# Patient Record
Sex: Male | Born: 1955 | Race: Black or African American | Hispanic: No | Smoking: Former smoker
Health system: Southern US, Community
[De-identification: ages and names within clinical notes are randomized; demographics above are authoritative.]

## PROBLEM LIST (undated history)

## (undated) DIAGNOSIS — E079 Disorder of thyroid, unspecified: Secondary | ICD-10-CM

## (undated) DIAGNOSIS — I639 Cerebral infarction, unspecified: Secondary | ICD-10-CM

## (undated) DIAGNOSIS — C801 Malignant (primary) neoplasm, unspecified: Secondary | ICD-10-CM

## (undated) DIAGNOSIS — N189 Chronic kidney disease, unspecified: Secondary | ICD-10-CM

## (undated) DIAGNOSIS — D649 Anemia, unspecified: Secondary | ICD-10-CM

## (undated) DIAGNOSIS — I10 Essential (primary) hypertension: Secondary | ICD-10-CM

## (undated) DIAGNOSIS — R569 Unspecified convulsions: Secondary | ICD-10-CM

## (undated) DIAGNOSIS — E119 Type 2 diabetes mellitus without complications: Secondary | ICD-10-CM

## (undated) DIAGNOSIS — K219 Gastro-esophageal reflux disease without esophagitis: Secondary | ICD-10-CM

## (undated) HISTORY — DX: Essential (primary) hypertension: I10

## (undated) HISTORY — DX: Chronic kidney disease, unspecified: N18.9

## (undated) HISTORY — DX: Unspecified convulsions: R56.9

## (undated) HISTORY — DX: Type 2 diabetes mellitus without complications: E11.9

## (undated) HISTORY — DX: Disorder of thyroid, unspecified: E07.9

## (undated) HISTORY — DX: Cerebral infarction, unspecified: I63.9

## (undated) HISTORY — DX: Anemia, unspecified: D64.9

---

## 2014-04-27 HISTORY — PX: INSERTION OF DIALYSIS CATHETER: SHX1324

## 2014-05-06 ENCOUNTER — Other Ambulatory Visit: Payer: Self-pay | Admitting: *Deleted

## 2014-05-06 DIAGNOSIS — Z01810 Encounter for preprocedural cardiovascular examination: Secondary | ICD-10-CM

## 2014-05-06 DIAGNOSIS — N186 End stage renal disease: Secondary | ICD-10-CM

## 2014-05-11 ENCOUNTER — Encounter: Payer: Self-pay | Admitting: Vascular Surgery

## 2014-05-12 ENCOUNTER — Ambulatory Visit (INDEPENDENT_AMBULATORY_CARE_PROVIDER_SITE_OTHER)
Admission: RE | Admit: 2014-05-12 | Discharge: 2014-05-12 | Disposition: A | Discharge status: Home / Self Care | Payer: No Typology Code available for payment source | Source: Ambulatory Visit | Attending: Vascular Surgery | Admitting: Vascular Surgery

## 2014-05-12 ENCOUNTER — Ambulatory Visit (INDEPENDENT_AMBULATORY_CARE_PROVIDER_SITE_OTHER): Payer: Medicaid Other | Admitting: Vascular Surgery

## 2014-05-12 ENCOUNTER — Ambulatory Visit (HOSPITAL_COMMUNITY)
Admission: RE | Admit: 2014-05-12 | Discharge: 2014-05-12 | Disposition: A | Discharge status: Home / Self Care | Payer: No Typology Code available for payment source | Source: Ambulatory Visit | Attending: Vascular Surgery | Admitting: Vascular Surgery

## 2014-05-12 ENCOUNTER — Encounter: Payer: Self-pay | Admitting: Vascular Surgery

## 2014-05-12 VITALS — BP 159/80 | HR 73 | Resp 16 | Ht 69.0 in | Wt 180.0 lb

## 2014-05-12 DIAGNOSIS — N186 End stage renal disease: Secondary | ICD-10-CM

## 2014-05-12 DIAGNOSIS — Z01810 Encounter for preprocedural cardiovascular examination: Secondary | ICD-10-CM

## 2014-05-12 NOTE — Progress Notes (Signed)
Subjective:     Patient ID: Jared [name], male   DOB: Jan 23, 1956, 58 y.o.   MRN: [id number]  HPI this 58-year-old male was referred by Dr. Jamal Maes for evaluation for vascular access. He is right-handed. He is on hemodialysis and has been on dialysis for about 2-3 weeks. Catheter was inserted in [place]. He has never had vascular access in the past. He does have diabetes and a history of hypertension.  Past Medical History  Diagnosis Date  . Chronic kidney disease   . Diabetes mellitus without complication   . Hypertension   . Anemia   . Thyroid disease     hyperparathyroidism    History  Substance Use Topics  . Smoking status: Former Smoker    Quit date: 05/11/1984  . Smokeless tobacco: Not on file  . Alcohol Use: Not on file    No family history on file.  No Known Allergies  Current outpatient prescriptions:amLODipine (NORVASC) 10 MG tablet, Take 10 mg by mouth daily., Disp: , Rfl: ;  hydrALAZINE (APRESOLINE) 50 MG tablet, Take 50 mg by mouth 3 (three) times daily., Disp: , Rfl: ;  Insulin NPH Human, Isophane, (NOVOLIN N ), Inject into the skin., Disp: , Rfl: ;  insulin regular (NOVOLIN R,HUMULIN R) 100 units/mL injection, Inject 30 Units into the skin 2 (two) times daily before a meal., Disp: , Rfl:  metoprolol succinate (TOPROL-XL) 100 MG 24 hr tablet, Take 100 mg by mouth 2 (two) times daily. Take with or immediately following a meal., Disp: , Rfl:   BP 159/80  Pulse 73  Resp 16  Ht 5\' 9"  (QA348G m)  Wt 180 lb (81.647 kg)  BMI 26.57 kg/m2  Body mass index is 26.57 kg/(m^2).           Review of Systems denies chest pain, dyspnea on exertion, PND, orthopnea, hemoptysis, claudication. All systems negative and complete review of systems    Objective:   Physical Exam BP 159/80  Pulse 73  Resp 16  Ht 5\' 9"  (1.753 m)  Wt 180 lb (81.647 kg)  BMI 26.57 kg/m2  Gen.-alert and oriented x3 in no apparent distress HEENT normal for age Lungs no  rhonchi or wheezing Cardiovascular regular rhythm no murmurs carotid pulses 3+ palpable no bruits audible Abdomen soft nontender no palpable masses Musculoskeletal free of  major deformities Skin clear -no rashes Neurologic normal Lower extremities 3+ femoral and dorsalis pedis pulses palpable bilaterally with no edema  Today I ordered bilateral upper extremity vein mapping which are reviewed and interpreted. Arterial system is excellent. It appears that his cephalic vein in the left arm is adequate in the upper arm and possibly adequate in the forearm for radial or brachial cephalic AV fistula     Assessment:     End-stage renal disease on hemodialysis the right IJ catheter     Plan:     We'll schedule for left upper trinity AV fistula-either radial cephalic or brachial cephalic on Thursday, June 25. Discussed this with patient and he would like to proceed

## 2014-05-13 ENCOUNTER — Other Ambulatory Visit: Payer: Self-pay | Admitting: *Deleted

## 2014-06-03 ENCOUNTER — Encounter (HOSPITAL_COMMUNITY): Payer: Self-pay | Admitting: *Deleted

## 2014-06-03 MED ORDER — DEXTROSE 5 % IV SOLN
1.5000 g | INTRAVENOUS | Status: AC
  Administered 2014-06-04: 1.5 g via INTRAVENOUS
  Filled 2014-06-03: qty 1.5

## 2014-06-03 MED ORDER — SODIUM CHLORIDE 0.9 % IV SOLN: INTRAVENOUS | Status: DC

## 2014-06-03 NOTE — Progress Notes (Signed)
Pt states he thinks he had a CXR and EKG done in past year, but unsure of name of hospital in [place], [place].

## 2014-06-04 ENCOUNTER — Ambulatory Visit (HOSPITAL_COMMUNITY): Payer: Medicaid Other

## 2014-06-04 ENCOUNTER — Encounter (HOSPITAL_COMMUNITY)
Admission: RE | Disposition: A | Discharge status: Home / Self Care | Payer: Self-pay | Source: Ambulatory Visit | Attending: Vascular Surgery

## 2014-06-04 ENCOUNTER — Ambulatory Visit (HOSPITAL_COMMUNITY): Payer: Medicaid Other | Admitting: Anesthesiology

## 2014-06-04 ENCOUNTER — Ambulatory Visit (HOSPITAL_COMMUNITY)
Admission: RE | Admit: 2014-06-04 | Discharge: 2014-06-04 | Disposition: A | Discharge status: Home / Self Care | Payer: Medicaid Other | Source: Ambulatory Visit | Attending: Vascular Surgery | Admitting: Vascular Surgery

## 2014-06-04 ENCOUNTER — Other Ambulatory Visit: Payer: Self-pay

## 2014-06-04 ENCOUNTER — Encounter (HOSPITAL_COMMUNITY): Payer: Self-pay | Admitting: Anesthesiology

## 2014-06-04 ENCOUNTER — Encounter (HOSPITAL_COMMUNITY): Payer: Medicaid Other | Admitting: Anesthesiology

## 2014-06-04 DIAGNOSIS — N185 Chronic kidney disease, stage 5: Secondary | ICD-10-CM | POA: Diagnosis not present

## 2014-06-04 DIAGNOSIS — N186 End stage renal disease: Secondary | ICD-10-CM | POA: Diagnosis not present

## 2014-06-04 DIAGNOSIS — Z87891 Personal history of nicotine dependence: Secondary | ICD-10-CM | POA: Insufficient documentation

## 2014-06-04 DIAGNOSIS — Z794 Long term (current) use of insulin: Secondary | ICD-10-CM | POA: Insufficient documentation

## 2014-06-04 DIAGNOSIS — I120 Hypertensive chronic kidney disease with stage 5 chronic kidney disease or end stage renal disease: Secondary | ICD-10-CM | POA: Insufficient documentation

## 2014-06-04 DIAGNOSIS — E119 Type 2 diabetes mellitus without complications: Secondary | ICD-10-CM | POA: Insufficient documentation

## 2014-06-04 DIAGNOSIS — Z48812 Encounter for surgical aftercare following surgery on the circulatory system: Secondary | ICD-10-CM

## 2014-06-04 DIAGNOSIS — Z992 Dependence on renal dialysis: Secondary | ICD-10-CM | POA: Insufficient documentation

## 2014-06-04 HISTORY — PX: AV FISTULA PLACEMENT: SHX1204

## 2014-06-04 LAB — POCT I-STAT 4, (NA,K, GLUC, HGB,HCT)
Glucose, Bld: 393 mg/dL — ABNORMAL HIGH (ref 70–99)
HCT: 40 % (ref 39.0–52.0)
Hemoglobin: 13.6 g/dL (ref 13.0–17.0)
Potassium: 4.9 mEq/L (ref 3.7–5.3)
SODIUM: 134 meq/L — AB (ref 137–147)

## 2014-06-04 LAB — GLUCOSE, CAPILLARY
Glucose-Capillary: 389 mg/dL — ABNORMAL HIGH (ref 70–99)
Glucose-Capillary: 389 mg/dL — ABNORMAL HIGH (ref 70–99)

## 2014-06-04 SURGERY — ARTERIOVENOUS (AV) FISTULA CREATION
Anesthesia: Monitor Anesthesia Care | Site: Arm Upper | Laterality: Left

## 2014-06-04 MED ORDER — DEXTROSE 5 % IV SOLN
INTRAVENOUS | Status: DC | PRN
  Administered 2014-06-04: 08:00:00 via INTRAVENOUS

## 2014-06-04 MED ORDER — SODIUM CHLORIDE 0.9 % IV SOLN
INTRAVENOUS | Status: DC | PRN
  Administered 2014-06-04: 07:00:00 via INTRAVENOUS

## 2014-06-04 MED ORDER — INSULIN ASPART 100 UNIT/ML ~~LOC~~ SOLN
5.0000 [IU] | Freq: Once | SUBCUTANEOUS | Status: DC
  Filled 2014-06-04: qty 0.05

## 2014-06-04 MED ORDER — METOCLOPRAMIDE HCL 5 MG/ML IJ SOLN: 10.0000 mg | Freq: Once | INTRAMUSCULAR | Status: DC | PRN

## 2014-06-04 MED ORDER — ONDANSETRON HCL 4 MG/2ML IJ SOLN
INTRAMUSCULAR | Status: AC
  Filled 2014-06-04: qty 2

## 2014-06-04 MED ORDER — OXYCODONE HCL 5 MG/5ML PO SOLN: 5.0000 mg | Freq: Once | ORAL | Status: DC | PRN

## 2014-06-04 MED ORDER — FENTANYL CITRATE 0.05 MG/ML IJ SOLN
INTRAMUSCULAR | Status: DC | PRN
  Administered 2014-06-04 (×2): 25 ug via INTRAVENOUS
  Administered 2014-06-04 (×2): 50 ug via INTRAVENOUS

## 2014-06-04 MED ORDER — MIDAZOLAM HCL 5 MG/5ML IJ SOLN
INTRAMUSCULAR | Status: DC | PRN
  Administered 2014-06-04: 1 mg via INTRAVENOUS
  Administered 2014-06-04 (×2): 0.5 mg via INTRAVENOUS

## 2014-06-04 MED ORDER — INSULIN ASPART 100 UNIT/ML ~~LOC~~ SOLN
SUBCUTANEOUS | Status: AC
  Filled 2014-06-04: qty 15

## 2014-06-04 MED ORDER — OXYCODONE HCL 5 MG PO TABS: 5.0000 mg | ORAL_TABLET | Freq: Once | ORAL | Status: DC | PRN

## 2014-06-04 MED ORDER — INSULIN ASPART 100 UNIT/ML ~~LOC~~ SOLN
15.0000 [IU] | Freq: Once | SUBCUTANEOUS | Status: AC
  Administered 2014-06-04: 15 [IU] via SUBCUTANEOUS

## 2014-06-04 MED ORDER — PROPOFOL INFUSION 10 MG/ML OPTIME
INTRAVENOUS | Status: DC | PRN
  Administered 2014-06-04: 25 ug/kg/min via INTRAVENOUS

## 2014-06-04 MED ORDER — CHLORHEXIDINE GLUCONATE CLOTH 2 % EX PADS: 6.0000 | MEDICATED_PAD | Freq: Once | CUTANEOUS | Status: DC

## 2014-06-04 MED ORDER — OXYCODONE HCL 5 MG PO TABS: 5.0000 mg | ORAL_TABLET | Freq: Four times a day (QID) | ORAL | Status: DC | PRN

## 2014-06-04 MED ORDER — SODIUM CHLORIDE 0.9 % IR SOLN
Status: DC | PRN
  Administered 2014-06-04: 08:00:00

## 2014-06-04 MED ORDER — ONDANSETRON HCL 4 MG/2ML IJ SOLN
INTRAMUSCULAR | Status: DC | PRN
  Administered 2014-06-04: 4 mg via INTRAVENOUS

## 2014-06-04 MED ORDER — LIDOCAINE-EPINEPHRINE (PF) 1 %-1:200000 IJ SOLN
INTRAMUSCULAR | Status: DC | PRN
  Administered 2014-06-04: 30 mL

## 2014-06-04 MED ORDER — 0.9 % SODIUM CHLORIDE (POUR BTL) OPTIME
TOPICAL | Status: DC | PRN
  Administered 2014-06-04: 1000 mL

## 2014-06-04 MED ORDER — FENTANYL CITRATE 0.05 MG/ML IJ SOLN
INTRAMUSCULAR | Status: AC
  Filled 2014-06-04: qty 5

## 2014-06-04 MED ORDER — FENTANYL CITRATE 0.05 MG/ML IJ SOLN
25.0000 ug | INTRAMUSCULAR | Status: DC | PRN
Start: 2014-06-04 — End: 2014-06-04

## 2014-06-04 MED ORDER — CHLORHEXIDINE GLUCONATE CLOTH 2 % EX PADS
6.0000 | MEDICATED_PAD | Freq: Once | CUTANEOUS | Status: DC
Start: 2014-06-04 — End: 2014-06-04

## 2014-06-04 MED ORDER — MIDAZOLAM HCL 2 MG/2ML IJ SOLN
INTRAMUSCULAR | Status: AC
Start: 2014-06-04 — End: 2014-06-04
  Filled 2014-06-04: qty 2

## 2014-06-04 MED ORDER — INSULIN ASPART 100 UNIT/ML ~~LOC~~ SOLN
SUBCUTANEOUS | Status: DC | PRN
  Administered 2014-06-04: 5 [IU] via SUBCUTANEOUS

## 2014-06-04 MED ORDER — PROPOFOL 10 MG/ML IV BOLUS
INTRAVENOUS | Status: AC
  Filled 2014-06-04: qty 20

## 2014-06-04 SURGICAL SUPPLY — 39 items
ADH SKN CLS APL DERMABOND .7 (GAUZE/BANDAGES/DRESSINGS) ×1
ARMBAND PINK RESTRICT EXTREMIT (MISCELLANEOUS) ×3 IMPLANT
BLADE 10 SAFETY STRL DISP (BLADE) ×3 IMPLANT
CANISTER SUCTION 2500CC (MISCELLANEOUS) ×3 IMPLANT
CLIP TI MEDIUM 6 (CLIP) ×3 IMPLANT
CLIP TI WIDE RED SMALL 6 (CLIP) ×3 IMPLANT
COVER PROBE W GEL 5X96 (DRAPES) ×3 IMPLANT
COVER SURGICAL LIGHT HANDLE (MISCELLANEOUS) ×3 IMPLANT
DERMABOND ADVANCED (GAUZE/BANDAGES/DRESSINGS) ×2
DERMABOND ADVANCED .7 DNX12 (GAUZE/BANDAGES/DRESSINGS) ×1 IMPLANT
DRAIN PENROSE 1/4X12 LTX STRL (WOUND CARE) ×3 IMPLANT
ELECT REM PT RETURN 9FT ADLT (ELECTROSURGICAL) ×3
ELECTRODE REM PT RTRN 9FT ADLT (ELECTROSURGICAL) ×1 IMPLANT
GEL ULTRASOUND 20GR AQUASONIC (MISCELLANEOUS) IMPLANT
GLOVE BIO SURGEON STRL SZ 6.5 (GLOVE) ×2 IMPLANT
GLOVE BIO SURGEONS STRL SZ 6.5 (GLOVE) ×1
GLOVE BIOGEL PI IND STRL 6.5 (GLOVE) ×1 IMPLANT
GLOVE BIOGEL PI INDICATOR 6.5 (GLOVE) ×2
GLOVE SS BIOGEL STRL SZ 7 (GLOVE) ×1 IMPLANT
GLOVE SUPERSENSE BIOGEL SZ 7 (GLOVE) ×2
GLOVE SURG SS PI 6.5 STRL IVOR (GLOVE) ×3 IMPLANT
GLOVE SURG SS PI 7.0 STRL IVOR (GLOVE) ×6 IMPLANT
GOWN SPEC L3 MED W/TWL (GOWN DISPOSABLE) ×3 IMPLANT
GOWN STRL REUS W/ TWL LRG LVL3 (GOWN DISPOSABLE) ×3 IMPLANT
GOWN STRL REUS W/TWL LRG LVL3 (GOWN DISPOSABLE) ×9
KIT BASIN OR (CUSTOM PROCEDURE TRAY) ×3 IMPLANT
KIT ROOM TURNOVER OR (KITS) ×3 IMPLANT
NS IRRIG 1000ML POUR BTL (IV SOLUTION) ×3 IMPLANT
PACK CV ACCESS (CUSTOM PROCEDURE TRAY) ×3 IMPLANT
PAD ARMBOARD 7.5X6 YLW CONV (MISCELLANEOUS) ×6 IMPLANT
PROBE PENCIL 8 MHZ STRL DISP (MISCELLANEOUS) ×3 IMPLANT
SUT PROLENE 6 0 BV (SUTURE) ×3 IMPLANT
SUT VIC AB 3-0 SH 27 (SUTURE) ×6
SUT VIC AB 3-0 SH 27X BRD (SUTURE) ×2 IMPLANT
SUT VICRYL 4-0 PS2 18IN ABS (SUTURE) ×3 IMPLANT
TOWEL OR 17X24 6PK STRL BLUE (TOWEL DISPOSABLE) ×3 IMPLANT
TOWEL OR 17X26 10 PK STRL BLUE (TOWEL DISPOSABLE) ×3 IMPLANT
UNDERPAD 30X30 INCONTINENT (UNDERPADS AND DIAPERS) ×3 IMPLANT
WATER STERILE IRR 1000ML POUR (IV SOLUTION) ×3 IMPLANT

## 2014-06-04 NOTE — Anesthesia Postprocedure Evaluation (Signed)
Anesthesia Post Note  Patient: Jared [name]  Procedure(s) Performed: Procedure(s) (LRB): ARTERIOVENOUS (AV) FISTULA CREATION-  BRACHIOCEPHALIC WITH LIGATION OF COMPETEING BRANCH (Left)  Anesthesia type: MAC  Patient location: PACU  Post pain: Pain level controlled  Post assessment: Patient's Cardiovascular Status Stable  Last Vitals:  Filed Vitals:   06/04/14 1005  BP: 151/73  Pulse: 60  Temp:   Resp: 15    Post vital signs: Reviewed and stable  Level of consciousness: alert  Complications: No apparent anesthesia complications

## 2014-06-04 NOTE — H&P (View-Only) (Signed)
Subjective:     Patient ID: Margit Banda, male   DOB: [birth year], 58 y.o.   MRN: [id number]  HPI this 58-year-old male was referred by Dr. Jamal Maes for evaluation for vascular access. He is right-handed. He is on hemodialysis and has been on dialysis for about 2-3 weeks. Catheter was inserted in [place]. He has never had vascular access in the past. He does have diabetes and a history of hypertension.  Past Medical History  Diagnosis Date  . Chronic kidney disease   . Diabetes mellitus without complication   . Hypertension   . Anemia   . Thyroid disease     hyperparathyroidism    History  Substance Use Topics  . Smoking status: Former Smoker    Quit date: 05/11/1984  . Smokeless tobacco: Not on file  . Alcohol Use: Not on file    No family history on file.  No Known Allergies  Current outpatient prescriptions:amLODipine (NORVASC) 10 MG tablet, Take 10 mg by mouth daily., Disp: , Rfl: ;  hydrALAZINE (APRESOLINE) 50 MG tablet, Take 50 mg by mouth 3 (three) times daily., Disp: , Rfl: ;  Insulin NPH Human, Isophane, (NOVOLIN N Ken Caryl), Inject into the skin., Disp: , Rfl: ;  insulin regular (NOVOLIN R,HUMULIN R) 100 units/mL injection, Inject 30 Units into the skin 2 (two) times daily before a meal., Disp: , Rfl:  metoprolol succinate (TOPROL-XL) 100 MG 24 hr tablet, Take 100 mg by mouth 2 (two) times daily. Take with or immediately following a meal., Disp: , Rfl:   BP 159/80  Pulse 73  Resp 16  Ht 5\' 9"  (1.753 m)  Wt 180 lb (81.647 kg)  BMI 26.57 kg/m2  Body mass index is 26.57 kg/(m^2).           Review of Systems denies chest pain, dyspnea on exertion, PND, orthopnea, hemoptysis, claudication. All systems negative and complete review of systems    Objective:   Physical Exam BP 159/80  Pulse 73  Resp 16  Ht 5\' 9"  (1.753 m)  Wt 180 lb (81.647 kg)  BMI 26.57 kg/m2  Gen.-alert and oriented x3 in no apparent distress HEENT normal for age Lungs no  rhonchi or wheezing Cardiovascular regular rhythm no murmurs carotid pulses 3+ palpable no bruits audible Abdomen soft nontender no palpable masses Musculoskeletal free of  major deformities Skin clear -no rashes Neurologic normal Lower extremities 3+ femoral and dorsalis pedis pulses palpable bilaterally with no edema  Today I ordered bilateral upper extremity vein mapping which are reviewed and interpreted. Arterial system is excellent. It appears that his cephalic vein in the left arm is adequate in the upper arm and possibly adequate in the forearm for radial or brachial cephalic AV fistula     Assessment:     End-stage renal disease on hemodialysis the right IJ catheter     Plan:     We'll schedule for left upper trinity AV fistula-either radial cephalic or brachial cephalic on Thursday, June 25. Discussed this with patient and he would like to proceed

## 2014-06-04 NOTE — Op Note (Signed)
OPERATIVE REPORT  Date of Surgery: 06/04/2014  Surgeon: Tinnie Gens, MD  Assistant: Dionicio Stall  Pre-op Diagnosis:  End stage renal disease  Post-op Diagnosis: End stage renal disease  Procedure: Procedure(s): ARTERIOVENOUS (AV) FISTULA CREATION-  BRACHIOCEPHALIC WITH LIGATION OF COMPETEING BRANCH-left  Anesthesia: MAC  EBL: Minimal  Complications: None  Procedure Details: Patient was taken operating room placed in supine position at which time left upper extremity was prepped with Betadine scrub and solution draped in routine sterile manner. The cephalic vein was then imaged using the SonoSite ultrasound from the wrist to the mid upper arm. The vein was thickened in the forearm did not appear to adequate for fistula creation. Therefore plan was made to create a brachial-cephalic AV fistula. After infiltration of 1% Xylocaine with epinephrine a transverse incision was made just distal to the antecubital area antecubital vein dissected free. The cephalic vein was approximately 3-3-1/2 mm in size. Ligated distally after mobilizing it. It was gently dilated with heparinized saline and was an adequate vein. There was a significant competing branch noted in the mid upper arm after gently dilating this with heparin saline and it was marked for later ligation. Brachial artery was then exposed and the fashion encircled vessel loops. An excellent pulse. Sclerae proximally and distally a 15 blade extended with Potts scissors. There was excellent inflow. Vein was carefully measured spatulated and anastomosed end to side with 6-0 Prolene. Brachial artery was then opened and there was an excellent pulse and thrill in the fistula. The branch was visualized with the ultrasound and after infiltration of 1% Xylocaine a short incision made in the mid upper arm branch easily identified ligated with 3-0 silk tie. Adequate hemostasis was achieved both wounds closed in layers with Vicryl in subcuticular fashion  and Dermabond patient to the recovery room in stable condition   Tinnie Gens, MD 06/04/2014 9:07 AM

## 2014-06-04 NOTE — Transfer of Care (Signed)
Immediate Anesthesia Transfer of Care Note  Patient: Jared [name]  Procedure(s) Performed: Procedure(s): ARTERIOVENOUS (AV) FISTULA CREATION-  BRACHIOCEPHALIC WITH LIGATION OF COMPETEING BRANCH (Left)  Patient Location: PACU  Anesthesia Type:MAC  Level of Consciousness: awake, alert , oriented and patient cooperative  Airway & Oxygen Therapy: Patient Spontanous Breathing and Patient connected to nasal cannula oxygen  Post-op Assessment: Report given to PACU RN, Post -op Vital signs reviewed and stable and Patient moving all extremities  Post vital signs: Reviewed and stable  Complications: No apparent anesthesia complications

## 2014-06-04 NOTE — Anesthesia Preprocedure Evaluation (Signed)
Anesthesia Evaluation  Patient identified by MRN, date of birth, ID band Patient awake    Reviewed: Allergy & Precautions, H&P , NPO status , Patient's Chart, lab work & pertinent test results, reviewed documented beta blocker date and time   Airway Mallampati: II TM Distance: >3 FB Neck ROM: full    Dental   Pulmonary neg pulmonary ROS, former smoker,  breath sounds clear to auscultation        Cardiovascular hypertension, On Medications and On Home Beta Blockers Rhythm:regular     Neuro/Psych negative neurological ROS  negative psych ROS   GI/Hepatic negative GI ROS, Neg liver ROS,   Endo/Other  diabetes, Insulin Dependent, Oral Hypoglycemic Agents  Renal/GU ESRF and DialysisRenal disease  negative genitourinary   Musculoskeletal   Abdominal   Peds  Hematology  (+) anemia ,   Anesthesia Other Findings See surgeon's H&P   Reproductive/Obstetrics negative OB ROS                           Anesthesia Physical Anesthesia Plan  ASA: III  Anesthesia Plan: MAC   Post-op Pain Management:    Induction: Intravenous  Airway Management Planned: Simple Face Mask  Additional Equipment:   Intra-op Plan:   Post-operative Plan:   Informed Consent: I have reviewed the patients History and Physical, chart, labs and discussed the procedure including the risks, benefits and alternatives for the proposed anesthesia with the patient or authorized representative who has indicated his/her understanding and acceptance.   Dental Advisory Given  Plan Discussed with: CRNA and Surgeon  Anesthesia Plan Comments:         Anesthesia Quick Evaluation

## 2014-06-04 NOTE — Progress Notes (Signed)
CBG 393 called to Dr Girard Cooter.

## 2014-06-04 NOTE — Progress Notes (Signed)
Helene Kelp RN at bedside to get report from CRNA

## 2014-06-04 NOTE — Discharge Instructions (Signed)
° ° °  06/04/2014 Jared [name] [id number] 1956-03-23  Surgeon(s): Mal Misty, MD  Procedure(s): ARTERIOVENOUS (AV) FISTULA CREATION-  BRACHIOCEPHALIC WITH LIGATION OF COMPETEING BRANCH  x Do not stick graft for 12 weeks   What to eat:  For your first meals, you should eat lightly; only small meals initially.  If you do not have nausea, you may eat larger meals.  Avoid spicy, greasy and heavy food.    General Anesthesia, Adult, Care After  Refer to this sheet in the next few weeks. These instructions provide you with information on caring for yourself after your procedure. Your health care provider may also give you more specific instructions. Your treatment has been planned according to current medical practices, but problems sometimes occur. Call your health care provider if you have any problems or questions after your procedure.  WHAT TO EXPECT AFTER THE PROCEDURE  After the procedure, it is typical to experience:  Sleepiness.  Nausea and vomiting. HOME CARE INSTRUCTIONS  For the first 24 hours after general anesthesia:  Have a responsible person with you.  Do not drive a car. If you are alone, do not take public transportation.  Do not drink alcohol.  Do not take medicine that has not been prescribed by your health care provider.  Do not sign important papers or make important decisions.  You may resume a normal diet and activities as directed by your health care provider.  Change bandages (dressings) as directed.  If you have questions or problems that seem related to general anesthesia, call the hospital and ask for the anesthetist or anesthesiologist on call. SEEK MEDICAL CARE IF:  You have nausea and vomiting that continue the day after anesthesia.  You develop a rash. SEEK IMMEDIATE MEDICAL CARE IF:  You have difficulty breathing.  You have chest pain.  You have any allergic problems. Document Released: 02/19/2001 Document Revised: 07/16/2013 Document Reviewed:  05/29/2013  [facility] Patient Information 2014 [place], [place].

## 2014-06-04 NOTE — Interval H&P Note (Signed)
History and Physical Interval Note:  06/04/2014 7:24 AM  Jared [name]  has presented today for surgery, with the diagnosis of  End stage renal disease  The various methods of treatment have been discussed with the patient and family. After consideration of risks, benefits and other options for treatment, the patient has consented to  Procedure(s): ARTERIOVENOUS (AV) FISTULA CREATION- RADIOCEPHALIC VS BRACHIOCEPHALIC (Left) as a surgical intervention .  The patient's history has been reviewed, patient examined, no change in status, stable for surgery.  I have reviewed the patient's chart and labs.  Questions were answered to the patient's satisfaction.     Tinnie Gens

## 2014-06-04 NOTE — Addendum Note (Signed)
Addendum created 06/04/14 1043 by Terrill Mohr, CRNA   Modules edited: Anesthesia LDA

## 2014-06-05 ENCOUNTER — Telehealth: Payer: Self-pay | Admitting: Vascular Surgery

## 2014-06-05 ENCOUNTER — Encounter (HOSPITAL_COMMUNITY): Payer: Self-pay | Admitting: Vascular Surgery

## 2014-06-05 NOTE — Telephone Encounter (Addendum)
Message copied by Gena Fray on Fri Jun 05, 2014  2:28 PM ------      Message from: Denman George      Created: Thu Jun 04, 2014  9:19 AM      Regarding: Steph log; and needs vasc lab + MD appt @ 6 wks.                   ----- Message -----         From: Gabriel Earing, PA-C         Sent: 06/04/2014   8:46 AM           To: [facility] Charge Pool            S/p left brachiocephalic AVF [id number].  F/u with Dr. Kellie Simmering in 6 weeks with duplex.            Thanks      Samantha ------  06/05/14: lm for pt re appt, dpm

## 2014-07-20 ENCOUNTER — Encounter: Payer: Self-pay | Admitting: Vascular Surgery

## 2014-07-21 ENCOUNTER — Encounter: Payer: No Typology Code available for payment source | Admitting: Vascular Surgery

## 2014-07-21 ENCOUNTER — Other Ambulatory Visit (HOSPITAL_COMMUNITY): Payer: No Typology Code available for payment source

## 2014-11-28 DIAGNOSIS — E1129 Type 2 diabetes mellitus with other diabetic kidney complication: Secondary | ICD-10-CM | POA: Diagnosis not present

## 2014-11-28 DIAGNOSIS — D631 Anemia in chronic kidney disease: Secondary | ICD-10-CM | POA: Diagnosis not present

## 2014-11-28 DIAGNOSIS — D509 Iron deficiency anemia, unspecified: Secondary | ICD-10-CM | POA: Diagnosis not present

## 2014-11-28 DIAGNOSIS — N186 End stage renal disease: Secondary | ICD-10-CM | POA: Diagnosis not present

## 2014-11-28 DIAGNOSIS — N2581 Secondary hyperparathyroidism of renal origin: Secondary | ICD-10-CM | POA: Diagnosis not present

## 2014-11-30 DIAGNOSIS — E1129 Type 2 diabetes mellitus with other diabetic kidney complication: Secondary | ICD-10-CM | POA: Diagnosis not present

## 2014-11-30 DIAGNOSIS — N186 End stage renal disease: Secondary | ICD-10-CM | POA: Diagnosis not present

## 2014-11-30 DIAGNOSIS — N2581 Secondary hyperparathyroidism of renal origin: Secondary | ICD-10-CM | POA: Diagnosis not present

## 2014-11-30 DIAGNOSIS — D631 Anemia in chronic kidney disease: Secondary | ICD-10-CM | POA: Diagnosis not present

## 2014-11-30 DIAGNOSIS — D509 Iron deficiency anemia, unspecified: Secondary | ICD-10-CM | POA: Diagnosis not present

## 2014-12-01 DIAGNOSIS — Z992 Dependence on renal dialysis: Secondary | ICD-10-CM | POA: Diagnosis not present

## 2014-12-01 DIAGNOSIS — N19 Unspecified kidney failure: Secondary | ICD-10-CM | POA: Diagnosis not present

## 2014-12-01 DIAGNOSIS — N186 End stage renal disease: Secondary | ICD-10-CM | POA: Diagnosis not present

## 2014-12-01 DIAGNOSIS — E109 Type 1 diabetes mellitus without complications: Secondary | ICD-10-CM | POA: Diagnosis not present

## 2014-12-01 DIAGNOSIS — Z794 Long term (current) use of insulin: Secondary | ICD-10-CM | POA: Diagnosis not present

## 2014-12-01 DIAGNOSIS — I120 Hypertensive chronic kidney disease with stage 5 chronic kidney disease or end stage renal disease: Secondary | ICD-10-CM | POA: Diagnosis not present

## 2014-12-02 DIAGNOSIS — N2581 Secondary hyperparathyroidism of renal origin: Secondary | ICD-10-CM | POA: Diagnosis not present

## 2014-12-02 DIAGNOSIS — E1129 Type 2 diabetes mellitus with other diabetic kidney complication: Secondary | ICD-10-CM | POA: Diagnosis not present

## 2014-12-02 DIAGNOSIS — D509 Iron deficiency anemia, unspecified: Secondary | ICD-10-CM | POA: Diagnosis not present

## 2014-12-02 DIAGNOSIS — D631 Anemia in chronic kidney disease: Secondary | ICD-10-CM | POA: Diagnosis not present

## 2014-12-02 DIAGNOSIS — N186 End stage renal disease: Secondary | ICD-10-CM | POA: Diagnosis not present

## 2014-12-04 DIAGNOSIS — D509 Iron deficiency anemia, unspecified: Secondary | ICD-10-CM | POA: Diagnosis not present

## 2014-12-04 DIAGNOSIS — N186 End stage renal disease: Secondary | ICD-10-CM | POA: Diagnosis not present

## 2014-12-04 DIAGNOSIS — E1129 Type 2 diabetes mellitus with other diabetic kidney complication: Secondary | ICD-10-CM | POA: Diagnosis not present

## 2014-12-04 DIAGNOSIS — N2581 Secondary hyperparathyroidism of renal origin: Secondary | ICD-10-CM | POA: Diagnosis not present

## 2014-12-04 DIAGNOSIS — D631 Anemia in chronic kidney disease: Secondary | ICD-10-CM | POA: Diagnosis not present

## 2014-12-07 DIAGNOSIS — N186 End stage renal disease: Secondary | ICD-10-CM | POA: Diagnosis not present

## 2014-12-07 DIAGNOSIS — E1129 Type 2 diabetes mellitus with other diabetic kidney complication: Secondary | ICD-10-CM | POA: Diagnosis not present

## 2014-12-07 DIAGNOSIS — D631 Anemia in chronic kidney disease: Secondary | ICD-10-CM | POA: Diagnosis not present

## 2014-12-07 DIAGNOSIS — D509 Iron deficiency anemia, unspecified: Secondary | ICD-10-CM | POA: Diagnosis not present

## 2014-12-07 DIAGNOSIS — N2581 Secondary hyperparathyroidism of renal origin: Secondary | ICD-10-CM | POA: Diagnosis not present

## 2014-12-09 DIAGNOSIS — D631 Anemia in chronic kidney disease: Secondary | ICD-10-CM | POA: Diagnosis not present

## 2014-12-09 DIAGNOSIS — N2581 Secondary hyperparathyroidism of renal origin: Secondary | ICD-10-CM | POA: Diagnosis not present

## 2014-12-09 DIAGNOSIS — N186 End stage renal disease: Secondary | ICD-10-CM | POA: Diagnosis not present

## 2014-12-09 DIAGNOSIS — D509 Iron deficiency anemia, unspecified: Secondary | ICD-10-CM | POA: Diagnosis not present

## 2014-12-09 DIAGNOSIS — E1129 Type 2 diabetes mellitus with other diabetic kidney complication: Secondary | ICD-10-CM | POA: Diagnosis not present

## 2014-12-11 DIAGNOSIS — D509 Iron deficiency anemia, unspecified: Secondary | ICD-10-CM | POA: Diagnosis not present

## 2014-12-11 DIAGNOSIS — N2581 Secondary hyperparathyroidism of renal origin: Secondary | ICD-10-CM | POA: Diagnosis not present

## 2014-12-11 DIAGNOSIS — E1129 Type 2 diabetes mellitus with other diabetic kidney complication: Secondary | ICD-10-CM | POA: Diagnosis not present

## 2014-12-11 DIAGNOSIS — D631 Anemia in chronic kidney disease: Secondary | ICD-10-CM | POA: Diagnosis not present

## 2014-12-11 DIAGNOSIS — N186 End stage renal disease: Secondary | ICD-10-CM | POA: Diagnosis not present

## 2014-12-14 DIAGNOSIS — E1129 Type 2 diabetes mellitus with other diabetic kidney complication: Secondary | ICD-10-CM | POA: Diagnosis not present

## 2014-12-14 DIAGNOSIS — N2581 Secondary hyperparathyroidism of renal origin: Secondary | ICD-10-CM | POA: Diagnosis not present

## 2014-12-14 DIAGNOSIS — N186 End stage renal disease: Secondary | ICD-10-CM | POA: Diagnosis not present

## 2014-12-14 DIAGNOSIS — D631 Anemia in chronic kidney disease: Secondary | ICD-10-CM | POA: Diagnosis not present

## 2014-12-14 DIAGNOSIS — D509 Iron deficiency anemia, unspecified: Secondary | ICD-10-CM | POA: Diagnosis not present

## 2014-12-16 DIAGNOSIS — N2581 Secondary hyperparathyroidism of renal origin: Secondary | ICD-10-CM | POA: Diagnosis not present

## 2014-12-16 DIAGNOSIS — N186 End stage renal disease: Secondary | ICD-10-CM | POA: Diagnosis not present

## 2014-12-16 DIAGNOSIS — D631 Anemia in chronic kidney disease: Secondary | ICD-10-CM | POA: Diagnosis not present

## 2014-12-16 DIAGNOSIS — D509 Iron deficiency anemia, unspecified: Secondary | ICD-10-CM | POA: Diagnosis not present

## 2014-12-16 DIAGNOSIS — E1129 Type 2 diabetes mellitus with other diabetic kidney complication: Secondary | ICD-10-CM | POA: Diagnosis not present

## 2014-12-18 DIAGNOSIS — D509 Iron deficiency anemia, unspecified: Secondary | ICD-10-CM | POA: Diagnosis not present

## 2014-12-18 DIAGNOSIS — D631 Anemia in chronic kidney disease: Secondary | ICD-10-CM | POA: Diagnosis not present

## 2014-12-18 DIAGNOSIS — E1129 Type 2 diabetes mellitus with other diabetic kidney complication: Secondary | ICD-10-CM | POA: Diagnosis not present

## 2014-12-18 DIAGNOSIS — N2581 Secondary hyperparathyroidism of renal origin: Secondary | ICD-10-CM | POA: Diagnosis not present

## 2014-12-18 DIAGNOSIS — N186 End stage renal disease: Secondary | ICD-10-CM | POA: Diagnosis not present

## 2014-12-21 DIAGNOSIS — D631 Anemia in chronic kidney disease: Secondary | ICD-10-CM | POA: Diagnosis not present

## 2014-12-21 DIAGNOSIS — N2581 Secondary hyperparathyroidism of renal origin: Secondary | ICD-10-CM | POA: Diagnosis not present

## 2014-12-21 DIAGNOSIS — D509 Iron deficiency anemia, unspecified: Secondary | ICD-10-CM | POA: Diagnosis not present

## 2014-12-21 DIAGNOSIS — N186 End stage renal disease: Secondary | ICD-10-CM | POA: Diagnosis not present

## 2014-12-21 DIAGNOSIS — E1129 Type 2 diabetes mellitus with other diabetic kidney complication: Secondary | ICD-10-CM | POA: Diagnosis not present

## 2014-12-23 DIAGNOSIS — N2581 Secondary hyperparathyroidism of renal origin: Secondary | ICD-10-CM | POA: Diagnosis not present

## 2014-12-23 DIAGNOSIS — N186 End stage renal disease: Secondary | ICD-10-CM | POA: Diagnosis not present

## 2014-12-23 DIAGNOSIS — D631 Anemia in chronic kidney disease: Secondary | ICD-10-CM | POA: Diagnosis not present

## 2014-12-23 DIAGNOSIS — D509 Iron deficiency anemia, unspecified: Secondary | ICD-10-CM | POA: Diagnosis not present

## 2014-12-23 DIAGNOSIS — E1129 Type 2 diabetes mellitus with other diabetic kidney complication: Secondary | ICD-10-CM | POA: Diagnosis not present

## 2014-12-25 DIAGNOSIS — D509 Iron deficiency anemia, unspecified: Secondary | ICD-10-CM | POA: Diagnosis not present

## 2014-12-25 DIAGNOSIS — E1129 Type 2 diabetes mellitus with other diabetic kidney complication: Secondary | ICD-10-CM | POA: Diagnosis not present

## 2014-12-25 DIAGNOSIS — D631 Anemia in chronic kidney disease: Secondary | ICD-10-CM | POA: Diagnosis not present

## 2014-12-25 DIAGNOSIS — N186 End stage renal disease: Secondary | ICD-10-CM | POA: Diagnosis not present

## 2014-12-25 DIAGNOSIS — N2581 Secondary hyperparathyroidism of renal origin: Secondary | ICD-10-CM | POA: Diagnosis not present

## 2014-12-27 DIAGNOSIS — N186 End stage renal disease: Secondary | ICD-10-CM | POA: Diagnosis not present

## 2014-12-27 DIAGNOSIS — Z992 Dependence on renal dialysis: Secondary | ICD-10-CM | POA: Diagnosis not present

## 2014-12-28 DIAGNOSIS — N186 End stage renal disease: Secondary | ICD-10-CM | POA: Diagnosis not present

## 2014-12-28 DIAGNOSIS — D509 Iron deficiency anemia, unspecified: Secondary | ICD-10-CM | POA: Diagnosis not present

## 2014-12-28 DIAGNOSIS — N2581 Secondary hyperparathyroidism of renal origin: Secondary | ICD-10-CM | POA: Diagnosis not present

## 2014-12-29 DIAGNOSIS — E440 Moderate protein-calorie malnutrition: Secondary | ICD-10-CM | POA: Diagnosis not present

## 2014-12-29 DIAGNOSIS — D509 Iron deficiency anemia, unspecified: Secondary | ICD-10-CM | POA: Diagnosis not present

## 2014-12-29 DIAGNOSIS — Z4932 Encounter for adequacy testing for peritoneal dialysis: Secondary | ICD-10-CM | POA: Diagnosis not present

## 2014-12-29 DIAGNOSIS — K769 Liver disease, unspecified: Secondary | ICD-10-CM | POA: Diagnosis not present

## 2014-12-29 DIAGNOSIS — E784 Other hyperlipidemia: Secondary | ICD-10-CM | POA: Diagnosis not present

## 2014-12-29 DIAGNOSIS — D513 Other dietary vitamin B12 deficiency anemia: Secondary | ICD-10-CM | POA: Diagnosis not present

## 2014-12-29 DIAGNOSIS — N2581 Secondary hyperparathyroidism of renal origin: Secondary | ICD-10-CM | POA: Diagnosis not present

## 2014-12-29 DIAGNOSIS — N186 End stage renal disease: Secondary | ICD-10-CM | POA: Diagnosis not present

## 2014-12-29 DIAGNOSIS — D631 Anemia in chronic kidney disease: Secondary | ICD-10-CM | POA: Diagnosis not present

## 2014-12-29 DIAGNOSIS — Z23 Encounter for immunization: Secondary | ICD-10-CM | POA: Diagnosis not present

## 2014-12-29 DIAGNOSIS — R8299 Other abnormal findings in urine: Secondary | ICD-10-CM | POA: Diagnosis not present

## 2014-12-29 DIAGNOSIS — N2589 Other disorders resulting from impaired renal tubular function: Secondary | ICD-10-CM | POA: Diagnosis not present

## 2014-12-30 DIAGNOSIS — Z23 Encounter for immunization: Secondary | ICD-10-CM | POA: Diagnosis not present

## 2014-12-30 DIAGNOSIS — N186 End stage renal disease: Secondary | ICD-10-CM | POA: Diagnosis not present

## 2014-12-30 DIAGNOSIS — K769 Liver disease, unspecified: Secondary | ICD-10-CM | POA: Diagnosis not present

## 2014-12-30 DIAGNOSIS — D509 Iron deficiency anemia, unspecified: Secondary | ICD-10-CM | POA: Diagnosis not present

## 2014-12-30 DIAGNOSIS — E440 Moderate protein-calorie malnutrition: Secondary | ICD-10-CM | POA: Diagnosis not present

## 2014-12-30 DIAGNOSIS — D513 Other dietary vitamin B12 deficiency anemia: Secondary | ICD-10-CM | POA: Diagnosis not present

## 2014-12-31 DIAGNOSIS — D513 Other dietary vitamin B12 deficiency anemia: Secondary | ICD-10-CM | POA: Diagnosis not present

## 2014-12-31 DIAGNOSIS — D509 Iron deficiency anemia, unspecified: Secondary | ICD-10-CM | POA: Diagnosis not present

## 2014-12-31 DIAGNOSIS — Z23 Encounter for immunization: Secondary | ICD-10-CM | POA: Diagnosis not present

## 2014-12-31 DIAGNOSIS — N186 End stage renal disease: Secondary | ICD-10-CM | POA: Diagnosis not present

## 2014-12-31 DIAGNOSIS — K769 Liver disease, unspecified: Secondary | ICD-10-CM | POA: Diagnosis not present

## 2014-12-31 DIAGNOSIS — E440 Moderate protein-calorie malnutrition: Secondary | ICD-10-CM | POA: Diagnosis not present

## 2015-01-01 DIAGNOSIS — D509 Iron deficiency anemia, unspecified: Secondary | ICD-10-CM | POA: Diagnosis not present

## 2015-01-01 DIAGNOSIS — Z23 Encounter for immunization: Secondary | ICD-10-CM | POA: Diagnosis not present

## 2015-01-01 DIAGNOSIS — E440 Moderate protein-calorie malnutrition: Secondary | ICD-10-CM | POA: Diagnosis not present

## 2015-01-01 DIAGNOSIS — N186 End stage renal disease: Secondary | ICD-10-CM | POA: Diagnosis not present

## 2015-01-01 DIAGNOSIS — D513 Other dietary vitamin B12 deficiency anemia: Secondary | ICD-10-CM | POA: Diagnosis not present

## 2015-01-01 DIAGNOSIS — K769 Liver disease, unspecified: Secondary | ICD-10-CM | POA: Diagnosis not present

## 2015-01-04 DIAGNOSIS — N186 End stage renal disease: Secondary | ICD-10-CM | POA: Diagnosis not present

## 2015-01-04 DIAGNOSIS — D509 Iron deficiency anemia, unspecified: Secondary | ICD-10-CM | POA: Diagnosis not present

## 2015-01-04 DIAGNOSIS — Z23 Encounter for immunization: Secondary | ICD-10-CM | POA: Diagnosis not present

## 2015-01-04 DIAGNOSIS — D513 Other dietary vitamin B12 deficiency anemia: Secondary | ICD-10-CM | POA: Diagnosis not present

## 2015-01-04 DIAGNOSIS — E440 Moderate protein-calorie malnutrition: Secondary | ICD-10-CM | POA: Diagnosis not present

## 2015-01-04 DIAGNOSIS — K769 Liver disease, unspecified: Secondary | ICD-10-CM | POA: Diagnosis not present

## 2015-01-05 DIAGNOSIS — Z23 Encounter for immunization: Secondary | ICD-10-CM | POA: Diagnosis not present

## 2015-01-05 DIAGNOSIS — D513 Other dietary vitamin B12 deficiency anemia: Secondary | ICD-10-CM | POA: Diagnosis not present

## 2015-01-05 DIAGNOSIS — K769 Liver disease, unspecified: Secondary | ICD-10-CM | POA: Diagnosis not present

## 2015-01-05 DIAGNOSIS — D509 Iron deficiency anemia, unspecified: Secondary | ICD-10-CM | POA: Diagnosis not present

## 2015-01-05 DIAGNOSIS — E440 Moderate protein-calorie malnutrition: Secondary | ICD-10-CM | POA: Diagnosis not present

## 2015-01-05 DIAGNOSIS — N186 End stage renal disease: Secondary | ICD-10-CM | POA: Diagnosis not present

## 2015-01-06 DIAGNOSIS — Z23 Encounter for immunization: Secondary | ICD-10-CM | POA: Diagnosis not present

## 2015-01-06 DIAGNOSIS — D513 Other dietary vitamin B12 deficiency anemia: Secondary | ICD-10-CM | POA: Diagnosis not present

## 2015-01-06 DIAGNOSIS — K769 Liver disease, unspecified: Secondary | ICD-10-CM | POA: Diagnosis not present

## 2015-01-06 DIAGNOSIS — E440 Moderate protein-calorie malnutrition: Secondary | ICD-10-CM | POA: Diagnosis not present

## 2015-01-06 DIAGNOSIS — D509 Iron deficiency anemia, unspecified: Secondary | ICD-10-CM | POA: Diagnosis not present

## 2015-01-06 DIAGNOSIS — N186 End stage renal disease: Secondary | ICD-10-CM | POA: Diagnosis not present

## 2015-01-07 DIAGNOSIS — K769 Liver disease, unspecified: Secondary | ICD-10-CM | POA: Diagnosis not present

## 2015-01-07 DIAGNOSIS — D509 Iron deficiency anemia, unspecified: Secondary | ICD-10-CM | POA: Diagnosis not present

## 2015-01-07 DIAGNOSIS — Z23 Encounter for immunization: Secondary | ICD-10-CM | POA: Diagnosis not present

## 2015-01-07 DIAGNOSIS — E440 Moderate protein-calorie malnutrition: Secondary | ICD-10-CM | POA: Diagnosis not present

## 2015-01-07 DIAGNOSIS — N186 End stage renal disease: Secondary | ICD-10-CM | POA: Diagnosis not present

## 2015-01-07 DIAGNOSIS — D513 Other dietary vitamin B12 deficiency anemia: Secondary | ICD-10-CM | POA: Diagnosis not present

## 2015-01-08 DIAGNOSIS — D509 Iron deficiency anemia, unspecified: Secondary | ICD-10-CM | POA: Diagnosis not present

## 2015-01-08 DIAGNOSIS — N186 End stage renal disease: Secondary | ICD-10-CM | POA: Diagnosis not present

## 2015-01-08 DIAGNOSIS — E440 Moderate protein-calorie malnutrition: Secondary | ICD-10-CM | POA: Diagnosis not present

## 2015-01-08 DIAGNOSIS — K769 Liver disease, unspecified: Secondary | ICD-10-CM | POA: Diagnosis not present

## 2015-01-08 DIAGNOSIS — Z23 Encounter for immunization: Secondary | ICD-10-CM | POA: Diagnosis not present

## 2015-01-08 DIAGNOSIS — D513 Other dietary vitamin B12 deficiency anemia: Secondary | ICD-10-CM | POA: Diagnosis not present

## 2015-01-09 DIAGNOSIS — N186 End stage renal disease: Secondary | ICD-10-CM | POA: Diagnosis not present

## 2015-01-09 DIAGNOSIS — D631 Anemia in chronic kidney disease: Secondary | ICD-10-CM | POA: Diagnosis not present

## 2015-01-09 DIAGNOSIS — Z4932 Encounter for adequacy testing for peritoneal dialysis: Secondary | ICD-10-CM | POA: Diagnosis not present

## 2015-01-10 DIAGNOSIS — Z4932 Encounter for adequacy testing for peritoneal dialysis: Secondary | ICD-10-CM | POA: Diagnosis not present

## 2015-01-10 DIAGNOSIS — N186 End stage renal disease: Secondary | ICD-10-CM | POA: Diagnosis not present

## 2015-01-10 DIAGNOSIS — D631 Anemia in chronic kidney disease: Secondary | ICD-10-CM | POA: Diagnosis not present

## 2015-01-11 DIAGNOSIS — N186 End stage renal disease: Secondary | ICD-10-CM | POA: Diagnosis not present

## 2015-01-11 DIAGNOSIS — D631 Anemia in chronic kidney disease: Secondary | ICD-10-CM | POA: Diagnosis not present

## 2015-01-11 DIAGNOSIS — Z4932 Encounter for adequacy testing for peritoneal dialysis: Secondary | ICD-10-CM | POA: Diagnosis not present

## 2015-01-12 DIAGNOSIS — Z4932 Encounter for adequacy testing for peritoneal dialysis: Secondary | ICD-10-CM | POA: Diagnosis not present

## 2015-01-12 DIAGNOSIS — D631 Anemia in chronic kidney disease: Secondary | ICD-10-CM | POA: Diagnosis not present

## 2015-01-12 DIAGNOSIS — N186 End stage renal disease: Secondary | ICD-10-CM | POA: Diagnosis not present

## 2015-01-13 DIAGNOSIS — N186 End stage renal disease: Secondary | ICD-10-CM | POA: Diagnosis not present

## 2015-01-13 DIAGNOSIS — Z4932 Encounter for adequacy testing for peritoneal dialysis: Secondary | ICD-10-CM | POA: Diagnosis not present

## 2015-01-13 DIAGNOSIS — D631 Anemia in chronic kidney disease: Secondary | ICD-10-CM | POA: Diagnosis not present

## 2015-01-14 DIAGNOSIS — N186 End stage renal disease: Secondary | ICD-10-CM | POA: Diagnosis not present

## 2015-01-14 DIAGNOSIS — Z4932 Encounter for adequacy testing for peritoneal dialysis: Secondary | ICD-10-CM | POA: Diagnosis not present

## 2015-01-14 DIAGNOSIS — D631 Anemia in chronic kidney disease: Secondary | ICD-10-CM | POA: Diagnosis not present

## 2015-01-15 DIAGNOSIS — Z4932 Encounter for adequacy testing for peritoneal dialysis: Secondary | ICD-10-CM | POA: Diagnosis not present

## 2015-01-15 DIAGNOSIS — N186 End stage renal disease: Secondary | ICD-10-CM | POA: Diagnosis not present

## 2015-01-15 DIAGNOSIS — D631 Anemia in chronic kidney disease: Secondary | ICD-10-CM | POA: Diagnosis not present

## 2015-01-16 DIAGNOSIS — Z4932 Encounter for adequacy testing for peritoneal dialysis: Secondary | ICD-10-CM | POA: Diagnosis not present

## 2015-01-16 DIAGNOSIS — N186 End stage renal disease: Secondary | ICD-10-CM | POA: Diagnosis not present

## 2015-01-16 DIAGNOSIS — D631 Anemia in chronic kidney disease: Secondary | ICD-10-CM | POA: Diagnosis not present

## 2015-01-17 DIAGNOSIS — D631 Anemia in chronic kidney disease: Secondary | ICD-10-CM | POA: Diagnosis not present

## 2015-01-17 DIAGNOSIS — N186 End stage renal disease: Secondary | ICD-10-CM | POA: Diagnosis not present

## 2015-01-17 DIAGNOSIS — Z4932 Encounter for adequacy testing for peritoneal dialysis: Secondary | ICD-10-CM | POA: Diagnosis not present

## 2015-01-18 DIAGNOSIS — N186 End stage renal disease: Secondary | ICD-10-CM | POA: Diagnosis not present

## 2015-01-18 DIAGNOSIS — Z4932 Encounter for adequacy testing for peritoneal dialysis: Secondary | ICD-10-CM | POA: Diagnosis not present

## 2015-01-18 DIAGNOSIS — D631 Anemia in chronic kidney disease: Secondary | ICD-10-CM | POA: Diagnosis not present

## 2015-01-19 DIAGNOSIS — N186 End stage renal disease: Secondary | ICD-10-CM | POA: Diagnosis not present

## 2015-01-19 DIAGNOSIS — Z4932 Encounter for adequacy testing for peritoneal dialysis: Secondary | ICD-10-CM | POA: Diagnosis not present

## 2015-01-19 DIAGNOSIS — D631 Anemia in chronic kidney disease: Secondary | ICD-10-CM | POA: Diagnosis not present

## 2015-01-20 DIAGNOSIS — D631 Anemia in chronic kidney disease: Secondary | ICD-10-CM | POA: Diagnosis not present

## 2015-01-20 DIAGNOSIS — Z4932 Encounter for adequacy testing for peritoneal dialysis: Secondary | ICD-10-CM | POA: Diagnosis not present

## 2015-01-20 DIAGNOSIS — N186 End stage renal disease: Secondary | ICD-10-CM | POA: Diagnosis not present

## 2015-01-21 DIAGNOSIS — N186 End stage renal disease: Secondary | ICD-10-CM | POA: Diagnosis not present

## 2015-01-21 DIAGNOSIS — D631 Anemia in chronic kidney disease: Secondary | ICD-10-CM | POA: Diagnosis not present

## 2015-01-21 DIAGNOSIS — Z4932 Encounter for adequacy testing for peritoneal dialysis: Secondary | ICD-10-CM | POA: Diagnosis not present

## 2015-01-22 DIAGNOSIS — D631 Anemia in chronic kidney disease: Secondary | ICD-10-CM | POA: Diagnosis not present

## 2015-01-22 DIAGNOSIS — N186 End stage renal disease: Secondary | ICD-10-CM | POA: Diagnosis not present

## 2015-01-22 DIAGNOSIS — Z4932 Encounter for adequacy testing for peritoneal dialysis: Secondary | ICD-10-CM | POA: Diagnosis not present

## 2015-01-23 DIAGNOSIS — Z4932 Encounter for adequacy testing for peritoneal dialysis: Secondary | ICD-10-CM | POA: Diagnosis not present

## 2015-01-23 DIAGNOSIS — N186 End stage renal disease: Secondary | ICD-10-CM | POA: Diagnosis not present

## 2015-01-23 DIAGNOSIS — D631 Anemia in chronic kidney disease: Secondary | ICD-10-CM | POA: Diagnosis not present

## 2015-01-24 DIAGNOSIS — N186 End stage renal disease: Secondary | ICD-10-CM | POA: Diagnosis not present

## 2015-01-24 DIAGNOSIS — D631 Anemia in chronic kidney disease: Secondary | ICD-10-CM | POA: Diagnosis not present

## 2015-01-24 DIAGNOSIS — Z4932 Encounter for adequacy testing for peritoneal dialysis: Secondary | ICD-10-CM | POA: Diagnosis not present

## 2015-01-25 DIAGNOSIS — Z992 Dependence on renal dialysis: Secondary | ICD-10-CM | POA: Diagnosis not present

## 2015-01-25 DIAGNOSIS — N186 End stage renal disease: Secondary | ICD-10-CM | POA: Diagnosis not present

## 2015-01-25 DIAGNOSIS — D631 Anemia in chronic kidney disease: Secondary | ICD-10-CM | POA: Diagnosis not present

## 2015-01-25 DIAGNOSIS — Z4932 Encounter for adequacy testing for peritoneal dialysis: Secondary | ICD-10-CM | POA: Diagnosis not present

## 2015-01-26 DIAGNOSIS — D509 Iron deficiency anemia, unspecified: Secondary | ICD-10-CM | POA: Diagnosis not present

## 2015-01-26 DIAGNOSIS — D631 Anemia in chronic kidney disease: Secondary | ICD-10-CM | POA: Diagnosis not present

## 2015-01-26 DIAGNOSIS — Z4932 Encounter for adequacy testing for peritoneal dialysis: Secondary | ICD-10-CM | POA: Diagnosis not present

## 2015-01-26 DIAGNOSIS — N186 End stage renal disease: Secondary | ICD-10-CM | POA: Diagnosis not present

## 2015-01-26 DIAGNOSIS — E440 Moderate protein-calorie malnutrition: Secondary | ICD-10-CM | POA: Diagnosis not present

## 2015-01-26 DIAGNOSIS — Z5189 Encounter for other specified aftercare: Secondary | ICD-10-CM | POA: Diagnosis not present

## 2015-01-26 DIAGNOSIS — Z79899 Other long term (current) drug therapy: Secondary | ICD-10-CM | POA: Diagnosis not present

## 2015-01-26 DIAGNOSIS — K769 Liver disease, unspecified: Secondary | ICD-10-CM | POA: Diagnosis not present

## 2015-01-27 DIAGNOSIS — D509 Iron deficiency anemia, unspecified: Secondary | ICD-10-CM | POA: Diagnosis not present

## 2015-01-27 DIAGNOSIS — D631 Anemia in chronic kidney disease: Secondary | ICD-10-CM | POA: Diagnosis not present

## 2015-01-27 DIAGNOSIS — Z4932 Encounter for adequacy testing for peritoneal dialysis: Secondary | ICD-10-CM | POA: Diagnosis not present

## 2015-01-27 DIAGNOSIS — N186 End stage renal disease: Secondary | ICD-10-CM | POA: Diagnosis not present

## 2015-01-27 DIAGNOSIS — E440 Moderate protein-calorie malnutrition: Secondary | ICD-10-CM | POA: Diagnosis not present

## 2015-01-27 DIAGNOSIS — K769 Liver disease, unspecified: Secondary | ICD-10-CM | POA: Diagnosis not present

## 2015-01-28 DIAGNOSIS — D509 Iron deficiency anemia, unspecified: Secondary | ICD-10-CM | POA: Diagnosis not present

## 2015-01-28 DIAGNOSIS — Z4932 Encounter for adequacy testing for peritoneal dialysis: Secondary | ICD-10-CM | POA: Diagnosis not present

## 2015-01-28 DIAGNOSIS — D631 Anemia in chronic kidney disease: Secondary | ICD-10-CM | POA: Diagnosis not present

## 2015-01-28 DIAGNOSIS — N186 End stage renal disease: Secondary | ICD-10-CM | POA: Diagnosis not present

## 2015-01-28 DIAGNOSIS — E440 Moderate protein-calorie malnutrition: Secondary | ICD-10-CM | POA: Diagnosis not present

## 2015-01-28 DIAGNOSIS — K769 Liver disease, unspecified: Secondary | ICD-10-CM | POA: Diagnosis not present

## 2015-01-29 DIAGNOSIS — E440 Moderate protein-calorie malnutrition: Secondary | ICD-10-CM | POA: Diagnosis not present

## 2015-01-29 DIAGNOSIS — N186 End stage renal disease: Secondary | ICD-10-CM | POA: Diagnosis not present

## 2015-01-29 DIAGNOSIS — Z4932 Encounter for adequacy testing for peritoneal dialysis: Secondary | ICD-10-CM | POA: Diagnosis not present

## 2015-01-29 DIAGNOSIS — R8299 Other abnormal findings in urine: Secondary | ICD-10-CM | POA: Diagnosis not present

## 2015-01-29 DIAGNOSIS — D509 Iron deficiency anemia, unspecified: Secondary | ICD-10-CM | POA: Diagnosis not present

## 2015-01-29 DIAGNOSIS — D631 Anemia in chronic kidney disease: Secondary | ICD-10-CM | POA: Diagnosis not present

## 2015-01-29 DIAGNOSIS — K769 Liver disease, unspecified: Secondary | ICD-10-CM | POA: Diagnosis not present

## 2015-01-30 DIAGNOSIS — Z4932 Encounter for adequacy testing for peritoneal dialysis: Secondary | ICD-10-CM | POA: Diagnosis not present

## 2015-01-30 DIAGNOSIS — D509 Iron deficiency anemia, unspecified: Secondary | ICD-10-CM | POA: Diagnosis not present

## 2015-01-30 DIAGNOSIS — D631 Anemia in chronic kidney disease: Secondary | ICD-10-CM | POA: Diagnosis not present

## 2015-01-30 DIAGNOSIS — N186 End stage renal disease: Secondary | ICD-10-CM | POA: Diagnosis not present

## 2015-01-30 DIAGNOSIS — K769 Liver disease, unspecified: Secondary | ICD-10-CM | POA: Diagnosis not present

## 2015-01-30 DIAGNOSIS — E440 Moderate protein-calorie malnutrition: Secondary | ICD-10-CM | POA: Diagnosis not present

## 2015-01-31 DIAGNOSIS — D509 Iron deficiency anemia, unspecified: Secondary | ICD-10-CM | POA: Diagnosis not present

## 2015-01-31 DIAGNOSIS — K769 Liver disease, unspecified: Secondary | ICD-10-CM | POA: Diagnosis not present

## 2015-01-31 DIAGNOSIS — Z4932 Encounter for adequacy testing for peritoneal dialysis: Secondary | ICD-10-CM | POA: Diagnosis not present

## 2015-01-31 DIAGNOSIS — E440 Moderate protein-calorie malnutrition: Secondary | ICD-10-CM | POA: Diagnosis not present

## 2015-01-31 DIAGNOSIS — N186 End stage renal disease: Secondary | ICD-10-CM | POA: Diagnosis not present

## 2015-01-31 DIAGNOSIS — D631 Anemia in chronic kidney disease: Secondary | ICD-10-CM | POA: Diagnosis not present

## 2015-02-01 DIAGNOSIS — K769 Liver disease, unspecified: Secondary | ICD-10-CM | POA: Diagnosis not present

## 2015-02-01 DIAGNOSIS — Z4932 Encounter for adequacy testing for peritoneal dialysis: Secondary | ICD-10-CM | POA: Diagnosis not present

## 2015-02-01 DIAGNOSIS — D631 Anemia in chronic kidney disease: Secondary | ICD-10-CM | POA: Diagnosis not present

## 2015-02-01 DIAGNOSIS — N186 End stage renal disease: Secondary | ICD-10-CM | POA: Diagnosis not present

## 2015-02-01 DIAGNOSIS — E440 Moderate protein-calorie malnutrition: Secondary | ICD-10-CM | POA: Diagnosis not present

## 2015-02-01 DIAGNOSIS — D509 Iron deficiency anemia, unspecified: Secondary | ICD-10-CM | POA: Diagnosis not present

## 2015-02-02 DIAGNOSIS — D509 Iron deficiency anemia, unspecified: Secondary | ICD-10-CM | POA: Diagnosis not present

## 2015-02-02 DIAGNOSIS — D631 Anemia in chronic kidney disease: Secondary | ICD-10-CM | POA: Diagnosis not present

## 2015-02-02 DIAGNOSIS — N186 End stage renal disease: Secondary | ICD-10-CM | POA: Diagnosis not present

## 2015-02-02 DIAGNOSIS — E440 Moderate protein-calorie malnutrition: Secondary | ICD-10-CM | POA: Diagnosis not present

## 2015-02-02 DIAGNOSIS — K769 Liver disease, unspecified: Secondary | ICD-10-CM | POA: Diagnosis not present

## 2015-02-02 DIAGNOSIS — Z4932 Encounter for adequacy testing for peritoneal dialysis: Secondary | ICD-10-CM | POA: Diagnosis not present

## 2015-02-03 DIAGNOSIS — D631 Anemia in chronic kidney disease: Secondary | ICD-10-CM | POA: Diagnosis not present

## 2015-02-03 DIAGNOSIS — N186 End stage renal disease: Secondary | ICD-10-CM | POA: Diagnosis not present

## 2015-02-03 DIAGNOSIS — D509 Iron deficiency anemia, unspecified: Secondary | ICD-10-CM | POA: Diagnosis not present

## 2015-02-03 DIAGNOSIS — Z4932 Encounter for adequacy testing for peritoneal dialysis: Secondary | ICD-10-CM | POA: Diagnosis not present

## 2015-02-03 DIAGNOSIS — E440 Moderate protein-calorie malnutrition: Secondary | ICD-10-CM | POA: Diagnosis not present

## 2015-02-03 DIAGNOSIS — K769 Liver disease, unspecified: Secondary | ICD-10-CM | POA: Diagnosis not present

## 2015-02-04 DIAGNOSIS — E440 Moderate protein-calorie malnutrition: Secondary | ICD-10-CM | POA: Diagnosis not present

## 2015-02-04 DIAGNOSIS — D509 Iron deficiency anemia, unspecified: Secondary | ICD-10-CM | POA: Diagnosis not present

## 2015-02-04 DIAGNOSIS — Z4932 Encounter for adequacy testing for peritoneal dialysis: Secondary | ICD-10-CM | POA: Diagnosis not present

## 2015-02-04 DIAGNOSIS — D631 Anemia in chronic kidney disease: Secondary | ICD-10-CM | POA: Diagnosis not present

## 2015-02-04 DIAGNOSIS — N186 End stage renal disease: Secondary | ICD-10-CM | POA: Diagnosis not present

## 2015-02-04 DIAGNOSIS — K769 Liver disease, unspecified: Secondary | ICD-10-CM | POA: Diagnosis not present

## 2015-02-05 DIAGNOSIS — D631 Anemia in chronic kidney disease: Secondary | ICD-10-CM | POA: Diagnosis not present

## 2015-02-05 DIAGNOSIS — D509 Iron deficiency anemia, unspecified: Secondary | ICD-10-CM | POA: Diagnosis not present

## 2015-02-05 DIAGNOSIS — K769 Liver disease, unspecified: Secondary | ICD-10-CM | POA: Diagnosis not present

## 2015-02-05 DIAGNOSIS — Z4932 Encounter for adequacy testing for peritoneal dialysis: Secondary | ICD-10-CM | POA: Diagnosis not present

## 2015-02-05 DIAGNOSIS — N186 End stage renal disease: Secondary | ICD-10-CM | POA: Diagnosis not present

## 2015-02-05 DIAGNOSIS — E440 Moderate protein-calorie malnutrition: Secondary | ICD-10-CM | POA: Diagnosis not present

## 2015-02-06 DIAGNOSIS — D631 Anemia in chronic kidney disease: Secondary | ICD-10-CM | POA: Diagnosis not present

## 2015-02-06 DIAGNOSIS — Z4932 Encounter for adequacy testing for peritoneal dialysis: Secondary | ICD-10-CM | POA: Diagnosis not present

## 2015-02-06 DIAGNOSIS — D509 Iron deficiency anemia, unspecified: Secondary | ICD-10-CM | POA: Diagnosis not present

## 2015-02-06 DIAGNOSIS — E440 Moderate protein-calorie malnutrition: Secondary | ICD-10-CM | POA: Diagnosis not present

## 2015-02-06 DIAGNOSIS — N186 End stage renal disease: Secondary | ICD-10-CM | POA: Diagnosis not present

## 2015-02-06 DIAGNOSIS — K769 Liver disease, unspecified: Secondary | ICD-10-CM | POA: Diagnosis not present

## 2015-02-07 DIAGNOSIS — E440 Moderate protein-calorie malnutrition: Secondary | ICD-10-CM | POA: Diagnosis not present

## 2015-02-07 DIAGNOSIS — D631 Anemia in chronic kidney disease: Secondary | ICD-10-CM | POA: Diagnosis not present

## 2015-02-07 DIAGNOSIS — N186 End stage renal disease: Secondary | ICD-10-CM | POA: Diagnosis not present

## 2015-02-07 DIAGNOSIS — D509 Iron deficiency anemia, unspecified: Secondary | ICD-10-CM | POA: Diagnosis not present

## 2015-02-07 DIAGNOSIS — Z4932 Encounter for adequacy testing for peritoneal dialysis: Secondary | ICD-10-CM | POA: Diagnosis not present

## 2015-02-07 DIAGNOSIS — K769 Liver disease, unspecified: Secondary | ICD-10-CM | POA: Diagnosis not present

## 2015-02-08 DIAGNOSIS — D631 Anemia in chronic kidney disease: Secondary | ICD-10-CM | POA: Diagnosis not present

## 2015-02-08 DIAGNOSIS — N186 End stage renal disease: Secondary | ICD-10-CM | POA: Diagnosis not present

## 2015-02-08 DIAGNOSIS — D509 Iron deficiency anemia, unspecified: Secondary | ICD-10-CM | POA: Diagnosis not present

## 2015-02-08 DIAGNOSIS — E440 Moderate protein-calorie malnutrition: Secondary | ICD-10-CM | POA: Diagnosis not present

## 2015-02-08 DIAGNOSIS — K769 Liver disease, unspecified: Secondary | ICD-10-CM | POA: Diagnosis not present

## 2015-02-08 DIAGNOSIS — Z4932 Encounter for adequacy testing for peritoneal dialysis: Secondary | ICD-10-CM | POA: Diagnosis not present

## 2015-02-09 DIAGNOSIS — D509 Iron deficiency anemia, unspecified: Secondary | ICD-10-CM | POA: Diagnosis not present

## 2015-02-09 DIAGNOSIS — N186 End stage renal disease: Secondary | ICD-10-CM | POA: Diagnosis not present

## 2015-02-09 DIAGNOSIS — D631 Anemia in chronic kidney disease: Secondary | ICD-10-CM | POA: Diagnosis not present

## 2015-02-09 DIAGNOSIS — Z4932 Encounter for adequacy testing for peritoneal dialysis: Secondary | ICD-10-CM | POA: Diagnosis not present

## 2015-02-09 DIAGNOSIS — E440 Moderate protein-calorie malnutrition: Secondary | ICD-10-CM | POA: Diagnosis not present

## 2015-02-09 DIAGNOSIS — K769 Liver disease, unspecified: Secondary | ICD-10-CM | POA: Diagnosis not present

## 2015-02-10 DIAGNOSIS — E440 Moderate protein-calorie malnutrition: Secondary | ICD-10-CM | POA: Diagnosis not present

## 2015-02-10 DIAGNOSIS — D509 Iron deficiency anemia, unspecified: Secondary | ICD-10-CM | POA: Diagnosis not present

## 2015-02-10 DIAGNOSIS — K769 Liver disease, unspecified: Secondary | ICD-10-CM | POA: Diagnosis not present

## 2015-02-10 DIAGNOSIS — D631 Anemia in chronic kidney disease: Secondary | ICD-10-CM | POA: Diagnosis not present

## 2015-02-10 DIAGNOSIS — N186 End stage renal disease: Secondary | ICD-10-CM | POA: Diagnosis not present

## 2015-02-10 DIAGNOSIS — Z4932 Encounter for adequacy testing for peritoneal dialysis: Secondary | ICD-10-CM | POA: Diagnosis not present

## 2015-02-11 DIAGNOSIS — N186 End stage renal disease: Secondary | ICD-10-CM | POA: Diagnosis not present

## 2015-02-11 DIAGNOSIS — K769 Liver disease, unspecified: Secondary | ICD-10-CM | POA: Diagnosis not present

## 2015-02-11 DIAGNOSIS — Z4932 Encounter for adequacy testing for peritoneal dialysis: Secondary | ICD-10-CM | POA: Diagnosis not present

## 2015-02-11 DIAGNOSIS — D631 Anemia in chronic kidney disease: Secondary | ICD-10-CM | POA: Diagnosis not present

## 2015-02-11 DIAGNOSIS — E440 Moderate protein-calorie malnutrition: Secondary | ICD-10-CM | POA: Diagnosis not present

## 2015-02-11 DIAGNOSIS — D509 Iron deficiency anemia, unspecified: Secondary | ICD-10-CM | POA: Diagnosis not present

## 2015-02-12 DIAGNOSIS — D631 Anemia in chronic kidney disease: Secondary | ICD-10-CM | POA: Diagnosis not present

## 2015-02-12 DIAGNOSIS — K769 Liver disease, unspecified: Secondary | ICD-10-CM | POA: Diagnosis not present

## 2015-02-12 DIAGNOSIS — D509 Iron deficiency anemia, unspecified: Secondary | ICD-10-CM | POA: Diagnosis not present

## 2015-02-12 DIAGNOSIS — N186 End stage renal disease: Secondary | ICD-10-CM | POA: Diagnosis not present

## 2015-02-12 DIAGNOSIS — E440 Moderate protein-calorie malnutrition: Secondary | ICD-10-CM | POA: Diagnosis not present

## 2015-02-12 DIAGNOSIS — Z4932 Encounter for adequacy testing for peritoneal dialysis: Secondary | ICD-10-CM | POA: Diagnosis not present

## 2015-02-13 DIAGNOSIS — E440 Moderate protein-calorie malnutrition: Secondary | ICD-10-CM | POA: Diagnosis not present

## 2015-02-13 DIAGNOSIS — D509 Iron deficiency anemia, unspecified: Secondary | ICD-10-CM | POA: Diagnosis not present

## 2015-02-13 DIAGNOSIS — D631 Anemia in chronic kidney disease: Secondary | ICD-10-CM | POA: Diagnosis not present

## 2015-02-13 DIAGNOSIS — K769 Liver disease, unspecified: Secondary | ICD-10-CM | POA: Diagnosis not present

## 2015-02-13 DIAGNOSIS — N186 End stage renal disease: Secondary | ICD-10-CM | POA: Diagnosis not present

## 2015-02-13 DIAGNOSIS — Z4932 Encounter for adequacy testing for peritoneal dialysis: Secondary | ICD-10-CM | POA: Diagnosis not present

## 2015-02-14 DIAGNOSIS — N186 End stage renal disease: Secondary | ICD-10-CM | POA: Diagnosis not present

## 2015-02-14 DIAGNOSIS — E440 Moderate protein-calorie malnutrition: Secondary | ICD-10-CM | POA: Diagnosis not present

## 2015-02-14 DIAGNOSIS — D509 Iron deficiency anemia, unspecified: Secondary | ICD-10-CM | POA: Diagnosis not present

## 2015-02-14 DIAGNOSIS — K769 Liver disease, unspecified: Secondary | ICD-10-CM | POA: Diagnosis not present

## 2015-02-14 DIAGNOSIS — Z4932 Encounter for adequacy testing for peritoneal dialysis: Secondary | ICD-10-CM | POA: Diagnosis not present

## 2015-02-14 DIAGNOSIS — D631 Anemia in chronic kidney disease: Secondary | ICD-10-CM | POA: Diagnosis not present

## 2015-02-15 DIAGNOSIS — D509 Iron deficiency anemia, unspecified: Secondary | ICD-10-CM | POA: Diagnosis not present

## 2015-02-15 DIAGNOSIS — K769 Liver disease, unspecified: Secondary | ICD-10-CM | POA: Diagnosis not present

## 2015-02-15 DIAGNOSIS — D631 Anemia in chronic kidney disease: Secondary | ICD-10-CM | POA: Diagnosis not present

## 2015-02-15 DIAGNOSIS — E440 Moderate protein-calorie malnutrition: Secondary | ICD-10-CM | POA: Diagnosis not present

## 2015-02-15 DIAGNOSIS — Z4932 Encounter for adequacy testing for peritoneal dialysis: Secondary | ICD-10-CM | POA: Diagnosis not present

## 2015-02-15 DIAGNOSIS — N186 End stage renal disease: Secondary | ICD-10-CM | POA: Diagnosis not present

## 2015-02-16 DIAGNOSIS — N186 End stage renal disease: Secondary | ICD-10-CM | POA: Diagnosis not present

## 2015-02-16 DIAGNOSIS — K769 Liver disease, unspecified: Secondary | ICD-10-CM | POA: Diagnosis not present

## 2015-02-16 DIAGNOSIS — Z4932 Encounter for adequacy testing for peritoneal dialysis: Secondary | ICD-10-CM | POA: Diagnosis not present

## 2015-02-16 DIAGNOSIS — D631 Anemia in chronic kidney disease: Secondary | ICD-10-CM | POA: Diagnosis not present

## 2015-02-16 DIAGNOSIS — E440 Moderate protein-calorie malnutrition: Secondary | ICD-10-CM | POA: Diagnosis not present

## 2015-02-16 DIAGNOSIS — D509 Iron deficiency anemia, unspecified: Secondary | ICD-10-CM | POA: Diagnosis not present

## 2015-02-17 DIAGNOSIS — D509 Iron deficiency anemia, unspecified: Secondary | ICD-10-CM | POA: Diagnosis not present

## 2015-02-17 DIAGNOSIS — E440 Moderate protein-calorie malnutrition: Secondary | ICD-10-CM | POA: Diagnosis not present

## 2015-02-17 DIAGNOSIS — K769 Liver disease, unspecified: Secondary | ICD-10-CM | POA: Diagnosis not present

## 2015-02-17 DIAGNOSIS — D631 Anemia in chronic kidney disease: Secondary | ICD-10-CM | POA: Diagnosis not present

## 2015-02-17 DIAGNOSIS — Z4932 Encounter for adequacy testing for peritoneal dialysis: Secondary | ICD-10-CM | POA: Diagnosis not present

## 2015-02-17 DIAGNOSIS — N186 End stage renal disease: Secondary | ICD-10-CM | POA: Diagnosis not present

## 2015-02-18 DIAGNOSIS — Z4932 Encounter for adequacy testing for peritoneal dialysis: Secondary | ICD-10-CM | POA: Diagnosis not present

## 2015-02-18 DIAGNOSIS — D509 Iron deficiency anemia, unspecified: Secondary | ICD-10-CM | POA: Diagnosis not present

## 2015-02-18 DIAGNOSIS — N186 End stage renal disease: Secondary | ICD-10-CM | POA: Diagnosis not present

## 2015-02-18 DIAGNOSIS — E440 Moderate protein-calorie malnutrition: Secondary | ICD-10-CM | POA: Diagnosis not present

## 2015-02-18 DIAGNOSIS — D631 Anemia in chronic kidney disease: Secondary | ICD-10-CM | POA: Diagnosis not present

## 2015-02-18 DIAGNOSIS — K769 Liver disease, unspecified: Secondary | ICD-10-CM | POA: Diagnosis not present

## 2015-02-19 DIAGNOSIS — Z4932 Encounter for adequacy testing for peritoneal dialysis: Secondary | ICD-10-CM | POA: Diagnosis not present

## 2015-02-19 DIAGNOSIS — D631 Anemia in chronic kidney disease: Secondary | ICD-10-CM | POA: Diagnosis not present

## 2015-02-19 DIAGNOSIS — D509 Iron deficiency anemia, unspecified: Secondary | ICD-10-CM | POA: Diagnosis not present

## 2015-02-19 DIAGNOSIS — K769 Liver disease, unspecified: Secondary | ICD-10-CM | POA: Diagnosis not present

## 2015-02-19 DIAGNOSIS — E440 Moderate protein-calorie malnutrition: Secondary | ICD-10-CM | POA: Diagnosis not present

## 2015-02-19 DIAGNOSIS — N186 End stage renal disease: Secondary | ICD-10-CM | POA: Diagnosis not present

## 2015-02-20 DIAGNOSIS — Z4932 Encounter for adequacy testing for peritoneal dialysis: Secondary | ICD-10-CM | POA: Diagnosis not present

## 2015-02-20 DIAGNOSIS — E440 Moderate protein-calorie malnutrition: Secondary | ICD-10-CM | POA: Diagnosis not present

## 2015-02-20 DIAGNOSIS — D509 Iron deficiency anemia, unspecified: Secondary | ICD-10-CM | POA: Diagnosis not present

## 2015-02-20 DIAGNOSIS — D631 Anemia in chronic kidney disease: Secondary | ICD-10-CM | POA: Diagnosis not present

## 2015-02-20 DIAGNOSIS — K769 Liver disease, unspecified: Secondary | ICD-10-CM | POA: Diagnosis not present

## 2015-02-20 DIAGNOSIS — N186 End stage renal disease: Secondary | ICD-10-CM | POA: Diagnosis not present

## 2015-02-21 DIAGNOSIS — D631 Anemia in chronic kidney disease: Secondary | ICD-10-CM | POA: Diagnosis not present

## 2015-02-21 DIAGNOSIS — N186 End stage renal disease: Secondary | ICD-10-CM | POA: Diagnosis not present

## 2015-02-21 DIAGNOSIS — E440 Moderate protein-calorie malnutrition: Secondary | ICD-10-CM | POA: Diagnosis not present

## 2015-02-21 DIAGNOSIS — K769 Liver disease, unspecified: Secondary | ICD-10-CM | POA: Diagnosis not present

## 2015-02-21 DIAGNOSIS — D509 Iron deficiency anemia, unspecified: Secondary | ICD-10-CM | POA: Diagnosis not present

## 2015-02-21 DIAGNOSIS — Z4932 Encounter for adequacy testing for peritoneal dialysis: Secondary | ICD-10-CM | POA: Diagnosis not present

## 2015-02-22 DIAGNOSIS — K769 Liver disease, unspecified: Secondary | ICD-10-CM | POA: Diagnosis not present

## 2015-02-22 DIAGNOSIS — Z4932 Encounter for adequacy testing for peritoneal dialysis: Secondary | ICD-10-CM | POA: Diagnosis not present

## 2015-02-22 DIAGNOSIS — D631 Anemia in chronic kidney disease: Secondary | ICD-10-CM | POA: Diagnosis not present

## 2015-02-22 DIAGNOSIS — E440 Moderate protein-calorie malnutrition: Secondary | ICD-10-CM | POA: Diagnosis not present

## 2015-02-22 DIAGNOSIS — D509 Iron deficiency anemia, unspecified: Secondary | ICD-10-CM | POA: Diagnosis not present

## 2015-02-22 DIAGNOSIS — N186 End stage renal disease: Secondary | ICD-10-CM | POA: Diagnosis not present

## 2015-02-23 DIAGNOSIS — N186 End stage renal disease: Secondary | ICD-10-CM | POA: Diagnosis not present

## 2015-02-23 DIAGNOSIS — K769 Liver disease, unspecified: Secondary | ICD-10-CM | POA: Diagnosis not present

## 2015-02-23 DIAGNOSIS — Z4932 Encounter for adequacy testing for peritoneal dialysis: Secondary | ICD-10-CM | POA: Diagnosis not present

## 2015-02-23 DIAGNOSIS — D631 Anemia in chronic kidney disease: Secondary | ICD-10-CM | POA: Diagnosis not present

## 2015-02-23 DIAGNOSIS — D509 Iron deficiency anemia, unspecified: Secondary | ICD-10-CM | POA: Diagnosis not present

## 2015-02-23 DIAGNOSIS — E440 Moderate protein-calorie malnutrition: Secondary | ICD-10-CM | POA: Diagnosis not present

## 2015-02-24 DIAGNOSIS — D509 Iron deficiency anemia, unspecified: Secondary | ICD-10-CM | POA: Diagnosis not present

## 2015-02-24 DIAGNOSIS — K769 Liver disease, unspecified: Secondary | ICD-10-CM | POA: Diagnosis not present

## 2015-02-24 DIAGNOSIS — D631 Anemia in chronic kidney disease: Secondary | ICD-10-CM | POA: Diagnosis not present

## 2015-02-24 DIAGNOSIS — Z4932 Encounter for adequacy testing for peritoneal dialysis: Secondary | ICD-10-CM | POA: Diagnosis not present

## 2015-02-24 DIAGNOSIS — N186 End stage renal disease: Secondary | ICD-10-CM | POA: Diagnosis not present

## 2015-02-24 DIAGNOSIS — E440 Moderate protein-calorie malnutrition: Secondary | ICD-10-CM | POA: Diagnosis not present

## 2015-02-25 DIAGNOSIS — Z4932 Encounter for adequacy testing for peritoneal dialysis: Secondary | ICD-10-CM | POA: Diagnosis not present

## 2015-02-25 DIAGNOSIS — E440 Moderate protein-calorie malnutrition: Secondary | ICD-10-CM | POA: Diagnosis not present

## 2015-02-25 DIAGNOSIS — D631 Anemia in chronic kidney disease: Secondary | ICD-10-CM | POA: Diagnosis not present

## 2015-02-25 DIAGNOSIS — K769 Liver disease, unspecified: Secondary | ICD-10-CM | POA: Diagnosis not present

## 2015-02-25 DIAGNOSIS — D509 Iron deficiency anemia, unspecified: Secondary | ICD-10-CM | POA: Diagnosis not present

## 2015-02-25 DIAGNOSIS — N186 End stage renal disease: Secondary | ICD-10-CM | POA: Diagnosis not present

## 2015-02-25 DIAGNOSIS — Z992 Dependence on renal dialysis: Secondary | ICD-10-CM | POA: Diagnosis not present

## 2015-02-26 DIAGNOSIS — N2589 Other disorders resulting from impaired renal tubular function: Secondary | ICD-10-CM | POA: Diagnosis not present

## 2015-02-26 DIAGNOSIS — D509 Iron deficiency anemia, unspecified: Secondary | ICD-10-CM | POA: Diagnosis not present

## 2015-02-26 DIAGNOSIS — N2581 Secondary hyperparathyroidism of renal origin: Secondary | ICD-10-CM | POA: Diagnosis not present

## 2015-02-26 DIAGNOSIS — Z4932 Encounter for adequacy testing for peritoneal dialysis: Secondary | ICD-10-CM | POA: Diagnosis not present

## 2015-02-26 DIAGNOSIS — N186 End stage renal disease: Secondary | ICD-10-CM | POA: Diagnosis not present

## 2015-02-26 DIAGNOSIS — D631 Anemia in chronic kidney disease: Secondary | ICD-10-CM | POA: Diagnosis not present

## 2015-02-26 DIAGNOSIS — Z23 Encounter for immunization: Secondary | ICD-10-CM | POA: Diagnosis not present

## 2015-02-27 DIAGNOSIS — N2581 Secondary hyperparathyroidism of renal origin: Secondary | ICD-10-CM | POA: Diagnosis not present

## 2015-02-27 DIAGNOSIS — D631 Anemia in chronic kidney disease: Secondary | ICD-10-CM | POA: Diagnosis not present

## 2015-02-27 DIAGNOSIS — D509 Iron deficiency anemia, unspecified: Secondary | ICD-10-CM | POA: Diagnosis not present

## 2015-02-27 DIAGNOSIS — N186 End stage renal disease: Secondary | ICD-10-CM | POA: Diagnosis not present

## 2015-02-27 DIAGNOSIS — N2589 Other disorders resulting from impaired renal tubular function: Secondary | ICD-10-CM | POA: Diagnosis not present

## 2015-02-27 DIAGNOSIS — Z23 Encounter for immunization: Secondary | ICD-10-CM | POA: Diagnosis not present

## 2015-02-28 DIAGNOSIS — N2589 Other disorders resulting from impaired renal tubular function: Secondary | ICD-10-CM | POA: Diagnosis not present

## 2015-02-28 DIAGNOSIS — D631 Anemia in chronic kidney disease: Secondary | ICD-10-CM | POA: Diagnosis not present

## 2015-02-28 DIAGNOSIS — D509 Iron deficiency anemia, unspecified: Secondary | ICD-10-CM | POA: Diagnosis not present

## 2015-02-28 DIAGNOSIS — Z23 Encounter for immunization: Secondary | ICD-10-CM | POA: Diagnosis not present

## 2015-02-28 DIAGNOSIS — N186 End stage renal disease: Secondary | ICD-10-CM | POA: Diagnosis not present

## 2015-02-28 DIAGNOSIS — N2581 Secondary hyperparathyroidism of renal origin: Secondary | ICD-10-CM | POA: Diagnosis not present

## 2015-03-01 DIAGNOSIS — Z23 Encounter for immunization: Secondary | ICD-10-CM | POA: Diagnosis not present

## 2015-03-01 DIAGNOSIS — D631 Anemia in chronic kidney disease: Secondary | ICD-10-CM | POA: Diagnosis not present

## 2015-03-01 DIAGNOSIS — N186 End stage renal disease: Secondary | ICD-10-CM | POA: Diagnosis not present

## 2015-03-01 DIAGNOSIS — N2589 Other disorders resulting from impaired renal tubular function: Secondary | ICD-10-CM | POA: Diagnosis not present

## 2015-03-01 DIAGNOSIS — N2581 Secondary hyperparathyroidism of renal origin: Secondary | ICD-10-CM | POA: Diagnosis not present

## 2015-03-01 DIAGNOSIS — D509 Iron deficiency anemia, unspecified: Secondary | ICD-10-CM | POA: Diagnosis not present

## 2015-03-02 DIAGNOSIS — N186 End stage renal disease: Secondary | ICD-10-CM | POA: Diagnosis not present

## 2015-03-02 DIAGNOSIS — N2581 Secondary hyperparathyroidism of renal origin: Secondary | ICD-10-CM | POA: Diagnosis not present

## 2015-03-02 DIAGNOSIS — D509 Iron deficiency anemia, unspecified: Secondary | ICD-10-CM | POA: Diagnosis not present

## 2015-03-02 DIAGNOSIS — N2589 Other disorders resulting from impaired renal tubular function: Secondary | ICD-10-CM | POA: Diagnosis not present

## 2015-03-02 DIAGNOSIS — D631 Anemia in chronic kidney disease: Secondary | ICD-10-CM | POA: Diagnosis not present

## 2015-03-02 DIAGNOSIS — Z23 Encounter for immunization: Secondary | ICD-10-CM | POA: Diagnosis not present

## 2015-03-03 DIAGNOSIS — N186 End stage renal disease: Secondary | ICD-10-CM | POA: Diagnosis not present

## 2015-03-03 DIAGNOSIS — N2581 Secondary hyperparathyroidism of renal origin: Secondary | ICD-10-CM | POA: Diagnosis not present

## 2015-03-03 DIAGNOSIS — N2589 Other disorders resulting from impaired renal tubular function: Secondary | ICD-10-CM | POA: Diagnosis not present

## 2015-03-03 DIAGNOSIS — D509 Iron deficiency anemia, unspecified: Secondary | ICD-10-CM | POA: Diagnosis not present

## 2015-03-03 DIAGNOSIS — Z23 Encounter for immunization: Secondary | ICD-10-CM | POA: Diagnosis not present

## 2015-03-03 DIAGNOSIS — D631 Anemia in chronic kidney disease: Secondary | ICD-10-CM | POA: Diagnosis not present

## 2015-03-04 DIAGNOSIS — Z23 Encounter for immunization: Secondary | ICD-10-CM | POA: Diagnosis not present

## 2015-03-04 DIAGNOSIS — N2581 Secondary hyperparathyroidism of renal origin: Secondary | ICD-10-CM | POA: Diagnosis not present

## 2015-03-04 DIAGNOSIS — D509 Iron deficiency anemia, unspecified: Secondary | ICD-10-CM | POA: Diagnosis not present

## 2015-03-04 DIAGNOSIS — N2589 Other disorders resulting from impaired renal tubular function: Secondary | ICD-10-CM | POA: Diagnosis not present

## 2015-03-04 DIAGNOSIS — N186 End stage renal disease: Secondary | ICD-10-CM | POA: Diagnosis not present

## 2015-03-04 DIAGNOSIS — D631 Anemia in chronic kidney disease: Secondary | ICD-10-CM | POA: Diagnosis not present

## 2015-03-05 DIAGNOSIS — N2581 Secondary hyperparathyroidism of renal origin: Secondary | ICD-10-CM | POA: Diagnosis not present

## 2015-03-05 DIAGNOSIS — E1129 Type 2 diabetes mellitus with other diabetic kidney complication: Secondary | ICD-10-CM | POA: Diagnosis not present

## 2015-03-05 DIAGNOSIS — N186 End stage renal disease: Secondary | ICD-10-CM | POA: Diagnosis not present

## 2015-03-05 DIAGNOSIS — R8299 Other abnormal findings in urine: Secondary | ICD-10-CM | POA: Diagnosis not present

## 2015-03-05 DIAGNOSIS — D631 Anemia in chronic kidney disease: Secondary | ICD-10-CM | POA: Diagnosis not present

## 2015-03-05 DIAGNOSIS — Z23 Encounter for immunization: Secondary | ICD-10-CM | POA: Diagnosis not present

## 2015-03-05 DIAGNOSIS — D509 Iron deficiency anemia, unspecified: Secondary | ICD-10-CM | POA: Diagnosis not present

## 2015-03-05 DIAGNOSIS — Z4932 Encounter for adequacy testing for peritoneal dialysis: Secondary | ICD-10-CM | POA: Diagnosis not present

## 2015-03-05 DIAGNOSIS — E784 Other hyperlipidemia: Secondary | ICD-10-CM | POA: Diagnosis not present

## 2015-03-05 DIAGNOSIS — N2589 Other disorders resulting from impaired renal tubular function: Secondary | ICD-10-CM | POA: Diagnosis not present

## 2015-03-06 DIAGNOSIS — N186 End stage renal disease: Secondary | ICD-10-CM | POA: Diagnosis not present

## 2015-03-06 DIAGNOSIS — D631 Anemia in chronic kidney disease: Secondary | ICD-10-CM | POA: Diagnosis not present

## 2015-03-06 DIAGNOSIS — N2589 Other disorders resulting from impaired renal tubular function: Secondary | ICD-10-CM | POA: Diagnosis not present

## 2015-03-06 DIAGNOSIS — D509 Iron deficiency anemia, unspecified: Secondary | ICD-10-CM | POA: Diagnosis not present

## 2015-03-06 DIAGNOSIS — N2581 Secondary hyperparathyroidism of renal origin: Secondary | ICD-10-CM | POA: Diagnosis not present

## 2015-03-06 DIAGNOSIS — Z23 Encounter for immunization: Secondary | ICD-10-CM | POA: Diagnosis not present

## 2015-03-07 DIAGNOSIS — N2581 Secondary hyperparathyroidism of renal origin: Secondary | ICD-10-CM | POA: Diagnosis not present

## 2015-03-07 DIAGNOSIS — D631 Anemia in chronic kidney disease: Secondary | ICD-10-CM | POA: Diagnosis not present

## 2015-03-07 DIAGNOSIS — N186 End stage renal disease: Secondary | ICD-10-CM | POA: Diagnosis not present

## 2015-03-07 DIAGNOSIS — D509 Iron deficiency anemia, unspecified: Secondary | ICD-10-CM | POA: Diagnosis not present

## 2015-03-07 DIAGNOSIS — Z23 Encounter for immunization: Secondary | ICD-10-CM | POA: Diagnosis not present

## 2015-03-07 DIAGNOSIS — N2589 Other disorders resulting from impaired renal tubular function: Secondary | ICD-10-CM | POA: Diagnosis not present

## 2015-03-08 DIAGNOSIS — N2581 Secondary hyperparathyroidism of renal origin: Secondary | ICD-10-CM | POA: Diagnosis not present

## 2015-03-08 DIAGNOSIS — D631 Anemia in chronic kidney disease: Secondary | ICD-10-CM | POA: Diagnosis not present

## 2015-03-08 DIAGNOSIS — Z23 Encounter for immunization: Secondary | ICD-10-CM | POA: Diagnosis not present

## 2015-03-08 DIAGNOSIS — N2589 Other disorders resulting from impaired renal tubular function: Secondary | ICD-10-CM | POA: Diagnosis not present

## 2015-03-08 DIAGNOSIS — N186 End stage renal disease: Secondary | ICD-10-CM | POA: Diagnosis not present

## 2015-03-08 DIAGNOSIS — D509 Iron deficiency anemia, unspecified: Secondary | ICD-10-CM | POA: Diagnosis not present

## 2015-03-09 DIAGNOSIS — N186 End stage renal disease: Secondary | ICD-10-CM | POA: Diagnosis not present

## 2015-03-09 DIAGNOSIS — N2589 Other disorders resulting from impaired renal tubular function: Secondary | ICD-10-CM | POA: Diagnosis not present

## 2015-03-09 DIAGNOSIS — N2581 Secondary hyperparathyroidism of renal origin: Secondary | ICD-10-CM | POA: Diagnosis not present

## 2015-03-09 DIAGNOSIS — D509 Iron deficiency anemia, unspecified: Secondary | ICD-10-CM | POA: Diagnosis not present

## 2015-03-09 DIAGNOSIS — D631 Anemia in chronic kidney disease: Secondary | ICD-10-CM | POA: Diagnosis not present

## 2015-03-09 DIAGNOSIS — Z23 Encounter for immunization: Secondary | ICD-10-CM | POA: Diagnosis not present

## 2015-03-10 DIAGNOSIS — N2581 Secondary hyperparathyroidism of renal origin: Secondary | ICD-10-CM | POA: Diagnosis not present

## 2015-03-10 DIAGNOSIS — N2589 Other disorders resulting from impaired renal tubular function: Secondary | ICD-10-CM | POA: Diagnosis not present

## 2015-03-10 DIAGNOSIS — Z23 Encounter for immunization: Secondary | ICD-10-CM | POA: Diagnosis not present

## 2015-03-10 DIAGNOSIS — D631 Anemia in chronic kidney disease: Secondary | ICD-10-CM | POA: Diagnosis not present

## 2015-03-10 DIAGNOSIS — D509 Iron deficiency anemia, unspecified: Secondary | ICD-10-CM | POA: Diagnosis not present

## 2015-03-10 DIAGNOSIS — N186 End stage renal disease: Secondary | ICD-10-CM | POA: Diagnosis not present

## 2015-03-11 DIAGNOSIS — N2581 Secondary hyperparathyroidism of renal origin: Secondary | ICD-10-CM | POA: Diagnosis not present

## 2015-03-11 DIAGNOSIS — Z23 Encounter for immunization: Secondary | ICD-10-CM | POA: Diagnosis not present

## 2015-03-11 DIAGNOSIS — N2589 Other disorders resulting from impaired renal tubular function: Secondary | ICD-10-CM | POA: Diagnosis not present

## 2015-03-11 DIAGNOSIS — N186 End stage renal disease: Secondary | ICD-10-CM | POA: Diagnosis not present

## 2015-03-11 DIAGNOSIS — D631 Anemia in chronic kidney disease: Secondary | ICD-10-CM | POA: Diagnosis not present

## 2015-03-11 DIAGNOSIS — D509 Iron deficiency anemia, unspecified: Secondary | ICD-10-CM | POA: Diagnosis not present

## 2015-03-12 DIAGNOSIS — Z23 Encounter for immunization: Secondary | ICD-10-CM | POA: Diagnosis not present

## 2015-03-12 DIAGNOSIS — N2589 Other disorders resulting from impaired renal tubular function: Secondary | ICD-10-CM | POA: Diagnosis not present

## 2015-03-12 DIAGNOSIS — D509 Iron deficiency anemia, unspecified: Secondary | ICD-10-CM | POA: Diagnosis not present

## 2015-03-12 DIAGNOSIS — N186 End stage renal disease: Secondary | ICD-10-CM | POA: Diagnosis not present

## 2015-03-12 DIAGNOSIS — N2581 Secondary hyperparathyroidism of renal origin: Secondary | ICD-10-CM | POA: Diagnosis not present

## 2015-03-12 DIAGNOSIS — D631 Anemia in chronic kidney disease: Secondary | ICD-10-CM | POA: Diagnosis not present

## 2015-03-13 DIAGNOSIS — D631 Anemia in chronic kidney disease: Secondary | ICD-10-CM | POA: Diagnosis not present

## 2015-03-13 DIAGNOSIS — D509 Iron deficiency anemia, unspecified: Secondary | ICD-10-CM | POA: Diagnosis not present

## 2015-03-13 DIAGNOSIS — Z23 Encounter for immunization: Secondary | ICD-10-CM | POA: Diagnosis not present

## 2015-03-13 DIAGNOSIS — N186 End stage renal disease: Secondary | ICD-10-CM | POA: Diagnosis not present

## 2015-03-13 DIAGNOSIS — N2581 Secondary hyperparathyroidism of renal origin: Secondary | ICD-10-CM | POA: Diagnosis not present

## 2015-03-13 DIAGNOSIS — N2589 Other disorders resulting from impaired renal tubular function: Secondary | ICD-10-CM | POA: Diagnosis not present

## 2015-03-14 DIAGNOSIS — N2589 Other disorders resulting from impaired renal tubular function: Secondary | ICD-10-CM | POA: Diagnosis not present

## 2015-03-14 DIAGNOSIS — D631 Anemia in chronic kidney disease: Secondary | ICD-10-CM | POA: Diagnosis not present

## 2015-03-14 DIAGNOSIS — N186 End stage renal disease: Secondary | ICD-10-CM | POA: Diagnosis not present

## 2015-03-14 DIAGNOSIS — D509 Iron deficiency anemia, unspecified: Secondary | ICD-10-CM | POA: Diagnosis not present

## 2015-03-14 DIAGNOSIS — N2581 Secondary hyperparathyroidism of renal origin: Secondary | ICD-10-CM | POA: Diagnosis not present

## 2015-03-14 DIAGNOSIS — Z23 Encounter for immunization: Secondary | ICD-10-CM | POA: Diagnosis not present

## 2015-03-15 DIAGNOSIS — N2581 Secondary hyperparathyroidism of renal origin: Secondary | ICD-10-CM | POA: Diagnosis not present

## 2015-03-15 DIAGNOSIS — N186 End stage renal disease: Secondary | ICD-10-CM | POA: Diagnosis not present

## 2015-03-15 DIAGNOSIS — D509 Iron deficiency anemia, unspecified: Secondary | ICD-10-CM | POA: Diagnosis not present

## 2015-03-15 DIAGNOSIS — D631 Anemia in chronic kidney disease: Secondary | ICD-10-CM | POA: Diagnosis not present

## 2015-03-15 DIAGNOSIS — Z23 Encounter for immunization: Secondary | ICD-10-CM | POA: Diagnosis not present

## 2015-03-15 DIAGNOSIS — N2589 Other disorders resulting from impaired renal tubular function: Secondary | ICD-10-CM | POA: Diagnosis not present

## 2015-03-16 DIAGNOSIS — D631 Anemia in chronic kidney disease: Secondary | ICD-10-CM | POA: Diagnosis not present

## 2015-03-16 DIAGNOSIS — D509 Iron deficiency anemia, unspecified: Secondary | ICD-10-CM | POA: Diagnosis not present

## 2015-03-16 DIAGNOSIS — N2581 Secondary hyperparathyroidism of renal origin: Secondary | ICD-10-CM | POA: Diagnosis not present

## 2015-03-16 DIAGNOSIS — N2589 Other disorders resulting from impaired renal tubular function: Secondary | ICD-10-CM | POA: Diagnosis not present

## 2015-03-16 DIAGNOSIS — N186 End stage renal disease: Secondary | ICD-10-CM | POA: Diagnosis not present

## 2015-03-16 DIAGNOSIS — Z23 Encounter for immunization: Secondary | ICD-10-CM | POA: Diagnosis not present

## 2015-03-17 DIAGNOSIS — Z23 Encounter for immunization: Secondary | ICD-10-CM | POA: Diagnosis not present

## 2015-03-17 DIAGNOSIS — N2581 Secondary hyperparathyroidism of renal origin: Secondary | ICD-10-CM | POA: Diagnosis not present

## 2015-03-17 DIAGNOSIS — D631 Anemia in chronic kidney disease: Secondary | ICD-10-CM | POA: Diagnosis not present

## 2015-03-17 DIAGNOSIS — D509 Iron deficiency anemia, unspecified: Secondary | ICD-10-CM | POA: Diagnosis not present

## 2015-03-17 DIAGNOSIS — N186 End stage renal disease: Secondary | ICD-10-CM | POA: Diagnosis not present

## 2015-03-17 DIAGNOSIS — N2589 Other disorders resulting from impaired renal tubular function: Secondary | ICD-10-CM | POA: Diagnosis not present

## 2015-03-18 DIAGNOSIS — N2589 Other disorders resulting from impaired renal tubular function: Secondary | ICD-10-CM | POA: Diagnosis not present

## 2015-03-18 DIAGNOSIS — N186 End stage renal disease: Secondary | ICD-10-CM | POA: Diagnosis not present

## 2015-03-18 DIAGNOSIS — D631 Anemia in chronic kidney disease: Secondary | ICD-10-CM | POA: Diagnosis not present

## 2015-03-18 DIAGNOSIS — D509 Iron deficiency anemia, unspecified: Secondary | ICD-10-CM | POA: Diagnosis not present

## 2015-03-18 DIAGNOSIS — N2581 Secondary hyperparathyroidism of renal origin: Secondary | ICD-10-CM | POA: Diagnosis not present

## 2015-03-18 DIAGNOSIS — Z23 Encounter for immunization: Secondary | ICD-10-CM | POA: Diagnosis not present

## 2015-03-19 DIAGNOSIS — Z23 Encounter for immunization: Secondary | ICD-10-CM | POA: Diagnosis not present

## 2015-03-19 DIAGNOSIS — D509 Iron deficiency anemia, unspecified: Secondary | ICD-10-CM | POA: Diagnosis not present

## 2015-03-19 DIAGNOSIS — N186 End stage renal disease: Secondary | ICD-10-CM | POA: Diagnosis not present

## 2015-03-19 DIAGNOSIS — N2581 Secondary hyperparathyroidism of renal origin: Secondary | ICD-10-CM | POA: Diagnosis not present

## 2015-03-19 DIAGNOSIS — N2589 Other disorders resulting from impaired renal tubular function: Secondary | ICD-10-CM | POA: Diagnosis not present

## 2015-03-19 DIAGNOSIS — D631 Anemia in chronic kidney disease: Secondary | ICD-10-CM | POA: Diagnosis not present

## 2015-03-20 DIAGNOSIS — N186 End stage renal disease: Secondary | ICD-10-CM | POA: Diagnosis not present

## 2015-03-20 DIAGNOSIS — Z23 Encounter for immunization: Secondary | ICD-10-CM | POA: Diagnosis not present

## 2015-03-20 DIAGNOSIS — D509 Iron deficiency anemia, unspecified: Secondary | ICD-10-CM | POA: Diagnosis not present

## 2015-03-20 DIAGNOSIS — N2589 Other disorders resulting from impaired renal tubular function: Secondary | ICD-10-CM | POA: Diagnosis not present

## 2015-03-20 DIAGNOSIS — N2581 Secondary hyperparathyroidism of renal origin: Secondary | ICD-10-CM | POA: Diagnosis not present

## 2015-03-20 DIAGNOSIS — D631 Anemia in chronic kidney disease: Secondary | ICD-10-CM | POA: Diagnosis not present

## 2015-03-21 DIAGNOSIS — N2581 Secondary hyperparathyroidism of renal origin: Secondary | ICD-10-CM | POA: Diagnosis not present

## 2015-03-21 DIAGNOSIS — Z23 Encounter for immunization: Secondary | ICD-10-CM | POA: Diagnosis not present

## 2015-03-21 DIAGNOSIS — N186 End stage renal disease: Secondary | ICD-10-CM | POA: Diagnosis not present

## 2015-03-21 DIAGNOSIS — D631 Anemia in chronic kidney disease: Secondary | ICD-10-CM | POA: Diagnosis not present

## 2015-03-21 DIAGNOSIS — D509 Iron deficiency anemia, unspecified: Secondary | ICD-10-CM | POA: Diagnosis not present

## 2015-03-21 DIAGNOSIS — N2589 Other disorders resulting from impaired renal tubular function: Secondary | ICD-10-CM | POA: Diagnosis not present

## 2015-03-22 DIAGNOSIS — Z23 Encounter for immunization: Secondary | ICD-10-CM | POA: Diagnosis not present

## 2015-03-22 DIAGNOSIS — D509 Iron deficiency anemia, unspecified: Secondary | ICD-10-CM | POA: Diagnosis not present

## 2015-03-22 DIAGNOSIS — N186 End stage renal disease: Secondary | ICD-10-CM | POA: Diagnosis not present

## 2015-03-22 DIAGNOSIS — N2581 Secondary hyperparathyroidism of renal origin: Secondary | ICD-10-CM | POA: Diagnosis not present

## 2015-03-22 DIAGNOSIS — D631 Anemia in chronic kidney disease: Secondary | ICD-10-CM | POA: Diagnosis not present

## 2015-03-22 DIAGNOSIS — N2589 Other disorders resulting from impaired renal tubular function: Secondary | ICD-10-CM | POA: Diagnosis not present

## 2015-03-23 DIAGNOSIS — N2589 Other disorders resulting from impaired renal tubular function: Secondary | ICD-10-CM | POA: Diagnosis not present

## 2015-03-23 DIAGNOSIS — Z23 Encounter for immunization: Secondary | ICD-10-CM | POA: Diagnosis not present

## 2015-03-23 DIAGNOSIS — N186 End stage renal disease: Secondary | ICD-10-CM | POA: Diagnosis not present

## 2015-03-23 DIAGNOSIS — D631 Anemia in chronic kidney disease: Secondary | ICD-10-CM | POA: Diagnosis not present

## 2015-03-23 DIAGNOSIS — N2581 Secondary hyperparathyroidism of renal origin: Secondary | ICD-10-CM | POA: Diagnosis not present

## 2015-03-23 DIAGNOSIS — D509 Iron deficiency anemia, unspecified: Secondary | ICD-10-CM | POA: Diagnosis not present

## 2015-03-24 DIAGNOSIS — N2589 Other disorders resulting from impaired renal tubular function: Secondary | ICD-10-CM | POA: Diagnosis not present

## 2015-03-24 DIAGNOSIS — D509 Iron deficiency anemia, unspecified: Secondary | ICD-10-CM | POA: Diagnosis not present

## 2015-03-24 DIAGNOSIS — N186 End stage renal disease: Secondary | ICD-10-CM | POA: Diagnosis not present

## 2015-03-24 DIAGNOSIS — D631 Anemia in chronic kidney disease: Secondary | ICD-10-CM | POA: Diagnosis not present

## 2015-03-24 DIAGNOSIS — N2581 Secondary hyperparathyroidism of renal origin: Secondary | ICD-10-CM | POA: Diagnosis not present

## 2015-03-24 DIAGNOSIS — Z23 Encounter for immunization: Secondary | ICD-10-CM | POA: Diagnosis not present

## 2015-03-25 DIAGNOSIS — D631 Anemia in chronic kidney disease: Secondary | ICD-10-CM | POA: Diagnosis not present

## 2015-03-25 DIAGNOSIS — N186 End stage renal disease: Secondary | ICD-10-CM | POA: Diagnosis not present

## 2015-03-25 DIAGNOSIS — N2581 Secondary hyperparathyroidism of renal origin: Secondary | ICD-10-CM | POA: Diagnosis not present

## 2015-03-25 DIAGNOSIS — D509 Iron deficiency anemia, unspecified: Secondary | ICD-10-CM | POA: Diagnosis not present

## 2015-03-25 DIAGNOSIS — N2589 Other disorders resulting from impaired renal tubular function: Secondary | ICD-10-CM | POA: Diagnosis not present

## 2015-03-25 DIAGNOSIS — Z23 Encounter for immunization: Secondary | ICD-10-CM | POA: Diagnosis not present

## 2015-03-26 DIAGNOSIS — Z23 Encounter for immunization: Secondary | ICD-10-CM | POA: Diagnosis not present

## 2015-03-26 DIAGNOSIS — N2581 Secondary hyperparathyroidism of renal origin: Secondary | ICD-10-CM | POA: Diagnosis not present

## 2015-03-26 DIAGNOSIS — N2589 Other disorders resulting from impaired renal tubular function: Secondary | ICD-10-CM | POA: Diagnosis not present

## 2015-03-26 DIAGNOSIS — D509 Iron deficiency anemia, unspecified: Secondary | ICD-10-CM | POA: Diagnosis not present

## 2015-03-26 DIAGNOSIS — N186 End stage renal disease: Secondary | ICD-10-CM | POA: Diagnosis not present

## 2015-03-26 DIAGNOSIS — D631 Anemia in chronic kidney disease: Secondary | ICD-10-CM | POA: Diagnosis not present

## 2015-03-27 DIAGNOSIS — Z23 Encounter for immunization: Secondary | ICD-10-CM | POA: Diagnosis not present

## 2015-03-27 DIAGNOSIS — D631 Anemia in chronic kidney disease: Secondary | ICD-10-CM | POA: Diagnosis not present

## 2015-03-27 DIAGNOSIS — N2581 Secondary hyperparathyroidism of renal origin: Secondary | ICD-10-CM | POA: Diagnosis not present

## 2015-03-27 DIAGNOSIS — N186 End stage renal disease: Secondary | ICD-10-CM | POA: Diagnosis not present

## 2015-03-27 DIAGNOSIS — N2589 Other disorders resulting from impaired renal tubular function: Secondary | ICD-10-CM | POA: Diagnosis not present

## 2015-03-27 DIAGNOSIS — D509 Iron deficiency anemia, unspecified: Secondary | ICD-10-CM | POA: Diagnosis not present

## 2015-03-28 DIAGNOSIS — D631 Anemia in chronic kidney disease: Secondary | ICD-10-CM | POA: Diagnosis not present

## 2015-03-28 DIAGNOSIS — E440 Moderate protein-calorie malnutrition: Secondary | ICD-10-CM | POA: Diagnosis not present

## 2015-03-28 DIAGNOSIS — D509 Iron deficiency anemia, unspecified: Secondary | ICD-10-CM | POA: Diagnosis not present

## 2015-03-28 DIAGNOSIS — Z79899 Other long term (current) drug therapy: Secondary | ICD-10-CM | POA: Diagnosis not present

## 2015-03-28 DIAGNOSIS — E1129 Type 2 diabetes mellitus with other diabetic kidney complication: Secondary | ICD-10-CM | POA: Diagnosis not present

## 2015-03-28 DIAGNOSIS — K769 Liver disease, unspecified: Secondary | ICD-10-CM | POA: Diagnosis not present

## 2015-03-28 DIAGNOSIS — N186 End stage renal disease: Secondary | ICD-10-CM | POA: Diagnosis not present

## 2015-03-28 DIAGNOSIS — Z5189 Encounter for other specified aftercare: Secondary | ICD-10-CM | POA: Diagnosis not present

## 2015-03-28 DIAGNOSIS — N2581 Secondary hyperparathyroidism of renal origin: Secondary | ICD-10-CM | POA: Diagnosis not present

## 2015-03-29 DIAGNOSIS — N2581 Secondary hyperparathyroidism of renal origin: Secondary | ICD-10-CM | POA: Diagnosis not present

## 2015-03-29 DIAGNOSIS — K769 Liver disease, unspecified: Secondary | ICD-10-CM | POA: Diagnosis not present

## 2015-03-29 DIAGNOSIS — N186 End stage renal disease: Secondary | ICD-10-CM | POA: Diagnosis not present

## 2015-03-29 DIAGNOSIS — E440 Moderate protein-calorie malnutrition: Secondary | ICD-10-CM | POA: Diagnosis not present

## 2015-03-29 DIAGNOSIS — Z79899 Other long term (current) drug therapy: Secondary | ICD-10-CM | POA: Diagnosis not present

## 2015-03-29 DIAGNOSIS — D509 Iron deficiency anemia, unspecified: Secondary | ICD-10-CM | POA: Diagnosis not present

## 2015-03-30 DIAGNOSIS — N186 End stage renal disease: Secondary | ICD-10-CM | POA: Diagnosis not present

## 2015-03-30 DIAGNOSIS — E440 Moderate protein-calorie malnutrition: Secondary | ICD-10-CM | POA: Diagnosis not present

## 2015-03-30 DIAGNOSIS — D509 Iron deficiency anemia, unspecified: Secondary | ICD-10-CM | POA: Diagnosis not present

## 2015-03-30 DIAGNOSIS — N2581 Secondary hyperparathyroidism of renal origin: Secondary | ICD-10-CM | POA: Diagnosis not present

## 2015-03-30 DIAGNOSIS — K769 Liver disease, unspecified: Secondary | ICD-10-CM | POA: Diagnosis not present

## 2015-03-30 DIAGNOSIS — Z79899 Other long term (current) drug therapy: Secondary | ICD-10-CM | POA: Diagnosis not present

## 2015-03-31 DIAGNOSIS — E440 Moderate protein-calorie malnutrition: Secondary | ICD-10-CM | POA: Diagnosis not present

## 2015-03-31 DIAGNOSIS — N186 End stage renal disease: Secondary | ICD-10-CM | POA: Diagnosis not present

## 2015-03-31 DIAGNOSIS — N2581 Secondary hyperparathyroidism of renal origin: Secondary | ICD-10-CM | POA: Diagnosis not present

## 2015-03-31 DIAGNOSIS — D509 Iron deficiency anemia, unspecified: Secondary | ICD-10-CM | POA: Diagnosis not present

## 2015-03-31 DIAGNOSIS — K769 Liver disease, unspecified: Secondary | ICD-10-CM | POA: Diagnosis not present

## 2015-03-31 DIAGNOSIS — Z79899 Other long term (current) drug therapy: Secondary | ICD-10-CM | POA: Diagnosis not present

## 2015-04-01 DIAGNOSIS — N186 End stage renal disease: Secondary | ICD-10-CM | POA: Diagnosis not present

## 2015-04-01 DIAGNOSIS — D509 Iron deficiency anemia, unspecified: Secondary | ICD-10-CM | POA: Diagnosis not present

## 2015-04-01 DIAGNOSIS — Z79899 Other long term (current) drug therapy: Secondary | ICD-10-CM | POA: Diagnosis not present

## 2015-04-01 DIAGNOSIS — K769 Liver disease, unspecified: Secondary | ICD-10-CM | POA: Diagnosis not present

## 2015-04-01 DIAGNOSIS — E440 Moderate protein-calorie malnutrition: Secondary | ICD-10-CM | POA: Diagnosis not present

## 2015-04-01 DIAGNOSIS — N2581 Secondary hyperparathyroidism of renal origin: Secondary | ICD-10-CM | POA: Diagnosis not present

## 2015-04-02 DIAGNOSIS — N186 End stage renal disease: Secondary | ICD-10-CM | POA: Diagnosis not present

## 2015-04-02 DIAGNOSIS — E440 Moderate protein-calorie malnutrition: Secondary | ICD-10-CM | POA: Diagnosis not present

## 2015-04-02 DIAGNOSIS — N2581 Secondary hyperparathyroidism of renal origin: Secondary | ICD-10-CM | POA: Diagnosis not present

## 2015-04-02 DIAGNOSIS — D509 Iron deficiency anemia, unspecified: Secondary | ICD-10-CM | POA: Diagnosis not present

## 2015-04-02 DIAGNOSIS — K769 Liver disease, unspecified: Secondary | ICD-10-CM | POA: Diagnosis not present

## 2015-04-02 DIAGNOSIS — Z79899 Other long term (current) drug therapy: Secondary | ICD-10-CM | POA: Diagnosis not present

## 2015-04-03 DIAGNOSIS — D509 Iron deficiency anemia, unspecified: Secondary | ICD-10-CM | POA: Diagnosis not present

## 2015-04-03 DIAGNOSIS — N186 End stage renal disease: Secondary | ICD-10-CM | POA: Diagnosis not present

## 2015-04-03 DIAGNOSIS — E440 Moderate protein-calorie malnutrition: Secondary | ICD-10-CM | POA: Diagnosis not present

## 2015-04-03 DIAGNOSIS — N2581 Secondary hyperparathyroidism of renal origin: Secondary | ICD-10-CM | POA: Diagnosis not present

## 2015-04-03 DIAGNOSIS — Z79899 Other long term (current) drug therapy: Secondary | ICD-10-CM | POA: Diagnosis not present

## 2015-04-03 DIAGNOSIS — K769 Liver disease, unspecified: Secondary | ICD-10-CM | POA: Diagnosis not present

## 2015-04-04 DIAGNOSIS — D509 Iron deficiency anemia, unspecified: Secondary | ICD-10-CM | POA: Diagnosis not present

## 2015-04-04 DIAGNOSIS — K769 Liver disease, unspecified: Secondary | ICD-10-CM | POA: Diagnosis not present

## 2015-04-04 DIAGNOSIS — N2581 Secondary hyperparathyroidism of renal origin: Secondary | ICD-10-CM | POA: Diagnosis not present

## 2015-04-04 DIAGNOSIS — Z79899 Other long term (current) drug therapy: Secondary | ICD-10-CM | POA: Diagnosis not present

## 2015-04-04 DIAGNOSIS — N186 End stage renal disease: Secondary | ICD-10-CM | POA: Diagnosis not present

## 2015-04-04 DIAGNOSIS — E440 Moderate protein-calorie malnutrition: Secondary | ICD-10-CM | POA: Diagnosis not present

## 2015-04-05 DIAGNOSIS — N186 End stage renal disease: Secondary | ICD-10-CM | POA: Diagnosis not present

## 2015-04-05 DIAGNOSIS — E440 Moderate protein-calorie malnutrition: Secondary | ICD-10-CM | POA: Diagnosis not present

## 2015-04-05 DIAGNOSIS — K769 Liver disease, unspecified: Secondary | ICD-10-CM | POA: Diagnosis not present

## 2015-04-05 DIAGNOSIS — Z79899 Other long term (current) drug therapy: Secondary | ICD-10-CM | POA: Diagnosis not present

## 2015-04-05 DIAGNOSIS — N2581 Secondary hyperparathyroidism of renal origin: Secondary | ICD-10-CM | POA: Diagnosis not present

## 2015-04-05 DIAGNOSIS — D509 Iron deficiency anemia, unspecified: Secondary | ICD-10-CM | POA: Diagnosis not present

## 2015-04-06 DIAGNOSIS — D509 Iron deficiency anemia, unspecified: Secondary | ICD-10-CM | POA: Diagnosis not present

## 2015-04-06 DIAGNOSIS — N186 End stage renal disease: Secondary | ICD-10-CM | POA: Diagnosis not present

## 2015-04-06 DIAGNOSIS — K769 Liver disease, unspecified: Secondary | ICD-10-CM | POA: Diagnosis not present

## 2015-04-06 DIAGNOSIS — Z79899 Other long term (current) drug therapy: Secondary | ICD-10-CM | POA: Diagnosis not present

## 2015-04-06 DIAGNOSIS — N2581 Secondary hyperparathyroidism of renal origin: Secondary | ICD-10-CM | POA: Diagnosis not present

## 2015-04-06 DIAGNOSIS — E440 Moderate protein-calorie malnutrition: Secondary | ICD-10-CM | POA: Diagnosis not present

## 2015-04-07 DIAGNOSIS — N2581 Secondary hyperparathyroidism of renal origin: Secondary | ICD-10-CM | POA: Diagnosis not present

## 2015-04-07 DIAGNOSIS — N186 End stage renal disease: Secondary | ICD-10-CM | POA: Diagnosis not present

## 2015-04-07 DIAGNOSIS — E440 Moderate protein-calorie malnutrition: Secondary | ICD-10-CM | POA: Diagnosis not present

## 2015-04-07 DIAGNOSIS — K769 Liver disease, unspecified: Secondary | ICD-10-CM | POA: Diagnosis not present

## 2015-04-07 DIAGNOSIS — Z79899 Other long term (current) drug therapy: Secondary | ICD-10-CM | POA: Diagnosis not present

## 2015-04-07 DIAGNOSIS — D509 Iron deficiency anemia, unspecified: Secondary | ICD-10-CM | POA: Diagnosis not present

## 2015-04-08 DIAGNOSIS — D509 Iron deficiency anemia, unspecified: Secondary | ICD-10-CM | POA: Diagnosis not present

## 2015-04-08 DIAGNOSIS — Z79899 Other long term (current) drug therapy: Secondary | ICD-10-CM | POA: Diagnosis not present

## 2015-04-08 DIAGNOSIS — E440 Moderate protein-calorie malnutrition: Secondary | ICD-10-CM | POA: Diagnosis not present

## 2015-04-08 DIAGNOSIS — N2581 Secondary hyperparathyroidism of renal origin: Secondary | ICD-10-CM | POA: Diagnosis not present

## 2015-04-08 DIAGNOSIS — K769 Liver disease, unspecified: Secondary | ICD-10-CM | POA: Diagnosis not present

## 2015-04-08 DIAGNOSIS — N186 End stage renal disease: Secondary | ICD-10-CM | POA: Diagnosis not present

## 2015-04-09 DIAGNOSIS — D509 Iron deficiency anemia, unspecified: Secondary | ICD-10-CM | POA: Diagnosis not present

## 2015-04-09 DIAGNOSIS — Z79899 Other long term (current) drug therapy: Secondary | ICD-10-CM | POA: Diagnosis not present

## 2015-04-09 DIAGNOSIS — N186 End stage renal disease: Secondary | ICD-10-CM | POA: Diagnosis not present

## 2015-04-09 DIAGNOSIS — K769 Liver disease, unspecified: Secondary | ICD-10-CM | POA: Diagnosis not present

## 2015-04-09 DIAGNOSIS — E440 Moderate protein-calorie malnutrition: Secondary | ICD-10-CM | POA: Diagnosis not present

## 2015-04-09 DIAGNOSIS — N2581 Secondary hyperparathyroidism of renal origin: Secondary | ICD-10-CM | POA: Diagnosis not present

## 2015-04-10 DIAGNOSIS — K769 Liver disease, unspecified: Secondary | ICD-10-CM | POA: Diagnosis not present

## 2015-04-10 DIAGNOSIS — N186 End stage renal disease: Secondary | ICD-10-CM | POA: Diagnosis not present

## 2015-04-10 DIAGNOSIS — N2581 Secondary hyperparathyroidism of renal origin: Secondary | ICD-10-CM | POA: Diagnosis not present

## 2015-04-10 DIAGNOSIS — D509 Iron deficiency anemia, unspecified: Secondary | ICD-10-CM | POA: Diagnosis not present

## 2015-04-10 DIAGNOSIS — E440 Moderate protein-calorie malnutrition: Secondary | ICD-10-CM | POA: Diagnosis not present

## 2015-04-10 DIAGNOSIS — Z79899 Other long term (current) drug therapy: Secondary | ICD-10-CM | POA: Diagnosis not present

## 2015-04-11 DIAGNOSIS — Z79899 Other long term (current) drug therapy: Secondary | ICD-10-CM | POA: Diagnosis not present

## 2015-04-11 DIAGNOSIS — N186 End stage renal disease: Secondary | ICD-10-CM | POA: Diagnosis not present

## 2015-04-11 DIAGNOSIS — K769 Liver disease, unspecified: Secondary | ICD-10-CM | POA: Diagnosis not present

## 2015-04-11 DIAGNOSIS — D509 Iron deficiency anemia, unspecified: Secondary | ICD-10-CM | POA: Diagnosis not present

## 2015-04-11 DIAGNOSIS — N2581 Secondary hyperparathyroidism of renal origin: Secondary | ICD-10-CM | POA: Diagnosis not present

## 2015-04-11 DIAGNOSIS — E440 Moderate protein-calorie malnutrition: Secondary | ICD-10-CM | POA: Diagnosis not present

## 2015-04-12 DIAGNOSIS — N186 End stage renal disease: Secondary | ICD-10-CM | POA: Diagnosis not present

## 2015-04-12 DIAGNOSIS — N2581 Secondary hyperparathyroidism of renal origin: Secondary | ICD-10-CM | POA: Diagnosis not present

## 2015-04-12 DIAGNOSIS — K769 Liver disease, unspecified: Secondary | ICD-10-CM | POA: Diagnosis not present

## 2015-04-12 DIAGNOSIS — Z79899 Other long term (current) drug therapy: Secondary | ICD-10-CM | POA: Diagnosis not present

## 2015-04-12 DIAGNOSIS — D509 Iron deficiency anemia, unspecified: Secondary | ICD-10-CM | POA: Diagnosis not present

## 2015-04-12 DIAGNOSIS — E440 Moderate protein-calorie malnutrition: Secondary | ICD-10-CM | POA: Diagnosis not present

## 2015-04-13 DIAGNOSIS — N2581 Secondary hyperparathyroidism of renal origin: Secondary | ICD-10-CM | POA: Diagnosis not present

## 2015-04-13 DIAGNOSIS — N186 End stage renal disease: Secondary | ICD-10-CM | POA: Diagnosis not present

## 2015-04-13 DIAGNOSIS — Z79899 Other long term (current) drug therapy: Secondary | ICD-10-CM | POA: Diagnosis not present

## 2015-04-13 DIAGNOSIS — D509 Iron deficiency anemia, unspecified: Secondary | ICD-10-CM | POA: Diagnosis not present

## 2015-04-13 DIAGNOSIS — E440 Moderate protein-calorie malnutrition: Secondary | ICD-10-CM | POA: Diagnosis not present

## 2015-04-13 DIAGNOSIS — K769 Liver disease, unspecified: Secondary | ICD-10-CM | POA: Diagnosis not present

## 2015-04-14 DIAGNOSIS — E440 Moderate protein-calorie malnutrition: Secondary | ICD-10-CM | POA: Diagnosis not present

## 2015-04-14 DIAGNOSIS — N2581 Secondary hyperparathyroidism of renal origin: Secondary | ICD-10-CM | POA: Diagnosis not present

## 2015-04-14 DIAGNOSIS — K769 Liver disease, unspecified: Secondary | ICD-10-CM | POA: Diagnosis not present

## 2015-04-14 DIAGNOSIS — Z79899 Other long term (current) drug therapy: Secondary | ICD-10-CM | POA: Diagnosis not present

## 2015-04-14 DIAGNOSIS — N186 End stage renal disease: Secondary | ICD-10-CM | POA: Diagnosis not present

## 2015-04-14 DIAGNOSIS — R8299 Other abnormal findings in urine: Secondary | ICD-10-CM | POA: Diagnosis not present

## 2015-04-14 DIAGNOSIS — D509 Iron deficiency anemia, unspecified: Secondary | ICD-10-CM | POA: Diagnosis not present

## 2015-04-15 DIAGNOSIS — E440 Moderate protein-calorie malnutrition: Secondary | ICD-10-CM | POA: Diagnosis not present

## 2015-04-15 DIAGNOSIS — Z79899 Other long term (current) drug therapy: Secondary | ICD-10-CM | POA: Diagnosis not present

## 2015-04-15 DIAGNOSIS — N2581 Secondary hyperparathyroidism of renal origin: Secondary | ICD-10-CM | POA: Diagnosis not present

## 2015-04-15 DIAGNOSIS — N186 End stage renal disease: Secondary | ICD-10-CM | POA: Diagnosis not present

## 2015-04-15 DIAGNOSIS — K769 Liver disease, unspecified: Secondary | ICD-10-CM | POA: Diagnosis not present

## 2015-04-15 DIAGNOSIS — D509 Iron deficiency anemia, unspecified: Secondary | ICD-10-CM | POA: Diagnosis not present

## 2015-04-16 DIAGNOSIS — N2581 Secondary hyperparathyroidism of renal origin: Secondary | ICD-10-CM | POA: Diagnosis not present

## 2015-04-16 DIAGNOSIS — Z79899 Other long term (current) drug therapy: Secondary | ICD-10-CM | POA: Diagnosis not present

## 2015-04-16 DIAGNOSIS — E440 Moderate protein-calorie malnutrition: Secondary | ICD-10-CM | POA: Diagnosis not present

## 2015-04-16 DIAGNOSIS — D509 Iron deficiency anemia, unspecified: Secondary | ICD-10-CM | POA: Diagnosis not present

## 2015-04-16 DIAGNOSIS — K769 Liver disease, unspecified: Secondary | ICD-10-CM | POA: Diagnosis not present

## 2015-04-16 DIAGNOSIS — N186 End stage renal disease: Secondary | ICD-10-CM | POA: Diagnosis not present

## 2015-04-17 DIAGNOSIS — E440 Moderate protein-calorie malnutrition: Secondary | ICD-10-CM | POA: Diagnosis not present

## 2015-04-17 DIAGNOSIS — D509 Iron deficiency anemia, unspecified: Secondary | ICD-10-CM | POA: Diagnosis not present

## 2015-04-17 DIAGNOSIS — N2581 Secondary hyperparathyroidism of renal origin: Secondary | ICD-10-CM | POA: Diagnosis not present

## 2015-04-17 DIAGNOSIS — K769 Liver disease, unspecified: Secondary | ICD-10-CM | POA: Diagnosis not present

## 2015-04-17 DIAGNOSIS — N186 End stage renal disease: Secondary | ICD-10-CM | POA: Diagnosis not present

## 2015-04-17 DIAGNOSIS — Z79899 Other long term (current) drug therapy: Secondary | ICD-10-CM | POA: Diagnosis not present

## 2015-04-18 DIAGNOSIS — N2581 Secondary hyperparathyroidism of renal origin: Secondary | ICD-10-CM | POA: Diagnosis not present

## 2015-04-18 DIAGNOSIS — Z79899 Other long term (current) drug therapy: Secondary | ICD-10-CM | POA: Diagnosis not present

## 2015-04-18 DIAGNOSIS — K769 Liver disease, unspecified: Secondary | ICD-10-CM | POA: Diagnosis not present

## 2015-04-18 DIAGNOSIS — D509 Iron deficiency anemia, unspecified: Secondary | ICD-10-CM | POA: Diagnosis not present

## 2015-04-18 DIAGNOSIS — N186 End stage renal disease: Secondary | ICD-10-CM | POA: Diagnosis not present

## 2015-04-18 DIAGNOSIS — E440 Moderate protein-calorie malnutrition: Secondary | ICD-10-CM | POA: Diagnosis not present

## 2015-04-19 DIAGNOSIS — D509 Iron deficiency anemia, unspecified: Secondary | ICD-10-CM | POA: Diagnosis not present

## 2015-04-19 DIAGNOSIS — N2581 Secondary hyperparathyroidism of renal origin: Secondary | ICD-10-CM | POA: Diagnosis not present

## 2015-04-19 DIAGNOSIS — E440 Moderate protein-calorie malnutrition: Secondary | ICD-10-CM | POA: Diagnosis not present

## 2015-04-19 DIAGNOSIS — K769 Liver disease, unspecified: Secondary | ICD-10-CM | POA: Diagnosis not present

## 2015-04-19 DIAGNOSIS — Z79899 Other long term (current) drug therapy: Secondary | ICD-10-CM | POA: Diagnosis not present

## 2015-04-19 DIAGNOSIS — N186 End stage renal disease: Secondary | ICD-10-CM | POA: Diagnosis not present

## 2015-04-20 DIAGNOSIS — D509 Iron deficiency anemia, unspecified: Secondary | ICD-10-CM | POA: Diagnosis not present

## 2015-04-20 DIAGNOSIS — N186 End stage renal disease: Secondary | ICD-10-CM | POA: Diagnosis not present

## 2015-04-20 DIAGNOSIS — E440 Moderate protein-calorie malnutrition: Secondary | ICD-10-CM | POA: Diagnosis not present

## 2015-04-20 DIAGNOSIS — Z79899 Other long term (current) drug therapy: Secondary | ICD-10-CM | POA: Diagnosis not present

## 2015-04-20 DIAGNOSIS — K769 Liver disease, unspecified: Secondary | ICD-10-CM | POA: Diagnosis not present

## 2015-04-20 DIAGNOSIS — N2581 Secondary hyperparathyroidism of renal origin: Secondary | ICD-10-CM | POA: Diagnosis not present

## 2015-04-21 DIAGNOSIS — Z79899 Other long term (current) drug therapy: Secondary | ICD-10-CM | POA: Diagnosis not present

## 2015-04-21 DIAGNOSIS — D509 Iron deficiency anemia, unspecified: Secondary | ICD-10-CM | POA: Diagnosis not present

## 2015-04-21 DIAGNOSIS — N186 End stage renal disease: Secondary | ICD-10-CM | POA: Diagnosis not present

## 2015-04-21 DIAGNOSIS — K769 Liver disease, unspecified: Secondary | ICD-10-CM | POA: Diagnosis not present

## 2015-04-21 DIAGNOSIS — N2581 Secondary hyperparathyroidism of renal origin: Secondary | ICD-10-CM | POA: Diagnosis not present

## 2015-04-21 DIAGNOSIS — E440 Moderate protein-calorie malnutrition: Secondary | ICD-10-CM | POA: Diagnosis not present

## 2015-04-22 DIAGNOSIS — N186 End stage renal disease: Secondary | ICD-10-CM | POA: Diagnosis not present

## 2015-04-22 DIAGNOSIS — D509 Iron deficiency anemia, unspecified: Secondary | ICD-10-CM | POA: Diagnosis not present

## 2015-04-22 DIAGNOSIS — Z79899 Other long term (current) drug therapy: Secondary | ICD-10-CM | POA: Diagnosis not present

## 2015-04-22 DIAGNOSIS — K769 Liver disease, unspecified: Secondary | ICD-10-CM | POA: Diagnosis not present

## 2015-04-22 DIAGNOSIS — N2581 Secondary hyperparathyroidism of renal origin: Secondary | ICD-10-CM | POA: Diagnosis not present

## 2015-04-22 DIAGNOSIS — E440 Moderate protein-calorie malnutrition: Secondary | ICD-10-CM | POA: Diagnosis not present

## 2015-04-23 DIAGNOSIS — K769 Liver disease, unspecified: Secondary | ICD-10-CM | POA: Diagnosis not present

## 2015-04-23 DIAGNOSIS — N186 End stage renal disease: Secondary | ICD-10-CM | POA: Diagnosis not present

## 2015-04-23 DIAGNOSIS — D509 Iron deficiency anemia, unspecified: Secondary | ICD-10-CM | POA: Diagnosis not present

## 2015-04-23 DIAGNOSIS — E440 Moderate protein-calorie malnutrition: Secondary | ICD-10-CM | POA: Diagnosis not present

## 2015-04-23 DIAGNOSIS — N2581 Secondary hyperparathyroidism of renal origin: Secondary | ICD-10-CM | POA: Diagnosis not present

## 2015-04-23 DIAGNOSIS — Z79899 Other long term (current) drug therapy: Secondary | ICD-10-CM | POA: Diagnosis not present

## 2015-04-24 DIAGNOSIS — N186 End stage renal disease: Secondary | ICD-10-CM | POA: Diagnosis not present

## 2015-04-24 DIAGNOSIS — K769 Liver disease, unspecified: Secondary | ICD-10-CM | POA: Diagnosis not present

## 2015-04-24 DIAGNOSIS — D509 Iron deficiency anemia, unspecified: Secondary | ICD-10-CM | POA: Diagnosis not present

## 2015-04-24 DIAGNOSIS — Z79899 Other long term (current) drug therapy: Secondary | ICD-10-CM | POA: Diagnosis not present

## 2015-04-24 DIAGNOSIS — E440 Moderate protein-calorie malnutrition: Secondary | ICD-10-CM | POA: Diagnosis not present

## 2015-04-24 DIAGNOSIS — N2581 Secondary hyperparathyroidism of renal origin: Secondary | ICD-10-CM | POA: Diagnosis not present

## 2015-04-25 DIAGNOSIS — Z79899 Other long term (current) drug therapy: Secondary | ICD-10-CM | POA: Diagnosis not present

## 2015-04-25 DIAGNOSIS — D509 Iron deficiency anemia, unspecified: Secondary | ICD-10-CM | POA: Diagnosis not present

## 2015-04-25 DIAGNOSIS — K769 Liver disease, unspecified: Secondary | ICD-10-CM | POA: Diagnosis not present

## 2015-04-25 DIAGNOSIS — E440 Moderate protein-calorie malnutrition: Secondary | ICD-10-CM | POA: Diagnosis not present

## 2015-04-25 DIAGNOSIS — N186 End stage renal disease: Secondary | ICD-10-CM | POA: Diagnosis not present

## 2015-04-25 DIAGNOSIS — N2581 Secondary hyperparathyroidism of renal origin: Secondary | ICD-10-CM | POA: Diagnosis not present

## 2015-04-26 DIAGNOSIS — N2581 Secondary hyperparathyroidism of renal origin: Secondary | ICD-10-CM | POA: Diagnosis not present

## 2015-04-26 DIAGNOSIS — N186 End stage renal disease: Secondary | ICD-10-CM | POA: Diagnosis not present

## 2015-04-26 DIAGNOSIS — Z79899 Other long term (current) drug therapy: Secondary | ICD-10-CM | POA: Diagnosis not present

## 2015-04-26 DIAGNOSIS — K769 Liver disease, unspecified: Secondary | ICD-10-CM | POA: Diagnosis not present

## 2015-04-26 DIAGNOSIS — E440 Moderate protein-calorie malnutrition: Secondary | ICD-10-CM | POA: Diagnosis not present

## 2015-04-26 DIAGNOSIS — D509 Iron deficiency anemia, unspecified: Secondary | ICD-10-CM | POA: Diagnosis not present

## 2015-04-27 DIAGNOSIS — E440 Moderate protein-calorie malnutrition: Secondary | ICD-10-CM | POA: Diagnosis not present

## 2015-04-27 DIAGNOSIS — I129 Hypertensive chronic kidney disease with stage 1 through stage 4 chronic kidney disease, or unspecified chronic kidney disease: Secondary | ICD-10-CM | POA: Diagnosis not present

## 2015-04-27 DIAGNOSIS — Z992 Dependence on renal dialysis: Secondary | ICD-10-CM | POA: Diagnosis not present

## 2015-04-27 DIAGNOSIS — Z79899 Other long term (current) drug therapy: Secondary | ICD-10-CM | POA: Diagnosis not present

## 2015-04-27 DIAGNOSIS — N186 End stage renal disease: Secondary | ICD-10-CM | POA: Diagnosis not present

## 2015-04-27 DIAGNOSIS — K769 Liver disease, unspecified: Secondary | ICD-10-CM | POA: Diagnosis not present

## 2015-04-27 DIAGNOSIS — D509 Iron deficiency anemia, unspecified: Secondary | ICD-10-CM | POA: Diagnosis not present

## 2015-04-27 DIAGNOSIS — N2581 Secondary hyperparathyroidism of renal origin: Secondary | ICD-10-CM | POA: Diagnosis not present

## 2015-04-28 DIAGNOSIS — N186 End stage renal disease: Secondary | ICD-10-CM | POA: Diagnosis not present

## 2015-04-28 DIAGNOSIS — N2581 Secondary hyperparathyroidism of renal origin: Secondary | ICD-10-CM | POA: Diagnosis not present

## 2015-04-28 DIAGNOSIS — Z79899 Other long term (current) drug therapy: Secondary | ICD-10-CM | POA: Diagnosis not present

## 2015-04-28 DIAGNOSIS — D631 Anemia in chronic kidney disease: Secondary | ICD-10-CM | POA: Diagnosis not present

## 2015-04-28 DIAGNOSIS — D509 Iron deficiency anemia, unspecified: Secondary | ICD-10-CM | POA: Diagnosis not present

## 2015-04-28 DIAGNOSIS — E440 Moderate protein-calorie malnutrition: Secondary | ICD-10-CM | POA: Diagnosis not present

## 2015-04-28 DIAGNOSIS — E1129 Type 2 diabetes mellitus with other diabetic kidney complication: Secondary | ICD-10-CM | POA: Diagnosis not present

## 2015-04-28 DIAGNOSIS — R8299 Other abnormal findings in urine: Secondary | ICD-10-CM | POA: Diagnosis not present

## 2015-04-28 DIAGNOSIS — K769 Liver disease, unspecified: Secondary | ICD-10-CM | POA: Diagnosis not present

## 2015-04-28 DIAGNOSIS — Z5189 Encounter for other specified aftercare: Secondary | ICD-10-CM | POA: Diagnosis not present

## 2015-04-29 DIAGNOSIS — N2581 Secondary hyperparathyroidism of renal origin: Secondary | ICD-10-CM | POA: Diagnosis not present

## 2015-04-29 DIAGNOSIS — N186 End stage renal disease: Secondary | ICD-10-CM | POA: Diagnosis not present

## 2015-04-29 DIAGNOSIS — D509 Iron deficiency anemia, unspecified: Secondary | ICD-10-CM | POA: Diagnosis not present

## 2015-04-29 DIAGNOSIS — E440 Moderate protein-calorie malnutrition: Secondary | ICD-10-CM | POA: Diagnosis not present

## 2015-04-29 DIAGNOSIS — K769 Liver disease, unspecified: Secondary | ICD-10-CM | POA: Diagnosis not present

## 2015-04-29 DIAGNOSIS — Z79899 Other long term (current) drug therapy: Secondary | ICD-10-CM | POA: Diagnosis not present

## 2015-04-30 DIAGNOSIS — Z79899 Other long term (current) drug therapy: Secondary | ICD-10-CM | POA: Diagnosis not present

## 2015-04-30 DIAGNOSIS — N2581 Secondary hyperparathyroidism of renal origin: Secondary | ICD-10-CM | POA: Diagnosis not present

## 2015-04-30 DIAGNOSIS — D509 Iron deficiency anemia, unspecified: Secondary | ICD-10-CM | POA: Diagnosis not present

## 2015-04-30 DIAGNOSIS — K769 Liver disease, unspecified: Secondary | ICD-10-CM | POA: Diagnosis not present

## 2015-04-30 DIAGNOSIS — E440 Moderate protein-calorie malnutrition: Secondary | ICD-10-CM | POA: Diagnosis not present

## 2015-04-30 DIAGNOSIS — N186 End stage renal disease: Secondary | ICD-10-CM | POA: Diagnosis not present

## 2015-05-01 DIAGNOSIS — N186 End stage renal disease: Secondary | ICD-10-CM | POA: Diagnosis not present

## 2015-05-01 DIAGNOSIS — D509 Iron deficiency anemia, unspecified: Secondary | ICD-10-CM | POA: Diagnosis not present

## 2015-05-01 DIAGNOSIS — K769 Liver disease, unspecified: Secondary | ICD-10-CM | POA: Diagnosis not present

## 2015-05-01 DIAGNOSIS — N2581 Secondary hyperparathyroidism of renal origin: Secondary | ICD-10-CM | POA: Diagnosis not present

## 2015-05-01 DIAGNOSIS — E440 Moderate protein-calorie malnutrition: Secondary | ICD-10-CM | POA: Diagnosis not present

## 2015-05-01 DIAGNOSIS — Z79899 Other long term (current) drug therapy: Secondary | ICD-10-CM | POA: Diagnosis not present

## 2015-05-02 DIAGNOSIS — N186 End stage renal disease: Secondary | ICD-10-CM | POA: Diagnosis not present

## 2015-05-02 DIAGNOSIS — E440 Moderate protein-calorie malnutrition: Secondary | ICD-10-CM | POA: Diagnosis not present

## 2015-05-02 DIAGNOSIS — Z79899 Other long term (current) drug therapy: Secondary | ICD-10-CM | POA: Diagnosis not present

## 2015-05-02 DIAGNOSIS — N2581 Secondary hyperparathyroidism of renal origin: Secondary | ICD-10-CM | POA: Diagnosis not present

## 2015-05-02 DIAGNOSIS — D509 Iron deficiency anemia, unspecified: Secondary | ICD-10-CM | POA: Diagnosis not present

## 2015-05-02 DIAGNOSIS — K769 Liver disease, unspecified: Secondary | ICD-10-CM | POA: Diagnosis not present

## 2015-05-03 DIAGNOSIS — D509 Iron deficiency anemia, unspecified: Secondary | ICD-10-CM | POA: Diagnosis not present

## 2015-05-03 DIAGNOSIS — N2581 Secondary hyperparathyroidism of renal origin: Secondary | ICD-10-CM | POA: Diagnosis not present

## 2015-05-03 DIAGNOSIS — Z79899 Other long term (current) drug therapy: Secondary | ICD-10-CM | POA: Diagnosis not present

## 2015-05-03 DIAGNOSIS — E440 Moderate protein-calorie malnutrition: Secondary | ICD-10-CM | POA: Diagnosis not present

## 2015-05-03 DIAGNOSIS — N186 End stage renal disease: Secondary | ICD-10-CM | POA: Diagnosis not present

## 2015-05-03 DIAGNOSIS — K769 Liver disease, unspecified: Secondary | ICD-10-CM | POA: Diagnosis not present

## 2015-05-04 DIAGNOSIS — D509 Iron deficiency anemia, unspecified: Secondary | ICD-10-CM | POA: Diagnosis not present

## 2015-05-04 DIAGNOSIS — N186 End stage renal disease: Secondary | ICD-10-CM | POA: Diagnosis not present

## 2015-05-04 DIAGNOSIS — K769 Liver disease, unspecified: Secondary | ICD-10-CM | POA: Diagnosis not present

## 2015-05-04 DIAGNOSIS — E440 Moderate protein-calorie malnutrition: Secondary | ICD-10-CM | POA: Diagnosis not present

## 2015-05-04 DIAGNOSIS — N2581 Secondary hyperparathyroidism of renal origin: Secondary | ICD-10-CM | POA: Diagnosis not present

## 2015-05-04 DIAGNOSIS — Z79899 Other long term (current) drug therapy: Secondary | ICD-10-CM | POA: Diagnosis not present

## 2015-05-05 DIAGNOSIS — D509 Iron deficiency anemia, unspecified: Secondary | ICD-10-CM | POA: Diagnosis not present

## 2015-05-05 DIAGNOSIS — Z79899 Other long term (current) drug therapy: Secondary | ICD-10-CM | POA: Diagnosis not present

## 2015-05-05 DIAGNOSIS — K769 Liver disease, unspecified: Secondary | ICD-10-CM | POA: Diagnosis not present

## 2015-05-05 DIAGNOSIS — E440 Moderate protein-calorie malnutrition: Secondary | ICD-10-CM | POA: Diagnosis not present

## 2015-05-05 DIAGNOSIS — N186 End stage renal disease: Secondary | ICD-10-CM | POA: Diagnosis not present

## 2015-05-05 DIAGNOSIS — N2581 Secondary hyperparathyroidism of renal origin: Secondary | ICD-10-CM | POA: Diagnosis not present

## 2015-05-06 DIAGNOSIS — D509 Iron deficiency anemia, unspecified: Secondary | ICD-10-CM | POA: Diagnosis not present

## 2015-05-06 DIAGNOSIS — K769 Liver disease, unspecified: Secondary | ICD-10-CM | POA: Diagnosis not present

## 2015-05-06 DIAGNOSIS — N186 End stage renal disease: Secondary | ICD-10-CM | POA: Diagnosis not present

## 2015-05-06 DIAGNOSIS — N2581 Secondary hyperparathyroidism of renal origin: Secondary | ICD-10-CM | POA: Diagnosis not present

## 2015-05-06 DIAGNOSIS — E440 Moderate protein-calorie malnutrition: Secondary | ICD-10-CM | POA: Diagnosis not present

## 2015-05-06 DIAGNOSIS — Z79899 Other long term (current) drug therapy: Secondary | ICD-10-CM | POA: Diagnosis not present

## 2015-05-07 DIAGNOSIS — E440 Moderate protein-calorie malnutrition: Secondary | ICD-10-CM | POA: Diagnosis not present

## 2015-05-07 DIAGNOSIS — K769 Liver disease, unspecified: Secondary | ICD-10-CM | POA: Diagnosis not present

## 2015-05-07 DIAGNOSIS — Z79899 Other long term (current) drug therapy: Secondary | ICD-10-CM | POA: Diagnosis not present

## 2015-05-07 DIAGNOSIS — D509 Iron deficiency anemia, unspecified: Secondary | ICD-10-CM | POA: Diagnosis not present

## 2015-05-07 DIAGNOSIS — N2581 Secondary hyperparathyroidism of renal origin: Secondary | ICD-10-CM | POA: Diagnosis not present

## 2015-05-07 DIAGNOSIS — N186 End stage renal disease: Secondary | ICD-10-CM | POA: Diagnosis not present

## 2015-05-08 DIAGNOSIS — E440 Moderate protein-calorie malnutrition: Secondary | ICD-10-CM | POA: Diagnosis not present

## 2015-05-08 DIAGNOSIS — N186 End stage renal disease: Secondary | ICD-10-CM | POA: Diagnosis not present

## 2015-05-08 DIAGNOSIS — D509 Iron deficiency anemia, unspecified: Secondary | ICD-10-CM | POA: Diagnosis not present

## 2015-05-08 DIAGNOSIS — K769 Liver disease, unspecified: Secondary | ICD-10-CM | POA: Diagnosis not present

## 2015-05-08 DIAGNOSIS — Z79899 Other long term (current) drug therapy: Secondary | ICD-10-CM | POA: Diagnosis not present

## 2015-05-08 DIAGNOSIS — N2581 Secondary hyperparathyroidism of renal origin: Secondary | ICD-10-CM | POA: Diagnosis not present

## 2015-05-09 DIAGNOSIS — D509 Iron deficiency anemia, unspecified: Secondary | ICD-10-CM | POA: Diagnosis not present

## 2015-05-09 DIAGNOSIS — E440 Moderate protein-calorie malnutrition: Secondary | ICD-10-CM | POA: Diagnosis not present

## 2015-05-09 DIAGNOSIS — Z79899 Other long term (current) drug therapy: Secondary | ICD-10-CM | POA: Diagnosis not present

## 2015-05-09 DIAGNOSIS — N186 End stage renal disease: Secondary | ICD-10-CM | POA: Diagnosis not present

## 2015-05-09 DIAGNOSIS — K769 Liver disease, unspecified: Secondary | ICD-10-CM | POA: Diagnosis not present

## 2015-05-09 DIAGNOSIS — N2581 Secondary hyperparathyroidism of renal origin: Secondary | ICD-10-CM | POA: Diagnosis not present

## 2015-05-10 DIAGNOSIS — E440 Moderate protein-calorie malnutrition: Secondary | ICD-10-CM | POA: Diagnosis not present

## 2015-05-10 DIAGNOSIS — D509 Iron deficiency anemia, unspecified: Secondary | ICD-10-CM | POA: Diagnosis not present

## 2015-05-10 DIAGNOSIS — Z79899 Other long term (current) drug therapy: Secondary | ICD-10-CM | POA: Diagnosis not present

## 2015-05-10 DIAGNOSIS — K769 Liver disease, unspecified: Secondary | ICD-10-CM | POA: Diagnosis not present

## 2015-05-10 DIAGNOSIS — N186 End stage renal disease: Secondary | ICD-10-CM | POA: Diagnosis not present

## 2015-05-10 DIAGNOSIS — N2581 Secondary hyperparathyroidism of renal origin: Secondary | ICD-10-CM | POA: Diagnosis not present

## 2015-05-11 DIAGNOSIS — N186 End stage renal disease: Secondary | ICD-10-CM | POA: Diagnosis not present

## 2015-05-11 DIAGNOSIS — N2581 Secondary hyperparathyroidism of renal origin: Secondary | ICD-10-CM | POA: Diagnosis not present

## 2015-05-11 DIAGNOSIS — E440 Moderate protein-calorie malnutrition: Secondary | ICD-10-CM | POA: Diagnosis not present

## 2015-05-11 DIAGNOSIS — K769 Liver disease, unspecified: Secondary | ICD-10-CM | POA: Diagnosis not present

## 2015-05-11 DIAGNOSIS — Z79899 Other long term (current) drug therapy: Secondary | ICD-10-CM | POA: Diagnosis not present

## 2015-05-11 DIAGNOSIS — D509 Iron deficiency anemia, unspecified: Secondary | ICD-10-CM | POA: Diagnosis not present

## 2015-05-12 DIAGNOSIS — K769 Liver disease, unspecified: Secondary | ICD-10-CM | POA: Diagnosis not present

## 2015-05-12 DIAGNOSIS — N2581 Secondary hyperparathyroidism of renal origin: Secondary | ICD-10-CM | POA: Diagnosis not present

## 2015-05-12 DIAGNOSIS — N186 End stage renal disease: Secondary | ICD-10-CM | POA: Diagnosis not present

## 2015-05-12 DIAGNOSIS — E440 Moderate protein-calorie malnutrition: Secondary | ICD-10-CM | POA: Diagnosis not present

## 2015-05-12 DIAGNOSIS — D509 Iron deficiency anemia, unspecified: Secondary | ICD-10-CM | POA: Diagnosis not present

## 2015-05-12 DIAGNOSIS — Z79899 Other long term (current) drug therapy: Secondary | ICD-10-CM | POA: Diagnosis not present

## 2015-05-13 DIAGNOSIS — N186 End stage renal disease: Secondary | ICD-10-CM | POA: Diagnosis not present

## 2015-05-13 DIAGNOSIS — Z79899 Other long term (current) drug therapy: Secondary | ICD-10-CM | POA: Diagnosis not present

## 2015-05-13 DIAGNOSIS — N2581 Secondary hyperparathyroidism of renal origin: Secondary | ICD-10-CM | POA: Diagnosis not present

## 2015-05-13 DIAGNOSIS — D509 Iron deficiency anemia, unspecified: Secondary | ICD-10-CM | POA: Diagnosis not present

## 2015-05-13 DIAGNOSIS — E440 Moderate protein-calorie malnutrition: Secondary | ICD-10-CM | POA: Diagnosis not present

## 2015-05-13 DIAGNOSIS — K769 Liver disease, unspecified: Secondary | ICD-10-CM | POA: Diagnosis not present

## 2015-05-14 DIAGNOSIS — K769 Liver disease, unspecified: Secondary | ICD-10-CM | POA: Diagnosis not present

## 2015-05-14 DIAGNOSIS — N2581 Secondary hyperparathyroidism of renal origin: Secondary | ICD-10-CM | POA: Diagnosis not present

## 2015-05-14 DIAGNOSIS — N186 End stage renal disease: Secondary | ICD-10-CM | POA: Diagnosis not present

## 2015-05-14 DIAGNOSIS — E440 Moderate protein-calorie malnutrition: Secondary | ICD-10-CM | POA: Diagnosis not present

## 2015-05-14 DIAGNOSIS — D509 Iron deficiency anemia, unspecified: Secondary | ICD-10-CM | POA: Diagnosis not present

## 2015-05-14 DIAGNOSIS — Z79899 Other long term (current) drug therapy: Secondary | ICD-10-CM | POA: Diagnosis not present

## 2015-05-15 DIAGNOSIS — D509 Iron deficiency anemia, unspecified: Secondary | ICD-10-CM | POA: Diagnosis not present

## 2015-05-15 DIAGNOSIS — N186 End stage renal disease: Secondary | ICD-10-CM | POA: Diagnosis not present

## 2015-05-15 DIAGNOSIS — E440 Moderate protein-calorie malnutrition: Secondary | ICD-10-CM | POA: Diagnosis not present

## 2015-05-15 DIAGNOSIS — N2581 Secondary hyperparathyroidism of renal origin: Secondary | ICD-10-CM | POA: Diagnosis not present

## 2015-05-15 DIAGNOSIS — K769 Liver disease, unspecified: Secondary | ICD-10-CM | POA: Diagnosis not present

## 2015-05-15 DIAGNOSIS — Z79899 Other long term (current) drug therapy: Secondary | ICD-10-CM | POA: Diagnosis not present

## 2015-05-16 DIAGNOSIS — N186 End stage renal disease: Secondary | ICD-10-CM | POA: Diagnosis not present

## 2015-05-16 DIAGNOSIS — D509 Iron deficiency anemia, unspecified: Secondary | ICD-10-CM | POA: Diagnosis not present

## 2015-05-16 DIAGNOSIS — Z79899 Other long term (current) drug therapy: Secondary | ICD-10-CM | POA: Diagnosis not present

## 2015-05-16 DIAGNOSIS — K769 Liver disease, unspecified: Secondary | ICD-10-CM | POA: Diagnosis not present

## 2015-05-16 DIAGNOSIS — E440 Moderate protein-calorie malnutrition: Secondary | ICD-10-CM | POA: Diagnosis not present

## 2015-05-16 DIAGNOSIS — N2581 Secondary hyperparathyroidism of renal origin: Secondary | ICD-10-CM | POA: Diagnosis not present

## 2015-05-17 DIAGNOSIS — K769 Liver disease, unspecified: Secondary | ICD-10-CM | POA: Diagnosis not present

## 2015-05-17 DIAGNOSIS — N186 End stage renal disease: Secondary | ICD-10-CM | POA: Diagnosis not present

## 2015-05-17 DIAGNOSIS — Z79899 Other long term (current) drug therapy: Secondary | ICD-10-CM | POA: Diagnosis not present

## 2015-05-17 DIAGNOSIS — N2581 Secondary hyperparathyroidism of renal origin: Secondary | ICD-10-CM | POA: Diagnosis not present

## 2015-05-17 DIAGNOSIS — E440 Moderate protein-calorie malnutrition: Secondary | ICD-10-CM | POA: Diagnosis not present

## 2015-05-17 DIAGNOSIS — D509 Iron deficiency anemia, unspecified: Secondary | ICD-10-CM | POA: Diagnosis not present

## 2015-05-18 DIAGNOSIS — E440 Moderate protein-calorie malnutrition: Secondary | ICD-10-CM | POA: Diagnosis not present

## 2015-05-18 DIAGNOSIS — K769 Liver disease, unspecified: Secondary | ICD-10-CM | POA: Diagnosis not present

## 2015-05-18 DIAGNOSIS — N2581 Secondary hyperparathyroidism of renal origin: Secondary | ICD-10-CM | POA: Diagnosis not present

## 2015-05-18 DIAGNOSIS — Z79899 Other long term (current) drug therapy: Secondary | ICD-10-CM | POA: Diagnosis not present

## 2015-05-18 DIAGNOSIS — N186 End stage renal disease: Secondary | ICD-10-CM | POA: Diagnosis not present

## 2015-05-18 DIAGNOSIS — D509 Iron deficiency anemia, unspecified: Secondary | ICD-10-CM | POA: Diagnosis not present

## 2015-05-19 DIAGNOSIS — K769 Liver disease, unspecified: Secondary | ICD-10-CM | POA: Diagnosis not present

## 2015-05-19 DIAGNOSIS — E440 Moderate protein-calorie malnutrition: Secondary | ICD-10-CM | POA: Diagnosis not present

## 2015-05-19 DIAGNOSIS — N186 End stage renal disease: Secondary | ICD-10-CM | POA: Diagnosis not present

## 2015-05-19 DIAGNOSIS — Z79899 Other long term (current) drug therapy: Secondary | ICD-10-CM | POA: Diagnosis not present

## 2015-05-19 DIAGNOSIS — N2581 Secondary hyperparathyroidism of renal origin: Secondary | ICD-10-CM | POA: Diagnosis not present

## 2015-05-19 DIAGNOSIS — D509 Iron deficiency anemia, unspecified: Secondary | ICD-10-CM | POA: Diagnosis not present

## 2015-05-20 DIAGNOSIS — E440 Moderate protein-calorie malnutrition: Secondary | ICD-10-CM | POA: Diagnosis not present

## 2015-05-20 DIAGNOSIS — Z79899 Other long term (current) drug therapy: Secondary | ICD-10-CM | POA: Diagnosis not present

## 2015-05-20 DIAGNOSIS — N2581 Secondary hyperparathyroidism of renal origin: Secondary | ICD-10-CM | POA: Diagnosis not present

## 2015-05-20 DIAGNOSIS — K769 Liver disease, unspecified: Secondary | ICD-10-CM | POA: Diagnosis not present

## 2015-05-20 DIAGNOSIS — D509 Iron deficiency anemia, unspecified: Secondary | ICD-10-CM | POA: Diagnosis not present

## 2015-05-20 DIAGNOSIS — N186 End stage renal disease: Secondary | ICD-10-CM | POA: Diagnosis not present

## 2015-05-21 DIAGNOSIS — D509 Iron deficiency anemia, unspecified: Secondary | ICD-10-CM | POA: Diagnosis not present

## 2015-05-21 DIAGNOSIS — Z79899 Other long term (current) drug therapy: Secondary | ICD-10-CM | POA: Diagnosis not present

## 2015-05-21 DIAGNOSIS — N186 End stage renal disease: Secondary | ICD-10-CM | POA: Diagnosis not present

## 2015-05-21 DIAGNOSIS — K769 Liver disease, unspecified: Secondary | ICD-10-CM | POA: Diagnosis not present

## 2015-05-21 DIAGNOSIS — E440 Moderate protein-calorie malnutrition: Secondary | ICD-10-CM | POA: Diagnosis not present

## 2015-05-21 DIAGNOSIS — N2581 Secondary hyperparathyroidism of renal origin: Secondary | ICD-10-CM | POA: Diagnosis not present

## 2015-05-23 DIAGNOSIS — K769 Liver disease, unspecified: Secondary | ICD-10-CM | POA: Diagnosis not present

## 2015-05-23 DIAGNOSIS — N2581 Secondary hyperparathyroidism of renal origin: Secondary | ICD-10-CM | POA: Diagnosis not present

## 2015-05-23 DIAGNOSIS — E440 Moderate protein-calorie malnutrition: Secondary | ICD-10-CM | POA: Diagnosis not present

## 2015-05-23 DIAGNOSIS — Z79899 Other long term (current) drug therapy: Secondary | ICD-10-CM | POA: Diagnosis not present

## 2015-05-23 DIAGNOSIS — N186 End stage renal disease: Secondary | ICD-10-CM | POA: Diagnosis not present

## 2015-05-23 DIAGNOSIS — D509 Iron deficiency anemia, unspecified: Secondary | ICD-10-CM | POA: Diagnosis not present

## 2015-05-24 DIAGNOSIS — K769 Liver disease, unspecified: Secondary | ICD-10-CM | POA: Diagnosis not present

## 2015-05-24 DIAGNOSIS — Z79899 Other long term (current) drug therapy: Secondary | ICD-10-CM | POA: Diagnosis not present

## 2015-05-24 DIAGNOSIS — N186 End stage renal disease: Secondary | ICD-10-CM | POA: Diagnosis not present

## 2015-05-24 DIAGNOSIS — E440 Moderate protein-calorie malnutrition: Secondary | ICD-10-CM | POA: Diagnosis not present

## 2015-05-24 DIAGNOSIS — N2581 Secondary hyperparathyroidism of renal origin: Secondary | ICD-10-CM | POA: Diagnosis not present

## 2015-05-24 DIAGNOSIS — D509 Iron deficiency anemia, unspecified: Secondary | ICD-10-CM | POA: Diagnosis not present

## 2015-05-25 DIAGNOSIS — K769 Liver disease, unspecified: Secondary | ICD-10-CM | POA: Diagnosis not present

## 2015-05-25 DIAGNOSIS — E440 Moderate protein-calorie malnutrition: Secondary | ICD-10-CM | POA: Diagnosis not present

## 2015-05-25 DIAGNOSIS — N186 End stage renal disease: Secondary | ICD-10-CM | POA: Diagnosis not present

## 2015-05-25 DIAGNOSIS — D509 Iron deficiency anemia, unspecified: Secondary | ICD-10-CM | POA: Diagnosis not present

## 2015-05-25 DIAGNOSIS — N2581 Secondary hyperparathyroidism of renal origin: Secondary | ICD-10-CM | POA: Diagnosis not present

## 2015-05-25 DIAGNOSIS — Z79899 Other long term (current) drug therapy: Secondary | ICD-10-CM | POA: Diagnosis not present

## 2015-05-26 DIAGNOSIS — K769 Liver disease, unspecified: Secondary | ICD-10-CM | POA: Diagnosis not present

## 2015-05-26 DIAGNOSIS — N2581 Secondary hyperparathyroidism of renal origin: Secondary | ICD-10-CM | POA: Diagnosis not present

## 2015-05-26 DIAGNOSIS — Z79899 Other long term (current) drug therapy: Secondary | ICD-10-CM | POA: Diagnosis not present

## 2015-05-26 DIAGNOSIS — N186 End stage renal disease: Secondary | ICD-10-CM | POA: Diagnosis not present

## 2015-05-26 DIAGNOSIS — D509 Iron deficiency anemia, unspecified: Secondary | ICD-10-CM | POA: Diagnosis not present

## 2015-05-26 DIAGNOSIS — E440 Moderate protein-calorie malnutrition: Secondary | ICD-10-CM | POA: Diagnosis not present

## 2015-05-27 DIAGNOSIS — Z79899 Other long term (current) drug therapy: Secondary | ICD-10-CM | POA: Diagnosis not present

## 2015-05-27 DIAGNOSIS — N2581 Secondary hyperparathyroidism of renal origin: Secondary | ICD-10-CM | POA: Diagnosis not present

## 2015-05-27 DIAGNOSIS — K769 Liver disease, unspecified: Secondary | ICD-10-CM | POA: Diagnosis not present

## 2015-05-27 DIAGNOSIS — N186 End stage renal disease: Secondary | ICD-10-CM | POA: Diagnosis not present

## 2015-05-27 DIAGNOSIS — Z992 Dependence on renal dialysis: Secondary | ICD-10-CM | POA: Diagnosis not present

## 2015-05-27 DIAGNOSIS — E440 Moderate protein-calorie malnutrition: Secondary | ICD-10-CM | POA: Diagnosis not present

## 2015-05-27 DIAGNOSIS — D509 Iron deficiency anemia, unspecified: Secondary | ICD-10-CM | POA: Diagnosis not present

## 2015-05-27 DIAGNOSIS — I129 Hypertensive chronic kidney disease with stage 1 through stage 4 chronic kidney disease, or unspecified chronic kidney disease: Secondary | ICD-10-CM | POA: Diagnosis not present

## 2015-05-28 DIAGNOSIS — K769 Liver disease, unspecified: Secondary | ICD-10-CM | POA: Diagnosis not present

## 2015-05-28 DIAGNOSIS — D631 Anemia in chronic kidney disease: Secondary | ICD-10-CM | POA: Diagnosis not present

## 2015-05-28 DIAGNOSIS — N186 End stage renal disease: Secondary | ICD-10-CM | POA: Diagnosis not present

## 2015-05-28 DIAGNOSIS — N2581 Secondary hyperparathyroidism of renal origin: Secondary | ICD-10-CM | POA: Diagnosis not present

## 2015-05-28 DIAGNOSIS — D509 Iron deficiency anemia, unspecified: Secondary | ICD-10-CM | POA: Diagnosis not present

## 2015-05-28 DIAGNOSIS — E440 Moderate protein-calorie malnutrition: Secondary | ICD-10-CM | POA: Diagnosis not present

## 2015-05-28 DIAGNOSIS — N2589 Other disorders resulting from impaired renal tubular function: Secondary | ICD-10-CM | POA: Diagnosis not present

## 2015-05-28 DIAGNOSIS — Z4932 Encounter for adequacy testing for peritoneal dialysis: Secondary | ICD-10-CM | POA: Diagnosis not present

## 2015-05-29 DIAGNOSIS — D631 Anemia in chronic kidney disease: Secondary | ICD-10-CM | POA: Diagnosis not present

## 2015-05-29 DIAGNOSIS — N186 End stage renal disease: Secondary | ICD-10-CM | POA: Diagnosis not present

## 2015-05-29 DIAGNOSIS — N2581 Secondary hyperparathyroidism of renal origin: Secondary | ICD-10-CM | POA: Diagnosis not present

## 2015-05-29 DIAGNOSIS — D509 Iron deficiency anemia, unspecified: Secondary | ICD-10-CM | POA: Diagnosis not present

## 2015-05-29 DIAGNOSIS — K769 Liver disease, unspecified: Secondary | ICD-10-CM | POA: Diagnosis not present

## 2015-05-29 DIAGNOSIS — N2589 Other disorders resulting from impaired renal tubular function: Secondary | ICD-10-CM | POA: Diagnosis not present

## 2015-05-30 DIAGNOSIS — N2589 Other disorders resulting from impaired renal tubular function: Secondary | ICD-10-CM | POA: Diagnosis not present

## 2015-05-30 DIAGNOSIS — N186 End stage renal disease: Secondary | ICD-10-CM | POA: Diagnosis not present

## 2015-05-30 DIAGNOSIS — K769 Liver disease, unspecified: Secondary | ICD-10-CM | POA: Diagnosis not present

## 2015-05-30 DIAGNOSIS — D509 Iron deficiency anemia, unspecified: Secondary | ICD-10-CM | POA: Diagnosis not present

## 2015-05-30 DIAGNOSIS — N2581 Secondary hyperparathyroidism of renal origin: Secondary | ICD-10-CM | POA: Diagnosis not present

## 2015-05-30 DIAGNOSIS — D631 Anemia in chronic kidney disease: Secondary | ICD-10-CM | POA: Diagnosis not present

## 2015-05-31 DIAGNOSIS — N2581 Secondary hyperparathyroidism of renal origin: Secondary | ICD-10-CM | POA: Diagnosis not present

## 2015-05-31 DIAGNOSIS — D509 Iron deficiency anemia, unspecified: Secondary | ICD-10-CM | POA: Diagnosis not present

## 2015-05-31 DIAGNOSIS — N186 End stage renal disease: Secondary | ICD-10-CM | POA: Diagnosis not present

## 2015-05-31 DIAGNOSIS — D631 Anemia in chronic kidney disease: Secondary | ICD-10-CM | POA: Diagnosis not present

## 2015-05-31 DIAGNOSIS — K769 Liver disease, unspecified: Secondary | ICD-10-CM | POA: Diagnosis not present

## 2015-05-31 DIAGNOSIS — N2589 Other disorders resulting from impaired renal tubular function: Secondary | ICD-10-CM | POA: Diagnosis not present

## 2015-06-01 DIAGNOSIS — N2581 Secondary hyperparathyroidism of renal origin: Secondary | ICD-10-CM | POA: Diagnosis not present

## 2015-06-01 DIAGNOSIS — K769 Liver disease, unspecified: Secondary | ICD-10-CM | POA: Diagnosis not present

## 2015-06-01 DIAGNOSIS — N186 End stage renal disease: Secondary | ICD-10-CM | POA: Diagnosis not present

## 2015-06-01 DIAGNOSIS — D631 Anemia in chronic kidney disease: Secondary | ICD-10-CM | POA: Diagnosis not present

## 2015-06-01 DIAGNOSIS — D509 Iron deficiency anemia, unspecified: Secondary | ICD-10-CM | POA: Diagnosis not present

## 2015-06-01 DIAGNOSIS — N2589 Other disorders resulting from impaired renal tubular function: Secondary | ICD-10-CM | POA: Diagnosis not present

## 2015-06-02 DIAGNOSIS — N186 End stage renal disease: Secondary | ICD-10-CM | POA: Diagnosis not present

## 2015-06-02 DIAGNOSIS — N2589 Other disorders resulting from impaired renal tubular function: Secondary | ICD-10-CM | POA: Diagnosis not present

## 2015-06-02 DIAGNOSIS — N2581 Secondary hyperparathyroidism of renal origin: Secondary | ICD-10-CM | POA: Diagnosis not present

## 2015-06-02 DIAGNOSIS — D631 Anemia in chronic kidney disease: Secondary | ICD-10-CM | POA: Diagnosis not present

## 2015-06-02 DIAGNOSIS — D509 Iron deficiency anemia, unspecified: Secondary | ICD-10-CM | POA: Diagnosis not present

## 2015-06-02 DIAGNOSIS — K769 Liver disease, unspecified: Secondary | ICD-10-CM | POA: Diagnosis not present

## 2015-06-03 DIAGNOSIS — D509 Iron deficiency anemia, unspecified: Secondary | ICD-10-CM | POA: Diagnosis not present

## 2015-06-03 DIAGNOSIS — N186 End stage renal disease: Secondary | ICD-10-CM | POA: Diagnosis not present

## 2015-06-03 DIAGNOSIS — K769 Liver disease, unspecified: Secondary | ICD-10-CM | POA: Diagnosis not present

## 2015-06-03 DIAGNOSIS — D631 Anemia in chronic kidney disease: Secondary | ICD-10-CM | POA: Diagnosis not present

## 2015-06-03 DIAGNOSIS — N2581 Secondary hyperparathyroidism of renal origin: Secondary | ICD-10-CM | POA: Diagnosis not present

## 2015-06-03 DIAGNOSIS — N2589 Other disorders resulting from impaired renal tubular function: Secondary | ICD-10-CM | POA: Diagnosis not present

## 2015-06-04 DIAGNOSIS — N186 End stage renal disease: Secondary | ICD-10-CM | POA: Diagnosis not present

## 2015-06-04 DIAGNOSIS — K769 Liver disease, unspecified: Secondary | ICD-10-CM | POA: Diagnosis not present

## 2015-06-04 DIAGNOSIS — D509 Iron deficiency anemia, unspecified: Secondary | ICD-10-CM | POA: Diagnosis not present

## 2015-06-04 DIAGNOSIS — N2581 Secondary hyperparathyroidism of renal origin: Secondary | ICD-10-CM | POA: Diagnosis not present

## 2015-06-04 DIAGNOSIS — N2589 Other disorders resulting from impaired renal tubular function: Secondary | ICD-10-CM | POA: Diagnosis not present

## 2015-06-04 DIAGNOSIS — D631 Anemia in chronic kidney disease: Secondary | ICD-10-CM | POA: Diagnosis not present

## 2015-06-05 DIAGNOSIS — D509 Iron deficiency anemia, unspecified: Secondary | ICD-10-CM | POA: Diagnosis not present

## 2015-06-05 DIAGNOSIS — N2581 Secondary hyperparathyroidism of renal origin: Secondary | ICD-10-CM | POA: Diagnosis not present

## 2015-06-05 DIAGNOSIS — N2589 Other disorders resulting from impaired renal tubular function: Secondary | ICD-10-CM | POA: Diagnosis not present

## 2015-06-05 DIAGNOSIS — K769 Liver disease, unspecified: Secondary | ICD-10-CM | POA: Diagnosis not present

## 2015-06-05 DIAGNOSIS — N186 End stage renal disease: Secondary | ICD-10-CM | POA: Diagnosis not present

## 2015-06-05 DIAGNOSIS — D631 Anemia in chronic kidney disease: Secondary | ICD-10-CM | POA: Diagnosis not present

## 2015-06-06 DIAGNOSIS — D631 Anemia in chronic kidney disease: Secondary | ICD-10-CM | POA: Diagnosis not present

## 2015-06-06 DIAGNOSIS — K769 Liver disease, unspecified: Secondary | ICD-10-CM | POA: Diagnosis not present

## 2015-06-06 DIAGNOSIS — N2589 Other disorders resulting from impaired renal tubular function: Secondary | ICD-10-CM | POA: Diagnosis not present

## 2015-06-06 DIAGNOSIS — N2581 Secondary hyperparathyroidism of renal origin: Secondary | ICD-10-CM | POA: Diagnosis not present

## 2015-06-06 DIAGNOSIS — N186 End stage renal disease: Secondary | ICD-10-CM | POA: Diagnosis not present

## 2015-06-06 DIAGNOSIS — D509 Iron deficiency anemia, unspecified: Secondary | ICD-10-CM | POA: Diagnosis not present

## 2015-06-07 DIAGNOSIS — D509 Iron deficiency anemia, unspecified: Secondary | ICD-10-CM | POA: Diagnosis not present

## 2015-06-07 DIAGNOSIS — N2589 Other disorders resulting from impaired renal tubular function: Secondary | ICD-10-CM | POA: Diagnosis not present

## 2015-06-07 DIAGNOSIS — E1129 Type 2 diabetes mellitus with other diabetic kidney complication: Secondary | ICD-10-CM | POA: Diagnosis not present

## 2015-06-07 DIAGNOSIS — R8299 Other abnormal findings in urine: Secondary | ICD-10-CM | POA: Diagnosis not present

## 2015-06-07 DIAGNOSIS — N186 End stage renal disease: Secondary | ICD-10-CM | POA: Diagnosis not present

## 2015-06-07 DIAGNOSIS — K769 Liver disease, unspecified: Secondary | ICD-10-CM | POA: Diagnosis not present

## 2015-06-07 DIAGNOSIS — E784 Other hyperlipidemia: Secondary | ICD-10-CM | POA: Diagnosis not present

## 2015-06-07 DIAGNOSIS — D631 Anemia in chronic kidney disease: Secondary | ICD-10-CM | POA: Diagnosis not present

## 2015-06-07 DIAGNOSIS — N2581 Secondary hyperparathyroidism of renal origin: Secondary | ICD-10-CM | POA: Diagnosis not present

## 2015-06-08 DIAGNOSIS — N2589 Other disorders resulting from impaired renal tubular function: Secondary | ICD-10-CM | POA: Diagnosis not present

## 2015-06-08 DIAGNOSIS — K769 Liver disease, unspecified: Secondary | ICD-10-CM | POA: Diagnosis not present

## 2015-06-08 DIAGNOSIS — D631 Anemia in chronic kidney disease: Secondary | ICD-10-CM | POA: Diagnosis not present

## 2015-06-08 DIAGNOSIS — D509 Iron deficiency anemia, unspecified: Secondary | ICD-10-CM | POA: Diagnosis not present

## 2015-06-08 DIAGNOSIS — N2581 Secondary hyperparathyroidism of renal origin: Secondary | ICD-10-CM | POA: Diagnosis not present

## 2015-06-08 DIAGNOSIS — N186 End stage renal disease: Secondary | ICD-10-CM | POA: Diagnosis not present

## 2015-06-09 DIAGNOSIS — N2589 Other disorders resulting from impaired renal tubular function: Secondary | ICD-10-CM | POA: Diagnosis not present

## 2015-06-09 DIAGNOSIS — N186 End stage renal disease: Secondary | ICD-10-CM | POA: Diagnosis not present

## 2015-06-09 DIAGNOSIS — N2581 Secondary hyperparathyroidism of renal origin: Secondary | ICD-10-CM | POA: Diagnosis not present

## 2015-06-09 DIAGNOSIS — D631 Anemia in chronic kidney disease: Secondary | ICD-10-CM | POA: Diagnosis not present

## 2015-06-09 DIAGNOSIS — K769 Liver disease, unspecified: Secondary | ICD-10-CM | POA: Diagnosis not present

## 2015-06-09 DIAGNOSIS — D509 Iron deficiency anemia, unspecified: Secondary | ICD-10-CM | POA: Diagnosis not present

## 2015-06-10 DIAGNOSIS — D509 Iron deficiency anemia, unspecified: Secondary | ICD-10-CM | POA: Diagnosis not present

## 2015-06-10 DIAGNOSIS — N2589 Other disorders resulting from impaired renal tubular function: Secondary | ICD-10-CM | POA: Diagnosis not present

## 2015-06-10 DIAGNOSIS — N2581 Secondary hyperparathyroidism of renal origin: Secondary | ICD-10-CM | POA: Diagnosis not present

## 2015-06-10 DIAGNOSIS — K769 Liver disease, unspecified: Secondary | ICD-10-CM | POA: Diagnosis not present

## 2015-06-10 DIAGNOSIS — N186 End stage renal disease: Secondary | ICD-10-CM | POA: Diagnosis not present

## 2015-06-10 DIAGNOSIS — D631 Anemia in chronic kidney disease: Secondary | ICD-10-CM | POA: Diagnosis not present

## 2015-06-11 DIAGNOSIS — N2581 Secondary hyperparathyroidism of renal origin: Secondary | ICD-10-CM | POA: Diagnosis not present

## 2015-06-11 DIAGNOSIS — K769 Liver disease, unspecified: Secondary | ICD-10-CM | POA: Diagnosis not present

## 2015-06-11 DIAGNOSIS — N2589 Other disorders resulting from impaired renal tubular function: Secondary | ICD-10-CM | POA: Diagnosis not present

## 2015-06-11 DIAGNOSIS — D631 Anemia in chronic kidney disease: Secondary | ICD-10-CM | POA: Diagnosis not present

## 2015-06-11 DIAGNOSIS — N186 End stage renal disease: Secondary | ICD-10-CM | POA: Diagnosis not present

## 2015-06-11 DIAGNOSIS — D509 Iron deficiency anemia, unspecified: Secondary | ICD-10-CM | POA: Diagnosis not present

## 2015-06-12 DIAGNOSIS — N2581 Secondary hyperparathyroidism of renal origin: Secondary | ICD-10-CM | POA: Diagnosis not present

## 2015-06-12 DIAGNOSIS — D509 Iron deficiency anemia, unspecified: Secondary | ICD-10-CM | POA: Diagnosis not present

## 2015-06-12 DIAGNOSIS — N186 End stage renal disease: Secondary | ICD-10-CM | POA: Diagnosis not present

## 2015-06-12 DIAGNOSIS — N2589 Other disorders resulting from impaired renal tubular function: Secondary | ICD-10-CM | POA: Diagnosis not present

## 2015-06-12 DIAGNOSIS — D631 Anemia in chronic kidney disease: Secondary | ICD-10-CM | POA: Diagnosis not present

## 2015-06-12 DIAGNOSIS — K769 Liver disease, unspecified: Secondary | ICD-10-CM | POA: Diagnosis not present

## 2015-06-13 DIAGNOSIS — K769 Liver disease, unspecified: Secondary | ICD-10-CM | POA: Diagnosis not present

## 2015-06-13 DIAGNOSIS — D509 Iron deficiency anemia, unspecified: Secondary | ICD-10-CM | POA: Diagnosis not present

## 2015-06-13 DIAGNOSIS — N186 End stage renal disease: Secondary | ICD-10-CM | POA: Diagnosis not present

## 2015-06-13 DIAGNOSIS — N2581 Secondary hyperparathyroidism of renal origin: Secondary | ICD-10-CM | POA: Diagnosis not present

## 2015-06-13 DIAGNOSIS — D631 Anemia in chronic kidney disease: Secondary | ICD-10-CM | POA: Diagnosis not present

## 2015-06-13 DIAGNOSIS — N2589 Other disorders resulting from impaired renal tubular function: Secondary | ICD-10-CM | POA: Diagnosis not present

## 2015-06-14 DIAGNOSIS — N186 End stage renal disease: Secondary | ICD-10-CM | POA: Diagnosis not present

## 2015-06-14 DIAGNOSIS — D509 Iron deficiency anemia, unspecified: Secondary | ICD-10-CM | POA: Diagnosis not present

## 2015-06-14 DIAGNOSIS — K769 Liver disease, unspecified: Secondary | ICD-10-CM | POA: Diagnosis not present

## 2015-06-14 DIAGNOSIS — D631 Anemia in chronic kidney disease: Secondary | ICD-10-CM | POA: Diagnosis not present

## 2015-06-14 DIAGNOSIS — N2581 Secondary hyperparathyroidism of renal origin: Secondary | ICD-10-CM | POA: Diagnosis not present

## 2015-06-14 DIAGNOSIS — N2589 Other disorders resulting from impaired renal tubular function: Secondary | ICD-10-CM | POA: Diagnosis not present

## 2015-06-15 DIAGNOSIS — N186 End stage renal disease: Secondary | ICD-10-CM | POA: Diagnosis not present

## 2015-06-15 DIAGNOSIS — D631 Anemia in chronic kidney disease: Secondary | ICD-10-CM | POA: Diagnosis not present

## 2015-06-15 DIAGNOSIS — N2581 Secondary hyperparathyroidism of renal origin: Secondary | ICD-10-CM | POA: Diagnosis not present

## 2015-06-15 DIAGNOSIS — K769 Liver disease, unspecified: Secondary | ICD-10-CM | POA: Diagnosis not present

## 2015-06-15 DIAGNOSIS — D509 Iron deficiency anemia, unspecified: Secondary | ICD-10-CM | POA: Diagnosis not present

## 2015-06-15 DIAGNOSIS — N2589 Other disorders resulting from impaired renal tubular function: Secondary | ICD-10-CM | POA: Diagnosis not present

## 2015-06-16 DIAGNOSIS — N2581 Secondary hyperparathyroidism of renal origin: Secondary | ICD-10-CM | POA: Diagnosis not present

## 2015-06-16 DIAGNOSIS — D509 Iron deficiency anemia, unspecified: Secondary | ICD-10-CM | POA: Diagnosis not present

## 2015-06-16 DIAGNOSIS — N186 End stage renal disease: Secondary | ICD-10-CM | POA: Diagnosis not present

## 2015-06-16 DIAGNOSIS — D631 Anemia in chronic kidney disease: Secondary | ICD-10-CM | POA: Diagnosis not present

## 2015-06-16 DIAGNOSIS — N2589 Other disorders resulting from impaired renal tubular function: Secondary | ICD-10-CM | POA: Diagnosis not present

## 2015-06-16 DIAGNOSIS — K769 Liver disease, unspecified: Secondary | ICD-10-CM | POA: Diagnosis not present

## 2015-06-17 DIAGNOSIS — N2589 Other disorders resulting from impaired renal tubular function: Secondary | ICD-10-CM | POA: Diagnosis not present

## 2015-06-17 DIAGNOSIS — N2581 Secondary hyperparathyroidism of renal origin: Secondary | ICD-10-CM | POA: Diagnosis not present

## 2015-06-17 DIAGNOSIS — K769 Liver disease, unspecified: Secondary | ICD-10-CM | POA: Diagnosis not present

## 2015-06-17 DIAGNOSIS — D509 Iron deficiency anemia, unspecified: Secondary | ICD-10-CM | POA: Diagnosis not present

## 2015-06-17 DIAGNOSIS — D631 Anemia in chronic kidney disease: Secondary | ICD-10-CM | POA: Diagnosis not present

## 2015-06-17 DIAGNOSIS — N186 End stage renal disease: Secondary | ICD-10-CM | POA: Diagnosis not present

## 2015-06-18 DIAGNOSIS — N2581 Secondary hyperparathyroidism of renal origin: Secondary | ICD-10-CM | POA: Diagnosis not present

## 2015-06-18 DIAGNOSIS — D631 Anemia in chronic kidney disease: Secondary | ICD-10-CM | POA: Diagnosis not present

## 2015-06-18 DIAGNOSIS — K769 Liver disease, unspecified: Secondary | ICD-10-CM | POA: Diagnosis not present

## 2015-06-18 DIAGNOSIS — D509 Iron deficiency anemia, unspecified: Secondary | ICD-10-CM | POA: Diagnosis not present

## 2015-06-18 DIAGNOSIS — N186 End stage renal disease: Secondary | ICD-10-CM | POA: Diagnosis not present

## 2015-06-18 DIAGNOSIS — N2589 Other disorders resulting from impaired renal tubular function: Secondary | ICD-10-CM | POA: Diagnosis not present

## 2015-06-19 DIAGNOSIS — N2581 Secondary hyperparathyroidism of renal origin: Secondary | ICD-10-CM | POA: Diagnosis not present

## 2015-06-19 DIAGNOSIS — N2589 Other disorders resulting from impaired renal tubular function: Secondary | ICD-10-CM | POA: Diagnosis not present

## 2015-06-19 DIAGNOSIS — D631 Anemia in chronic kidney disease: Secondary | ICD-10-CM | POA: Diagnosis not present

## 2015-06-19 DIAGNOSIS — N186 End stage renal disease: Secondary | ICD-10-CM | POA: Diagnosis not present

## 2015-06-19 DIAGNOSIS — K769 Liver disease, unspecified: Secondary | ICD-10-CM | POA: Diagnosis not present

## 2015-06-19 DIAGNOSIS — D509 Iron deficiency anemia, unspecified: Secondary | ICD-10-CM | POA: Diagnosis not present

## 2015-06-20 DIAGNOSIS — D509 Iron deficiency anemia, unspecified: Secondary | ICD-10-CM | POA: Diagnosis not present

## 2015-06-20 DIAGNOSIS — K769 Liver disease, unspecified: Secondary | ICD-10-CM | POA: Diagnosis not present

## 2015-06-20 DIAGNOSIS — N186 End stage renal disease: Secondary | ICD-10-CM | POA: Diagnosis not present

## 2015-06-20 DIAGNOSIS — N2589 Other disorders resulting from impaired renal tubular function: Secondary | ICD-10-CM | POA: Diagnosis not present

## 2015-06-20 DIAGNOSIS — D631 Anemia in chronic kidney disease: Secondary | ICD-10-CM | POA: Diagnosis not present

## 2015-06-20 DIAGNOSIS — N2581 Secondary hyperparathyroidism of renal origin: Secondary | ICD-10-CM | POA: Diagnosis not present

## 2015-06-21 DIAGNOSIS — N2589 Other disorders resulting from impaired renal tubular function: Secondary | ICD-10-CM | POA: Diagnosis not present

## 2015-06-21 DIAGNOSIS — N186 End stage renal disease: Secondary | ICD-10-CM | POA: Diagnosis not present

## 2015-06-21 DIAGNOSIS — D631 Anemia in chronic kidney disease: Secondary | ICD-10-CM | POA: Diagnosis not present

## 2015-06-21 DIAGNOSIS — D509 Iron deficiency anemia, unspecified: Secondary | ICD-10-CM | POA: Diagnosis not present

## 2015-06-21 DIAGNOSIS — K769 Liver disease, unspecified: Secondary | ICD-10-CM | POA: Diagnosis not present

## 2015-06-21 DIAGNOSIS — N2581 Secondary hyperparathyroidism of renal origin: Secondary | ICD-10-CM | POA: Diagnosis not present

## 2015-06-22 DIAGNOSIS — N2581 Secondary hyperparathyroidism of renal origin: Secondary | ICD-10-CM | POA: Diagnosis not present

## 2015-06-22 DIAGNOSIS — N186 End stage renal disease: Secondary | ICD-10-CM | POA: Diagnosis not present

## 2015-06-22 DIAGNOSIS — N2589 Other disorders resulting from impaired renal tubular function: Secondary | ICD-10-CM | POA: Diagnosis not present

## 2015-06-22 DIAGNOSIS — D509 Iron deficiency anemia, unspecified: Secondary | ICD-10-CM | POA: Diagnosis not present

## 2015-06-22 DIAGNOSIS — K769 Liver disease, unspecified: Secondary | ICD-10-CM | POA: Diagnosis not present

## 2015-06-22 DIAGNOSIS — D631 Anemia in chronic kidney disease: Secondary | ICD-10-CM | POA: Diagnosis not present

## 2015-06-23 DIAGNOSIS — N2589 Other disorders resulting from impaired renal tubular function: Secondary | ICD-10-CM | POA: Diagnosis not present

## 2015-06-23 DIAGNOSIS — K769 Liver disease, unspecified: Secondary | ICD-10-CM | POA: Diagnosis not present

## 2015-06-23 DIAGNOSIS — D631 Anemia in chronic kidney disease: Secondary | ICD-10-CM | POA: Diagnosis not present

## 2015-06-23 DIAGNOSIS — N186 End stage renal disease: Secondary | ICD-10-CM | POA: Diagnosis not present

## 2015-06-23 DIAGNOSIS — N2581 Secondary hyperparathyroidism of renal origin: Secondary | ICD-10-CM | POA: Diagnosis not present

## 2015-06-23 DIAGNOSIS — D509 Iron deficiency anemia, unspecified: Secondary | ICD-10-CM | POA: Diagnosis not present

## 2015-06-24 DIAGNOSIS — N2589 Other disorders resulting from impaired renal tubular function: Secondary | ICD-10-CM | POA: Diagnosis not present

## 2015-06-24 DIAGNOSIS — D631 Anemia in chronic kidney disease: Secondary | ICD-10-CM | POA: Diagnosis not present

## 2015-06-24 DIAGNOSIS — N2581 Secondary hyperparathyroidism of renal origin: Secondary | ICD-10-CM | POA: Diagnosis not present

## 2015-06-24 DIAGNOSIS — D509 Iron deficiency anemia, unspecified: Secondary | ICD-10-CM | POA: Diagnosis not present

## 2015-06-24 DIAGNOSIS — N186 End stage renal disease: Secondary | ICD-10-CM | POA: Diagnosis not present

## 2015-06-24 DIAGNOSIS — K769 Liver disease, unspecified: Secondary | ICD-10-CM | POA: Diagnosis not present

## 2015-06-25 DIAGNOSIS — D631 Anemia in chronic kidney disease: Secondary | ICD-10-CM | POA: Diagnosis not present

## 2015-06-25 DIAGNOSIS — N186 End stage renal disease: Secondary | ICD-10-CM | POA: Diagnosis not present

## 2015-06-25 DIAGNOSIS — D509 Iron deficiency anemia, unspecified: Secondary | ICD-10-CM | POA: Diagnosis not present

## 2015-06-25 DIAGNOSIS — K769 Liver disease, unspecified: Secondary | ICD-10-CM | POA: Diagnosis not present

## 2015-06-25 DIAGNOSIS — N2581 Secondary hyperparathyroidism of renal origin: Secondary | ICD-10-CM | POA: Diagnosis not present

## 2015-06-25 DIAGNOSIS — N2589 Other disorders resulting from impaired renal tubular function: Secondary | ICD-10-CM | POA: Diagnosis not present

## 2015-06-26 DIAGNOSIS — N2581 Secondary hyperparathyroidism of renal origin: Secondary | ICD-10-CM | POA: Diagnosis not present

## 2015-06-26 DIAGNOSIS — N186 End stage renal disease: Secondary | ICD-10-CM | POA: Diagnosis not present

## 2015-06-26 DIAGNOSIS — D631 Anemia in chronic kidney disease: Secondary | ICD-10-CM | POA: Diagnosis not present

## 2015-06-26 DIAGNOSIS — N2589 Other disorders resulting from impaired renal tubular function: Secondary | ICD-10-CM | POA: Diagnosis not present

## 2015-06-26 DIAGNOSIS — K769 Liver disease, unspecified: Secondary | ICD-10-CM | POA: Diagnosis not present

## 2015-06-26 DIAGNOSIS — D509 Iron deficiency anemia, unspecified: Secondary | ICD-10-CM | POA: Diagnosis not present

## 2015-06-27 DIAGNOSIS — N186 End stage renal disease: Secondary | ICD-10-CM | POA: Diagnosis not present

## 2015-06-27 DIAGNOSIS — N2589 Other disorders resulting from impaired renal tubular function: Secondary | ICD-10-CM | POA: Diagnosis not present

## 2015-06-27 DIAGNOSIS — K769 Liver disease, unspecified: Secondary | ICD-10-CM | POA: Diagnosis not present

## 2015-06-27 DIAGNOSIS — I129 Hypertensive chronic kidney disease with stage 1 through stage 4 chronic kidney disease, or unspecified chronic kidney disease: Secondary | ICD-10-CM | POA: Diagnosis not present

## 2015-06-27 DIAGNOSIS — N2581 Secondary hyperparathyroidism of renal origin: Secondary | ICD-10-CM | POA: Diagnosis not present

## 2015-06-27 DIAGNOSIS — D631 Anemia in chronic kidney disease: Secondary | ICD-10-CM | POA: Diagnosis not present

## 2015-06-27 DIAGNOSIS — D509 Iron deficiency anemia, unspecified: Secondary | ICD-10-CM | POA: Diagnosis not present

## 2015-06-27 DIAGNOSIS — Z992 Dependence on renal dialysis: Secondary | ICD-10-CM | POA: Diagnosis not present

## 2015-06-28 DIAGNOSIS — E440 Moderate protein-calorie malnutrition: Secondary | ICD-10-CM | POA: Diagnosis not present

## 2015-06-28 DIAGNOSIS — Z79899 Other long term (current) drug therapy: Secondary | ICD-10-CM | POA: Diagnosis not present

## 2015-06-28 DIAGNOSIS — N186 End stage renal disease: Secondary | ICD-10-CM | POA: Diagnosis not present

## 2015-06-28 DIAGNOSIS — N2581 Secondary hyperparathyroidism of renal origin: Secondary | ICD-10-CM | POA: Diagnosis not present

## 2015-06-28 DIAGNOSIS — D631 Anemia in chronic kidney disease: Secondary | ICD-10-CM | POA: Diagnosis not present

## 2015-06-28 DIAGNOSIS — Z5189 Encounter for other specified aftercare: Secondary | ICD-10-CM | POA: Diagnosis not present

## 2015-06-28 DIAGNOSIS — D509 Iron deficiency anemia, unspecified: Secondary | ICD-10-CM | POA: Diagnosis not present

## 2015-06-28 DIAGNOSIS — Z992 Dependence on renal dialysis: Secondary | ICD-10-CM | POA: Diagnosis not present

## 2015-06-29 DIAGNOSIS — Z5189 Encounter for other specified aftercare: Secondary | ICD-10-CM | POA: Diagnosis not present

## 2015-06-29 DIAGNOSIS — Z79899 Other long term (current) drug therapy: Secondary | ICD-10-CM | POA: Diagnosis not present

## 2015-06-29 DIAGNOSIS — N186 End stage renal disease: Secondary | ICD-10-CM | POA: Diagnosis not present

## 2015-06-29 DIAGNOSIS — D509 Iron deficiency anemia, unspecified: Secondary | ICD-10-CM | POA: Diagnosis not present

## 2015-06-29 DIAGNOSIS — D631 Anemia in chronic kidney disease: Secondary | ICD-10-CM | POA: Diagnosis not present

## 2015-06-29 DIAGNOSIS — E440 Moderate protein-calorie malnutrition: Secondary | ICD-10-CM | POA: Diagnosis not present

## 2015-06-30 DIAGNOSIS — E440 Moderate protein-calorie malnutrition: Secondary | ICD-10-CM | POA: Diagnosis not present

## 2015-06-30 DIAGNOSIS — Z79899 Other long term (current) drug therapy: Secondary | ICD-10-CM | POA: Diagnosis not present

## 2015-06-30 DIAGNOSIS — R8299 Other abnormal findings in urine: Secondary | ICD-10-CM | POA: Diagnosis not present

## 2015-06-30 DIAGNOSIS — N186 End stage renal disease: Secondary | ICD-10-CM | POA: Diagnosis not present

## 2015-06-30 DIAGNOSIS — Z5189 Encounter for other specified aftercare: Secondary | ICD-10-CM | POA: Diagnosis not present

## 2015-06-30 DIAGNOSIS — D631 Anemia in chronic kidney disease: Secondary | ICD-10-CM | POA: Diagnosis not present

## 2015-06-30 DIAGNOSIS — D509 Iron deficiency anemia, unspecified: Secondary | ICD-10-CM | POA: Diagnosis not present

## 2015-07-01 DIAGNOSIS — Z5189 Encounter for other specified aftercare: Secondary | ICD-10-CM | POA: Diagnosis not present

## 2015-07-01 DIAGNOSIS — N186 End stage renal disease: Secondary | ICD-10-CM | POA: Diagnosis not present

## 2015-07-01 DIAGNOSIS — D509 Iron deficiency anemia, unspecified: Secondary | ICD-10-CM | POA: Diagnosis not present

## 2015-07-01 DIAGNOSIS — Z79899 Other long term (current) drug therapy: Secondary | ICD-10-CM | POA: Diagnosis not present

## 2015-07-01 DIAGNOSIS — D631 Anemia in chronic kidney disease: Secondary | ICD-10-CM | POA: Diagnosis not present

## 2015-07-01 DIAGNOSIS — E440 Moderate protein-calorie malnutrition: Secondary | ICD-10-CM | POA: Diagnosis not present

## 2015-07-02 DIAGNOSIS — D509 Iron deficiency anemia, unspecified: Secondary | ICD-10-CM | POA: Diagnosis not present

## 2015-07-02 DIAGNOSIS — D631 Anemia in chronic kidney disease: Secondary | ICD-10-CM | POA: Diagnosis not present

## 2015-07-02 DIAGNOSIS — E440 Moderate protein-calorie malnutrition: Secondary | ICD-10-CM | POA: Diagnosis not present

## 2015-07-02 DIAGNOSIS — Z79899 Other long term (current) drug therapy: Secondary | ICD-10-CM | POA: Diagnosis not present

## 2015-07-02 DIAGNOSIS — N186 End stage renal disease: Secondary | ICD-10-CM | POA: Diagnosis not present

## 2015-07-02 DIAGNOSIS — Z5189 Encounter for other specified aftercare: Secondary | ICD-10-CM | POA: Diagnosis not present

## 2015-07-03 DIAGNOSIS — Z79899 Other long term (current) drug therapy: Secondary | ICD-10-CM | POA: Diagnosis not present

## 2015-07-03 DIAGNOSIS — D509 Iron deficiency anemia, unspecified: Secondary | ICD-10-CM | POA: Diagnosis not present

## 2015-07-03 DIAGNOSIS — N186 End stage renal disease: Secondary | ICD-10-CM | POA: Diagnosis not present

## 2015-07-03 DIAGNOSIS — D631 Anemia in chronic kidney disease: Secondary | ICD-10-CM | POA: Diagnosis not present

## 2015-07-03 DIAGNOSIS — Z5189 Encounter for other specified aftercare: Secondary | ICD-10-CM | POA: Diagnosis not present

## 2015-07-03 DIAGNOSIS — E440 Moderate protein-calorie malnutrition: Secondary | ICD-10-CM | POA: Diagnosis not present

## 2015-07-04 DIAGNOSIS — D631 Anemia in chronic kidney disease: Secondary | ICD-10-CM | POA: Diagnosis not present

## 2015-07-04 DIAGNOSIS — Z79899 Other long term (current) drug therapy: Secondary | ICD-10-CM | POA: Diagnosis not present

## 2015-07-04 DIAGNOSIS — Z5189 Encounter for other specified aftercare: Secondary | ICD-10-CM | POA: Diagnosis not present

## 2015-07-04 DIAGNOSIS — D509 Iron deficiency anemia, unspecified: Secondary | ICD-10-CM | POA: Diagnosis not present

## 2015-07-04 DIAGNOSIS — N186 End stage renal disease: Secondary | ICD-10-CM | POA: Diagnosis not present

## 2015-07-04 DIAGNOSIS — E440 Moderate protein-calorie malnutrition: Secondary | ICD-10-CM | POA: Diagnosis not present

## 2015-07-05 DIAGNOSIS — Z5189 Encounter for other specified aftercare: Secondary | ICD-10-CM | POA: Diagnosis not present

## 2015-07-05 DIAGNOSIS — N186 End stage renal disease: Secondary | ICD-10-CM | POA: Diagnosis not present

## 2015-07-05 DIAGNOSIS — E440 Moderate protein-calorie malnutrition: Secondary | ICD-10-CM | POA: Diagnosis not present

## 2015-07-05 DIAGNOSIS — D509 Iron deficiency anemia, unspecified: Secondary | ICD-10-CM | POA: Diagnosis not present

## 2015-07-05 DIAGNOSIS — D631 Anemia in chronic kidney disease: Secondary | ICD-10-CM | POA: Diagnosis not present

## 2015-07-05 DIAGNOSIS — Z79899 Other long term (current) drug therapy: Secondary | ICD-10-CM | POA: Diagnosis not present

## 2015-07-06 DIAGNOSIS — Z79899 Other long term (current) drug therapy: Secondary | ICD-10-CM | POA: Diagnosis not present

## 2015-07-06 DIAGNOSIS — E440 Moderate protein-calorie malnutrition: Secondary | ICD-10-CM | POA: Diagnosis not present

## 2015-07-06 DIAGNOSIS — D631 Anemia in chronic kidney disease: Secondary | ICD-10-CM | POA: Diagnosis not present

## 2015-07-06 DIAGNOSIS — N186 End stage renal disease: Secondary | ICD-10-CM | POA: Diagnosis not present

## 2015-07-06 DIAGNOSIS — D509 Iron deficiency anemia, unspecified: Secondary | ICD-10-CM | POA: Diagnosis not present

## 2015-07-06 DIAGNOSIS — Z5189 Encounter for other specified aftercare: Secondary | ICD-10-CM | POA: Diagnosis not present

## 2015-07-07 DIAGNOSIS — Z79899 Other long term (current) drug therapy: Secondary | ICD-10-CM | POA: Diagnosis not present

## 2015-07-07 DIAGNOSIS — Z5189 Encounter for other specified aftercare: Secondary | ICD-10-CM | POA: Diagnosis not present

## 2015-07-07 DIAGNOSIS — D631 Anemia in chronic kidney disease: Secondary | ICD-10-CM | POA: Diagnosis not present

## 2015-07-07 DIAGNOSIS — N186 End stage renal disease: Secondary | ICD-10-CM | POA: Diagnosis not present

## 2015-07-07 DIAGNOSIS — D509 Iron deficiency anemia, unspecified: Secondary | ICD-10-CM | POA: Diagnosis not present

## 2015-07-07 DIAGNOSIS — E440 Moderate protein-calorie malnutrition: Secondary | ICD-10-CM | POA: Diagnosis not present

## 2015-07-08 DIAGNOSIS — N186 End stage renal disease: Secondary | ICD-10-CM | POA: Diagnosis not present

## 2015-07-08 DIAGNOSIS — Z5189 Encounter for other specified aftercare: Secondary | ICD-10-CM | POA: Diagnosis not present

## 2015-07-08 DIAGNOSIS — D509 Iron deficiency anemia, unspecified: Secondary | ICD-10-CM | POA: Diagnosis not present

## 2015-07-08 DIAGNOSIS — D631 Anemia in chronic kidney disease: Secondary | ICD-10-CM | POA: Diagnosis not present

## 2015-07-08 DIAGNOSIS — Z79899 Other long term (current) drug therapy: Secondary | ICD-10-CM | POA: Diagnosis not present

## 2015-07-08 DIAGNOSIS — E440 Moderate protein-calorie malnutrition: Secondary | ICD-10-CM | POA: Diagnosis not present

## 2015-07-09 DIAGNOSIS — Z5189 Encounter for other specified aftercare: Secondary | ICD-10-CM | POA: Diagnosis not present

## 2015-07-09 DIAGNOSIS — D509 Iron deficiency anemia, unspecified: Secondary | ICD-10-CM | POA: Diagnosis not present

## 2015-07-09 DIAGNOSIS — E440 Moderate protein-calorie malnutrition: Secondary | ICD-10-CM | POA: Diagnosis not present

## 2015-07-09 DIAGNOSIS — D631 Anemia in chronic kidney disease: Secondary | ICD-10-CM | POA: Diagnosis not present

## 2015-07-09 DIAGNOSIS — Z79899 Other long term (current) drug therapy: Secondary | ICD-10-CM | POA: Diagnosis not present

## 2015-07-09 DIAGNOSIS — N186 End stage renal disease: Secondary | ICD-10-CM | POA: Diagnosis not present

## 2015-07-10 DIAGNOSIS — Z79899 Other long term (current) drug therapy: Secondary | ICD-10-CM | POA: Diagnosis not present

## 2015-07-10 DIAGNOSIS — D509 Iron deficiency anemia, unspecified: Secondary | ICD-10-CM | POA: Diagnosis not present

## 2015-07-10 DIAGNOSIS — Z5189 Encounter for other specified aftercare: Secondary | ICD-10-CM | POA: Diagnosis not present

## 2015-07-10 DIAGNOSIS — E440 Moderate protein-calorie malnutrition: Secondary | ICD-10-CM | POA: Diagnosis not present

## 2015-07-10 DIAGNOSIS — N186 End stage renal disease: Secondary | ICD-10-CM | POA: Diagnosis not present

## 2015-07-10 DIAGNOSIS — D631 Anemia in chronic kidney disease: Secondary | ICD-10-CM | POA: Diagnosis not present

## 2015-07-11 DIAGNOSIS — D631 Anemia in chronic kidney disease: Secondary | ICD-10-CM | POA: Diagnosis not present

## 2015-07-11 DIAGNOSIS — N186 End stage renal disease: Secondary | ICD-10-CM | POA: Diagnosis not present

## 2015-07-11 DIAGNOSIS — Z79899 Other long term (current) drug therapy: Secondary | ICD-10-CM | POA: Diagnosis not present

## 2015-07-11 DIAGNOSIS — D509 Iron deficiency anemia, unspecified: Secondary | ICD-10-CM | POA: Diagnosis not present

## 2015-07-11 DIAGNOSIS — E440 Moderate protein-calorie malnutrition: Secondary | ICD-10-CM | POA: Diagnosis not present

## 2015-07-11 DIAGNOSIS — Z5189 Encounter for other specified aftercare: Secondary | ICD-10-CM | POA: Diagnosis not present

## 2015-07-12 DIAGNOSIS — D509 Iron deficiency anemia, unspecified: Secondary | ICD-10-CM | POA: Diagnosis not present

## 2015-07-12 DIAGNOSIS — D631 Anemia in chronic kidney disease: Secondary | ICD-10-CM | POA: Diagnosis not present

## 2015-07-12 DIAGNOSIS — Z79899 Other long term (current) drug therapy: Secondary | ICD-10-CM | POA: Diagnosis not present

## 2015-07-12 DIAGNOSIS — E440 Moderate protein-calorie malnutrition: Secondary | ICD-10-CM | POA: Diagnosis not present

## 2015-07-12 DIAGNOSIS — Z5189 Encounter for other specified aftercare: Secondary | ICD-10-CM | POA: Diagnosis not present

## 2015-07-12 DIAGNOSIS — N186 End stage renal disease: Secondary | ICD-10-CM | POA: Diagnosis not present

## 2015-07-13 DIAGNOSIS — Z79899 Other long term (current) drug therapy: Secondary | ICD-10-CM | POA: Diagnosis not present

## 2015-07-13 DIAGNOSIS — Z5189 Encounter for other specified aftercare: Secondary | ICD-10-CM | POA: Diagnosis not present

## 2015-07-13 DIAGNOSIS — D631 Anemia in chronic kidney disease: Secondary | ICD-10-CM | POA: Diagnosis not present

## 2015-07-13 DIAGNOSIS — D509 Iron deficiency anemia, unspecified: Secondary | ICD-10-CM | POA: Diagnosis not present

## 2015-07-13 DIAGNOSIS — E440 Moderate protein-calorie malnutrition: Secondary | ICD-10-CM | POA: Diagnosis not present

## 2015-07-13 DIAGNOSIS — N186 End stage renal disease: Secondary | ICD-10-CM | POA: Diagnosis not present

## 2015-07-14 DIAGNOSIS — D631 Anemia in chronic kidney disease: Secondary | ICD-10-CM | POA: Diagnosis not present

## 2015-07-14 DIAGNOSIS — Z79899 Other long term (current) drug therapy: Secondary | ICD-10-CM | POA: Diagnosis not present

## 2015-07-14 DIAGNOSIS — N186 End stage renal disease: Secondary | ICD-10-CM | POA: Diagnosis not present

## 2015-07-14 DIAGNOSIS — Z5189 Encounter for other specified aftercare: Secondary | ICD-10-CM | POA: Diagnosis not present

## 2015-07-14 DIAGNOSIS — D509 Iron deficiency anemia, unspecified: Secondary | ICD-10-CM | POA: Diagnosis not present

## 2015-07-14 DIAGNOSIS — E440 Moderate protein-calorie malnutrition: Secondary | ICD-10-CM | POA: Diagnosis not present

## 2015-07-15 DIAGNOSIS — E440 Moderate protein-calorie malnutrition: Secondary | ICD-10-CM | POA: Diagnosis not present

## 2015-07-15 DIAGNOSIS — Z79899 Other long term (current) drug therapy: Secondary | ICD-10-CM | POA: Diagnosis not present

## 2015-07-15 DIAGNOSIS — Z5189 Encounter for other specified aftercare: Secondary | ICD-10-CM | POA: Diagnosis not present

## 2015-07-15 DIAGNOSIS — D509 Iron deficiency anemia, unspecified: Secondary | ICD-10-CM | POA: Diagnosis not present

## 2015-07-15 DIAGNOSIS — N186 End stage renal disease: Secondary | ICD-10-CM | POA: Diagnosis not present

## 2015-07-15 DIAGNOSIS — D631 Anemia in chronic kidney disease: Secondary | ICD-10-CM | POA: Diagnosis not present

## 2015-07-16 DIAGNOSIS — D509 Iron deficiency anemia, unspecified: Secondary | ICD-10-CM | POA: Diagnosis not present

## 2015-07-16 DIAGNOSIS — E440 Moderate protein-calorie malnutrition: Secondary | ICD-10-CM | POA: Diagnosis not present

## 2015-07-16 DIAGNOSIS — Z79899 Other long term (current) drug therapy: Secondary | ICD-10-CM | POA: Diagnosis not present

## 2015-07-16 DIAGNOSIS — Z5189 Encounter for other specified aftercare: Secondary | ICD-10-CM | POA: Diagnosis not present

## 2015-07-16 DIAGNOSIS — D631 Anemia in chronic kidney disease: Secondary | ICD-10-CM | POA: Diagnosis not present

## 2015-07-16 DIAGNOSIS — N186 End stage renal disease: Secondary | ICD-10-CM | POA: Diagnosis not present

## 2015-07-17 DIAGNOSIS — D631 Anemia in chronic kidney disease: Secondary | ICD-10-CM | POA: Diagnosis not present

## 2015-07-17 DIAGNOSIS — N186 End stage renal disease: Secondary | ICD-10-CM | POA: Diagnosis not present

## 2015-07-17 DIAGNOSIS — Z79899 Other long term (current) drug therapy: Secondary | ICD-10-CM | POA: Diagnosis not present

## 2015-07-17 DIAGNOSIS — E440 Moderate protein-calorie malnutrition: Secondary | ICD-10-CM | POA: Diagnosis not present

## 2015-07-17 DIAGNOSIS — D509 Iron deficiency anemia, unspecified: Secondary | ICD-10-CM | POA: Diagnosis not present

## 2015-07-17 DIAGNOSIS — Z5189 Encounter for other specified aftercare: Secondary | ICD-10-CM | POA: Diagnosis not present

## 2015-07-18 DIAGNOSIS — N186 End stage renal disease: Secondary | ICD-10-CM | POA: Diagnosis not present

## 2015-07-18 DIAGNOSIS — D631 Anemia in chronic kidney disease: Secondary | ICD-10-CM | POA: Diagnosis not present

## 2015-07-18 DIAGNOSIS — E440 Moderate protein-calorie malnutrition: Secondary | ICD-10-CM | POA: Diagnosis not present

## 2015-07-18 DIAGNOSIS — Z79899 Other long term (current) drug therapy: Secondary | ICD-10-CM | POA: Diagnosis not present

## 2015-07-18 DIAGNOSIS — Z5189 Encounter for other specified aftercare: Secondary | ICD-10-CM | POA: Diagnosis not present

## 2015-07-18 DIAGNOSIS — D509 Iron deficiency anemia, unspecified: Secondary | ICD-10-CM | POA: Diagnosis not present

## 2015-07-19 DIAGNOSIS — D509 Iron deficiency anemia, unspecified: Secondary | ICD-10-CM | POA: Diagnosis not present

## 2015-07-19 DIAGNOSIS — N186 End stage renal disease: Secondary | ICD-10-CM | POA: Diagnosis not present

## 2015-07-19 DIAGNOSIS — E440 Moderate protein-calorie malnutrition: Secondary | ICD-10-CM | POA: Diagnosis not present

## 2015-07-19 DIAGNOSIS — Z79899 Other long term (current) drug therapy: Secondary | ICD-10-CM | POA: Diagnosis not present

## 2015-07-19 DIAGNOSIS — Z5189 Encounter for other specified aftercare: Secondary | ICD-10-CM | POA: Diagnosis not present

## 2015-07-19 DIAGNOSIS — D631 Anemia in chronic kidney disease: Secondary | ICD-10-CM | POA: Diagnosis not present

## 2015-07-20 DIAGNOSIS — Z79899 Other long term (current) drug therapy: Secondary | ICD-10-CM | POA: Diagnosis not present

## 2015-07-20 DIAGNOSIS — D509 Iron deficiency anemia, unspecified: Secondary | ICD-10-CM | POA: Diagnosis not present

## 2015-07-20 DIAGNOSIS — Z5189 Encounter for other specified aftercare: Secondary | ICD-10-CM | POA: Diagnosis not present

## 2015-07-20 DIAGNOSIS — N186 End stage renal disease: Secondary | ICD-10-CM | POA: Diagnosis not present

## 2015-07-20 DIAGNOSIS — E440 Moderate protein-calorie malnutrition: Secondary | ICD-10-CM | POA: Diagnosis not present

## 2015-07-20 DIAGNOSIS — D631 Anemia in chronic kidney disease: Secondary | ICD-10-CM | POA: Diagnosis not present

## 2015-07-21 DIAGNOSIS — Z5189 Encounter for other specified aftercare: Secondary | ICD-10-CM | POA: Diagnosis not present

## 2015-07-21 DIAGNOSIS — D631 Anemia in chronic kidney disease: Secondary | ICD-10-CM | POA: Diagnosis not present

## 2015-07-21 DIAGNOSIS — Z79899 Other long term (current) drug therapy: Secondary | ICD-10-CM | POA: Diagnosis not present

## 2015-07-21 DIAGNOSIS — N186 End stage renal disease: Secondary | ICD-10-CM | POA: Diagnosis not present

## 2015-07-21 DIAGNOSIS — E440 Moderate protein-calorie malnutrition: Secondary | ICD-10-CM | POA: Diagnosis not present

## 2015-07-21 DIAGNOSIS — D509 Iron deficiency anemia, unspecified: Secondary | ICD-10-CM | POA: Diagnosis not present

## 2015-07-22 DIAGNOSIS — D631 Anemia in chronic kidney disease: Secondary | ICD-10-CM | POA: Diagnosis not present

## 2015-07-22 DIAGNOSIS — D509 Iron deficiency anemia, unspecified: Secondary | ICD-10-CM | POA: Diagnosis not present

## 2015-07-22 DIAGNOSIS — N186 End stage renal disease: Secondary | ICD-10-CM | POA: Diagnosis not present

## 2015-07-22 DIAGNOSIS — Z79899 Other long term (current) drug therapy: Secondary | ICD-10-CM | POA: Diagnosis not present

## 2015-07-22 DIAGNOSIS — E440 Moderate protein-calorie malnutrition: Secondary | ICD-10-CM | POA: Diagnosis not present

## 2015-07-22 DIAGNOSIS — Z5189 Encounter for other specified aftercare: Secondary | ICD-10-CM | POA: Diagnosis not present

## 2015-07-23 DIAGNOSIS — N186 End stage renal disease: Secondary | ICD-10-CM | POA: Diagnosis not present

## 2015-07-23 DIAGNOSIS — E440 Moderate protein-calorie malnutrition: Secondary | ICD-10-CM | POA: Diagnosis not present

## 2015-07-23 DIAGNOSIS — Z79899 Other long term (current) drug therapy: Secondary | ICD-10-CM | POA: Diagnosis not present

## 2015-07-23 DIAGNOSIS — D509 Iron deficiency anemia, unspecified: Secondary | ICD-10-CM | POA: Diagnosis not present

## 2015-07-23 DIAGNOSIS — D631 Anemia in chronic kidney disease: Secondary | ICD-10-CM | POA: Diagnosis not present

## 2015-07-23 DIAGNOSIS — Z5189 Encounter for other specified aftercare: Secondary | ICD-10-CM | POA: Diagnosis not present

## 2015-07-24 DIAGNOSIS — D509 Iron deficiency anemia, unspecified: Secondary | ICD-10-CM | POA: Diagnosis not present

## 2015-07-24 DIAGNOSIS — D631 Anemia in chronic kidney disease: Secondary | ICD-10-CM | POA: Diagnosis not present

## 2015-07-24 DIAGNOSIS — N186 End stage renal disease: Secondary | ICD-10-CM | POA: Diagnosis not present

## 2015-07-24 DIAGNOSIS — E440 Moderate protein-calorie malnutrition: Secondary | ICD-10-CM | POA: Diagnosis not present

## 2015-07-24 DIAGNOSIS — Z79899 Other long term (current) drug therapy: Secondary | ICD-10-CM | POA: Diagnosis not present

## 2015-07-24 DIAGNOSIS — Z5189 Encounter for other specified aftercare: Secondary | ICD-10-CM | POA: Diagnosis not present

## 2015-07-25 DIAGNOSIS — D509 Iron deficiency anemia, unspecified: Secondary | ICD-10-CM | POA: Diagnosis not present

## 2015-07-25 DIAGNOSIS — Z79899 Other long term (current) drug therapy: Secondary | ICD-10-CM | POA: Diagnosis not present

## 2015-07-25 DIAGNOSIS — N186 End stage renal disease: Secondary | ICD-10-CM | POA: Diagnosis not present

## 2015-07-25 DIAGNOSIS — Z5189 Encounter for other specified aftercare: Secondary | ICD-10-CM | POA: Diagnosis not present

## 2015-07-25 DIAGNOSIS — D631 Anemia in chronic kidney disease: Secondary | ICD-10-CM | POA: Diagnosis not present

## 2015-07-25 DIAGNOSIS — E440 Moderate protein-calorie malnutrition: Secondary | ICD-10-CM | POA: Diagnosis not present

## 2015-07-26 DIAGNOSIS — Z79899 Other long term (current) drug therapy: Secondary | ICD-10-CM | POA: Diagnosis not present

## 2015-07-26 DIAGNOSIS — D509 Iron deficiency anemia, unspecified: Secondary | ICD-10-CM | POA: Diagnosis not present

## 2015-07-26 DIAGNOSIS — Z5189 Encounter for other specified aftercare: Secondary | ICD-10-CM | POA: Diagnosis not present

## 2015-07-26 DIAGNOSIS — E440 Moderate protein-calorie malnutrition: Secondary | ICD-10-CM | POA: Diagnosis not present

## 2015-07-26 DIAGNOSIS — N186 End stage renal disease: Secondary | ICD-10-CM | POA: Diagnosis not present

## 2015-07-26 DIAGNOSIS — D631 Anemia in chronic kidney disease: Secondary | ICD-10-CM | POA: Diagnosis not present

## 2015-07-27 DIAGNOSIS — D509 Iron deficiency anemia, unspecified: Secondary | ICD-10-CM | POA: Diagnosis not present

## 2015-07-27 DIAGNOSIS — D631 Anemia in chronic kidney disease: Secondary | ICD-10-CM | POA: Diagnosis not present

## 2015-07-27 DIAGNOSIS — N186 End stage renal disease: Secondary | ICD-10-CM | POA: Diagnosis not present

## 2015-07-27 DIAGNOSIS — E440 Moderate protein-calorie malnutrition: Secondary | ICD-10-CM | POA: Diagnosis not present

## 2015-07-27 DIAGNOSIS — Z79899 Other long term (current) drug therapy: Secondary | ICD-10-CM | POA: Diagnosis not present

## 2015-07-27 DIAGNOSIS — Z5189 Encounter for other specified aftercare: Secondary | ICD-10-CM | POA: Diagnosis not present

## 2015-07-28 DIAGNOSIS — N186 End stage renal disease: Secondary | ICD-10-CM | POA: Diagnosis not present

## 2015-07-28 DIAGNOSIS — D509 Iron deficiency anemia, unspecified: Secondary | ICD-10-CM | POA: Diagnosis not present

## 2015-07-28 DIAGNOSIS — D631 Anemia in chronic kidney disease: Secondary | ICD-10-CM | POA: Diagnosis not present

## 2015-07-28 DIAGNOSIS — Z992 Dependence on renal dialysis: Secondary | ICD-10-CM | POA: Diagnosis not present

## 2015-07-28 DIAGNOSIS — Z5189 Encounter for other specified aftercare: Secondary | ICD-10-CM | POA: Diagnosis not present

## 2015-07-28 DIAGNOSIS — Z79899 Other long term (current) drug therapy: Secondary | ICD-10-CM | POA: Diagnosis not present

## 2015-07-28 DIAGNOSIS — E440 Moderate protein-calorie malnutrition: Secondary | ICD-10-CM | POA: Diagnosis not present

## 2015-07-28 DIAGNOSIS — I129 Hypertensive chronic kidney disease with stage 1 through stage 4 chronic kidney disease, or unspecified chronic kidney disease: Secondary | ICD-10-CM | POA: Diagnosis not present

## 2015-07-29 DIAGNOSIS — D509 Iron deficiency anemia, unspecified: Secondary | ICD-10-CM | POA: Diagnosis not present

## 2015-07-29 DIAGNOSIS — K769 Liver disease, unspecified: Secondary | ICD-10-CM | POA: Diagnosis not present

## 2015-07-29 DIAGNOSIS — N2581 Secondary hyperparathyroidism of renal origin: Secondary | ICD-10-CM | POA: Diagnosis not present

## 2015-07-29 DIAGNOSIS — Z79899 Other long term (current) drug therapy: Secondary | ICD-10-CM | POA: Diagnosis not present

## 2015-07-29 DIAGNOSIS — E440 Moderate protein-calorie malnutrition: Secondary | ICD-10-CM | POA: Diagnosis not present

## 2015-07-29 DIAGNOSIS — D631 Anemia in chronic kidney disease: Secondary | ICD-10-CM | POA: Diagnosis not present

## 2015-07-29 DIAGNOSIS — N186 End stage renal disease: Secondary | ICD-10-CM | POA: Diagnosis not present

## 2015-07-29 DIAGNOSIS — Z5189 Encounter for other specified aftercare: Secondary | ICD-10-CM | POA: Diagnosis not present

## 2015-07-30 DIAGNOSIS — D631 Anemia in chronic kidney disease: Secondary | ICD-10-CM | POA: Diagnosis not present

## 2015-07-30 DIAGNOSIS — N2581 Secondary hyperparathyroidism of renal origin: Secondary | ICD-10-CM | POA: Diagnosis not present

## 2015-07-30 DIAGNOSIS — Z5189 Encounter for other specified aftercare: Secondary | ICD-10-CM | POA: Diagnosis not present

## 2015-07-30 DIAGNOSIS — Z79899 Other long term (current) drug therapy: Secondary | ICD-10-CM | POA: Diagnosis not present

## 2015-07-30 DIAGNOSIS — D509 Iron deficiency anemia, unspecified: Secondary | ICD-10-CM | POA: Diagnosis not present

## 2015-07-30 DIAGNOSIS — N186 End stage renal disease: Secondary | ICD-10-CM | POA: Diagnosis not present

## 2015-07-31 DIAGNOSIS — N186 End stage renal disease: Secondary | ICD-10-CM | POA: Diagnosis not present

## 2015-07-31 DIAGNOSIS — Z79899 Other long term (current) drug therapy: Secondary | ICD-10-CM | POA: Diagnosis not present

## 2015-07-31 DIAGNOSIS — D509 Iron deficiency anemia, unspecified: Secondary | ICD-10-CM | POA: Diagnosis not present

## 2015-07-31 DIAGNOSIS — Z5189 Encounter for other specified aftercare: Secondary | ICD-10-CM | POA: Diagnosis not present

## 2015-07-31 DIAGNOSIS — D631 Anemia in chronic kidney disease: Secondary | ICD-10-CM | POA: Diagnosis not present

## 2015-07-31 DIAGNOSIS — N2581 Secondary hyperparathyroidism of renal origin: Secondary | ICD-10-CM | POA: Diagnosis not present

## 2015-08-01 DIAGNOSIS — N2581 Secondary hyperparathyroidism of renal origin: Secondary | ICD-10-CM | POA: Diagnosis not present

## 2015-08-01 DIAGNOSIS — D631 Anemia in chronic kidney disease: Secondary | ICD-10-CM | POA: Diagnosis not present

## 2015-08-01 DIAGNOSIS — N186 End stage renal disease: Secondary | ICD-10-CM | POA: Diagnosis not present

## 2015-08-01 DIAGNOSIS — Z79899 Other long term (current) drug therapy: Secondary | ICD-10-CM | POA: Diagnosis not present

## 2015-08-01 DIAGNOSIS — D509 Iron deficiency anemia, unspecified: Secondary | ICD-10-CM | POA: Diagnosis not present

## 2015-08-01 DIAGNOSIS — Z5189 Encounter for other specified aftercare: Secondary | ICD-10-CM | POA: Diagnosis not present

## 2015-08-02 DIAGNOSIS — N2581 Secondary hyperparathyroidism of renal origin: Secondary | ICD-10-CM | POA: Diagnosis not present

## 2015-08-02 DIAGNOSIS — D631 Anemia in chronic kidney disease: Secondary | ICD-10-CM | POA: Diagnosis not present

## 2015-08-02 DIAGNOSIS — Z5189 Encounter for other specified aftercare: Secondary | ICD-10-CM | POA: Diagnosis not present

## 2015-08-02 DIAGNOSIS — Z79899 Other long term (current) drug therapy: Secondary | ICD-10-CM | POA: Diagnosis not present

## 2015-08-02 DIAGNOSIS — D509 Iron deficiency anemia, unspecified: Secondary | ICD-10-CM | POA: Diagnosis not present

## 2015-08-02 DIAGNOSIS — N186 End stage renal disease: Secondary | ICD-10-CM | POA: Diagnosis not present

## 2015-08-03 DIAGNOSIS — N186 End stage renal disease: Secondary | ICD-10-CM | POA: Diagnosis not present

## 2015-08-03 DIAGNOSIS — D631 Anemia in chronic kidney disease: Secondary | ICD-10-CM | POA: Diagnosis not present

## 2015-08-03 DIAGNOSIS — Z5189 Encounter for other specified aftercare: Secondary | ICD-10-CM | POA: Diagnosis not present

## 2015-08-03 DIAGNOSIS — D509 Iron deficiency anemia, unspecified: Secondary | ICD-10-CM | POA: Diagnosis not present

## 2015-08-03 DIAGNOSIS — Z79899 Other long term (current) drug therapy: Secondary | ICD-10-CM | POA: Diagnosis not present

## 2015-08-03 DIAGNOSIS — N2581 Secondary hyperparathyroidism of renal origin: Secondary | ICD-10-CM | POA: Diagnosis not present

## 2015-08-04 DIAGNOSIS — N2581 Secondary hyperparathyroidism of renal origin: Secondary | ICD-10-CM | POA: Diagnosis not present

## 2015-08-04 DIAGNOSIS — N186 End stage renal disease: Secondary | ICD-10-CM | POA: Diagnosis not present

## 2015-08-04 DIAGNOSIS — Z5189 Encounter for other specified aftercare: Secondary | ICD-10-CM | POA: Diagnosis not present

## 2015-08-04 DIAGNOSIS — D509 Iron deficiency anemia, unspecified: Secondary | ICD-10-CM | POA: Diagnosis not present

## 2015-08-04 DIAGNOSIS — D631 Anemia in chronic kidney disease: Secondary | ICD-10-CM | POA: Diagnosis not present

## 2015-08-04 DIAGNOSIS — Z79899 Other long term (current) drug therapy: Secondary | ICD-10-CM | POA: Diagnosis not present

## 2015-08-05 DIAGNOSIS — Z5189 Encounter for other specified aftercare: Secondary | ICD-10-CM | POA: Diagnosis not present

## 2015-08-05 DIAGNOSIS — Z79899 Other long term (current) drug therapy: Secondary | ICD-10-CM | POA: Diagnosis not present

## 2015-08-05 DIAGNOSIS — D631 Anemia in chronic kidney disease: Secondary | ICD-10-CM | POA: Diagnosis not present

## 2015-08-05 DIAGNOSIS — N2581 Secondary hyperparathyroidism of renal origin: Secondary | ICD-10-CM | POA: Diagnosis not present

## 2015-08-05 DIAGNOSIS — D509 Iron deficiency anemia, unspecified: Secondary | ICD-10-CM | POA: Diagnosis not present

## 2015-08-05 DIAGNOSIS — N186 End stage renal disease: Secondary | ICD-10-CM | POA: Diagnosis not present

## 2015-08-06 DIAGNOSIS — N186 End stage renal disease: Secondary | ICD-10-CM | POA: Diagnosis not present

## 2015-08-06 DIAGNOSIS — D631 Anemia in chronic kidney disease: Secondary | ICD-10-CM | POA: Diagnosis not present

## 2015-08-06 DIAGNOSIS — N2581 Secondary hyperparathyroidism of renal origin: Secondary | ICD-10-CM | POA: Diagnosis not present

## 2015-08-06 DIAGNOSIS — D509 Iron deficiency anemia, unspecified: Secondary | ICD-10-CM | POA: Diagnosis not present

## 2015-08-06 DIAGNOSIS — Z5189 Encounter for other specified aftercare: Secondary | ICD-10-CM | POA: Diagnosis not present

## 2015-08-06 DIAGNOSIS — Z79899 Other long term (current) drug therapy: Secondary | ICD-10-CM | POA: Diagnosis not present

## 2015-08-07 DIAGNOSIS — N186 End stage renal disease: Secondary | ICD-10-CM | POA: Diagnosis not present

## 2015-08-07 DIAGNOSIS — D631 Anemia in chronic kidney disease: Secondary | ICD-10-CM | POA: Diagnosis not present

## 2015-08-07 DIAGNOSIS — D509 Iron deficiency anemia, unspecified: Secondary | ICD-10-CM | POA: Diagnosis not present

## 2015-08-07 DIAGNOSIS — Z5189 Encounter for other specified aftercare: Secondary | ICD-10-CM | POA: Diagnosis not present

## 2015-08-07 DIAGNOSIS — Z79899 Other long term (current) drug therapy: Secondary | ICD-10-CM | POA: Diagnosis not present

## 2015-08-07 DIAGNOSIS — N2581 Secondary hyperparathyroidism of renal origin: Secondary | ICD-10-CM | POA: Diagnosis not present

## 2015-08-08 DIAGNOSIS — Z79899 Other long term (current) drug therapy: Secondary | ICD-10-CM | POA: Diagnosis not present

## 2015-08-08 DIAGNOSIS — N2581 Secondary hyperparathyroidism of renal origin: Secondary | ICD-10-CM | POA: Diagnosis not present

## 2015-08-08 DIAGNOSIS — D631 Anemia in chronic kidney disease: Secondary | ICD-10-CM | POA: Diagnosis not present

## 2015-08-08 DIAGNOSIS — D509 Iron deficiency anemia, unspecified: Secondary | ICD-10-CM | POA: Diagnosis not present

## 2015-08-08 DIAGNOSIS — N186 End stage renal disease: Secondary | ICD-10-CM | POA: Diagnosis not present

## 2015-08-08 DIAGNOSIS — Z5189 Encounter for other specified aftercare: Secondary | ICD-10-CM | POA: Diagnosis not present

## 2015-08-09 DIAGNOSIS — D631 Anemia in chronic kidney disease: Secondary | ICD-10-CM | POA: Diagnosis not present

## 2015-08-09 DIAGNOSIS — N186 End stage renal disease: Secondary | ICD-10-CM | POA: Diagnosis not present

## 2015-08-09 DIAGNOSIS — D509 Iron deficiency anemia, unspecified: Secondary | ICD-10-CM | POA: Diagnosis not present

## 2015-08-09 DIAGNOSIS — R8299 Other abnormal findings in urine: Secondary | ICD-10-CM | POA: Diagnosis not present

## 2015-08-09 DIAGNOSIS — Z79899 Other long term (current) drug therapy: Secondary | ICD-10-CM | POA: Diagnosis not present

## 2015-08-09 DIAGNOSIS — Z5189 Encounter for other specified aftercare: Secondary | ICD-10-CM | POA: Diagnosis not present

## 2015-08-09 DIAGNOSIS — N2581 Secondary hyperparathyroidism of renal origin: Secondary | ICD-10-CM | POA: Diagnosis not present

## 2015-08-10 ENCOUNTER — Ambulatory Visit
Admission: RE | Admit: 2015-08-10 | Discharge: 2015-08-10 | Disposition: A | Discharge status: Home / Self Care | Payer: Medicare Other | Source: Ambulatory Visit | Attending: Nephrology | Admitting: Nephrology

## 2015-08-10 ENCOUNTER — Other Ambulatory Visit: Payer: Self-pay | Admitting: Nephrology

## 2015-08-10 DIAGNOSIS — D509 Iron deficiency anemia, unspecified: Secondary | ICD-10-CM | POA: Diagnosis not present

## 2015-08-10 DIAGNOSIS — N186 End stage renal disease: Secondary | ICD-10-CM | POA: Diagnosis not present

## 2015-08-10 DIAGNOSIS — D631 Anemia in chronic kidney disease: Secondary | ICD-10-CM | POA: Diagnosis not present

## 2015-08-10 DIAGNOSIS — R1011 Right upper quadrant pain: Secondary | ICD-10-CM

## 2015-08-10 DIAGNOSIS — N2581 Secondary hyperparathyroidism of renal origin: Secondary | ICD-10-CM | POA: Diagnosis not present

## 2015-08-10 DIAGNOSIS — R109 Unspecified abdominal pain: Secondary | ICD-10-CM | POA: Diagnosis not present

## 2015-08-10 DIAGNOSIS — Z79899 Other long term (current) drug therapy: Secondary | ICD-10-CM | POA: Diagnosis not present

## 2015-08-10 DIAGNOSIS — Z5189 Encounter for other specified aftercare: Secondary | ICD-10-CM | POA: Diagnosis not present

## 2015-08-11 DIAGNOSIS — N186 End stage renal disease: Secondary | ICD-10-CM | POA: Diagnosis not present

## 2015-08-11 DIAGNOSIS — Z79899 Other long term (current) drug therapy: Secondary | ICD-10-CM | POA: Diagnosis not present

## 2015-08-11 DIAGNOSIS — D509 Iron deficiency anemia, unspecified: Secondary | ICD-10-CM | POA: Diagnosis not present

## 2015-08-11 DIAGNOSIS — D631 Anemia in chronic kidney disease: Secondary | ICD-10-CM | POA: Diagnosis not present

## 2015-08-11 DIAGNOSIS — Z5189 Encounter for other specified aftercare: Secondary | ICD-10-CM | POA: Diagnosis not present

## 2015-08-11 DIAGNOSIS — N2581 Secondary hyperparathyroidism of renal origin: Secondary | ICD-10-CM | POA: Diagnosis not present

## 2015-08-12 DIAGNOSIS — Z5189 Encounter for other specified aftercare: Secondary | ICD-10-CM | POA: Diagnosis not present

## 2015-08-12 DIAGNOSIS — D631 Anemia in chronic kidney disease: Secondary | ICD-10-CM | POA: Diagnosis not present

## 2015-08-12 DIAGNOSIS — Z79899 Other long term (current) drug therapy: Secondary | ICD-10-CM | POA: Diagnosis not present

## 2015-08-12 DIAGNOSIS — N2581 Secondary hyperparathyroidism of renal origin: Secondary | ICD-10-CM | POA: Diagnosis not present

## 2015-08-12 DIAGNOSIS — D509 Iron deficiency anemia, unspecified: Secondary | ICD-10-CM | POA: Diagnosis not present

## 2015-08-12 DIAGNOSIS — N186 End stage renal disease: Secondary | ICD-10-CM | POA: Diagnosis not present

## 2015-08-13 DIAGNOSIS — N2581 Secondary hyperparathyroidism of renal origin: Secondary | ICD-10-CM | POA: Diagnosis not present

## 2015-08-13 DIAGNOSIS — D509 Iron deficiency anemia, unspecified: Secondary | ICD-10-CM | POA: Diagnosis not present

## 2015-08-13 DIAGNOSIS — Z79899 Other long term (current) drug therapy: Secondary | ICD-10-CM | POA: Diagnosis not present

## 2015-08-13 DIAGNOSIS — Z5189 Encounter for other specified aftercare: Secondary | ICD-10-CM | POA: Diagnosis not present

## 2015-08-13 DIAGNOSIS — D631 Anemia in chronic kidney disease: Secondary | ICD-10-CM | POA: Diagnosis not present

## 2015-08-13 DIAGNOSIS — N186 End stage renal disease: Secondary | ICD-10-CM | POA: Diagnosis not present

## 2015-08-14 DIAGNOSIS — N2581 Secondary hyperparathyroidism of renal origin: Secondary | ICD-10-CM | POA: Diagnosis not present

## 2015-08-14 DIAGNOSIS — Z79899 Other long term (current) drug therapy: Secondary | ICD-10-CM | POA: Diagnosis not present

## 2015-08-14 DIAGNOSIS — D509 Iron deficiency anemia, unspecified: Secondary | ICD-10-CM | POA: Diagnosis not present

## 2015-08-14 DIAGNOSIS — N186 End stage renal disease: Secondary | ICD-10-CM | POA: Diagnosis not present

## 2015-08-14 DIAGNOSIS — D631 Anemia in chronic kidney disease: Secondary | ICD-10-CM | POA: Diagnosis not present

## 2015-08-14 DIAGNOSIS — Z5189 Encounter for other specified aftercare: Secondary | ICD-10-CM | POA: Diagnosis not present

## 2015-08-15 DIAGNOSIS — D631 Anemia in chronic kidney disease: Secondary | ICD-10-CM | POA: Diagnosis not present

## 2015-08-15 DIAGNOSIS — N186 End stage renal disease: Secondary | ICD-10-CM | POA: Diagnosis not present

## 2015-08-15 DIAGNOSIS — Z5189 Encounter for other specified aftercare: Secondary | ICD-10-CM | POA: Diagnosis not present

## 2015-08-15 DIAGNOSIS — D509 Iron deficiency anemia, unspecified: Secondary | ICD-10-CM | POA: Diagnosis not present

## 2015-08-15 DIAGNOSIS — Z79899 Other long term (current) drug therapy: Secondary | ICD-10-CM | POA: Diagnosis not present

## 2015-08-15 DIAGNOSIS — N2581 Secondary hyperparathyroidism of renal origin: Secondary | ICD-10-CM | POA: Diagnosis not present

## 2015-08-16 DIAGNOSIS — Z5189 Encounter for other specified aftercare: Secondary | ICD-10-CM | POA: Diagnosis not present

## 2015-08-16 DIAGNOSIS — Z79899 Other long term (current) drug therapy: Secondary | ICD-10-CM | POA: Diagnosis not present

## 2015-08-16 DIAGNOSIS — N186 End stage renal disease: Secondary | ICD-10-CM | POA: Diagnosis not present

## 2015-08-16 DIAGNOSIS — D631 Anemia in chronic kidney disease: Secondary | ICD-10-CM | POA: Diagnosis not present

## 2015-08-16 DIAGNOSIS — D509 Iron deficiency anemia, unspecified: Secondary | ICD-10-CM | POA: Diagnosis not present

## 2015-08-16 DIAGNOSIS — N2581 Secondary hyperparathyroidism of renal origin: Secondary | ICD-10-CM | POA: Diagnosis not present

## 2015-08-17 DIAGNOSIS — N186 End stage renal disease: Secondary | ICD-10-CM | POA: Diagnosis not present

## 2015-08-17 DIAGNOSIS — D509 Iron deficiency anemia, unspecified: Secondary | ICD-10-CM | POA: Diagnosis not present

## 2015-08-17 DIAGNOSIS — D631 Anemia in chronic kidney disease: Secondary | ICD-10-CM | POA: Diagnosis not present

## 2015-08-17 DIAGNOSIS — Z5189 Encounter for other specified aftercare: Secondary | ICD-10-CM | POA: Diagnosis not present

## 2015-08-17 DIAGNOSIS — Z79899 Other long term (current) drug therapy: Secondary | ICD-10-CM | POA: Diagnosis not present

## 2015-08-17 DIAGNOSIS — N2581 Secondary hyperparathyroidism of renal origin: Secondary | ICD-10-CM | POA: Diagnosis not present

## 2015-08-18 DIAGNOSIS — Z79899 Other long term (current) drug therapy: Secondary | ICD-10-CM | POA: Diagnosis not present

## 2015-08-18 DIAGNOSIS — N186 End stage renal disease: Secondary | ICD-10-CM | POA: Diagnosis not present

## 2015-08-18 DIAGNOSIS — Z5189 Encounter for other specified aftercare: Secondary | ICD-10-CM | POA: Diagnosis not present

## 2015-08-18 DIAGNOSIS — D509 Iron deficiency anemia, unspecified: Secondary | ICD-10-CM | POA: Diagnosis not present

## 2015-08-18 DIAGNOSIS — N2581 Secondary hyperparathyroidism of renal origin: Secondary | ICD-10-CM | POA: Diagnosis not present

## 2015-08-18 DIAGNOSIS — D631 Anemia in chronic kidney disease: Secondary | ICD-10-CM | POA: Diagnosis not present

## 2015-08-19 DIAGNOSIS — N2581 Secondary hyperparathyroidism of renal origin: Secondary | ICD-10-CM | POA: Diagnosis not present

## 2015-08-19 DIAGNOSIS — D509 Iron deficiency anemia, unspecified: Secondary | ICD-10-CM | POA: Diagnosis not present

## 2015-08-19 DIAGNOSIS — Z5189 Encounter for other specified aftercare: Secondary | ICD-10-CM | POA: Diagnosis not present

## 2015-08-19 DIAGNOSIS — Z79899 Other long term (current) drug therapy: Secondary | ICD-10-CM | POA: Diagnosis not present

## 2015-08-19 DIAGNOSIS — N186 End stage renal disease: Secondary | ICD-10-CM | POA: Diagnosis not present

## 2015-08-19 DIAGNOSIS — D631 Anemia in chronic kidney disease: Secondary | ICD-10-CM | POA: Diagnosis not present

## 2015-08-20 DIAGNOSIS — D631 Anemia in chronic kidney disease: Secondary | ICD-10-CM | POA: Diagnosis not present

## 2015-08-20 DIAGNOSIS — Z5189 Encounter for other specified aftercare: Secondary | ICD-10-CM | POA: Diagnosis not present

## 2015-08-20 DIAGNOSIS — N186 End stage renal disease: Secondary | ICD-10-CM | POA: Diagnosis not present

## 2015-08-20 DIAGNOSIS — Z79899 Other long term (current) drug therapy: Secondary | ICD-10-CM | POA: Diagnosis not present

## 2015-08-20 DIAGNOSIS — N2581 Secondary hyperparathyroidism of renal origin: Secondary | ICD-10-CM | POA: Diagnosis not present

## 2015-08-20 DIAGNOSIS — D509 Iron deficiency anemia, unspecified: Secondary | ICD-10-CM | POA: Diagnosis not present

## 2015-08-21 DIAGNOSIS — Z5189 Encounter for other specified aftercare: Secondary | ICD-10-CM | POA: Diagnosis not present

## 2015-08-21 DIAGNOSIS — Z79899 Other long term (current) drug therapy: Secondary | ICD-10-CM | POA: Diagnosis not present

## 2015-08-21 DIAGNOSIS — N2581 Secondary hyperparathyroidism of renal origin: Secondary | ICD-10-CM | POA: Diagnosis not present

## 2015-08-21 DIAGNOSIS — D509 Iron deficiency anemia, unspecified: Secondary | ICD-10-CM | POA: Diagnosis not present

## 2015-08-21 DIAGNOSIS — D631 Anemia in chronic kidney disease: Secondary | ICD-10-CM | POA: Diagnosis not present

## 2015-08-21 DIAGNOSIS — N186 End stage renal disease: Secondary | ICD-10-CM | POA: Diagnosis not present

## 2015-08-22 DIAGNOSIS — D631 Anemia in chronic kidney disease: Secondary | ICD-10-CM | POA: Diagnosis not present

## 2015-08-22 DIAGNOSIS — N186 End stage renal disease: Secondary | ICD-10-CM | POA: Diagnosis not present

## 2015-08-22 DIAGNOSIS — N2581 Secondary hyperparathyroidism of renal origin: Secondary | ICD-10-CM | POA: Diagnosis not present

## 2015-08-22 DIAGNOSIS — Z5189 Encounter for other specified aftercare: Secondary | ICD-10-CM | POA: Diagnosis not present

## 2015-08-22 DIAGNOSIS — D509 Iron deficiency anemia, unspecified: Secondary | ICD-10-CM | POA: Diagnosis not present

## 2015-08-22 DIAGNOSIS — Z79899 Other long term (current) drug therapy: Secondary | ICD-10-CM | POA: Diagnosis not present

## 2015-08-23 DIAGNOSIS — D509 Iron deficiency anemia, unspecified: Secondary | ICD-10-CM | POA: Diagnosis not present

## 2015-08-23 DIAGNOSIS — Z79899 Other long term (current) drug therapy: Secondary | ICD-10-CM | POA: Diagnosis not present

## 2015-08-23 DIAGNOSIS — Z5189 Encounter for other specified aftercare: Secondary | ICD-10-CM | POA: Diagnosis not present

## 2015-08-23 DIAGNOSIS — N186 End stage renal disease: Secondary | ICD-10-CM | POA: Diagnosis not present

## 2015-08-23 DIAGNOSIS — N2581 Secondary hyperparathyroidism of renal origin: Secondary | ICD-10-CM | POA: Diagnosis not present

## 2015-08-23 DIAGNOSIS — D631 Anemia in chronic kidney disease: Secondary | ICD-10-CM | POA: Diagnosis not present

## 2015-08-24 DIAGNOSIS — D509 Iron deficiency anemia, unspecified: Secondary | ICD-10-CM | POA: Diagnosis not present

## 2015-08-24 DIAGNOSIS — D631 Anemia in chronic kidney disease: Secondary | ICD-10-CM | POA: Diagnosis not present

## 2015-08-24 DIAGNOSIS — Z79899 Other long term (current) drug therapy: Secondary | ICD-10-CM | POA: Diagnosis not present

## 2015-08-24 DIAGNOSIS — N2581 Secondary hyperparathyroidism of renal origin: Secondary | ICD-10-CM | POA: Diagnosis not present

## 2015-08-24 DIAGNOSIS — N186 End stage renal disease: Secondary | ICD-10-CM | POA: Diagnosis not present

## 2015-08-24 DIAGNOSIS — Z5189 Encounter for other specified aftercare: Secondary | ICD-10-CM | POA: Diagnosis not present

## 2015-08-25 DIAGNOSIS — D631 Anemia in chronic kidney disease: Secondary | ICD-10-CM | POA: Diagnosis not present

## 2015-08-25 DIAGNOSIS — N186 End stage renal disease: Secondary | ICD-10-CM | POA: Diagnosis not present

## 2015-08-25 DIAGNOSIS — Z79899 Other long term (current) drug therapy: Secondary | ICD-10-CM | POA: Diagnosis not present

## 2015-08-25 DIAGNOSIS — Z5189 Encounter for other specified aftercare: Secondary | ICD-10-CM | POA: Diagnosis not present

## 2015-08-25 DIAGNOSIS — N2581 Secondary hyperparathyroidism of renal origin: Secondary | ICD-10-CM | POA: Diagnosis not present

## 2015-08-25 DIAGNOSIS — D509 Iron deficiency anemia, unspecified: Secondary | ICD-10-CM | POA: Diagnosis not present

## 2015-08-26 DIAGNOSIS — Z79899 Other long term (current) drug therapy: Secondary | ICD-10-CM | POA: Diagnosis not present

## 2015-08-26 DIAGNOSIS — D509 Iron deficiency anemia, unspecified: Secondary | ICD-10-CM | POA: Diagnosis not present

## 2015-08-26 DIAGNOSIS — N2581 Secondary hyperparathyroidism of renal origin: Secondary | ICD-10-CM | POA: Diagnosis not present

## 2015-08-26 DIAGNOSIS — D631 Anemia in chronic kidney disease: Secondary | ICD-10-CM | POA: Diagnosis not present

## 2015-08-26 DIAGNOSIS — N186 End stage renal disease: Secondary | ICD-10-CM | POA: Diagnosis not present

## 2015-08-26 DIAGNOSIS — Z5189 Encounter for other specified aftercare: Secondary | ICD-10-CM | POA: Diagnosis not present

## 2015-08-27 DIAGNOSIS — N186 End stage renal disease: Secondary | ICD-10-CM | POA: Diagnosis not present

## 2015-08-27 DIAGNOSIS — Z992 Dependence on renal dialysis: Secondary | ICD-10-CM | POA: Diagnosis not present

## 2015-08-27 DIAGNOSIS — D509 Iron deficiency anemia, unspecified: Secondary | ICD-10-CM | POA: Diagnosis not present

## 2015-08-27 DIAGNOSIS — D631 Anemia in chronic kidney disease: Secondary | ICD-10-CM | POA: Diagnosis not present

## 2015-08-27 DIAGNOSIS — I129 Hypertensive chronic kidney disease with stage 1 through stage 4 chronic kidney disease, or unspecified chronic kidney disease: Secondary | ICD-10-CM | POA: Diagnosis not present

## 2015-08-27 DIAGNOSIS — Z5189 Encounter for other specified aftercare: Secondary | ICD-10-CM | POA: Diagnosis not present

## 2015-08-27 DIAGNOSIS — N2581 Secondary hyperparathyroidism of renal origin: Secondary | ICD-10-CM | POA: Diagnosis not present

## 2015-08-27 DIAGNOSIS — Z79899 Other long term (current) drug therapy: Secondary | ICD-10-CM | POA: Diagnosis not present

## 2015-08-28 DIAGNOSIS — D631 Anemia in chronic kidney disease: Secondary | ICD-10-CM | POA: Diagnosis not present

## 2015-08-28 DIAGNOSIS — K769 Liver disease, unspecified: Secondary | ICD-10-CM | POA: Diagnosis not present

## 2015-08-28 DIAGNOSIS — N186 End stage renal disease: Secondary | ICD-10-CM | POA: Diagnosis not present

## 2015-08-28 DIAGNOSIS — Z4932 Encounter for adequacy testing for peritoneal dialysis: Secondary | ICD-10-CM | POA: Diagnosis not present

## 2015-08-28 DIAGNOSIS — N2581 Secondary hyperparathyroidism of renal origin: Secondary | ICD-10-CM | POA: Diagnosis not present

## 2015-08-28 DIAGNOSIS — D509 Iron deficiency anemia, unspecified: Secondary | ICD-10-CM | POA: Diagnosis not present

## 2015-08-28 DIAGNOSIS — N2589 Other disorders resulting from impaired renal tubular function: Secondary | ICD-10-CM | POA: Diagnosis not present

## 2015-08-29 DIAGNOSIS — K769 Liver disease, unspecified: Secondary | ICD-10-CM | POA: Diagnosis not present

## 2015-08-29 DIAGNOSIS — D509 Iron deficiency anemia, unspecified: Secondary | ICD-10-CM | POA: Diagnosis not present

## 2015-08-29 DIAGNOSIS — N186 End stage renal disease: Secondary | ICD-10-CM | POA: Diagnosis not present

## 2015-08-29 DIAGNOSIS — D631 Anemia in chronic kidney disease: Secondary | ICD-10-CM | POA: Diagnosis not present

## 2015-08-29 DIAGNOSIS — Z4932 Encounter for adequacy testing for peritoneal dialysis: Secondary | ICD-10-CM | POA: Diagnosis not present

## 2015-08-29 DIAGNOSIS — N2581 Secondary hyperparathyroidism of renal origin: Secondary | ICD-10-CM | POA: Diagnosis not present

## 2015-08-30 DIAGNOSIS — K769 Liver disease, unspecified: Secondary | ICD-10-CM | POA: Diagnosis not present

## 2015-08-30 DIAGNOSIS — D631 Anemia in chronic kidney disease: Secondary | ICD-10-CM | POA: Diagnosis not present

## 2015-08-30 DIAGNOSIS — D509 Iron deficiency anemia, unspecified: Secondary | ICD-10-CM | POA: Diagnosis not present

## 2015-08-30 DIAGNOSIS — N2581 Secondary hyperparathyroidism of renal origin: Secondary | ICD-10-CM | POA: Diagnosis not present

## 2015-08-30 DIAGNOSIS — N186 End stage renal disease: Secondary | ICD-10-CM | POA: Diagnosis not present

## 2015-08-30 DIAGNOSIS — Z4932 Encounter for adequacy testing for peritoneal dialysis: Secondary | ICD-10-CM | POA: Diagnosis not present

## 2015-08-31 DIAGNOSIS — E784 Other hyperlipidemia: Secondary | ICD-10-CM | POA: Diagnosis not present

## 2015-08-31 DIAGNOSIS — N2581 Secondary hyperparathyroidism of renal origin: Secondary | ICD-10-CM | POA: Diagnosis not present

## 2015-08-31 DIAGNOSIS — E1129 Type 2 diabetes mellitus with other diabetic kidney complication: Secondary | ICD-10-CM | POA: Diagnosis not present

## 2015-08-31 DIAGNOSIS — N186 End stage renal disease: Secondary | ICD-10-CM | POA: Diagnosis not present

## 2015-08-31 DIAGNOSIS — R8299 Other abnormal findings in urine: Secondary | ICD-10-CM | POA: Diagnosis not present

## 2015-08-31 DIAGNOSIS — Z4932 Encounter for adequacy testing for peritoneal dialysis: Secondary | ICD-10-CM | POA: Diagnosis not present

## 2015-08-31 DIAGNOSIS — D631 Anemia in chronic kidney disease: Secondary | ICD-10-CM | POA: Diagnosis not present

## 2015-08-31 DIAGNOSIS — K769 Liver disease, unspecified: Secondary | ICD-10-CM | POA: Diagnosis not present

## 2015-08-31 DIAGNOSIS — D509 Iron deficiency anemia, unspecified: Secondary | ICD-10-CM | POA: Diagnosis not present

## 2015-09-01 DIAGNOSIS — D509 Iron deficiency anemia, unspecified: Secondary | ICD-10-CM | POA: Diagnosis not present

## 2015-09-01 DIAGNOSIS — K769 Liver disease, unspecified: Secondary | ICD-10-CM | POA: Diagnosis not present

## 2015-09-01 DIAGNOSIS — N2581 Secondary hyperparathyroidism of renal origin: Secondary | ICD-10-CM | POA: Diagnosis not present

## 2015-09-01 DIAGNOSIS — Z4932 Encounter for adequacy testing for peritoneal dialysis: Secondary | ICD-10-CM | POA: Diagnosis not present

## 2015-09-01 DIAGNOSIS — D631 Anemia in chronic kidney disease: Secondary | ICD-10-CM | POA: Diagnosis not present

## 2015-09-01 DIAGNOSIS — N186 End stage renal disease: Secondary | ICD-10-CM | POA: Diagnosis not present

## 2015-09-02 DIAGNOSIS — Z4932 Encounter for adequacy testing for peritoneal dialysis: Secondary | ICD-10-CM | POA: Diagnosis not present

## 2015-09-02 DIAGNOSIS — D509 Iron deficiency anemia, unspecified: Secondary | ICD-10-CM | POA: Diagnosis not present

## 2015-09-02 DIAGNOSIS — D631 Anemia in chronic kidney disease: Secondary | ICD-10-CM | POA: Diagnosis not present

## 2015-09-02 DIAGNOSIS — K769 Liver disease, unspecified: Secondary | ICD-10-CM | POA: Diagnosis not present

## 2015-09-02 DIAGNOSIS — N2581 Secondary hyperparathyroidism of renal origin: Secondary | ICD-10-CM | POA: Diagnosis not present

## 2015-09-02 DIAGNOSIS — N186 End stage renal disease: Secondary | ICD-10-CM | POA: Diagnosis not present

## 2015-09-03 DIAGNOSIS — Z4932 Encounter for adequacy testing for peritoneal dialysis: Secondary | ICD-10-CM | POA: Diagnosis not present

## 2015-09-03 DIAGNOSIS — D509 Iron deficiency anemia, unspecified: Secondary | ICD-10-CM | POA: Diagnosis not present

## 2015-09-03 DIAGNOSIS — N186 End stage renal disease: Secondary | ICD-10-CM | POA: Diagnosis not present

## 2015-09-03 DIAGNOSIS — K769 Liver disease, unspecified: Secondary | ICD-10-CM | POA: Diagnosis not present

## 2015-09-03 DIAGNOSIS — D631 Anemia in chronic kidney disease: Secondary | ICD-10-CM | POA: Diagnosis not present

## 2015-09-03 DIAGNOSIS — N2581 Secondary hyperparathyroidism of renal origin: Secondary | ICD-10-CM | POA: Diagnosis not present

## 2015-09-04 DIAGNOSIS — Z4932 Encounter for adequacy testing for peritoneal dialysis: Secondary | ICD-10-CM | POA: Diagnosis not present

## 2015-09-04 DIAGNOSIS — D509 Iron deficiency anemia, unspecified: Secondary | ICD-10-CM | POA: Diagnosis not present

## 2015-09-04 DIAGNOSIS — N186 End stage renal disease: Secondary | ICD-10-CM | POA: Diagnosis not present

## 2015-09-04 DIAGNOSIS — N2581 Secondary hyperparathyroidism of renal origin: Secondary | ICD-10-CM | POA: Diagnosis not present

## 2015-09-04 DIAGNOSIS — D631 Anemia in chronic kidney disease: Secondary | ICD-10-CM | POA: Diagnosis not present

## 2015-09-04 DIAGNOSIS — K769 Liver disease, unspecified: Secondary | ICD-10-CM | POA: Diagnosis not present

## 2015-09-05 DIAGNOSIS — N2581 Secondary hyperparathyroidism of renal origin: Secondary | ICD-10-CM | POA: Diagnosis not present

## 2015-09-05 DIAGNOSIS — K769 Liver disease, unspecified: Secondary | ICD-10-CM | POA: Diagnosis not present

## 2015-09-05 DIAGNOSIS — Z4932 Encounter for adequacy testing for peritoneal dialysis: Secondary | ICD-10-CM | POA: Diagnosis not present

## 2015-09-05 DIAGNOSIS — D509 Iron deficiency anemia, unspecified: Secondary | ICD-10-CM | POA: Diagnosis not present

## 2015-09-05 DIAGNOSIS — D631 Anemia in chronic kidney disease: Secondary | ICD-10-CM | POA: Diagnosis not present

## 2015-09-05 DIAGNOSIS — N186 End stage renal disease: Secondary | ICD-10-CM | POA: Diagnosis not present

## 2015-09-06 DIAGNOSIS — K769 Liver disease, unspecified: Secondary | ICD-10-CM | POA: Diagnosis not present

## 2015-09-06 DIAGNOSIS — D509 Iron deficiency anemia, unspecified: Secondary | ICD-10-CM | POA: Diagnosis not present

## 2015-09-06 DIAGNOSIS — N2581 Secondary hyperparathyroidism of renal origin: Secondary | ICD-10-CM | POA: Diagnosis not present

## 2015-09-06 DIAGNOSIS — D631 Anemia in chronic kidney disease: Secondary | ICD-10-CM | POA: Diagnosis not present

## 2015-09-06 DIAGNOSIS — N186 End stage renal disease: Secondary | ICD-10-CM | POA: Diagnosis not present

## 2015-09-06 DIAGNOSIS — Z4932 Encounter for adequacy testing for peritoneal dialysis: Secondary | ICD-10-CM | POA: Diagnosis not present

## 2015-09-07 DIAGNOSIS — K769 Liver disease, unspecified: Secondary | ICD-10-CM | POA: Diagnosis not present

## 2015-09-07 DIAGNOSIS — D509 Iron deficiency anemia, unspecified: Secondary | ICD-10-CM | POA: Diagnosis not present

## 2015-09-07 DIAGNOSIS — Z4932 Encounter for adequacy testing for peritoneal dialysis: Secondary | ICD-10-CM | POA: Diagnosis not present

## 2015-09-07 DIAGNOSIS — N2581 Secondary hyperparathyroidism of renal origin: Secondary | ICD-10-CM | POA: Diagnosis not present

## 2015-09-07 DIAGNOSIS — N186 End stage renal disease: Secondary | ICD-10-CM | POA: Diagnosis not present

## 2015-09-07 DIAGNOSIS — D631 Anemia in chronic kidney disease: Secondary | ICD-10-CM | POA: Diagnosis not present

## 2015-09-08 DIAGNOSIS — D631 Anemia in chronic kidney disease: Secondary | ICD-10-CM | POA: Diagnosis not present

## 2015-09-08 DIAGNOSIS — D509 Iron deficiency anemia, unspecified: Secondary | ICD-10-CM | POA: Diagnosis not present

## 2015-09-08 DIAGNOSIS — N186 End stage renal disease: Secondary | ICD-10-CM | POA: Diagnosis not present

## 2015-09-08 DIAGNOSIS — K769 Liver disease, unspecified: Secondary | ICD-10-CM | POA: Diagnosis not present

## 2015-09-08 DIAGNOSIS — Z4932 Encounter for adequacy testing for peritoneal dialysis: Secondary | ICD-10-CM | POA: Diagnosis not present

## 2015-09-08 DIAGNOSIS — N2581 Secondary hyperparathyroidism of renal origin: Secondary | ICD-10-CM | POA: Diagnosis not present

## 2015-09-09 DIAGNOSIS — Z4932 Encounter for adequacy testing for peritoneal dialysis: Secondary | ICD-10-CM | POA: Diagnosis not present

## 2015-09-09 DIAGNOSIS — N2581 Secondary hyperparathyroidism of renal origin: Secondary | ICD-10-CM | POA: Diagnosis not present

## 2015-09-09 DIAGNOSIS — D509 Iron deficiency anemia, unspecified: Secondary | ICD-10-CM | POA: Diagnosis not present

## 2015-09-09 DIAGNOSIS — K769 Liver disease, unspecified: Secondary | ICD-10-CM | POA: Diagnosis not present

## 2015-09-09 DIAGNOSIS — D631 Anemia in chronic kidney disease: Secondary | ICD-10-CM | POA: Diagnosis not present

## 2015-09-09 DIAGNOSIS — N186 End stage renal disease: Secondary | ICD-10-CM | POA: Diagnosis not present

## 2015-09-10 DIAGNOSIS — N2581 Secondary hyperparathyroidism of renal origin: Secondary | ICD-10-CM | POA: Diagnosis not present

## 2015-09-10 DIAGNOSIS — D509 Iron deficiency anemia, unspecified: Secondary | ICD-10-CM | POA: Diagnosis not present

## 2015-09-10 DIAGNOSIS — N186 End stage renal disease: Secondary | ICD-10-CM | POA: Diagnosis not present

## 2015-09-10 DIAGNOSIS — Z4932 Encounter for adequacy testing for peritoneal dialysis: Secondary | ICD-10-CM | POA: Diagnosis not present

## 2015-09-10 DIAGNOSIS — D631 Anemia in chronic kidney disease: Secondary | ICD-10-CM | POA: Diagnosis not present

## 2015-09-10 DIAGNOSIS — K769 Liver disease, unspecified: Secondary | ICD-10-CM | POA: Diagnosis not present

## 2015-09-11 DIAGNOSIS — D509 Iron deficiency anemia, unspecified: Secondary | ICD-10-CM | POA: Diagnosis not present

## 2015-09-11 DIAGNOSIS — N186 End stage renal disease: Secondary | ICD-10-CM | POA: Diagnosis not present

## 2015-09-11 DIAGNOSIS — D631 Anemia in chronic kidney disease: Secondary | ICD-10-CM | POA: Diagnosis not present

## 2015-09-11 DIAGNOSIS — N2581 Secondary hyperparathyroidism of renal origin: Secondary | ICD-10-CM | POA: Diagnosis not present

## 2015-09-11 DIAGNOSIS — K769 Liver disease, unspecified: Secondary | ICD-10-CM | POA: Diagnosis not present

## 2015-09-11 DIAGNOSIS — Z4932 Encounter for adequacy testing for peritoneal dialysis: Secondary | ICD-10-CM | POA: Diagnosis not present

## 2015-09-12 DIAGNOSIS — N2581 Secondary hyperparathyroidism of renal origin: Secondary | ICD-10-CM | POA: Diagnosis not present

## 2015-09-12 DIAGNOSIS — Z4932 Encounter for adequacy testing for peritoneal dialysis: Secondary | ICD-10-CM | POA: Diagnosis not present

## 2015-09-12 DIAGNOSIS — D509 Iron deficiency anemia, unspecified: Secondary | ICD-10-CM | POA: Diagnosis not present

## 2015-09-12 DIAGNOSIS — K769 Liver disease, unspecified: Secondary | ICD-10-CM | POA: Diagnosis not present

## 2015-09-12 DIAGNOSIS — N186 End stage renal disease: Secondary | ICD-10-CM | POA: Diagnosis not present

## 2015-09-12 DIAGNOSIS — D631 Anemia in chronic kidney disease: Secondary | ICD-10-CM | POA: Diagnosis not present

## 2015-09-13 DIAGNOSIS — D509 Iron deficiency anemia, unspecified: Secondary | ICD-10-CM | POA: Diagnosis not present

## 2015-09-13 DIAGNOSIS — D631 Anemia in chronic kidney disease: Secondary | ICD-10-CM | POA: Diagnosis not present

## 2015-09-13 DIAGNOSIS — N186 End stage renal disease: Secondary | ICD-10-CM | POA: Diagnosis not present

## 2015-09-13 DIAGNOSIS — N2581 Secondary hyperparathyroidism of renal origin: Secondary | ICD-10-CM | POA: Diagnosis not present

## 2015-09-13 DIAGNOSIS — Z4932 Encounter for adequacy testing for peritoneal dialysis: Secondary | ICD-10-CM | POA: Diagnosis not present

## 2015-09-13 DIAGNOSIS — K769 Liver disease, unspecified: Secondary | ICD-10-CM | POA: Diagnosis not present

## 2015-09-14 DIAGNOSIS — D509 Iron deficiency anemia, unspecified: Secondary | ICD-10-CM | POA: Diagnosis not present

## 2015-09-14 DIAGNOSIS — N2581 Secondary hyperparathyroidism of renal origin: Secondary | ICD-10-CM | POA: Diagnosis not present

## 2015-09-14 DIAGNOSIS — Z4932 Encounter for adequacy testing for peritoneal dialysis: Secondary | ICD-10-CM | POA: Diagnosis not present

## 2015-09-14 DIAGNOSIS — N186 End stage renal disease: Secondary | ICD-10-CM | POA: Diagnosis not present

## 2015-09-14 DIAGNOSIS — K769 Liver disease, unspecified: Secondary | ICD-10-CM | POA: Diagnosis not present

## 2015-09-14 DIAGNOSIS — D631 Anemia in chronic kidney disease: Secondary | ICD-10-CM | POA: Diagnosis not present

## 2015-09-15 DIAGNOSIS — N186 End stage renal disease: Secondary | ICD-10-CM | POA: Diagnosis not present

## 2015-09-15 DIAGNOSIS — N2581 Secondary hyperparathyroidism of renal origin: Secondary | ICD-10-CM | POA: Diagnosis not present

## 2015-09-15 DIAGNOSIS — Z4932 Encounter for adequacy testing for peritoneal dialysis: Secondary | ICD-10-CM | POA: Diagnosis not present

## 2015-09-15 DIAGNOSIS — D509 Iron deficiency anemia, unspecified: Secondary | ICD-10-CM | POA: Diagnosis not present

## 2015-09-15 DIAGNOSIS — K769 Liver disease, unspecified: Secondary | ICD-10-CM | POA: Diagnosis not present

## 2015-09-15 DIAGNOSIS — D631 Anemia in chronic kidney disease: Secondary | ICD-10-CM | POA: Diagnosis not present

## 2015-09-16 DIAGNOSIS — N2581 Secondary hyperparathyroidism of renal origin: Secondary | ICD-10-CM | POA: Diagnosis not present

## 2015-09-16 DIAGNOSIS — K769 Liver disease, unspecified: Secondary | ICD-10-CM | POA: Diagnosis not present

## 2015-09-16 DIAGNOSIS — Z4932 Encounter for adequacy testing for peritoneal dialysis: Secondary | ICD-10-CM | POA: Diagnosis not present

## 2015-09-16 DIAGNOSIS — D509 Iron deficiency anemia, unspecified: Secondary | ICD-10-CM | POA: Diagnosis not present

## 2015-09-16 DIAGNOSIS — D631 Anemia in chronic kidney disease: Secondary | ICD-10-CM | POA: Diagnosis not present

## 2015-09-16 DIAGNOSIS — N186 End stage renal disease: Secondary | ICD-10-CM | POA: Diagnosis not present

## 2015-09-17 DIAGNOSIS — D631 Anemia in chronic kidney disease: Secondary | ICD-10-CM | POA: Diagnosis not present

## 2015-09-17 DIAGNOSIS — K769 Liver disease, unspecified: Secondary | ICD-10-CM | POA: Diagnosis not present

## 2015-09-17 DIAGNOSIS — N2581 Secondary hyperparathyroidism of renal origin: Secondary | ICD-10-CM | POA: Diagnosis not present

## 2015-09-17 DIAGNOSIS — N186 End stage renal disease: Secondary | ICD-10-CM | POA: Diagnosis not present

## 2015-09-17 DIAGNOSIS — D509 Iron deficiency anemia, unspecified: Secondary | ICD-10-CM | POA: Diagnosis not present

## 2015-09-17 DIAGNOSIS — Z4932 Encounter for adequacy testing for peritoneal dialysis: Secondary | ICD-10-CM | POA: Diagnosis not present

## 2015-09-18 DIAGNOSIS — N2581 Secondary hyperparathyroidism of renal origin: Secondary | ICD-10-CM | POA: Diagnosis not present

## 2015-09-18 DIAGNOSIS — K769 Liver disease, unspecified: Secondary | ICD-10-CM | POA: Diagnosis not present

## 2015-09-18 DIAGNOSIS — D509 Iron deficiency anemia, unspecified: Secondary | ICD-10-CM | POA: Diagnosis not present

## 2015-09-18 DIAGNOSIS — Z4932 Encounter for adequacy testing for peritoneal dialysis: Secondary | ICD-10-CM | POA: Diagnosis not present

## 2015-09-18 DIAGNOSIS — D631 Anemia in chronic kidney disease: Secondary | ICD-10-CM | POA: Diagnosis not present

## 2015-09-18 DIAGNOSIS — N186 End stage renal disease: Secondary | ICD-10-CM | POA: Diagnosis not present

## 2015-09-19 DIAGNOSIS — N186 End stage renal disease: Secondary | ICD-10-CM | POA: Diagnosis not present

## 2015-09-19 DIAGNOSIS — N2581 Secondary hyperparathyroidism of renal origin: Secondary | ICD-10-CM | POA: Diagnosis not present

## 2015-09-19 DIAGNOSIS — D631 Anemia in chronic kidney disease: Secondary | ICD-10-CM | POA: Diagnosis not present

## 2015-09-19 DIAGNOSIS — K769 Liver disease, unspecified: Secondary | ICD-10-CM | POA: Diagnosis not present

## 2015-09-19 DIAGNOSIS — D509 Iron deficiency anemia, unspecified: Secondary | ICD-10-CM | POA: Diagnosis not present

## 2015-09-19 DIAGNOSIS — Z4932 Encounter for adequacy testing for peritoneal dialysis: Secondary | ICD-10-CM | POA: Diagnosis not present

## 2015-09-20 DIAGNOSIS — N186 End stage renal disease: Secondary | ICD-10-CM | POA: Diagnosis not present

## 2015-09-20 DIAGNOSIS — D631 Anemia in chronic kidney disease: Secondary | ICD-10-CM | POA: Diagnosis not present

## 2015-09-20 DIAGNOSIS — N2581 Secondary hyperparathyroidism of renal origin: Secondary | ICD-10-CM | POA: Diagnosis not present

## 2015-09-20 DIAGNOSIS — Z4932 Encounter for adequacy testing for peritoneal dialysis: Secondary | ICD-10-CM | POA: Diagnosis not present

## 2015-09-20 DIAGNOSIS — D509 Iron deficiency anemia, unspecified: Secondary | ICD-10-CM | POA: Diagnosis not present

## 2015-09-20 DIAGNOSIS — K769 Liver disease, unspecified: Secondary | ICD-10-CM | POA: Diagnosis not present

## 2015-09-21 DIAGNOSIS — D631 Anemia in chronic kidney disease: Secondary | ICD-10-CM | POA: Diagnosis not present

## 2015-09-21 DIAGNOSIS — Z4932 Encounter for adequacy testing for peritoneal dialysis: Secondary | ICD-10-CM | POA: Diagnosis not present

## 2015-09-21 DIAGNOSIS — D509 Iron deficiency anemia, unspecified: Secondary | ICD-10-CM | POA: Diagnosis not present

## 2015-09-21 DIAGNOSIS — K769 Liver disease, unspecified: Secondary | ICD-10-CM | POA: Diagnosis not present

## 2015-09-21 DIAGNOSIS — N186 End stage renal disease: Secondary | ICD-10-CM | POA: Diagnosis not present

## 2015-09-21 DIAGNOSIS — N2581 Secondary hyperparathyroidism of renal origin: Secondary | ICD-10-CM | POA: Diagnosis not present

## 2015-09-22 DIAGNOSIS — Z4932 Encounter for adequacy testing for peritoneal dialysis: Secondary | ICD-10-CM | POA: Diagnosis not present

## 2015-09-22 DIAGNOSIS — D509 Iron deficiency anemia, unspecified: Secondary | ICD-10-CM | POA: Diagnosis not present

## 2015-09-22 DIAGNOSIS — N2581 Secondary hyperparathyroidism of renal origin: Secondary | ICD-10-CM | POA: Diagnosis not present

## 2015-09-22 DIAGNOSIS — N186 End stage renal disease: Secondary | ICD-10-CM | POA: Diagnosis not present

## 2015-09-22 DIAGNOSIS — K769 Liver disease, unspecified: Secondary | ICD-10-CM | POA: Diagnosis not present

## 2015-09-22 DIAGNOSIS — D631 Anemia in chronic kidney disease: Secondary | ICD-10-CM | POA: Diagnosis not present

## 2015-09-23 DIAGNOSIS — Z4932 Encounter for adequacy testing for peritoneal dialysis: Secondary | ICD-10-CM | POA: Diagnosis not present

## 2015-09-23 DIAGNOSIS — N186 End stage renal disease: Secondary | ICD-10-CM | POA: Diagnosis not present

## 2015-09-23 DIAGNOSIS — K769 Liver disease, unspecified: Secondary | ICD-10-CM | POA: Diagnosis not present

## 2015-09-23 DIAGNOSIS — D509 Iron deficiency anemia, unspecified: Secondary | ICD-10-CM | POA: Diagnosis not present

## 2015-09-23 DIAGNOSIS — D631 Anemia in chronic kidney disease: Secondary | ICD-10-CM | POA: Diagnosis not present

## 2015-09-23 DIAGNOSIS — N2581 Secondary hyperparathyroidism of renal origin: Secondary | ICD-10-CM | POA: Diagnosis not present

## 2015-09-24 DIAGNOSIS — N2581 Secondary hyperparathyroidism of renal origin: Secondary | ICD-10-CM | POA: Diagnosis not present

## 2015-09-24 DIAGNOSIS — D509 Iron deficiency anemia, unspecified: Secondary | ICD-10-CM | POA: Diagnosis not present

## 2015-09-24 DIAGNOSIS — N186 End stage renal disease: Secondary | ICD-10-CM | POA: Diagnosis not present

## 2015-09-24 DIAGNOSIS — Z4932 Encounter for adequacy testing for peritoneal dialysis: Secondary | ICD-10-CM | POA: Diagnosis not present

## 2015-09-24 DIAGNOSIS — K769 Liver disease, unspecified: Secondary | ICD-10-CM | POA: Diagnosis not present

## 2015-09-24 DIAGNOSIS — D631 Anemia in chronic kidney disease: Secondary | ICD-10-CM | POA: Diagnosis not present

## 2015-09-25 DIAGNOSIS — K769 Liver disease, unspecified: Secondary | ICD-10-CM | POA: Diagnosis not present

## 2015-09-25 DIAGNOSIS — N2581 Secondary hyperparathyroidism of renal origin: Secondary | ICD-10-CM | POA: Diagnosis not present

## 2015-09-25 DIAGNOSIS — Z4932 Encounter for adequacy testing for peritoneal dialysis: Secondary | ICD-10-CM | POA: Diagnosis not present

## 2015-09-25 DIAGNOSIS — N186 End stage renal disease: Secondary | ICD-10-CM | POA: Diagnosis not present

## 2015-09-25 DIAGNOSIS — D631 Anemia in chronic kidney disease: Secondary | ICD-10-CM | POA: Diagnosis not present

## 2015-09-25 DIAGNOSIS — D509 Iron deficiency anemia, unspecified: Secondary | ICD-10-CM | POA: Diagnosis not present

## 2015-09-26 DIAGNOSIS — N186 End stage renal disease: Secondary | ICD-10-CM | POA: Diagnosis not present

## 2015-09-26 DIAGNOSIS — D631 Anemia in chronic kidney disease: Secondary | ICD-10-CM | POA: Diagnosis not present

## 2015-09-26 DIAGNOSIS — K769 Liver disease, unspecified: Secondary | ICD-10-CM | POA: Diagnosis not present

## 2015-09-26 DIAGNOSIS — N2581 Secondary hyperparathyroidism of renal origin: Secondary | ICD-10-CM | POA: Diagnosis not present

## 2015-09-26 DIAGNOSIS — D509 Iron deficiency anemia, unspecified: Secondary | ICD-10-CM | POA: Diagnosis not present

## 2015-09-26 DIAGNOSIS — Z4932 Encounter for adequacy testing for peritoneal dialysis: Secondary | ICD-10-CM | POA: Diagnosis not present

## 2015-09-27 DIAGNOSIS — N186 End stage renal disease: Secondary | ICD-10-CM | POA: Diagnosis not present

## 2015-09-27 DIAGNOSIS — Z992 Dependence on renal dialysis: Secondary | ICD-10-CM | POA: Diagnosis not present

## 2015-09-27 DIAGNOSIS — D631 Anemia in chronic kidney disease: Secondary | ICD-10-CM | POA: Diagnosis not present

## 2015-09-27 DIAGNOSIS — Z4932 Encounter for adequacy testing for peritoneal dialysis: Secondary | ICD-10-CM | POA: Diagnosis not present

## 2015-09-27 DIAGNOSIS — I129 Hypertensive chronic kidney disease with stage 1 through stage 4 chronic kidney disease, or unspecified chronic kidney disease: Secondary | ICD-10-CM | POA: Diagnosis not present

## 2015-09-27 DIAGNOSIS — D509 Iron deficiency anemia, unspecified: Secondary | ICD-10-CM | POA: Diagnosis not present

## 2015-09-27 DIAGNOSIS — K769 Liver disease, unspecified: Secondary | ICD-10-CM | POA: Diagnosis not present

## 2015-09-27 DIAGNOSIS — N2581 Secondary hyperparathyroidism of renal origin: Secondary | ICD-10-CM | POA: Diagnosis not present

## 2015-09-28 DIAGNOSIS — D631 Anemia in chronic kidney disease: Secondary | ICD-10-CM | POA: Diagnosis not present

## 2015-09-28 DIAGNOSIS — N2581 Secondary hyperparathyroidism of renal origin: Secondary | ICD-10-CM | POA: Diagnosis not present

## 2015-09-28 DIAGNOSIS — Z79899 Other long term (current) drug therapy: Secondary | ICD-10-CM | POA: Diagnosis not present

## 2015-09-28 DIAGNOSIS — N186 End stage renal disease: Secondary | ICD-10-CM | POA: Diagnosis not present

## 2015-09-28 DIAGNOSIS — K769 Liver disease, unspecified: Secondary | ICD-10-CM | POA: Diagnosis not present

## 2015-09-28 DIAGNOSIS — D509 Iron deficiency anemia, unspecified: Secondary | ICD-10-CM | POA: Diagnosis not present

## 2015-09-28 DIAGNOSIS — Z5189 Encounter for other specified aftercare: Secondary | ICD-10-CM | POA: Diagnosis not present

## 2015-09-28 DIAGNOSIS — E440 Moderate protein-calorie malnutrition: Secondary | ICD-10-CM | POA: Diagnosis not present

## 2015-09-29 DIAGNOSIS — K769 Liver disease, unspecified: Secondary | ICD-10-CM | POA: Diagnosis not present

## 2015-09-29 DIAGNOSIS — N186 End stage renal disease: Secondary | ICD-10-CM | POA: Diagnosis not present

## 2015-09-29 DIAGNOSIS — E440 Moderate protein-calorie malnutrition: Secondary | ICD-10-CM | POA: Diagnosis not present

## 2015-09-29 DIAGNOSIS — D509 Iron deficiency anemia, unspecified: Secondary | ICD-10-CM | POA: Diagnosis not present

## 2015-09-29 DIAGNOSIS — N2581 Secondary hyperparathyroidism of renal origin: Secondary | ICD-10-CM | POA: Diagnosis not present

## 2015-09-29 DIAGNOSIS — D631 Anemia in chronic kidney disease: Secondary | ICD-10-CM | POA: Diagnosis not present

## 2015-09-30 DIAGNOSIS — N186 End stage renal disease: Secondary | ICD-10-CM | POA: Diagnosis not present

## 2015-09-30 DIAGNOSIS — N2581 Secondary hyperparathyroidism of renal origin: Secondary | ICD-10-CM | POA: Diagnosis not present

## 2015-09-30 DIAGNOSIS — E440 Moderate protein-calorie malnutrition: Secondary | ICD-10-CM | POA: Diagnosis not present

## 2015-09-30 DIAGNOSIS — D509 Iron deficiency anemia, unspecified: Secondary | ICD-10-CM | POA: Diagnosis not present

## 2015-09-30 DIAGNOSIS — D631 Anemia in chronic kidney disease: Secondary | ICD-10-CM | POA: Diagnosis not present

## 2015-09-30 DIAGNOSIS — K769 Liver disease, unspecified: Secondary | ICD-10-CM | POA: Diagnosis not present

## 2015-10-01 DIAGNOSIS — D509 Iron deficiency anemia, unspecified: Secondary | ICD-10-CM | POA: Diagnosis not present

## 2015-10-01 DIAGNOSIS — K769 Liver disease, unspecified: Secondary | ICD-10-CM | POA: Diagnosis not present

## 2015-10-01 DIAGNOSIS — E440 Moderate protein-calorie malnutrition: Secondary | ICD-10-CM | POA: Diagnosis not present

## 2015-10-01 DIAGNOSIS — D631 Anemia in chronic kidney disease: Secondary | ICD-10-CM | POA: Diagnosis not present

## 2015-10-01 DIAGNOSIS — R8299 Other abnormal findings in urine: Secondary | ICD-10-CM | POA: Diagnosis not present

## 2015-10-01 DIAGNOSIS — N186 End stage renal disease: Secondary | ICD-10-CM | POA: Diagnosis not present

## 2015-10-01 DIAGNOSIS — N2581 Secondary hyperparathyroidism of renal origin: Secondary | ICD-10-CM | POA: Diagnosis not present

## 2015-10-02 DIAGNOSIS — N186 End stage renal disease: Secondary | ICD-10-CM | POA: Diagnosis not present

## 2015-10-02 DIAGNOSIS — N2581 Secondary hyperparathyroidism of renal origin: Secondary | ICD-10-CM | POA: Diagnosis not present

## 2015-10-02 DIAGNOSIS — D631 Anemia in chronic kidney disease: Secondary | ICD-10-CM | POA: Diagnosis not present

## 2015-10-02 DIAGNOSIS — D509 Iron deficiency anemia, unspecified: Secondary | ICD-10-CM | POA: Diagnosis not present

## 2015-10-02 DIAGNOSIS — E440 Moderate protein-calorie malnutrition: Secondary | ICD-10-CM | POA: Diagnosis not present

## 2015-10-02 DIAGNOSIS — K769 Liver disease, unspecified: Secondary | ICD-10-CM | POA: Diagnosis not present

## 2015-10-03 DIAGNOSIS — D509 Iron deficiency anemia, unspecified: Secondary | ICD-10-CM | POA: Diagnosis not present

## 2015-10-03 DIAGNOSIS — E440 Moderate protein-calorie malnutrition: Secondary | ICD-10-CM | POA: Diagnosis not present

## 2015-10-03 DIAGNOSIS — N2581 Secondary hyperparathyroidism of renal origin: Secondary | ICD-10-CM | POA: Diagnosis not present

## 2015-10-03 DIAGNOSIS — N186 End stage renal disease: Secondary | ICD-10-CM | POA: Diagnosis not present

## 2015-10-03 DIAGNOSIS — K769 Liver disease, unspecified: Secondary | ICD-10-CM | POA: Diagnosis not present

## 2015-10-03 DIAGNOSIS — D631 Anemia in chronic kidney disease: Secondary | ICD-10-CM | POA: Diagnosis not present

## 2015-10-04 DIAGNOSIS — E440 Moderate protein-calorie malnutrition: Secondary | ICD-10-CM | POA: Diagnosis not present

## 2015-10-04 DIAGNOSIS — K769 Liver disease, unspecified: Secondary | ICD-10-CM | POA: Diagnosis not present

## 2015-10-04 DIAGNOSIS — N2581 Secondary hyperparathyroidism of renal origin: Secondary | ICD-10-CM | POA: Diagnosis not present

## 2015-10-04 DIAGNOSIS — N186 End stage renal disease: Secondary | ICD-10-CM | POA: Diagnosis not present

## 2015-10-04 DIAGNOSIS — D631 Anemia in chronic kidney disease: Secondary | ICD-10-CM | POA: Diagnosis not present

## 2015-10-04 DIAGNOSIS — D509 Iron deficiency anemia, unspecified: Secondary | ICD-10-CM | POA: Diagnosis not present

## 2015-10-05 DIAGNOSIS — E440 Moderate protein-calorie malnutrition: Secondary | ICD-10-CM | POA: Diagnosis not present

## 2015-10-05 DIAGNOSIS — N2581 Secondary hyperparathyroidism of renal origin: Secondary | ICD-10-CM | POA: Diagnosis not present

## 2015-10-05 DIAGNOSIS — D631 Anemia in chronic kidney disease: Secondary | ICD-10-CM | POA: Diagnosis not present

## 2015-10-05 DIAGNOSIS — K769 Liver disease, unspecified: Secondary | ICD-10-CM | POA: Diagnosis not present

## 2015-10-05 DIAGNOSIS — N186 End stage renal disease: Secondary | ICD-10-CM | POA: Diagnosis not present

## 2015-10-05 DIAGNOSIS — D509 Iron deficiency anemia, unspecified: Secondary | ICD-10-CM | POA: Diagnosis not present

## 2015-10-06 DIAGNOSIS — N186 End stage renal disease: Secondary | ICD-10-CM | POA: Diagnosis not present

## 2015-10-06 DIAGNOSIS — D631 Anemia in chronic kidney disease: Secondary | ICD-10-CM | POA: Diagnosis not present

## 2015-10-06 DIAGNOSIS — K769 Liver disease, unspecified: Secondary | ICD-10-CM | POA: Diagnosis not present

## 2015-10-06 DIAGNOSIS — E440 Moderate protein-calorie malnutrition: Secondary | ICD-10-CM | POA: Diagnosis not present

## 2015-10-06 DIAGNOSIS — D509 Iron deficiency anemia, unspecified: Secondary | ICD-10-CM | POA: Diagnosis not present

## 2015-10-06 DIAGNOSIS — N2581 Secondary hyperparathyroidism of renal origin: Secondary | ICD-10-CM | POA: Diagnosis not present

## 2015-10-07 DIAGNOSIS — D509 Iron deficiency anemia, unspecified: Secondary | ICD-10-CM | POA: Diagnosis not present

## 2015-10-07 DIAGNOSIS — D631 Anemia in chronic kidney disease: Secondary | ICD-10-CM | POA: Diagnosis not present

## 2015-10-07 DIAGNOSIS — K769 Liver disease, unspecified: Secondary | ICD-10-CM | POA: Diagnosis not present

## 2015-10-07 DIAGNOSIS — N186 End stage renal disease: Secondary | ICD-10-CM | POA: Diagnosis not present

## 2015-10-07 DIAGNOSIS — N2581 Secondary hyperparathyroidism of renal origin: Secondary | ICD-10-CM | POA: Diagnosis not present

## 2015-10-07 DIAGNOSIS — E440 Moderate protein-calorie malnutrition: Secondary | ICD-10-CM | POA: Diagnosis not present

## 2015-10-08 DIAGNOSIS — N186 End stage renal disease: Secondary | ICD-10-CM | POA: Diagnosis not present

## 2015-10-08 DIAGNOSIS — K769 Liver disease, unspecified: Secondary | ICD-10-CM | POA: Diagnosis not present

## 2015-10-08 DIAGNOSIS — D509 Iron deficiency anemia, unspecified: Secondary | ICD-10-CM | POA: Diagnosis not present

## 2015-10-08 DIAGNOSIS — E440 Moderate protein-calorie malnutrition: Secondary | ICD-10-CM | POA: Diagnosis not present

## 2015-10-08 DIAGNOSIS — N2581 Secondary hyperparathyroidism of renal origin: Secondary | ICD-10-CM | POA: Diagnosis not present

## 2015-10-08 DIAGNOSIS — D631 Anemia in chronic kidney disease: Secondary | ICD-10-CM | POA: Diagnosis not present

## 2015-10-09 DIAGNOSIS — D509 Iron deficiency anemia, unspecified: Secondary | ICD-10-CM | POA: Diagnosis not present

## 2015-10-09 DIAGNOSIS — K769 Liver disease, unspecified: Secondary | ICD-10-CM | POA: Diagnosis not present

## 2015-10-09 DIAGNOSIS — N2581 Secondary hyperparathyroidism of renal origin: Secondary | ICD-10-CM | POA: Diagnosis not present

## 2015-10-09 DIAGNOSIS — N186 End stage renal disease: Secondary | ICD-10-CM | POA: Diagnosis not present

## 2015-10-09 DIAGNOSIS — D631 Anemia in chronic kidney disease: Secondary | ICD-10-CM | POA: Diagnosis not present

## 2015-10-09 DIAGNOSIS — E440 Moderate protein-calorie malnutrition: Secondary | ICD-10-CM | POA: Diagnosis not present

## 2015-10-10 DIAGNOSIS — D509 Iron deficiency anemia, unspecified: Secondary | ICD-10-CM | POA: Diagnosis not present

## 2015-10-10 DIAGNOSIS — N2581 Secondary hyperparathyroidism of renal origin: Secondary | ICD-10-CM | POA: Diagnosis not present

## 2015-10-10 DIAGNOSIS — K769 Liver disease, unspecified: Secondary | ICD-10-CM | POA: Diagnosis not present

## 2015-10-10 DIAGNOSIS — D631 Anemia in chronic kidney disease: Secondary | ICD-10-CM | POA: Diagnosis not present

## 2015-10-10 DIAGNOSIS — N186 End stage renal disease: Secondary | ICD-10-CM | POA: Diagnosis not present

## 2015-10-10 DIAGNOSIS — E440 Moderate protein-calorie malnutrition: Secondary | ICD-10-CM | POA: Diagnosis not present

## 2015-10-11 DIAGNOSIS — D631 Anemia in chronic kidney disease: Secondary | ICD-10-CM | POA: Diagnosis not present

## 2015-10-11 DIAGNOSIS — E440 Moderate protein-calorie malnutrition: Secondary | ICD-10-CM | POA: Diagnosis not present

## 2015-10-11 DIAGNOSIS — N186 End stage renal disease: Secondary | ICD-10-CM | POA: Diagnosis not present

## 2015-10-11 DIAGNOSIS — N2581 Secondary hyperparathyroidism of renal origin: Secondary | ICD-10-CM | POA: Diagnosis not present

## 2015-10-11 DIAGNOSIS — K769 Liver disease, unspecified: Secondary | ICD-10-CM | POA: Diagnosis not present

## 2015-10-11 DIAGNOSIS — D509 Iron deficiency anemia, unspecified: Secondary | ICD-10-CM | POA: Diagnosis not present

## 2015-10-12 DIAGNOSIS — D509 Iron deficiency anemia, unspecified: Secondary | ICD-10-CM | POA: Diagnosis not present

## 2015-10-12 DIAGNOSIS — N2581 Secondary hyperparathyroidism of renal origin: Secondary | ICD-10-CM | POA: Diagnosis not present

## 2015-10-12 DIAGNOSIS — E440 Moderate protein-calorie malnutrition: Secondary | ICD-10-CM | POA: Diagnosis not present

## 2015-10-12 DIAGNOSIS — N186 End stage renal disease: Secondary | ICD-10-CM | POA: Diagnosis not present

## 2015-10-12 DIAGNOSIS — K769 Liver disease, unspecified: Secondary | ICD-10-CM | POA: Diagnosis not present

## 2015-10-12 DIAGNOSIS — D631 Anemia in chronic kidney disease: Secondary | ICD-10-CM | POA: Diagnosis not present

## 2015-10-13 DIAGNOSIS — K769 Liver disease, unspecified: Secondary | ICD-10-CM | POA: Diagnosis not present

## 2015-10-13 DIAGNOSIS — D631 Anemia in chronic kidney disease: Secondary | ICD-10-CM | POA: Diagnosis not present

## 2015-10-13 DIAGNOSIS — D509 Iron deficiency anemia, unspecified: Secondary | ICD-10-CM | POA: Diagnosis not present

## 2015-10-13 DIAGNOSIS — N2581 Secondary hyperparathyroidism of renal origin: Secondary | ICD-10-CM | POA: Diagnosis not present

## 2015-10-13 DIAGNOSIS — E440 Moderate protein-calorie malnutrition: Secondary | ICD-10-CM | POA: Diagnosis not present

## 2015-10-13 DIAGNOSIS — N186 End stage renal disease: Secondary | ICD-10-CM | POA: Diagnosis not present

## 2015-10-14 DIAGNOSIS — D631 Anemia in chronic kidney disease: Secondary | ICD-10-CM | POA: Diagnosis not present

## 2015-10-14 DIAGNOSIS — E440 Moderate protein-calorie malnutrition: Secondary | ICD-10-CM | POA: Diagnosis not present

## 2015-10-14 DIAGNOSIS — N2581 Secondary hyperparathyroidism of renal origin: Secondary | ICD-10-CM | POA: Diagnosis not present

## 2015-10-14 DIAGNOSIS — K769 Liver disease, unspecified: Secondary | ICD-10-CM | POA: Diagnosis not present

## 2015-10-14 DIAGNOSIS — D509 Iron deficiency anemia, unspecified: Secondary | ICD-10-CM | POA: Diagnosis not present

## 2015-10-14 DIAGNOSIS — N186 End stage renal disease: Secondary | ICD-10-CM | POA: Diagnosis not present

## 2015-10-15 DIAGNOSIS — E440 Moderate protein-calorie malnutrition: Secondary | ICD-10-CM | POA: Diagnosis not present

## 2015-10-15 DIAGNOSIS — N2581 Secondary hyperparathyroidism of renal origin: Secondary | ICD-10-CM | POA: Diagnosis not present

## 2015-10-15 DIAGNOSIS — D631 Anemia in chronic kidney disease: Secondary | ICD-10-CM | POA: Diagnosis not present

## 2015-10-15 DIAGNOSIS — K769 Liver disease, unspecified: Secondary | ICD-10-CM | POA: Diagnosis not present

## 2015-10-15 DIAGNOSIS — D509 Iron deficiency anemia, unspecified: Secondary | ICD-10-CM | POA: Diagnosis not present

## 2015-10-15 DIAGNOSIS — N186 End stage renal disease: Secondary | ICD-10-CM | POA: Diagnosis not present

## 2015-10-16 DIAGNOSIS — N186 End stage renal disease: Secondary | ICD-10-CM | POA: Diagnosis not present

## 2015-10-16 DIAGNOSIS — D509 Iron deficiency anemia, unspecified: Secondary | ICD-10-CM | POA: Diagnosis not present

## 2015-10-16 DIAGNOSIS — E440 Moderate protein-calorie malnutrition: Secondary | ICD-10-CM | POA: Diagnosis not present

## 2015-10-16 DIAGNOSIS — K769 Liver disease, unspecified: Secondary | ICD-10-CM | POA: Diagnosis not present

## 2015-10-16 DIAGNOSIS — D631 Anemia in chronic kidney disease: Secondary | ICD-10-CM | POA: Diagnosis not present

## 2015-10-16 DIAGNOSIS — N2581 Secondary hyperparathyroidism of renal origin: Secondary | ICD-10-CM | POA: Diagnosis not present

## 2015-10-17 DIAGNOSIS — N186 End stage renal disease: Secondary | ICD-10-CM | POA: Diagnosis not present

## 2015-10-17 DIAGNOSIS — K769 Liver disease, unspecified: Secondary | ICD-10-CM | POA: Diagnosis not present

## 2015-10-17 DIAGNOSIS — N2581 Secondary hyperparathyroidism of renal origin: Secondary | ICD-10-CM | POA: Diagnosis not present

## 2015-10-17 DIAGNOSIS — D631 Anemia in chronic kidney disease: Secondary | ICD-10-CM | POA: Diagnosis not present

## 2015-10-17 DIAGNOSIS — D509 Iron deficiency anemia, unspecified: Secondary | ICD-10-CM | POA: Diagnosis not present

## 2015-10-17 DIAGNOSIS — E440 Moderate protein-calorie malnutrition: Secondary | ICD-10-CM | POA: Diagnosis not present

## 2015-10-18 DIAGNOSIS — K769 Liver disease, unspecified: Secondary | ICD-10-CM | POA: Diagnosis not present

## 2015-10-18 DIAGNOSIS — E440 Moderate protein-calorie malnutrition: Secondary | ICD-10-CM | POA: Diagnosis not present

## 2015-10-18 DIAGNOSIS — D631 Anemia in chronic kidney disease: Secondary | ICD-10-CM | POA: Diagnosis not present

## 2015-10-18 DIAGNOSIS — N2581 Secondary hyperparathyroidism of renal origin: Secondary | ICD-10-CM | POA: Diagnosis not present

## 2015-10-18 DIAGNOSIS — D509 Iron deficiency anemia, unspecified: Secondary | ICD-10-CM | POA: Diagnosis not present

## 2015-10-18 DIAGNOSIS — N186 End stage renal disease: Secondary | ICD-10-CM | POA: Diagnosis not present

## 2015-10-19 DIAGNOSIS — D509 Iron deficiency anemia, unspecified: Secondary | ICD-10-CM | POA: Diagnosis not present

## 2015-10-19 DIAGNOSIS — N2581 Secondary hyperparathyroidism of renal origin: Secondary | ICD-10-CM | POA: Diagnosis not present

## 2015-10-19 DIAGNOSIS — K769 Liver disease, unspecified: Secondary | ICD-10-CM | POA: Diagnosis not present

## 2015-10-19 DIAGNOSIS — N186 End stage renal disease: Secondary | ICD-10-CM | POA: Diagnosis not present

## 2015-10-19 DIAGNOSIS — D631 Anemia in chronic kidney disease: Secondary | ICD-10-CM | POA: Diagnosis not present

## 2015-10-19 DIAGNOSIS — E440 Moderate protein-calorie malnutrition: Secondary | ICD-10-CM | POA: Diagnosis not present

## 2015-10-20 DIAGNOSIS — K769 Liver disease, unspecified: Secondary | ICD-10-CM | POA: Diagnosis not present

## 2015-10-20 DIAGNOSIS — E440 Moderate protein-calorie malnutrition: Secondary | ICD-10-CM | POA: Diagnosis not present

## 2015-10-20 DIAGNOSIS — D631 Anemia in chronic kidney disease: Secondary | ICD-10-CM | POA: Diagnosis not present

## 2015-10-20 DIAGNOSIS — N2581 Secondary hyperparathyroidism of renal origin: Secondary | ICD-10-CM | POA: Diagnosis not present

## 2015-10-20 DIAGNOSIS — D509 Iron deficiency anemia, unspecified: Secondary | ICD-10-CM | POA: Diagnosis not present

## 2015-10-20 DIAGNOSIS — N186 End stage renal disease: Secondary | ICD-10-CM | POA: Diagnosis not present

## 2015-10-21 DIAGNOSIS — K769 Liver disease, unspecified: Secondary | ICD-10-CM | POA: Diagnosis not present

## 2015-10-21 DIAGNOSIS — N2581 Secondary hyperparathyroidism of renal origin: Secondary | ICD-10-CM | POA: Diagnosis not present

## 2015-10-21 DIAGNOSIS — D509 Iron deficiency anemia, unspecified: Secondary | ICD-10-CM | POA: Diagnosis not present

## 2015-10-21 DIAGNOSIS — N186 End stage renal disease: Secondary | ICD-10-CM | POA: Diagnosis not present

## 2015-10-21 DIAGNOSIS — E440 Moderate protein-calorie malnutrition: Secondary | ICD-10-CM | POA: Diagnosis not present

## 2015-10-21 DIAGNOSIS — D631 Anemia in chronic kidney disease: Secondary | ICD-10-CM | POA: Diagnosis not present

## 2015-10-22 DIAGNOSIS — N2581 Secondary hyperparathyroidism of renal origin: Secondary | ICD-10-CM | POA: Diagnosis not present

## 2015-10-22 DIAGNOSIS — E440 Moderate protein-calorie malnutrition: Secondary | ICD-10-CM | POA: Diagnosis not present

## 2015-10-22 DIAGNOSIS — D509 Iron deficiency anemia, unspecified: Secondary | ICD-10-CM | POA: Diagnosis not present

## 2015-10-22 DIAGNOSIS — N186 End stage renal disease: Secondary | ICD-10-CM | POA: Diagnosis not present

## 2015-10-22 DIAGNOSIS — K769 Liver disease, unspecified: Secondary | ICD-10-CM | POA: Diagnosis not present

## 2015-10-22 DIAGNOSIS — D631 Anemia in chronic kidney disease: Secondary | ICD-10-CM | POA: Diagnosis not present

## 2015-10-23 DIAGNOSIS — N186 End stage renal disease: Secondary | ICD-10-CM | POA: Diagnosis not present

## 2015-10-23 DIAGNOSIS — K769 Liver disease, unspecified: Secondary | ICD-10-CM | POA: Diagnosis not present

## 2015-10-23 DIAGNOSIS — N2581 Secondary hyperparathyroidism of renal origin: Secondary | ICD-10-CM | POA: Diagnosis not present

## 2015-10-23 DIAGNOSIS — E440 Moderate protein-calorie malnutrition: Secondary | ICD-10-CM | POA: Diagnosis not present

## 2015-10-23 DIAGNOSIS — D631 Anemia in chronic kidney disease: Secondary | ICD-10-CM | POA: Diagnosis not present

## 2015-10-23 DIAGNOSIS — D509 Iron deficiency anemia, unspecified: Secondary | ICD-10-CM | POA: Diagnosis not present

## 2015-10-24 DIAGNOSIS — K769 Liver disease, unspecified: Secondary | ICD-10-CM | POA: Diagnosis not present

## 2015-10-24 DIAGNOSIS — N186 End stage renal disease: Secondary | ICD-10-CM | POA: Diagnosis not present

## 2015-10-24 DIAGNOSIS — E440 Moderate protein-calorie malnutrition: Secondary | ICD-10-CM | POA: Diagnosis not present

## 2015-10-24 DIAGNOSIS — D631 Anemia in chronic kidney disease: Secondary | ICD-10-CM | POA: Diagnosis not present

## 2015-10-24 DIAGNOSIS — N2581 Secondary hyperparathyroidism of renal origin: Secondary | ICD-10-CM | POA: Diagnosis not present

## 2015-10-24 DIAGNOSIS — D509 Iron deficiency anemia, unspecified: Secondary | ICD-10-CM | POA: Diagnosis not present

## 2015-10-25 DIAGNOSIS — E440 Moderate protein-calorie malnutrition: Secondary | ICD-10-CM | POA: Diagnosis not present

## 2015-10-25 DIAGNOSIS — K769 Liver disease, unspecified: Secondary | ICD-10-CM | POA: Diagnosis not present

## 2015-10-25 DIAGNOSIS — N2581 Secondary hyperparathyroidism of renal origin: Secondary | ICD-10-CM | POA: Diagnosis not present

## 2015-10-25 DIAGNOSIS — D631 Anemia in chronic kidney disease: Secondary | ICD-10-CM | POA: Diagnosis not present

## 2015-10-25 DIAGNOSIS — D509 Iron deficiency anemia, unspecified: Secondary | ICD-10-CM | POA: Diagnosis not present

## 2015-10-25 DIAGNOSIS — N186 End stage renal disease: Secondary | ICD-10-CM | POA: Diagnosis not present

## 2015-10-26 DIAGNOSIS — E440 Moderate protein-calorie malnutrition: Secondary | ICD-10-CM | POA: Diagnosis not present

## 2015-10-26 DIAGNOSIS — D631 Anemia in chronic kidney disease: Secondary | ICD-10-CM | POA: Diagnosis not present

## 2015-10-26 DIAGNOSIS — K769 Liver disease, unspecified: Secondary | ICD-10-CM | POA: Diagnosis not present

## 2015-10-26 DIAGNOSIS — D509 Iron deficiency anemia, unspecified: Secondary | ICD-10-CM | POA: Diagnosis not present

## 2015-10-26 DIAGNOSIS — N186 End stage renal disease: Secondary | ICD-10-CM | POA: Diagnosis not present

## 2015-10-26 DIAGNOSIS — N2581 Secondary hyperparathyroidism of renal origin: Secondary | ICD-10-CM | POA: Diagnosis not present

## 2015-10-27 DIAGNOSIS — N186 End stage renal disease: Secondary | ICD-10-CM | POA: Diagnosis not present

## 2015-10-27 DIAGNOSIS — E440 Moderate protein-calorie malnutrition: Secondary | ICD-10-CM | POA: Diagnosis not present

## 2015-10-27 DIAGNOSIS — N2581 Secondary hyperparathyroidism of renal origin: Secondary | ICD-10-CM | POA: Diagnosis not present

## 2015-10-27 DIAGNOSIS — D509 Iron deficiency anemia, unspecified: Secondary | ICD-10-CM | POA: Diagnosis not present

## 2015-10-27 DIAGNOSIS — D631 Anemia in chronic kidney disease: Secondary | ICD-10-CM | POA: Diagnosis not present

## 2015-10-27 DIAGNOSIS — K769 Liver disease, unspecified: Secondary | ICD-10-CM | POA: Diagnosis not present

## 2015-10-28 DIAGNOSIS — K769 Liver disease, unspecified: Secondary | ICD-10-CM | POA: Diagnosis not present

## 2015-10-28 DIAGNOSIS — D509 Iron deficiency anemia, unspecified: Secondary | ICD-10-CM | POA: Diagnosis not present

## 2015-10-28 DIAGNOSIS — N2581 Secondary hyperparathyroidism of renal origin: Secondary | ICD-10-CM | POA: Diagnosis not present

## 2015-10-28 DIAGNOSIS — Z5189 Encounter for other specified aftercare: Secondary | ICD-10-CM | POA: Diagnosis not present

## 2015-10-28 DIAGNOSIS — E440 Moderate protein-calorie malnutrition: Secondary | ICD-10-CM | POA: Diagnosis not present

## 2015-10-28 DIAGNOSIS — N186 End stage renal disease: Secondary | ICD-10-CM | POA: Diagnosis not present

## 2015-10-28 DIAGNOSIS — Z79899 Other long term (current) drug therapy: Secondary | ICD-10-CM | POA: Diagnosis not present

## 2015-10-28 DIAGNOSIS — D631 Anemia in chronic kidney disease: Secondary | ICD-10-CM | POA: Diagnosis not present

## 2015-10-29 DIAGNOSIS — D631 Anemia in chronic kidney disease: Secondary | ICD-10-CM | POA: Diagnosis not present

## 2015-10-29 DIAGNOSIS — Z79899 Other long term (current) drug therapy: Secondary | ICD-10-CM | POA: Diagnosis not present

## 2015-10-29 DIAGNOSIS — R8299 Other abnormal findings in urine: Secondary | ICD-10-CM | POA: Diagnosis not present

## 2015-10-29 DIAGNOSIS — Z5189 Encounter for other specified aftercare: Secondary | ICD-10-CM | POA: Diagnosis not present

## 2015-10-29 DIAGNOSIS — D509 Iron deficiency anemia, unspecified: Secondary | ICD-10-CM | POA: Diagnosis not present

## 2015-10-29 DIAGNOSIS — N2581 Secondary hyperparathyroidism of renal origin: Secondary | ICD-10-CM | POA: Diagnosis not present

## 2015-10-29 DIAGNOSIS — N186 End stage renal disease: Secondary | ICD-10-CM | POA: Diagnosis not present

## 2015-10-30 DIAGNOSIS — Z79899 Other long term (current) drug therapy: Secondary | ICD-10-CM | POA: Diagnosis not present

## 2015-10-30 DIAGNOSIS — D509 Iron deficiency anemia, unspecified: Secondary | ICD-10-CM | POA: Diagnosis not present

## 2015-10-30 DIAGNOSIS — N2581 Secondary hyperparathyroidism of renal origin: Secondary | ICD-10-CM | POA: Diagnosis not present

## 2015-10-30 DIAGNOSIS — D631 Anemia in chronic kidney disease: Secondary | ICD-10-CM | POA: Diagnosis not present

## 2015-10-30 DIAGNOSIS — N186 End stage renal disease: Secondary | ICD-10-CM | POA: Diagnosis not present

## 2015-10-30 DIAGNOSIS — Z5189 Encounter for other specified aftercare: Secondary | ICD-10-CM | POA: Diagnosis not present

## 2015-10-31 DIAGNOSIS — N186 End stage renal disease: Secondary | ICD-10-CM | POA: Diagnosis not present

## 2015-10-31 DIAGNOSIS — Z5189 Encounter for other specified aftercare: Secondary | ICD-10-CM | POA: Diagnosis not present

## 2015-10-31 DIAGNOSIS — D509 Iron deficiency anemia, unspecified: Secondary | ICD-10-CM | POA: Diagnosis not present

## 2015-10-31 DIAGNOSIS — N2581 Secondary hyperparathyroidism of renal origin: Secondary | ICD-10-CM | POA: Diagnosis not present

## 2015-10-31 DIAGNOSIS — D631 Anemia in chronic kidney disease: Secondary | ICD-10-CM | POA: Diagnosis not present

## 2015-10-31 DIAGNOSIS — Z79899 Other long term (current) drug therapy: Secondary | ICD-10-CM | POA: Diagnosis not present

## 2015-11-01 DIAGNOSIS — N2581 Secondary hyperparathyroidism of renal origin: Secondary | ICD-10-CM | POA: Diagnosis not present

## 2015-11-01 DIAGNOSIS — Z5189 Encounter for other specified aftercare: Secondary | ICD-10-CM | POA: Diagnosis not present

## 2015-11-01 DIAGNOSIS — N186 End stage renal disease: Secondary | ICD-10-CM | POA: Diagnosis not present

## 2015-11-01 DIAGNOSIS — Z79899 Other long term (current) drug therapy: Secondary | ICD-10-CM | POA: Diagnosis not present

## 2015-11-01 DIAGNOSIS — D631 Anemia in chronic kidney disease: Secondary | ICD-10-CM | POA: Diagnosis not present

## 2015-11-01 DIAGNOSIS — D509 Iron deficiency anemia, unspecified: Secondary | ICD-10-CM | POA: Diagnosis not present

## 2015-11-02 DIAGNOSIS — D631 Anemia in chronic kidney disease: Secondary | ICD-10-CM | POA: Diagnosis not present

## 2015-11-02 DIAGNOSIS — N186 End stage renal disease: Secondary | ICD-10-CM | POA: Diagnosis not present

## 2015-11-02 DIAGNOSIS — Z79899 Other long term (current) drug therapy: Secondary | ICD-10-CM | POA: Diagnosis not present

## 2015-11-02 DIAGNOSIS — Z5189 Encounter for other specified aftercare: Secondary | ICD-10-CM | POA: Diagnosis not present

## 2015-11-02 DIAGNOSIS — D509 Iron deficiency anemia, unspecified: Secondary | ICD-10-CM | POA: Diagnosis not present

## 2015-11-02 DIAGNOSIS — N2581 Secondary hyperparathyroidism of renal origin: Secondary | ICD-10-CM | POA: Diagnosis not present

## 2015-11-03 DIAGNOSIS — N2581 Secondary hyperparathyroidism of renal origin: Secondary | ICD-10-CM | POA: Diagnosis not present

## 2015-11-03 DIAGNOSIS — Z5189 Encounter for other specified aftercare: Secondary | ICD-10-CM | POA: Diagnosis not present

## 2015-11-03 DIAGNOSIS — N186 End stage renal disease: Secondary | ICD-10-CM | POA: Diagnosis not present

## 2015-11-03 DIAGNOSIS — Z79899 Other long term (current) drug therapy: Secondary | ICD-10-CM | POA: Diagnosis not present

## 2015-11-03 DIAGNOSIS — D509 Iron deficiency anemia, unspecified: Secondary | ICD-10-CM | POA: Diagnosis not present

## 2015-11-03 DIAGNOSIS — D631 Anemia in chronic kidney disease: Secondary | ICD-10-CM | POA: Diagnosis not present

## 2015-11-04 DIAGNOSIS — N186 End stage renal disease: Secondary | ICD-10-CM | POA: Diagnosis not present

## 2015-11-04 DIAGNOSIS — D509 Iron deficiency anemia, unspecified: Secondary | ICD-10-CM | POA: Diagnosis not present

## 2015-11-04 DIAGNOSIS — N2581 Secondary hyperparathyroidism of renal origin: Secondary | ICD-10-CM | POA: Diagnosis not present

## 2015-11-04 DIAGNOSIS — Z79899 Other long term (current) drug therapy: Secondary | ICD-10-CM | POA: Diagnosis not present

## 2015-11-04 DIAGNOSIS — D631 Anemia in chronic kidney disease: Secondary | ICD-10-CM | POA: Diagnosis not present

## 2015-11-04 DIAGNOSIS — Z5189 Encounter for other specified aftercare: Secondary | ICD-10-CM | POA: Diagnosis not present

## 2015-11-05 DIAGNOSIS — D631 Anemia in chronic kidney disease: Secondary | ICD-10-CM | POA: Diagnosis not present

## 2015-11-05 DIAGNOSIS — N2581 Secondary hyperparathyroidism of renal origin: Secondary | ICD-10-CM | POA: Diagnosis not present

## 2015-11-05 DIAGNOSIS — N186 End stage renal disease: Secondary | ICD-10-CM | POA: Diagnosis not present

## 2015-11-05 DIAGNOSIS — Z5189 Encounter for other specified aftercare: Secondary | ICD-10-CM | POA: Diagnosis not present

## 2015-11-05 DIAGNOSIS — Z79899 Other long term (current) drug therapy: Secondary | ICD-10-CM | POA: Diagnosis not present

## 2015-11-05 DIAGNOSIS — D509 Iron deficiency anemia, unspecified: Secondary | ICD-10-CM | POA: Diagnosis not present

## 2015-11-06 DIAGNOSIS — N2581 Secondary hyperparathyroidism of renal origin: Secondary | ICD-10-CM | POA: Diagnosis not present

## 2015-11-06 DIAGNOSIS — N186 End stage renal disease: Secondary | ICD-10-CM | POA: Diagnosis not present

## 2015-11-06 DIAGNOSIS — Z79899 Other long term (current) drug therapy: Secondary | ICD-10-CM | POA: Diagnosis not present

## 2015-11-06 DIAGNOSIS — Z5189 Encounter for other specified aftercare: Secondary | ICD-10-CM | POA: Diagnosis not present

## 2015-11-06 DIAGNOSIS — D509 Iron deficiency anemia, unspecified: Secondary | ICD-10-CM | POA: Diagnosis not present

## 2015-11-06 DIAGNOSIS — D631 Anemia in chronic kidney disease: Secondary | ICD-10-CM | POA: Diagnosis not present

## 2015-11-07 DIAGNOSIS — D509 Iron deficiency anemia, unspecified: Secondary | ICD-10-CM | POA: Diagnosis not present

## 2015-11-07 DIAGNOSIS — D631 Anemia in chronic kidney disease: Secondary | ICD-10-CM | POA: Diagnosis not present

## 2015-11-07 DIAGNOSIS — N2581 Secondary hyperparathyroidism of renal origin: Secondary | ICD-10-CM | POA: Diagnosis not present

## 2015-11-07 DIAGNOSIS — N186 End stage renal disease: Secondary | ICD-10-CM | POA: Diagnosis not present

## 2015-11-07 DIAGNOSIS — Z79899 Other long term (current) drug therapy: Secondary | ICD-10-CM | POA: Diagnosis not present

## 2015-11-07 DIAGNOSIS — Z5189 Encounter for other specified aftercare: Secondary | ICD-10-CM | POA: Diagnosis not present

## 2015-11-08 DIAGNOSIS — Z79899 Other long term (current) drug therapy: Secondary | ICD-10-CM | POA: Diagnosis not present

## 2015-11-08 DIAGNOSIS — Z5189 Encounter for other specified aftercare: Secondary | ICD-10-CM | POA: Diagnosis not present

## 2015-11-08 DIAGNOSIS — N186 End stage renal disease: Secondary | ICD-10-CM | POA: Diagnosis not present

## 2015-11-08 DIAGNOSIS — D509 Iron deficiency anemia, unspecified: Secondary | ICD-10-CM | POA: Diagnosis not present

## 2015-11-08 DIAGNOSIS — D631 Anemia in chronic kidney disease: Secondary | ICD-10-CM | POA: Diagnosis not present

## 2015-11-08 DIAGNOSIS — N2581 Secondary hyperparathyroidism of renal origin: Secondary | ICD-10-CM | POA: Diagnosis not present

## 2015-11-09 DIAGNOSIS — D631 Anemia in chronic kidney disease: Secondary | ICD-10-CM | POA: Diagnosis not present

## 2015-11-09 DIAGNOSIS — D509 Iron deficiency anemia, unspecified: Secondary | ICD-10-CM | POA: Diagnosis not present

## 2015-11-09 DIAGNOSIS — N2581 Secondary hyperparathyroidism of renal origin: Secondary | ICD-10-CM | POA: Diagnosis not present

## 2015-11-09 DIAGNOSIS — Z79899 Other long term (current) drug therapy: Secondary | ICD-10-CM | POA: Diagnosis not present

## 2015-11-09 DIAGNOSIS — N186 End stage renal disease: Secondary | ICD-10-CM | POA: Diagnosis not present

## 2015-11-09 DIAGNOSIS — Z5189 Encounter for other specified aftercare: Secondary | ICD-10-CM | POA: Diagnosis not present

## 2015-11-10 DIAGNOSIS — D509 Iron deficiency anemia, unspecified: Secondary | ICD-10-CM | POA: Diagnosis not present

## 2015-11-10 DIAGNOSIS — N2581 Secondary hyperparathyroidism of renal origin: Secondary | ICD-10-CM | POA: Diagnosis not present

## 2015-11-10 DIAGNOSIS — Z79899 Other long term (current) drug therapy: Secondary | ICD-10-CM | POA: Diagnosis not present

## 2015-11-10 DIAGNOSIS — N186 End stage renal disease: Secondary | ICD-10-CM | POA: Diagnosis not present

## 2015-11-10 DIAGNOSIS — D631 Anemia in chronic kidney disease: Secondary | ICD-10-CM | POA: Diagnosis not present

## 2015-11-10 DIAGNOSIS — Z5189 Encounter for other specified aftercare: Secondary | ICD-10-CM | POA: Diagnosis not present

## 2015-11-11 DIAGNOSIS — D631 Anemia in chronic kidney disease: Secondary | ICD-10-CM | POA: Diagnosis not present

## 2015-11-11 DIAGNOSIS — D509 Iron deficiency anemia, unspecified: Secondary | ICD-10-CM | POA: Diagnosis not present

## 2015-11-11 DIAGNOSIS — N186 End stage renal disease: Secondary | ICD-10-CM | POA: Diagnosis not present

## 2015-11-11 DIAGNOSIS — N2581 Secondary hyperparathyroidism of renal origin: Secondary | ICD-10-CM | POA: Diagnosis not present

## 2015-11-11 DIAGNOSIS — Z79899 Other long term (current) drug therapy: Secondary | ICD-10-CM | POA: Diagnosis not present

## 2015-11-11 DIAGNOSIS — Z5189 Encounter for other specified aftercare: Secondary | ICD-10-CM | POA: Diagnosis not present

## 2015-11-12 DIAGNOSIS — N2581 Secondary hyperparathyroidism of renal origin: Secondary | ICD-10-CM | POA: Diagnosis not present

## 2015-11-12 DIAGNOSIS — D509 Iron deficiency anemia, unspecified: Secondary | ICD-10-CM | POA: Diagnosis not present

## 2015-11-12 DIAGNOSIS — Z5189 Encounter for other specified aftercare: Secondary | ICD-10-CM | POA: Diagnosis not present

## 2015-11-12 DIAGNOSIS — Z79899 Other long term (current) drug therapy: Secondary | ICD-10-CM | POA: Diagnosis not present

## 2015-11-12 DIAGNOSIS — N186 End stage renal disease: Secondary | ICD-10-CM | POA: Diagnosis not present

## 2015-11-12 DIAGNOSIS — D631 Anemia in chronic kidney disease: Secondary | ICD-10-CM | POA: Diagnosis not present

## 2015-11-13 DIAGNOSIS — N2581 Secondary hyperparathyroidism of renal origin: Secondary | ICD-10-CM | POA: Diagnosis not present

## 2015-11-13 DIAGNOSIS — D509 Iron deficiency anemia, unspecified: Secondary | ICD-10-CM | POA: Diagnosis not present

## 2015-11-13 DIAGNOSIS — N186 End stage renal disease: Secondary | ICD-10-CM | POA: Diagnosis not present

## 2015-11-13 DIAGNOSIS — Z79899 Other long term (current) drug therapy: Secondary | ICD-10-CM | POA: Diagnosis not present

## 2015-11-13 DIAGNOSIS — Z5189 Encounter for other specified aftercare: Secondary | ICD-10-CM | POA: Diagnosis not present

## 2015-11-13 DIAGNOSIS — D631 Anemia in chronic kidney disease: Secondary | ICD-10-CM | POA: Diagnosis not present

## 2015-11-14 DIAGNOSIS — Z79899 Other long term (current) drug therapy: Secondary | ICD-10-CM | POA: Diagnosis not present

## 2015-11-14 DIAGNOSIS — N2581 Secondary hyperparathyroidism of renal origin: Secondary | ICD-10-CM | POA: Diagnosis not present

## 2015-11-14 DIAGNOSIS — D631 Anemia in chronic kidney disease: Secondary | ICD-10-CM | POA: Diagnosis not present

## 2015-11-14 DIAGNOSIS — N186 End stage renal disease: Secondary | ICD-10-CM | POA: Diagnosis not present

## 2015-11-14 DIAGNOSIS — D509 Iron deficiency anemia, unspecified: Secondary | ICD-10-CM | POA: Diagnosis not present

## 2015-11-14 DIAGNOSIS — Z5189 Encounter for other specified aftercare: Secondary | ICD-10-CM | POA: Diagnosis not present

## 2015-11-15 DIAGNOSIS — Z5189 Encounter for other specified aftercare: Secondary | ICD-10-CM | POA: Diagnosis not present

## 2015-11-15 DIAGNOSIS — D631 Anemia in chronic kidney disease: Secondary | ICD-10-CM | POA: Diagnosis not present

## 2015-11-15 DIAGNOSIS — Z79899 Other long term (current) drug therapy: Secondary | ICD-10-CM | POA: Diagnosis not present

## 2015-11-15 DIAGNOSIS — N2581 Secondary hyperparathyroidism of renal origin: Secondary | ICD-10-CM | POA: Diagnosis not present

## 2015-11-15 DIAGNOSIS — D509 Iron deficiency anemia, unspecified: Secondary | ICD-10-CM | POA: Diagnosis not present

## 2015-11-15 DIAGNOSIS — N186 End stage renal disease: Secondary | ICD-10-CM | POA: Diagnosis not present

## 2015-11-16 DIAGNOSIS — Z79899 Other long term (current) drug therapy: Secondary | ICD-10-CM | POA: Diagnosis not present

## 2015-11-16 DIAGNOSIS — D509 Iron deficiency anemia, unspecified: Secondary | ICD-10-CM | POA: Diagnosis not present

## 2015-11-16 DIAGNOSIS — N2581 Secondary hyperparathyroidism of renal origin: Secondary | ICD-10-CM | POA: Diagnosis not present

## 2015-11-16 DIAGNOSIS — D631 Anemia in chronic kidney disease: Secondary | ICD-10-CM | POA: Diagnosis not present

## 2015-11-16 DIAGNOSIS — Z5189 Encounter for other specified aftercare: Secondary | ICD-10-CM | POA: Diagnosis not present

## 2015-11-16 DIAGNOSIS — N186 End stage renal disease: Secondary | ICD-10-CM | POA: Diagnosis not present

## 2015-11-17 DIAGNOSIS — D509 Iron deficiency anemia, unspecified: Secondary | ICD-10-CM | POA: Diagnosis not present

## 2015-11-17 DIAGNOSIS — N2581 Secondary hyperparathyroidism of renal origin: Secondary | ICD-10-CM | POA: Diagnosis not present

## 2015-11-17 DIAGNOSIS — N186 End stage renal disease: Secondary | ICD-10-CM | POA: Diagnosis not present

## 2015-11-17 DIAGNOSIS — D631 Anemia in chronic kidney disease: Secondary | ICD-10-CM | POA: Diagnosis not present

## 2015-11-17 DIAGNOSIS — Z79899 Other long term (current) drug therapy: Secondary | ICD-10-CM | POA: Diagnosis not present

## 2015-11-17 DIAGNOSIS — Z5189 Encounter for other specified aftercare: Secondary | ICD-10-CM | POA: Diagnosis not present

## 2015-11-18 DIAGNOSIS — N2581 Secondary hyperparathyroidism of renal origin: Secondary | ICD-10-CM | POA: Diagnosis not present

## 2015-11-18 DIAGNOSIS — D631 Anemia in chronic kidney disease: Secondary | ICD-10-CM | POA: Diagnosis not present

## 2015-11-18 DIAGNOSIS — Z79899 Other long term (current) drug therapy: Secondary | ICD-10-CM | POA: Diagnosis not present

## 2015-11-18 DIAGNOSIS — N186 End stage renal disease: Secondary | ICD-10-CM | POA: Diagnosis not present

## 2015-11-18 DIAGNOSIS — Z5189 Encounter for other specified aftercare: Secondary | ICD-10-CM | POA: Diagnosis not present

## 2015-11-18 DIAGNOSIS — D509 Iron deficiency anemia, unspecified: Secondary | ICD-10-CM | POA: Diagnosis not present

## 2015-11-19 DIAGNOSIS — D509 Iron deficiency anemia, unspecified: Secondary | ICD-10-CM | POA: Diagnosis not present

## 2015-11-19 DIAGNOSIS — D631 Anemia in chronic kidney disease: Secondary | ICD-10-CM | POA: Diagnosis not present

## 2015-11-19 DIAGNOSIS — Z79899 Other long term (current) drug therapy: Secondary | ICD-10-CM | POA: Diagnosis not present

## 2015-11-19 DIAGNOSIS — N2581 Secondary hyperparathyroidism of renal origin: Secondary | ICD-10-CM | POA: Diagnosis not present

## 2015-11-19 DIAGNOSIS — Z5189 Encounter for other specified aftercare: Secondary | ICD-10-CM | POA: Diagnosis not present

## 2015-11-19 DIAGNOSIS — N186 End stage renal disease: Secondary | ICD-10-CM | POA: Diagnosis not present

## 2015-11-20 DIAGNOSIS — Z79899 Other long term (current) drug therapy: Secondary | ICD-10-CM | POA: Diagnosis not present

## 2015-11-20 DIAGNOSIS — Z5189 Encounter for other specified aftercare: Secondary | ICD-10-CM | POA: Diagnosis not present

## 2015-11-20 DIAGNOSIS — N186 End stage renal disease: Secondary | ICD-10-CM | POA: Diagnosis not present

## 2015-11-20 DIAGNOSIS — N2581 Secondary hyperparathyroidism of renal origin: Secondary | ICD-10-CM | POA: Diagnosis not present

## 2015-11-20 DIAGNOSIS — D509 Iron deficiency anemia, unspecified: Secondary | ICD-10-CM | POA: Diagnosis not present

## 2015-11-20 DIAGNOSIS — D631 Anemia in chronic kidney disease: Secondary | ICD-10-CM | POA: Diagnosis not present

## 2015-11-21 DIAGNOSIS — N186 End stage renal disease: Secondary | ICD-10-CM | POA: Diagnosis not present

## 2015-11-21 DIAGNOSIS — D509 Iron deficiency anemia, unspecified: Secondary | ICD-10-CM | POA: Diagnosis not present

## 2015-11-21 DIAGNOSIS — N2581 Secondary hyperparathyroidism of renal origin: Secondary | ICD-10-CM | POA: Diagnosis not present

## 2015-11-21 DIAGNOSIS — D631 Anemia in chronic kidney disease: Secondary | ICD-10-CM | POA: Diagnosis not present

## 2015-11-21 DIAGNOSIS — Z79899 Other long term (current) drug therapy: Secondary | ICD-10-CM | POA: Diagnosis not present

## 2015-11-21 DIAGNOSIS — Z5189 Encounter for other specified aftercare: Secondary | ICD-10-CM | POA: Diagnosis not present

## 2015-11-22 DIAGNOSIS — Z5189 Encounter for other specified aftercare: Secondary | ICD-10-CM | POA: Diagnosis not present

## 2015-11-22 DIAGNOSIS — N186 End stage renal disease: Secondary | ICD-10-CM | POA: Diagnosis not present

## 2015-11-22 DIAGNOSIS — N2581 Secondary hyperparathyroidism of renal origin: Secondary | ICD-10-CM | POA: Diagnosis not present

## 2015-11-22 DIAGNOSIS — D509 Iron deficiency anemia, unspecified: Secondary | ICD-10-CM | POA: Diagnosis not present

## 2015-11-22 DIAGNOSIS — D631 Anemia in chronic kidney disease: Secondary | ICD-10-CM | POA: Diagnosis not present

## 2015-11-22 DIAGNOSIS — Z79899 Other long term (current) drug therapy: Secondary | ICD-10-CM | POA: Diagnosis not present

## 2015-11-23 DIAGNOSIS — Z79899 Other long term (current) drug therapy: Secondary | ICD-10-CM | POA: Diagnosis not present

## 2015-11-23 DIAGNOSIS — N2581 Secondary hyperparathyroidism of renal origin: Secondary | ICD-10-CM | POA: Diagnosis not present

## 2015-11-23 DIAGNOSIS — D631 Anemia in chronic kidney disease: Secondary | ICD-10-CM | POA: Diagnosis not present

## 2015-11-23 DIAGNOSIS — D509 Iron deficiency anemia, unspecified: Secondary | ICD-10-CM | POA: Diagnosis not present

## 2015-11-23 DIAGNOSIS — N186 End stage renal disease: Secondary | ICD-10-CM | POA: Diagnosis not present

## 2015-11-23 DIAGNOSIS — Z5189 Encounter for other specified aftercare: Secondary | ICD-10-CM | POA: Diagnosis not present

## 2015-11-24 DIAGNOSIS — D631 Anemia in chronic kidney disease: Secondary | ICD-10-CM | POA: Diagnosis not present

## 2015-11-24 DIAGNOSIS — Z79899 Other long term (current) drug therapy: Secondary | ICD-10-CM | POA: Diagnosis not present

## 2015-11-24 DIAGNOSIS — Z5189 Encounter for other specified aftercare: Secondary | ICD-10-CM | POA: Diagnosis not present

## 2015-11-24 DIAGNOSIS — N186 End stage renal disease: Secondary | ICD-10-CM | POA: Diagnosis not present

## 2015-11-24 DIAGNOSIS — D509 Iron deficiency anemia, unspecified: Secondary | ICD-10-CM | POA: Diagnosis not present

## 2015-11-24 DIAGNOSIS — N2581 Secondary hyperparathyroidism of renal origin: Secondary | ICD-10-CM | POA: Diagnosis not present

## 2015-11-25 DIAGNOSIS — Z79899 Other long term (current) drug therapy: Secondary | ICD-10-CM | POA: Diagnosis not present

## 2015-11-25 DIAGNOSIS — Z5189 Encounter for other specified aftercare: Secondary | ICD-10-CM | POA: Diagnosis not present

## 2015-11-25 DIAGNOSIS — N2581 Secondary hyperparathyroidism of renal origin: Secondary | ICD-10-CM | POA: Diagnosis not present

## 2015-11-25 DIAGNOSIS — D631 Anemia in chronic kidney disease: Secondary | ICD-10-CM | POA: Diagnosis not present

## 2015-11-25 DIAGNOSIS — N186 End stage renal disease: Secondary | ICD-10-CM | POA: Diagnosis not present

## 2015-11-25 DIAGNOSIS — D509 Iron deficiency anemia, unspecified: Secondary | ICD-10-CM | POA: Diagnosis not present

## 2015-11-26 DIAGNOSIS — N186 End stage renal disease: Secondary | ICD-10-CM | POA: Diagnosis not present

## 2015-11-26 DIAGNOSIS — Z79899 Other long term (current) drug therapy: Secondary | ICD-10-CM | POA: Diagnosis not present

## 2015-11-26 DIAGNOSIS — D631 Anemia in chronic kidney disease: Secondary | ICD-10-CM | POA: Diagnosis not present

## 2015-11-26 DIAGNOSIS — Z5189 Encounter for other specified aftercare: Secondary | ICD-10-CM | POA: Diagnosis not present

## 2015-11-26 DIAGNOSIS — N2581 Secondary hyperparathyroidism of renal origin: Secondary | ICD-10-CM | POA: Diagnosis not present

## 2015-11-26 DIAGNOSIS — D509 Iron deficiency anemia, unspecified: Secondary | ICD-10-CM | POA: Diagnosis not present

## 2015-11-27 DIAGNOSIS — Z79899 Other long term (current) drug therapy: Secondary | ICD-10-CM | POA: Diagnosis not present

## 2015-11-27 DIAGNOSIS — I129 Hypertensive chronic kidney disease with stage 1 through stage 4 chronic kidney disease, or unspecified chronic kidney disease: Secondary | ICD-10-CM | POA: Diagnosis not present

## 2015-11-27 DIAGNOSIS — N2581 Secondary hyperparathyroidism of renal origin: Secondary | ICD-10-CM | POA: Diagnosis not present

## 2015-11-27 DIAGNOSIS — D631 Anemia in chronic kidney disease: Secondary | ICD-10-CM | POA: Diagnosis not present

## 2015-11-27 DIAGNOSIS — D509 Iron deficiency anemia, unspecified: Secondary | ICD-10-CM | POA: Diagnosis not present

## 2015-11-27 DIAGNOSIS — Z5189 Encounter for other specified aftercare: Secondary | ICD-10-CM | POA: Diagnosis not present

## 2015-11-27 DIAGNOSIS — N186 End stage renal disease: Secondary | ICD-10-CM | POA: Diagnosis not present

## 2015-11-27 DIAGNOSIS — Z992 Dependence on renal dialysis: Secondary | ICD-10-CM | POA: Diagnosis not present

## 2015-11-28 DIAGNOSIS — N2589 Other disorders resulting from impaired renal tubular function: Secondary | ICD-10-CM | POA: Diagnosis not present

## 2015-11-28 DIAGNOSIS — D631 Anemia in chronic kidney disease: Secondary | ICD-10-CM | POA: Diagnosis not present

## 2015-11-28 DIAGNOSIS — N2581 Secondary hyperparathyroidism of renal origin: Secondary | ICD-10-CM | POA: Diagnosis not present

## 2015-11-28 DIAGNOSIS — K769 Liver disease, unspecified: Secondary | ICD-10-CM | POA: Diagnosis not present

## 2015-11-28 DIAGNOSIS — N186 End stage renal disease: Secondary | ICD-10-CM | POA: Diagnosis not present

## 2015-11-28 DIAGNOSIS — E440 Moderate protein-calorie malnutrition: Secondary | ICD-10-CM | POA: Diagnosis not present

## 2015-11-28 DIAGNOSIS — D509 Iron deficiency anemia, unspecified: Secondary | ICD-10-CM | POA: Diagnosis not present

## 2015-11-29 DIAGNOSIS — D631 Anemia in chronic kidney disease: Secondary | ICD-10-CM | POA: Diagnosis not present

## 2015-11-29 DIAGNOSIS — N186 End stage renal disease: Secondary | ICD-10-CM | POA: Diagnosis not present

## 2015-11-29 DIAGNOSIS — E440 Moderate protein-calorie malnutrition: Secondary | ICD-10-CM | POA: Diagnosis not present

## 2015-11-29 DIAGNOSIS — N2589 Other disorders resulting from impaired renal tubular function: Secondary | ICD-10-CM | POA: Diagnosis not present

## 2015-11-29 DIAGNOSIS — D509 Iron deficiency anemia, unspecified: Secondary | ICD-10-CM | POA: Diagnosis not present

## 2015-11-29 DIAGNOSIS — K769 Liver disease, unspecified: Secondary | ICD-10-CM | POA: Diagnosis not present

## 2015-11-30 DIAGNOSIS — K769 Liver disease, unspecified: Secondary | ICD-10-CM | POA: Diagnosis not present

## 2015-11-30 DIAGNOSIS — D509 Iron deficiency anemia, unspecified: Secondary | ICD-10-CM | POA: Diagnosis not present

## 2015-11-30 DIAGNOSIS — N186 End stage renal disease: Secondary | ICD-10-CM | POA: Diagnosis not present

## 2015-11-30 DIAGNOSIS — D631 Anemia in chronic kidney disease: Secondary | ICD-10-CM | POA: Diagnosis not present

## 2015-11-30 DIAGNOSIS — E440 Moderate protein-calorie malnutrition: Secondary | ICD-10-CM | POA: Diagnosis not present

## 2015-11-30 DIAGNOSIS — N2589 Other disorders resulting from impaired renal tubular function: Secondary | ICD-10-CM | POA: Diagnosis not present

## 2015-12-01 DIAGNOSIS — D631 Anemia in chronic kidney disease: Secondary | ICD-10-CM | POA: Diagnosis not present

## 2015-12-01 DIAGNOSIS — N2589 Other disorders resulting from impaired renal tubular function: Secondary | ICD-10-CM | POA: Diagnosis not present

## 2015-12-01 DIAGNOSIS — E440 Moderate protein-calorie malnutrition: Secondary | ICD-10-CM | POA: Diagnosis not present

## 2015-12-01 DIAGNOSIS — N186 End stage renal disease: Secondary | ICD-10-CM | POA: Diagnosis not present

## 2015-12-01 DIAGNOSIS — D509 Iron deficiency anemia, unspecified: Secondary | ICD-10-CM | POA: Diagnosis not present

## 2015-12-01 DIAGNOSIS — K769 Liver disease, unspecified: Secondary | ICD-10-CM | POA: Diagnosis not present

## 2015-12-02 DIAGNOSIS — K769 Liver disease, unspecified: Secondary | ICD-10-CM | POA: Diagnosis not present

## 2015-12-02 DIAGNOSIS — N2589 Other disorders resulting from impaired renal tubular function: Secondary | ICD-10-CM | POA: Diagnosis not present

## 2015-12-02 DIAGNOSIS — N186 End stage renal disease: Secondary | ICD-10-CM | POA: Diagnosis not present

## 2015-12-02 DIAGNOSIS — D631 Anemia in chronic kidney disease: Secondary | ICD-10-CM | POA: Diagnosis not present

## 2015-12-02 DIAGNOSIS — E440 Moderate protein-calorie malnutrition: Secondary | ICD-10-CM | POA: Diagnosis not present

## 2015-12-02 DIAGNOSIS — D509 Iron deficiency anemia, unspecified: Secondary | ICD-10-CM | POA: Diagnosis not present

## 2015-12-03 DIAGNOSIS — K769 Liver disease, unspecified: Secondary | ICD-10-CM | POA: Diagnosis not present

## 2015-12-03 DIAGNOSIS — D631 Anemia in chronic kidney disease: Secondary | ICD-10-CM | POA: Diagnosis not present

## 2015-12-03 DIAGNOSIS — N186 End stage renal disease: Secondary | ICD-10-CM | POA: Diagnosis not present

## 2015-12-03 DIAGNOSIS — E784 Other hyperlipidemia: Secondary | ICD-10-CM | POA: Diagnosis not present

## 2015-12-03 DIAGNOSIS — D509 Iron deficiency anemia, unspecified: Secondary | ICD-10-CM | POA: Diagnosis not present

## 2015-12-03 DIAGNOSIS — R8299 Other abnormal findings in urine: Secondary | ICD-10-CM | POA: Diagnosis not present

## 2015-12-03 DIAGNOSIS — E440 Moderate protein-calorie malnutrition: Secondary | ICD-10-CM | POA: Diagnosis not present

## 2015-12-03 DIAGNOSIS — N2589 Other disorders resulting from impaired renal tubular function: Secondary | ICD-10-CM | POA: Diagnosis not present

## 2015-12-03 DIAGNOSIS — E1129 Type 2 diabetes mellitus with other diabetic kidney complication: Secondary | ICD-10-CM | POA: Diagnosis not present

## 2015-12-04 DIAGNOSIS — E440 Moderate protein-calorie malnutrition: Secondary | ICD-10-CM | POA: Diagnosis not present

## 2015-12-04 DIAGNOSIS — N2589 Other disorders resulting from impaired renal tubular function: Secondary | ICD-10-CM | POA: Diagnosis not present

## 2015-12-04 DIAGNOSIS — N186 End stage renal disease: Secondary | ICD-10-CM | POA: Diagnosis not present

## 2015-12-04 DIAGNOSIS — D631 Anemia in chronic kidney disease: Secondary | ICD-10-CM | POA: Diagnosis not present

## 2015-12-04 DIAGNOSIS — D509 Iron deficiency anemia, unspecified: Secondary | ICD-10-CM | POA: Diagnosis not present

## 2015-12-04 DIAGNOSIS — K769 Liver disease, unspecified: Secondary | ICD-10-CM | POA: Diagnosis not present

## 2015-12-05 DIAGNOSIS — N2589 Other disorders resulting from impaired renal tubular function: Secondary | ICD-10-CM | POA: Diagnosis not present

## 2015-12-05 DIAGNOSIS — E440 Moderate protein-calorie malnutrition: Secondary | ICD-10-CM | POA: Diagnosis not present

## 2015-12-05 DIAGNOSIS — K769 Liver disease, unspecified: Secondary | ICD-10-CM | POA: Diagnosis not present

## 2015-12-05 DIAGNOSIS — D509 Iron deficiency anemia, unspecified: Secondary | ICD-10-CM | POA: Diagnosis not present

## 2015-12-05 DIAGNOSIS — N186 End stage renal disease: Secondary | ICD-10-CM | POA: Diagnosis not present

## 2015-12-05 DIAGNOSIS — D631 Anemia in chronic kidney disease: Secondary | ICD-10-CM | POA: Diagnosis not present

## 2015-12-06 DIAGNOSIS — E440 Moderate protein-calorie malnutrition: Secondary | ICD-10-CM | POA: Diagnosis not present

## 2015-12-06 DIAGNOSIS — D631 Anemia in chronic kidney disease: Secondary | ICD-10-CM | POA: Diagnosis not present

## 2015-12-06 DIAGNOSIS — N186 End stage renal disease: Secondary | ICD-10-CM | POA: Diagnosis not present

## 2015-12-06 DIAGNOSIS — K769 Liver disease, unspecified: Secondary | ICD-10-CM | POA: Diagnosis not present

## 2015-12-06 DIAGNOSIS — N2589 Other disorders resulting from impaired renal tubular function: Secondary | ICD-10-CM | POA: Diagnosis not present

## 2015-12-06 DIAGNOSIS — D509 Iron deficiency anemia, unspecified: Secondary | ICD-10-CM | POA: Diagnosis not present

## 2015-12-07 DIAGNOSIS — D509 Iron deficiency anemia, unspecified: Secondary | ICD-10-CM | POA: Diagnosis not present

## 2015-12-07 DIAGNOSIS — N186 End stage renal disease: Secondary | ICD-10-CM | POA: Diagnosis not present

## 2015-12-07 DIAGNOSIS — D631 Anemia in chronic kidney disease: Secondary | ICD-10-CM | POA: Diagnosis not present

## 2015-12-07 DIAGNOSIS — E440 Moderate protein-calorie malnutrition: Secondary | ICD-10-CM | POA: Diagnosis not present

## 2015-12-07 DIAGNOSIS — N2589 Other disorders resulting from impaired renal tubular function: Secondary | ICD-10-CM | POA: Diagnosis not present

## 2015-12-07 DIAGNOSIS — K769 Liver disease, unspecified: Secondary | ICD-10-CM | POA: Diagnosis not present

## 2015-12-08 DIAGNOSIS — K769 Liver disease, unspecified: Secondary | ICD-10-CM | POA: Diagnosis not present

## 2015-12-08 DIAGNOSIS — D509 Iron deficiency anemia, unspecified: Secondary | ICD-10-CM | POA: Diagnosis not present

## 2015-12-08 DIAGNOSIS — N186 End stage renal disease: Secondary | ICD-10-CM | POA: Diagnosis not present

## 2015-12-08 DIAGNOSIS — E440 Moderate protein-calorie malnutrition: Secondary | ICD-10-CM | POA: Diagnosis not present

## 2015-12-08 DIAGNOSIS — D631 Anemia in chronic kidney disease: Secondary | ICD-10-CM | POA: Diagnosis not present

## 2015-12-08 DIAGNOSIS — N2589 Other disorders resulting from impaired renal tubular function: Secondary | ICD-10-CM | POA: Diagnosis not present

## 2015-12-09 DIAGNOSIS — E440 Moderate protein-calorie malnutrition: Secondary | ICD-10-CM | POA: Diagnosis not present

## 2015-12-09 DIAGNOSIS — N186 End stage renal disease: Secondary | ICD-10-CM | POA: Diagnosis not present

## 2015-12-09 DIAGNOSIS — N2589 Other disorders resulting from impaired renal tubular function: Secondary | ICD-10-CM | POA: Diagnosis not present

## 2015-12-09 DIAGNOSIS — K769 Liver disease, unspecified: Secondary | ICD-10-CM | POA: Diagnosis not present

## 2015-12-09 DIAGNOSIS — D631 Anemia in chronic kidney disease: Secondary | ICD-10-CM | POA: Diagnosis not present

## 2015-12-09 DIAGNOSIS — D509 Iron deficiency anemia, unspecified: Secondary | ICD-10-CM | POA: Diagnosis not present

## 2015-12-10 DIAGNOSIS — N2589 Other disorders resulting from impaired renal tubular function: Secondary | ICD-10-CM | POA: Diagnosis not present

## 2015-12-10 DIAGNOSIS — E440 Moderate protein-calorie malnutrition: Secondary | ICD-10-CM | POA: Diagnosis not present

## 2015-12-10 DIAGNOSIS — D631 Anemia in chronic kidney disease: Secondary | ICD-10-CM | POA: Diagnosis not present

## 2015-12-10 DIAGNOSIS — N186 End stage renal disease: Secondary | ICD-10-CM | POA: Diagnosis not present

## 2015-12-10 DIAGNOSIS — K769 Liver disease, unspecified: Secondary | ICD-10-CM | POA: Diagnosis not present

## 2015-12-10 DIAGNOSIS — D509 Iron deficiency anemia, unspecified: Secondary | ICD-10-CM | POA: Diagnosis not present

## 2015-12-11 DIAGNOSIS — D631 Anemia in chronic kidney disease: Secondary | ICD-10-CM | POA: Diagnosis not present

## 2015-12-11 DIAGNOSIS — N186 End stage renal disease: Secondary | ICD-10-CM | POA: Diagnosis not present

## 2015-12-11 DIAGNOSIS — E440 Moderate protein-calorie malnutrition: Secondary | ICD-10-CM | POA: Diagnosis not present

## 2015-12-11 DIAGNOSIS — N2589 Other disorders resulting from impaired renal tubular function: Secondary | ICD-10-CM | POA: Diagnosis not present

## 2015-12-11 DIAGNOSIS — D509 Iron deficiency anemia, unspecified: Secondary | ICD-10-CM | POA: Diagnosis not present

## 2015-12-11 DIAGNOSIS — K769 Liver disease, unspecified: Secondary | ICD-10-CM | POA: Diagnosis not present

## 2015-12-12 DIAGNOSIS — E440 Moderate protein-calorie malnutrition: Secondary | ICD-10-CM | POA: Diagnosis not present

## 2015-12-12 DIAGNOSIS — D631 Anemia in chronic kidney disease: Secondary | ICD-10-CM | POA: Diagnosis not present

## 2015-12-12 DIAGNOSIS — N2589 Other disorders resulting from impaired renal tubular function: Secondary | ICD-10-CM | POA: Diagnosis not present

## 2015-12-12 DIAGNOSIS — D509 Iron deficiency anemia, unspecified: Secondary | ICD-10-CM | POA: Diagnosis not present

## 2015-12-12 DIAGNOSIS — K769 Liver disease, unspecified: Secondary | ICD-10-CM | POA: Diagnosis not present

## 2015-12-12 DIAGNOSIS — N186 End stage renal disease: Secondary | ICD-10-CM | POA: Diagnosis not present

## 2015-12-13 DIAGNOSIS — E440 Moderate protein-calorie malnutrition: Secondary | ICD-10-CM | POA: Diagnosis not present

## 2015-12-13 DIAGNOSIS — N2589 Other disorders resulting from impaired renal tubular function: Secondary | ICD-10-CM | POA: Diagnosis not present

## 2015-12-13 DIAGNOSIS — D509 Iron deficiency anemia, unspecified: Secondary | ICD-10-CM | POA: Diagnosis not present

## 2015-12-13 DIAGNOSIS — K769 Liver disease, unspecified: Secondary | ICD-10-CM | POA: Diagnosis not present

## 2015-12-13 DIAGNOSIS — N186 End stage renal disease: Secondary | ICD-10-CM | POA: Diagnosis not present

## 2015-12-13 DIAGNOSIS — D631 Anemia in chronic kidney disease: Secondary | ICD-10-CM | POA: Diagnosis not present

## 2015-12-14 DIAGNOSIS — D631 Anemia in chronic kidney disease: Secondary | ICD-10-CM | POA: Diagnosis not present

## 2015-12-14 DIAGNOSIS — N186 End stage renal disease: Secondary | ICD-10-CM | POA: Diagnosis not present

## 2015-12-14 DIAGNOSIS — N2589 Other disorders resulting from impaired renal tubular function: Secondary | ICD-10-CM | POA: Diagnosis not present

## 2015-12-14 DIAGNOSIS — E440 Moderate protein-calorie malnutrition: Secondary | ICD-10-CM | POA: Diagnosis not present

## 2015-12-14 DIAGNOSIS — K769 Liver disease, unspecified: Secondary | ICD-10-CM | POA: Diagnosis not present

## 2015-12-14 DIAGNOSIS — D509 Iron deficiency anemia, unspecified: Secondary | ICD-10-CM | POA: Diagnosis not present

## 2015-12-15 DIAGNOSIS — K769 Liver disease, unspecified: Secondary | ICD-10-CM | POA: Diagnosis not present

## 2015-12-15 DIAGNOSIS — D509 Iron deficiency anemia, unspecified: Secondary | ICD-10-CM | POA: Diagnosis not present

## 2015-12-15 DIAGNOSIS — D631 Anemia in chronic kidney disease: Secondary | ICD-10-CM | POA: Diagnosis not present

## 2015-12-15 DIAGNOSIS — N2589 Other disorders resulting from impaired renal tubular function: Secondary | ICD-10-CM | POA: Diagnosis not present

## 2015-12-15 DIAGNOSIS — E440 Moderate protein-calorie malnutrition: Secondary | ICD-10-CM | POA: Diagnosis not present

## 2015-12-15 DIAGNOSIS — N186 End stage renal disease: Secondary | ICD-10-CM | POA: Diagnosis not present

## 2015-12-16 DIAGNOSIS — E440 Moderate protein-calorie malnutrition: Secondary | ICD-10-CM | POA: Diagnosis not present

## 2015-12-16 DIAGNOSIS — D509 Iron deficiency anemia, unspecified: Secondary | ICD-10-CM | POA: Diagnosis not present

## 2015-12-16 DIAGNOSIS — K769 Liver disease, unspecified: Secondary | ICD-10-CM | POA: Diagnosis not present

## 2015-12-16 DIAGNOSIS — D631 Anemia in chronic kidney disease: Secondary | ICD-10-CM | POA: Diagnosis not present

## 2015-12-16 DIAGNOSIS — N2589 Other disorders resulting from impaired renal tubular function: Secondary | ICD-10-CM | POA: Diagnosis not present

## 2015-12-16 DIAGNOSIS — N186 End stage renal disease: Secondary | ICD-10-CM | POA: Diagnosis not present

## 2015-12-17 DIAGNOSIS — D509 Iron deficiency anemia, unspecified: Secondary | ICD-10-CM | POA: Diagnosis not present

## 2015-12-17 DIAGNOSIS — N186 End stage renal disease: Secondary | ICD-10-CM | POA: Diagnosis not present

## 2015-12-17 DIAGNOSIS — D631 Anemia in chronic kidney disease: Secondary | ICD-10-CM | POA: Diagnosis not present

## 2015-12-17 DIAGNOSIS — E440 Moderate protein-calorie malnutrition: Secondary | ICD-10-CM | POA: Diagnosis not present

## 2015-12-17 DIAGNOSIS — K769 Liver disease, unspecified: Secondary | ICD-10-CM | POA: Diagnosis not present

## 2015-12-17 DIAGNOSIS — N2589 Other disorders resulting from impaired renal tubular function: Secondary | ICD-10-CM | POA: Diagnosis not present

## 2015-12-18 DIAGNOSIS — N186 End stage renal disease: Secondary | ICD-10-CM | POA: Diagnosis not present

## 2015-12-18 DIAGNOSIS — N2589 Other disorders resulting from impaired renal tubular function: Secondary | ICD-10-CM | POA: Diagnosis not present

## 2015-12-18 DIAGNOSIS — D631 Anemia in chronic kidney disease: Secondary | ICD-10-CM | POA: Diagnosis not present

## 2015-12-18 DIAGNOSIS — D509 Iron deficiency anemia, unspecified: Secondary | ICD-10-CM | POA: Diagnosis not present

## 2015-12-18 DIAGNOSIS — E440 Moderate protein-calorie malnutrition: Secondary | ICD-10-CM | POA: Diagnosis not present

## 2015-12-18 DIAGNOSIS — K769 Liver disease, unspecified: Secondary | ICD-10-CM | POA: Diagnosis not present

## 2015-12-19 DIAGNOSIS — N2589 Other disorders resulting from impaired renal tubular function: Secondary | ICD-10-CM | POA: Diagnosis not present

## 2015-12-19 DIAGNOSIS — D631 Anemia in chronic kidney disease: Secondary | ICD-10-CM | POA: Diagnosis not present

## 2015-12-19 DIAGNOSIS — N186 End stage renal disease: Secondary | ICD-10-CM | POA: Diagnosis not present

## 2015-12-19 DIAGNOSIS — E440 Moderate protein-calorie malnutrition: Secondary | ICD-10-CM | POA: Diagnosis not present

## 2015-12-19 DIAGNOSIS — K769 Liver disease, unspecified: Secondary | ICD-10-CM | POA: Diagnosis not present

## 2015-12-19 DIAGNOSIS — D509 Iron deficiency anemia, unspecified: Secondary | ICD-10-CM | POA: Diagnosis not present

## 2015-12-20 DIAGNOSIS — E440 Moderate protein-calorie malnutrition: Secondary | ICD-10-CM | POA: Diagnosis not present

## 2015-12-20 DIAGNOSIS — N186 End stage renal disease: Secondary | ICD-10-CM | POA: Diagnosis not present

## 2015-12-20 DIAGNOSIS — N2589 Other disorders resulting from impaired renal tubular function: Secondary | ICD-10-CM | POA: Diagnosis not present

## 2015-12-20 DIAGNOSIS — D509 Iron deficiency anemia, unspecified: Secondary | ICD-10-CM | POA: Diagnosis not present

## 2015-12-20 DIAGNOSIS — K769 Liver disease, unspecified: Secondary | ICD-10-CM | POA: Diagnosis not present

## 2015-12-20 DIAGNOSIS — D631 Anemia in chronic kidney disease: Secondary | ICD-10-CM | POA: Diagnosis not present

## 2015-12-21 DIAGNOSIS — E440 Moderate protein-calorie malnutrition: Secondary | ICD-10-CM | POA: Diagnosis not present

## 2015-12-21 DIAGNOSIS — N186 End stage renal disease: Secondary | ICD-10-CM | POA: Diagnosis not present

## 2015-12-21 DIAGNOSIS — D631 Anemia in chronic kidney disease: Secondary | ICD-10-CM | POA: Diagnosis not present

## 2015-12-21 DIAGNOSIS — D509 Iron deficiency anemia, unspecified: Secondary | ICD-10-CM | POA: Diagnosis not present

## 2015-12-21 DIAGNOSIS — K769 Liver disease, unspecified: Secondary | ICD-10-CM | POA: Diagnosis not present

## 2015-12-21 DIAGNOSIS — N2589 Other disorders resulting from impaired renal tubular function: Secondary | ICD-10-CM | POA: Diagnosis not present

## 2015-12-22 DIAGNOSIS — K769 Liver disease, unspecified: Secondary | ICD-10-CM | POA: Diagnosis not present

## 2015-12-22 DIAGNOSIS — D509 Iron deficiency anemia, unspecified: Secondary | ICD-10-CM | POA: Diagnosis not present

## 2015-12-22 DIAGNOSIS — N186 End stage renal disease: Secondary | ICD-10-CM | POA: Diagnosis not present

## 2015-12-22 DIAGNOSIS — E440 Moderate protein-calorie malnutrition: Secondary | ICD-10-CM | POA: Diagnosis not present

## 2015-12-22 DIAGNOSIS — D631 Anemia in chronic kidney disease: Secondary | ICD-10-CM | POA: Diagnosis not present

## 2015-12-22 DIAGNOSIS — N2589 Other disorders resulting from impaired renal tubular function: Secondary | ICD-10-CM | POA: Diagnosis not present

## 2015-12-23 DIAGNOSIS — D631 Anemia in chronic kidney disease: Secondary | ICD-10-CM | POA: Diagnosis not present

## 2015-12-23 DIAGNOSIS — E440 Moderate protein-calorie malnutrition: Secondary | ICD-10-CM | POA: Diagnosis not present

## 2015-12-23 DIAGNOSIS — K769 Liver disease, unspecified: Secondary | ICD-10-CM | POA: Diagnosis not present

## 2015-12-23 DIAGNOSIS — D509 Iron deficiency anemia, unspecified: Secondary | ICD-10-CM | POA: Diagnosis not present

## 2015-12-23 DIAGNOSIS — N186 End stage renal disease: Secondary | ICD-10-CM | POA: Diagnosis not present

## 2015-12-23 DIAGNOSIS — N2589 Other disorders resulting from impaired renal tubular function: Secondary | ICD-10-CM | POA: Diagnosis not present

## 2015-12-24 DIAGNOSIS — E440 Moderate protein-calorie malnutrition: Secondary | ICD-10-CM | POA: Diagnosis not present

## 2015-12-24 DIAGNOSIS — D509 Iron deficiency anemia, unspecified: Secondary | ICD-10-CM | POA: Diagnosis not present

## 2015-12-24 DIAGNOSIS — D631 Anemia in chronic kidney disease: Secondary | ICD-10-CM | POA: Diagnosis not present

## 2015-12-24 DIAGNOSIS — N186 End stage renal disease: Secondary | ICD-10-CM | POA: Diagnosis not present

## 2015-12-24 DIAGNOSIS — N2589 Other disorders resulting from impaired renal tubular function: Secondary | ICD-10-CM | POA: Diagnosis not present

## 2015-12-24 DIAGNOSIS — K769 Liver disease, unspecified: Secondary | ICD-10-CM | POA: Diagnosis not present

## 2015-12-25 DIAGNOSIS — E440 Moderate protein-calorie malnutrition: Secondary | ICD-10-CM | POA: Diagnosis not present

## 2015-12-25 DIAGNOSIS — D631 Anemia in chronic kidney disease: Secondary | ICD-10-CM | POA: Diagnosis not present

## 2015-12-25 DIAGNOSIS — N186 End stage renal disease: Secondary | ICD-10-CM | POA: Diagnosis not present

## 2015-12-25 DIAGNOSIS — K769 Liver disease, unspecified: Secondary | ICD-10-CM | POA: Diagnosis not present

## 2015-12-25 DIAGNOSIS — D509 Iron deficiency anemia, unspecified: Secondary | ICD-10-CM | POA: Diagnosis not present

## 2015-12-25 DIAGNOSIS — N2589 Other disorders resulting from impaired renal tubular function: Secondary | ICD-10-CM | POA: Diagnosis not present

## 2015-12-26 DIAGNOSIS — N2589 Other disorders resulting from impaired renal tubular function: Secondary | ICD-10-CM | POA: Diagnosis not present

## 2015-12-26 DIAGNOSIS — D509 Iron deficiency anemia, unspecified: Secondary | ICD-10-CM | POA: Diagnosis not present

## 2015-12-26 DIAGNOSIS — N186 End stage renal disease: Secondary | ICD-10-CM | POA: Diagnosis not present

## 2015-12-26 DIAGNOSIS — E440 Moderate protein-calorie malnutrition: Secondary | ICD-10-CM | POA: Diagnosis not present

## 2015-12-26 DIAGNOSIS — K769 Liver disease, unspecified: Secondary | ICD-10-CM | POA: Diagnosis not present

## 2015-12-26 DIAGNOSIS — D631 Anemia in chronic kidney disease: Secondary | ICD-10-CM | POA: Diagnosis not present

## 2015-12-27 DIAGNOSIS — D631 Anemia in chronic kidney disease: Secondary | ICD-10-CM | POA: Diagnosis not present

## 2015-12-27 DIAGNOSIS — E440 Moderate protein-calorie malnutrition: Secondary | ICD-10-CM | POA: Diagnosis not present

## 2015-12-27 DIAGNOSIS — D509 Iron deficiency anemia, unspecified: Secondary | ICD-10-CM | POA: Diagnosis not present

## 2015-12-27 DIAGNOSIS — N2589 Other disorders resulting from impaired renal tubular function: Secondary | ICD-10-CM | POA: Diagnosis not present

## 2015-12-27 DIAGNOSIS — N186 End stage renal disease: Secondary | ICD-10-CM | POA: Diagnosis not present

## 2015-12-27 DIAGNOSIS — K769 Liver disease, unspecified: Secondary | ICD-10-CM | POA: Diagnosis not present

## 2015-12-28 DIAGNOSIS — N186 End stage renal disease: Secondary | ICD-10-CM | POA: Diagnosis not present

## 2015-12-28 DIAGNOSIS — D509 Iron deficiency anemia, unspecified: Secondary | ICD-10-CM | POA: Diagnosis not present

## 2015-12-28 DIAGNOSIS — E440 Moderate protein-calorie malnutrition: Secondary | ICD-10-CM | POA: Diagnosis not present

## 2015-12-28 DIAGNOSIS — Z992 Dependence on renal dialysis: Secondary | ICD-10-CM | POA: Diagnosis not present

## 2015-12-28 DIAGNOSIS — N2589 Other disorders resulting from impaired renal tubular function: Secondary | ICD-10-CM | POA: Diagnosis not present

## 2015-12-28 DIAGNOSIS — K769 Liver disease, unspecified: Secondary | ICD-10-CM | POA: Diagnosis not present

## 2015-12-28 DIAGNOSIS — I129 Hypertensive chronic kidney disease with stage 1 through stage 4 chronic kidney disease, or unspecified chronic kidney disease: Secondary | ICD-10-CM | POA: Diagnosis not present

## 2015-12-28 DIAGNOSIS — D631 Anemia in chronic kidney disease: Secondary | ICD-10-CM | POA: Diagnosis not present

## 2015-12-29 DIAGNOSIS — E440 Moderate protein-calorie malnutrition: Secondary | ICD-10-CM | POA: Diagnosis not present

## 2015-12-29 DIAGNOSIS — N186 End stage renal disease: Secondary | ICD-10-CM | POA: Diagnosis not present

## 2015-12-29 DIAGNOSIS — K769 Liver disease, unspecified: Secondary | ICD-10-CM | POA: Diagnosis not present

## 2015-12-29 DIAGNOSIS — Z5189 Encounter for other specified aftercare: Secondary | ICD-10-CM | POA: Diagnosis not present

## 2015-12-29 DIAGNOSIS — D509 Iron deficiency anemia, unspecified: Secondary | ICD-10-CM | POA: Diagnosis not present

## 2015-12-29 DIAGNOSIS — Z79899 Other long term (current) drug therapy: Secondary | ICD-10-CM | POA: Diagnosis not present

## 2015-12-29 DIAGNOSIS — D631 Anemia in chronic kidney disease: Secondary | ICD-10-CM | POA: Diagnosis not present

## 2015-12-29 DIAGNOSIS — N2581 Secondary hyperparathyroidism of renal origin: Secondary | ICD-10-CM | POA: Diagnosis not present

## 2015-12-30 DIAGNOSIS — N186 End stage renal disease: Secondary | ICD-10-CM | POA: Diagnosis not present

## 2015-12-30 DIAGNOSIS — D631 Anemia in chronic kidney disease: Secondary | ICD-10-CM | POA: Diagnosis not present

## 2015-12-30 DIAGNOSIS — Z79899 Other long term (current) drug therapy: Secondary | ICD-10-CM | POA: Diagnosis not present

## 2015-12-30 DIAGNOSIS — N2581 Secondary hyperparathyroidism of renal origin: Secondary | ICD-10-CM | POA: Diagnosis not present

## 2015-12-30 DIAGNOSIS — D509 Iron deficiency anemia, unspecified: Secondary | ICD-10-CM | POA: Diagnosis not present

## 2015-12-30 DIAGNOSIS — Z5189 Encounter for other specified aftercare: Secondary | ICD-10-CM | POA: Diagnosis not present

## 2015-12-31 DIAGNOSIS — N186 End stage renal disease: Secondary | ICD-10-CM | POA: Diagnosis not present

## 2015-12-31 DIAGNOSIS — D631 Anemia in chronic kidney disease: Secondary | ICD-10-CM | POA: Diagnosis not present

## 2015-12-31 DIAGNOSIS — N2581 Secondary hyperparathyroidism of renal origin: Secondary | ICD-10-CM | POA: Diagnosis not present

## 2015-12-31 DIAGNOSIS — Z5189 Encounter for other specified aftercare: Secondary | ICD-10-CM | POA: Diagnosis not present

## 2015-12-31 DIAGNOSIS — Z79899 Other long term (current) drug therapy: Secondary | ICD-10-CM | POA: Diagnosis not present

## 2015-12-31 DIAGNOSIS — D509 Iron deficiency anemia, unspecified: Secondary | ICD-10-CM | POA: Diagnosis not present

## 2016-01-01 DIAGNOSIS — N2581 Secondary hyperparathyroidism of renal origin: Secondary | ICD-10-CM | POA: Diagnosis not present

## 2016-01-01 DIAGNOSIS — D631 Anemia in chronic kidney disease: Secondary | ICD-10-CM | POA: Diagnosis not present

## 2016-01-01 DIAGNOSIS — D509 Iron deficiency anemia, unspecified: Secondary | ICD-10-CM | POA: Diagnosis not present

## 2016-01-01 DIAGNOSIS — N186 End stage renal disease: Secondary | ICD-10-CM | POA: Diagnosis not present

## 2016-01-01 DIAGNOSIS — Z79899 Other long term (current) drug therapy: Secondary | ICD-10-CM | POA: Diagnosis not present

## 2016-01-01 DIAGNOSIS — Z5189 Encounter for other specified aftercare: Secondary | ICD-10-CM | POA: Diagnosis not present

## 2016-01-02 DIAGNOSIS — Z5189 Encounter for other specified aftercare: Secondary | ICD-10-CM | POA: Diagnosis not present

## 2016-01-02 DIAGNOSIS — D509 Iron deficiency anemia, unspecified: Secondary | ICD-10-CM | POA: Diagnosis not present

## 2016-01-02 DIAGNOSIS — D631 Anemia in chronic kidney disease: Secondary | ICD-10-CM | POA: Diagnosis not present

## 2016-01-02 DIAGNOSIS — N2581 Secondary hyperparathyroidism of renal origin: Secondary | ICD-10-CM | POA: Diagnosis not present

## 2016-01-02 DIAGNOSIS — N186 End stage renal disease: Secondary | ICD-10-CM | POA: Diagnosis not present

## 2016-01-02 DIAGNOSIS — Z79899 Other long term (current) drug therapy: Secondary | ICD-10-CM | POA: Diagnosis not present

## 2016-01-03 DIAGNOSIS — N2581 Secondary hyperparathyroidism of renal origin: Secondary | ICD-10-CM | POA: Diagnosis not present

## 2016-01-03 DIAGNOSIS — N186 End stage renal disease: Secondary | ICD-10-CM | POA: Diagnosis not present

## 2016-01-03 DIAGNOSIS — D631 Anemia in chronic kidney disease: Secondary | ICD-10-CM | POA: Diagnosis not present

## 2016-01-03 DIAGNOSIS — Z79899 Other long term (current) drug therapy: Secondary | ICD-10-CM | POA: Diagnosis not present

## 2016-01-03 DIAGNOSIS — R8299 Other abnormal findings in urine: Secondary | ICD-10-CM | POA: Diagnosis not present

## 2016-01-03 DIAGNOSIS — D509 Iron deficiency anemia, unspecified: Secondary | ICD-10-CM | POA: Diagnosis not present

## 2016-01-03 DIAGNOSIS — Z5189 Encounter for other specified aftercare: Secondary | ICD-10-CM | POA: Diagnosis not present

## 2016-01-04 DIAGNOSIS — D509 Iron deficiency anemia, unspecified: Secondary | ICD-10-CM | POA: Diagnosis not present

## 2016-01-04 DIAGNOSIS — N186 End stage renal disease: Secondary | ICD-10-CM | POA: Diagnosis not present

## 2016-01-04 DIAGNOSIS — Z5189 Encounter for other specified aftercare: Secondary | ICD-10-CM | POA: Diagnosis not present

## 2016-01-04 DIAGNOSIS — N2581 Secondary hyperparathyroidism of renal origin: Secondary | ICD-10-CM | POA: Diagnosis not present

## 2016-01-04 DIAGNOSIS — Z79899 Other long term (current) drug therapy: Secondary | ICD-10-CM | POA: Diagnosis not present

## 2016-01-04 DIAGNOSIS — D631 Anemia in chronic kidney disease: Secondary | ICD-10-CM | POA: Diagnosis not present

## 2016-01-05 DIAGNOSIS — Z5189 Encounter for other specified aftercare: Secondary | ICD-10-CM | POA: Diagnosis not present

## 2016-01-05 DIAGNOSIS — N186 End stage renal disease: Secondary | ICD-10-CM | POA: Diagnosis not present

## 2016-01-05 DIAGNOSIS — Z79899 Other long term (current) drug therapy: Secondary | ICD-10-CM | POA: Diagnosis not present

## 2016-01-05 DIAGNOSIS — N2581 Secondary hyperparathyroidism of renal origin: Secondary | ICD-10-CM | POA: Diagnosis not present

## 2016-01-05 DIAGNOSIS — D631 Anemia in chronic kidney disease: Secondary | ICD-10-CM | POA: Diagnosis not present

## 2016-01-05 DIAGNOSIS — D509 Iron deficiency anemia, unspecified: Secondary | ICD-10-CM | POA: Diagnosis not present

## 2016-01-06 DIAGNOSIS — Z5189 Encounter for other specified aftercare: Secondary | ICD-10-CM | POA: Diagnosis not present

## 2016-01-06 DIAGNOSIS — N2581 Secondary hyperparathyroidism of renal origin: Secondary | ICD-10-CM | POA: Diagnosis not present

## 2016-01-06 DIAGNOSIS — D631 Anemia in chronic kidney disease: Secondary | ICD-10-CM | POA: Diagnosis not present

## 2016-01-06 DIAGNOSIS — Z79899 Other long term (current) drug therapy: Secondary | ICD-10-CM | POA: Diagnosis not present

## 2016-01-06 DIAGNOSIS — N186 End stage renal disease: Secondary | ICD-10-CM | POA: Diagnosis not present

## 2016-01-06 DIAGNOSIS — D509 Iron deficiency anemia, unspecified: Secondary | ICD-10-CM | POA: Diagnosis not present

## 2016-01-07 DIAGNOSIS — Z79899 Other long term (current) drug therapy: Secondary | ICD-10-CM | POA: Diagnosis not present

## 2016-01-07 DIAGNOSIS — D509 Iron deficiency anemia, unspecified: Secondary | ICD-10-CM | POA: Diagnosis not present

## 2016-01-07 DIAGNOSIS — N2581 Secondary hyperparathyroidism of renal origin: Secondary | ICD-10-CM | POA: Diagnosis not present

## 2016-01-07 DIAGNOSIS — N186 End stage renal disease: Secondary | ICD-10-CM | POA: Diagnosis not present

## 2016-01-07 DIAGNOSIS — D631 Anemia in chronic kidney disease: Secondary | ICD-10-CM | POA: Diagnosis not present

## 2016-01-07 DIAGNOSIS — Z5189 Encounter for other specified aftercare: Secondary | ICD-10-CM | POA: Diagnosis not present

## 2016-01-08 DIAGNOSIS — Z5189 Encounter for other specified aftercare: Secondary | ICD-10-CM | POA: Diagnosis not present

## 2016-01-08 DIAGNOSIS — D509 Iron deficiency anemia, unspecified: Secondary | ICD-10-CM | POA: Diagnosis not present

## 2016-01-08 DIAGNOSIS — D631 Anemia in chronic kidney disease: Secondary | ICD-10-CM | POA: Diagnosis not present

## 2016-01-08 DIAGNOSIS — N186 End stage renal disease: Secondary | ICD-10-CM | POA: Diagnosis not present

## 2016-01-08 DIAGNOSIS — N2581 Secondary hyperparathyroidism of renal origin: Secondary | ICD-10-CM | POA: Diagnosis not present

## 2016-01-08 DIAGNOSIS — Z79899 Other long term (current) drug therapy: Secondary | ICD-10-CM | POA: Diagnosis not present

## 2016-01-09 DIAGNOSIS — N2581 Secondary hyperparathyroidism of renal origin: Secondary | ICD-10-CM | POA: Diagnosis not present

## 2016-01-09 DIAGNOSIS — Z79899 Other long term (current) drug therapy: Secondary | ICD-10-CM | POA: Diagnosis not present

## 2016-01-09 DIAGNOSIS — N186 End stage renal disease: Secondary | ICD-10-CM | POA: Diagnosis not present

## 2016-01-09 DIAGNOSIS — Z5189 Encounter for other specified aftercare: Secondary | ICD-10-CM | POA: Diagnosis not present

## 2016-01-09 DIAGNOSIS — D631 Anemia in chronic kidney disease: Secondary | ICD-10-CM | POA: Diagnosis not present

## 2016-01-09 DIAGNOSIS — D509 Iron deficiency anemia, unspecified: Secondary | ICD-10-CM | POA: Diagnosis not present

## 2016-01-10 DIAGNOSIS — D631 Anemia in chronic kidney disease: Secondary | ICD-10-CM | POA: Diagnosis not present

## 2016-01-10 DIAGNOSIS — D509 Iron deficiency anemia, unspecified: Secondary | ICD-10-CM | POA: Diagnosis not present

## 2016-01-10 DIAGNOSIS — N2581 Secondary hyperparathyroidism of renal origin: Secondary | ICD-10-CM | POA: Diagnosis not present

## 2016-01-10 DIAGNOSIS — Z79899 Other long term (current) drug therapy: Secondary | ICD-10-CM | POA: Diagnosis not present

## 2016-01-10 DIAGNOSIS — Z5189 Encounter for other specified aftercare: Secondary | ICD-10-CM | POA: Diagnosis not present

## 2016-01-10 DIAGNOSIS — N186 End stage renal disease: Secondary | ICD-10-CM | POA: Diagnosis not present

## 2016-01-11 DIAGNOSIS — N186 End stage renal disease: Secondary | ICD-10-CM | POA: Diagnosis not present

## 2016-01-11 DIAGNOSIS — N2581 Secondary hyperparathyroidism of renal origin: Secondary | ICD-10-CM | POA: Diagnosis not present

## 2016-01-11 DIAGNOSIS — Z79899 Other long term (current) drug therapy: Secondary | ICD-10-CM | POA: Diagnosis not present

## 2016-01-11 DIAGNOSIS — D509 Iron deficiency anemia, unspecified: Secondary | ICD-10-CM | POA: Diagnosis not present

## 2016-01-11 DIAGNOSIS — D631 Anemia in chronic kidney disease: Secondary | ICD-10-CM | POA: Diagnosis not present

## 2016-01-11 DIAGNOSIS — Z5189 Encounter for other specified aftercare: Secondary | ICD-10-CM | POA: Diagnosis not present

## 2016-01-12 DIAGNOSIS — D509 Iron deficiency anemia, unspecified: Secondary | ICD-10-CM | POA: Diagnosis not present

## 2016-01-12 DIAGNOSIS — Z5189 Encounter for other specified aftercare: Secondary | ICD-10-CM | POA: Diagnosis not present

## 2016-01-12 DIAGNOSIS — N186 End stage renal disease: Secondary | ICD-10-CM | POA: Diagnosis not present

## 2016-01-12 DIAGNOSIS — Z79899 Other long term (current) drug therapy: Secondary | ICD-10-CM | POA: Diagnosis not present

## 2016-01-12 DIAGNOSIS — D631 Anemia in chronic kidney disease: Secondary | ICD-10-CM | POA: Diagnosis not present

## 2016-01-12 DIAGNOSIS — N2581 Secondary hyperparathyroidism of renal origin: Secondary | ICD-10-CM | POA: Diagnosis not present

## 2016-01-13 DIAGNOSIS — D509 Iron deficiency anemia, unspecified: Secondary | ICD-10-CM | POA: Diagnosis not present

## 2016-01-13 DIAGNOSIS — Z79899 Other long term (current) drug therapy: Secondary | ICD-10-CM | POA: Diagnosis not present

## 2016-01-13 DIAGNOSIS — Z5189 Encounter for other specified aftercare: Secondary | ICD-10-CM | POA: Diagnosis not present

## 2016-01-13 DIAGNOSIS — N2581 Secondary hyperparathyroidism of renal origin: Secondary | ICD-10-CM | POA: Diagnosis not present

## 2016-01-13 DIAGNOSIS — N186 End stage renal disease: Secondary | ICD-10-CM | POA: Diagnosis not present

## 2016-01-13 DIAGNOSIS — D631 Anemia in chronic kidney disease: Secondary | ICD-10-CM | POA: Diagnosis not present

## 2016-01-14 DIAGNOSIS — Z5189 Encounter for other specified aftercare: Secondary | ICD-10-CM | POA: Diagnosis not present

## 2016-01-14 DIAGNOSIS — Z79899 Other long term (current) drug therapy: Secondary | ICD-10-CM | POA: Diagnosis not present

## 2016-01-14 DIAGNOSIS — D509 Iron deficiency anemia, unspecified: Secondary | ICD-10-CM | POA: Diagnosis not present

## 2016-01-14 DIAGNOSIS — D631 Anemia in chronic kidney disease: Secondary | ICD-10-CM | POA: Diagnosis not present

## 2016-01-14 DIAGNOSIS — N186 End stage renal disease: Secondary | ICD-10-CM | POA: Diagnosis not present

## 2016-01-14 DIAGNOSIS — N2581 Secondary hyperparathyroidism of renal origin: Secondary | ICD-10-CM | POA: Diagnosis not present

## 2016-01-15 DIAGNOSIS — D509 Iron deficiency anemia, unspecified: Secondary | ICD-10-CM | POA: Diagnosis not present

## 2016-01-15 DIAGNOSIS — Z79899 Other long term (current) drug therapy: Secondary | ICD-10-CM | POA: Diagnosis not present

## 2016-01-15 DIAGNOSIS — D631 Anemia in chronic kidney disease: Secondary | ICD-10-CM | POA: Diagnosis not present

## 2016-01-15 DIAGNOSIS — N186 End stage renal disease: Secondary | ICD-10-CM | POA: Diagnosis not present

## 2016-01-15 DIAGNOSIS — N2581 Secondary hyperparathyroidism of renal origin: Secondary | ICD-10-CM | POA: Diagnosis not present

## 2016-01-15 DIAGNOSIS — Z5189 Encounter for other specified aftercare: Secondary | ICD-10-CM | POA: Diagnosis not present

## 2016-01-16 DIAGNOSIS — N186 End stage renal disease: Secondary | ICD-10-CM | POA: Diagnosis not present

## 2016-01-16 DIAGNOSIS — D631 Anemia in chronic kidney disease: Secondary | ICD-10-CM | POA: Diagnosis not present

## 2016-01-16 DIAGNOSIS — N2581 Secondary hyperparathyroidism of renal origin: Secondary | ICD-10-CM | POA: Diagnosis not present

## 2016-01-16 DIAGNOSIS — Z79899 Other long term (current) drug therapy: Secondary | ICD-10-CM | POA: Diagnosis not present

## 2016-01-16 DIAGNOSIS — D509 Iron deficiency anemia, unspecified: Secondary | ICD-10-CM | POA: Diagnosis not present

## 2016-01-16 DIAGNOSIS — Z5189 Encounter for other specified aftercare: Secondary | ICD-10-CM | POA: Diagnosis not present

## 2016-01-17 DIAGNOSIS — D509 Iron deficiency anemia, unspecified: Secondary | ICD-10-CM | POA: Diagnosis not present

## 2016-01-17 DIAGNOSIS — D631 Anemia in chronic kidney disease: Secondary | ICD-10-CM | POA: Diagnosis not present

## 2016-01-17 DIAGNOSIS — N2581 Secondary hyperparathyroidism of renal origin: Secondary | ICD-10-CM | POA: Diagnosis not present

## 2016-01-17 DIAGNOSIS — Z5189 Encounter for other specified aftercare: Secondary | ICD-10-CM | POA: Diagnosis not present

## 2016-01-17 DIAGNOSIS — Z79899 Other long term (current) drug therapy: Secondary | ICD-10-CM | POA: Diagnosis not present

## 2016-01-17 DIAGNOSIS — N186 End stage renal disease: Secondary | ICD-10-CM | POA: Diagnosis not present

## 2016-01-18 DIAGNOSIS — D631 Anemia in chronic kidney disease: Secondary | ICD-10-CM | POA: Diagnosis not present

## 2016-01-18 DIAGNOSIS — Z5189 Encounter for other specified aftercare: Secondary | ICD-10-CM | POA: Diagnosis not present

## 2016-01-18 DIAGNOSIS — N2581 Secondary hyperparathyroidism of renal origin: Secondary | ICD-10-CM | POA: Diagnosis not present

## 2016-01-18 DIAGNOSIS — D509 Iron deficiency anemia, unspecified: Secondary | ICD-10-CM | POA: Diagnosis not present

## 2016-01-18 DIAGNOSIS — N186 End stage renal disease: Secondary | ICD-10-CM | POA: Diagnosis not present

## 2016-01-18 DIAGNOSIS — Z79899 Other long term (current) drug therapy: Secondary | ICD-10-CM | POA: Diagnosis not present

## 2016-01-19 DIAGNOSIS — D631 Anemia in chronic kidney disease: Secondary | ICD-10-CM | POA: Diagnosis not present

## 2016-01-19 DIAGNOSIS — N186 End stage renal disease: Secondary | ICD-10-CM | POA: Diagnosis not present

## 2016-01-19 DIAGNOSIS — D509 Iron deficiency anemia, unspecified: Secondary | ICD-10-CM | POA: Diagnosis not present

## 2016-01-19 DIAGNOSIS — N2581 Secondary hyperparathyroidism of renal origin: Secondary | ICD-10-CM | POA: Diagnosis not present

## 2016-01-19 DIAGNOSIS — Z79899 Other long term (current) drug therapy: Secondary | ICD-10-CM | POA: Diagnosis not present

## 2016-01-19 DIAGNOSIS — Z5189 Encounter for other specified aftercare: Secondary | ICD-10-CM | POA: Diagnosis not present

## 2016-01-20 DIAGNOSIS — N186 End stage renal disease: Secondary | ICD-10-CM | POA: Diagnosis not present

## 2016-01-20 DIAGNOSIS — Z79899 Other long term (current) drug therapy: Secondary | ICD-10-CM | POA: Diagnosis not present

## 2016-01-20 DIAGNOSIS — N2581 Secondary hyperparathyroidism of renal origin: Secondary | ICD-10-CM | POA: Diagnosis not present

## 2016-01-20 DIAGNOSIS — D631 Anemia in chronic kidney disease: Secondary | ICD-10-CM | POA: Diagnosis not present

## 2016-01-20 DIAGNOSIS — Z5189 Encounter for other specified aftercare: Secondary | ICD-10-CM | POA: Diagnosis not present

## 2016-01-20 DIAGNOSIS — D509 Iron deficiency anemia, unspecified: Secondary | ICD-10-CM | POA: Diagnosis not present

## 2016-01-21 DIAGNOSIS — D631 Anemia in chronic kidney disease: Secondary | ICD-10-CM | POA: Diagnosis not present

## 2016-01-21 DIAGNOSIS — Z79899 Other long term (current) drug therapy: Secondary | ICD-10-CM | POA: Diagnosis not present

## 2016-01-21 DIAGNOSIS — N2581 Secondary hyperparathyroidism of renal origin: Secondary | ICD-10-CM | POA: Diagnosis not present

## 2016-01-21 DIAGNOSIS — Z5189 Encounter for other specified aftercare: Secondary | ICD-10-CM | POA: Diagnosis not present

## 2016-01-21 DIAGNOSIS — D509 Iron deficiency anemia, unspecified: Secondary | ICD-10-CM | POA: Diagnosis not present

## 2016-01-21 DIAGNOSIS — N186 End stage renal disease: Secondary | ICD-10-CM | POA: Diagnosis not present

## 2016-01-22 DIAGNOSIS — D631 Anemia in chronic kidney disease: Secondary | ICD-10-CM | POA: Diagnosis not present

## 2016-01-22 DIAGNOSIS — N186 End stage renal disease: Secondary | ICD-10-CM | POA: Diagnosis not present

## 2016-01-22 DIAGNOSIS — Z5189 Encounter for other specified aftercare: Secondary | ICD-10-CM | POA: Diagnosis not present

## 2016-01-22 DIAGNOSIS — Z79899 Other long term (current) drug therapy: Secondary | ICD-10-CM | POA: Diagnosis not present

## 2016-01-22 DIAGNOSIS — D509 Iron deficiency anemia, unspecified: Secondary | ICD-10-CM | POA: Diagnosis not present

## 2016-01-22 DIAGNOSIS — N2581 Secondary hyperparathyroidism of renal origin: Secondary | ICD-10-CM | POA: Diagnosis not present

## 2016-01-23 DIAGNOSIS — N2581 Secondary hyperparathyroidism of renal origin: Secondary | ICD-10-CM | POA: Diagnosis not present

## 2016-01-23 DIAGNOSIS — N186 End stage renal disease: Secondary | ICD-10-CM | POA: Diagnosis not present

## 2016-01-23 DIAGNOSIS — Z79899 Other long term (current) drug therapy: Secondary | ICD-10-CM | POA: Diagnosis not present

## 2016-01-23 DIAGNOSIS — Z5189 Encounter for other specified aftercare: Secondary | ICD-10-CM | POA: Diagnosis not present

## 2016-01-23 DIAGNOSIS — D631 Anemia in chronic kidney disease: Secondary | ICD-10-CM | POA: Diagnosis not present

## 2016-01-23 DIAGNOSIS — D509 Iron deficiency anemia, unspecified: Secondary | ICD-10-CM | POA: Diagnosis not present

## 2016-01-24 DIAGNOSIS — Z79899 Other long term (current) drug therapy: Secondary | ICD-10-CM | POA: Diagnosis not present

## 2016-01-24 DIAGNOSIS — Z5189 Encounter for other specified aftercare: Secondary | ICD-10-CM | POA: Diagnosis not present

## 2016-01-24 DIAGNOSIS — D509 Iron deficiency anemia, unspecified: Secondary | ICD-10-CM | POA: Diagnosis not present

## 2016-01-24 DIAGNOSIS — N2581 Secondary hyperparathyroidism of renal origin: Secondary | ICD-10-CM | POA: Diagnosis not present

## 2016-01-24 DIAGNOSIS — N186 End stage renal disease: Secondary | ICD-10-CM | POA: Diagnosis not present

## 2016-01-24 DIAGNOSIS — D631 Anemia in chronic kidney disease: Secondary | ICD-10-CM | POA: Diagnosis not present

## 2016-01-25 DIAGNOSIS — N2581 Secondary hyperparathyroidism of renal origin: Secondary | ICD-10-CM | POA: Diagnosis not present

## 2016-01-25 DIAGNOSIS — N186 End stage renal disease: Secondary | ICD-10-CM | POA: Diagnosis not present

## 2016-01-25 DIAGNOSIS — D509 Iron deficiency anemia, unspecified: Secondary | ICD-10-CM | POA: Diagnosis not present

## 2016-01-25 DIAGNOSIS — Z992 Dependence on renal dialysis: Secondary | ICD-10-CM | POA: Diagnosis not present

## 2016-01-25 DIAGNOSIS — Z5189 Encounter for other specified aftercare: Secondary | ICD-10-CM | POA: Diagnosis not present

## 2016-01-25 DIAGNOSIS — Z79899 Other long term (current) drug therapy: Secondary | ICD-10-CM | POA: Diagnosis not present

## 2016-01-25 DIAGNOSIS — I129 Hypertensive chronic kidney disease with stage 1 through stage 4 chronic kidney disease, or unspecified chronic kidney disease: Secondary | ICD-10-CM | POA: Diagnosis not present

## 2016-01-25 DIAGNOSIS — D631 Anemia in chronic kidney disease: Secondary | ICD-10-CM | POA: Diagnosis not present

## 2016-01-26 DIAGNOSIS — N2581 Secondary hyperparathyroidism of renal origin: Secondary | ICD-10-CM | POA: Diagnosis not present

## 2016-01-26 DIAGNOSIS — E440 Moderate protein-calorie malnutrition: Secondary | ICD-10-CM | POA: Diagnosis not present

## 2016-01-26 DIAGNOSIS — Z79899 Other long term (current) drug therapy: Secondary | ICD-10-CM | POA: Diagnosis not present

## 2016-01-26 DIAGNOSIS — Z5189 Encounter for other specified aftercare: Secondary | ICD-10-CM | POA: Diagnosis not present

## 2016-01-26 DIAGNOSIS — D631 Anemia in chronic kidney disease: Secondary | ICD-10-CM | POA: Diagnosis not present

## 2016-01-26 DIAGNOSIS — N186 End stage renal disease: Secondary | ICD-10-CM | POA: Diagnosis not present

## 2016-01-26 DIAGNOSIS — D509 Iron deficiency anemia, unspecified: Secondary | ICD-10-CM | POA: Diagnosis not present

## 2016-01-26 DIAGNOSIS — K769 Liver disease, unspecified: Secondary | ICD-10-CM | POA: Diagnosis not present

## 2016-01-27 DIAGNOSIS — D509 Iron deficiency anemia, unspecified: Secondary | ICD-10-CM | POA: Diagnosis not present

## 2016-01-27 DIAGNOSIS — K769 Liver disease, unspecified: Secondary | ICD-10-CM | POA: Diagnosis not present

## 2016-01-27 DIAGNOSIS — N2581 Secondary hyperparathyroidism of renal origin: Secondary | ICD-10-CM | POA: Diagnosis not present

## 2016-01-27 DIAGNOSIS — N186 End stage renal disease: Secondary | ICD-10-CM | POA: Diagnosis not present

## 2016-01-27 DIAGNOSIS — Z5189 Encounter for other specified aftercare: Secondary | ICD-10-CM | POA: Diagnosis not present

## 2016-01-27 DIAGNOSIS — D631 Anemia in chronic kidney disease: Secondary | ICD-10-CM | POA: Diagnosis not present

## 2016-01-28 DIAGNOSIS — D509 Iron deficiency anemia, unspecified: Secondary | ICD-10-CM | POA: Diagnosis not present

## 2016-01-28 DIAGNOSIS — N186 End stage renal disease: Secondary | ICD-10-CM | POA: Diagnosis not present

## 2016-01-28 DIAGNOSIS — K769 Liver disease, unspecified: Secondary | ICD-10-CM | POA: Diagnosis not present

## 2016-01-28 DIAGNOSIS — R8299 Other abnormal findings in urine: Secondary | ICD-10-CM | POA: Diagnosis not present

## 2016-01-28 DIAGNOSIS — Z5189 Encounter for other specified aftercare: Secondary | ICD-10-CM | POA: Diagnosis not present

## 2016-01-28 DIAGNOSIS — D631 Anemia in chronic kidney disease: Secondary | ICD-10-CM | POA: Diagnosis not present

## 2016-01-28 DIAGNOSIS — N2581 Secondary hyperparathyroidism of renal origin: Secondary | ICD-10-CM | POA: Diagnosis not present

## 2016-01-29 DIAGNOSIS — Z5189 Encounter for other specified aftercare: Secondary | ICD-10-CM | POA: Diagnosis not present

## 2016-01-29 DIAGNOSIS — K769 Liver disease, unspecified: Secondary | ICD-10-CM | POA: Diagnosis not present

## 2016-01-29 DIAGNOSIS — D509 Iron deficiency anemia, unspecified: Secondary | ICD-10-CM | POA: Diagnosis not present

## 2016-01-29 DIAGNOSIS — N2581 Secondary hyperparathyroidism of renal origin: Secondary | ICD-10-CM | POA: Diagnosis not present

## 2016-01-29 DIAGNOSIS — D631 Anemia in chronic kidney disease: Secondary | ICD-10-CM | POA: Diagnosis not present

## 2016-01-29 DIAGNOSIS — N186 End stage renal disease: Secondary | ICD-10-CM | POA: Diagnosis not present

## 2016-01-30 DIAGNOSIS — Z5189 Encounter for other specified aftercare: Secondary | ICD-10-CM | POA: Diagnosis not present

## 2016-01-30 DIAGNOSIS — K769 Liver disease, unspecified: Secondary | ICD-10-CM | POA: Diagnosis not present

## 2016-01-30 DIAGNOSIS — N186 End stage renal disease: Secondary | ICD-10-CM | POA: Diagnosis not present

## 2016-01-30 DIAGNOSIS — D509 Iron deficiency anemia, unspecified: Secondary | ICD-10-CM | POA: Diagnosis not present

## 2016-01-30 DIAGNOSIS — N2581 Secondary hyperparathyroidism of renal origin: Secondary | ICD-10-CM | POA: Diagnosis not present

## 2016-01-30 DIAGNOSIS — D631 Anemia in chronic kidney disease: Secondary | ICD-10-CM | POA: Diagnosis not present

## 2016-01-31 DIAGNOSIS — K769 Liver disease, unspecified: Secondary | ICD-10-CM | POA: Diagnosis not present

## 2016-01-31 DIAGNOSIS — D509 Iron deficiency anemia, unspecified: Secondary | ICD-10-CM | POA: Diagnosis not present

## 2016-01-31 DIAGNOSIS — N186 End stage renal disease: Secondary | ICD-10-CM | POA: Diagnosis not present

## 2016-01-31 DIAGNOSIS — D631 Anemia in chronic kidney disease: Secondary | ICD-10-CM | POA: Diagnosis not present

## 2016-01-31 DIAGNOSIS — Z5189 Encounter for other specified aftercare: Secondary | ICD-10-CM | POA: Diagnosis not present

## 2016-01-31 DIAGNOSIS — N2581 Secondary hyperparathyroidism of renal origin: Secondary | ICD-10-CM | POA: Diagnosis not present

## 2016-02-01 DIAGNOSIS — K769 Liver disease, unspecified: Secondary | ICD-10-CM | POA: Diagnosis not present

## 2016-02-01 DIAGNOSIS — N2581 Secondary hyperparathyroidism of renal origin: Secondary | ICD-10-CM | POA: Diagnosis not present

## 2016-02-01 DIAGNOSIS — D509 Iron deficiency anemia, unspecified: Secondary | ICD-10-CM | POA: Diagnosis not present

## 2016-02-01 DIAGNOSIS — Z5189 Encounter for other specified aftercare: Secondary | ICD-10-CM | POA: Diagnosis not present

## 2016-02-01 DIAGNOSIS — D631 Anemia in chronic kidney disease: Secondary | ICD-10-CM | POA: Diagnosis not present

## 2016-02-01 DIAGNOSIS — N186 End stage renal disease: Secondary | ICD-10-CM | POA: Diagnosis not present

## 2016-02-02 DIAGNOSIS — N2581 Secondary hyperparathyroidism of renal origin: Secondary | ICD-10-CM | POA: Diagnosis not present

## 2016-02-02 DIAGNOSIS — D631 Anemia in chronic kidney disease: Secondary | ICD-10-CM | POA: Diagnosis not present

## 2016-02-02 DIAGNOSIS — N186 End stage renal disease: Secondary | ICD-10-CM | POA: Diagnosis not present

## 2016-02-02 DIAGNOSIS — K769 Liver disease, unspecified: Secondary | ICD-10-CM | POA: Diagnosis not present

## 2016-02-02 DIAGNOSIS — Z5189 Encounter for other specified aftercare: Secondary | ICD-10-CM | POA: Diagnosis not present

## 2016-02-02 DIAGNOSIS — D509 Iron deficiency anemia, unspecified: Secondary | ICD-10-CM | POA: Diagnosis not present

## 2016-02-03 DIAGNOSIS — Z5189 Encounter for other specified aftercare: Secondary | ICD-10-CM | POA: Diagnosis not present

## 2016-02-03 DIAGNOSIS — D509 Iron deficiency anemia, unspecified: Secondary | ICD-10-CM | POA: Diagnosis not present

## 2016-02-03 DIAGNOSIS — D631 Anemia in chronic kidney disease: Secondary | ICD-10-CM | POA: Diagnosis not present

## 2016-02-03 DIAGNOSIS — N2581 Secondary hyperparathyroidism of renal origin: Secondary | ICD-10-CM | POA: Diagnosis not present

## 2016-02-03 DIAGNOSIS — N186 End stage renal disease: Secondary | ICD-10-CM | POA: Diagnosis not present

## 2016-02-03 DIAGNOSIS — K769 Liver disease, unspecified: Secondary | ICD-10-CM | POA: Diagnosis not present

## 2016-02-04 DIAGNOSIS — D631 Anemia in chronic kidney disease: Secondary | ICD-10-CM | POA: Diagnosis not present

## 2016-02-04 DIAGNOSIS — D509 Iron deficiency anemia, unspecified: Secondary | ICD-10-CM | POA: Diagnosis not present

## 2016-02-04 DIAGNOSIS — N2581 Secondary hyperparathyroidism of renal origin: Secondary | ICD-10-CM | POA: Diagnosis not present

## 2016-02-04 DIAGNOSIS — N186 End stage renal disease: Secondary | ICD-10-CM | POA: Diagnosis not present

## 2016-02-04 DIAGNOSIS — Z5189 Encounter for other specified aftercare: Secondary | ICD-10-CM | POA: Diagnosis not present

## 2016-02-04 DIAGNOSIS — K769 Liver disease, unspecified: Secondary | ICD-10-CM | POA: Diagnosis not present

## 2016-02-05 DIAGNOSIS — Z5189 Encounter for other specified aftercare: Secondary | ICD-10-CM | POA: Diagnosis not present

## 2016-02-05 DIAGNOSIS — K769 Liver disease, unspecified: Secondary | ICD-10-CM | POA: Diagnosis not present

## 2016-02-05 DIAGNOSIS — D631 Anemia in chronic kidney disease: Secondary | ICD-10-CM | POA: Diagnosis not present

## 2016-02-05 DIAGNOSIS — N2581 Secondary hyperparathyroidism of renal origin: Secondary | ICD-10-CM | POA: Diagnosis not present

## 2016-02-05 DIAGNOSIS — N186 End stage renal disease: Secondary | ICD-10-CM | POA: Diagnosis not present

## 2016-02-05 DIAGNOSIS — D509 Iron deficiency anemia, unspecified: Secondary | ICD-10-CM | POA: Diagnosis not present

## 2016-02-06 DIAGNOSIS — D509 Iron deficiency anemia, unspecified: Secondary | ICD-10-CM | POA: Diagnosis not present

## 2016-02-06 DIAGNOSIS — Z5189 Encounter for other specified aftercare: Secondary | ICD-10-CM | POA: Diagnosis not present

## 2016-02-06 DIAGNOSIS — D631 Anemia in chronic kidney disease: Secondary | ICD-10-CM | POA: Diagnosis not present

## 2016-02-06 DIAGNOSIS — N186 End stage renal disease: Secondary | ICD-10-CM | POA: Diagnosis not present

## 2016-02-06 DIAGNOSIS — N2581 Secondary hyperparathyroidism of renal origin: Secondary | ICD-10-CM | POA: Diagnosis not present

## 2016-02-06 DIAGNOSIS — K769 Liver disease, unspecified: Secondary | ICD-10-CM | POA: Diagnosis not present

## 2016-02-07 DIAGNOSIS — N2581 Secondary hyperparathyroidism of renal origin: Secondary | ICD-10-CM | POA: Diagnosis not present

## 2016-02-07 DIAGNOSIS — Z5189 Encounter for other specified aftercare: Secondary | ICD-10-CM | POA: Diagnosis not present

## 2016-02-07 DIAGNOSIS — K769 Liver disease, unspecified: Secondary | ICD-10-CM | POA: Diagnosis not present

## 2016-02-07 DIAGNOSIS — D509 Iron deficiency anemia, unspecified: Secondary | ICD-10-CM | POA: Diagnosis not present

## 2016-02-07 DIAGNOSIS — N186 End stage renal disease: Secondary | ICD-10-CM | POA: Diagnosis not present

## 2016-02-07 DIAGNOSIS — D631 Anemia in chronic kidney disease: Secondary | ICD-10-CM | POA: Diagnosis not present

## 2016-02-08 DIAGNOSIS — K769 Liver disease, unspecified: Secondary | ICD-10-CM | POA: Diagnosis not present

## 2016-02-08 DIAGNOSIS — N2581 Secondary hyperparathyroidism of renal origin: Secondary | ICD-10-CM | POA: Diagnosis not present

## 2016-02-08 DIAGNOSIS — N186 End stage renal disease: Secondary | ICD-10-CM | POA: Diagnosis not present

## 2016-02-08 DIAGNOSIS — D631 Anemia in chronic kidney disease: Secondary | ICD-10-CM | POA: Diagnosis not present

## 2016-02-08 DIAGNOSIS — D509 Iron deficiency anemia, unspecified: Secondary | ICD-10-CM | POA: Diagnosis not present

## 2016-02-08 DIAGNOSIS — Z5189 Encounter for other specified aftercare: Secondary | ICD-10-CM | POA: Diagnosis not present

## 2016-02-09 DIAGNOSIS — D509 Iron deficiency anemia, unspecified: Secondary | ICD-10-CM | POA: Diagnosis not present

## 2016-02-09 DIAGNOSIS — K769 Liver disease, unspecified: Secondary | ICD-10-CM | POA: Diagnosis not present

## 2016-02-09 DIAGNOSIS — D631 Anemia in chronic kidney disease: Secondary | ICD-10-CM | POA: Diagnosis not present

## 2016-02-09 DIAGNOSIS — Z5189 Encounter for other specified aftercare: Secondary | ICD-10-CM | POA: Diagnosis not present

## 2016-02-09 DIAGNOSIS — N2581 Secondary hyperparathyroidism of renal origin: Secondary | ICD-10-CM | POA: Diagnosis not present

## 2016-02-09 DIAGNOSIS — N186 End stage renal disease: Secondary | ICD-10-CM | POA: Diagnosis not present

## 2016-02-10 DIAGNOSIS — D509 Iron deficiency anemia, unspecified: Secondary | ICD-10-CM | POA: Diagnosis not present

## 2016-02-10 DIAGNOSIS — N186 End stage renal disease: Secondary | ICD-10-CM | POA: Diagnosis not present

## 2016-02-10 DIAGNOSIS — K769 Liver disease, unspecified: Secondary | ICD-10-CM | POA: Diagnosis not present

## 2016-02-10 DIAGNOSIS — Z5189 Encounter for other specified aftercare: Secondary | ICD-10-CM | POA: Diagnosis not present

## 2016-02-10 DIAGNOSIS — N2581 Secondary hyperparathyroidism of renal origin: Secondary | ICD-10-CM | POA: Diagnosis not present

## 2016-02-10 DIAGNOSIS — D631 Anemia in chronic kidney disease: Secondary | ICD-10-CM | POA: Diagnosis not present

## 2016-02-11 DIAGNOSIS — N186 End stage renal disease: Secondary | ICD-10-CM | POA: Diagnosis not present

## 2016-02-11 DIAGNOSIS — Z5189 Encounter for other specified aftercare: Secondary | ICD-10-CM | POA: Diagnosis not present

## 2016-02-11 DIAGNOSIS — D631 Anemia in chronic kidney disease: Secondary | ICD-10-CM | POA: Diagnosis not present

## 2016-02-11 DIAGNOSIS — K769 Liver disease, unspecified: Secondary | ICD-10-CM | POA: Diagnosis not present

## 2016-02-11 DIAGNOSIS — N2581 Secondary hyperparathyroidism of renal origin: Secondary | ICD-10-CM | POA: Diagnosis not present

## 2016-02-11 DIAGNOSIS — D509 Iron deficiency anemia, unspecified: Secondary | ICD-10-CM | POA: Diagnosis not present

## 2016-02-12 DIAGNOSIS — D509 Iron deficiency anemia, unspecified: Secondary | ICD-10-CM | POA: Diagnosis not present

## 2016-02-12 DIAGNOSIS — N186 End stage renal disease: Secondary | ICD-10-CM | POA: Diagnosis not present

## 2016-02-12 DIAGNOSIS — D631 Anemia in chronic kidney disease: Secondary | ICD-10-CM | POA: Diagnosis not present

## 2016-02-12 DIAGNOSIS — K769 Liver disease, unspecified: Secondary | ICD-10-CM | POA: Diagnosis not present

## 2016-02-12 DIAGNOSIS — N2581 Secondary hyperparathyroidism of renal origin: Secondary | ICD-10-CM | POA: Diagnosis not present

## 2016-02-12 DIAGNOSIS — Z5189 Encounter for other specified aftercare: Secondary | ICD-10-CM | POA: Diagnosis not present

## 2016-02-13 DIAGNOSIS — N186 End stage renal disease: Secondary | ICD-10-CM | POA: Diagnosis not present

## 2016-02-13 DIAGNOSIS — D509 Iron deficiency anemia, unspecified: Secondary | ICD-10-CM | POA: Diagnosis not present

## 2016-02-13 DIAGNOSIS — K769 Liver disease, unspecified: Secondary | ICD-10-CM | POA: Diagnosis not present

## 2016-02-13 DIAGNOSIS — Z5189 Encounter for other specified aftercare: Secondary | ICD-10-CM | POA: Diagnosis not present

## 2016-02-13 DIAGNOSIS — N2581 Secondary hyperparathyroidism of renal origin: Secondary | ICD-10-CM | POA: Diagnosis not present

## 2016-02-13 DIAGNOSIS — D631 Anemia in chronic kidney disease: Secondary | ICD-10-CM | POA: Diagnosis not present

## 2016-02-14 DIAGNOSIS — Z5189 Encounter for other specified aftercare: Secondary | ICD-10-CM | POA: Diagnosis not present

## 2016-02-14 DIAGNOSIS — D631 Anemia in chronic kidney disease: Secondary | ICD-10-CM | POA: Diagnosis not present

## 2016-02-14 DIAGNOSIS — K769 Liver disease, unspecified: Secondary | ICD-10-CM | POA: Diagnosis not present

## 2016-02-14 DIAGNOSIS — D509 Iron deficiency anemia, unspecified: Secondary | ICD-10-CM | POA: Diagnosis not present

## 2016-02-14 DIAGNOSIS — N2581 Secondary hyperparathyroidism of renal origin: Secondary | ICD-10-CM | POA: Diagnosis not present

## 2016-02-14 DIAGNOSIS — N186 End stage renal disease: Secondary | ICD-10-CM | POA: Diagnosis not present

## 2016-02-15 DIAGNOSIS — D631 Anemia in chronic kidney disease: Secondary | ICD-10-CM | POA: Diagnosis not present

## 2016-02-15 DIAGNOSIS — K769 Liver disease, unspecified: Secondary | ICD-10-CM | POA: Diagnosis not present

## 2016-02-15 DIAGNOSIS — Z5189 Encounter for other specified aftercare: Secondary | ICD-10-CM | POA: Diagnosis not present

## 2016-02-15 DIAGNOSIS — D509 Iron deficiency anemia, unspecified: Secondary | ICD-10-CM | POA: Diagnosis not present

## 2016-02-15 DIAGNOSIS — N2581 Secondary hyperparathyroidism of renal origin: Secondary | ICD-10-CM | POA: Diagnosis not present

## 2016-02-15 DIAGNOSIS — N186 End stage renal disease: Secondary | ICD-10-CM | POA: Diagnosis not present

## 2016-02-16 DIAGNOSIS — D509 Iron deficiency anemia, unspecified: Secondary | ICD-10-CM | POA: Diagnosis not present

## 2016-02-16 DIAGNOSIS — N2581 Secondary hyperparathyroidism of renal origin: Secondary | ICD-10-CM | POA: Diagnosis not present

## 2016-02-16 DIAGNOSIS — Z5189 Encounter for other specified aftercare: Secondary | ICD-10-CM | POA: Diagnosis not present

## 2016-02-16 DIAGNOSIS — D631 Anemia in chronic kidney disease: Secondary | ICD-10-CM | POA: Diagnosis not present

## 2016-02-16 DIAGNOSIS — K769 Liver disease, unspecified: Secondary | ICD-10-CM | POA: Diagnosis not present

## 2016-02-16 DIAGNOSIS — N186 End stage renal disease: Secondary | ICD-10-CM | POA: Diagnosis not present

## 2016-02-17 DIAGNOSIS — N186 End stage renal disease: Secondary | ICD-10-CM | POA: Diagnosis not present

## 2016-02-17 DIAGNOSIS — K769 Liver disease, unspecified: Secondary | ICD-10-CM | POA: Diagnosis not present

## 2016-02-17 DIAGNOSIS — D509 Iron deficiency anemia, unspecified: Secondary | ICD-10-CM | POA: Diagnosis not present

## 2016-02-17 DIAGNOSIS — Z5189 Encounter for other specified aftercare: Secondary | ICD-10-CM | POA: Diagnosis not present

## 2016-02-17 DIAGNOSIS — N2581 Secondary hyperparathyroidism of renal origin: Secondary | ICD-10-CM | POA: Diagnosis not present

## 2016-02-17 DIAGNOSIS — D631 Anemia in chronic kidney disease: Secondary | ICD-10-CM | POA: Diagnosis not present

## 2016-02-18 DIAGNOSIS — N2581 Secondary hyperparathyroidism of renal origin: Secondary | ICD-10-CM | POA: Diagnosis not present

## 2016-02-18 DIAGNOSIS — K769 Liver disease, unspecified: Secondary | ICD-10-CM | POA: Diagnosis not present

## 2016-02-18 DIAGNOSIS — D509 Iron deficiency anemia, unspecified: Secondary | ICD-10-CM | POA: Diagnosis not present

## 2016-02-18 DIAGNOSIS — D631 Anemia in chronic kidney disease: Secondary | ICD-10-CM | POA: Diagnosis not present

## 2016-02-18 DIAGNOSIS — N186 End stage renal disease: Secondary | ICD-10-CM | POA: Diagnosis not present

## 2016-02-18 DIAGNOSIS — Z5189 Encounter for other specified aftercare: Secondary | ICD-10-CM | POA: Diagnosis not present

## 2016-02-19 DIAGNOSIS — D509 Iron deficiency anemia, unspecified: Secondary | ICD-10-CM | POA: Diagnosis not present

## 2016-02-19 DIAGNOSIS — D631 Anemia in chronic kidney disease: Secondary | ICD-10-CM | POA: Diagnosis not present

## 2016-02-19 DIAGNOSIS — N186 End stage renal disease: Secondary | ICD-10-CM | POA: Diagnosis not present

## 2016-02-19 DIAGNOSIS — Z5189 Encounter for other specified aftercare: Secondary | ICD-10-CM | POA: Diagnosis not present

## 2016-02-19 DIAGNOSIS — N2581 Secondary hyperparathyroidism of renal origin: Secondary | ICD-10-CM | POA: Diagnosis not present

## 2016-02-19 DIAGNOSIS — K769 Liver disease, unspecified: Secondary | ICD-10-CM | POA: Diagnosis not present

## 2016-02-20 DIAGNOSIS — D509 Iron deficiency anemia, unspecified: Secondary | ICD-10-CM | POA: Diagnosis not present

## 2016-02-20 DIAGNOSIS — N186 End stage renal disease: Secondary | ICD-10-CM | POA: Diagnosis not present

## 2016-02-20 DIAGNOSIS — Z5189 Encounter for other specified aftercare: Secondary | ICD-10-CM | POA: Diagnosis not present

## 2016-02-20 DIAGNOSIS — K769 Liver disease, unspecified: Secondary | ICD-10-CM | POA: Diagnosis not present

## 2016-02-20 DIAGNOSIS — D631 Anemia in chronic kidney disease: Secondary | ICD-10-CM | POA: Diagnosis not present

## 2016-02-20 DIAGNOSIS — N2581 Secondary hyperparathyroidism of renal origin: Secondary | ICD-10-CM | POA: Diagnosis not present

## 2016-02-21 DIAGNOSIS — K769 Liver disease, unspecified: Secondary | ICD-10-CM | POA: Diagnosis not present

## 2016-02-21 DIAGNOSIS — N186 End stage renal disease: Secondary | ICD-10-CM | POA: Diagnosis not present

## 2016-02-21 DIAGNOSIS — Z5189 Encounter for other specified aftercare: Secondary | ICD-10-CM | POA: Diagnosis not present

## 2016-02-21 DIAGNOSIS — D509 Iron deficiency anemia, unspecified: Secondary | ICD-10-CM | POA: Diagnosis not present

## 2016-02-21 DIAGNOSIS — D631 Anemia in chronic kidney disease: Secondary | ICD-10-CM | POA: Diagnosis not present

## 2016-02-21 DIAGNOSIS — N2581 Secondary hyperparathyroidism of renal origin: Secondary | ICD-10-CM | POA: Diagnosis not present

## 2016-02-22 DIAGNOSIS — N2581 Secondary hyperparathyroidism of renal origin: Secondary | ICD-10-CM | POA: Diagnosis not present

## 2016-02-22 DIAGNOSIS — D631 Anemia in chronic kidney disease: Secondary | ICD-10-CM | POA: Diagnosis not present

## 2016-02-22 DIAGNOSIS — N186 End stage renal disease: Secondary | ICD-10-CM | POA: Diagnosis not present

## 2016-02-22 DIAGNOSIS — K769 Liver disease, unspecified: Secondary | ICD-10-CM | POA: Diagnosis not present

## 2016-02-22 DIAGNOSIS — Z5189 Encounter for other specified aftercare: Secondary | ICD-10-CM | POA: Diagnosis not present

## 2016-02-22 DIAGNOSIS — D509 Iron deficiency anemia, unspecified: Secondary | ICD-10-CM | POA: Diagnosis not present

## 2016-02-23 DIAGNOSIS — Z5189 Encounter for other specified aftercare: Secondary | ICD-10-CM | POA: Diagnosis not present

## 2016-02-23 DIAGNOSIS — D631 Anemia in chronic kidney disease: Secondary | ICD-10-CM | POA: Diagnosis not present

## 2016-02-23 DIAGNOSIS — N186 End stage renal disease: Secondary | ICD-10-CM | POA: Diagnosis not present

## 2016-02-23 DIAGNOSIS — D509 Iron deficiency anemia, unspecified: Secondary | ICD-10-CM | POA: Diagnosis not present

## 2016-02-23 DIAGNOSIS — K769 Liver disease, unspecified: Secondary | ICD-10-CM | POA: Diagnosis not present

## 2016-02-23 DIAGNOSIS — N2581 Secondary hyperparathyroidism of renal origin: Secondary | ICD-10-CM | POA: Diagnosis not present

## 2016-02-24 DIAGNOSIS — D631 Anemia in chronic kidney disease: Secondary | ICD-10-CM | POA: Diagnosis not present

## 2016-02-24 DIAGNOSIS — K769 Liver disease, unspecified: Secondary | ICD-10-CM | POA: Diagnosis not present

## 2016-02-24 DIAGNOSIS — Z5189 Encounter for other specified aftercare: Secondary | ICD-10-CM | POA: Diagnosis not present

## 2016-02-24 DIAGNOSIS — N186 End stage renal disease: Secondary | ICD-10-CM | POA: Diagnosis not present

## 2016-02-24 DIAGNOSIS — N2581 Secondary hyperparathyroidism of renal origin: Secondary | ICD-10-CM | POA: Diagnosis not present

## 2016-02-24 DIAGNOSIS — D509 Iron deficiency anemia, unspecified: Secondary | ICD-10-CM | POA: Diagnosis not present

## 2016-02-25 DIAGNOSIS — N2581 Secondary hyperparathyroidism of renal origin: Secondary | ICD-10-CM | POA: Diagnosis not present

## 2016-02-25 DIAGNOSIS — D509 Iron deficiency anemia, unspecified: Secondary | ICD-10-CM | POA: Diagnosis not present

## 2016-02-25 DIAGNOSIS — N186 End stage renal disease: Secondary | ICD-10-CM | POA: Diagnosis not present

## 2016-02-25 DIAGNOSIS — Z5189 Encounter for other specified aftercare: Secondary | ICD-10-CM | POA: Diagnosis not present

## 2016-02-25 DIAGNOSIS — I129 Hypertensive chronic kidney disease with stage 1 through stage 4 chronic kidney disease, or unspecified chronic kidney disease: Secondary | ICD-10-CM | POA: Diagnosis not present

## 2016-02-25 DIAGNOSIS — K769 Liver disease, unspecified: Secondary | ICD-10-CM | POA: Diagnosis not present

## 2016-02-25 DIAGNOSIS — Z992 Dependence on renal dialysis: Secondary | ICD-10-CM | POA: Diagnosis not present

## 2016-02-25 DIAGNOSIS — D631 Anemia in chronic kidney disease: Secondary | ICD-10-CM | POA: Diagnosis not present

## 2016-02-26 DIAGNOSIS — N2589 Other disorders resulting from impaired renal tubular function: Secondary | ICD-10-CM | POA: Diagnosis not present

## 2016-02-26 DIAGNOSIS — K769 Liver disease, unspecified: Secondary | ICD-10-CM | POA: Diagnosis not present

## 2016-02-26 DIAGNOSIS — N2581 Secondary hyperparathyroidism of renal origin: Secondary | ICD-10-CM | POA: Diagnosis not present

## 2016-02-26 DIAGNOSIS — D631 Anemia in chronic kidney disease: Secondary | ICD-10-CM | POA: Diagnosis not present

## 2016-02-26 DIAGNOSIS — D509 Iron deficiency anemia, unspecified: Secondary | ICD-10-CM | POA: Diagnosis not present

## 2016-02-26 DIAGNOSIS — N186 End stage renal disease: Secondary | ICD-10-CM | POA: Diagnosis not present

## 2016-02-27 DIAGNOSIS — N2581 Secondary hyperparathyroidism of renal origin: Secondary | ICD-10-CM | POA: Diagnosis not present

## 2016-02-27 DIAGNOSIS — D509 Iron deficiency anemia, unspecified: Secondary | ICD-10-CM | POA: Diagnosis not present

## 2016-02-27 DIAGNOSIS — K769 Liver disease, unspecified: Secondary | ICD-10-CM | POA: Diagnosis not present

## 2016-02-27 DIAGNOSIS — N2589 Other disorders resulting from impaired renal tubular function: Secondary | ICD-10-CM | POA: Diagnosis not present

## 2016-02-27 DIAGNOSIS — D631 Anemia in chronic kidney disease: Secondary | ICD-10-CM | POA: Diagnosis not present

## 2016-02-27 DIAGNOSIS — N186 End stage renal disease: Secondary | ICD-10-CM | POA: Diagnosis not present

## 2016-02-28 DIAGNOSIS — N2581 Secondary hyperparathyroidism of renal origin: Secondary | ICD-10-CM | POA: Diagnosis not present

## 2016-02-28 DIAGNOSIS — D509 Iron deficiency anemia, unspecified: Secondary | ICD-10-CM | POA: Diagnosis not present

## 2016-02-28 DIAGNOSIS — N186 End stage renal disease: Secondary | ICD-10-CM | POA: Diagnosis not present

## 2016-02-28 DIAGNOSIS — N2589 Other disorders resulting from impaired renal tubular function: Secondary | ICD-10-CM | POA: Diagnosis not present

## 2016-02-28 DIAGNOSIS — K769 Liver disease, unspecified: Secondary | ICD-10-CM | POA: Diagnosis not present

## 2016-02-28 DIAGNOSIS — D631 Anemia in chronic kidney disease: Secondary | ICD-10-CM | POA: Diagnosis not present

## 2016-02-29 DIAGNOSIS — K769 Liver disease, unspecified: Secondary | ICD-10-CM | POA: Diagnosis not present

## 2016-02-29 DIAGNOSIS — N186 End stage renal disease: Secondary | ICD-10-CM | POA: Diagnosis not present

## 2016-02-29 DIAGNOSIS — D631 Anemia in chronic kidney disease: Secondary | ICD-10-CM | POA: Diagnosis not present

## 2016-02-29 DIAGNOSIS — D509 Iron deficiency anemia, unspecified: Secondary | ICD-10-CM | POA: Diagnosis not present

## 2016-02-29 DIAGNOSIS — N2581 Secondary hyperparathyroidism of renal origin: Secondary | ICD-10-CM | POA: Diagnosis not present

## 2016-02-29 DIAGNOSIS — N2589 Other disorders resulting from impaired renal tubular function: Secondary | ICD-10-CM | POA: Diagnosis not present

## 2016-03-01 DIAGNOSIS — N2581 Secondary hyperparathyroidism of renal origin: Secondary | ICD-10-CM | POA: Diagnosis not present

## 2016-03-01 DIAGNOSIS — D509 Iron deficiency anemia, unspecified: Secondary | ICD-10-CM | POA: Diagnosis not present

## 2016-03-01 DIAGNOSIS — N186 End stage renal disease: Secondary | ICD-10-CM | POA: Diagnosis not present

## 2016-03-01 DIAGNOSIS — N2589 Other disorders resulting from impaired renal tubular function: Secondary | ICD-10-CM | POA: Diagnosis not present

## 2016-03-01 DIAGNOSIS — K769 Liver disease, unspecified: Secondary | ICD-10-CM | POA: Diagnosis not present

## 2016-03-01 DIAGNOSIS — D631 Anemia in chronic kidney disease: Secondary | ICD-10-CM | POA: Diagnosis not present

## 2016-03-02 DIAGNOSIS — N186 End stage renal disease: Secondary | ICD-10-CM | POA: Diagnosis not present

## 2016-03-02 DIAGNOSIS — K769 Liver disease, unspecified: Secondary | ICD-10-CM | POA: Diagnosis not present

## 2016-03-02 DIAGNOSIS — N2589 Other disorders resulting from impaired renal tubular function: Secondary | ICD-10-CM | POA: Diagnosis not present

## 2016-03-02 DIAGNOSIS — D509 Iron deficiency anemia, unspecified: Secondary | ICD-10-CM | POA: Diagnosis not present

## 2016-03-02 DIAGNOSIS — E784 Other hyperlipidemia: Secondary | ICD-10-CM | POA: Diagnosis not present

## 2016-03-02 DIAGNOSIS — D631 Anemia in chronic kidney disease: Secondary | ICD-10-CM | POA: Diagnosis not present

## 2016-03-02 DIAGNOSIS — N2581 Secondary hyperparathyroidism of renal origin: Secondary | ICD-10-CM | POA: Diagnosis not present

## 2016-03-02 DIAGNOSIS — R8299 Other abnormal findings in urine: Secondary | ICD-10-CM | POA: Diagnosis not present

## 2016-03-02 DIAGNOSIS — E1129 Type 2 diabetes mellitus with other diabetic kidney complication: Secondary | ICD-10-CM | POA: Diagnosis not present

## 2016-03-03 DIAGNOSIS — K769 Liver disease, unspecified: Secondary | ICD-10-CM | POA: Diagnosis not present

## 2016-03-03 DIAGNOSIS — D509 Iron deficiency anemia, unspecified: Secondary | ICD-10-CM | POA: Diagnosis not present

## 2016-03-03 DIAGNOSIS — D631 Anemia in chronic kidney disease: Secondary | ICD-10-CM | POA: Diagnosis not present

## 2016-03-03 DIAGNOSIS — N186 End stage renal disease: Secondary | ICD-10-CM | POA: Diagnosis not present

## 2016-03-03 DIAGNOSIS — N2589 Other disorders resulting from impaired renal tubular function: Secondary | ICD-10-CM | POA: Diagnosis not present

## 2016-03-03 DIAGNOSIS — N2581 Secondary hyperparathyroidism of renal origin: Secondary | ICD-10-CM | POA: Diagnosis not present

## 2016-03-04 DIAGNOSIS — K769 Liver disease, unspecified: Secondary | ICD-10-CM | POA: Diagnosis not present

## 2016-03-04 DIAGNOSIS — N2581 Secondary hyperparathyroidism of renal origin: Secondary | ICD-10-CM | POA: Diagnosis not present

## 2016-03-04 DIAGNOSIS — D509 Iron deficiency anemia, unspecified: Secondary | ICD-10-CM | POA: Diagnosis not present

## 2016-03-04 DIAGNOSIS — D631 Anemia in chronic kidney disease: Secondary | ICD-10-CM | POA: Diagnosis not present

## 2016-03-04 DIAGNOSIS — N2589 Other disorders resulting from impaired renal tubular function: Secondary | ICD-10-CM | POA: Diagnosis not present

## 2016-03-04 DIAGNOSIS — N186 End stage renal disease: Secondary | ICD-10-CM | POA: Diagnosis not present

## 2016-03-05 DIAGNOSIS — N2581 Secondary hyperparathyroidism of renal origin: Secondary | ICD-10-CM | POA: Diagnosis not present

## 2016-03-05 DIAGNOSIS — K769 Liver disease, unspecified: Secondary | ICD-10-CM | POA: Diagnosis not present

## 2016-03-05 DIAGNOSIS — N186 End stage renal disease: Secondary | ICD-10-CM | POA: Diagnosis not present

## 2016-03-05 DIAGNOSIS — D631 Anemia in chronic kidney disease: Secondary | ICD-10-CM | POA: Diagnosis not present

## 2016-03-05 DIAGNOSIS — N2589 Other disorders resulting from impaired renal tubular function: Secondary | ICD-10-CM | POA: Diagnosis not present

## 2016-03-05 DIAGNOSIS — D509 Iron deficiency anemia, unspecified: Secondary | ICD-10-CM | POA: Diagnosis not present

## 2016-03-06 DIAGNOSIS — N186 End stage renal disease: Secondary | ICD-10-CM | POA: Diagnosis not present

## 2016-03-06 DIAGNOSIS — N2581 Secondary hyperparathyroidism of renal origin: Secondary | ICD-10-CM | POA: Diagnosis not present

## 2016-03-06 DIAGNOSIS — D631 Anemia in chronic kidney disease: Secondary | ICD-10-CM | POA: Diagnosis not present

## 2016-03-06 DIAGNOSIS — D509 Iron deficiency anemia, unspecified: Secondary | ICD-10-CM | POA: Diagnosis not present

## 2016-03-06 DIAGNOSIS — K769 Liver disease, unspecified: Secondary | ICD-10-CM | POA: Diagnosis not present

## 2016-03-06 DIAGNOSIS — N2589 Other disorders resulting from impaired renal tubular function: Secondary | ICD-10-CM | POA: Diagnosis not present

## 2016-03-07 DIAGNOSIS — N186 End stage renal disease: Secondary | ICD-10-CM | POA: Diagnosis not present

## 2016-03-07 DIAGNOSIS — D509 Iron deficiency anemia, unspecified: Secondary | ICD-10-CM | POA: Diagnosis not present

## 2016-03-07 DIAGNOSIS — N2581 Secondary hyperparathyroidism of renal origin: Secondary | ICD-10-CM | POA: Diagnosis not present

## 2016-03-07 DIAGNOSIS — K769 Liver disease, unspecified: Secondary | ICD-10-CM | POA: Diagnosis not present

## 2016-03-07 DIAGNOSIS — D631 Anemia in chronic kidney disease: Secondary | ICD-10-CM | POA: Diagnosis not present

## 2016-03-07 DIAGNOSIS — N2589 Other disorders resulting from impaired renal tubular function: Secondary | ICD-10-CM | POA: Diagnosis not present

## 2016-03-08 DIAGNOSIS — N2589 Other disorders resulting from impaired renal tubular function: Secondary | ICD-10-CM | POA: Diagnosis not present

## 2016-03-08 DIAGNOSIS — K769 Liver disease, unspecified: Secondary | ICD-10-CM | POA: Diagnosis not present

## 2016-03-08 DIAGNOSIS — N2581 Secondary hyperparathyroidism of renal origin: Secondary | ICD-10-CM | POA: Diagnosis not present

## 2016-03-08 DIAGNOSIS — D509 Iron deficiency anemia, unspecified: Secondary | ICD-10-CM | POA: Diagnosis not present

## 2016-03-08 DIAGNOSIS — D631 Anemia in chronic kidney disease: Secondary | ICD-10-CM | POA: Diagnosis not present

## 2016-03-08 DIAGNOSIS — N186 End stage renal disease: Secondary | ICD-10-CM | POA: Diagnosis not present

## 2016-03-09 DIAGNOSIS — N2581 Secondary hyperparathyroidism of renal origin: Secondary | ICD-10-CM | POA: Diagnosis not present

## 2016-03-09 DIAGNOSIS — D509 Iron deficiency anemia, unspecified: Secondary | ICD-10-CM | POA: Diagnosis not present

## 2016-03-09 DIAGNOSIS — D631 Anemia in chronic kidney disease: Secondary | ICD-10-CM | POA: Diagnosis not present

## 2016-03-09 DIAGNOSIS — K769 Liver disease, unspecified: Secondary | ICD-10-CM | POA: Diagnosis not present

## 2016-03-09 DIAGNOSIS — N2589 Other disorders resulting from impaired renal tubular function: Secondary | ICD-10-CM | POA: Diagnosis not present

## 2016-03-09 DIAGNOSIS — N186 End stage renal disease: Secondary | ICD-10-CM | POA: Diagnosis not present

## 2016-03-10 DIAGNOSIS — N2581 Secondary hyperparathyroidism of renal origin: Secondary | ICD-10-CM | POA: Diagnosis not present

## 2016-03-10 DIAGNOSIS — N186 End stage renal disease: Secondary | ICD-10-CM | POA: Diagnosis not present

## 2016-03-10 DIAGNOSIS — K769 Liver disease, unspecified: Secondary | ICD-10-CM | POA: Diagnosis not present

## 2016-03-10 DIAGNOSIS — D631 Anemia in chronic kidney disease: Secondary | ICD-10-CM | POA: Diagnosis not present

## 2016-03-10 DIAGNOSIS — N2589 Other disorders resulting from impaired renal tubular function: Secondary | ICD-10-CM | POA: Diagnosis not present

## 2016-03-10 DIAGNOSIS — D509 Iron deficiency anemia, unspecified: Secondary | ICD-10-CM | POA: Diagnosis not present

## 2016-03-11 DIAGNOSIS — D509 Iron deficiency anemia, unspecified: Secondary | ICD-10-CM | POA: Diagnosis not present

## 2016-03-11 DIAGNOSIS — N186 End stage renal disease: Secondary | ICD-10-CM | POA: Diagnosis not present

## 2016-03-11 DIAGNOSIS — N2581 Secondary hyperparathyroidism of renal origin: Secondary | ICD-10-CM | POA: Diagnosis not present

## 2016-03-11 DIAGNOSIS — N2589 Other disorders resulting from impaired renal tubular function: Secondary | ICD-10-CM | POA: Diagnosis not present

## 2016-03-11 DIAGNOSIS — D631 Anemia in chronic kidney disease: Secondary | ICD-10-CM | POA: Diagnosis not present

## 2016-03-11 DIAGNOSIS — K769 Liver disease, unspecified: Secondary | ICD-10-CM | POA: Diagnosis not present

## 2016-03-12 DIAGNOSIS — N2581 Secondary hyperparathyroidism of renal origin: Secondary | ICD-10-CM | POA: Diagnosis not present

## 2016-03-12 DIAGNOSIS — D631 Anemia in chronic kidney disease: Secondary | ICD-10-CM | POA: Diagnosis not present

## 2016-03-12 DIAGNOSIS — D509 Iron deficiency anemia, unspecified: Secondary | ICD-10-CM | POA: Diagnosis not present

## 2016-03-12 DIAGNOSIS — N2589 Other disorders resulting from impaired renal tubular function: Secondary | ICD-10-CM | POA: Diagnosis not present

## 2016-03-12 DIAGNOSIS — N186 End stage renal disease: Secondary | ICD-10-CM | POA: Diagnosis not present

## 2016-03-12 DIAGNOSIS — K769 Liver disease, unspecified: Secondary | ICD-10-CM | POA: Diagnosis not present

## 2016-03-13 DIAGNOSIS — K769 Liver disease, unspecified: Secondary | ICD-10-CM | POA: Diagnosis not present

## 2016-03-13 DIAGNOSIS — N2589 Other disorders resulting from impaired renal tubular function: Secondary | ICD-10-CM | POA: Diagnosis not present

## 2016-03-13 DIAGNOSIS — N186 End stage renal disease: Secondary | ICD-10-CM | POA: Diagnosis not present

## 2016-03-13 DIAGNOSIS — N2581 Secondary hyperparathyroidism of renal origin: Secondary | ICD-10-CM | POA: Diagnosis not present

## 2016-03-13 DIAGNOSIS — D509 Iron deficiency anemia, unspecified: Secondary | ICD-10-CM | POA: Diagnosis not present

## 2016-03-13 DIAGNOSIS — D631 Anemia in chronic kidney disease: Secondary | ICD-10-CM | POA: Diagnosis not present

## 2016-03-14 DIAGNOSIS — D509 Iron deficiency anemia, unspecified: Secondary | ICD-10-CM | POA: Diagnosis not present

## 2016-03-14 DIAGNOSIS — N186 End stage renal disease: Secondary | ICD-10-CM | POA: Diagnosis not present

## 2016-03-14 DIAGNOSIS — K769 Liver disease, unspecified: Secondary | ICD-10-CM | POA: Diagnosis not present

## 2016-03-14 DIAGNOSIS — N2589 Other disorders resulting from impaired renal tubular function: Secondary | ICD-10-CM | POA: Diagnosis not present

## 2016-03-14 DIAGNOSIS — D631 Anemia in chronic kidney disease: Secondary | ICD-10-CM | POA: Diagnosis not present

## 2016-03-14 DIAGNOSIS — N2581 Secondary hyperparathyroidism of renal origin: Secondary | ICD-10-CM | POA: Diagnosis not present

## 2016-03-15 DIAGNOSIS — N2581 Secondary hyperparathyroidism of renal origin: Secondary | ICD-10-CM | POA: Diagnosis not present

## 2016-03-15 DIAGNOSIS — K769 Liver disease, unspecified: Secondary | ICD-10-CM | POA: Diagnosis not present

## 2016-03-15 DIAGNOSIS — N186 End stage renal disease: Secondary | ICD-10-CM | POA: Diagnosis not present

## 2016-03-15 DIAGNOSIS — D509 Iron deficiency anemia, unspecified: Secondary | ICD-10-CM | POA: Diagnosis not present

## 2016-03-15 DIAGNOSIS — D631 Anemia in chronic kidney disease: Secondary | ICD-10-CM | POA: Diagnosis not present

## 2016-03-15 DIAGNOSIS — N2589 Other disorders resulting from impaired renal tubular function: Secondary | ICD-10-CM | POA: Diagnosis not present

## 2016-03-16 DIAGNOSIS — N2581 Secondary hyperparathyroidism of renal origin: Secondary | ICD-10-CM | POA: Diagnosis not present

## 2016-03-16 DIAGNOSIS — N2589 Other disorders resulting from impaired renal tubular function: Secondary | ICD-10-CM | POA: Diagnosis not present

## 2016-03-16 DIAGNOSIS — D631 Anemia in chronic kidney disease: Secondary | ICD-10-CM | POA: Diagnosis not present

## 2016-03-16 DIAGNOSIS — N186 End stage renal disease: Secondary | ICD-10-CM | POA: Diagnosis not present

## 2016-03-16 DIAGNOSIS — D509 Iron deficiency anemia, unspecified: Secondary | ICD-10-CM | POA: Diagnosis not present

## 2016-03-16 DIAGNOSIS — K769 Liver disease, unspecified: Secondary | ICD-10-CM | POA: Diagnosis not present

## 2016-03-17 DIAGNOSIS — N186 End stage renal disease: Secondary | ICD-10-CM | POA: Diagnosis not present

## 2016-03-17 DIAGNOSIS — D509 Iron deficiency anemia, unspecified: Secondary | ICD-10-CM | POA: Diagnosis not present

## 2016-03-17 DIAGNOSIS — K769 Liver disease, unspecified: Secondary | ICD-10-CM | POA: Diagnosis not present

## 2016-03-17 DIAGNOSIS — D631 Anemia in chronic kidney disease: Secondary | ICD-10-CM | POA: Diagnosis not present

## 2016-03-17 DIAGNOSIS — N2589 Other disorders resulting from impaired renal tubular function: Secondary | ICD-10-CM | POA: Diagnosis not present

## 2016-03-17 DIAGNOSIS — N2581 Secondary hyperparathyroidism of renal origin: Secondary | ICD-10-CM | POA: Diagnosis not present

## 2016-03-18 DIAGNOSIS — K769 Liver disease, unspecified: Secondary | ICD-10-CM | POA: Diagnosis not present

## 2016-03-18 DIAGNOSIS — D509 Iron deficiency anemia, unspecified: Secondary | ICD-10-CM | POA: Diagnosis not present

## 2016-03-18 DIAGNOSIS — N2581 Secondary hyperparathyroidism of renal origin: Secondary | ICD-10-CM | POA: Diagnosis not present

## 2016-03-18 DIAGNOSIS — N2589 Other disorders resulting from impaired renal tubular function: Secondary | ICD-10-CM | POA: Diagnosis not present

## 2016-03-18 DIAGNOSIS — N186 End stage renal disease: Secondary | ICD-10-CM | POA: Diagnosis not present

## 2016-03-18 DIAGNOSIS — D631 Anemia in chronic kidney disease: Secondary | ICD-10-CM | POA: Diagnosis not present

## 2016-03-19 DIAGNOSIS — D509 Iron deficiency anemia, unspecified: Secondary | ICD-10-CM | POA: Diagnosis not present

## 2016-03-19 DIAGNOSIS — K769 Liver disease, unspecified: Secondary | ICD-10-CM | POA: Diagnosis not present

## 2016-03-19 DIAGNOSIS — N2589 Other disorders resulting from impaired renal tubular function: Secondary | ICD-10-CM | POA: Diagnosis not present

## 2016-03-19 DIAGNOSIS — N2581 Secondary hyperparathyroidism of renal origin: Secondary | ICD-10-CM | POA: Diagnosis not present

## 2016-03-19 DIAGNOSIS — D631 Anemia in chronic kidney disease: Secondary | ICD-10-CM | POA: Diagnosis not present

## 2016-03-19 DIAGNOSIS — N186 End stage renal disease: Secondary | ICD-10-CM | POA: Diagnosis not present

## 2016-03-20 DIAGNOSIS — N186 End stage renal disease: Secondary | ICD-10-CM | POA: Diagnosis not present

## 2016-03-20 DIAGNOSIS — N2589 Other disorders resulting from impaired renal tubular function: Secondary | ICD-10-CM | POA: Diagnosis not present

## 2016-03-20 DIAGNOSIS — D631 Anemia in chronic kidney disease: Secondary | ICD-10-CM | POA: Diagnosis not present

## 2016-03-20 DIAGNOSIS — D509 Iron deficiency anemia, unspecified: Secondary | ICD-10-CM | POA: Diagnosis not present

## 2016-03-20 DIAGNOSIS — N2581 Secondary hyperparathyroidism of renal origin: Secondary | ICD-10-CM | POA: Diagnosis not present

## 2016-03-20 DIAGNOSIS — K769 Liver disease, unspecified: Secondary | ICD-10-CM | POA: Diagnosis not present

## 2016-03-21 DIAGNOSIS — N2581 Secondary hyperparathyroidism of renal origin: Secondary | ICD-10-CM | POA: Diagnosis not present

## 2016-03-21 DIAGNOSIS — D631 Anemia in chronic kidney disease: Secondary | ICD-10-CM | POA: Diagnosis not present

## 2016-03-21 DIAGNOSIS — N186 End stage renal disease: Secondary | ICD-10-CM | POA: Diagnosis not present

## 2016-03-21 DIAGNOSIS — N2589 Other disorders resulting from impaired renal tubular function: Secondary | ICD-10-CM | POA: Diagnosis not present

## 2016-03-21 DIAGNOSIS — D509 Iron deficiency anemia, unspecified: Secondary | ICD-10-CM | POA: Diagnosis not present

## 2016-03-21 DIAGNOSIS — K769 Liver disease, unspecified: Secondary | ICD-10-CM | POA: Diagnosis not present

## 2016-03-22 DIAGNOSIS — N2581 Secondary hyperparathyroidism of renal origin: Secondary | ICD-10-CM | POA: Diagnosis not present

## 2016-03-22 DIAGNOSIS — N2589 Other disorders resulting from impaired renal tubular function: Secondary | ICD-10-CM | POA: Diagnosis not present

## 2016-03-22 DIAGNOSIS — N186 End stage renal disease: Secondary | ICD-10-CM | POA: Diagnosis not present

## 2016-03-22 DIAGNOSIS — D509 Iron deficiency anemia, unspecified: Secondary | ICD-10-CM | POA: Diagnosis not present

## 2016-03-22 DIAGNOSIS — D631 Anemia in chronic kidney disease: Secondary | ICD-10-CM | POA: Diagnosis not present

## 2016-03-22 DIAGNOSIS — K769 Liver disease, unspecified: Secondary | ICD-10-CM | POA: Diagnosis not present

## 2016-03-23 DIAGNOSIS — N2589 Other disorders resulting from impaired renal tubular function: Secondary | ICD-10-CM | POA: Diagnosis not present

## 2016-03-23 DIAGNOSIS — N2581 Secondary hyperparathyroidism of renal origin: Secondary | ICD-10-CM | POA: Diagnosis not present

## 2016-03-23 DIAGNOSIS — D631 Anemia in chronic kidney disease: Secondary | ICD-10-CM | POA: Diagnosis not present

## 2016-03-23 DIAGNOSIS — D509 Iron deficiency anemia, unspecified: Secondary | ICD-10-CM | POA: Diagnosis not present

## 2016-03-23 DIAGNOSIS — N186 End stage renal disease: Secondary | ICD-10-CM | POA: Diagnosis not present

## 2016-03-23 DIAGNOSIS — K769 Liver disease, unspecified: Secondary | ICD-10-CM | POA: Diagnosis not present

## 2016-03-24 DIAGNOSIS — D631 Anemia in chronic kidney disease: Secondary | ICD-10-CM | POA: Diagnosis not present

## 2016-03-24 DIAGNOSIS — N2581 Secondary hyperparathyroidism of renal origin: Secondary | ICD-10-CM | POA: Diagnosis not present

## 2016-03-24 DIAGNOSIS — D509 Iron deficiency anemia, unspecified: Secondary | ICD-10-CM | POA: Diagnosis not present

## 2016-03-24 DIAGNOSIS — N2589 Other disorders resulting from impaired renal tubular function: Secondary | ICD-10-CM | POA: Diagnosis not present

## 2016-03-24 DIAGNOSIS — K769 Liver disease, unspecified: Secondary | ICD-10-CM | POA: Diagnosis not present

## 2016-03-24 DIAGNOSIS — N186 End stage renal disease: Secondary | ICD-10-CM | POA: Diagnosis not present

## 2016-03-25 DIAGNOSIS — D509 Iron deficiency anemia, unspecified: Secondary | ICD-10-CM | POA: Diagnosis not present

## 2016-03-25 DIAGNOSIS — K769 Liver disease, unspecified: Secondary | ICD-10-CM | POA: Diagnosis not present

## 2016-03-25 DIAGNOSIS — N186 End stage renal disease: Secondary | ICD-10-CM | POA: Diagnosis not present

## 2016-03-25 DIAGNOSIS — N2581 Secondary hyperparathyroidism of renal origin: Secondary | ICD-10-CM | POA: Diagnosis not present

## 2016-03-25 DIAGNOSIS — N2589 Other disorders resulting from impaired renal tubular function: Secondary | ICD-10-CM | POA: Diagnosis not present

## 2016-03-25 DIAGNOSIS — D631 Anemia in chronic kidney disease: Secondary | ICD-10-CM | POA: Diagnosis not present

## 2016-03-26 DIAGNOSIS — I129 Hypertensive chronic kidney disease with stage 1 through stage 4 chronic kidney disease, or unspecified chronic kidney disease: Secondary | ICD-10-CM | POA: Diagnosis not present

## 2016-03-26 DIAGNOSIS — N2589 Other disorders resulting from impaired renal tubular function: Secondary | ICD-10-CM | POA: Diagnosis not present

## 2016-03-26 DIAGNOSIS — K769 Liver disease, unspecified: Secondary | ICD-10-CM | POA: Diagnosis not present

## 2016-03-26 DIAGNOSIS — N2581 Secondary hyperparathyroidism of renal origin: Secondary | ICD-10-CM | POA: Diagnosis not present

## 2016-03-26 DIAGNOSIS — D631 Anemia in chronic kidney disease: Secondary | ICD-10-CM | POA: Diagnosis not present

## 2016-03-26 DIAGNOSIS — D509 Iron deficiency anemia, unspecified: Secondary | ICD-10-CM | POA: Diagnosis not present

## 2016-03-26 DIAGNOSIS — Z992 Dependence on renal dialysis: Secondary | ICD-10-CM | POA: Diagnosis not present

## 2016-03-26 DIAGNOSIS — N186 End stage renal disease: Secondary | ICD-10-CM | POA: Diagnosis not present

## 2016-03-27 DIAGNOSIS — Z79899 Other long term (current) drug therapy: Secondary | ICD-10-CM | POA: Diagnosis not present

## 2016-03-27 DIAGNOSIS — E440 Moderate protein-calorie malnutrition: Secondary | ICD-10-CM | POA: Diagnosis not present

## 2016-03-27 DIAGNOSIS — N2581 Secondary hyperparathyroidism of renal origin: Secondary | ICD-10-CM | POA: Diagnosis not present

## 2016-03-27 DIAGNOSIS — K769 Liver disease, unspecified: Secondary | ICD-10-CM | POA: Diagnosis not present

## 2016-03-27 DIAGNOSIS — Z5189 Encounter for other specified aftercare: Secondary | ICD-10-CM | POA: Diagnosis not present

## 2016-03-27 DIAGNOSIS — N186 End stage renal disease: Secondary | ICD-10-CM | POA: Diagnosis not present

## 2016-03-27 DIAGNOSIS — D631 Anemia in chronic kidney disease: Secondary | ICD-10-CM | POA: Diagnosis not present

## 2016-03-27 DIAGNOSIS — D509 Iron deficiency anemia, unspecified: Secondary | ICD-10-CM | POA: Diagnosis not present

## 2016-03-28 DIAGNOSIS — D631 Anemia in chronic kidney disease: Secondary | ICD-10-CM | POA: Diagnosis not present

## 2016-03-28 DIAGNOSIS — Z5189 Encounter for other specified aftercare: Secondary | ICD-10-CM | POA: Diagnosis not present

## 2016-03-28 DIAGNOSIS — N186 End stage renal disease: Secondary | ICD-10-CM | POA: Diagnosis not present

## 2016-03-28 DIAGNOSIS — D509 Iron deficiency anemia, unspecified: Secondary | ICD-10-CM | POA: Diagnosis not present

## 2016-03-28 DIAGNOSIS — R8299 Other abnormal findings in urine: Secondary | ICD-10-CM | POA: Diagnosis not present

## 2016-03-28 DIAGNOSIS — Z79899 Other long term (current) drug therapy: Secondary | ICD-10-CM | POA: Diagnosis not present

## 2016-03-28 DIAGNOSIS — E440 Moderate protein-calorie malnutrition: Secondary | ICD-10-CM | POA: Diagnosis not present

## 2016-03-29 DIAGNOSIS — D509 Iron deficiency anemia, unspecified: Secondary | ICD-10-CM | POA: Diagnosis not present

## 2016-03-29 DIAGNOSIS — N186 End stage renal disease: Secondary | ICD-10-CM | POA: Diagnosis not present

## 2016-03-29 DIAGNOSIS — D631 Anemia in chronic kidney disease: Secondary | ICD-10-CM | POA: Diagnosis not present

## 2016-03-29 DIAGNOSIS — Z5189 Encounter for other specified aftercare: Secondary | ICD-10-CM | POA: Diagnosis not present

## 2016-03-29 DIAGNOSIS — E440 Moderate protein-calorie malnutrition: Secondary | ICD-10-CM | POA: Diagnosis not present

## 2016-03-29 DIAGNOSIS — Z79899 Other long term (current) drug therapy: Secondary | ICD-10-CM | POA: Diagnosis not present

## 2016-03-30 DIAGNOSIS — D631 Anemia in chronic kidney disease: Secondary | ICD-10-CM | POA: Diagnosis not present

## 2016-03-30 DIAGNOSIS — Z79899 Other long term (current) drug therapy: Secondary | ICD-10-CM | POA: Diagnosis not present

## 2016-03-30 DIAGNOSIS — Z5189 Encounter for other specified aftercare: Secondary | ICD-10-CM | POA: Diagnosis not present

## 2016-03-30 DIAGNOSIS — N186 End stage renal disease: Secondary | ICD-10-CM | POA: Diagnosis not present

## 2016-03-30 DIAGNOSIS — D509 Iron deficiency anemia, unspecified: Secondary | ICD-10-CM | POA: Diagnosis not present

## 2016-03-30 DIAGNOSIS — E440 Moderate protein-calorie malnutrition: Secondary | ICD-10-CM | POA: Diagnosis not present

## 2016-03-31 DIAGNOSIS — D509 Iron deficiency anemia, unspecified: Secondary | ICD-10-CM | POA: Diagnosis not present

## 2016-03-31 DIAGNOSIS — Z5189 Encounter for other specified aftercare: Secondary | ICD-10-CM | POA: Diagnosis not present

## 2016-03-31 DIAGNOSIS — N186 End stage renal disease: Secondary | ICD-10-CM | POA: Diagnosis not present

## 2016-03-31 DIAGNOSIS — E440 Moderate protein-calorie malnutrition: Secondary | ICD-10-CM | POA: Diagnosis not present

## 2016-03-31 DIAGNOSIS — Z79899 Other long term (current) drug therapy: Secondary | ICD-10-CM | POA: Diagnosis not present

## 2016-03-31 DIAGNOSIS — D631 Anemia in chronic kidney disease: Secondary | ICD-10-CM | POA: Diagnosis not present

## 2016-04-01 DIAGNOSIS — N186 End stage renal disease: Secondary | ICD-10-CM | POA: Diagnosis not present

## 2016-04-01 DIAGNOSIS — Z5189 Encounter for other specified aftercare: Secondary | ICD-10-CM | POA: Diagnosis not present

## 2016-04-01 DIAGNOSIS — D509 Iron deficiency anemia, unspecified: Secondary | ICD-10-CM | POA: Diagnosis not present

## 2016-04-01 DIAGNOSIS — D631 Anemia in chronic kidney disease: Secondary | ICD-10-CM | POA: Diagnosis not present

## 2016-04-01 DIAGNOSIS — E440 Moderate protein-calorie malnutrition: Secondary | ICD-10-CM | POA: Diagnosis not present

## 2016-04-01 DIAGNOSIS — Z79899 Other long term (current) drug therapy: Secondary | ICD-10-CM | POA: Diagnosis not present

## 2016-04-02 DIAGNOSIS — D509 Iron deficiency anemia, unspecified: Secondary | ICD-10-CM | POA: Diagnosis not present

## 2016-04-02 DIAGNOSIS — D631 Anemia in chronic kidney disease: Secondary | ICD-10-CM | POA: Diagnosis not present

## 2016-04-02 DIAGNOSIS — Z5189 Encounter for other specified aftercare: Secondary | ICD-10-CM | POA: Diagnosis not present

## 2016-04-02 DIAGNOSIS — N186 End stage renal disease: Secondary | ICD-10-CM | POA: Diagnosis not present

## 2016-04-02 DIAGNOSIS — Z79899 Other long term (current) drug therapy: Secondary | ICD-10-CM | POA: Diagnosis not present

## 2016-04-02 DIAGNOSIS — E440 Moderate protein-calorie malnutrition: Secondary | ICD-10-CM | POA: Diagnosis not present

## 2016-04-03 DIAGNOSIS — Z79899 Other long term (current) drug therapy: Secondary | ICD-10-CM | POA: Diagnosis not present

## 2016-04-03 DIAGNOSIS — E440 Moderate protein-calorie malnutrition: Secondary | ICD-10-CM | POA: Diagnosis not present

## 2016-04-03 DIAGNOSIS — D631 Anemia in chronic kidney disease: Secondary | ICD-10-CM | POA: Diagnosis not present

## 2016-04-03 DIAGNOSIS — N186 End stage renal disease: Secondary | ICD-10-CM | POA: Diagnosis not present

## 2016-04-03 DIAGNOSIS — D509 Iron deficiency anemia, unspecified: Secondary | ICD-10-CM | POA: Diagnosis not present

## 2016-04-03 DIAGNOSIS — Z5189 Encounter for other specified aftercare: Secondary | ICD-10-CM | POA: Diagnosis not present

## 2016-04-04 DIAGNOSIS — E440 Moderate protein-calorie malnutrition: Secondary | ICD-10-CM | POA: Diagnosis not present

## 2016-04-04 DIAGNOSIS — Z79899 Other long term (current) drug therapy: Secondary | ICD-10-CM | POA: Diagnosis not present

## 2016-04-04 DIAGNOSIS — D509 Iron deficiency anemia, unspecified: Secondary | ICD-10-CM | POA: Diagnosis not present

## 2016-04-04 DIAGNOSIS — Z5189 Encounter for other specified aftercare: Secondary | ICD-10-CM | POA: Diagnosis not present

## 2016-04-04 DIAGNOSIS — N186 End stage renal disease: Secondary | ICD-10-CM | POA: Diagnosis not present

## 2016-04-04 DIAGNOSIS — D631 Anemia in chronic kidney disease: Secondary | ICD-10-CM | POA: Diagnosis not present

## 2016-04-05 DIAGNOSIS — D631 Anemia in chronic kidney disease: Secondary | ICD-10-CM | POA: Diagnosis not present

## 2016-04-05 DIAGNOSIS — D509 Iron deficiency anemia, unspecified: Secondary | ICD-10-CM | POA: Diagnosis not present

## 2016-04-05 DIAGNOSIS — E440 Moderate protein-calorie malnutrition: Secondary | ICD-10-CM | POA: Diagnosis not present

## 2016-04-05 DIAGNOSIS — Z5189 Encounter for other specified aftercare: Secondary | ICD-10-CM | POA: Diagnosis not present

## 2016-04-05 DIAGNOSIS — N186 End stage renal disease: Secondary | ICD-10-CM | POA: Diagnosis not present

## 2016-04-05 DIAGNOSIS — Z79899 Other long term (current) drug therapy: Secondary | ICD-10-CM | POA: Diagnosis not present

## 2016-04-06 DIAGNOSIS — Z5189 Encounter for other specified aftercare: Secondary | ICD-10-CM | POA: Diagnosis not present

## 2016-04-06 DIAGNOSIS — D631 Anemia in chronic kidney disease: Secondary | ICD-10-CM | POA: Diagnosis not present

## 2016-04-06 DIAGNOSIS — D509 Iron deficiency anemia, unspecified: Secondary | ICD-10-CM | POA: Diagnosis not present

## 2016-04-06 DIAGNOSIS — Z79899 Other long term (current) drug therapy: Secondary | ICD-10-CM | POA: Diagnosis not present

## 2016-04-06 DIAGNOSIS — N186 End stage renal disease: Secondary | ICD-10-CM | POA: Diagnosis not present

## 2016-04-06 DIAGNOSIS — E440 Moderate protein-calorie malnutrition: Secondary | ICD-10-CM | POA: Diagnosis not present

## 2016-04-07 DIAGNOSIS — N186 End stage renal disease: Secondary | ICD-10-CM | POA: Diagnosis not present

## 2016-04-07 DIAGNOSIS — D631 Anemia in chronic kidney disease: Secondary | ICD-10-CM | POA: Diagnosis not present

## 2016-04-07 DIAGNOSIS — Z5189 Encounter for other specified aftercare: Secondary | ICD-10-CM | POA: Diagnosis not present

## 2016-04-07 DIAGNOSIS — Z79899 Other long term (current) drug therapy: Secondary | ICD-10-CM | POA: Diagnosis not present

## 2016-04-07 DIAGNOSIS — D509 Iron deficiency anemia, unspecified: Secondary | ICD-10-CM | POA: Diagnosis not present

## 2016-04-07 DIAGNOSIS — E440 Moderate protein-calorie malnutrition: Secondary | ICD-10-CM | POA: Diagnosis not present

## 2016-04-08 DIAGNOSIS — E440 Moderate protein-calorie malnutrition: Secondary | ICD-10-CM | POA: Diagnosis not present

## 2016-04-08 DIAGNOSIS — Z5189 Encounter for other specified aftercare: Secondary | ICD-10-CM | POA: Diagnosis not present

## 2016-04-08 DIAGNOSIS — D509 Iron deficiency anemia, unspecified: Secondary | ICD-10-CM | POA: Diagnosis not present

## 2016-04-08 DIAGNOSIS — D631 Anemia in chronic kidney disease: Secondary | ICD-10-CM | POA: Diagnosis not present

## 2016-04-08 DIAGNOSIS — Z79899 Other long term (current) drug therapy: Secondary | ICD-10-CM | POA: Diagnosis not present

## 2016-04-08 DIAGNOSIS — N186 End stage renal disease: Secondary | ICD-10-CM | POA: Diagnosis not present

## 2016-04-09 DIAGNOSIS — Z79899 Other long term (current) drug therapy: Secondary | ICD-10-CM | POA: Diagnosis not present

## 2016-04-09 DIAGNOSIS — D509 Iron deficiency anemia, unspecified: Secondary | ICD-10-CM | POA: Diagnosis not present

## 2016-04-09 DIAGNOSIS — Z5189 Encounter for other specified aftercare: Secondary | ICD-10-CM | POA: Diagnosis not present

## 2016-04-09 DIAGNOSIS — D631 Anemia in chronic kidney disease: Secondary | ICD-10-CM | POA: Diagnosis not present

## 2016-04-09 DIAGNOSIS — N186 End stage renal disease: Secondary | ICD-10-CM | POA: Diagnosis not present

## 2016-04-09 DIAGNOSIS — E440 Moderate protein-calorie malnutrition: Secondary | ICD-10-CM | POA: Diagnosis not present

## 2016-04-10 DIAGNOSIS — Z79899 Other long term (current) drug therapy: Secondary | ICD-10-CM | POA: Diagnosis not present

## 2016-04-10 DIAGNOSIS — E440 Moderate protein-calorie malnutrition: Secondary | ICD-10-CM | POA: Diagnosis not present

## 2016-04-10 DIAGNOSIS — D631 Anemia in chronic kidney disease: Secondary | ICD-10-CM | POA: Diagnosis not present

## 2016-04-10 DIAGNOSIS — D509 Iron deficiency anemia, unspecified: Secondary | ICD-10-CM | POA: Diagnosis not present

## 2016-04-10 DIAGNOSIS — N186 End stage renal disease: Secondary | ICD-10-CM | POA: Diagnosis not present

## 2016-04-10 DIAGNOSIS — Z5189 Encounter for other specified aftercare: Secondary | ICD-10-CM | POA: Diagnosis not present

## 2016-04-11 DIAGNOSIS — D631 Anemia in chronic kidney disease: Secondary | ICD-10-CM | POA: Diagnosis not present

## 2016-04-11 DIAGNOSIS — E440 Moderate protein-calorie malnutrition: Secondary | ICD-10-CM | POA: Diagnosis not present

## 2016-04-11 DIAGNOSIS — N186 End stage renal disease: Secondary | ICD-10-CM | POA: Diagnosis not present

## 2016-04-11 DIAGNOSIS — D509 Iron deficiency anemia, unspecified: Secondary | ICD-10-CM | POA: Diagnosis not present

## 2016-04-11 DIAGNOSIS — Z79899 Other long term (current) drug therapy: Secondary | ICD-10-CM | POA: Diagnosis not present

## 2016-04-11 DIAGNOSIS — Z5189 Encounter for other specified aftercare: Secondary | ICD-10-CM | POA: Diagnosis not present

## 2016-04-12 DIAGNOSIS — Z5189 Encounter for other specified aftercare: Secondary | ICD-10-CM | POA: Diagnosis not present

## 2016-04-12 DIAGNOSIS — D631 Anemia in chronic kidney disease: Secondary | ICD-10-CM | POA: Diagnosis not present

## 2016-04-12 DIAGNOSIS — Z79899 Other long term (current) drug therapy: Secondary | ICD-10-CM | POA: Diagnosis not present

## 2016-04-12 DIAGNOSIS — D509 Iron deficiency anemia, unspecified: Secondary | ICD-10-CM | POA: Diagnosis not present

## 2016-04-12 DIAGNOSIS — N186 End stage renal disease: Secondary | ICD-10-CM | POA: Diagnosis not present

## 2016-04-12 DIAGNOSIS — E440 Moderate protein-calorie malnutrition: Secondary | ICD-10-CM | POA: Diagnosis not present

## 2016-04-13 DIAGNOSIS — N186 End stage renal disease: Secondary | ICD-10-CM | POA: Diagnosis not present

## 2016-04-13 DIAGNOSIS — Z5189 Encounter for other specified aftercare: Secondary | ICD-10-CM | POA: Diagnosis not present

## 2016-04-13 DIAGNOSIS — D509 Iron deficiency anemia, unspecified: Secondary | ICD-10-CM | POA: Diagnosis not present

## 2016-04-13 DIAGNOSIS — D631 Anemia in chronic kidney disease: Secondary | ICD-10-CM | POA: Diagnosis not present

## 2016-04-13 DIAGNOSIS — E440 Moderate protein-calorie malnutrition: Secondary | ICD-10-CM | POA: Diagnosis not present

## 2016-04-13 DIAGNOSIS — Z79899 Other long term (current) drug therapy: Secondary | ICD-10-CM | POA: Diagnosis not present

## 2016-04-14 DIAGNOSIS — Z5189 Encounter for other specified aftercare: Secondary | ICD-10-CM | POA: Diagnosis not present

## 2016-04-14 DIAGNOSIS — E440 Moderate protein-calorie malnutrition: Secondary | ICD-10-CM | POA: Diagnosis not present

## 2016-04-14 DIAGNOSIS — Z79899 Other long term (current) drug therapy: Secondary | ICD-10-CM | POA: Diagnosis not present

## 2016-04-14 DIAGNOSIS — D631 Anemia in chronic kidney disease: Secondary | ICD-10-CM | POA: Diagnosis not present

## 2016-04-14 DIAGNOSIS — D509 Iron deficiency anemia, unspecified: Secondary | ICD-10-CM | POA: Diagnosis not present

## 2016-04-14 DIAGNOSIS — N186 End stage renal disease: Secondary | ICD-10-CM | POA: Diagnosis not present

## 2016-04-15 DIAGNOSIS — Z79899 Other long term (current) drug therapy: Secondary | ICD-10-CM | POA: Diagnosis not present

## 2016-04-15 DIAGNOSIS — E440 Moderate protein-calorie malnutrition: Secondary | ICD-10-CM | POA: Diagnosis not present

## 2016-04-15 DIAGNOSIS — D631 Anemia in chronic kidney disease: Secondary | ICD-10-CM | POA: Diagnosis not present

## 2016-04-15 DIAGNOSIS — N186 End stage renal disease: Secondary | ICD-10-CM | POA: Diagnosis not present

## 2016-04-15 DIAGNOSIS — D509 Iron deficiency anemia, unspecified: Secondary | ICD-10-CM | POA: Diagnosis not present

## 2016-04-15 DIAGNOSIS — Z5189 Encounter for other specified aftercare: Secondary | ICD-10-CM | POA: Diagnosis not present

## 2016-04-16 DIAGNOSIS — E440 Moderate protein-calorie malnutrition: Secondary | ICD-10-CM | POA: Diagnosis not present

## 2016-04-16 DIAGNOSIS — D631 Anemia in chronic kidney disease: Secondary | ICD-10-CM | POA: Diagnosis not present

## 2016-04-16 DIAGNOSIS — D509 Iron deficiency anemia, unspecified: Secondary | ICD-10-CM | POA: Diagnosis not present

## 2016-04-16 DIAGNOSIS — Z79899 Other long term (current) drug therapy: Secondary | ICD-10-CM | POA: Diagnosis not present

## 2016-04-16 DIAGNOSIS — Z5189 Encounter for other specified aftercare: Secondary | ICD-10-CM | POA: Diagnosis not present

## 2016-04-16 DIAGNOSIS — N186 End stage renal disease: Secondary | ICD-10-CM | POA: Diagnosis not present

## 2016-04-17 DIAGNOSIS — D631 Anemia in chronic kidney disease: Secondary | ICD-10-CM | POA: Diagnosis not present

## 2016-04-17 DIAGNOSIS — E440 Moderate protein-calorie malnutrition: Secondary | ICD-10-CM | POA: Diagnosis not present

## 2016-04-17 DIAGNOSIS — N186 End stage renal disease: Secondary | ICD-10-CM | POA: Diagnosis not present

## 2016-04-17 DIAGNOSIS — Z79899 Other long term (current) drug therapy: Secondary | ICD-10-CM | POA: Diagnosis not present

## 2016-04-17 DIAGNOSIS — D509 Iron deficiency anemia, unspecified: Secondary | ICD-10-CM | POA: Diagnosis not present

## 2016-04-17 DIAGNOSIS — Z5189 Encounter for other specified aftercare: Secondary | ICD-10-CM | POA: Diagnosis not present

## 2016-04-18 DIAGNOSIS — Z5189 Encounter for other specified aftercare: Secondary | ICD-10-CM | POA: Diagnosis not present

## 2016-04-18 DIAGNOSIS — E440 Moderate protein-calorie malnutrition: Secondary | ICD-10-CM | POA: Diagnosis not present

## 2016-04-18 DIAGNOSIS — D509 Iron deficiency anemia, unspecified: Secondary | ICD-10-CM | POA: Diagnosis not present

## 2016-04-18 DIAGNOSIS — N186 End stage renal disease: Secondary | ICD-10-CM | POA: Diagnosis not present

## 2016-04-18 DIAGNOSIS — Z79899 Other long term (current) drug therapy: Secondary | ICD-10-CM | POA: Diagnosis not present

## 2016-04-18 DIAGNOSIS — D631 Anemia in chronic kidney disease: Secondary | ICD-10-CM | POA: Diagnosis not present

## 2016-04-19 DIAGNOSIS — D509 Iron deficiency anemia, unspecified: Secondary | ICD-10-CM | POA: Diagnosis not present

## 2016-04-19 DIAGNOSIS — N186 End stage renal disease: Secondary | ICD-10-CM | POA: Diagnosis not present

## 2016-04-19 DIAGNOSIS — Z5189 Encounter for other specified aftercare: Secondary | ICD-10-CM | POA: Diagnosis not present

## 2016-04-19 DIAGNOSIS — E440 Moderate protein-calorie malnutrition: Secondary | ICD-10-CM | POA: Diagnosis not present

## 2016-04-19 DIAGNOSIS — Z79899 Other long term (current) drug therapy: Secondary | ICD-10-CM | POA: Diagnosis not present

## 2016-04-19 DIAGNOSIS — D631 Anemia in chronic kidney disease: Secondary | ICD-10-CM | POA: Diagnosis not present

## 2016-04-20 DIAGNOSIS — Z79899 Other long term (current) drug therapy: Secondary | ICD-10-CM | POA: Diagnosis not present

## 2016-04-20 DIAGNOSIS — E440 Moderate protein-calorie malnutrition: Secondary | ICD-10-CM | POA: Diagnosis not present

## 2016-04-20 DIAGNOSIS — Z5189 Encounter for other specified aftercare: Secondary | ICD-10-CM | POA: Diagnosis not present

## 2016-04-20 DIAGNOSIS — D509 Iron deficiency anemia, unspecified: Secondary | ICD-10-CM | POA: Diagnosis not present

## 2016-04-20 DIAGNOSIS — D631 Anemia in chronic kidney disease: Secondary | ICD-10-CM | POA: Diagnosis not present

## 2016-04-20 DIAGNOSIS — N186 End stage renal disease: Secondary | ICD-10-CM | POA: Diagnosis not present

## 2016-04-21 DIAGNOSIS — D509 Iron deficiency anemia, unspecified: Secondary | ICD-10-CM | POA: Diagnosis not present

## 2016-04-21 DIAGNOSIS — D631 Anemia in chronic kidney disease: Secondary | ICD-10-CM | POA: Diagnosis not present

## 2016-04-21 DIAGNOSIS — Z5189 Encounter for other specified aftercare: Secondary | ICD-10-CM | POA: Diagnosis not present

## 2016-04-21 DIAGNOSIS — E440 Moderate protein-calorie malnutrition: Secondary | ICD-10-CM | POA: Diagnosis not present

## 2016-04-21 DIAGNOSIS — Z79899 Other long term (current) drug therapy: Secondary | ICD-10-CM | POA: Diagnosis not present

## 2016-04-21 DIAGNOSIS — N186 End stage renal disease: Secondary | ICD-10-CM | POA: Diagnosis not present

## 2016-04-22 DIAGNOSIS — D509 Iron deficiency anemia, unspecified: Secondary | ICD-10-CM | POA: Diagnosis not present

## 2016-04-22 DIAGNOSIS — N186 End stage renal disease: Secondary | ICD-10-CM | POA: Diagnosis not present

## 2016-04-22 DIAGNOSIS — Z79899 Other long term (current) drug therapy: Secondary | ICD-10-CM | POA: Diagnosis not present

## 2016-04-22 DIAGNOSIS — D631 Anemia in chronic kidney disease: Secondary | ICD-10-CM | POA: Diagnosis not present

## 2016-04-22 DIAGNOSIS — Z5189 Encounter for other specified aftercare: Secondary | ICD-10-CM | POA: Diagnosis not present

## 2016-04-22 DIAGNOSIS — E440 Moderate protein-calorie malnutrition: Secondary | ICD-10-CM | POA: Diagnosis not present

## 2016-04-23 DIAGNOSIS — Z79899 Other long term (current) drug therapy: Secondary | ICD-10-CM | POA: Diagnosis not present

## 2016-04-23 DIAGNOSIS — D509 Iron deficiency anemia, unspecified: Secondary | ICD-10-CM | POA: Diagnosis not present

## 2016-04-23 DIAGNOSIS — N186 End stage renal disease: Secondary | ICD-10-CM | POA: Diagnosis not present

## 2016-04-23 DIAGNOSIS — Z5189 Encounter for other specified aftercare: Secondary | ICD-10-CM | POA: Diagnosis not present

## 2016-04-23 DIAGNOSIS — E440 Moderate protein-calorie malnutrition: Secondary | ICD-10-CM | POA: Diagnosis not present

## 2016-04-23 DIAGNOSIS — D631 Anemia in chronic kidney disease: Secondary | ICD-10-CM | POA: Diagnosis not present

## 2016-04-24 DIAGNOSIS — D509 Iron deficiency anemia, unspecified: Secondary | ICD-10-CM | POA: Diagnosis not present

## 2016-04-24 DIAGNOSIS — E440 Moderate protein-calorie malnutrition: Secondary | ICD-10-CM | POA: Diagnosis not present

## 2016-04-24 DIAGNOSIS — D631 Anemia in chronic kidney disease: Secondary | ICD-10-CM | POA: Diagnosis not present

## 2016-04-24 DIAGNOSIS — N186 End stage renal disease: Secondary | ICD-10-CM | POA: Diagnosis not present

## 2016-04-24 DIAGNOSIS — Z79899 Other long term (current) drug therapy: Secondary | ICD-10-CM | POA: Diagnosis not present

## 2016-04-24 DIAGNOSIS — Z5189 Encounter for other specified aftercare: Secondary | ICD-10-CM | POA: Diagnosis not present

## 2016-04-25 DIAGNOSIS — N186 End stage renal disease: Secondary | ICD-10-CM | POA: Diagnosis not present

## 2016-04-25 DIAGNOSIS — D509 Iron deficiency anemia, unspecified: Secondary | ICD-10-CM | POA: Diagnosis not present

## 2016-04-25 DIAGNOSIS — D631 Anemia in chronic kidney disease: Secondary | ICD-10-CM | POA: Diagnosis not present

## 2016-04-25 DIAGNOSIS — Z5189 Encounter for other specified aftercare: Secondary | ICD-10-CM | POA: Diagnosis not present

## 2016-04-25 DIAGNOSIS — Z79899 Other long term (current) drug therapy: Secondary | ICD-10-CM | POA: Diagnosis not present

## 2016-04-25 DIAGNOSIS — E440 Moderate protein-calorie malnutrition: Secondary | ICD-10-CM | POA: Diagnosis not present

## 2016-04-26 DIAGNOSIS — I129 Hypertensive chronic kidney disease with stage 1 through stage 4 chronic kidney disease, or unspecified chronic kidney disease: Secondary | ICD-10-CM | POA: Diagnosis not present

## 2016-04-26 DIAGNOSIS — N186 End stage renal disease: Secondary | ICD-10-CM | POA: Diagnosis not present

## 2016-04-26 DIAGNOSIS — D631 Anemia in chronic kidney disease: Secondary | ICD-10-CM | POA: Diagnosis not present

## 2016-04-26 DIAGNOSIS — Z5189 Encounter for other specified aftercare: Secondary | ICD-10-CM | POA: Diagnosis not present

## 2016-04-26 DIAGNOSIS — Z992 Dependence on renal dialysis: Secondary | ICD-10-CM | POA: Diagnosis not present

## 2016-04-26 DIAGNOSIS — Z79899 Other long term (current) drug therapy: Secondary | ICD-10-CM | POA: Diagnosis not present

## 2016-04-26 DIAGNOSIS — D509 Iron deficiency anemia, unspecified: Secondary | ICD-10-CM | POA: Diagnosis not present

## 2016-04-26 DIAGNOSIS — E440 Moderate protein-calorie malnutrition: Secondary | ICD-10-CM | POA: Diagnosis not present

## 2016-04-27 DIAGNOSIS — Z5189 Encounter for other specified aftercare: Secondary | ICD-10-CM | POA: Diagnosis not present

## 2016-04-27 DIAGNOSIS — N186 End stage renal disease: Secondary | ICD-10-CM | POA: Diagnosis not present

## 2016-04-27 DIAGNOSIS — Z79899 Other long term (current) drug therapy: Secondary | ICD-10-CM | POA: Diagnosis not present

## 2016-04-27 DIAGNOSIS — K769 Liver disease, unspecified: Secondary | ICD-10-CM | POA: Diagnosis not present

## 2016-04-27 DIAGNOSIS — N2581 Secondary hyperparathyroidism of renal origin: Secondary | ICD-10-CM | POA: Diagnosis not present

## 2016-04-27 DIAGNOSIS — D631 Anemia in chronic kidney disease: Secondary | ICD-10-CM | POA: Diagnosis not present

## 2016-04-27 DIAGNOSIS — D509 Iron deficiency anemia, unspecified: Secondary | ICD-10-CM | POA: Diagnosis not present

## 2016-04-27 DIAGNOSIS — E440 Moderate protein-calorie malnutrition: Secondary | ICD-10-CM | POA: Diagnosis not present

## 2016-04-28 DIAGNOSIS — D631 Anemia in chronic kidney disease: Secondary | ICD-10-CM | POA: Diagnosis not present

## 2016-04-28 DIAGNOSIS — K769 Liver disease, unspecified: Secondary | ICD-10-CM | POA: Diagnosis not present

## 2016-04-28 DIAGNOSIS — Z5189 Encounter for other specified aftercare: Secondary | ICD-10-CM | POA: Diagnosis not present

## 2016-04-28 DIAGNOSIS — N186 End stage renal disease: Secondary | ICD-10-CM | POA: Diagnosis not present

## 2016-04-28 DIAGNOSIS — D509 Iron deficiency anemia, unspecified: Secondary | ICD-10-CM | POA: Diagnosis not present

## 2016-04-28 DIAGNOSIS — N2581 Secondary hyperparathyroidism of renal origin: Secondary | ICD-10-CM | POA: Diagnosis not present

## 2016-04-29 DIAGNOSIS — D631 Anemia in chronic kidney disease: Secondary | ICD-10-CM | POA: Diagnosis not present

## 2016-04-29 DIAGNOSIS — N2581 Secondary hyperparathyroidism of renal origin: Secondary | ICD-10-CM | POA: Diagnosis not present

## 2016-04-29 DIAGNOSIS — K769 Liver disease, unspecified: Secondary | ICD-10-CM | POA: Diagnosis not present

## 2016-04-29 DIAGNOSIS — N186 End stage renal disease: Secondary | ICD-10-CM | POA: Diagnosis not present

## 2016-04-29 DIAGNOSIS — D509 Iron deficiency anemia, unspecified: Secondary | ICD-10-CM | POA: Diagnosis not present

## 2016-04-29 DIAGNOSIS — Z5189 Encounter for other specified aftercare: Secondary | ICD-10-CM | POA: Diagnosis not present

## 2016-04-30 DIAGNOSIS — D509 Iron deficiency anemia, unspecified: Secondary | ICD-10-CM | POA: Diagnosis not present

## 2016-04-30 DIAGNOSIS — K769 Liver disease, unspecified: Secondary | ICD-10-CM | POA: Diagnosis not present

## 2016-04-30 DIAGNOSIS — N2581 Secondary hyperparathyroidism of renal origin: Secondary | ICD-10-CM | POA: Diagnosis not present

## 2016-04-30 DIAGNOSIS — D631 Anemia in chronic kidney disease: Secondary | ICD-10-CM | POA: Diagnosis not present

## 2016-04-30 DIAGNOSIS — N186 End stage renal disease: Secondary | ICD-10-CM | POA: Diagnosis not present

## 2016-04-30 DIAGNOSIS — Z5189 Encounter for other specified aftercare: Secondary | ICD-10-CM | POA: Diagnosis not present

## 2016-05-01 ENCOUNTER — Institutional Professional Consult (permissible substitution): Payer: Medicare Other | Admitting: Pulmonary Disease

## 2016-05-01 DIAGNOSIS — D509 Iron deficiency anemia, unspecified: Secondary | ICD-10-CM | POA: Diagnosis not present

## 2016-05-01 DIAGNOSIS — R8299 Other abnormal findings in urine: Secondary | ICD-10-CM | POA: Diagnosis not present

## 2016-05-01 DIAGNOSIS — D631 Anemia in chronic kidney disease: Secondary | ICD-10-CM | POA: Diagnosis not present

## 2016-05-01 DIAGNOSIS — Z5189 Encounter for other specified aftercare: Secondary | ICD-10-CM | POA: Diagnosis not present

## 2016-05-01 DIAGNOSIS — N186 End stage renal disease: Secondary | ICD-10-CM | POA: Diagnosis not present

## 2016-05-01 DIAGNOSIS — N2581 Secondary hyperparathyroidism of renal origin: Secondary | ICD-10-CM | POA: Diagnosis not present

## 2016-05-01 DIAGNOSIS — K769 Liver disease, unspecified: Secondary | ICD-10-CM | POA: Diagnosis not present

## 2016-05-02 DIAGNOSIS — N186 End stage renal disease: Secondary | ICD-10-CM | POA: Diagnosis not present

## 2016-05-02 DIAGNOSIS — N2581 Secondary hyperparathyroidism of renal origin: Secondary | ICD-10-CM | POA: Diagnosis not present

## 2016-05-02 DIAGNOSIS — D631 Anemia in chronic kidney disease: Secondary | ICD-10-CM | POA: Diagnosis not present

## 2016-05-02 DIAGNOSIS — Z5189 Encounter for other specified aftercare: Secondary | ICD-10-CM | POA: Diagnosis not present

## 2016-05-02 DIAGNOSIS — K769 Liver disease, unspecified: Secondary | ICD-10-CM | POA: Diagnosis not present

## 2016-05-02 DIAGNOSIS — D509 Iron deficiency anemia, unspecified: Secondary | ICD-10-CM | POA: Diagnosis not present

## 2016-05-03 DIAGNOSIS — N2581 Secondary hyperparathyroidism of renal origin: Secondary | ICD-10-CM | POA: Diagnosis not present

## 2016-05-03 DIAGNOSIS — Z5189 Encounter for other specified aftercare: Secondary | ICD-10-CM | POA: Diagnosis not present

## 2016-05-03 DIAGNOSIS — K769 Liver disease, unspecified: Secondary | ICD-10-CM | POA: Diagnosis not present

## 2016-05-03 DIAGNOSIS — N186 End stage renal disease: Secondary | ICD-10-CM | POA: Diagnosis not present

## 2016-05-03 DIAGNOSIS — D509 Iron deficiency anemia, unspecified: Secondary | ICD-10-CM | POA: Diagnosis not present

## 2016-05-03 DIAGNOSIS — D631 Anemia in chronic kidney disease: Secondary | ICD-10-CM | POA: Diagnosis not present

## 2016-05-04 DIAGNOSIS — Z5189 Encounter for other specified aftercare: Secondary | ICD-10-CM | POA: Diagnosis not present

## 2016-05-04 DIAGNOSIS — D631 Anemia in chronic kidney disease: Secondary | ICD-10-CM | POA: Diagnosis not present

## 2016-05-04 DIAGNOSIS — D509 Iron deficiency anemia, unspecified: Secondary | ICD-10-CM | POA: Diagnosis not present

## 2016-05-04 DIAGNOSIS — N2581 Secondary hyperparathyroidism of renal origin: Secondary | ICD-10-CM | POA: Diagnosis not present

## 2016-05-04 DIAGNOSIS — N186 End stage renal disease: Secondary | ICD-10-CM | POA: Diagnosis not present

## 2016-05-04 DIAGNOSIS — K769 Liver disease, unspecified: Secondary | ICD-10-CM | POA: Diagnosis not present

## 2016-05-05 DIAGNOSIS — N186 End stage renal disease: Secondary | ICD-10-CM | POA: Diagnosis not present

## 2016-05-05 DIAGNOSIS — K769 Liver disease, unspecified: Secondary | ICD-10-CM | POA: Diagnosis not present

## 2016-05-05 DIAGNOSIS — D509 Iron deficiency anemia, unspecified: Secondary | ICD-10-CM | POA: Diagnosis not present

## 2016-05-05 DIAGNOSIS — N2581 Secondary hyperparathyroidism of renal origin: Secondary | ICD-10-CM | POA: Diagnosis not present

## 2016-05-05 DIAGNOSIS — Z5189 Encounter for other specified aftercare: Secondary | ICD-10-CM | POA: Diagnosis not present

## 2016-05-05 DIAGNOSIS — D631 Anemia in chronic kidney disease: Secondary | ICD-10-CM | POA: Diagnosis not present

## 2016-05-06 DIAGNOSIS — Z5189 Encounter for other specified aftercare: Secondary | ICD-10-CM | POA: Diagnosis not present

## 2016-05-06 DIAGNOSIS — N186 End stage renal disease: Secondary | ICD-10-CM | POA: Diagnosis not present

## 2016-05-06 DIAGNOSIS — N2581 Secondary hyperparathyroidism of renal origin: Secondary | ICD-10-CM | POA: Diagnosis not present

## 2016-05-06 DIAGNOSIS — K769 Liver disease, unspecified: Secondary | ICD-10-CM | POA: Diagnosis not present

## 2016-05-06 DIAGNOSIS — D509 Iron deficiency anemia, unspecified: Secondary | ICD-10-CM | POA: Diagnosis not present

## 2016-05-06 DIAGNOSIS — D631 Anemia in chronic kidney disease: Secondary | ICD-10-CM | POA: Diagnosis not present

## 2016-05-07 DIAGNOSIS — N2581 Secondary hyperparathyroidism of renal origin: Secondary | ICD-10-CM | POA: Diagnosis not present

## 2016-05-07 DIAGNOSIS — D509 Iron deficiency anemia, unspecified: Secondary | ICD-10-CM | POA: Diagnosis not present

## 2016-05-07 DIAGNOSIS — Z5189 Encounter for other specified aftercare: Secondary | ICD-10-CM | POA: Diagnosis not present

## 2016-05-07 DIAGNOSIS — N186 End stage renal disease: Secondary | ICD-10-CM | POA: Diagnosis not present

## 2016-05-07 DIAGNOSIS — D631 Anemia in chronic kidney disease: Secondary | ICD-10-CM | POA: Diagnosis not present

## 2016-05-07 DIAGNOSIS — K769 Liver disease, unspecified: Secondary | ICD-10-CM | POA: Diagnosis not present

## 2016-05-08 DIAGNOSIS — D631 Anemia in chronic kidney disease: Secondary | ICD-10-CM | POA: Diagnosis not present

## 2016-05-08 DIAGNOSIS — N2581 Secondary hyperparathyroidism of renal origin: Secondary | ICD-10-CM | POA: Diagnosis not present

## 2016-05-08 DIAGNOSIS — K769 Liver disease, unspecified: Secondary | ICD-10-CM | POA: Diagnosis not present

## 2016-05-08 DIAGNOSIS — N186 End stage renal disease: Secondary | ICD-10-CM | POA: Diagnosis not present

## 2016-05-08 DIAGNOSIS — Z5189 Encounter for other specified aftercare: Secondary | ICD-10-CM | POA: Diagnosis not present

## 2016-05-08 DIAGNOSIS — D509 Iron deficiency anemia, unspecified: Secondary | ICD-10-CM | POA: Diagnosis not present

## 2016-05-09 DIAGNOSIS — D509 Iron deficiency anemia, unspecified: Secondary | ICD-10-CM | POA: Diagnosis not present

## 2016-05-09 DIAGNOSIS — N186 End stage renal disease: Secondary | ICD-10-CM | POA: Diagnosis not present

## 2016-05-09 DIAGNOSIS — K769 Liver disease, unspecified: Secondary | ICD-10-CM | POA: Diagnosis not present

## 2016-05-09 DIAGNOSIS — Z5189 Encounter for other specified aftercare: Secondary | ICD-10-CM | POA: Diagnosis not present

## 2016-05-09 DIAGNOSIS — N2581 Secondary hyperparathyroidism of renal origin: Secondary | ICD-10-CM | POA: Diagnosis not present

## 2016-05-09 DIAGNOSIS — D631 Anemia in chronic kidney disease: Secondary | ICD-10-CM | POA: Diagnosis not present

## 2016-05-10 DIAGNOSIS — N186 End stage renal disease: Secondary | ICD-10-CM | POA: Diagnosis not present

## 2016-05-10 DIAGNOSIS — D631 Anemia in chronic kidney disease: Secondary | ICD-10-CM | POA: Diagnosis not present

## 2016-05-10 DIAGNOSIS — K769 Liver disease, unspecified: Secondary | ICD-10-CM | POA: Diagnosis not present

## 2016-05-10 DIAGNOSIS — D509 Iron deficiency anemia, unspecified: Secondary | ICD-10-CM | POA: Diagnosis not present

## 2016-05-10 DIAGNOSIS — N2581 Secondary hyperparathyroidism of renal origin: Secondary | ICD-10-CM | POA: Diagnosis not present

## 2016-05-10 DIAGNOSIS — Z5189 Encounter for other specified aftercare: Secondary | ICD-10-CM | POA: Diagnosis not present

## 2016-05-11 DIAGNOSIS — K769 Liver disease, unspecified: Secondary | ICD-10-CM | POA: Diagnosis not present

## 2016-05-11 DIAGNOSIS — N186 End stage renal disease: Secondary | ICD-10-CM | POA: Diagnosis not present

## 2016-05-11 DIAGNOSIS — N2581 Secondary hyperparathyroidism of renal origin: Secondary | ICD-10-CM | POA: Diagnosis not present

## 2016-05-11 DIAGNOSIS — Z5189 Encounter for other specified aftercare: Secondary | ICD-10-CM | POA: Diagnosis not present

## 2016-05-11 DIAGNOSIS — D631 Anemia in chronic kidney disease: Secondary | ICD-10-CM | POA: Diagnosis not present

## 2016-05-11 DIAGNOSIS — D509 Iron deficiency anemia, unspecified: Secondary | ICD-10-CM | POA: Diagnosis not present

## 2016-05-12 DIAGNOSIS — N186 End stage renal disease: Secondary | ICD-10-CM | POA: Diagnosis not present

## 2016-05-12 DIAGNOSIS — N2581 Secondary hyperparathyroidism of renal origin: Secondary | ICD-10-CM | POA: Diagnosis not present

## 2016-05-12 DIAGNOSIS — D631 Anemia in chronic kidney disease: Secondary | ICD-10-CM | POA: Diagnosis not present

## 2016-05-12 DIAGNOSIS — Z5189 Encounter for other specified aftercare: Secondary | ICD-10-CM | POA: Diagnosis not present

## 2016-05-12 DIAGNOSIS — D509 Iron deficiency anemia, unspecified: Secondary | ICD-10-CM | POA: Diagnosis not present

## 2016-05-12 DIAGNOSIS — K769 Liver disease, unspecified: Secondary | ICD-10-CM | POA: Diagnosis not present

## 2016-05-13 DIAGNOSIS — D631 Anemia in chronic kidney disease: Secondary | ICD-10-CM | POA: Diagnosis not present

## 2016-05-13 DIAGNOSIS — N2581 Secondary hyperparathyroidism of renal origin: Secondary | ICD-10-CM | POA: Diagnosis not present

## 2016-05-13 DIAGNOSIS — D509 Iron deficiency anemia, unspecified: Secondary | ICD-10-CM | POA: Diagnosis not present

## 2016-05-13 DIAGNOSIS — N186 End stage renal disease: Secondary | ICD-10-CM | POA: Diagnosis not present

## 2016-05-13 DIAGNOSIS — K769 Liver disease, unspecified: Secondary | ICD-10-CM | POA: Diagnosis not present

## 2016-05-13 DIAGNOSIS — Z5189 Encounter for other specified aftercare: Secondary | ICD-10-CM | POA: Diagnosis not present

## 2016-05-14 DIAGNOSIS — N186 End stage renal disease: Secondary | ICD-10-CM | POA: Diagnosis not present

## 2016-05-14 DIAGNOSIS — D631 Anemia in chronic kidney disease: Secondary | ICD-10-CM | POA: Diagnosis not present

## 2016-05-14 DIAGNOSIS — N2581 Secondary hyperparathyroidism of renal origin: Secondary | ICD-10-CM | POA: Diagnosis not present

## 2016-05-14 DIAGNOSIS — Z5189 Encounter for other specified aftercare: Secondary | ICD-10-CM | POA: Diagnosis not present

## 2016-05-14 DIAGNOSIS — D509 Iron deficiency anemia, unspecified: Secondary | ICD-10-CM | POA: Diagnosis not present

## 2016-05-14 DIAGNOSIS — K769 Liver disease, unspecified: Secondary | ICD-10-CM | POA: Diagnosis not present

## 2016-05-15 DIAGNOSIS — D509 Iron deficiency anemia, unspecified: Secondary | ICD-10-CM | POA: Diagnosis not present

## 2016-05-15 DIAGNOSIS — N186 End stage renal disease: Secondary | ICD-10-CM | POA: Diagnosis not present

## 2016-05-15 DIAGNOSIS — N2581 Secondary hyperparathyroidism of renal origin: Secondary | ICD-10-CM | POA: Diagnosis not present

## 2016-05-15 DIAGNOSIS — D631 Anemia in chronic kidney disease: Secondary | ICD-10-CM | POA: Diagnosis not present

## 2016-05-15 DIAGNOSIS — K769 Liver disease, unspecified: Secondary | ICD-10-CM | POA: Diagnosis not present

## 2016-05-15 DIAGNOSIS — Z5189 Encounter for other specified aftercare: Secondary | ICD-10-CM | POA: Diagnosis not present

## 2016-05-16 DIAGNOSIS — N2581 Secondary hyperparathyroidism of renal origin: Secondary | ICD-10-CM | POA: Diagnosis not present

## 2016-05-16 DIAGNOSIS — D509 Iron deficiency anemia, unspecified: Secondary | ICD-10-CM | POA: Diagnosis not present

## 2016-05-16 DIAGNOSIS — N186 End stage renal disease: Secondary | ICD-10-CM | POA: Diagnosis not present

## 2016-05-16 DIAGNOSIS — Z5189 Encounter for other specified aftercare: Secondary | ICD-10-CM | POA: Diagnosis not present

## 2016-05-16 DIAGNOSIS — D631 Anemia in chronic kidney disease: Secondary | ICD-10-CM | POA: Diagnosis not present

## 2016-05-16 DIAGNOSIS — K769 Liver disease, unspecified: Secondary | ICD-10-CM | POA: Diagnosis not present

## 2016-05-17 DIAGNOSIS — D509 Iron deficiency anemia, unspecified: Secondary | ICD-10-CM | POA: Diagnosis not present

## 2016-05-17 DIAGNOSIS — D631 Anemia in chronic kidney disease: Secondary | ICD-10-CM | POA: Diagnosis not present

## 2016-05-17 DIAGNOSIS — Z5189 Encounter for other specified aftercare: Secondary | ICD-10-CM | POA: Diagnosis not present

## 2016-05-17 DIAGNOSIS — N186 End stage renal disease: Secondary | ICD-10-CM | POA: Diagnosis not present

## 2016-05-17 DIAGNOSIS — N2581 Secondary hyperparathyroidism of renal origin: Secondary | ICD-10-CM | POA: Diagnosis not present

## 2016-05-17 DIAGNOSIS — K769 Liver disease, unspecified: Secondary | ICD-10-CM | POA: Diagnosis not present

## 2016-05-18 DIAGNOSIS — K769 Liver disease, unspecified: Secondary | ICD-10-CM | POA: Diagnosis not present

## 2016-05-18 DIAGNOSIS — N2581 Secondary hyperparathyroidism of renal origin: Secondary | ICD-10-CM | POA: Diagnosis not present

## 2016-05-18 DIAGNOSIS — D631 Anemia in chronic kidney disease: Secondary | ICD-10-CM | POA: Diagnosis not present

## 2016-05-18 DIAGNOSIS — N186 End stage renal disease: Secondary | ICD-10-CM | POA: Diagnosis not present

## 2016-05-18 DIAGNOSIS — D509 Iron deficiency anemia, unspecified: Secondary | ICD-10-CM | POA: Diagnosis not present

## 2016-05-18 DIAGNOSIS — Z5189 Encounter for other specified aftercare: Secondary | ICD-10-CM | POA: Diagnosis not present

## 2016-05-19 DIAGNOSIS — K769 Liver disease, unspecified: Secondary | ICD-10-CM | POA: Diagnosis not present

## 2016-05-19 DIAGNOSIS — D509 Iron deficiency anemia, unspecified: Secondary | ICD-10-CM | POA: Diagnosis not present

## 2016-05-19 DIAGNOSIS — N186 End stage renal disease: Secondary | ICD-10-CM | POA: Diagnosis not present

## 2016-05-19 DIAGNOSIS — D631 Anemia in chronic kidney disease: Secondary | ICD-10-CM | POA: Diagnosis not present

## 2016-05-19 DIAGNOSIS — N2581 Secondary hyperparathyroidism of renal origin: Secondary | ICD-10-CM | POA: Diagnosis not present

## 2016-05-19 DIAGNOSIS — Z5189 Encounter for other specified aftercare: Secondary | ICD-10-CM | POA: Diagnosis not present

## 2016-05-20 DIAGNOSIS — N186 End stage renal disease: Secondary | ICD-10-CM | POA: Diagnosis not present

## 2016-05-20 DIAGNOSIS — N2581 Secondary hyperparathyroidism of renal origin: Secondary | ICD-10-CM | POA: Diagnosis not present

## 2016-05-20 DIAGNOSIS — Z5189 Encounter for other specified aftercare: Secondary | ICD-10-CM | POA: Diagnosis not present

## 2016-05-20 DIAGNOSIS — D509 Iron deficiency anemia, unspecified: Secondary | ICD-10-CM | POA: Diagnosis not present

## 2016-05-20 DIAGNOSIS — D631 Anemia in chronic kidney disease: Secondary | ICD-10-CM | POA: Diagnosis not present

## 2016-05-20 DIAGNOSIS — K769 Liver disease, unspecified: Secondary | ICD-10-CM | POA: Diagnosis not present

## 2016-05-21 DIAGNOSIS — D631 Anemia in chronic kidney disease: Secondary | ICD-10-CM | POA: Diagnosis not present

## 2016-05-21 DIAGNOSIS — K769 Liver disease, unspecified: Secondary | ICD-10-CM | POA: Diagnosis not present

## 2016-05-21 DIAGNOSIS — N2581 Secondary hyperparathyroidism of renal origin: Secondary | ICD-10-CM | POA: Diagnosis not present

## 2016-05-21 DIAGNOSIS — D509 Iron deficiency anemia, unspecified: Secondary | ICD-10-CM | POA: Diagnosis not present

## 2016-05-21 DIAGNOSIS — N186 End stage renal disease: Secondary | ICD-10-CM | POA: Diagnosis not present

## 2016-05-21 DIAGNOSIS — Z5189 Encounter for other specified aftercare: Secondary | ICD-10-CM | POA: Diagnosis not present

## 2016-05-22 DIAGNOSIS — D509 Iron deficiency anemia, unspecified: Secondary | ICD-10-CM | POA: Diagnosis not present

## 2016-05-22 DIAGNOSIS — D631 Anemia in chronic kidney disease: Secondary | ICD-10-CM | POA: Diagnosis not present

## 2016-05-22 DIAGNOSIS — N2581 Secondary hyperparathyroidism of renal origin: Secondary | ICD-10-CM | POA: Diagnosis not present

## 2016-05-22 DIAGNOSIS — Z5189 Encounter for other specified aftercare: Secondary | ICD-10-CM | POA: Diagnosis not present

## 2016-05-22 DIAGNOSIS — N186 End stage renal disease: Secondary | ICD-10-CM | POA: Diagnosis not present

## 2016-05-22 DIAGNOSIS — K769 Liver disease, unspecified: Secondary | ICD-10-CM | POA: Diagnosis not present

## 2016-05-23 DIAGNOSIS — D509 Iron deficiency anemia, unspecified: Secondary | ICD-10-CM | POA: Diagnosis not present

## 2016-05-23 DIAGNOSIS — Z5189 Encounter for other specified aftercare: Secondary | ICD-10-CM | POA: Diagnosis not present

## 2016-05-23 DIAGNOSIS — N2581 Secondary hyperparathyroidism of renal origin: Secondary | ICD-10-CM | POA: Diagnosis not present

## 2016-05-23 DIAGNOSIS — D631 Anemia in chronic kidney disease: Secondary | ICD-10-CM | POA: Diagnosis not present

## 2016-05-23 DIAGNOSIS — N186 End stage renal disease: Secondary | ICD-10-CM | POA: Diagnosis not present

## 2016-05-23 DIAGNOSIS — K769 Liver disease, unspecified: Secondary | ICD-10-CM | POA: Diagnosis not present

## 2016-05-24 DIAGNOSIS — N186 End stage renal disease: Secondary | ICD-10-CM | POA: Diagnosis not present

## 2016-05-24 DIAGNOSIS — Z5189 Encounter for other specified aftercare: Secondary | ICD-10-CM | POA: Diagnosis not present

## 2016-05-24 DIAGNOSIS — D509 Iron deficiency anemia, unspecified: Secondary | ICD-10-CM | POA: Diagnosis not present

## 2016-05-24 DIAGNOSIS — D631 Anemia in chronic kidney disease: Secondary | ICD-10-CM | POA: Diagnosis not present

## 2016-05-24 DIAGNOSIS — K769 Liver disease, unspecified: Secondary | ICD-10-CM | POA: Diagnosis not present

## 2016-05-24 DIAGNOSIS — N2581 Secondary hyperparathyroidism of renal origin: Secondary | ICD-10-CM | POA: Diagnosis not present

## 2016-05-25 DIAGNOSIS — D509 Iron deficiency anemia, unspecified: Secondary | ICD-10-CM | POA: Diagnosis not present

## 2016-05-25 DIAGNOSIS — K769 Liver disease, unspecified: Secondary | ICD-10-CM | POA: Diagnosis not present

## 2016-05-25 DIAGNOSIS — D631 Anemia in chronic kidney disease: Secondary | ICD-10-CM | POA: Diagnosis not present

## 2016-05-25 DIAGNOSIS — Z5189 Encounter for other specified aftercare: Secondary | ICD-10-CM | POA: Diagnosis not present

## 2016-05-25 DIAGNOSIS — N186 End stage renal disease: Secondary | ICD-10-CM | POA: Diagnosis not present

## 2016-05-25 DIAGNOSIS — N2581 Secondary hyperparathyroidism of renal origin: Secondary | ICD-10-CM | POA: Diagnosis not present

## 2016-05-26 DIAGNOSIS — N2581 Secondary hyperparathyroidism of renal origin: Secondary | ICD-10-CM | POA: Diagnosis not present

## 2016-05-26 DIAGNOSIS — Z5189 Encounter for other specified aftercare: Secondary | ICD-10-CM | POA: Diagnosis not present

## 2016-05-26 DIAGNOSIS — D631 Anemia in chronic kidney disease: Secondary | ICD-10-CM | POA: Diagnosis not present

## 2016-05-26 DIAGNOSIS — Z992 Dependence on renal dialysis: Secondary | ICD-10-CM | POA: Diagnosis not present

## 2016-05-26 DIAGNOSIS — D509 Iron deficiency anemia, unspecified: Secondary | ICD-10-CM | POA: Diagnosis not present

## 2016-05-26 DIAGNOSIS — K769 Liver disease, unspecified: Secondary | ICD-10-CM | POA: Diagnosis not present

## 2016-05-26 DIAGNOSIS — N186 End stage renal disease: Secondary | ICD-10-CM | POA: Diagnosis not present

## 2016-05-26 DIAGNOSIS — I129 Hypertensive chronic kidney disease with stage 1 through stage 4 chronic kidney disease, or unspecified chronic kidney disease: Secondary | ICD-10-CM | POA: Diagnosis not present

## 2016-05-27 DIAGNOSIS — N2581 Secondary hyperparathyroidism of renal origin: Secondary | ICD-10-CM | POA: Diagnosis not present

## 2016-05-27 DIAGNOSIS — N186 End stage renal disease: Secondary | ICD-10-CM | POA: Diagnosis not present

## 2016-05-27 DIAGNOSIS — K769 Liver disease, unspecified: Secondary | ICD-10-CM | POA: Diagnosis not present

## 2016-05-27 DIAGNOSIS — D509 Iron deficiency anemia, unspecified: Secondary | ICD-10-CM | POA: Diagnosis not present

## 2016-05-27 DIAGNOSIS — E440 Moderate protein-calorie malnutrition: Secondary | ICD-10-CM | POA: Diagnosis not present

## 2016-05-27 DIAGNOSIS — D631 Anemia in chronic kidney disease: Secondary | ICD-10-CM | POA: Diagnosis not present

## 2016-05-27 DIAGNOSIS — N2589 Other disorders resulting from impaired renal tubular function: Secondary | ICD-10-CM | POA: Diagnosis not present

## 2016-05-28 DIAGNOSIS — N186 End stage renal disease: Secondary | ICD-10-CM | POA: Diagnosis not present

## 2016-05-28 DIAGNOSIS — E440 Moderate protein-calorie malnutrition: Secondary | ICD-10-CM | POA: Diagnosis not present

## 2016-05-28 DIAGNOSIS — N2589 Other disorders resulting from impaired renal tubular function: Secondary | ICD-10-CM | POA: Diagnosis not present

## 2016-05-28 DIAGNOSIS — K769 Liver disease, unspecified: Secondary | ICD-10-CM | POA: Diagnosis not present

## 2016-05-28 DIAGNOSIS — N2581 Secondary hyperparathyroidism of renal origin: Secondary | ICD-10-CM | POA: Diagnosis not present

## 2016-05-28 DIAGNOSIS — D509 Iron deficiency anemia, unspecified: Secondary | ICD-10-CM | POA: Diagnosis not present

## 2016-05-29 DIAGNOSIS — E440 Moderate protein-calorie malnutrition: Secondary | ICD-10-CM | POA: Diagnosis not present

## 2016-05-29 DIAGNOSIS — N2589 Other disorders resulting from impaired renal tubular function: Secondary | ICD-10-CM | POA: Diagnosis not present

## 2016-05-29 DIAGNOSIS — N186 End stage renal disease: Secondary | ICD-10-CM | POA: Diagnosis not present

## 2016-05-29 DIAGNOSIS — K769 Liver disease, unspecified: Secondary | ICD-10-CM | POA: Diagnosis not present

## 2016-05-29 DIAGNOSIS — N2581 Secondary hyperparathyroidism of renal origin: Secondary | ICD-10-CM | POA: Diagnosis not present

## 2016-05-29 DIAGNOSIS — D509 Iron deficiency anemia, unspecified: Secondary | ICD-10-CM | POA: Diagnosis not present

## 2016-05-30 DIAGNOSIS — N2581 Secondary hyperparathyroidism of renal origin: Secondary | ICD-10-CM | POA: Diagnosis not present

## 2016-05-30 DIAGNOSIS — N186 End stage renal disease: Secondary | ICD-10-CM | POA: Diagnosis not present

## 2016-05-30 DIAGNOSIS — N2589 Other disorders resulting from impaired renal tubular function: Secondary | ICD-10-CM | POA: Diagnosis not present

## 2016-05-30 DIAGNOSIS — D509 Iron deficiency anemia, unspecified: Secondary | ICD-10-CM | POA: Diagnosis not present

## 2016-05-30 DIAGNOSIS — E440 Moderate protein-calorie malnutrition: Secondary | ICD-10-CM | POA: Diagnosis not present

## 2016-05-30 DIAGNOSIS — K769 Liver disease, unspecified: Secondary | ICD-10-CM | POA: Diagnosis not present

## 2016-05-31 DIAGNOSIS — N2581 Secondary hyperparathyroidism of renal origin: Secondary | ICD-10-CM | POA: Diagnosis not present

## 2016-05-31 DIAGNOSIS — D509 Iron deficiency anemia, unspecified: Secondary | ICD-10-CM | POA: Diagnosis not present

## 2016-05-31 DIAGNOSIS — E440 Moderate protein-calorie malnutrition: Secondary | ICD-10-CM | POA: Diagnosis not present

## 2016-05-31 DIAGNOSIS — K769 Liver disease, unspecified: Secondary | ICD-10-CM | POA: Diagnosis not present

## 2016-05-31 DIAGNOSIS — N2589 Other disorders resulting from impaired renal tubular function: Secondary | ICD-10-CM | POA: Diagnosis not present

## 2016-05-31 DIAGNOSIS — N186 End stage renal disease: Secondary | ICD-10-CM | POA: Diagnosis not present

## 2016-06-01 DIAGNOSIS — E440 Moderate protein-calorie malnutrition: Secondary | ICD-10-CM | POA: Diagnosis not present

## 2016-06-01 DIAGNOSIS — N2589 Other disorders resulting from impaired renal tubular function: Secondary | ICD-10-CM | POA: Diagnosis not present

## 2016-06-01 DIAGNOSIS — D509 Iron deficiency anemia, unspecified: Secondary | ICD-10-CM | POA: Diagnosis not present

## 2016-06-01 DIAGNOSIS — N186 End stage renal disease: Secondary | ICD-10-CM | POA: Diagnosis not present

## 2016-06-01 DIAGNOSIS — N2581 Secondary hyperparathyroidism of renal origin: Secondary | ICD-10-CM | POA: Diagnosis not present

## 2016-06-01 DIAGNOSIS — K769 Liver disease, unspecified: Secondary | ICD-10-CM | POA: Diagnosis not present

## 2016-06-02 DIAGNOSIS — E440 Moderate protein-calorie malnutrition: Secondary | ICD-10-CM | POA: Diagnosis not present

## 2016-06-02 DIAGNOSIS — K769 Liver disease, unspecified: Secondary | ICD-10-CM | POA: Diagnosis not present

## 2016-06-02 DIAGNOSIS — D509 Iron deficiency anemia, unspecified: Secondary | ICD-10-CM | POA: Diagnosis not present

## 2016-06-02 DIAGNOSIS — N186 End stage renal disease: Secondary | ICD-10-CM | POA: Diagnosis not present

## 2016-06-02 DIAGNOSIS — N2589 Other disorders resulting from impaired renal tubular function: Secondary | ICD-10-CM | POA: Diagnosis not present

## 2016-06-02 DIAGNOSIS — N2581 Secondary hyperparathyroidism of renal origin: Secondary | ICD-10-CM | POA: Diagnosis not present

## 2016-06-03 DIAGNOSIS — N2581 Secondary hyperparathyroidism of renal origin: Secondary | ICD-10-CM | POA: Diagnosis not present

## 2016-06-03 DIAGNOSIS — N2589 Other disorders resulting from impaired renal tubular function: Secondary | ICD-10-CM | POA: Diagnosis not present

## 2016-06-03 DIAGNOSIS — N186 End stage renal disease: Secondary | ICD-10-CM | POA: Diagnosis not present

## 2016-06-03 DIAGNOSIS — D509 Iron deficiency anemia, unspecified: Secondary | ICD-10-CM | POA: Diagnosis not present

## 2016-06-03 DIAGNOSIS — K769 Liver disease, unspecified: Secondary | ICD-10-CM | POA: Diagnosis not present

## 2016-06-03 DIAGNOSIS — E440 Moderate protein-calorie malnutrition: Secondary | ICD-10-CM | POA: Diagnosis not present

## 2016-06-04 DIAGNOSIS — E440 Moderate protein-calorie malnutrition: Secondary | ICD-10-CM | POA: Diagnosis not present

## 2016-06-04 DIAGNOSIS — D509 Iron deficiency anemia, unspecified: Secondary | ICD-10-CM | POA: Diagnosis not present

## 2016-06-04 DIAGNOSIS — K769 Liver disease, unspecified: Secondary | ICD-10-CM | POA: Diagnosis not present

## 2016-06-04 DIAGNOSIS — N2581 Secondary hyperparathyroidism of renal origin: Secondary | ICD-10-CM | POA: Diagnosis not present

## 2016-06-04 DIAGNOSIS — N186 End stage renal disease: Secondary | ICD-10-CM | POA: Diagnosis not present

## 2016-06-04 DIAGNOSIS — N2589 Other disorders resulting from impaired renal tubular function: Secondary | ICD-10-CM | POA: Diagnosis not present

## 2016-06-05 DIAGNOSIS — E784 Other hyperlipidemia: Secondary | ICD-10-CM | POA: Diagnosis not present

## 2016-06-05 DIAGNOSIS — N2581 Secondary hyperparathyroidism of renal origin: Secondary | ICD-10-CM | POA: Diagnosis not present

## 2016-06-05 DIAGNOSIS — N186 End stage renal disease: Secondary | ICD-10-CM | POA: Diagnosis not present

## 2016-06-05 DIAGNOSIS — K769 Liver disease, unspecified: Secondary | ICD-10-CM | POA: Diagnosis not present

## 2016-06-05 DIAGNOSIS — E1129 Type 2 diabetes mellitus with other diabetic kidney complication: Secondary | ICD-10-CM | POA: Diagnosis not present

## 2016-06-05 DIAGNOSIS — R8299 Other abnormal findings in urine: Secondary | ICD-10-CM | POA: Diagnosis not present

## 2016-06-05 DIAGNOSIS — N2589 Other disorders resulting from impaired renal tubular function: Secondary | ICD-10-CM | POA: Diagnosis not present

## 2016-06-05 DIAGNOSIS — D509 Iron deficiency anemia, unspecified: Secondary | ICD-10-CM | POA: Diagnosis not present

## 2016-06-05 DIAGNOSIS — E440 Moderate protein-calorie malnutrition: Secondary | ICD-10-CM | POA: Diagnosis not present

## 2016-06-06 DIAGNOSIS — N2589 Other disorders resulting from impaired renal tubular function: Secondary | ICD-10-CM | POA: Diagnosis not present

## 2016-06-06 DIAGNOSIS — N186 End stage renal disease: Secondary | ICD-10-CM | POA: Diagnosis not present

## 2016-06-06 DIAGNOSIS — E440 Moderate protein-calorie malnutrition: Secondary | ICD-10-CM | POA: Diagnosis not present

## 2016-06-06 DIAGNOSIS — K769 Liver disease, unspecified: Secondary | ICD-10-CM | POA: Diagnosis not present

## 2016-06-06 DIAGNOSIS — D509 Iron deficiency anemia, unspecified: Secondary | ICD-10-CM | POA: Diagnosis not present

## 2016-06-06 DIAGNOSIS — N2581 Secondary hyperparathyroidism of renal origin: Secondary | ICD-10-CM | POA: Diagnosis not present

## 2016-06-07 DIAGNOSIS — N2589 Other disorders resulting from impaired renal tubular function: Secondary | ICD-10-CM | POA: Diagnosis not present

## 2016-06-07 DIAGNOSIS — K769 Liver disease, unspecified: Secondary | ICD-10-CM | POA: Diagnosis not present

## 2016-06-07 DIAGNOSIS — E440 Moderate protein-calorie malnutrition: Secondary | ICD-10-CM | POA: Diagnosis not present

## 2016-06-07 DIAGNOSIS — N2581 Secondary hyperparathyroidism of renal origin: Secondary | ICD-10-CM | POA: Diagnosis not present

## 2016-06-07 DIAGNOSIS — D509 Iron deficiency anemia, unspecified: Secondary | ICD-10-CM | POA: Diagnosis not present

## 2016-06-07 DIAGNOSIS — N186 End stage renal disease: Secondary | ICD-10-CM | POA: Diagnosis not present

## 2016-06-08 DIAGNOSIS — E440 Moderate protein-calorie malnutrition: Secondary | ICD-10-CM | POA: Diagnosis not present

## 2016-06-08 DIAGNOSIS — K769 Liver disease, unspecified: Secondary | ICD-10-CM | POA: Diagnosis not present

## 2016-06-08 DIAGNOSIS — N2589 Other disorders resulting from impaired renal tubular function: Secondary | ICD-10-CM | POA: Diagnosis not present

## 2016-06-08 DIAGNOSIS — N2581 Secondary hyperparathyroidism of renal origin: Secondary | ICD-10-CM | POA: Diagnosis not present

## 2016-06-08 DIAGNOSIS — D509 Iron deficiency anemia, unspecified: Secondary | ICD-10-CM | POA: Diagnosis not present

## 2016-06-08 DIAGNOSIS — N186 End stage renal disease: Secondary | ICD-10-CM | POA: Diagnosis not present

## 2016-06-09 DIAGNOSIS — N2589 Other disorders resulting from impaired renal tubular function: Secondary | ICD-10-CM | POA: Diagnosis not present

## 2016-06-09 DIAGNOSIS — N2581 Secondary hyperparathyroidism of renal origin: Secondary | ICD-10-CM | POA: Diagnosis not present

## 2016-06-09 DIAGNOSIS — K769 Liver disease, unspecified: Secondary | ICD-10-CM | POA: Diagnosis not present

## 2016-06-09 DIAGNOSIS — D509 Iron deficiency anemia, unspecified: Secondary | ICD-10-CM | POA: Diagnosis not present

## 2016-06-09 DIAGNOSIS — N186 End stage renal disease: Secondary | ICD-10-CM | POA: Diagnosis not present

## 2016-06-09 DIAGNOSIS — E440 Moderate protein-calorie malnutrition: Secondary | ICD-10-CM | POA: Diagnosis not present

## 2016-06-10 DIAGNOSIS — N186 End stage renal disease: Secondary | ICD-10-CM | POA: Diagnosis not present

## 2016-06-10 DIAGNOSIS — D509 Iron deficiency anemia, unspecified: Secondary | ICD-10-CM | POA: Diagnosis not present

## 2016-06-10 DIAGNOSIS — E440 Moderate protein-calorie malnutrition: Secondary | ICD-10-CM | POA: Diagnosis not present

## 2016-06-10 DIAGNOSIS — N2589 Other disorders resulting from impaired renal tubular function: Secondary | ICD-10-CM | POA: Diagnosis not present

## 2016-06-10 DIAGNOSIS — K769 Liver disease, unspecified: Secondary | ICD-10-CM | POA: Diagnosis not present

## 2016-06-10 DIAGNOSIS — N2581 Secondary hyperparathyroidism of renal origin: Secondary | ICD-10-CM | POA: Diagnosis not present

## 2016-06-11 DIAGNOSIS — N2589 Other disorders resulting from impaired renal tubular function: Secondary | ICD-10-CM | POA: Diagnosis not present

## 2016-06-11 DIAGNOSIS — D509 Iron deficiency anemia, unspecified: Secondary | ICD-10-CM | POA: Diagnosis not present

## 2016-06-11 DIAGNOSIS — N186 End stage renal disease: Secondary | ICD-10-CM | POA: Diagnosis not present

## 2016-06-11 DIAGNOSIS — N2581 Secondary hyperparathyroidism of renal origin: Secondary | ICD-10-CM | POA: Diagnosis not present

## 2016-06-11 DIAGNOSIS — E440 Moderate protein-calorie malnutrition: Secondary | ICD-10-CM | POA: Diagnosis not present

## 2016-06-11 DIAGNOSIS — K769 Liver disease, unspecified: Secondary | ICD-10-CM | POA: Diagnosis not present

## 2016-06-12 DIAGNOSIS — K769 Liver disease, unspecified: Secondary | ICD-10-CM | POA: Diagnosis not present

## 2016-06-12 DIAGNOSIS — N2589 Other disorders resulting from impaired renal tubular function: Secondary | ICD-10-CM | POA: Diagnosis not present

## 2016-06-12 DIAGNOSIS — N186 End stage renal disease: Secondary | ICD-10-CM | POA: Diagnosis not present

## 2016-06-12 DIAGNOSIS — N2581 Secondary hyperparathyroidism of renal origin: Secondary | ICD-10-CM | POA: Diagnosis not present

## 2016-06-12 DIAGNOSIS — E440 Moderate protein-calorie malnutrition: Secondary | ICD-10-CM | POA: Diagnosis not present

## 2016-06-12 DIAGNOSIS — D509 Iron deficiency anemia, unspecified: Secondary | ICD-10-CM | POA: Diagnosis not present

## 2016-06-13 DIAGNOSIS — N2589 Other disorders resulting from impaired renal tubular function: Secondary | ICD-10-CM | POA: Diagnosis not present

## 2016-06-13 DIAGNOSIS — N186 End stage renal disease: Secondary | ICD-10-CM | POA: Diagnosis not present

## 2016-06-13 DIAGNOSIS — K769 Liver disease, unspecified: Secondary | ICD-10-CM | POA: Diagnosis not present

## 2016-06-13 DIAGNOSIS — D509 Iron deficiency anemia, unspecified: Secondary | ICD-10-CM | POA: Diagnosis not present

## 2016-06-13 DIAGNOSIS — E440 Moderate protein-calorie malnutrition: Secondary | ICD-10-CM | POA: Diagnosis not present

## 2016-06-13 DIAGNOSIS — N2581 Secondary hyperparathyroidism of renal origin: Secondary | ICD-10-CM | POA: Diagnosis not present

## 2016-06-14 DIAGNOSIS — D509 Iron deficiency anemia, unspecified: Secondary | ICD-10-CM | POA: Diagnosis not present

## 2016-06-14 DIAGNOSIS — N186 End stage renal disease: Secondary | ICD-10-CM | POA: Diagnosis not present

## 2016-06-14 DIAGNOSIS — N2589 Other disorders resulting from impaired renal tubular function: Secondary | ICD-10-CM | POA: Diagnosis not present

## 2016-06-14 DIAGNOSIS — K769 Liver disease, unspecified: Secondary | ICD-10-CM | POA: Diagnosis not present

## 2016-06-14 DIAGNOSIS — N2581 Secondary hyperparathyroidism of renal origin: Secondary | ICD-10-CM | POA: Diagnosis not present

## 2016-06-14 DIAGNOSIS — E440 Moderate protein-calorie malnutrition: Secondary | ICD-10-CM | POA: Diagnosis not present

## 2016-06-15 DIAGNOSIS — D509 Iron deficiency anemia, unspecified: Secondary | ICD-10-CM | POA: Diagnosis not present

## 2016-06-15 DIAGNOSIS — N186 End stage renal disease: Secondary | ICD-10-CM | POA: Diagnosis not present

## 2016-06-15 DIAGNOSIS — K769 Liver disease, unspecified: Secondary | ICD-10-CM | POA: Diagnosis not present

## 2016-06-15 DIAGNOSIS — N2581 Secondary hyperparathyroidism of renal origin: Secondary | ICD-10-CM | POA: Diagnosis not present

## 2016-06-15 DIAGNOSIS — N2589 Other disorders resulting from impaired renal tubular function: Secondary | ICD-10-CM | POA: Diagnosis not present

## 2016-06-15 DIAGNOSIS — E440 Moderate protein-calorie malnutrition: Secondary | ICD-10-CM | POA: Diagnosis not present

## 2016-06-16 DIAGNOSIS — K769 Liver disease, unspecified: Secondary | ICD-10-CM | POA: Diagnosis not present

## 2016-06-16 DIAGNOSIS — N186 End stage renal disease: Secondary | ICD-10-CM | POA: Diagnosis not present

## 2016-06-16 DIAGNOSIS — N2581 Secondary hyperparathyroidism of renal origin: Secondary | ICD-10-CM | POA: Diagnosis not present

## 2016-06-16 DIAGNOSIS — D509 Iron deficiency anemia, unspecified: Secondary | ICD-10-CM | POA: Diagnosis not present

## 2016-06-16 DIAGNOSIS — N2589 Other disorders resulting from impaired renal tubular function: Secondary | ICD-10-CM | POA: Diagnosis not present

## 2016-06-16 DIAGNOSIS — E440 Moderate protein-calorie malnutrition: Secondary | ICD-10-CM | POA: Diagnosis not present

## 2016-06-17 DIAGNOSIS — K769 Liver disease, unspecified: Secondary | ICD-10-CM | POA: Diagnosis not present

## 2016-06-17 DIAGNOSIS — E440 Moderate protein-calorie malnutrition: Secondary | ICD-10-CM | POA: Diagnosis not present

## 2016-06-17 DIAGNOSIS — D509 Iron deficiency anemia, unspecified: Secondary | ICD-10-CM | POA: Diagnosis not present

## 2016-06-17 DIAGNOSIS — N2581 Secondary hyperparathyroidism of renal origin: Secondary | ICD-10-CM | POA: Diagnosis not present

## 2016-06-17 DIAGNOSIS — N186 End stage renal disease: Secondary | ICD-10-CM | POA: Diagnosis not present

## 2016-06-17 DIAGNOSIS — N2589 Other disorders resulting from impaired renal tubular function: Secondary | ICD-10-CM | POA: Diagnosis not present

## 2016-06-18 DIAGNOSIS — N2581 Secondary hyperparathyroidism of renal origin: Secondary | ICD-10-CM | POA: Diagnosis not present

## 2016-06-18 DIAGNOSIS — D509 Iron deficiency anemia, unspecified: Secondary | ICD-10-CM | POA: Diagnosis not present

## 2016-06-18 DIAGNOSIS — K769 Liver disease, unspecified: Secondary | ICD-10-CM | POA: Diagnosis not present

## 2016-06-18 DIAGNOSIS — N2589 Other disorders resulting from impaired renal tubular function: Secondary | ICD-10-CM | POA: Diagnosis not present

## 2016-06-18 DIAGNOSIS — E440 Moderate protein-calorie malnutrition: Secondary | ICD-10-CM | POA: Diagnosis not present

## 2016-06-18 DIAGNOSIS — N186 End stage renal disease: Secondary | ICD-10-CM | POA: Diagnosis not present

## 2016-06-19 DIAGNOSIS — N2589 Other disorders resulting from impaired renal tubular function: Secondary | ICD-10-CM | POA: Diagnosis not present

## 2016-06-19 DIAGNOSIS — D509 Iron deficiency anemia, unspecified: Secondary | ICD-10-CM | POA: Diagnosis not present

## 2016-06-19 DIAGNOSIS — N2581 Secondary hyperparathyroidism of renal origin: Secondary | ICD-10-CM | POA: Diagnosis not present

## 2016-06-19 DIAGNOSIS — K769 Liver disease, unspecified: Secondary | ICD-10-CM | POA: Diagnosis not present

## 2016-06-19 DIAGNOSIS — E440 Moderate protein-calorie malnutrition: Secondary | ICD-10-CM | POA: Diagnosis not present

## 2016-06-19 DIAGNOSIS — N186 End stage renal disease: Secondary | ICD-10-CM | POA: Diagnosis not present

## 2016-06-20 DIAGNOSIS — N2581 Secondary hyperparathyroidism of renal origin: Secondary | ICD-10-CM | POA: Diagnosis not present

## 2016-06-20 DIAGNOSIS — E440 Moderate protein-calorie malnutrition: Secondary | ICD-10-CM | POA: Diagnosis not present

## 2016-06-20 DIAGNOSIS — N186 End stage renal disease: Secondary | ICD-10-CM | POA: Diagnosis not present

## 2016-06-20 DIAGNOSIS — K769 Liver disease, unspecified: Secondary | ICD-10-CM | POA: Diagnosis not present

## 2016-06-20 DIAGNOSIS — D509 Iron deficiency anemia, unspecified: Secondary | ICD-10-CM | POA: Diagnosis not present

## 2016-06-20 DIAGNOSIS — N2589 Other disorders resulting from impaired renal tubular function: Secondary | ICD-10-CM | POA: Diagnosis not present

## 2016-06-21 DIAGNOSIS — N186 End stage renal disease: Secondary | ICD-10-CM | POA: Diagnosis not present

## 2016-06-21 DIAGNOSIS — N2581 Secondary hyperparathyroidism of renal origin: Secondary | ICD-10-CM | POA: Diagnosis not present

## 2016-06-21 DIAGNOSIS — D509 Iron deficiency anemia, unspecified: Secondary | ICD-10-CM | POA: Diagnosis not present

## 2016-06-21 DIAGNOSIS — E440 Moderate protein-calorie malnutrition: Secondary | ICD-10-CM | POA: Diagnosis not present

## 2016-06-21 DIAGNOSIS — N2589 Other disorders resulting from impaired renal tubular function: Secondary | ICD-10-CM | POA: Diagnosis not present

## 2016-06-21 DIAGNOSIS — K769 Liver disease, unspecified: Secondary | ICD-10-CM | POA: Diagnosis not present

## 2016-06-22 DIAGNOSIS — E440 Moderate protein-calorie malnutrition: Secondary | ICD-10-CM | POA: Diagnosis not present

## 2016-06-22 DIAGNOSIS — N186 End stage renal disease: Secondary | ICD-10-CM | POA: Diagnosis not present

## 2016-06-22 DIAGNOSIS — K769 Liver disease, unspecified: Secondary | ICD-10-CM | POA: Diagnosis not present

## 2016-06-22 DIAGNOSIS — D509 Iron deficiency anemia, unspecified: Secondary | ICD-10-CM | POA: Diagnosis not present

## 2016-06-22 DIAGNOSIS — N2589 Other disorders resulting from impaired renal tubular function: Secondary | ICD-10-CM | POA: Diagnosis not present

## 2016-06-22 DIAGNOSIS — N2581 Secondary hyperparathyroidism of renal origin: Secondary | ICD-10-CM | POA: Diagnosis not present

## 2016-06-23 DIAGNOSIS — N2581 Secondary hyperparathyroidism of renal origin: Secondary | ICD-10-CM | POA: Diagnosis not present

## 2016-06-23 DIAGNOSIS — N2589 Other disorders resulting from impaired renal tubular function: Secondary | ICD-10-CM | POA: Diagnosis not present

## 2016-06-23 DIAGNOSIS — E440 Moderate protein-calorie malnutrition: Secondary | ICD-10-CM | POA: Diagnosis not present

## 2016-06-23 DIAGNOSIS — K769 Liver disease, unspecified: Secondary | ICD-10-CM | POA: Diagnosis not present

## 2016-06-23 DIAGNOSIS — D509 Iron deficiency anemia, unspecified: Secondary | ICD-10-CM | POA: Diagnosis not present

## 2016-06-23 DIAGNOSIS — N186 End stage renal disease: Secondary | ICD-10-CM | POA: Diagnosis not present

## 2016-06-24 DIAGNOSIS — D509 Iron deficiency anemia, unspecified: Secondary | ICD-10-CM | POA: Diagnosis not present

## 2016-06-24 DIAGNOSIS — E440 Moderate protein-calorie malnutrition: Secondary | ICD-10-CM | POA: Diagnosis not present

## 2016-06-24 DIAGNOSIS — N186 End stage renal disease: Secondary | ICD-10-CM | POA: Diagnosis not present

## 2016-06-24 DIAGNOSIS — N2581 Secondary hyperparathyroidism of renal origin: Secondary | ICD-10-CM | POA: Diagnosis not present

## 2016-06-24 DIAGNOSIS — N2589 Other disorders resulting from impaired renal tubular function: Secondary | ICD-10-CM | POA: Diagnosis not present

## 2016-06-24 DIAGNOSIS — K769 Liver disease, unspecified: Secondary | ICD-10-CM | POA: Diagnosis not present

## 2016-06-25 DIAGNOSIS — E440 Moderate protein-calorie malnutrition: Secondary | ICD-10-CM | POA: Diagnosis not present

## 2016-06-25 DIAGNOSIS — N2581 Secondary hyperparathyroidism of renal origin: Secondary | ICD-10-CM | POA: Diagnosis not present

## 2016-06-25 DIAGNOSIS — N2589 Other disorders resulting from impaired renal tubular function: Secondary | ICD-10-CM | POA: Diagnosis not present

## 2016-06-25 DIAGNOSIS — K769 Liver disease, unspecified: Secondary | ICD-10-CM | POA: Diagnosis not present

## 2016-06-25 DIAGNOSIS — N186 End stage renal disease: Secondary | ICD-10-CM | POA: Diagnosis not present

## 2016-06-25 DIAGNOSIS — D509 Iron deficiency anemia, unspecified: Secondary | ICD-10-CM | POA: Diagnosis not present

## 2016-06-26 DIAGNOSIS — Z992 Dependence on renal dialysis: Secondary | ICD-10-CM | POA: Diagnosis not present

## 2016-06-26 DIAGNOSIS — N186 End stage renal disease: Secondary | ICD-10-CM | POA: Diagnosis not present

## 2016-06-26 DIAGNOSIS — K769 Liver disease, unspecified: Secondary | ICD-10-CM | POA: Diagnosis not present

## 2016-06-26 DIAGNOSIS — I129 Hypertensive chronic kidney disease with stage 1 through stage 4 chronic kidney disease, or unspecified chronic kidney disease: Secondary | ICD-10-CM | POA: Diagnosis not present

## 2016-06-26 DIAGNOSIS — D509 Iron deficiency anemia, unspecified: Secondary | ICD-10-CM | POA: Diagnosis not present

## 2016-06-26 DIAGNOSIS — N2589 Other disorders resulting from impaired renal tubular function: Secondary | ICD-10-CM | POA: Diagnosis not present

## 2016-06-26 DIAGNOSIS — E440 Moderate protein-calorie malnutrition: Secondary | ICD-10-CM | POA: Diagnosis not present

## 2016-06-26 DIAGNOSIS — N2581 Secondary hyperparathyroidism of renal origin: Secondary | ICD-10-CM | POA: Diagnosis not present

## 2016-06-27 DIAGNOSIS — Z5189 Encounter for other specified aftercare: Secondary | ICD-10-CM | POA: Diagnosis not present

## 2016-06-27 DIAGNOSIS — K769 Liver disease, unspecified: Secondary | ICD-10-CM | POA: Diagnosis not present

## 2016-06-27 DIAGNOSIS — Z79899 Other long term (current) drug therapy: Secondary | ICD-10-CM | POA: Diagnosis not present

## 2016-06-27 DIAGNOSIS — N186 End stage renal disease: Secondary | ICD-10-CM | POA: Diagnosis not present

## 2016-06-27 DIAGNOSIS — D631 Anemia in chronic kidney disease: Secondary | ICD-10-CM | POA: Diagnosis not present

## 2016-06-27 DIAGNOSIS — D509 Iron deficiency anemia, unspecified: Secondary | ICD-10-CM | POA: Diagnosis not present

## 2016-06-27 DIAGNOSIS — N2581 Secondary hyperparathyroidism of renal origin: Secondary | ICD-10-CM | POA: Diagnosis not present

## 2016-06-27 DIAGNOSIS — E440 Moderate protein-calorie malnutrition: Secondary | ICD-10-CM | POA: Diagnosis not present

## 2016-06-28 DIAGNOSIS — D631 Anemia in chronic kidney disease: Secondary | ICD-10-CM | POA: Diagnosis not present

## 2016-06-28 DIAGNOSIS — Z79899 Other long term (current) drug therapy: Secondary | ICD-10-CM | POA: Diagnosis not present

## 2016-06-28 DIAGNOSIS — D509 Iron deficiency anemia, unspecified: Secondary | ICD-10-CM | POA: Diagnosis not present

## 2016-06-28 DIAGNOSIS — E440 Moderate protein-calorie malnutrition: Secondary | ICD-10-CM | POA: Diagnosis not present

## 2016-06-28 DIAGNOSIS — N186 End stage renal disease: Secondary | ICD-10-CM | POA: Diagnosis not present

## 2016-06-28 DIAGNOSIS — K769 Liver disease, unspecified: Secondary | ICD-10-CM | POA: Diagnosis not present

## 2016-06-29 DIAGNOSIS — R8299 Other abnormal findings in urine: Secondary | ICD-10-CM | POA: Diagnosis not present

## 2016-06-29 DIAGNOSIS — E440 Moderate protein-calorie malnutrition: Secondary | ICD-10-CM | POA: Diagnosis not present

## 2016-06-29 DIAGNOSIS — K769 Liver disease, unspecified: Secondary | ICD-10-CM | POA: Diagnosis not present

## 2016-06-29 DIAGNOSIS — Z79899 Other long term (current) drug therapy: Secondary | ICD-10-CM | POA: Diagnosis not present

## 2016-06-29 DIAGNOSIS — D631 Anemia in chronic kidney disease: Secondary | ICD-10-CM | POA: Diagnosis not present

## 2016-06-29 DIAGNOSIS — D509 Iron deficiency anemia, unspecified: Secondary | ICD-10-CM | POA: Diagnosis not present

## 2016-06-29 DIAGNOSIS — N186 End stage renal disease: Secondary | ICD-10-CM | POA: Diagnosis not present

## 2016-06-30 DIAGNOSIS — K769 Liver disease, unspecified: Secondary | ICD-10-CM | POA: Diagnosis not present

## 2016-06-30 DIAGNOSIS — E440 Moderate protein-calorie malnutrition: Secondary | ICD-10-CM | POA: Diagnosis not present

## 2016-06-30 DIAGNOSIS — D509 Iron deficiency anemia, unspecified: Secondary | ICD-10-CM | POA: Diagnosis not present

## 2016-06-30 DIAGNOSIS — D631 Anemia in chronic kidney disease: Secondary | ICD-10-CM | POA: Diagnosis not present

## 2016-06-30 DIAGNOSIS — N186 End stage renal disease: Secondary | ICD-10-CM | POA: Diagnosis not present

## 2016-06-30 DIAGNOSIS — Z79899 Other long term (current) drug therapy: Secondary | ICD-10-CM | POA: Diagnosis not present

## 2016-07-01 DIAGNOSIS — N186 End stage renal disease: Secondary | ICD-10-CM | POA: Diagnosis not present

## 2016-07-01 DIAGNOSIS — D631 Anemia in chronic kidney disease: Secondary | ICD-10-CM | POA: Diagnosis not present

## 2016-07-01 DIAGNOSIS — Z79899 Other long term (current) drug therapy: Secondary | ICD-10-CM | POA: Diagnosis not present

## 2016-07-01 DIAGNOSIS — K769 Liver disease, unspecified: Secondary | ICD-10-CM | POA: Diagnosis not present

## 2016-07-01 DIAGNOSIS — E440 Moderate protein-calorie malnutrition: Secondary | ICD-10-CM | POA: Diagnosis not present

## 2016-07-01 DIAGNOSIS — D509 Iron deficiency anemia, unspecified: Secondary | ICD-10-CM | POA: Diagnosis not present

## 2016-07-02 DIAGNOSIS — Z79899 Other long term (current) drug therapy: Secondary | ICD-10-CM | POA: Diagnosis not present

## 2016-07-02 DIAGNOSIS — E440 Moderate protein-calorie malnutrition: Secondary | ICD-10-CM | POA: Diagnosis not present

## 2016-07-02 DIAGNOSIS — N186 End stage renal disease: Secondary | ICD-10-CM | POA: Diagnosis not present

## 2016-07-02 DIAGNOSIS — K769 Liver disease, unspecified: Secondary | ICD-10-CM | POA: Diagnosis not present

## 2016-07-02 DIAGNOSIS — D631 Anemia in chronic kidney disease: Secondary | ICD-10-CM | POA: Diagnosis not present

## 2016-07-02 DIAGNOSIS — D509 Iron deficiency anemia, unspecified: Secondary | ICD-10-CM | POA: Diagnosis not present

## 2016-07-03 DIAGNOSIS — D509 Iron deficiency anemia, unspecified: Secondary | ICD-10-CM | POA: Diagnosis not present

## 2016-07-03 DIAGNOSIS — Z79899 Other long term (current) drug therapy: Secondary | ICD-10-CM | POA: Diagnosis not present

## 2016-07-03 DIAGNOSIS — K769 Liver disease, unspecified: Secondary | ICD-10-CM | POA: Diagnosis not present

## 2016-07-03 DIAGNOSIS — D631 Anemia in chronic kidney disease: Secondary | ICD-10-CM | POA: Diagnosis not present

## 2016-07-03 DIAGNOSIS — N186 End stage renal disease: Secondary | ICD-10-CM | POA: Diagnosis not present

## 2016-07-03 DIAGNOSIS — E440 Moderate protein-calorie malnutrition: Secondary | ICD-10-CM | POA: Diagnosis not present

## 2016-07-04 DIAGNOSIS — K769 Liver disease, unspecified: Secondary | ICD-10-CM | POA: Diagnosis not present

## 2016-07-04 DIAGNOSIS — D509 Iron deficiency anemia, unspecified: Secondary | ICD-10-CM | POA: Diagnosis not present

## 2016-07-04 DIAGNOSIS — N186 End stage renal disease: Secondary | ICD-10-CM | POA: Diagnosis not present

## 2016-07-04 DIAGNOSIS — Z79899 Other long term (current) drug therapy: Secondary | ICD-10-CM | POA: Diagnosis not present

## 2016-07-04 DIAGNOSIS — D631 Anemia in chronic kidney disease: Secondary | ICD-10-CM | POA: Diagnosis not present

## 2016-07-04 DIAGNOSIS — E440 Moderate protein-calorie malnutrition: Secondary | ICD-10-CM | POA: Diagnosis not present

## 2016-07-05 DIAGNOSIS — Z79899 Other long term (current) drug therapy: Secondary | ICD-10-CM | POA: Diagnosis not present

## 2016-07-05 DIAGNOSIS — N186 End stage renal disease: Secondary | ICD-10-CM | POA: Diagnosis not present

## 2016-07-05 DIAGNOSIS — K769 Liver disease, unspecified: Secondary | ICD-10-CM | POA: Diagnosis not present

## 2016-07-05 DIAGNOSIS — D509 Iron deficiency anemia, unspecified: Secondary | ICD-10-CM | POA: Diagnosis not present

## 2016-07-05 DIAGNOSIS — E440 Moderate protein-calorie malnutrition: Secondary | ICD-10-CM | POA: Diagnosis not present

## 2016-07-05 DIAGNOSIS — D631 Anemia in chronic kidney disease: Secondary | ICD-10-CM | POA: Diagnosis not present

## 2016-07-06 DIAGNOSIS — K769 Liver disease, unspecified: Secondary | ICD-10-CM | POA: Diagnosis not present

## 2016-07-06 DIAGNOSIS — D509 Iron deficiency anemia, unspecified: Secondary | ICD-10-CM | POA: Diagnosis not present

## 2016-07-06 DIAGNOSIS — N186 End stage renal disease: Secondary | ICD-10-CM | POA: Diagnosis not present

## 2016-07-06 DIAGNOSIS — Z79899 Other long term (current) drug therapy: Secondary | ICD-10-CM | POA: Diagnosis not present

## 2016-07-06 DIAGNOSIS — E440 Moderate protein-calorie malnutrition: Secondary | ICD-10-CM | POA: Diagnosis not present

## 2016-07-06 DIAGNOSIS — D631 Anemia in chronic kidney disease: Secondary | ICD-10-CM | POA: Diagnosis not present

## 2016-07-07 DIAGNOSIS — N186 End stage renal disease: Secondary | ICD-10-CM | POA: Diagnosis not present

## 2016-07-07 DIAGNOSIS — Z79899 Other long term (current) drug therapy: Secondary | ICD-10-CM | POA: Diagnosis not present

## 2016-07-07 DIAGNOSIS — K769 Liver disease, unspecified: Secondary | ICD-10-CM | POA: Diagnosis not present

## 2016-07-07 DIAGNOSIS — D509 Iron deficiency anemia, unspecified: Secondary | ICD-10-CM | POA: Diagnosis not present

## 2016-07-07 DIAGNOSIS — E440 Moderate protein-calorie malnutrition: Secondary | ICD-10-CM | POA: Diagnosis not present

## 2016-07-07 DIAGNOSIS — D631 Anemia in chronic kidney disease: Secondary | ICD-10-CM | POA: Diagnosis not present

## 2016-07-08 DIAGNOSIS — K769 Liver disease, unspecified: Secondary | ICD-10-CM | POA: Diagnosis not present

## 2016-07-08 DIAGNOSIS — D631 Anemia in chronic kidney disease: Secondary | ICD-10-CM | POA: Diagnosis not present

## 2016-07-08 DIAGNOSIS — N186 End stage renal disease: Secondary | ICD-10-CM | POA: Diagnosis not present

## 2016-07-08 DIAGNOSIS — Z79899 Other long term (current) drug therapy: Secondary | ICD-10-CM | POA: Diagnosis not present

## 2016-07-08 DIAGNOSIS — D509 Iron deficiency anemia, unspecified: Secondary | ICD-10-CM | POA: Diagnosis not present

## 2016-07-08 DIAGNOSIS — E440 Moderate protein-calorie malnutrition: Secondary | ICD-10-CM | POA: Diagnosis not present

## 2016-07-09 DIAGNOSIS — D509 Iron deficiency anemia, unspecified: Secondary | ICD-10-CM | POA: Diagnosis not present

## 2016-07-09 DIAGNOSIS — E440 Moderate protein-calorie malnutrition: Secondary | ICD-10-CM | POA: Diagnosis not present

## 2016-07-09 DIAGNOSIS — N186 End stage renal disease: Secondary | ICD-10-CM | POA: Diagnosis not present

## 2016-07-09 DIAGNOSIS — K769 Liver disease, unspecified: Secondary | ICD-10-CM | POA: Diagnosis not present

## 2016-07-09 DIAGNOSIS — Z79899 Other long term (current) drug therapy: Secondary | ICD-10-CM | POA: Diagnosis not present

## 2016-07-09 DIAGNOSIS — D631 Anemia in chronic kidney disease: Secondary | ICD-10-CM | POA: Diagnosis not present

## 2016-07-10 DIAGNOSIS — K769 Liver disease, unspecified: Secondary | ICD-10-CM | POA: Diagnosis not present

## 2016-07-10 DIAGNOSIS — D509 Iron deficiency anemia, unspecified: Secondary | ICD-10-CM | POA: Diagnosis not present

## 2016-07-10 DIAGNOSIS — E440 Moderate protein-calorie malnutrition: Secondary | ICD-10-CM | POA: Diagnosis not present

## 2016-07-10 DIAGNOSIS — Z79899 Other long term (current) drug therapy: Secondary | ICD-10-CM | POA: Diagnosis not present

## 2016-07-10 DIAGNOSIS — D631 Anemia in chronic kidney disease: Secondary | ICD-10-CM | POA: Diagnosis not present

## 2016-07-10 DIAGNOSIS — N186 End stage renal disease: Secondary | ICD-10-CM | POA: Diagnosis not present

## 2016-07-11 DIAGNOSIS — E440 Moderate protein-calorie malnutrition: Secondary | ICD-10-CM | POA: Diagnosis not present

## 2016-07-11 DIAGNOSIS — N186 End stage renal disease: Secondary | ICD-10-CM | POA: Diagnosis not present

## 2016-07-11 DIAGNOSIS — Z79899 Other long term (current) drug therapy: Secondary | ICD-10-CM | POA: Diagnosis not present

## 2016-07-11 DIAGNOSIS — K769 Liver disease, unspecified: Secondary | ICD-10-CM | POA: Diagnosis not present

## 2016-07-11 DIAGNOSIS — D631 Anemia in chronic kidney disease: Secondary | ICD-10-CM | POA: Diagnosis not present

## 2016-07-11 DIAGNOSIS — D509 Iron deficiency anemia, unspecified: Secondary | ICD-10-CM | POA: Diagnosis not present

## 2016-07-12 DIAGNOSIS — K769 Liver disease, unspecified: Secondary | ICD-10-CM | POA: Diagnosis not present

## 2016-07-12 DIAGNOSIS — D509 Iron deficiency anemia, unspecified: Secondary | ICD-10-CM | POA: Diagnosis not present

## 2016-07-12 DIAGNOSIS — D631 Anemia in chronic kidney disease: Secondary | ICD-10-CM | POA: Diagnosis not present

## 2016-07-12 DIAGNOSIS — Z79899 Other long term (current) drug therapy: Secondary | ICD-10-CM | POA: Diagnosis not present

## 2016-07-12 DIAGNOSIS — E440 Moderate protein-calorie malnutrition: Secondary | ICD-10-CM | POA: Diagnosis not present

## 2016-07-12 DIAGNOSIS — N186 End stage renal disease: Secondary | ICD-10-CM | POA: Diagnosis not present

## 2016-07-13 DIAGNOSIS — E440 Moderate protein-calorie malnutrition: Secondary | ICD-10-CM | POA: Diagnosis not present

## 2016-07-13 DIAGNOSIS — D631 Anemia in chronic kidney disease: Secondary | ICD-10-CM | POA: Diagnosis not present

## 2016-07-13 DIAGNOSIS — N186 End stage renal disease: Secondary | ICD-10-CM | POA: Diagnosis not present

## 2016-07-13 DIAGNOSIS — K769 Liver disease, unspecified: Secondary | ICD-10-CM | POA: Diagnosis not present

## 2016-07-13 DIAGNOSIS — Z79899 Other long term (current) drug therapy: Secondary | ICD-10-CM | POA: Diagnosis not present

## 2016-07-13 DIAGNOSIS — D509 Iron deficiency anemia, unspecified: Secondary | ICD-10-CM | POA: Diagnosis not present

## 2016-07-14 DIAGNOSIS — K769 Liver disease, unspecified: Secondary | ICD-10-CM | POA: Diagnosis not present

## 2016-07-14 DIAGNOSIS — Z79899 Other long term (current) drug therapy: Secondary | ICD-10-CM | POA: Diagnosis not present

## 2016-07-14 DIAGNOSIS — D509 Iron deficiency anemia, unspecified: Secondary | ICD-10-CM | POA: Diagnosis not present

## 2016-07-14 DIAGNOSIS — E440 Moderate protein-calorie malnutrition: Secondary | ICD-10-CM | POA: Diagnosis not present

## 2016-07-14 DIAGNOSIS — D631 Anemia in chronic kidney disease: Secondary | ICD-10-CM | POA: Diagnosis not present

## 2016-07-14 DIAGNOSIS — N186 End stage renal disease: Secondary | ICD-10-CM | POA: Diagnosis not present

## 2016-07-15 DIAGNOSIS — Z79899 Other long term (current) drug therapy: Secondary | ICD-10-CM | POA: Diagnosis not present

## 2016-07-15 DIAGNOSIS — K769 Liver disease, unspecified: Secondary | ICD-10-CM | POA: Diagnosis not present

## 2016-07-15 DIAGNOSIS — D509 Iron deficiency anemia, unspecified: Secondary | ICD-10-CM | POA: Diagnosis not present

## 2016-07-15 DIAGNOSIS — E440 Moderate protein-calorie malnutrition: Secondary | ICD-10-CM | POA: Diagnosis not present

## 2016-07-15 DIAGNOSIS — D631 Anemia in chronic kidney disease: Secondary | ICD-10-CM | POA: Diagnosis not present

## 2016-07-15 DIAGNOSIS — N186 End stage renal disease: Secondary | ICD-10-CM | POA: Diagnosis not present

## 2016-07-16 DIAGNOSIS — N186 End stage renal disease: Secondary | ICD-10-CM | POA: Diagnosis not present

## 2016-07-16 DIAGNOSIS — K769 Liver disease, unspecified: Secondary | ICD-10-CM | POA: Diagnosis not present

## 2016-07-16 DIAGNOSIS — D509 Iron deficiency anemia, unspecified: Secondary | ICD-10-CM | POA: Diagnosis not present

## 2016-07-16 DIAGNOSIS — D631 Anemia in chronic kidney disease: Secondary | ICD-10-CM | POA: Diagnosis not present

## 2016-07-16 DIAGNOSIS — E440 Moderate protein-calorie malnutrition: Secondary | ICD-10-CM | POA: Diagnosis not present

## 2016-07-16 DIAGNOSIS — Z79899 Other long term (current) drug therapy: Secondary | ICD-10-CM | POA: Diagnosis not present

## 2016-07-17 DIAGNOSIS — N186 End stage renal disease: Secondary | ICD-10-CM | POA: Diagnosis not present

## 2016-07-17 DIAGNOSIS — D509 Iron deficiency anemia, unspecified: Secondary | ICD-10-CM | POA: Diagnosis not present

## 2016-07-17 DIAGNOSIS — E440 Moderate protein-calorie malnutrition: Secondary | ICD-10-CM | POA: Diagnosis not present

## 2016-07-17 DIAGNOSIS — D631 Anemia in chronic kidney disease: Secondary | ICD-10-CM | POA: Diagnosis not present

## 2016-07-17 DIAGNOSIS — Z79899 Other long term (current) drug therapy: Secondary | ICD-10-CM | POA: Diagnosis not present

## 2016-07-17 DIAGNOSIS — K769 Liver disease, unspecified: Secondary | ICD-10-CM | POA: Diagnosis not present

## 2016-07-18 DIAGNOSIS — D509 Iron deficiency anemia, unspecified: Secondary | ICD-10-CM | POA: Diagnosis not present

## 2016-07-18 DIAGNOSIS — Z79899 Other long term (current) drug therapy: Secondary | ICD-10-CM | POA: Diagnosis not present

## 2016-07-18 DIAGNOSIS — N186 End stage renal disease: Secondary | ICD-10-CM | POA: Diagnosis not present

## 2016-07-18 DIAGNOSIS — E440 Moderate protein-calorie malnutrition: Secondary | ICD-10-CM | POA: Diagnosis not present

## 2016-07-18 DIAGNOSIS — K769 Liver disease, unspecified: Secondary | ICD-10-CM | POA: Diagnosis not present

## 2016-07-18 DIAGNOSIS — D631 Anemia in chronic kidney disease: Secondary | ICD-10-CM | POA: Diagnosis not present

## 2016-07-19 DIAGNOSIS — Z79899 Other long term (current) drug therapy: Secondary | ICD-10-CM | POA: Diagnosis not present

## 2016-07-19 DIAGNOSIS — E440 Moderate protein-calorie malnutrition: Secondary | ICD-10-CM | POA: Diagnosis not present

## 2016-07-19 DIAGNOSIS — D509 Iron deficiency anemia, unspecified: Secondary | ICD-10-CM | POA: Diagnosis not present

## 2016-07-19 DIAGNOSIS — N186 End stage renal disease: Secondary | ICD-10-CM | POA: Diagnosis not present

## 2016-07-19 DIAGNOSIS — K769 Liver disease, unspecified: Secondary | ICD-10-CM | POA: Diagnosis not present

## 2016-07-19 DIAGNOSIS — D631 Anemia in chronic kidney disease: Secondary | ICD-10-CM | POA: Diagnosis not present

## 2016-07-20 DIAGNOSIS — D631 Anemia in chronic kidney disease: Secondary | ICD-10-CM | POA: Diagnosis not present

## 2016-07-20 DIAGNOSIS — N186 End stage renal disease: Secondary | ICD-10-CM | POA: Diagnosis not present

## 2016-07-20 DIAGNOSIS — E440 Moderate protein-calorie malnutrition: Secondary | ICD-10-CM | POA: Diagnosis not present

## 2016-07-20 DIAGNOSIS — K769 Liver disease, unspecified: Secondary | ICD-10-CM | POA: Diagnosis not present

## 2016-07-20 DIAGNOSIS — Z79899 Other long term (current) drug therapy: Secondary | ICD-10-CM | POA: Diagnosis not present

## 2016-07-20 DIAGNOSIS — D509 Iron deficiency anemia, unspecified: Secondary | ICD-10-CM | POA: Diagnosis not present

## 2016-07-21 DIAGNOSIS — N186 End stage renal disease: Secondary | ICD-10-CM | POA: Diagnosis not present

## 2016-07-21 DIAGNOSIS — D509 Iron deficiency anemia, unspecified: Secondary | ICD-10-CM | POA: Diagnosis not present

## 2016-07-21 DIAGNOSIS — Z79899 Other long term (current) drug therapy: Secondary | ICD-10-CM | POA: Diagnosis not present

## 2016-07-21 DIAGNOSIS — D631 Anemia in chronic kidney disease: Secondary | ICD-10-CM | POA: Diagnosis not present

## 2016-07-21 DIAGNOSIS — E440 Moderate protein-calorie malnutrition: Secondary | ICD-10-CM | POA: Diagnosis not present

## 2016-07-21 DIAGNOSIS — K769 Liver disease, unspecified: Secondary | ICD-10-CM | POA: Diagnosis not present

## 2016-07-22 DIAGNOSIS — Z79899 Other long term (current) drug therapy: Secondary | ICD-10-CM | POA: Diagnosis not present

## 2016-07-22 DIAGNOSIS — N186 End stage renal disease: Secondary | ICD-10-CM | POA: Diagnosis not present

## 2016-07-22 DIAGNOSIS — D631 Anemia in chronic kidney disease: Secondary | ICD-10-CM | POA: Diagnosis not present

## 2016-07-22 DIAGNOSIS — D509 Iron deficiency anemia, unspecified: Secondary | ICD-10-CM | POA: Diagnosis not present

## 2016-07-22 DIAGNOSIS — E440 Moderate protein-calorie malnutrition: Secondary | ICD-10-CM | POA: Diagnosis not present

## 2016-07-22 DIAGNOSIS — K769 Liver disease, unspecified: Secondary | ICD-10-CM | POA: Diagnosis not present

## 2016-07-23 DIAGNOSIS — D509 Iron deficiency anemia, unspecified: Secondary | ICD-10-CM | POA: Diagnosis not present

## 2016-07-23 DIAGNOSIS — N186 End stage renal disease: Secondary | ICD-10-CM | POA: Diagnosis not present

## 2016-07-23 DIAGNOSIS — E440 Moderate protein-calorie malnutrition: Secondary | ICD-10-CM | POA: Diagnosis not present

## 2016-07-23 DIAGNOSIS — K769 Liver disease, unspecified: Secondary | ICD-10-CM | POA: Diagnosis not present

## 2016-07-23 DIAGNOSIS — Z79899 Other long term (current) drug therapy: Secondary | ICD-10-CM | POA: Diagnosis not present

## 2016-07-23 DIAGNOSIS — D631 Anemia in chronic kidney disease: Secondary | ICD-10-CM | POA: Diagnosis not present

## 2016-07-24 DIAGNOSIS — D509 Iron deficiency anemia, unspecified: Secondary | ICD-10-CM | POA: Diagnosis not present

## 2016-07-24 DIAGNOSIS — K769 Liver disease, unspecified: Secondary | ICD-10-CM | POA: Diagnosis not present

## 2016-07-24 DIAGNOSIS — E440 Moderate protein-calorie malnutrition: Secondary | ICD-10-CM | POA: Diagnosis not present

## 2016-07-24 DIAGNOSIS — N186 End stage renal disease: Secondary | ICD-10-CM | POA: Diagnosis not present

## 2016-07-24 DIAGNOSIS — D631 Anemia in chronic kidney disease: Secondary | ICD-10-CM | POA: Diagnosis not present

## 2016-07-24 DIAGNOSIS — Z79899 Other long term (current) drug therapy: Secondary | ICD-10-CM | POA: Diagnosis not present

## 2016-07-25 DIAGNOSIS — N186 End stage renal disease: Secondary | ICD-10-CM | POA: Diagnosis not present

## 2016-07-25 DIAGNOSIS — E440 Moderate protein-calorie malnutrition: Secondary | ICD-10-CM | POA: Diagnosis not present

## 2016-07-25 DIAGNOSIS — D631 Anemia in chronic kidney disease: Secondary | ICD-10-CM | POA: Diagnosis not present

## 2016-07-25 DIAGNOSIS — D509 Iron deficiency anemia, unspecified: Secondary | ICD-10-CM | POA: Diagnosis not present

## 2016-07-25 DIAGNOSIS — Z79899 Other long term (current) drug therapy: Secondary | ICD-10-CM | POA: Diagnosis not present

## 2016-07-25 DIAGNOSIS — K769 Liver disease, unspecified: Secondary | ICD-10-CM | POA: Diagnosis not present

## 2016-07-26 DIAGNOSIS — D509 Iron deficiency anemia, unspecified: Secondary | ICD-10-CM | POA: Diagnosis not present

## 2016-07-26 DIAGNOSIS — N186 End stage renal disease: Secondary | ICD-10-CM | POA: Diagnosis not present

## 2016-07-26 DIAGNOSIS — Z79899 Other long term (current) drug therapy: Secondary | ICD-10-CM | POA: Diagnosis not present

## 2016-07-26 DIAGNOSIS — D631 Anemia in chronic kidney disease: Secondary | ICD-10-CM | POA: Diagnosis not present

## 2016-07-26 DIAGNOSIS — K769 Liver disease, unspecified: Secondary | ICD-10-CM | POA: Diagnosis not present

## 2016-07-26 DIAGNOSIS — E440 Moderate protein-calorie malnutrition: Secondary | ICD-10-CM | POA: Diagnosis not present

## 2016-07-27 DIAGNOSIS — E440 Moderate protein-calorie malnutrition: Secondary | ICD-10-CM | POA: Diagnosis not present

## 2016-07-27 DIAGNOSIS — I129 Hypertensive chronic kidney disease with stage 1 through stage 4 chronic kidney disease, or unspecified chronic kidney disease: Secondary | ICD-10-CM | POA: Diagnosis not present

## 2016-07-27 DIAGNOSIS — D509 Iron deficiency anemia, unspecified: Secondary | ICD-10-CM | POA: Diagnosis not present

## 2016-07-27 DIAGNOSIS — N186 End stage renal disease: Secondary | ICD-10-CM | POA: Diagnosis not present

## 2016-07-27 DIAGNOSIS — Z992 Dependence on renal dialysis: Secondary | ICD-10-CM | POA: Diagnosis not present

## 2016-07-27 DIAGNOSIS — Z79899 Other long term (current) drug therapy: Secondary | ICD-10-CM | POA: Diagnosis not present

## 2016-07-27 DIAGNOSIS — K769 Liver disease, unspecified: Secondary | ICD-10-CM | POA: Diagnosis not present

## 2016-07-27 DIAGNOSIS — D631 Anemia in chronic kidney disease: Secondary | ICD-10-CM | POA: Diagnosis not present

## 2016-07-28 DIAGNOSIS — K769 Liver disease, unspecified: Secondary | ICD-10-CM | POA: Diagnosis not present

## 2016-07-28 DIAGNOSIS — N2581 Secondary hyperparathyroidism of renal origin: Secondary | ICD-10-CM | POA: Diagnosis not present

## 2016-07-28 DIAGNOSIS — Z79899 Other long term (current) drug therapy: Secondary | ICD-10-CM | POA: Diagnosis not present

## 2016-07-28 DIAGNOSIS — N186 End stage renal disease: Secondary | ICD-10-CM | POA: Diagnosis not present

## 2016-07-28 DIAGNOSIS — D631 Anemia in chronic kidney disease: Secondary | ICD-10-CM | POA: Diagnosis not present

## 2016-07-28 DIAGNOSIS — Z5189 Encounter for other specified aftercare: Secondary | ICD-10-CM | POA: Diagnosis not present

## 2016-07-28 DIAGNOSIS — E440 Moderate protein-calorie malnutrition: Secondary | ICD-10-CM | POA: Diagnosis not present

## 2016-07-28 DIAGNOSIS — D509 Iron deficiency anemia, unspecified: Secondary | ICD-10-CM | POA: Diagnosis not present

## 2016-07-29 DIAGNOSIS — D631 Anemia in chronic kidney disease: Secondary | ICD-10-CM | POA: Diagnosis not present

## 2016-07-29 DIAGNOSIS — E440 Moderate protein-calorie malnutrition: Secondary | ICD-10-CM | POA: Diagnosis not present

## 2016-07-29 DIAGNOSIS — N2581 Secondary hyperparathyroidism of renal origin: Secondary | ICD-10-CM | POA: Diagnosis not present

## 2016-07-29 DIAGNOSIS — K769 Liver disease, unspecified: Secondary | ICD-10-CM | POA: Diagnosis not present

## 2016-07-29 DIAGNOSIS — D509 Iron deficiency anemia, unspecified: Secondary | ICD-10-CM | POA: Diagnosis not present

## 2016-07-29 DIAGNOSIS — N186 End stage renal disease: Secondary | ICD-10-CM | POA: Diagnosis not present

## 2016-07-30 DIAGNOSIS — E440 Moderate protein-calorie malnutrition: Secondary | ICD-10-CM | POA: Diagnosis not present

## 2016-07-30 DIAGNOSIS — D509 Iron deficiency anemia, unspecified: Secondary | ICD-10-CM | POA: Diagnosis not present

## 2016-07-30 DIAGNOSIS — D631 Anemia in chronic kidney disease: Secondary | ICD-10-CM | POA: Diagnosis not present

## 2016-07-30 DIAGNOSIS — K769 Liver disease, unspecified: Secondary | ICD-10-CM | POA: Diagnosis not present

## 2016-07-30 DIAGNOSIS — N186 End stage renal disease: Secondary | ICD-10-CM | POA: Diagnosis not present

## 2016-07-30 DIAGNOSIS — N2581 Secondary hyperparathyroidism of renal origin: Secondary | ICD-10-CM | POA: Diagnosis not present

## 2016-07-31 DIAGNOSIS — E440 Moderate protein-calorie malnutrition: Secondary | ICD-10-CM | POA: Diagnosis not present

## 2016-07-31 DIAGNOSIS — N186 End stage renal disease: Secondary | ICD-10-CM | POA: Diagnosis not present

## 2016-07-31 DIAGNOSIS — K769 Liver disease, unspecified: Secondary | ICD-10-CM | POA: Diagnosis not present

## 2016-07-31 DIAGNOSIS — N2581 Secondary hyperparathyroidism of renal origin: Secondary | ICD-10-CM | POA: Diagnosis not present

## 2016-07-31 DIAGNOSIS — D509 Iron deficiency anemia, unspecified: Secondary | ICD-10-CM | POA: Diagnosis not present

## 2016-07-31 DIAGNOSIS — D631 Anemia in chronic kidney disease: Secondary | ICD-10-CM | POA: Diagnosis not present

## 2016-08-01 DIAGNOSIS — D631 Anemia in chronic kidney disease: Secondary | ICD-10-CM | POA: Diagnosis not present

## 2016-08-01 DIAGNOSIS — N2581 Secondary hyperparathyroidism of renal origin: Secondary | ICD-10-CM | POA: Diagnosis not present

## 2016-08-01 DIAGNOSIS — E440 Moderate protein-calorie malnutrition: Secondary | ICD-10-CM | POA: Diagnosis not present

## 2016-08-01 DIAGNOSIS — D509 Iron deficiency anemia, unspecified: Secondary | ICD-10-CM | POA: Diagnosis not present

## 2016-08-01 DIAGNOSIS — N186 End stage renal disease: Secondary | ICD-10-CM | POA: Diagnosis not present

## 2016-08-01 DIAGNOSIS — K769 Liver disease, unspecified: Secondary | ICD-10-CM | POA: Diagnosis not present

## 2016-08-02 DIAGNOSIS — N186 End stage renal disease: Secondary | ICD-10-CM | POA: Diagnosis not present

## 2016-08-02 DIAGNOSIS — K769 Liver disease, unspecified: Secondary | ICD-10-CM | POA: Diagnosis not present

## 2016-08-02 DIAGNOSIS — D631 Anemia in chronic kidney disease: Secondary | ICD-10-CM | POA: Diagnosis not present

## 2016-08-02 DIAGNOSIS — N2581 Secondary hyperparathyroidism of renal origin: Secondary | ICD-10-CM | POA: Diagnosis not present

## 2016-08-02 DIAGNOSIS — E440 Moderate protein-calorie malnutrition: Secondary | ICD-10-CM | POA: Diagnosis not present

## 2016-08-02 DIAGNOSIS — D509 Iron deficiency anemia, unspecified: Secondary | ICD-10-CM | POA: Diagnosis not present

## 2016-08-03 DIAGNOSIS — N2581 Secondary hyperparathyroidism of renal origin: Secondary | ICD-10-CM | POA: Diagnosis not present

## 2016-08-03 DIAGNOSIS — D509 Iron deficiency anemia, unspecified: Secondary | ICD-10-CM | POA: Diagnosis not present

## 2016-08-03 DIAGNOSIS — N186 End stage renal disease: Secondary | ICD-10-CM | POA: Diagnosis not present

## 2016-08-03 DIAGNOSIS — K769 Liver disease, unspecified: Secondary | ICD-10-CM | POA: Diagnosis not present

## 2016-08-03 DIAGNOSIS — E440 Moderate protein-calorie malnutrition: Secondary | ICD-10-CM | POA: Diagnosis not present

## 2016-08-03 DIAGNOSIS — D631 Anemia in chronic kidney disease: Secondary | ICD-10-CM | POA: Diagnosis not present

## 2016-08-04 DIAGNOSIS — D509 Iron deficiency anemia, unspecified: Secondary | ICD-10-CM | POA: Diagnosis not present

## 2016-08-04 DIAGNOSIS — E440 Moderate protein-calorie malnutrition: Secondary | ICD-10-CM | POA: Diagnosis not present

## 2016-08-04 DIAGNOSIS — K769 Liver disease, unspecified: Secondary | ICD-10-CM | POA: Diagnosis not present

## 2016-08-04 DIAGNOSIS — D631 Anemia in chronic kidney disease: Secondary | ICD-10-CM | POA: Diagnosis not present

## 2016-08-04 DIAGNOSIS — R8299 Other abnormal findings in urine: Secondary | ICD-10-CM | POA: Diagnosis not present

## 2016-08-04 DIAGNOSIS — N2581 Secondary hyperparathyroidism of renal origin: Secondary | ICD-10-CM | POA: Diagnosis not present

## 2016-08-04 DIAGNOSIS — N186 End stage renal disease: Secondary | ICD-10-CM | POA: Diagnosis not present

## 2016-08-05 DIAGNOSIS — E440 Moderate protein-calorie malnutrition: Secondary | ICD-10-CM | POA: Diagnosis not present

## 2016-08-05 DIAGNOSIS — N186 End stage renal disease: Secondary | ICD-10-CM | POA: Diagnosis not present

## 2016-08-05 DIAGNOSIS — K769 Liver disease, unspecified: Secondary | ICD-10-CM | POA: Diagnosis not present

## 2016-08-05 DIAGNOSIS — D509 Iron deficiency anemia, unspecified: Secondary | ICD-10-CM | POA: Diagnosis not present

## 2016-08-05 DIAGNOSIS — D631 Anemia in chronic kidney disease: Secondary | ICD-10-CM | POA: Diagnosis not present

## 2016-08-05 DIAGNOSIS — N2581 Secondary hyperparathyroidism of renal origin: Secondary | ICD-10-CM | POA: Diagnosis not present

## 2016-08-06 DIAGNOSIS — N186 End stage renal disease: Secondary | ICD-10-CM | POA: Diagnosis not present

## 2016-08-06 DIAGNOSIS — D509 Iron deficiency anemia, unspecified: Secondary | ICD-10-CM | POA: Diagnosis not present

## 2016-08-06 DIAGNOSIS — E440 Moderate protein-calorie malnutrition: Secondary | ICD-10-CM | POA: Diagnosis not present

## 2016-08-06 DIAGNOSIS — D631 Anemia in chronic kidney disease: Secondary | ICD-10-CM | POA: Diagnosis not present

## 2016-08-06 DIAGNOSIS — N2581 Secondary hyperparathyroidism of renal origin: Secondary | ICD-10-CM | POA: Diagnosis not present

## 2016-08-06 DIAGNOSIS — K769 Liver disease, unspecified: Secondary | ICD-10-CM | POA: Diagnosis not present

## 2016-08-07 DIAGNOSIS — D631 Anemia in chronic kidney disease: Secondary | ICD-10-CM | POA: Diagnosis not present

## 2016-08-07 DIAGNOSIS — E440 Moderate protein-calorie malnutrition: Secondary | ICD-10-CM | POA: Diagnosis not present

## 2016-08-07 DIAGNOSIS — D509 Iron deficiency anemia, unspecified: Secondary | ICD-10-CM | POA: Diagnosis not present

## 2016-08-07 DIAGNOSIS — K769 Liver disease, unspecified: Secondary | ICD-10-CM | POA: Diagnosis not present

## 2016-08-07 DIAGNOSIS — N186 End stage renal disease: Secondary | ICD-10-CM | POA: Diagnosis not present

## 2016-08-07 DIAGNOSIS — N2581 Secondary hyperparathyroidism of renal origin: Secondary | ICD-10-CM | POA: Diagnosis not present

## 2016-08-08 DIAGNOSIS — D631 Anemia in chronic kidney disease: Secondary | ICD-10-CM | POA: Diagnosis not present

## 2016-08-08 DIAGNOSIS — N2581 Secondary hyperparathyroidism of renal origin: Secondary | ICD-10-CM | POA: Diagnosis not present

## 2016-08-08 DIAGNOSIS — N186 End stage renal disease: Secondary | ICD-10-CM | POA: Diagnosis not present

## 2016-08-08 DIAGNOSIS — E440 Moderate protein-calorie malnutrition: Secondary | ICD-10-CM | POA: Diagnosis not present

## 2016-08-08 DIAGNOSIS — D509 Iron deficiency anemia, unspecified: Secondary | ICD-10-CM | POA: Diagnosis not present

## 2016-08-08 DIAGNOSIS — K769 Liver disease, unspecified: Secondary | ICD-10-CM | POA: Diagnosis not present

## 2016-08-09 DIAGNOSIS — D509 Iron deficiency anemia, unspecified: Secondary | ICD-10-CM | POA: Diagnosis not present

## 2016-08-09 DIAGNOSIS — D631 Anemia in chronic kidney disease: Secondary | ICD-10-CM | POA: Diagnosis not present

## 2016-08-09 DIAGNOSIS — K769 Liver disease, unspecified: Secondary | ICD-10-CM | POA: Diagnosis not present

## 2016-08-09 DIAGNOSIS — N186 End stage renal disease: Secondary | ICD-10-CM | POA: Diagnosis not present

## 2016-08-09 DIAGNOSIS — N2581 Secondary hyperparathyroidism of renal origin: Secondary | ICD-10-CM | POA: Diagnosis not present

## 2016-08-09 DIAGNOSIS — E440 Moderate protein-calorie malnutrition: Secondary | ICD-10-CM | POA: Diagnosis not present

## 2016-08-10 DIAGNOSIS — N2581 Secondary hyperparathyroidism of renal origin: Secondary | ICD-10-CM | POA: Diagnosis not present

## 2016-08-10 DIAGNOSIS — E440 Moderate protein-calorie malnutrition: Secondary | ICD-10-CM | POA: Diagnosis not present

## 2016-08-10 DIAGNOSIS — N186 End stage renal disease: Secondary | ICD-10-CM | POA: Diagnosis not present

## 2016-08-10 DIAGNOSIS — D509 Iron deficiency anemia, unspecified: Secondary | ICD-10-CM | POA: Diagnosis not present

## 2016-08-10 DIAGNOSIS — K769 Liver disease, unspecified: Secondary | ICD-10-CM | POA: Diagnosis not present

## 2016-08-10 DIAGNOSIS — D631 Anemia in chronic kidney disease: Secondary | ICD-10-CM | POA: Diagnosis not present

## 2016-08-11 DIAGNOSIS — D631 Anemia in chronic kidney disease: Secondary | ICD-10-CM | POA: Diagnosis not present

## 2016-08-11 DIAGNOSIS — N2581 Secondary hyperparathyroidism of renal origin: Secondary | ICD-10-CM | POA: Diagnosis not present

## 2016-08-11 DIAGNOSIS — N186 End stage renal disease: Secondary | ICD-10-CM | POA: Diagnosis not present

## 2016-08-11 DIAGNOSIS — K769 Liver disease, unspecified: Secondary | ICD-10-CM | POA: Diagnosis not present

## 2016-08-11 DIAGNOSIS — D509 Iron deficiency anemia, unspecified: Secondary | ICD-10-CM | POA: Diagnosis not present

## 2016-08-11 DIAGNOSIS — E440 Moderate protein-calorie malnutrition: Secondary | ICD-10-CM | POA: Diagnosis not present

## 2016-08-12 DIAGNOSIS — K769 Liver disease, unspecified: Secondary | ICD-10-CM | POA: Diagnosis not present

## 2016-08-12 DIAGNOSIS — N2581 Secondary hyperparathyroidism of renal origin: Secondary | ICD-10-CM | POA: Diagnosis not present

## 2016-08-12 DIAGNOSIS — D631 Anemia in chronic kidney disease: Secondary | ICD-10-CM | POA: Diagnosis not present

## 2016-08-12 DIAGNOSIS — N186 End stage renal disease: Secondary | ICD-10-CM | POA: Diagnosis not present

## 2016-08-12 DIAGNOSIS — D509 Iron deficiency anemia, unspecified: Secondary | ICD-10-CM | POA: Diagnosis not present

## 2016-08-12 DIAGNOSIS — E440 Moderate protein-calorie malnutrition: Secondary | ICD-10-CM | POA: Diagnosis not present

## 2016-08-13 DIAGNOSIS — K769 Liver disease, unspecified: Secondary | ICD-10-CM | POA: Diagnosis not present

## 2016-08-13 DIAGNOSIS — N2581 Secondary hyperparathyroidism of renal origin: Secondary | ICD-10-CM | POA: Diagnosis not present

## 2016-08-13 DIAGNOSIS — E440 Moderate protein-calorie malnutrition: Secondary | ICD-10-CM | POA: Diagnosis not present

## 2016-08-13 DIAGNOSIS — N186 End stage renal disease: Secondary | ICD-10-CM | POA: Diagnosis not present

## 2016-08-13 DIAGNOSIS — D631 Anemia in chronic kidney disease: Secondary | ICD-10-CM | POA: Diagnosis not present

## 2016-08-13 DIAGNOSIS — D509 Iron deficiency anemia, unspecified: Secondary | ICD-10-CM | POA: Diagnosis not present

## 2016-08-14 DIAGNOSIS — N2581 Secondary hyperparathyroidism of renal origin: Secondary | ICD-10-CM | POA: Diagnosis not present

## 2016-08-14 DIAGNOSIS — E440 Moderate protein-calorie malnutrition: Secondary | ICD-10-CM | POA: Diagnosis not present

## 2016-08-14 DIAGNOSIS — N186 End stage renal disease: Secondary | ICD-10-CM | POA: Diagnosis not present

## 2016-08-14 DIAGNOSIS — D509 Iron deficiency anemia, unspecified: Secondary | ICD-10-CM | POA: Diagnosis not present

## 2016-08-14 DIAGNOSIS — D631 Anemia in chronic kidney disease: Secondary | ICD-10-CM | POA: Diagnosis not present

## 2016-08-14 DIAGNOSIS — K769 Liver disease, unspecified: Secondary | ICD-10-CM | POA: Diagnosis not present

## 2016-08-15 DIAGNOSIS — D509 Iron deficiency anemia, unspecified: Secondary | ICD-10-CM | POA: Diagnosis not present

## 2016-08-15 DIAGNOSIS — N2581 Secondary hyperparathyroidism of renal origin: Secondary | ICD-10-CM | POA: Diagnosis not present

## 2016-08-15 DIAGNOSIS — E440 Moderate protein-calorie malnutrition: Secondary | ICD-10-CM | POA: Diagnosis not present

## 2016-08-15 DIAGNOSIS — K769 Liver disease, unspecified: Secondary | ICD-10-CM | POA: Diagnosis not present

## 2016-08-15 DIAGNOSIS — N186 End stage renal disease: Secondary | ICD-10-CM | POA: Diagnosis not present

## 2016-08-15 DIAGNOSIS — D631 Anemia in chronic kidney disease: Secondary | ICD-10-CM | POA: Diagnosis not present

## 2016-08-16 DIAGNOSIS — K769 Liver disease, unspecified: Secondary | ICD-10-CM | POA: Diagnosis not present

## 2016-08-16 DIAGNOSIS — N2581 Secondary hyperparathyroidism of renal origin: Secondary | ICD-10-CM | POA: Diagnosis not present

## 2016-08-16 DIAGNOSIS — N186 End stage renal disease: Secondary | ICD-10-CM | POA: Diagnosis not present

## 2016-08-16 DIAGNOSIS — D631 Anemia in chronic kidney disease: Secondary | ICD-10-CM | POA: Diagnosis not present

## 2016-08-16 DIAGNOSIS — E440 Moderate protein-calorie malnutrition: Secondary | ICD-10-CM | POA: Diagnosis not present

## 2016-08-16 DIAGNOSIS — D509 Iron deficiency anemia, unspecified: Secondary | ICD-10-CM | POA: Diagnosis not present

## 2016-08-17 DIAGNOSIS — E440 Moderate protein-calorie malnutrition: Secondary | ICD-10-CM | POA: Diagnosis not present

## 2016-08-17 DIAGNOSIS — N2581 Secondary hyperparathyroidism of renal origin: Secondary | ICD-10-CM | POA: Diagnosis not present

## 2016-08-17 DIAGNOSIS — D631 Anemia in chronic kidney disease: Secondary | ICD-10-CM | POA: Diagnosis not present

## 2016-08-17 DIAGNOSIS — D509 Iron deficiency anemia, unspecified: Secondary | ICD-10-CM | POA: Diagnosis not present

## 2016-08-17 DIAGNOSIS — K769 Liver disease, unspecified: Secondary | ICD-10-CM | POA: Diagnosis not present

## 2016-08-17 DIAGNOSIS — N186 End stage renal disease: Secondary | ICD-10-CM | POA: Diagnosis not present

## 2016-08-18 DIAGNOSIS — K769 Liver disease, unspecified: Secondary | ICD-10-CM | POA: Diagnosis not present

## 2016-08-18 DIAGNOSIS — E440 Moderate protein-calorie malnutrition: Secondary | ICD-10-CM | POA: Diagnosis not present

## 2016-08-18 DIAGNOSIS — D509 Iron deficiency anemia, unspecified: Secondary | ICD-10-CM | POA: Diagnosis not present

## 2016-08-18 DIAGNOSIS — N2581 Secondary hyperparathyroidism of renal origin: Secondary | ICD-10-CM | POA: Diagnosis not present

## 2016-08-18 DIAGNOSIS — D631 Anemia in chronic kidney disease: Secondary | ICD-10-CM | POA: Diagnosis not present

## 2016-08-18 DIAGNOSIS — N186 End stage renal disease: Secondary | ICD-10-CM | POA: Diagnosis not present

## 2016-08-19 DIAGNOSIS — N2581 Secondary hyperparathyroidism of renal origin: Secondary | ICD-10-CM | POA: Diagnosis not present

## 2016-08-19 DIAGNOSIS — N186 End stage renal disease: Secondary | ICD-10-CM | POA: Diagnosis not present

## 2016-08-19 DIAGNOSIS — E440 Moderate protein-calorie malnutrition: Secondary | ICD-10-CM | POA: Diagnosis not present

## 2016-08-19 DIAGNOSIS — K769 Liver disease, unspecified: Secondary | ICD-10-CM | POA: Diagnosis not present

## 2016-08-19 DIAGNOSIS — D509 Iron deficiency anemia, unspecified: Secondary | ICD-10-CM | POA: Diagnosis not present

## 2016-08-19 DIAGNOSIS — D631 Anemia in chronic kidney disease: Secondary | ICD-10-CM | POA: Diagnosis not present

## 2016-08-20 DIAGNOSIS — D509 Iron deficiency anemia, unspecified: Secondary | ICD-10-CM | POA: Diagnosis not present

## 2016-08-20 DIAGNOSIS — K769 Liver disease, unspecified: Secondary | ICD-10-CM | POA: Diagnosis not present

## 2016-08-20 DIAGNOSIS — N186 End stage renal disease: Secondary | ICD-10-CM | POA: Diagnosis not present

## 2016-08-20 DIAGNOSIS — N2581 Secondary hyperparathyroidism of renal origin: Secondary | ICD-10-CM | POA: Diagnosis not present

## 2016-08-20 DIAGNOSIS — D631 Anemia in chronic kidney disease: Secondary | ICD-10-CM | POA: Diagnosis not present

## 2016-08-20 DIAGNOSIS — E440 Moderate protein-calorie malnutrition: Secondary | ICD-10-CM | POA: Diagnosis not present

## 2016-08-21 DIAGNOSIS — D509 Iron deficiency anemia, unspecified: Secondary | ICD-10-CM | POA: Diagnosis not present

## 2016-08-21 DIAGNOSIS — N186 End stage renal disease: Secondary | ICD-10-CM | POA: Diagnosis not present

## 2016-08-21 DIAGNOSIS — N2581 Secondary hyperparathyroidism of renal origin: Secondary | ICD-10-CM | POA: Diagnosis not present

## 2016-08-21 DIAGNOSIS — K769 Liver disease, unspecified: Secondary | ICD-10-CM | POA: Diagnosis not present

## 2016-08-21 DIAGNOSIS — E440 Moderate protein-calorie malnutrition: Secondary | ICD-10-CM | POA: Diagnosis not present

## 2016-08-21 DIAGNOSIS — D631 Anemia in chronic kidney disease: Secondary | ICD-10-CM | POA: Diagnosis not present

## 2016-08-22 DIAGNOSIS — E440 Moderate protein-calorie malnutrition: Secondary | ICD-10-CM | POA: Diagnosis not present

## 2016-08-22 DIAGNOSIS — D509 Iron deficiency anemia, unspecified: Secondary | ICD-10-CM | POA: Diagnosis not present

## 2016-08-22 DIAGNOSIS — N186 End stage renal disease: Secondary | ICD-10-CM | POA: Diagnosis not present

## 2016-08-22 DIAGNOSIS — N2581 Secondary hyperparathyroidism of renal origin: Secondary | ICD-10-CM | POA: Diagnosis not present

## 2016-08-22 DIAGNOSIS — D631 Anemia in chronic kidney disease: Secondary | ICD-10-CM | POA: Diagnosis not present

## 2016-08-22 DIAGNOSIS — K769 Liver disease, unspecified: Secondary | ICD-10-CM | POA: Diagnosis not present

## 2016-08-23 DIAGNOSIS — K769 Liver disease, unspecified: Secondary | ICD-10-CM | POA: Diagnosis not present

## 2016-08-23 DIAGNOSIS — E440 Moderate protein-calorie malnutrition: Secondary | ICD-10-CM | POA: Diagnosis not present

## 2016-08-23 DIAGNOSIS — N186 End stage renal disease: Secondary | ICD-10-CM | POA: Diagnosis not present

## 2016-08-23 DIAGNOSIS — D631 Anemia in chronic kidney disease: Secondary | ICD-10-CM | POA: Diagnosis not present

## 2016-08-23 DIAGNOSIS — N2581 Secondary hyperparathyroidism of renal origin: Secondary | ICD-10-CM | POA: Diagnosis not present

## 2016-08-23 DIAGNOSIS — D509 Iron deficiency anemia, unspecified: Secondary | ICD-10-CM | POA: Diagnosis not present

## 2016-08-24 DIAGNOSIS — D631 Anemia in chronic kidney disease: Secondary | ICD-10-CM | POA: Diagnosis not present

## 2016-08-24 DIAGNOSIS — N2581 Secondary hyperparathyroidism of renal origin: Secondary | ICD-10-CM | POA: Diagnosis not present

## 2016-08-24 DIAGNOSIS — E440 Moderate protein-calorie malnutrition: Secondary | ICD-10-CM | POA: Diagnosis not present

## 2016-08-24 DIAGNOSIS — N186 End stage renal disease: Secondary | ICD-10-CM | POA: Diagnosis not present

## 2016-08-24 DIAGNOSIS — D509 Iron deficiency anemia, unspecified: Secondary | ICD-10-CM | POA: Diagnosis not present

## 2016-08-24 DIAGNOSIS — K769 Liver disease, unspecified: Secondary | ICD-10-CM | POA: Diagnosis not present

## 2016-08-25 DIAGNOSIS — N186 End stage renal disease: Secondary | ICD-10-CM | POA: Diagnosis not present

## 2016-08-25 DIAGNOSIS — D509 Iron deficiency anemia, unspecified: Secondary | ICD-10-CM | POA: Diagnosis not present

## 2016-08-25 DIAGNOSIS — E440 Moderate protein-calorie malnutrition: Secondary | ICD-10-CM | POA: Diagnosis not present

## 2016-08-25 DIAGNOSIS — N2581 Secondary hyperparathyroidism of renal origin: Secondary | ICD-10-CM | POA: Diagnosis not present

## 2016-08-25 DIAGNOSIS — K769 Liver disease, unspecified: Secondary | ICD-10-CM | POA: Diagnosis not present

## 2016-08-25 DIAGNOSIS — D631 Anemia in chronic kidney disease: Secondary | ICD-10-CM | POA: Diagnosis not present

## 2016-08-26 DIAGNOSIS — K769 Liver disease, unspecified: Secondary | ICD-10-CM | POA: Diagnosis not present

## 2016-08-26 DIAGNOSIS — D509 Iron deficiency anemia, unspecified: Secondary | ICD-10-CM | POA: Diagnosis not present

## 2016-08-26 DIAGNOSIS — E440 Moderate protein-calorie malnutrition: Secondary | ICD-10-CM | POA: Diagnosis not present

## 2016-08-26 DIAGNOSIS — D631 Anemia in chronic kidney disease: Secondary | ICD-10-CM | POA: Diagnosis not present

## 2016-08-26 DIAGNOSIS — Z992 Dependence on renal dialysis: Secondary | ICD-10-CM | POA: Diagnosis not present

## 2016-08-26 DIAGNOSIS — N2581 Secondary hyperparathyroidism of renal origin: Secondary | ICD-10-CM | POA: Diagnosis not present

## 2016-08-26 DIAGNOSIS — I129 Hypertensive chronic kidney disease with stage 1 through stage 4 chronic kidney disease, or unspecified chronic kidney disease: Secondary | ICD-10-CM | POA: Diagnosis not present

## 2016-08-26 DIAGNOSIS — N186 End stage renal disease: Secondary | ICD-10-CM | POA: Diagnosis not present

## 2016-08-27 DIAGNOSIS — N2581 Secondary hyperparathyroidism of renal origin: Secondary | ICD-10-CM | POA: Diagnosis not present

## 2016-08-27 DIAGNOSIS — N2589 Other disorders resulting from impaired renal tubular function: Secondary | ICD-10-CM | POA: Diagnosis not present

## 2016-08-27 DIAGNOSIS — K769 Liver disease, unspecified: Secondary | ICD-10-CM | POA: Diagnosis not present

## 2016-08-27 DIAGNOSIS — D509 Iron deficiency anemia, unspecified: Secondary | ICD-10-CM | POA: Diagnosis not present

## 2016-08-27 DIAGNOSIS — N186 End stage renal disease: Secondary | ICD-10-CM | POA: Diagnosis not present

## 2016-08-27 DIAGNOSIS — D631 Anemia in chronic kidney disease: Secondary | ICD-10-CM | POA: Diagnosis not present

## 2016-08-28 DIAGNOSIS — D631 Anemia in chronic kidney disease: Secondary | ICD-10-CM | POA: Diagnosis not present

## 2016-08-28 DIAGNOSIS — N2589 Other disorders resulting from impaired renal tubular function: Secondary | ICD-10-CM | POA: Diagnosis not present

## 2016-08-28 DIAGNOSIS — N2581 Secondary hyperparathyroidism of renal origin: Secondary | ICD-10-CM | POA: Diagnosis not present

## 2016-08-28 DIAGNOSIS — N186 End stage renal disease: Secondary | ICD-10-CM | POA: Diagnosis not present

## 2016-08-28 DIAGNOSIS — D509 Iron deficiency anemia, unspecified: Secondary | ICD-10-CM | POA: Diagnosis not present

## 2016-08-28 DIAGNOSIS — K769 Liver disease, unspecified: Secondary | ICD-10-CM | POA: Diagnosis not present

## 2016-08-29 DIAGNOSIS — D631 Anemia in chronic kidney disease: Secondary | ICD-10-CM | POA: Diagnosis not present

## 2016-08-29 DIAGNOSIS — D509 Iron deficiency anemia, unspecified: Secondary | ICD-10-CM | POA: Diagnosis not present

## 2016-08-29 DIAGNOSIS — N2581 Secondary hyperparathyroidism of renal origin: Secondary | ICD-10-CM | POA: Diagnosis not present

## 2016-08-29 DIAGNOSIS — K769 Liver disease, unspecified: Secondary | ICD-10-CM | POA: Diagnosis not present

## 2016-08-29 DIAGNOSIS — N186 End stage renal disease: Secondary | ICD-10-CM | POA: Diagnosis not present

## 2016-08-29 DIAGNOSIS — N2589 Other disorders resulting from impaired renal tubular function: Secondary | ICD-10-CM | POA: Diagnosis not present

## 2016-08-30 DIAGNOSIS — D509 Iron deficiency anemia, unspecified: Secondary | ICD-10-CM | POA: Diagnosis not present

## 2016-08-30 DIAGNOSIS — N2589 Other disorders resulting from impaired renal tubular function: Secondary | ICD-10-CM | POA: Diagnosis not present

## 2016-08-30 DIAGNOSIS — N2581 Secondary hyperparathyroidism of renal origin: Secondary | ICD-10-CM | POA: Diagnosis not present

## 2016-08-30 DIAGNOSIS — D631 Anemia in chronic kidney disease: Secondary | ICD-10-CM | POA: Diagnosis not present

## 2016-08-30 DIAGNOSIS — N186 End stage renal disease: Secondary | ICD-10-CM | POA: Diagnosis not present

## 2016-08-30 DIAGNOSIS — K769 Liver disease, unspecified: Secondary | ICD-10-CM | POA: Diagnosis not present

## 2016-08-31 DIAGNOSIS — N2589 Other disorders resulting from impaired renal tubular function: Secondary | ICD-10-CM | POA: Diagnosis not present

## 2016-08-31 DIAGNOSIS — N2581 Secondary hyperparathyroidism of renal origin: Secondary | ICD-10-CM | POA: Diagnosis not present

## 2016-08-31 DIAGNOSIS — D509 Iron deficiency anemia, unspecified: Secondary | ICD-10-CM | POA: Diagnosis not present

## 2016-08-31 DIAGNOSIS — D631 Anemia in chronic kidney disease: Secondary | ICD-10-CM | POA: Diagnosis not present

## 2016-08-31 DIAGNOSIS — K769 Liver disease, unspecified: Secondary | ICD-10-CM | POA: Diagnosis not present

## 2016-08-31 DIAGNOSIS — N186 End stage renal disease: Secondary | ICD-10-CM | POA: Diagnosis not present

## 2016-09-01 DIAGNOSIS — K769 Liver disease, unspecified: Secondary | ICD-10-CM | POA: Diagnosis not present

## 2016-09-01 DIAGNOSIS — N2581 Secondary hyperparathyroidism of renal origin: Secondary | ICD-10-CM | POA: Diagnosis not present

## 2016-09-01 DIAGNOSIS — N2589 Other disorders resulting from impaired renal tubular function: Secondary | ICD-10-CM | POA: Diagnosis not present

## 2016-09-01 DIAGNOSIS — D509 Iron deficiency anemia, unspecified: Secondary | ICD-10-CM | POA: Diagnosis not present

## 2016-09-01 DIAGNOSIS — N186 End stage renal disease: Secondary | ICD-10-CM | POA: Diagnosis not present

## 2016-09-01 DIAGNOSIS — D631 Anemia in chronic kidney disease: Secondary | ICD-10-CM | POA: Diagnosis not present

## 2016-09-02 DIAGNOSIS — N2581 Secondary hyperparathyroidism of renal origin: Secondary | ICD-10-CM | POA: Diagnosis not present

## 2016-09-02 DIAGNOSIS — D509 Iron deficiency anemia, unspecified: Secondary | ICD-10-CM | POA: Diagnosis not present

## 2016-09-02 DIAGNOSIS — K769 Liver disease, unspecified: Secondary | ICD-10-CM | POA: Diagnosis not present

## 2016-09-02 DIAGNOSIS — D631 Anemia in chronic kidney disease: Secondary | ICD-10-CM | POA: Diagnosis not present

## 2016-09-02 DIAGNOSIS — N186 End stage renal disease: Secondary | ICD-10-CM | POA: Diagnosis not present

## 2016-09-02 DIAGNOSIS — N2589 Other disorders resulting from impaired renal tubular function: Secondary | ICD-10-CM | POA: Diagnosis not present

## 2016-09-03 DIAGNOSIS — N186 End stage renal disease: Secondary | ICD-10-CM | POA: Diagnosis not present

## 2016-09-03 DIAGNOSIS — N2581 Secondary hyperparathyroidism of renal origin: Secondary | ICD-10-CM | POA: Diagnosis not present

## 2016-09-03 DIAGNOSIS — D631 Anemia in chronic kidney disease: Secondary | ICD-10-CM | POA: Diagnosis not present

## 2016-09-03 DIAGNOSIS — K769 Liver disease, unspecified: Secondary | ICD-10-CM | POA: Diagnosis not present

## 2016-09-03 DIAGNOSIS — D509 Iron deficiency anemia, unspecified: Secondary | ICD-10-CM | POA: Diagnosis not present

## 2016-09-03 DIAGNOSIS — N2589 Other disorders resulting from impaired renal tubular function: Secondary | ICD-10-CM | POA: Diagnosis not present

## 2016-09-04 DIAGNOSIS — K769 Liver disease, unspecified: Secondary | ICD-10-CM | POA: Diagnosis not present

## 2016-09-04 DIAGNOSIS — N2581 Secondary hyperparathyroidism of renal origin: Secondary | ICD-10-CM | POA: Diagnosis not present

## 2016-09-04 DIAGNOSIS — N186 End stage renal disease: Secondary | ICD-10-CM | POA: Diagnosis not present

## 2016-09-04 DIAGNOSIS — D631 Anemia in chronic kidney disease: Secondary | ICD-10-CM | POA: Diagnosis not present

## 2016-09-04 DIAGNOSIS — N2589 Other disorders resulting from impaired renal tubular function: Secondary | ICD-10-CM | POA: Diagnosis not present

## 2016-09-04 DIAGNOSIS — D509 Iron deficiency anemia, unspecified: Secondary | ICD-10-CM | POA: Diagnosis not present

## 2016-09-05 DIAGNOSIS — E1129 Type 2 diabetes mellitus with other diabetic kidney complication: Secondary | ICD-10-CM | POA: Diagnosis not present

## 2016-09-05 DIAGNOSIS — R8299 Other abnormal findings in urine: Secondary | ICD-10-CM | POA: Diagnosis not present

## 2016-09-05 DIAGNOSIS — D509 Iron deficiency anemia, unspecified: Secondary | ICD-10-CM | POA: Diagnosis not present

## 2016-09-05 DIAGNOSIS — N186 End stage renal disease: Secondary | ICD-10-CM | POA: Diagnosis not present

## 2016-09-05 DIAGNOSIS — E784 Other hyperlipidemia: Secondary | ICD-10-CM | POA: Diagnosis not present

## 2016-09-05 DIAGNOSIS — N2581 Secondary hyperparathyroidism of renal origin: Secondary | ICD-10-CM | POA: Diagnosis not present

## 2016-09-05 DIAGNOSIS — D631 Anemia in chronic kidney disease: Secondary | ICD-10-CM | POA: Diagnosis not present

## 2016-09-05 DIAGNOSIS — N2589 Other disorders resulting from impaired renal tubular function: Secondary | ICD-10-CM | POA: Diagnosis not present

## 2016-09-05 DIAGNOSIS — K769 Liver disease, unspecified: Secondary | ICD-10-CM | POA: Diagnosis not present

## 2016-09-06 DIAGNOSIS — N2589 Other disorders resulting from impaired renal tubular function: Secondary | ICD-10-CM | POA: Diagnosis not present

## 2016-09-06 DIAGNOSIS — K769 Liver disease, unspecified: Secondary | ICD-10-CM | POA: Diagnosis not present

## 2016-09-06 DIAGNOSIS — N2581 Secondary hyperparathyroidism of renal origin: Secondary | ICD-10-CM | POA: Diagnosis not present

## 2016-09-06 DIAGNOSIS — D631 Anemia in chronic kidney disease: Secondary | ICD-10-CM | POA: Diagnosis not present

## 2016-09-06 DIAGNOSIS — N186 End stage renal disease: Secondary | ICD-10-CM | POA: Diagnosis not present

## 2016-09-06 DIAGNOSIS — D509 Iron deficiency anemia, unspecified: Secondary | ICD-10-CM | POA: Diagnosis not present

## 2016-09-07 DIAGNOSIS — N186 End stage renal disease: Secondary | ICD-10-CM | POA: Diagnosis not present

## 2016-09-07 DIAGNOSIS — N2589 Other disorders resulting from impaired renal tubular function: Secondary | ICD-10-CM | POA: Diagnosis not present

## 2016-09-07 DIAGNOSIS — N2581 Secondary hyperparathyroidism of renal origin: Secondary | ICD-10-CM | POA: Diagnosis not present

## 2016-09-07 DIAGNOSIS — K769 Liver disease, unspecified: Secondary | ICD-10-CM | POA: Diagnosis not present

## 2016-09-07 DIAGNOSIS — D509 Iron deficiency anemia, unspecified: Secondary | ICD-10-CM | POA: Diagnosis not present

## 2016-09-07 DIAGNOSIS — D631 Anemia in chronic kidney disease: Secondary | ICD-10-CM | POA: Diagnosis not present

## 2016-09-07 DIAGNOSIS — J069 Acute upper respiratory infection, unspecified: Secondary | ICD-10-CM | POA: Diagnosis not present

## 2016-09-08 DIAGNOSIS — K769 Liver disease, unspecified: Secondary | ICD-10-CM | POA: Diagnosis not present

## 2016-09-08 DIAGNOSIS — D509 Iron deficiency anemia, unspecified: Secondary | ICD-10-CM | POA: Diagnosis not present

## 2016-09-08 DIAGNOSIS — N2589 Other disorders resulting from impaired renal tubular function: Secondary | ICD-10-CM | POA: Diagnosis not present

## 2016-09-08 DIAGNOSIS — D631 Anemia in chronic kidney disease: Secondary | ICD-10-CM | POA: Diagnosis not present

## 2016-09-08 DIAGNOSIS — N2581 Secondary hyperparathyroidism of renal origin: Secondary | ICD-10-CM | POA: Diagnosis not present

## 2016-09-08 DIAGNOSIS — N186 End stage renal disease: Secondary | ICD-10-CM | POA: Diagnosis not present

## 2016-09-09 DIAGNOSIS — N2589 Other disorders resulting from impaired renal tubular function: Secondary | ICD-10-CM | POA: Diagnosis not present

## 2016-09-09 DIAGNOSIS — D509 Iron deficiency anemia, unspecified: Secondary | ICD-10-CM | POA: Diagnosis not present

## 2016-09-09 DIAGNOSIS — N186 End stage renal disease: Secondary | ICD-10-CM | POA: Diagnosis not present

## 2016-09-09 DIAGNOSIS — K769 Liver disease, unspecified: Secondary | ICD-10-CM | POA: Diagnosis not present

## 2016-09-09 DIAGNOSIS — N2581 Secondary hyperparathyroidism of renal origin: Secondary | ICD-10-CM | POA: Diagnosis not present

## 2016-09-09 DIAGNOSIS — D631 Anemia in chronic kidney disease: Secondary | ICD-10-CM | POA: Diagnosis not present

## 2016-09-10 DIAGNOSIS — N2581 Secondary hyperparathyroidism of renal origin: Secondary | ICD-10-CM | POA: Diagnosis not present

## 2016-09-10 DIAGNOSIS — K769 Liver disease, unspecified: Secondary | ICD-10-CM | POA: Diagnosis not present

## 2016-09-10 DIAGNOSIS — D509 Iron deficiency anemia, unspecified: Secondary | ICD-10-CM | POA: Diagnosis not present

## 2016-09-10 DIAGNOSIS — D631 Anemia in chronic kidney disease: Secondary | ICD-10-CM | POA: Diagnosis not present

## 2016-09-10 DIAGNOSIS — N2589 Other disorders resulting from impaired renal tubular function: Secondary | ICD-10-CM | POA: Diagnosis not present

## 2016-09-10 DIAGNOSIS — N186 End stage renal disease: Secondary | ICD-10-CM | POA: Diagnosis not present

## 2016-09-11 DIAGNOSIS — D631 Anemia in chronic kidney disease: Secondary | ICD-10-CM | POA: Diagnosis not present

## 2016-09-11 DIAGNOSIS — N2581 Secondary hyperparathyroidism of renal origin: Secondary | ICD-10-CM | POA: Diagnosis not present

## 2016-09-11 DIAGNOSIS — N2589 Other disorders resulting from impaired renal tubular function: Secondary | ICD-10-CM | POA: Diagnosis not present

## 2016-09-11 DIAGNOSIS — N186 End stage renal disease: Secondary | ICD-10-CM | POA: Diagnosis not present

## 2016-09-11 DIAGNOSIS — K769 Liver disease, unspecified: Secondary | ICD-10-CM | POA: Diagnosis not present

## 2016-09-11 DIAGNOSIS — D509 Iron deficiency anemia, unspecified: Secondary | ICD-10-CM | POA: Diagnosis not present

## 2016-09-12 DIAGNOSIS — D631 Anemia in chronic kidney disease: Secondary | ICD-10-CM | POA: Diagnosis not present

## 2016-09-12 DIAGNOSIS — K769 Liver disease, unspecified: Secondary | ICD-10-CM | POA: Diagnosis not present

## 2016-09-12 DIAGNOSIS — N2581 Secondary hyperparathyroidism of renal origin: Secondary | ICD-10-CM | POA: Diagnosis not present

## 2016-09-12 DIAGNOSIS — N186 End stage renal disease: Secondary | ICD-10-CM | POA: Diagnosis not present

## 2016-09-12 DIAGNOSIS — D509 Iron deficiency anemia, unspecified: Secondary | ICD-10-CM | POA: Diagnosis not present

## 2016-09-12 DIAGNOSIS — N2589 Other disorders resulting from impaired renal tubular function: Secondary | ICD-10-CM | POA: Diagnosis not present

## 2016-09-13 DIAGNOSIS — N2589 Other disorders resulting from impaired renal tubular function: Secondary | ICD-10-CM | POA: Diagnosis not present

## 2016-09-13 DIAGNOSIS — K769 Liver disease, unspecified: Secondary | ICD-10-CM | POA: Diagnosis not present

## 2016-09-13 DIAGNOSIS — D509 Iron deficiency anemia, unspecified: Secondary | ICD-10-CM | POA: Diagnosis not present

## 2016-09-13 DIAGNOSIS — N186 End stage renal disease: Secondary | ICD-10-CM | POA: Diagnosis not present

## 2016-09-13 DIAGNOSIS — D631 Anemia in chronic kidney disease: Secondary | ICD-10-CM | POA: Diagnosis not present

## 2016-09-13 DIAGNOSIS — N2581 Secondary hyperparathyroidism of renal origin: Secondary | ICD-10-CM | POA: Diagnosis not present

## 2016-09-14 DIAGNOSIS — N2581 Secondary hyperparathyroidism of renal origin: Secondary | ICD-10-CM | POA: Diagnosis not present

## 2016-09-14 DIAGNOSIS — N186 End stage renal disease: Secondary | ICD-10-CM | POA: Diagnosis not present

## 2016-09-14 DIAGNOSIS — K769 Liver disease, unspecified: Secondary | ICD-10-CM | POA: Diagnosis not present

## 2016-09-14 DIAGNOSIS — D631 Anemia in chronic kidney disease: Secondary | ICD-10-CM | POA: Diagnosis not present

## 2016-09-14 DIAGNOSIS — N2589 Other disorders resulting from impaired renal tubular function: Secondary | ICD-10-CM | POA: Diagnosis not present

## 2016-09-14 DIAGNOSIS — D509 Iron deficiency anemia, unspecified: Secondary | ICD-10-CM | POA: Diagnosis not present

## 2016-09-15 DIAGNOSIS — N2581 Secondary hyperparathyroidism of renal origin: Secondary | ICD-10-CM | POA: Diagnosis not present

## 2016-09-15 DIAGNOSIS — N2589 Other disorders resulting from impaired renal tubular function: Secondary | ICD-10-CM | POA: Diagnosis not present

## 2016-09-15 DIAGNOSIS — D509 Iron deficiency anemia, unspecified: Secondary | ICD-10-CM | POA: Diagnosis not present

## 2016-09-15 DIAGNOSIS — K769 Liver disease, unspecified: Secondary | ICD-10-CM | POA: Diagnosis not present

## 2016-09-15 DIAGNOSIS — D631 Anemia in chronic kidney disease: Secondary | ICD-10-CM | POA: Diagnosis not present

## 2016-09-15 DIAGNOSIS — N186 End stage renal disease: Secondary | ICD-10-CM | POA: Diagnosis not present

## 2016-09-16 DIAGNOSIS — D509 Iron deficiency anemia, unspecified: Secondary | ICD-10-CM | POA: Diagnosis not present

## 2016-09-16 DIAGNOSIS — N2589 Other disorders resulting from impaired renal tubular function: Secondary | ICD-10-CM | POA: Diagnosis not present

## 2016-09-16 DIAGNOSIS — N2581 Secondary hyperparathyroidism of renal origin: Secondary | ICD-10-CM | POA: Diagnosis not present

## 2016-09-16 DIAGNOSIS — N186 End stage renal disease: Secondary | ICD-10-CM | POA: Diagnosis not present

## 2016-09-16 DIAGNOSIS — K769 Liver disease, unspecified: Secondary | ICD-10-CM | POA: Diagnosis not present

## 2016-09-16 DIAGNOSIS — D631 Anemia in chronic kidney disease: Secondary | ICD-10-CM | POA: Diagnosis not present

## 2016-09-17 DIAGNOSIS — K769 Liver disease, unspecified: Secondary | ICD-10-CM | POA: Diagnosis not present

## 2016-09-17 DIAGNOSIS — D509 Iron deficiency anemia, unspecified: Secondary | ICD-10-CM | POA: Diagnosis not present

## 2016-09-17 DIAGNOSIS — N2581 Secondary hyperparathyroidism of renal origin: Secondary | ICD-10-CM | POA: Diagnosis not present

## 2016-09-17 DIAGNOSIS — D631 Anemia in chronic kidney disease: Secondary | ICD-10-CM | POA: Diagnosis not present

## 2016-09-17 DIAGNOSIS — N2589 Other disorders resulting from impaired renal tubular function: Secondary | ICD-10-CM | POA: Diagnosis not present

## 2016-09-17 DIAGNOSIS — N186 End stage renal disease: Secondary | ICD-10-CM | POA: Diagnosis not present

## 2016-09-18 DIAGNOSIS — N2589 Other disorders resulting from impaired renal tubular function: Secondary | ICD-10-CM | POA: Diagnosis not present

## 2016-09-18 DIAGNOSIS — N2581 Secondary hyperparathyroidism of renal origin: Secondary | ICD-10-CM | POA: Diagnosis not present

## 2016-09-18 DIAGNOSIS — N186 End stage renal disease: Secondary | ICD-10-CM | POA: Diagnosis not present

## 2016-09-18 DIAGNOSIS — K769 Liver disease, unspecified: Secondary | ICD-10-CM | POA: Diagnosis not present

## 2016-09-18 DIAGNOSIS — D509 Iron deficiency anemia, unspecified: Secondary | ICD-10-CM | POA: Diagnosis not present

## 2016-09-18 DIAGNOSIS — D631 Anemia in chronic kidney disease: Secondary | ICD-10-CM | POA: Diagnosis not present

## 2016-09-19 DIAGNOSIS — N2589 Other disorders resulting from impaired renal tubular function: Secondary | ICD-10-CM | POA: Diagnosis not present

## 2016-09-19 DIAGNOSIS — D631 Anemia in chronic kidney disease: Secondary | ICD-10-CM | POA: Diagnosis not present

## 2016-09-19 DIAGNOSIS — N186 End stage renal disease: Secondary | ICD-10-CM | POA: Diagnosis not present

## 2016-09-19 DIAGNOSIS — K769 Liver disease, unspecified: Secondary | ICD-10-CM | POA: Diagnosis not present

## 2016-09-19 DIAGNOSIS — N2581 Secondary hyperparathyroidism of renal origin: Secondary | ICD-10-CM | POA: Diagnosis not present

## 2016-09-19 DIAGNOSIS — D509 Iron deficiency anemia, unspecified: Secondary | ICD-10-CM | POA: Diagnosis not present

## 2016-09-20 DIAGNOSIS — K769 Liver disease, unspecified: Secondary | ICD-10-CM | POA: Diagnosis not present

## 2016-09-20 DIAGNOSIS — D509 Iron deficiency anemia, unspecified: Secondary | ICD-10-CM | POA: Diagnosis not present

## 2016-09-20 DIAGNOSIS — N2581 Secondary hyperparathyroidism of renal origin: Secondary | ICD-10-CM | POA: Diagnosis not present

## 2016-09-20 DIAGNOSIS — D631 Anemia in chronic kidney disease: Secondary | ICD-10-CM | POA: Diagnosis not present

## 2016-09-20 DIAGNOSIS — N2589 Other disorders resulting from impaired renal tubular function: Secondary | ICD-10-CM | POA: Diagnosis not present

## 2016-09-20 DIAGNOSIS — N186 End stage renal disease: Secondary | ICD-10-CM | POA: Diagnosis not present

## 2016-09-21 DIAGNOSIS — K769 Liver disease, unspecified: Secondary | ICD-10-CM | POA: Diagnosis not present

## 2016-09-21 DIAGNOSIS — N2589 Other disorders resulting from impaired renal tubular function: Secondary | ICD-10-CM | POA: Diagnosis not present

## 2016-09-21 DIAGNOSIS — N2581 Secondary hyperparathyroidism of renal origin: Secondary | ICD-10-CM | POA: Diagnosis not present

## 2016-09-21 DIAGNOSIS — D631 Anemia in chronic kidney disease: Secondary | ICD-10-CM | POA: Diagnosis not present

## 2016-09-21 DIAGNOSIS — N186 End stage renal disease: Secondary | ICD-10-CM | POA: Diagnosis not present

## 2016-09-21 DIAGNOSIS — D509 Iron deficiency anemia, unspecified: Secondary | ICD-10-CM | POA: Diagnosis not present

## 2016-09-22 DIAGNOSIS — N2589 Other disorders resulting from impaired renal tubular function: Secondary | ICD-10-CM | POA: Diagnosis not present

## 2016-09-22 DIAGNOSIS — K769 Liver disease, unspecified: Secondary | ICD-10-CM | POA: Diagnosis not present

## 2016-09-22 DIAGNOSIS — D631 Anemia in chronic kidney disease: Secondary | ICD-10-CM | POA: Diagnosis not present

## 2016-09-22 DIAGNOSIS — N186 End stage renal disease: Secondary | ICD-10-CM | POA: Diagnosis not present

## 2016-09-22 DIAGNOSIS — N2581 Secondary hyperparathyroidism of renal origin: Secondary | ICD-10-CM | POA: Diagnosis not present

## 2016-09-22 DIAGNOSIS — D509 Iron deficiency anemia, unspecified: Secondary | ICD-10-CM | POA: Diagnosis not present

## 2016-09-23 DIAGNOSIS — D631 Anemia in chronic kidney disease: Secondary | ICD-10-CM | POA: Diagnosis not present

## 2016-09-23 DIAGNOSIS — N186 End stage renal disease: Secondary | ICD-10-CM | POA: Diagnosis not present

## 2016-09-23 DIAGNOSIS — K769 Liver disease, unspecified: Secondary | ICD-10-CM | POA: Diagnosis not present

## 2016-09-23 DIAGNOSIS — D509 Iron deficiency anemia, unspecified: Secondary | ICD-10-CM | POA: Diagnosis not present

## 2016-09-23 DIAGNOSIS — N2589 Other disorders resulting from impaired renal tubular function: Secondary | ICD-10-CM | POA: Diagnosis not present

## 2016-09-23 DIAGNOSIS — N2581 Secondary hyperparathyroidism of renal origin: Secondary | ICD-10-CM | POA: Diagnosis not present

## 2016-09-24 DIAGNOSIS — D509 Iron deficiency anemia, unspecified: Secondary | ICD-10-CM | POA: Diagnosis not present

## 2016-09-24 DIAGNOSIS — D631 Anemia in chronic kidney disease: Secondary | ICD-10-CM | POA: Diagnosis not present

## 2016-09-24 DIAGNOSIS — N186 End stage renal disease: Secondary | ICD-10-CM | POA: Diagnosis not present

## 2016-09-24 DIAGNOSIS — N2581 Secondary hyperparathyroidism of renal origin: Secondary | ICD-10-CM | POA: Diagnosis not present

## 2016-09-24 DIAGNOSIS — N2589 Other disorders resulting from impaired renal tubular function: Secondary | ICD-10-CM | POA: Diagnosis not present

## 2016-09-24 DIAGNOSIS — K769 Liver disease, unspecified: Secondary | ICD-10-CM | POA: Diagnosis not present

## 2016-09-25 DIAGNOSIS — D631 Anemia in chronic kidney disease: Secondary | ICD-10-CM | POA: Diagnosis not present

## 2016-09-25 DIAGNOSIS — K769 Liver disease, unspecified: Secondary | ICD-10-CM | POA: Diagnosis not present

## 2016-09-25 DIAGNOSIS — N2589 Other disorders resulting from impaired renal tubular function: Secondary | ICD-10-CM | POA: Diagnosis not present

## 2016-09-25 DIAGNOSIS — N186 End stage renal disease: Secondary | ICD-10-CM | POA: Diagnosis not present

## 2016-09-25 DIAGNOSIS — N2581 Secondary hyperparathyroidism of renal origin: Secondary | ICD-10-CM | POA: Diagnosis not present

## 2016-09-25 DIAGNOSIS — D509 Iron deficiency anemia, unspecified: Secondary | ICD-10-CM | POA: Diagnosis not present

## 2016-09-26 DIAGNOSIS — N186 End stage renal disease: Secondary | ICD-10-CM | POA: Diagnosis not present

## 2016-09-26 DIAGNOSIS — N2581 Secondary hyperparathyroidism of renal origin: Secondary | ICD-10-CM | POA: Diagnosis not present

## 2016-09-26 DIAGNOSIS — N2589 Other disorders resulting from impaired renal tubular function: Secondary | ICD-10-CM | POA: Diagnosis not present

## 2016-09-26 DIAGNOSIS — N182 Chronic kidney disease, stage 2 (mild): Secondary | ICD-10-CM | POA: Diagnosis not present

## 2016-09-26 DIAGNOSIS — K769 Liver disease, unspecified: Secondary | ICD-10-CM | POA: Diagnosis not present

## 2016-09-26 DIAGNOSIS — D631 Anemia in chronic kidney disease: Secondary | ICD-10-CM | POA: Diagnosis not present

## 2016-09-26 DIAGNOSIS — Z992 Dependence on renal dialysis: Secondary | ICD-10-CM | POA: Diagnosis not present

## 2016-09-26 DIAGNOSIS — T82858D Stenosis of vascular prosthetic devices, implants and grafts, subsequent encounter: Secondary | ICD-10-CM | POA: Diagnosis not present

## 2016-09-26 DIAGNOSIS — I871 Compression of vein: Secondary | ICD-10-CM | POA: Diagnosis not present

## 2016-09-26 DIAGNOSIS — D509 Iron deficiency anemia, unspecified: Secondary | ICD-10-CM | POA: Diagnosis not present

## 2016-09-26 DIAGNOSIS — I129 Hypertensive chronic kidney disease with stage 1 through stage 4 chronic kidney disease, or unspecified chronic kidney disease: Secondary | ICD-10-CM | POA: Diagnosis not present

## 2016-09-27 DIAGNOSIS — K769 Liver disease, unspecified: Secondary | ICD-10-CM | POA: Diagnosis not present

## 2016-09-27 DIAGNOSIS — D509 Iron deficiency anemia, unspecified: Secondary | ICD-10-CM | POA: Diagnosis not present

## 2016-09-27 DIAGNOSIS — Z5189 Encounter for other specified aftercare: Secondary | ICD-10-CM | POA: Diagnosis not present

## 2016-09-27 DIAGNOSIS — N186 End stage renal disease: Secondary | ICD-10-CM | POA: Diagnosis not present

## 2016-09-27 DIAGNOSIS — D631 Anemia in chronic kidney disease: Secondary | ICD-10-CM | POA: Diagnosis not present

## 2016-09-27 DIAGNOSIS — N2589 Other disorders resulting from impaired renal tubular function: Secondary | ICD-10-CM | POA: Diagnosis not present

## 2016-09-27 DIAGNOSIS — E440 Moderate protein-calorie malnutrition: Secondary | ICD-10-CM | POA: Diagnosis not present

## 2016-09-27 DIAGNOSIS — Z79899 Other long term (current) drug therapy: Secondary | ICD-10-CM | POA: Diagnosis not present

## 2016-09-28 DIAGNOSIS — D509 Iron deficiency anemia, unspecified: Secondary | ICD-10-CM | POA: Diagnosis not present

## 2016-09-28 DIAGNOSIS — Z5189 Encounter for other specified aftercare: Secondary | ICD-10-CM | POA: Diagnosis not present

## 2016-09-28 DIAGNOSIS — Z79899 Other long term (current) drug therapy: Secondary | ICD-10-CM | POA: Diagnosis not present

## 2016-09-28 DIAGNOSIS — N186 End stage renal disease: Secondary | ICD-10-CM | POA: Diagnosis not present

## 2016-09-28 DIAGNOSIS — D631 Anemia in chronic kidney disease: Secondary | ICD-10-CM | POA: Diagnosis not present

## 2016-09-28 DIAGNOSIS — E440 Moderate protein-calorie malnutrition: Secondary | ICD-10-CM | POA: Diagnosis not present

## 2016-09-29 DIAGNOSIS — N186 End stage renal disease: Secondary | ICD-10-CM | POA: Diagnosis not present

## 2016-09-29 DIAGNOSIS — E440 Moderate protein-calorie malnutrition: Secondary | ICD-10-CM | POA: Diagnosis not present

## 2016-09-29 DIAGNOSIS — D631 Anemia in chronic kidney disease: Secondary | ICD-10-CM | POA: Diagnosis not present

## 2016-09-29 DIAGNOSIS — D509 Iron deficiency anemia, unspecified: Secondary | ICD-10-CM | POA: Diagnosis not present

## 2016-09-29 DIAGNOSIS — Z5189 Encounter for other specified aftercare: Secondary | ICD-10-CM | POA: Diagnosis not present

## 2016-09-29 DIAGNOSIS — Z79899 Other long term (current) drug therapy: Secondary | ICD-10-CM | POA: Diagnosis not present

## 2016-09-30 DIAGNOSIS — Z79899 Other long term (current) drug therapy: Secondary | ICD-10-CM | POA: Diagnosis not present

## 2016-09-30 DIAGNOSIS — Z5189 Encounter for other specified aftercare: Secondary | ICD-10-CM | POA: Diagnosis not present

## 2016-09-30 DIAGNOSIS — E440 Moderate protein-calorie malnutrition: Secondary | ICD-10-CM | POA: Diagnosis not present

## 2016-09-30 DIAGNOSIS — D509 Iron deficiency anemia, unspecified: Secondary | ICD-10-CM | POA: Diagnosis not present

## 2016-09-30 DIAGNOSIS — N186 End stage renal disease: Secondary | ICD-10-CM | POA: Diagnosis not present

## 2016-09-30 DIAGNOSIS — D631 Anemia in chronic kidney disease: Secondary | ICD-10-CM | POA: Diagnosis not present

## 2016-10-01 DIAGNOSIS — Z79899 Other long term (current) drug therapy: Secondary | ICD-10-CM | POA: Diagnosis not present

## 2016-10-01 DIAGNOSIS — Z5189 Encounter for other specified aftercare: Secondary | ICD-10-CM | POA: Diagnosis not present

## 2016-10-01 DIAGNOSIS — D509 Iron deficiency anemia, unspecified: Secondary | ICD-10-CM | POA: Diagnosis not present

## 2016-10-01 DIAGNOSIS — E440 Moderate protein-calorie malnutrition: Secondary | ICD-10-CM | POA: Diagnosis not present

## 2016-10-01 DIAGNOSIS — N186 End stage renal disease: Secondary | ICD-10-CM | POA: Diagnosis not present

## 2016-10-01 DIAGNOSIS — D631 Anemia in chronic kidney disease: Secondary | ICD-10-CM | POA: Diagnosis not present

## 2016-10-02 DIAGNOSIS — D631 Anemia in chronic kidney disease: Secondary | ICD-10-CM | POA: Diagnosis not present

## 2016-10-02 DIAGNOSIS — Z5189 Encounter for other specified aftercare: Secondary | ICD-10-CM | POA: Diagnosis not present

## 2016-10-02 DIAGNOSIS — R8299 Other abnormal findings in urine: Secondary | ICD-10-CM | POA: Diagnosis not present

## 2016-10-02 DIAGNOSIS — Z79899 Other long term (current) drug therapy: Secondary | ICD-10-CM | POA: Diagnosis not present

## 2016-10-02 DIAGNOSIS — D509 Iron deficiency anemia, unspecified: Secondary | ICD-10-CM | POA: Diagnosis not present

## 2016-10-02 DIAGNOSIS — N186 End stage renal disease: Secondary | ICD-10-CM | POA: Diagnosis not present

## 2016-10-02 DIAGNOSIS — E440 Moderate protein-calorie malnutrition: Secondary | ICD-10-CM | POA: Diagnosis not present

## 2016-10-03 DIAGNOSIS — N186 End stage renal disease: Secondary | ICD-10-CM | POA: Diagnosis not present

## 2016-10-03 DIAGNOSIS — Z79899 Other long term (current) drug therapy: Secondary | ICD-10-CM | POA: Diagnosis not present

## 2016-10-03 DIAGNOSIS — Z5189 Encounter for other specified aftercare: Secondary | ICD-10-CM | POA: Diagnosis not present

## 2016-10-03 DIAGNOSIS — D631 Anemia in chronic kidney disease: Secondary | ICD-10-CM | POA: Diagnosis not present

## 2016-10-03 DIAGNOSIS — D509 Iron deficiency anemia, unspecified: Secondary | ICD-10-CM | POA: Diagnosis not present

## 2016-10-03 DIAGNOSIS — E440 Moderate protein-calorie malnutrition: Secondary | ICD-10-CM | POA: Diagnosis not present

## 2016-10-04 DIAGNOSIS — Z5189 Encounter for other specified aftercare: Secondary | ICD-10-CM | POA: Diagnosis not present

## 2016-10-04 DIAGNOSIS — E440 Moderate protein-calorie malnutrition: Secondary | ICD-10-CM | POA: Diagnosis not present

## 2016-10-04 DIAGNOSIS — D631 Anemia in chronic kidney disease: Secondary | ICD-10-CM | POA: Diagnosis not present

## 2016-10-04 DIAGNOSIS — N186 End stage renal disease: Secondary | ICD-10-CM | POA: Diagnosis not present

## 2016-10-04 DIAGNOSIS — D509 Iron deficiency anemia, unspecified: Secondary | ICD-10-CM | POA: Diagnosis not present

## 2016-10-04 DIAGNOSIS — Z79899 Other long term (current) drug therapy: Secondary | ICD-10-CM | POA: Diagnosis not present

## 2016-10-05 DIAGNOSIS — E440 Moderate protein-calorie malnutrition: Secondary | ICD-10-CM | POA: Diagnosis not present

## 2016-10-05 DIAGNOSIS — N186 End stage renal disease: Secondary | ICD-10-CM | POA: Diagnosis not present

## 2016-10-05 DIAGNOSIS — D509 Iron deficiency anemia, unspecified: Secondary | ICD-10-CM | POA: Diagnosis not present

## 2016-10-05 DIAGNOSIS — Z79899 Other long term (current) drug therapy: Secondary | ICD-10-CM | POA: Diagnosis not present

## 2016-10-05 DIAGNOSIS — Z5189 Encounter for other specified aftercare: Secondary | ICD-10-CM | POA: Diagnosis not present

## 2016-10-05 DIAGNOSIS — D631 Anemia in chronic kidney disease: Secondary | ICD-10-CM | POA: Diagnosis not present

## 2016-10-06 DIAGNOSIS — N186 End stage renal disease: Secondary | ICD-10-CM | POA: Diagnosis not present

## 2016-10-06 DIAGNOSIS — D631 Anemia in chronic kidney disease: Secondary | ICD-10-CM | POA: Diagnosis not present

## 2016-10-06 DIAGNOSIS — Z79899 Other long term (current) drug therapy: Secondary | ICD-10-CM | POA: Diagnosis not present

## 2016-10-06 DIAGNOSIS — Z5189 Encounter for other specified aftercare: Secondary | ICD-10-CM | POA: Diagnosis not present

## 2016-10-06 DIAGNOSIS — D509 Iron deficiency anemia, unspecified: Secondary | ICD-10-CM | POA: Diagnosis not present

## 2016-10-06 DIAGNOSIS — E440 Moderate protein-calorie malnutrition: Secondary | ICD-10-CM | POA: Diagnosis not present

## 2016-10-07 DIAGNOSIS — N186 End stage renal disease: Secondary | ICD-10-CM | POA: Diagnosis not present

## 2016-10-07 DIAGNOSIS — E440 Moderate protein-calorie malnutrition: Secondary | ICD-10-CM | POA: Diagnosis not present

## 2016-10-07 DIAGNOSIS — D509 Iron deficiency anemia, unspecified: Secondary | ICD-10-CM | POA: Diagnosis not present

## 2016-10-07 DIAGNOSIS — Z79899 Other long term (current) drug therapy: Secondary | ICD-10-CM | POA: Diagnosis not present

## 2016-10-07 DIAGNOSIS — Z5189 Encounter for other specified aftercare: Secondary | ICD-10-CM | POA: Diagnosis not present

## 2016-10-07 DIAGNOSIS — D631 Anemia in chronic kidney disease: Secondary | ICD-10-CM | POA: Diagnosis not present

## 2016-10-08 DIAGNOSIS — Z5189 Encounter for other specified aftercare: Secondary | ICD-10-CM | POA: Diagnosis not present

## 2016-10-08 DIAGNOSIS — N186 End stage renal disease: Secondary | ICD-10-CM | POA: Diagnosis not present

## 2016-10-08 DIAGNOSIS — E440 Moderate protein-calorie malnutrition: Secondary | ICD-10-CM | POA: Diagnosis not present

## 2016-10-08 DIAGNOSIS — D509 Iron deficiency anemia, unspecified: Secondary | ICD-10-CM | POA: Diagnosis not present

## 2016-10-08 DIAGNOSIS — Z79899 Other long term (current) drug therapy: Secondary | ICD-10-CM | POA: Diagnosis not present

## 2016-10-08 DIAGNOSIS — D631 Anemia in chronic kidney disease: Secondary | ICD-10-CM | POA: Diagnosis not present

## 2016-10-09 DIAGNOSIS — Z79899 Other long term (current) drug therapy: Secondary | ICD-10-CM | POA: Diagnosis not present

## 2016-10-09 DIAGNOSIS — N186 End stage renal disease: Secondary | ICD-10-CM | POA: Diagnosis not present

## 2016-10-09 DIAGNOSIS — E440 Moderate protein-calorie malnutrition: Secondary | ICD-10-CM | POA: Diagnosis not present

## 2016-10-09 DIAGNOSIS — Z5189 Encounter for other specified aftercare: Secondary | ICD-10-CM | POA: Diagnosis not present

## 2016-10-09 DIAGNOSIS — D509 Iron deficiency anemia, unspecified: Secondary | ICD-10-CM | POA: Diagnosis not present

## 2016-10-09 DIAGNOSIS — D631 Anemia in chronic kidney disease: Secondary | ICD-10-CM | POA: Diagnosis not present

## 2016-10-10 DIAGNOSIS — Z79899 Other long term (current) drug therapy: Secondary | ICD-10-CM | POA: Diagnosis not present

## 2016-10-10 DIAGNOSIS — D509 Iron deficiency anemia, unspecified: Secondary | ICD-10-CM | POA: Diagnosis not present

## 2016-10-10 DIAGNOSIS — E440 Moderate protein-calorie malnutrition: Secondary | ICD-10-CM | POA: Diagnosis not present

## 2016-10-10 DIAGNOSIS — D631 Anemia in chronic kidney disease: Secondary | ICD-10-CM | POA: Diagnosis not present

## 2016-10-10 DIAGNOSIS — N186 End stage renal disease: Secondary | ICD-10-CM | POA: Diagnosis not present

## 2016-10-10 DIAGNOSIS — Z5189 Encounter for other specified aftercare: Secondary | ICD-10-CM | POA: Diagnosis not present

## 2016-10-11 DIAGNOSIS — D631 Anemia in chronic kidney disease: Secondary | ICD-10-CM | POA: Diagnosis not present

## 2016-10-11 DIAGNOSIS — Z5189 Encounter for other specified aftercare: Secondary | ICD-10-CM | POA: Diagnosis not present

## 2016-10-11 DIAGNOSIS — D509 Iron deficiency anemia, unspecified: Secondary | ICD-10-CM | POA: Diagnosis not present

## 2016-10-11 DIAGNOSIS — N186 End stage renal disease: Secondary | ICD-10-CM | POA: Diagnosis not present

## 2016-10-11 DIAGNOSIS — Z79899 Other long term (current) drug therapy: Secondary | ICD-10-CM | POA: Diagnosis not present

## 2016-10-11 DIAGNOSIS — E440 Moderate protein-calorie malnutrition: Secondary | ICD-10-CM | POA: Diagnosis not present

## 2016-10-12 DIAGNOSIS — D509 Iron deficiency anemia, unspecified: Secondary | ICD-10-CM | POA: Diagnosis not present

## 2016-10-12 DIAGNOSIS — Z79899 Other long term (current) drug therapy: Secondary | ICD-10-CM | POA: Diagnosis not present

## 2016-10-12 DIAGNOSIS — N186 End stage renal disease: Secondary | ICD-10-CM | POA: Diagnosis not present

## 2016-10-12 DIAGNOSIS — E440 Moderate protein-calorie malnutrition: Secondary | ICD-10-CM | POA: Diagnosis not present

## 2016-10-12 DIAGNOSIS — D631 Anemia in chronic kidney disease: Secondary | ICD-10-CM | POA: Diagnosis not present

## 2016-10-12 DIAGNOSIS — Z5189 Encounter for other specified aftercare: Secondary | ICD-10-CM | POA: Diagnosis not present

## 2016-10-13 DIAGNOSIS — D631 Anemia in chronic kidney disease: Secondary | ICD-10-CM | POA: Diagnosis not present

## 2016-10-13 DIAGNOSIS — N186 End stage renal disease: Secondary | ICD-10-CM | POA: Diagnosis not present

## 2016-10-13 DIAGNOSIS — D509 Iron deficiency anemia, unspecified: Secondary | ICD-10-CM | POA: Diagnosis not present

## 2016-10-13 DIAGNOSIS — Z5189 Encounter for other specified aftercare: Secondary | ICD-10-CM | POA: Diagnosis not present

## 2016-10-13 DIAGNOSIS — E440 Moderate protein-calorie malnutrition: Secondary | ICD-10-CM | POA: Diagnosis not present

## 2016-10-13 DIAGNOSIS — Z79899 Other long term (current) drug therapy: Secondary | ICD-10-CM | POA: Diagnosis not present

## 2016-10-14 DIAGNOSIS — D631 Anemia in chronic kidney disease: Secondary | ICD-10-CM | POA: Diagnosis not present

## 2016-10-14 DIAGNOSIS — Z79899 Other long term (current) drug therapy: Secondary | ICD-10-CM | POA: Diagnosis not present

## 2016-10-14 DIAGNOSIS — N186 End stage renal disease: Secondary | ICD-10-CM | POA: Diagnosis not present

## 2016-10-14 DIAGNOSIS — D509 Iron deficiency anemia, unspecified: Secondary | ICD-10-CM | POA: Diagnosis not present

## 2016-10-14 DIAGNOSIS — E440 Moderate protein-calorie malnutrition: Secondary | ICD-10-CM | POA: Diagnosis not present

## 2016-10-14 DIAGNOSIS — Z5189 Encounter for other specified aftercare: Secondary | ICD-10-CM | POA: Diagnosis not present

## 2016-10-15 DIAGNOSIS — Z5189 Encounter for other specified aftercare: Secondary | ICD-10-CM | POA: Diagnosis not present

## 2016-10-15 DIAGNOSIS — N186 End stage renal disease: Secondary | ICD-10-CM | POA: Diagnosis not present

## 2016-10-15 DIAGNOSIS — D509 Iron deficiency anemia, unspecified: Secondary | ICD-10-CM | POA: Diagnosis not present

## 2016-10-15 DIAGNOSIS — D631 Anemia in chronic kidney disease: Secondary | ICD-10-CM | POA: Diagnosis not present

## 2016-10-15 DIAGNOSIS — E440 Moderate protein-calorie malnutrition: Secondary | ICD-10-CM | POA: Diagnosis not present

## 2016-10-15 DIAGNOSIS — Z79899 Other long term (current) drug therapy: Secondary | ICD-10-CM | POA: Diagnosis not present

## 2016-10-16 DIAGNOSIS — Z5189 Encounter for other specified aftercare: Secondary | ICD-10-CM | POA: Diagnosis not present

## 2016-10-16 DIAGNOSIS — N186 End stage renal disease: Secondary | ICD-10-CM | POA: Diagnosis not present

## 2016-10-16 DIAGNOSIS — Z79899 Other long term (current) drug therapy: Secondary | ICD-10-CM | POA: Diagnosis not present

## 2016-10-16 DIAGNOSIS — D631 Anemia in chronic kidney disease: Secondary | ICD-10-CM | POA: Diagnosis not present

## 2016-10-16 DIAGNOSIS — E440 Moderate protein-calorie malnutrition: Secondary | ICD-10-CM | POA: Diagnosis not present

## 2016-10-16 DIAGNOSIS — D509 Iron deficiency anemia, unspecified: Secondary | ICD-10-CM | POA: Diagnosis not present

## 2016-10-17 DIAGNOSIS — D509 Iron deficiency anemia, unspecified: Secondary | ICD-10-CM | POA: Diagnosis not present

## 2016-10-17 DIAGNOSIS — D631 Anemia in chronic kidney disease: Secondary | ICD-10-CM | POA: Diagnosis not present

## 2016-10-17 DIAGNOSIS — Z5189 Encounter for other specified aftercare: Secondary | ICD-10-CM | POA: Diagnosis not present

## 2016-10-17 DIAGNOSIS — E440 Moderate protein-calorie malnutrition: Secondary | ICD-10-CM | POA: Diagnosis not present

## 2016-10-17 DIAGNOSIS — N186 End stage renal disease: Secondary | ICD-10-CM | POA: Diagnosis not present

## 2016-10-17 DIAGNOSIS — Z79899 Other long term (current) drug therapy: Secondary | ICD-10-CM | POA: Diagnosis not present

## 2016-10-18 DIAGNOSIS — D631 Anemia in chronic kidney disease: Secondary | ICD-10-CM | POA: Diagnosis not present

## 2016-10-18 DIAGNOSIS — N186 End stage renal disease: Secondary | ICD-10-CM | POA: Diagnosis not present

## 2016-10-18 DIAGNOSIS — Z5189 Encounter for other specified aftercare: Secondary | ICD-10-CM | POA: Diagnosis not present

## 2016-10-18 DIAGNOSIS — D509 Iron deficiency anemia, unspecified: Secondary | ICD-10-CM | POA: Diagnosis not present

## 2016-10-18 DIAGNOSIS — Z79899 Other long term (current) drug therapy: Secondary | ICD-10-CM | POA: Diagnosis not present

## 2016-10-18 DIAGNOSIS — E440 Moderate protein-calorie malnutrition: Secondary | ICD-10-CM | POA: Diagnosis not present

## 2016-10-19 DIAGNOSIS — D509 Iron deficiency anemia, unspecified: Secondary | ICD-10-CM | POA: Diagnosis not present

## 2016-10-19 DIAGNOSIS — E440 Moderate protein-calorie malnutrition: Secondary | ICD-10-CM | POA: Diagnosis not present

## 2016-10-19 DIAGNOSIS — Z79899 Other long term (current) drug therapy: Secondary | ICD-10-CM | POA: Diagnosis not present

## 2016-10-19 DIAGNOSIS — N186 End stage renal disease: Secondary | ICD-10-CM | POA: Diagnosis not present

## 2016-10-19 DIAGNOSIS — D631 Anemia in chronic kidney disease: Secondary | ICD-10-CM | POA: Diagnosis not present

## 2016-10-19 DIAGNOSIS — Z5189 Encounter for other specified aftercare: Secondary | ICD-10-CM | POA: Diagnosis not present

## 2016-10-20 DIAGNOSIS — Z79899 Other long term (current) drug therapy: Secondary | ICD-10-CM | POA: Diagnosis not present

## 2016-10-20 DIAGNOSIS — Z5189 Encounter for other specified aftercare: Secondary | ICD-10-CM | POA: Diagnosis not present

## 2016-10-20 DIAGNOSIS — N186 End stage renal disease: Secondary | ICD-10-CM | POA: Diagnosis not present

## 2016-10-20 DIAGNOSIS — D631 Anemia in chronic kidney disease: Secondary | ICD-10-CM | POA: Diagnosis not present

## 2016-10-20 DIAGNOSIS — E440 Moderate protein-calorie malnutrition: Secondary | ICD-10-CM | POA: Diagnosis not present

## 2016-10-20 DIAGNOSIS — D509 Iron deficiency anemia, unspecified: Secondary | ICD-10-CM | POA: Diagnosis not present

## 2016-10-21 DIAGNOSIS — D509 Iron deficiency anemia, unspecified: Secondary | ICD-10-CM | POA: Diagnosis not present

## 2016-10-21 DIAGNOSIS — N186 End stage renal disease: Secondary | ICD-10-CM | POA: Diagnosis not present

## 2016-10-21 DIAGNOSIS — Z79899 Other long term (current) drug therapy: Secondary | ICD-10-CM | POA: Diagnosis not present

## 2016-10-21 DIAGNOSIS — E440 Moderate protein-calorie malnutrition: Secondary | ICD-10-CM | POA: Diagnosis not present

## 2016-10-21 DIAGNOSIS — Z5189 Encounter for other specified aftercare: Secondary | ICD-10-CM | POA: Diagnosis not present

## 2016-10-21 DIAGNOSIS — D631 Anemia in chronic kidney disease: Secondary | ICD-10-CM | POA: Diagnosis not present

## 2016-10-22 DIAGNOSIS — E440 Moderate protein-calorie malnutrition: Secondary | ICD-10-CM | POA: Diagnosis not present

## 2016-10-22 DIAGNOSIS — Z79899 Other long term (current) drug therapy: Secondary | ICD-10-CM | POA: Diagnosis not present

## 2016-10-22 DIAGNOSIS — D631 Anemia in chronic kidney disease: Secondary | ICD-10-CM | POA: Diagnosis not present

## 2016-10-22 DIAGNOSIS — N186 End stage renal disease: Secondary | ICD-10-CM | POA: Diagnosis not present

## 2016-10-22 DIAGNOSIS — Z5189 Encounter for other specified aftercare: Secondary | ICD-10-CM | POA: Diagnosis not present

## 2016-10-22 DIAGNOSIS — D509 Iron deficiency anemia, unspecified: Secondary | ICD-10-CM | POA: Diagnosis not present

## 2016-10-23 DIAGNOSIS — D631 Anemia in chronic kidney disease: Secondary | ICD-10-CM | POA: Diagnosis not present

## 2016-10-23 DIAGNOSIS — Z79899 Other long term (current) drug therapy: Secondary | ICD-10-CM | POA: Diagnosis not present

## 2016-10-23 DIAGNOSIS — Z5189 Encounter for other specified aftercare: Secondary | ICD-10-CM | POA: Diagnosis not present

## 2016-10-23 DIAGNOSIS — N186 End stage renal disease: Secondary | ICD-10-CM | POA: Diagnosis not present

## 2016-10-23 DIAGNOSIS — E440 Moderate protein-calorie malnutrition: Secondary | ICD-10-CM | POA: Diagnosis not present

## 2016-10-23 DIAGNOSIS — D509 Iron deficiency anemia, unspecified: Secondary | ICD-10-CM | POA: Diagnosis not present

## 2016-10-24 DIAGNOSIS — Z5189 Encounter for other specified aftercare: Secondary | ICD-10-CM | POA: Diagnosis not present

## 2016-10-24 DIAGNOSIS — Z79899 Other long term (current) drug therapy: Secondary | ICD-10-CM | POA: Diagnosis not present

## 2016-10-24 DIAGNOSIS — D631 Anemia in chronic kidney disease: Secondary | ICD-10-CM | POA: Diagnosis not present

## 2016-10-24 DIAGNOSIS — E440 Moderate protein-calorie malnutrition: Secondary | ICD-10-CM | POA: Diagnosis not present

## 2016-10-24 DIAGNOSIS — D509 Iron deficiency anemia, unspecified: Secondary | ICD-10-CM | POA: Diagnosis not present

## 2016-10-24 DIAGNOSIS — N186 End stage renal disease: Secondary | ICD-10-CM | POA: Diagnosis not present

## 2016-10-25 DIAGNOSIS — N186 End stage renal disease: Secondary | ICD-10-CM | POA: Diagnosis not present

## 2016-10-25 DIAGNOSIS — D631 Anemia in chronic kidney disease: Secondary | ICD-10-CM | POA: Diagnosis not present

## 2016-10-25 DIAGNOSIS — E440 Moderate protein-calorie malnutrition: Secondary | ICD-10-CM | POA: Diagnosis not present

## 2016-10-25 DIAGNOSIS — D509 Iron deficiency anemia, unspecified: Secondary | ICD-10-CM | POA: Diagnosis not present

## 2016-10-25 DIAGNOSIS — Z5189 Encounter for other specified aftercare: Secondary | ICD-10-CM | POA: Diagnosis not present

## 2016-10-25 DIAGNOSIS — Z79899 Other long term (current) drug therapy: Secondary | ICD-10-CM | POA: Diagnosis not present

## 2016-10-26 DIAGNOSIS — Z992 Dependence on renal dialysis: Secondary | ICD-10-CM | POA: Diagnosis not present

## 2016-10-26 DIAGNOSIS — N186 End stage renal disease: Secondary | ICD-10-CM | POA: Diagnosis not present

## 2016-10-26 DIAGNOSIS — E440 Moderate protein-calorie malnutrition: Secondary | ICD-10-CM | POA: Diagnosis not present

## 2016-10-26 DIAGNOSIS — D631 Anemia in chronic kidney disease: Secondary | ICD-10-CM | POA: Diagnosis not present

## 2016-10-26 DIAGNOSIS — D509 Iron deficiency anemia, unspecified: Secondary | ICD-10-CM | POA: Diagnosis not present

## 2016-10-26 DIAGNOSIS — Z5189 Encounter for other specified aftercare: Secondary | ICD-10-CM | POA: Diagnosis not present

## 2016-10-26 DIAGNOSIS — I129 Hypertensive chronic kidney disease with stage 1 through stage 4 chronic kidney disease, or unspecified chronic kidney disease: Secondary | ICD-10-CM | POA: Diagnosis not present

## 2016-10-26 DIAGNOSIS — Z79899 Other long term (current) drug therapy: Secondary | ICD-10-CM | POA: Diagnosis not present

## 2016-10-27 DIAGNOSIS — E440 Moderate protein-calorie malnutrition: Secondary | ICD-10-CM | POA: Diagnosis not present

## 2016-10-27 DIAGNOSIS — Z79899 Other long term (current) drug therapy: Secondary | ICD-10-CM | POA: Diagnosis not present

## 2016-10-27 DIAGNOSIS — Z5189 Encounter for other specified aftercare: Secondary | ICD-10-CM | POA: Diagnosis not present

## 2016-10-27 DIAGNOSIS — N2581 Secondary hyperparathyroidism of renal origin: Secondary | ICD-10-CM | POA: Diagnosis not present

## 2016-10-27 DIAGNOSIS — K769 Liver disease, unspecified: Secondary | ICD-10-CM | POA: Diagnosis not present

## 2016-10-27 DIAGNOSIS — D509 Iron deficiency anemia, unspecified: Secondary | ICD-10-CM | POA: Diagnosis not present

## 2016-10-27 DIAGNOSIS — N186 End stage renal disease: Secondary | ICD-10-CM | POA: Diagnosis not present

## 2016-10-27 DIAGNOSIS — D631 Anemia in chronic kidney disease: Secondary | ICD-10-CM | POA: Diagnosis not present

## 2016-10-28 DIAGNOSIS — K769 Liver disease, unspecified: Secondary | ICD-10-CM | POA: Diagnosis not present

## 2016-10-28 DIAGNOSIS — Z79899 Other long term (current) drug therapy: Secondary | ICD-10-CM | POA: Diagnosis not present

## 2016-10-28 DIAGNOSIS — N186 End stage renal disease: Secondary | ICD-10-CM | POA: Diagnosis not present

## 2016-10-28 DIAGNOSIS — E440 Moderate protein-calorie malnutrition: Secondary | ICD-10-CM | POA: Diagnosis not present

## 2016-10-28 DIAGNOSIS — D509 Iron deficiency anemia, unspecified: Secondary | ICD-10-CM | POA: Diagnosis not present

## 2016-10-28 DIAGNOSIS — D631 Anemia in chronic kidney disease: Secondary | ICD-10-CM | POA: Diagnosis not present

## 2016-10-29 DIAGNOSIS — D631 Anemia in chronic kidney disease: Secondary | ICD-10-CM | POA: Diagnosis not present

## 2016-10-29 DIAGNOSIS — K769 Liver disease, unspecified: Secondary | ICD-10-CM | POA: Diagnosis not present

## 2016-10-29 DIAGNOSIS — E440 Moderate protein-calorie malnutrition: Secondary | ICD-10-CM | POA: Diagnosis not present

## 2016-10-29 DIAGNOSIS — N186 End stage renal disease: Secondary | ICD-10-CM | POA: Diagnosis not present

## 2016-10-29 DIAGNOSIS — Z79899 Other long term (current) drug therapy: Secondary | ICD-10-CM | POA: Diagnosis not present

## 2016-10-29 DIAGNOSIS — D509 Iron deficiency anemia, unspecified: Secondary | ICD-10-CM | POA: Diagnosis not present

## 2016-10-30 DIAGNOSIS — Z79899 Other long term (current) drug therapy: Secondary | ICD-10-CM | POA: Diagnosis not present

## 2016-10-30 DIAGNOSIS — E440 Moderate protein-calorie malnutrition: Secondary | ICD-10-CM | POA: Diagnosis not present

## 2016-10-30 DIAGNOSIS — N186 End stage renal disease: Secondary | ICD-10-CM | POA: Diagnosis not present

## 2016-10-30 DIAGNOSIS — D631 Anemia in chronic kidney disease: Secondary | ICD-10-CM | POA: Diagnosis not present

## 2016-10-30 DIAGNOSIS — D509 Iron deficiency anemia, unspecified: Secondary | ICD-10-CM | POA: Diagnosis not present

## 2016-10-30 DIAGNOSIS — K769 Liver disease, unspecified: Secondary | ICD-10-CM | POA: Diagnosis not present

## 2016-10-31 DIAGNOSIS — K769 Liver disease, unspecified: Secondary | ICD-10-CM | POA: Diagnosis not present

## 2016-10-31 DIAGNOSIS — N186 End stage renal disease: Secondary | ICD-10-CM | POA: Diagnosis not present

## 2016-10-31 DIAGNOSIS — D509 Iron deficiency anemia, unspecified: Secondary | ICD-10-CM | POA: Diagnosis not present

## 2016-10-31 DIAGNOSIS — D631 Anemia in chronic kidney disease: Secondary | ICD-10-CM | POA: Diagnosis not present

## 2016-10-31 DIAGNOSIS — Z79899 Other long term (current) drug therapy: Secondary | ICD-10-CM | POA: Diagnosis not present

## 2016-10-31 DIAGNOSIS — E440 Moderate protein-calorie malnutrition: Secondary | ICD-10-CM | POA: Diagnosis not present

## 2016-11-01 DIAGNOSIS — Z79899 Other long term (current) drug therapy: Secondary | ICD-10-CM | POA: Diagnosis not present

## 2016-11-01 DIAGNOSIS — E440 Moderate protein-calorie malnutrition: Secondary | ICD-10-CM | POA: Diagnosis not present

## 2016-11-01 DIAGNOSIS — D509 Iron deficiency anemia, unspecified: Secondary | ICD-10-CM | POA: Diagnosis not present

## 2016-11-01 DIAGNOSIS — R8299 Other abnormal findings in urine: Secondary | ICD-10-CM | POA: Diagnosis not present

## 2016-11-01 DIAGNOSIS — N186 End stage renal disease: Secondary | ICD-10-CM | POA: Diagnosis not present

## 2016-11-01 DIAGNOSIS — D631 Anemia in chronic kidney disease: Secondary | ICD-10-CM | POA: Diagnosis not present

## 2016-11-01 DIAGNOSIS — K769 Liver disease, unspecified: Secondary | ICD-10-CM | POA: Diagnosis not present

## 2016-11-02 DIAGNOSIS — K769 Liver disease, unspecified: Secondary | ICD-10-CM | POA: Diagnosis not present

## 2016-11-02 DIAGNOSIS — D509 Iron deficiency anemia, unspecified: Secondary | ICD-10-CM | POA: Diagnosis not present

## 2016-11-02 DIAGNOSIS — Z79899 Other long term (current) drug therapy: Secondary | ICD-10-CM | POA: Diagnosis not present

## 2016-11-02 DIAGNOSIS — N186 End stage renal disease: Secondary | ICD-10-CM | POA: Diagnosis not present

## 2016-11-02 DIAGNOSIS — D631 Anemia in chronic kidney disease: Secondary | ICD-10-CM | POA: Diagnosis not present

## 2016-11-02 DIAGNOSIS — E440 Moderate protein-calorie malnutrition: Secondary | ICD-10-CM | POA: Diagnosis not present

## 2016-11-03 DIAGNOSIS — Z79899 Other long term (current) drug therapy: Secondary | ICD-10-CM | POA: Diagnosis not present

## 2016-11-03 DIAGNOSIS — K769 Liver disease, unspecified: Secondary | ICD-10-CM | POA: Diagnosis not present

## 2016-11-03 DIAGNOSIS — D631 Anemia in chronic kidney disease: Secondary | ICD-10-CM | POA: Diagnosis not present

## 2016-11-03 DIAGNOSIS — N186 End stage renal disease: Secondary | ICD-10-CM | POA: Diagnosis not present

## 2016-11-03 DIAGNOSIS — D509 Iron deficiency anemia, unspecified: Secondary | ICD-10-CM | POA: Diagnosis not present

## 2016-11-03 DIAGNOSIS — E440 Moderate protein-calorie malnutrition: Secondary | ICD-10-CM | POA: Diagnosis not present

## 2016-11-04 DIAGNOSIS — K769 Liver disease, unspecified: Secondary | ICD-10-CM | POA: Diagnosis not present

## 2016-11-04 DIAGNOSIS — D631 Anemia in chronic kidney disease: Secondary | ICD-10-CM | POA: Diagnosis not present

## 2016-11-04 DIAGNOSIS — E440 Moderate protein-calorie malnutrition: Secondary | ICD-10-CM | POA: Diagnosis not present

## 2016-11-04 DIAGNOSIS — N186 End stage renal disease: Secondary | ICD-10-CM | POA: Diagnosis not present

## 2016-11-04 DIAGNOSIS — D509 Iron deficiency anemia, unspecified: Secondary | ICD-10-CM | POA: Diagnosis not present

## 2016-11-04 DIAGNOSIS — Z79899 Other long term (current) drug therapy: Secondary | ICD-10-CM | POA: Diagnosis not present

## 2016-11-05 DIAGNOSIS — Z79899 Other long term (current) drug therapy: Secondary | ICD-10-CM | POA: Diagnosis not present

## 2016-11-05 DIAGNOSIS — D509 Iron deficiency anemia, unspecified: Secondary | ICD-10-CM | POA: Diagnosis not present

## 2016-11-05 DIAGNOSIS — N186 End stage renal disease: Secondary | ICD-10-CM | POA: Diagnosis not present

## 2016-11-05 DIAGNOSIS — K769 Liver disease, unspecified: Secondary | ICD-10-CM | POA: Diagnosis not present

## 2016-11-05 DIAGNOSIS — D631 Anemia in chronic kidney disease: Secondary | ICD-10-CM | POA: Diagnosis not present

## 2016-11-05 DIAGNOSIS — E440 Moderate protein-calorie malnutrition: Secondary | ICD-10-CM | POA: Diagnosis not present

## 2016-11-06 DIAGNOSIS — D631 Anemia in chronic kidney disease: Secondary | ICD-10-CM | POA: Diagnosis not present

## 2016-11-06 DIAGNOSIS — K769 Liver disease, unspecified: Secondary | ICD-10-CM | POA: Diagnosis not present

## 2016-11-06 DIAGNOSIS — E440 Moderate protein-calorie malnutrition: Secondary | ICD-10-CM | POA: Diagnosis not present

## 2016-11-06 DIAGNOSIS — D509 Iron deficiency anemia, unspecified: Secondary | ICD-10-CM | POA: Diagnosis not present

## 2016-11-06 DIAGNOSIS — N186 End stage renal disease: Secondary | ICD-10-CM | POA: Diagnosis not present

## 2016-11-06 DIAGNOSIS — Z79899 Other long term (current) drug therapy: Secondary | ICD-10-CM | POA: Diagnosis not present

## 2016-11-07 DIAGNOSIS — Z79899 Other long term (current) drug therapy: Secondary | ICD-10-CM | POA: Diagnosis not present

## 2016-11-07 DIAGNOSIS — E440 Moderate protein-calorie malnutrition: Secondary | ICD-10-CM | POA: Diagnosis not present

## 2016-11-07 DIAGNOSIS — D631 Anemia in chronic kidney disease: Secondary | ICD-10-CM | POA: Diagnosis not present

## 2016-11-07 DIAGNOSIS — K769 Liver disease, unspecified: Secondary | ICD-10-CM | POA: Diagnosis not present

## 2016-11-07 DIAGNOSIS — D509 Iron deficiency anemia, unspecified: Secondary | ICD-10-CM | POA: Diagnosis not present

## 2016-11-07 DIAGNOSIS — N186 End stage renal disease: Secondary | ICD-10-CM | POA: Diagnosis not present

## 2016-11-08 DIAGNOSIS — N186 End stage renal disease: Secondary | ICD-10-CM | POA: Diagnosis not present

## 2016-11-08 DIAGNOSIS — E440 Moderate protein-calorie malnutrition: Secondary | ICD-10-CM | POA: Diagnosis not present

## 2016-11-08 DIAGNOSIS — D509 Iron deficiency anemia, unspecified: Secondary | ICD-10-CM | POA: Diagnosis not present

## 2016-11-08 DIAGNOSIS — D631 Anemia in chronic kidney disease: Secondary | ICD-10-CM | POA: Diagnosis not present

## 2016-11-08 DIAGNOSIS — Z79899 Other long term (current) drug therapy: Secondary | ICD-10-CM | POA: Diagnosis not present

## 2016-11-08 DIAGNOSIS — K769 Liver disease, unspecified: Secondary | ICD-10-CM | POA: Diagnosis not present

## 2016-11-09 DIAGNOSIS — D631 Anemia in chronic kidney disease: Secondary | ICD-10-CM | POA: Diagnosis not present

## 2016-11-09 DIAGNOSIS — D509 Iron deficiency anemia, unspecified: Secondary | ICD-10-CM | POA: Diagnosis not present

## 2016-11-09 DIAGNOSIS — E440 Moderate protein-calorie malnutrition: Secondary | ICD-10-CM | POA: Diagnosis not present

## 2016-11-09 DIAGNOSIS — N186 End stage renal disease: Secondary | ICD-10-CM | POA: Diagnosis not present

## 2016-11-09 DIAGNOSIS — Z79899 Other long term (current) drug therapy: Secondary | ICD-10-CM | POA: Diagnosis not present

## 2016-11-09 DIAGNOSIS — K769 Liver disease, unspecified: Secondary | ICD-10-CM | POA: Diagnosis not present

## 2016-11-10 DIAGNOSIS — E440 Moderate protein-calorie malnutrition: Secondary | ICD-10-CM | POA: Diagnosis not present

## 2016-11-10 DIAGNOSIS — Z79899 Other long term (current) drug therapy: Secondary | ICD-10-CM | POA: Diagnosis not present

## 2016-11-10 DIAGNOSIS — K769 Liver disease, unspecified: Secondary | ICD-10-CM | POA: Diagnosis not present

## 2016-11-10 DIAGNOSIS — D509 Iron deficiency anemia, unspecified: Secondary | ICD-10-CM | POA: Diagnosis not present

## 2016-11-10 DIAGNOSIS — D631 Anemia in chronic kidney disease: Secondary | ICD-10-CM | POA: Diagnosis not present

## 2016-11-10 DIAGNOSIS — N186 End stage renal disease: Secondary | ICD-10-CM | POA: Diagnosis not present

## 2016-11-11 DIAGNOSIS — D509 Iron deficiency anemia, unspecified: Secondary | ICD-10-CM | POA: Diagnosis not present

## 2016-11-11 DIAGNOSIS — K769 Liver disease, unspecified: Secondary | ICD-10-CM | POA: Diagnosis not present

## 2016-11-11 DIAGNOSIS — Z79899 Other long term (current) drug therapy: Secondary | ICD-10-CM | POA: Diagnosis not present

## 2016-11-11 DIAGNOSIS — E440 Moderate protein-calorie malnutrition: Secondary | ICD-10-CM | POA: Diagnosis not present

## 2016-11-11 DIAGNOSIS — N186 End stage renal disease: Secondary | ICD-10-CM | POA: Diagnosis not present

## 2016-11-11 DIAGNOSIS — D631 Anemia in chronic kidney disease: Secondary | ICD-10-CM | POA: Diagnosis not present

## 2016-11-12 DIAGNOSIS — D509 Iron deficiency anemia, unspecified: Secondary | ICD-10-CM | POA: Diagnosis not present

## 2016-11-12 DIAGNOSIS — Z79899 Other long term (current) drug therapy: Secondary | ICD-10-CM | POA: Diagnosis not present

## 2016-11-12 DIAGNOSIS — D631 Anemia in chronic kidney disease: Secondary | ICD-10-CM | POA: Diagnosis not present

## 2016-11-12 DIAGNOSIS — N186 End stage renal disease: Secondary | ICD-10-CM | POA: Diagnosis not present

## 2016-11-12 DIAGNOSIS — E440 Moderate protein-calorie malnutrition: Secondary | ICD-10-CM | POA: Diagnosis not present

## 2016-11-12 DIAGNOSIS — K769 Liver disease, unspecified: Secondary | ICD-10-CM | POA: Diagnosis not present

## 2016-11-13 DIAGNOSIS — Z79899 Other long term (current) drug therapy: Secondary | ICD-10-CM | POA: Diagnosis not present

## 2016-11-13 DIAGNOSIS — D509 Iron deficiency anemia, unspecified: Secondary | ICD-10-CM | POA: Diagnosis not present

## 2016-11-13 DIAGNOSIS — D631 Anemia in chronic kidney disease: Secondary | ICD-10-CM | POA: Diagnosis not present

## 2016-11-13 DIAGNOSIS — N186 End stage renal disease: Secondary | ICD-10-CM | POA: Diagnosis not present

## 2016-11-13 DIAGNOSIS — E440 Moderate protein-calorie malnutrition: Secondary | ICD-10-CM | POA: Diagnosis not present

## 2016-11-13 DIAGNOSIS — K769 Liver disease, unspecified: Secondary | ICD-10-CM | POA: Diagnosis not present

## 2016-11-14 DIAGNOSIS — D509 Iron deficiency anemia, unspecified: Secondary | ICD-10-CM | POA: Diagnosis not present

## 2016-11-14 DIAGNOSIS — D631 Anemia in chronic kidney disease: Secondary | ICD-10-CM | POA: Diagnosis not present

## 2016-11-14 DIAGNOSIS — K769 Liver disease, unspecified: Secondary | ICD-10-CM | POA: Diagnosis not present

## 2016-11-14 DIAGNOSIS — Z79899 Other long term (current) drug therapy: Secondary | ICD-10-CM | POA: Diagnosis not present

## 2016-11-14 DIAGNOSIS — E440 Moderate protein-calorie malnutrition: Secondary | ICD-10-CM | POA: Diagnosis not present

## 2016-11-14 DIAGNOSIS — N186 End stage renal disease: Secondary | ICD-10-CM | POA: Diagnosis not present

## 2016-11-15 DIAGNOSIS — E440 Moderate protein-calorie malnutrition: Secondary | ICD-10-CM | POA: Diagnosis not present

## 2016-11-15 DIAGNOSIS — K769 Liver disease, unspecified: Secondary | ICD-10-CM | POA: Diagnosis not present

## 2016-11-15 DIAGNOSIS — D631 Anemia in chronic kidney disease: Secondary | ICD-10-CM | POA: Diagnosis not present

## 2016-11-15 DIAGNOSIS — D509 Iron deficiency anemia, unspecified: Secondary | ICD-10-CM | POA: Diagnosis not present

## 2016-11-15 DIAGNOSIS — N186 End stage renal disease: Secondary | ICD-10-CM | POA: Diagnosis not present

## 2016-11-15 DIAGNOSIS — Z79899 Other long term (current) drug therapy: Secondary | ICD-10-CM | POA: Diagnosis not present

## 2016-11-16 DIAGNOSIS — Z79899 Other long term (current) drug therapy: Secondary | ICD-10-CM | POA: Diagnosis not present

## 2016-11-16 DIAGNOSIS — D509 Iron deficiency anemia, unspecified: Secondary | ICD-10-CM | POA: Diagnosis not present

## 2016-11-16 DIAGNOSIS — E440 Moderate protein-calorie malnutrition: Secondary | ICD-10-CM | POA: Diagnosis not present

## 2016-11-16 DIAGNOSIS — N186 End stage renal disease: Secondary | ICD-10-CM | POA: Diagnosis not present

## 2016-11-16 DIAGNOSIS — K769 Liver disease, unspecified: Secondary | ICD-10-CM | POA: Diagnosis not present

## 2016-11-16 DIAGNOSIS — D631 Anemia in chronic kidney disease: Secondary | ICD-10-CM | POA: Diagnosis not present

## 2016-11-17 DIAGNOSIS — Z79899 Other long term (current) drug therapy: Secondary | ICD-10-CM | POA: Diagnosis not present

## 2016-11-17 DIAGNOSIS — N186 End stage renal disease: Secondary | ICD-10-CM | POA: Diagnosis not present

## 2016-11-17 DIAGNOSIS — D509 Iron deficiency anemia, unspecified: Secondary | ICD-10-CM | POA: Diagnosis not present

## 2016-11-17 DIAGNOSIS — K769 Liver disease, unspecified: Secondary | ICD-10-CM | POA: Diagnosis not present

## 2016-11-17 DIAGNOSIS — E440 Moderate protein-calorie malnutrition: Secondary | ICD-10-CM | POA: Diagnosis not present

## 2016-11-17 DIAGNOSIS — D631 Anemia in chronic kidney disease: Secondary | ICD-10-CM | POA: Diagnosis not present

## 2016-11-18 DIAGNOSIS — Z79899 Other long term (current) drug therapy: Secondary | ICD-10-CM | POA: Diagnosis not present

## 2016-11-18 DIAGNOSIS — E440 Moderate protein-calorie malnutrition: Secondary | ICD-10-CM | POA: Diagnosis not present

## 2016-11-18 DIAGNOSIS — D631 Anemia in chronic kidney disease: Secondary | ICD-10-CM | POA: Diagnosis not present

## 2016-11-18 DIAGNOSIS — N186 End stage renal disease: Secondary | ICD-10-CM | POA: Diagnosis not present

## 2016-11-18 DIAGNOSIS — D509 Iron deficiency anemia, unspecified: Secondary | ICD-10-CM | POA: Diagnosis not present

## 2016-11-18 DIAGNOSIS — K769 Liver disease, unspecified: Secondary | ICD-10-CM | POA: Diagnosis not present

## 2016-11-19 DIAGNOSIS — Z79899 Other long term (current) drug therapy: Secondary | ICD-10-CM | POA: Diagnosis not present

## 2016-11-19 DIAGNOSIS — E440 Moderate protein-calorie malnutrition: Secondary | ICD-10-CM | POA: Diagnosis not present

## 2016-11-19 DIAGNOSIS — K769 Liver disease, unspecified: Secondary | ICD-10-CM | POA: Diagnosis not present

## 2016-11-19 DIAGNOSIS — D631 Anemia in chronic kidney disease: Secondary | ICD-10-CM | POA: Diagnosis not present

## 2016-11-19 DIAGNOSIS — N186 End stage renal disease: Secondary | ICD-10-CM | POA: Diagnosis not present

## 2016-11-19 DIAGNOSIS — D509 Iron deficiency anemia, unspecified: Secondary | ICD-10-CM | POA: Diagnosis not present

## 2016-11-20 DIAGNOSIS — N186 End stage renal disease: Secondary | ICD-10-CM | POA: Diagnosis not present

## 2016-11-20 DIAGNOSIS — E440 Moderate protein-calorie malnutrition: Secondary | ICD-10-CM | POA: Diagnosis not present

## 2016-11-20 DIAGNOSIS — Z79899 Other long term (current) drug therapy: Secondary | ICD-10-CM | POA: Diagnosis not present

## 2016-11-20 DIAGNOSIS — D509 Iron deficiency anemia, unspecified: Secondary | ICD-10-CM | POA: Diagnosis not present

## 2016-11-20 DIAGNOSIS — D631 Anemia in chronic kidney disease: Secondary | ICD-10-CM | POA: Diagnosis not present

## 2016-11-20 DIAGNOSIS — K769 Liver disease, unspecified: Secondary | ICD-10-CM | POA: Diagnosis not present

## 2016-11-21 DIAGNOSIS — N186 End stage renal disease: Secondary | ICD-10-CM | POA: Diagnosis not present

## 2016-11-21 DIAGNOSIS — D631 Anemia in chronic kidney disease: Secondary | ICD-10-CM | POA: Diagnosis not present

## 2016-11-21 DIAGNOSIS — Z79899 Other long term (current) drug therapy: Secondary | ICD-10-CM | POA: Diagnosis not present

## 2016-11-21 DIAGNOSIS — D509 Iron deficiency anemia, unspecified: Secondary | ICD-10-CM | POA: Diagnosis not present

## 2016-11-21 DIAGNOSIS — E440 Moderate protein-calorie malnutrition: Secondary | ICD-10-CM | POA: Diagnosis not present

## 2016-11-21 DIAGNOSIS — K769 Liver disease, unspecified: Secondary | ICD-10-CM | POA: Diagnosis not present

## 2016-11-22 DIAGNOSIS — Z79899 Other long term (current) drug therapy: Secondary | ICD-10-CM | POA: Diagnosis not present

## 2016-11-22 DIAGNOSIS — D509 Iron deficiency anemia, unspecified: Secondary | ICD-10-CM | POA: Diagnosis not present

## 2016-11-22 DIAGNOSIS — K769 Liver disease, unspecified: Secondary | ICD-10-CM | POA: Diagnosis not present

## 2016-11-22 DIAGNOSIS — D631 Anemia in chronic kidney disease: Secondary | ICD-10-CM | POA: Diagnosis not present

## 2016-11-22 DIAGNOSIS — E440 Moderate protein-calorie malnutrition: Secondary | ICD-10-CM | POA: Diagnosis not present

## 2016-11-22 DIAGNOSIS — N186 End stage renal disease: Secondary | ICD-10-CM | POA: Diagnosis not present

## 2016-11-23 DIAGNOSIS — K769 Liver disease, unspecified: Secondary | ICD-10-CM | POA: Diagnosis not present

## 2016-11-23 DIAGNOSIS — D509 Iron deficiency anemia, unspecified: Secondary | ICD-10-CM | POA: Diagnosis not present

## 2016-11-23 DIAGNOSIS — D631 Anemia in chronic kidney disease: Secondary | ICD-10-CM | POA: Diagnosis not present

## 2016-11-23 DIAGNOSIS — Z79899 Other long term (current) drug therapy: Secondary | ICD-10-CM | POA: Diagnosis not present

## 2016-11-23 DIAGNOSIS — E440 Moderate protein-calorie malnutrition: Secondary | ICD-10-CM | POA: Diagnosis not present

## 2016-11-23 DIAGNOSIS — N186 End stage renal disease: Secondary | ICD-10-CM | POA: Diagnosis not present

## 2016-11-24 ENCOUNTER — Emergency Department (HOSPITAL_COMMUNITY): Payer: Medicare Other

## 2016-11-24 ENCOUNTER — Inpatient Hospital Stay (HOSPITAL_COMMUNITY)
Admission: EM | Admit: 2016-11-24 | Discharge: 2016-12-01 | DRG: 037 | Disposition: A | Discharge status: Rehabilitation Facility | Payer: Medicare Other | Attending: Family Medicine | Admitting: Family Medicine

## 2016-11-24 ENCOUNTER — Encounter (HOSPITAL_COMMUNITY): Payer: Self-pay | Admitting: *Deleted

## 2016-11-24 DIAGNOSIS — R402212 Coma scale, best verbal response, none, at arrival to emergency department: Secondary | ICD-10-CM | POA: Diagnosis present

## 2016-11-24 DIAGNOSIS — Z79899 Other long term (current) drug therapy: Secondary | ICD-10-CM | POA: Diagnosis not present

## 2016-11-24 DIAGNOSIS — R4701 Aphasia: Secondary | ICD-10-CM | POA: Diagnosis present

## 2016-11-24 DIAGNOSIS — Z87891 Personal history of nicotine dependence: Secondary | ICD-10-CM

## 2016-11-24 DIAGNOSIS — R29810 Facial weakness: Secondary | ICD-10-CM | POA: Diagnosis present

## 2016-11-24 DIAGNOSIS — K769 Liver disease, unspecified: Secondary | ICD-10-CM | POA: Diagnosis not present

## 2016-11-24 DIAGNOSIS — M898X9 Other specified disorders of bone, unspecified site: Secondary | ICD-10-CM | POA: Diagnosis present

## 2016-11-24 DIAGNOSIS — D509 Iron deficiency anemia, unspecified: Secondary | ICD-10-CM | POA: Diagnosis not present

## 2016-11-24 DIAGNOSIS — E876 Hypokalemia: Secondary | ICD-10-CM | POA: Diagnosis present

## 2016-11-24 DIAGNOSIS — G40901 Epilepsy, unspecified, not intractable, with status epilepticus: Secondary | ICD-10-CM | POA: Diagnosis not present

## 2016-11-24 DIAGNOSIS — G9340 Encephalopathy, unspecified: Secondary | ICD-10-CM | POA: Diagnosis not present

## 2016-11-24 DIAGNOSIS — Z4682 Encounter for fitting and adjustment of non-vascular catheter: Secondary | ICD-10-CM | POA: Diagnosis not present

## 2016-11-24 DIAGNOSIS — E1122 Type 2 diabetes mellitus with diabetic chronic kidney disease: Secondary | ICD-10-CM | POA: Diagnosis present

## 2016-11-24 DIAGNOSIS — Z794 Long term (current) use of insulin: Secondary | ICD-10-CM | POA: Diagnosis not present

## 2016-11-24 DIAGNOSIS — T829XXA Unspecified complication of cardiac and vascular prosthetic device, implant and graft, initial encounter: Secondary | ICD-10-CM

## 2016-11-24 DIAGNOSIS — T82858A Stenosis of vascular prosthetic devices, implants and grafts, initial encounter: Secondary | ICD-10-CM | POA: Diagnosis present

## 2016-11-24 DIAGNOSIS — T82868A Thrombosis of vascular prosthetic devices, implants and grafts, initial encounter: Secondary | ICD-10-CM | POA: Diagnosis present

## 2016-11-24 DIAGNOSIS — K5900 Constipation, unspecified: Secondary | ICD-10-CM | POA: Diagnosis present

## 2016-11-24 DIAGNOSIS — J9600 Acute respiratory failure, unspecified whether with hypoxia or hypercapnia: Secondary | ICD-10-CM | POA: Diagnosis not present

## 2016-11-24 DIAGNOSIS — R402312 Coma scale, best motor response, none, at arrival to emergency department: Secondary | ICD-10-CM | POA: Diagnosis present

## 2016-11-24 DIAGNOSIS — D631 Anemia in chronic kidney disease: Secondary | ICD-10-CM | POA: Diagnosis not present

## 2016-11-24 DIAGNOSIS — E1165 Type 2 diabetes mellitus with hyperglycemia: Secondary | ICD-10-CM | POA: Diagnosis not present

## 2016-11-24 DIAGNOSIS — I120 Hypertensive chronic kidney disease with stage 5 chronic kidney disease or end stage renal disease: Secondary | ICD-10-CM | POA: Diagnosis present

## 2016-11-24 DIAGNOSIS — J9811 Atelectasis: Secondary | ICD-10-CM

## 2016-11-24 DIAGNOSIS — J9601 Acute respiratory failure with hypoxia: Secondary | ICD-10-CM | POA: Diagnosis present

## 2016-11-24 DIAGNOSIS — R29818 Other symptoms and signs involving the nervous system: Secondary | ICD-10-CM | POA: Diagnosis not present

## 2016-11-24 DIAGNOSIS — D638 Anemia in other chronic diseases classified elsewhere: Secondary | ICD-10-CM | POA: Diagnosis present

## 2016-11-24 DIAGNOSIS — Z992 Dependence on renal dialysis: Secondary | ICD-10-CM

## 2016-11-24 DIAGNOSIS — I639 Cerebral infarction, unspecified: Secondary | ICD-10-CM

## 2016-11-24 DIAGNOSIS — G40801 Other epilepsy, not intractable, with status epilepticus: Secondary | ICD-10-CM | POA: Diagnosis not present

## 2016-11-24 DIAGNOSIS — Z9119 Patient's noncompliance with other medical treatment and regimen: Secondary | ICD-10-CM

## 2016-11-24 DIAGNOSIS — G40401 Other generalized epilepsy and epileptic syndromes, not intractable, with status epilepticus: Principal | ICD-10-CM | POA: Diagnosis present

## 2016-11-24 DIAGNOSIS — N186 End stage renal disease: Secondary | ICD-10-CM | POA: Diagnosis not present

## 2016-11-24 DIAGNOSIS — R402112 Coma scale, eyes open, never, at arrival to emergency department: Secondary | ICD-10-CM | POA: Diagnosis present

## 2016-11-24 DIAGNOSIS — R918 Other nonspecific abnormal finding of lung field: Secondary | ICD-10-CM | POA: Diagnosis not present

## 2016-11-24 DIAGNOSIS — I129 Hypertensive chronic kidney disease with stage 1 through stage 4 chronic kidney disease, or unspecified chronic kidney disease: Secondary | ICD-10-CM | POA: Diagnosis not present

## 2016-11-24 DIAGNOSIS — I10 Essential (primary) hypertension: Secondary | ICD-10-CM | POA: Diagnosis not present

## 2016-11-24 DIAGNOSIS — R55 Syncope and collapse: Secondary | ICD-10-CM | POA: Diagnosis not present

## 2016-11-24 DIAGNOSIS — Y832 Surgical operation with anastomosis, bypass or graft as the cause of abnormal reaction of the patient, or of later complication, without mention of misadventure at the time of the procedure: Secondary | ICD-10-CM | POA: Diagnosis present

## 2016-11-24 DIAGNOSIS — I169 Hypertensive crisis, unspecified: Secondary | ICD-10-CM | POA: Diagnosis present

## 2016-11-24 DIAGNOSIS — N2581 Secondary hyperparathyroidism of renal origin: Secondary | ICD-10-CM | POA: Diagnosis present

## 2016-11-24 DIAGNOSIS — G9341 Metabolic encephalopathy: Secondary | ICD-10-CM | POA: Diagnosis present

## 2016-11-24 DIAGNOSIS — I6789 Other cerebrovascular disease: Secondary | ICD-10-CM | POA: Diagnosis not present

## 2016-11-24 DIAGNOSIS — E1129 Type 2 diabetes mellitus with other diabetic kidney complication: Secondary | ICD-10-CM | POA: Diagnosis not present

## 2016-11-24 DIAGNOSIS — R569 Unspecified convulsions: Secondary | ICD-10-CM | POA: Diagnosis not present

## 2016-11-24 DIAGNOSIS — Z880 Allergy status to penicillin: Secondary | ICD-10-CM | POA: Diagnosis not present

## 2016-11-24 DIAGNOSIS — I150 Renovascular hypertension: Secondary | ICD-10-CM | POA: Diagnosis not present

## 2016-11-24 DIAGNOSIS — R5381 Other malaise: Secondary | ICD-10-CM | POA: Diagnosis not present

## 2016-11-24 DIAGNOSIS — E440 Moderate protein-calorie malnutrition: Secondary | ICD-10-CM | POA: Diagnosis not present

## 2016-11-24 LAB — CBG MONITORING, ED: Glucose-Capillary: >600 mg/dL (ref 65–99)

## 2016-11-24 LAB — I-STAT CG4 LACTIC ACID, ED: Lactic Acid, Venous: 12.94 mmol/L (ref 0.5–1.9)

## 2016-11-24 LAB — DIFFERENTIAL
Basophils Absolute: 0.1 10*3/uL (ref 0.0–0.1)
Basophils Relative: 2 %
EOS PCT: 3 %
Eosinophils Absolute: 0.2 10*3/uL (ref 0.0–0.7)
LYMPHS PCT: 40 %
Lymphs Abs: 2.7 10*3/uL (ref 0.7–4.0)
MONO ABS: 0.5 10*3/uL (ref 0.1–1.0)
MONOS PCT: 7 %
Neutro Abs: 3.3 10*3/uL (ref 1.7–7.7)
Neutrophils Relative %: 48 %

## 2016-11-24 LAB — I-STAT ARTERIAL BLOOD GAS, ED
Acid-base deficit: 5 mmol/L — ABNORMAL HIGH (ref 0.0–2.0)
Bicarbonate: 19.1 mmol/L — ABNORMAL LOW (ref 20.0–28.0)
O2 Saturation: 89 %
PCO2 ART: 32.4 mmHg (ref 32.0–48.0)
PH ART: 7.375 (ref 7.350–7.450)
Patient temperature: 97.6
TCO2: 20 mmol/L (ref 0–100)
pO2, Arterial: 55 mmHg — ABNORMAL LOW (ref 83.0–108.0)

## 2016-11-24 LAB — CREATININE, SERUM
Creatinine, Ser: 11.39 mg/dL — ABNORMAL HIGH (ref 0.61–1.24)
GFR, EST AFRICAN AMERICAN: 5 mL/min — AB (ref >60)
GFR, EST NON AFRICAN AMERICAN: 4 mL/min — AB (ref >60)

## 2016-11-24 LAB — CBC
HCT: 43 % (ref 39.0–52.0)
HCT: 39.2 % (ref 39.0–52.0)
Hemoglobin: 13.6 g/dL (ref 13.0–17.0)
Hemoglobin: 12.7 g/dL — ABNORMAL LOW (ref 13.0–17.0)
MCH: 27.5 pg (ref 26.0–34.0)
MCH: 26.8 pg (ref 26.0–34.0)
MCHC: 31.6 g/dL (ref 30.0–36.0)
MCHC: 32.4 g/dL (ref 30.0–36.0)
MCV: 86.9 fL (ref 78.0–100.0)
MCV: 82.9 fL (ref 78.0–100.0)
PLATELETS: 245 10*3/uL (ref 150–400)
Platelets: 248 10*3/uL (ref 150–400)
RBC: 4.95 MIL/uL (ref 4.22–5.81)
RBC: 4.73 MIL/uL (ref 4.22–5.81)
RDW: 16.3 % — ABNORMAL HIGH (ref 11.5–15.5)
RDW: 16.2 % — ABNORMAL HIGH (ref 11.5–15.5)
WBC: 6.7 10*3/uL (ref 4.0–10.5)
WBC: 6.1 10*3/uL (ref 4.0–10.5)

## 2016-11-24 LAB — COMPREHENSIVE METABOLIC PANEL
ALK PHOS: 69 U/L (ref 38–126)
ALT: 22 U/L (ref 17–63)
ANION GAP: 25 — AB (ref 5–15)
AST: 32 U/L (ref 15–41)
Albumin: 3.1 g/dL — ABNORMAL LOW (ref 3.5–5.0)
BILIRUBIN TOTAL: 0.6 mg/dL (ref 0.3–1.2)
BUN: 55 mg/dL — ABNORMAL HIGH (ref 6–20)
CALCIUM: 9.4 mg/dL (ref 8.9–10.3)
CO2: 12 mmol/L — AB (ref 22–32)
Chloride: 98 mmol/L — ABNORMAL LOW (ref 101–111)
Creatinine, Ser: 11.43 mg/dL — ABNORMAL HIGH (ref 0.61–1.24)
GFR, EST AFRICAN AMERICAN: 5 mL/min — AB (ref >60)
GFR, EST NON AFRICAN AMERICAN: 4 mL/min — AB (ref >60)
Glucose, Bld: 739 mg/dL (ref 65–99)
Potassium: 4.6 mmol/L (ref 3.5–5.1)
SODIUM: 135 mmol/L (ref 135–145)
TOTAL PROTEIN: 7.5 g/dL (ref 6.5–8.1)

## 2016-11-24 LAB — I-STAT TROPONIN, ED: TROPONIN I, POC: 0.01 ng/mL (ref 0.00–0.08)

## 2016-11-24 LAB — I-STAT CHEM 8, ED
BUN: 54 mg/dL — ABNORMAL HIGH (ref 6–20)
CREATININE: 11.1 mg/dL — AB (ref 0.61–1.24)
Calcium, Ion: 1.11 mmol/L — ABNORMAL LOW (ref 1.15–1.40)
Chloride: 102 mmol/L (ref 101–111)
HCT: 46 % (ref 39.0–52.0)
HEMOGLOBIN: 15.6 g/dL (ref 13.0–17.0)
Potassium: 4.5 mmol/L (ref 3.5–5.1)
SODIUM: 135 mmol/L (ref 135–145)
TCO2: 17 mmol/L (ref 0–100)

## 2016-11-24 LAB — PROTIME-INR
INR: 1.17
PROTHROMBIN TIME: 15 s (ref 11.4–15.2)

## 2016-11-24 LAB — APTT: aPTT: 32 seconds (ref 24–36)

## 2016-11-24 LAB — ETHANOL

## 2016-11-24 MED ORDER — ETOMIDATE 2 MG/ML IV SOLN
20.0000 mg | Freq: Once | INTRAVENOUS | Status: AC
  Administered 2016-11-24: 20 mg via INTRAVENOUS

## 2016-11-24 MED ORDER — FENTANYL CITRATE (PF) 100 MCG/2ML IJ SOLN: 50.0000 ug | Freq: Once | INTRAMUSCULAR | Status: DC

## 2016-11-24 MED ORDER — SODIUM CHLORIDE 0.9 % IV SOLN
1000.0000 mg | Freq: Once | INTRAVENOUS | Status: AC
  Administered 2016-11-24: 1000 mg via INTRAVENOUS
  Filled 2016-11-24 (×2): qty 10

## 2016-11-24 MED ORDER — SODIUM CHLORIDE 0.9 % IV SOLN: 250.0000 mL | INTRAVENOUS | Status: DC | PRN

## 2016-11-24 MED ORDER — HEPARIN SODIUM (PORCINE) 5000 UNIT/ML IJ SOLN
5000.0000 [IU] | Freq: Three times a day (TID) | INTRAMUSCULAR | Status: DC
  Administered 2016-11-25 – 2016-12-01 (×19): 5000 [IU] via SUBCUTANEOUS
  Filled 2016-11-24 (×19): qty 1

## 2016-11-24 MED ORDER — LABETALOL HCL 5 MG/ML IV SOLN
20.0000 mg | INTRAVENOUS | Status: AC | PRN
  Administered 2016-11-25 – 2016-11-28 (×10): 20 mg via INTRAVENOUS
  Filled 2016-11-24 (×9): qty 4

## 2016-11-24 MED ORDER — FENTANYL BOLUS VIA INFUSION
50.0000 ug | INTRAVENOUS | Status: DC | PRN
  Filled 2016-11-24: qty 50

## 2016-11-24 MED ORDER — MIDAZOLAM BOLUS VIA INFUSION
1.0000 mg | INTRAVENOUS | Status: DC | PRN
  Filled 2016-11-24: qty 2

## 2016-11-24 MED ORDER — MIDAZOLAM HCL 2 MG/2ML IJ SOLN
15.0000 mg | Freq: Once | INTRAMUSCULAR | Status: AC
  Administered 2016-11-24: 15 mg via INTRAVENOUS

## 2016-11-24 MED ORDER — SODIUM CHLORIDE 0.9 % IV SOLN
INTRAVENOUS | Status: DC
  Administered 2016-11-24: 5.4 [IU]/h via INTRAVENOUS
  Filled 2016-11-24 (×2): qty 2.5

## 2016-11-24 MED ORDER — MIDAZOLAM HCL 2 MG/2ML IJ SOLN
INTRAMUSCULAR | Status: AC
  Filled 2016-11-24: qty 16

## 2016-11-24 MED ORDER — SODIUM CHLORIDE 0.9 % IV SOLN
INTRAVENOUS | Status: DC
  Administered 2016-11-24: via INTRAVENOUS

## 2016-11-24 MED ORDER — SODIUM CHLORIDE 0.9 % IV SOLN
25.0000 ug/h | INTRAVENOUS | Status: DC
  Administered 2016-11-24: 50 ug/h via INTRAVENOUS
  Filled 2016-11-24: qty 50

## 2016-11-24 MED ORDER — IOPAMIDOL (ISOVUE-370) INJECTION 76%
INTRAVENOUS | Status: AC
  Administered 2016-11-24: 50 mL via INTRAVENOUS
  Filled 2016-11-24: qty 50

## 2016-11-24 MED ORDER — DEXTROSE-NACL 5-0.45 % IV SOLN: INTRAVENOUS | Status: DC

## 2016-11-24 MED ORDER — SODIUM CHLORIDE 0.9 % IV BOLUS (SEPSIS)
1000.0000 mL | Freq: Once | INTRAVENOUS | Status: AC
  Administered 2016-11-24: 1000 mL via INTRAVENOUS

## 2016-11-24 MED ORDER — FAMOTIDINE 20 MG PO TABS
20.0000 mg | ORAL_TABLET | Freq: Two times a day (BID) | ORAL | Status: DC
  Administered 2016-11-24: 20 mg via ORAL
  Filled 2016-11-24: qty 1

## 2016-11-24 MED ORDER — PROPOFOL 1000 MG/100ML IV EMUL
INTRAVENOUS | Status: AC
  Filled 2016-11-24: qty 100

## 2016-11-24 MED ORDER — SODIUM CHLORIDE 0.9 % IV SOLN
0.0000 mg/h | INTRAVENOUS | Status: DC
  Administered 2016-11-24 – 2016-11-25 (×4): 15 mg/h via INTRAVENOUS
  Filled 2016-11-24 (×6): qty 10

## 2016-11-24 MED ORDER — SODIUM CHLORIDE 0.9 % IV SOLN
0.0000 mg/h | INTRAVENOUS | Status: DC
  Filled 2016-11-24: qty 10

## 2016-11-24 NOTE — ED Notes (Signed)
Lt sided facial droop

## 2016-11-24 NOTE — ED Notes (Signed)
Jared [name] (NT) rechecked CBG for pt and value exceeds measure with statement indicating "HI." Jared [name] (RN) notified of value.

## 2016-11-24 NOTE — H&P (Signed)
PULMONARY / CRITICAL CARE MEDICINE   Name: Jared [name] MRN: [id number] DOB: [birth year]    ADMISSION DATE:  11/24/2016 CONSULTATION DATE:    REFERRING MD:  EDP  CHIEF COMPLAINT:  Altered mental status, seizure, hyperglycemia  HISTORY OF PRESENT ILLNESS:   Jared [name] is a 52M with PMH significant for IDDM, HTN, and ESRD on peritoneal dialysis who presented to the ED via EMS after he had an acute change in mental status around 1700. He complained of a headache then stopped talking. He was triaged as a code stroke. He began seizing on arrival to the ED. He was then intubated. Initial blood glucose was > 600. He was markedly hypertensive with BP 220s/110s. CT and CTA head negative for acute findings. He was reportedly tremulous after CT imaging raising concern for status epilepticus. He was seen by neurology who felt the precipitant for his seizure was hyperglycemia. He was started on versed for sedation, loaded with keppra and EEG is ordered. He has been started on an insulin gtt for glycemic control.  Per the family (son, brother-in-law), he was doing okay until he became acutely disoriented around 5pm 12/29. He complained of headache and was noted to have shaking in his right upper extremity. He then complained of foot pain and shortly thereafter was unresponsive. BP was "off the charts" when EMS arrived. The family is unaware of any recent history of abdominal pain, issues with his PD, fever / chills, trouble with his blood sugars. His son thinks he may have been changing some of his medications lately, but doesn't know which ones.   PAST MEDICAL HISTORY :  He  has a past medical history of Anemia; Chronic kidney disease; Diabetes mellitus without complication ([place]); Hypertension; and Thyroid disease.  PAST SURGICAL HISTORY: He  has a past surgical history that includes Insertion of dialysis catheter (04/2014) and AV fistula placement (Left, 06/04/2014).  Allergies  Allergen Reactions  .  Penicillins Other (See Comments)    Has patient had a PCN reaction causing immediate rash, facial/tongue/throat swelling, SOB or lightheadedness with hypotension: Unk Has patient had a PCN reaction causing severe rash involving mucus membranes or skin necrosis: Unk Has patient had a PCN reaction that required hospitalization: Unk Has patient had a PCN reaction occurring within the last 10 years: Unk If all of the above answers are "NO", then may proceed with Cephalosporin use.     No current facility-administered medications on file prior to encounter.    Current Outpatient Prescriptions on File Prior to Encounter  Medication Sig  . amLODipine (NORVASC) 10 MG tablet Take 10 mg by mouth daily.  Marland Kitchen b complex-vitamin c-folic acid (NEPHRO-VITE) 0.8 MG TABS tablet Take 1 tablet by mouth at bedtime.  . Insulin NPH Human, Isophane, (NOVOLIN N [place]) Inject 4-19 Units into the skin 2 (two) times daily.   . insulin regular (NOVOLIN R,HUMULIN R) 100 units/mL injection Inject 2-6 Units into the skin 2 (two) times daily before a meal.   . metoprolol (LOPRESSOR) 100 MG tablet Take 100 mg by mouth 2 (two) times daily.   Marland Kitchen oxyCODONE (ROXICODONE) 5 MG immediate release tablet Take 1 tablet (5 mg total) by mouth every 6 (six) hours as needed for severe pain.  . sevelamer carbonate (RENVELA) 800 MG tablet Take 800 mg by mouth 3 (three) times daily with meals.    FAMILY HISTORY:  His indicated that his mother is alive. He indicated that his father is deceased.    SOCIAL HISTORY: He  reports that he quit smoking about 32 years ago. He has never used smokeless tobacco. He reports that he drinks alcohol. He reports that he does not use drugs.  REVIEW OF SYSTEMS:   Unable to obtain 2/2 intubated state  SUBJECTIVE:    VITAL SIGNS: BP (!) 218/123   Pulse (!) 124   Temp 97.6 F (36.4 C) (Rectal)   Resp 21   Ht 5\' 10"  (1.778 m)   Wt 89.7 kg (197 lb 12 oz)   SpO2 99%   BMI 28.37 kg/m   HEMODYNAMICS:     VENTILATOR SETTINGS: Vent Mode: PRVC FiO2 (%):  [70 %] 70 % Set Rate:  [15 bmp] 15 bmp Vt Set:  [580 mL] 580 mL PEEP:  [5 cmH20] 5 cmH20 Plateau Pressure:  [18 cmH20-19 cmH20] 18 cmH20  INTAKE / OUTPUT: No intake/output data recorded.  PHYSICAL EXAMINATION:  General Well nourished, well developed, no apparent distress  HEENT No gross abnormalities. ETT/OGT in place  Pulmonary Clear to auscultation bilaterally with no wheezes, rales or ronchi. Good effort, symmetrical expansion.   Cardiovascular Normal rate, regular rhythm. S1, s2. No m/r/g. Distal pulses palpable.  Abdomen Soft, non-tender, distended, positive bowel sounds, no palpable organomegaly or masses. Normoresonant to percussion. PD cathter insertion site clean and dry, no exudate or erythema  Musculoskeletal Grossly normal  Lymphatics No cervical, supraclavicular or axillary adenopathy.   Neurologic Grossly intact. No focal deficits. Responds briskly to noxious stimuli in all 4 extremities despite versed gtt @ 15   Skin/Integuement No rash, no cyanosis, no clubbing.      LABS:  BMET  Recent Labs Lab 11/24/16 1758 11/24/16 1804  NA 135 135  K 4.6 4.5  CL 98* 102  CO2 12*  --   BUN 55* 54*  CREATININE 11.43* 11.10*  GLUCOSE 739* >700*    Electrolytes  Recent Labs Lab 11/24/16 1758  CALCIUM 9.4    CBC  Recent Labs Lab 11/24/16 1758 11/24/16 1804  WBC 6.7  --   HGB 13.6 15.6  HCT 43.0 46.0  PLT 245  --     Coag's  Recent Labs Lab 11/24/16 1758  APTT 32  INR 1.17    Sepsis Markers  Recent Labs Lab 11/24/16 1809  LATICACIDVEN 12.94*    ABG No results for input(s): PHART, PCO2ART, PO2ART in the last 168 hours.  Liver Enzymes  Recent Labs Lab 11/24/16 1758  AST 32  ALT 22  ALKPHOS 69  BILITOT 0.6  ALBUMIN 3.1*    Cardiac Enzymes No results for input(s): TROPONINI, PROBNP in the last 168 hours.  Glucose  Recent Labs Lab 11/24/16 2021  GLUCAP >600*     Imaging Ct Angio Head W Or Wo Contrast  Result Date: 11/24/2016 CLINICAL DATA:  Dialysis patient. Diabetes. Unresponsive. Negative acute head CT. EXAM: CT ANGIOGRAPHY HEAD AND NECK TECHNIQUE: Multidetector CT imaging of the head and neck was performed using the standard protocol during bolus administration of intravenous contrast. Multiplanar CT image reconstructions and MIPs were obtained to evaluate the vascular anatomy. Carotid stenosis measurements (when applicable) are obtained utilizing NASCET criteria, using the distal internal carotid diameter as the denominator. CONTRAST:  50 cc Isovue 370 COMPARISON:  Earlier same day CT FINDINGS: CTA NECK FINDINGS Aortic arch: Mild atherosclerotic change. No aneurysm or dissection. Common origin of the innominate artery and left common carotid artery. Right carotid system: Common carotid artery is widely patent to the bifurcation. The carotid bifurcation does not show atherosclerotic change. No narrowing  or irregularity. Cervical ICA is normal. Left carotid system: Common carotid artery is widely patent to the bifurcation. The carotid bifurcation does not show atherosclerotic change. No narrowing or irregularity. Cervical ICA is normal. Vertebral arteries: Both vertebral artery origins are widely patent. Minimal atherosclerotic calcification at the left vertebral artery origin. Both vertebral arteries are widely patent through the cervical region to the foramen magnum. Skeleton: Negative Other neck: No significant soft tissue finding. Patient is intubated. Upper chest: Patchy dependent atelectasis and are infiltrate bilaterally. Possible aspiration or community acquired pneumonia. Review of the MIP images confirms the above findings CTA HEAD FINDINGS Anterior circulation: Both internal carotid arteries are widely patent through the skullbase and siphon regions. Mild peripheral siphon calcification without stenosis. Anterior and middle cerebral vessels are patent  without stenosis, aneurysm or vascular malformation. No missing branch vessels are identified. Posterior circulation: Both vertebral arteries are widely patent to the basilar. Mild calcification at the foramen magnum level without stenosis. No vertebral stenosis. Posterior circulation branch vessels are normal. Venous sinuses: Patent and normal Anatomic variants: None significant Delayed phase: No abnormal enhancement Review of the MIP images confirms the above findings IMPRESSION: CT angiography is negative for large vessel occlusion. Carotid bifurcations are normal. Only very minimal atherosclerotic change is demonstrated in the region imaged. These results were called by telephone at the time of interpretation on 11/24/2016 at 6:40 pm to Dr. Kerney Elbe , who verbally acknowledged these results. Initial code stroke head CT results also communicated at this time. Electronically Signed   By: Nelson Chimes M.D.   On: 11/24/2016 18:47   Ct Angio Neck W Or Wo Contrast  Result Date: 11/24/2016 CLINICAL DATA:  Dialysis patient. Diabetes. Unresponsive. Negative acute head CT. EXAM: CT ANGIOGRAPHY HEAD AND NECK TECHNIQUE: Multidetector CT imaging of the head and neck was performed using the standard protocol during bolus administration of intravenous contrast. Multiplanar CT image reconstructions and MIPs were obtained to evaluate the vascular anatomy. Carotid stenosis measurements (when applicable) are obtained utilizing NASCET criteria, using the distal internal carotid diameter as the denominator. CONTRAST:  50 cc Isovue 370 COMPARISON:  Earlier same day CT FINDINGS: CTA NECK FINDINGS Aortic arch: Mild atherosclerotic change. No aneurysm or dissection. Common origin of the innominate artery and left common carotid artery. Right carotid system: Common carotid artery is widely patent to the bifurcation. The carotid bifurcation does not show atherosclerotic change. No narrowing or irregularity. Cervical ICA is  normal. Left carotid system: Common carotid artery is widely patent to the bifurcation. The carotid bifurcation does not show atherosclerotic change. No narrowing or irregularity. Cervical ICA is normal. Vertebral arteries: Both vertebral artery origins are widely patent. Minimal atherosclerotic calcification at the left vertebral artery origin. Both vertebral arteries are widely patent through the cervical region to the foramen magnum. Skeleton: Negative Other neck: No significant soft tissue finding. Patient is intubated. Upper chest: Patchy dependent atelectasis and are infiltrate bilaterally. Possible aspiration or community acquired pneumonia. Review of the MIP images confirms the above findings CTA HEAD FINDINGS Anterior circulation: Both internal carotid arteries are widely patent through the skullbase and siphon regions. Mild peripheral siphon calcification without stenosis. Anterior and middle cerebral vessels are patent without stenosis, aneurysm or vascular malformation. No missing branch vessels are identified. Posterior circulation: Both vertebral arteries are widely patent to the basilar. Mild calcification at the foramen magnum level without stenosis. No vertebral stenosis. Posterior circulation branch vessels are normal. Venous sinuses: Patent and normal Anatomic variants: None significant Delayed phase:  No abnormal enhancement Review of the MIP images confirms the above findings IMPRESSION: CT angiography is negative for large vessel occlusion. Carotid bifurcations are normal. Only very minimal atherosclerotic change is demonstrated in the region imaged. These results were called by telephone at the time of interpretation on 11/24/2016 at 6:40 pm to Dr. Kerney Elbe , who verbally acknowledged these results. Initial code stroke head CT results also communicated at this time. Electronically Signed   By: Nelson Chimes M.D.   On: 11/24/2016 18:47   Dg Chest Portable 1 View  Result Date:  11/24/2016 CLINICAL DATA:  Status post intubation. EXAM: PORTABLE CHEST 1 VIEW COMPARISON:  06/04/2014 FINDINGS: Endotracheal tube is in place with tip approximately 3.1 cm above the carina. Nasogastric tube is in place with tip off the image beyond the gastroesophageal junction. Shallow lung inflation. Heart size is accentuated by the portable technique. There is right perihilar atelectasis. Perihilar fullness may be related to shallow lung inflation. However adenopathy cannot be excluded. IMPRESSION: 1. Status post intubation and placement of nasogastric tube. 2. Right perihilar atelectasis. 3. Perihilar fullness bilaterally and may be related to shallow inflation but adenopathy is not excluded. Follow-up chest x-rays and/or chest CT is recommended as appropriate. Electronically Signed   By: Nolon Nations M.D.   On: 11/24/2016 18:58   Ct Head Code Stroke W/o Cm  Result Date: 11/24/2016 CLINICAL DATA:  Code stroke. Unresponsive. Dialysis patient. Hyperglycemia. Code stroke. EXAM: CT HEAD WITHOUT CONTRAST TECHNIQUE: Contiguous axial images were obtained from the base of the skull through the vertex without intravenous contrast. COMPARISON:  None. FINDINGS: Brain: Moderate generalized brain atrophy. Chronic appearing small vessel ischemic changes. No sign of acute infarction, mass lesion, hemorrhage, hydrocephalus or extra-axial collection. Vascular: Atherosclerotic calcification of the major vessels at the base of the brain. No more peripheral hyperdense vessels. Skull: Negative Sinuses/Orbits: Clear Other: None ASPECTS ([place] Stroke Program Early CT Score) - Ganglionic level infarction (caudate, lentiform nuclei, internal capsule, insula, M1-M3 cortex): 7 - Supraganglionic infarction (M4-M6 cortex): 3 Total score (0-10 with 10 being normal): 10 IMPRESSION: 1. Negative acute code stroke study. Atrophy and chronic small vessel change of the white matter. 2. ASPECTS is 10 Page was placed at the time of  interpretation on 11/24/2016 at 6:20 pm. Still awaiting response. Electronically Signed   By: Nelson Chimes M.D.   On: 11/24/2016 18:23   STUDIES:    CULTURES: none  ANTIBIOTICS: none  SIGNIFICANT EVENTS:   LINES/TUBES: PIV ETT 12/29 >>  DISCUSSION: Mr. Dunklee is a 77M with PMH significant for IDDM, ESRD on PD, HTN, who presented with concern for acute stroke and had witnessed seizures in the ED. CT and CTA of the head were negative for signs of stroke. The thought is that profound hyperglycemia precipitated his seizure. He was also markedly hypertensive - it is unclear what the underlying etiology for his acute hypertensive crisis and hyperglycemia is - there is no suggestion of an infectious process at this time. Spot EEG was reportedly negative for active seizure activity, but a final report has yet to be posted.   ASSESSMENT / PLAN:  PULMONARY A: Need for intubation 2/2 mental status P:   Continue ventilatory support Wean as tolerated SBT when able  CARDIOVASCULAR A:  Hypertension - improved P:  Continue sedation/analgesia Resume home BP meds PRN labetalol for SBP > 180  RENAL A:   ESRD on peritoneal dialysis - no acute need for dialysis Elevated lactate (likely 2/2 seizure) P:  Nephrology to follow - call in AM Monitor lytes Trend lactate  GASTROINTESTINAL A:   No acute issues P:   NPO Stress ulcer ppx  HEMATOLOGIC A:   No acute issues P:  Monitor  INFECTIOUS A:   No overt signs or symptoms of infection P:   For completeness sake, check UA and blood cultures  ENDOCRINE A:   IDDM w/ marked hyperglycemia P:   Insulin gtt  NEUROLOGIC A:   Seizure, likely 2/2 hyperglycemia P:   RASS goal: 0 Wean Versed as able / per neurology If concern for persistent seizures, LTM Continue Keppra for now   FAMILY  - Updates: brother-in-law and son at bedside. Son is next of kin, but patient lives with sister, who is very involved in his care.  Consensus is that patient would wish to be FULL CODE.   - Inter-disciplinary family meet or Palliative Care meeting due by:  day 7  The patient is critically ill with multiple organ system failure and requires high complexity decision making for assessment and support, frequent evaluation and titration of therapies, advanced monitoring, review of radiographic studies and interpretation of complex data.   Critical Care Time devoted to patient care services, exclusive of separately billable procedures, described in this note is 46 minutes.   Yisroel Ramming, MD Pulmonary and [place] Pager: [phone]  11/24/2016, 10:18 PM

## 2016-11-24 NOTE — ED Notes (Signed)
Pt returned from ct

## 2016-11-24 NOTE — Progress Notes (Signed)
STAT EEG completed; results pending. Dr Nicole Kindred notified.

## 2016-11-24 NOTE — ED Notes (Signed)
Family up front pt in c-t

## 2016-11-24 NOTE — ED Notes (Signed)
Intubated by the ed res  Propofol  Started at 10mg / k   Lt a-c

## 2016-11-24 NOTE — ED Triage Notes (Signed)
The pt arrived by gems  After a code stroke was called seizure on arrival to the ed.  He c/o a headache 1700 stopped talking

## 2016-11-24 NOTE — Consult Note (Addendum)
Referring Physician: Dr. Alvino Chapel    Chief Complaint: Sudden onset headache with aphasia.   HPI: Jared [name] is an 61 y.o. male who presents to the ED via EMS after sudden onset of unresponsiveness at home. TOSO is the same as LKN: 5:00 PM. His first symptom was severe headache, unknown if lateralized. Symptoms progressed to include right sided tingling, RUE jerking, leaning to the right and aphasia. He then, per family, leaned forward in his chair without complete loss of postural tone and started "jerking all over". At the time of EMS arrival, the jerking had stopped. On arrival to the ED, he began seizing. Seizure was generalized, with eyes deviated to the right and right > left jerking movements. Seizure was aborted with 2 mg IV Ativan.   CBG was 700 on arrival. Severely hypertensive and tachycardic as well.   Brief Neurological exam performed after Ativan administration and prior to intubation (premedicated with Etomidate 20mg  IV and Rocuronium 80 mg IV, followed by propofol gtt at 10 mcg/kg/min) revealed decreased tone in all 4 extremities with slightly more movement on the right relative to the left with noxious stimuli. Pupils were pinpoint and unreactive. Neck was supple. Sonorous breathing. Unarousable with sternal rub. Eyes were closed but remained partially open after eyelids manually raised. No doll's eye reflex was present. Reflexes were hypoactive x 4 with mute plantars.   Patient does not abuse alcohol or benzodiazepines per family.   LSN: 5:00 PM tPA Given: No: Clinical presentation most consistent with seizure  Past Medical History:  Diagnosis Date  . Anemia   . Chronic kidney disease    on dialysis M-W-F  . Diabetes mellitus without complication ([place])    onset as adult  . Hypertension   . Thyroid disease    hyperparathyroidism    Past Surgical History:  Procedure Laterality Date  . AV FISTULA PLACEMENT Left 06/04/2014   Procedure: ARTERIOVENOUS (AV) FISTULA  CREATION-  BRACHIOCEPHALIC WITH LIGATION OF COMPETEING BRANCH;  Surgeon: Mal Misty, MD;  Location: [place] [place];  Service: Vascular;  Laterality: Left;  . INSERTION OF DIALYSIS CATHETER  04/2014    Family History  Problem Relation Age of Onset  . Cancer - Lung Mother   . Cancer - Other Father    Social History:  reports that he quit smoking about 32 years ago. He has never used smokeless tobacco. He reports that he drinks alcohol. He reports that he does not use drugs.  Allergies: No Known Allergies  Home Medications:  calcitRIOL (ROCALTROL) 0.5 MCG capsule Take 0.5 mcg by mouth daily. Historical Provider, MD Needs Review  cinacalcet (SENSIPAR) 30 MG tablet Take 30 mg by mouth daily. Historical Provider, MD Needs Review  hydrALAZINE (APRESOLINE) 100 MG tablet Take 100 mg by mouth 3 (three) times daily. Historical Provider, MD Needs Review  amLODipine (NORVASC) 10 MG tablet Take 10 mg by mouth daily. Historical Provider, MD Needs Review  b complex-vitamin c-folic acid (NEPHRO-VITE) 0.8 MG TABS tablet Take 1 tablet by mouth at bedtime. Historical Provider, MD Needs Review  Insulin NPH Human, Isophane, (NOVOLIN N [place]) Inject 9-20 Units into the skin 2 (two) times daily. 20 units in am and 9 units in pm. Mixs in same syringe with R. Historical Provider, MD Needs Review  insulin regular (NOVOLIN R,HUMULIN R) 100 units/mL injection Inject 4-8 Units into the skin 2 (two) times daily before a meal. 8 units in am and 4 units in pm. Mix in syringe with N Historical Provider, MD Needs  Review  metoprolol (LOPRESSOR) 100 MG tablet Take 100 mg by mouth 2 (two) times daily.  Historical Provider, MD Needs Review  oxyCODONE (ROXICODONE) 5 MG immediate release tablet Take 1 tablet (5 mg total) by mouth every 6 (six) hours as needed for severe pain. Hulen Shouts Rhyne, PA-C Needs Review  sevelamer carbonate (RENVELA) 800 MG tablet Take 800 mg by mouth 3 (three) times daily with meals. Historical Provider, MD Needs  Review     ROS: Unable to obtain due to AMS.   Physical Examination: Blood pressure 185/88, pulse 112, resp. rate 24, SpO2 100 %.  General Examination: [place]/AT Anicteric, noncyanotic Limbs warm and well-perfused  Neurologic Examination: Brief Neurological exam performed after Ativan administration and prior to intubation (premedicated with Etomidate 20mg  IV and Rocuronium 80 mg IV, followed by propofol gtt at 10 mcg/kg/min): Ment: Nonverbal, obtunded, not responding to commands. Unarousable with sternal rub. CN: Eyes were closed but remained partially open after eyelids manually raised. No doll's eye reflex was present. Pupils were pinpoint and unreactive. Neck was supple. Sonorous breathing. Motor/Sensory: Decreased tone in all 4 extremities with slightly more movement on the right relative to the left with noxious stimuli.    Reflexes: Hypoactive x 4 with mute plantars.  Cerebellar/Gait: Not assessable.   Results for orders placed or performed during the hospital encounter of 11/24/16 (from the past 48 hour(s))  I-stat chem 8, ed     Status: Abnormal   Collection Time: 11/24/16  6:04 PM  Result Value Ref Range   Sodium 135 135 - 145 mmol/L   Potassium 4.5 3.5 - 5.1 mmol/L   Chloride 102 101 - 111 mmol/L   BUN 54 (H) 6 - 20 mg/dL   Creatinine, Ser 11.10 (H) 0.61 - 1.24 mg/dL   Glucose, Bld >700 (HH) 65 - 99 mg/dL   Calcium, Ion 1.11 (L) 1.15 - 1.40 mmol/L   TCO2 17 0 - 100 mmol/L   Hemoglobin 15.6 13.0 - 17.0 g/dL   HCT 46.0 39.0 - 52.0 %   Comment NOTIFIED PHYSICIAN    No results found.  Assessment: 60 y.o. male with acute onset of unresponsiveness followed by focal seizure activity with subsequent generalization 1. Overall clinical picture most consistent with status epilepticus. Generalized tremulousness occurred after CT and CTA were completed, in the context of recent administration of paralytic and sedation for intubation. Tremulousness suggestive of continuing  generalized seizure activity.  2. Severely elevated blood sugar of 700. Most likely the precipitant of his seizure.  3. CT was negative for acute findings.  4. CTA with no LVO.   Plan: 1. Initially sedated with propofol. This has been switched to Versed (15 mg bolus followed by 15 mg/hr infusion).  2. Loaded with Keppra 1000 mg IV. 3. Continue Keppra at 1000 mg IV BID, next dose in 12 hours.  4. STAT EEG has been ordered and tech called.  5. If EEG shows electrographic seizure activity, administer Ativan and load a second anticonvulsant (valproic acid or Dilantin are recommended). If seizures continue, administer second bolus of Versed and increase Versed infusion rate towards goal of burst suppression. 6. Blood sugar and electrolyte management.  7. BP management.  8. Seizure precautions.   40 minutes spent in the evaluation and management of this critically ill patient.   @Electronically  signed: Dr. Kerney Elbe 11/24/2016, 6:12 PM

## 2016-11-24 NOTE — Procedures (Signed)
ELECTROENCEPHALOGRAM REPORT  Patient: Jared [name]       Room #: 2Z36 EEG No. ID: [id number] Age: 60 y.o.        Sex: male Referring Physician: Lamonte Sakai, R Report Date:  11/24/2016        Interpreting Physician: Anthony Sar  History: Cyan Moultrie is an 60 y.o. male with a history of diabetes mellitus and chronic kidney disease brought to the ED following a change in mental apparent weakness on the right side. Patient exhibited generalized seizure activity after arriving in the ED, which abated following administration of IV Ativan. He was subsequently intubated and started on IV Versed drip.  Indications for study:  Rule out subclinical seizure  Activity.  Technique: This is an 18 channel routine scalp EEG performed at the bedside with bipolar and monopolar montages arranged in accordance to the international 10/20 system of electrode placement.   Description: patient was intubated and on mechanical ventilation at the time of this study, as well as sedated with IV Versed. Predominant activity consisted of low amplitude diffuse symmetrical delta activity with superimposed ow amplitude beta activity. Photic stimulation was not performed. No epileptiform discharges were recorded. There was no disproportionate focal slowing of cerebral activity.  Interpretation: This EEG was abnormal with mild generalized nonspecific continuous slowing of cerebral activity which was likely, at least in part, due to sedating medication. No seizure activity was recorded.   Rush Farmer M.D. [facility] [phone]

## 2016-11-24 NOTE — ED Notes (Addendum)
Pt is on peritoneal dialysis

## 2016-11-24 NOTE — ED Notes (Signed)
Etomidate 20mg  iv melanie  rn   Rocuronium 80 mg iv per [employer]

## 2016-11-24 NOTE — Progress Notes (Signed)
Pt. having EEG set up/done upon my arrival, RT to monitor.

## 2016-11-24 NOTE — ED Provider Notes (Signed)
[place] DEPT Provider Note   CSN: [id number] Arrival date & time: 11/24/16  1746     History   Chief Complaint Chief Complaint  Patient presents with  . Code Stroke    HPI Jared [name] is a 60 y.o. male.  The history is provided by the EMS personnel.  Cerebrovascular Accident  This is a new problem. The current episode started less than 1 hour ago (LKN around 1700). The problem occurs constantly. The problem has been rapidly worsening. Associated symptoms include headaches. Associated symptoms comments: R arm tingling, L facial droop, R arm weakness. .  Seizures   This is a new problem. Episode onset: shortly PTA. The problem has not changed since onset.There was 1 seizure. Associated symptoms include headaches. Characteristics include eye deviation, rhythmic jerking and loss of consciousness. The episode was witnessed. The seizures continued in the ED. The seizure(s) had no focality. There has been no fever. There were no medications administered prior to arrival.  -Upon arrival patient started having a generalized tonic-clonic seizure. Patient is no history of seizures. Patient reportedly had blood pressure 280 at home. Pt is a PD patient.  Past Medical History:  Diagnosis Date  . Anemia   . Chronic kidney disease    on dialysis M-W-F  . Diabetes mellitus without complication ([place])    onset as adult  . Hypertension   . Thyroid disease    hyperparathyroidism    There are no active problems to display for this patient.   Past Surgical History:  Procedure Laterality Date  . AV FISTULA PLACEMENT Left 06/04/2014   Procedure: ARTERIOVENOUS (AV) FISTULA CREATION-  BRACHIOCEPHALIC WITH LIGATION OF COMPETEING BRANCH;  Surgeon: Mal Misty, MD;  Location: [facility];  Service: Vascular;  Laterality: Left;  . INSERTION OF DIALYSIS CATHETER  04/2014       Home Medications    Prior to Admission medications   Medication Sig Start Date End Date Taking? Authorizing  Provider  amLODipine (NORVASC) 10 MG tablet Take 10 mg by mouth daily.    Historical Provider, MD  b complex-vitamin c-folic acid (NEPHRO-VITE) 0.8 MG TABS tablet Take 1 tablet by mouth at bedtime.    Historical Provider, MD  calcitRIOL (ROCALTROL) 0.5 MCG capsule Take 0.5 mcg by mouth daily.    Historical Provider, MD  cinacalcet (SENSIPAR) 30 MG tablet Take 30 mg by mouth daily.    Historical Provider, MD  hydrALAZINE (APRESOLINE) 100 MG tablet Take 100 mg by mouth 3 (three) times daily.    Historical Provider, MD  Insulin NPH Human, Isophane, (NOVOLIN N [place]) Inject 4-19 Units into the skin 2 (two) times daily.     Historical Provider, MD  insulin regular (NOVOLIN R,HUMULIN R) 100 units/mL injection Inject 2-6 Units into the skin 2 (two) times daily before a meal.     Historical Provider, MD  metoprolol (LOPRESSOR) 100 MG tablet Take 100 mg by mouth 2 (two) times daily.     Historical Provider, MD  oxyCODONE (ROXICODONE) 5 MG immediate release tablet Take 1 tablet (5 mg total) by mouth every 6 (six) hours as needed for severe pain. 06/04/14   Samantha J Rhyne, PA-C  sevelamer carbonate (RENVELA) 800 MG tablet Take 800 mg by mouth 3 (three) times daily with meals.    Historical Provider, MD    Family History Family History  Problem Relation Age of Onset  . Cancer - Lung Mother   . Cancer - Other Father     Social History Social  History  Substance Use Topics  . Smoking status: Former Smoker    Quit date: 05/11/1984  . Smokeless tobacco: Never Used  . Alcohol use Yes     Comment: occasional     Allergies   Penicillins   Review of Systems Review of Systems  Unable to perform ROS: Acuity of condition  Neurological: Positive for seizures, loss of consciousness, weakness, numbness and headaches.     Physical Exam Updated Vital Signs BP (!) 218/123   Pulse (!) 124   Temp 97.6 F (36.4 C) (Rectal)   Resp 21   Ht 5\' 10"  (1.778 m)   Wt 89.7 kg   SpO2 99%   BMI 28.37 kg/m    Physical Exam  Constitutional: He appears well-developed and well-nourished. He appears distressed (active seizure).  HENT:  Head: Normocephalic and atraumatic.  Mouth/Throat: Oropharynx is clear and moist.  Eyes: Conjunctivae are normal. Pupils are equal, round, and reactive to light.  Eyes deviated to R during seizure  Neck: Neck supple.  Cardiovascular: Normal rate, regular rhythm and intact distal pulses.   No murmur heard. Pulmonary/Chest: Effort normal and breath sounds normal. No respiratory distress. He has no wheezes. He has no rales.  Abdominal: Soft. There is no tenderness.  Musculoskeletal: He exhibits no edema.  Neurological: He is unresponsive. He displays seizure activity. GCS eye subscore is 1. GCS verbal subscore is 1. GCS motor subscore is 1.  On arrival patient having active tonic clonic seizures with head and eye deviated to the right. After the seizure patient was not moving any extremities and had no response to painful stimuli.  Skin: Skin is warm and dry.  Psychiatric: He has a normal mood and affect.  Nursing note and vitals reviewed.    ED Treatments / Results  Labs (all labs ordered are listed, but only abnormal results are displayed) Labs Reviewed  CBC - Abnormal; Notable for the following:       Result Value   RDW 16.3 (*)    All other components within normal limits  COMPREHENSIVE METABOLIC PANEL - Abnormal; Notable for the following:    Chloride 98 (*)    CO2 12 (*)    Glucose, Bld 739 (*)    BUN 55 (*)    Creatinine, Ser 11.43 (*)    Albumin 3.1 (*)    GFR calc non Af Amer 4 (*)    GFR calc Af Amer 5 (*)    Anion gap 25 (*)    All other components within normal limits  I-STAT CHEM 8, ED - Abnormal; Notable for the following:    BUN 54 (*)    Creatinine, Ser 11.10 (*)    Glucose, Bld >700 (*)    Calcium, Ion 1.11 (*)    All other components within normal limits  I-STAT CG4 LACTIC ACID, ED - Abnormal; Notable for the following:     Lactic Acid, Venous 12.94 (*)    All other components within normal limits  CBG MONITORING, ED - Abnormal; Notable for the following:    Glucose-Capillary >600 (*)    All other components within normal limits  ETHANOL  PROTIME-INR  APTT  DIFFERENTIAL  RAPID URINE DRUG SCREEN, HOSP PERFORMED  URINALYSIS, ROUTINE W REFLEX MICROSCOPIC  I-STAT TROPOININ, ED    EKG  EKG Interpretation None       Radiology Ct Angio Head W Or Wo Contrast  Result Date: 11/24/2016 CLINICAL DATA:  Dialysis patient. Diabetes. Unresponsive. Negative acute head CT. EXAM:  CT ANGIOGRAPHY HEAD AND NECK TECHNIQUE: Multidetector CT imaging of the head and neck was performed using the standard protocol during bolus administration of intravenous contrast. Multiplanar CT image reconstructions and MIPs were obtained to evaluate the vascular anatomy. Carotid stenosis measurements (when applicable) are obtained utilizing NASCET criteria, using the distal internal carotid diameter as the denominator. CONTRAST:  50 cc Isovue 370 COMPARISON:  Earlier same day CT FINDINGS: CTA NECK FINDINGS Aortic arch: Mild atherosclerotic change. No aneurysm or dissection. Common origin of the innominate artery and left common carotid artery. Right carotid system: Common carotid artery is widely patent to the bifurcation. The carotid bifurcation does not show atherosclerotic change. No narrowing or irregularity. Cervical ICA is normal. Left carotid system: Common carotid artery is widely patent to the bifurcation. The carotid bifurcation does not show atherosclerotic change. No narrowing or irregularity. Cervical ICA is normal. Vertebral arteries: Both vertebral artery origins are widely patent. Minimal atherosclerotic calcification at the left vertebral artery origin. Both vertebral arteries are widely patent through the cervical region to the foramen magnum. Skeleton: Negative Other neck: No significant soft tissue finding. Patient is intubated.  Upper chest: Patchy dependent atelectasis and are infiltrate bilaterally. Possible aspiration or community acquired pneumonia. Review of the MIP images confirms the above findings CTA HEAD FINDINGS Anterior circulation: Both internal carotid arteries are widely patent through the skullbase and siphon regions. Mild peripheral siphon calcification without stenosis. Anterior and middle cerebral vessels are patent without stenosis, aneurysm or vascular malformation. No missing branch vessels are identified. Posterior circulation: Both vertebral arteries are widely patent to the basilar. Mild calcification at the foramen magnum level without stenosis. No vertebral stenosis. Posterior circulation branch vessels are normal. Venous sinuses: Patent and normal Anatomic variants: None significant Delayed phase: No abnormal enhancement Review of the MIP images confirms the above findings IMPRESSION: CT angiography is negative for large vessel occlusion. Carotid bifurcations are normal. Only very minimal atherosclerotic change is demonstrated in the region imaged. These results were called by telephone at the time of interpretation on 11/24/2016 at 6:40 pm to Dr. Kerney Elbe , who verbally acknowledged these results. Initial code stroke head CT results also communicated at this time. Electronically Signed   By: Nelson Chimes M.D.   On: 11/24/2016 18:47   Ct Angio Neck W Or Wo Contrast  Result Date: 11/24/2016 CLINICAL DATA:  Dialysis patient. Diabetes. Unresponsive. Negative acute head CT. EXAM: CT ANGIOGRAPHY HEAD AND NECK TECHNIQUE: Multidetector CT imaging of the head and neck was performed using the standard protocol during bolus administration of intravenous contrast. Multiplanar CT image reconstructions and MIPs were obtained to evaluate the vascular anatomy. Carotid stenosis measurements (when applicable) are obtained utilizing NASCET criteria, using the distal internal carotid diameter as the denominator. CONTRAST:   50 cc Isovue 370 COMPARISON:  Earlier same day CT FINDINGS: CTA NECK FINDINGS Aortic arch: Mild atherosclerotic change. No aneurysm or dissection. Common origin of the innominate artery and left common carotid artery. Right carotid system: Common carotid artery is widely patent to the bifurcation. The carotid bifurcation does not show atherosclerotic change. No narrowing or irregularity. Cervical ICA is normal. Left carotid system: Common carotid artery is widely patent to the bifurcation. The carotid bifurcation does not show atherosclerotic change. No narrowing or irregularity. Cervical ICA is normal. Vertebral arteries: Both vertebral artery origins are widely patent. Minimal atherosclerotic calcification at the left vertebral artery origin. Both vertebral arteries are widely patent through the cervical region to the foramen magnum. Skeleton: Negative Other  neck: No significant soft tissue finding. Patient is intubated. Upper chest: Patchy dependent atelectasis and are infiltrate bilaterally. Possible aspiration or community acquired pneumonia. Review of the MIP images confirms the above findings CTA HEAD FINDINGS Anterior circulation: Both internal carotid arteries are widely patent through the skullbase and siphon regions. Mild peripheral siphon calcification without stenosis. Anterior and middle cerebral vessels are patent without stenosis, aneurysm or vascular malformation. No missing branch vessels are identified. Posterior circulation: Both vertebral arteries are widely patent to the basilar. Mild calcification at the foramen magnum level without stenosis. No vertebral stenosis. Posterior circulation branch vessels are normal. Venous sinuses: Patent and normal Anatomic variants: None significant Delayed phase: No abnormal enhancement Review of the MIP images confirms the above findings IMPRESSION: CT angiography is negative for large vessel occlusion. Carotid bifurcations are normal. Only very minimal  atherosclerotic change is demonstrated in the region imaged. These results were called by telephone at the time of interpretation on 11/24/2016 at 6:40 pm to Dr. Kerney Elbe , who verbally acknowledged these results. Initial code stroke head CT results also communicated at this time. Electronically Signed   By: Nelson Chimes M.D.   On: 11/24/2016 18:47   Dg Chest Portable 1 View  Result Date: 11/24/2016 CLINICAL DATA:  Status post intubation. EXAM: PORTABLE CHEST 1 VIEW COMPARISON:  06/04/2014 FINDINGS: Endotracheal tube is in place with tip approximately 3.1 cm above the carina. Nasogastric tube is in place with tip off the image beyond the gastroesophageal junction. Shallow lung inflation. Heart size is accentuated by the portable technique. There is right perihilar atelectasis. Perihilar fullness may be related to shallow lung inflation. However adenopathy cannot be excluded. IMPRESSION: 1. Status post intubation and placement of nasogastric tube. 2. Right perihilar atelectasis. 3. Perihilar fullness bilaterally and may be related to shallow inflation but adenopathy is not excluded. Follow-up chest x-rays and/or chest CT is recommended as appropriate. Electronically Signed   By: Nolon Nations M.D.   On: 11/24/2016 18:58   Ct Head Code Stroke W/o Cm  Result Date: 11/24/2016 CLINICAL DATA:  Code stroke. Unresponsive. Dialysis patient. Hyperglycemia. Code stroke. EXAM: CT HEAD WITHOUT CONTRAST TECHNIQUE: Contiguous axial images were obtained from the base of the skull through the vertex without intravenous contrast. COMPARISON:  None. FINDINGS: Brain: Moderate generalized brain atrophy. Chronic appearing small vessel ischemic changes. No sign of acute infarction, mass lesion, hemorrhage, hydrocephalus or extra-axial collection. Vascular: Atherosclerotic calcification of the major vessels at the base of the brain. No more peripheral hyperdense vessels. Skull: Negative Sinuses/Orbits: Clear Other: None  ASPECTS ([place] Stroke Program Early CT Score) - Ganglionic level infarction (caudate, lentiform nuclei, internal capsule, insula, M1-M3 cortex): 7 - Supraganglionic infarction (M4-M6 cortex): 3 Total score (0-10 with 10 being normal): 10 IMPRESSION: 1. Negative acute code stroke study. Atrophy and chronic small vessel change of the white matter. 2. ASPECTS is 10 Page was placed at the time of interpretation on 11/24/2016 at 6:20 pm. Still awaiting response. Electronically Signed   By: Nelson Chimes M.D.   On: 11/24/2016 18:23    Procedures Procedure Name: Intubation Date/Time: 11/24/2016 6:34 PM Performed by: Tobie Poet Pre-anesthesia Checklist: Patient identified, Emergency Drugs available and Timeout performed Oxygen Delivery Method: Non-rebreather mask Preoxygenation: Pre-oxygenation with 100% oxygen Intubation Type: Rapid sequence Laryngoscope Size: Mac and 3 Grade View: Grade I Tube size: 7.5 mm Number of attempts: 1 Airway Equipment and Method: Rigid stylet and Video-laryngoscopy Placement Confirmation: ETT inserted through vocal cords under direct vision,  CO2  detector and Breath sounds checked- equal and bilateral Secured at: 23 cm Tube secured with: ETT holder      (including critical care time)  Medications Ordered in ED Medications  propofol (DIPRIVAN) 1000 MG/100ML infusion (not administered)  fentaNYL (SUBLIMAZE) injection 50 mcg (not administered)  fentaNYL (SUBLIMAZE) 2,500 mcg in sodium chloride 0.9 % 250 mL (10 mcg/mL) infusion (50 mcg/hr Intravenous New Bag/Given 11/24/16 1902)  fentaNYL (SUBLIMAZE) bolus via infusion 50 mcg (not administered)  midazolam (VERSED) bolus via infusion 1-2 mg (not administered)  midazolam (VERSED) 2 MG/2ML injection (not administered)  midazolam (VERSED) 50 mg in sodium chloride 0.9 % 50 mL (1 mg/mL) infusion (15 mg/hr Intravenous New Bag/Given 11/24/16 1906)  dextrose 5 %-0.45 % sodium chloride infusion (not administered)  insulin  regular (NOVOLIN R,HUMULIN R) 250 Units in sodium chloride 0.9 % 250 mL (1 Units/mL) infusion (not administered)  sodium chloride 0.9 % bolus 1,000 mL (not administered)    And  0.9 %  sodium chloride infusion (not administered)  etomidate (AMIDATE) injection 20 mg (20 mg Intravenous Given 11/24/16 1951)  iopamidol (ISOVUE-370) 76 % injection (50 mLs Intravenous Contrast Given 11/24/16 1818)  levETIRAcetam (KEPPRA) 1,000 mg in sodium chloride 0.9 % 100 mL IVPB (0 mg Intravenous Stopped 11/24/16 1907)  midazolam (VERSED) injection 15 mg (15 mg Intravenous Given 11/24/16 1841)     Initial Impression / Assessment and Plan / ED Course  I have reviewed the triage vital signs and the nursing notes.  Pertinent labs & imaging results that were available during my care of the patient were reviewed by me and considered in my medical decision making (see chart for details).  Clinical Course    Patient is a 60 year old male who presents as a code stroke after being noted to have left facial droop, right arm tingling and weakness all with a headache. Patient became unresponsive and started seizing shortly prior to arrival. Patient noted to have tonic-clonic seizures upon arrival into the ED. No meds have been given. Blood pressure as high as 250 systolic prior to arrival. Patient is a peritoneal dialysis patient. No history of seizures. Glucose reportedly 88 prior to arrival however i-STAT labs showed glucose over 700 here. Does not appear to be in DKA.   Seizure activity appeared to stop spontaneously shortly after arrival. Ativan 2 mg given. Patient is GCS 3 and not protecting his airway. Patient's reflux. patient is intubated for low gcs and airway protection. procedure noted above. patient is taken emergently to the ct scanner and has a ct head and cta head/neck shows no acute findings. patient is started on insulin drip for hyperglycemia.  patient will be admitted to the icu for further management with  neurology following. patient will need eeg monitoring for concern of status epilepticus. patient is loaded with keppra in the ed. propofol drip and switched over to versed drip per recommendation of neurology. Doubt acute CVA at this time.   Pt seen with attending Dr. Alvino Chapel.     Final Clinical Impressions(s) / ED Diagnoses   Final diagnoses:  Status epilepticus [facility])    New Prescriptions New Prescriptions   No medications on file     Tobie Poet, DO 11/24/16 2038    Davonna Belling, MD 11/25/16 0002

## 2016-11-24 NOTE — ED Notes (Signed)
Ativan 2mg  given iv on arrival to the room for seizure activity

## 2016-11-24 NOTE — ED Notes (Signed)
Jared [name] (NT) obtained CBG. CBG read as "HI," and greater than 600. [employer], corporate) notified.

## 2016-11-24 NOTE — ED Notes (Signed)
To ct

## 2016-11-24 NOTE — Progress Notes (Signed)
Code stroke called at 1, Pateint arrived to [facility] ED via [facility] at 1746.  Patient was seizing on arrival, recieved 2 mg IV ativan and brought to room and intubated by EDP.   As per EMS was with family, lsn 1700 C/O "lots of noise in head" , EMS stated he was leaning to right with gaze preference and aphasic.   NIHSS 26.

## 2016-11-25 DIAGNOSIS — G40901 Epilepsy, unspecified, not intractable, with status epilepticus: Secondary | ICD-10-CM

## 2016-11-25 DIAGNOSIS — J9601 Acute respiratory failure with hypoxia: Secondary | ICD-10-CM

## 2016-11-25 LAB — BASIC METABOLIC PANEL
ANION GAP: 13 (ref 5–15)
BUN: 63 mg/dL — ABNORMAL HIGH (ref 6–20)
CALCIUM: 8.5 mg/dL — AB (ref 8.9–10.3)
CO2: 24 mmol/L (ref 22–32)
Chloride: 101 mmol/L (ref 101–111)
Creatinine, Ser: 11.3 mg/dL — ABNORMAL HIGH (ref 0.61–1.24)
GFR, EST AFRICAN AMERICAN: 5 mL/min — AB (ref >60)
GFR, EST NON AFRICAN AMERICAN: 4 mL/min — AB (ref >60)
GLUCOSE: 150 mg/dL — AB (ref 65–99)
POTASSIUM: 3.4 mmol/L — AB (ref 3.5–5.1)
Sodium: 138 mmol/L (ref 135–145)

## 2016-11-25 LAB — BLOOD GAS, ARTERIAL
ACID-BASE EXCESS: 0.6 mmol/L (ref 0.0–2.0)
BICARBONATE: 24.5 mmol/L (ref 20.0–28.0)
DRAWN BY: 23588
FIO2: 80
O2 SAT: 97.5 %
PATIENT TEMPERATURE: 98.6
PCO2 ART: 38 mmHg (ref 32.0–48.0)
PEEP: 5 cmH2O
PH ART: 7.424 (ref 7.350–7.450)
PO2 ART: 105 mmHg (ref 83.0–108.0)
RATE: 15 resp/min
VT: 580 mL

## 2016-11-25 LAB — CBC
HEMATOCRIT: 36.4 % — AB (ref 39.0–52.0)
Hemoglobin: 11.5 g/dL — ABNORMAL LOW (ref 13.0–17.0)
MCH: 26.7 pg (ref 26.0–34.0)
MCHC: 31.6 g/dL (ref 30.0–36.0)
MCV: 84.5 fL (ref 78.0–100.0)
PLATELETS: 188 10*3/uL (ref 150–400)
RBC: 4.31 MIL/uL (ref 4.22–5.81)
RDW: 16.6 % — AB (ref 11.5–15.5)
WBC: 8.8 10*3/uL (ref 4.0–10.5)

## 2016-11-25 LAB — MRSA PCR SCREENING: MRSA BY PCR: NEGATIVE

## 2016-11-25 LAB — GLUCOSE, CAPILLARY
GLUCOSE-CAPILLARY: 208 mg/dL — AB (ref 65–99)
GLUCOSE-CAPILLARY: 152 mg/dL — AB (ref 65–99)
GLUCOSE-CAPILLARY: 117 mg/dL — AB (ref 65–99)
GLUCOSE-CAPILLARY: 67 mg/dL (ref 65–99)
GLUCOSE-CAPILLARY: 79 mg/dL (ref 65–99)
GLUCOSE-CAPILLARY: 144 mg/dL — AB (ref 65–99)
GLUCOSE-CAPILLARY: 198 mg/dL — AB (ref 65–99)
GLUCOSE-CAPILLARY: 171 mg/dL — AB (ref 65–99)
Glucose-Capillary: 559 mg/dL (ref 65–99)
Glucose-Capillary: 513 mg/dL (ref 65–99)
Glucose-Capillary: 389 mg/dL — ABNORMAL HIGH (ref 65–99)
Glucose-Capillary: 314 mg/dL — ABNORMAL HIGH (ref 65–99)
Glucose-Capillary: 244 mg/dL — ABNORMAL HIGH (ref 65–99)
Glucose-Capillary: 105 mg/dL — ABNORMAL HIGH (ref 65–99)
Glucose-Capillary: 196 mg/dL — ABNORMAL HIGH (ref 65–99)
Glucose-Capillary: 84 mg/dL (ref 65–99)
Glucose-Capillary: 94 mg/dL (ref 65–99)

## 2016-11-25 LAB — PHOSPHORUS
PHOSPHORUS: 3.5 mg/dL (ref 2.5–4.6)
PHOSPHORUS: 4.5 mg/dL (ref 2.5–4.6)
Phosphorus: 6.6 mg/dL — ABNORMAL HIGH (ref 2.5–4.6)

## 2016-11-25 LAB — MAGNESIUM
MAGNESIUM: 1.9 mg/dL (ref 1.7–2.4)
Magnesium: 1.9 mg/dL (ref 1.7–2.4)
Magnesium: 1.8 mg/dL (ref 1.7–2.4)

## 2016-11-25 MED ORDER — INSULIN ASPART 100 UNIT/ML ~~LOC~~ SOLN
1.0000 [IU] | SUBCUTANEOUS | Status: DC
Start: 2016-11-25 — End: 2016-11-26
  Administered 2016-11-25 – 2016-11-26 (×4): 1 [IU] via SUBCUTANEOUS

## 2016-11-25 MED ORDER — VITAL AF 1.2 CAL PO LIQD
1000.0000 mL | ORAL | Status: DC
  Administered 2016-11-25 – 2016-11-27 (×3): 1000 mL

## 2016-11-25 MED ORDER — ORAL CARE MOUTH RINSE
15.0000 mL | OROMUCOSAL | Status: DC
  Administered 2016-11-25 – 2016-11-27 (×23): 15 mL via OROMUCOSAL

## 2016-11-25 MED ORDER — FAMOTIDINE 20 MG PO TABS
20.0000 mg | ORAL_TABLET | Freq: Every day | ORAL | Status: DC
  Administered 2016-11-25 – 2016-11-26 (×2): 20 mg via ORAL
  Filled 2016-11-25 (×2): qty 1

## 2016-11-25 MED ORDER — DELFLEX-LC/1.5% DEXTROSE 344 MOSM/L IP SOLN: 2500.0000 mL | INTRAPERITONEAL | Status: DC

## 2016-11-25 MED ORDER — HYDRALAZINE HCL 50 MG PO TABS
50.0000 mg | ORAL_TABLET | Freq: Three times a day (TID) | ORAL | Status: DC
  Administered 2016-11-25 – 2016-11-27 (×4): 50 mg
  Filled 2016-11-25 (×4): qty 1

## 2016-11-25 MED ORDER — DEXTROSE 50 % IV SOLN
INTRAVENOUS | Status: AC
Start: 2016-11-25 — End: 2016-11-25
  Filled 2016-11-25: qty 50

## 2016-11-25 MED ORDER — CHLORHEXIDINE GLUCONATE 0.12% ORAL RINSE (MEDLINE KIT)
15.0000 mL | Freq: Two times a day (BID) | OROMUCOSAL | Status: DC
  Administered 2016-11-25 – 2016-11-27 (×6): 15 mL via OROMUCOSAL

## 2016-11-25 MED ORDER — INSULIN ASPART 100 UNIT/ML ~~LOC~~ SOLN
1.0000 [IU] | SUBCUTANEOUS | Status: DC
  Administered 2016-11-25: 3 [IU] via SUBCUTANEOUS
  Administered 2016-11-25: 2 [IU] via SUBCUTANEOUS

## 2016-11-25 MED ORDER — GENTAMICIN SULFATE 0.1 % EX CREA: 1.0000 "application " | TOPICAL_CREAM | Freq: Every day | CUTANEOUS | Status: DC

## 2016-11-25 MED ORDER — INSULIN GLARGINE 100 UNIT/ML ~~LOC~~ SOLN
10.0000 [IU] | Freq: Every day | SUBCUTANEOUS | Status: DC
  Administered 2016-11-25: 10 [IU] via SUBCUTANEOUS
  Filled 2016-11-25: qty 0.1

## 2016-11-25 MED ORDER — VITAL HIGH PROTEIN PO LIQD: 1000.0000 mL | ORAL | Status: DC

## 2016-11-25 MED ORDER — DELFLEX-LC/1.5% DEXTROSE 344 MOSM/L IP SOLN
INTRAPERITONEAL | Status: DC
  Administered 2016-11-26: 10000 mL via INTRAPERITONEAL

## 2016-11-25 MED ORDER — GENTAMICIN SULFATE 0.1 % EX CREA
1.0000 "application " | TOPICAL_CREAM | Freq: Every day | CUTANEOUS | Status: DC
  Administered 2016-11-25: 1 via TOPICAL
  Filled 2016-11-25: qty 15

## 2016-11-25 MED ORDER — FENTANYL 2500MCG IN NS 250ML (10MCG/ML) PREMIX INFUSION: 25.0000 ug/h | INTRAVENOUS | Status: DC

## 2016-11-25 MED ORDER — POTASSIUM CHLORIDE 20 MEQ/15ML (10%) PO SOLN
20.0000 meq | Freq: Once | ORAL | Status: AC
  Administered 2016-11-25: 20 meq
  Filled 2016-11-25: qty 15

## 2016-11-25 MED ORDER — PRO-STAT SUGAR FREE PO LIQD
30.0000 mL | Freq: Two times a day (BID) | ORAL | Status: DC
  Filled 2016-11-25: qty 30

## 2016-11-25 MED ORDER — DELFLEX-LC/2.5% DEXTROSE 394 MOSM/L IP SOLN
INTRAPERITONEAL | Status: DC
  Administered 2016-11-26: 5000 mL via INTRAPERITONEAL

## 2016-11-25 MED ORDER — HEPARIN 1000 UNIT/ML FOR PERITONEAL DIALYSIS
2500.0000 [IU] | INTRAMUSCULAR | Status: DC | PRN
  Filled 2016-11-25 (×3): qty 2.5

## 2016-11-25 MED ORDER — DEXTROSE 50 % IV SOLN
13.0000 mL | Freq: Once | INTRAVENOUS | Status: AC
  Administered 2016-11-25: 13 mL via INTRAVENOUS

## 2016-11-25 MED ORDER — MIDAZOLAM BOLUS VIA INFUSION
2.0000 mg | INTRAVENOUS | Status: DC | PRN
  Filled 2016-11-25: qty 2

## 2016-11-25 MED ORDER — INSULIN ASPART 100 UNIT/ML ~~LOC~~ SOLN: 1.0000 [IU] | SUBCUTANEOUS | Status: DC

## 2016-11-25 NOTE — Progress Notes (Signed)
S/O: Undergoing peritoneal dialysis.   BP 135/79   Pulse 85   Temp 98.5 F (36.9 C) (Axillary)   Resp 15   Ht 5\' 10"  (1.778 m)   Wt 82.9 kg (182 lb 12.2 oz)   SpO2 100%   BMI 26.22 kg/m    Opens eyes and arouses to stuporous state with light stimuli. No clinical seizure activity seen.   A/R: 1. Hold Versed gtt. Keep Versed line available in case he has seizure recurremce. 2. If seizure occurs, bolus 2 mg Versed IV and call Neurology.  3. Neurology team will reevaluate in the AM, or sooner if there is deterioration.   Electronically signed: Dr. Kerney Elbe

## 2016-11-25 NOTE — Progress Notes (Signed)
Pt. Transported to 2SP19 uneventfully from ED Trauma-A.

## 2016-11-25 NOTE — Progress Notes (Signed)
Initial Nutrition Assessment   INTERVENTION:  Initiate TF via OGT with Vital AF 1.2 at goal rate of 65 ml/h (1560 ml per day) to provide 1872 kcals, 117 gm protein, 1264 ml free water daily.   NUTRITION DIAGNOSIS:   Inadequate oral intake related to inability to eat as evidenced by NPO status.   GOAL:   Patient will meet greater than or equal to 90% of their needs   MONITOR:   TF tolerance, Skin, I & O's, Labs, Vent status, Weight trends  REASON FOR ASSESSMENT:   Ventilator, Consult Enteral/tube feeding initiation and management  ASSESSMENT:    53M with PMH significant for IDDM, ESRD on PD, HTN, who presented with concern for acute stroke and had witnessed seizures in the ED. CT and CTA of the head were negative for signs of stroke. The thought is that profound hyperglycemia vs hypertensive encephalopathy precipitated his seizure.   Patient is currently intubated on ventilator support MV: 8.5 L/min Temp (24hrs), Avg:98.7 F (37.1 C), Min:97.6 F (36.4 C), Max:99.8 F (37.7 C)  Propofol: none  Labs: high BUN, low calcium, low GFR, low hemoglobin   Diet Order:  Diet NPO time specified  Skin:  Reviewed, no issues  Last BM:     Height:   Ht Readings from Last 1 Encounters:  11/24/16 5\' 10"  (1.778 m)    Weight:   Wt Readings from Last 1 Encounters:  11/25/16 182 lb 12.2 oz (82.9 kg)    Ideal Body Weight:  75.45 kg  BMI:  Body mass index is 26.22 kg/m.  Estimated Nutritional Needs:   Kcal:  1921  Protein:  100-125 grams  Fluid:  per MD  EDUCATION NEEDS:   No education needs identified at this time  Scarlette Ar RD, CSP, LDN Inpatient Clinical Dietitian Pager: [phone] After Hours Pager: [phone]

## 2016-11-25 NOTE — Progress Notes (Signed)
Subjective: No clinical seizure activity. On Versed 15 mg /hr. Blood sugars now better controlled with insulin drip switched to subcutaneous.   Objective: Current vital signs: BP 124/77   Pulse 89   Temp 98.6 F (37 C) (Oral)   Resp 15   Ht '5\' 10"'$  (1.778 m)   Wt 82.9 kg (182 lb 12.2 oz)   SpO2 100%   BMI 26.22 kg/m  Vital signs in last 24 hours: Temp:  [97.6 F (36.4 C)-99.8 F (37.7 C)] 98.6 F (37 C) (12/30 1200) Pulse Rate:  [79-126] 89 (12/30 1500) Resp:  [15-30] 15 (12/30 1500) BP: (115-223)/(69-123) 124/77 (12/30 1500) SpO2:  [90 %-100 %] 100 % (12/30 1500) FiO2 (%):  [40 %-70 %] 40 % (12/30 1400) Weight:  [82.9 kg (182 lb 12.2 oz)-89.7 kg (197 lb 12 oz)] 82.9 kg (182 lb 12.2 oz) (12/30 0437)  Intake/Output from previous day: 12/29 0701 - 12/30 0700 In: 755 [I.V.:655; IV Piggyback:100] Out: -  Intake/Output this shift: Total I/O In: 386.8 [I.V.:162.5; NG/GT:224.3] Out: 335 [Urine:335] Nutritional status: Diet NPO time specified  Neurologic Exam: Exam on Versed gtt consistent with deep sedation with no pupillary light reflexes, no arousal to voice and no withdrawal to noxious stimuli.   Lab Results: Results for orders placed or performed during the hospital encounter of 11/24/16 (from the past 48 hour(s))  Ethanol     Status: None   Collection Time: 11/24/16  5:58 PM  Result Value Ref Range   Alcohol, Ethyl (B) <5 <5 mg/dL    Comment:        LOWEST DETECTABLE LIMIT FOR SERUM ALCOHOL IS 5 mg/dL FOR MEDICAL PURPOSES ONLY   Protime-INR     Status: None   Collection Time: 11/24/16  5:58 PM  Result Value Ref Range   Prothrombin Time 15.0 11.4 - 15.2 seconds   INR 1.17   APTT     Status: None   Collection Time: 11/24/16  5:58 PM  Result Value Ref Range   aPTT 32 24 - 36 seconds  CBC     Status: Abnormal   Collection Time: 11/24/16  5:58 PM  Result Value Ref Range   WBC 6.7 4.0 - 10.5 K/uL   RBC 4.95 4.22 - 5.81 MIL/uL   Hemoglobin 13.6 13.0 - 17.0 g/dL    HCT 43.0 39.0 - 52.0 %   MCV 86.9 78.0 - 100.0 fL   MCH 27.5 26.0 - 34.0 pg   MCHC 31.6 30.0 - 36.0 g/dL   RDW 16.3 (H) 11.5 - 15.5 %   Platelets 245 150 - 400 K/uL  Differential     Status: None   Collection Time: 11/24/16  5:58 PM  Result Value Ref Range   Neutrophils Relative % 48 %   Neutro Abs 3.3 1.7 - 7.7 K/uL   Lymphocytes Relative 40 %   Lymphs Abs 2.7 0.7 - 4.0 K/uL   Monocytes Relative 7 %   Monocytes Absolute 0.5 0.1 - 1.0 K/uL   Eosinophils Relative 3 %   Eosinophils Absolute 0.2 0.0 - 0.7 K/uL   Basophils Relative 2 %   Basophils Absolute 0.1 0.0 - 0.1 K/uL  Comprehensive metabolic panel     Status: Abnormal   Collection Time: 11/24/16  5:58 PM  Result Value Ref Range   Sodium 135 135 - 145 mmol/L   Potassium 4.6 3.5 - 5.1 mmol/L   Chloride 98 (L) 101 - 111 mmol/L   CO2 12 (L) 22 - 32 mmol/L  Glucose, Bld 739 (HH) 65 - 99 mg/dL    Comment: CRITICAL RESULT CALLED TO, READ BACK BY AND VERIFIED WITH: CHRISCOE,C RN 11/24/2016 1848 JORDANS    BUN 55 (H) 6 - 20 mg/dL   Creatinine, Ser 11.43 (H) 0.61 - 1.24 mg/dL   Calcium 9.4 8.9 - 10.3 mg/dL   Total Protein 7.5 6.5 - 8.1 g/dL   Albumin 3.1 (L) 3.5 - 5.0 g/dL   AST 32 15 - 41 U/L   ALT 22 17 - 63 U/L   Alkaline Phosphatase 69 38 - 126 U/L   Total Bilirubin 0.6 0.3 - 1.2 mg/dL   GFR calc non Af Amer 4 (L) >60 mL/min   GFR calc Af Amer 5 (L) >60 mL/min    Comment: (NOTE) The eGFR has been calculated using the CKD EPI equation. This calculation has not been validated in all clinical situations. eGFR's persistently <60 mL/min signify possible Chronic Kidney Disease.    Anion gap 25 (H) 5 - 15    Comment: RESULT CHECKED  I-stat troponin, ED (not at [facility], [facility])     Status: None   Collection Time: 11/24/16  6:02 PM  Result Value Ref Range   Troponin i, poc 0.01 0.00 - 0.08 ng/mL   Comment 3            Comment: Due to the release kinetics of cTnI, a negative result within the first hours of the onset of  symptoms does not rule out myocardial infarction with certainty. If myocardial infarction is still suspected, repeat the test at appropriate intervals.   I-stat chem 8, ed     Status: Abnormal   Collection Time: 11/24/16  6:04 PM  Result Value Ref Range   Sodium 135 135 - 145 mmol/L   Potassium 4.5 3.5 - 5.1 mmol/L   Chloride 102 101 - 111 mmol/L   BUN 54 (H) 6 - 20 mg/dL   Creatinine, Ser 11.10 (H) 0.61 - 1.24 mg/dL   Glucose, Bld >700 (HH) 65 - 99 mg/dL   Calcium, Ion 1.11 (L) 1.15 - 1.40 mmol/L   TCO2 17 0 - 100 mmol/L   Hemoglobin 15.6 13.0 - 17.0 g/dL   HCT 46.0 39.0 - 52.0 %   Comment NOTIFIED PHYSICIAN   I-Stat CG4 Lactic Acid, ED     Status: Abnormal   Collection Time: 11/24/16  6:09 PM  Result Value Ref Range   Lactic Acid, Venous 12.94 (HH) 0.5 - 1.9 mmol/L   Comment NOTIFIED PHYSICIAN   CBG monitoring, ED     Status: Abnormal   Collection Time: 11/24/16  8:21 PM  Result Value Ref Range   Glucose-Capillary >600 (HH) 65 - 99 mg/dL   Comment 1 Notify RN    Comment 2 Document in Chart   CBG monitoring, ED     Status: Abnormal   Collection Time: 11/24/16 10:26 PM  Result Value Ref Range   Glucose-Capillary >600 (HH) 65 - 99 mg/dL  CBC     Status: Abnormal   Collection Time: 11/24/16 10:36 PM  Result Value Ref Range   WBC 6.1 4.0 - 10.5 K/uL   RBC 4.73 4.22 - 5.81 MIL/uL   Hemoglobin 12.7 (L) 13.0 - 17.0 g/dL   HCT 39.2 39.0 - 52.0 %   MCV 82.9 78.0 - 100.0 fL   MCH 26.8 26.0 - 34.0 pg   MCHC 32.4 30.0 - 36.0 g/dL   RDW 16.2 (H) 11.5 - 15.5 %   Platelets 248 150 -  400 K/uL  Creatinine, serum     Status: Abnormal   Collection Time: 11/24/16 10:36 PM  Result Value Ref Range   Creatinine, Ser 11.39 (H) 0.61 - 1.24 mg/dL   GFR calc non Af Amer 4 (L) >60 mL/min   GFR calc Af Amer 5 (L) >60 mL/min    Comment: (NOTE) The eGFR has been calculated using the CKD EPI equation. This calculation has not been validated in all clinical situations. eGFR's persistently <60  mL/min signify possible Chronic Kidney Disease.   I-Stat arterial blood gas, ED     Status: Abnormal   Collection Time: 11/24/16 10:47 PM  Result Value Ref Range   pH, Arterial 7.375 7.350 - 7.450   pCO2 arterial 32.4 32.0 - 48.0 mmHg   pO2, Arterial 55.0 (L) 83.0 - 108.0 mmHg   Bicarbonate 19.1 (L) 20.0 - 28.0 mmol/L   TCO2 20 0 - 100 mmol/L   O2 Saturation 89.0 %   Acid-base deficit 5.0 (H) 0.0 - 2.0 mmol/L   Patient temperature 97.6 F    Collection site RADIAL, ALLEN'S TEST ACCEPTABLE    Drawn by RT    Sample type ARTERIAL   MRSA PCR Screening     Status: None   Collection Time: 11/24/16 11:18 PM  Result Value Ref Range   MRSA by PCR NEGATIVE NEGATIVE    Comment:        The GeneXpert MRSA Assay (FDA approved for NASAL specimens only), is one component of a comprehensive MRSA colonization surveillance program. It is not intended to diagnose MRSA infection nor to guide or monitor treatment for MRSA infections.   Glucose, capillary     Status: Abnormal   Collection Time: 11/24/16 11:39 PM  Result Value Ref Range   Glucose-Capillary >600 (HH) 65 - 99 mg/dL  Glucose, capillary     Status: Abnormal   Collection Time: 11/25/16 12:29 AM  Result Value Ref Range   Glucose-Capillary 559 (HH) 65 - 99 mg/dL  Glucose, capillary     Status: Abnormal   Collection Time: 11/25/16  1:16 AM  Result Value Ref Range   Glucose-Capillary 513 (HH) 65 - 99 mg/dL  Glucose, capillary     Status: Abnormal   Collection Time: 11/25/16  2:19 AM  Result Value Ref Range   Glucose-Capillary 389 (H) 65 - 99 mg/dL  Glucose, capillary     Status: Abnormal   Collection Time: 11/25/16  3:27 AM  Result Value Ref Range   Glucose-Capillary 314 (H) 65 - 99 mg/dL  Glucose, capillary     Status: Abnormal   Collection Time: 11/25/16  4:30 AM  Result Value Ref Range   Glucose-Capillary 244 (H) 65 - 99 mg/dL  Glucose, capillary     Status: Abnormal   Collection Time: 11/25/16  5:41 AM  Result Value Ref  Range   Glucose-Capillary 208 (H) 65 - 99 mg/dL  Glucose, capillary     Status: Abnormal   Collection Time: 11/25/16  6:45 AM  Result Value Ref Range   Glucose-Capillary 152 (H) 65 - 99 mg/dL  CBC     Status: Abnormal   Collection Time: 11/25/16  7:32 AM  Result Value Ref Range   WBC 8.8 4.0 - 10.5 K/uL   RBC 4.31 4.22 - 5.81 MIL/uL   Hemoglobin 11.5 (L) 13.0 - 17.0 g/dL   HCT 36.4 (L) 39.0 - 52.0 %   MCV 84.5 78.0 - 100.0 fL   MCH 26.7 26.0 - 34.0 pg  MCHC 31.6 30.0 - 36.0 g/dL   RDW 16.6 (H) 11.5 - 15.5 %   Platelets 188 150 - 400 K/uL  Basic metabolic panel     Status: Abnormal   Collection Time: 11/25/16  7:32 AM  Result Value Ref Range   Sodium 138 135 - 145 mmol/L   Potassium 3.4 (L) 3.5 - 5.1 mmol/L   Chloride 101 101 - 111 mmol/L   CO2 24 22 - 32 mmol/L   Glucose, Bld 150 (H) 65 - 99 mg/dL   BUN 63 (H) 6 - 20 mg/dL   Creatinine, Ser 11.30 (H) 0.61 - 1.24 mg/dL   Calcium 8.5 (L) 8.9 - 10.3 mg/dL   GFR calc non Af Amer 4 (L) >60 mL/min   GFR calc Af Amer 5 (L) >60 mL/min    Comment: (NOTE) The eGFR has been calculated using the CKD EPI equation. This calculation has not been validated in all clinical situations. eGFR's persistently <60 mL/min signify possible Chronic Kidney Disease.    Anion gap 13 5 - 15  Magnesium     Status: None   Collection Time: 11/25/16  7:32 AM  Result Value Ref Range   Magnesium 1.9 1.7 - 2.4 mg/dL  Phosphorus     Status: None   Collection Time: 11/25/16  7:32 AM  Result Value Ref Range   Phosphorus 3.5 2.5 - 4.6 mg/dL  Magnesium     Status: None   Collection Time: 11/25/16  9:52 AM  Result Value Ref Range   Magnesium 1.9 1.7 - 2.4 mg/dL  Phosphorus     Status: None   Collection Time: 11/25/16  9:52 AM  Result Value Ref Range   Phosphorus 4.5 2.5 - 4.6 mg/dL  Blood gas, arterial     Status: None   Collection Time: 11/25/16 10:28 AM  Result Value Ref Range   FIO2 80.00    Delivery systems VENTILATOR    VT 580 mL   LHR 15  resp/min   Peep/cpap 5.0 cm H20   pH, Arterial 7.424 7.350 - 7.450   pCO2 arterial 38.0 32.0 - 48.0 mmHg   pO2, Arterial 105 83.0 - 108.0 mmHg   Bicarbonate 24.5 20.0 - 28.0 mmol/L   Acid-Base Excess 0.6 0.0 - 2.0 mmol/L   O2 Saturation 97.5 %   Patient temperature 98.6    Collection site LEFT RADIAL    Drawn by [id number]    Sample type ARTERIAL DRAW    Allens test (pass/fail) PASS PASS    Recent Results (from the past 240 hour(s))  MRSA PCR Screening     Status: None   Collection Time: 11/24/16 11:18 PM  Result Value Ref Range Status   MRSA by PCR NEGATIVE NEGATIVE Final    Comment:        The GeneXpert MRSA Assay (FDA approved for NASAL specimens only), is one component of a comprehensive MRSA colonization surveillance program. It is not intended to diagnose MRSA infection nor to guide or monitor treatment for MRSA infections.     Lipid Panel No results for input(s): CHOL, TRIG, HDL, CHOLHDL, VLDL, LDLCALC in the last 72 hours.  Studies/Results: Ct Angio Head W Or Wo Contrast  Result Date: 11/24/2016 CLINICAL DATA:  Dialysis patient. Diabetes. Unresponsive. Negative acute head CT. EXAM: CT ANGIOGRAPHY HEAD AND NECK TECHNIQUE: Multidetector CT imaging of the head and neck was performed using the standard protocol during bolus administration of intravenous contrast. Multiplanar CT image reconstructions and MIPs were obtained to evaluate  the vascular anatomy. Carotid stenosis measurements (when applicable) are obtained utilizing NASCET criteria, using the distal internal carotid diameter as the denominator. CONTRAST:  50 cc Isovue 370 COMPARISON:  Earlier same day CT FINDINGS: CTA NECK FINDINGS Aortic arch: Mild atherosclerotic change. No aneurysm or dissection. Common origin of the innominate artery and left common carotid artery. Right carotid system: Common carotid artery is widely patent to the bifurcation. The carotid bifurcation does not show atherosclerotic change. No  narrowing or irregularity. Cervical ICA is normal. Left carotid system: Common carotid artery is widely patent to the bifurcation. The carotid bifurcation does not show atherosclerotic change. No narrowing or irregularity. Cervical ICA is normal. Vertebral arteries: Both vertebral artery origins are widely patent. Minimal atherosclerotic calcification at the left vertebral artery origin. Both vertebral arteries are widely patent through the cervical region to the foramen magnum. Skeleton: Negative Other neck: No significant soft tissue finding. Patient is intubated. Upper chest: Patchy dependent atelectasis and are infiltrate bilaterally. Possible aspiration or community acquired pneumonia. Review of the MIP images confirms the above findings CTA HEAD FINDINGS Anterior circulation: Both internal carotid arteries are widely patent through the skullbase and siphon regions. Mild peripheral siphon calcification without stenosis. Anterior and middle cerebral vessels are patent without stenosis, aneurysm or vascular malformation. No missing branch vessels are identified. Posterior circulation: Both vertebral arteries are widely patent to the basilar. Mild calcification at the foramen magnum level without stenosis. No vertebral stenosis. Posterior circulation branch vessels are normal. Venous sinuses: Patent and normal Anatomic variants: None significant Delayed phase: No abnormal enhancement Review of the MIP images confirms the above findings IMPRESSION: CT angiography is negative for large vessel occlusion. Carotid bifurcations are normal. Only very minimal atherosclerotic change is demonstrated in the region imaged. These results were called by telephone at the time of interpretation on 11/24/2016 at 6:40 pm to Dr. Kerney Elbe , who verbally acknowledged these results. Initial code stroke head CT results also communicated at this time. Electronically Signed   By: Nelson Chimes M.D.   On: 11/24/2016 18:47   Ct Angio  Neck W Or Wo Contrast  Result Date: 11/24/2016 CLINICAL DATA:  Dialysis patient. Diabetes. Unresponsive. Negative acute head CT. EXAM: CT ANGIOGRAPHY HEAD AND NECK TECHNIQUE: Multidetector CT imaging of the head and neck was performed using the standard protocol during bolus administration of intravenous contrast. Multiplanar CT image reconstructions and MIPs were obtained to evaluate the vascular anatomy. Carotid stenosis measurements (when applicable) are obtained utilizing NASCET criteria, using the distal internal carotid diameter as the denominator. CONTRAST:  50 cc Isovue 370 COMPARISON:  Earlier same day CT FINDINGS: CTA NECK FINDINGS Aortic arch: Mild atherosclerotic change. No aneurysm or dissection. Common origin of the innominate artery and left common carotid artery. Right carotid system: Common carotid artery is widely patent to the bifurcation. The carotid bifurcation does not show atherosclerotic change. No narrowing or irregularity. Cervical ICA is normal. Left carotid system: Common carotid artery is widely patent to the bifurcation. The carotid bifurcation does not show atherosclerotic change. No narrowing or irregularity. Cervical ICA is normal. Vertebral arteries: Both vertebral artery origins are widely patent. Minimal atherosclerotic calcification at the left vertebral artery origin. Both vertebral arteries are widely patent through the cervical region to the foramen magnum. Skeleton: Negative Other neck: No significant soft tissue finding. Patient is intubated. Upper chest: Patchy dependent atelectasis and are infiltrate bilaterally. Possible aspiration or community acquired pneumonia. Review of the MIP images confirms the above findings CTA HEAD FINDINGS  Anterior circulation: Both internal carotid arteries are widely patent through the skullbase and siphon regions. Mild peripheral siphon calcification without stenosis. Anterior and middle cerebral vessels are patent without stenosis,  aneurysm or vascular malformation. No missing branch vessels are identified. Posterior circulation: Both vertebral arteries are widely patent to the basilar. Mild calcification at the foramen magnum level without stenosis. No vertebral stenosis. Posterior circulation branch vessels are normal. Venous sinuses: Patent and normal Anatomic variants: None significant Delayed phase: No abnormal enhancement Review of the MIP images confirms the above findings IMPRESSION: CT angiography is negative for large vessel occlusion. Carotid bifurcations are normal. Only very minimal atherosclerotic change is demonstrated in the region imaged. These results were called by telephone at the time of interpretation on 11/24/2016 at 6:40 pm to Dr. Kerney Elbe , who verbally acknowledged these results. Initial code stroke head CT results also communicated at this time. Electronically Signed   By: Nelson Chimes M.D.   On: 11/24/2016 18:47   Dg Chest Portable 1 View  Result Date: 11/24/2016 CLINICAL DATA:  Status post intubation. EXAM: PORTABLE CHEST 1 VIEW COMPARISON:  06/04/2014 FINDINGS: Endotracheal tube is in place with tip approximately 3.1 cm above the carina. Nasogastric tube is in place with tip off the image beyond the gastroesophageal junction. Shallow lung inflation. Heart size is accentuated by the portable technique. There is right perihilar atelectasis. Perihilar fullness may be related to shallow lung inflation. However adenopathy cannot be excluded. IMPRESSION: 1. Status post intubation and placement of nasogastric tube. 2. Right perihilar atelectasis. 3. Perihilar fullness bilaterally and may be related to shallow inflation but adenopathy is not excluded. Follow-up chest x-rays and/or chest CT is recommended as appropriate. Electronically Signed   By: Nolon Nations M.D.   On: 11/24/2016 18:58   Ct Head Code Stroke W/o Cm  Result Date: 11/24/2016 CLINICAL DATA:  Code stroke. Unresponsive. Dialysis patient.  Hyperglycemia. Code stroke. EXAM: CT HEAD WITHOUT CONTRAST TECHNIQUE: Contiguous axial images were obtained from the base of the skull through the vertex without intravenous contrast. COMPARISON:  None. FINDINGS: Brain: Moderate generalized brain atrophy. Chronic appearing small vessel ischemic changes. No sign of acute infarction, mass lesion, hemorrhage, hydrocephalus or extra-axial collection. Vascular: Atherosclerotic calcification of the major vessels at the base of the brain. No more peripheral hyperdense vessels. Skull: Negative Sinuses/Orbits: Clear Other: None ASPECTS ([place] Stroke Program Early CT Score) - Ganglionic level infarction (caudate, lentiform nuclei, internal capsule, insula, M1-M3 cortex): 7 - Supraganglionic infarction (M4-M6 cortex): 3 Total score (0-10 with 10 being normal): 10 IMPRESSION: 1. Negative acute code stroke study. Atrophy and chronic small vessel change of the white matter. 2. ASPECTS is 10 Page was placed at the time of interpretation on 11/24/2016 at 6:20 pm. Still awaiting response. Electronically Signed   By: Nelson Chimes M.D.   On: 11/24/2016 18:23    Medications:  Scheduled: . chlorhexidine gluconate (MEDLINE KIT)  15 mL Mouth Rinse BID  . dialysis solution 1.5% low-MG/low-CA   Intraperitoneal Q24H  . dialysis solution 2.5% low-MG/low-CA   Intraperitoneal Q24H  . famotidine  20 mg Oral Daily  . fentaNYL (SUBLIMAZE) injection  50 mcg Intravenous Once  . gentamicin cream  1 application Topical Daily  . heparin  5,000 Units Subcutaneous Q8H  . hydrALAZINE  50 mg Per Tube Q8H  . insulin aspart  1 Units Subcutaneous Q4H  . insulin aspart  1-3 Units Subcutaneous Q4H  . mouth rinse  15 mL Mouth Rinse 10 times per day  Assessment: 59 y.o. male with acute onset of unresponsiveness followed by focal seizure activity with subsequent generalization 1. Overall clinical picture most consistent with status epilepticus. Generalized tremulousness occurred after CT and  CTA were completed, in the context of recent administration of paralytic and sedation for intubation. Tremulousness suggestive of continuing generalized seizure activity.  2. Severely elevated blood sugar of 700. Most likely the precipitant of his seizure.  3. CT was negative for acute findings and CTA with no LVO.  4. Currently sedated with Versed gtt 15 mg/hr.   Plan: 1. Continue Keppra at 1000 mg IV BID, next dose in 12 hours.  2. Decreasing Versed gtt to 2.5 mg/hr. Reevaluate at 8:00 PM. 3. EEG showed no electrographic seizure activity.  4. Blood sugars significantly improved.  5. BP management.  6. Seizure precautions.  7. MRI when extubated.    LOS: 1 day   '@Electronically'$  signed: Dr. Kerney Elbe 11/25/2016  4:14 PM

## 2016-11-25 NOTE — Progress Notes (Signed)
PULMONARY / CRITICAL CARE MEDICINE   Name: Jared [name] MRN: [id number] DOB: 1956-08-24    ADMISSION DATE:  11/24/2016 CONSULTATION DATE:    REFERRING MD:  EDP  CHIEF COMPLAINT:  Altered mental status, seizure, hyperglycemia  SUBJECTIVE:  Sedated on vent   VITAL SIGNS: BP 123/79   Pulse 94   Temp 99.8 F (37.7 C) (Axillary)   Resp 15   Ht 5\' 10"  (1.778 m)   Wt 182 lb 12.2 oz (82.9 kg)   SpO2 99%   BMI 26.22 kg/m   HEMODYNAMICS:    VENTILATOR SETTINGS: Vent Mode: PRVC FiO2 (%):  [40 %-70 %] 40 % Set Rate:  [15 bmp] 15 bmp Vt Set:  [580 mL] 580 mL PEEP:  [5 cmH20] 5 cmH20 Plateau Pressure:  [18 cmH20-19 cmH20] 19 cmH20  INTAKE / OUTPUT: I/O last 3 completed shifts: In: 182 [I.V.:655; IV Piggyback:100] Out: -   PHYSICAL EXAMINATION:  General Well nourished, well developed, no apparent distress  HEENT No gross abnormalities. ETT/OGT in place  Pulmonary Clear to auscultation bilaterally with no wheezes, rales or ronchi. Good effort, symmetrical expansion.   Cardiovascular Normal rate, regular rhythm. S1, s2. No m/r/g. Distal pulses palpable.  Abdomen Soft, non-tender, distended, positive bowel sounds, no palpable organomegaly or masses. Normoresonant to percussion. PD cathter insertion site clean and dry, no exudate or erythema  Musculoskeletal Grossly normal  Lymphatics No cervical, supraclavicular or axillary adenopathy.   Neurologic Sedated on versed. Responds briskly to noxious stimuli in all 4 extremities despite versed gtt @ 15   Skin/Integuement No rash, no cyanosis, no clubbing.      LABS:  BMET  Recent Labs Lab 11/24/16 1758 11/24/16 1804 11/24/16 2236 11/25/16 0732  NA 135 135  --  138  K 4.6 4.5  --  3.4*  CL 98* 102  --  101  CO2 12*  --   --  24  BUN 55* 54*  --  63*  CREATININE 11.43* 11.10* 11.39* 11.30*  GLUCOSE 739* >700*  --  150*    Electrolytes  Recent Labs Lab 11/24/16 1758 11/25/16 0732  CALCIUM 9.4 8.5*  MG  --   1.9  PHOS  --  3.5    CBC  Recent Labs Lab 11/24/16 1758 11/24/16 1804 11/24/16 2236 11/25/16 0732  WBC 6.7  --  6.1 8.8  HGB 13.6 15.6 12.7* 11.5*  HCT 43.0 46.0 39.2 36.4*  PLT 245  --  248 188    Coag's  Recent Labs Lab 11/24/16 1758  APTT 32  INR 1.17    Sepsis Markers  Recent Labs Lab 11/24/16 1809  LATICACIDVEN 12.94*    ABG  Recent Labs Lab 11/24/16 2247  PHART 7.375  PCO2ART 32.4  PO2ART 55.0*    Liver Enzymes  Recent Labs Lab 11/24/16 1758  AST 32  ALT 22  ALKPHOS 69  BILITOT 0.6  ALBUMIN 3.1*    Cardiac Enzymes No results for input(s): TROPONINI, PROBNP in the last 168 hours.  Glucose  Recent Labs Lab 11/25/16 0116 11/25/16 0219 11/25/16 0327 11/25/16 0430 11/25/16 0541 11/25/16 0645  GLUCAP 513* 389* 314* 244* 208* 152*    Imaging Ct Angio Head W Or Wo Contrast  Result Date: 11/24/2016 CLINICAL DATA:  Dialysis patient. Diabetes. Unresponsive. Negative acute head CT. EXAM: CT ANGIOGRAPHY HEAD AND NECK TECHNIQUE: Multidetector CT imaging of the head and neck was performed using the standard protocol during bolus administration of intravenous contrast. Multiplanar CT image reconstructions and MIPs were  obtained to evaluate the vascular anatomy. Carotid stenosis measurements (when applicable) are obtained utilizing NASCET criteria, using the distal internal carotid diameter as the denominator. CONTRAST:  50 cc Isovue 370 COMPARISON:  Earlier same day CT FINDINGS: CTA NECK FINDINGS Aortic arch: Mild atherosclerotic change. No aneurysm or dissection. Common origin of the innominate artery and left common carotid artery. Right carotid system: Common carotid artery is widely patent to the bifurcation. The carotid bifurcation does not show atherosclerotic change. No narrowing or irregularity. Cervical ICA is normal. Left carotid system: Common carotid artery is widely patent to the bifurcation. The carotid bifurcation does not show  atherosclerotic change. No narrowing or irregularity. Cervical ICA is normal. Vertebral arteries: Both vertebral artery origins are widely patent. Minimal atherosclerotic calcification at the left vertebral artery origin. Both vertebral arteries are widely patent through the cervical region to the foramen magnum. Skeleton: Negative Other neck: No significant soft tissue finding. Patient is intubated. Upper chest: Patchy dependent atelectasis and are infiltrate bilaterally. Possible aspiration or community acquired pneumonia. Review of the MIP images confirms the above findings CTA HEAD FINDINGS Anterior circulation: Both internal carotid arteries are widely patent through the skullbase and siphon regions. Mild peripheral siphon calcification without stenosis. Anterior and middle cerebral vessels are patent without stenosis, aneurysm or vascular malformation. No missing branch vessels are identified. Posterior circulation: Both vertebral arteries are widely patent to the basilar. Mild calcification at the foramen magnum level without stenosis. No vertebral stenosis. Posterior circulation branch vessels are normal. Venous sinuses: Patent and normal Anatomic variants: None significant Delayed phase: No abnormal enhancement Review of the MIP images confirms the above findings IMPRESSION: CT angiography is negative for large vessel occlusion. Carotid bifurcations are normal. Only very minimal atherosclerotic change is demonstrated in the region imaged. These results were called by telephone at the time of interpretation on 11/24/2016 at 6:40 pm to Dr. Kerney Elbe , who verbally acknowledged these results. Initial code stroke head CT results also communicated at this time. Electronically Signed   By: Nelson Chimes M.D.   On: 11/24/2016 18:47   Ct Angio Neck W Or Wo Contrast  Result Date: 11/24/2016 CLINICAL DATA:  Dialysis patient. Diabetes. Unresponsive. Negative acute head CT. EXAM: CT ANGIOGRAPHY HEAD AND NECK  TECHNIQUE: Multidetector CT imaging of the head and neck was performed using the standard protocol during bolus administration of intravenous contrast. Multiplanar CT image reconstructions and MIPs were obtained to evaluate the vascular anatomy. Carotid stenosis measurements (when applicable) are obtained utilizing NASCET criteria, using the distal internal carotid diameter as the denominator. CONTRAST:  50 cc Isovue 370 COMPARISON:  Earlier same day CT FINDINGS: CTA NECK FINDINGS Aortic arch: Mild atherosclerotic change. No aneurysm or dissection. Common origin of the innominate artery and left common carotid artery. Right carotid system: Common carotid artery is widely patent to the bifurcation. The carotid bifurcation does not show atherosclerotic change. No narrowing or irregularity. Cervical ICA is normal. Left carotid system: Common carotid artery is widely patent to the bifurcation. The carotid bifurcation does not show atherosclerotic change. No narrowing or irregularity. Cervical ICA is normal. Vertebral arteries: Both vertebral artery origins are widely patent. Minimal atherosclerotic calcification at the left vertebral artery origin. Both vertebral arteries are widely patent through the cervical region to the foramen magnum. Skeleton: Negative Other neck: No significant soft tissue finding. Patient is intubated. Upper chest: Patchy dependent atelectasis and are infiltrate bilaterally. Possible aspiration or community acquired pneumonia. Review of the MIP images confirms the above findings  CTA HEAD FINDINGS Anterior circulation: Both internal carotid arteries are widely patent through the skullbase and siphon regions. Mild peripheral siphon calcification without stenosis. Anterior and middle cerebral vessels are patent without stenosis, aneurysm or vascular malformation. No missing branch vessels are identified. Posterior circulation: Both vertebral arteries are widely patent to the basilar. Mild  calcification at the foramen magnum level without stenosis. No vertebral stenosis. Posterior circulation branch vessels are normal. Venous sinuses: Patent and normal Anatomic variants: None significant Delayed phase: No abnormal enhancement Review of the MIP images confirms the above findings IMPRESSION: CT angiography is negative for large vessel occlusion. Carotid bifurcations are normal. Only very minimal atherosclerotic change is demonstrated in the region imaged. These results were called by telephone at the time of interpretation on 11/24/2016 at 6:40 pm to Dr. Kerney Elbe , who verbally acknowledged these results. Initial code stroke head CT results also communicated at this time. Electronically Signed   By: Nelson Chimes M.D.   On: 11/24/2016 18:47   Dg Chest Portable 1 View  Result Date: 11/24/2016 CLINICAL DATA:  Status post intubation. EXAM: PORTABLE CHEST 1 VIEW COMPARISON:  06/04/2014 FINDINGS: Endotracheal tube is in place with tip approximately 3.1 cm above the carina. Nasogastric tube is in place with tip off the image beyond the gastroesophageal junction. Shallow lung inflation. Heart size is accentuated by the portable technique. There is right perihilar atelectasis. Perihilar fullness may be related to shallow lung inflation. However adenopathy cannot be excluded. IMPRESSION: 1. Status post intubation and placement of nasogastric tube. 2. Right perihilar atelectasis. 3. Perihilar fullness bilaterally and may be related to shallow inflation but adenopathy is not excluded. Follow-up chest x-rays and/or chest CT is recommended as appropriate. Electronically Signed   By: Nolon Nations M.D.   On: 11/24/2016 18:58   Ct Head Code Stroke W/o Cm  Result Date: 11/24/2016 CLINICAL DATA:  Code stroke. Unresponsive. Dialysis patient. Hyperglycemia. Code stroke. EXAM: CT HEAD WITHOUT CONTRAST TECHNIQUE: Contiguous axial images were obtained from the base of the skull through the vertex without  intravenous contrast. COMPARISON:  None. FINDINGS: Brain: Moderate generalized brain atrophy. Chronic appearing small vessel ischemic changes. No sign of acute infarction, mass lesion, hemorrhage, hydrocephalus or extra-axial collection. Vascular: Atherosclerotic calcification of the major vessels at the base of the brain. No more peripheral hyperdense vessels. Skull: Negative Sinuses/Orbits: Clear Other: None ASPECTS ([place] Stroke Program Early CT Score) - Ganglionic level infarction (caudate, lentiform nuclei, internal capsule, insula, M1-M3 cortex): 7 - Supraganglionic infarction (M4-M6 cortex): 3 Total score (0-10 with 10 being normal): 10 IMPRESSION: 1. Negative acute code stroke study. Atrophy and chronic small vessel change of the white matter. 2. ASPECTS is 10 Page was placed at the time of interpretation on 11/24/2016 at 6:20 pm. Still awaiting response. Electronically Signed   By: Nelson Chimes M.D.   On: 11/24/2016 18:23   STUDIES:    CULTURES: none  ANTIBIOTICS: none  SIGNIFICANT EVENTS:   LINES/TUBES: PIV ETT 12/29 >>  DISCUSSION: [name] is a 56M with PMH significant for IDDM, ESRD on PD, HTN, who presented with concern for acute stroke and had witnessed seizures in the ED. CT and CTA of the head were negative for signs of stroke. The thought is that profound hyperglycemia vs hypertensive encephalopathy precipitated his seizure. He was also markedly hypertensive - it is unclear what the underlying etiology for his acute hypertensive crisis and hyperglycemia is - there is no suggestion of an infectious process at this time. Spot  EEG was negative for active seizure activity. Remains on versed gtt. Glucose better, anion gap closed.  For today: -Renal consult -await neurology input. Hope to start weaning versed gtt.  -can start weaning vent after versed off -supportive care: start nutrition, PD, and replace K  ASSESSMENT / PLAN:  PULMONARY A: Need for intubation 2/2  mental status Bibasilar atx and hypoxia prior to intubation  P:   Continue ventilatory support Repeat abg F/u am cxr Wean as tolerated SBT when able  CARDIOVASCULAR A:  Hypertension - improved P:  Continue sedation/analgesia Resume home BP meds as able PRN labetalol for SBP > 180  RENAL A:   ESRD on peritoneal dialysis - no acute need for dialysis Elevated lactate (likely 2/2 seizure) P:   Will call Nephrology  Monitor lytes  GASTROINTESTINAL A:   No acute issues P:   NPO Stress ulcer ppx  HEMATOLOGIC A:   No acute issues P:  Monitor  INFECTIOUS A:   No overt signs or symptoms of infection P:   For completeness sake, check UA and blood cultures  ENDOCRINE A:   IDDM w/ marked hyperglycemia P:   Insulin gtt  NEUROLOGIC A:   Seizure, likely 2/2 hyperglycemia >EEG 12/29 negative for seizure activity  P:   RASS goal: 0 Wean Versed as able / per neurology If concern for persistent seizures, LTM Continue Keppra for now   FAMILY  - Updates: brother-in-law and son at bedside. Son is next of kin, but patient lives with sister, who is very involved in his care. Consensus is that patient would wish to be FULL CODE.   - Inter-disciplinary family meet or Palliative Care meeting due by:  day 7  My critical care time 30 minutes  Erick Colace ACNP-BC [place] Pager # [phone] OR # [phone] if no answer 11/25/2016, 9:27 AM  Attending Note:  I have examined patient, reviewed labs, studies and notes. I have discussed the case with Jerrye Bushy, and I agree with the data and plans as amended above. 59 yo man, hx DM, CKD on PD, HTN, suffered an acute CVA with seizures. Required intubation and deep sedation to break seizures. On eval today he remains sedated, comfortable on MV, coarse breath sounds. He is being empirically treated for possible infxn as a precipitant of seizures. Also correcting glucose and BP. Plan to lighten sedation when OK  with neurology, push SBT and hopefully extubation. Appreciate Renal's assistance with PD. Independent critical care time is 35 minutes.   Baltazar Apo, MD, PhD 11/25/2016, 3:13 PM [facility] [phone] or if no answer [phone]

## 2016-11-25 NOTE — H&P (Deleted)
[facility] Renal Consultation Note    Indication for Consultation:  Management of ESRD/hemodialysis; anemia, hypertension/volume and secondary hyperparathyroidism Primary Nephrologist: Jamal Maes, MD  HPI: Jared [name] is a 60 y.o. 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  He dialyzed at [facility] before transitioning to PD February 2016 and is followed by Dr. Jamal Maes who last saw him 12/20 at his routine monthly appointment. At that time home BP per her not was variable, but generally ok. Home PD has been going well.He reported some insurance issues and variable compliance with calcitriol. Kinetics and Hgb have been good, iPTH is elevated.  P variable but better lately BS tend to run high - last hgb A1C was 9.4 in October.  He is seen today following admission for AMS, seizure and hyperglycemia following admission last pm.  Per H and P he had an acute change in MS about 1700 12/29, complained of a HA and stopped talking.  He was triaged as a code stroke and began seizing upon arrival to the ED. Seizure was aborted with IV ativan in the ED. He was intubated and BS was >600. BP was markedly elevated. CT and CTA were neg for acute findings. Neuro evaluated and felt seizure was related to elevated BS.  I did speak to his brother-in-law who is in the room, whom he lives with who noted that he otherwise had been feeling well prior to this event without N, V, D.  He also reported that the patient, who works for [employer] drove to and from [place] about 1 - 4 pm yesterday on work related issue. Per the same, the patient sometimes does his CCPD during the day. He does not know if the patient has a PCP.  Past Medical History:  Diagnosis Date  . Anemia   . Chronic kidney disease    on dialysis M-W-F  . Diabetes mellitus without complication ([place])    onset as adult  . Hypertension   . Thyroid disease     hyperparathyroidism   Past Surgical History:  Procedure Laterality Date  . AV FISTULA PLACEMENT Left 06/04/2014   Procedure: ARTERIOVENOUS (AV) FISTULA CREATION-  BRACHIOCEPHALIC WITH LIGATION OF COMPETEING BRANCH;  Surgeon: Mal Misty, MD;  Location: [facility];  Service: Vascular;  Laterality: Left;  . INSERTION OF DIALYSIS CATHETER  04/2014   Family History  Problem Relation Age of Onset  . Cancer - Lung Mother   . Cancer - Other Father    Social History:  reports that he quit smoking about 32 years ago. He has never used smokeless tobacco. He reports that he drinks alcohol. He reports that he does not use drugs. Allergies  Allergen Reactions  . Penicillins Other (See Comments)    Has patient had a PCN reaction causing immediate rash, facial/tongue/throat swelling, SOB or lightheadedness with hypotension: Unk Has patient had a PCN reaction causing severe rash involving mucus membranes or skin necrosis: Unk Has patient had a PCN reaction that required hospitalization: Unk Has patient had a PCN reaction occurring within the last 10 years: Unk If all of the above answers are "NO", then may proceed with Cephalosporin use.    Prior to Admission medications   Medication Sig Start Date End Date Taking? Authorizing Provider  amLODipine (NORVASC) 10 MG tablet Take 10 mg by mouth daily.    Historical Provider, MD  b complex-vitamin c-folic acid (NEPHRO-VITE) 0.8 MG TABS tablet Take  1 tablet by mouth at bedtime.    Historical Provider, MD  calcitRIOL (ROCALTROL) 0.5 MCG capsule Take 0.5 mcg by mouth daily.    Historical Provider, MD  cinacalcet (SENSIPAR) 30 MG tablet Take 30 mg by mouth daily.    Historical Provider, MD  hydrALAZINE (APRESOLINE) 100 MG tablet Take 100 mg by mouth 3 (three) times daily.    Historical Provider, MD  Insulin NPH Human, Isophane, (NOVOLIN N [place]) Inject 4-19 Units into the skin 2 (two) times daily.     Historical Provider, MD  insulin regular (NOVOLIN R,HUMULIN R) 100  units/mL injection Inject 2-6 Units into the skin 2 (two) times daily before a meal.     Historical Provider, MD  metoprolol (LOPRESSOR) 100 MG tablet Take 100 mg by mouth 2 (two) times daily.     Historical Provider, MD  oxyCODONE (ROXICODONE) 5 MG immediate release tablet Take 1 tablet (5 mg total) by mouth every 6 (six) hours as needed for severe pain. 06/04/14   Samantha J Rhyne, PA-C  sevelamer carbonate (RENVELA) 800 MG tablet Take 800 mg by mouth 3 (three) times daily with meals.    Historical Provider, MD   Current Facility-Administered Medications  Medication Dose Route Frequency Provider Last Rate Last Dose  . 0.9 %  sodium chloride infusion  250 mL Intravenous PRN Dannielle Burn, MD      . chlorhexidine gluconate (MEDLINE KIT) (PERIDEX) 0.12 % solution 15 mL  15 mL Mouth Rinse BID Rigoberto Noel, MD   15 mL at 11/25/16 0800  . dextrose 50 % solution           . famotidine (PEPCID) tablet 20 mg  20 mg Oral Daily Collene Gobble, MD      . feeding supplement (PRO-STAT SUGAR FREE 64) liquid 30 mL  30 mL Per Tube BID Erick Colace, NP      . feeding supplement (VITAL HIGH PROTEIN) liquid 1,000 mL  1,000 mL Per Tube Q24H Erick Colace, NP      . fentaNYL (SUBLIMAZE) 2,500 mcg in sodium chloride 0.9 % 250 mL (10 mcg/mL) infusion  25-400 mcg/hr Intravenous Continuous Kerney Elbe, MD 4 mL/hr at 11/25/16 0851 40 mcg/hr at 11/25/16 0851  . fentaNYL (SUBLIMAZE) bolus via infusion 50 mcg  50 mcg Intravenous Q1H PRN Kerney Elbe, MD      . fentaNYL (SUBLIMAZE) injection 50 mcg  50 mcg Intravenous Once Kerney Elbe, MD      . heparin injection 5,000 Units  5,000 Units Subcutaneous Q8H Dannielle Burn, MD   5,000 Units at 11/25/16 [phone]  . hydrALAZINE (APRESOLINE) tablet 50 mg  50 mg Per Tube Q8H Erick Colace, NP      . insulin regular (NOVOLIN R,HUMULIN R) 250 Units in sodium chloride 0.9 % 250 mL (1 Units/mL) infusion   Intravenous Continuous Tobie Poet, DO   Stopped at 11/25/16 [phone]   . labetalol (NORMODYNE,TRANDATE) injection 20 mg  20 mg Intravenous Q10 min PRN Dannielle Burn, MD   20 mg at 11/25/16 0007  . MEDLINE mouth rinse  15 mL Mouth Rinse 10 times per day Rigoberto Noel, MD   15 mL at 11/25/16 0600  . midazolam (VERSED) 50 mg in sodium chloride 0.9 % 50 mL (1 mg/mL) infusion  0-10 mg/hr Intravenous Continuous Tobie Poet, DO 15 mL/hr at 11/25/16 0710 15 mg/hr at 11/25/16 0710  . midazolam (VERSED) bolus via infusion 1-2 mg  1-2 mg Intravenous Q2H  PRN Kerney Elbe, MD      . potassium chloride 20 MEQ/15ML (10%) solution 20 mEq  20 mEq Per Tube Once Erick Colace, NP       Labs: Basic Metabolic Panel:  Recent Labs Lab 11/24/16 1758 11/24/16 1804 11/24/16 2236 11/25/16 0732  NA 135 135  --  138  K 4.6 4.5  --  3.4*  CL 98* 102  --  101  CO2 12*  --   --  24  GLUCOSE 739* >700*  --  150*  BUN 55* 54*  --  63*  CREATININE 11.43* 11.10* 11.39* 11.30*  CALCIUM 9.4  --   --  8.5*  PHOS  --   --   --  3.5   Liver Function Tests:  Recent Labs Lab 11/24/16 1758  AST 32  ALT 22  ALKPHOS 69  BILITOT 0.6  PROT 7.5  ALBUMIN 3.1*   CBC:  Recent Labs Lab 11/24/16 1758 11/24/16 1804 11/24/16 2236 11/25/16 0732  WBC 6.7  --  6.1 8.8  NEUTROABS 3.3  --   --   --   HGB 13.6 15.6 12.7* 11.5*  HCT 43.0 46.0 39.2 36.4*  MCV 86.9  --  82.9 84.5  PLT 245  --  248 188   CBG:  Recent Labs Lab 11/25/16 0219 11/25/16 0327 11/25/16 0430 11/25/16 0541 11/25/16 0645  GLUCAP 389* 314* 244* 208* 152*   Studies/Results: Ct Angio Head W Or Wo Contrast  Result Date: 11/24/2016 CLINICAL DATA:  Dialysis patient. Diabetes. Unresponsive. Negative acute head CT. EXAM: CT ANGIOGRAPHY HEAD AND NECK TECHNIQUE: Multidetector CT imaging of the head and neck was performed using the standard protocol during bolus administration of intravenous contrast. Multiplanar CT image reconstructions and MIPs were obtained to evaluate the vascular anatomy. Carotid stenosis  measurements (when applicable) are obtained utilizing NASCET criteria, using the distal internal carotid diameter as the denominator. CONTRAST:  50 cc Isovue 370 COMPARISON:  Earlier same day CT FINDINGS: CTA NECK FINDINGS Aortic arch: Mild atherosclerotic change. No aneurysm or dissection. Common origin of the innominate artery and left common carotid artery. Right carotid system: Common carotid artery is widely patent to the bifurcation. The carotid bifurcation does not show atherosclerotic change. No narrowing or irregularity. Cervical ICA is normal. Left carotid system: Common carotid artery is widely patent to the bifurcation. The carotid bifurcation does not show atherosclerotic change. No narrowing or irregularity. Cervical ICA is normal. Vertebral arteries: Both vertebral artery origins are widely patent. Minimal atherosclerotic calcification at the left vertebral artery origin. Both vertebral arteries are widely patent through the cervical region to the foramen magnum. Skeleton: Negative Other neck: No significant soft tissue finding. Patient is intubated. Upper chest: Patchy dependent atelectasis and are infiltrate bilaterally. Possible aspiration or community acquired pneumonia. Review of the MIP images confirms the above findings CTA HEAD FINDINGS Anterior circulation: Both internal carotid arteries are widely patent through the skullbase and siphon regions. Mild peripheral siphon calcification without stenosis. Anterior and middle cerebral vessels are patent without stenosis, aneurysm or vascular malformation. No missing branch vessels are identified. Posterior circulation: Both vertebral arteries are widely patent to the basilar. Mild calcification at the foramen magnum level without stenosis. No vertebral stenosis. Posterior circulation branch vessels are normal. Venous sinuses: Patent and normal Anatomic variants: None significant Delayed phase: No abnormal enhancement Review of the MIP images  confirms the above findings IMPRESSION: CT angiography is negative for large vessel occlusion. Carotid bifurcations are normal. Only very  minimal atherosclerotic change is demonstrated in the region imaged. These results were called by telephone at the time of interpretation on 11/24/2016 at 6:40 pm to Dr. Kerney Elbe , who verbally acknowledged these results. Initial code stroke head CT results also communicated at this time. Electronically Signed   By: Nelson Chimes M.D.   On: 11/24/2016 18:47   Ct Angio Neck W Or Wo Contrast  Result Date: 11/24/2016 CLINICAL DATA:  Dialysis patient. Diabetes. Unresponsive. Negative acute head CT. EXAM: CT ANGIOGRAPHY HEAD AND NECK TECHNIQUE: Multidetector CT imaging of the head and neck was performed using the standard protocol during bolus administration of intravenous contrast. Multiplanar CT image reconstructions and MIPs were obtained to evaluate the vascular anatomy. Carotid stenosis measurements (when applicable) are obtained utilizing NASCET criteria, using the distal internal carotid diameter as the denominator. CONTRAST:  50 cc Isovue 370 COMPARISON:  Earlier same day CT FINDINGS: CTA NECK FINDINGS Aortic arch: Mild atherosclerotic change. No aneurysm or dissection. Common origin of the innominate artery and left common carotid artery. Right carotid system: Common carotid artery is widely patent to the bifurcation. The carotid bifurcation does not show atherosclerotic change. No narrowing or irregularity. Cervical ICA is normal. Left carotid system: Common carotid artery is widely patent to the bifurcation. The carotid bifurcation does not show atherosclerotic change. No narrowing or irregularity. Cervical ICA is normal. Vertebral arteries: Both vertebral artery origins are widely patent. Minimal atherosclerotic calcification at the left vertebral artery origin. Both vertebral arteries are widely patent through the cervical region to the foramen magnum. Skeleton:  Negative Other neck: No significant soft tissue finding. Patient is intubated. Upper chest: Patchy dependent atelectasis and are infiltrate bilaterally. Possible aspiration or community acquired pneumonia. Review of the MIP images confirms the above findings CTA HEAD FINDINGS Anterior circulation: Both internal carotid arteries are widely patent through the skullbase and siphon regions. Mild peripheral siphon calcification without stenosis. Anterior and middle cerebral vessels are patent without stenosis, aneurysm or vascular malformation. No missing branch vessels are identified. Posterior circulation: Both vertebral arteries are widely patent to the basilar. Mild calcification at the foramen magnum level without stenosis. No vertebral stenosis. Posterior circulation branch vessels are normal. Venous sinuses: Patent and normal Anatomic variants: None significant Delayed phase: No abnormal enhancement Review of the MIP images confirms the above findings IMPRESSION: CT angiography is negative for large vessel occlusion. Carotid bifurcations are normal. Only very minimal atherosclerotic change is demonstrated in the region imaged. These results were called by telephone at the time of interpretation on 11/24/2016 at 6:40 pm to Dr. Kerney Elbe , who verbally acknowledged these results. Initial code stroke head CT results also communicated at this time. Electronically Signed   By: Nelson Chimes M.D.   On: 11/24/2016 18:47   Dg Chest Portable 1 View  Result Date: 11/24/2016 CLINICAL DATA:  Status post intubation. EXAM: PORTABLE CHEST 1 VIEW COMPARISON:  06/04/2014 FINDINGS: Endotracheal tube is in place with tip approximately 3.1 cm above the carina. Nasogastric tube is in place with tip off the image beyond the gastroesophageal junction. Shallow lung inflation. Heart size is accentuated by the portable technique. There is right perihilar atelectasis. Perihilar fullness may be related to shallow lung inflation.  However adenopathy cannot be excluded. IMPRESSION: 1. Status post intubation and placement of nasogastric tube. 2. Right perihilar atelectasis. 3. Perihilar fullness bilaterally and may be related to shallow inflation but adenopathy is not excluded. Follow-up chest x-rays and/or chest CT is recommended as appropriate. Electronically Signed  By: Nolon Nations M.D.   On: 11/24/2016 18:58   Ct Head Code Stroke W/o Cm  Result Date: 11/24/2016 CLINICAL DATA:  Code stroke. Unresponsive. Dialysis patient. Hyperglycemia. Code stroke. EXAM: CT HEAD WITHOUT CONTRAST TECHNIQUE: Contiguous axial images were obtained from the base of the skull through the vertex without intravenous contrast. COMPARISON:  None. FINDINGS: Brain: Moderate generalized brain atrophy. Chronic appearing small vessel ischemic changes. No sign of acute infarction, mass lesion, hemorrhage, hydrocephalus or extra-axial collection. Vascular: Atherosclerotic calcification of the major vessels at the base of the brain. No more peripheral hyperdense vessels. Skull: Negative Sinuses/Orbits: Clear Other: None ASPECTS ([place] Stroke Program Early CT Score) - Ganglionic level infarction (caudate, lentiform nuclei, internal capsule, insula, M1-M3 cortex): 7 - Supraganglionic infarction (M4-M6 cortex): 3 Total score (0-10 with 10 being normal): 10 IMPRESSION: 1. Negative acute code stroke study. Atrophy and chronic small vessel change of the white matter. 2. ASPECTS is 10 Page was placed at the time of interpretation on 11/24/2016 at 6:20 pm. Still awaiting response. Electronically Signed   By: Nelson Chimes M.D.   On: 11/24/2016 18:23    ROS: As per HPI. Patient intubated  Physical Exam: Vitals:   11/25/16 0800 11/25/16 0836 11/25/16 0900 11/25/16 1000  BP: 126/83 119/78 123/79 123/78  Pulse: 93  94 91  Resp: _0 Temp: 99.8 F (37.7 C)     TempSrc: Axillary     SpO2: 100% 99% 99% 100%  Weight:      Height:         General:   Intubated AAM generally healthy appearing Head: no facial edema ETT/OGT in placer Neck: no JVD Lungs: somewhat coarse BS anteriorly Heart: RRR with S1 S2.  Abdomen: soft PD cath in place Lower extremities:without edema or ischemic changes, no open wounds  Neuro: sedated Dialysis Access: PD cath exit site clean and dry; left upper AVF +bruit with IVs in   Dialysis Orders:  CCPD EDW 79kg   7 days a week 6 exchanges 2.5 L with 1 hr 20 min dwell and last fill 2 L - no daytime exchanges left upper AVF Recent outpt labs; hgb 12.9 12/26 iPTH 427 12/26 hgb A1c 9.4 09/12/2016 Outpt BP meds: norvasc 10, hydralazine 100 bid, MTP 100 bid Binders: tums EX 4 tid and calcitriol 0.5   Assessment/Plan: 1. New onset seizures in the setting of hyperglycemia - seen by neuro, post seizure tremulousness noted suggestive of seizure; initial CT negative for acute CVA; rule out infectious source- [facility] pending; check cell count with evening exchange 2. ESRD  With hypokalemia  K 3.4 - supplement K prn-defer to CCM for now  CCPD;resume this evening - half 1.5 and 2.5s; still has functional left upper AVF - need to move IVs to left arm and will need to do BP in leg 3. Hypertension/volume  - no sig volume excess upon exam - BP down now -seems lower than outpt HD BP we have on record though hard to know what they run at baseline and degree of compliance with meds 4. Anemia  - hgb 11.5 - no need for ESA at this time 5. Metabolic bone disease - Ca 8.5 P 3.5 - no binders - on outpt calcitriol 0.5 per day  6. Nutrition - on vital tube feedings - advance to carb mod diet when extubated 7. DM - hyperglycemic upon admission - not well controlled; glucose load with PD can aggravate BSs;  8. Fevers - afebrile upon  admission -most recently 99.8 WBC 8.8 - r/o infectious source - CXR neg upon admission 9. Intubated due to MS/hypoxia prior to intubation - plan to wean when able - [place], PA-C [place] [phone] 11/25/2016, 10:12 AM   Pt seen, examined and agree w A/P as above.  Kelly Splinter MD [employer] pager [phone]   11/25/2016, 4:02 PM

## 2016-11-26 ENCOUNTER — Inpatient Hospital Stay (HOSPITAL_COMMUNITY): Payer: Medicare Other

## 2016-11-26 DIAGNOSIS — N186 End stage renal disease: Secondary | ICD-10-CM | POA: Diagnosis not present

## 2016-11-26 DIAGNOSIS — J9600 Acute respiratory failure, unspecified whether with hypoxia or hypercapnia: Secondary | ICD-10-CM

## 2016-11-26 DIAGNOSIS — I129 Hypertensive chronic kidney disease with stage 1 through stage 4 chronic kidney disease, or unspecified chronic kidney disease: Secondary | ICD-10-CM | POA: Diagnosis not present

## 2016-11-26 DIAGNOSIS — Z992 Dependence on renal dialysis: Secondary | ICD-10-CM | POA: Diagnosis not present

## 2016-11-26 LAB — GLUCOSE, CAPILLARY
GLUCOSE-CAPILLARY: 124 mg/dL — AB (ref 65–99)
GLUCOSE-CAPILLARY: 144 mg/dL — AB (ref 65–99)
GLUCOSE-CAPILLARY: 149 mg/dL — AB (ref 65–99)
GLUCOSE-CAPILLARY: 175 mg/dL — AB (ref 65–99)
GLUCOSE-CAPILLARY: 230 mg/dL — AB (ref 65–99)
GLUCOSE-CAPILLARY: 291 mg/dL — AB (ref 65–99)
GLUCOSE-CAPILLARY: 298 mg/dL — AB (ref 65–99)
Glucose-Capillary: 264 mg/dL — ABNORMAL HIGH (ref 65–99)
Glucose-Capillary: 299 mg/dL — ABNORMAL HIGH (ref 65–99)
Glucose-Capillary: 253 mg/dL — ABNORMAL HIGH (ref 65–99)
Glucose-Capillary: 292 mg/dL — ABNORMAL HIGH (ref 65–99)
Glucose-Capillary: 251 mg/dL — ABNORMAL HIGH (ref 65–99)
Glucose-Capillary: 240 mg/dL — ABNORMAL HIGH (ref 65–99)
Glucose-Capillary: 208 mg/dL — ABNORMAL HIGH (ref 65–99)
Glucose-Capillary: 144 mg/dL — ABNORMAL HIGH (ref 65–99)
Glucose-Capillary: 141 mg/dL — ABNORMAL HIGH (ref 65–99)
Glucose-Capillary: 125 mg/dL — ABNORMAL HIGH (ref 65–99)

## 2016-11-26 LAB — CBC
HEMATOCRIT: 34.2 % — AB (ref 39.0–52.0)
Hemoglobin: 11.2 g/dL — ABNORMAL LOW (ref 13.0–17.0)
MCH: 27 pg (ref 26.0–34.0)
MCHC: 32.7 g/dL (ref 30.0–36.0)
MCV: 82.4 fL (ref 78.0–100.0)
PLATELETS: 151 10*3/uL (ref 150–400)
RBC: 4.15 MIL/uL — ABNORMAL LOW (ref 4.22–5.81)
RDW: 16.8 % — AB (ref 11.5–15.5)
WBC: 9.5 10*3/uL (ref 4.0–10.5)

## 2016-11-26 LAB — COMPREHENSIVE METABOLIC PANEL
ALBUMIN: 1.8 g/dL — AB (ref 3.5–5.0)
ALT: 15 U/L — ABNORMAL LOW (ref 17–63)
ANION GAP: 11 (ref 5–15)
AST: 20 U/L (ref 15–41)
Alkaline Phosphatase: 45 U/L (ref 38–126)
BUN: 80 mg/dL — AB (ref 6–20)
CHLORIDE: 98 mmol/L — AB (ref 101–111)
CO2: 26 mmol/L (ref 22–32)
Calcium: 8.1 mg/dL — ABNORMAL LOW (ref 8.9–10.3)
Creatinine, Ser: 12.08 mg/dL — ABNORMAL HIGH (ref 0.61–1.24)
GFR calc Af Amer: 4 mL/min — ABNORMAL LOW (ref >60)
GFR calc non Af Amer: 4 mL/min — ABNORMAL LOW (ref >60)
GLUCOSE: 313 mg/dL — AB (ref 65–99)
POTASSIUM: 5 mmol/L (ref 3.5–5.1)
SODIUM: 135 mmol/L (ref 135–145)
Total Bilirubin: 0.3 mg/dL (ref 0.3–1.2)
Total Protein: 5.3 g/dL — ABNORMAL LOW (ref 6.5–8.1)

## 2016-11-26 LAB — PHOSPHORUS
PHOSPHORUS: 7.6 mg/dL — AB (ref 2.5–4.6)
Phosphorus: 7.9 mg/dL — ABNORMAL HIGH (ref 2.5–4.6)

## 2016-11-26 LAB — MAGNESIUM
MAGNESIUM: 2.1 mg/dL (ref 1.7–2.4)
Magnesium: 2 mg/dL (ref 1.7–2.4)

## 2016-11-26 MED ORDER — INSULIN ASPART 100 UNIT/ML ~~LOC~~ SOLN
0.0000 [IU] | SUBCUTANEOUS | Status: DC
  Administered 2016-11-26 (×2): 8 [IU] via SUBCUTANEOUS

## 2016-11-26 MED ORDER — MIDAZOLAM HCL 2 MG/2ML IJ SOLN: 2.0000 mg | INTRAMUSCULAR | Status: DC | PRN

## 2016-11-26 MED ORDER — FENTANYL CITRATE (PF) 100 MCG/2ML IJ SOLN
100.0000 ug | INTRAMUSCULAR | Status: DC | PRN
  Administered 2016-11-26 – 2016-11-27 (×2): 100 ug via INTRAVENOUS
  Filled 2016-11-26 (×2): qty 2

## 2016-11-26 MED ORDER — GENTAMICIN SULFATE 0.1 % EX CREA: 1.0000 "application " | TOPICAL_CREAM | Freq: Every day | CUTANEOUS | Status: DC

## 2016-11-26 MED ORDER — SODIUM CHLORIDE 0.9 % IV SOLN
INTRAVENOUS | Status: DC
  Administered 2016-11-26: 2.4 [IU]/h via INTRAVENOUS
  Filled 2016-11-26: qty 2.5

## 2016-11-26 MED ORDER — SODIUM CHLORIDE 0.9 % IV SOLN
1000.0000 mg | INTRAVENOUS | Status: DC
  Administered 2016-11-26 – 2016-11-27 (×2): 1000 mg via INTRAVENOUS
  Filled 2016-11-26 (×2): qty 10

## 2016-11-26 MED ORDER — DELFLEX-LC/4.25% DEXTROSE 483 MOSM/L IP SOLN
INTRAPERITONEAL | Status: DC
  Administered 2016-11-27 (×2): 10000 mL via INTRAPERITONEAL

## 2016-11-26 MED ORDER — GENTAMICIN SULFATE 0.1 % EX CREA
1.0000 "application " | TOPICAL_CREAM | Freq: Every day | CUTANEOUS | Status: DC
  Administered 2016-11-27 – 2016-12-01 (×5): 1 via TOPICAL
  Filled 2016-11-26 (×3): qty 15

## 2016-11-26 MED ORDER — FENTANYL CITRATE (PF) 100 MCG/2ML IJ SOLN: 100.0000 ug | INTRAMUSCULAR | Status: DC | PRN

## 2016-11-26 MED ORDER — DEXMEDETOMIDINE HCL IN NACL 200 MCG/50ML IV SOLN
0.0000 ug/kg/h | INTRAVENOUS | Status: DC
  Administered 2016-11-26 (×3): 0.4 ug/kg/h via INTRAVENOUS
  Administered 2016-11-27: 0.6 ug/kg/h via INTRAVENOUS
  Filled 2016-11-26 (×7): qty 50

## 2016-11-26 MED ORDER — MIDAZOLAM HCL 2 MG/2ML IJ SOLN
2.0000 mg | INTRAMUSCULAR | Status: DC | PRN
  Administered 2016-11-26: 2 mg via INTRAVENOUS
  Filled 2016-11-26: qty 2

## 2016-11-26 MED ORDER — HEPARIN 1000 UNIT/ML FOR PERITONEAL DIALYSIS: 500.0000 [IU] | INTRAMUSCULAR | Status: DC | PRN

## 2016-11-26 MED ORDER — HEPARIN 1000 UNIT/ML FOR PERITONEAL DIALYSIS: 2500.0000 [IU] | INTRAMUSCULAR | Status: DC | PRN

## 2016-11-26 MED ORDER — DELFLEX-LC/2.5% DEXTROSE 394 MOSM/L IP SOLN
INTRAPERITONEAL | Status: DC
  Administered 2016-11-27 (×2): 10000 mL via INTRAPERITONEAL

## 2016-11-26 NOTE — Progress Notes (Signed)
Wasted 30 mL of versed.  Witnessed by Shara Blazing, RN

## 2016-11-26 NOTE — Progress Notes (Signed)
CBG 291 ELINK MD called. Scale changed to moderate sliding scale. Will continue to monitor insulin needs.

## 2016-11-26 NOTE — Progress Notes (Signed)
75 ml Fentanyl gtt wasted in sink witnessed by Beacher May.

## 2016-11-26 NOTE — Progress Notes (Signed)
[facility] Progress Note Patient Name: Jared [name] DOB: [birth year] MRN: [id number]   Date of Service  11/26/2016  HPI/Events of Note  RN calls for versed as pt starting to get agitated on precedex drip. Pt to get MRI  eICU Interventions  Versed ordered.      Intervention Category Intermediate Interventions: Other:  [name] 11/26/2016, 11:32 PM

## 2016-11-26 NOTE — Progress Notes (Signed)
MD made aware of LUE > RUE mov't in response to noxious stimulus. No new orders. Will continue to monitor.

## 2016-11-26 NOTE — Progress Notes (Signed)
[facility] Progress Note   Subjective: on vent, not movign R arm per RN, moving other extremities to pain, on fentanyl now only for sedation  Vitals:   11/26/16 0700 11/26/16 0800 11/26/16 0900 11/26/16 1000  BP: 133/76 (!) 146/83 130/77 134/79  Pulse: 91 88 92 91  Resp: _0 Temp:  97.1 F (36.2 C)    TempSrc:  Axillary    SpO2: 98% 100% 98% 99%  Weight:      Height:        Inpatient medications: . chlorhexidine gluconate (MEDLINE KIT)  15 mL Mouth Rinse BID  . dialysis solution 2.5% low-MG/low-CA   Intraperitoneal Q24H  . dialysis solution 4.25% low-MG/low-CA   Intraperitoneal Q24H  . famotidine  20 mg Oral Daily  . fentaNYL (SUBLIMAZE) injection  50 mcg Intravenous Once  . gentamicin cream  1 application Topical Daily  . heparin  5,000 Units Subcutaneous Q8H  . hydrALAZINE  50 mg Per Tube Q8H  . mouth rinse  15 mL Mouth Rinse 10 times per day   . feeding supplement (VITAL AF 1.2 CAL) 1,000 mL (11/26/16 0700)  . fentaNYL infusion INTRAVENOUS 20 mcg/hr (11/26/16 0700)  . insulin (NOVOLIN-R) infusion 3.9 Units/hr (11/26/16 0930)  . midazolam (VERSED) infusion Stopped (11/25/16 2033)   sodium chloride, fentaNYL, heparin, labetalol, midazolam, midazolam  Exam: ON vent, sedated, not responding +JVD Chest clear bilat RRR no mrg Abd soft no ascites +bs Ext +edema mild x 4 ext  Neuro sedated PD cath intact , dry exit site LUA AVF+bruit  Dialysis: CCDP  79kg edw   6 / 24 hrs   2.5 overnight and 2L day bag   1hr 20 min dwell   No pause  Recent outpt labs; hgb 12.9 12/26 iPTH 427 12/26 hgb A1c 9.4 09/12/2016 Outpt BP meds: norvasc 10, hydralazine 100 bid, MTP 100 bid Binders: tums EX 4 tid and calcitriol 0.5      Assessment: 1. Seizures new onset - on IV Keppra 2. R arm plegia - r/o CVA, for MRI when stable enough 3. ESRD on CCPD 4. HTN getting NG hydral here, BP's good (home norvasc/  mtp on hold) 5. Vol is up 5kg still by wts and prob by  exam 6. Anemia Hb 11's  7. Uncont DM - severe hyperglycemia on admission, better, IV insulin 8. VDRF  Plan - Donnamarie Rossetti with PD 2.5% and 4.25% , get vol down, otherwise stable from renal standpoint   Kelly Splinter MD [place] pager [phone]   11/26/2016, 10:25 AM    Recent Labs Lab 11/24/16 1758 11/24/16 1804 11/24/16 2236  11/25/16 0732 11/25/16 0952 11/25/16 1649 11/26/16 0453  NA 135 135  --   --  138  --   --  135  K 4.6 4.5  --   --  3.4*  --   --  5.0  CL 98* 102  --   --  101  --   --  98*  CO2 12*  --   --   --  24  --   --  26  GLUCOSE 739* >700*  --   --  150*  --   --  313*  BUN 55* 54*  --   --  63*  --   --  80*  CREATININE 11.43* 11.10* 11.39*  --  11.30*  --   --  12.08*  CALCIUM 9.4  --   --   --  8.5*  --   --  8.1*  PHOS  --   --   --   < > 3.5 4.5 6.6* 7.6*  < > = values in this interval not displayed.  Recent Labs Lab 11/24/16 1758 11/26/16 0453  AST 32 20  ALT 22 15*  ALKPHOS 69 45  BILITOT 0.6 0.3  PROT 7.5 5.3*  ALBUMIN 3.1* 1.8*    Recent Labs Lab 11/24/16 1758  11/24/16 2236 11/25/16 0732 11/26/16 0453  WBC 6.7  --  6.1 8.8 9.5  NEUTROABS 3.3  --   --   --   --   HGB 13.6  < > 12.7* 11.5* 11.2*  HCT 43.0  < > 39.2 36.4* 34.2*  MCV 86.9  --  82.9 84.5 82.4  PLT 245  --  248 188 151  < > = values in this interval not displayed. Iron/TIBC/Ferritin/ %Sat No results found for: IRON, TIBC, FERRITIN, IRONPCTSAT

## 2016-11-26 NOTE — Progress Notes (Addendum)
S/O: Some limited arousal to stimuli per nursing. Peritoneal dialysis ongoing.   AM vital signs at time of evaluation: HR 91, R 15, BP 139/99, O2sat 99%  Neurological Exam: Ment: Obtunded. Opens eyes briefly and partially to noxious stimuli.  CN: Sluggish pupils. Weak roving EOM. Face flaccidly symmetric. Neck supple.  Motor/Sensory: No movement of RUE to any stimulus. Weak withdrawal or LUE and bilateral lower extremities to noxious. Semipurposeful movement of LUE.  Reflexes: Hypoactive throughout.  Cerebellar/Gait: Unable to test.  Other: No evidence for clinical seizure activity at time of exam  A/R: 60 y.o.malewith acute onset of unresponsiveness followed by focal seizure activity with subsequent generalization and status epilepticus, now resolved. Severely elevated initial blood sugar of 700 was most likely the precipitant of his seizures.  1. Versed gtt to remain off. Likely that wash-out of this drug will be over an extended period given renal failure and peritoneal dialysis (Versed metabolized by liver but renally excreted).  2. MRI brain to assess for possible left MCA stroke given RUE flaccid weakness.  3. Keppra 1000 mg IV QD to prevent seizure recurrence. Lower overall daily dose given QD is appropriate for peritoneal dialysis patients per pharmacy.  4. Frequent neuro checks.  5. Seizure precautions.   Electronically signed: Dr. Kerney Elbe

## 2016-11-26 NOTE — Progress Notes (Signed)
PULMONARY / CRITICAL CARE MEDICINE   Name: Jared [name] MRN: [id number] DOB: 1956-01-04    ADMISSION DATE:  11/24/2016 CONSULTATION DATE:    REFERRING MD:  EDP  CHIEF COMPLAINT:  Altered mental status, seizure, hyperglycemia  SUBJECTIVE:  Sedated on vent  Versed off   VITAL SIGNS: BP 134/79 (BP Location: Right Arm)   Pulse 91   Temp 97.1 F (36.2 C) (Axillary)   Resp 15   Ht 5\' 10"  (1.778 m)   Wt 185 lb 6.5 oz (84.1 kg) Comment: during dwell of PD  SpO2 99%   BMI 26.60 kg/m   HEMODYNAMICS:    VENTILATOR SETTINGS: Vent Mode: PRVC FiO2 (%):  [40 %] 40 % Set Rate:  [15 bmp] 15 bmp Vt Set:  [580 mL] 580 mL PEEP:  [5 cmH20] 5 cmH20 Plateau Pressure:  [18 cmH20] 18 cmH20  INTAKE / OUTPUT: I/O last 3 completed shifts: In: 2338.3 [I.V.:914.1; NG/GT:1324.3; IV Piggyback:100] Out: 430 [Urine:430]  PHYSICAL EXAMINATION:  General Well nourished, well developed, no apparent distress  HEENT No gross abnormalities. ETT/OGT in place  Pulmonary Clear to auscultation bilaterally with no wheezes, rales or ronchi. Good effort, symmetrical expansion.   Cardiovascular Normal rate, regular rhythm. S1, s2. No m/r/g. Distal pulses palpable.  Abdomen Still Soft, non-tender, distended, positive bowel sounds, no palpable organomegaly or masses. Normoresonant to percussion. PD cathter insertion site clean and dry, no exudate or erythema  Musculoskeletal Grossly normal  Lymphatics No cervical, supraclavicular or axillary adenopathy.   Neurologic Sedated currently but was agitated and reaching for tubes. Was slower to respond w/ RUE earlier but now starting to move this as well   Skin/Integuement No rash, no cyanosis, no clubbing.      LABS:  BMET  Recent Labs Lab 11/24/16 1758 11/24/16 1804 11/24/16 2236 11/25/16 0732 11/26/16 0453  NA 135 135  --  138 135  K 4.6 4.5  --  3.4* 5.0  CL 98* 102  --  101 98*  CO2 12*  --   --  24 26  BUN 55* 54*  --  63* 80*  CREATININE  11.43* 11.10* 11.39* 11.30* 12.08*  GLUCOSE 739* >700*  --  150* 313*    Electrolytes  Recent Labs Lab 11/24/16 1758  11/25/16 0732 11/25/16 0952 11/25/16 1649 11/26/16 0453  CALCIUM 9.4  --  8.5*  --   --  8.1*  MG  --   < > 1.9 1.9 1.8 2.1  PHOS  --   < > 3.5 4.5 6.6* 7.6*  < > = values in this interval not displayed.  CBC  Recent Labs Lab 11/24/16 2236 11/25/16 0732 11/26/16 0453  WBC 6.1 8.8 9.5  HGB 12.7* 11.5* 11.2*  HCT 39.2 36.4* 34.2*  PLT 248 188 151    Coag's  Recent Labs Lab 11/24/16 1758  APTT 32  INR 1.17    Sepsis Markers  Recent Labs Lab 11/24/16 1809  LATICACIDVEN 12.94*    ABG  Recent Labs Lab 11/24/16 2247 11/25/16 1028  PHART 7.375 7.424  PCO2ART 32.4 38.0  PO2ART 55.0* 105    Liver Enzymes  Recent Labs Lab 11/24/16 1758 11/26/16 0453  AST 32 20  ALT 22 15*  ALKPHOS 69 45  BILITOT 0.6 0.3  ALBUMIN 3.1* 1.8*    Cardiac Enzymes No results for input(s): TROPONINI, PROBNP in the last 168 hours.  Glucose  Recent Labs Lab 11/25/16 2034 11/26/16 0014 11/26/16 0403 11/26/16 0738 11/26/16 0929 11/26/16 [place]  230* 291* 264* 299* 253* 298*    Imaging Dg Chest Port 1 View  Result Date: 11/26/2016 CLINICAL DATA:  Atelectasis. EXAM: PORTABLE CHEST 1 VIEW COMPARISON:  Radiograph November 24, 2016. FINDINGS: Nasogastric and endotracheal tubes are unchanged in position. No pneumothorax is noted. Hypoinflation of the lungs is noted with increased bibasilar opacities most consistent with atelectasis or infiltrates. Bony thorax is unremarkable. IMPRESSION: Stable support apparatus. Hypoinflation of the lungs with increased bibasilar atelectasis or infiltrates. Electronically Signed   By: Marijo Conception, M.D.   On: 11/26/2016 07:22   STUDIES:  MRI brain 12/31>>>  CULTURES: none  ANTIBIOTICS: none  SIGNIFICANT EVENTS: 12/29 admitted. High dose versed gtt for status 12/31 versed gtt off. Waking up. Agitated  so precedex started for weaning   LINES/TUBES: PIV ETT 12/29 >>  DISCUSSION: Mr. Krotz is a 61M with PMH significant for IDDM, ESRD on PD, HTN, who presented with concern for acute stroke and had witnessed seizures in the ED. CT and CTA of the head were negative for signs of stroke. The thought is that profound hyperglycemia vs hypertensive encephalopathy precipitated his seizure. He was also markedly hypertensive - it is unclear what the underlying etiology for his acute hypertensive crisis and hyperglycemia is - there is no suggestion of an infectious process at this time. Spot EEG was negative for active seizure activity. Now off versed gtt and waking up but agitated and right arm weaker.  For today: -switch to preceded  -MRI brain; then SBT when MS allows -off keppra; keeping versed PRN for seizure -cont insulin gtt for glycemic control -supportive care: start nutrition, PD, and replace K  ASSESSMENT / PLAN:  PULMONARY A: Need for intubation 2/2 mental status Bibasilar atx and hypoxia prior to intubation  P:   Continue ventilatory support Initiate PSV SBT when able after MRI  CARDIOVASCULAR A:  Hypertension - improved P:  Continue sedation/analgesia Resume home BP meds as able (no change for today)   RENAL A:   ESRD on peritoneal dialysis - no acute need for dialysis Elevated lactate (likely 2/2 seizure) P:   Cont PD per nephro  Monitor lytes  GASTROINTESTINAL A:   No acute issues P:   NPO Stress ulcer ppx Cont tubefeeds  HEMATOLOGIC A:   No acute issues P:  Monitor  INFECTIOUS A:   No overt signs or symptoms of infection P:   For completeness sake, check UA and blood cultures  ENDOCRINE A:   IDDM w/ marked hyperglycemia P:   Insulin gtt  NEUROLOGIC A:   Seizure, likely 2/2 hyperglycemia Residual encephalopathy ? Stroke given right sided weakness >EEG 12/29 negative for seizure activity  > now off versed gtt P:   RASS goal: 0 Dc fent,  add precedex MRI brain ordered  Versed for seizure only (PRN) Off keppra    FAMILY  - Updates: brother-in-law and son at bedside. Son is next of kin, but patient lives with sister, who is very involved in his care. Consensus is that patient would wish to be FULL CODE.   - Inter-disciplinary family meet or Palliative Care meeting due by:  day 7  My critical care time 30 minutes  Erick Colace ACNP-BC [place] Pager # [phone] OR # [phone] if no answer 11/26/2016, 11:11 AM  Attending Note:  I have examined patient, reviewed labs, studies and notes. I have discussed the case with Jerrye Bushy, and I agree with the data and plans as amended above. 60 yo  man, hx DM, CKD on PD, HTN, suffered an acute CVA with seizures. Required intubation and deep sedation to break seizures. On eval today he remains sedated but turned head to voice, comfortable on MV, coarse breath sounds. He is being empirically treated for possible infxn as a precipitant of seizures. Also correcting glucose and BP. Plan to  push SBT and work for extubation now that sedation is being lightened. Appreciate Renal's assistance with PD. Independent critical care time is 32 minutes.   Baltazar Apo, MD, PhD 11/26/2016, 3:15 PM [facility] [phone] or if no answer [phone]

## 2016-11-26 NOTE — Consult Note (Signed)
[facility] Renal Consultation Note    Indication for Consultation:  Management of ESRD/hemodialysis; anemia, hypertension/volume and secondary hyperparathyroidism Primary Nephrologist: Jamal Maes, MD  HPI: Jared [name] is a 60 y.o. 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  He dialyzed at [facility] before transitioning to PD February 2016 and is followed by Dr. Jamal Maes who last saw him 12/20 at his routine monthly appointment. At that time home BP per her not was variable, but generally ok. Home PD has been going well.He reported some insurance issues and variable compliance with calcitriol. Kinetics and Hgb have been good, iPTH is elevated.  P variable but better lately BS tend to run high - last hgb A1C was 9.4 in October.  He is seen today following admission for AMS, seizure and hyperglycemia following admission last pm.  Per H and P he had an acute change in MS about 1700 12/29, complained of a HA and stopped talking.  He was triaged as a code stroke and began seizing upon arrival to the ED. Seizure was aborted with IV ativan in the ED. He was intubated and BS was >600. BP was markedly elevated. CT and CTA were neg for acute findings. Neuro evaluated and felt seizure was related to elevated BS.  I did speak to his brother-in-law who is in the room, whom he lives with who noted that he otherwise had been feeling well prior to this event without N, V, D.  He also reported that the patient, who works for [employer] drove to and from [place] about 1 - 4 pm yesterday on work related issue. Per the same, the patient sometimes does his CCPD during the day. He does not know if the patient has a PCP.  Past Medical History:  Diagnosis Date  . Anemia   . Chronic kidney disease    on dialysis M-W-F  . Diabetes mellitus without complication ([place])    onset as adult  . Hypertension   . Thyroid disease     hyperparathyroidism   Past Surgical History:  Procedure Laterality Date  . AV FISTULA PLACEMENT Left 06/04/2014   Procedure: ARTERIOVENOUS (AV) FISTULA CREATION-  BRACHIOCEPHALIC WITH LIGATION OF COMPETEING BRANCH;  Surgeon: Mal Misty, MD;  Location: [facility];  Service: Vascular;  Laterality: Left;  . INSERTION OF DIALYSIS CATHETER  04/2014   Family History  Problem Relation Age of Onset  . Cancer - Lung Mother   . Cancer - Other Father    Social History:  reports that he quit smoking about 32 years ago. He has never used smokeless tobacco. He reports that he drinks alcohol. He reports that he does not use drugs. Allergies  Allergen Reactions  . Penicillins Other (See Comments)    Has patient had a PCN reaction causing immediate rash, facial/tongue/throat swelling, SOB or lightheadedness with hypotension: Unk Has patient had a PCN reaction causing severe rash involving mucus membranes or skin necrosis: Unk Has patient had a PCN reaction that required hospitalization: Unk Has patient had a PCN reaction occurring within the last 10 years: Unk If all of the above answers are "NO", then may proceed with Cephalosporin use.    Prior to Admission medications   Medication Sig Start Date End Date Taking? Authorizing Provider  amLODipine (NORVASC) 10 MG tablet Take 10 mg by mouth daily.    Historical Provider, MD  b complex-vitamin c-folic acid (NEPHRO-VITE) 0.8 MG TABS tablet Take  1 tablet by mouth at bedtime.    Historical Provider, MD  calcitRIOL (ROCALTROL) 0.5 MCG capsule Take 0.5 mcg by mouth daily.    Historical Provider, MD  cinacalcet (SENSIPAR) 30 MG tablet Take 30 mg by mouth daily.    Historical Provider, MD  hydrALAZINE (APRESOLINE) 100 MG tablet Take 100 mg by mouth 3 (three) times daily.    Historical Provider, MD  Insulin NPH Human, Isophane, (NOVOLIN N [place]) Inject 4-19 Units into the skin 2 (two) times daily.     Historical Provider, MD  insulin regular (NOVOLIN R,HUMULIN R) 100  units/mL injection Inject 2-6 Units into the skin 2 (two) times daily before a meal.     Historical Provider, MD  metoprolol (LOPRESSOR) 100 MG tablet Take 100 mg by mouth 2 (two) times daily.     Historical Provider, MD  oxyCODONE (ROXICODONE) 5 MG immediate release tablet Take 1 tablet (5 mg total) by mouth every 6 (six) hours as needed for severe pain. 06/04/14   Samantha J Rhyne, PA-C  sevelamer carbonate (RENVELA) 800 MG tablet Take 800 mg by mouth 3 (three) times daily with meals.    Historical Provider, MD   Current Facility-Administered Medications  Medication Dose Route Frequency Provider Last Rate Last Dose  . 0.9 %  sodium chloride infusion  250 mL Intravenous PRN Dannielle Burn, MD      . chlorhexidine gluconate (MEDLINE KIT) (PERIDEX) 0.12 % solution 15 mL  15 mL Mouth Rinse BID Rigoberto Noel, MD   15 mL at 11/26/16 0800  . dialysis solution 1.5% low-MG/low-CA SOLN   Intraperitoneal Q24H Alric Seton, PA-C   10,000 mL at 11/26/16 0202  . dialysis solution 2.5% low-MG/low-CA dianeal solution   Intraperitoneal Q24H Alric Seton, PA-C   5,000 mL at 11/26/16 0201  . famotidine (PEPCID) tablet 20 mg  20 mg Oral Daily Collene Gobble, MD   20 mg at 11/25/16 1133  . feeding supplement (VITAL AF 1.2 CAL) liquid 1,000 mL  1,000 mL Per Tube Continuous Collene Gobble, MD 65 mL/hr at 11/26/16 0700 1,000 mL at 11/26/16 0700  . fentaNYL (SUBLIMAZE) bolus via infusion 50 mcg  50 mcg Intravenous Q1H PRN Kerney Elbe, MD      . fentaNYL (SUBLIMAZE) injection 50 mcg  50 mcg Intravenous Once Kerney Elbe, MD      . fentaNYL 2595mg in NS 2587m(1066mml) infusion-PREMIX  25-400 mcg/hr Intravenous Continuous RobCollene GobbleD 2 mL/hr at 11/26/16 0700 20 mcg/hr at 11/26/16 0700  . gentamicin cream (GARAMYCIN) 0.1 % 1 application  1 application Topical Daily MarAlric SetonA-C   1 application at 12/67/10/4529  . heparin 1000 unit/ml injection 2,500 Units  2,500 Units Intraperitoneal PRN MarAlric SetonA-C      . heparin injection 5,000 Units  5,000 Units Subcutaneous Q8H SarDannielle BurnD   5,000 Units at 11/26/16 [phone] hydrALAZINE (APRESOLINE) tablet 50 mg  50 mg Per Tube Q8H PetErick ColaceP   50 mg at 11/26/16 [phone] insulin regular (NOVOLIN R,HUMULIN R) 250 Units in sodium chloride 0.9 % 250 mL (1 Units/mL) infusion   Intravenous Continuous PetErick ColaceP 3.9 mL/hr at 11/26/16 0930 3.9 Units/hr at 11/26/16 0930  . labetalol (NORMODYNE,TRANDATE) injection 20 mg  20 mg Intravenous Q10 min PRN SarDannielle BurnD   20 mg at 11/26/16 0631  . MEDLINE mouth rinse  15 mL Mouth Rinse 10 times per day  Rigoberto Noel, MD   15 mL at 11/26/16 0617  . midazolam (VERSED) 50 mg in sodium chloride 0.9 % 50 mL (1 mg/mL) infusion  0-10 mg/hr Intravenous Continuous Tobie Poet, DO   Stopped at 11/25/16 2033  . midazolam (VERSED) bolus via infusion 1-2 mg  1-2 mg Intravenous Q2H PRN Kerney Elbe, MD      . midazolam (VERSED) bolus via infusion 2 mg  2 mg Intravenous PRN Kerney Elbe, MD       Labs: Basic Metabolic Panel:  Recent Labs Lab 11/24/16 1758 11/24/16 1804 11/24/16 2236  11/25/16 0732 11/25/16 0952 11/25/16 1649 11/26/16 0453  NA 135 135  --   --  138  --   --  135  K 4.6 4.5  --   --  3.4*  --   --  5.0  CL 98* 102  --   --  101  --   --  98*  CO2 12*  --   --   --  24  --   --  26  GLUCOSE 739* >700*  --   --  150*  --   --  313*  BUN 55* 54*  --   --  63*  --   --  80*  CREATININE 11.43* 11.10* 11.39*  --  11.30*  --   --  12.08*  CALCIUM 9.4  --   --   --  8.5*  --   --  8.1*  PHOS  --   --   --   < > 3.5 4.5 6.6* 7.6*  < > = values in this interval not displayed. Liver Function Tests:  Recent Labs Lab 11/24/16 1758 11/26/16 0453  AST 32 20  ALT 22 15*  ALKPHOS 69 45  BILITOT 0.6 0.3  PROT 7.5 5.3*  ALBUMIN 3.1* 1.8*   CBC:  Recent Labs Lab 11/24/16 1758  11/24/16 2236 11/25/16 0732 11/26/16 0453  WBC 6.7  --  6.1 8.8 9.5  NEUTROABS  3.3  --   --   --   --   HGB 13.6  < > 12.7* 11.5* 11.2*  HCT 43.0  < > 39.2 36.4* 34.2*  MCV 86.9  --  82.9 84.5 82.4  PLT 245  --  248 188 151  < > = values in this interval not displayed. CBG:  Recent Labs Lab 11/25/16 2034 11/26/16 0014 11/26/16 0403 11/26/16 0738 11/26/16 0929  GLUCAP 230* 291* 264* 299* 253*   Studies/Results: Ct Angio Head W Or Wo Contrast  Result Date: 11/24/2016 CLINICAL DATA:  Dialysis patient. Diabetes. Unresponsive. Negative acute head CT. EXAM: CT ANGIOGRAPHY HEAD AND NECK TECHNIQUE: Multidetector CT imaging of the head and neck was performed using the standard protocol during bolus administration of intravenous contrast. Multiplanar CT image reconstructions and MIPs were obtained to evaluate the vascular anatomy. Carotid stenosis measurements (when applicable) are obtained utilizing NASCET criteria, using the distal internal carotid diameter as the denominator. CONTRAST:  50 cc Isovue 370 COMPARISON:  Earlier same day CT FINDINGS: CTA NECK FINDINGS Aortic arch: Mild atherosclerotic change. No aneurysm or dissection. Common origin of the innominate artery and left common carotid artery. Right carotid system: Common carotid artery is widely patent to the bifurcation. The carotid bifurcation does not show atherosclerotic change. No narrowing or irregularity. Cervical ICA is normal. Left carotid system: Common carotid artery is widely patent to the bifurcation. The carotid bifurcation does not show atherosclerotic change. No narrowing or irregularity. Cervical  ICA is normal. Vertebral arteries: Both vertebral artery origins are widely patent. Minimal atherosclerotic calcification at the left vertebral artery origin. Both vertebral arteries are widely patent through the cervical region to the foramen magnum. Skeleton: Negative Other neck: No significant soft tissue finding. Patient is intubated. Upper chest: Patchy dependent atelectasis and are infiltrate bilaterally.  Possible aspiration or community acquired pneumonia. Review of the MIP images confirms the above findings CTA HEAD FINDINGS Anterior circulation: Both internal carotid arteries are widely patent through the skullbase and siphon regions. Mild peripheral siphon calcification without stenosis. Anterior and middle cerebral vessels are patent without stenosis, aneurysm or vascular malformation. No missing branch vessels are identified. Posterior circulation: Both vertebral arteries are widely patent to the basilar. Mild calcification at the foramen magnum level without stenosis. No vertebral stenosis. Posterior circulation branch vessels are normal. Venous sinuses: Patent and normal Anatomic variants: None significant Delayed phase: No abnormal enhancement Review of the MIP images confirms the above findings IMPRESSION: CT angiography is negative for large vessel occlusion. Carotid bifurcations are normal. Only very minimal atherosclerotic change is demonstrated in the region imaged. These results were called by telephone at the time of interpretation on 11/24/2016 at 6:40 pm to Dr. Caryl Pina , who verbally acknowledged these results. Initial code stroke head CT results also communicated at this time. Electronically Signed   By: Paulina Fusi M.D.   On: 11/24/2016 18:47   Ct Angio Neck W Or Wo Contrast  Result Date: 11/24/2016 CLINICAL DATA:  Dialysis patient. Diabetes. Unresponsive. Negative acute head CT. EXAM: CT ANGIOGRAPHY HEAD AND NECK TECHNIQUE: Multidetector CT imaging of the head and neck was performed using the standard protocol during bolus administration of intravenous contrast. Multiplanar CT image reconstructions and MIPs were obtained to evaluate the vascular anatomy. Carotid stenosis measurements (when applicable) are obtained utilizing NASCET criteria, using the distal internal carotid diameter as the denominator. CONTRAST:  50 cc Isovue 370 COMPARISON:  Earlier same day CT FINDINGS: CTA NECK  FINDINGS Aortic arch: Mild atherosclerotic change. No aneurysm or dissection. Common origin of the innominate artery and left common carotid artery. Right carotid system: Common carotid artery is widely patent to the bifurcation. The carotid bifurcation does not show atherosclerotic change. No narrowing or irregularity. Cervical ICA is normal. Left carotid system: Common carotid artery is widely patent to the bifurcation. The carotid bifurcation does not show atherosclerotic change. No narrowing or irregularity. Cervical ICA is normal. Vertebral arteries: Both vertebral artery origins are widely patent. Minimal atherosclerotic calcification at the left vertebral artery origin. Both vertebral arteries are widely patent through the cervical region to the foramen magnum. Skeleton: Negative Other neck: No significant soft tissue finding. Patient is intubated. Upper chest: Patchy dependent atelectasis and are infiltrate bilaterally. Possible aspiration or community acquired pneumonia. Review of the MIP images confirms the above findings CTA HEAD FINDINGS Anterior circulation: Both internal carotid arteries are widely patent through the skullbase and siphon regions. Mild peripheral siphon calcification without stenosis. Anterior and middle cerebral vessels are patent without stenosis, aneurysm or vascular malformation. No missing branch vessels are identified. Posterior circulation: Both vertebral arteries are widely patent to the basilar. Mild calcification at the foramen magnum level without stenosis. No vertebral stenosis. Posterior circulation branch vessels are normal. Venous sinuses: Patent and normal Anatomic variants: None significant Delayed phase: No abnormal enhancement Review of the MIP images confirms the above findings IMPRESSION: CT angiography is negative for large vessel occlusion. Carotid bifurcations are normal. Only very minimal atherosclerotic change is  demonstrated in the region imaged. These results  were called by telephone at the time of interpretation on 11/24/2016 at 6:40 pm to Dr. Kerney Elbe , who verbally acknowledged these results. Initial code stroke head CT results also communicated at this time. Electronically Signed   By: Nelson Chimes M.D.   On: 11/24/2016 18:47   Dg Chest Port 1 View  Result Date: 11/26/2016 CLINICAL DATA:  Atelectasis. EXAM: PORTABLE CHEST 1 VIEW COMPARISON:  Radiograph November 24, 2016. FINDINGS: Nasogastric and endotracheal tubes are unchanged in position. No pneumothorax is noted. Hypoinflation of the lungs is noted with increased bibasilar opacities most consistent with atelectasis or infiltrates. Bony thorax is unremarkable. IMPRESSION: Stable support apparatus. Hypoinflation of the lungs with increased bibasilar atelectasis or infiltrates. Electronically Signed   By: Marijo Conception, M.D.   On: 11/26/2016 07:22   Dg Chest Portable 1 View  Result Date: 11/24/2016 CLINICAL DATA:  Status post intubation. EXAM: PORTABLE CHEST 1 VIEW COMPARISON:  06/04/2014 FINDINGS: Endotracheal tube is in place with tip approximately 3.1 cm above the carina. Nasogastric tube is in place with tip off the image beyond the gastroesophageal junction. Shallow lung inflation. Heart size is accentuated by the portable technique. There is right perihilar atelectasis. Perihilar fullness may be related to shallow lung inflation. However adenopathy cannot be excluded. IMPRESSION: 1. Status post intubation and placement of nasogastric tube. 2. Right perihilar atelectasis. 3. Perihilar fullness bilaterally and may be related to shallow inflation but adenopathy is not excluded. Follow-up chest x-rays and/or chest CT is recommended as appropriate. Electronically Signed   By: Nolon Nations M.D.   On: 11/24/2016 18:58   Ct Head Code Stroke W/o Cm  Result Date: 11/24/2016 CLINICAL DATA:  Code stroke. Unresponsive. Dialysis patient. Hyperglycemia. Code stroke. EXAM: CT HEAD WITHOUT CONTRAST  TECHNIQUE: Contiguous axial images were obtained from the base of the skull through the vertex without intravenous contrast. COMPARISON:  None. FINDINGS: Brain: Moderate generalized brain atrophy. Chronic appearing small vessel ischemic changes. No sign of acute infarction, mass lesion, hemorrhage, hydrocephalus or extra-axial collection. Vascular: Atherosclerotic calcification of the major vessels at the base of the brain. No more peripheral hyperdense vessels. Skull: Negative Sinuses/Orbits: Clear Other: None ASPECTS ([place] Stroke Program Early CT Score) - Ganglionic level infarction (caudate, lentiform nuclei, internal capsule, insula, M1-M3 cortex): 7 - Supraganglionic infarction (M4-M6 cortex): 3 Total score (0-10 with 10 being normal): 10 IMPRESSION: 1. Negative acute code stroke study. Atrophy and chronic small vessel change of the white matter. 2. ASPECTS is 10 Page was placed at the time of interpretation on 11/24/2016 at 6:20 pm. Still awaiting response. Electronically Signed   By: Nelson Chimes M.D.   On: 11/24/2016 18:23    ROS: As per HPI. Patient intubated  Physical Exam: Vitals:   11/26/16 0640 11/26/16 0700 11/26/16 0800 11/26/16 0900  BP: 138/81 133/76 (!) 146/83 130/77  Pulse: 88 91 88 92  Resp: '15 15 15 15  '$ Temp:   97.1 F (36.2 C)   TempSrc:   Axillary   SpO2: 97% 98% 100% 98%  Weight:      Height:         General:  Intubated AAM generally healthy appearing Head: no facial edema ETT/OGT in placer Neck: no JVD Lungs: somewhat coarse BS anteriorly Heart: RRR with S1 S2.  Abdomen: soft PD cath in place Lower extremities:without edema or ischemic changes, no open wounds  Neuro: sedated Dialysis Access: PD cath exit site clean and dry; left  upper AVF +bruit with IVs in   Dialysis Orders:  CCPD EDW 79kg   7 days a week 6 exchanges 2.5 L with 1 hr 20 min dwell and last fill 2 L - no daytime exchanges left upper AVF Recent outpt labs; hgb 12.9 12/26 iPTH 427 12/26 hgb A1c  9.4 09/12/2016 Outpt BP meds: norvasc 10, hydralazine 100 bid, MTP 100 bid Binders: tums EX 4 tid and calcitriol 0.5   Assessment/Plan: 1. New onset seizures in the setting of hyperglycemia - seen by neuro, post seizure tremulousness noted suggestive of seizure; initial CT negative for acute CVA; rule out infectious source- [facility] pending; check cell count with evening exchange 2. ESRD  With hypokalemia  K 3.4 - supplement K prn-defer to CCM for now  CCPD;resume this evening - half 1.5 and 2.5s; still has functional left upper AVF - need to move IVs to left arm and will need to do BP in leg 3. Hypertension/volume  - no sig volume excess upon exam - BP down now -seems lower than outpt HD BP we have on record though hard to know what they run at baseline and degree of compliance with meds 4. Anemia  - hgb 11.5 - no need for ESA at this time 5. Metabolic bone disease - Ca 8.5 P 3.5 - no binders - on outpt calcitriol 0.5 per day  6. Nutrition - on vital tube feedings - advance to carb mod diet when extubated 7. DM - hyperglycemic upon admission - not well controlled; glucose load with PD can aggravate BSs;  8. Fevers - afebrile upon admission -most recently 99.8 WBC 8.8 - r/o infectious source - CXR neg upon admission 9. Intubated due to MS/hypoxia prior to intubation - plan to wean when able - [place], PA-C [facility] [phone] 11/25/2016, 10:12 AM   Pt seen, examined and agree w A/P as above.  Kelly Splinter MD [employer] pager [phone]   11/25/2016, 11:45 AM

## 2016-11-27 ENCOUNTER — Inpatient Hospital Stay (HOSPITAL_COMMUNITY): Payer: Medicare Other

## 2016-11-27 DIAGNOSIS — J9600 Acute respiratory failure, unspecified whether with hypoxia or hypercapnia: Secondary | ICD-10-CM

## 2016-11-27 LAB — COMPREHENSIVE METABOLIC PANEL
ALBUMIN: 1.7 g/dL — AB (ref 3.5–5.0)
ALT: 13 U/L — ABNORMAL LOW (ref 17–63)
ANION GAP: 11 (ref 5–15)
AST: 15 U/L (ref 15–41)
Alkaline Phosphatase: 50 U/L (ref 38–126)
BILIRUBIN TOTAL: 0.5 mg/dL (ref 0.3–1.2)
BUN: 69 mg/dL — ABNORMAL HIGH (ref 6–20)
CALCIUM: 7.9 mg/dL — AB (ref 8.9–10.3)
CO2: 26 mmol/L (ref 22–32)
Chloride: 99 mmol/L — ABNORMAL LOW (ref 101–111)
Creatinine, Ser: 10.72 mg/dL — ABNORMAL HIGH (ref 0.61–1.24)
GFR, EST AFRICAN AMERICAN: 5 mL/min — AB (ref >60)
GFR, EST NON AFRICAN AMERICAN: 4 mL/min — AB (ref >60)
GLUCOSE: 299 mg/dL — AB (ref 65–99)
Potassium: 5.2 mmol/L — ABNORMAL HIGH (ref 3.5–5.1)
Sodium: 136 mmol/L (ref 135–145)
TOTAL PROTEIN: 5.4 g/dL — AB (ref 6.5–8.1)

## 2016-11-27 LAB — URINALYSIS, ROUTINE W REFLEX MICROSCOPIC
BILIRUBIN URINE: NEGATIVE
Glucose, UA: >=500 mg/dL — AB
KETONES UR: NEGATIVE mg/dL
NITRITE: NEGATIVE
SPECIFIC GRAVITY, URINE: 1.006 (ref 1.005–1.030)
SQUAMOUS EPITHELIAL / LPF: NONE SEEN
pH: 8 (ref 5.0–8.0)

## 2016-11-27 LAB — GLUCOSE, CAPILLARY
GLUCOSE-CAPILLARY: 60 mg/dL — AB (ref 65–99)
GLUCOSE-CAPILLARY: 95 mg/dL (ref 65–99)
GLUCOSE-CAPILLARY: 125 mg/dL — AB (ref 65–99)
GLUCOSE-CAPILLARY: 222 mg/dL — AB (ref 65–99)
GLUCOSE-CAPILLARY: 362 mg/dL — AB (ref 65–99)
GLUCOSE-CAPILLARY: 318 mg/dL — AB (ref 65–99)
GLUCOSE-CAPILLARY: 178 mg/dL — AB (ref 65–99)
Glucose-Capillary: 107 mg/dL — ABNORMAL HIGH (ref 65–99)
Glucose-Capillary: 71 mg/dL (ref 65–99)
Glucose-Capillary: 303 mg/dL — ABNORMAL HIGH (ref 65–99)

## 2016-11-27 LAB — CBC
HEMATOCRIT: 32.6 % — AB (ref 39.0–52.0)
Hemoglobin: 10.8 g/dL — ABNORMAL LOW (ref 13.0–17.0)
MCH: 26.9 pg (ref 26.0–34.0)
MCHC: 33.1 g/dL (ref 30.0–36.0)
MCV: 81.1 fL (ref 78.0–100.0)
Platelets: 173 10*3/uL (ref 150–400)
RBC: 4.02 MIL/uL — ABNORMAL LOW (ref 4.22–5.81)
RDW: 16.6 % — AB (ref 11.5–15.5)
WBC: 8.6 10*3/uL (ref 4.0–10.5)

## 2016-11-27 LAB — AMMONIA: Ammonia: 49 umol/L — ABNORMAL HIGH (ref 9–35)

## 2016-11-27 LAB — RAPID URINE DRUG SCREEN, HOSP PERFORMED
Amphetamines: NOT DETECTED
Barbiturates: NOT DETECTED
Benzodiazepines: POSITIVE — AB
COCAINE: NOT DETECTED
OPIATES: NOT DETECTED
TETRAHYDROCANNABINOL: NOT DETECTED

## 2016-11-27 MED ORDER — DEXTROSE 50 % IV SOLN
INTRAVENOUS | Status: AC
  Administered 2016-11-27: 25 mL
  Filled 2016-11-27: qty 50

## 2016-11-27 MED ORDER — INSULIN ASPART 100 UNIT/ML ~~LOC~~ SOLN
1.0000 [IU] | SUBCUTANEOUS | Status: DC
  Administered 2016-11-27 – 2016-11-28 (×6): 1 [IU] via SUBCUTANEOUS
  Administered 2016-11-28: via SUBCUTANEOUS

## 2016-11-27 MED ORDER — HYDRALAZINE HCL 50 MG PO TABS: 50.0000 mg | ORAL_TABLET | Freq: Three times a day (TID) | ORAL | Status: DC

## 2016-11-27 MED ORDER — INSULIN ASPART 100 UNIT/ML ~~LOC~~ SOLN
0.0000 [IU] | SUBCUTANEOUS | Status: DC
  Administered 2016-11-27: 3 [IU] via SUBCUTANEOUS
  Administered 2016-11-27: 11 [IU] via SUBCUTANEOUS
  Administered 2016-11-27: 15 [IU] via SUBCUTANEOUS
  Administered 2016-11-27: 2 [IU] via SUBCUTANEOUS
  Administered 2016-11-27 – 2016-11-28 (×3): 11 [IU] via SUBCUTANEOUS
  Administered 2016-11-28: 3 [IU] via SUBCUTANEOUS

## 2016-11-27 MED ORDER — ORAL CARE MOUTH RINSE
15.0000 mL | Freq: Two times a day (BID) | OROMUCOSAL | Status: DC
  Administered 2016-11-27 – 2016-11-30 (×6): 15 mL via OROMUCOSAL

## 2016-11-27 NOTE — Progress Notes (Signed)
PULMONARY / CRITICAL CARE MEDICINE   Name: Jared [name] MRN: [id number] DOB: 1956-10-23    ADMISSION DATE:  11/24/2016 CONSULTATION DATE:    REFERRING MD:  EDP  CHIEF COMPLAINT:  Altered mental status, seizure, hyperglycemia  SUBJECTIVE:  extubated  VITAL SIGNS: BP 111/71   Pulse 75   Temp 97.5 F (36.4 C) (Axillary)   Resp 15   Ht 5\' 10"  (1.778 m)   Wt 185 lb 6.5 oz (84.1 kg) Comment: during dwell of PD  SpO2 96%   BMI 26.60 kg/m   HEMODYNAMICS:    VENTILATOR SETTINGS: Vent Mode: PRVC FiO2 (%):  [40 %] 40 % Set Rate:  [15 bmp] 15 bmp Vt Set:  [580 mL] 580 mL PEEP:  [5 cmH20] 5 cmH20 Plateau Pressure:  [18 cmH20-21 cmH20] 19 cmH20  INTAKE / OUTPUT: I/O last 3 completed shifts: In: 4814.9 [I.V.:369.9; Other:2000; WU/JW:1191; IV Piggyback:110] Out: 2779 [Urine:250; Other:2529]  PHYSICAL EXAMINATION:  General Well nourished, well developed, no apparent distress, but somnolent s/p extubation   HEENT No gross abnormalities.  Pulmonary Clear to auscultation bilaterally with no wheezes, rales or ronchi. Cough strong    Cardiovascular Normal rate, regular rhythm. S1, s2. No m/r/g. Distal pulses palpable.  Abdomen Still Soft, non-tender, distended, positive bowel sounds, no palpable organomegaly or masses. Normoresonant to percussion. PD cathter insertion site clean and dry, no exudate or erythema  Musculoskeletal Grossly normal  Lymphatics No cervical, supraclavicular or axillary adenopathy.   Neurologic Slow to wake up. Will track, follows commands, right side weak  Skin/Integuement No rash, no cyanosis, no clubbing.      LABS:  BMET  Recent Labs Lab 11/24/16 1758 11/24/16 1804 11/24/16 2236 11/25/16 0732 11/26/16 0453  NA 135 135  --  138 135  K 4.6 4.5  --  3.4* 5.0  CL 98* 102  --  101 98*  CO2 12*  --   --  24 26  BUN 55* 54*  --  63* 80*  CREATININE 11.43* 11.10* 11.39* 11.30* 12.08*  GLUCOSE 739* >700*  --  150* 313*     Electrolytes  Recent Labs Lab 11/24/16 1758 11/25/16 0732  11/25/16 1649 11/26/16 0453 11/26/16 1643  CALCIUM 9.4 8.5*  --   --  8.1*  --   MG  --  1.9  < > 1.8 2.1 2.0  PHOS  --  3.5  < > 6.6* 7.6* 7.9*  < > = values in this interval not displayed.  CBC  Recent Labs Lab 11/24/16 2236 11/25/16 0732 11/26/16 0453  WBC 6.1 8.8 9.5  HGB 12.7* 11.5* 11.2*  HCT 39.2 36.4* 34.2*  PLT 248 188 151    Coag's  Recent Labs Lab 11/24/16 1758  APTT 32  INR 1.17    Sepsis Markers  Recent Labs Lab 11/24/16 1809  LATICACIDVEN 12.94*    ABG  Recent Labs Lab 11/24/16 2247 11/25/16 1028  PHART 7.375 7.424  PCO2ART 32.4 38.0  PO2ART 55.0* 105    Liver Enzymes  Recent Labs Lab 11/24/16 1758 11/26/16 0453  AST 32 20  ALT 22 15*  ALKPHOS 69 45  BILITOT 0.6 0.3  ALBUMIN 3.1* 1.8*    Cardiac Enzymes No results for input(s): TROPONINI, PROBNP in the last 168 hours.  Glucose  Recent Labs Lab 11/27/16 0106 11/27/16 0124 11/27/16 0246 11/27/16 0354 11/27/16 0612 11/27/16 0801  GLUCAP 60* 95 71 125* 222* 303*    Imaging Mr Brain Wo Contrast  Result Date: 11/27/2016 CLINICAL DATA:  Unresponsive, hyperglycemia and seizures. History of hypertension, diabetes and end-stage renal disease on dialysis. EXAM: MRI HEAD WITHOUT CONTRAST TECHNIQUE: Multiplanar, multiecho pulse sequences of the brain and surrounding structures were obtained without intravenous contrast. COMPARISON:  CT HEAD November 24, 2016 FINDINGS: BRAIN: No reduced diffusion to suggest acute ischemia. No susceptibility artifact to suggest hemorrhage. The ventricles and sulci are normal for patient's age. No suspicious parenchymal signal, masses or mass effect. No abnormal extra-axial fluid collections. No extra-axial masses though, contrast enhanced sequences would be more sensitive. Symmetric normal size, morphology and signal of the hippocampi. VASCULAR: Normal major intracranial vascular flow  voids present at skull base. SKULL AND UPPER CERVICAL SPINE: No abnormal sellar expansion. No suspicious calvarial bone marrow signal. Craniocervical junction maintained. Moderate LEFT temporomandibular osteoarthrosis. Small RIGHT temporomandibular joint effusion. SINUSES/ORBITS: Trace paranasal sinus mucosal thickening. RIGHT concha bullosa. Small mastoid effusions. Prominent superior ophthalmic veins compatible with ventilation. The included ocular globes and orbital contents are non-suspicious. OTHER: LEFT suboccipital small scalp lipoma. Life-support lines in place. IMPRESSION: Negative MRI head for age. Electronically Signed   By: Elon Alas M.D.   On: 11/27/2016 01:19   STUDIES:  MRI brain 12/31>>>neg   CULTURES: none  ANTIBIOTICS: none  SIGNIFICANT EVENTS: 12/29 admitted. High dose versed gtt for status 12/31 versed gtt off. Waking up. Agitated so precedex started for weaning   LINES/TUBES: PIV ETT 12/29 >>1/1  DISCUSSION: [name] is a 61M with PMH significant for IDDM, ESRD on PD, HTN, who presented with concern for acute stroke and had witnessed seizures in the ED. CT and CTA of the head were negative for signs of stroke. The thought is that profound hyperglycemia vs hypertensive encephalopathy precipitated his seizure. He was also markedly hypertensive - it is unclear what the underlying etiology for his acute hypertensive crisis and hyperglycemia is -MRI was negative. Sedation off. Passed SBT and Extubated this am.  Somnolent ? D/t residual metabolic derangements vs Keppra.   For today: NPO Bedside swallow when MS allows Cont ssi, will need to be re-assessed as we add back diet.  Keppra (decreasing dose) May need f/u EEG if MS not improved as day goes on.   ASSESSMENT / PLAN: NEUROLOGIC A:   Seizure, likely 2/2 hyperglycemia Residual metabolic encephalopathy.  >EEG 12/29 negative for seizure activity  >MRI was negative  P:   Stop sedating meds PT/OT  consult OOB later today  Bedside swallow when MS allows.  Cont Keppra per Neuro  PULMONARY A: Need for intubation 2/2 mental status Bibasilar atx and hypoxia prior to intubation  P:   Wean FIO2 Mobilize Aspiration precautions  CARDIOVASCULAR A:  Hypertension - improved P:  Resume home BP meds as able (no change for today)   RENAL A:   ESRD on peritoneal dialysis - no acute need for dialysis Elevated lactate (likely 2/2 seizure) P:   Cont PD per nephro  Monitor lytes  GASTROINTESTINAL A:   Aspiration risk  P:   BS swallow eval   HEMATOLOGIC A:   No acute issues P:  Monitor  INFECTIOUS A:   No overt signs or symptoms of infection P:   F/u pending culture data  ENDOCRINE A:   IDDM w/ marked hyperglycemia P:   ssi  Will need special attention to glycemic control     FAMILY  - Updates: brother-in-law and son at bedside. Son is next of kin, but patient lives with sister, who is very involved in his care. Consensus is that patient would  wish to be FULL CODE.   - Inter-disciplinary family meet or Palliative Care meeting due by:  day 7  My critical care time 30 minutes  Erick Colace ACNP-BC [place] Pager # [phone] OR # [phone] if no answer 11/27/2016, 9:01 AM  Attending Note:  I have examined patient, reviewed labs, studies and notes. I have discussed the case with Jerrye Bushy, and I agree with the data and plans as amended above. 61 yo man, hx DM, CKD on PD, HTN, suffered an acute CVA with seizures. Required intubation and deep sedation to break seizures. On eval today he was more awake, tolerating PSV.  He is being empirically treated for possible infxn as a precipitant of seizures. Also correcting glucose and BP. We will extubate today, check swallowing eval, push pulm hygiene, PT/OT,  Independent critical care time is 35 minutes.   Baltazar Apo, MD, PhD 11/27/2016, 10:26 PM [facility] [phone] or if  no answer [phone]

## 2016-11-27 NOTE — Progress Notes (Signed)
Patient back to 3M04  from transport to MRI without incidence.

## 2016-11-27 NOTE — Progress Notes (Signed)
Subjective: Extubated this am.   Exam: Vitals:   11/27/16 0600 11/27/16 0630  BP: 109/67 111/71  Pulse: 75 75  Resp: 15 15  Temp:     Gen: In bed, NAD Resp: non-labored breathing, no acute distress Abd: soft, nt  Neuro: MS: obtunded, but does follw commands to squeeze fingers bilaterally JY:NWGNF, too letargic to get to fixate/track Motor: follows commands in bilateral upper extremities.  Sensory:withdraws to noxious stimuli x 4  Pertinent Labs:   Impression: 61 yo M with new onset seizure activity in the setting of BG of 700. This can cause focal deficits as well, and could be responsible for his RUE weakness given the improvement seen. I would favor continued anti-epileptic therapy for now, but will not likely need it long term. It may be contributing to sedation and will reduce this to 500mg  daily  Recommendations: 1) decerase keppra to 500mg  daily.  2) will follow.   [name], MD [facility] [phone]  If 7pm- 7am, please page neurology on call as listed in [place].

## 2016-11-27 NOTE — Progress Notes (Signed)
Inpatient Diabetes Program Recommendations  AACE/ADA: New Consensus Statement on Inpatient Glycemic Control (2015)  Target Ranges:  Prepandial:   less than 140 mg/dL      Peak postprandial:   less than 180 mg/dL (1-2 hours)      Critically ill patients:  140 - 180 mg/dL   Results for JOWELL, BOSSI (MRN [id number]) as of 11/27/2016 12:58  Ref. Range 11/27/2016 06:12 11/27/2016 08:01 11/27/2016 11:40  Glucose-Capillary Latest Ref Range: 65 - 99 mg/dL 222 (H) 303 (H) 362 (H)   Review of Glycemic Control  Diabetes history: DM 2 Outpatient Diabetes medications: NPH 4-19 units BID, Novolin R 2-6 units BID with meals Current orders for Inpatient glycemic control: Novolog Moderate Q4hours, Novolog 1 units Q4hours  Inpatient Diabetes Program Recommendations:   Glucose in 300's. Currently extubated NPO waiting for swallow function assessment. Patient takes NPH basal insulin at home. Please consider starting Levemir 8 units BID while inpatient.  Thanks,  Tama Headings RN, MSN, [facility] Inpatient Diabetes Coordinator Team Pager [phone] (8a-5p)

## 2016-11-27 NOTE — Progress Notes (Signed)
[place] Progress Note Patient Name: Jared [name] DOB: 27-Sep-1956 MRN: [id number]   Date of Service  11/27/2016  HPI/Events of Note  RN calls regarding patient's sugar. Patient's sugar has been up and down depending on whether he is of or on the insulin drip.   On insulin drip now, his sugars been running low the last 2 times.   eICU Interventions  Will hold off on insulin drip. Place him back on insulin sliding scale as he was on yesterday. Observe sugar trend while on sliding scale insulin.      Intervention Category Intermediate Interventions: Other:  [place] 11/27/2016, 3:00 AM

## 2016-11-27 NOTE — Progress Notes (Signed)
[facility] Progress Note   Subjective:   Not responding to verbal.  Slightly agitated, mittens in place.  Will open eyes and follow some commands.  Smiles at me.  Was extubated this AM, has been getting intermittent sedation prior to extubation.  Significant other and son at bedside.  I saw pt around 5 PM and he showed no sign of recognizing me (I have been his nephrologist since he moved here a couple of years ago)  Objective Vitals:   11/27/16 1000 11/27/16 1016 11/27/16 1200 11/27/16 1400  BP:  (!) 150/78    Pulse:      Resp:    (!) 28  Temp:   98.6 F (37 C)   TempSrc:   Oral   SpO2:      Weight: 83.9 kg (184 lb 15.5 oz)     Height:       Physical Exam General: WN,WD NAD Heart: S1,S2, RRR ? 1/6 systolic M 2nd ICS RSB. Tachy-HR 110s.  Lungs: BBS CTAB. No stridor post extubation. No WOB.  Abdomen: active BS, non-tender Extremities: Trace LE Edema pretib/ankles.  Dialysis Access: LUA AVF + bruit PD cath RUQ Drsg CDI.    Additional Objective Labs: Basic Metabolic Panel:  Recent Labs Lab 11/25/16 0732  11/25/16 1649 11/26/16 0453 11/26/16 1643 11/27/16 0822  NA 138  --   --  135  --  136  K 3.4*  --   --  5.0  --  5.2*  CL 101  --   --  98*  --  99*  CO2 24  --   --  26  --  26  GLUCOSE 150*  --   --  313*  --  299*  BUN 63*  --   --  80*  --  69*  CREATININE 11.30*  --   --  12.08*  --  10.72*  CALCIUM 8.5*  --   --  8.1*  --  7.9*  PHOS 3.5  < > 6.6* 7.6* 7.9*  --   < > = values in this interval not displayed. Liver Function Tests:  Recent Labs Lab 11/24/16 1758 11/26/16 0453 11/27/16 0822  AST 32 20 15  ALT 22 15* 13*  ALKPHOS 69 45 50  BILITOT 0.6 0.3 0.5  PROT 7.5 5.3* 5.4*  ALBUMIN 3.1* 1.8* 1.7*     Recent Labs Lab 11/24/16 1758  11/24/16 2236 11/25/16 0732 11/26/16 0453 11/27/16 0822  WBC 6.7  --  6.1 8.8 9.5 8.6  NEUTROABS 3.3  --   --   --   --   --   HGB 13.6  < > 12.7* 11.5* 11.2* 10.8*  HCT 43.0  < > 39.2  36.4* 34.2* 32.6*  MCV 86.9  --  82.9 84.5 82.4 81.1  PLT 245  --  248 188 151 173  < > = values in this interval not displayed. Blood Culture    Component Value Date/Time   SDES BLOOD RIGHT HAND 11/24/2016 2236   SPECREQUEST BOTTLES DRAWN AEROBIC AND ANAEROBIC 5ML 11/24/2016 2236   CULT NO GROWTH 2 DAYS 11/24/2016 2236   REPTSTATUS PENDING 11/24/2016 2236     Recent Labs Lab 11/27/16 0246 11/27/16 0354 11/27/16 0612 11/27/16 0801 11/27/16 1140  GLUCAP 71 125* 222* 303* 362*   Studies/Results: Mr Brain Wo Contrast  Result Date: 11/27/2016 CLINICAL DATA:  Unresponsive, hyperglycemia and seizures. History of hypertension, diabetes and end-stage renal disease on dialysis. EXAM: MRI HEAD WITHOUT CONTRAST  TECHNIQUE: Multiplanar, multiecho pulse sequences of the brain and surrounding structures were obtained without intravenous contrast. COMPARISON:  CT HEAD November 24, 2016 FINDINGS: BRAIN: No reduced diffusion to suggest acute ischemia. No susceptibility artifact to suggest hemorrhage. The ventricles and sulci are normal for patient's age. No suspicious parenchymal signal, masses or mass effect. No abnormal extra-axial fluid collections. No extra-axial masses though, contrast enhanced sequences would be more sensitive. Symmetric normal size, morphology and signal of the hippocampi. VASCULAR: Normal major intracranial vascular flow voids present at skull base. SKULL AND UPPER CERVICAL SPINE: No abnormal sellar expansion. No suspicious calvarial bone marrow signal. Craniocervical junction maintained. Moderate LEFT temporomandibular osteoarthrosis. Small RIGHT temporomandibular joint effusion. SINUSES/ORBITS: Trace paranasal sinus mucosal thickening. RIGHT concha bullosa. Small mastoid effusions. Prominent superior ophthalmic veins compatible with ventilation. The included ocular globes and orbital contents are non-suspicious. OTHER: LEFT suboccipital small scalp lipoma. Life-support lines in  place. IMPRESSION: Negative MRI head for age. Electronically Signed   By: Elon Alas M.D.   On: 11/27/2016 01:19   Dg Chest Port 1 View  Result Date: 11/26/2016 CLINICAL DATA:  Atelectasis. EXAM: PORTABLE CHEST 1 VIEW COMPARISON:  Radiograph November 24, 2016. FINDINGS: Nasogastric and endotracheal tubes are unchanged in position. No pneumothorax is noted. Hypoinflation of the lungs is noted with increased bibasilar opacities most consistent with atelectasis or infiltrates. Bony thorax is unremarkable. IMPRESSION: Stable support apparatus. Hypoinflation of the lungs with increased bibasilar atelectasis or infiltrates. Electronically Signed   By: Marijo Conception, M.D.   On: 11/26/2016 07:22   Medications: . feeding supplement (VITAL AF 1.2 CAL) Stopped (11/27/16 0800)   . dialysis solution 2.5% low-MG/low-CA   Intraperitoneal Q24H  . dialysis solution 4.25% low-MG/low-CA   Intraperitoneal Q24H  . gentamicin cream  1 application Topical Daily  . heparin  5,000 Units Subcutaneous Q8H  . insulin aspart  0-15 Units Subcutaneous Q4H  . insulin aspart  1 Units Subcutaneous Q4H  . levETIRAcetam  1,000 mg Intravenous Q24H  . mouth rinse  15 mL Mouth Rinse BID     Assessment: 1.Seizures new onset - on IV Keppra. No seizures reported. Neurology following.  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 Now moving R. Arm, squeezes hand to commands but following commands sporadically.  3.ESRD on CCPD. K+ 5.2.  Scr 10.7 BUN 69. Last PD Kt/V in ecube 2.49 09/05/16. Scr trends 10-12s. Will call home therapies tomorrow and see if there is more recent PD Kt/V available. No October was the last one - would have been due this month 4.HTN: Hypertensive at present. Getting IV Labetolol.Patient was on Amlodipine 10 mg PO q hs and metoprolol 100 mg PO BID and hydralazine at home. Still NPO-needs swallowing eval.  Resume home meds when possible.  5. Volume: Wt today 83.9. Using 2.5% and 4.5% Dianeal  solution. Continue same orders.  6. Anemia HGB 10.8. No ESA needed yet. Follow HGB.  7. Uncont DM - severe hyperglycemia on admission. Better control now, off insulin drip. Per primary.  8. VDRF-extubated this AM. Slowly waking up from sedation. Now following commands.   Rita H. Brown NP-C 11/27/2016, 3:09 PM  [facility] [phone]  I have seen and examined this patient and agree with plan and assessment in the above note with renal recommendations/intervention highlighted. Very worrisome neurologic picture that by all accounts is improving only very slowly. No stroke on MRI. Neuro has reduced Keppra, don't think will need long term, and wonder if  his markedly elevated BS accounted for this picture.  Did not seem to recognize me at all today (and I have cared for him on both HD and PD since he moved to [facility]).  He will have to be substantially better neurologically if he is ever to resume CCPD. Fortunately has a functional L AVF and can do HD pending neuro recovery. He is nutritionally compromised and with albumin now <2, should probably switch over to HD for the time being anyway.    Coree Riester B,MD 11/27/2016 5:15 PM

## 2016-11-28 DIAGNOSIS — Z992 Dependence on renal dialysis: Secondary | ICD-10-CM

## 2016-11-28 DIAGNOSIS — I10 Essential (primary) hypertension: Secondary | ICD-10-CM

## 2016-11-28 DIAGNOSIS — N186 End stage renal disease: Secondary | ICD-10-CM

## 2016-11-28 LAB — CBC
HCT: 32.4 % — ABNORMAL LOW (ref 39.0–52.0)
HEMOGLOBIN: 10.4 g/dL — AB (ref 13.0–17.0)
MCH: 26.3 pg (ref 26.0–34.0)
MCHC: 32.1 g/dL (ref 30.0–36.0)
MCV: 81.8 fL (ref 78.0–100.0)
Platelets: 197 10*3/uL (ref 150–400)
RBC: 3.96 MIL/uL — AB (ref 4.22–5.81)
RDW: 17 % — ABNORMAL HIGH (ref 11.5–15.5)
WBC: 8 10*3/uL (ref 4.0–10.5)

## 2016-11-28 LAB — GLUCOSE, CAPILLARY
GLUCOSE-CAPILLARY: 174 mg/dL — AB (ref 65–99)
GLUCOSE-CAPILLARY: 179 mg/dL — AB (ref 65–99)
GLUCOSE-CAPILLARY: 295 mg/dL — AB (ref 65–99)
GLUCOSE-CAPILLARY: 345 mg/dL — AB (ref 65–99)
Glucose-Capillary: 349 mg/dL — ABNORMAL HIGH (ref 65–99)

## 2016-11-28 LAB — COMPREHENSIVE METABOLIC PANEL
ALK PHOS: 49 U/L (ref 38–126)
ALT: 13 U/L — AB (ref 17–63)
ANION GAP: 14 (ref 5–15)
AST: 20 U/L (ref 15–41)
Albumin: 1.9 g/dL — ABNORMAL LOW (ref 3.5–5.0)
BILIRUBIN TOTAL: 0.5 mg/dL (ref 0.3–1.2)
BUN: 67 mg/dL — ABNORMAL HIGH (ref 6–20)
CALCIUM: 7.9 mg/dL — AB (ref 8.9–10.3)
CO2: 25 mmol/L (ref 22–32)
CREATININE: 10.46 mg/dL — AB (ref 0.61–1.24)
Chloride: 96 mmol/L — ABNORMAL LOW (ref 101–111)
GFR, EST AFRICAN AMERICAN: 5 mL/min — AB (ref >60)
GFR, EST NON AFRICAN AMERICAN: 5 mL/min — AB (ref >60)
Glucose, Bld: 381 mg/dL — ABNORMAL HIGH (ref 65–99)
Potassium: 4.4 mmol/L (ref 3.5–5.1)
Sodium: 135 mmol/L (ref 135–145)
TOTAL PROTEIN: 5.6 g/dL — AB (ref 6.5–8.1)

## 2016-11-28 LAB — IRON AND TIBC
Iron: 78 ug/dL (ref 45–182)
Saturation Ratios: 48 % — ABNORMAL HIGH (ref 17.9–39.5)
TIBC: 162 ug/dL — AB (ref 250–450)
UIBC: 84 ug/dL

## 2016-11-28 MED ORDER — SEVELAMER CARBONATE 800 MG PO TABS
800.0000 mg | ORAL_TABLET | Freq: Three times a day (TID) | ORAL | Status: DC
  Administered 2016-11-29 – 2016-12-01 (×7): 800 mg via ORAL
  Filled 2016-11-28 (×7): qty 1

## 2016-11-28 MED ORDER — DARBEPOETIN ALFA 60 MCG/0.3ML IJ SOSY
60.0000 ug | PREFILLED_SYRINGE | INTRAMUSCULAR | Status: DC
  Administered 2016-11-28: 60 ug via INTRAVENOUS
  Filled 2016-11-28: qty 0.3

## 2016-11-28 MED ORDER — HYDRALAZINE HCL 50 MG PO TABS
100.0000 mg | ORAL_TABLET | Freq: Three times a day (TID) | ORAL | Status: DC
  Administered 2016-11-28 – 2016-12-01 (×9): 100 mg via ORAL
  Filled 2016-11-28 (×9): qty 2

## 2016-11-28 MED ORDER — INSULIN ASPART 100 UNIT/ML ~~LOC~~ SOLN
0.0000 [IU] | Freq: Three times a day (TID) | SUBCUTANEOUS | Status: DC
  Administered 2016-11-28: 11 [IU] via SUBCUTANEOUS
  Administered 2016-11-29: 7 [IU] via SUBCUTANEOUS
  Administered 2016-11-29: 3 [IU] via SUBCUTANEOUS
  Administered 2016-11-30 (×2): 4 [IU] via SUBCUTANEOUS
  Administered 2016-12-01: 3 [IU] via SUBCUTANEOUS

## 2016-11-28 MED ORDER — AMLODIPINE BESYLATE 10 MG PO TABS
10.0000 mg | ORAL_TABLET | Freq: Every day | ORAL | Status: DC
  Administered 2016-11-28 – 2016-12-01 (×4): 10 mg via ORAL
  Filled 2016-11-28 (×4): qty 1

## 2016-11-28 MED ORDER — INSULIN ASPART 100 UNIT/ML ~~LOC~~ SOLN: 0.0000 [IU] | Freq: Every day | SUBCUTANEOUS | Status: DC

## 2016-11-28 MED ORDER — METOPROLOL TARTRATE 50 MG PO TABS
50.0000 mg | ORAL_TABLET | Freq: Two times a day (BID) | ORAL | Status: DC
  Administered 2016-11-28: 50 mg via ORAL
  Filled 2016-11-28: qty 1

## 2016-11-28 MED ORDER — DARBEPOETIN ALFA 60 MCG/0.3ML IJ SOSY
PREFILLED_SYRINGE | INTRAMUSCULAR | Status: AC
  Filled 2016-11-28: qty 0.3

## 2016-11-28 MED ORDER — CALCITRIOL 0.5 MCG PO CAPS
0.5000 ug | ORAL_CAPSULE | Freq: Every day | ORAL | Status: DC
  Administered 2016-11-28 – 2016-12-01 (×4): 0.5 ug via ORAL
  Filled 2016-11-28 (×5): qty 1

## 2016-11-28 MED ORDER — SODIUM CHLORIDE 0.9 % IV SOLN
500.0000 mg | INTRAVENOUS | Status: DC
  Administered 2016-11-28: 500 mg via INTRAVENOUS
  Filled 2016-11-28: qty 5

## 2016-11-28 MED ORDER — RENA-VITE PO TABS
1.0000 | ORAL_TABLET | Freq: Every day | ORAL | Status: DC
  Administered 2016-11-28 – 2016-11-30 (×3): 1 via ORAL
  Filled 2016-11-28 (×4): qty 1

## 2016-11-28 MED ORDER — NEPRO/CARBSTEADY PO LIQD
237.0000 mL | Freq: Two times a day (BID) | ORAL | Status: DC
  Administered 2016-11-29 – 2016-12-01 (×4): 237 mL via ORAL
  Filled 2016-11-28 (×5): qty 237

## 2016-11-28 MED ORDER — METOPROLOL TARTRATE 100 MG PO TABS
100.0000 mg | ORAL_TABLET | Freq: Two times a day (BID) | ORAL | Status: DC
  Administered 2016-11-28 – 2016-12-01 (×6): 100 mg via ORAL
  Filled 2016-11-28 (×5): qty 2
  Filled 2016-11-28: qty 1

## 2016-11-28 MED ORDER — LABETALOL HCL 5 MG/ML IV SOLN
20.0000 mg | INTRAVENOUS | Status: DC | PRN
  Administered 2016-11-28 – 2016-11-29 (×10): 20 mg via INTRAVENOUS
  Filled 2016-11-28 (×8): qty 4

## 2016-11-28 MED ORDER — INSULIN NPH (HUMAN) (ISOPHANE) 100 UNIT/ML ~~LOC~~ SUSP
10.0000 [IU] | Freq: Two times a day (BID) | SUBCUTANEOUS | Status: DC
  Administered 2016-11-28 – 2016-12-01 (×7): 10 [IU] via SUBCUTANEOUS
  Filled 2016-11-28: qty 10

## 2016-11-28 NOTE — Procedures (Signed)
Having issues with AVF cannulation Pt bent arm, clotted sytem, wasting blood Re-cannulating. If further issues will need to set up a fistulagram. (no probs with the AVF when on hemo a couple years back - AVF hasn't been used since pt started PD)  Jamal Maes, MD [place] Pager 11/28/2016, 3:21 PM

## 2016-11-28 NOTE — Progress Notes (Signed)
Nutrition Follow-up  INTERVENTION:   Nepro Shake po BID, each supplement provides 425 kcal and 19 grams protein  NUTRITION DIAGNOSIS:   Malnutrition related to chronic illness (ESRD on PD) as evidenced by severe depletion of muscle mass, severe depletion of body fat. Ongoing.   GOAL:   Patient will meet greater than or equal to 90% of their needs Progressing.   MONITOR:   PO intake, Supplement acceptance, Diet advancement, I & O's, Labs, Weight trends  ASSESSMENT:    54M with PMH significant for IDDM, ESRD on PD, HTN, who presented with concern for acute stroke and had witnessed seizures in the ED. CT and CTA of the head were negative for signs of stroke. The thought is that profound hyperglycemia vs hypertensive encephalopathy precipitated his seizure.   Per sister pt has had a steady decline in weight but unable to provide specifics. Reports he eats 3 meals per day but very little at meals, no specifics. Pt lives with sister but he cares for himself. Sister is aware that HD center is concerned about his weight and has asked him to drink Nepro and gain weight.  Pt is sleepy and does not answer questions.  Pt discussed during ICU rounds and with RN.   Nutrition-Focused physical exam completed. Findings are severe fat depletion, mild/moderate - severe muscle depletion, and mild edema.   Labs reviewed: PO4 7.9 CBG's: [phone] - not on insulin drip due to lows, transition to home insulin regimen    Diet Order:  Diet full liquid Room service appropriate? Yes; Fluid consistency: Thin; Fluid restriction: 1200 mL Fluid  Skin:  Reviewed, no issues  Last BM:  unknown  Height:   Ht Readings from Last 1 Encounters:  11/28/16 5\' 10"  (1.778 m)    Weight:   Wt Readings from Last 1 Encounters:  11/28/16 175 lb 7.8 oz (79.6 kg)    Ideal Body Weight:  75.45 kg  BMI:  Body mass index is 25.18 kg/m.  Estimated Nutritional Needs:   Kcal:  2200-2400  Protein:  100-125  grams  Fluid:  1.2 L/day  EDUCATION NEEDS:   No education needs identified at this time  [place], [place], [place] Pager [phone] After Hours Pager

## 2016-11-28 NOTE — Progress Notes (Signed)
eLink Physician-Brief Progress Note Patient Name: Jared [name] DOB: May 17, 1956 MRN: [id number]   Date of Service  11/28/2016  HPI/Events of Note  Hypertension - request to renew Labetalol order.   eICU Interventions  Will renew Labetalol order.     Intervention Category Intermediate Interventions: Hypertension - evaluation and management  [name] 11/28/2016, 5:10 AM

## 2016-11-28 NOTE — Progress Notes (Signed)
[name] Progress Note   Subjective:   Out of restraints, opens eyes, follows commands, recognizes me today, no recollection that I was here yesterday RN says tried to take in small amts liquids They will try to get him OOB to chair today BS's erratic Just finished his CCPD  Objective Vitals:   11/28/16 1011 11/28/16 1030 11/28/16 1048 11/28/16 1100  BP: (!) 177/95 (!) 192/103 (!) 183/92 (!) 171/94  Pulse: 99 99 94 93  Resp:  (!) 22 (!) 28 (!) 21  Temp:      TempSrc:      SpO2:  95% 95% 97%  Weight:      Height:       Physical Exam General: WN,WD NAD Edentulous ? R facial droop? Lungs clear A0T6 No S3 1/6 systolic murmur LSB No diastolic murmur or rub Abd soft, PD cath exit site dressing in place No LE edema Dialysis Access: LUA AVF + PD cath RUQ Drsg CDI.    Recent Labs Lab 11/25/16 1649 11/26/16 0453 11/26/16 1643 11/27/16 0822 11/28/16 0413  NA  --  135  --  136 135  K  --  5.0  --  5.2* 4.4  CL  --  98*  --  99* 96*  CO2  --  26  --  26 25  GLUCOSE  --  313*  --  299* 381*  BUN  --  80*  --  69* 67*  CREATININE  --  12.08*  --  10.72* 10.46*  CALCIUM  --  8.1*  --  7.9* 7.9*  PHOS 6.6* 7.6* 7.9*  --   --    Liver Function Tests:  Recent Labs Lab 11/26/16 0453 11/27/16 0822 11/28/16 0413  AST 20 15 20   ALT 15* 13* 13*  ALKPHOS 45 50 49  BILITOT 0.3 0.5 0.5  PROT 5.3* 5.4* 5.6*  ALBUMIN 1.8* 1.7* 1.9*     Recent Labs Lab 11/24/16 1758  11/24/16 2236 11/25/16 0732 11/26/16 0453 11/27/16 0822 11/28/16 0413  WBC 6.7  --  6.1 8.8 9.5 8.6 8.0  NEUTROABS 3.3  --   --   --   --   --   --   HGB 13.6  < > 12.7* 11.5* 11.2* 10.8* 10.4*  HCT 43.0  < > 39.2 36.4* 34.2* 32.6* 32.4*  MCV 86.9  --  82.9 84.5 82.4 81.1 81.8  PLT 245  --  248 188 151 173 197  < > = values in this interval not displayed. Blood Culture    Component Value Date/Time   SDES BLOOD RIGHT HAND 11/24/2016 2236   SPECREQUEST BOTTLES DRAWN AEROBIC AND  ANAEROBIC 5ML 11/24/2016 2236   CULT NO GROWTH 2 DAYS 11/24/2016 2236   REPTSTATUS PENDING 11/24/2016 2236     Recent Labs Lab 11/27/16 1555 11/27/16 2025 11/28/16 0012 11/28/16 0414 11/28/16 0818  GLUCAP 318* 178* 174* 345* 349*   Studies/Results: Mr Brain Wo Contrast  Result Date: 11/27/2016 CLINICAL DATA:  Unresponsive, hyperglycemia and seizures. History of hypertension, diabetes and end-stage renal disease on dialysis. EXAM: MRI HEAD WITHOUT CONTRAST TECHNIQUE: Multiplanar, multiecho pulse sequences of the brain and surrounding structures were obtained without intravenous contrast. COMPARISON:  CT HEAD November 24, 2016 FINDINGS: BRAIN: No reduced diffusion to suggest acute ischemia. No susceptibility artifact to suggest hemorrhage. The ventricles and sulci are normal for patient's age. No suspicious parenchymal signal, masses or mass effect. No abnormal extra-axial fluid collections. No extra-axial masses though, contrast enhanced  sequences would be more sensitive. Symmetric normal size, morphology and signal of the hippocampi. VASCULAR: Normal major intracranial vascular flow voids present at skull base. SKULL AND UPPER CERVICAL SPINE: No abnormal sellar expansion. No suspicious calvarial bone marrow signal. Craniocervical junction maintained. Moderate LEFT temporomandibular osteoarthrosis. Small RIGHT temporomandibular joint effusion. SINUSES/ORBITS: Trace paranasal sinus mucosal thickening. RIGHT concha bullosa. Small mastoid effusions. Prominent superior ophthalmic veins compatible with ventilation. The included ocular globes and orbital contents are non-suspicious. OTHER: LEFT suboccipital small scalp lipoma. Life-support lines in place. IMPRESSION: Negative MRI head for age. Electronically Signed   By: Elon Alas M.D.   On: 11/27/2016 01:19   Medications: . feeding supplement (VITAL AF 1.2 CAL) Stopped (11/27/16 0800)   . amLODipine  10 mg Oral Daily  . calcitRIOL  0.5 mcg  Oral Daily  . dialysis solution 2.5% low-MG/low-CA   Intraperitoneal Q24H  . dialysis solution 4.25% low-MG/low-CA   Intraperitoneal Q24H  . gentamicin cream  1 application Topical Daily  . heparin  5,000 Units Subcutaneous Q8H  . hydrALAZINE  100 mg Oral TID  . insulin aspart  0-20 Units Subcutaneous TID WC  . insulin aspart  0-5 Units Subcutaneous QHS  . insulin NPH Human  10 Units Subcutaneous BID AC & HS  . levETIRAcetam  500 mg Intravenous Q24H  . mouth rinse  15 mL Mouth Rinse BID  . metoprolol tartrate  50 mg Oral BID  . multivitamin  1 tablet Oral QHS  . sevelamer carbonate  800 mg Oral TID WC     Assessment:  1. Seizures new onset - on IV Keppra. Neurology following. Dose reduced to 500/day from 1 gram and not felt will need long term. Working dx for sz + focal defects =>  severe hyperglycemia (per neuro). Slow progress 2. ESRD on CCPD. He will have to be substantially better neurologically in order to resume outpt CCPD. Fortunately has a functional L AVF and can do HD pending neuro recovery. He is nutritionally compromised and with albumin now <2, will plan to switch over to HD for the time being anyway. HD today.   3. HTN: Still hypertensive at present. Back on Amlodipine 10 mg PO q hs/hydralazine 100 TID and metoprolol (will increase to home dose of 100 mg PO BID)   4. Anemia HGB drifting down. Check Fe studies, replete if needed, start Aranesp.  5. Uncont DM - severe hyperglycemia on admission. Better control now, off insulin drip. Erratic BS's.  Plan: Transition to HD for now - TMT  today. Addressing nutrition. Neuro managing sz meds. Increase metoprolol to 100 BID. Iron studies. Start Aranesp. Will need attention to DM management at discharge (he HAS a primary care MD so he has told me, but sees only infrequently if at all...)  Jamal Maes, MD [facility] [phone] Pager 11/28/2016, 11:42 AM

## 2016-11-28 NOTE — Progress Notes (Signed)
Subjective:   Exam: Vitals:   11/28/16 0830 11/28/16 1011  BP: (!) 158/85 (!) 177/95  Pulse: 96 99  Resp: (!) 33   Temp:     Gen: In bed, NAD Resp: non-labored breathing, no acute distress Abd: soft, nt  Neuro: MS: obtunded, but does follw commands to squeeze fingers bilaterally CN: PERRL, counts fingers in all four fields. ? Mild right facial droop.  Motor: follows commands, appears to have very close strength.  Sensory:withdraws to noxious stimuli x 4   Impression: 61 yo M with new onset seizure activity in the setting of BG of 700. This can cause focal deficits as well, and could be responsible for his RUE weakness given the improvement seen. I would favor continued anti-epileptic therapy for now, but will not likely need it long term. Seems to have some delirium and I wonder if his prolonged seizure had some degree of lasting effect. He appears to be making steady progress at this time.   Recommendations: 1) decerase keppra to 500mg  daily, got dose of 1g yesterday, but I have changed this now.  2) will follow.   [name], MD [facility] [phone]  If 7pm- 7am, please page neurology on call as listed in [place].

## 2016-11-28 NOTE — Progress Notes (Signed)
PULMONARY / CRITICAL CARE MEDICINE   Name: [name] MRN: [id number] DOB: [birth year]    ADMISSION DATE:  11/24/2016 CONSULTATION DATE:    REFERRING MD:  EDP  CHIEF COMPLAINT:  Altered mental status, seizure, hyperglycemia  SUBJECTIVE:  extubated  VITAL SIGNS: BP (!) 158/85   Pulse 96   Temp 99.5 F (37.5 C) (Axillary)   Resp (!) 33   Ht 5\' 10"  (1.778 m)   Wt 175 lb 7.8 oz (79.6 kg)   SpO2 98%   BMI 25.18 kg/m   HEMODYNAMICS:    VENTILATOR SETTINGS:    INTAKE / OUTPUT: I/O last 3 completed shifts: In: 1262.8 [I.V.:197.8; NG/GT:845; IV Piggyback:220] Out: 43 [Urine:780]  PHYSICAL EXAMINATION:  General Well nourished, well developed, no apparent distress, now more awake and interactive   HEENT No gross abnormalities.  Pulmonary Clear to auscultation bilaterally with no wheezes, rales or ronchi. Cough strong    Cardiovascular Normal rate, regular rhythm. S1, s2. No m/r/g. Distal pulses palpable.   Abdomen Still Soft, non-tender, distended, positive bowel sounds, no palpable organomegaly or masses. Normoresonant to percussion. PD cathter insertion site clean and dry, no exudate or erythema  Musculoskeletal Grossly normal  Lymphatics No cervical, supraclavicular or axillary adenopathy.   Neurologic Awake, moves all ext. Oriented X 1. Speech clear  Skin/Integuement No rash, no cyanosis, no clubbing.      LABS:  BMET  Recent Labs Lab 11/26/16 0453 11/27/16 0822 11/28/16 0413  NA 135 136 135  K 5.0 5.2* 4.4  CL 98* 99* 96*  CO2 26 26 25   BUN 80* 69* 67*  CREATININE 12.08* 10.72* 10.46*  GLUCOSE 313* 299* 381*    Electrolytes  Recent Labs Lab 11/25/16 1649 11/26/16 0453 11/26/16 1643 11/27/16 0822 11/28/16 0413  CALCIUM  --  8.1*  --  7.9* 7.9*  MG 1.8 2.1 2.0  --   --   PHOS 6.6* 7.6* 7.9*  --   --     CBC  Recent Labs Lab 11/26/16 0453 11/27/16 0822 11/28/16 0413  WBC 9.5 8.6 8.0  HGB 11.2* 10.8* 10.4*  HCT 34.2* 32.6* 32.4*   PLT 151 173 197    Coag's  Recent Labs Lab 11/24/16 1758  APTT 32  INR 1.17    Sepsis Markers  Recent Labs Lab 11/24/16 1809  LATICACIDVEN 12.94*    ABG  Recent Labs Lab 11/24/16 2247 11/25/16 1028  PHART 7.375 7.424  PCO2ART 32.4 38.0  PO2ART 55.0* 105    Liver Enzymes  Recent Labs Lab 11/26/16 0453 11/27/16 0822 11/28/16 0413  AST 20 15 20   ALT 15* 13* 13*  ALKPHOS 45 50 49  BILITOT 0.3 0.5 0.5  ALBUMIN 1.8* 1.7* 1.9*    Cardiac Enzymes No results for input(s): TROPONINI, PROBNP in the last 168 hours.  Glucose  Recent Labs Lab 11/27/16 1140 11/27/16 1555 11/27/16 2025 11/28/16 0012 11/28/16 0414 11/28/16 0818  GLUCAP 362* 318* 178* 174* 345* 349*    Imaging No results found. STUDIES:  MRI brain 12/31>>>neg   CULTURES: none  ANTIBIOTICS: none  SIGNIFICANT EVENTS: 12/29 admitted. High dose versed gtt for status 12/31 versed gtt off. Waking up. Agitated so precedex started for weaning   LINES/TUBES: PIV ETT 12/29 >>1/1  DISCUSSION: [name] is a 63M with PMH significant for IDDM, ESRD on PD, HTN, who presented with concern for acute stroke and had witnessed seizures in the ED. CT and CTA of the head were negative for signs of stroke. The thought  is that profound hyperglycemia vs hypertensive encephalopathy precipitated his seizure. He was also markedly hypertensive - it is unclear what the underlying etiology for his acute hypertensive crisis and hyperglycemia is -MRI was negative. Sedation off. Passed SBT and Extubated 1/1.  No longer somnolent but still a little confused. Speech is clear. Issue over night has primarily been HTN and glycemic control  For today: Adv diet as tolerated  Added NPH and increases SSI Keppra (decreasing dose) Probably SDU later today depending on how his am goes  ASSESSMENT / PLAN: NEUROLOGIC A:   Seizure, likely 2/2 hyperglycemia Residual metabolic encephalopathy.  >EEG 12/29 negative for  seizure activity  >MRI was negative  >MS improved significantly. Still a little confused  P:   J[name]/OT consult OOB later today  Cont Keppra per Neuro  PULMONARY A: Need for intubation 2/2 mental status Bibasilar atx and hypoxia prior to intubation  -extubated 1/1. MS improved.  P:   Wean FIO2 Mobilize Aspiration precautions  CARDIOVASCULAR A:  Hypertension - improved P:  Resume home antihypertensives   RENAL A:   ESRD on peritoneal dialysis - no acute need for dialysis Elevated lactate (likely 2/2 seizure) P:   Cont PD per nephro  Monitor lytes  GASTROINTESTINAL A:   Aspiration risk -->improved MS now Constipation  P:   Adv diet  May need LOC in am   HEMATOLOGIC A:   No acute issues P:  Monitor  INFECTIOUS A:   No overt signs or symptoms of infection P:   F/u pending culture data  ENDOCRINE A:   IDDM w/ marked hyperglycemia-->having some difficulty w/ glycemic control  P:   ssi  Adding back NPH (from home rx) Will need special attention to glycemic control     FAMILY  - Updates: brother-in-law and son at bedside. Son is next of kin, but [name] lives with sister, who is very involved in his care. Consensus is that Jared [name] would wish to be FULL CODE.   - Inter-disciplinary family meet or Palliative Care meeting due by:  day 7  My critical care time 32 minutes  Erick Colace ACNP-BC [facility] Pager # [phone] OR # [phone] if no answer 11/28/2016, 9:17 AM   ATTENDING NOTE / ATTESTATION NOTE :   I have discussed the case with the resident/APP  Marni Griffon NP  I agree with the resident/APP's  history, physical examination, assessment, and plans.    I have edited the above note and modified it according to our agreed history, physical examination, assessment and plan.    Briefly, Jared [name] admitted with seizures and CVA.  Intubated for airway protection, extubated on 1/1.  HTNsive.  Jared [name] with ESRD, chronically on PD.  Tolerating extubation.  Still encephalopathic this am but more awake.  With BP issues.   J[facility] [name] seen, examine. BP 245 systolic.  Arousable. Follows commands.  (-) NVD.  Good ae. CTA. Good s1/s2. (-) s3/m/r/g. (+) BS, soft, NT. Gr 1 edema.   Labs reviewed.  Assessment/Plan: 1. S/P Respiratory failure 2/2 unable to protect airway 2/2 seizures. Extubated on 1/1 and protecting airway.   2. Szes, likely related to elevated CBGs.  Sze free. Will observe.   3. ESRD.  Jared [name] on PD.  Renal service planning to do HD. Appreciate renal recommendations.   4. HTN. Home meds retstarted.  Jared [name] to have HD today.   Potential transfer to SDU later today if beds are needed.  Still on PCCM service for now 2/2 several issues.  Family :Family updated at length today.      Monica Becton, MD 11/28/2016, 2:02 PM [facility] Pager [phone] After 3 pm or if no answer, call [phone]

## 2016-11-29 DIAGNOSIS — G9340 Encephalopathy, unspecified: Secondary | ICD-10-CM

## 2016-11-29 DIAGNOSIS — R569 Unspecified convulsions: Secondary | ICD-10-CM

## 2016-11-29 DIAGNOSIS — R5381 Other malaise: Secondary | ICD-10-CM

## 2016-11-29 LAB — GLUCOSE, CAPILLARY
GLUCOSE-CAPILLARY: 206 mg/dL — AB (ref 65–99)
GLUCOSE-CAPILLARY: 102 mg/dL — AB (ref 65–99)
GLUCOSE-CAPILLARY: 155 mg/dL — AB (ref 65–99)
Glucose-Capillary: 164 mg/dL — ABNORMAL HIGH (ref 65–99)
Glucose-Capillary: 144 mg/dL — ABNORMAL HIGH (ref 65–99)

## 2016-11-29 LAB — HEPATITIS B SURFACE ANTIGEN: Hepatitis B Surface Ag: NEGATIVE

## 2016-11-29 LAB — HEPATITIS B SURFACE ANTIBODY, QUANTITATIVE: Hepatitis B-Post: 14.3 m[IU]/mL (ref >9.9)

## 2016-11-29 LAB — COMPREHENSIVE METABOLIC PANEL
ALT: 15 U/L — AB (ref 17–63)
ANION GAP: 10 (ref 5–15)
AST: 22 U/L (ref 15–41)
Albumin: 2 g/dL — ABNORMAL LOW (ref 3.5–5.0)
Alkaline Phosphatase: 52 U/L (ref 38–126)
BUN: 34 mg/dL — ABNORMAL HIGH (ref 6–20)
CHLORIDE: 100 mmol/L — AB (ref 101–111)
CO2: 27 mmol/L (ref 22–32)
CREATININE: 6.57 mg/dL — AB (ref 0.61–1.24)
Calcium: 8.1 mg/dL — ABNORMAL LOW (ref 8.9–10.3)
GFR calc Af Amer: 9 mL/min — ABNORMAL LOW (ref >60)
GFR calc non Af Amer: 8 mL/min — ABNORMAL LOW (ref >60)
Glucose, Bld: 173 mg/dL — ABNORMAL HIGH (ref 65–99)
POTASSIUM: 4.4 mmol/L (ref 3.5–5.1)
SODIUM: 137 mmol/L (ref 135–145)
Total Bilirubin: 0.3 mg/dL (ref 0.3–1.2)
Total Protein: 5.9 g/dL — ABNORMAL LOW (ref 6.5–8.1)

## 2016-11-29 LAB — HEPATITIS B CORE ANTIBODY, TOTAL: HEP B C TOTAL AB: NEGATIVE

## 2016-11-29 MED ORDER — SODIUM CHLORIDE 0.9 % IV SOLN
500.0000 mg | INTRAVENOUS | Status: AC
  Administered 2016-11-29 – 2016-11-30 (×2): 500 mg via INTRAVENOUS
  Filled 2016-11-29 (×3): qty 5

## 2016-11-29 MED ORDER — CALCIUM CARBONATE ANTACID 500 MG PO CHEW
400.0000 mg | CHEWABLE_TABLET | Freq: Three times a day (TID) | ORAL | Status: DC
  Administered 2016-11-29 – 2016-12-01 (×5): 400 mg via ORAL
  Filled 2016-11-29 (×5): qty 2

## 2016-11-29 MED ORDER — LOSARTAN POTASSIUM 50 MG PO TABS
50.0000 mg | ORAL_TABLET | Freq: Two times a day (BID) | ORAL | Status: DC
  Administered 2016-11-29 – 2016-12-01 (×4): 50 mg via ORAL
  Filled 2016-11-29 (×4): qty 1

## 2016-11-29 NOTE — Consult Note (Signed)
Physical Medicine and Rehabilitation Consult   Reason for Consult: New onset seizures/weakness Referring Physician: Dr.Angelo A de Dios   HPI: Jared [name] is a 61 y.o. 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 He was intubated for airway protection and was loaded with keppra.  CTA head/neck negative for large vessel occlusion. MRI brain negative for acute changes. Seizures felt to be due to hyperglycemia and likely contributing to RUE weakness. Dr. Leonel Ramsay recommends continuing [place]. Dose decreased due to sedation and delirium resolving. BP control improving with addition of Losartan and off insulin drip. Therapy evaluations done revealing generalized weakness with delayed processing and muffled speech. CIR recommended for follow up therapy.     Review of Systems  HENT: Negative for hearing loss.   Respiratory: Negative for cough and shortness of breath.   Cardiovascular: Negative for chest pain and palpitations.  Gastrointestinal: Negative for abdominal pain, heartburn and nausea.  Musculoskeletal: Negative for joint pain and myalgias.  Skin: Negative for rash.  Neurological: Positive for weakness. Negative for dizziness and sensory change.      Past Medical History:  Diagnosis Date  . Anemia   . Chronic kidney disease    on dialysis M-W-F  . Diabetes mellitus without complication ([place])    onset as adult  . Hypertension   . Thyroid disease    hyperparathyroidism    Past Surgical History:  Procedure Laterality Date  . AV FISTULA PLACEMENT Left 06/04/2014   Procedure: ARTERIOVENOUS (AV) FISTULA CREATION-  BRACHIOCEPHALIC WITH LIGATION OF COMPETEING BRANCH;  Surgeon: Mal Misty, MD;  Location: [facility];  Service: Vascular;  Laterality: Left;  . INSERTION OF DIALYSIS CATHETER  04/2014    Family History  Problem Relation Age of Onset  .  Cancer - Lung Mother   . Cancer - Other Father     Social History:  Retired. Lives with sister--retired and in good health. Works part time for [employer]. He reports that he quit smoking about 32 years ago. He has never used smokeless tobacco. He reports that he drinks beer occasionally.  He reports that he does not use drugs.    Allergies  Allergen Reactions  . Penicillins Other (See Comments)    Has patient had a PCN reaction causing immediate rash, facial/tongue/throat swelling, SOB or lightheadedness with hypotension: Unk Has patient had a PCN reaction causing severe rash involving mucus membranes or skin necrosis: Unk Has patient had a PCN reaction that required hospitalization: Unk Has patient had a PCN reaction occurring within the last 10 years: Unk If all of the above answers are "NO", then may proceed with Cephalosporin use.     Medications Prior to Admission  Medication Sig Dispense Refill  . amLODipine (NORVASC) 10 MG tablet Take 10 mg by mouth daily.    Marland Kitchen b complex-vitamin c-folic acid (NEPHRO-VITE) 0.8 MG TABS tablet Take 1 tablet by mouth at bedtime.    . calcitRIOL (ROCALTROL) 0.5 MCG capsule Take 0.5 mcg by mouth daily.    . cinacalcet (SENSIPAR) 30 MG tablet Take 30 mg by mouth daily.    . hydrALAZINE (APRESOLINE) 100 MG tablet Take 100 mg by mouth 3 (three) times daily.    . Insulin NPH Human, Isophane, (NOVOLIN N [place]) Inject 4-19 Units into the skin 2 (two) times daily.     . insulin regular (NOVOLIN R,HUMULIN  R) 100 units/mL injection Inject 2-6 Units into the skin 2 (two) times daily before a meal.     . metoprolol (LOPRESSOR) 100 MG tablet Take 100 mg by mouth 2 (two) times daily.     Marland Kitchen oxyCODONE (ROXICODONE) 5 MG immediate release tablet Take 1 tablet (5 mg total) by mouth every 6 (six) hours as needed for severe pain. 10 tablet 0  . sevelamer carbonate (RENVELA) 800 MG tablet Take 800 mg by mouth 3 (three) times daily with meals.       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  Functional Status:  Mobility: Bed Mobility Overal bed mobility: Needs Assistance Bed Mobility: Supine to Sit Supine to sit: Mod assist, HOB elevated General bed mobility comments: Assist to initiate hips and to elevate trunk to get to EOB.  Transfers Overall transfer level: Needs assistance Equipment used: 2 person hand held assist Transfers: Sit to/from Stand, Stand Pivot Transfers Sit to Stand: Mod assist, +2 physical assistance Stand pivot transfers: Max assist General transfer comment: Assist of 2 to power to standing with cues for hip/knee extension and upright. Able to take a few steps to get to chair with Max A for balance, weigh shifting. pt with bil partial knee buckling and sitting prematurely being lowered into chair.      ADL: ADL Overall ADL's : Needs assistance/impaired Grooming: Moderate assistance, Sitting Grooming Details (indicate cue type and reason): attempting to wipe with L UE but reports R handed. pt wiping briefly and terminating task.  Upper Body Bathing: Maximal assistance Lower Body Bathing: Total assistance Upper Body Dressing : Maximal assistance Lower Body Dressing: Total assistance Lower Body Dressing Details (indicate cue type and reason): don socks General ADL Comments: Pt requries incr time and effort to  transfer to chair with bil LE blocked initially. pt iwth one buckle during transfer  Cognition: Cognition Overall Cognitive Status: Impaired/Different from baseline Orientation Level: Oriented to person, Oriented to place, Oriented to time, Disoriented to situation Cognition Arousal/Alertness: Lethargic Behavior During Therapy: Flat affect Overall Cognitive Status: Impaired/Different from baseline Area of Impairment: Problem solving, Attention Current Attention Level: Sustained Problem Solving: Slow processing, Decreased initiation, Requires verbal cues, Requires tactile cues General Comments: A&Ox4; difficult to understand at times due to muffled speech. Difficult to assess due to: Level of arousal   Blood pressure (!) 155/86, pulse 92, temperature 98.7 F (37.1 C), temperature source Oral, resp. rate (!) 33, height 5\' 10"  (1.778 m), weight 78.6 kg (173 lb 4.5 oz), SpO2 91 %. Physical Exam  Nursing note reviewed. Constitutional: He appears well-developed and well-nourished.  Flat affect. Very distracted and slow to initiate.  HENT:  Head: Normocephalic.  White coating on tongue  Eyes: EOM are normal. Pupils are equal, round, and reactive to light.  Neck: Normal range of motion. Neck supple.  Cardiovascular: Regular rhythm.   Respiratory: Effort normal and breath sounds normal. No stridor. No respiratory distress.  GI: Soft. Bowel sounds are normal. He exhibits no distension. There is no tenderness.  Musculoskeletal: He exhibits no edema  or tenderness.  Neurological: He is alert.  Delayed processing and slow to initiate. Oriented to self and place. Situation "stroke" . Needed cues to recall DOB. Moves all 4's. UE grossly 3+/5 prox to 4/5 distal. LE: 3/5 HF 3+/5 KE and 4-/5 ADF/PF. Sensation grossly intact. DTR's 1+  Skin: Skin is warm and dry.  Psychiatric: His affect is blunt. He is slowed. Cognition and memory are impaired. He is inattentive.    Results for orders placed or  performed during the hospital encounter of 11/24/16 (from the past 24 hour(s))  Hepatitis B surface antigen     Status: None   Collection Time: 11/28/16  4:36 PM  Result Value Ref Range   Hepatitis B Surface Ag Negative Negative  Hepatitis B surface antibody     Status: None   Collection Time: 11/28/16  4:38 PM  Result Value Ref Range   Hepatitis B-Post 14.3 Immunity>9.9 mIU/mL  Hepatitis B core antibody, total     Status: None   Collection Time: 11/28/16  4:38 PM  Result Value Ref Range   Hep B Core Total Ab Negative Negative  Glucose, capillary     Status: Abnormal   Collection Time: 11/28/16  8:39 PM  Result Value Ref Range   Glucose-Capillary 179 (H) 65 - 99 mg/dL  Comprehensive metabolic panel     Status: Abnormal   Collection Time: 11/29/16  4:49 AM  Result Value Ref Range   Sodium 137 135 - 145 mmol/L   Potassium 4.4 3.5 - 5.1 mmol/L   Chloride 100 (L) 101 - 111 mmol/L   CO2 27 22 - 32 mmol/L   Glucose, Bld 173 (H) 65 - 99 mg/dL   BUN 34 (H) 6 - 20 mg/dL   Creatinine, Ser 6.57 (H) 0.61 - 1.24 mg/dL   Calcium 8.1 (L) 8.9 - 10.3 mg/dL   Total Protein 5.9 (L) 6.5 - 8.1 g/dL   Albumin 2.0 (L) 3.5 - 5.0 g/dL   AST 22 15 - 41 U/L   ALT 15 (L) 17 - 63 U/L   Alkaline Phosphatase 52 38 - 126 U/L   Total Bilirubin 0.3 0.3 - 1.2 mg/dL   GFR calc non Af Amer 8 (L) >60 mL/min   GFR calc Af Amer 9 (L) >60 mL/min   Anion gap 10 5 - 15  Glucose, capillary     Status: Abnormal   Collection Time: 11/29/16  5:51 AM  Result Value Ref Range   Glucose-Capillary 164 (H) 65 - 99 mg/dL  Glucose, capillary     Status: Abnormal   Collection Time: 11/29/16  8:18 AM  Result Value Ref Range   Glucose-Capillary 206 (H) 65 - 99 mg/dL   Comment 1 Notify RN    Comment 2 Document in Chart   Glucose, capillary     Status: Abnormal   Collection Time: 11/29/16 11:38 AM  Result Value Ref Range   Glucose-Capillary 102 (H) 65 - 99 mg/dL   Comment 1 Notify RN    Comment 2 Document in Chart    No  results found.  Assessment/Plan: Diagnosis: debility and encephalopathy after seizure and other medical complications 1. Does the need for close, 24 hr/day medical supervision in concert with the patient's rehab needs make it unreasonable for this patient to be served in a less intensive setting? Yes 2. Co-Morbidities requiring supervision/potential complications: ESRD, htn, DM 3. Due to bladder management, bowel management, safety, skin/wound care, disease management, medication administration, pain management and patient education, does  the patient require 24 hr/day rehab nursing? Yes 4. Does the patient require coordinated care of a physician, rehab nurse, PT (1-2 hrs/day, 5 days/week) and OT (1-2 hrs/day, 5 days/week) to address physical and functional deficits in the context of the above medical diagnosis(es)? Yes Addressing deficits in the following areas: balance, endurance, locomotion, strength, transferring, bowel/bladder control, bathing, dressing, feeding, grooming, toileting and psychosocial support 5. Can the patient actively participate in an intensive therapy program of at least 3 hrs of therapy per day at least 5 days per week? Yes 6. The potential for patient to make measurable gains while on inpatient rehab is excellent 7. Anticipated functional outcomes upon discharge from inpatient rehab are modified independent  with PT, modified independent with OT, n/a with SLP. 8. Estimated rehab length of stay to reach the above functional goals is: 7-10 days 9. Does the patient have adequate social supports and living environment to accommodate these discharge functional goals? Yes 10. Anticipated D/C setting: Home 11. Anticipated post D/C treatments: HH therapy and Outpatient therapy 12. Overall Rehab/Functional Prognosis: excellent  RECOMMENDATIONS: This patient's condition is appropriate for continued rehabilitative care in the following setting: CIR Patient has agreed to participate  in recommended program. Yes Note that insurance prior authorization may be required for reimbursement for recommended care.  Comment: Pt was independent and driving prior to admission. Continues to demonstrate ongoing weakness which impacts basic mobility and self-care. Should do well with inpatient rehab and meet modified independent goals. Rehab Admissions Coordinator to follow up.  Thanks,  [name], MD, [place]ilford Pillar, PA-C 11/29/2016

## 2016-11-29 NOTE — Progress Notes (Signed)
PULMONARY / CRITICAL CARE MEDICINE   Name: Jared [name] MRN: [id number] DOB: November 28, 1955    ADMISSION DATE:  11/24/2016 CONSULTATION DATE:    REFERRING MD:  EDP  CHIEF COMPLAINT:  Altered mental status, seizure, hyperglycemia  Brief: 28M with PMH significant for IDDM, HTN, and ESRD on peritoneal dialysis who presented to the ED on 12/29 via EMS as a code stroke. He began seizing on arrival to the ED. He was then intubated. Initial blood glucose was > 600. He was markedly hypertensive with BP 220s/110s. CT and CTA head negative for acute findings. He was reportedly tremulous after CT imaging raising concern for status epilepticus. He was seen by neurology who felt the precipitant for his seizure was hyperglycemia.   SUBJECTIVE:  No events overnight. Patient mental status improved   VITAL SIGNS: BP (!) 144/94   Pulse 95   Temp 98.7 F (37.1 C) (Axillary)   Resp (!) 35   Ht 5\' 10"  (1.778 m)   Wt 78.6 kg (173 lb 4.5 oz)   SpO2 90%   BMI 24.86 kg/m   HEMODYNAMICS:    VENTILATOR SETTINGS:    INTAKE / OUTPUT: I/O last 3 completed shifts: In: 415 [P.O.:200; IV Piggyback:215] Out: 2965 [Urine:965; Other:2000]  PHYSICAL EXAMINATION:  General Adult male, no distress  HEENT No gross abnormalities.  Pulmonary Clear breath sounds  , non-labored   Cardiovascular RRR. S1, s2. No m/r/g.   Abdomen Still Soft, non-tender, distended, active bowel sounds.   Musculoskeletal Grossly normal  Lymphatics No cervical, supraclavicular or axillary adenopathy.   Neurologic Awake, moves all ext. Oriented X 3. Speech clear  Skin/Integuement No rash, no cyanosis, no clubbing.      LABS:  BMET  Recent Labs Lab 11/27/16 0822 11/28/16 0413 11/29/16 0449  NA 136 135 137  K 5.2* 4.4 4.4  CL 99* 96* 100*  CO2 26 25 27   BUN 69* 67* 34*  CREATININE 10.72* 10.46* 6.57*  GLUCOSE 299* 381* 173*    Electrolytes  Recent Labs Lab 11/25/16 1649 11/26/16 0453 11/26/16 1643  11/27/16 0822 11/28/16 0413 11/29/16 0449  CALCIUM  --  8.1*  --  7.9* 7.9* 8.1*  MG 1.8 2.1 2.0  --   --   --   PHOS 6.6* 7.6* 7.9*  --   --   --     CBC  Recent Labs Lab 11/26/16 0453 11/27/16 0822 11/28/16 0413  WBC 9.5 8.6 8.0  HGB 11.2* 10.8* 10.4*  HCT 34.2* 32.6* 32.4*  PLT 151 173 197    Coag's  Recent Labs Lab 11/24/16 1758  APTT 32  INR 1.17    Sepsis Markers  Recent Labs Lab 11/24/16 1809  LATICACIDVEN 12.94*    ABG  Recent Labs Lab 11/24/16 2247 11/25/16 1028  PHART 7.375 7.424  PCO2ART 32.4 38.0  PO2ART 55.0* 105    Liver Enzymes  Recent Labs Lab 11/27/16 0822 11/28/16 0413 11/29/16 0449  AST 15 20 22   ALT 13* 13* 15*  ALKPHOS 50 49 52  BILITOT 0.5 0.5 0.3  ALBUMIN 1.7* 1.9* 2.0*    Cardiac Enzymes No results for input(s): TROPONINI, PROBNP in the last 168 hours.  Glucose  Recent Labs Lab 11/28/16 0414 11/28/16 0818 11/28/16 1223 11/28/16 2039 11/29/16 0551 11/29/16 0818  GLUCAP 345* 349* 295* 179* 164* 206*    Imaging No results found. STUDIES:  MRI brain 12/31>>>neg   CULTURES: none  ANTIBIOTICS: none  SIGNIFICANT EVENTS: 12/29 admitted. High dose versed gtt for status  12/31 versed gtt off. Waking up. Agitated so precedex started for weaning  1/1 Extubated   LINES/TUBES: PIV ETT 12/29 >>1/1  DISCUSSION: Jared [name] is a 34M with PMH significant for IDDM, ESRD on PD, HTN, who presented with concern for acute stroke and had witnessed seizures in the ED. CT and CTA of the head were negative for signs of stroke. The thought is that profound hyperglycemia vs hypertensive encephalopathy precipitated his seizure. He was also markedly hypertensive - it is unclear what the underlying etiology for his acute hypertensive crisis and hyperglycemia is -MRI was negative. Sedation off. Passed SBT and Extubated 1/1. - Transfer to Medsurg vs Stepdown on 1/3  ASSESSMENT / PLAN: NEUROLOGIC A:   Seizure, likely 2/2  hyperglycemia Residual metabolic encephalopathy.  >EEG 12/29 negative for seizure activity  >MRI was negative  P:   PT/OT consult OOB later today  Cont Keppra per Neuro  PULMONARY A: Bibasilar atx and hypoxia prior to intubation  -extubated 1/1. MS improved.  P:   Wean FIO2 Mobilize Aspiration precautions  CARDIOVASCULAR A:  Hypertension - improved P:  Resume home antihypertensives   RENAL A:   ESRD on peritoneal dialysis - Had session on HD on 1/2  Elevated lactate (likely 2/2 seizure) P:          Cont PD per nephro  Monitor lytes  GASTROINTESTINAL A:   Aspiration risk -->improved MS now Constipation  P:   Adv diet as tolerated   HEMATOLOGIC A:   No acute issues P:  Monitor  INFECTIOUS A:   No overt signs or symptoms of infection P:   Trend Fever and WBC curve   ENDOCRINE A:   IDDM w/ marked hyperglycemia-->having some difficulty w/ glycemic control  P:   SSI  Continue NPH (from home rx) Diabetes coordinator following   FAMILY  - Updates: brother-in-law and son at bedside on 1/2. Son is next of kin, but patient lives with sister, who is very involved in his care. Consensus is that patient would wish to be FULL CODE.   - Inter-disciplinary family meet or Palliative Care meeting due by:  day [place], AG-ACNP [facility]  Pgr: [phone]  PCCM Pgr: [phone]   ATTENDING NOTE / ATTESTATION NOTE :   I have discussed the case with the resident/APP Hayden Pedro NP.   I agree with the resident/APP's  history, physical examination, assessment, and plans.    I have edited the above note and modified it according to our agreed history, physical examination, assessment and plan.   Briefly, pt admitted with seizures and CVA.  Intubated for airway protection, extubated on 1/1.  HTNsive.  Pt with ESRD, chronically on PD. Started HD on 1/2 and tolerated well. BP better controlled today.   Pt seen, examine. BP  073 systolic.  Awake, oriented x 3. Follows commands.  (-) NVD.  Good ae. CTA. Good s1/s2. (-) s3/m/r/g. (+) BS, soft, NT. Gr 1 edema.   Labs reviewed.  Assessment/Plan: 1. S/P Respiratory failure 2/2 unable to protect airway 2/2 seizures. Extubated on 1/1 and protecting airway.   2. Szes, likely related to elevated CBGs.  Sze free. Will observe.   3. ESRD.  Pt on PD.  Renal service following pt. HD done on 1/2. Appreciate renal recommendations.   4. HTN. Home meds retstarted on 1/2. BP still elevated but better. Observe for now >> may need to adjust BP meds.   Pt to transfer to med surg  today. TRH will be primary starting 1/4 and PCCM off on 1/4.   Family :Family updated at bedside on 1/3.  No family on 1/4.    Monica Becton, MD 11/29/2016, 12:41 PM [facility] Pager [phone] After 3 pm or if no answer, call [phone]

## 2016-11-29 NOTE — Evaluation (Signed)
Occupational Therapy Evaluation Patient Details Name: Jared [name] MRN: [id number] DOB: [birth year] Today's Date: 11/29/2016    History of Present Illness Patient is a 61 y/o male with hx of ESRD on peritoneal dialysis, CKD, IDDM, HTN presents with AMS and concern for acute stroke with witnessed seizures in the ED. Intubated 12/29-1/1. Glucose >600.  Markedly hypertensive with BP 220s/110s. CT, MRI and CTA head negative.  Question profound hyperglycemia vs hypertensive encephalopathy precipitated his seizure   Clinical Impression   PT admitted with elevated glucose > 600 and seizure with AMS. Pt currently with functional limitiations due to the deficits listed below (see OT problem list). PTA was independent with all adls and iadls per patient.  Pt will benefit from skilled OT to increase their independence and safety with adls and balance to allow discharge CIR. Pt currently with bil LE knee buckle attempting transfer EOB to chair requiring two person (A) .      Follow Up Recommendations  CIR    Equipment Recommendations  Other (comment) (tba)    Recommendations for Other Services Rehab consult     Precautions / Restrictions Precautions Precautions: Fall Precaution Comments: peritoneal access abdomen Restrictions Weight Bearing Restrictions: No      Mobility Bed Mobility Overal bed mobility: Needs Assistance Bed Mobility: Supine to Sit     Supine to sit: Mod assist;HOB elevated     General bed mobility comments: Assist to initiate hips and to elevate trunk to get to EOB.   Transfers Overall transfer level: Needs assistance Equipment used: 2 person hand held assist Transfers: Sit to/from [facility] Sit to Stand: Mod assist;+2 physical assistance Stand pivot transfers: Max assist       General transfer comment: Assist of 2 to power to standing with cues for hip/knee extension and upright. Able to take a few steps to get to chair with Max A for  balance, weigh shifting. pt with bil partial knee buckling and sitting prematurely being lowered into chair.    Balance Overall balance assessment: Needs assistance Sitting-balance support: Feet supported;Single extremity supported Sitting balance-Leahy Scale: Fair     Standing balance support: During functional activity;Bilateral upper extremity supported Standing balance-Leahy Scale: Poor Standing balance comment: reliant on external support for standing balance; cues for hip ext for upright.                            ADL Overall ADL's : Needs assistance/impaired     Grooming: Moderate assistance;Sitting Grooming Details (indicate cue type and reason): attempting to wipe with L UE but reports R handed. pt wiping briefly and terminating task.  Upper Body Bathing: Maximal assistance   Lower Body Bathing: Total assistance   Upper Body Dressing : Maximal assistance   Lower Body Dressing: Total assistance Lower Body Dressing Details (indicate cue type and reason): don socks               General ADL Comments: Pt requries incr time and effort to transfer to chair with bil LE blocked initially. pt iwth one buckle during transfer     Vision Additional Comments: to be further assessed. pt tracking therapist in the room.    Perception     Praxis      Pertinent Vitals/Pain Pain Assessment: No/denies pain     Hand Dominance Right   Extremity/Trunk Assessment Upper Extremity Assessment Upper Extremity Assessment: Overall WFL for tasks assessed   Lower Extremity Assessment Lower Extremity  Assessment: Defer to PT evaluation   Cervical / Trunk Assessment Cervical / Trunk Assessment: Normal   Communication Communication Communication: HOH   Cognition Arousal/Alertness: Lethargic Behavior During Therapy: Flat affect Overall Cognitive Status: Impaired/Different from baseline Area of Impairment: Problem solving;Attention   Current Attention Level:  Sustained         Problem Solving: Slow processing;Decreased initiation;Requires verbal cues;Requires tactile cues General Comments: A&Ox4; difficult to understand at times due to muffled speech.   General Comments       Exercises       Shoulder Instructions      Home Living Family/patient expects to be discharged to:: Private residence Living Arrangements: Other relatives (with sister) Available Help at Discharge: Family;Available PRN/intermittently Type of Home: House Home Access: Stairs to enter [employer] of Steps: flight Entrance Stairs-Rails: Right Home Layout: Two level;Bed/bath upstairs Alternate Level Stairs-Number of Steps: 1 flight Alternate Level Stairs-Rails: Right Bathroom Shower/Tub: Walk-in shower         Home Equipment: None          Prior Functioning/Environment Level of Independence: Independent        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         OT Problem List: Decreased strength;Decreased activity tolerance;Impaired balance (sitting and/or standing);Decreased cognition;Decreased safety awareness;Decreased knowledge of use of DME or AE;Decreased knowledge of precautions;Decreased coordination   OT Treatment/Interventions: Self-care/ADL training;Therapeutic exercise;Neuromuscular education;DME and/or AE instruction;Therapeutic activities;Cognitive remediation/compensation;Patient/family education;Balance training    OT Goals(Current goals can be found in the care plan section) Acute Rehab OT Goals Patient Stated Goal: none stated OT Goal Formulation: With patient Time For Goal Achievement: 12/13/16 Potential to Achieve Goals: Good  OT Frequency: Min 2X/week   Barriers to D/C:            Co-evaluation PT/OT/SLP Co-Evaluation/Treatment: Yes Reason for Co-Treatment: Complexity of the patient's impairments (multi-system involvement);For patient/therapist safety;To address functional/ADL  transfers PT goals addressed during session: Mobility/safety with mobility;Strengthening/ROM;Balance OT goals addressed during session: ADL's and self-care;Strengthening/ROM      End of Session Equipment Utilized During Treatment: Gait belt Nurse Communication: Mobility status;Precautions  Activity Tolerance: Patient limited by fatigue Patient left: in chair;with call bell/phone within reach;with chair alarm set   Time: 0160-1093 OT Time Calculation (min): 18 min Charges:  OT General Charges $OT Visit: 1 Procedure OT Evaluation $OT Eval Moderate Complexity: 1 Procedure G-Codes:    Parke Poisson B 12/16/2016, 11:56 AM    Jeri Modena   OTR/L Pager: [phone] Office: [phone] .

## 2016-11-29 NOTE — Progress Notes (Signed)
Inpatient Rehabilitation  PT and OT have evaluated pt. and have recommended IP Rehab.  Patient was screened by Gerlean Ren for appropriateness for an Inpatient Acute Rehab consult.  At this time, we are recommending Inpatient Rehab consult.  Please order consult if you are agreeable.  [place] Admissions Coordinator Cell [phone] Office [phone]

## 2016-11-29 NOTE — Final Consult Note (Signed)
Final Neurology Sign off Note  Subjective: Wife feels that he is back to baseline.   Exam: Vitals:   11/29/16 0800 11/29/16 0900  BP: (!) 182/82 (!) 144/94  Pulse: 95   Resp: (!) 23 (!) 35  Temp: 98.7 F (37.1 C)    Gen: In bed, NAD Resp: non-labored breathing, no acute distress Abd: soft, nt  Neuro: MS: Much more awake and alert. Interactive and appropriate. Oriented x 3.  CN: PERRL, face symmetric(though does not smile due to lack of dentures)  Motor: follows commands, appears to have very close to full strength.  Sensory: endorses symmetric sensation   Follow up recommendations: Follow as outpatient for consideration of repeat EEG. I have requested this.   Brief summary of case/Impression: 61 yo M with new onset seizure activity in the setting of BG of 700. He also had focal R arm weakness that has resolved. I would continue keppra for two more days for a total of 7 days after the event. With him demonstrating such improvement, I do not think that any further testing is needed.   As far as driving is concerned, I do think that this was a provoked seizure, but would favor repeat EEG off of antiepileptics before resuming driving.   Neurological Diagnoses: 1)Seizure due to hyperglycemia  Recommendations: 1) would favor not driving until released by outpatient neurologist.  2) I have requested outpatient neurology follow up. 2) Neurology to sign off at this time. Please call with any further questions or concerns.  [name], MD [facility] [phone]  If 7pm- 7am, please page neurology on call as listed in [place].

## 2016-11-29 NOTE — Progress Notes (Signed)
[facility] Progress Note   Background: 61 yo AAM w/ESRD 2/2 DM, on CCPD at home, HTN, anemia, secondary HPT. Admitted w/severe HA/HTN/acute encephalopathy, stroke like sx/seizure though no CT/MRI evidence for acute stroke or PRES. Felt possible 2/2 uncontrolled DM w/BS >600 per Neuro.  Assessment:  1. Seizures new onset - IV Keppra. Neurology following. Dose reduced to 500/day from 1 gram. Not felt will need long term. Working dx for sz + focal defects =>  severe hyperglycemia (per neuro). Slow progress 2. ESRD - on CCPD PTA. Will need substantial neurologic improvement in order to resume CCPD. Fortunately has functional L AVF and can do HD pending neuro recovery. Nutritionally compromised as well w/albumin now <2, will plan to switch over to HD for the time being . Next HD 1/4. Will need to arrange for out pt HD.   3. HTN: Amlodipine 10 mg PO q hs/hydralazine 100 TID and metoprolol 100 BID.  Control inadequate. Add Losartan.  4. Anemia HGB drifting down. Fe stores OK. Started Aranesp w/HD 5. DM - severe hyperglycemia on admission. Better control now, off insulin drip. Erratic BS's. 6. Secondary HPT - resume binders (takes Tums Ultra at home)   Plan: Transition to HD - next TMT 1/4. Arrange for outpt HD. Started Aranesp 60. Resumed phos binders. Add losartan 50 BID. Neuro managing sz meds. Will need attention to DM management at discharge (he HAS a primary care MD so he has told me, but sees only infrequently if at all...) Weekly flush to PD cath. Dressing changes to PD cath. OT/PT. Hope eventual return to PD in outpt setting.  Jamal Maes, MD [facility] [phone] Pager 11/29/2016, 12:25 PM  Subjective:   Had HD yesterday Clotted a system, restrung, no issues after that and well tolerated Transitioning to HD until neuro and nutrition back to baseline More interactive today but still VERY slow, no recollection of events PTA Speech very deliberate Starting  OT/PT evaluation   Objective Vitals:   11/29/16 0900 11/29/16 1000 11/29/16 1100 11/29/16 1200  BP: (!) 144/94 (!) 167/92 (!) 157/88 (!) 168/97  Pulse:  97 93 92  Resp: (!) 35 (!) 25 (!) 38 (!) 40  Temp:    98.7 F (37.1 C)  TempSrc:    Oral  SpO2:  95% 94% 91%  Weight:      Height:       Physical Exam General: WN,WD NAD Edentulous No longer looks like facial droop Lungs clear B2W4 No S3 1/6 systolic murmur LSB  No diastolic murmur or rub Abd soft, PD cath exit site dressing in place No LE edema Dialysis Access: LUA AVF + PD cath RUQ Drsg CDI.    Recent Labs Lab 11/25/16 1649 11/26/16 0453 11/26/16 1643 11/27/16 0822 11/28/16 0413 11/29/16 0449  NA  --  135  --  136 135 137  K  --  5.0  --  5.2* 4.4 4.4  CL  --  98*  --  99* 96* 100*  CO2  --  26  --  26 25 27   GLUCOSE  --  313*  --  299* 381* 173*  BUN  --  80*  --  69* 67* 34*  CREATININE  --  12.08*  --  10.72* 10.46* 6.57*  CALCIUM  --  8.1*  --  7.9* 7.9* 8.1*  PHOS 6.6* 7.6* 7.9*  --   --   --     Recent Labs Lab 11/27/16 1324 11/28/16 0413 11/29/16 0449  AST 15 20 22   ALT 13* 13* 15*  ALKPHOS 50 49 52  BILITOT 0.5 0.5 0.3  PROT 5.4* 5.6* 5.9*  ALBUMIN 1.7* 1.9* 2.0*     Recent Labs Lab 11/24/16 1758  11/24/16 2236 11/25/16 0732 11/26/16 0453 11/27/16 0822 11/28/16 0413  WBC 6.7  --  6.1 8.8 9.5 8.6 8.0  NEUTROABS 3.3  --   --   --   --   --   --   HGB 13.6  < > 12.7* 11.5* 11.2* 10.8* 10.4*  HCT 43.0  < > 39.2 36.4* 34.2* 32.6* 32.4*  MCV 86.9  --  82.9 84.5 82.4 81.1 81.8  PLT 245  --  248 188 151 173 197  < > = values in this interval not displayed.      Component Value Date/Time   SDES BLOOD RIGHT HAND 11/24/2016 2236   SPECREQUEST BOTTLES DRAWN AEROBIC AND ANAEROBIC 5ML 11/24/2016 2236   CULT NO GROWTH 3 DAYS 11/24/2016 2236   REPTSTATUS PENDING 11/24/2016 2236    Recent Labs Lab 11/28/16 1223 11/28/16 2039 11/29/16 0551 11/29/16 0818 11/29/16 1138  GLUCAP 295*  179* 164* 206* 102*   Iron/TIBC/Ferritin/ %Sat    Component Value Date/Time   IRON 78 11/28/2016 1546   TIBC 162 (L) 11/28/2016 1546   IRONPCTSAT 48 (H) 11/28/2016 1546   Studies/Results:Mr Brain Wo Contrast  Result Date: 11/27/2016 CLINICAL DATA:  Unresponsive, hyperglycemia and seizures. History of hypertension, diabetes and end-stage renal disease on dialysis. EXAM: MRI HEAD WITHOUT CONTRAST TECHNIQUE: Multiplanar, multiecho pulse sequences of the brain and surrounding structures were obtained without intravenous contrast. COMPARISON:  CT HEAD November 24, 2016 FINDINGS: BRAIN: No reduced diffusion to suggest acute ischemia. No susceptibility artifact to suggest hemorrhage. The ventricles and sulci are normal for patient's age. No suspicious parenchymal signal, masses or mass effect. No abnormal extra-axial fluid collections. No extra-axial masses though, contrast enhanced sequences would be more sensitive. Symmetric normal size, morphology and signal of the hippocampi. VASCULAR: Normal major intracranial vascular flow voids present at skull base. SKULL AND UPPER CERVICAL SPINE: No abnormal sellar expansion. No suspicious calvarial bone marrow signal. Craniocervical junction maintained. Moderate LEFT temporomandibular osteoarthrosis. Small RIGHT temporomandibular joint effusion. SINUSES/ORBITS: Trace paranasal sinus mucosal thickening. RIGHT concha bullosa. Small mastoid effusions. Prominent superior ophthalmic veins compatible with ventilation. The included ocular globes and orbital contents are non-suspicious. OTHER: LEFT suboccipital small scalp lipoma. Life-support lines in place. IMPRESSION: Negative MRI head for age. Electronically Signed   By: Elon Alas M.D.   On: 11/27/2016 01:19   Dg Chest Port 1 View  Result Date: 11/26/2016 CLINICAL DATA:  Atelectasis. EXAM: PORTABLE CHEST 1 VIEW COMPARISON:  Radiograph November 24, 2016. FINDINGS: Nasogastric and endotracheal tubes are unchanged  in position. No pneumothorax is noted. Hypoinflation of the lungs is noted with increased bibasilar opacities most consistent with atelectasis or infiltrates. Bony thorax is unremarkable. IMPRESSION: Stable support apparatus. Hypoinflation of the lungs with increased bibasilar atelectasis or infiltrates. Electronically Signed   By: Marijo Conception, M.D.   On: 11/26/2016 07:22    Medications:  . amLODipine  10 mg Oral Daily  . calcitRIOL  0.5 mcg Oral Daily  . darbepoetin (ARANESP) injection - DIALYSIS  60 mcg Intravenous Q Tue-HD  . feeding supplement (NEPRO CARB STEADY)  237 mL Oral BID BM  . gentamicin cream  1 application Topical Daily  . heparin  5,000 Units Subcutaneous Q8H  . hydrALAZINE  100 mg Oral TID  .  insulin aspart  0-20 Units Subcutaneous TID WC  . insulin aspart  0-5 Units Subcutaneous QHS  . insulin NPH Human  10 Units Subcutaneous BID AC & HS  . levETIRAcetam  500 mg Intravenous Q24H  . mouth rinse  15 mL Mouth Rinse BID  . metoprolol tartrate  100 mg Oral BID  . multivitamin  1 tablet Oral QHS  . sevelamer carbonate  800 mg Oral TID WC

## 2016-11-29 NOTE — Evaluation (Addendum)
Physical Therapy Evaluation Patient Details Name: Jared [name] MRN: [id number] DOB: 03/15/1956 Today's Date: 11/29/2016   History of Present Illness  Patient is a 61 y/o male with hx of ESRD on peritoneal dialysis, CKD, IDDM, HTN presents with AMS and concern for acute stroke with witnessed seizures in the ED. Intubated 12/29-1/1. Glucose >600.  Markedly hypertensive with BP 220s/110s. CT, MRI and CTA head negative.  Question profound hyperglycemia vs hypertensive encephalopathy precipitated his seizure  Clinical Impression  Patient presents with generalized weakness, lethargy, slow processing, impaired standing balance and decreased overall mobility s/p above. Tolerated standing and taking a few steps to chair with Max A of 2 due to bil knees buckling and lethargy. Pt lives at home with sister and is independent PTA. Would benefit from CIR to maximize independence and mobility prior to return home. Will follow acutely.     Follow Up Recommendations CIR    Equipment Recommendations  Other (comment) (TBA)    Recommendations for Other Services Rehab consult;Speech consult     Precautions / Restrictions Precautions Precautions: Fall Precaution Comments: peritoneal access Restrictions Weight Bearing Restrictions: No      Mobility  Bed Mobility Overal bed mobility: Needs Assistance Bed Mobility: Supine to Sit     Supine to sit: Mod assist;HOB elevated     General bed mobility comments: Assist to initiate hips and to elevate trunk to get to EOB.   Transfers Overall transfer level: Needs assistance Equipment used: 2 person hand held assist Transfers: Sit to/from [facility] Sit to Stand: Mod assist;+2 physical assistance Stand pivot transfers: Max assist       General transfer comment: Assist of 2 to power to standing with cues for hip/knee extension and upright. Able to take a few steps to get to chair with Max A for balance, weigh shifting. pt with bil  partial knee buckling and sitting prematurely being lowered into chair.  Ambulation/Gait                Stairs            Wheelchair Mobility    Modified Rankin (Stroke Patients Only) Modified Rankin (Stroke Patients Only) Pre-Morbid Rankin Score: No symptoms Modified Rankin: Moderately severe disability     Balance Overall balance assessment: Needs assistance Sitting-balance support: Feet supported;Single extremity supported Sitting balance-Leahy Scale: Fair     Standing balance support: During functional activity;Bilateral upper extremity supported Standing balance-Leahy Scale: Poor Standing balance comment: reliant on external support for standing balance; cues for hip ext for upright.                             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      Hand Dominance   Dominant Hand: Right    Extremity/Trunk Assessment   Upper Extremity Assessment Upper Extremity Assessment: Defer to OT evaluation    Lower Extremity Assessment Lower Extremity Assessment: Generalized weakness (Grossly ~2+/5 throughout)       Communication  Communication: HOH  Cognition Arousal/Alertness: Lethargic Behavior During Therapy: Flat affect Overall Cognitive Status: Impaired/Different from baseline Area of Impairment: Problem solving;Attention   Current Attention Level: Sustained         Problem Solving: Slow  processing;Decreased initiation;Requires verbal cues;Requires tactile cues General Comments: A&Ox4; difficult to understand at times due to muffled speech.    General Comments      Exercises     Assessment/Plan    PT Assessment Patient needs continued PT services  PT Problem List Decreased strength;Decreased mobility;Decreased activity tolerance;Decreased cognition;Decreased balance          PT Treatment Interventions DME instruction;Gait training;Therapeutic exercise;Patient/family education;Therapeutic activities;Balance training;Stair training;Functional mobility training;Neuromuscular re-education    PT Goals (Current goals can be found in the Care Plan section)  Acute Rehab PT Goals Patient Stated Goal: none stated PT Goal Formulation: With patient Time For Goal Achievement: 12/13/16 Potential to Achieve Goals: Fair    Frequency Min 3X/week   Barriers to discharge Inaccessible home environment flight of stairs to get into home; not sure how much sister can help    Co-evaluation PT/OT/SLP Co-Evaluation/Treatment: Yes Reason for Co-Treatment: Complexity of the patient's impairments (multi-system involvement);For patient/therapist safety;To address functional/ADL transfers PT goals addressed during session: Mobility/safety with mobility;Strengthening/ROM;Balance         End of Session Equipment Utilized During Treatment: Gait belt Activity Tolerance: Patient limited by lethargy Patient left: in chair;with call bell/phone within reach;with chair alarm set Nurse Communication: Mobility status         Time: 6004-5997 PT Time Calculation (min) (ACUTE ONLY): 18 min   Charges:   PT Evaluation $PT Eval Moderate Complexity: 1 Procedure     PT G Codes:        Jaz Laningham A Anetta Olvera 11/29/2016, 11:38 AM Wray Kearns, PT, DPT [phone]

## 2016-11-30 ENCOUNTER — Inpatient Hospital Stay (HOSPITAL_COMMUNITY): Payer: Medicare Other

## 2016-11-30 ENCOUNTER — Encounter (HOSPITAL_COMMUNITY): Payer: Self-pay | Admitting: Diagnostic Radiology

## 2016-11-30 HISTORY — PX: IR GENERIC HISTORICAL: IMG1180011

## 2016-11-30 LAB — CULTURE, BLOOD (ROUTINE X 2)
CULTURE: NO GROWTH
Culture: NO GROWTH

## 2016-11-30 LAB — CBC
HEMATOCRIT: 30.7 % — AB (ref 39.0–52.0)
HEMOGLOBIN: 9.9 g/dL — AB (ref 13.0–17.0)
MCH: 26.7 pg (ref 26.0–34.0)
MCHC: 32.2 g/dL (ref 30.0–36.0)
MCV: 82.7 fL (ref 78.0–100.0)
Platelets: 197 10*3/uL (ref 150–400)
RBC: 3.71 MIL/uL — AB (ref 4.22–5.81)
RDW: 16.6 % — AB (ref 11.5–15.5)
WBC: 5.6 10*3/uL (ref 4.0–10.5)

## 2016-11-30 LAB — BASIC METABOLIC PANEL
ANION GAP: 12 (ref 5–15)
BUN: 48 mg/dL — AB (ref 6–20)
CHLORIDE: 98 mmol/L — AB (ref 101–111)
CO2: 26 mmol/L (ref 22–32)
Calcium: 8.4 mg/dL — ABNORMAL LOW (ref 8.9–10.3)
Creatinine, Ser: 8.58 mg/dL — ABNORMAL HIGH (ref 0.61–1.24)
GFR calc Af Amer: 7 mL/min — ABNORMAL LOW (ref >60)
GFR, EST NON AFRICAN AMERICAN: 6 mL/min — AB (ref >60)
Glucose, Bld: 171 mg/dL — ABNORMAL HIGH (ref 65–99)
POTASSIUM: 4.4 mmol/L (ref 3.5–5.1)
SODIUM: 136 mmol/L (ref 135–145)

## 2016-11-30 LAB — MAGNESIUM: Magnesium: 2 mg/dL (ref 1.7–2.4)

## 2016-11-30 LAB — GLUCOSE, CAPILLARY
GLUCOSE-CAPILLARY: 158 mg/dL — AB (ref 65–99)
GLUCOSE-CAPILLARY: 173 mg/dL — AB (ref 65–99)
Glucose-Capillary: 166 mg/dL — ABNORMAL HIGH (ref 65–99)

## 2016-11-30 LAB — PHOSPHORUS: PHOSPHORUS: 4.7 mg/dL — AB (ref 2.5–4.6)

## 2016-11-30 MED ORDER — HEPARIN SODIUM (PORCINE) 1000 UNIT/ML IJ SOLN
INTRAMUSCULAR | Status: AC
  Administered 2016-11-30: 2000 [IU]
  Administered 2016-11-30: 3000 [IU]
  Filled 2016-11-30: qty 1

## 2016-11-30 MED ORDER — CALCITRIOL 0.5 MCG PO CAPS
ORAL_CAPSULE | ORAL | Status: AC
  Filled 2016-11-30: qty 1

## 2016-11-30 MED ORDER — FENTANYL CITRATE (PF) 100 MCG/2ML IJ SOLN
50.0000 ug | Freq: Once | INTRAMUSCULAR | Status: AC
  Administered 2016-11-30 (×2): 50 ug via INTRAVENOUS

## 2016-11-30 MED ORDER — LIDOCAINE HCL (PF) 1 % IJ SOLN
INTRAMUSCULAR | Status: DC | PRN
  Administered 2016-11-30: 10 mL

## 2016-11-30 MED ORDER — FENTANYL CITRATE (PF) 100 MCG/2ML IJ SOLN
INTRAMUSCULAR | Status: AC
  Administered 2016-11-30: 50 ug via INTRAVENOUS
  Filled 2016-11-30: qty 2

## 2016-11-30 MED ORDER — ALTEPLASE 2 MG IJ SOLR
INTRAMUSCULAR | Status: AC
Start: 2016-11-30 — End: 2016-11-30
  Administered 2016-11-30: 2 mg
  Filled 2016-11-30: qty 2

## 2016-11-30 MED ORDER — IOPAMIDOL (ISOVUE-300) INJECTION 61%
INTRAVENOUS | Status: AC
  Administered 2016-11-30: 30 mL
  Filled 2016-11-30: qty 50

## 2016-11-30 MED ORDER — LIDOCAINE HCL (PF) 1 % IJ SOLN
INTRAMUSCULAR | Status: AC
  Filled 2016-11-30: qty 30

## 2016-11-30 MED ORDER — IOPAMIDOL (ISOVUE-300) INJECTION 61%
INTRAVENOUS | Status: AC
  Administered 2016-11-30: 80 mL
  Filled 2016-11-30: qty 100

## 2016-11-30 NOTE — Care Management Important Message (Signed)
Important Message  Patient Details  Name: Jared [name] MRN: [id number] Date of Birth: 03-May-1956   Medicare Important Message Given:  Yes    Jared [name] Montine Circle 11/30/2016, 1:44 PM

## 2016-11-30 NOTE — Progress Notes (Signed)
PT Cancellation Note  Patient Details Name: Jared [name] MRN: [id number] DOB: 1956/11/14   Cancelled Treatment:    Reason Eval/Treat Not Completed: Patient at procedure or test/unavailable.  Pt in endo on arrival.  Will see pt as able. 11/30/2016  Donnella Sham, [place] [phone]  (pager)   Jared [name] 11/30/2016, 10:00 AM

## 2016-11-30 NOTE — Care Management Note (Signed)
Case Management Note  Patient Details  Name: Jared [name] MRN: [id number] Date of Birth: 10/13/1956  Subjective/Objective:   Pt admitted on 11/24/16 with possible seizures, uncontrolled DM with glucose >600 on admission.  PTA, pt resided at home with his sister, [name].  He is on peritoneal dialysis at home.                    Action/Plan: CIR consult in progress.  Pt states sisters can provide assistance at dc.  Will follow progress.    Expected Discharge Date:                  Expected Discharge Plan:  [place]  In-House Referral:     Discharge planning Services  CM Consult  Post Acute Care Choice:    Choice offered to:     DME Arranged:    DME Agency:     HH Arranged:    [place] Agency:     Status of Service:  In process, will continue to follow  If discussed at Long Length of Stay Meetings, dates discussed:    Additional Comments:  Ella Bodo, RN 11/30/2016, 10:11 AM

## 2016-11-30 NOTE — Procedures (Addendum)
I was present at this dialysis session. I have reviewed the session itself and made appropriate changes.   Using AVF.  Pt feels weak but is AAO to self, location, year, month.  Cont on THS schedule.  CIR eval ongoing. CLIP process initiated.   Update: HD RN notes pulling clots, high access pressures, low Qb; will ask IR to perform angiogram. RS  Filed Weights   11/29/16 1749 11/29/16 2125 11/30/16 0743  Weight: 78.2 kg (172 lb 6.4 oz) 77.6 kg (171 lb 1.6 oz) 76.3 kg (168 lb 3.4 oz)     Recent Labs Lab 11/30/16 0526  NA 136  K 4.4  CL 98*  CO2 26  GLUCOSE 171*  BUN 48*  CREATININE 8.58*  CALCIUM 8.4*  PHOS 4.7*     Recent Labs Lab 11/24/16 1758  11/27/16 0822 11/28/16 0413 11/30/16 0526  WBC 6.7  < > 8.6 8.0 5.6  NEUTROABS 3.3  --   --   --   --   HGB 13.6  < > 10.8* 10.4* 9.9*  HCT 43.0  < > 32.6* 32.4* 30.7*  MCV 86.9  < > 81.1 81.8 82.7  PLT 245  < > 173 197 197  < > = values in this interval not displayed.  Scheduled Meds: . amLODipine  10 mg Oral Daily  . calcitRIOL  0.5 mcg Oral Daily  . calcium carbonate  400 mg of elemental calcium Oral TID WC  . darbepoetin (ARANESP) injection - DIALYSIS  60 mcg Intravenous Q Tue-HD  . feeding supplement (NEPRO CARB STEADY)  237 mL Oral BID BM  . gentamicin cream  1 application Topical Daily  . heparin  5,000 Units Subcutaneous Q8H  . hydrALAZINE  100 mg Oral TID  . insulin aspart  0-20 Units Subcutaneous TID WC  . insulin aspart  0-5 Units Subcutaneous QHS  . insulin NPH Human  10 Units Subcutaneous BID AC & HS  . levETIRAcetam  500 mg Intravenous Q24H  . losartan  50 mg Oral BID  . mouth rinse  15 mL Mouth Rinse BID  . metoprolol tartrate  100 mg Oral BID  . multivitamin  1 tablet Oral QHS  . sevelamer carbonate  800 mg Oral TID WC   Continuous Infusions: PRN Meds:.sodium chloride, labetalol   Pearson Grippe  MD 11/30/2016, 9:03 AM

## 2016-11-30 NOTE — Progress Notes (Signed)
PROGRESS NOTE    Jared [name]  SJG:[id number] DOB: 09-12-1956 DOA: 11/24/2016 PCP: Lucrezia Starch, MD    Brief Narrative:   17M with PMH significant for IDDM, ESRD on PD, HTN, who presented with concern for acute stroke and had witnessed seizures in the ED. CT and CTA of the head were negative for signs of stroke. The thought is that profound hyperglycemia vs hypertensive encephalopathy precipitated his seizure.  Assessment & Plan:   Active Problems:   Seizure ([place]) - stable neurology managing   DM - continue current regimen, stable    Acute respiratory failure with hypoxia ([place]) - resolved    Hypertension - Hydralazine - continue losartan    CKD (chronic kidney disease) stage V requiring chronic dialysis [facility]) - nephrology consulted   DVT prophylaxis: Heparin Code Status: Full Family Communication:  Disposition Plan: pending improvement in condition   Consultants:   Neurology  Nephrology   Procedures:    Antimicrobials: none   Subjective: Pt has no new complaints  Objective: Vitals:   11/30/16 1000 11/30/16 1030 11/30/16 1045 11/30/16 1138  BP: (!) 178/90 (!) 186/99 (!) 167/97 (!) 165/98  Pulse: 91 94 92 95  Resp: (!) 24 17 18 18   Temp:   97.1 F (36.2 C) 97.2 F (36.2 C)  TempSrc:   Oral Oral  SpO2:    98%  Weight:   75.6 kg (166 lb 10.7 oz)   Height:        Intake/Output Summary (Last 24 hours) at 11/30/16 1519 Last data filed at 11/30/16 1045  Gross per 24 hour  Intake              665 ml  Output              200 ml  Net              465 ml   Filed Weights   11/29/16 2125 11/30/16 0743 11/30/16 1045  Weight: 77.6 kg (171 lb 1.6 oz) 76.3 kg (168 lb 3.4 oz) 75.6 kg (166 lb 10.7 oz)    Examination:  General exam: Appears calm and comfortable, in nad. Respiratory system: Clear to auscultation. Respiratory effort normal. Cardiovascular system: S1 & S2 heard, RRR. No JVD, murmurs, rubs, gallops or clicks. No pedal  edema. Gastrointestinal system: Abdomen is nondistended, soft and nontender. No organomegaly or masses felt. Normal bowel sounds heard. Central nervous system: Alert and oriented. No facial asymmetry  Extremities: warm, no cyanosis Skin: No rashes, lesions or ulcers, on limited exam. Psychiatry: Mood & affect appropriate.   Data Reviewed: I have personally reviewed following labs and imaging studies  CBC:  Recent Labs Lab 11/24/16 1758  11/25/16 0732 11/26/16 0453 11/27/16 0822 11/28/16 0413 11/30/16 0526  WBC 6.7  < > 8.8 9.5 8.6 8.0 5.6  NEUTROABS 3.3  --   --   --   --   --   --   HGB 13.6  < > 11.5* 11.2* 10.8* 10.4* 9.9*  HCT 43.0  < > 36.4* 34.2* 32.6* 32.4* 30.7*  MCV 86.9  < > 84.5 82.4 81.1 81.8 82.7  PLT 245  < > 188 151 173 197 197  < > = values in this interval not displayed. Basic Metabolic Panel:  Recent Labs Lab 11/25/16 0952 11/25/16 1649 11/26/16 0453 11/26/16 1643 11/27/16 0822 11/28/16 0413 11/29/16 0449 11/30/16 0526  NA  --   --  135  --  136 135 137 136  K  --   --  5.0  --  5.2* 4.4 4.4 4.4  CL  --   --  98*  --  99* 96* 100* 98*  CO2  --   --  26  --  26 25 27 26   GLUCOSE  --   --  313*  --  299* 381* 173* 171*  BUN  --   --  80*  --  69* 67* 34* 48*  CREATININE  --   --  12.08*  --  10.72* 10.46* 6.57* 8.58*  CALCIUM  --   --  8.1*  --  7.9* 7.9* 8.1* 8.4*  MG 1.9 1.8 2.1 2.0  --   --   --  2.0  PHOS 4.5 6.6* 7.6* 7.9*  --   --   --  4.7*   GFR: Estimated Creatinine Clearance: 9.2 mL/min (by C-G formula based on SCr of 8.58 mg/dL (H)). Liver Function Tests:  Recent Labs Lab 11/24/16 1758 11/26/16 0453 11/27/16 0822 11/28/16 0413 11/29/16 0449  AST 32 20 15 20 22   ALT 22 15* 13* 13* 15*  ALKPHOS 69 45 50 49 52  BILITOT 0.6 0.3 0.5 0.5 0.3  PROT 7.5 5.3* 5.4* 5.6* 5.9*  ALBUMIN 3.1* 1.8* 1.7* 1.9* 2.0*   No results for input(s): LIPASE, AMYLASE in the last 168 hours.  Recent Labs Lab 11/27/16 0931  AMMONIA 49*    Coagulation Profile:  Recent Labs Lab 11/24/16 1758  INR 1.17   Cardiac Enzymes: No results for input(s): CKTOTAL, CKMB, CKMBINDEX, TROPONINI in the last 168 hours. BNP (last 3 results) No results for input(s): PROBNP in the last 8760 hours. HbA1C: No results for input(s): HGBA1C in the last 72 hours. CBG:  Recent Labs Lab 11/29/16 0818 11/29/16 1138 11/29/16 1748 11/29/16 2112 11/30/16 1138  GLUCAP 206* 102* 144* 155* 158*   Lipid Profile: No results for input(s): CHOL, HDL, LDLCALC, TRIG, CHOLHDL, LDLDIRECT in the last 72 hours. Thyroid Function Tests: No results for input(s): TSH, T4TOTAL, FREET4, T3FREE, THYROIDAB in the last 72 hours. Anemia Panel:  Recent Labs  11/28/16 1546  TIBC 162*  IRON 78   Sepsis Labs:  Recent Labs Lab 11/24/16 1809  LATICACIDVEN 12.94*    Recent Results (from the past 240 hour(s))  Culture, blood (Routine X 2) w Reflex to ID Panel     Status: None (Preliminary result)   Collection Time: 11/24/16 10:30 PM  Result Value Ref Range Status   Specimen Description BLOOD RIGHT ARM  Final   Special Requests BOTTLES DRAWN AEROBIC AND ANAEROBIC 5ML  Final   Culture NO GROWTH 4 DAYS  Final   Report Status PENDING  Incomplete  Culture, blood (Routine X 2) w Reflex to ID Panel     Status: None (Preliminary result)   Collection Time: 11/24/16 10:36 PM  Result Value Ref Range Status   Specimen Description BLOOD RIGHT HAND  Final   Special Requests BOTTLES DRAWN AEROBIC AND ANAEROBIC 5ML  Final   Culture NO GROWTH 4 DAYS  Final   Report Status PENDING  Incomplete  MRSA PCR Screening     Status: None   Collection Time: 11/24/16 11:18 PM  Result Value Ref Range Status   MRSA by PCR NEGATIVE NEGATIVE Final    Comment:        The GeneXpert MRSA Assay (FDA approved for NASAL specimens only), is one component of a comprehensive MRSA colonization surveillance program. It is not intended to diagnose MRSA infection nor to guide  or monitor  treatment for MRSA infections.          Radiology Studies: No results found.      Scheduled Meds: . iopamidol      . lidocaine (PF)      . amLODipine  10 mg Oral Daily  . calcitRIOL      . calcitRIOL  0.5 mcg Oral Daily  . calcium carbonate  400 mg of elemental calcium Oral TID WC  . darbepoetin (ARANESP) injection - DIALYSIS  60 mcg Intravenous Q Tue-HD  . feeding supplement (NEPRO CARB STEADY)  237 mL Oral BID BM  . gentamicin cream  1 application Topical Daily  . heparin  5,000 Units Subcutaneous Q8H  . hydrALAZINE  100 mg Oral TID  . insulin aspart  0-20 Units Subcutaneous TID WC  . insulin aspart  0-5 Units Subcutaneous QHS  . insulin NPH Human  10 Units Subcutaneous BID AC & HS  . levETIRAcetam  500 mg Intravenous Q24H  . losartan  50 mg Oral BID  . mouth rinse  15 mL Mouth Rinse BID  . metoprolol tartrate  100 mg Oral BID  . multivitamin  1 tablet Oral QHS  . sevelamer carbonate  800 mg Oral TID WC   Continuous Infusions:   LOS: 6 days    Time spent: > 35 minutes   Velvet Bathe, MD [facility] Pager [phone]  If 7PM-7AM, please contact night-coverage [email or web address] Password [email or web address] 11/30/2016, 3:19 PM

## 2016-11-30 NOTE — Progress Notes (Addendum)
Inpatient Rehabilitation  I met with patient and 2 of his sisters at the bedside to discuss team's recommendations for IP Rehab.  Shared booklets and answered questions.  His sisters felt like rehab before going home would be best to get him stronger and independent.  However, they defer the decision to their other sister, [name] who the patient lives with.  I have called and left a message for [facility] and await a call back.  Plan to follow up with team as I know and follow along for timing of medical readiness.  Please call with questions.   Carmelia Roller., CCC/SLP Admission Coordinator  [facility]  Cell [phone]

## 2016-11-30 NOTE — Procedures (Signed)
Post-Procedure Note  Pre-operative Diagnosis: Venous stenosis.       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     Indications:  Poor flows in left arm AV fistula.   Procedure Details:   Left arm fistulogram performed.  Prepped arm and planned for venous angioplasty and possible stent placement. After placement of venous sheath, the outflow vein thrombosed and required a declot procedure with TPA, Angiojet thrombectomy, balloon angioplasty and placement of venous stent. Successful declot procedure and successful treatment of the main venous stenosis with balloon angioplasty and stent placement.    Findings: Multifocal narrowing in left upper arm cephalic vein.   After placement of sheath, cephalic vein thrombosed between the elbow and upper humerus.  Successful declot procedure.  Persistent stenosis in upper arm cephalic vein after multiple angioplasty procedures.  As a result, this venous stenosis was treated with 8 mm x 40 mm stent.  Stented segment was patent at end of procedure.    Complications: None     Condition: Stable  Plan: Remove purse string sutures on 12/01/16.

## 2016-11-30 NOTE — Progress Notes (Signed)
PT Cancellation Note  Patient Details Name: Jared [name] MRN: [id number] DOB: November 26, 1956   Cancelled Treatment:    Reason Eval/Treat Not Completed: Patient at procedure or test/unavailable.  Pt in HD this am and presently going for angiogram of fistula in IR.  Will see 1/5 as able. 11/30/2016  Jared [name], [place] [phone]  (pager)   Jared [name] 11/30/2016, 2:07 PM

## 2016-12-01 ENCOUNTER — Inpatient Hospital Stay (HOSPITAL_COMMUNITY)
Admission: RE | Admit: 2016-12-01 | Discharge: 2016-12-09 | DRG: 947 | Disposition: A | Discharge status: Home Health | Payer: Medicare Other | Source: Intra-hospital | Attending: Physical Medicine & Rehabilitation | Admitting: Physical Medicine & Rehabilitation

## 2016-12-01 DIAGNOSIS — N400 Enlarged prostate without lower urinary tract symptoms: Secondary | ICD-10-CM

## 2016-12-01 DIAGNOSIS — Z87891 Personal history of nicotine dependence: Secondary | ICD-10-CM | POA: Diagnosis not present

## 2016-12-01 DIAGNOSIS — E1165 Type 2 diabetes mellitus with hyperglycemia: Secondary | ICD-10-CM

## 2016-12-01 DIAGNOSIS — R109 Unspecified abdominal pain: Secondary | ICD-10-CM | POA: Diagnosis not present

## 2016-12-01 DIAGNOSIS — R569 Unspecified convulsions: Secondary | ICD-10-CM

## 2016-12-01 DIAGNOSIS — K5900 Constipation, unspecified: Secondary | ICD-10-CM

## 2016-12-01 DIAGNOSIS — R5381 Other malaise: Secondary | ICD-10-CM | POA: Diagnosis not present

## 2016-12-01 DIAGNOSIS — G9340 Encephalopathy, unspecified: Secondary | ICD-10-CM

## 2016-12-01 DIAGNOSIS — D638 Anemia in other chronic diseases classified elsewhere: Secondary | ICD-10-CM | POA: Diagnosis not present

## 2016-12-01 DIAGNOSIS — E1122 Type 2 diabetes mellitus with diabetic chronic kidney disease: Secondary | ICD-10-CM | POA: Diagnosis not present

## 2016-12-01 DIAGNOSIS — D631 Anemia in chronic kidney disease: Secondary | ICD-10-CM

## 2016-12-01 DIAGNOSIS — M898X9 Other specified disorders of bone, unspecified site: Secondary | ICD-10-CM | POA: Diagnosis not present

## 2016-12-01 DIAGNOSIS — Z79899 Other long term (current) drug therapy: Secondary | ICD-10-CM

## 2016-12-01 DIAGNOSIS — Z794 Long term (current) use of insulin: Secondary | ICD-10-CM | POA: Diagnosis not present

## 2016-12-01 DIAGNOSIS — I120 Hypertensive chronic kidney disease with stage 5 chronic kidney disease or end stage renal disease: Secondary | ICD-10-CM | POA: Diagnosis not present

## 2016-12-01 DIAGNOSIS — N186 End stage renal disease: Secondary | ICD-10-CM

## 2016-12-01 DIAGNOSIS — Z992 Dependence on renal dialysis: Secondary | ICD-10-CM

## 2016-12-01 DIAGNOSIS — N2889 Other specified disorders of kidney and ureter: Secondary | ICD-10-CM | POA: Diagnosis not present

## 2016-12-01 DIAGNOSIS — I10 Essential (primary) hypertension: Secondary | ICD-10-CM | POA: Diagnosis present

## 2016-12-01 DIAGNOSIS — I150 Renovascular hypertension: Secondary | ICD-10-CM | POA: Diagnosis not present

## 2016-12-01 DIAGNOSIS — N2581 Secondary hyperparathyroidism of renal origin: Secondary | ICD-10-CM | POA: Diagnosis not present

## 2016-12-01 DIAGNOSIS — E1129 Type 2 diabetes mellitus with other diabetic kidney complication: Secondary | ICD-10-CM | POA: Diagnosis not present

## 2016-12-01 DIAGNOSIS — R140 Abdominal distension (gaseous): Secondary | ICD-10-CM

## 2016-12-01 DIAGNOSIS — G40909 Epilepsy, unspecified, not intractable, without status epilepticus: Secondary | ICD-10-CM | POA: Diagnosis present

## 2016-12-01 DIAGNOSIS — N189 Chronic kidney disease, unspecified: Secondary | ICD-10-CM

## 2016-12-01 DIAGNOSIS — I6789 Other cerebrovascular disease: Secondary | ICD-10-CM

## 2016-12-01 LAB — GLUCOSE, CAPILLARY
GLUCOSE-CAPILLARY: 122 mg/dL — AB (ref 65–99)
Glucose-Capillary: 83 mg/dL (ref 65–99)
Glucose-Capillary: 171 mg/dL — ABNORMAL HIGH (ref 65–99)

## 2016-12-01 MED ORDER — PROCHLORPERAZINE 25 MG RE SUPP: 12.5000 mg | Freq: Four times a day (QID) | RECTAL | Status: DC | PRN

## 2016-12-01 MED ORDER — INSULIN ASPART 100 UNIT/ML ~~LOC~~ SOLN
0.0000 [IU] | Freq: Three times a day (TID) | SUBCUTANEOUS | Status: DC
  Administered 2016-12-01 – 2016-12-02 (×2): 4 [IU] via SUBCUTANEOUS
  Administered 2016-12-02: 3 [IU] via SUBCUTANEOUS
  Administered 2016-12-03 (×2): 7 [IU] via SUBCUTANEOUS
  Administered 2016-12-04: 4 [IU] via SUBCUTANEOUS
  Administered 2016-12-04: 11 [IU] via SUBCUTANEOUS
  Administered 2016-12-04: 3 [IU] via SUBCUTANEOUS
  Administered 2016-12-05: 15 [IU] via SUBCUTANEOUS
  Administered 2016-12-05: 4 [IU] via SUBCUTANEOUS
  Administered 2016-12-06: 7 [IU] via SUBCUTANEOUS
  Administered 2016-12-07: 11 [IU] via SUBCUTANEOUS
  Administered 2016-12-07: 3 [IU] via SUBCUTANEOUS
  Administered 2016-12-08 (×2): 11 [IU] via SUBCUTANEOUS
  Administered 2016-12-09: 15 [IU] via SUBCUTANEOUS
  Administered 2016-12-09: 3 [IU] via SUBCUTANEOUS

## 2016-12-01 MED ORDER — DARBEPOETIN ALFA 60 MCG/0.3ML IJ SOSY: 60.0000 ug | PREFILLED_SYRINGE | INTRAMUSCULAR | Status: DC

## 2016-12-01 MED ORDER — DIPHENHYDRAMINE HCL 12.5 MG/5ML PO ELIX: 12.5000 mg | ORAL_SOLUTION | Freq: Four times a day (QID) | ORAL | Status: DC | PRN

## 2016-12-01 MED ORDER — SEVELAMER CARBONATE 800 MG PO TABS
800.0000 mg | ORAL_TABLET | Freq: Three times a day (TID) | ORAL | Status: DC
  Administered 2016-12-01 – 2016-12-09 (×22): 800 mg via ORAL
  Filled 2016-12-01 (×22): qty 1

## 2016-12-01 MED ORDER — NEPRO/CARBSTEADY PO LIQD
237.0000 mL | Freq: Two times a day (BID) | ORAL | Status: DC
  Administered 2016-12-02 – 2016-12-07 (×9): 237 mL via ORAL

## 2016-12-01 MED ORDER — LOSARTAN POTASSIUM 50 MG PO TABS
50.0000 mg | ORAL_TABLET | Freq: Two times a day (BID) | ORAL | Status: DC
  Administered 2016-12-01 – 2016-12-02 (×3): 50 mg via ORAL
  Filled 2016-12-01 (×3): qty 1

## 2016-12-01 MED ORDER — HEPARIN SODIUM (PORCINE) 5000 UNIT/ML IJ SOLN: 5000.0000 [IU] | Freq: Three times a day (TID) | INTRAMUSCULAR | Status: DC

## 2016-12-01 MED ORDER — INSULIN ASPART 100 UNIT/ML ~~LOC~~ SOLN
0.0000 [IU] | Freq: Every day | SUBCUTANEOUS | Status: DC
  Administered 2016-12-04 – 2016-12-07 (×2): 2 [IU] via SUBCUTANEOUS

## 2016-12-01 MED ORDER — PROCHLORPERAZINE MALEATE 5 MG PO TABS: 5.0000 mg | ORAL_TABLET | Freq: Four times a day (QID) | ORAL | Status: DC | PRN

## 2016-12-01 MED ORDER — LEVETIRACETAM 500 MG PO TABS
500.0000 mg | ORAL_TABLET | Freq: Every day | ORAL | Status: DC
  Administered 2016-12-01: 500 mg via ORAL
  Filled 2016-12-01: qty 1

## 2016-12-01 MED ORDER — AMLODIPINE BESYLATE 10 MG PO TABS
10.0000 mg | ORAL_TABLET | Freq: Every day | ORAL | Status: DC
  Administered 2016-12-02 – 2016-12-09 (×8): 10 mg via ORAL
  Filled 2016-12-01 (×8): qty 1

## 2016-12-01 MED ORDER — ACETAMINOPHEN 325 MG PO TABS
325.0000 mg | ORAL_TABLET | ORAL | Status: DC | PRN
  Administered 2016-12-03 – 2016-12-08 (×8): 650 mg via ORAL
  Filled 2016-12-01 (×8): qty 2

## 2016-12-01 MED ORDER — BISACODYL 10 MG RE SUPP: 10.0000 mg | Freq: Every day | RECTAL | Status: DC | PRN

## 2016-12-01 MED ORDER — PROCHLORPERAZINE EDISYLATE 5 MG/ML IJ SOLN: 5.0000 mg | Freq: Four times a day (QID) | INTRAMUSCULAR | Status: DC | PRN

## 2016-12-01 MED ORDER — INSULIN NPH (HUMAN) (ISOPHANE) 100 UNIT/ML ~~LOC~~ SUSP
10.0000 [IU] | Freq: Two times a day (BID) | SUBCUTANEOUS | Status: DC
  Administered 2016-12-01 – 2016-12-04 (×6): 10 [IU] via SUBCUTANEOUS
  Filled 2016-12-01: qty 10

## 2016-12-01 MED ORDER — HEPARIN SODIUM (PORCINE) 5000 UNIT/ML IJ SOLN
5000.0000 [IU] | Freq: Three times a day (TID) | INTRAMUSCULAR | Status: DC
  Administered 2016-12-01 – 2016-12-09 (×23): 5000 [IU] via SUBCUTANEOUS
  Filled 2016-12-01 (×23): qty 1

## 2016-12-01 MED ORDER — CALCITRIOL 0.5 MCG PO CAPS
0.5000 ug | ORAL_CAPSULE | Freq: Every day | ORAL | Status: DC
  Administered 2016-12-02 – 2016-12-09 (×8): 0.5 ug via ORAL
  Filled 2016-12-01 (×8): qty 1

## 2016-12-01 MED ORDER — CALCIUM CARBONATE ANTACID 500 MG PO CHEW: 400.0000 mg | CHEWABLE_TABLET | Freq: Three times a day (TID) | ORAL | 0 refills | Status: DC

## 2016-12-01 MED ORDER — RENA-VITE PO TABS
1.0000 | ORAL_TABLET | Freq: Every day | ORAL | Status: DC
  Administered 2016-12-01 – 2016-12-08 (×8): 1 via ORAL
  Filled 2016-12-01 (×8): qty 1

## 2016-12-01 MED ORDER — INSULIN NPH (HUMAN) (ISOPHANE) 100 UNIT/ML ~~LOC~~ SUSP: 10.0000 [IU] | Freq: Two times a day (BID) | SUBCUTANEOUS | 0 refills | Status: DC

## 2016-12-01 MED ORDER — LOSARTAN POTASSIUM 50 MG PO TABS: 50.0000 mg | ORAL_TABLET | Freq: Two times a day (BID) | ORAL | 0 refills | Status: DC

## 2016-12-01 MED ORDER — GUAIFENESIN-DM 100-10 MG/5ML PO SYRP
5.0000 mL | ORAL_SOLUTION | Freq: Four times a day (QID) | ORAL | Status: DC | PRN
  Administered 2016-12-02: 10 mL via ORAL
  Filled 2016-12-01: qty 10

## 2016-12-01 MED ORDER — ALUM & MAG HYDROXIDE-SIMETH 200-200-20 MG/5ML PO SUSP
30.0000 mL | ORAL | Status: DC | PRN
  Administered 2016-12-08 – 2016-12-09 (×3): 30 mL via ORAL
  Filled 2016-12-01 (×3): qty 30

## 2016-12-01 MED ORDER — CALCIUM CARBONATE ANTACID 500 MG PO CHEW
400.0000 mg | CHEWABLE_TABLET | Freq: Three times a day (TID) | ORAL | Status: DC
  Administered 2016-12-01 – 2016-12-08 (×17): 400 mg via ORAL
  Filled 2016-12-01 (×20): qty 2

## 2016-12-01 MED ORDER — INSULIN ASPART 100 UNIT/ML ~~LOC~~ SOLN: 0.0000 [IU] | Freq: Three times a day (TID) | SUBCUTANEOUS | 0 refills | Status: DC

## 2016-12-01 MED ORDER — GENTAMICIN SULFATE 0.1 % EX CREA
1.0000 "application " | TOPICAL_CREAM | Freq: Every day | CUTANEOUS | Status: DC
  Administered 2016-12-02 – 2016-12-08 (×7): 1 via TOPICAL
  Filled 2016-12-01: qty 15

## 2016-12-01 MED ORDER — POLYETHYLENE GLYCOL 3350 17 G PO PACK: 17.0000 g | PACK | Freq: Every day | ORAL | Status: DC | PRN

## 2016-12-01 MED ORDER — TRAZODONE HCL 50 MG PO TABS
25.0000 mg | ORAL_TABLET | Freq: Every evening | ORAL | Status: DC | PRN
  Administered 2016-12-02 – 2016-12-08 (×6): 50 mg via ORAL
  Filled 2016-12-01 (×6): qty 1

## 2016-12-01 MED ORDER — METOPROLOL TARTRATE 50 MG PO TABS
100.0000 mg | ORAL_TABLET | Freq: Two times a day (BID) | ORAL | Status: DC
  Administered 2016-12-01 – 2016-12-09 (×16): 100 mg via ORAL
  Filled 2016-12-01 (×16): qty 2

## 2016-12-01 MED ORDER — HYDRALAZINE HCL 50 MG PO TABS
100.0000 mg | ORAL_TABLET | Freq: Three times a day (TID) | ORAL | Status: DC
  Administered 2016-12-01 – 2016-12-09 (×23): 100 mg via ORAL
  Filled 2016-12-01 (×23): qty 2

## 2016-12-01 NOTE — H&P (Signed)
Physical Medicine and Rehabilitation Admission H&P       Chief Complaint  Patient presents with  . Encephalopathy due to seizure    HPI:  Jared [name] a 61 y.o.malewith history of 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 He was intubated for airway protection and was loaded with keppra. CTA head/neck negative for large vessel occlusion. MRI brain negative for acute changes. Seizures felt to be due to hyperglycemia and likely contributing to RUE weakness. Dr. Leonel Ramsay recommends continuing [place] for total of 7 days.  Dose decreased due to sedation and delirium resolving. BP control improving with addition of Losartan and off insulin drip. Transitioned to HD and requried L-AV fistula thrombolysis by radiology on 01/4. Therapy evaluations done revealing generalized weakness with delayed processing and muffled speech. PM&R was consulted and felt that he could benefit from an inpatient rehab admission. Pt is being admitted today    Review of Systems  HENT: Negative for hearing loss.   Eyes: Negative for blurred vision and double vision.  Respiratory: Negative for cough and shortness of breath.   Cardiovascular: Negative for chest pain.  Gastrointestinal: Negative for heartburn and nausea.  Musculoskeletal: Negative for myalgias.  Skin: Negative for itching and rash.  Neurological: Positive for weakness.          Past Medical History:  Diagnosis Date  . Anemia   . Chronic kidney disease    on dialysis M-W-F  . Diabetes mellitus without complication ([place])    onset as adult  . Hypertension   . Thyroid disease    hyperparathyroidism         Past Surgical History:  Procedure Laterality Date  . AV FISTULA PLACEMENT Left 06/04/2014   Procedure: ARTERIOVENOUS (AV) FISTULA CREATION-  BRACHIOCEPHALIC WITH LIGATION OF COMPETEING BRANCH;  Surgeon: Mal Misty, MD;  Location: [facility];  Service: Vascular;  Laterality: Left;  . INSERTION OF DIALYSIS CATHETER  04/2014  . IR GENERIC HISTORICAL Left 11/30/2016   IR THROMBECTOMY AV FISTULA W/THROMBOLYSIS/PTA/STENT INC/SHUNT/IMG LT 11/30/2016 Markus Daft, MD [facility]  . IR GENERIC HISTORICAL  11/30/2016   IR US GUIDE VASC ACCESS LEFT 11/30/2016 Markus Daft, MD [facility]         Family History  Problem Relation Age of Onset  . Cancer - Lung Mother   . Cancer - Other Father     Social History:  Retired. Lives with sister--retired and in good health. Works part time for [employer]. He reports that he quit smoking about 32 years ago. He has never used smokeless tobacco. He reports that he drinks beer occasionally. He reports that he does not use drugs.          Allergies  Allergen Reactions  . Penicillins Other (See Comments)    Has patient had a PCN reaction causing immediate rash, facial/tongue/throat swelling, SOB or lightheadedness with hypotension: Unk Has patient had a PCN reaction causing severe rash involving mucus membranes or skin necrosis: Unk Has patient had a PCN reaction that required hospitalization: Unk Has patient had a PCN reaction occurring within the last 10 years: Unk If all of the above answers are "NO", then may proceed with Cephalosporin use.          Medications Prior to Admission  Medication Sig Dispense Refill  . amLODipine (NORVASC) 10 MG tablet Take 10 mg by mouth daily.    Marland Kitchen  b complex-vitamin c-folic acid (NEPHRO-VITE) 0.8 MG TABS tablet Take 1 tablet by mouth at bedtime.    . calcitRIOL (ROCALTROL) 0.5 MCG capsule Take 0.5 mcg by mouth daily.    . cinacalcet (SENSIPAR) 30 MG tablet Take 30 mg by mouth daily.    . hydrALAZINE (APRESOLINE) 100 MG tablet Take 100 mg by mouth 3 (three) times daily.    . Insulin NPH Human, Isophane, (NOVOLIN N [place]) Inject 4-19 Units into the skin 2 (two) times daily.     . insulin regular  (NOVOLIN R,HUMULIN R) 100 units/mL injection Inject 2-6 Units into the skin 2 (two) times daily before a meal.     . metoprolol (LOPRESSOR) 100 MG tablet Take 100 mg by mouth 2 (two) times daily.     Marland Kitchen oxyCODONE (ROXICODONE) 5 MG immediate release tablet Take 1 tablet (5 mg total) by mouth every 6 (six) hours as needed for severe pain. 10 tablet 0  . sevelamer carbonate (RENVELA) 800 MG tablet Take 800 mg by mouth 3 (three) times daily with meals.      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   Functional Status:  Mobility: Bed Mobility Overal bed mobility: Needs Assistance Bed Mobility: Supine to Sit Supine to sit: Min guard General bed mobility comments: HOB flat, increased time required. Transfers Overall transfer level: Needs assistance Equipment used: Rolling walker (2 wheeled) Transfers: Sit to/from Stand Sit to Stand: Min assist Stand pivot transfers: Max assist General transfer comment: Min assist for balance with sit to stand from EOB x1. Cues for hand placement. Increased time required.      ADL: ADL Overall ADL's : Needs assistance/impaired Grooming: Minimal assistance, Standing, Wash/dry face, Oral care Grooming Details (indicate cue type and reason): Min assist for standing balance. Pt with bil knee buckling, forward flexed posture, and posterior lean when  fatigues. Upper Body Bathing: Maximal assistance Lower Body Bathing: Total assistance Upper Body Dressing : Maximal assistance Lower Body Dressing: Min guard Lower Body Dressing Details (indicate cue type and reason): To don socks in sitting Toilet Transfer: Minimal assistance, Ambulation, Regular Toilet, RW Toilet Transfer Details (indicate cue type and reason): Simulated by sit to stand from EOB with functional mobility. Functional mobility during ADLs: Minimal assistance, Rolling walker General ADL Comments: Continues to demonstrate bil LE weakness and knee buckling. Fatigues quickly with functional activities.  Cognition: Cognition Overall Cognitive Status: Within Functional Limits for tasks assessed Orientation Level: Oriented X4 Cognition Arousal/Alertness: Awake/alert Behavior During Therapy: WFL for tasks assessed/performed Overall Cognitive Status: Within Functional Limits for tasks assessed Area of Impairment: Problem solving, Attention Current Attention Level: Sustained Problem Solving: Slow processing, Decreased initiation, Requires verbal cues, Requires tactile cues General Comments: A&Ox4; difficult to understand at times due to muffled speech. Difficult to assess due to: Level of arousal   Blood pressure (!) 182/87, pulse 96, temperature 99.8 F (37.7 C), temperature source Oral, resp. rate 18, height '5\' 10"'$  (1.778 m), weight 75.8 kg (167 lb 1.7 oz), SpO2 93 %. Physical Exam  Nursing note and  vitals reviewed. Constitutional: He is oriented to person, place, and time. He appears well-developed and well-nourished.  HENT:  Head: Normocephalic and atraumatic.  Right Ear: External ear normal.  Left Ear: External ear normal.  Edentulous.   Eyes: Conjunctivae and EOM are normal. Pupils are equal, round, and reactive to light.  Neck: Normal range of motion. Neck supple. No tracheal deviation present. No thyromegaly present.  Cardiovascular: Normal rate and regular  rhythm.  Exam reveals no gallop and no friction rub.   No murmur heard. Respiratory: Effort normal. No stridor. No respiratory distress. He has no wheezes.  GI: Soft. Bowel sounds are normal. He exhibits no distension. There is no tenderness.  Musculoskeletal: He exhibits edema (at LUE graft site--dry dressing in place. ).  Neurological: He is alert and oriented to person, place, and time.  Slurred, soft slow speech. Fairly alert today. Reasonable insight and awareness.  UE strength 4/5 deltoid, bicep, tricep, wrist and fingers. LE strength 3/5 HF, 4/5 KE and ADF/PF. No gross sensory deficits noted today.   Skin: Skin is warm and dry.  Psychiatric: His speech is delayed and slurred. He is slowed and withdrawn. He expresses inappropriate judgment.  Flat but cooperative    Lab Results Last 48 Hours        Results for orders placed or performed during the hospital encounter of 11/24/16 (from the past 48 hour(s))  Glucose, capillary     Status: Abnormal   Collection Time: 11/29/16  5:48 PM  Result Value Ref Range   Glucose-Capillary 144 (H) 65 - 99 mg/dL  Glucose, capillary     Status: Abnormal   Collection Time: 11/29/16  9:12 PM  Result Value Ref Range   Glucose-Capillary 155 (H) 65 - 99 mg/dL  Basic metabolic panel     Status: Abnormal   Collection Time: 11/30/16  5:26 AM  Result Value Ref Range   Sodium 136 135 - 145 mmol/L   Potassium 4.4 3.5 - 5.1 mmol/L   Chloride 98 (L) 101 - 111 mmol/L   CO2 26 22 - 32 mmol/L   Glucose, Bld 171 (H) 65 - 99 mg/dL   BUN 48 (H) 6 - 20 mg/dL   Creatinine, Ser 6.79 (H) 0.61 - 1.24 mg/dL   Calcium 8.4 (L) 8.9 - 10.3 mg/dL   GFR calc non Af Amer 6 (L) >60 mL/min   GFR calc Af Amer 7 (L) >60 mL/min    Comment: (NOTE) The eGFR has been calculated using the CKD EPI equation. This calculation has not been validated in all clinical situations. eGFR's persistently <60 mL/min signify possible Chronic Kidney Disease.   Anion gap 12  5 - 15  CBC     Status: Abnormal   Collection Time: 11/30/16  5:26 AM  Result Value Ref Range   WBC 5.6 4.0 - 10.5 K/uL   RBC 3.71 (L) 4.22 - 5.81 MIL/uL   Hemoglobin 9.9 (L) 13.0 - 17.0 g/dL   HCT 80.9 (L) 13.8 - 39.1 %   MCV 82.7 78.0 - 100.0 fL   MCH 26.7 26.0 - 34.0 pg   MCHC 32.2 30.0 - 36.0 g/dL   RDW 97.3 (H) 76.2 - 23.3 %   Platelets 197 150 - 400 K/uL  Magnesium     Status: None   Collection Time: 11/30/16  5:26 AM  Result Value Ref Range   Magnesium 2.0 1.7 - 2.4 mg/dL  Phosphorus     Status: Abnormal   Collection Time: 11/30/16  5:26  AM  Result Value Ref Range   Phosphorus 4.7 (H) 2.5 - 4.6 mg/dL  Glucose, capillary     Status: Abnormal   Collection Time: 11/30/16 11:38 AM  Result Value Ref Range   Glucose-Capillary 158 (H) 65 - 99 mg/dL  Glucose, capillary     Status: Abnormal   Collection Time: 11/30/16  5:52 PM  Result Value Ref Range   Glucose-Capillary 173 (H) 65 - 99 mg/dL  Glucose, capillary     Status: Abnormal   Collection Time: 11/30/16  8:48 PM  Result Value Ref Range   Glucose-Capillary 166 (H) 65 - 99 mg/dL  Glucose, capillary     Status: None   Collection Time: 12/01/16  8:09 AM  Result Value Ref Range   Glucose-Capillary 83 65 - 99 mg/dL      Imaging Results (Last 48 hours)  Ir US Guide Vasc Access Left  Result Date: 11/30/2016 INDICATION: 61 year old with end-stage renal disease. Patient has a left upper arm brachiocephalic fistula with poor flows and pulling clots. EXAM: AV FISTULA DECLOT WITH THROMBOLYSIS, MECHANICAL THROMBECTOMY, BALLOON ANGIOPLASTY AND STENT PLACEMENT ULTRASOUND GUIDANCE FOR VASCULAR ACCESS Physician: Stephan Minister. Anselm Pancoast, MD MEDICATIONS: None. ANESTHESIA/SEDATION: Heparin 5000 units, TPA 2 mg, 100 mcg fentanyl Patient was monitored by a radiology nurse throughout the procedure. FLUOROSCOPY TIME:  Fluoroscopy Time: 18 minutes12 seconds (42 mGy). COMPLICATIONS: None immediate. PROCEDURE: The procedure was  explained to the patient. The risks and benefits of the procedure were discussed and the patient's questions were addressed. Informed consent was obtained from the patient. The left AV fistula was initially patent. Fistula was punctured with an Angiocath and a series of fistulogram images were obtained. Initially, we planned to perform only balloon angioplasty and possible stent placement. The left arm was prepped and draped in sterile fashion. Maximal barrier sterile technique was utilized including caps, mask, sterile gowns, sterile gloves, sterile drape, hand hygiene and skin antiseptic. The skin around the catheter was anesthetized with 1% lidocaine. The Angiocath was exchanged for a 6 [place] vascular sheath was some difficulty. Eventually, 6 French sheath was advanced over a stiff Amplatz wire. Following placement of the 6 French vascular sheath, there was thrombosis of the outflow vein from the elbow to the upper humerus region. At this point, the procedure was converted to a declot procedure. A suitable area for a second venous access was identified in the upper arm using ultrasound. Skin was anesthetized with 1% lidocaine. A 21 gauge needle was directed into the cephalic vein with ultrasound guidance and a micropuncture dilator set was placed and directed towards the arterial anastomosis. Patient was given IV heparin. 2 mg of tPA was injected along the occluded outflow vein in the left upper arm. The outflow vein was treated with the Angiojet thrombectomy device. The areas of known stenosis were angioplastied with a 6 mm x 40 mm Mustang balloon. The micropuncture dilator pointing towards the anastomosis was upsized to a 6 [place] vascular sheath. Eventually, a 5 [place] Kumpe the catheter was advanced across the arterial anastomosis with a Glidewire. It was very difficult to advance a wire beyond the clot. Subsequently, the arterial plug was pulled using an over-the-wire Fogarty balloon. After the arterial  plug was removed, there was flow throughout the fistula. Follow-up fistulogram images demonstrated recurrent areas of venous stenosis which were again treated with the 6 mm angioplasty balloon. Additional angiogram images demonstrated stenosis the vein just beyond the anastomosis. Therefore, a 5 mm x 20 mm Mustang balloon was used  to angioplasty this segment. Follow-up angiogram images were obtained. Follow up angiogram images again demonstrated recurrent stenosis in the upper arm cephalic vein. This area was treated again with the 6 mm balloon with minimal improvement. Therefore, this area was treated with an 8 mm x 40 mm self expanding Smart Flex stent. Stent was successfully placed across the stenosis and dilated with a 8 mm x 40 mm balloon. Follow-up angiogram images were obtained. Both vascular sheaths were removed with pursestring sutures. FINDINGS: Fistulogram images demonstrated multiple areas of stenosis involving the left upper arm cephalic vein. There was a moderate-to-severe stenosis in the mid humeral region. At least 2 other areas of stenosis in the lower humeral region. One of the lower humeral stenoses was near the anastomosis. Following placement of the vascular sheath, there was thrombosis of the outflow vein. After flow was re-established within the fistula and outflow vein, there was recurrent stenosis in the mid humerus despite multiple angioplasties. This area of recurrent stenosis may have been the cause of the initial problems and thrombosis. Therefore, this area was stented. Post stent angiogram demonstrated that the stented segment was patent. Mild narrowing in the midportion of stent. Areas of narrowing proximal to the stent are compatible with vasospasm. IMPRESSION: Successful declot of the left upper arm AV fistula. Persistent area of stenosis in the left cephalic vein was treated with a stent. Electronically Signed   By: Markus Daft M.D.   On: 11/30/2016 18:14   Ir Thrombectomy Av  Fistula W/thrombolysis/pta/stent Inc Shunt Img Lt  Result Date: 11/30/2016 INDICATION: 61 year old with end-stage renal disease. Patient has a left upper arm brachiocephalic fistula with poor flows and pulling clots. EXAM: AV FISTULA DECLOT WITH THROMBOLYSIS, MECHANICAL THROMBECTOMY, BALLOON ANGIOPLASTY AND STENT PLACEMENT ULTRASOUND GUIDANCE FOR VASCULAR ACCESS Physician: Stephan Minister. Anselm Pancoast, MD MEDICATIONS: None. ANESTHESIA/SEDATION: Heparin 5000 units, TPA 2 mg, 100 mcg fentanyl Patient was monitored by a radiology nurse throughout the procedure. FLUOROSCOPY TIME:  Fluoroscopy Time: 18 minutes12 seconds (42 mGy). COMPLICATIONS: None immediate. PROCEDURE: The procedure was explained to the patient. The risks and benefits of the procedure were discussed and the patient's questions were addressed. Informed consent was obtained from the patient. The left AV fistula was initially patent. Fistula was punctured with an Angiocath and a series of fistulogram images were obtained. Initially, we planned to perform only balloon angioplasty and possible stent placement. The left arm was prepped and draped in sterile fashion. Maximal barrier sterile technique was utilized including caps, mask, sterile gowns, sterile gloves, sterile drape, hand hygiene and skin antiseptic. The skin around the catheter was anesthetized with 1% lidocaine. The Angiocath was exchanged for a 6 [place] vascular sheath was some difficulty. Eventually, 6 French sheath was advanced over a stiff Amplatz wire. Following placement of the 6 French vascular sheath, there was thrombosis of the outflow vein from the elbow to the upper humerus region. At this point, the procedure was converted to a declot procedure. A suitable area for a second venous access was identified in the upper arm using ultrasound. Skin was anesthetized with 1% lidocaine. A 21 gauge needle was directed into the cephalic vein with ultrasound guidance and a micropuncture dilator set was placed  and directed towards the arterial anastomosis. Patient was given IV heparin. 2 mg of tPA was injected along the occluded outflow vein in the left upper arm. The outflow vein was treated with the Angiojet thrombectomy device. The areas of known stenosis were angioplastied with a 6 mm x 40 mm  Mustang balloon. The micropuncture dilator pointing towards the anastomosis was upsized to a 6 [place] vascular sheath. Eventually, a 5 [place] Kumpe the catheter was advanced across the arterial anastomosis with a Glidewire. It was very difficult to advance a wire beyond the clot. Subsequently, the arterial plug was pulled using an over-the-wire Fogarty balloon. After the arterial plug was removed, there was flow throughout the fistula. Follow-up fistulogram images demonstrated recurrent areas of venous stenosis which were again treated with the 6 mm angioplasty balloon. Additional angiogram images demonstrated stenosis the vein just beyond the anastomosis. Therefore, a 5 mm x 20 mm Mustang balloon was used to angioplasty this segment. Follow-up angiogram images were obtained. Follow up angiogram images again demonstrated recurrent stenosis in the upper arm cephalic vein. This area was treated again with the 6 mm balloon with minimal improvement. Therefore, this area was treated with an 8 mm x 40 mm self expanding Smart Flex stent. Stent was successfully placed across the stenosis and dilated with a 8 mm x 40 mm balloon. Follow-up angiogram images were obtained. Both vascular sheaths were removed with pursestring sutures. FINDINGS: Fistulogram images demonstrated multiple areas of stenosis involving the left upper arm cephalic vein. There was a moderate-to-severe stenosis in the mid humeral region. At least 2 other areas of stenosis in the lower humeral region. One of the lower humeral stenoses was near the anastomosis. Following placement of the vascular sheath, there was thrombosis of the outflow vein. After flow was  re-established within the fistula and outflow vein, there was recurrent stenosis in the mid humerus despite multiple angioplasties. This area of recurrent stenosis may have been the cause of the initial problems and thrombosis. Therefore, this area was stented. Post stent angiogram demonstrated that the stented segment was patent. Mild narrowing in the midportion of stent. Areas of narrowing proximal to the stent are compatible with vasospasm. IMPRESSION: Successful declot of the left upper arm AV fistula. Persistent area of stenosis in the left cephalic vein was treated with a stent. Electronically Signed   By: Markus Daft M.D.   On: 11/30/2016 18:14        Medical Problem List and Plan: 1.  Functional and mobility deficits  secondary to seizure and debility related to multiple medical issues             -admit patient to inpatient rehab today 2.  DVT Prophylaxis/Anticoagulation: Pharmaceutical: Heparin 3. Pain Management: N/a 4. Mood: LCSW to follow for evaluation and support.  5. Neuropsych: This patient is not fully capable of making decisions on his own behalf. 6. Skin/Wound Care: Maintain adequate nutrition and hydration status. Routine pressure relief measures.  7. Fluids/Electrolytes/Nutrition: Monitor I/O. Now on renal diet with 1200 cc FR.              -lab work with HD 8. 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  No driving till follow up EEG /evaluation by neurology on outpatient basis.  9. ESRD: Has has been switched to HD on TTS pending neurologic recovery. Hope to transition to PD eventually in outpatient setting--patient upset and insistent that he will not be doing HD at discharge.  RD to educate on renal diet.             -HD to be scheduled after therapy day to accommodate rehab schedule 10 T2DM: Takes NPH 20 units bid with 25 units bid.  BS continue to be poorly controlled will monitor BS  4-5 X day  and adjust as needed. Has not seen  primary MD X 3 years.  11. HTN: Monitor BP bid--on amlodipine, hydralazine, metoprolol and losartan. Medications continue to be adjusted for tighter control.  12. Anemia of chronic disease: H/H monitored with HD.  On aranesp for supplement. Olene Floss Admission Physician Evaluation: 1. Functional deficits secondary  to seizure and debility related to multiple medical. 2. Patient is admitted to receive collaborative, interdisciplinary care between the physiatrist, rehab nursing staff, and therapy team. 3. Patient's level of medical complexity and substantial therapy needs in context of that medical necessity cannot be provided at a lesser intensity of care such as a SNF. 4. Patient has experienced substantial functional loss from his/her baseline which was documented above under the "Functional History" and "Functional Status" headings.  Judging by the patient's diagnosis, physical exam, and functional history, the patient has potential for functional progress which will result in measurable gains while on inpatient rehab.  These gains will be of substantial and practical use upon discharge  in facilitating mobility and self-care at the household level. 5. Physiatrist will provide 24 hour management of medical needs as well as oversight of the therapy plan/treatment and provide guidance as appropriate regarding the interaction of the two. 6. The Preadmission Screening has been reviewed and patient status is unchanged unless otherwise stated above. 7. 24 hour rehab nursing will assist with bladder management, bowel management, safety, skin/wound care, disease management, medication administration, pain management and patient education  and help integrate therapy concepts, techniques,education, etc. 17. PT will assess and treat for/with: Lower extremity strength, range of motion, stamina, balance, functional mobility, safety, adaptive techniques and equipment, NMR, behavioral mgt, community  reintegration, ego support   Goals are: mod I. 8. OT will assess and treat for/with: ADL's, functional mobility, safety, upper extremity strength, adaptive techniques and equipment, NMR, behavioral mgt, ego support, community reintegration.   Goals are: mod I. Therapy may proceed with showering this patient. 9. SLP will assess and treat for/with: cognition, communication.  Goals are: mod I. 10. Case Management and Social Worker will assess and treat for psychological issues and discharge planning. 11. Team conference will be held weekly to assess progress toward goals and to determine barriers to discharge. 12. Patient will receive at least 3 hours of therapy per day at least 5 days per week. 13. ELOS: 9-13 days       14. Prognosis:  excellent     [name], MD, [facility] 12/01/2016  Bary Leriche, Hershal Coria 12/01/2016

## 2016-12-01 NOTE — Progress Notes (Signed)
Occupational Therapy Treatment Patient Details Name: Jared [name] MRN: [id number] DOB: [birth year] Today's Date: 12/01/2016    History of present illness Patient is a 61 y/o male with hx of ESRD on peritoneal dialysis, CKD, IDDM, HTN presents with AMS and concern for acute stroke with witnessed seizures in the ED. Intubated 12/29-1/1. Glucose >600.  Markedly hypertensive with BP 220s/110s. CT, MRI and CTA head negative.  Question profound hyperglycemia vs hypertensive encephalopathy precipitated his seizure   OT comments  Pt able to perform functional mobility with use of RW and min assist; continues to demonstrate bil LE weakness, knee buckling, forward flexed posture, and posterior lean when fatigued. Pt requires min assist for grooming activities standing at the sink, min guard for donning socks at EOB. D/c plan remains appropriate. Will continue to follow acutely.   Follow Up Recommendations  CIR    Equipment Recommendations  Other (comment) (TBD)    Recommendations for Other Services      Precautions / Restrictions Precautions Precautions: Fall Precaution Comments: peritoneal access abdomen Restrictions Weight Bearing Restrictions: No       Mobility Bed Mobility Overal bed mobility: Needs Assistance Bed Mobility: Supine to Sit     Supine to sit: Min guard     General bed mobility comments: HOB flat, increased time required.  Transfers Overall transfer level: Needs assistance Equipment used: Rolling walker (2 wheeled) Transfers: Sit to/from Stand Sit to Stand: Min assist         General transfer comment: Min assist for balance with sit to stand from EOB x1. Cues for hand placement. Increased time required.    Balance Overall balance assessment: Needs assistance Sitting-balance support: Feet supported;No upper extremity supported Sitting balance-Leahy Scale: Good     Standing balance support: No upper extremity supported;During functional activity Standing  balance-Leahy Scale: Poor Standing balance comment: Min assist for balance during grooming tasks at sink without UE support                   ADL Overall ADL's : Needs assistance/impaired     Grooming: Minimal assistance;Standing;Wash/dry face;Oral care Grooming Details (indicate cue type and reason): Min assist for standing balance. Pt with bil knee buckling, forward flexed posture, and posterior lean when fatigues.             Lower Body Dressing: Min guard Lower Body Dressing Details (indicate cue type and reason): To don socks in sitting Toilet Transfer: Minimal assistance;Ambulation;Regular Toilet;RW Toilet Transfer Details (indicate cue type and reason): Simulated by sit to stand from EOB with functional mobility.         Functional mobility during ADLs: Minimal assistance;Rolling walker General ADL Comments: Continues to demonstrate bil LE weakness and knee buckling. Fatigues quickly with functional activities.      Vision                     Perception     Praxis      Cognition   Behavior During Therapy: [facility] for tasks assessed/performed Overall Cognitive Status: Within Functional Limits for tasks assessed                       Extremity/Trunk Assessment               Exercises     Shoulder Instructions       General Comments      Pertinent Vitals/ Pain       Pain Assessment: No/denies pain  Home Living                                          Prior Functioning/Environment              Frequency  Min 2X/week        Progress Toward Goals  OT Goals(current goals can now be found in the care plan section)  Progress towards OT goals: Progressing toward goals  Acute Rehab OT Goals Patient Stated Goal: get better and stronger OT Goal Formulation: With patient ADL Goals Pt Will Perform Grooming: with supervision;standing Pt Will Perform Upper Body Bathing: with set-up;sitting Pt Will  Perform Lower Body Bathing: with supervision;sit to/from stand Pt Will Perform Upper Body Dressing: with set-up;sitting Pt Will Perform Lower Body Dressing: with supervision;sit to/from stand Pt Will Transfer to Toilet: with supervision;ambulating;regular height toilet Pt Will Perform Toileting - Clothing Manipulation and hygiene: with supervision;sit to/from stand  Plan Discharge plan remains appropriate    Co-evaluation    PT/OT/SLP Co-Evaluation/Treatment: Yes Reason for Co-Treatment: For patient/therapist safety;To address functional/ADL transfers   OT goals addressed during session: ADL's and self-care      End of Session Equipment Utilized During Treatment: Gait belt;Rolling walker   Activity Tolerance Patient tolerated treatment well   Patient Left in chair;with call bell/phone within reach;with chair alarm set   Nurse Communication Mobility status        Time: 3159-4585 OT Time Calculation (min): 28 min  Charges: OT General Charges $OT Visit: 1 Procedure OT Treatments $Self Care/Home Management : 8-22 mins  Binnie Kand M.S., OTR/L Pager: [phone]  12/01/2016, 10:37 AM

## 2016-12-01 NOTE — Progress Notes (Signed)
Patient and family were informed about rehab process including patient safety plan and rehab booklet. 

## 2016-12-01 NOTE — Progress Notes (Addendum)
Inpatient Rehabilitation  I have spoken with patient's sister, [name] who discussed rehab with family and they are in agreement with plan.  She reports being hopeful that patient can resume most of his independence; however, has discussed with patient's son that he may need to help out if his father requires assist at discharge.  I await medical clearance prior to potential admission today.  Please call with questions. ]  Update: I have received verbal medical clearance to admit.   Carmelia Roller., CCC/SLP Admission Coordinator  [place]  Cell [phone]

## 2016-12-01 NOTE — H&P (Signed)
Physical Medicine and Rehabilitation Admission H&P    Chief Complaint  Patient presents with  . Encephalopathy due to seizure    HPI:  Jared [name] is a 61 y.o. 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 He was intubated for airway protection and was loaded with keppra.  CTA head/neck negative for large vessel occlusion. MRI brain negative for acute changes. Seizures felt to be due to hyperglycemia and likely contributing to RUE weakness. Dr. Leonel Ramsay recommends continuing [place] for total of 7 days.  Dose decreased due to sedation and delirium resolving. BP control improving with addition of Losartan and off insulin drip. Transitioned to HD and requried L-AV fistula thrombolysis by radiology on 01/4. Therapy evaluations done revealing generalized weakness with delayed processing and muffled speech. PM&R was consulted and felt that he could benefit from an inpatient rehab admission. Pt is being admitted today    Review of Systems  HENT: Negative for hearing loss.   Eyes: Negative for blurred vision and double vision.  Respiratory: Negative for cough and shortness of breath.   Cardiovascular: Negative for chest pain.  Gastrointestinal: Negative for heartburn and nausea.  Musculoskeletal: Negative for myalgias.  Skin: Negative for itching and rash.  Neurological: Positive for weakness.      Past Medical History:  Diagnosis Date  . Anemia   . Chronic kidney disease    on dialysis M-W-F  . Diabetes mellitus without complication ([place])    onset as adult  . Hypertension   . Thyroid disease    hyperparathyroidism    Past Surgical History:  Procedure Laterality Date  . AV FISTULA PLACEMENT Left 06/04/2014   Procedure: ARTERIOVENOUS (AV) FISTULA CREATION-  BRACHIOCEPHALIC WITH LIGATION OF COMPETEING BRANCH;  Surgeon: Mal Misty, MD;  Location: [facility];   Service: Vascular;  Laterality: Left;  . INSERTION OF DIALYSIS CATHETER  04/2014  . IR GENERIC HISTORICAL Left 11/30/2016   IR THROMBECTOMY AV FISTULA W/THROMBOLYSIS/PTA/STENT INC/SHUNT/IMG LT 11/30/2016 Markus Daft, MD [facility]  . IR GENERIC HISTORICAL  11/30/2016   IR US GUIDE VASC ACCESS LEFT 11/30/2016 Markus Daft, MD [facility]    Family History  Problem Relation Age of Onset  . Cancer - Lung Mother   . Cancer - Other Father     Social History:  Retired. Lives with sister--retired and in good health. Works part time for [employer]. He reports that he quit smoking about 32 years ago. He has never used smokeless tobacco. He reports that he drinks beer occasionally.  He reports that he does not use drugs.     Allergies  Allergen Reactions  . Penicillins Other (See Comments)    Has patient had a PCN reaction causing immediate rash, facial/tongue/throat swelling, SOB or lightheadedness with hypotension: Unk Has patient had a PCN reaction causing severe rash involving mucus membranes or skin necrosis: Unk Has patient had a PCN reaction that required hospitalization: Unk Has patient had a PCN reaction occurring within the last 10 years: Unk If all of the above answers are "NO", then may proceed with Cephalosporin use.     Medications Prior to Admission  Medication Sig Dispense Refill  . amLODipine (NORVASC) 10 MG tablet Take 10 mg by mouth daily.    Marland Kitchen b complex-vitamin c-folic acid (NEPHRO-VITE) 0.8 MG TABS tablet Take 1 tablet by mouth at bedtime.    Marland Kitchen  calcitRIOL (ROCALTROL) 0.5 MCG capsule Take 0.5 mcg by mouth daily.    . cinacalcet (SENSIPAR) 30 MG tablet Take 30 mg by mouth daily.    . hydrALAZINE (APRESOLINE) 100 MG tablet Take 100 mg by mouth 3 (three) times daily.    . Insulin NPH Human, Isophane, (NOVOLIN N [place]) Inject 4-19 Units into the skin 2 (two) times daily.     . insulin regular (NOVOLIN R,HUMULIN R) 100 units/mL injection Inject 2-6 Units into the skin  2 (two) times daily before a meal.     . metoprolol (LOPRESSOR) 100 MG tablet Take 100 mg by mouth 2 (two) times daily.     Marland Kitchen oxyCODONE (ROXICODONE) 5 MG immediate release tablet Take 1 tablet (5 mg total) by mouth every 6 (six) hours as needed for severe pain. 10 tablet 0  . sevelamer carbonate (RENVELA) 800 MG tablet Take 800 mg by mouth 3 (three) times daily with meals.      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   Functional Status:  Mobility: Bed Mobility Overal bed mobility: Needs Assistance Bed Mobility: Supine to Sit Supine to sit: Min guard General bed mobility comments: HOB flat, increased time required. Transfers Overall transfer level: Needs assistance Equipment used: Rolling walker (2 wheeled) Transfers: Sit to/from Stand Sit to Stand: Min assist Stand pivot transfers: Max assist General transfer comment: Min assist for balance with sit to stand from EOB x1. Cues for hand placement. Increased time required.      ADL: ADL Overall ADL's : Needs assistance/impaired Grooming: Minimal assistance, Standing, Wash/dry face, Oral care Grooming Details (indicate cue type and reason): Min assist for standing balance. Pt with bil knee buckling, forward flexed posture, and posterior lean when fatigues. Upper Body Bathing: Maximal assistance Lower Body Bathing: Total  assistance Upper Body Dressing : Maximal assistance Lower Body Dressing: Min guard Lower Body Dressing Details (indicate cue type and reason): To don socks in sitting Toilet Transfer: Minimal assistance, Ambulation, Regular Toilet, RW Toilet Transfer Details (indicate cue type and reason): Simulated by sit to stand from EOB with functional mobility. Functional mobility during ADLs: Minimal assistance, Rolling walker General ADL Comments: Continues to demonstrate bil LE weakness and knee buckling. Fatigues quickly with functional activities.  Cognition: Cognition Overall Cognitive Status: Within Functional Limits for tasks assessed Orientation Level: Oriented X4 Cognition Arousal/Alertness: Awake/alert Behavior During Therapy: WFL for tasks assessed/performed Overall Cognitive Status: Within Functional Limits for tasks assessed Area of Impairment: Problem solving, Attention Current Attention Level: Sustained Problem Solving: Slow processing, Decreased initiation, Requires verbal cues, Requires tactile cues General Comments: A&Ox4; difficult to understand at times due to muffled speech. Difficult to assess due to: Level of arousal   Blood pressure (!) 182/87, pulse 96, temperature 99.8 F (37.7 C), temperature source Oral, resp. rate 18, height '5\' 10"'$  (1.778 m), weight 75.8 kg (167 lb 1.7 oz), SpO2 93 %. Physical Exam  Nursing note and vitals reviewed. Constitutional: He is oriented to person, place, and time. He appears well-developed and well-nourished.  HENT:  Head:  Normocephalic and atraumatic.  Right Ear: External ear normal.  Left Ear: External ear normal.  Edentulous.   Eyes: Conjunctivae and EOM are normal. Pupils are equal, round, and reactive to light.  Neck: Normal range of motion. Neck supple. No tracheal deviation present. No thyromegaly present.  Cardiovascular: Normal rate and regular rhythm.  Exam reveals no gallop and no friction rub.   No murmur  heard. Respiratory: Effort normal. No stridor. No respiratory distress. He has no wheezes.  GI: Soft. Bowel sounds are normal. He exhibits no distension. There is no tenderness.  Musculoskeletal: He exhibits edema (at LUE graft site--dry dressing in place. ).  Neurological: He is alert and oriented to person, place, and time.  Slurred, soft slow speech. Fairly alert today. Reasonable insight and awareness.  UE strength 4/5 deltoid, bicep, tricep, wrist and fingers. LE strength 3/5 HF, 4/5 KE and ADF/PF. No gross sensory deficits noted today.   Skin: Skin is warm and dry.  Psychiatric: His speech is delayed and slurred. He is slowed and withdrawn. He expresses inappropriate judgment.  Flat but cooperative    Results for orders placed or performed during the hospital encounter of 11/24/16 (from the past 48 hour(s))  Glucose, capillary     Status: Abnormal   Collection Time: 11/29/16  5:48 PM  Result Value Ref Range   Glucose-Capillary 144 (H) 65 - 99 mg/dL  Glucose, capillary     Status: Abnormal   Collection Time: 11/29/16  9:12 PM  Result Value Ref Range   Glucose-Capillary 155 (H) 65 - 99 mg/dL  Basic metabolic panel     Status: Abnormal   Collection Time: 11/30/16  5:26 AM  Result Value Ref Range   Sodium 136 135 - 145 mmol/L   Potassium 4.4 3.5 - 5.1 mmol/L   Chloride 98 (L) 101 - 111 mmol/L   CO2 26 22 - 32 mmol/L   Glucose, Bld 171 (H) 65 - 99 mg/dL   BUN 48 (H) 6 - 20 mg/dL   Creatinine, Ser 1.90 (H) 0.61 - 1.24 mg/dL   Calcium 8.4 (L) 8.9 - 10.3 mg/dL   GFR calc non Af Amer 6 (L) >60 mL/min   GFR calc Af Amer 7 (L) >60 mL/min    Comment: (NOTE) The eGFR has been calculated using the CKD EPI equation. This calculation has not been validated in all clinical situations. eGFR's persistently <60 mL/min signify possible Chronic Kidney Disease.    Anion gap 12 5 - 15  CBC     Status: Abnormal   Collection Time: 11/30/16  5:26 AM  Result Value Ref Range   WBC 5.6 4.0 - 10.5  K/uL   RBC 3.71 (L) 4.22 - 5.81 MIL/uL   Hemoglobin 9.9 (L) 13.0 - 17.0 g/dL   HCT 68.1 (L) 78.9 - 54.4 %   MCV 82.7 78.0 - 100.0 fL   MCH 26.7 26.0 - 34.0 pg   MCHC 32.2 30.0 - 36.0 g/dL   RDW 48.5 (H) 53.3 - 64.7 %   Platelets 197 150 - 400 K/uL  Magnesium     Status: None   Collection Time: 11/30/16  5:26 AM  Result Value Ref Range   Magnesium 2.0 1.7 - 2.4 mg/dL  Phosphorus     Status: Abnormal   Collection Time: 11/30/16  5:26 AM  Result Value Ref Range   Phosphorus 4.7 (H) 2.5 - 4.6 mg/dL  Glucose, capillary     Status: Abnormal   Collection Time: 11/30/16 11:38 AM  Result Value Ref Range   Glucose-Capillary 158 (H) 65 - 99 mg/dL  Glucose, capillary     Status: Abnormal   Collection Time: 11/30/16  5:52 PM  Result Value Ref Range   Glucose-Capillary 173 (H) 65 - 99 mg/dL  Glucose, capillary     Status: Abnormal   Collection Time: 11/30/16  8:48 PM  Result Value Ref Range   Glucose-Capillary 166 (H) 65 - 99 mg/dL  Glucose, capillary     Status: None   Collection Time: 12/01/16  8:09 AM  Result Value Ref Range   Glucose-Capillary 83 65 - 99 mg/dL   Ir US Guide Vasc Access Left  Result Date: 11/30/2016 INDICATION: 61 year old with end-stage renal disease. Patient has a left upper arm brachiocephalic fistula with poor flows and pulling clots. EXAM: AV FISTULA DECLOT WITH THROMBOLYSIS, MECHANICAL THROMBECTOMY, BALLOON ANGIOPLASTY AND STENT PLACEMENT ULTRASOUND GUIDANCE FOR VASCULAR ACCESS Physician: Stephan Minister. Anselm Pancoast, MD MEDICATIONS: None. ANESTHESIA/SEDATION: Heparin 5000 units, TPA 2 mg, 100 mcg fentanyl Patient was monitored by a radiology nurse throughout the procedure. FLUOROSCOPY TIME:  Fluoroscopy Time: 18 minutes12 seconds (42 mGy). COMPLICATIONS: None immediate. PROCEDURE: The procedure was explained to the patient. The risks and benefits of the procedure were discussed and the patient's questions were addressed. Informed consent was obtained from the patient. The left AV  fistula was initially patent. Fistula was punctured with an Angiocath and a series of fistulogram images were obtained. Initially, we planned to perform only balloon angioplasty and possible stent placement. The left arm was prepped and draped in sterile fashion. Maximal barrier sterile technique was utilized including caps, mask, sterile gowns, sterile gloves, sterile drape, hand hygiene and skin antiseptic. The skin around the catheter was anesthetized with 1% lidocaine. The Angiocath was exchanged for a 6 [place] vascular sheath was some difficulty. Eventually, 6 French sheath was advanced over a stiff Amplatz wire. Following placement of the 6 French vascular sheath, there was thrombosis of the outflow vein from the elbow to the upper humerus region. At this point, the procedure was converted to a declot procedure. A suitable area for a second venous access was identified in the upper arm using ultrasound. Skin was anesthetized with 1% lidocaine. A 21 gauge needle was directed into the cephalic vein with ultrasound guidance and a micropuncture dilator set was placed and directed towards the arterial anastomosis. Patient was given IV heparin. 2 mg of tPA was injected along the occluded outflow vein in the left upper arm. The outflow vein was treated with the Angiojet thrombectomy device. The areas of known stenosis were angioplastied with a 6 mm x 40 mm Mustang balloon. The micropuncture dilator pointing towards the anastomosis was upsized to a 6 [place] vascular sheath. Eventually, a 5 [place] Kumpe the catheter was advanced across the arterial anastomosis with a Glidewire. It was very difficult to advance a wire beyond the clot. Subsequently, the arterial plug was pulled using an over-the-wire Fogarty balloon. After the arterial plug was removed, there was flow throughout the fistula. Follow-up fistulogram images demonstrated recurrent areas of venous stenosis which were again treated with the 6 mm angioplasty  balloon. Additional angiogram images demonstrated stenosis the vein just beyond the anastomosis. Therefore, a 5 mm x 20 mm Mustang balloon was used to angioplasty this segment. Follow-up angiogram images were obtained. Follow up angiogram images again demonstrated recurrent stenosis in the upper arm cephalic vein. This area was treated again with the 6 mm balloon with minimal improvement. Therefore, this area was treated  with an 8 mm x 40 mm self expanding Smart Flex stent. Stent was successfully placed across the stenosis and dilated with a 8 mm x 40 mm balloon. Follow-up angiogram images were obtained. Both vascular sheaths were removed with pursestring sutures. FINDINGS: Fistulogram images demonstrated multiple areas of stenosis involving the left upper arm cephalic vein. There was a moderate-to-severe stenosis in the mid humeral region. At least 2 other areas of stenosis in the lower humeral region. One of the lower humeral stenoses was near the anastomosis. Following placement of the vascular sheath, there was thrombosis of the outflow vein. After flow was re-established within the fistula and outflow vein, there was recurrent stenosis in the mid humerus despite multiple angioplasties. This area of recurrent stenosis may have been the cause of the initial problems and thrombosis. Therefore, this area was stented. Post stent angiogram demonstrated that the stented segment was patent. Mild narrowing in the midportion of stent. Areas of narrowing proximal to the stent are compatible with vasospasm. IMPRESSION: Successful declot of the left upper arm AV fistula. Persistent area of stenosis in the left cephalic vein was treated with a stent. Electronically Signed   By: Markus Daft M.D.   On: 11/30/2016 18:14   Ir Thrombectomy Av Fistula W/thrombolysis/pta/stent Inc Shunt Img Lt  Result Date: 11/30/2016 INDICATION: 61 year old with end-stage renal disease. Patient has a left upper arm brachiocephalic fistula with  poor flows and pulling clots. EXAM: AV FISTULA DECLOT WITH THROMBOLYSIS, MECHANICAL THROMBECTOMY, BALLOON ANGIOPLASTY AND STENT PLACEMENT ULTRASOUND GUIDANCE FOR VASCULAR ACCESS Physician: Stephan Minister. Anselm Pancoast, MD MEDICATIONS: None. ANESTHESIA/SEDATION: Heparin 5000 units, TPA 2 mg, 100 mcg fentanyl Patient was monitored by a radiology nurse throughout the procedure. FLUOROSCOPY TIME:  Fluoroscopy Time: 18 minutes12 seconds (42 mGy). COMPLICATIONS: None immediate. PROCEDURE: The procedure was explained to the patient. The risks and benefits of the procedure were discussed and the patient's questions were addressed. Informed consent was obtained from the patient. The left AV fistula was initially patent. Fistula was punctured with an Angiocath and a series of fistulogram images were obtained. Initially, we planned to perform only balloon angioplasty and possible stent placement. The left arm was prepped and draped in sterile fashion. Maximal barrier sterile technique was utilized including caps, mask, sterile gowns, sterile gloves, sterile drape, hand hygiene and skin antiseptic. The skin around the catheter was anesthetized with 1% lidocaine. The Angiocath was exchanged for a 6 [place] vascular sheath was some difficulty. Eventually, 6 French sheath was advanced over a stiff Amplatz wire. Following placement of the 6 French vascular sheath, there was thrombosis of the outflow vein from the elbow to the upper humerus region. At this point, the procedure was converted to a declot procedure. A suitable area for a second venous access was identified in the upper arm using ultrasound. Skin was anesthetized with 1% lidocaine. A 21 gauge needle was directed into the cephalic vein with ultrasound guidance and a micropuncture dilator set was placed and directed towards the arterial anastomosis. Patient was given IV heparin. 2 mg of tPA was injected along the occluded outflow vein in the left upper arm. The outflow vein was treated  with the Angiojet thrombectomy device. The areas of known stenosis were angioplastied with a 6 mm x 40 mm Mustang balloon. The micropuncture dilator pointing towards the anastomosis was upsized to a 6 [place] vascular sheath. Eventually, a 5 [place] Kumpe the catheter was advanced across the arterial anastomosis with a Glidewire. It was very difficult to advance a wire  beyond the clot. Subsequently, the arterial plug was pulled using an over-the-wire Fogarty balloon. After the arterial plug was removed, there was flow throughout the fistula. Follow-up fistulogram images demonstrated recurrent areas of venous stenosis which were again treated with the 6 mm angioplasty balloon. Additional angiogram images demonstrated stenosis the vein just beyond the anastomosis. Therefore, a 5 mm x 20 mm Mustang balloon was used to angioplasty this segment. Follow-up angiogram images were obtained. Follow up angiogram images again demonstrated recurrent stenosis in the upper arm cephalic vein. This area was treated again with the 6 mm balloon with minimal improvement. Therefore, this area was treated with an 8 mm x 40 mm self expanding Smart Flex stent. Stent was successfully placed across the stenosis and dilated with a 8 mm x 40 mm balloon. Follow-up angiogram images were obtained. Both vascular sheaths were removed with pursestring sutures. FINDINGS: Fistulogram images demonstrated multiple areas of stenosis involving the left upper arm cephalic vein. There was a moderate-to-severe stenosis in the mid humeral region. At least 2 other areas of stenosis in the lower humeral region. One of the lower humeral stenoses was near the anastomosis. Following placement of the vascular sheath, there was thrombosis of the outflow vein. After flow was re-established within the fistula and outflow vein, there was recurrent stenosis in the mid humerus despite multiple angioplasties. This area of recurrent stenosis may have been the cause of the  initial problems and thrombosis. Therefore, this area was stented. Post stent angiogram demonstrated that the stented segment was patent. Mild narrowing in the midportion of stent. Areas of narrowing proximal to the stent are compatible with vasospasm. IMPRESSION: Successful declot of the left upper arm AV fistula. Persistent area of stenosis in the left cephalic vein was treated with a stent. Electronically Signed   By: Markus Daft M.D.   On: 11/30/2016 18:14       Medical Problem List and Plan: 1.  Functional and mobility deficits  secondary to seizure and debility related to multiple medical issues  -admit patient to inpatient rehab today 2.  DVT Prophylaxis/Anticoagulation: Pharmaceutical: Heparin 3. Pain Management: N/a 4. Mood: LCSW to follow for evaluation and support.  5. Neuropsych: This patient is not fully capable of making decisions on his own behalf. 6. Skin/Wound Care: Maintain adequate nutrition and hydration status. Routine pressure relief measures.  7. Fluids/Electrolytes/Nutrition: Monitor I/O. Now on renal diet with 1200 cc FR.   -lab work with HD 8. 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  No driving till follow up EEG /evaluation by neurology on outpatient basis.  9. ESRD: Has has been switched to HD on TTS pending neurologic recovery. Hope to transition to PD eventually in outpatient setting--patient upset and insistent that he will not be doing HD at discharge.  RD to educate on renal diet.  -HD to be scheduled after therapy day to accommodate rehab schedule 10 T2DM: Takes NPH 20 units bid with 25 units bid.  BS continue to be poorly controlled will monitor BS  4-5 X day and adjust as needed. Has not seen primary MD X 3 years.  11. HTN: Monitor BP bid--on amlodipine, hydralazine, metoprolol and losartan. Medications continue to be adjusted for tighter control.  12. Anemia of chronic disease: H/H monitored with HD.  On aranesp for  supplement. Olene Floss Admission Physician Evaluation: 1. Functional deficits secondary  to seizure and debility related to multiple medical. 2. Patient is admitted to receive  collaborative, interdisciplinary care between the physiatrist, rehab nursing staff, and therapy team. 3. Patient's level of medical complexity and substantial therapy needs in context of that medical necessity cannot be provided at a lesser intensity of care such as a SNF. 4. Patient has experienced substantial functional loss from his/her baseline which was documented above under the "Functional History" and "Functional Status" headings.  Judging by the patient's diagnosis, physical exam, and functional history, the patient has potential for functional progress which will result in measurable gains while on inpatient rehab.  These gains will be of substantial and practical use upon discharge  in facilitating mobility and self-care at the household level. 5. Physiatrist will provide 24 hour management of medical needs as well as oversight of the therapy plan/treatment and provide guidance as appropriate regarding the interaction of the two. 6. The Preadmission Screening has been reviewed and patient status is unchanged unless otherwise stated above. 7. 24 hour rehab nursing will assist with bladder management, bowel management, safety, skin/wound care, disease management, medication administration, pain management and patient education  and help integrate therapy concepts, techniques,education, etc. PT will assess and treat for/with: Lower extremity strength, range of motion, stamina, balance, functional mobility, safety, adaptive techniques and equipment, NMR, behavioral mgt, community reintegration, ego support   Goals are: mod I. 8. OT will assess and treat for/with: ADL's, functional mobility, safety, upper extremity strength, adaptive techniques and equipment, NMR, behavioral mgt, ego support, community reintegration.   Goals  are: mod I. Therapy may proceed with showering this patient. 9. SLP will assess and treat for/with: cognition, communication.  Goals are: mod I. 10. Case Management and Social Worker will assess and treat for psychological issues and discharge planning. 11. Team conference will be held weekly to assess progress toward goals and to determine barriers to discharge. 12. Patient will receive at least 3 hours of therapy per day at least 5 days per week. 13. ELOS: 9-13 days       14. Prognosis:  excellent     [name], MD, [facility] 12/01/2016  Bary Leriche, Hershal Coria 12/01/2016

## 2016-12-01 NOTE — Progress Notes (Signed)
[name] Rehab Admission Coordinator Signed Physical Medicine and Rehabilitation  PMR Pre-admission Date of Service: 12/01/2016 11:07 AM  Related encounter: ED to Hosp-Admission (Current) from 11/24/2016 in [facility]       [] Hide copied text PMR Admission Coordinator Pre-Admission Assessment  Patient: Jared [name] is an 61 y.o., male MRN: [id number] DOB: [birth year] Height: 5\' 10"  (177.8 cm) Weight: 75.8 kg (167 lb 1.7 oz)                                                                                                                                                  Insurance Information HMO:     PPO:      PCP:      IPA:      80/20:      OTHER:  PRIMARY: Medicare A & B      Policy#: [id number]      Subscriber: Self CM Name:       Phone#:      Fax#:  Pre-Cert#: eligible per [employer]      Employer: disabled Benefits:  Phone #:      Name:  Eff. Date: 06/27/14     Deduct: $1340      Out of Pocket Max: none      Life Max: none CIR: 100%      SNF: 100% days 1-20; 80% days 21-100 Outpatient: 80%     Co-Pay: 20% Home Health: 100%      Co-Pay: none DME: 80%     Co-Pay: 20% Providers: patient's choice   Medicaid Application Date:       Case Manager:  Disability Application Date:       Case Worker:   Emergency Contact Information        Contact Information    Name Relation Home Work Mobile   [place] Sister [phone]     Mohan, Erven The Third Son   [phone]   Clifton,Linda Sister   [phone]     Current Medical History  Patient Admitting Diagnosis: Debility and encephalopathy after seizure and other medical complications  History of Present Illness: [name] a 61 y.o.malewith history of 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 He was intubated for airway protection and was loaded with keppra. CTA  head/neck negative for large vessel occlusion. MRI brain negative for acute changes. Seizures felt to be due to hyperglycemia and likely contributing to RUE weakness. Dr. Leonel Ramsay recommends continuing [place]. Dose decreased due to sedation and delirium resolving. BP control improving with addition of Losartan and off insulin drip. Transitioned to HD and requried L-AV fistula thrombolysis by radiology on 01/4. Therapy evaluations done revealing generalized weakness with delayed processing and muffled speech. Patient admitted to IP Rehab 11/30/16.  Past Medical History      Past Medical History:  Diagnosis Date  . Anemia   . Chronic kidney disease    on dialysis M-W-F  . Diabetes mellitus without complication ([place])    onset as adult  . Hypertension   . Thyroid disease    hyperparathyroidism    Family History  family history includes Cancer - Lung in his mother; Cancer - Other in his father.  Prior Rehab/Hospitalizations:  Has the patient had major surgery during 100 days prior to admission? No  Current Medications   Current Facility-Administered Medications:  .  0.9 %  sodium chloride infusion, 250 mL, Intravenous, PRN, Dannielle Burn, MD .  amLODipine (NORVASC) tablet 10 mg, 10 mg, Oral, Daily, Erick Colace, NP, 10 mg at 12/01/16 0948 .  calcitRIOL (ROCALTROL) capsule 0.5 mcg, 0.5 mcg, Oral, Daily, Erick Colace, NP, 0.5 mcg at 12/01/16 0948 .  calcium carbonate (TUMS - dosed in mg elemental calcium) chewable tablet 400 mg of elemental calcium, 400 mg of elemental calcium, Oral, TID WC, Jamal Maes, MD, 400 mg of elemental calcium at 12/01/16 0829 .  Darbepoetin Alfa (ARANESP) injection 60 mcg, 60 mcg, Intravenous, Q Tue-HD, Jamal Maes, MD, 60 mcg at 11/28/16 1846 .  feeding supplement (NEPRO CARB STEADY) liquid 237 mL, 237 mL, Oral, BID BM, Jamal Maes, MD, 237 mL at 12/01/16 0948 .  gentamicin cream (GARAMYCIN) 0.1 % 1 application, 1  application, Topical, Daily, Roney Jaffe, MD, 1 application at 60/73/71 0949 .  heparin injection 5,000 Units, 5,000 Units, Subcutaneous, Q8H, Dannielle Burn, MD, 5,000 Units at 12/01/16 0555 .  hydrALAZINE (APRESOLINE) tablet 100 mg, 100 mg, Oral, TID, Erick Colace, NP, 100 mg at 12/01/16 0948 .  insulin aspart (novoLOG) injection 0-20 Units, 0-20 Units, Subcutaneous, TID WC, Erick Colace, NP, 4 Units at 11/30/16 1804 .  insulin aspart (novoLOG) injection 0-5 Units, 0-5 Units, Subcutaneous, QHS, Erick Colace, NP .  insulin NPH Human (HUMULIN N,NOVOLIN N) injection 10 Units, 10 Units, Subcutaneous, BID AC & HS, Erick Colace, NP, 10 Units at 12/01/16 [phone] .  labetalol (NORMODYNE,TRANDATE) injection 20 mg, 20 mg, Intravenous, Q10 min PRN, Anders Simmonds, MD, 20 mg at 11/29/16 0731 .  lidocaine (PF) (XYLOCAINE) 1 % injection, , , PRN, Markus Daft, MD, 10 mL at 11/30/16 1511 .  losartan (COZAAR) tablet 50 mg, 50 mg, Oral, BID, Jamal Maes, MD, 50 mg at 12/01/16 0948 .  MEDLINE mouth rinse, 15 mL, Mouth Rinse, BID, Collene Gobble, MD, 15 mL at 11/30/16 2223 .  metoprolol (LOPRESSOR) tablet 100 mg, 100 mg, Oral, BID, Jamal Maes, MD, 100 mg at 12/01/16 0948 .  multivitamin (RENA-VIT) tablet 1 tablet, 1 tablet, Oral, QHS, Erick Colace, NP, 1 tablet at 11/30/16 2222 .  sevelamer carbonate (RENVELA) tablet 800 mg, 800 mg, Oral, TID WC, Erick Colace, NP, 800 mg at 12/01/16 9485  Patients Current Diet: DIET SOFT Room service appropriate? Yes; Fluid consistency: Thin; Fluid restriction: Other (see comments)  Precautions / Restrictions Precautions Precautions: Fall Precaution Comments: peritoneal access abdomen Restrictions Weight Bearing Restrictions: No   Has the patient had 2 or more falls or a fall with injury in the past year?No  Prior Activity Level Community (5-7x/wk): Prior to admission patient was independent, driving, and working intermittently for car  dealers transporting cars.  He moved in with his sister when he started paritoneal dialysis, but she was not assisting him in anyway.  She reports just being there in case he needed something.  Patient has now been transistioned to outpatient HD T, Th, Sat., is debilitated, and requires intensive therapies to regain his independence.      Home Assistive Devices / Equipment Home Assistive Devices/Equipment: None Home Equipment: None  Prior Device Use: Indicate devices/aids used by the patient prior to current illness, exacerbation or injury? None of the above  Prior Functional Level Prior Function Level of Independence: Independent 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   Self Care: Did the patient need help bathing, dressing, using the toilet or eating?  Independent  Indoor Mobility: Did the patient need assistance with walking from room to room (with or without device)? Independent  Stairs: Did the patient need assistance with internal or external stairs (with or without device)? Independent  Functional Cognition: Did the patient need help planning regular tasks such as shopping or remembering to take medications? Independent  Current Functional Level Cognition Overall Cognitive Status: Within Functional Limits for tasks assessed Difficult to assess due to: Level of arousal Current Attention Level: Sustained Orientation Level: Oriented X4 General Comments: A&Ox4; difficult to understand at times due to muffled speech.    Extremity Assessment (includes Sensation/Coordination) Upper Extremity Assessment: Overall WFL for tasks assessed  Lower Extremity Assessment: Defer to PT evaluation   ADLs Overall ADL's : Needs assistance/impaired Grooming: Minimal assistance, Standing, Wash/dry face, Oral care Grooming Details (indicate cue type and reason): Min assist for standing balance. Pt with bil knee buckling, forward flexed posture, and posterior lean  when fatigues. Upper Body Bathing: Maximal assistance Lower Body Bathing: Total assistance Upper Body Dressing : Maximal assistance Lower Body Dressing: Min guard Lower Body Dressing Details (indicate cue type and reason): To don socks in sitting Toilet Transfer: Minimal assistance, Ambulation, Regular Toilet, RW Toilet Transfer Details (indicate cue type and reason): Simulated by sit to stand from EOB with functional mobility. Functional mobility during ADLs: Minimal assistance, Rolling walker General ADL Comments: Continues to demonstrate bil LE weakness and knee buckling. Fatigues quickly with functional activities.   Mobility Overal bed mobility: Needs Assistance Bed Mobility: Supine to Sit Supine to sit: Min guard General bed mobility comments: HOB flat, increased time required.   Transfers Overall transfer level: Needs assistance Equipment used: Rolling walker (2 wheeled) Transfers: Sit to/from Stand Sit to Stand: Min assist Stand pivot transfers: Max assist General transfer comment: Min assist for balance with sit to stand from EOB x1. Cues for hand placement. Increased time required.   Ambulation / Gait / Stairs / Scientist, clinical (histocompatibility and immunogenetics) / Balance Balance Overall balance assessment: Needs assistance Sitting-balance support: Feet supported, No upper extremity supported Sitting balance-Leahy Scale: Good Standing balance support: No upper extremity supported, During functional activity Standing balance-Leahy Scale: Poor Standing balance comment: Min assist for balance during grooming tasks at sink without UE support   Special needs/care consideration BiPAP/CPAP: No CPM: No Continuous Drip IV: No Dialysis: Yes         Days: T, Th, Sat Life Vest: No Oxygen: No Special Bed: No Trach Size: No Wound Vac (area): No       Skin: Dry                              Bowel mgmt: Continent 11/30/16 Bladder mgmt: Continent and still makes urine  Diabetic mgmt: Yes patient checked  CBGs and took oral and sliding  scale insulin for management PTA    Previous Home Environment Living Arrangements: Other relatives (with sister) Available Help at Discharge: Family, Available PRN/intermittently Type of Home: House Home Layout: Two level, Bed/bath upstairs Alternate Level Stairs-Rails: Right Alternate Level Stairs-Number of Steps: 1 flight Home Access: Stairs to enter Entrance Stairs-Rails: Right Entrance Stairs-Number of Steps: flight Bathroom Shower/Tub: Walk-in shower Home Care Services: No  Discharge Living Setting Plans for Discharge Living Setting: Lives with (comment) (sister [name] ) Type of Home at Discharge: House Discharge Home Layout: Two level, Bed/bath upstairs Alternate Level Stairs-Rails: Right Alternate Level Stairs-Number of Steps: 1 flight  Discharge Home Access: Stairs to enter Entrance Stairs-Rails: Right Entrance Stairs-Number of Steps: 4 Discharge Bathroom Shower/Tub: Tub/shower unit, Curtain Discharge Bathroom Toilet: Standard Discharge Bathroom Accessibility: Yes How Accessible: Accessible via walker Does the patient have any problems obtaining your medications?: Yes (Describe) (sister says he might have to wait to fill Rx until pay day )  Social/Family/Support Systems Patient Roles: Parent, Other (Comment) (sibling) Contact Information: Sister [name] [phone] Anticipated Caregiver: Sister intermittent supervision only as she is disabled and cares for her grandchild  Anticipated Caregiver's Contact Information: if hands on assist is needed [place] notified son, Sterling III that he will need to help; Geno Sydnor [phone] Ability/Limitations of Caregiver: sister can only provide intermittent supervision; see above  Caregiver Availability: Intermittent Discharge Plan Discussed with Primary Caregiver: Yes Is Caregiver In Agreement with Plan?: Yes Does Caregiver/Family have Issues with Lodging/Transportation while Pt is in  Rehab?: No  Goals/Additional Needs Patient/Family Goal for Rehab: PT/OT Mod I  Expected length of stay: 7-10 days  Cultural Considerations: None Dietary Needs: Renal and Carb Modified restrictions  Equipment Needs: TBD Special Service Needs: HD T, TH, Sat Additional Information: Patient's outpatient CLIP process was initiated 11/30/16 Pt/Family Agrees to Admission and willing to participate: Yes Program Orientation Provided & Reviewed with Pt/Caregiver Including Roles  & Responsibilities: Yes Additional Information Needs: Patient with recent transition to HD from paritoneal dialysis  Information Needs to be Provided By: FYI for medical team for education purposes    Decrease burden of Care through IP rehab admission: No  Possible need for SNF placement upon discharge: No   Patient Condition: This patient's condition remains as documented in the consult dated 11/30/16, in which the Rehabilitation Physician determined and documented that the patient's condition is appropriate for intensive rehabilitative care in an inpatient rehabilitation facility. Will admit to inpatient rehab today.   Preadmission Screen Completed By:  [name], 12/01/2016 11:07 AM ______________________________________________________________________   Discussed status with Dr. Naaman Plummer on 12/01/16 at 1122 and received telephone approval for admission today.  Admission Coordinator:  [name], time 1122/Date 12/01/16       Cosigned by: [name], MD at 12/01/2016 12:30 PM  Revision History

## 2016-12-01 NOTE — PMR Pre-admission (Signed)
PMR Admission Coordinator Pre-Admission Assessment  Patient: Jared [name] is an 61 y.o., male MRN: [id number] DOB: 01-14-1956 Height: 5\' 10"  (177.8 cm) Weight: 75.8 kg (167 lb 1.7 oz)              Insurance Information HMO:     PPO:      PCP:      IPA:      80/20:      OTHER:  PRIMARY: Medicare A & B      Policy#: [id number]      Subscriber: Self CM Name:       Phone#:      Fax#:  Pre-Cert#: eligible per [employer]      Employer: disabled Benefits:  Phone #:      Name:  Eff. Date: 06/27/14     Deduct: $1340      Out of Pocket Max: none      Life Max: none CIR: 100%      SNF: 100% days 1-20; 80% days 21-100 Outpatient: 80%     Co-Pay: 20% Home Health: 100%      Co-Pay: none DME: 80%     Co-Pay: 20% Providers: patient's choice   Medicaid Application Date:       Case Manager:  Disability Application Date:       Case Worker:   Emergency Contact Information Contact Information    Name Relation Home Work Mobile   Perley Sister [phone]     Honest, Vanleer The Third Son   [phone]   Clifton,Linda Sister   [phone]     Current Medical History  Patient Admitting Diagnosis: Debility and encephalopathy after seizure and other medical complications  History of Present Illness: Jared [name] a 61 y.o.malewith history of 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 He was intubated for airway protection and was loaded with keppra. CTA head/neck negative for large vessel occlusion. MRI brain negative for acute changes. Seizures felt to be due to hyperglycemia and likely contributing to RUE weakness. Dr. Leonel Ramsay recommends continuing [place]. Dose decreased due to sedation and delirium resolving. BP control improving with addition of Losartan and off insulin drip. Transitioned to HD and requried L-AV fistula thrombolysis by radiology on 01/4. Therapy evaluations done  revealing generalized weakness with delayed processing and muffled speech. Patient admitted to IP Rehab 11/30/16.     Past Medical History  Past Medical History:  Diagnosis Date  . Anemia   . Chronic kidney disease    on dialysis M-W-F  . Diabetes mellitus without complication ([place])    onset as adult  . Hypertension   . Thyroid disease    hyperparathyroidism    Family History  family history includes Cancer - Lung in his mother; Cancer - Other in his father.  Prior Rehab/Hospitalizations:  Has the patient had major surgery during 100 days prior to admission? No  Current Medications   Current Facility-Administered Medications:  .  0.9 %  sodium chloride infusion, 250 mL, Intravenous, PRN, Dannielle Burn, MD .  amLODipine (NORVASC) tablet 10 mg, 10 mg, Oral, Daily, Erick Colace, NP, 10 mg at 12/01/16 0948 .  calcitRIOL (ROCALTROL) capsule 0.5 mcg, 0.5 mcg, Oral, Daily, Erick Colace, NP, 0.5 mcg at 12/01/16 0948 .  calcium carbonate (TUMS - dosed in mg elemental calcium) chewable tablet 400 mg of elemental calcium, 400 mg of elemental calcium, Oral, TID WC, Jamal Maes, MD,  400 mg of elemental calcium at 12/01/16 0829 .  Darbepoetin Alfa (ARANESP) injection 60 mcg, 60 mcg, Intravenous, Q Tue-HD, Jamal Maes, MD, 60 mcg at 11/28/16 1846 .  feeding supplement (NEPRO CARB STEADY) liquid 237 mL, 237 mL, Oral, BID BM, Jamal Maes, MD, 237 mL at 12/01/16 0948 .  gentamicin cream (GARAMYCIN) 0.1 % 1 application, 1 application, Topical, Daily, Roney Jaffe, MD, 1 application at 67/61/95 0949 .  heparin injection 5,000 Units, 5,000 Units, Subcutaneous, Q8H, Dannielle Burn, MD, 5,000 Units at 12/01/16 0555 .  hydrALAZINE (APRESOLINE) tablet 100 mg, 100 mg, Oral, TID, Erick Colace, NP, 100 mg at 12/01/16 0948 .  insulin aspart (novoLOG) injection 0-20 Units, 0-20 Units, Subcutaneous, TID WC, Erick Colace, NP, 4 Units at 11/30/16 1804 .  insulin aspart (novoLOG)  injection 0-5 Units, 0-5 Units, Subcutaneous, QHS, Erick Colace, NP .  insulin NPH Human (HUMULIN N,NOVOLIN N) injection 10 Units, 10 Units, Subcutaneous, BID AC & HS, Erick Colace, NP, 10 Units at 12/01/16 [phone] .  labetalol (NORMODYNE,TRANDATE) injection 20 mg, 20 mg, Intravenous, Q10 min PRN, Anders Simmonds, MD, 20 mg at 11/29/16 0731 .  lidocaine (PF) (XYLOCAINE) 1 % injection, , , PRN, Markus Daft, MD, 10 mL at 11/30/16 1511 .  losartan (COZAAR) tablet 50 mg, 50 mg, Oral, BID, Jamal Maes, MD, 50 mg at 12/01/16 0948 .  MEDLINE mouth rinse, 15 mL, Mouth Rinse, BID, Collene Gobble, MD, 15 mL at 11/30/16 2223 .  metoprolol (LOPRESSOR) tablet 100 mg, 100 mg, Oral, BID, Jamal Maes, MD, 100 mg at 12/01/16 0948 .  multivitamin (RENA-VIT) tablet 1 tablet, 1 tablet, Oral, QHS, Erick Colace, NP, 1 tablet at 11/30/16 2222 .  sevelamer carbonate (RENVELA) tablet 800 mg, 800 mg, Oral, TID WC, Erick Colace, NP, 800 mg at 12/01/16 6712  Patients Current Diet: DIET SOFT Room service appropriate? Yes; Fluid consistency: Thin; Fluid restriction: Other (see comments)  Precautions / Restrictions Precautions Precautions: Fall Precaution Comments: peritoneal access abdomen Restrictions Weight Bearing Restrictions: No   Has the patient had 2 or more falls or a fall with injury in the past year?No  Prior Activity Level Community (5-7x/wk): Prior to admission patient was independent, driving, and working intermittently for car dealers transporting cars.  He moved in with his sister when he started paritoneal dialysis, but she was not assisting him in anyway.  She reports just being there in case he needed something.  Patient has now been transistioned to outpatient HD T, Th, Sat., is debilitated, and requires intensive therapies to regain his independence.      Home Assistive Devices / Equipment Home Assistive Devices/Equipment: None Home Equipment: None  Prior Device Use: Indicate  devices/aids used by the patient prior to current illness, exacerbation or injury? None of the above  Prior Functional Level Prior Function Level of Independence: Independent 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   Self Care: Did the patient need help bathing, dressing, using the toilet or eating?  Independent  Indoor Mobility: Did the patient need assistance with walking from room to room (with or without device)? Independent  Stairs: Did the patient need assistance with internal or external stairs (with or without device)? Independent  Functional Cognition: Did the patient need help planning regular tasks such as shopping or remembering to take medications? Independent  Current Functional Level Cognition  Overall Cognitive Status: Within Functional Limits for tasks assessed Difficult to assess due to: Level  of arousal Current Attention Level: Sustained Orientation Level: Oriented X4 General Comments: A&Ox4; difficult to understand at times due to muffled speech.    Extremity Assessment (includes Sensation/Coordination)  Upper Extremity Assessment: Overall WFL for tasks assessed  Lower Extremity Assessment: Defer to PT evaluation    ADLs  Overall ADL's : Needs assistance/impaired Grooming: Minimal assistance, Standing, Wash/dry face, Oral care Grooming Details (indicate cue type and reason): Min assist for standing balance. Pt with bil knee buckling, forward flexed posture, and posterior lean when fatigues. Upper Body Bathing: Maximal assistance Lower Body Bathing: Total assistance Upper Body Dressing : Maximal assistance Lower Body Dressing: Min guard Lower Body Dressing Details (indicate cue type and reason): To don socks in sitting Toilet Transfer: Minimal assistance, Ambulation, Regular Toilet, RW Toilet Transfer Details (indicate cue type and reason): Simulated by sit to stand from EOB with functional mobility. Functional mobility during  ADLs: Minimal assistance, Rolling walker General ADL Comments: Continues to demonstrate bil LE weakness and knee buckling. Fatigues quickly with functional activities.    Mobility  Overal bed mobility: Needs Assistance Bed Mobility: Supine to Sit Supine to sit: Min guard General bed mobility comments: HOB flat, increased time required.    Transfers  Overall transfer level: Needs assistance Equipment used: Rolling walker (2 wheeled) Transfers: Sit to/from Stand Sit to Stand: Min assist Stand pivot transfers: Max assist General transfer comment: Min assist for balance with sit to stand from EOB x1. Cues for hand placement. Increased time required.    Ambulation / Gait / Stairs / Office manager / Balance Balance Overall balance assessment: Needs assistance Sitting-balance support: Feet supported, No upper extremity supported Sitting balance-Leahy Scale: Good Standing balance support: No upper extremity supported, During functional activity Standing balance-Leahy Scale: Poor Standing balance comment: Min assist for balance during grooming tasks at sink without UE support    Special needs/care consideration BiPAP/CPAP: No CPM: No Continuous Drip IV: No Dialysis: Yes         Days: T, Th, Sat Life Vest: No Oxygen: No Special Bed: No Trach Size: No Wound Vac (area): No       Skin: Dry                              Bowel mgmt: Continent 11/30/16 Bladder mgmt: Continent and still makes urine  Diabetic mgmt: Yes patient checked CBGs and took oral and sliding scale insulin for management PTA     Previous Home Environment Living Arrangements: Other relatives (with sister) Available Help at Discharge: Family, Available PRN/intermittently Type of Home: House Home Layout: Two level, Bed/bath upstairs Alternate Level Stairs-Rails: Right Alternate Level Stairs-Number of Steps: 1 flight Home Access: Stairs to enter Entrance Stairs-Rails: Right Entrance Stairs-Number  of Steps: flight Bathroom Shower/Tub: Walk-in shower Home Care Services: No  Discharge Living Setting Plans for Discharge Living Setting: Lives with (comment) (sister [place] ) Type of Home at Discharge: House Discharge Home Layout: Two level, Bed/bath upstairs Alternate Level Stairs-Rails: Right Alternate Level Stairs-Number of Steps: 1 flight  Discharge Home Access: Stairs to enter Entrance Stairs-Rails: Right Entrance Stairs-Number of Steps: 4 Discharge Bathroom Shower/Tub: Tub/shower unit, Curtain Discharge Bathroom Toilet: Standard Discharge Bathroom Accessibility: Yes How Accessible: Accessible via walker Does the patient have any problems obtaining your medications?: Yes (Describe) (sister says he might have to wait to fill Rx until pay day )  Social/Family/Support Systems Patient Roles: Parent, Other (Comment) (  sibling) Contact Information: Sister [name] [phone] Anticipated Caregiver: Sister intermittent supervision only as she is disabled and cares for her grandchild  Anticipated Caregiver's Contact Information: if hands on assist is needed [place] notified son, Seaton III that he will need to help; Gurvir Schrom [phone] Ability/Limitations of Caregiver: sister can only provide intermittent supervision; see above  Caregiver Availability: Intermittent Discharge Plan Discussed with Primary Caregiver: Yes Is Caregiver In Agreement with Plan?: Yes Does Caregiver/Family have Issues with Lodging/Transportation while Pt is in Rehab?: No  Goals/Additional Needs Patient/Family Goal for Rehab: PT/OT Mod I  Expected length of stay: 7-10 days  Cultural Considerations: None Dietary Needs: Renal and Carb Modified restrictions  Equipment Needs: TBD Special Service Needs: HD T, TH, Sat Additional Information: Patient's outpatient CLIP process was initiated 11/30/16 Pt/Family Agrees to Admission and willing to participate: Yes Program Orientation Provided & Reviewed with  Pt/Caregiver Including Roles  & Responsibilities: Yes Additional Information Needs: Patient with recent transition to HD from paritoneal dialysis  Information Needs to be Provided By: FYI for medical team for education purposes    Decrease burden of Care through IP rehab admission: No  Possible need for SNF placement upon discharge: No   Patient Condition: This patient's condition remains as documented in the consult dated 11/30/16, in which the Rehabilitation Physician determined and documented that the patient's condition is appropriate for intensive rehabilitative care in an inpatient rehabilitation facility. Will admit to inpatient rehab today.   Preadmission Screen Completed By:  [name], 12/01/2016 11:07 AM ______________________________________________________________________   Discussed status with Dr. Naaman Plummer on 12/01/16 at 1122 and received telephone approval for admission today.  Admission Coordinator:  [name], time 1122/Date 12/01/16

## 2016-12-01 NOTE — Discharge Summary (Signed)
Physician Discharge Summary  Jared [name] HWE:[id number] DOB: March 15, 1956 DOA: 11/24/2016  PCP: Lucrezia Starch, MD  Admit date: 11/24/2016 Discharge date: 12/01/2016  Time spent: > 35 minutes  Recommendations for Outpatient Follow-up:  1. Monitor blood sugars and adjust antihyperglycemic agents accordingly 2. Pt to f/u with Neurology as outpatient   Discharge Diagnoses:  Active Problems:   Seizure ([place])   Status epilepticus ([place])   Acute respiratory failure with hypoxia (H[facility])   Hypertension   CKD (chronic kidney disease) stage V requiring chronic dialysis [facility])   Discharge Condition: stable  Diet recommendation: renal diet/Diabetic diet  Filed Weights   11/30/16 0743 11/30/16 1045 11/30/16 2053  Weight: 76.3 kg (168 lb 3.4 oz) 75.6 kg (166 lb 10.7 oz) 75.8 kg (167 lb 1.7 oz)    History of present illness:  61 y.o. male with ESRD secondary to DM and HTN who started dialysis May 2015 and subsequently transferred to [facility].  He dialyzed at [facility] before transitioning to PD February 2016 and is followed by Dr. Jamal Maes who last saw him 12/20 at his routine monthly appointment. At that time home BP per her not was variable, but generally ok. Home PD has been going well.He reported some insurance issues and variable compliance with calcitriol. Kinetics and Hgb have been good, iPTH is elevated.  P variable but better lately BS tend to run high - last hgb A1C was 9.4 in October.  He is seen today following admission for AMS, seizure and hyperglycemia following admission last pm  Hospital Course:  Active Problems:   Seizure [facility]) - Patient was administered Keppra initially. Patient to follow-up with neurology   DM - Would recommend the patient is currently on, insulin NPH human (Humulin N, Novolin N) 10 units subcutaneous twice a day before meals and at bedtime. With sliding-scale insulin    Acute respiratory failure with hypoxia (HCC) - resolved    Hypertension -  Hydralazine - continue losartan and metoprolol    CKD (chronic kidney disease) stage V requiring chronic dialysis ([place]) - Pt to continue routine Follow-up with nephrologist  Procedures:  HD  Consultations:  Nephrology  Neurology  Discharge Exam: Vitals:   12/01/16 0446 12/01/16 0943  BP: (!) 153/76 (!) 182/87  Pulse: 90 96  Resp: 19 18  Temp: 98.5 F (36.9 C) 99.8 F (37.7 C)    General: Pt in nad, alert and awake Cardiovascular: rrr, no rubs Respiratory: no increased wob, no wheezes  Discharge Instructions   Discharge Instructions    Ambulatory referral to Neurology    Complete by:  As directed    An appointment is requested in approximately: 2 weeks   Call MD for:  severe uncontrolled pain    Complete by:  As directed    Call MD for:  temperature >100.4    Complete by:  As directed    Diet - low sodium heart healthy    Complete by:  As directed    Discharge instructions    Complete by:  As directed    Continue insulin nph at current regimen with SSI.   Increase activity slowly    Complete by:  As directed      Current Discharge Medication List    START taking these medications   Details  calcium carbonate (TUMS - DOSED IN MG ELEMENTAL CALCIUM) 500 MG chewable tablet Chew 2 tablets (400 mg of elemental calcium total) by mouth 3 (three) times daily with meals. Qty: 30 tablet, Refills: 0  insulin aspart (NOVOLOG) 100 UNIT/ML injection Inject 0-20 Units into the skin 3 (three) times daily with meals. Qty: 10 mL, Refills: 0    losartan (COZAAR) 50 MG tablet Take 1 tablet (50 mg total) by mouth 2 (two) times daily at 10 AM and 5 PM. Qty: 60 tablet, Refills: 0      CONTINUE these medications which have CHANGED   Details  insulin NPH Human (HUMULIN N,NOVOLIN N) 100 UNIT/ML injection Inject 0.1 mLs (10 Units total) into the skin 2 (two) times daily at 8 am and 10 pm. Qty: 10 mL, Refills: 0      CONTINUE these medications which have NOT CHANGED    Details  amLODipine (NORVASC) 10 MG tablet Take 10 mg by mouth daily.    b complex-vitamin c-folic acid (NEPHRO-VITE) 0.8 MG TABS tablet Take 1 tablet by mouth at bedtime.    calcitRIOL (ROCALTROL) 0.5 MCG capsule Take 0.5 mcg by mouth daily.    cinacalcet (SENSIPAR) 30 MG tablet Take 30 mg by mouth daily.    hydrALAZINE (APRESOLINE) 100 MG tablet Take 100 mg by mouth 3 (three) times daily.    metoprolol (LOPRESSOR) 100 MG tablet Take 100 mg by mouth 2 (two) times daily.     oxyCODONE (ROXICODONE) 5 MG immediate release tablet Take 1 tablet (5 mg total) by mouth every 6 (six) hours as needed for severe pain. Qty: 10 tablet, Refills: 0      STOP taking these medications     insulin regular (NOVOLIN R,HUMULIN R) 100 units/mL injection      sevelamer carbonate (RENVELA) 800 MG tablet        Allergies  Allergen Reactions  . Penicillins Other (See Comments)    Has patient had a PCN reaction causing immediate rash, facial/tongue/throat swelling, SOB or lightheadedness with hypotension: Unk Has patient had a PCN reaction causing severe rash involving mucus membranes or skin necrosis: Unk Has patient had a PCN reaction that required hospitalization: Unk Has patient had a PCN reaction occurring within the last 10 years: Unk If all of the above answers are "NO", then may proceed with Cephalosporin use.       The results of significant diagnostics from this hospitalization (including imaging, microbiology, ancillary and laboratory) are listed below for reference.    Significant Diagnostic Studies: Ct Angio Head W Or Wo Contrast  Result Date: 11/24/2016 CLINICAL DATA:  Dialysis patient. Diabetes. Unresponsive. Negative acute head CT. EXAM: CT ANGIOGRAPHY HEAD AND NECK TECHNIQUE: Multidetector CT imaging of the head and neck was performed using the standard protocol during bolus administration of intravenous contrast. Multiplanar CT image reconstructions and MIPs were obtained to  evaluate the vascular anatomy. Carotid stenosis measurements (when applicable) are obtained utilizing NASCET criteria, using the distal internal carotid diameter as the denominator. CONTRAST:  50 cc Isovue 370 COMPARISON:  Earlier same day CT FINDINGS: CTA NECK FINDINGS Aortic arch: Mild atherosclerotic change. No aneurysm or dissection. Common origin of the innominate artery and left common carotid artery. Right carotid system: Common carotid artery is widely patent to the bifurcation. The carotid bifurcation does not show atherosclerotic change. No narrowing or irregularity. Cervical ICA is normal. Left carotid system: Common carotid artery is widely patent to the bifurcation. The carotid bifurcation does not show atherosclerotic change. No narrowing or irregularity. Cervical ICA is normal. Vertebral arteries: Both vertebral artery origins are widely patent. Minimal atherosclerotic calcification at the left vertebral artery origin. Both vertebral arteries are widely patent through the cervical region to  the foramen magnum. Skeleton: Negative Other neck: No significant soft tissue finding. Patient is intubated. Upper chest: Patchy dependent atelectasis and are infiltrate bilaterally. Possible aspiration or community acquired pneumonia. Review of the MIP images confirms the above findings CTA HEAD FINDINGS Anterior circulation: Both internal carotid arteries are widely patent through the skullbase and siphon regions. Mild peripheral siphon calcification without stenosis. Anterior and middle cerebral vessels are patent without stenosis, aneurysm or vascular malformation. No missing branch vessels are identified. Posterior circulation: Both vertebral arteries are widely patent to the basilar. Mild calcification at the foramen magnum level without stenosis. No vertebral stenosis. Posterior circulation branch vessels are normal. Venous sinuses: Patent and normal Anatomic variants: None significant Delayed phase: No  abnormal enhancement Review of the MIP images confirms the above findings IMPRESSION: CT angiography is negative for large vessel occlusion. Carotid bifurcations are normal. Only very minimal atherosclerotic change is demonstrated in the region imaged. These results were called by telephone at the time of interpretation on 11/24/2016 at 6:40 pm to Dr. Kerney Elbe , who verbally acknowledged these results. Initial code stroke head CT results also communicated at this time. Electronically Signed   By: Nelson Chimes M.D.   On: 11/24/2016 18:47   Ct Angio Neck W Or Wo Contrast  Result Date: 11/24/2016 CLINICAL DATA:  Dialysis patient. Diabetes. Unresponsive. Negative acute head CT. EXAM: CT ANGIOGRAPHY HEAD AND NECK TECHNIQUE: Multidetector CT imaging of the head and neck was performed using the standard protocol during bolus administration of intravenous contrast. Multiplanar CT image reconstructions and MIPs were obtained to evaluate the vascular anatomy. Carotid stenosis measurements (when applicable) are obtained utilizing NASCET criteria, using the distal internal carotid diameter as the denominator. CONTRAST:  50 cc Isovue 370 COMPARISON:  Earlier same day CT FINDINGS: CTA NECK FINDINGS Aortic arch: Mild atherosclerotic change. No aneurysm or dissection. Common origin of the innominate artery and left common carotid artery. Right carotid system: Common carotid artery is widely patent to the bifurcation. The carotid bifurcation does not show atherosclerotic change. No narrowing or irregularity. Cervical ICA is normal. Left carotid system: Common carotid artery is widely patent to the bifurcation. The carotid bifurcation does not show atherosclerotic change. No narrowing or irregularity. Cervical ICA is normal. Vertebral arteries: Both vertebral artery origins are widely patent. Minimal atherosclerotic calcification at the left vertebral artery origin. Both vertebral arteries are widely patent through the  cervical region to the foramen magnum. Skeleton: Negative Other neck: No significant soft tissue finding. Patient is intubated. Upper chest: Patchy dependent atelectasis and are infiltrate bilaterally. Possible aspiration or community acquired pneumonia. Review of the MIP images confirms the above findings CTA HEAD FINDINGS Anterior circulation: Both internal carotid arteries are widely patent through the skullbase and siphon regions. Mild peripheral siphon calcification without stenosis. Anterior and middle cerebral vessels are patent without stenosis, aneurysm or vascular malformation. No missing branch vessels are identified. Posterior circulation: Both vertebral arteries are widely patent to the basilar. Mild calcification at the foramen magnum level without stenosis. No vertebral stenosis. Posterior circulation branch vessels are normal. Venous sinuses: Patent and normal Anatomic variants: None significant Delayed phase: No abnormal enhancement Review of the MIP images confirms the above findings IMPRESSION: CT angiography is negative for large vessel occlusion. Carotid bifurcations are normal. Only very minimal atherosclerotic change is demonstrated in the region imaged. These results were called by telephone at the time of interpretation on 11/24/2016 at 6:40 pm to Dr. Kerney Elbe , who verbally acknowledged these results. Initial  code stroke head CT results also communicated at this time. Electronically Signed   By: Nelson Chimes M.D.   On: 11/24/2016 18:47   Mr Brain Wo Contrast  Result Date: 11/27/2016 CLINICAL DATA:  Unresponsive, hyperglycemia and seizures. History of hypertension, diabetes and end-stage renal disease on dialysis. EXAM: MRI HEAD WITHOUT CONTRAST TECHNIQUE: Multiplanar, multiecho pulse sequences of the brain and surrounding structures were obtained without intravenous contrast. COMPARISON:  CT HEAD November 24, 2016 FINDINGS: BRAIN: No reduced diffusion to suggest acute ischemia. No  susceptibility artifact to suggest hemorrhage. The ventricles and sulci are normal for patient's age. No suspicious parenchymal signal, masses or mass effect. No abnormal extra-axial fluid collections. No extra-axial masses though, contrast enhanced sequences would be more sensitive. Symmetric normal size, morphology and signal of the hippocampi. VASCULAR: Normal major intracranial vascular flow voids present at skull base. SKULL AND UPPER CERVICAL SPINE: No abnormal sellar expansion. No suspicious calvarial bone marrow signal. Craniocervical junction maintained. Moderate LEFT temporomandibular osteoarthrosis. Small RIGHT temporomandibular joint effusion. SINUSES/ORBITS: Trace paranasal sinus mucosal thickening. RIGHT concha bullosa. Small mastoid effusions. Prominent superior ophthalmic veins compatible with ventilation. The included ocular globes and orbital contents are non-suspicious. OTHER: LEFT suboccipital small scalp lipoma. Life-support lines in place. IMPRESSION: Negative MRI head for age. Electronically Signed   By: Elon Alas M.D.   On: 11/27/2016 01:19   Ir US Guide Vasc Access Left  Result Date: 11/30/2016 INDICATION: 61 year old with end-stage renal disease. Patient has a left upper arm brachiocephalic fistula with poor flows and pulling clots. EXAM: AV FISTULA DECLOT WITH THROMBOLYSIS, MECHANICAL THROMBECTOMY, BALLOON ANGIOPLASTY AND STENT PLACEMENT ULTRASOUND GUIDANCE FOR VASCULAR ACCESS Physician: Stephan Minister. Anselm Pancoast, MD MEDICATIONS: None. ANESTHESIA/SEDATION: Heparin 5000 units, TPA 2 mg, 100 mcg fentanyl Patient was monitored by a radiology nurse throughout the procedure. FLUOROSCOPY TIME:  Fluoroscopy Time: 18 minutes12 seconds (42 mGy). COMPLICATIONS: None immediate. PROCEDURE: The procedure was explained to the patient. The risks and benefits of the procedure were discussed and the patient's questions were addressed. Informed consent was obtained from the patient. The left AV fistula was  initially patent. Fistula was punctured with an Angiocath and a series of fistulogram images were obtained. Initially, we planned to perform only balloon angioplasty and possible stent placement. The left arm was prepped and draped in sterile fashion. Maximal barrier sterile technique was utilized including caps, mask, sterile gowns, sterile gloves, sterile drape, hand hygiene and skin antiseptic. The skin around the catheter was anesthetized with 1% lidocaine. The Angiocath was exchanged for a 6 [place] vascular sheath was some difficulty. Eventually, 6 French sheath was advanced over a stiff Amplatz wire. Following placement of the 6 French vascular sheath, there was thrombosis of the outflow vein from the elbow to the upper humerus region. At this point, the procedure was converted to a declot procedure. A suitable area for a second venous access was identified in the upper arm using ultrasound. Skin was anesthetized with 1% lidocaine. A 21 gauge needle was directed into the cephalic vein with ultrasound guidance and a micropuncture dilator set was placed and directed towards the arterial anastomosis. Patient was given IV heparin. 2 mg of tPA was injected along the occluded outflow vein in the left upper arm. The outflow vein was treated with the Angiojet thrombectomy device. The areas of known stenosis were angioplastied with a 6 mm x 40 mm Mustang balloon. The micropuncture dilator pointing towards the anastomosis was upsized to a 6 [place] vascular sheath. Eventually, a 5 [place] Kumpe  the catheter was advanced across the arterial anastomosis with a Glidewire. It was very difficult to advance a wire beyond the clot. Subsequently, the arterial plug was pulled using an over-the-wire Fogarty balloon. After the arterial plug was removed, there was flow throughout the fistula. Follow-up fistulogram images demonstrated recurrent areas of venous stenosis which were again treated with the 6 mm angioplasty balloon.  Additional angiogram images demonstrated stenosis the vein just beyond the anastomosis. Therefore, a 5 mm x 20 mm Mustang balloon was used to angioplasty this segment. Follow-up angiogram images were obtained. Follow up angiogram images again demonstrated recurrent stenosis in the upper arm cephalic vein. This area was treated again with the 6 mm balloon with minimal improvement. Therefore, this area was treated with an 8 mm x 40 mm self expanding Smart Flex stent. Stent was successfully placed across the stenosis and dilated with a 8 mm x 40 mm balloon. Follow-up angiogram images were obtained. Both vascular sheaths were removed with pursestring sutures. FINDINGS: Fistulogram images demonstrated multiple areas of stenosis involving the left upper arm cephalic vein. There was a moderate-to-severe stenosis in the mid humeral region. At least 2 other areas of stenosis in the lower humeral region. One of the lower humeral stenoses was near the anastomosis. Following placement of the vascular sheath, there was thrombosis of the outflow vein. After flow was re-established within the fistula and outflow vein, there was recurrent stenosis in the mid humerus despite multiple angioplasties. This area of recurrent stenosis may have been the cause of the initial problems and thrombosis. Therefore, this area was stented. Post stent angiogram demonstrated that the stented segment was patent. Mild narrowing in the midportion of stent. Areas of narrowing proximal to the stent are compatible with vasospasm. IMPRESSION: Successful declot of the left upper arm AV fistula. Persistent area of stenosis in the left cephalic vein was treated with a stent. Electronically Signed   By: Markus Daft M.D.   On: 11/30/2016 18:14   Dg Chest Port 1 View  Result Date: 11/26/2016 CLINICAL DATA:  Atelectasis. EXAM: PORTABLE CHEST 1 VIEW COMPARISON:  Radiograph November 24, 2016. FINDINGS: Nasogastric and endotracheal tubes are unchanged in  position. No pneumothorax is noted. Hypoinflation of the lungs is noted with increased bibasilar opacities most consistent with atelectasis or infiltrates. Bony thorax is unremarkable. IMPRESSION: Stable support apparatus. Hypoinflation of the lungs with increased bibasilar atelectasis or infiltrates. Electronically Signed   By: Marijo Conception, M.D.   On: 11/26/2016 07:22   Dg Chest Portable 1 View  Result Date: 11/24/2016 CLINICAL DATA:  Status post intubation. EXAM: PORTABLE CHEST 1 VIEW COMPARISON:  06/04/2014 FINDINGS: Endotracheal tube is in place with tip approximately 3.1 cm above the carina. Nasogastric tube is in place with tip off the image beyond the gastroesophageal junction. Shallow lung inflation. Heart size is accentuated by the portable technique. There is right perihilar atelectasis. Perihilar fullness may be related to shallow lung inflation. However adenopathy cannot be excluded. IMPRESSION: 1. Status post intubation and placement of nasogastric tube. 2. Right perihilar atelectasis. 3. Perihilar fullness bilaterally and may be related to shallow inflation but adenopathy is not excluded. Follow-up chest x-rays and/or chest CT is recommended as appropriate. Electronically Signed   By: Nolon Nations M.D.   On: 11/24/2016 18:58   Ir Thrombectomy Av Fistula W/thrombolysis/pta/stent Inc Shunt Img Lt  Result Date: 11/30/2016 INDICATION: 61 year old with end-stage renal disease. Patient has a left upper arm brachiocephalic fistula with poor flows and pulling clots. EXAM:  AV FISTULA DECLOT WITH THROMBOLYSIS, MECHANICAL THROMBECTOMY, BALLOON ANGIOPLASTY AND STENT PLACEMENT ULTRASOUND GUIDANCE FOR VASCULAR ACCESS Physician: Stephan Minister. Anselm Pancoast, MD MEDICATIONS: None. ANESTHESIA/SEDATION: Heparin 5000 units, TPA 2 mg, 100 mcg fentanyl Patient was monitored by a radiology nurse throughout the procedure. FLUOROSCOPY TIME:  Fluoroscopy Time: 18 minutes12 seconds (42 mGy). COMPLICATIONS: None immediate.  PROCEDURE: The procedure was explained to the patient. The risks and benefits of the procedure were discussed and the patient's questions were addressed. Informed consent was obtained from the patient. The left AV fistula was initially patent. Fistula was punctured with an Angiocath and a series of fistulogram images were obtained. Initially, we planned to perform only balloon angioplasty and possible stent placement. The left arm was prepped and draped in sterile fashion. Maximal barrier sterile technique was utilized including caps, mask, sterile gowns, sterile gloves, sterile drape, hand hygiene and skin antiseptic. The skin around the catheter was anesthetized with 1% lidocaine. The Angiocath was exchanged for a 6 [place] vascular sheath was some difficulty. Eventually, 6 French sheath was advanced over a stiff Amplatz wire. Following placement of the 6 French vascular sheath, there was thrombosis of the outflow vein from the elbow to the upper humerus region. At this point, the procedure was converted to a declot procedure. A suitable area for a second venous access was identified in the upper arm using ultrasound. Skin was anesthetized with 1% lidocaine. A 21 gauge needle was directed into the cephalic vein with ultrasound guidance and a micropuncture dilator set was placed and directed towards the arterial anastomosis. Patient was given IV heparin. 2 mg of tPA was injected along the occluded outflow vein in the left upper arm. The outflow vein was treated with the Angiojet thrombectomy device. The areas of known stenosis were angioplastied with a 6 mm x 40 mm Mustang balloon. The micropuncture dilator pointing towards the anastomosis was upsized to a 6 [place] vascular sheath. Eventually, a 5 [place] Kumpe the catheter was advanced across the arterial anastomosis with a Glidewire. It was very difficult to advance a wire beyond the clot. Subsequently, the arterial plug was pulled using an over-the-wire Fogarty  balloon. After the arterial plug was removed, there was flow throughout the fistula. Follow-up fistulogram images demonstrated recurrent areas of venous stenosis which were again treated with the 6 mm angioplasty balloon. Additional angiogram images demonstrated stenosis the vein just beyond the anastomosis. Therefore, a 5 mm x 20 mm Mustang balloon was used to angioplasty this segment. Follow-up angiogram images were obtained. Follow up angiogram images again demonstrated recurrent stenosis in the upper arm cephalic vein. This area was treated again with the 6 mm balloon with minimal improvement. Therefore, this area was treated with an 8 mm x 40 mm self expanding Smart Flex stent. Stent was successfully placed across the stenosis and dilated with a 8 mm x 40 mm balloon. Follow-up angiogram images were obtained. Both vascular sheaths were removed with pursestring sutures. FINDINGS: Fistulogram images demonstrated multiple areas of stenosis involving the left upper arm cephalic vein. There was a moderate-to-severe stenosis in the mid humeral region. At least 2 other areas of stenosis in the lower humeral region. One of the lower humeral stenoses was near the anastomosis. Following placement of the vascular sheath, there was thrombosis of the outflow vein. After flow was re-established within the fistula and outflow vein, there was recurrent stenosis in the mid humerus despite multiple angioplasties. This area of recurrent stenosis may have been the cause of the initial problems and  thrombosis. Therefore, this area was stented. Post stent angiogram demonstrated that the stented segment was patent. Mild narrowing in the midportion of stent. Areas of narrowing proximal to the stent are compatible with vasospasm. IMPRESSION: Successful declot of the left upper arm AV fistula. Persistent area of stenosis in the left cephalic vein was treated with a stent. Electronically Signed   By: Markus Daft M.D.   On: 11/30/2016 18:14    Ct Head Code Stroke W/o Cm  Result Date: 11/24/2016 CLINICAL DATA:  Code stroke. Unresponsive. Dialysis patient. Hyperglycemia. Code stroke. EXAM: CT HEAD WITHOUT CONTRAST TECHNIQUE: Contiguous axial images were obtained from the base of the skull through the vertex without intravenous contrast. COMPARISON:  None. FINDINGS: Brain: Moderate generalized brain atrophy. Chronic appearing small vessel ischemic changes. No sign of acute infarction, mass lesion, hemorrhage, hydrocephalus or extra-axial collection. Vascular: Atherosclerotic calcification of the major vessels at the base of the brain. No more peripheral hyperdense vessels. Skull: Negative Sinuses/Orbits: Clear Other: None ASPECTS ([place] Stroke Program Early CT Score) - Ganglionic level infarction (caudate, lentiform nuclei, internal capsule, insula, M1-M3 cortex): 7 - Supraganglionic infarction (M4-M6 cortex): 3 Total score (0-10 with 10 being normal): 10 IMPRESSION: 1. Negative acute code stroke study. Atrophy and chronic small vessel change of the white matter. 2. ASPECTS is 10 Page was placed at the time of interpretation on 11/24/2016 at 6:20 pm. Still awaiting response. Electronically Signed   By: Nelson Chimes M.D.   On: 11/24/2016 18:23    Microbiology: Recent Results (from the past 240 hour(s))  Culture, blood (Routine X 2) w Reflex to ID Panel     Status: None   Collection Time: 11/24/16 10:30 PM  Result Value Ref Range Status   Specimen Description BLOOD RIGHT ARM  Final   Special Requests BOTTLES DRAWN AEROBIC AND ANAEROBIC 5ML  Final   Culture NO GROWTH 5 DAYS  Final   Report Status 11/30/2016 FINAL  Final  Culture, blood (Routine X 2) w Reflex to ID Panel     Status: None   Collection Time: 11/24/16 10:36 PM  Result Value Ref Range Status   Specimen Description BLOOD RIGHT HAND  Final   Special Requests BOTTLES DRAWN AEROBIC AND ANAEROBIC 5ML  Final   Culture NO GROWTH 5 DAYS  Final   Report Status 11/30/2016 FINAL   Final  MRSA PCR Screening     Status: None   Collection Time: 11/24/16 11:18 PM  Result Value Ref Range Status   MRSA by PCR NEGATIVE NEGATIVE Final    Comment:        The GeneXpert MRSA Assay (FDA approved for NASAL specimens only), is one component of a comprehensive MRSA colonization surveillance program. It is not intended to diagnose MRSA infection nor to guide or monitor treatment for MRSA infections.      Labs: Basic Metabolic Panel:  Recent Labs Lab 11/25/16 0952 11/25/16 1649 11/26/16 0453 11/26/16 1643 11/27/16 0822 11/28/16 0413 11/29/16 0449 11/30/16 0526  NA  --   --  135  --  136 135 137 136  K  --   --  5.0  --  5.2* 4.4 4.4 4.4  CL  --   --  98*  --  99* 96* 100* 98*  CO2  --   --  26  --  26 25 27 26   GLUCOSE  --   --  313*  --  299* 381* 173* 171*  BUN  --   --  80*  --  69* 67* 34* 48*  CREATININE  --   --  12.08*  --  10.72* 10.46* 6.57* 8.58*  CALCIUM  --   --  8.1*  --  7.9* 7.9* 8.1* 8.4*  MG 1.9 1.8 2.1 2.0  --   --   --  2.0  PHOS 4.5 6.6* 7.6* 7.9*  --   --   --  4.7*   Liver Function Tests:  Recent Labs Lab 11/24/16 1758 11/26/16 0453 11/27/16 0822 11/28/16 0413 11/29/16 0449  AST 32 20 15 20 22   ALT 22 15* 13* 13* 15*  ALKPHOS 69 45 50 49 52  BILITOT 0.6 0.3 0.5 0.5 0.3  PROT 7.5 5.3* 5.4* 5.6* 5.9*  ALBUMIN 3.1* 1.8* 1.7* 1.9* 2.0*   No results for input(s): LIPASE, AMYLASE in the last 168 hours.  Recent Labs Lab 11/27/16 0931  AMMONIA 49*   CBC:  Recent Labs Lab 11/24/16 1758  11/25/16 0732 11/26/16 0453 11/27/16 0822 11/28/16 0413 11/30/16 0526  WBC 6.7  < > 8.8 9.5 8.6 8.0 5.6  NEUTROABS 3.3  --   --   --   --   --   --   HGB 13.6  < > 11.5* 11.2* 10.8* 10.4* 9.9*  HCT 43.0  < > 36.4* 34.2* 32.6* 32.4* 30.7*  MCV 86.9  < > 84.5 82.4 81.1 81.8 82.7  PLT 245  < > 188 151 173 197 197  < > = values in this interval not displayed. Cardiac Enzymes: No results for input(s): CKTOTAL, CKMB, CKMBINDEX, TROPONINI  in the last 168 hours. BNP: BNP (last 3 results) No results for input(s): BNP in the last 8760 hours.  ProBNP (last 3 results) No results for input(s): PROBNP in the last 8760 hours.  CBG:  Recent Labs Lab 11/30/16 1138 11/30/16 1752 11/30/16 2048 12/01/16 0809 12/01/16 1219  GLUCAP 158* 173* 166* 83 122*    Signed:  Velvet Bathe MD.  [facility] 12/01/2016, 1:49 PM

## 2016-12-01 NOTE — Progress Notes (Signed)
Physical Therapy Treatment Patient Details Name: Jared [name] MRN: [id number] DOB: 01-Oct-1956 Today's Date: 12/01/2016    History of Present Illness Patient is a 61 y/o male with hx of ESRD on peritoneal dialysis, CKD, IDDM, HTN presents with AMS and concern for acute stroke with witnessed seizures in the ED. Intubated 12/29-1/1. Glucose >600.  Markedly hypertensive with BP 220s/110s. CT, MRI and CTA head negative.  Question profound hyperglycemia vs hypertensive encephalopathy precipitated his seizure    PT Comments    Progressing steadily with mobility and gait stability.  Mildly unsteady overall during standing tasks and benefits from RW during gait.   Follow Up Recommendations  CIR     Equipment Recommendations  Other (comment)    Recommendations for Other Services Rehab consult     Precautions / Restrictions Precautions Precautions: Fall Precaution Comments: peritoneal access abdomen Restrictions Weight Bearing Restrictions: No    Mobility  Bed Mobility Overal bed mobility: Needs Assistance Bed Mobility: Supine to Sit     Supine to sit: Min guard     General bed mobility comments: HOB flat, increased time required.  Transfers Overall transfer level: Needs assistance Equipment used: Rolling walker (2 wheeled) Transfers: Sit to/from Stand Sit to Stand: Min assist         General transfer comment: Min assist for balance with sit to stand from EOB x1. Cues for hand placement. Increased time required.  Ambulation/Gait Ambulation/Gait assistance: Min assist Ambulation Distance (Feet): 120 Feet Assistive device: Rolling walker (2 wheeled) Gait Pattern/deviations: Step-through pattern Gait velocity: slow Gait velocity interpretation: Below normal speed for age/gender General Gait Details: weak-kneed gait, mildly retropulsive, knees buckling with pt stands fully upright.   Stairs            Wheelchair Mobility    Modified Rankin (Stroke Patients  Only)       Balance Overall balance assessment: Needs assistance Sitting-balance support: Feet supported;No upper extremity supported Sitting balance-Leahy Scale: Good     Standing balance support: No upper extremity supported;During functional activity Standing balance-Leahy Scale: Poor Standing balance comment: reliant on the RW                    Cognition Arousal/Alertness: Awake/alert Behavior During Therapy: WFL for tasks assessed/performed Overall Cognitive Status: Within Functional Limits for tasks assessed                      Exercises      General Comments        Pertinent Vitals/Pain Pain Assessment: No/denies pain    Home Living                      Prior Function Level of Independence: Independent          PT Goals (current goals can now be found in the care plan section) Acute Rehab PT Goals Patient Stated Goal: get better and stronger PT Goal Formulation: With patient Time For Goal Achievement: 12/13/16 Potential to Achieve Goals: Fair Progress towards PT goals: Progressing toward goals    Frequency    Min 3X/week      PT Plan Current plan remains appropriate    Co-evaluation PT/OT/SLP Co-Evaluation/Treatment: Yes Reason for Co-Treatment: For patient/therapist safety PT goals addressed during session: Mobility/safety with mobility OT goals addressed during session: ADL's and self-care     End of Session Equipment Utilized During Treatment: Gait belt Activity Tolerance: Patient limited by lethargy Patient left: in chair;with call bell/phone  within reach;with chair alarm set     Time: [phone] PT Time Calculation (min) (ACUTE ONLY): 30 min  Charges:  $Gait Training: 8-22 mins                    G CodesTessie Fass Gala Padovano 12/01/2016, 12:32 PM 12/01/2016  Donnella Sham, PT [phone]  (pager)

## 2016-12-01 NOTE — Progress Notes (Signed)
[name], MD Physician Signed Physical Medicine and Rehabilitation  Consult Note Date of Service: 11/29/2016 3:50 PM  Related encounter: ED to Hosp-Admission (Current) from 11/24/2016 in [place] All Collapse All   [] Hide copied text [] Hover for attribution information      Physical Medicine and Rehabilitation Consult   Reason for Consult: New onset seizures/weakness Referring Physician: Dr.Angelo A de Dios   HPI: Jared [name] is a 61 y.o. 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 He was intubated for airway protection and was loaded with keppra.  CTA head/neck negative for large vessel occlusion. MRI brain negative for acute changes. Seizures felt to be due to hyperglycemia and likely contributing to RUE weakness. Dr. Leonel Ramsay recommends continuing [place]. Dose decreased due to sedation and delirium resolving. BP control improving with addition of Losartan and off insulin drip. Therapy evaluations done revealing generalized weakness with delayed processing and muffled speech. CIR recommended for follow up therapy.     Review of Systems  HENT: Negative for hearing loss.   Respiratory: Negative for cough and shortness of breath.   Cardiovascular: Negative for chest pain and palpitations.  Gastrointestinal: Negative for abdominal pain, heartburn and nausea.  Musculoskeletal: Negative for joint pain and myalgias.  Skin: Negative for rash.  Neurological: Positive for weakness. Negative for dizziness and sensory change.          Past Medical History:  Diagnosis Date  . Anemia   . Chronic kidney disease    on dialysis M-W-F  . Diabetes mellitus without complication ([place])    onset as adult  . Hypertension   . Thyroid disease    hyperparathyroidism         Past Surgical History:   Procedure Laterality Date  . AV FISTULA PLACEMENT Left 06/04/2014   Procedure: ARTERIOVENOUS (AV) FISTULA CREATION-  BRACHIOCEPHALIC WITH LIGATION OF COMPETEING BRANCH;  Surgeon: Mal Misty, MD;  Location: [facility];  Service: Vascular;  Laterality: Left;  . INSERTION OF DIALYSIS CATHETER  04/2014         Family History  Problem Relation Age of Onset  . Cancer - Lung Mother   . Cancer - Other Father     Social History:  Retired. Lives with sister--retired and in good health. Works part time for [employer]. He reports that he quit smoking about 32 years ago. He has never used smokeless tobacco. He reports that he drinks beer occasionally.  He reports that he does not use drugs.         Allergies  Allergen Reactions  . Penicillins Other (See Comments)    Has patient had a PCN reaction causing immediate rash, facial/tongue/throat swelling, SOB or lightheadedness with hypotension: Unk Has patient had a PCN reaction causing severe rash involving mucus membranes or skin necrosis: Unk Has patient had a PCN reaction that required hospitalization: Unk Has patient had a PCN reaction occurring within the last 10 years: Unk If all of the above answers are "NO", then may proceed with Cephalosporin use.          Medications Prior to Admission  Medication Sig Dispense Refill  . amLODipine (NORVASC) 10 MG tablet Take 10 mg by mouth daily.    Marland Kitchen b complex-vitamin c-folic acid (NEPHRO-VITE) 0.8 MG TABS tablet Take 1 tablet by mouth at bedtime.    Marland Kitchen  calcitRIOL (ROCALTROL) 0.5 MCG capsule Take 0.5 mcg by mouth daily.    . cinacalcet (SENSIPAR) 30 MG tablet Take 30 mg by mouth daily.    . hydrALAZINE (APRESOLINE) 100 MG tablet Take 100 mg by mouth 3 (three) times daily.    . Insulin NPH Human, Isophane, (NOVOLIN N [place]) Inject 4-19 Units into the skin 2 (two) times daily.     . insulin regular (NOVOLIN R,HUMULIN R) 100 units/mL injection Inject 2-6 Units into  the skin 2 (two) times daily before a meal.     . metoprolol (LOPRESSOR) 100 MG tablet Take 100 mg by mouth 2 (two) times daily.     Marland Kitchen oxyCODONE (ROXICODONE) 5 MG immediate release tablet Take 1 tablet (5 mg total) by mouth every 6 (six) hours as needed for severe pain. 10 tablet 0  . sevelamer carbonate (RENVELA) 800 MG tablet Take 800 mg by mouth 3 (three) times daily with meals.      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  Functional Status:  Mobility: Bed Mobility Overal bed mobility: Needs Assistance Bed Mobility: Supine to Sit Supine to sit: Mod assist, HOB elevated General bed mobility comments: Assist to initiate hips and to elevate trunk to get to EOB.  Transfers Overall transfer level: Needs assistance Equipment used: 2 person hand held assist Transfers: Sit to/from Stand, Stand Pivot Transfers Sit to Stand: Mod assist, +2 physical assistance Stand pivot transfers: Max assist General transfer comment: Assist of 2 to power to standing with cues for hip/knee extension and upright. Able to take a few steps to get to chair with Max A for balance, weigh shifting. pt with bil partial knee buckling and sitting prematurely being lowered into chair.      ADL: ADL Overall ADL's : Needs assistance/impaired Grooming: Moderate assistance, Sitting Grooming Details (indicate cue  type and reason): attempting to wipe with L UE but reports R handed. pt wiping briefly and terminating task.  Upper Body Bathing: Maximal assistance Lower Body Bathing: Total assistance Upper Body Dressing : Maximal assistance Lower Body Dressing: Total assistance Lower Body Dressing Details (indicate cue type and reason): don socks General ADL Comments: Pt requries incr time and effort to transfer to chair with bil LE blocked initially. pt iwth one buckle during transfer  Cognition: Cognition Overall Cognitive Status: Impaired/Different from baseline Orientation Level: Oriented to person, Oriented to place, Oriented to time, Disoriented to situation Cognition Arousal/Alertness: Lethargic Behavior During Therapy: Flat affect Overall Cognitive Status: Impaired/Different from baseline Area of Impairment: Problem solving, Attention Current Attention Level: Sustained Problem Solving: Slow processing, Decreased initiation, Requires verbal cues, Requires tactile cues General Comments: A&Ox4; difficult to understand at times due to muffled speech. Difficult to assess due to: Level of arousal   Blood pressure (!) 155/86, pulse 92, temperature 98.7 F (37.1 C), temperature source Oral, resp. rate (!) 33, height 5\' 10"  (1.778 m), weight 78.6 kg (173 lb 4.5 oz), SpO2 91 %. Physical Exam  Nursing note reviewed. Constitutional: He appears well-developed and well-nourished.  Flat affect. Very distracted  and slow to initiate.  HENT:  Head: Normocephalic.  White coating on tongue  Eyes: EOM are normal. Pupils are equal, round, and reactive to light.  Neck: Normal range of motion. Neck supple.  Cardiovascular: Regular rhythm.   Respiratory: Effort normal and breath sounds normal. No stridor. No respiratory distress.  GI: Soft. Bowel sounds are normal. He exhibits no distension. There is no tenderness.  Musculoskeletal: He exhibits no edema or tenderness.  Neurological: He is alert.  Delayed  processing and slow to initiate. Oriented to self and place. Situation "stroke" . Needed cues to recall DOB. Moves all 4's. UE grossly 3+/5 prox to 4/5 distal. LE: 3/5 HF 3+/5 KE and 4-/5 ADF/PF. Sensation grossly intact. DTR's 1+  Skin: Skin is warm and dry.  Psychiatric: His affect is blunt. He is slowed. Cognition and memory are impaired. He is inattentive.    Lab Results Last 24 Hours       Results for orders placed or performed during the hospital encounter of 11/24/16 (from the past 24 hour(s))  Hepatitis B surface antigen     Status: None   Collection Time: 11/28/16  4:36 PM  Result Value Ref Range   Hepatitis B Surface Ag Negative Negative  Hepatitis B surface antibody     Status: None   Collection Time: 11/28/16  4:38 PM  Result Value Ref Range   Hepatitis B-Post 14.3 Immunity>9.9 mIU/mL  Hepatitis B core antibody, total     Status: None   Collection Time: 11/28/16  4:38 PM  Result Value Ref Range   Hep B Core Total Ab Negative Negative  Glucose, capillary     Status: Abnormal   Collection Time: 11/28/16  8:39 PM  Result Value Ref Range   Glucose-Capillary 179 (H) 65 - 99 mg/dL  Comprehensive metabolic panel     Status: Abnormal   Collection Time: 11/29/16  4:49 AM  Result Value Ref Range   Sodium 137 135 - 145 mmol/L   Potassium 4.4 3.5 - 5.1 mmol/L   Chloride 100 (L) 101 - 111 mmol/L   CO2 27 22 - 32 mmol/L   Glucose, Bld 173 (H) 65 - 99 mg/dL   BUN 34 (H) 6 - 20 mg/dL   Creatinine, Ser 6.57 (H) 0.61 - 1.24 mg/dL   Calcium 8.1 (L) 8.9 - 10.3 mg/dL   Total Protein 5.9 (L) 6.5 - 8.1 g/dL   Albumin 2.0 (L) 3.5 - 5.0 g/dL   AST 22 15 - 41 U/L   ALT 15 (L) 17 - 63 U/L   Alkaline Phosphatase 52 38 - 126 U/L   Total Bilirubin 0.3 0.3 - 1.2 mg/dL   GFR calc non Af Amer 8 (L) >60 mL/min   GFR calc Af Amer 9 (L) >60 mL/min   Anion gap 10 5 - 15  Glucose, capillary     Status: Abnormal   Collection Time: 11/29/16  5:51 AM  Result Value Ref  Range   Glucose-Capillary 164 (H) 65 - 99 mg/dL  Glucose, capillary     Status: Abnormal   Collection Time: 11/29/16  8:18 AM  Result Value Ref Range   Glucose-Capillary 206 (H) 65 - 99 mg/dL   Comment 1 Notify RN    Comment 2 Document in Chart   Glucose, capillary     Status: Abnormal   Collection Time: 11/29/16 11:38 AM  Result Value Ref Range   Glucose-Capillary 102 (H) 65 - 99 mg/dL   Comment 1 Notify RN  Comment 2 Document in Chart      Imaging Results (Last 48 hours)  No results found.    Assessment/Plan: Diagnosis: debility and encephalopathy after seizure and other medical complications 1. Does the need for close, 24 hr/day medical supervision in concert with the patient's rehab needs make it unreasonable for this patient to be served in a less intensive setting? Yes 2. Co-Morbidities requiring supervision/potential complications: ESRD, htn, DM 3. Due to bladder management, bowel management, safety, skin/wound care, disease management, medication administration, pain management and patient education, does the patient require 24 hr/day rehab nursing? Yes 4. Does the patient require coordinated care of a physician, rehab nurse, PT (1-2 hrs/day, 5 days/week) and OT (1-2 hrs/day, 5 days/week) to address physical and functional deficits in the context of the above medical diagnosis(es)? Yes Addressing deficits in the following areas: balance, endurance, locomotion, strength, transferring, bowel/bladder control, bathing, dressing, feeding, grooming, toileting and psychosocial support 5. Can the patient actively participate in an intensive therapy program of at least 3 hrs of therapy per day at least 5 days per week? Yes 6. The potential for patient to make measurable gains while on inpatient rehab is excellent 7. Anticipated functional outcomes upon discharge from inpatient rehab are modified independent  with PT, modified independent with OT, n/a with  SLP. 8. Estimated rehab length of stay to reach the above functional goals is: 7-10 days 9. Does the patient have adequate social supports and living environment to accommodate these discharge functional goals? Yes 10. Anticipated D/C setting: Home 11. Anticipated post D/C treatments: HH therapy and Outpatient therapy 12. Overall Rehab/Functional Prognosis: excellent  RECOMMENDATIONS: This patient's condition is appropriate for continued rehabilitative care in the following setting: CIR Patient has agreed to participate in recommended program. Yes Note that insurance prior authorization may be required for reimbursement for recommended care.  Comment: Pt was independent and driving prior to admission. Continues to demonstrate ongoing weakness which impacts basic mobility and self-care. Should do well with inpatient rehab and meet modified independent goals. Rehab Admissions Coordinator to follow up.  Thanks,  [name], MD, [place] Pillar, PA-C 11/29/2016    Revision History                        Routing History

## 2016-12-01 NOTE — Progress Notes (Signed)
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   Objective Vital signs in last 24 hours: Vitals:   11/30/16 1900 11/30/16 2053 12/01/16 0446 12/01/16 0943  BP: (!) 150/78 (!) 160/92 (!) 153/76 (!) 182/87  Pulse: 90 93 90 96  Resp: 18 18 19 18   Temp: 98.7 F (37.1 C) 99.5 F (37.5 C) 98.5 F (36.9 C) 99.8 F (37.7 C)  TempSrc: Oral Oral Oral Oral  SpO2: 93% 94% 95% 93%  Weight:  75.8 kg (167 lb 1.7 oz)    Height:       Weight change: -1.9 kg (-4 lb 3 oz)  Physical Exam: General: alert Ox3 , AAM  Nad sitting in bedside chair  Heart:RRR Lungs: CTA Abdomen:  bs pos , Soft NT, ND Extremities: no pedal edema Dialysis Access: pos bruit LUA AVF   Problem/Plan: 1. Seizures new onset - on IV Keppra. Neurology following. Dosing to po not felt will need long term. Working dx for [facility] + focal defects => profound hyperglycemia vs hypertensive encephalopathy precipitated his seizure./ (per neuro).  Rehab seeing now  2. ESRD on [facility]> CHANGED TO HD  Now. TTS as OP ( prior [facility] ) Clip pt.  He will have to be substantially better neurologically in order to resume outpt [facility]CPD. Fortunately has a functional L AVFnutritionally compromised and with albumin now <2, will plan to switch over to HD for the time being anyway. HD today.   3. HTN/ Vol : Establishing  edw / HTn on admit  Slowly improving . on Amlodipine 10 mg PO q hs/hydralazine 100 TID and metoprolol 100 mg PO BID. Losartan 50 mg bid  added 4. Anemia HGB drifting down. 9.9/   48 % tfs / started  Aranesp.60mg  q tues   5. Uncont DM - severe hyperglycemia on admission. Better control now, off insulin drip.  6. 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 / po vit d change to q hd  / From qday/ po ca as binder  Ernest Haber, PA-C [facility] Beeper [phone] 12/01/2016,9:57 AM  LOS: 7  days   Labs: Basic Metabolic Panel:  Recent Labs Lab 11/26/16 0453 11/26/16 1643  11/28/16 0413 11/29/16 0449 11/30/16 0526  NA 135  --   < > 135 137 136  K 5.0  --   < > 4.4 4.4 4.4  CL 98*  --   < > 96* 100* 98*  CO2 26  --   < > 25 27 26   GLUCOSE 313*  --   < > 381* 173* 171*  BUN 80*  --   < > 67* 34* 48*  CREATININE 12.08*  --   < > 10.46* 6.57* 8.58*  CALCIUM 8.1*  --   < > 7.9* 8.1* 8.4*  PHOS 7.6* 7.9*  --   --   --  4.7*  < > = values in this interval not displayed. Liver Function Tests:  Recent Labs Lab 11/27/16 0822 11/28/16 0413 11/29/16 0449  AST 15 20 22   ALT 13* 13* 15*  ALKPHOS 50 49 52  BILITOT 0.5 0.5 0.3  PROT 5.4* 5.6* 5.9*  ALBUMIN 1.7* 1.9* 2.0*   No results for input(s): LIPASE, AMYLASE in the last 168 hours.  Recent Labs Lab 11/27/16 0931  AMMONIA 49*   CBC:  Recent Labs Lab 11/24/16 1758  11/25/16 0732 11/26/16 0453 11/27/16 0822 11/28/16 0413 11/30/16 0526  WBC 6.7  < > 8.8 9.5 8.6 8.0 5.6  NEUTROABS 3.3  --   --   --   --   --   --   HGB 13.6  < > 11.5* 11.2* 10.8* 10.4* 9.9*  HCT 43.0  < > 36.4* 34.2* 32.6* 32.4* 30.7*  MCV 86.9  < > 84.5 82.4 81.1 81.8 82.7  PLT 245  < > 188 151 173 197 197  < > = values in this interval not displayed. Cardiac Enzymes: No results for input(s): CKTOTAL, CKMB, CKMBINDEX, TROPONINI in the last 168 hours. CBG:  Recent Labs Lab 11/29/16 2112 11/30/16 1138 11/30/16 1752 11/30/16 2048 12/01/16 0809  GLUCAP 155* 158* 173* 166* 83     Medications:  . amLODipine  10 mg Oral Daily  . calcitRIOL  0.5 mcg Oral Daily  . calcium carbonate  400 mg of elemental calcium Oral TID WC  . darbepoetin (ARANESP) injection - DIALYSIS  60 mcg Intravenous Q Tue-HD  . feeding supplement (NEPRO CARB STEADY)  237 mL Oral BID BM  . gentamicin cream  1 application Topical Daily  . heparin  5,000 Units Subcutaneous Q8H  . hydrALAZINE  100 mg Oral TID  . insulin aspart  0-20 Units Subcutaneous TID WC  .  insulin aspart  0-5 Units Subcutaneous QHS  . insulin NPH Human  10 Units Subcutaneous BID AC & HS  . levETIRAcetam  500 mg Oral Daily  . losartan  50 mg Oral BID  . mouth rinse  15 mL Mouth Rinse BID  . metoprolol tartrate  100 mg Oral BID  . multivitamin  1 tablet Oral QHS  . sevelamer carbonate  800 mg Oral TID WC

## 2016-12-01 NOTE — Progress Notes (Signed)
Patient transferred to [phone] with all belongings in tow.

## 2016-12-02 ENCOUNTER — Inpatient Hospital Stay (HOSPITAL_COMMUNITY): Payer: Medicare Other | Admitting: *Deleted

## 2016-12-02 ENCOUNTER — Inpatient Hospital Stay (HOSPITAL_COMMUNITY): Payer: Medicare Other | Admitting: Occupational Therapy

## 2016-12-02 DIAGNOSIS — R5381 Other malaise: Principal | ICD-10-CM

## 2016-12-02 LAB — GLUCOSE, CAPILLARY
GLUCOSE-CAPILLARY: 74 mg/dL (ref 65–99)
GLUCOSE-CAPILLARY: 84 mg/dL (ref 65–99)
GLUCOSE-CAPILLARY: 179 mg/dL — AB (ref 65–99)
Glucose-Capillary: 127 mg/dL — ABNORMAL HIGH (ref 65–99)
Glucose-Capillary: 200 mg/dL — ABNORMAL HIGH (ref 65–99)

## 2016-12-02 NOTE — Progress Notes (Signed)
Physical Therapy Session Note  Patient Details  Name: Rishard Delange MRN: [id number] Date of Birth: 1956/09/05  Today's Date: 12/02/2016 PT Individual Time: 1445-1530 PT Individual Time Calculation (min): 45 min      Skilled Therapeutic Interventions/Progress Updates: Patient resting in bed with sister present in room, agrees to therapy session, reports slight improvement in abdominal pain from earlier this day. No other pain complains. Patient performed transfer to sitting EOB with Supervision and to stand with min A with RW in front. Training in gait to therapy gym with RW and min A from therapist, w/c follow. Verbal cues for posture, decrease of shoulder shrug and step length.  NuStep x 12 min at resistance level of 4 in order to facilitate reciprocal movement, muscle strength and activity tolerance.Cued patient maintain SPM >45.  Therapeutic exercises to increase B LE strength in supine: bridging 2 x 20 ,SLR 2 x 20, in sitting: LAQ 2 x20 with blue thera-band resistance , hamstring curls 2 x 20 with thera-band resistance.  Patient walked back to room with RW with decreased need for assistance and (still min but no LOB noted) , returned to supine in bed ,all needs within reach, alarm on and sister present.   Therapy Documentation Precautions:  Precautions Precautions: Fall Precaution Comments: peritoneal access abdomen Restrictions Weight Bearing Restrictions: No :     See Function Navigator for Current Functional Status.   Therapy/Group: Individual Therapy  Guadlupe Spanish 12/02/2016, 3:43 PM

## 2016-12-02 NOTE — Progress Notes (Signed)
Subjective/Complaints:   Objective: Vital Signs: Blood pressure (!) 165/94, pulse 82, temperature 99.9 F (37.7 C), temperature source Oral, resp. rate 18, weight 80.5 kg (177 lb 7.5 oz), SpO2 95 %. Ir US Guide Vasc Access Left  Result Date: 11/30/2016 INDICATION: 61 year old with end-stage renal disease. Patient has a left upper arm brachiocephalic fistula with poor flows and pulling clots. EXAM: AV FISTULA DECLOT WITH THROMBOLYSIS, MECHANICAL THROMBECTOMY, BALLOON ANGIOPLASTY AND STENT PLACEMENT ULTRASOUND GUIDANCE FOR VASCULAR ACCESS Physician: Stephan Minister. Anselm Pancoast, MD MEDICATIONS: None. ANESTHESIA/SEDATION: Heparin 5000 units, TPA 2 mg, 100 mcg fentanyl Patient was monitored by a radiology nurse throughout the procedure. FLUOROSCOPY TIME:  Fluoroscopy Time: 18 minutes12 seconds (42 mGy). COMPLICATIONS: None immediate. PROCEDURE: The procedure was explained to the patient. The risks and benefits of the procedure were discussed and the patient's questions were addressed. Informed consent was obtained from the patient. The left AV fistula was initially patent. Fistula was punctured with an Angiocath and a series of fistulogram images were obtained. Initially, we planned to perform only balloon angioplasty and possible stent placement. The left arm was prepped and draped in sterile fashion. Maximal barrier sterile technique was utilized including caps, mask, sterile gowns, sterile gloves, sterile drape, hand hygiene and skin antiseptic. The skin around the catheter was anesthetized with 1% lidocaine. The Angiocath was exchanged for a 6 [place] vascular sheath was some difficulty. Eventually, 6 French sheath was advanced over a stiff Amplatz wire. Following placement of the 6 French vascular sheath, there was thrombosis of the outflow vein from the elbow to the upper humerus region. At this point, the procedure was converted to a declot procedure. A suitable area for a second venous access was identified in the  upper arm using ultrasound. Skin was anesthetized with 1% lidocaine. A 21 gauge needle was directed into the cephalic vein with ultrasound guidance and a micropuncture dilator set was placed and directed towards the arterial anastomosis. Patient was given IV heparin. 2 mg of tPA was injected along the occluded outflow vein in the left upper arm. The outflow vein was treated with the Angiojet thrombectomy device. The areas of known stenosis were angioplastied with a 6 mm x 40 mm Mustang balloon. The micropuncture dilator pointing towards the anastomosis was upsized to a 6 [place] vascular sheath. Eventually, a 5 [place] Kumpe the catheter was advanced across the arterial anastomosis with a Glidewire. It was very difficult to advance a wire beyond the clot. Subsequently, the arterial plug was pulled using an over-the-wire Fogarty balloon. After the arterial plug was removed, there was flow throughout the fistula. Follow-up fistulogram images demonstrated recurrent areas of venous stenosis which were again treated with the 6 mm angioplasty balloon. Additional angiogram images demonstrated stenosis the vein just beyond the anastomosis. Therefore, a 5 mm x 20 mm Mustang balloon was used to angioplasty this segment. Follow-up angiogram images were obtained. Follow up angiogram images again demonstrated recurrent stenosis in the upper arm cephalic vein. This area was treated again with the 6 mm balloon with minimal improvement. Therefore, this area was treated with an 8 mm x 40 mm self expanding Smart Flex stent. Stent was successfully placed across the stenosis and dilated with a 8 mm x 40 mm balloon. Follow-up angiogram images were obtained. Both vascular sheaths were removed with pursestring sutures. FINDINGS: Fistulogram images demonstrated multiple areas of stenosis involving the left upper arm cephalic vein. There was a moderate-to-severe stenosis in the mid humeral region. At least 2 other areas of stenosis  in the  lower humeral region. One of the lower humeral stenoses was near the anastomosis. Following placement of the vascular sheath, there was thrombosis of the outflow vein. After flow was re-established within the fistula and outflow vein, there was recurrent stenosis in the mid humerus despite multiple angioplasties. This area of recurrent stenosis may have been the cause of the initial problems and thrombosis. Therefore, this area was stented. Post stent angiogram demonstrated that the stented segment was patent. Mild narrowing in the midportion of stent. Areas of narrowing proximal to the stent are compatible with vasospasm. IMPRESSION: Successful declot of the left upper arm AV fistula. Persistent area of stenosis in the left cephalic vein was treated with a stent. Electronically Signed   By: Markus Daft M.D.   On: 11/30/2016 18:14   Ir Thrombectomy Av Fistula W/thrombolysis/pta/stent Inc Shunt Img Lt  Result Date: 11/30/2016 INDICATION: 61 year old with end-stage renal disease. Patient has a left upper arm brachiocephalic fistula with poor flows and pulling clots. EXAM: AV FISTULA DECLOT WITH THROMBOLYSIS, MECHANICAL THROMBECTOMY, BALLOON ANGIOPLASTY AND STENT PLACEMENT ULTRASOUND GUIDANCE FOR VASCULAR ACCESS Physician: Stephan Minister. Anselm Pancoast, MD MEDICATIONS: None. ANESTHESIA/SEDATION: Heparin 5000 units, TPA 2 mg, 100 mcg fentanyl Patient was monitored by a radiology nurse throughout the procedure. FLUOROSCOPY TIME:  Fluoroscopy Time: 18 minutes12 seconds (42 mGy). COMPLICATIONS: None immediate. PROCEDURE: The procedure was explained to the patient. The risks and benefits of the procedure were discussed and the patient's questions were addressed. Informed consent was obtained from the patient. The left AV fistula was initially patent. Fistula was punctured with an Angiocath and a series of fistulogram images were obtained. Initially, we planned to perform only balloon angioplasty and possible stent placement. The left arm  was prepped and draped in sterile fashion. Maximal barrier sterile technique was utilized including caps, mask, sterile gowns, sterile gloves, sterile drape, hand hygiene and skin antiseptic. The skin around the catheter was anesthetized with 1% lidocaine. The Angiocath was exchanged for a 6 [place] vascular sheath was some difficulty. Eventually, 6 French sheath was advanced over a stiff Amplatz wire. Following placement of the 6 French vascular sheath, there was thrombosis of the outflow vein from the elbow to the upper humerus region. At this point, the procedure was converted to a declot procedure. A suitable area for a second venous access was identified in the upper arm using ultrasound. Skin was anesthetized with 1% lidocaine. A 21 gauge needle was directed into the cephalic vein with ultrasound guidance and a micropuncture dilator set was placed and directed towards the arterial anastomosis. Patient was given IV heparin. 2 mg of tPA was injected along the occluded outflow vein in the left upper arm. The outflow vein was treated with the Angiojet thrombectomy device. The areas of known stenosis were angioplastied with a 6 mm x 40 mm Mustang balloon. The micropuncture dilator pointing towards the anastomosis was upsized to a 6 [place] vascular sheath. Eventually, a 5 [place] Kumpe the catheter was advanced across the arterial anastomosis with a Glidewire. It was very difficult to advance a wire beyond the clot. Subsequently, the arterial plug was pulled using an over-the-wire Fogarty balloon. After the arterial plug was removed, there was flow throughout the fistula. Follow-up fistulogram images demonstrated recurrent areas of venous stenosis which were again treated with the 6 mm angioplasty balloon. Additional angiogram images demonstrated stenosis the vein just beyond the anastomosis. Therefore, a 5 mm x 20 mm Mustang balloon was used to angioplasty this segment. Follow-up angiogram images  were obtained.  Follow up angiogram images again demonstrated recurrent stenosis in the upper arm cephalic vein. This area was treated again with the 6 mm balloon with minimal improvement. Therefore, this area was treated with an 8 mm x 40 mm self expanding Smart Flex stent. Stent was successfully placed across the stenosis and dilated with a 8 mm x 40 mm balloon. Follow-up angiogram images were obtained. Both vascular sheaths were removed with pursestring sutures. FINDINGS: Fistulogram images demonstrated multiple areas of stenosis involving the left upper arm cephalic vein. There was a moderate-to-severe stenosis in the mid humeral region. At least 2 other areas of stenosis in the lower humeral region. One of the lower humeral stenoses was near the anastomosis. Following placement of the vascular sheath, there was thrombosis of the outflow vein. After flow was re-established within the fistula and outflow vein, there was recurrent stenosis in the mid humerus despite multiple angioplasties. This area of recurrent stenosis may have been the cause of the initial problems and thrombosis. Therefore, this area was stented. Post stent angiogram demonstrated that the stented segment was patent. Mild narrowing in the midportion of stent. Areas of narrowing proximal to the stent are compatible with vasospasm. IMPRESSION: Successful declot of the left upper arm AV fistula. Persistent area of stenosis in the left cephalic vein was treated with a stent. Electronically Signed   By: Markus Daft M.D.   On: 11/30/2016 18:14   Results for orders placed or performed during the hospital encounter of 12/01/16 (from the past 72 hour(s))  Glucose, capillary     Status: Abnormal   Collection Time: 12/01/16  9:07 PM  Result Value Ref Range   Glucose-Capillary 171 (H) 65 - 99 mg/dL  Glucose, capillary     Status: None   Collection Time: 12/02/16  3:04 AM  Result Value Ref Range   Glucose-Capillary 74 65 - 99 mg/dL  Glucose, capillary     Status:  Abnormal   Collection Time: 12/02/16  6:38 AM  Result Value Ref Range   Glucose-Capillary 127 (H) 65 - 99 mg/dL     HEENT: normal Cardio: RRR Resp: CTA B/L and unlabored GI: BS positive and NT, ND Extremity:  Pulses positive and No Edema Skin:   Wound C/D/I and Left arm incisions dressed  Neuro: Lethargic, Abnormal Motor 4/5 in BUE and BLE and Other somnolent just woke up Musc/Skel:  Other no pain with UE or LE ROM Gen NAD   Assessment/Plan: 1. Functional deficits secondary to debility due to multiple medical issues which require 3+ hours per day of interdisciplinary therapy in a comprehensive inpatient rehab setting. Physiatrist is providing close team supervision and 24 hour management of active medical problems listed below. Physiatrist and rehab team continue to assess barriers to discharge/monitor patient progress toward functional and medical goals. FIM:                                  Medical Problem List and Plan: 1. Functional and mobility deficits secondary to seizure and debility related to multiple medical issues -CIR PT, OT, SLP 2. DVT Prophylaxis/Anticoagulation: Pharmaceutical: Heparin 3. Pain Management: N/a 4. Mood: LCSW to follow for evaluation and support.  5. Neuropsych: This patient is not fully capable of making decisions on hisown behalf. 6. Skin/Wound Care: Maintain adequate nutrition and hydration status. Routine pressure relief measures.  7. Fluids/Electrolytes/Nutrition: Monitor I/O. Now on renal diet with 1200 cc FR.  -  lab work with HD 8. 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 No driving till follow up EEG /evaluation by neurology on outpatient basis.  9. ESRD: Has has been switched to HD on TTS pending neurologic recovery. Hope to transition to PD eventually in outpatient setting--patient upset and insistent that he will not be doing HD at discharge. RD to educate on  renal diet. -HD to be scheduled after therapy day to accommodate rehab schedule 10 T2DM: Takes NPH 20 units bid with 25 units bid. BS continue to be poorly controlled will monitor BS 4-5 X day and adjust as needed. Has not seen primary MD X 3 years.  Cont current regimen CBG (last 3)   Recent Labs  12/01/16 2107 12/02/16 0304 12/02/16 0638  GLUCAP 171* 74 127*    11. HTN: Monitor BP bid--on amlodipine, hydralazine, metoprolol and losartan. Medications continue to be adjusted for tighter control.  Vitals:   12/01/16 2045 12/02/16 0615  BP: (!) 158/85 (!) 165/94  Pulse: 86 82  Resp:  18  Temp:  99.9 F (37.7 C)   12. Anemia of chronic disease: H/H monitored with HD. On aranesp for supplement. .   LOS (Days) 1 A FACE TO FACE EVALUATION WAS PERFORMED  KIRSTEINS,ANDREW E 12/02/2016, 7:29 AM

## 2016-12-02 NOTE — Evaluation (Addendum)
Occupational Therapy Assessment and Plan  Patient Details  Name: Jared [name] MRN: [id number] Date of Birth: 14-Dec-1955  OT Diagnosis: abnormal posture and muscle weakness (generalized) Rehab Potential: Rehab Potential (ACUTE ONLY): Good ELOS: 8-10 days   Today's Date: 12/02/2016 OT Individual Time: 0270-0432 and 9710-2468 OT Individual Time Calculation (min): 29 min and 62 min     Problem List:  Patient Active Problem List   Diagnosis Date Noted  . Encephalopathy 12/01/2016  . Hypertension   . CKD (chronic kidney disease) stage V requiring chronic dialysis (HCC)   . Acute respiratory failure with hypoxia (HCC)   . Seizure (HCC) 11/24/2016  . Status epilepticus [facility])     Past Medical History:  Past Medical History:  Diagnosis Date  . Anemia   . Chronic kidney disease    on dialysis M-W-F  . Diabetes mellitus without complication (HCC)    onset as adult  . Hypertension   . Thyroid disease    hyperparathyroidism   Past Surgical History:  Past Surgical History:  Procedure Laterality Date  . AV FISTULA PLACEMENT Left 06/04/2014   Procedure: ARTERIOVENOUS (AV) FISTULA CREATION-  BRACHIOCEPHALIC WITH LIGATION OF COMPETEING BRANCH;  Surgeon: Pryor Ochoa, MD;  Location: [facility] OR;  Service: Vascular;  Laterality: Left;  . INSERTION OF DIALYSIS CATHETER  04/2014  . IR GENERIC HISTORICAL Left 11/30/2016   IR THROMBECTOMY AV FISTULA W/THROMBOLYSIS/PTA/STENT INC/SHUNT/IMG LT 11/30/2016 Richarda Overlie, MD [facility]-INTERV RAD  . 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 He was intubated for airway protection and was loaded with keppra. CTA head/neck negative for large vessel occlusion. MRI brain negative for  acute changes. Seizures felt to be due to hyperglycemia and likely contributing to RUE weakness. Dr. Amada Jupiter recommends continuing Keppra for total of 7 days. Dose decreased due to sedation and delirium resolving. BP control improving with addition of Losartan and off insulin drip. Transitioned to HD and requried L-AV fistula thrombolysis by radiology on 01/4. Therapy evaluations done revealing generalized weakness with delayed processing and muffled speech. PM&R was consulted and felt that he could benefit from an inpatient rehab admission  Patient currently requires min-Mod A with basic self-care skills secondary to muscle weakness, decreased cardiorespiratoy endurance and decreased standing balance.  Prior to hospitalization, patient could complete BADLs with independent .  Patient will benefit from skilled intervention to increase independence with basic self-care skills prior to discharge home with care partner.  Anticipate patient will require intermittent supervision and follow up home health.  OT - End of Session Endurance Deficit: Yes OT Assessment Rehab Potential (ACUTE ONLY): Good Barriers to Discharge: Inaccessible home environment OT Patient demonstrates impairments in the following area(s): Balance;Endurance;Motor;Safety OT Basic ADL's Functional Problem(s): Grooming;Bathing;Dressing;Toileting OT Advanced ADL's Functional Problem(s): Simple Meal Preparation;Light Housekeeping OT Transfers Functional Problem(s): Toilet;Tub/Shower OT Additional Impairment(s): None OT Plan OT Intensity: Minimum of 1-2 x/day, 45 to 90 minutes OT Frequency: 5 out of 7 days OT Duration/Estimated Length of Stay: 8-10 days OT Treatment/Interventions: Discharge planning;Self Care/advanced ADL retraining;Therapeutic Activities;UE/LE Coordination activities;Functional mobility training;Patient/family education;Therapeutic Exercise;DME/adaptive equipment instruction;Psychosocial support;UE/LE Strength  taining/ROM OT Self Feeding Anticipated Outcome(s): N/A OT Basic Self-Care Anticipated Outcome(s): Mod I  OT Toileting Anticipated Outcome(s): Mod I  OT  Bathroom Transfers Anticipated Outcome(s): Mod I  OT Recommendation Recommendations for Other Services: Speech consult Patient destination: Home Follow Up Recommendations: Home health OT Equipment Recommended: To be determined   Skilled Therapeutic Intervention Skilled OT session completed with focus on initial evaluation, education on CIR, and establishment of client-centered goals. Pt was lying in bed at time of arrival, agreeable to tx. BP taken at EOB with pt requiring unilateral support on bed to maintain sitting balance. 165/95. Pt asymptomatic with RN reporting to continue with tx per pts tolerance. Stand pivot transfer<w/c with RW completed with Mod A.. Bathing/dressing completed at sink with overall Mod assist for standing balance. Bilateral LE buckling noted when standing with 1 posterior LOB and therapist correction. Pt cued for anterior weight shifting due to posterior lean in standing. Min cues for sequencing throughout due to slight memory/attention deficits. Oral care/grooming completed w/c level at sink. Pt was then educated on use of call bell with instruction to notify nursing with functional needs outside of therapy. Pt verbalized understanding. At end of session pt was set up with breakfast and left with all needs within reach.   2nd Session 1:1 Tx (29 min) Skilled OT session was completed with focus on toilet and tub bench transfers and d/c planning. Pt was lying in bed at time of arrival, agreeable to tx. Pt completed stand pivot transfer from bed<w/c<low toilet  with Mod A. Discussed pts home low toilet seat and option for elevated toilet seat at discharge depending on pts progress. Pt completed transfer ambulating into bathroom with RW and Mod A also. At this time, due to pts LEs buckling when ambulating, stand pivot was  recorded on safety plan for nursing staff. Pt educated on this. Pt ambulated with RW to complete shower transfer to tub bench. Pt reports having walk in shower on 1st and 2nd floor. Able to accommodate tub bench or shower chair. Pt has neither and discussed possible DME options at time of discharge. At end of session pt was left in w/c with all needs within reach and family present.   OT Evaluation Precautions/Restrictions  Precautions Precautions: Fall Precaution Comments: peritoneal access abdomen Restrictions Weight Bearing Restrictions: No General Chart Reviewed: Yes Family/Caregiver Present: No Vital Signs   Pain No c/o pain during sessions    Home Living/Prior Functioning Home Living Available Help at Discharge: Family, Available PRN/intermittently Type of Home: House Home Access: Stairs to enter [employer] of Steps:  (15 per pt report) Entrance Stairs-Rails: Right Home Layout: Two level, Full bath on main level, Bed/bath upstairs Alternate Level Stairs-Number of Steps: 1 flight Alternate Level Stairs-Rails: Right Bathroom Shower/Tub: Multimedia programmer: Standard Bathroom Accessibility: Yes  Lives With: Family (Sister) IADL History Homemaking Responsibilities: Yes Meal Prep Responsibility: Secondary Laundry Responsibility: Secondary Cleaning Responsibility: Secondary Current License: Yes Mode of Transportation: Car Occupation: Retired 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  ADL ADL ADL Comments: Please see functional navigator for ADL status Vision/Perception  Vision- History Baseline Vision/History: Wears glasses Wears Glasses: Reading only Patient Visual  Report: No change from baseline Vision- Assessment Vision Assessment?: Yes Eye Alignment: Within Functional Limits Ocular Range of Motion: Within Functional Limits Alignment/Gaze Preference: Within Defined Limits Tracking/Visual Pursuits: Able to track stimulus in all quads without difficulty  Cognition Overall Cognitive Status: Within Functional Limits for tasks assessed Arousal/Alertness: Awake/alert Orientation Level: Person;Place;Situation Person: Oriented Place: Oriented Situation: Oriented Year: 2018 Month: January Day of Week: Correct Memory: Appears intact Immediate Memory Recall: Sock;Blue;Bed Memory Recall: Sock;Blue;Bed Memory Recall Sock: Without Cue Memory Recall Blue: Without Cue Memory Recall Bed: Without Cue Attention: Focused;Sustained Focused Attention: Impaired Sustained Attention: Appears intact Safety/Judgment: Appears intact Sensation Sensation Light Touch: Appears Intact Stereognosis: Not tested Hot/Cold: Appears Intact Proprioception: Appears Intact Coordination Gross Motor Movements are Fluid and Coordinated: No Fine Motor Movements are Fluid and Coordinated: Yes Coordination and Movement Description: Impaired secondary to LE weakness Finger Nose Finger Test: Intact  Motor  Motor Motor: Other (comment) Motor - Skilled Clinical Observations: Affected by global weakness and decreased cardiorespiratory endurance Mobility  Transfers Transfers: Sit to Stand;Stand to Sit Sit to Stand: 4: Min assist Stand to Sit: 4: Min assist  Trunk/Postural Assessment  Cervical Assessment Cervical Assessment: Within Functional Limits Thoracic Assessment Thoracic Assessment: Exceptions to [facility] (forward flexed) Lumbar Assessment Lumbar Assessment: Exceptions to [facility] (posterior pelvic tilt) Postural Control Postural Control: Deficits on evaluation  Balance Balance Balance Assessed: Yes Extremity/Trunk Assessment RUE Assessment RUE Assessment: Exceptions to  WFL (4+/5 MMT grossly) LUE Assessment LUE Assessment: Exceptions to [facility] (4-/5 MMT grossly)   See Function Navigator for Current Functional Status.   Refer to Care Plan for Long Term Goals  Recommendations for other services: Other: Speech Therapy   Discharge Criteria: Patient will be discharged from OT if patient refuses treatment 3 consecutive times without medical reason, if treatment goals not met, if there is a change in medical status, if patient makes no progress towards goals or if patient is discharged from hospital.  The above assessment, treatment plan, treatment alternatives and goals were discussed and mutually agreed upon: by patient  Skeet Simmer 12/02/2016, 7:11 PM

## 2016-12-02 NOTE — Evaluation (Signed)
Physical Therapy Assessment and Plan  Patient Details  Name: Jared [name] MRN: [id number] Date of Birth: [birth year]  PT Diagnosis: Muscle weakness, Difficulty walking,  Rehab Potential: Excellent ELOS: 7-10 days   Today's Date: 12/02/2016 PT Individual Time: 1000-1100 PT Individual Time Calculation (min): 60 min     Problem List:  Patient Active Problem List   Diagnosis Date Noted  . Encephalopathy 12/01/2016  . Hypertension   . CKD (chronic kidney disease) stage V requiring chronic dialysis (HCC)   . Acute respiratory failure with hypoxia (HCC)   . Seizure (HCC) 11/24/2016  . Status epilepticus [facility])     Past Medical History:  Past Medical History:  Diagnosis Date  . Anemia   . Chronic kidney disease    on dialysis M-W-F  . Diabetes mellitus without complication (HCC)    onset as adult  . Hypertension   . Thyroid disease    hyperparathyroidism   Past Surgical History:  Past Surgical History:  Procedure Laterality Date  . AV FISTULA PLACEMENT Left 06/04/2014   Procedure: ARTERIOVENOUS (AV) FISTULA CREATION-  BRACHIOCEPHALIC WITH LIGATION OF COMPETEING BRANCH;  Surgeon: Pryor Ochoa, MD;  Location: [facility] OR;  Service: Vascular;  Laterality: Left;  . INSERTION OF DIALYSIS CATHETER  04/2014  . IR GENERIC HISTORICAL Left 11/30/2016   IR THROMBECTOMY AV FISTULA W/THROMBOLYSIS/PTA/STENT INC/SHUNT/IMG LT 11/30/2016 Richarda Overlie, MD [facility]  . 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 He was intubated for airway protection and was loaded with keppra. CTA head/neck negative for large vessel occlusion. MRI brain negative for acute changes. Seizures felt to be due to hyperglycemia and likely  contributing to RUE weakness. Dr. Amada Jupiter recommends continuing Keppra. Dose decreased due to sedation and delirium resolving. BP control improving with addition of Losartan and off insulin drip. Transitioned to HD and requried L-AV fistula thrombolysis by radiology on 01/4. Therapy evaluations done revealing generalized weakness with delayed processing and muffled speech. Patient admitted to IP Rehab 11/30/16.    Patient currently requires min with mobility secondary to muscle weakness.  Prior to hospitalization, patient was independent  with mobility and lived with   in a House home.  Home access is flightStairs to enter.  Patient will benefit from skilled PT intervention to maximize safe functional mobility for planned discharge home with 24 hour supervision.  Anticipate patient will benefit from follow up HH at discharge.  PT - End of Session Activity Tolerance: Tolerates 30+ min activity with multiple rests Endurance Deficit: Yes PT Assessment Rehab Potential (ACUTE/IP ONLY): Excellent Barriers to Discharge: Inaccessible home environment Barriers to Discharge Comments: Stairs to enter, limited asssistance from family PT Patient demonstrates impairments in the following area(s): Balance;Endurance;Motor;Safety PT Transfers Functional Problem(s): Bed to Chair;Car PT Locomotion Functional Problem(s): Ambulation;Stairs PT Plan PT Frequency: Total of 15 hours over 7 days of combined therapies PT Duration Estimated Length of Stay: 7-10 days PT Treatment/Interventions: Ambulation/gait training;Discharge planning;Patient/family education;Functional mobility training;Therapeutic Exercise;UE/LE Coordination activities;Therapeutic Activities;Neuromuscular re-education;Stair training;UE/LE Strength taining/ROM PT Transfers Anticipated Outcome(s): Mod I  PT Locomotion Anticipated Outcome(s): Mod I  PT Recommendation Recommendations for Other Services: Speech consult Follow Up Recommendations: Home  health PT Patient destination: Home Equipment Recommended: To be determined  Skilled Therapeutic  Intervention Evaluation and initial treatment completed. Bed mobility with Supervision , coming to sit with min A, good sitting balance, but patient complains of abdominal discomfort and does not like to stay in seated position . Sit to stand with RW with min A, able to maintain standing for 2 min ,but than B knee buckle. Able to recover and regain standing. Gait Training with RW x 100 feet with min A and cues for step length and posture. Stair negotiation with mod A 4 up and down with B rails. Car transfer with min A. BERG balance scale 20/56 Patient returned to room, transfer to bed, all needs within reach , alarm on .    PT Evaluation Precautions/Restrictions Precautions Precautions: Fall Precaution Comments: peritoneal access abdomen Restrictions Weight Bearing Restrictions: No General Chart Reviewed: Yes Response to Previous Treatment: Patient with no complaints from previous session. Family/Caregiver Present: No Vital SignsTherapy Vitals Pulse Rate: 85 BP: (!) 160/90 Pain Pain Assessment Pain Assessment: No/denies pain Home Living/Prior Functioning Home Living Available Help at Discharge: Family;Available PRN/intermittently Type of Home: House Home Access: Stairs to enter [employer] of Steps: flight Entrance Stairs-Rails: Right Home Layout: Two level;Bed/bath upstairs Alternate Level Stairs-Number of Steps: 1 flight Alternate Level Stairs-Rails: Right Bathroom Shower/Tub: Health visitor: Standard Bathroom Accessibility: Yes Prior Function Level of Independence: Independent with basic ADLs;Independent with gait  Able to Take Stairs?: Reciprically Driving: Yes 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   Cognition Overall Cognitive Status: Within Functional Limits for tasks assessed Orientation Level: Oriented  X4 Sensation Sensation Light Touch: Appears Intact Stereognosis: Appears Intact Hot/Cold: Appears Intact Proprioception: Appears Intact  Balance Balance Balance Assessed: Yes Standardized Balance Assessment Standardized Balance Assessment: Berg Balance Test Berg Balance Test Sit to Stand: Needs minimal aid to stand or to stabilize Standing Unsupported: Needs several tries to stand 30 seconds unsupported Sitting with Back Unsupported but Feet Supported on Floor or Stool: Able to sit safely and securely 2 minutes Stand to Sit: Uses backs of legs against chair to control descent Transfers: Needs one person to assist Standing Unsupported with Eyes Closed: Able to stand 3 seconds Standing Ubsupported with Feet Together: Needs help to attain position but able to stand for 30 seconds with feet together From Standing, Reach Forward with Outstretched Arm: Can reach forward >5 cm safely (2") From Standing Position, Pick up Object from Floor: Unable to try/needs assist to keep balance From Standing Position, Turn to Look Behind Over each Shoulder: Turn sideways only but maintains balance Turn 360 Degrees: Needs close supervision or verbal cueing Standing Unsupported, Alternately Place Feet on Step/Stool: Able to complete >2 steps/needs minimal assist Standing Unsupported, One Foot in Front: Needs help to step but can hold 15 seconds Standing on One Leg: Tries to lift leg/unable to hold 3 seconds but remains standing independently Total Score: 20 Extremity Assessment      RLE Assessment RLE Assessment: Exceptions to [facility] RLE Strength RLE Overall Strength: Deficits LLE Assessment LLE Assessment: Exceptions to [facility] LLE Strength LLE Overall Strength: Deficits   See Function Navigator for Current Functional Status.   Refer to Care Plan for Long Term Goals  Recommendations for other services: None   Discharge Criteria: Patient will be discharged from PT if patient refuses treatment 3  consecutive times without medical reason, if treatment goals not met, if there is a change in medical status, if patient makes no progress towards goals or if patient is discharged from hospital.  The above  assessment, treatment plan, treatment alternatives and goals were discussed and mutually agreed upon: by patient  Guadlupe Spanish 12/02/2016, 12:25 PM

## 2016-12-02 NOTE — Progress Notes (Signed)
Subjective:  In CIR, doing well.  Objective Vital signs in last 24 hours: Vitals:   12/01/16 1500 12/01/16 2045 12/02/16 0615 12/02/16 0852  BP: (!) 157/80 (!) 158/85 (!) 165/94 (!) 160/90  Pulse: 88 86 82 85  Resp: 17  18   Temp: 99.7 F (37.6 C)  99.9 F (37.7 C)   TempSrc: Oral  Oral   SpO2: 90%  95%   Weight: 80.1 kg (176 lb 9.4 oz)  80.5 kg (177 lb 7.5 oz)    Weight change:   Physical Exam: General: alert Ox3 , AAM  Lying in bed  Heart:RRR Lungs: CTA Abdomen:  bs pos , Soft NT, ND Extremities: no pedal edema Dialysis Access: pos bruit LUA AVF   Problem/Plan: 1. Seizures new onset - on IV Keppra. Neurology following. Dosing to po not felt will need long term. Working dx for [facility] + focal defects => profound hyperglycemia vs hypertensive encephalopathy precipitated his seizure./ (per neuro).  2. ESRD on CCPD> on HD at least for temporary time after PD failure.  Has been clipped to [facility] MWF AM shift.   Using AVF. PD cath in place. 3. HTN/ Vol : Establishing  edw / HTn on admit  Slowly improving . on Amlodipine 10 mg PO q hs/hydralazine 100 TID and metoprolol 100 mg PO BID. Losartan 50 mg bid  added 4. Anemia .60mg  q tues   5. Uncont DM - severe hyperglycemia on admission. Better control now, 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 / po vit d change to q hd  / From qday/ po ca as binder  Pearson Grippe MD 12/02/2016,9:56 AM  LOS: 1 day   Labs: Basic Metabolic Panel:  Recent Labs Lab 11/26/16 0453 11/26/16 1643  11/28/16 0413 11/29/16 0449 11/30/16 0526  NA 135  --   < > 135 137 136  K 5.0  --   < > 4.4 4.4 4.4  CL 98*  --   < > 96* 100* 98*  CO2 26  --   < > 25 27 26   GLUCOSE 313*  --   < > 381* 173* 171*  BUN 80*  --   < > 67* 34* 48*  CREATININE 12.08*  --   < > 10.46* 6.57* 8.58*  CALCIUM 8.1*  --   < > 7.9* 8.1* 8.4*  PHOS 7.6* 7.9*  --   --   --  4.7*  < > = values in this interval not displayed. Liver Function Tests:  Recent Labs Lab 11/27/16 0822 11/28/16 0413 11/29/16 0449   AST 15 20 22   ALT 13* 13* 15*  ALKPHOS 50 49 52  BILITOT 0.5 0.5 0.3  PROT 5.4* 5.6* 5.9*  ALBUMIN 1.7* 1.9* 2.0*   No results for input(s): LIPASE, AMYLASE in the last 168 hours.  Recent Labs Lab 11/27/16 0931  AMMONIA 49*   CBC:  Recent Labs Lab 11/26/16 0453 11/27/16 0822 11/28/16 0413 11/30/16 0526  WBC 9.5 8.6 8.0 5.6  HGB 11.2* 10.8* 10.4* 9.9*  HCT 34.2* 32.6* 32.4* 30.7*  MCV 82.4 81.1 81.8 82.7  PLT 151 173 197 197   Cardiac Enzymes: No results for input(s): CKTOTAL, CKMB, CKMBINDEX, TROPONINI in the last 168 hours. CBG:  Recent Labs Lab 12/01/16 0809 12/01/16 1219 12/01/16 2107 12/02/16 0304 12/02/16 0638  GLUCAP 83 122* 171* 74 127*     Medications:  . amLODipine  10 mg Oral Daily  . calcitRIOL  0.5 mcg Oral Daily  . calcium carbonate  400  mg of elemental calcium Oral TID WC  . [START ON 12/05/2016] darbepoetin (ARANESP) injection - DIALYSIS  60 mcg Intravenous Q Tue-HD  . feeding supplement (NEPRO CARB STEADY)  237 mL Oral BID BM  . gentamicin cream  1 application Topical Daily  . heparin  5,000 Units Subcutaneous Q8H  . hydrALAZINE  100 mg Oral TID  . insulin aspart  0-20 Units Subcutaneous TID WC  . insulin aspart  0-5 Units Subcutaneous QHS  . insulin NPH Human  10 Units Subcutaneous BID AC & HS  . losartan  50 mg Oral BID  . metoprolol tartrate  100 mg Oral BID  . multivitamin  1 tablet Oral QHS  . sevelamer carbonate  800 mg Oral TID WC

## 2016-12-02 NOTE — Progress Notes (Signed)
Nutrition Education Note  RD consulted for Renal Education. Provided Choose-A-Meal Booklet to patient/family. Reviewed food groups and provided written recommended serving sizes specifically determined for patient's current nutritional status.   Explained why diet restrictions are needed and provided lists of foods to limit/avoid that are high potassium, sodium, and phosphorus. Provided specific recommendations on safer alternatives of these foods. Strongly encouraged compliance of this diet.   Discussed importance of protein intake at each meal and snack. Provided examples of how to maximize protein intake throughout the day. Discussed need for fluid restriction with dialysis, importance of minimizing weight gain between HD treatments, and renal-friendly beverage options.  Encouraged pt to discuss specific diet questions/concerns with RD at HD outpatient facility. Teach back method used.  Expect fair compliance.  Body mass index is 25.46 kg/m. Pt meets criteria for overweight based on current BMI.  Current diet order is soft, patient is consuming approximately 75% of meals at this time. Labs and medications reviewed. No further nutrition interventions warranted at this time. RD contact information provided. If additional nutrition issues arise, please re-consult RD.  Koleen Distance, RD, LDN Pager #- [phone]

## 2016-12-03 ENCOUNTER — Inpatient Hospital Stay (HOSPITAL_COMMUNITY): Payer: Medicare Other | Admitting: Physical Therapy

## 2016-12-03 LAB — GLUCOSE, CAPILLARY
GLUCOSE-CAPILLARY: 231 mg/dL — AB (ref 65–99)
GLUCOSE-CAPILLARY: 241 mg/dL — AB (ref 65–99)
GLUCOSE-CAPILLARY: 74 mg/dL (ref 65–99)
Glucose-Capillary: 210 mg/dL — ABNORMAL HIGH (ref 65–99)
Glucose-Capillary: 157 mg/dL — ABNORMAL HIGH (ref 65–99)

## 2016-12-03 MED ORDER — LOSARTAN POTASSIUM 50 MG PO TABS
100.0000 mg | ORAL_TABLET | Freq: Every day | ORAL | Status: DC
  Administered 2016-12-03 – 2016-12-04 (×2): 100 mg via ORAL
  Filled 2016-12-03 (×2): qty 2

## 2016-12-03 MED ORDER — LOSARTAN POTASSIUM 50 MG PO TABS: 100.0000 mg | ORAL_TABLET | Freq: Two times a day (BID) | ORAL | Status: DC

## 2016-12-03 NOTE — Progress Notes (Signed)
Physical Therapy Session Note  Patient Details  Name: Jared [name] MRN: [id number] Date of Birth: Aug 20, 1956  Today's Date: 12/03/2016 PT Individual Time: 8546-2703 PT Individual Time Calculation (min): 53 min    Short Term Goals: Week 1:  PT Short Term Goal 1 (Week 1): STG=LTG  Skilled Therapeutic Interventions/Progress Updates:    Pt received in w/c & agreeable to tx. Pt noted soreness in abdomen & back, RN made aware & RN administered meds. Session focused on gait training, BLE strengthening, and balance/postural reactions. Gait training x 170 ft with RW & min assist with a few instances of knee buckling noted & min assist to maintain balance. Adjusted RW for increased positioning and cued pt to relax shoulders during gait. Pt demonstrates decreased step length BLE & decreased gait speed. Pt utilized cybex kinetron in sitting & standing with BUE support & cuing for upright posture, focusing on BLE strengthening. Utilized biodex limits of stability & ball toss game with pt progressing from BUE support>1UE support>no UE support. Pt with good ankle strategies & weight shifting with slow, controlled movements. Pt propelled w/c back to room with BUE & supervision for cardiovascular endurance training. At end of session pt left sitting in w/c with all needs within reach & set up with meal tray.   Therapy Documentation Precautions:  Precautions Precautions: Fall Precaution Comments: peritoneal access abdomen Restrictions Weight Bearing Restrictions: No   See Function Navigator for Current Functional Status.   Therapy/Group: Individual Therapy  [name] 12/03/2016, 5:09 PM

## 2016-12-03 NOTE — Progress Notes (Signed)
Subjective/Complaints: No issues overnite ROS Objective: Vital Signs: Blood pressure (!) 155/83, pulse 88, temperature 98.9 F (37.2 C), temperature source Oral, resp. rate 18, weight 78.1 kg (172 lb 2.9 oz), SpO2 96 %. No results found. Results for orders placed or performed during the hospital encounter of 12/01/16 (from the past 72 hour(s))  Glucose, capillary     Status: Abnormal   Collection Time: 12/01/16  9:07 PM  Result Value Ref Range   Glucose-Capillary 171 (H) 65 - 99 mg/dL  Glucose, capillary     Status: None   Collection Time: 12/02/16  3:04 AM  Result Value Ref Range   Glucose-Capillary 74 65 - 99 mg/dL  Glucose, capillary     Status: Abnormal   Collection Time: 12/02/16  6:38 AM  Result Value Ref Range   Glucose-Capillary 127 (H) 65 - 99 mg/dL  Glucose, capillary     Status: Abnormal   Collection Time: 12/02/16 11:22 AM  Result Value Ref Range   Glucose-Capillary 200 (H) 65 - 99 mg/dL  Glucose, capillary     Status: None   Collection Time: 12/02/16  4:53 PM  Result Value Ref Range   Glucose-Capillary 84 65 - 99 mg/dL  Glucose, capillary     Status: Abnormal   Collection Time: 12/02/16  8:44 PM  Result Value Ref Range   Glucose-Capillary 179 (H) 65 - 99 mg/dL  Glucose, capillary     Status: Abnormal   Collection Time: 12/03/16  3:28 AM  Result Value Ref Range   Glucose-Capillary 231 (H) 65 - 99 mg/dL  Glucose, capillary     Status: Abnormal   Collection Time: 12/03/16  5:56 AM  Result Value Ref Range   Glucose-Capillary 241 (H) 65 - 99 mg/dL     HEENT: normal Cardio: RRR Resp: CTA B/L and unlabored GI: BS positive and NT, ND Extremity:  Pulses positive and No Edema Skin:   Wound C/D/I and Left arm incisions dressed  Neuro: Lethargic, Abnormal Motor 4/5 in BUE and BLE and Other somnolent just woke up Musc/Skel:  Other no pain with UE or LE ROM Gen NAD   Assessment/Plan: 1. Functional deficits secondary to debility due to multiple medical issues  which require 3+ hours per day of interdisciplinary therapy in a comprehensive inpatient rehab setting. Physiatrist is providing close team supervision and 24 hour management of active medical problems listed below. Physiatrist and rehab team continue to assess barriers to discharge/monitor patient progress toward functional and medical goals. FIM: Function - Bathing Position: Wheelchair/chair at sink Body parts bathed by patient: Right arm, Left arm, Chest, Abdomen, Front perineal area, Buttocks, Right upper leg, Left upper leg, Right lower leg, Left lower leg Body parts bathed by helper: Back Assist Level: Touching or steadying assistance(Pt > 75%)  Function- Upper Body Dressing/Undressing What is the patient wearing?: Pull over shirt/dress Pull over shirt/dress - Perfomed by patient: Thread/unthread right sleeve, Thread/unthread left sleeve, Put head through opening, Pull shirt over trunk Function - Lower Body Dressing/Undressing What is the patient wearing?: Underwear, Pants, Socks, Shoes Underwear - Performed by patient: Thread/unthread right underwear leg, Thread/unthread left underwear leg, Pull underwear up/down Pants- Performed by patient: Thread/unthread right pants leg, Thread/unthread left pants leg, Pull pants up/down Socks - Performed by patient: Don/doff right sock, Don/doff left sock Shoes - Performed by patient: Don/doff right shoe, Don/doff left shoe, Fasten right, Fasten left Assist for footwear: Supervision/touching assist Assist for lower body dressing: Touching or steadying assistance (Pt > 75%)  Function - Toileting Toileting steps completed by patient: Adjust clothing prior to toileting, Performs perineal hygiene, Adjust clothing after toileting Toileting Assistive Devices: Grab bar or rail Assist level: Touching or steadying assistance (Pt.75%)  Function - Air cabin crew transfer assistive device: Grab bar, Walker Assist level to toilet: Moderate assist  (Pt 50 - 74%/lift or lower) Assist level from toilet: Moderate assist (Pt 50 - 74%/lift or lower)  Function - Chair/bed transfer Chair/bed transfer method: Ambulatory Chair/bed transfer assist level: Moderate assist (Pt 50 - 74%/lift or lower) Chair/bed transfer assistive device: Walker  Function - Locomotion: Ambulation Assistive device: Walker-rolling Max distance: 100 Assist level: Moderate assist (Pt 50 - 74%) Assist level: Moderate assist (Pt 50 - 74%) Assist level: Moderate assist (Pt 50 - 74%) Walk 150 feet activity did not occur: Safety/medical concerns Walk 10 feet on uneven surfaces activity did not occur: Safety/medical concerns  Function - Comprehension Comprehension: Auditory Comprehension assist level: Understands basic 75 - 89% of the time/ requires cueing 10 - 24% of the time  Function - Expression Expression: Verbal Expression assist level: Expresses basic 75 - 89% of the time/requires cueing 10 - 24% of the time. Needs helper to occlude trach/needs to repeat words.  Function - Social Interaction Social Interaction assist level: Interacts appropriately with others with medication or extra time (anti-anxiety, antidepressant).  Function - Problem Solving Problem solving assist level: Solves basic problems with no assist  Function - Memory Memory assist level: More than reasonable amount of time  Medical Problem List and Plan: 1. Functional and mobility deficits secondary to seizure and debility related to multiple medical issues -CIR PT, OT, SLP 2. DVT Prophylaxis/Anticoagulation: Pharmaceutical: Heparin 3. Pain Management: N/a 4. Mood: LCSW to follow for evaluation and support.  5. Neuropsych: This patient is not fully capable of making decisions on hisown behalf. 6. Skin/Wound Care: Maintain adequate nutrition and hydration status. Routine pressure relief measures.  7. Fluids/Electrolytes/Nutrition: Monitor I/O. Now on renal diet with 1200 cc  FR.  -lab work with HD 8. 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 No driving till follow up EEG /evaluation by neurology on outpatient basis.  9. ESRD: Has has been switched to HD on TTS pending neurologic recovery. Hope to transition to PD eventually in outpatient setting--patient upset and insistent that he will not be doing HD at discharge. RD to educate on renal diet. -HD to be scheduled after therapy day to accommodate rehab schedule 10 T2DM: Takes NPH 20 units bid with 25 units bid. BS continue to be poorly controlled will monitor BS 4-5 X day and adjust as needed. Has not seen primary MD X 3 years.  Cont current regimen CBG (last 3)   Recent Labs  12/02/16 2044 12/03/16 0328 12/03/16 0556  GLUCAP 179* 231* 241*    11. HTN: Monitor BP bid--on amlodipine, hydralazine, metoprolol and losartan. Medications continue to be adjusted for tighter control. Losartan increase to 100mg  BID on 1/7 Vitals:   12/02/16 2014 12/03/16 0550  BP: (!) 159/78 (!) 155/83  Pulse: 89 88  Resp:  18  Temp:  98.9 F (37.2 C)   12. Anemia of chronic disease: H/H monitored with HD. On aranesp for supplement. .   LOS (Days) 2 A FACE TO FACE EVALUATION WAS PERFORMED  KIRSTEINS,ANDREW E 12/03/2016, 8:04 AM

## 2016-12-03 NOTE — Progress Notes (Signed)
Subjective:  No new events Objective Vital signs in last 24 hours: Vitals:   12/02/16 1332 12/02/16 1922 12/02/16 2014 12/03/16 0550  BP: 138/79 (!) 169/95 (!) 159/78 (!) 155/83  Pulse: 81  89 88  Resp: 18   18  Temp: 97.9 F (36.6 C)   98.9 F (37.2 C)  TempSrc: Oral   Oral  SpO2: 98%   96%  Weight:    78.1 kg (172 lb 2.9 oz)   Weight change: -2 kg (-4 lb 6.6 oz)  Physical Exam: General: alert Ox3 , AAM  Lying in bed  Heart:RRR Lungs: CTA Abdomen:  bs pos , Soft NT, ND Extremities: no pedal edema Dialysis Access: pos bruit LUA AVF   Problem/Plan: 1. Seizures new onset - on IV Keppra. Neurology following. Dosing to po not felt will need long term. Working dx for [facility] [employer] + focal defects => profound hyperglycemia vs hypertensive encephalopathy precipitated his seizure./ (per neuro).  2. ESRD on [facility]> on HD at least for temporary time after PD failure.  Has been clipped to [facility] MWF AM shift.   Using AVF. PD cath in place. 3. HTN/ Vol : Establishing  edw / HTn on admit  Slowly improving . on Amlodipine 10 mg PO q hs/hydralazine 100 TID and metoprolol 100 mg PO BID. Now on losartan 100mg /d; probe for lower EDW 4. Anemia .60mg  q tues   5. Uncont DM - severe hyperglycemia on admission. 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  Challenge EDW for better BP control.   Pearson Grippe MD 12/03/2016,8:12 AM  LOS: 2 days   Labs: Basic Metabolic Panel:  Recent Labs Lab 11/26/16 1643  11/28/16 0413 11/29/16 0449 11/30/16 0526  NA  --   < > 135 137 136  K  --   < > 4.4 4.4 4.4  CL  --   < > 96* 100* 98*  CO2  --   < > 25 27 26   GLUCOSE  --   < > 381* 173* 171*  BUN  --   < > 67* 34* 48*  CREATININE  --   < > 10.46* 6.57* 8.58*  CALCIUM  --   < > 7.9* 8.1* 8.4*  PHOS 7.9*  --   --   --  4.7*  < > = values in this interval not displayed. Liver Function  Tests:  Recent Labs Lab 11/27/16 0822 11/28/16 0413 11/29/16 0449  AST 15 20 22   ALT 13* 13* 15*  ALKPHOS 50 49 52  BILITOT 0.5 0.5 0.3  PROT 5.4* 5.6* 5.9*  ALBUMIN 1.7* 1.9* 2.0*   No results for input(s): LIPASE, AMYLASE in the last 168 hours.  Recent Labs Lab 11/27/16 0931  AMMONIA 49*   CBC:  Recent Labs Lab 11/27/16 0822 11/28/16 0413 11/30/16 0526  WBC 8.6 8.0 5.6  HGB 10.8* 10.4* 9.9*  HCT 32.6* 32.4* 30.7*  MCV 81.1 81.8 82.7  PLT 173 197 197   Cardiac Enzymes: No results for input(s): CKTOTAL, CKMB, CKMBINDEX, TROPONINI in the last 168 hours. CBG:  Recent Labs Lab 12/02/16 1122 12/02/16 1653 12/02/16 2044 12/03/16 0328 12/03/16 0556  GLUCAP 200* 84 179* 231* 241*     Medications:  . amLODipine  10 mg Oral Daily  . calcitRIOL  0.5 mcg Oral Daily  . calcium carbonate  400 mg of elemental calcium Oral TID WC  . [START ON 12/05/2016] darbepoetin (ARANESP) injection - DIALYSIS  60 mcg Intravenous Q Tue-HD  . feeding supplement (NEPRO CARB STEADY)  237 mL Oral BID BM  . gentamicin cream  1 application Topical Daily  . heparin  5,000 Units Subcutaneous Q8H  . hydrALAZINE  100 mg Oral TID  . insulin aspart  0-20 Units Subcutaneous TID WC  . insulin aspart  0-5 Units Subcutaneous QHS  . insulin NPH Human  10 Units Subcutaneous BID AC & HS  . losartan  100 mg Oral BID  . metoprolol tartrate  100 mg Oral BID  . multivitamin  1 tablet Oral QHS  . sevelamer carbonate  800 mg Oral TID WC

## 2016-12-04 ENCOUNTER — Inpatient Hospital Stay (HOSPITAL_COMMUNITY): Payer: Medicare Other | Admitting: Occupational Therapy

## 2016-12-04 ENCOUNTER — Inpatient Hospital Stay (HOSPITAL_COMMUNITY): Payer: Medicare Other

## 2016-12-04 ENCOUNTER — Inpatient Hospital Stay (HOSPITAL_COMMUNITY): Payer: Medicare Other | Admitting: Physical Therapy

## 2016-12-04 DIAGNOSIS — R1030 Lower abdominal pain, unspecified: Secondary | ICD-10-CM

## 2016-12-04 DIAGNOSIS — E1165 Type 2 diabetes mellitus with hyperglycemia: Secondary | ICD-10-CM

## 2016-12-04 LAB — GLUCOSE, CAPILLARY
GLUCOSE-CAPILLARY: 161 mg/dL — AB (ref 65–99)
GLUCOSE-CAPILLARY: 247 mg/dL — AB (ref 65–99)
GLUCOSE-CAPILLARY: 157 mg/dL — AB (ref 65–99)
Glucose-Capillary: 261 mg/dL — ABNORMAL HIGH (ref 65–99)
Glucose-Capillary: 136 mg/dL — ABNORMAL HIGH (ref 65–99)
Glucose-Capillary: 209 mg/dL — ABNORMAL HIGH (ref 65–99)

## 2016-12-04 MED ORDER — INSULIN NPH (HUMAN) (ISOPHANE) 100 UNIT/ML ~~LOC~~ SUSP
15.0000 [IU] | Freq: Two times a day (BID) | SUBCUTANEOUS | Status: DC
  Administered 2016-12-04: 15 [IU] via SUBCUTANEOUS
  Filled 2016-12-04: qty 10

## 2016-12-04 MED ORDER — DELFLEX-LC/1.5% DEXTROSE 344 MOSM/L IP SOLN
100.0000 mL | INTRAPERITONEAL | Status: DC
  Administered 2016-12-06 – 2016-12-08 (×3): 10000 mL via INTRAPERITONEAL

## 2016-12-04 MED ORDER — GENTAMICIN SULFATE 0.1 % EX CREA: 1.0000 "application " | TOPICAL_CREAM | Freq: Every day | CUTANEOUS | Status: DC

## 2016-12-04 MED ORDER — DELFLEX-LC/2.5% DEXTROSE 394 MOSM/L IP SOLN
INTRAPERITONEAL | Status: DC
  Administered 2016-12-06 – 2016-12-07 (×2): 10000 mL via INTRAPERITONEAL
  Administered 2016-12-08: 10000 L via INTRAPERITONEAL

## 2016-12-04 MED ORDER — HEPARIN 1000 UNIT/ML FOR PERITONEAL DIALYSIS
2500.0000 [IU] | INTRAMUSCULAR | Status: DC | PRN
  Filled 2016-12-04 (×4): qty 2.5

## 2016-12-04 MED ORDER — DARBEPOETIN ALFA 60 MCG/0.3ML IJ SOSY
60.0000 ug | PREFILLED_SYRINGE | INTRAMUSCULAR | Status: DC
  Administered 2016-12-06: 60 ug via SUBCUTANEOUS
  Filled 2016-12-04 (×2): qty 0.3

## 2016-12-04 NOTE — Progress Notes (Signed)
Occupational Therapy Session Note  Patient Details  Name: Jared [name] MRN: [id number] Date of Birth: 1955/12/02  Today's Date: 12/04/2016 OT Individual Time: 7494-4967 OT Individual Time Calculation (min): 75 min  OT Missed Time: 15 Min (Off unit procedure then fatigue)    Short Term Goals:Week 1:  OT Short Term Goal 1 (Week 1): Pt will complete toilet transfer and LRAD with Min A OT Short Term Goal 2 (Week 1): Pt will complete sit<stand for LB ADLs with  Min A OT Short Term Goal 3 (Week 1): Pt will complete tub bench transfer with Min A and LRAD OT Short Term Goal 4 (Week 1): Pt will ambulate into bathroom for bathroom transfers with LRAD  Skilled Therapeutic Interventions/Progress Updates:    Session One: Pt seen for OT ADL bathing/dressing session. Pt in supine upon arrival, voicing increased soreness and tenderness in abdomen and lower back. RN and MD made aware and assessment performed. Pt willing to attempt showering task. He stood from EOB with VCs for hand placement and min-mod A. Pt required increased time once in standing to "get barrings". However upon standing, pt reports he didn't feel as if he could do shower due to pain. Pt agreeable to washing at sink. He ambulated ~4 ft with min A to w/c. He bathed with set-up seated in w/c, requiring VCs for initiation/ sequencing.  He dressed seated in w/c at sink with set-up/ supervision, VCs for locking of w/c breaks prior to standing.  He ambulated with steadying assist, no AD, into bathroom and completed standing toileting/ voiding with steadying assist. Increased time and rest breaks required throughout all tasks due to abdominal discomfort.  Pt left seated in w/c at end of session, instructed on use of call bell if assist needs, all needs in reach.   Session Two: Pt off unit for x-ray at scheduled 1300 therapy time. Therapist returned at 1415 and pt working with SLP. Theraaist returned again at 1445 and pt in supine attempting to  sleep. He declined therapy this afternoon stating he didn't think he could do therapy due to abdominal pain and fatigue. Pt left resting in supine, all needs in reach.  Pt missed 45 min tx session, will cont to make up as able. Cont POC  Therapy Documentation Precautions:  Precautions Precautions: Fall Precaution Comments: peritoneal access abdomen Restrictions Weight Bearing Restrictions: No ADL: ADL ADL Comments: Please see functional navigator for ADL status  See Function Navigator for Current Functional Status.   Therapy/Group: Individual Therapy  [name], [name] 12/04/2016, 7:12 AM

## 2016-12-04 NOTE — Care Management Note (Signed)
[facility] Individual Statement of Services  Patient Name:  Jared [name]  Date:  12/04/2016  Welcome to the [facility].  Our goal is to provide you with an individualized program based on your diagnosis and situation, designed to meet your specific needs.  With this comprehensive rehabilitation program, you will be expected to participate in at least 3 hours of rehabilitation therapies Monday-Friday, with modified therapy programming on the weekends.  Your rehabilitation program will include the following services:  Physical Therapy (PT), Occupational Therapy (OT), Speech Therapy (ST), 24 hour per day rehabilitation nursing, Therapeutic Recreaction (TR), Neuropsychology, Case Management (Social Worker), Rehabilitation Medicine, Nutrition Services and Pharmacy Services  Weekly team conferences will be held on Tuesdays to discuss your progress.  Your Social Worker will talk with you frequently to get your input and to update you on team discussions.  Team conferences with you and your family in attendance may also be held.  Expected length of stay: 7-10 days  Overall anticipated outcome: modified independent  Depending on your progress and recovery, your program may change. Your Social Worker will coordinate services and will keep you informed of any changes. Your Social Worker's name and contact numbers are listed  below.  The following services may also be recommended but are not provided by the [facility] will be made to provide these services after discharge if needed.  Arrangements include referral to agencies that provide these services.  Your insurance has been verified to be:  Medicare Your primary doctor is:  Dr. Lorrene Reid  Pertinent information will be shared with your doctor and your insurance  company.  Social Worker:  [place], [place] or ([phone]   Information discussed with and copy given to patient by: Lennart Pall, 12/04/2016, 3:24 PM

## 2016-12-04 NOTE — Progress Notes (Signed)
[facility] Progress Note   Subjective: c/o abd pain w moving and when coughing, "tight band" aroudn his waist.  No n/v/d, no recent heavy vomiting. Has been coughing, dry cough, no URI sx's.   Vitals:   12/03/16 0550 12/03/16 1411 12/03/16 2101 12/04/16 0404  BP: (!) 155/83 (!) 162/76 (!) 152/73 (!) 158/81  Pulse: 88 80 91 89  Resp: 18 17  18   Temp: 98.9 F (37.2 C) 97.9 F (36.6 C)  99.1 F (37.3 C)  TempSrc: Oral Oral  Oral  SpO2: 96% 94%  95%  Weight: 78.1 kg (172 lb 2.9 oz)       Inpatient medications: . amLODipine  10 mg Oral Daily  . calcitRIOL  0.5 mcg Oral Daily  . calcium carbonate  400 mg of elemental calcium Oral TID WC  . [START ON 12/05/2016] darbepoetin (ARANESP) injection - DIALYSIS  60 mcg Intravenous Q Tue-HD  . feeding supplement (NEPRO CARB STEADY)  237 mL Oral BID BM  . gentamicin cream  1 application Topical Daily  . heparin  5,000 Units Subcutaneous Q8H  . hydrALAZINE  100 mg Oral TID  . insulin aspart  0-20 Units Subcutaneous TID WC  . insulin aspart  0-5 Units Subcutaneous QHS  . insulin NPH Human  10 Units Subcutaneous BID AC & HS  . losartan  100 mg Oral Daily  . metoprolol tartrate  100 mg Oral BID  . multivitamin  1 tablet Oral QHS  . sevelamer carbonate  800 mg Oral TID WC    acetaminophen, alum & mag hydroxide-simeth, bisacodyl, diphenhydrAMINE, guaiFENesin-dextromethorphan, polyethylene glycol, prochlorperazine **OR** prochlorperazine **OR** prochlorperazine, traZODone  Exam: General: alert Ox3 , AAM  Lying in bed  Heart:RRR Lungs: CTA Abdomen:  bs pos , Soft NT, ND Extremities: no pedal edema Dialysis Access: pos bruit LUA AVF   Dialysis: CCPD  79kg   6 exch/ 24 hrs  2.5 L overnight vol and 2 L daytime vol, no pause.  Lab: Hb 12.9, pth 427      Assessment: 1. Seizures - due to ^^BS and/or HTN'sive enceph. BS and BP's better, no AED's at this time 2. HTN - on norvasc/ hydral/ metop/ losartan. Dc losartan d/t #3/  cough. Get plain film abd.  3. Abd pain - benign abd, no n/v/d. Related to dry cough, will dc ARB see if this helps 4. ESRD - PD pt, on HD now d/t AMS, low alb. Pt says he won't do HD.  Plan switch back to PD today.   5. L AVF - 1/04 per IR, had thrombosed AVF underwent declot and stenting of draining cephalic vein (multifocal stenosis didn't respond to repeated venoplasty) 6. Anemia cont max ESA 7. DM on insulin 8. 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  Plan - as above   Kelly Splinter MD [facility] pager [phone]   12/04/2016, 7:55 AM    Recent Labs Lab 11/28/16 0413 11/29/16 0449 11/30/16 0526  NA 135 137 136  K 4.4 4.4 4.4  CL 96* 100* 98*  CO2 25 27 26   GLUCOSE 381* 173* 171*  BUN 67* 34* 48*  CREATININE 10.46* 6.57* 8.58*  CALCIUM 7.9* 8.1* 8.4*  PHOS  --   --  4.7*    Recent Labs Lab 11/27/16 0822 11/28/16 0413 11/29/16 0449  AST 15 20 22   ALT 13* 13* 15*  ALKPHOS 50 49 52  BILITOT 0.5 0.5 0.3  PROT 5.4* 5.6* 5.9*  ALBUMIN 1.7* 1.9*  2.0*    Recent Labs Lab 11/27/16 0822 11/28/16 0413 11/30/16 0526  WBC 8.6 8.0 5.6  HGB 10.8* 10.4* 9.9*  HCT 32.6* 32.4* 30.7*  MCV 81.1 81.8 82.7  PLT 173 197 197   Iron/TIBC/Ferritin/ %Sat    Component Value Date/Time   IRON 78 11/28/2016 1546   TIBC 162 (L) 11/28/2016 1546   IRONPCTSAT 48 (H) 11/28/2016 1546

## 2016-12-04 NOTE — Evaluation (Signed)
Speech Language Pathology Assessment and Plan  Patient Details  Name: Jared [name] MRN: [id number] Date of Birth: [birth year]  SLP Diagnosis: Cognitive Impairments  Rehab Potential: Good ELOS: 7-10 days     Today's Date: 12/04/2016 SLP Individual Time: 5188-4166 SLP Individual Time Calculation (min): 30 min    Problem List: Patient Active Problem List   Diagnosis Date Noted  . Encephalopathy 12/01/2016  . Hypertension   . CKD (chronic kidney disease) stage V requiring chronic dialysis ([place])   . Acute respiratory failure with hypoxia ([place])   . Seizure ([place]) 11/24/2016  . Status epilepticus [facility])    Past Medical History:  Past Medical History:  Diagnosis Date  . Anemia   . Chronic kidney disease    on dialysis M-W-F  . Diabetes mellitus without complication ([place])    onset as adult  . Hypertension   . Thyroid disease    hyperparathyroidism   Past Surgical History:  Past Surgical History:  Procedure Laterality Date  . AV FISTULA PLACEMENT Left 06/04/2014   Procedure: ARTERIOVENOUS (AV) FISTULA CREATION-  BRACHIOCEPHALIC WITH LIGATION OF COMPETEING BRANCH;  Surgeon: Mal Misty, MD;  Location: [facility];  Service: Vascular;  Laterality: Left;  . INSERTION OF DIALYSIS CATHETER  04/2014  . IR GENERIC HISTORICAL Left 11/30/2016   IR THROMBECTOMY AV FISTULA W/THROMBOLYSIS/PTA/STENT INC/SHUNT/IMG LT 11/30/2016 Markus Daft, MD [facility]INTER[facility]  . 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 He was intubated for airway protection and was loaded with keppra. CTA head/neck negative for large vessel occlusion. MRI brain negative for acute changes. Seizures felt to be due to hyperglycemia and likely  contributing to RUE weakness. Dr. Leonel Ramsay recommends continuing [place]. Dose decreased due to sedation and delirium resolving. BP control improving with addition of Losartan and off insulin drip. Transitioned to HD and requried L-AV fistula thrombolysis by radiology on 01/4. Therapy evaluations done revealing generalized weakness with delayed processing and muffled speech. Patient admitted to IP Rehab 11/30/16.  Patient demonstrates cognitive impairments in the areas of recall, problem solving, attention and awareness. However, difficult to determine true cognitive impairment vs. decrease participation due to pain and fatigue. Patient reported he has not noted any change in his cognitive function but then became defensive when he demonstrated difficulty with a variety of items on the MoCA. Patient would benefit from skilled SLP intervention for continued diagnostic treatment and to maximize his cognitive function and overall functional independence prior to discharge.     Skilled Therapeutic Interventions          Administered a cognitive-linguistic evaluation. Please see above for details.   SLP Assessment  Patient will need skilled [place] [facility] during CIR admission    Recommendations  Oral Care Recommendations: Oral care BID Patient destination: Home Follow up Recommendations:  (TBD) Equipment Recommended: None recommended by SLP    SLP Frequency 3 to 5 out of 7 days   SLP Duration  SLP Intensity  SLP Treatment/Interventions 7-10 days   Minumum of 1-2 x/day, 30 to 90 minutes  Cognitive remediation/compensation;Cueing hierarchy;Functional tasks;Patient/family education;Internal/external aids;Therapeutic Activities    Pain Patient reports stomach pain. RN aware.    Function:  Cognition Comprehension Comprehension assist level: Understands  basic 75 - 89% of the time/ requires cueing 10 - 24% of the time  Expression   Expression assist level: Expresses basic  75 - 89% of the time/requires cueing 10 - 24% of the time. Needs helper to occlude trach/needs to repeat words.  Social Interaction Social Interaction assist level: Interacts appropriately 75 - 89% of the time - Needs redirection for appropriate language or to initiate interaction.  Problem Solving Problem solving assist level: Solves basic 50 - 74% of the time/requires cueing 25 - 49% of the time  Memory Memory assist level: Recognizes or recalls 75 - 89% of the time/requires cueing 10 - 24% of the time   Short Term Goals: Week 1: SLP Short Term Goal 1 (Week 1): Patient will demonstrate functional problem solving for complex tasks with supervision.  SLP Short Term Goal 2 (Week 1): Patient will recall new, daily informaiton with supervision.  SLP Short Term Goal 3 (Week 1): Patient will demonstrate selective attention to tasks for 30 minutes with supervision.   Refer to Care Plan for Long Term Goals  Recommendations for other services: Neuropsych  Discharge Criteria: Patient will be discharged from SLP if patient refuses treatment 3 consecutive times without medical reason, if treatment goals not met, if there is a change in medical status, if patient makes no progress towards goals or if patient is discharged from hospital.  The above assessment, treatment plan, treatment alternatives and goals were discussed and mutually agreed upon: by patient  Demesha Boorman 12/04/2016, 2:57 PM

## 2016-12-04 NOTE — Progress Notes (Signed)
Social Work  Social Work Assessment and Plan  Patient Details  Name: Jared [name] MRN: [id number] Date of Birth: 11/26/1956  Today's Date: 12/04/2016  Problem List:  Patient Active Problem List   Diagnosis Date Noted  . Encephalopathy 12/01/2016  . Hypertension   . CKD (chronic kidney disease) stage V requiring chronic dialysis ([place])   . Acute respiratory failure with hypoxia ([place])   . Seizure ([place]) 11/24/2016  . Status epilepticus [facility])    Past Medical History:  Past Medical History:  Diagnosis Date  . Anemia   . Chronic kidney disease    on dialysis M-W-F  . Diabetes mellitus without complication ([place])    onset as adult  . Hypertension   . Thyroid disease    hyperparathyroidism   Past Surgical History:  Past Surgical History:  Procedure Laterality Date  . AV FISTULA PLACEMENT Left 06/04/2014   Procedure: ARTERIOVENOUS (AV) FISTULA CREATION-  BRACHIOCEPHALIC WITH LIGATION OF COMPETEING BRANCH;  Surgeon: Mal Misty, MD;  Location: [facility];  Service: Vascular;  Laterality: Left;  . INSERTION OF DIALYSIS CATHETER  04/2014  . IR GENERIC HISTORICAL Left 11/30/2016   IR THROMBECTOMY AV FISTULA W/THROMBOLYSIS/PTA/STENT INC/SHUNT/IMG LT 11/30/2016 Markus Daft, MD [facility]  . IR GENERIC HISTORICAL  11/30/2016   IR US GUIDE VASC ACCESS LEFT 11/30/2016 Markus Daft, MD [facility]   Social History:  reports that he quit smoking about 32 years ago. He has never used smokeless tobacco. He reports that he drinks alcohol. He reports that he does not use drugs.  Family / Support Systems Marital Status: Divorced Patient Roles: Parent Children: one son, Rimas @ [phone], living locally per pt Other Supports: sister, [name] (pt lives with her) @ (C) [phone];  sister, [name] @ [phone] Anticipated Caregiver: Sister intermittent supervision only as she is disabled and cares for her grandchild.  Sister reports that pt's son has promised to assist. Ability/Limitations of  Caregiver: sister can only provide intermittent supervision; see above  Caregiver Availability: Intermittent Family Dynamics: Sister supportive and reports that she is relying on pt's son to assist as he promised.  She seems concerned about pt's medical issues and dialysis needs.  Social History Preferred language: English Religion: Baptist Cultural Background: NA Read: Yes Write: Yes Employment Status: Disabled Date Retired/Disabled/Unemployed: ~ 1 1/2 yrs Freight forwarder Issues: None Guardian/Conservator: None - per MD, pt is not fully capable of making decisions on his own behalf.  Defer to sister.   Abuse/Neglect Physical Abuse: Denies Verbal Abuse: Denies Sexual Abuse: Denies Exploitation of patient/patient's resources: Denies Self-Neglect: Denies  Emotional Status Pt's affect, behavior adn adjustment status: Pt lying in bed and appears very fatigued.  C/o not sleeping well.  Difficulty to engage and he only offers brief answers.  He denies any significant emotional distress, however, may benefit from neuropsychology consult/ screen. Recent Psychosocial Issues: Start of dialysis ~ 1 1/2 yrs ago per pt Pyschiatric History: None Substance Abuse History: None  Patient / Family Perceptions, Expectations & Goals Pt/Family understanding of illness & functional limitations: Pt and sister with general understanding of medical issues.  Pt reports "I had some seizures" and aware they were r/t BS and BP issues.  Sister with general awareness of functional deficits/ need for CIR.  Pt with some resistance to tx sessions citing pain. Premorbid pt/family roles/activities: Pt independent PTA and on PD at home Anticipated changes in roles/activities/participation: little change anticipated if pt able to reach mod ind goals.  Son  to assume more caregiver support role. Pt/family expectations/goals: "I just need to get better and go home."  [employer]:  None Premorbid Home Care/DME Agencies: None Transportation available at discharge: yes Resource referrals recommended: Neuropsychology  Discharge Planning Living Arrangements: Other relatives Support Systems: Children, Other relatives Type of Residence: Private residence Insurance Resources: Chartered certified accountant Resources: [employer] Financial Screen Referred: No Living Expenses: Lives with family Money Management: Patient Does the patient have any problems obtaining your medications?: Yes (Describe) (may have to wait until ) Patient/Family Preliminary Plans: Pt to d/c home with sister and brother-in-law as he was living with them prior.  Pt's son, Franklyn, with plans to move into  the home and assist as well. Social Work Anticipated Follow Up Needs: HH/OP Expected length of stay: 7-10 days  Clinical Impression Unfortunate gentleman here with debility and encephalopathy following seizures.  Also a PD pt at home.  Flat affect and offers only brief answers to questions.  Difficult to engage but does complete assessment.  He denies any emotional distress, however, many c/o of pain and demand of tx schedule.  Living with sister and plans to return.  Sister reports that she "has a lot on me" and that pt's son to assist at her home as well. Will follow for support and d/c planning needs.  Lindell Tussey 12/04/2016, 4:05 PM

## 2016-12-04 NOTE — Progress Notes (Signed)
Physical Therapy Session Note  Patient Details  Name: Jared [name] MRN: [id number] Date of Birth: December 23, 1955  Today's Date: 12/04/2016 PT Individual Time: 0915-1015 PT Individual Time Calculation (min): 60 min    Short Term Goals: Week 1:  PT Short Term Goal 1 (Week 1): STG=LTG  Skilled Therapeutic Interventions/Progress Updates:   Pt received seated in w/c, c/o pain 7/10 in abdomen and back and reports recently taking pain medication, otherwise agreeable to treatment. W/c propulsion to gym with distant S; use of BUEs for strengthening and endurance. Gait x90' with RW and S. Gait x180' without AD, min guard; no LOB and only mild staggering noted which pt recovers without assist. Nustep x10 min with BUE/BLE for strengthening and endurance. Standing balance activity with toe taps to numbered targets and onto object of varying heights for weight shifting, single limb stance balance, coordination; note consistent LOB toward L side when tapping with R foot. Sit <>stand 2x10 reps with table elevated and S. Standing heel raises and hamstring curls 2x15 with light UE support; requires cues for speed, height of heel raise. BP assessed 177/75. Pt requesting to return to room d/t pain. Transported totalA for energy conservation. Stand pivot transfer min guard, with poor eccentric control to sit and increased pain when sitting. Sit >supine with S, and vocalizing significant pain. Remained supine in bed with alarm intact at end of session, all needs in reach. RN alerted to pt c/o pain limiting participation.   Therapy Documentation Precautions:  Precautions Precautions: Fall Precaution Comments: peritoneal access abdomen Restrictions Weight Bearing Restrictions: No General: PT Amount of Missed Time (min): 15 Minutes PT Missed Treatment Reason: Pain   See Function Navigator for Current Functional Status.   Therapy/Group: Individual Therapy  Luberta Mutter 12/04/2016, 10:23 AM

## 2016-12-04 NOTE — Progress Notes (Signed)
Physical Therapy Session Note  Patient Details  Name: Jared [name] MRN: [id number] Date of Birth: May 14, 1956  Today's Date: 12/04/2016 PT Individual Time: 1500-1530 PT Individual Time Calculation (min): 30 min    Short Term Goals: Week 1:  PT Short Term Goal 1 (Week 1): STG=LTG  Skilled Therapeutic Interventions/Progress Updates:   Pt seen for 30 missed minutes earlier today. C/o pain however initially agreeable. Performs bed mobility with S. Seated on EOB pt dons shirt with setupA, pants LLE without assist then becomes frustrated by difficulty getting RLE into pants. Pt verbally agitated, cursing and stating "you're just sitting there watching me struggle"; educated pt on purpose of therapy to teac independence with ADLs, and encouraged pt to ask for assistance when needed. Helped pt by threading RLE, pt then able to complete dressing without assist. Gait in/out of bathroom with no AD, close S. Standing balance while urinating with S, use of grab bar and over-toilet shelf to steady himself on. Pt declining further participation in therapy due to pain; reports that MD ordered x-rays due to pain, so he does not think he should be doing anything else until they know what is going on. Discussed with pt that orders for therapy had not been changed, and that performance of basic ADLs such as getting in/out of bed and going to bathroom are beneficial for countering bedrest and promoting return to function. Pt continues to decline participation. Remained seated in recliner at end of session, all needs in reach. RN alerted to pt request for removal of IV, dressings.   Therapy Documentation Precautions:  Precautions Precautions: Fall Precaution Comments: peritoneal access abdomen Restrictions Weight Bearing Restrictions: No  See Function Navigator for Current Functional Status.   Therapy/Group: Individual Therapy  Luberta Mutter 12/04/2016, 3:26 PM

## 2016-12-04 NOTE — Progress Notes (Signed)
Patient information reviewed and entered into eRehab system by [place]uwatife Flight, RN, CRRN, PPS Coordinator.  Information including medical coding and functional independence measure will be reviewed and updated through discharge.     Per nursing patient was given "Data Collection Information Summary for Patients in Inpatient Rehabilitation Facilities with attached "Privacy Act Statement-Health Care Records" upon admission.  

## 2016-12-04 NOTE — Progress Notes (Signed)
Subjective/Complaints: Complains of lower abdomen pain. Having difficulty getting out of bed. States that it's "been here since I got to hospital"> denies nausea,vomiting. Has moved bowels.   ROS: pt denies nausea, vomiting, diarrhea, cough, shortness of breath or chest pain   Objective: Vital Signs: Blood pressure (!) 158/81, pulse 89, temperature 99.1 F (37.3 C), temperature source Oral, resp. rate 18, weight 78.1 kg (172 lb 2.9 oz), SpO2 95 %. No results found. Results for orders placed or performed during the hospital encounter of 12/01/16 (from the past 72 hour(s))  Glucose, capillary     Status: Abnormal   Collection Time: 12/01/16  9:07 PM  Result Value Ref Range   Glucose-Capillary 171 (H) 65 - 99 mg/dL  Glucose, capillary     Status: None   Collection Time: 12/02/16  3:04 AM  Result Value Ref Range   Glucose-Capillary 74 65 - 99 mg/dL  Glucose, capillary     Status: Abnormal   Collection Time: 12/02/16  6:38 AM  Result Value Ref Range   Glucose-Capillary 127 (H) 65 - 99 mg/dL  Glucose, capillary     Status: Abnormal   Collection Time: 12/02/16 11:22 AM  Result Value Ref Range   Glucose-Capillary 200 (H) 65 - 99 mg/dL  Glucose, capillary     Status: None   Collection Time: 12/02/16  4:53 PM  Result Value Ref Range   Glucose-Capillary 84 65 - 99 mg/dL  Glucose, capillary     Status: Abnormal   Collection Time: 12/02/16  8:44 PM  Result Value Ref Range   Glucose-Capillary 179 (H) 65 - 99 mg/dL  Glucose, capillary     Status: Abnormal   Collection Time: 12/03/16  3:28 AM  Result Value Ref Range   Glucose-Capillary 231 (H) 65 - 99 mg/dL  Glucose, capillary     Status: Abnormal   Collection Time: 12/03/16  5:56 AM  Result Value Ref Range   Glucose-Capillary 241 (H) 65 - 99 mg/dL  Glucose, capillary     Status: Abnormal   Collection Time: 12/03/16 11:37 AM  Result Value Ref Range   Glucose-Capillary 210 (H) 65 - 99 mg/dL  Glucose, capillary     Status: None    Collection Time: 12/03/16  5:02 PM  Result Value Ref Range   Glucose-Capillary 74 65 - 99 mg/dL  Glucose, capillary     Status: Abnormal   Collection Time: 12/03/16  8:43 PM  Result Value Ref Range   Glucose-Capillary 157 (H) 65 - 99 mg/dL  Glucose, capillary     Status: Abnormal   Collection Time: 12/04/16  3:58 AM  Result Value Ref Range   Glucose-Capillary 247 (H) 65 - 99 mg/dL  Glucose, capillary     Status: Abnormal   Collection Time: 12/04/16  6:56 AM  Result Value Ref Range   Glucose-Capillary 261 (H) 65 - 99 mg/dL     HEENT: normal Cardio: RRR Resp: clear, non-labored GI: BS present but scarce, higher pitched, some distention. TTP lower abdomen below catheter. Denies pain at cath Extremity:  Pulses positive and No Edema Skin:   Cath site intact.  Left arm incisions dressed  Neuro: alert.  Abnormal Motor 4/5 in BUE and BLE. No focal sensory deficits Musc/Skel:  Other no pain with UE or LE ROM Gen NAD   Assessment/Plan: 1. Functional deficits secondary to debility due to multiple medical issues which require 3+ hours per day of interdisciplinary therapy in a comprehensive inpatient rehab setting. Physiatrist is providing  close team supervision and 24 hour management of active medical problems listed below. Physiatrist and rehab team continue to assess barriers to discharge/monitor patient progress toward functional and medical goals. FIM: Function - Bathing Position: Wheelchair/chair at sink Body parts bathed by patient: Right arm, Left arm, Chest, Abdomen, Front perineal area, Buttocks, Right upper leg, Left upper leg, Right lower leg, Left lower leg Body parts bathed by helper: Back Assist Level: Touching or steadying assistance(Pt > 75%)  Function- Upper Body Dressing/Undressing What is the patient wearing?: Pull over shirt/dress Pull over shirt/dress - Perfomed by patient: Thread/unthread right sleeve, Thread/unthread left sleeve, Put head through opening, Pull  shirt over trunk Function - Lower Body Dressing/Undressing What is the patient wearing?: Underwear, Pants, Socks, Shoes Underwear - Performed by patient: Thread/unthread right underwear leg, Thread/unthread left underwear leg, Pull underwear up/down Pants- Performed by patient: Thread/unthread right pants leg, Thread/unthread left pants leg, Pull pants up/down Socks - Performed by patient: Don/doff right sock, Don/doff left sock Shoes - Performed by patient: Don/doff right shoe, Don/doff left shoe, Fasten right, Fasten left Assist for footwear: Supervision/touching assist Assist for lower body dressing: Touching or steadying assistance (Pt > 75%)  Function - Toileting Toileting steps completed by patient: Adjust clothing prior to toileting, Performs perineal hygiene, Adjust clothing after toileting Toileting Assistive Devices: Grab bar or rail Assist level: Touching or steadying assistance (Pt.75%)  Function - Air cabin crew transfer assistive device: Grab bar, Walker Assist level to toilet: Moderate assist (Pt 50 - 74%/lift or lower) Assist level from toilet: Moderate assist (Pt 50 - 74%/lift or lower)  Function - Chair/bed transfer Chair/bed transfer method: Stand pivot Chair/bed transfer assist level: Touching or steadying assistance (Pt > 75%) Chair/bed transfer assistive device: Armrests  Function - Locomotion: Wheelchair Type: Manual Max wheelchair distance: 120 ft Assist Level: Supervision or verbal cues Assist Level: Supervision or verbal cues Function - Locomotion: Ambulation Assistive device: Walker-rolling Max distance: 170 ft Assist level: Touching or steadying assistance (Pt > 75%) Assist level: Touching or steadying assistance (Pt > 75%) Assist level: Touching or steadying assistance (Pt > 75%) Walk 150 feet activity did not occur: Safety/medical concerns Assist level: Touching or steadying assistance (Pt > 75%) Walk 10 feet on uneven surfaces activity did  not occur: Safety/medical concerns  Function - Comprehension Comprehension: Auditory Comprehension assist level: Understands basic 75 - 89% of the time/ requires cueing 10 - 24% of the time  Function - Expression Expression: Verbal Expression assist level: Expresses basic 75 - 89% of the time/requires cueing 10 - 24% of the time. Needs helper to occlude trach/needs to repeat words.  Function - Social Interaction Social Interaction assist level: Interacts appropriately with others with medication or extra time (anti-anxiety, antidepressant).  Function - Problem Solving Problem solving assist level: Solves basic problems with no assist  Function - Memory Memory assist level: More than reasonable amount of time Patient normally able to recall (first 3 days only): Staff names and faces, That he or she is in a hospital, Current season  Medical Problem List and Plan: 1. Functional and mobility deficits secondary to seizure and debility related to multiple medical issues -continue CIR PT, OT, SLP 2. DVT Prophylaxis/Anticoagulation: Pharmaceutical: Heparin 3. Pain Management: N/a 4. Mood: LCSW to follow for evaluation and support.  5. Neuropsych: This patient is not fully capable of making decisions on hisown behalf. 6. Skin/Wound Care: Maintain adequate nutrition and hydration status. Routine pressure relief measures.  7. Fluids/Electrolytes/Nutrition: Monitor I/O. Now on renal  diet with 1200 cc FR.  -lab work with HD sessions 8. 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 No driving till follow up EEG /evaluation by neurology on outpatient basis.  9. ESRD: Has has been switched to HD on TTS pending neurologic recovery. Hope to transition to PD eventually in outpatient setting-- . -HD   scheduled after therapy day to accommodate rehab schedule 10 T2DM: BS continue to be poorly controlled   -increase NPH to 15u  bid Cont current regimen CBG (last 3)   Recent Labs  12/03/16 2043 12/04/16 0358 12/04/16 0656  GLUCAP 157* 247* 261*    11. HTN: Monitor BP bid--on amlodipine, hydralazine, metoprolol and losartan. Medications continue to be adjusted for tighter control. Losartan increase to 100mg  BID on 1/7 Vitals:   12/03/16 2101 12/04/16 0404  BP: (!) 152/73 (!) 158/81  Pulse: 91 89  Resp:  18  Temp:  99.1 F (37.3 C)   12. Anemia of chronic disease: H/H monitored with HD. On aranesp for supplement.  13. Abdominal pain: some distention on exam  -no BM since 1/4  -check KUB  -discussed with Nephrology. ?tap  LOS (Days) 3 A FACE TO FACE EVALUATION WAS PERFORMED  Jared [name] T 12/04/2016, 8:22 AM

## 2016-12-04 NOTE — IPOC Note (Signed)
Overall Plan of Care [facility]) Patient Details Name: Jared [name] MRN: [id number] DOB: 1956/03/21  Admitting Diagnosis: [facility] Problems: Principal Problem:   Encephalopathy Active Problems:   Seizure ([place])   CKD (chronic kidney disease) stage V requiring chronic dialysis ([place])     Functional Problem List: Nursing Behavior, Bladder, Edema, Endurance, Nutrition, Pain, Safety, Skin Integrity, Medication Management, Bowel, Motor, Perception  PT Balance, Endurance, Motor, Safety  OT Balance, Endurance, Motor, Safety  SLP    TR         Basic ADL's: OT Grooming, Bathing, Dressing, Toileting     Advanced  ADL's: OT Simple Meal Preparation, Light Housekeeping     Transfers: PT Bed to Chair, Teacher, early years/pre, Tub/Shower     Locomotion: PT Ambulation, Stairs     Additional Impairments: OT None  SLP        TR      Anticipated Outcomes Item Anticipated Outcome  Self Feeding N/A  Swallowing      Basic self-care  Mod I   Toileting  Mod I    Bathroom Transfers Mod I   Bowel/Bladder   supervision ,continent of bladder and bowel  Transfers  Mod I   Locomotion  Mod I   Communication     Cognition     Pain  less<2  Safety/Judgment  min assist.   Therapy Plan: PT Frequency: Total of 15 hours over 7 days of combined therapies PT Duration Estimated Length of Stay: 7-10 days OT Intensity: Minimum of 1-2 x/day, 45 to 90 minutes OT Frequency: 5 out of 7 days OT Duration/Estimated Length of Stay: 8-10 days         Team Interventions: Nursing Interventions Patient/Family Education, Bladder Management, Bowel Management, Disease Management/Prevention, Pain Management, Medication Management, Skin Care/Wound Management, Cognitive Remediation/Compensation, Discharge Planning, Psychosocial Support  PT interventions Ambulation/gait training, Discharge planning, Patient/family education, Functional mobility training, Therapeutic Exercise, UE/LE Coordination  activities, Therapeutic Activities, Neuromuscular re-education, Stair training, UE/LE Strength taining/ROM  OT Interventions Discharge planning, Self Care/advanced ADL retraining, Therapeutic Activities, UE/LE Coordination activities, Functional mobility training, Patient/family education, Therapeutic Exercise, DME/adaptive equipment instruction, Psychosocial support, UE/LE Strength taining/ROM  SLP Interventions    TR Interventions    SW/CM Interventions Discharge Planning, Psychosocial Support, Patient/Family Education    Team Discharge Planning: Destination: PT-Home ,OT- Home , SLP-  Projected Follow-up: PT-Home health PT, OT-  Home health OT, SLP-  Projected Equipment Needs: PT-To be determined, OT- To be determined, SLP-  Equipment Details: PT- , OT-  Patient/family involved in discharge planning: PT- Patient,  OT-Patient, SLP-   MD ELOS: 7-10 days  Medical Rehab Prognosis:  Excellent Assessment: The patient has been admitted for CIR therapies with the diagnosis of debility/encephalopathy. The team will be addressing functional mobility, strength, stamina, balance, safety, adaptive techniques and equipment, self-care, bowel and bladder mgt, patient and caregiver education, pain mgt, ego support, management of renal issues/dialysis in context of therapy and dispo plan. Goals have been set at mod I for mobility and self-care.    [name], MD, FAAPMR      See Team Conference Notes for weekly updates to the plan of care

## 2016-12-05 ENCOUNTER — Inpatient Hospital Stay (HOSPITAL_COMMUNITY): Payer: Medicare Other | Admitting: Occupational Therapy

## 2016-12-05 ENCOUNTER — Inpatient Hospital Stay (HOSPITAL_COMMUNITY): Payer: Medicare Other | Admitting: Physical Therapy

## 2016-12-05 ENCOUNTER — Inpatient Hospital Stay (HOSPITAL_COMMUNITY): Payer: Medicare Other | Admitting: Speech Pathology

## 2016-12-05 LAB — GLUCOSE, CAPILLARY
GLUCOSE-CAPILLARY: 178 mg/dL — AB (ref 65–99)
GLUCOSE-CAPILLARY: 88 mg/dL (ref 65–99)
Glucose-Capillary: 271 mg/dL — ABNORMAL HIGH (ref 65–99)
Glucose-Capillary: 331 mg/dL — ABNORMAL HIGH (ref 65–99)
Glucose-Capillary: 60 mg/dL — ABNORMAL LOW (ref 65–99)
Glucose-Capillary: 160 mg/dL — ABNORMAL HIGH (ref 65–99)

## 2016-12-05 MED ORDER — SORBITOL 70 % SOLN: 30.0000 mL | Freq: Once | Status: DC

## 2016-12-05 MED ORDER — SENNOSIDES-DOCUSATE SODIUM 8.6-50 MG PO TABS
2.0000 | ORAL_TABLET | Freq: Every day | ORAL | Status: DC
  Administered 2016-12-05 – 2016-12-08 (×4): 2 via ORAL
  Filled 2016-12-05 (×4): qty 2

## 2016-12-05 MED ORDER — POLYETHYLENE GLYCOL 3350 17 G PO PACK: 17.0000 g | PACK | Freq: Every day | ORAL | Status: AC

## 2016-12-05 MED ORDER — SORBITOL 70 % SOLN
60.0000 mL | Status: AC
  Administered 2016-12-05: 60 mL via ORAL
  Filled 2016-12-05: qty 60

## 2016-12-05 MED ORDER — INSULIN NPH (HUMAN) (ISOPHANE) 100 UNIT/ML ~~LOC~~ SUSP
20.0000 [IU] | Freq: Two times a day (BID) | SUBCUTANEOUS | Status: DC
  Administered 2016-12-05 – 2016-12-06 (×3): 20 [IU] via SUBCUTANEOUS
  Filled 2016-12-05: qty 10

## 2016-12-05 NOTE — Progress Notes (Signed)
Physical Therapy Session Note  Patient Details  Name: Jared [name] MRN: [id number] Date of Birth: 1956/03/31   Skilled Therapeutic Interventions/Progress Updates:    Attempted to see patient for 60 min PT session; declining participation due to significant abdominal pain despite MD and therapist recommendation that pt participate in therapy. Therapist will follow up as schedule allows to make up time.   Therapy Documentation General: PT Amount of Missed Time (min): 60 Minutes   See Function Navigator for Current Functional Status.   Benjiman Core Tygielski 12/05/2016, 8:15 AM

## 2016-12-05 NOTE — Progress Notes (Signed)
Speech Language Pathology Daily Session Note  Patient Details  Name: Jared [name] MRN: [id number] Date of Birth: 03/04/1956  Today's Date: 12/05/2016 SLP Individual Time: 1330-1350 SLP Individual Time Calculation (min): 20 min  Missed Time: 10 minutes   Short Term Goals: Week 1: SLP Short Term Goal 1 (Week 1): Patient will demonstrate functional problem solving for complex tasks with supervision.  SLP Short Term Goal 2 (Week 1): Patient will recall new, daily informaiton with supervision.  SLP Short Term Goal 3 (Week 1): Patient will demonstrate selective attention to tasks for 30 minutes with supervision.   Skilled Therapeutic Interventions: Skilled treatment session focused on cognitive goals. Upon arrival, patient was sitting on the commode. Patient called for assistance prior to standing and performed all self-care tasks with supervision. Verbal cues needed for reaching back for chair prior to sitting. SLP facilitated session by providing Max A verbal and question cues for recall of her current medications and their functions. However, patient recalled the times the medications should be administered with Min A verbal cues. Patient lethargic throughout session with limited engagement. Patient reported, "I'm done, I can't do anything else." Therefore, patient transferred back to bed and missed remaining 10 minutes of session. Continue with current plan of care.   Function:  Cognition Comprehension Comprehension assist level: Understands basic 75 - 89% of the time/ requires cueing 10 - 24% of the time  Expression   Expression assist level: Expresses basic 75 - 89% of the time/requires cueing 10 - 24% of the time. Needs helper to occlude trach/needs to repeat words.  Social Interaction Social Interaction assist level: Interacts appropriately 75 - 89% of the time - Needs redirection for appropriate language or to initiate interaction.  Problem Solving Problem solving assist level: Solves  basic 75 - 89% of the time/requires cueing 10 - 24% of the time  Memory Memory assist level: Recognizes or recalls 50 - 74% of the time/requires cueing 25 - 49% of the time    Pain Patient premedicated and repositioned   Therapy/Group: Individual Therapy  Jared [name] 12/05/2016, 2:09 PM

## 2016-12-05 NOTE — Progress Notes (Signed)
Occupational Therapy Session Note  Patient Details  Name: Jared [name] MRN: [id number] Date of Birth: [birth year]  Today's Date: 12/05/2016 OT Individual Time: 1105-1200 OT Individual Time Calculation (min): 55 min     Short Term Goals:Week 1:  OT Short Term Goal 1 (Week 1): Pt will complete toilet transfer and LRAD with Min A OT Short Term Goal 2 (Week 1): Pt will complete sit<stand for LB ADLs with  Min A OT Short Term Goal 3 (Week 1): Pt will complete tub bench transfer with Min A and LRAD OT Short Term Goal 4 (Week 1): Pt will ambulate into bathroom for bathroom transfers with LRAD  Skilled Therapeutic Interventions/Progress Updates:    Pt seen for OT ADL bathing/dressing session. Pt sitting up in w/c upon arrival with sister present though she did not stay for therapy session. Pt with no pain in sitting, however, has sporadic spasm like episodes in abdomen causing 9/10 pain. RN aware and pt voiced feeling better following shower, however, cont to require increased time and rest breaks with all tasks due to abdominal dicomfort. Pt stated pain is worse when bending over or leaning back, therefore pt required increased assist for sit> stand.  He ambulated with hand held assist into bathroom. Required urgent sit due to pain and required max- total A for positioning of hips into chair.  Pt bathed seated on tub bench with set-up assist.  He dressed seated in w/c, required increased time and rest breaks due to abdominal pain and steadying assist when standing to pull pants up.  He completed grooming task standing at sink with close supervision.  Pt returned to recliner at end of session, left seated with all needs in reach, cont to be in lots of pain in the abdomen.   Therapy Documentation Precautions:  Precautions Precautions: Fall Precaution Comments: peritoneal access abdomen Restrictions Weight Bearing Restrictions: No ADL: ADL ADL Comments: Please see functional navigator for ADL  status  See Function Navigator for Current Functional Status.   Therapy/Group: Individual Therapy  [name], Reign Dziuba C 12/05/2016, 7:19 AM

## 2016-12-05 NOTE — Progress Notes (Signed)
Physical Therapy Session Note  Patient Details  Name: Jared [name] MRN: [id number] Date of Birth: Sep 05, 1956  Today's Date: 12/05/2016 PT Individual Time: 1300-1330 PT Individual Time Calculation (min): 30 min    Short Term Goals: Week 1:  PT Short Term Goal 1 (Week 1): STG=LTG  Skilled Therapeutic Interventions/Progress Updates:   Pt received supine in bed, agreeable to treatment with encouragement and education regarding benefits of exercise for constipation. Reports 8/10 pain in abdomen. Performs bed mobility modI with increased time. Gait into bathroom with min guard initially, increased to minA HHA due to pt pain and urgency to get to bathroom. MinA for pulling down pants due to urgency. Ambulated out of bathroom min guard. Standing balance/endurance at sink with S while washing hands. Sit >stand from recliner with close S. Standing LE strengthening exercises 2x15 each including heel raises, toe raises, marching, mini-squats. Pt again with urgency to use restroom; ambulated back into bathroom with RW and S; manages clothing without assist. Pt reports RW eases pain when walking; plan to order one for d/c. Pt remained on toilet at end of session with instruction to use call bell when finished; RN and SLP for next session alerted to pt position.   Therapy Documentation Precautions:  Precautions Precautions: Fall Precaution Comments: peritoneal access abdomen Restrictions Weight Bearing Restrictions: No  See Function Navigator for Current Functional Status.   Therapy/Group: Individual Therapy  Luberta Mutter 12/05/2016, 1:36 PM

## 2016-12-05 NOTE — Patient Care Conference (Signed)
Inpatient RehabilitationTeam Conference and Plan of Care Update Date: 12/05/2016   Time: 2:05 PM    Patient Name: Jared [name]      Medical Record Number: [id number]  Date of Birth: 16-Jan-1956 Sex: Male         Room/Bed: 4M03C/4M03C-01 Payor Info: Payor: MEDICARE / Plan: MEDICARE PART A AND B / Product Type: *No Product type* /    Admitting Diagnosis: Debility  Admit Date/Time:  12/01/2016  3:41 PM Admission Comments: No comment available   Primary Diagnosis:  Encephalopathy Principal Problem: Encephalopathy  Patient Active Problem List   Diagnosis Date Noted  . Encephalopathy 12/01/2016  . Hypertension   . CKD (chronic kidney disease) stage V requiring chronic dialysis ([place])   . Acute respiratory failure with hypoxia ([place])   . Seizure ([place]) 11/24/2016  . Status epilepticus [facility])     Expected Discharge Date: Expected Discharge Date: 12/09/16  Team Members Present: Physician leading conference: Dr. Alger Simons Social Worker Present: Lennart Pall, LCSW Nurse Present: Heather Roberts, RN PT Present: Kem Parkinson, Rachel Moulds, PT OT Present: Napoleon Form, OT SLP Present: Weston Anna, SLP PPS Coordinator present : Daiva Nakayama, RN, CRRN     Current Status/Progress Goal Weekly Team Focus  Medical   debility/encephalopathy related to seizure/multiple medical. 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 Patient is oliguric.   Remain continent   Monitor for changes in bowel and bladder.    Swallow/Nutrition/ Hydration             ADL's   Supervision- Min A overall  Mod I  Activity tolerance, cognition, d/c planning   Mobility   modI bed mobility, min guard/close S gait with and without RW, limited by abdominal pain  modI overall  dynamic standing balance, activity tolerance, LE strengthening    Communication             Safety/Cognition/ Behavioral Observations  Min A,  limited by fatigue and pain  Supervision  problem solving and recall    Pain   Denies pain this shift.  Pain less than or equal to 2  Assess for pain and medicate prn   Skin   Skin is clean and dry. Incision to LUA graft with sutures intact. Dressing changes to LLQ peritoneal dialysis cath.   No skin breakdown while in rehab.  Assess skin q shift and prn.    Rehab Goals Patient on target to meet rehab goals: Yes (If he fully participates) *See Care Plan and progress notes for long and short-term goals.  Barriers to Discharge: decreased insight/awareness. dialysis question    Possible Resolutions to Barriers:  decide plan on HD. continued community reintegration, safety training    Discharge Planning/Teaching Needs:  Plan for pt to return home with his sister, Jared [name], who can provide intermittnet support.  She reports that pt's son is supposed to move into the home to assist as well.  TBD   Team Discussion:  C/o abd pain but better after BM this afternoon.  Pt changed back to PD per his request.  Only requires supervision when not in pain.  Pt very resistant to ST treatments and insists no cognitive difficulties.  Mod ind goals for PT/OT but may need supervision / oversight due to poor awareness.  Revisions to Treatment Plan:  None   Continued Need for Acute Rehabilitation Level of Care: The patient requires  daily medical management by a physician with specialized training in physical medicine and rehabilitation for the following conditions: Daily direction of a multidisciplinary physical rehabilitation program to ensure safe treatment while eliciting the highest outcome that is of practical value to the patient.: Yes Daily medical management of patient stability for increased activity during participation in an intensive rehabilitation regime.: Yes Daily analysis of laboratory values and/or radiology reports with any subsequent need for medication adjustment of medical intervention for :  Neurological problems;Renal problems;Mood/behavior problems  Camila Maita, [place] 12/05/2016, 4:32 PM

## 2016-12-05 NOTE — Progress Notes (Signed)
Subjective/Complaints: Still complaining of low abd pain. "hurts when I move". Is trying to eat.   ROS: pt denies nausea, vomiting, diarrhea, cough, shortness of breath or chest pain   Objective: Vital Signs: Blood pressure (!) 173/78, pulse 82, temperature 99.3 F (37.4 C), temperature source Oral, resp. rate 18, weight 77.3 kg (170 lb 6.7 oz), SpO2 97 %. Dg Abd 2 Views  Result Date: 12/04/2016 CLINICAL DATA:  Initial evaluation for acute abdominal pain. EXAM: ABDOMEN - 2 VIEW COMPARISON:  Prior radiograph from 08/10/2015. FINDINGS: Bowel gas pattern within normal limits without evidence for obstruction or ileus. No abnormal bowel wall thickening. No free air identified on lateral decubitus view. Moderate amount of retained stool within the colon. Peritoneal dialysis catheter in place, cord within the lower abdomen. No soft tissue mass or abnormal calcification. Prominent vascular calcifications noted within the pelvis. 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 No acute osseous abnormality. IMPRESSION: 1. Nonobstructive bowel gas pattern with no radiographic evidence for acute intra-abdominal process. 2. Moderate amount of retained stool within the colon, suggesting constipation. 3. Peritoneal dialysis catheter coiled within the lower abdomen. Electronically Signed   By: Jeannine Boga M.D.   On: 12/04/2016 13:10   Results for orders placed or performed during the hospital encounter of 12/01/16 (from the past 72 hour(s))  Glucose, capillary     Status: Abnormal   Collection Time: 12/02/16 11:22 AM  Result Value Ref Range   Glucose-Capillary 200 (H) 65 - 99 mg/dL  Glucose, capillary     Status: None   Collection Time: 12/02/16  4:53 PM  Result Value Ref Range   Glucose-Capillary 84 65 - 99 mg/dL  Glucose, capillary     Status: Abnormal   Collection Time: 12/02/16  8:44 PM  Result Value Ref Range   Glucose-Capillary 179 (H) 65 - 99 mg/dL  Glucose, capillary      Status: Abnormal   Collection Time: 12/03/16  3:28 AM  Result Value Ref Range   Glucose-Capillary 231 (H) 65 - 99 mg/dL  Glucose, capillary     Status: Abnormal   Collection Time: 12/03/16  5:56 AM  Result Value Ref Range   Glucose-Capillary 241 (H) 65 - 99 mg/dL  Glucose, capillary     Status: Abnormal   Collection Time: 12/03/16 11:37 AM  Result Value Ref Range   Glucose-Capillary 210 (H) 65 - 99 mg/dL  Glucose, capillary     Status: None   Collection Time: 12/03/16  5:02 PM  Result Value Ref Range   Glucose-Capillary 74 65 - 99 mg/dL  Glucose, capillary     Status: Abnormal   Collection Time: 12/03/16  8:43 PM  Result Value Ref Range   Glucose-Capillary 157 (H) 65 - 99 mg/dL  Glucose, capillary     Status: Abnormal   Collection Time: 12/04/16  3:58 AM  Result Value Ref Range   Glucose-Capillary 247 (H) 65 - 99 mg/dL  Glucose, capillary     Status: Abnormal   Collection Time: 12/04/16  6:56 AM  Result Value Ref Range   Glucose-Capillary 261 (H) 65 - 99 mg/dL  Glucose, capillary     Status: Abnormal   Collection Time: 12/04/16 11:30 AM  Result Value Ref Range   Glucose-Capillary 157 (H) 65 - 99 mg/dL   Comment 1 Notify RN   Glucose, capillary     Status: Abnormal   Collection Time: 12/04/16  5:50 PM  Result Value Ref Range   Glucose-Capillary 136 (H) 65 -  99 mg/dL   Comment 1 Notify RN   Glucose, capillary     Status: Abnormal   Collection Time: 12/04/16  8:49 PM  Result Value Ref Range   Glucose-Capillary 209 (H) 65 - 99 mg/dL   Comment 1 Notify RN   Glucose, capillary     Status: Abnormal   Collection Time: 12/05/16  2:36 AM  Result Value Ref Range   Glucose-Capillary 271 (H) 65 - 99 mg/dL   Comment 1 Notify RN   Glucose, capillary     Status: Abnormal   Collection Time: 12/05/16  5:15 AM  Result Value Ref Range   Glucose-Capillary 331 (H) 65 - 99 mg/dL   Comment 1 Notify RN      HEENT: normal Cardio: RRR Resp: CTA bil GI: BA+, NT to palpation. Still  slightly distended. Cath site ok.  Denies pain at cath Extremity:  Pulses positive and No Edema Skin:   Cath site intact.  Left arm incisions dressed  Neuro: alert.  Abnormal Motor 4/5 in BUE and BLE. No focal sensory deficits Musc/Skel:  Other no pain with UE or LE ROM Gen NAD Psych: anxious   Assessment/Plan: 1. Functional deficits secondary to debility due to multiple medical issues which require 3+ hours per day of interdisciplinary therapy in a comprehensive inpatient rehab setting. Physiatrist is providing close team supervision and 24 hour management of active medical problems listed below. Physiatrist and rehab team continue to assess barriers to discharge/monitor patient progress toward functional and medical goals. FIM: Function - Bathing Position: Wheelchair/chair at sink Body parts bathed by patient: Right arm, Left arm, Chest, Abdomen, Front perineal area, Buttocks, Right upper leg, Left upper leg, Right lower leg, Left lower leg Body parts bathed by helper: Back Assist Level: Supervision or verbal cues  Function- Upper Body Dressing/Undressing What is the patient wearing?: Pull over shirt/dress Pull over shirt/dress - Perfomed by patient: Thread/unthread right sleeve, Thread/unthread left sleeve, Put head through opening, Pull shirt over trunk Assist Level: Set up Set up : To obtain clothing/put away Function - Lower Body Dressing/Undressing What is the patient wearing?: Pants, Non-skid slipper socks Position: Sitting EOB Underwear - Performed by patient: Thread/unthread right underwear leg, Thread/unthread left underwear leg, Pull underwear up/down Pants- Performed by patient: Thread/unthread left pants leg, Pull pants up/down, Fasten/unfasten pants Pants- Performed by helper: Thread/unthread right pants leg Non-skid slipper socks- Performed by helper: Don/doff right sock, Don/doff left sock Socks - Performed by patient: Don/doff right sock, Don/doff left sock Shoes -  Performed by patient: Don/doff right shoe, Don/doff left shoe Shoes - Performed by helper: Fasten right, Fasten left Assist for footwear: Supervision/touching assist Assist for lower body dressing: Supervision or verbal cues  Function - Toileting Toileting steps completed by patient: Adjust clothing prior to toileting, Performs perineal hygiene, Adjust clothing after toileting Toileting Assistive Devices: Grab bar or rail Assist level: Supervision or verbal cues  Function - Toilet Transfers Toilet transfer assistive device: Grab bar Assist level to toilet: Touching or steadying assistance (Pt > 75%) Assist level from toilet: Touching or steadying assistance (Pt > 75%)  Function - Chair/bed transfer Chair/bed transfer method: Stand pivot Chair/bed transfer assist level: Supervision or verbal cues Chair/bed transfer assistive device: Armrests, Walker Chair/bed transfer details: Verbal cues for precautions/safety, Verbal cues for safe use of DME/AE  Function - Locomotion: Wheelchair Type: Manual Max wheelchair distance: 150 Assist Level: Supervision or verbal cues Assist Level: Supervision or verbal cues Wheel 150 feet activity did not occur: Safety/medical  concerns (per report) Assist Level: Supervision or verbal cues Turns around,maneuvers to table,bed, and toilet,negotiates 3% grade,maneuvers on rugs and over doorsills: No Function - Locomotion: Ambulation Assistive device: No device Max distance: 180 Assist level: Touching or steadying assistance (Pt > 75%) Assist level: Touching or steadying assistance (Pt > 75%) Assist level: Touching or steadying assistance (Pt > 75%) Walk 150 feet activity did not occur: Safety/medical concerns Assist level: Touching or steadying assistance (Pt > 75%) Walk 10 feet on uneven surfaces activity did not occur: Safety/medical concerns  Function - Comprehension Comprehension: Auditory Comprehension assist level: Understands basic 75 - 89% of  the time/ requires cueing 10 - 24% of the time  Function - Expression Expression: Verbal Expression assist level: Expresses basic 75 - 89% of the time/requires cueing 10 - 24% of the time. Needs helper to occlude trach/needs to repeat words.  Function - Social Interaction Social Interaction assist level: Interacts appropriately 75 - 89% of the time - Needs redirection for appropriate language or to initiate interaction.  Function - Problem Solving Problem solving assist level: Solves basic 50 - 74% of the time/requires cueing 25 - 49% of the time  Function - Memory Memory assist level: Recognizes or recalls 75 - 89% of the time/requires cueing 10 - 24% of the time Patient normally able to recall (first 3 days only): Staff names and faces, That he or she is in a hospital, Current season  Medical Problem List and Plan: 1. Functional and mobility deficits secondary to seizure and debility related to multiple medical issues -continue CIR PT, OT, SLP  -team conf today 2. DVT Prophylaxis/Anticoagulation: Pharmaceutical: Heparin 3. Pain Management: N/a 4. Mood: LCSW to follow for evaluation and support.  5. Neuropsych: This patient is not fully capable of making decisions on hisown behalf. 6. Skin/Wound Care: Maintain adequate nutrition and hydration status. Routine pressure relief measures.  7. Fluids/Electrolytes/Nutrition: Monitor I/O. Now on renal diet with 1200 cc FR.  -lab work with HD sessions 8. 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 No driving till follow up EEG /evaluation by neurology on outpatient basis.  9. ESRD: Has has been switched to HD on TTS pending neurologic recovery. Hope to transition to PD eventually in outpatient setting-- . -HD   scheduled after therapy day to accommodate rehab schedule 10 T2DM: BS still poorly controlled   -increase NPH to 20u bid Cont current regimen CBG (last 3)    Recent Labs  12/04/16 2049 12/05/16 0236 12/05/16 0515  GLUCAP 209* 271* 331*    11. HTN: Monitor BP bid--on amlodipine, hydralazine, metoprolol and losartan. Medications continue to be adjusted for tighter control. Losartan increase to 100mg  BID on 1/7 Vitals:   12/04/16 2108 12/05/16 0532  BP: (!) 162/79 (!) 173/78  Pulse: 85 82  Resp:  18  Temp:  99.3 F (37.4 C)   12. Anemia of chronic disease: H/H monitored with HD. On aranesp for supplement.  13. Abdominal pain: still some distention on exam, pain inconsistent. Nephrology thinks it's from cough  -still no BM since 1/4  -KUB with some retained stool  -sorbitol today  -suppository if needed  -encouraged him to work through as possible  LOS (Days) 4 A FACE TO Wingo T 12/05/2016, 8:25 AM

## 2016-12-05 NOTE — Progress Notes (Signed)
SLP Cancellation Note  Patient Details Name: Jared [name] MRN: [id number] DOB: 08-27-1956   Cancelled treatment:     Patient missed 30 minutes of skilled SLP intervention due to pain and fatigue. RN aware.                                                                                                  Georgenia Salim 12/05/2016, 2:06 PM

## 2016-12-05 NOTE — Progress Notes (Addendum)
[facility] Progress Note   Subjective: still coughing, no fevers.  BP's good off of losartan.  No abd pain c/o's today.  Vitals:   12/04/16 2108 12/05/16 0532 12/05/16 0856 12/05/16 1356  BP: (!) 162/79 (!) 173/78 (!) 154/89 (!) 142/84  Pulse: 85 82 80 84  Resp:  18    Temp:  99.3 F (37.4 C) 99.2 F (37.3 C) 98.7 F (37.1 C)  TempSrc:  Oral    SpO2:  97%  99%  Weight:  77.3 kg (170 lb 6.7 oz)      Inpatient medications: . amLODipine  10 mg Oral Daily  . calcitRIOL  0.5 mcg Oral Daily  . calcium carbonate  400 mg of elemental calcium Oral TID WC  . darbepoetin (ARANESP) injection - DIALYSIS  60 mcg Subcutaneous Q Tue-1800  . dialysis solution 1.5% low-MG/low-CA  100 mL Intraperitoneal Q24H  . dialysis solution 2.5% low-MG/low-CA   Intraperitoneal Q24H  . feeding supplement (NEPRO CARB STEADY)  237 mL Oral BID BM  . gentamicin cream  1 application Topical Daily  . heparin  5,000 Units Subcutaneous Q8H  . hydrALAZINE  100 mg Oral TID  . insulin aspart  0-20 Units Subcutaneous TID WC  . insulin aspart  0-5 Units Subcutaneous QHS  . insulin NPH Human  20 Units Subcutaneous BID AC & HS  . metoprolol tartrate  100 mg Oral BID  . multivitamin  1 tablet Oral QHS  . sevelamer carbonate  800 mg Oral TID WC    acetaminophen, alum & mag hydroxide-simeth, bisacodyl, diphenhydrAMINE, guaiFENesin-dextromethorphan, heparin, polyethylene glycol, prochlorperazine **OR** prochlorperazine **OR** prochlorperazine, traZODone  Exam: General: alert Ox3 , AAM  Lying in bed  Heart:RRR Lungs: CTA Abdomen:  bs pos , Soft NT, ND Extremities: no pedal edema Dialysis Access: pos bruit LUA AVF   Dialysis: CCPD  79kg   6 exch/ 24 hrs  2.5 L overnight vol and 2 L daytime vol, no pause.  Lab: Hb 12.9, pth 427      Assessment: 1. Seizures - due to ^^BS and/or HTN'sive enceph. BS and BP's better, no AED's at this time 2. HTN - on norvasc/ hydral/ metop. Dc'd losartan d/t nagging  cough. BP's good.  3. Abd pain - looks constipated by abd film yesterday. Plan > laxatives.  4. ESRD - PD pt was on temp HD but now alert and wants PD long-term. Resumed usual PD.   5. L AVF - 1/04 per IR, had thrombosed AVF underwent declot and stenting of draining cephalic vein (multifocal stenosis didn't respond to repeated venoplasty) 6. Anemia cont max ESA 7. DM on insulin 8. 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  Plan - laxatives, cont PD   Kelly Splinter MD [facility] pager [phone]   12/05/2016, 2:19 PM    Recent Labs Lab 11/29/16 0449 11/30/16 0526  NA 137 136  K 4.4 4.4  CL 100* 98*  CO2 27 26  GLUCOSE 173* 171*  BUN 34* 48*  CREATININE 6.57* 8.58*  CALCIUM 8.1* 8.4*  PHOS  --  4.7*    Recent Labs Lab 11/29/16 0449  AST 22  ALT 15*  ALKPHOS 52  BILITOT 0.3  PROT 5.9*  ALBUMIN 2.0*    Recent Labs Lab 11/30/16 0526  WBC 5.6  HGB 9.9*  HCT 30.7*  MCV 82.7  PLT 197   Iron/TIBC/Ferritin/ %Sat    Component Value Date/Time   IRON 78 11/28/2016 1546   TIBC 162 (  L) 11/28/2016 1546   IRONPCTSAT 48 (H) 11/28/2016 1546

## 2016-12-05 NOTE — Plan of Care (Signed)
Problem: RH PAIN MANAGEMENT Goal: RH STG PAIN MANAGED AT OR BELOW PT'S PAIN GOAL Pain less than 2  Outcome: Not Progressing Reports pain greater than 5

## 2016-12-06 ENCOUNTER — Inpatient Hospital Stay (HOSPITAL_COMMUNITY): Payer: Medicare Other | Admitting: Speech Pathology

## 2016-12-06 ENCOUNTER — Inpatient Hospital Stay (HOSPITAL_COMMUNITY): Payer: Medicare Other | Admitting: Physical Therapy

## 2016-12-06 ENCOUNTER — Inpatient Hospital Stay (HOSPITAL_COMMUNITY): Payer: Medicare Other | Admitting: Occupational Therapy

## 2016-12-06 LAB — GLUCOSE, CAPILLARY
GLUCOSE-CAPILLARY: 201 mg/dL — AB (ref 65–99)
GLUCOSE-CAPILLARY: 92 mg/dL (ref 65–99)
GLUCOSE-CAPILLARY: 55 mg/dL — AB (ref 65–99)
GLUCOSE-CAPILLARY: 66 mg/dL (ref 65–99)
Glucose-Capillary: 210 mg/dL — ABNORMAL HIGH (ref 65–99)
Glucose-Capillary: 41 mg/dL — CL (ref 65–99)
Glucose-Capillary: 63 mg/dL — ABNORMAL LOW (ref 65–99)
Glucose-Capillary: 78 mg/dL (ref 65–99)
Glucose-Capillary: 83 mg/dL (ref 65–99)
Glucose-Capillary: 90 mg/dL (ref 65–99)

## 2016-12-06 MED ORDER — INSULIN NPH (HUMAN) (ISOPHANE) 100 UNIT/ML ~~LOC~~ SUSP
20.0000 [IU] | Freq: Two times a day (BID) | SUBCUTANEOUS | Status: DC
Start: 2016-12-06 — End: 2016-12-07
  Administered 2016-12-06: 20 [IU] via SUBCUTANEOUS

## 2016-12-06 NOTE — Progress Notes (Signed)
Occupational Therapy Session Note  Patient Details  Name: Jared [name] MRN: [id number] Date of Birth: 05/06/1956  Today's Date: 12/06/2016 OT Individual Time: 1045-1200 OT Individual Time Calculation (min): 75 min  OT Missed Time: 30 min ( fatigue)    Short Term Goals:Week 1:  OT Short Term Goal 1 (Week 1): Pt will complete toilet transfer and LRAD with Min A OT Short Term Goal 2 (Week 1): Pt will complete sit<stand for LB ADLs with  Min A OT Short Term Goal 3 (Week 1): Pt will complete tub bench transfer with Min A and LRAD OT Short Term Goal 4 (Week 1): Pt will ambulate into bathroom for bathroom transfers with LRAD  Skilled Therapeutic Interventions/Progress Updates:    Session One: Pt seen for OT ADL bathing/dressing session. Pt sitting EOB upon arrival, voicing complaints of abdominal pain and discussing with therapist his worry of his abdominal pain being something more serious. Empathetic listening and support provided. Pt agreeable to shower as that made him feel better yesterday. He required mod A to stand and ambulated without AD with close supervision. Pt bathed seated on tub bench standing with heavy reliance on grab bars for balance to complete buttock hygiene.  He ambulated back out of room and dressed seated in chair. Grooming completed standing at sink with distant supervision. . Pt voiced feeling "amazingly well" following shower with no further complaints of abdominal pain and completeing ambulation and transfers with supervision. Pt stating "maybe the Dr. Was right, maybe the laxative is all I needed".  In ADL apartment, pt completed simluated tub/shower combination with guarding assist following demonstration to shower chair. Pt stated he had shower stall at home, and recommended this for increased safety and independence at home.  Discussed with pt possible need for AD for ambulation as pt occasionally requires assist for sit <> stand and with decreased ambulation speed  without AD. Trialed SPC, ambulated throughout unit with supervision. One LOB episode when crossing threshold requiring min A to regain balance. Pt returned to room and upon return to room pt becoming more agitated after being told he was not allowed to ambulate without assist from nursing/ therapy staff, upset about messy room, etc. Support and education provided. Plan to assist pt with cleaning room during PM session, however, pt remained agitated. Left seated in recliner with all needs in reach, RN made aware of pt's behavior.   Session Two: OT arrived at 1400 for scheduled therapy session. Pt asleep in sidelying, easily awoken, however, voiced increased fatigue stating " I think we pushed it a little too much in previous sessions". Encouragement and education provided for benefits of OOB, however, pt cont to decline. Pt declined offer for OT to return later in P.M. Left in bed with all needs in reach.   Therapy Documentation Precautions:  Precautions Precautions: Fall Precaution Comments: peritoneal access abdomen Restrictions Weight Bearing Restrictions: No ADL: ADL ADL Comments: Please see functional navigator for ADL status  See Function Navigator for Current Functional Status.   Therapy/Group: Individual Therapy  [name], Skylier Kretschmer C 12/06/2016, 7:07 AM

## 2016-12-06 NOTE — Progress Notes (Signed)
Subjective/Complaints: In bed. Eyes closed. States he feels better. Moved bowels yesterday. PD still running  ROS: pt denies nausea, vomiting, diarrhea, cough, shortness of breath or chest pain   Objective: Vital Signs: Blood pressure (!) 165/82, pulse 81, temperature 99 F (37.2 C), temperature source Oral, resp. rate 17, weight 79.7 kg (175 lb 11.3 oz), SpO2 97 %. Dg Abd 2 Views  Result Date: 12/04/2016 CLINICAL DATA:  Initial evaluation for acute abdominal pain. EXAM: ABDOMEN - 2 VIEW COMPARISON:  Prior radiograph from 08/10/2015. FINDINGS: Bowel gas pattern within normal limits without evidence for obstruction or ileus. No abnormal bowel wall thickening. No free air identified on lateral decubitus view. Moderate amount of retained stool within the colon. Peritoneal dialysis catheter in place, cord within the lower abdomen. No soft tissue mass or abnormal calcification. Prominent vascular calcifications noted within the pelvis. 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 No acute osseous abnormality. IMPRESSION: 1. Nonobstructive bowel gas pattern with no radiographic evidence for acute intra-abdominal process. 2. Moderate amount of retained stool within the colon, suggesting constipation. 3. Peritoneal dialysis catheter coiled within the lower abdomen. Electronically Signed   By: Jeannine Boga M.D.   On: 12/04/2016 13:10   Results for orders placed or performed during the hospital encounter of 12/01/16 (from the past 72 hour(s))  Glucose, capillary     Status: Abnormal   Collection Time: 12/03/16 11:37 AM  Result Value Ref Range   Glucose-Capillary 210 (H) 65 - 99 mg/dL  Glucose, capillary     Status: None   Collection Time: 12/03/16  5:02 PM  Result Value Ref Range   Glucose-Capillary 74 65 - 99 mg/dL  Glucose, capillary     Status: Abnormal   Collection Time: 12/03/16  8:43 PM  Result Value Ref Range   Glucose-Capillary 157 (H) 65 - 99 mg/dL  Glucose,  capillary     Status: Abnormal   Collection Time: 12/04/16  3:58 AM  Result Value Ref Range   Glucose-Capillary 247 (H) 65 - 99 mg/dL  Glucose, capillary     Status: Abnormal   Collection Time: 12/04/16  6:56 AM  Result Value Ref Range   Glucose-Capillary 261 (H) 65 - 99 mg/dL  Glucose, capillary     Status: Abnormal   Collection Time: 12/04/16 11:30 AM  Result Value Ref Range   Glucose-Capillary 157 (H) 65 - 99 mg/dL   Comment 1 Notify RN   Glucose, capillary     Status: Abnormal   Collection Time: 12/04/16  5:50 PM  Result Value Ref Range   Glucose-Capillary 136 (H) 65 - 99 mg/dL   Comment 1 Notify RN   Glucose, capillary     Status: Abnormal   Collection Time: 12/04/16  8:49 PM  Result Value Ref Range   Glucose-Capillary 209 (H) 65 - 99 mg/dL   Comment 1 Notify RN   Glucose, capillary     Status: Abnormal   Collection Time: 12/05/16  2:36 AM  Result Value Ref Range   Glucose-Capillary 271 (H) 65 - 99 mg/dL   Comment 1 Notify RN   Glucose, capillary     Status: Abnormal   Collection Time: 12/05/16  5:15 AM  Result Value Ref Range   Glucose-Capillary 331 (H) 65 - 99 mg/dL   Comment 1 Notify RN   Glucose, capillary     Status: Abnormal   Collection Time: 12/05/16 11:58 AM  Result Value Ref Range   Glucose-Capillary 178 (H) 65 - 99 mg/dL  Glucose, capillary     Status: Abnormal   Collection Time: 12/05/16  4:47 PM  Result Value Ref Range   Glucose-Capillary 60 (L) 65 - 99 mg/dL  Glucose, capillary     Status: None   Collection Time: 12/05/16  5:16 PM  Result Value Ref Range   Glucose-Capillary 88 65 - 99 mg/dL  Glucose, capillary     Status: Abnormal   Collection Time: 12/05/16  8:37 PM  Result Value Ref Range   Glucose-Capillary 160 (H) 65 - 99 mg/dL   Comment 1 Notify RN   Glucose, capillary     Status: Abnormal   Collection Time: 12/06/16  3:02 AM  Result Value Ref Range   Glucose-Capillary 210 (H) 65 - 99 mg/dL   Comment 1 Notify RN   Glucose, capillary      Status: Abnormal   Collection Time: 12/06/16  5:26 AM  Result Value Ref Range   Glucose-Capillary 201 (H) 65 - 99 mg/dL   Comment 1 Notify RN      HEENT: normal Cardio: RRR Resp: CTA bil GI: NT, BS+ Extremity:  Pulses positive and No Edema Skin:   Skin clean, intact  Neuro: alert.  Abnormal Motor 4/5 in BUE and BLE. No focal sensory deficits Musc/Skel:  Other no pain with UE or LE ROM Gen NAD Psych: anxious   Assessment/Plan: 1. Functional deficits secondary to debility due to multiple medical issues which require 3+ hours per day of interdisciplinary therapy in a comprehensive inpatient rehab setting. Physiatrist is providing close team supervision and 24 hour management of active medical problems listed below. Physiatrist and rehab team continue to assess barriers to discharge/monitor patient progress toward functional and medical goals. FIM: Function - Bathing Position: Shower Body parts bathed by patient: Right arm, Left arm, Chest, Abdomen, Front perineal area, Buttocks, Right upper leg, Left upper leg, Right lower leg, Left lower leg Body parts bathed by helper: Back Assist Level: Supervision or verbal cues, Set up Set up : To obtain items, To adjust water temperature  Function- Upper Body Dressing/Undressing What is the patient wearing?: Pull over shirt/dress Pull over shirt/dress - Perfomed by patient: Thread/unthread right sleeve, Thread/unthread left sleeve, Put head through opening, Pull shirt over trunk Assist Level: Set up Set up : To obtain clothing/put away Function - Lower Body Dressing/Undressing What is the patient wearing?: Pants, Non-skid slipper socks, Underwear Position: Wheelchair/chair at sink Underwear - Performed by patient: Thread/unthread right underwear leg, Thread/unthread left underwear leg, Pull underwear up/down Pants- Performed by patient: Thread/unthread left pants leg, Pull pants up/down, Fasten/unfasten pants, Thread/unthread right pants  leg Pants- Performed by helper: Thread/unthread right pants leg Non-skid slipper socks- Performed by patient: Don/doff right sock, Don/doff left sock Non-skid slipper socks- Performed by helper: Don/doff right sock, Don/doff left sock Socks - Performed by patient: Don/doff right sock, Don/doff left sock Shoes - Performed by patient: Don/doff right shoe, Don/doff left shoe Shoes - Performed by helper: Fasten right, Fasten left Assist for footwear: Supervision/touching assist Assist for lower body dressing: Touching or steadying assistance (Pt > 75%)  Function - Toileting Toileting steps completed by patient: Adjust clothing prior to toileting, Performs perineal hygiene, Adjust clothing after toileting Toileting Assistive Devices: Grab bar or rail Assist level: Supervision or verbal cues  Function - Air cabin crew transfer assistive device: Grab bar Assist level to toilet: Supervision or verbal cues Assist level from toilet: Touching or steadying assistance (Pt > 75%)  Function - Chair/bed transfer Chair/bed transfer method:  Stand pivot Chair/bed transfer assist level: Supervision or verbal cues Chair/bed transfer assistive device: Armrests, Walker Chair/bed transfer details: Verbal cues for precautions/safety, Verbal cues for safe use of DME/AE  Function - Locomotion: Wheelchair Type: Manual Max wheelchair distance: 150 Assist Level: Supervision or verbal cues Assist Level: Supervision or verbal cues Wheel 150 feet activity did not occur: Safety/medical concerns (per report) Assist Level: Supervision or verbal cues Turns around,maneuvers to table,bed, and toilet,negotiates 3% grade,maneuvers on rugs and over doorsills: No Function - Locomotion: Ambulation Assistive device: No device Max distance: 180 Assist level: Touching or steadying assistance (Pt > 75%) Assist level: Touching or steadying assistance (Pt > 75%) Assist level: Touching or steadying assistance (Pt >  75%) Walk 150 feet activity did not occur: Safety/medical concerns Assist level: Touching or steadying assistance (Pt > 75%) Walk 10 feet on uneven surfaces activity did not occur: Safety/medical concerns  Function - Comprehension Comprehension: Auditory Comprehension assist level: Understands basic 75 - 89% of the time/ requires cueing 10 - 24% of the time  Function - Expression Expression: Verbal Expression assist level: Expresses basic 75 - 89% of the time/requires cueing 10 - 24% of the time. Needs helper to occlude trach/needs to repeat words.  Function - Social Interaction Social Interaction assist level: Interacts appropriately 75 - 89% of the time - Needs redirection for appropriate language or to initiate interaction.  Function - Problem Solving Problem solving assist level: Solves basic 75 - 89% of the time/requires cueing 10 - 24% of the time  Function - Memory Memory assist level: Recognizes or recalls 50 - 74% of the time/requires cueing 25 - 49% of the time Patient normally able to recall (first 3 days only): Staff names and faces, That he or she is in a hospital, Current season  Medical Problem List and Plan: 1. Functional and mobility deficits secondary to seizure and debility related to multiple medical issues -continue CIR PT, OT, SLP  -working toward discharge 2. DVT Prophylaxis/Anticoagulation: Pharmaceutical: Heparin 3. Pain Management: N/a 4. Mood: LCSW to follow for evaluation and support.  5. Neuropsych: This patient is not fully capable of making decisions on hisown behalf. 6. Skin/Wound Care: Maintain adequate nutrition and hydration status. Routine pressure relief measures.  7. Fluids/Electrolytes/Nutrition: Monitor I/O. Now on renal diet with 1200 cc FR.  -lab work with HD sessions 8. 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 No driving till follow up EEG /evaluation by neurology on outpatient  basis.  9. ESRD: Has has been switched to HD on TTS pending neurologic recovery. Hope to transition to PD eventually in outpatient setting-- . -HD   scheduled after therapy day to accommodate rehab schedule 10 T2DM: BS still poorly controlled   -increased NPH to 20u bid yesterday after increase day before---follow for pattern today before further increase   CBG (last 3)   Recent Labs  12/05/16 2037 12/06/16 0302 12/06/16 0526  GLUCAP 160* 210* 201*    11. HTN: Monitor BP bid--on amlodipine, hydralazine, metoprolol and losartan. Medications continue to be adjusted for tighter control. Losartan increase to 100mg  BID on 1/7 Vitals:   12/05/16 1356 12/06/16 0604  BP: (!) 142/84 (!) 165/82  Pulse: 84 81  Resp:  17  Temp: 98.7 F (37.1 C) 99 F (37.2 C)   12. Anemia of chronic disease: H/H monitored with HD. On aranesp for supplement.  13. Abdominal pain: still some distention on exam, pain inconsistent. Nephrology thinks it's from cough  -suspect it  was related to constipation. Improved today  -behavioral component  -bowel prophylaxis   LOS (Days) 5 A FACE TO FACE EVALUATION WAS PERFORMED  Evadna Donaghy T 12/06/2016, 9:11 AM

## 2016-12-06 NOTE — Progress Notes (Signed)
  [facility] Progress Note   Subjective: good BM after laxative.   Vitals:   12/05/16 0532 12/05/16 0856 12/05/16 1356 12/06/16 0604  BP: (!) 173/78 (!) 154/89 (!) 142/84 (!) 165/82  Pulse: 82 80 84 81  Resp: 18   17  Temp: 99.3 F (37.4 C) 99.2 F (37.3 C) 98.7 F (37.1 C) 99 F (37.2 C)  TempSrc: Oral   Oral  SpO2: 97%  99% 97%  Weight: 77.3 kg (170 lb 6.7 oz)   79.7 kg (175 lb 11.3 oz)    Inpatient medications: . amLODipine  10 mg Oral Daily  . calcitRIOL  0.5 mcg Oral Daily  . calcium carbonate  400 mg of elemental calcium Oral TID WC  . darbepoetin (ARANESP) injection - DIALYSIS  60 mcg Subcutaneous Q Tue-1800  . dialysis solution 1.5% low-MG/low-CA  100 mL Intraperitoneal Q24H  . dialysis solution 2.5% low-MG/low-CA   Intraperitoneal Q24H  . feeding supplement (NEPRO CARB STEADY)  237 mL Oral BID BM  . gentamicin cream  1 application Topical Daily  . heparin  5,000 Units Subcutaneous Q8H  . hydrALAZINE  100 mg Oral TID  . insulin aspart  0-20 Units Subcutaneous TID WC  . insulin aspart  0-5 Units Subcutaneous QHS  . insulin NPH Human  20 Units Subcutaneous BID AC & HS  . metoprolol tartrate  100 mg Oral BID  . multivitamin  1 tablet Oral QHS  . senna-docusate  2 tablet Oral QHS  . sevelamer carbonate  800 mg Oral TID WC  . sorbitol  30 mL Oral Once    acetaminophen, alum & mag hydroxide-simeth, bisacodyl, diphenhydrAMINE, guaiFENesin-dextromethorphan, heparin, prochlorperazine **OR** prochlorperazine **OR** prochlorperazine, traZODone  Exam: General: alert Ox3 , AAM  Lying in bed  Heart:RRR Lungs: CTA Abdomen:  bs pos , Soft NT, ND Extremities: no pedal edema Dialysis Access: pos bruit LUA AVF   Dialysis: CCPD  79kg   6 exch/ 24 hrs  2.5 L overnight vol and 2 L daytime vol, no pause.  Lab: Hb 12.9, pth 427      Assessment: 1. Seizures - due to ^^BS and/or HTN'sive enceph. BS and BP's better, no AED's at this time 2. HTN - on norvasc/  hydral/ metop. Dc'd losartan d/t nagging cough. BP's good.  3. Abd pain / constipation - better 4. ESRD - PD pt was on temp HD but now back on PD.  5. L AVF - 1/04 per IR, had thrombosed AVF underwent declot and stenting of draining cephalic vein (multifocal stenosis didn't respond to repeated venoplasty) 6. Anemia cont max ESA 7. DM on insulin 8. 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    Kelly Splinter MD [employer] pager [phone]   12/06/2016, 11:20 AM    Recent Labs Lab 11/30/16 0526  NA 136  K 4.4  CL 98*  CO2 26  GLUCOSE 171*  BUN 48*  CREATININE 8.58*  CALCIUM 8.4*  PHOS 4.7*   No results for input(s): AST, ALT, ALKPHOS, BILITOT, PROT, ALBUMIN in the last 168 hours.  Recent Labs Lab 11/30/16 0526  WBC 5.6  HGB 9.9*  HCT 30.7*  MCV 82.7  PLT 197   Iron/TIBC/Ferritin/ %Sat    Component Value Date/Time   IRON 78 11/28/2016 1546   TIBC 162 (L) 11/28/2016 1546   IRONPCTSAT 48 (H) 11/28/2016 1546

## 2016-12-06 NOTE — Progress Notes (Signed)
Physical Therapy Session Note  Patient Details  Name: Jared [name] MRN: [id number] Date of Birth: [birth year]  Today's Date: 12/06/2016 PT Individual Time: 1300-1405 PT Individual Time Calculation (min): 65 min    Short Term Goals: Week 1:  PT Short Term Goal 1 (Week 1): STG=LTG  Skilled Therapeutic Interventions/Progress Updates:   Pt received supine in bed, reports pain "better" than yesterday but does not rate. Gait to/from gym with [facility] and S. Performed nustep x10 min with BUE/BLE for strengthening and endurance. Gait in community setting on level/unlevel surfaces, in gift shop and through crowded hospital hallways to prepare for d/c home and community reintegration. Pt fatigues quickly and requires occasional seated rest breaks during ambulation trial. Upon return to unit pt very fatigued, requesting to return to room. After several minute rest break pt agreeable to perform seated LE strengthening exercises. Pt engaged briefly in LE exercises before reporting increase in abdominal pain. Pt returned to bed with S, removed socks and shoes without assist and remained supine in bed at end of session, alarm intact and all needs in reach.   Therapy Documentation Precautions:  Precautions Precautions: Fall Precaution Comments: peritoneal access abdomen Restrictions Weight Bearing Restrictions: No  General: PT Amount of Missed Time (min): 10 Minutes PT Missed Treatment Reason: Pain   See Function Navigator for Current Functional Status.   Therapy/Group: Individual Therapy  Luberta Mutter 12/06/2016, 2:06 PM

## 2016-12-06 NOTE — Significant Event (Signed)
Hypoglycemic Event  CBG: 63  Treatment: 15 GM carbohydrate snack  Symptoms: None  Follow-up CBG: Time:1300 CBG Result:92  Possible Reasons for Event: Unknown  Comments/MD notified:Pam Love, PA-C notified. Juice and crackers given. Lunch arrived and patient eating.    Creig Hines, Susa Raring

## 2016-12-06 NOTE — Progress Notes (Signed)
Social Work Patient ID: Jared [name], male   DOB: 1956-02-12, 61 y.o.   MRN: [id number]   Have spoken with pt's sister, [name] and with pt to review team conference.  Both aware and agreeable with targeted d/c date of 1/13 with mod ind/ supervision goals.  Plan still for pt to d/c home with sister and his son is to provide any needed assistance when sister not available.  Pt will continue with PD at home.    [name], LCSW

## 2016-12-06 NOTE — Progress Notes (Signed)
Speech Language Pathology Daily Session Note  Patient Details  Name: Jared [name] MRN: [id number] Date of Birth: 11/22/1956  Today's Date: 12/06/2016 SLP Individual Time: 1015-1050 SLP Individual Time Calculation (min): 35 min   Short Term Goals: Week 1: SLP Short Term Goal 1 (Week 1): Patient will demonstrate functional problem solving for complex tasks with supervision.  SLP Short Term Goal 2 (Week 1): Patient will recall new, daily informaiton with supervision.  SLP Short Term Goal 3 (Week 1): Patient will demonstrate selective attention to tasks for 30 minutes with supervision.   Skilled Therapeutic Interventions: Skilled treatment session focused on cognition goals. SLP facilitated session by providing Max A verbal cues for medication management task. Pt demonstrated poor activity toleration and was easily frustrated by task. Pt threw pills onto table and stated "I am so f+++ tired of this sh++, I am a grown man will not do any more this stupid sh++." SLP provided encouragement for task completion and pt pointed his finger at SLP and stated "I won't tell you again." Session discontinued and pt left in bed with alarm on. All needs were within reach. Continue current plan of care.    Function:    Cognition Comprehension Comprehension assist level: Understands basic 75 - 89% of the time/ requires cueing 10 - 24% of the time  Expression   Expression assist level: Expresses basic 75 - 89% of the time/requires cueing 10 - 24% of the time. Needs helper to occlude trach/needs to repeat words.  Social Interaction Social Interaction assist level: Interacts appropriately 75 - 89% of the time - Needs redirection for appropriate language or to initiate interaction.  Problem Solving Problem solving assist level: Solves basic 50 - 74% of the time/requires cueing 25 - 49% of the time  Memory Memory assist level: Recognizes or recalls 50 - 74% of the time/requires cueing 25 - 49% of the time     Pain    Therapy/Group: Individual Therapy  Tenise Stetler 12/06/2016, 1:35 PM

## 2016-12-07 ENCOUNTER — Inpatient Hospital Stay (HOSPITAL_COMMUNITY): Payer: Medicare Other | Admitting: *Deleted

## 2016-12-07 ENCOUNTER — Inpatient Hospital Stay (HOSPITAL_COMMUNITY): Payer: Medicare Other | Admitting: Speech Pathology

## 2016-12-07 ENCOUNTER — Inpatient Hospital Stay (HOSPITAL_COMMUNITY): Payer: Medicare Other | Admitting: Occupational Therapy

## 2016-12-07 ENCOUNTER — Inpatient Hospital Stay (HOSPITAL_COMMUNITY): Payer: Medicare Other | Admitting: Physical Therapy

## 2016-12-07 LAB — GLUCOSE, CAPILLARY
GLUCOSE-CAPILLARY: 237 mg/dL — AB (ref 65–99)
GLUCOSE-CAPILLARY: 62 mg/dL — AB (ref 65–99)
GLUCOSE-CAPILLARY: 248 mg/dL — AB (ref 65–99)
Glucose-Capillary: 287 mg/dL — ABNORMAL HIGH (ref 65–99)
Glucose-Capillary: 125 mg/dL — ABNORMAL HIGH (ref 65–99)
Glucose-Capillary: 96 mg/dL (ref 65–99)

## 2016-12-07 MED ORDER — INSULIN GLARGINE 100 UNIT/ML ~~LOC~~ SOLN: 5.0000 [IU] | Freq: Every day | SUBCUTANEOUS | Status: DC

## 2016-12-07 MED ORDER — INSULIN NPH (HUMAN) (ISOPHANE) 100 UNIT/ML ~~LOC~~ SUSP
15.0000 [IU] | Freq: Every day | SUBCUTANEOUS | Status: DC
  Administered 2016-12-07: 15 [IU] via SUBCUTANEOUS

## 2016-12-07 MED ORDER — INSULIN NPH (HUMAN) (ISOPHANE) 100 UNIT/ML ~~LOC~~ SUSP
15.0000 [IU] | Freq: Every day | SUBCUTANEOUS | Status: DC
Start: 2016-12-08 — End: 2016-12-09
  Administered 2016-12-08 – 2016-12-09 (×2): 15 [IU] via SUBCUTANEOUS

## 2016-12-07 MED ORDER — INSULIN NPH (HUMAN) (ISOPHANE) 100 UNIT/ML ~~LOC~~ SUSP: 15.0000 [IU] | Freq: Every day | SUBCUTANEOUS | Status: DC

## 2016-12-07 MED ORDER — DARBEPOETIN ALFA 60 MCG/0.3ML IJ SOSY: 60.0000 ug | PREFILLED_SYRINGE | INTRAMUSCULAR | Status: DC

## 2016-12-07 MED ORDER — INSULIN NPH (HUMAN) (ISOPHANE) 100 UNIT/ML ~~LOC~~ SUSP
5.0000 [IU] | Freq: Every day | SUBCUTANEOUS | Status: DC
  Administered 2016-12-07: 5 [IU] via SUBCUTANEOUS

## 2016-12-07 NOTE — Progress Notes (Signed)
SLP Cancellation Note  Patient Details Name: Jakeel Starliper MRN: [id number] DOB: [birth year]   Cancelled treatment:       Patient missed 45 minutes of skilled SLP intervention due to pain and fatigue. RN aware.                                                                                                Daisha Filosa, [place] 12/07/2016, 3:52 PM

## 2016-12-07 NOTE — Progress Notes (Signed)
Occupational Therapy Session Note  Patient Details  Name: Jared [name] MRN: [id number] Date of Birth: Apr 01, 1956  Today's Date: 12/07/2016 OT Individual Time: 1100-1123 and 1420-1500 OT Individual Time Calculation (min): 23 min and 40 min OT Missed Time: 27 min (fatigue)    Short Term Goals:Week 1:  OT Short Term Goal 1 (Week 1): Pt will complete toilet transfer and LRAD with Min A OT Short Term Goal 2 (Week 1): Pt will complete sit<stand for LB ADLs with  Min A OT Short Term Goal 3 (Week 1): Pt will complete tub bench transfer with Min A and LRAD OT Short Term Goal 4 (Week 1): Pt will ambulate into bathroom for bathroom transfers with LRAD  Skilled Therapeutic Interventions/Progress Updates:    Session One: Pt seen for OT  Session focusing on education and encouragement.  Pt received sitting EOB with recreational therapist present finishing session. Pt still connected to peritoneal dialysis though this was disconnected shortly after therapist arrival. Pt cont with complaints of abdominal pain, RN already aware.  Cont with education regarding CIR, OT services, POC, OT goals, benefits of mobility, and d/c planning. Pt becoming slightly agitated stating no one is listening to him or understands and "you can't expect me to move when I'm in this much pain". Empathetic listening and support provided. Pt refusing to get up at this time stating he needed longer to rest. Left pt in supine with all needs in reach.   Session Two: Pt seen for OT session focusing on ADL re-training. Pt in supine upon arrival, with increased time and encouragement pt willing to complete bathing/dressing. Pt cont with complaints of abdominal pain this session. Initially wanted to shower, however, changed my mind and completed bathing seated in chair at sink. He stood to complete clothing management with LOB episode requiring min A to regain balance. Recommended pt sit to thread LEs into pants, however, pt unreceptive to  education stating "I'm just trying to get this done".   Pt returned to recliner at end of session, left seated with all needs in reach and reviewed need for assist with all mobility when desiring to return to bed.   Therapy Documentation Precautions:  Precautions Precautions: Fall Precaution Comments: peritoneal access abdomen Restrictions Weight Bearing Restrictions: No ADL: ADL ADL Comments: Please see functional navigator for ADL status  See Function Navigator for Current Functional Status.   Therapy/Group: Individual Therapy  [name], Tiberius Loftus C 12/07/2016, 7:13 AM

## 2016-12-07 NOTE — Progress Notes (Signed)
Recreational Therapy Session Note  Patient Details  Name: Jared [name] MRN: [id number] Date of Birth: [birth year] Today's Date: 12/07/2016  Attempted eval completion at bedside, but pt with c/o abdominal pain, hiccups off and on & now burning in chest.  RN made aware.    Brief education provided about importance of leisure and it's effect on overall health & wellness.  Pt stated agreement/understaning with intentions to participate in previously enjoyed leisure activities when able.  Full eval deferred as pt is scheduled for discharge 1/13.  [name] 12/07/2016, 11:52 AM

## 2016-12-07 NOTE — Progress Notes (Signed)
Physical Therapy Session Note  Patient Details  Name: Jared [name] MRN: [id number] Date of Birth: [birth year]  Today's Date: 12/07/2016 PT Individual Time: 1300-1330 PT Individual Time Calculation (min): 30 min    Short Term Goals: Week 1:  PT Short Term Goal 1 (Week 1): STG=LTG  Skilled Therapeutic Interventions/Progress Updates:   Pt received supine in bed, c/o 9/10 pain in abdomen but agreeable to treatment. Supine>sit modI. ModI sit >stand and gait within room to sink. Pt seated at sink and completed grooming modI. Gait to/from gym x150' each with SPC and S, slow speed. Performed ascent/descent of 3" and 6" stairs with 1 handrail and SPC; 12 stairs each trial x2 trials with seated rest break between due to fatigue. Requires S on stairs, and correct placement of SPC without cues. Returned to room with gait as above. Discussed tomorrow's therapy session to include reassessment of all mobility tasks; discussed with pt goal of later start to improve pt compliance and pt agreeable to participate. Remained seated in recliner at end of session, all needs in reach.   Therapy Documentation Precautions:  Precautions Precautions: Fall Precaution Comments: peritoneal access abdomen Restrictions Weight Bearing Restrictions: No   See Function Navigator for Current Functional Status.   Therapy/Group: Individual Therapy  Luberta Mutter 12/07/2016, 1:23 PM

## 2016-12-07 NOTE — Significant Event (Signed)
sHypoglycemic Event  CBG:55  Treatment: 15 GM carbohydrate snack  Symptoms: Vision changes  Follow-up CBG: Time: 23:17 CBG Result:66  Possible Reasons for Event: Medication regimen: .  Comments/MD notified:Possibly Lantus (dinner time) causing night time hypoglycemic events and NPH causing day time hypoglycemic events    Allyne Hebert O Crysten Kaman

## 2016-12-07 NOTE — Progress Notes (Signed)
Physical Therapy Session Note  Patient Details  Name: Jared [name] MRN: [id number] Date of Birth: 1955/12/16  Today's Date: 12/07/2016   Skilled Therapeutic Interventions/Progress Updates:   Pt received in bed connected to peritoneal dialysis; reporting he cannot participate in therapy as the machine is very sensitive. Discussed with RN who called dialysis coordinator; reports pt cannot participate while on dialysis and recommends scheduling therapy later in the morning. Therapist notified scheduling coordinator. Will continue to follow as schedule allows.   Therapy Documentation General: PT Amount of Missed Time (min): 82 Minutes PT Missed Treatment Reason: Unavailable (Comment) (peritoneal dialysis)   See Function Navigator for Current Functional Status.    Benjiman Core Tygielski 12/07/2016, 9:24 AM

## 2016-12-07 NOTE — Significant Event (Signed)
Hypoglycemic Event  CBG: 41  Treatment: 15 GM carbohydrate snack  Symptoms: Vision changes  Follow-up CBG: Time:22:51 CBG Result:55  Possible Reasons for Event: Unknown  Comments/MD notified:Juice and crackers with peanut butter provided    Donta Mcinroy O Marajade Lei

## 2016-12-07 NOTE — Progress Notes (Signed)
Social Work Patient ID: Jared [name], male   DOB: June 03, 1956, 61 y.o.   MRN: [id number]  Lowella Curb, LCSW Social Worker Signed   Patient Care Conference Date of Service: 12/05/2016  4:32 PM      Hide copied text Hover for attribution information Inpatient RehabilitationTeam Conference and Plan of Care Update Date: 12/05/2016   Time: 2:05 PM      Patient Name: Jared [name]      Medical Record Number: [id number]  Date of Birth: 10-Jul-1956 Sex: Male         Room/Bed: 4M03C/4M03C-01 Payor Info: Payor: MEDICARE / Plan: MEDICARE PART A AND B / Product Type: *No Product type* /     Admitting Diagnosis: Debility  Admit Date/Time:  12/01/2016  3:41 PM Admission Comments: No comment available    Primary Diagnosis:  Encephalopathy Principal Problem: Encephalopathy       Patient Active Problem List    Diagnosis Date Noted  . Encephalopathy 12/01/2016  . Hypertension    . CKD (chronic kidney disease) stage V requiring chronic dialysis ([place])    . Acute respiratory failure with hypoxia ([place])    . Seizure ([place]) 11/24/2016  . Status epilepticus [facility])        Expected Discharge Date: Expected Discharge Date: 12/09/16   Team Members Present: Physician leading conference: Dr. Alger Simons Social Worker Present: Lennart Pall, LCSW Nurse Present: Heather Roberts, RN PT Present: Kem Parkinson, Rachel Moulds, PT OT Present: Napoleon Form, OT SLP Present: Weston Anna, SLP PPS Coordinator present : Daiva Nakayama, RN, CRRN       Current Status/Progress Goal Weekly Team Focus  Medical   debility/encephalopathy related to seizure/multiple medical. 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 Patient is oliguric.   Remain continent   Monitor for changes in bowel and bladder.    Swallow/Nutrition/ Hydration             ADL's   Supervision- Min A overall  Mod I  Activity tolerance, cognition, d/c planning    Mobility   modI bed mobility, min guard/close S gait with and without RW, limited by abdominal pain  modI overall  dynamic standing balance, activity tolerance, LE strengthening    Communication             Safety/Cognition/ Behavioral Observations Min A, limited by fatigue and pain  Supervision  problem solving and recall    Pain   Denies pain this shift.  Pain less than or equal to 2  Assess for pain and medicate prn   Skin   Skin is clean and dry. Incision to LUA graft with sutures intact. Dressing changes to LLQ peritoneal dialysis cath.   No skin breakdown while in rehab.  Assess skin q shift and prn.     Rehab Goals Patient on target to meet rehab goals: Yes (If he fully participates) *See Care Plan and progress notes for long and short-term goals.   Barriers to Discharge: decreased insight/awareness. dialysis question   Possible Resolutions to Barriers:  decide plan on HD. continued community reintegration, safety training   Discharge Planning/Teaching Needs:  Plan for pt to return home with his sister, [name], who can provide intermittnet support.  She reports that pt's son is supposed to move into the home to assist as well.  TBD   Team Discussion:  C/o abd pain but better after BM  this afternoon.  Pt changed back to PD per his request.  Only requires supervision when not in pain.  Pt very resistant to ST treatments and insists no cognitive difficulties.  Mod ind goals for PT/OT but may need supervision / oversight due to poor awareness.  Revisions to Treatment Plan:  None    Continued Need for Acute Rehabilitation Level of Care: The patient requires daily medical management by a physician with specialized training in physical medicine and rehabilitation for the following conditions: Daily direction of a multidisciplinary physical rehabilitation program to ensure safe treatment while eliciting the highest outcome that is of practical value to the patient.: Yes Daily medical  management of patient stability for increased activity during participation in an intensive rehabilitation regime.: Yes Daily analysis of laboratory values and/or radiology reports with any subsequent need for medication adjustment of medical intervention for : Neurological problems;Renal problems;Mood/behavior problems   Eneida Evers, [name] 12/05/2016, 4:32 PM

## 2016-12-07 NOTE — Progress Notes (Signed)
Subjective/Complaints: Sore from therapy. Therapy went well. Has appetite. Feels ready to go home tomorrow  ROS: pt denies nausea, vomiting, diarrhea, cough, shortness of breath or chest pain   Objective: Vital Signs: Blood pressure (!) 162/81, pulse 82, temperature 98.8 F (37.1 C), temperature source Oral, resp. rate 20, weight 81.5 kg (179 lb 10.8 oz), SpO2 100 %. No results found. Results for orders placed or performed during the hospital encounter of 12/01/16 (from the past 72 hour(s))  Glucose, capillary     Status: Abnormal   Collection Time: 12/04/16 11:30 AM  Result Value Ref Range   Glucose-Capillary 157 (H) 65 - 99 mg/dL   Comment 1 Notify RN   Glucose, capillary     Status: Abnormal   Collection Time: 12/04/16  5:50 PM  Result Value Ref Range   Glucose-Capillary 136 (H) 65 - 99 mg/dL   Comment 1 Notify RN   Glucose, capillary     Status: Abnormal   Collection Time: 12/04/16  8:49 PM  Result Value Ref Range   Glucose-Capillary 209 (H) 65 - 99 mg/dL   Comment 1 Notify RN   Glucose, capillary     Status: Abnormal   Collection Time: 12/05/16  2:36 AM  Result Value Ref Range   Glucose-Capillary 271 (H) 65 - 99 mg/dL   Comment 1 Notify RN   Glucose, capillary     Status: Abnormal   Collection Time: 12/05/16  5:15 AM  Result Value Ref Range   Glucose-Capillary 331 (H) 65 - 99 mg/dL   Comment 1 Notify RN   Glucose, capillary     Status: Abnormal   Collection Time: 12/05/16 11:58 AM  Result Value Ref Range   Glucose-Capillary 178 (H) 65 - 99 mg/dL  Glucose, capillary     Status: Abnormal   Collection Time: 12/05/16  4:47 PM  Result Value Ref Range   Glucose-Capillary 60 (L) 65 - 99 mg/dL  Glucose, capillary     Status: None   Collection Time: 12/05/16  5:16 PM  Result Value Ref Range   Glucose-Capillary 88 65 - 99 mg/dL  Glucose, capillary     Status: Abnormal   Collection Time: 12/05/16  8:37 PM  Result Value Ref Range   Glucose-Capillary 160 (H) 65 - 99  mg/dL   Comment 1 Notify RN   Glucose, capillary     Status: Abnormal   Collection Time: 12/06/16  3:02 AM  Result Value Ref Range   Glucose-Capillary 210 (H) 65 - 99 mg/dL   Comment 1 Notify RN   Glucose, capillary     Status: Abnormal   Collection Time: 12/06/16  5:26 AM  Result Value Ref Range   Glucose-Capillary 201 (H) 65 - 99 mg/dL   Comment 1 Notify RN   Glucose, capillary     Status: Abnormal   Collection Time: 12/06/16 12:00 PM  Result Value Ref Range   Glucose-Capillary 63 (L) 65 - 99 mg/dL   Comment 1 Notify RN   Glucose, capillary     Status: None   Collection Time: 12/06/16  1:00 PM  Result Value Ref Range   Glucose-Capillary 92 65 - 99 mg/dL  Glucose, capillary     Status: None   Collection Time: 12/06/16  4:44 PM  Result Value Ref Range   Glucose-Capillary 83 65 - 99 mg/dL  Glucose, capillary     Status: None   Collection Time: 12/06/16  8:41 PM  Result Value Ref Range   Glucose-Capillary 90  65 - 99 mg/dL   Comment 1 Notify RN   Glucose, capillary     Status: Abnormal   Collection Time: 12/06/16 10:22 PM  Result Value Ref Range   Glucose-Capillary 41 (LL) 65 - 99 mg/dL   Comment 1 Notify RN   Glucose, capillary     Status: Abnormal   Collection Time: 12/06/16 10:51 PM  Result Value Ref Range   Glucose-Capillary 55 (L) 65 - 99 mg/dL   Comment 1 Notify RN   Glucose, capillary     Status: None   Collection Time: 12/06/16 11:17 PM  Result Value Ref Range   Glucose-Capillary 66 65 - 99 mg/dL   Comment 1 Notify RN   Glucose, capillary     Status: None   Collection Time: 12/06/16 11:42 PM  Result Value Ref Range   Glucose-Capillary 78 65 - 99 mg/dL   Comment 1 Notify RN   Glucose, capillary     Status: Abnormal   Collection Time: 12/07/16  3:07 AM  Result Value Ref Range   Glucose-Capillary 237 (H) 65 - 99 mg/dL  Glucose, capillary     Status: Abnormal   Collection Time: 12/07/16  6:24 AM  Result Value Ref Range   Glucose-Capillary 287 (H) 65 - 99  mg/dL   Comment 1 Notify RN      HEENT: normal Cardio: RRR Resp: CTA B GI: NT to palp, BS+ Extremity:  Pulses positive and No Edema Skin:   Skin clean, intact  Neuro: alert.  Abnormal Motor 4/5 in BUE and BLE. No focal sensory deficits Musc/Skel:  Other no pain with UE or LE ROM Gen NAD Psych: anxious but cooperative   Assessment/Plan: 1. Functional deficits secondary to debility due to multiple medical issues which require 3+ hours per day of interdisciplinary therapy in a comprehensive inpatient rehab setting. Physiatrist is providing close team supervision and 24 hour management of active medical problems listed below. Physiatrist and rehab team continue to assess barriers to discharge/monitor patient progress toward functional and medical goals. FIM: Function - Bathing Position: Shower Body parts bathed by patient: Right arm, Left arm, Chest, Abdomen, Front perineal area, Buttocks, Right upper leg, Left upper leg, Right lower leg, Left lower leg Body parts bathed by helper: Back Assist Level: Supervision or verbal cues Set up : To obtain items, To adjust water temperature  Function- Upper Body Dressing/Undressing What is the patient wearing?: Pull over shirt/dress Pull over shirt/dress - Perfomed by patient: Thread/unthread right sleeve, Thread/unthread left sleeve, Put head through opening, Pull shirt over trunk Assist Level: Set up Set up : To obtain clothing/put away Function - Lower Body Dressing/Undressing What is the patient wearing?: Pants, Non-skid slipper socks, Underwear Position: Wheelchair/chair at sink Underwear - Performed by patient: Thread/unthread right underwear leg, Thread/unthread left underwear leg, Pull underwear up/down Pants- Performed by patient: Thread/unthread left pants leg, Pull pants up/down, Fasten/unfasten pants, Thread/unthread right pants leg Pants- Performed by helper: Thread/unthread right pants leg Non-skid slipper socks- Performed by  patient: Don/doff right sock, Don/doff left sock Non-skid slipper socks- Performed by helper: Don/doff right sock, Don/doff left sock Socks - Performed by patient: Don/doff right sock, Don/doff left sock Shoes - Performed by patient: Don/doff right shoe, Don/doff left shoe Shoes - Performed by helper: Fasten right, Fasten left Assist for footwear: Setup Assist for lower body dressing: Supervision or verbal cues  Function - Toileting Toileting steps completed by patient: Adjust clothing prior to toileting, Performs perineal hygiene, Adjust clothing after toileting Toileting  Assistive Devices: Grab bar or rail Assist level: Supervision or verbal cues  Function - Air cabin crew transfer assistive device: Grab bar Assist level to toilet: Supervision or verbal cues Assist level from toilet: Touching or steadying assistance (Pt > 75%)  Function - Chair/bed transfer Chair/bed transfer method: Ambulatory Chair/bed transfer assist level: Supervision or verbal cues Chair/bed transfer assistive device: Cane Chair/bed transfer details: Verbal cues for precautions/safety, Verbal cues for safe use of DME/AE  Function - Locomotion: Wheelchair Type: Manual Max wheelchair distance: 150 Assist Level: Supervision or verbal cues Assist Level: Supervision or verbal cues Wheel 150 feet activity did not occur: Safety/medical concerns (per report) Assist Level: Supervision or verbal cues Turns around,maneuvers to table,bed, and toilet,negotiates 3% grade,maneuvers on rugs and over doorsills: No Function - Locomotion: Ambulation Assistive device: Cane-straight Max distance: 200 Assist level: Supervision or verbal cues Assist level: Supervision or verbal cues Assist level: Supervision or verbal cues Walk 150 feet activity did not occur: Safety/medical concerns Assist level: Supervision or verbal cues Walk 10 feet on uneven surfaces activity did not occur: Safety/medical concerns Assist level:  Supervision or verbal cues  Function - Comprehension Comprehension: Auditory Comprehension assist level: Understands basic 75 - 89% of the time/ requires cueing 10 - 24% of the time  Function - Expression Expression: Verbal Expression assist level: Expresses basic 75 - 89% of the time/requires cueing 10 - 24% of the time. Needs helper to occlude trach/needs to repeat words.  Function - Social Interaction Social Interaction assist level: Interacts appropriately 75 - 89% of the time - Needs redirection for appropriate language or to initiate interaction.  Function - Problem Solving Problem solving assist level: Solves basic 50 - 74% of the time/requires cueing 25 - 49% of the time  Function - Memory Memory assist level: Recognizes or recalls 50 - 74% of the time/requires cueing 25 - 49% of the time Patient normally able to recall (first 3 days only): Staff names and faces, That he or she is in a hospital, Current season  Medical Problem List and Plan: 1. Functional and mobility deficits secondary to seizure and debility related to multiple medical issues -continue CIR PT, OT, SLP  -home saturday 2. DVT Prophylaxis/Anticoagulation: Pharmaceutical: Heparin 3. Pain Management: N/a 4. Mood: LCSW to follow for evaluation and support.  5. Neuropsych: This patient is not fully capable of making decisions on hisown behalf. 6. Skin/Wound Care: Maintain adequate nutrition and hydration status. Routine pressure relief measures.  7. Fluids/Electrolytes/Nutrition: Monitor I/O. Now on renal diet with 1200 cc FR.  -lab work with HD sessions 8. 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 No driving till follow up EEG /evaluation by neurology on outpatient basis.  9. ESRD: Has has been switched to HD on TTS pending neurologic recovery. Hope to transition to PD eventually in outpatient setting-- . -HD   scheduled after therapy day to  accommodate rehab schedule 10 T2DM: BS still poorly controlled   -increased NPH to 20u bid yesterday but decreased in pm to night. Didn't eat breakfast apparently  -adjust to 15u qam and 15u qpm   CBG (last 3)   Recent Labs  12/06/16 2342 12/07/16 0307 12/07/16 0624  GLUCAP 78 237* 287*    11. HTN: Monitor BP bid--on amlodipine, hydralazine, metoprolol and losartan. Medications continue to be adjusted for tighter control. Losartan increase to 100mg  BID on 1/7 Vitals:   12/06/16 1304 12/07/16 0556  BP: (!) 179/92 (!) 162/81  Pulse: 80 82  Resp: 17 20  Temp: 98.1 F (36.7 C) 98.8 F (37.1 C)   12. Anemia of chronic disease: H/H monitored with HD. On aranesp for supplement.  13. Abdominal pain: still some distention on exam, pain inconsistent. Likely m/s component  -suspect it was related to constipation. Improved today  -behavioral component  -bowel prophylaxis   LOS (Days) 6 A FACE TO FACE EVALUATION WAS PERFORMED  Zanetta Dehaan T 12/07/2016, 8:01 AM

## 2016-12-08 ENCOUNTER — Inpatient Hospital Stay (HOSPITAL_COMMUNITY): Payer: Medicare Other | Admitting: Physical Therapy

## 2016-12-08 ENCOUNTER — Inpatient Hospital Stay (HOSPITAL_COMMUNITY): Payer: Medicare Other | Admitting: Occupational Therapy

## 2016-12-08 ENCOUNTER — Inpatient Hospital Stay (HOSPITAL_COMMUNITY): Payer: Medicare Other | Admitting: Speech Pathology

## 2016-12-08 ENCOUNTER — Inpatient Hospital Stay (HOSPITAL_COMMUNITY): Payer: Medicare Other

## 2016-12-08 DIAGNOSIS — N400 Enlarged prostate without lower urinary tract symptoms: Secondary | ICD-10-CM

## 2016-12-08 DIAGNOSIS — R109 Unspecified abdominal pain: Secondary | ICD-10-CM

## 2016-12-08 LAB — CBC WITH DIFFERENTIAL/PLATELET
BASOS ABS: 0.1 10*3/uL (ref 0.0–0.1)
BASOS PCT: 1 %
EOS ABS: 0.1 10*3/uL (ref 0.0–0.7)
Eosinophils Relative: 3 %
HCT: 30.8 % — ABNORMAL LOW (ref 39.0–52.0)
HEMOGLOBIN: 10.4 g/dL — AB (ref 13.0–17.0)
Lymphocytes Relative: 22 %
Lymphs Abs: 1.3 10*3/uL (ref 0.7–4.0)
MCH: 27.3 pg (ref 26.0–34.0)
MCHC: 33.8 g/dL (ref 30.0–36.0)
MCV: 80.8 fL (ref 78.0–100.0)
Monocytes Absolute: 0.8 10*3/uL (ref 0.1–1.0)
Monocytes Relative: 13 %
NEUTROS ABS: 3.4 10*3/uL (ref 1.7–7.7)
NEUTROS PCT: 61 %
PLATELETS: 314 10*3/uL (ref 150–400)
RBC: 3.81 MIL/uL — AB (ref 4.22–5.81)
RDW: 17.1 % — AB (ref 11.5–15.5)
WBC: 5.6 10*3/uL (ref 4.0–10.5)

## 2016-12-08 LAB — GRAM STAIN: Gram Stain: NONE SEEN

## 2016-12-08 LAB — GLUCOSE, CAPILLARY
GLUCOSE-CAPILLARY: 213 mg/dL — AB (ref 65–99)
GLUCOSE-CAPILLARY: 288 mg/dL — AB (ref 65–99)
GLUCOSE-CAPILLARY: 65 mg/dL (ref 65–99)
Glucose-Capillary: 81 mg/dL (ref 65–99)
Glucose-Capillary: 294 mg/dL — ABNORMAL HIGH (ref 65–99)
Glucose-Capillary: 221 mg/dL — ABNORMAL HIGH (ref 65–99)

## 2016-12-08 LAB — BODY FLUID CELL COUNT WITH DIFFERENTIAL: WBC FLUID: 3 uL (ref 0–1000)

## 2016-12-08 MED ORDER — METOPROLOL TARTRATE 100 MG PO TABS: 100.0000 mg | ORAL_TABLET | Freq: Two times a day (BID) | ORAL | 0 refills | Status: DC

## 2016-12-08 MED ORDER — CALCITRIOL 0.5 MCG PO CAPS: 0.5000 ug | ORAL_CAPSULE | Freq: Every day | ORAL | 0 refills | Status: DC

## 2016-12-08 MED ORDER — HYDRALAZINE HCL 100 MG PO TABS: 100.0000 mg | ORAL_TABLET | Freq: Three times a day (TID) | ORAL | 0 refills | Status: DC

## 2016-12-08 MED ORDER — INSULIN ASPART 100 UNIT/ML ~~LOC~~ SOLN: SUBCUTANEOUS | 0 refills | Status: DC

## 2016-12-08 MED ORDER — SENNOSIDES-DOCUSATE SODIUM 8.6-50 MG PO TABS: 2.0000 | ORAL_TABLET | Freq: Every day | ORAL | 0 refills | Status: DC

## 2016-12-08 MED ORDER — SEVELAMER CARBONATE 800 MG PO TABS: 800.0000 mg | ORAL_TABLET | Freq: Three times a day (TID) | ORAL | 0 refills | Status: DC

## 2016-12-08 MED ORDER — IOPAMIDOL (ISOVUE-300) INJECTION 61%
INTRAVENOUS | Status: AC
  Administered 2016-12-08: 100 mL
  Filled 2016-12-08: qty 100

## 2016-12-08 MED ORDER — IOPAMIDOL (ISOVUE-300) INJECTION 61%
15.0000 mL | INTRAVENOUS | Status: AC
  Administered 2016-12-08 (×2): 15 mL via ORAL

## 2016-12-08 MED ORDER — AMLODIPINE BESYLATE 10 MG PO TABS: 10.0000 mg | ORAL_TABLET | Freq: Every day | ORAL | 0 refills | Status: DC

## 2016-12-08 MED ORDER — INSULIN NPH (HUMAN) (ISOPHANE) 100 UNIT/ML ~~LOC~~ SUSP: 15.0000 [IU] | Freq: Two times a day (BID) | SUBCUTANEOUS | 0 refills | Status: DC

## 2016-12-08 MED ORDER — INSULIN NPH (HUMAN) (ISOPHANE) 100 UNIT/ML ~~LOC~~ SUSP
15.0000 [IU] | Freq: Every day | SUBCUTANEOUS | Status: DC
  Administered 2016-12-08: 15 [IU] via SUBCUTANEOUS

## 2016-12-08 MED ORDER — GENTAMICIN SULFATE 0.1 % EX CREA: 1.0000 "application " | TOPICAL_CREAM | Freq: Every day | CUTANEOUS | 0 refills | Status: DC

## 2016-12-08 NOTE — Discharge Summary (Signed)
Physician Discharge Summary  Patient ID: Jared [name] MRN: [id number] DOB/AGE: [birth year] 61 y.o.  Admit date: 12/01/2016 Discharge date: 12/09/2016  Discharge Diagnoses:  Principal Problem:   Encephalopathy Active Problems:   Seizure ([place])   Hypertension   CKD (chronic kidney disease) stage V requiring chronic dialysis (HCC)   Abdominal pain   Enlarged prostate   ESRD on dialysis ([place])   Anemia of chronic disease   Discharged Condition: stable  Significant Diagnostic Studies: Ct Angio Neck W Or Wo Contrast  Result Date: 11/24/2016 CLINICAL DATA:  Dialysis patient. Diabetes. Unresponsive. Negative acute head CT. EXAM: CT ANGIOGRAPHY HEAD AND NECK TECHNIQUE: Multidetector CT imaging of the head and neck was performed using the standard protocol during bolus administration of intravenous contrast. Multiplanar CT image reconstructions and MIPs were obtained to evaluate the vascular anatomy. Carotid stenosis measurements (when applicable) are obtained utilizing NASCET criteria, using the distal internal carotid diameter as the denominator. CONTRAST:  50 cc Isovue 370 COMPARISON:  Earlier same day CT FINDINGS: CTA NECK FINDINGS Aortic arch: Mild atherosclerotic change. No aneurysm or dissection. Common origin of the innominate artery and left common carotid artery. Right carotid system: Common carotid artery is widely patent to the bifurcation. The carotid bifurcation does not show atherosclerotic change. No narrowing or irregularity. Cervical ICA is normal. Left carotid system: Common carotid artery is widely patent to the bifurcation. The carotid bifurcation does not show atherosclerotic change. No narrowing or irregularity. Cervical ICA is normal. Vertebral arteries: Both vertebral artery origins are widely patent. Minimal atherosclerotic calcification at the left vertebral artery origin. Both vertebral arteries are widely patent through the cervical region to the foramen magnum. Skeleton:  Negative Other neck: No significant soft tissue finding. Patient is intubated. Upper chest: Patchy dependent atelectasis and are infiltrate bilaterally. Possible aspiration or community acquired pneumonia. Review of the MIP images confirms the above findings CTA HEAD FINDINGS Anterior circulation: Both internal carotid arteries are widely patent through the skullbase and siphon regions. Mild peripheral siphon calcification without stenosis. Anterior and middle cerebral vessels are patent without stenosis, aneurysm or vascular malformation. No missing branch vessels are identified. Posterior circulation: Both vertebral arteries are widely patent to the basilar. Mild calcification at the foramen magnum level without stenosis. No vertebral stenosis. Posterior circulation branch vessels are normal. Venous sinuses: Patent and normal Anatomic variants: None significant Delayed phase: No abnormal enhancement Review of the MIP images confirms the above findings IMPRESSION: CT angiography is negative for large vessel occlusion. Carotid bifurcations are normal. Only very minimal atherosclerotic change is demonstrated in the region imaged. These results were called by telephone at the time of interpretation on 11/24/2016 at 6:40 pm to Dr. Kerney Elbe , who verbally acknowledged these results. Initial code stroke head CT results also communicated at this time. Electronically Signed   By: Nelson Chimes M.D.   On: 11/24/2016 18:47   Mr Brain Wo Contrast  Result Date: 11/27/2016 CLINICAL DATA:  Unresponsive, hyperglycemia and seizures. History of hypertension, diabetes and end-stage renal disease on dialysis. EXAM: MRI HEAD WITHOUT CONTRAST TECHNIQUE: Multiplanar, multiecho pulse sequences of the brain and surrounding structures were obtained without intravenous contrast. COMPARISON:  CT HEAD November 24, 2016 FINDINGS: BRAIN: No reduced diffusion to suggest acute ischemia. No susceptibility artifact to suggest hemorrhage. The  ventricles and sulci are normal for patient's age. No suspicious parenchymal signal, masses or mass effect. No abnormal extra-axial fluid collections. No extra-axial masses though, contrast enhanced sequences would be more sensitive. Symmetric normal size, morphology  and signal of the hippocampi. VASCULAR: Normal major intracranial vascular flow voids present at skull base. SKULL AND UPPER CERVICAL SPINE: No abnormal sellar expansion. No suspicious calvarial bone marrow signal. Craniocervical junction maintained. Moderate LEFT temporomandibular osteoarthrosis. Small RIGHT temporomandibular joint effusion. SINUSES/ORBITS: Trace paranasal sinus mucosal thickening. RIGHT concha bullosa. Small mastoid effusions. Prominent superior ophthalmic veins compatible with ventilation. The included ocular globes and orbital contents are non-suspicious. OTHER: LEFT suboccipital small scalp lipoma. Life-support lines in place. IMPRESSION: Negative MRI head for age. Electronically Signed   By: Elon Alas M.D.   On: 11/27/2016 01:19   Ct Abdomen Pelvis W Contrast  Result Date: 12/08/2016 CLINICAL DATA:  Abdominal pain.  Dialysis patient. EXAM: CT ABDOMEN AND PELVIS WITH CONTRAST TECHNIQUE: Multidetector CT imaging of the abdomen and pelvis was performed using the standard protocol following bolus administration of intravenous contrast. CONTRAST:  114mL ISOVUE-300 IOPAMIDOL (ISOVUE-300) INJECTION 61% COMPARISON:  Abdomen radiographs, 12/04/2016 FINDINGS: Lower chest: Clear lung bases.  Heart normal size. Hepatobiliary: No focal liver abnormality is seen. No gallstones, gallbladder wall thickening, or biliary dilatation. Pancreas: Unremarkable. No pancreatic ductal dilatation or surrounding inflammatory changes. Spleen: Normal in size without focal abnormality. Adrenals/Urinary Tract: No adrenal masses. Low-density right renal masses, 1 along the renal sinus measuring 2.2 cm and the other arise from the lower pole measuring  11 mm, both consistent with cysts. There is renal cortical thinning bilaterally. No stones. No hydronephrosis. Ureters are normal course and in caliber. Bladder wall is thickened. Bladder is mostly decompressed. No discrete bladder mass or stone. Stomach/Bowel: Small sliding hiatal hernia. Stomach is otherwise unremarkable. No evidence of bowel obstruction. No bowel wall thickening or convincing adjacent inflammation. Normal appendix visualized. Vascular/Lymphatic: No significant vascular findings are present. Mildly enlarged external iliac chain lymph nodes, largest on the right measuring 14 mm in short axis, and on the left measuring 13 mm in short axis. Right common iliac chain node measures 13 mm in short axis. There are scattered prominent retroperitoneal lymph nodes above this, none of which are enlarged by size criteria. Reproductive: Prostate gland is enlarged measuring 5.9 x 5.1 x 5.7 cm. Other: Moderate ascites. Peritoneal dialysis catheter curls in the left lower quadrant. No abdominal wall hernia. Musculoskeletal: Mild depressions of the upper endplates of B7-J6, L3 and L4, as well as T9-T10. No osteoblastic or osteolytic lesions. IMPRESSION: 1. No definite acute findings within the abdomen or pelvis. Moderate ascites. This may all be the result of peritoneal dialysis. 2. Bladder wall is thickened. There is enlargement of the prostate gland. Findings suggest chronic bladder outlet obstruction. Consider cystitis if there are consistent clinical symptoms. 3. Mildly enlarged pelvic and lower retroperitoneal lymph nodes. These may be reactive. Consider neoplastic disease, specifically prostate carcinoma. 4. Renal cortical thinning consistent with renal failure. Two low-density right renal masses consistent with cysts. No hydronephrosis. Electronically Signed   By: Lajean Manes M.D.   On: 12/08/2016 16:45   IDg Abd 2 Views  Result Date: 12/04/2016 CLINICAL DATA:  Initial evaluation for acute abdominal  pain. EXAM: ABDOMEN - 2 VIEW COMPARISON:  Prior radiograph from 08/10/2015. FINDINGS: Bowel gas pattern within normal limits without evidence for obstruction or ileus. No abnormal bowel wall thickening. No free air identified on lateral decubitus view. Moderate amount of retained stool within the colon. Peritoneal dialysis catheter in place, cord within the lower abdomen. No soft tissue mass or abnormal calcification. Prominent vascular calcifications noted within the pelvis. 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 No acute  osseous abnormality. IMPRESSION: 1. Nonobstructive bowel gas pattern with no radiographic evidence for acute intra-abdominal process. 2. Moderate amount of retained stool within the colon, suggesting constipation. 3. Peritoneal dialysis catheter coiled within the lower abdomen. Electronically Signed   By: Jeannine Boga M.D.   On: 12/04/2016 13:10       Labs:  Basic Metabolic Panel: BMP Latest Ref Rng & Units 11/30/2016 11/29/2016 11/28/2016  Glucose 65 - 99 mg/dL 171(H) 173(H) 381(H)  BUN 6 - 20 mg/dL 48(H) 34(H) 67(H)  Creatinine 0.61 - 1.24 mg/dL 8.58(H) 6.57(H) 10.46(H)  Sodium 135 - 145 mmol/L 136 137 135  Potassium 3.5 - 5.1 mmol/L 4.4 4.4 4.4  Chloride 101 - 111 mmol/L 98(L) 100(L) 96(L)  CO2 22 - 32 mmol/L 26 27 25   Calcium 8.9 - 10.3 mg/dL 8.4(L) 8.1(L) 7.9(L)    CBC: CBC Latest Ref Rng & Units 12/08/2016 11/30/2016 11/28/2016  WBC 4.0 - 10.5 K/uL 5.6 5.6 8.0  Hemoglobin 13.0 - 17.0 g/dL 10.4(L) 9.9(L) 10.4(L)  Hematocrit 39.0 - 52.0 % 30.8(L) 30.7(L) 32.4(L)  Platelets 150 - 400 K/uL 314 197 197    CBG:  Recent Labs Lab 12/08/16 1249 12/08/16 1728 12/08/16 2100 12/09/16 0631 12/09/16 1209  GLUCAP 58 294* 4* 341* 131*    Brief HPI:   Jared [name] a 61 y.o.malewith history of 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 He was  intubated for airway protection and was loaded with keppra. CTA head/neck negative for large vessel occlusion. MRI brain negative for acute changes. Seizures felt to be due to hyperglycemia and likely contributing to RUE weakness. Dr. Leonel Ramsay recommends continuing [place] for total of 7 days. Dose decreased due to sedation and delirium resolving. BP control improving with addition of Losartan and off insulin drip. Transitioned to HD and requried L-AV fistula thrombolysis by radiology on 01/4. Therapy evaluations done revealing generalized weakness with delayed processing and muffled speech. PM&R was consulted and felt that he could benefit from an inpatient rehab admission. Pt is being admitted today    Hospital Course: Jared [name] was admitted to rehab 12/01/2016 for inpatient therapies to consist of PT, ST and OT at least three hours five days a week. Past admission physiatrist, therapy team and rehab RN have worked together to provide customized collaborative inpatient rehab. He reported complaints of abdominal pain and KUB done showed evidence of constipation. Bowel program was augment with good results. PD was resumed due to patient's resistant to HD. Blood pressure have been labile and medications were adjusted for tighter control. Losartan was increased for tighter control but discontinued on 1/10 due to reports of nagging cough.   Blood sugars have been labile and difficult to control in part due to variable po intake. On 01/12, he reported ongoing issues with intermittent severe abdominal pain therefore CT abdomen/pelvis showed enlarged prostate with moderate ascites and was negative for infection. Nephrology has been following for input and PD fluid was sent for gram stain which was negative for  WBC or organisms and culture pending. CBC done and is negative for leucocytosis.  He continues to have poor awareness and requires moderate assist with cognitive tasks and family to assist with  medication management.  He requires intermittent supervision with mobility and min assist with cognitive tasks. He will continue to receive follow up [facility], [facility]OT and [facility] after discharge.     Rehab  course: During patient's stay in rehab weekly team conferences were held to monitor patient's progress, set goals and discuss barriers to discharge. At admission, patient required min assist with mobility and min to mod assist with ADL tasks. He demonstrated cognitive impairments in the areas of recall, problem solving, attention and awareness and required min to max cues for recall.  He has had improvement in activity tolerance, balance, postural control, as well as ability to compensate for deficits. He is able to complete ADL tasks at modified independent level when not affected by abdominal pain. He is modified independent for transfers and to ambulate. He continues to have poor insight into cognitive deficits and requires min assist for medication management and problem solving and moderate assist for recall.     Disposition: Home.   Diet: Diabetic diet.   Special Instructions: 1. Family to assist with medication management. 2. Needs referral  to urology for evaluation of enlarged prostate. 3. Needs to check BS ac/hs basis.    Discharge Instructions    Ambulatory referral to Neurology    Complete by:  As directed    An appointment is requested in approximately:  2 weeks after 12/09/16 (rehab discharge date).  Dx : Seizures   Ambulatory referral to Physical Medicine Rehab    Complete by:  As directed    1-2 weeks follow up for transitional care--ELOS 1/13     Allergies as of 12/09/2016      Reactions   Penicillins Other (See Comments)   Has patient had a PCN reaction causing immediate rash, facial/tongue/throat swelling, SOB or lightheadedness with hypotension: Unk Has patient had a PCN reaction causing severe rash involving mucus membranes or skin necrosis:  Unk Has patient had a PCN reaction that required hospitalization: Unk Has patient had a PCN reaction occurring within the last 10 years: Unk If all of the above answers are "NO", then may proceed with Cephalosporin use.   Cozaar [losartan Potassium]    cough      Medication List    STOP taking these medications   cinacalcet 30 MG tablet Commonly known as:  SENSIPAR   losartan 50 MG tablet Commonly known as:  COZAAR   oxyCODONE 5 MG immediate release tablet Commonly known as:  ROXICODONE     TAKE these medications   amLODipine 10 MG tablet Commonly known as:  NORVASC Take 1 tablet (10 mg total) by mouth daily.   b complex-vitamin c-folic acid 0.8 MG Tabs tablet Take 1 tablet by mouth at bedtime.   calcitRIOL 0.5 MCG capsule Commonly known as:  ROCALTROL Take 1 capsule (0.5 mcg total) by mouth daily.   calcium carbonate 500 MG chewable tablet Commonly known as:  TUMS - dosed in mg elemental calcium Chew 2 tablets (400 mg of elemental calcium total) by mouth 3 (three) times daily with meals.   gentamicin cream 0.1 % Commonly known as:  GARAMYCIN Apply 1 application topically daily. To PD site   hydrALAZINE 100 MG tablet Commonly known as:  APRESOLINE Take 1 tablet (100 mg total) by mouth 3 (three) times daily.   insulin aspart 100 UNIT/ML injection Commonly known as:  novoLOG Use 10 units with breakfast and supper as long as you eat more than 50% of your meal. What changed:  how much to take  how to take this  when to take this  additional instructions   insulin NPH Human 100 UNIT/ML injection Commonly known as:  HUMULIN N,NOVOLIN N Inject 0.15 mLs (15 Units  total) into the skin 2 (two) times daily at 8 am and 10 pm. What changed:  how much to take   metoprolol 100 MG tablet Commonly known as:  LOPRESSOR Take 1 tablet (100 mg total) by mouth 2 (two) times daily.   senna-docusate 8.6-50 MG tablet Commonly known as:  Senokot-S Take 2 tablets by mouth at  bedtime.   sevelamer carbonate 800 MG tablet Commonly known as:  RENVELA Take 1 tablet (800 mg total) by mouth 3 (three) times daily with meals.      Follow-up Information    [name], MD Follow up.   Specialty:  Physical Medicine and Rehabilitation Why:  office to call you with follow up appointment Contact information: [place] [address] [phone]        Lucrezia Starch, MD Follow up.   Specialty:  Nephrology Contact information: [place] [address] [phone]        Harland Dingwall, NP Follow up on 01/01/2017.   Specialty:  Family Medicine Why:  @ 2:30 pm ("New Patient" appointment). Needs referral to urology for work up of enlarged prostate.  Contact information: [place] [address] [phone]        PATEL, Arvin Collard, DO Follow up.   Specialty:  Neurology Why:  call next week if you have not heard from office. Follow up on seizures.  Contact information: [place] [address] [phone]           Signed: Bary Leriche 12/11/2016, 8:15 AM

## 2016-12-08 NOTE — Progress Notes (Signed)
Occupational Therapy Discharge Summary  Patient Details  Name: Jared [name] MRN: [id number] Date of Birth: 1956-08-11  Patient has met 9 of 9 long term goals due to improved activity tolerance, improved balance, postural control, improved attention, improved awareness and improved coordination.  Patient to discharge at overall Modified Independent level.  Patient's care partner is independent to provide the necessary physical and cognitive assistance at discharge.    Patient has been very limited in participation on CIR due to complaints of abdominal pain and fatigue. RN, PA, and MD have been aware of pain throughout. When patient is pain free, he is able to complete basic ADLs mod I. However, when in significant pain, pt can require up to mod A sit <> stand and min A ambulation. He is able to complete basic ADLs mod I, however, higher level IADL tasks may require supervision for cognitive reasons. Pt with poor insight into cognitive deficits and denies any changes in cognition.    Recommendation:  Patient will benefit from ongoing skilled OT services in home health setting to continue to advance functional skills in the area of iADL.  Equipment: Shower chair with back  Reasons for discharge: treatment goals met and discharge from hospital  Patient/family agrees with progress made and goals achieved: Yes  OT Discharge Precautions/Restrictions  Precautions Precautions: Fall Precaution Comments: peritoneal access abdomen Restrictions Weight Bearing Restrictions: No ADL ADL ADL Comments: Please see functional navigator for ADL status Vision/Perception  Vision- History Baseline Vision/History: Wears glasses Wears Glasses: Reading only Patient Visual Report: No change from baseline  Cognition Overall Cognitive Status: Impaired/Different from baseline Arousal/Alertness: Awake/alert Orientation Level: Oriented X4 Attention: Selective Selective Attention: Impaired Selective  Attention Impairment: Verbal basic;Functional complex Alternating Attention: Impaired Alternating Attention Impairment: Verbal basic;Functional complex Memory: Impaired Memory Impairment: Decreased short term memory Decreased Short Term Memory: Verbal basic;Functional basic Awareness: Impaired Awareness Impairment: Emergent impairment Problem Solving: Impaired Problem Solving Impairment: Functional basic;Verbal basic Behaviors: Poor frustration tolerance Safety/Judgment: Impaired Comments: Decreased awareness of physicla and cognitive deficits Sensation Sensation Light Touch: Appears Intact Proprioception: Appears Intact Coordination Gross Motor Movements are Fluid and Coordinated: Yes Fine Motor Movements are Fluid and Coordinated: Yes Motor  Motor Motor: Within Functional Limits Motor - Discharge Observations: WFL when pt is pain free. Slowed movements with impaired balance with significant abdominal pain Trunk/Postural Assessment  Cervical Assessment Cervical Assessment: Within Functional Limits Thoracic Assessment Thoracic Assessment: Within Functional Limits Lumbar Assessment Lumbar Assessment: Exceptions to [facility] (Posterior pelvic tilt) Postural Control Postural Control: Within Functional Limits  Balance Balance Balance Assessed: Yes Dynamic Sitting Balance Dynamic Sitting - Balance Support: During functional activity;Feet unsupported Dynamic Sitting - Level of Assistance: 6: Modified independent (Device/Increase time) Static Standing Balance Static Standing - Balance Support: During functional activity Static Standing - Level of Assistance: 6: Modified independent (Device/Increase time) Dynamic Standing Balance Dynamic Standing - Balance Support: During functional activity Dynamic Standing - Level of Assistance: 6: Modified independent (Device/Increase time) Extremity/Trunk Assessment RUE Assessment RUE Assessment: Within Functional Limits LUE Assessment LUE  Assessment: Within Functional Limits   See Function Navigator for Current Functional Status.  [name], Jhordan Kinter C 12/08/2016, 7:14 AM

## 2016-12-08 NOTE — Progress Notes (Signed)
Speech Language Pathology Discharge Summary  Patient Details  Name: Jared [name] MRN: [id number] Date of Birth: 29-Mar-1956  Today's Date: 12/08/2016 SLP Individual Time: 1000-1045 SLP Individual Time Calculation (min): 45 min    Skilled Therapeutic Interventions:  Skilled treatment session focused on cognition goals. SLP facilitated session by allowing pt to choose among several activities to increase participation in session. Pt chose checkers and rules of game established prior to playing. Pt with poor tolerance of tasks and continued to change rules as session progressed as he was not able to process different moves within game. Pt adament that rules were not pre-established before game. Pt was left in bed with bed alarm on and all needs within reach.     Patient has met  (None) of 3 long term goals.  Patient to discharge at overall Mod level.  Reasons goals not met: Poor frustration tolerance   Clinical Impression/Discharge Summary:  Pt continues to exhibit Mod cognitive impairments that impact pt's safety upon discharge. Education provided to pt on need for supervision at discharge. Pt is defensive to this recommendation and states that he "isn't stupid but a grown man."  Care Partner:  Caregiver Able to Provide Assistance: Yes  Type of Caregiver Assistance: Cognitive  Recommendation:  24 hour supervision/assistance  Rationale for SLP Follow Up:  (Pt refused)   Equipment: Nonve   Reasons for discharge:     Patient/Family Agrees with Progress Made and Goals Achieved:  (Pt only)   Function:  Eating Eating   Modified Consistency Diet: No Eating Assist Level: No help, No cues           Cognition Comprehension Comprehension assist level: Understands basic 75 - 89% of the time/ requires cueing 10 - 24% of the time  Expression   Expression assist level: Expresses basic 75 - 89% of the time/requires cueing 10 - 24% of the time. Needs helper to occlude trach/needs to  repeat words.  Social Interaction Social Interaction assist level: Interacts appropriately 75 - 89% of the time - Needs redirection for appropriate language or to initiate interaction.  Problem Solving Problem solving assist level: Solves basic 50 - 74% of the time/requires cueing 25 - 49% of the time  Memory Memory assist level: Recognizes or recalls 50 - 74% of the time/requires cueing 25 - 49% of the time   Laurabelle Gorczyca 12/08/2016, 12:09 PM

## 2016-12-08 NOTE — Progress Notes (Signed)
Social Work  Discharge Note  The overall goal for the admission was met for:   Discharge location: Yes - home with sister;  Sister and son are to provide needed supervision  Length of Stay: Yes - 8 days (with 12/09/16 discharge)  Discharge activity level: Yes - supervision to mod ind  Home/community participation: Yes  Services provided included: MD, RD, PT, OT, SLP, RN, TR, Pharmacy and [place]: Medicare  Follow-up services arranged: Home Health: RN, PT, OT, ST via [facility], DME: cane and tub seat via [facility] and Patient/Family has no preference for HH/DME agencies  Comments (or additional information):  Patient/Family verbalized understanding of follow-up arrangements: Yes  Individual responsible for coordination of the follow-up plan: pt  Confirmed correct DME delivered: [facility] 12/08/2016    Tabria Steines

## 2016-12-08 NOTE — Progress Notes (Signed)
Physical Therapy Discharge Summary  Patient Details  Name: Jared [name] MRN: [id number] Date of Birth: [birth year]  Today's Date: 12/08/2016 PT Individual Time: 1330-1425 PT Individual Time Calculation (min): 55 min     Patient has met 6 of 6 long term goals due to improved activity tolerance, improved balance, improved postural control, increased strength, decreased pain, ability to compensate for deficits, functional use of  right upper extremity, right lower extremity, left upper extremity and left lower extremity and improved awareness.  Patient to discharge at an ambulatory level Modified Independent.   Patient to d/c at modI level; no family education performed prior to d/c.  Reasons goals not met: All goals met  Recommendation:  Patient will benefit from ongoing skilled PT services in home health setting to continue to advance safe functional mobility, address ongoing impairments in strength, balance, coordination, activity tolerance and minimize fall risk.  Equipment: SPC  Reasons for discharge: treatment goals met and discharge from hospital  Patient/family agrees with progress made and goals achieved: Yes  PT Discharge Precautions/RestrictionsPrecautions Precautions: Fall Precaution Comments: peritoneal access abdomen Restrictions Weight Bearing Restrictions: No Vital Signs Therapy Vitals Temp: 98.9 F (37.2 C) Temp Source: Oral Pulse Rate: 80 Resp: 18 BP: (!) 164/86 Patient Position (if appropriate): Lying Oxygen Therapy SpO2: 98 % O2 Device: Not Delivered Pain  8/10 pain in abdomen Vision/Perception    WFL Cognition Overall Cognitive Status: Impaired/Different from baseline Arousal/Alertness: Awake/alert Orientation Level: Oriented X4 Attention: Selective Selective Attention: Impaired Selective Attention Impairment: Verbal basic;Functional complex Alternating Attention: Impaired Alternating Attention Impairment: Verbal basic;Functional  complex Memory: Impaired Memory Impairment: Decreased short term memory Decreased Short Term Memory: Verbal basic;Functional basic Awareness: Impaired Awareness Impairment: Emergent impairment Problem Solving: Impaired Problem Solving Impairment: Functional basic;Verbal basic Behaviors: Poor frustration tolerance Safety/Judgment: Impaired Comments: Decreased awareness of physicla and cognitive deficits Sensation Sensation Light Touch: Appears Intact Proprioception: Appears Intact Coordination Gross Motor Movements are Fluid and Coordinated: Yes Fine Motor Movements are Fluid and Coordinated: Yes Motor  Motor Motor: Within Functional Limits Motor - Discharge Observations: WFL when pt is pain free. Slowed movements with impaired balance with significant abdominal pain  Mobility Bed Mobility Bed Mobility: Supine to Sit;Sit to Supine Supine to Sit: 6: Modified independent (Device/Increase time) Sit to Supine: 6: Modified independent (Device/Increase time) Transfers Transfers: Yes Sit to Stand: 6: Modified independent (Device/Increase time) Stand to Sit: 6: Modified independent (Device/Increase time) Stand Pivot Transfers: 6: Modified independent (Device/Increase time) Locomotion  Ambulation Ambulation: Yes Ambulation/Gait Assistance: 6: Modified independent (Device/Increase time) Ambulation Distance (Feet): 175 Feet Assistive device: Straight cane Gait Gait: Yes Gait Pattern: Within Functional Limits Gait velocity: slow speed Stairs / Additional Locomotion Stairs: Yes Stairs Assistance: 6: Modified independent (Device/Increase time) Stair Management Technique: One rail Left;With cane Number of Stairs: 12 Height of Stairs: 6 Ramp: 6: Modified independent (Device) Curb: 6: Modified independent (Device/increase time) Wheelchair Mobility Wheelchair Mobility: No  Trunk/Postural Assessment  Cervical Assessment Cervical Assessment: Within Functional Limits Thoracic  Assessment Thoracic Assessment: Within Functional Limits Lumbar Assessment Lumbar Assessment: Exceptions to [facility] (Posterior pelvic tilt) Postural Control Postural Control: Within Functional Limits  Balance Balance Balance Assessed: Yes Standardized Balance Assessment Standardized Balance Assessment: Berg Balance Test Berg Balance Test Sit to Stand: Able to stand without using hands and stabilize independently Standing Unsupported: Able to stand safely 2 minutes Sitting with Back Unsupported but Feet Supported on Floor or Stool: Able to sit safely and securely 2 minutes Stand to Sit: Sits safely with minimal use of  hands Transfers: Able to transfer safely, minor use of hands Standing Unsupported with Eyes Closed: Able to stand 10 seconds with supervision Standing Ubsupported with Feet Together: Able to place feet together independently and stand 1 minute safely From Standing, Reach Forward with Outstretched Arm: Can reach forward >12 cm safely (5") (limited d/t pain) From Standing Position, Pick up Object from Floor: Able to pick up shoe safely and easily From Standing Position, Turn to Look Behind Over each Shoulder: Looks behind from both sides and weight shifts well Turn 360 Degrees: Able to turn 360 degrees safely but slowly Standing Unsupported, Alternately Place Feet on Step/Stool: Able to stand independently and complete 8 steps >20 seconds Standing Unsupported, One Foot in Front: Able to plae foot ahead of the other independently and hold 30 seconds Standing on One Leg: Able to lift leg independently and hold > 10 seconds Total Score: 50 Dynamic Sitting Balance Dynamic Sitting - Balance Support: During functional activity;Feet unsupported Dynamic Sitting - Level of Assistance: 6: Modified independent (Device/Increase time) Dynamic Sitting - Balance Activities: Lateral lean/weight shifting;Forward lean/weight shifting;Reaching for objects Static Standing Balance Static Standing -  Balance Support: During functional activity Static Standing - Level of Assistance: 6: Modified independent (Device/Increase time) Static Stance: Eyes closed Static Stance: Eyes Closed: x30 sec S Dynamic Standing Balance Dynamic Standing - Balance Support: During functional activity Dynamic Standing - Level of Assistance: 6: Modified independent (Device/Increase time) Dynamic Standing - Balance Activities: Lateral lean/weight shifting;Forward lean/weight shifting;Reaching for objects Extremity Assessment  RUE Assessment RUE Assessment: Within Functional Limits LUE Assessment LUE Assessment: Within Functional Limits RLE Assessment RLE Assessment: Exceptions to [facility] RLE Strength RLE Overall Strength: Deficits (grossly 4/5 to 4+/5 throughout) LLE Assessment LLE Assessment: Exceptions to [facility] LLE Strength LLE Overall Strength: Deficits (grossly 4/5 to 4+/5 throughout)  Skilled Therapeutic Intervention: Pt received supine in bed, c/o 8/10 pain in abdomen and agreeable to treatment. Assessed all mobility as described above with SPC and modI overall. Requires several rest breaks due to pain and fatigue. Patient demonstrates increased (moderate) fall risk as noted by score of 50/56 on Berg Balance Scale.  (<36= high risk for falls, close to 100%; 37-45 significant >80%; 46-51 moderate >50%; 52-55 lower >25%). This demonstrates significant change over baseline evaluation score of 20/56. Educated pt on continued risk for falls despite progress, and recommendation pt continue to use [facility] for safety when d/c home; pt agreeable. Instructed pt in [place] A/B/C exercises with minimal cueing for technique, able to follow written directions for exercises. Pt with no further concerns regarding d/c home at modI level at this time. Pt missed 20 min PT due to IV team arriving to place line. Remained in bed with RN present at end of session.    See Function Navigator for Current Functional Status.  Benjiman Core  Tygielski 12/08/2016, 7:54 AM

## 2016-12-08 NOTE — Discharge Instructions (Signed)
Inpatient Rehab Discharge Instructions  Frantz Quattrone Discharge date and time:  12/10/15  Activities/Precautions/ Functional Status: Activity: no lifting, driving, or strenuous exercise for till cleared by Neurologist. No diving, tub baths, high altitudes, swimming or hot tubs.  Diet: diabetic diet  Wound Care: keep wound clean and dry   Functional status:  ___ No restrictions     ___ Walk up steps independently ___ 24/7 supervision/assistance   ___ Walk up steps with assistance _X__ Intermittent supervision/assistance  _X__ Bathe/dress independently ___ Walk with walker     ___ Bathe/dress with assistance ___ Walk Independently    ___ Shower independently _X__ Walk with cane                ___ Shower with assistance _X__ No alcohol     ___ Return to work/school ________    COMMUNITY REFERRALS UPON DISCHARGE:    Home Health:   PT     OT     ST   RN                        Agency:  [facility] Phone: [phone]   Medical Equipment/Items Ordered: cane, tub seat                                                     Agency/Supplier:  [facility]   Special Instructions: 1. Check blood sugars before meals and at bedtime. 2. Eat a small snack at bedtime if blood sugar is less than 150.  3. You cannot skip meals and have to eat at consistent times to prevent drop in blood sugars.  4. Family to assist with medication management as patient unable to do this independently. .    My questions have been answered and I understand these instructions. I will adhere to these goals and the provided educational materials after my discharge from the hospital.  Patient/Caregiver Signature _______________________________ Date __________  Clinician Signature _______________________________________ Date __________  Please bring this form and your medication list with you to all your follow-up doctor's appointments.

## 2016-12-08 NOTE — Progress Notes (Signed)
Occupational Therapy Session Note  Patient Details  Name: Jared [name] MRN: [id number] Date of Birth: Nov 22, 1956  Today's Date: 12/08/2016 OT Individual Time: 0750-0905 OT Individual Time Calculation (min): 75 min     Short Term Goals:Week 1:  OT Short Term Goal 1 (Week 1): Pt will complete toilet transfer and LRAD with Min A OT Short Term Goal 2 (Week 1): Pt will complete sit<stand for LB ADLs with  Min A OT Short Term Goal 3 (Week 1): Pt will complete tub bench transfer with Min A and LRAD OT Short Term Goal 4 (Week 1): Pt will ambulate into bathroom for bathroom transfers with LRAD  Skilled Therapeutic Interventions/Progress Updates:    Pt seen for OT ADL bathing/dressing session. Pt in supine upon arrival, no pain at rest, however, once he became more mobile cont with complaints of abdominal pain. He required extended seated rest break on EOB for pain to subside and cont to tell therapist about concerns of abdominal pain which he describes as very dull but intense throughout entire abdomen and back which come along like spasms. Pt reports being nervous for planned d/c home tomorrow if pain cont to be this intense stating he feels he would have to return to hospital if he was at home and pain was this bad. RN made aware and therapist encouraged pt to make MD aware during morning [name].  Following extended break, pt ambulated into bathroom and completed bathing/dressing task mod I, sitting on tub bench, standing to complete buttock / pericare hygiene. He dressed via sit <> stand, sitting on EOB to don shoes/ socks and pants with increased time.  Grooming completed standing at sink mod I. He had multiple questions for CSW regarding follow up care covered by insurance as well as desire to let MD/PA aware of cont abdominal pain. He ambulated throughout unit to find these individuals, walking with decreased ambulation speed using cane mod I.  He was able to get questions answered by both  disciplines.  He returned to therapy gym and completed 5 min on NuStep level 4 for UE strength/ endurance. Pt returned to room at end of session, left sitting in recliner with all needs in reach.   Therapy Documentation Precautions:  Precautions Precautions: Fall Precaution Comments: peritoneal access abdomen Restrictions Weight Bearing Restrictions: No ADL: ADL ADL Comments: Please see functional navigator for ADL status  See Function Navigator for Current Functional Status.   Therapy/Group: Individual Therapy  [name], [name] 12/08/2016, 7:04 AM

## 2016-12-08 NOTE — Progress Notes (Signed)
Subjective/Complaints: Up with therapy. Has intermittent fatigue. Sugars still labile. Stomach better  ROS: pt denies nausea, vomiting, diarrhea, cough, shortness of breath or chest pain   Objective: Vital Signs: Blood pressure (!) 164/86, pulse 80, temperature 98.9 F (37.2 C), temperature source Oral, resp. rate 18, weight 80.8 kg (178 lb 2.1 oz), SpO2 98 %. No results found. Results for orders placed or performed during the hospital encounter of 12/01/16 (from the past 72 hour(s))  Glucose, capillary     Status: Abnormal   Collection Time: 12/05/16 11:58 AM  Result Value Ref Range   Glucose-Capillary 178 (H) 65 - 99 mg/dL  Glucose, capillary     Status: Abnormal   Collection Time: 12/05/16  4:47 PM  Result Value Ref Range   Glucose-Capillary 60 (L) 65 - 99 mg/dL  Glucose, capillary     Status: None   Collection Time: 12/05/16  5:16 PM  Result Value Ref Range   Glucose-Capillary 88 65 - 99 mg/dL  Glucose, capillary     Status: Abnormal   Collection Time: 12/05/16  8:37 PM  Result Value Ref Range   Glucose-Capillary 160 (H) 65 - 99 mg/dL   Comment 1 Notify RN   Glucose, capillary     Status: Abnormal   Collection Time: 12/06/16  3:02 AM  Result Value Ref Range   Glucose-Capillary 210 (H) 65 - 99 mg/dL   Comment 1 Notify RN   Glucose, capillary     Status: Abnormal   Collection Time: 12/06/16  5:26 AM  Result Value Ref Range   Glucose-Capillary 201 (H) 65 - 99 mg/dL   Comment 1 Notify RN   Glucose, capillary     Status: Abnormal   Collection Time: 12/06/16 12:00 PM  Result Value Ref Range   Glucose-Capillary 63 (L) 65 - 99 mg/dL   Comment 1 Notify RN   Glucose, capillary     Status: None   Collection Time: 12/06/16  1:00 PM  Result Value Ref Range   Glucose-Capillary 92 65 - 99 mg/dL  Glucose, capillary     Status: None   Collection Time: 12/06/16  4:44 PM  Result Value Ref Range   Glucose-Capillary 83 65 - 99 mg/dL  Glucose, capillary     Status: None   Collection Time: 12/06/16  8:41 PM  Result Value Ref Range   Glucose-Capillary 90 65 - 99 mg/dL   Comment 1 Notify RN   Glucose, capillary     Status: Abnormal   Collection Time: 12/06/16 10:22 PM  Result Value Ref Range   Glucose-Capillary 41 (LL) 65 - 99 mg/dL   Comment 1 Notify RN   Glucose, capillary     Status: Abnormal   Collection Time: 12/06/16 10:51 PM  Result Value Ref Range   Glucose-Capillary 55 (L) 65 - 99 mg/dL   Comment 1 Notify RN   Glucose, capillary     Status: None   Collection Time: 12/06/16 11:17 PM  Result Value Ref Range   Glucose-Capillary 66 65 - 99 mg/dL   Comment 1 Notify RN   Glucose, capillary     Status: None   Collection Time: 12/06/16 11:42 PM  Result Value Ref Range   Glucose-Capillary 78 65 - 99 mg/dL   Comment 1 Notify RN   Glucose, capillary     Status: Abnormal   Collection Time: 12/07/16  3:07 AM  Result Value Ref Range   Glucose-Capillary 237 (H) 65 - 99 mg/dL  Glucose, capillary  Status: Abnormal   Collection Time: 12/07/16  6:24 AM  Result Value Ref Range   Glucose-Capillary 287 (H) 65 - 99 mg/dL   Comment 1 Notify RN   Glucose, capillary     Status: Abnormal   Collection Time: 12/07/16 11:36 AM  Result Value Ref Range   Glucose-Capillary 125 (H) 65 - 99 mg/dL  Glucose, capillary     Status: Abnormal   Collection Time: 12/07/16  4:23 PM  Result Value Ref Range   Glucose-Capillary 62 (L) 65 - 99 mg/dL  Glucose, capillary     Status: None   Collection Time: 12/07/16  5:10 PM  Result Value Ref Range   Glucose-Capillary 96 65 - 99 mg/dL  Glucose, capillary     Status: Abnormal   Collection Time: 12/07/16  8:45 PM  Result Value Ref Range   Glucose-Capillary 248 (H) 65 - 99 mg/dL  Glucose, capillary     Status: Abnormal   Collection Time: 12/08/16  1:04 AM  Result Value Ref Range   Glucose-Capillary 213 (H) 65 - 99 mg/dL  Glucose, capillary     Status: Abnormal   Collection Time: 12/08/16  6:50 AM  Result Value Ref Range    Glucose-Capillary 288 (H) 65 - 99 mg/dL     HEENT: normal Cardio: RRR Resp: CTA B GI: BS+ Extremity:  Pulses positive and No Edema Skin:   Skin clean, intact  Neuro: alert.  Abnormal Motor 4/5 in BUE and BLE--stable. No focal sensory deficits Musc/Skel:  Other no pain with UE or LE ROM Gen NAD Psych: anxious but cooperative   Assessment/Plan: 1. Functional deficits secondary to debility due to multiple medical issues which require 3+ hours per day of interdisciplinary therapy in a comprehensive inpatient rehab setting. Physiatrist is providing close team supervision and 24 hour management of active medical problems listed below. Physiatrist and rehab team continue to assess barriers to discharge/monitor patient progress toward functional and medical goals. FIM: Function - Bathing Position: Shower Body parts bathed by patient: Right arm, Left arm, Chest, Abdomen, Front perineal area, Buttocks, Right upper leg, Left upper leg, Right lower leg, Left lower leg Body parts bathed by helper: Back Assist Level: More than reasonable time Set up : To obtain items, To adjust water temperature  Function- Upper Body Dressing/Undressing What is the patient wearing?: Pull over shirt/dress Pull over shirt/dress - Perfomed by patient: Thread/unthread right sleeve, Thread/unthread left sleeve, Put head through opening, Pull shirt over trunk Assist Level: No help, No cues Set up : To obtain clothing/put away Function - Lower Body Dressing/Undressing What is the patient wearing?: Pants, Underwear, Socks, Shoes Position: Standing at sink Underwear - Performed by patient: Thread/unthread right underwear leg, Thread/unthread left underwear leg, Pull underwear up/down Pants- Performed by patient: Thread/unthread left pants leg, Pull pants up/down, Fasten/unfasten pants, Thread/unthread right pants leg Pants- Performed by helper: Thread/unthread right pants leg Non-skid slipper socks- Performed by  patient: Don/doff right sock, Don/doff left sock Non-skid slipper socks- Performed by helper: Don/doff right sock, Don/doff left sock Socks - Performed by patient: Don/doff right sock, Don/doff left sock Shoes - Performed by patient: Don/doff right shoe, Don/doff left shoe, Fasten right, Fasten left Shoes - Performed by helper: Fasten right, Fasten left Assist for footwear: Independent Assist for lower body dressing: More than reasonable time  Function - Toileting Toileting steps completed by patient: Adjust clothing prior to toileting, Performs perineal hygiene, Adjust clothing after toileting Toileting Assistive Devices: Grab bar or rail Assist level: More  than reasonable time  Function - Air cabin crew transfer assistive device: Cane Assist level to toilet: No Help, no cues, assistive device, takes more than a reasonable amount of time Assist level from toilet: No Help, no cues, assistive device, takes more than a reasonable amount of time  Function - Chair/bed transfer Chair/bed transfer method: Ambulatory Chair/bed transfer assist level: Supervision or verbal cues Chair/bed transfer assistive device: Cane Chair/bed transfer details: Verbal cues for precautions/safety  Function - Locomotion: Wheelchair Type: Manual Max wheelchair distance: 150 Assist Level: Supervision or verbal cues Assist Level: Supervision or verbal cues Wheel 150 feet activity did not occur: Safety/medical concerns (per report) Assist Level: Supervision or verbal cues Turns around,maneuvers to table,bed, and toilet,negotiates 3% grade,maneuvers on rugs and over doorsills: No Function - Locomotion: Ambulation Assistive device: Cane-straight Max distance: 150 Assist level: Supervision or verbal cues Assist level: Supervision or verbal cues Assist level: Supervision or verbal cues Walk 150 feet activity did not occur: Safety/medical concerns Assist level: Supervision or verbal cues Walk 10 feet  on uneven surfaces activity did not occur: Safety/medical concerns Assist level: Supervision or verbal cues  Function - Comprehension Comprehension: Auditory Comprehension assist level: Understands basic 75 - 89% of the time/ requires cueing 10 - 24% of the time  Function - Expression Expression: Verbal Expression assist level: Expresses basic 75 - 89% of the time/requires cueing 10 - 24% of the time. Needs helper to occlude trach/needs to repeat words.  Function - Social Interaction Social Interaction assist level: Interacts appropriately 75 - 89% of the time - Needs redirection for appropriate language or to initiate interaction.  Function - Problem Solving Problem solving assist level: Solves basic 75 - 89% of the time/requires cueing 10 - 24% of the time  Function - Memory Memory assist level: Recognizes or recalls 50 - 74% of the time/requires cueing 25 - 49% of the time Patient normally able to recall (first 3 days only): Staff names and faces, That he or she is in a hospital, Current season  Medical Problem List and Plan: 1. Functional and mobility deficits secondary to seizure and debility related to multiple medical issues -continue CIR PT, OT, SLP  -home Saturday still planned  -Patient to see me in the office for transitional care encounter in 1-2 weeks.  2. DVT Prophylaxis/Anticoagulation: Pharmaceutical: Heparin 3. Pain Management: N/a 4. Mood: LCSW to follow for evaluation and support.  5. Neuropsych: This patient is not fully capable of making decisions on hisown behalf. 6. Skin/Wound Care: Maintain adequate nutrition and hydration status. Routine pressure relief measures.  7. Fluids/Electrolytes/Nutrition: Monitor I/O. Now on renal diet with 1200 cc FR.  -lab work with HD sessions 8. 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 No driving till follow up EEG /evaluation by neurology on outpatient basis.  9.  ESRD: Has has been switched to HD on TTS pending neurologic recovery. Hope to transition to PD eventually in outpatient setting-- . -HD   scheduled after therapy day to accommodate rehab schedule 10 T2DM: BS still poorly controlled    -will need further outpt management  -adjusted NPH to 15u qam and 15u qpm as of yesterday  -sugars difficult to manage b/c of PD and inconsistent PO   CBG (last 3)   Recent Labs  12/07/16 2045 12/08/16 0104 12/08/16 0650  GLUCAP 248* 213* 288*    11. HTN: Monitor BP bid--on amlodipine, hydralazine, metoprolol and losartan. Medications continue to be adjusted for tighter control. Losartan  increase to 100mg  BID on 1/7 Vitals:   12/07/16 0853 12/08/16 0550  BP: (!) 158/82 (!) 164/86  Pulse: 87 80  Resp:  18  Temp:  98.9 F (37.2 C)   12. Anemia of chronic disease: H/H monitored with HD. On aranesp for supplement.  13. Abdominal pain: still some distention on exam, pain inconsistent. Likely m/s component  -suspect it was related to constipation. Improved today  -behavioral component  -bowel prophylaxis   LOS (Days) 7 A FACE TO FACE EVALUATION WAS PERFORMED  Jared [name] T 12/08/2016, 9:20 AM

## 2016-12-08 NOTE — Progress Notes (Signed)
Cognitive deficits discussed with ST---patient easily frustrated and needs cues to tasks requiring higher level processing. Discussed CT results with Dr. Glory Rosebush infection noted. Also discussed concerns about ability to handle PD. He will have patient set up and manage dialysis tonight to make sure he is competent to do this prior to discharge tomorrow.

## 2016-12-09 DIAGNOSIS — Z992 Dependence on renal dialysis: Secondary | ICD-10-CM

## 2016-12-09 DIAGNOSIS — I150 Renovascular hypertension: Secondary | ICD-10-CM

## 2016-12-09 DIAGNOSIS — N186 End stage renal disease: Secondary | ICD-10-CM

## 2016-12-09 DIAGNOSIS — D631 Anemia in chronic kidney disease: Secondary | ICD-10-CM

## 2016-12-09 DIAGNOSIS — D638 Anemia in other chronic diseases classified elsewhere: Secondary | ICD-10-CM

## 2016-12-09 DIAGNOSIS — N189 Chronic kidney disease, unspecified: Secondary | ICD-10-CM

## 2016-12-09 LAB — GLUCOSE, CAPILLARY
GLUCOSE-CAPILLARY: 341 mg/dL — AB (ref 65–99)
Glucose-Capillary: 131 mg/dL — ABNORMAL HIGH (ref 65–99)

## 2016-12-09 LAB — PSA: PSA: 353 ng/mL — ABNORMAL HIGH (ref 0.00–4.00)

## 2016-12-09 NOTE — Progress Notes (Signed)
Subjective/Complaints: Pt seen laying in bed this AM.  He states he slept well overnight and that he was able to handle PD without diffulty.  Confirmed with nursing.    ROS: Denies nausea, vomiting, diarrhea, shortness of breath or chest pain   Objective: Vital Signs: Blood pressure (!) 148/82, pulse 86, temperature 99.6 F (37.6 C), temperature source Oral, resp. rate 18, weight 78.4 kg (172 lb 13.5 oz), SpO2 97 %. Ct Abdomen Pelvis W Contrast  Result Date: 12/08/2016 CLINICAL DATA:  Abdominal pain.  Dialysis patient. EXAM: CT ABDOMEN AND PELVIS WITH CONTRAST TECHNIQUE: Multidetector CT imaging of the abdomen and pelvis was performed using the standard protocol following bolus administration of intravenous contrast. CONTRAST:  145mL ISOVUE-300 IOPAMIDOL (ISOVUE-300) INJECTION 61% COMPARISON:  Abdomen radiographs, 12/04/2016 FINDINGS: Lower chest: Clear lung bases.  Heart normal size. Hepatobiliary: No focal liver abnormality is seen. No gallstones, gallbladder wall thickening, or biliary dilatation. Pancreas: Unremarkable. No pancreatic ductal dilatation or surrounding inflammatory changes. Spleen: Normal in size without focal abnormality. Adrenals/Urinary Tract: No adrenal masses. Low-density right renal masses, 1 along the renal sinus measuring 2.2 cm and the other arise from the lower pole measuring 11 mm, both consistent with cysts. There is renal cortical thinning bilaterally. No stones. No hydronephrosis. Ureters are normal course and in caliber. Bladder wall is thickened. Bladder is mostly decompressed. No discrete bladder mass or stone. Stomach/Bowel: Small sliding hiatal hernia. Stomach is otherwise unremarkable. No evidence of bowel obstruction. No bowel wall thickening or convincing adjacent inflammation. Normal appendix visualized. Vascular/Lymphatic: No significant vascular findings are present. Mildly enlarged external iliac chain lymph nodes, largest on the right measuring 14 mm in  short axis, and on the left measuring 13 mm in short axis. Right common iliac chain node measures 13 mm in short axis. There are scattered prominent retroperitoneal lymph nodes above this, none of which are enlarged by size criteria. Reproductive: Prostate gland is enlarged measuring 5.9 x 5.1 x 5.7 cm. Other: Moderate ascites. Peritoneal dialysis catheter curls in the left lower quadrant. No abdominal wall hernia. Musculoskeletal: Mild depressions of the upper endplates of E9-F8, L3 and L4, as well as T9-T10. No osteoblastic or osteolytic lesions. IMPRESSION: 1. No definite acute findings within the abdomen or pelvis. Moderate ascites. This may all be the result of peritoneal dialysis. 2. Bladder wall is thickened. There is enlargement of the prostate gland. Findings suggest chronic bladder outlet obstruction. Consider cystitis if there are consistent clinical symptoms. 3. Mildly enlarged pelvic and lower retroperitoneal lymph nodes. These may be reactive. Consider neoplastic disease, specifically prostate carcinoma. 4. Renal cortical thinning consistent with renal failure. Two low-density right renal masses consistent with cysts. No hydronephrosis. Electronically Signed   By: Lajean Manes M.D.   On: 12/08/2016 16:45   Results for orders placed or performed during the hospital encounter of 12/01/16 (from the past 72 hour(s))  Glucose, capillary     Status: Abnormal   Collection Time: 12/06/16 12:00 PM  Result Value Ref Range   Glucose-Capillary 63 (L) 65 - 99 mg/dL   Comment 1 Notify RN   Glucose, capillary     Status: None   Collection Time: 12/06/16  1:00 PM  Result Value Ref Range   Glucose-Capillary 92 65 - 99 mg/dL  Glucose, capillary     Status: None   Collection Time: 12/06/16  4:44 PM  Result Value Ref Range   Glucose-Capillary 83 65 - 99 mg/dL  Glucose, capillary     Status: None  Collection Time: 12/06/16  8:41 PM  Result Value Ref Range   Glucose-Capillary 90 65 - 99 mg/dL   Comment  1 Notify RN   Glucose, capillary     Status: Abnormal   Collection Time: 12/06/16 10:22 PM  Result Value Ref Range   Glucose-Capillary 41 (LL) 65 - 99 mg/dL   Comment 1 Notify RN   Glucose, capillary     Status: Abnormal   Collection Time: 12/06/16 10:51 PM  Result Value Ref Range   Glucose-Capillary 55 (L) 65 - 99 mg/dL   Comment 1 Notify RN   Glucose, capillary     Status: None   Collection Time: 12/06/16 11:17 PM  Result Value Ref Range   Glucose-Capillary 66 65 - 99 mg/dL   Comment 1 Notify RN   Glucose, capillary     Status: None   Collection Time: 12/06/16 11:42 PM  Result Value Ref Range   Glucose-Capillary 78 65 - 99 mg/dL   Comment 1 Notify RN   Glucose, capillary     Status: Abnormal   Collection Time: 12/07/16  3:07 AM  Result Value Ref Range   Glucose-Capillary 237 (H) 65 - 99 mg/dL  Glucose, capillary     Status: Abnormal   Collection Time: 12/07/16  6:24 AM  Result Value Ref Range   Glucose-Capillary 287 (H) 65 - 99 mg/dL   Comment 1 Notify RN   Glucose, capillary     Status: Abnormal   Collection Time: 12/07/16 11:36 AM  Result Value Ref Range   Glucose-Capillary 125 (H) 65 - 99 mg/dL  Glucose, capillary     Status: Abnormal   Collection Time: 12/07/16  4:23 PM  Result Value Ref Range   Glucose-Capillary 62 (L) 65 - 99 mg/dL  Glucose, capillary     Status: None   Collection Time: 12/07/16  5:10 PM  Result Value Ref Range   Glucose-Capillary 96 65 - 99 mg/dL  Glucose, capillary     Status: Abnormal   Collection Time: 12/07/16  8:45 PM  Result Value Ref Range   Glucose-Capillary 248 (H) 65 - 99 mg/dL  Glucose, capillary     Status: Abnormal   Collection Time: 12/08/16  1:04 AM  Result Value Ref Range   Glucose-Capillary 213 (H) 65 - 99 mg/dL  Glucose, capillary     Status: Abnormal   Collection Time: 12/08/16  6:50 AM  Result Value Ref Range   Glucose-Capillary 288 (H) 65 - 99 mg/dL  CBC with Differential/Platelet     Status: Abnormal   Collection  Time: 12/08/16 10:38 AM  Result Value Ref Range   WBC 5.6 4.0 - 10.5 K/uL   RBC 3.81 (L) 4.22 - 5.81 MIL/uL   Hemoglobin 10.4 (L) 13.0 - 17.0 g/dL   HCT 30.8 (L) 39.0 - 52.0 %   MCV 80.8 78.0 - 100.0 fL   MCH 27.3 26.0 - 34.0 pg   MCHC 33.8 30.0 - 36.0 g/dL   RDW 17.1 (H) 11.5 - 15.5 %   Platelets 314 150 - 400 K/uL   Neutrophils Relative % 61 %   Neutro Abs 3.4 1.7 - 7.7 K/uL   Lymphocytes Relative 22 %   Lymphs Abs 1.3 0.7 - 4.0 K/uL   Monocytes Relative 13 %   Monocytes Absolute 0.8 0.1 - 1.0 K/uL   Eosinophils Relative 3 %   Eosinophils Absolute 0.1 0.0 - 0.7 K/uL   Basophils Relative 1 %   Basophils Absolute 0.1 0.0 - 0.1  K/uL  Glucose, capillary     Status: None   Collection Time: 12/08/16 11:31 AM  Result Value Ref Range   Glucose-Capillary 65 65 - 99 mg/dL  Body fluid cell count with differential     Status: Abnormal   Collection Time: 12/08/16 12:07 PM  Result Value Ref Range   Fluid Type-FCT Peritoneal    Color, Fluid COLORLESS (A) YELLOW   Appearance, Fluid CLEAR CLEAR   WBC, Fluid 3 0 - 1,000 cu mm   Neutrophil Count, Fluid RARE 0 - 25 %    Comment: TOO FEW TO COUNT, SMEAR AVAILABLE FOR REVIEW   Lymphs, Fluid FEW %   Monocyte-Macrophage-Serous Fluid FEW 50 - 90 %  Gram stain     Status: None   Collection Time: 12/08/16 12:07 PM  Result Value Ref Range   Specimen Description FLUID PERITONEAL    Special Requests NONE    Gram Stain NO WBC SEEN NO ORGANISMS SEEN     Report Status 12/08/2016 FINAL   Glucose, capillary     Status: None   Collection Time: 12/08/16 12:49 PM  Result Value Ref Range   Glucose-Capillary 81 65 - 99 mg/dL  Glucose, capillary     Status: Abnormal   Collection Time: 12/08/16  5:28 PM  Result Value Ref Range   Glucose-Capillary 294 (H) 65 - 99 mg/dL  Glucose, capillary     Status: Abnormal   Collection Time: 12/08/16  9:00 PM  Result Value Ref Range   Glucose-Capillary 221 (H) 65 - 99 mg/dL   Comment 1 Notify RN   Glucose,  capillary     Status: Abnormal   Collection Time: 12/09/16  6:31 AM  Result Value Ref Range   Glucose-Capillary 341 (H) 65 - 99 mg/dL   Comment 1 Notify RN      HEENT: Normocephalic, atraumatic Cardio: RRR. No JVD. Resp: CTA B. Unlabored. GI: BS+. Nontender Skin:   Skin clean, intact  Neuro: Alert.   Motor: 4/5 in BUE and BLE (unchanged) Musc/Skel:  No edema. No tenderness. Gen NAD. Vital signs reviewed. Psych: Normal mood and behavior  Assessment/Plan: 1. Functional deficits secondary to debility due to multiple medical issues which require 3+ hours per day of interdisciplinary therapy in a comprehensive inpatient rehab setting. Physiatrist is providing close team supervision and 24 hour management of active medical problems listed below. Physiatrist and rehab team continue to assess barriers to discharge/monitor patient progress toward functional and medical goals. FIM: Function - Bathing Position: Shower Body parts bathed by patient: Right arm, Left arm, Chest, Abdomen, Front perineal area, Buttocks, Right upper leg, Left upper leg, Right lower leg, Left lower leg Body parts bathed by helper: Back Assist Level: More than reasonable time Set up : To obtain items, To adjust water temperature  Function- Upper Body Dressing/Undressing What is the patient wearing?: Pull over shirt/dress Pull over shirt/dress - Perfomed by patient: Thread/unthread right sleeve, Thread/unthread left sleeve, Put head through opening, Pull shirt over trunk Assist Level: No help, No cues Set up : To obtain clothing/put away Function - Lower Body Dressing/Undressing What is the patient wearing?: Pants, Underwear, Socks, Shoes Position: Standing at sink Underwear - Performed by patient: Thread/unthread right underwear leg, Thread/unthread left underwear leg, Pull underwear up/down Pants- Performed by patient: Thread/unthread left pants leg, Pull pants up/down, Fasten/unfasten pants, Thread/unthread  right pants leg Pants- Performed by helper: Thread/unthread right pants leg Non-skid slipper socks- Performed by patient: Don/doff right sock, Don/doff left sock Non-skid slipper  socks- Performed by helper: Don/doff right sock, Don/doff left sock Socks - Performed by patient: Don/doff right sock, Don/doff left sock Shoes - Performed by patient: Don/doff right shoe, Don/doff left shoe, Fasten right, Fasten left Shoes - Performed by helper: Fasten right, Fasten left Assist for footwear: Independent Assist for lower body dressing: More than reasonable time  Function - Toileting Toileting steps completed by patient: Adjust clothing prior to toileting, Performs perineal hygiene, Adjust clothing after toileting Toileting Assistive Devices: Grab bar or rail Assist level: More than reasonable time  Function - Air cabin crew transfer assistive device: [place] level to toilet: No Help, no cues, assistive device, takes more than a reasonable amount of time Assist level from toilet: No Help, no cues, assistive device, takes more than a reasonable amount of time  Function - Chair/bed transfer Chair/bed transfer method: Ambulatory Chair/bed transfer assist level: No Help, no cues, assistive device, takes more than a reasonable amount of time Chair/bed transfer assistive device: Cane Chair/bed transfer details: Verbal cues for precautions/safety  Function - Locomotion: Wheelchair Will patient use wheelchair at discharge?: No Type: Manual Max wheelchair distance: 150 Assist Level: Supervision or verbal cues Assist Level: Supervision or verbal cues Wheel 150 feet activity did not occur: Safety/medical concerns (per report) Assist Level: Supervision or verbal cues Turns around,maneuvers to table,bed, and toilet,negotiates 3% grade,maneuvers on rugs and over doorsills: No Function - Locomotion: Ambulation Assistive device: Cane-straight Max distance: 175 Assist level: No help, No  cues, assistive device, takes more than a reasonable amount of time Assist level: No help, No cues, assistive device, takes more than a reasonable amount of time Assist level: No help, No cues, assistive device, takes more than a reasonable amount of time Walk 150 feet activity did not occur: Safety/medical concerns Assist level: No help, No cues, assistive device, takes more than a reasonable amount of time Walk 10 feet on uneven surfaces activity did not occur: Safety/medical concerns Assist level: No help, No cues, assistive device, takes more than a reasonable amount of time  Function - Comprehension Comprehension: Auditory Comprehension assist level: Understands basic 75 - 89% of the time/ requires cueing 10 - 24% of the time  Function - Expression Expression: Verbal Expression assist level: Expresses basic 75 - 89% of the time/requires cueing 10 - 24% of the time. Needs helper to occlude trach/needs to repeat words.  Function - Social Interaction Social Interaction assist level: Interacts appropriately 75 - 89% of the time - Needs redirection for appropriate language or to initiate interaction.  Function - Problem Solving Problem solving assist level: Solves basic 50 - 74% of the time/requires cueing 25 - 49% of the time  Function - Memory Memory assist level: Recognizes or recalls 50 - 74% of the time/requires cueing 25 - 49% of the time Patient normally able to recall (first 3 days only): Staff names and faces, That he or she is in a hospital, Current season  Medical Problem List and Plan: 1. Functional and mobility deficits secondary to seizure and debility related to multiple medical issues  D/c today Please see other notes from providers.  Patient to follow up with Dr. Naaman Plummer for transitional care encounter in 1-2 weeks. 2. DVT Prophylaxis/Anticoagulation: Pharmaceutical: Heparin 3. Pain Management: N/a 4. Mood: LCSW to follow for evaluation and support.  5.  Neuropsych: This patient is not fully capable of making decisions on hisown behalf. 6. Skin/Wound Care: Maintain adequate nutrition and hydration status. Routine pressure relief measures.  7. Fluids/Electrolytes/Nutrition: Monitor  I/O. Now on renal diet with 1200 cc FR.  -lab work with HD sessions 8. 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 No driving till follow up EEG /evaluation by neurology on outpatient basis.  9. ESRD: Demonstrated ability to perform PD 10 T2DM: BS still poorly controlled    -will need further outpt management  -adjusted NPH to 15u qam and 15u qpm as of yesterday  -sugars difficult to manage b/c of PD and inconsistent PO   CBG (last 3)   Recent Labs  12/08/16 1728 12/08/16 2100 12/09/16 0631  GLUCAP 294* 221* 341*    11. HTN: Monitor BP bid--on amlodipine, hydralazine, metoprolol and losartan. Medications continue to be adjusted for tighter control. Losartan increase to 100mg  BID on 1/7 Vitals:   12/08/16 2051 12/09/16 0455  BP: (!) 158/75 (!) 148/82  Pulse: 87 86  Resp:  18  Temp:  99.6 F (37.6 C)   12. Anemia of chronic disease: H/H monitored with HD. On aranesp for supplement.  13. Abdominal pain: Resolved  suspect it was related to constipation.   behavioral component  bowel prophylaxis  >35 minutes spent with pt, with >30 min in counseling and coordination of care with physicians, PA, patient, nursing regarding ability to perform PD and insulin at discharge.  Pt appears to have demonstrated competency.   LOS (Days) 8 A FACE TO FACE EVALUATION WAS PERFORMED  Nohea Kras Lorie Phenix 12/09/2016, 8:37 AM

## 2016-12-09 NOTE — Progress Notes (Signed)
Patient disconnected from cycler with [facility] [employer]. Per [name], patient stated how to do pd without need for prompting. Per [name], patient able to do pd @ home.

## 2016-12-09 NOTE — Progress Notes (Signed)
Patient d/c with sister and other family. Questions regarding medication and dialysis answered. 6E RN reported patient was competent for managing peritoneal dialysis.

## 2016-12-09 NOTE — Progress Notes (Signed)
  [facility] Progress Note   Subjective: up in chair shaving. Our PD nurse says that the patient instructed her as to how to disconnect from the machine.   Vitals:   12/08/16 1246 12/08/16 2051 12/09/16 0455 12/09/16 0844  BP: (!) 141/78 (!) 158/75 (!) 148/82 (!) 165/77  Pulse: 79 87 86 91  Resp: 18  18   Temp: 98.8 F (37.1 C)  99.6 F (37.6 C)   TempSrc: Oral  Oral   SpO2: 98%  97%   Weight:   78.4 kg (172 lb 13.5 oz)     Inpatient medications: . amLODipine  10 mg Oral Daily  . calcitRIOL  0.5 mcg Oral Daily  . calcium carbonate  400 mg of elemental calcium Oral TID WC  . [START ON 12/13/2016] darbepoetin (ARANESP) injection - DIALYSIS  60 mcg Subcutaneous Q Wed-1800  . dialysis solution 1.5% low-MG/low-CA  100 mL Intraperitoneal Q24H  . dialysis solution 2.5% low-MG/low-CA   Intraperitoneal Q24H  . feeding supplement (NEPRO CARB STEADY)  237 mL Oral BID BM  . gentamicin cream  1 application Topical Daily  . heparin  5,000 Units Subcutaneous Q8H  . hydrALAZINE  100 mg Oral TID  . insulin aspart  0-20 Units Subcutaneous TID WC  . insulin aspart  0-5 Units Subcutaneous QHS  . insulin NPH Human  15 Units Subcutaneous Q breakfast  . insulin NPH Human  15 Units Subcutaneous QHS  . metoprolol tartrate  100 mg Oral BID  . multivitamin  1 tablet Oral QHS  . senna-docusate  2 tablet Oral QHS  . sevelamer carbonate  800 mg Oral TID WC  . sorbitol  30 mL Oral Once    acetaminophen, alum & mag hydroxide-simeth, bisacodyl, diphenhydrAMINE, guaiFENesin-dextromethorphan, heparin, prochlorperazine **OR** prochlorperazine **OR** prochlorperazine, traZODone  Exam: General: alert Ox3 , AAM  Lying in bed  Heart:RRR Lungs: CTA Abdomen:  bs pos , Soft NT, ND Extremities: no pedal edema Dialysis Access: pos bruit LUA AVF   Dialysis: CCPD  79kg   6 exch/ 24 hrs  2.5 L overnight vol and 2 L daytime vol, no pause.  Lab: Hb 12.9, pth 427      Assessment: 1. Seizures - due  to ^^BS and/or HTN'sive enceph. No AED's, no further seizures.  2. HTN - on norvasc/ hydral/ metoprolol.  ARB stopped d/t cough.   3. Abd pain / constipation - better 4. ESRD - PD pt was on temp HD but now back on PD. Patient is competent to resume home PD. 5. L AVF - 1/04 per IR, had thrombosed AVF underwent declot and stenting of cephalic vein (multifocal stenosis didn't respond to repeated venoplasty) 6. Anemia cont max ESA 7. DM on insulin 8. 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     Kelly Splinter MD [employer] pager [phone]   12/09/2016, 11:30 AM

## 2016-12-10 DIAGNOSIS — N2581 Secondary hyperparathyroidism of renal origin: Secondary | ICD-10-CM | POA: Diagnosis not present

## 2016-12-10 DIAGNOSIS — K769 Liver disease, unspecified: Secondary | ICD-10-CM | POA: Diagnosis not present

## 2016-12-10 DIAGNOSIS — N2589 Other disorders resulting from impaired renal tubular function: Secondary | ICD-10-CM | POA: Diagnosis not present

## 2016-12-10 DIAGNOSIS — D631 Anemia in chronic kidney disease: Secondary | ICD-10-CM | POA: Diagnosis not present

## 2016-12-10 DIAGNOSIS — E440 Moderate protein-calorie malnutrition: Secondary | ICD-10-CM | POA: Diagnosis not present

## 2016-12-10 DIAGNOSIS — N186 End stage renal disease: Secondary | ICD-10-CM | POA: Diagnosis not present

## 2016-12-10 DIAGNOSIS — D509 Iron deficiency anemia, unspecified: Secondary | ICD-10-CM | POA: Diagnosis not present

## 2016-12-11 ENCOUNTER — Telehealth: Payer: Self-pay

## 2016-12-11 DIAGNOSIS — N2581 Secondary hyperparathyroidism of renal origin: Secondary | ICD-10-CM | POA: Diagnosis not present

## 2016-12-11 DIAGNOSIS — K769 Liver disease, unspecified: Secondary | ICD-10-CM | POA: Diagnosis not present

## 2016-12-11 DIAGNOSIS — N2589 Other disorders resulting from impaired renal tubular function: Secondary | ICD-10-CM | POA: Diagnosis not present

## 2016-12-11 DIAGNOSIS — N186 End stage renal disease: Secondary | ICD-10-CM | POA: Diagnosis not present

## 2016-12-11 DIAGNOSIS — D509 Iron deficiency anemia, unspecified: Secondary | ICD-10-CM | POA: Diagnosis not present

## 2016-12-11 DIAGNOSIS — E440 Moderate protein-calorie malnutrition: Secondary | ICD-10-CM | POA: Diagnosis not present

## 2016-12-11 LAB — PATHOLOGIST SMEAR REVIEW

## 2016-12-11 NOTE — Telephone Encounter (Signed)
First attempt transitional care call. Left voicemail message to give [place] a call back to discuss upcoming appointment with Dr. Naaman Plummer.

## 2016-12-12 ENCOUNTER — Telehealth: Payer: Self-pay | Admitting: *Deleted

## 2016-12-12 DIAGNOSIS — N186 End stage renal disease: Secondary | ICD-10-CM | POA: Diagnosis not present

## 2016-12-12 DIAGNOSIS — K769 Liver disease, unspecified: Secondary | ICD-10-CM | POA: Diagnosis not present

## 2016-12-12 DIAGNOSIS — N2581 Secondary hyperparathyroidism of renal origin: Secondary | ICD-10-CM | POA: Diagnosis not present

## 2016-12-12 DIAGNOSIS — D509 Iron deficiency anemia, unspecified: Secondary | ICD-10-CM | POA: Diagnosis not present

## 2016-12-12 DIAGNOSIS — E440 Moderate protein-calorie malnutrition: Secondary | ICD-10-CM | POA: Diagnosis not present

## 2016-12-12 DIAGNOSIS — N2589 Other disorders resulting from impaired renal tubular function: Secondary | ICD-10-CM | POA: Diagnosis not present

## 2016-12-12 NOTE — Telephone Encounter (Signed)
Please cancel the TC appointment. I am not surprised. I will forward to Dr. Jonnie Finner so that he's aware.

## 2016-12-12 NOTE — Telephone Encounter (Signed)
Jared Mood RN from [facility] called to report that Jared [name] was refusing services.  He says he does not need anything. He says ..he is driving, cleaning his own room, and totally independent.  I spoke with him and he said he has been dancing with his family.  He denies he had a seizure "or anything of the sort". I will send to Dr Naaman Plummer.  He is cancelling his TC appointment with Naaman Plummer as well.

## 2016-12-12 NOTE — Telephone Encounter (Signed)
I spoke with Jared [name] for TC call but he does not want appointment and will not come. (see call from [facility] RN as well).  He is doing fine and doesn't need anything. He is totally independent.  Appointment will be cancelled. I gave him the number for neurologist Posey Pronto since he has not made appt but he will not call them by his remarks.

## 2016-12-13 ENCOUNTER — Encounter: Payer: Medicare Other | Admitting: Physical Medicine & Rehabilitation

## 2016-12-13 DIAGNOSIS — N2581 Secondary hyperparathyroidism of renal origin: Secondary | ICD-10-CM | POA: Diagnosis not present

## 2016-12-13 DIAGNOSIS — N186 End stage renal disease: Secondary | ICD-10-CM | POA: Diagnosis not present

## 2016-12-13 DIAGNOSIS — E440 Moderate protein-calorie malnutrition: Secondary | ICD-10-CM | POA: Diagnosis not present

## 2016-12-13 DIAGNOSIS — D509 Iron deficiency anemia, unspecified: Secondary | ICD-10-CM | POA: Diagnosis not present

## 2016-12-13 DIAGNOSIS — K769 Liver disease, unspecified: Secondary | ICD-10-CM | POA: Diagnosis not present

## 2016-12-13 DIAGNOSIS — N2589 Other disorders resulting from impaired renal tubular function: Secondary | ICD-10-CM | POA: Diagnosis not present

## 2016-12-13 LAB — CULTURE, BODY FLUID W GRAM STAIN -BOTTLE

## 2016-12-13 LAB — CULTURE, BODY FLUID-BOTTLE: CULTURE: NO GROWTH

## 2016-12-14 DIAGNOSIS — K769 Liver disease, unspecified: Secondary | ICD-10-CM | POA: Diagnosis not present

## 2016-12-14 DIAGNOSIS — D509 Iron deficiency anemia, unspecified: Secondary | ICD-10-CM | POA: Diagnosis not present

## 2016-12-14 DIAGNOSIS — E440 Moderate protein-calorie malnutrition: Secondary | ICD-10-CM | POA: Diagnosis not present

## 2016-12-14 DIAGNOSIS — N2581 Secondary hyperparathyroidism of renal origin: Secondary | ICD-10-CM | POA: Diagnosis not present

## 2016-12-14 DIAGNOSIS — N2589 Other disorders resulting from impaired renal tubular function: Secondary | ICD-10-CM | POA: Diagnosis not present

## 2016-12-14 DIAGNOSIS — N186 End stage renal disease: Secondary | ICD-10-CM | POA: Diagnosis not present

## 2016-12-15 DIAGNOSIS — N2589 Other disorders resulting from impaired renal tubular function: Secondary | ICD-10-CM | POA: Diagnosis not present

## 2016-12-15 DIAGNOSIS — N186 End stage renal disease: Secondary | ICD-10-CM | POA: Diagnosis not present

## 2016-12-15 DIAGNOSIS — E440 Moderate protein-calorie malnutrition: Secondary | ICD-10-CM | POA: Diagnosis not present

## 2016-12-15 DIAGNOSIS — K769 Liver disease, unspecified: Secondary | ICD-10-CM | POA: Diagnosis not present

## 2016-12-15 DIAGNOSIS — D509 Iron deficiency anemia, unspecified: Secondary | ICD-10-CM | POA: Diagnosis not present

## 2016-12-15 DIAGNOSIS — N2581 Secondary hyperparathyroidism of renal origin: Secondary | ICD-10-CM | POA: Diagnosis not present

## 2016-12-16 DIAGNOSIS — D509 Iron deficiency anemia, unspecified: Secondary | ICD-10-CM | POA: Diagnosis not present

## 2016-12-16 DIAGNOSIS — N186 End stage renal disease: Secondary | ICD-10-CM | POA: Diagnosis not present

## 2016-12-16 DIAGNOSIS — N2589 Other disorders resulting from impaired renal tubular function: Secondary | ICD-10-CM | POA: Diagnosis not present

## 2016-12-16 DIAGNOSIS — N2581 Secondary hyperparathyroidism of renal origin: Secondary | ICD-10-CM | POA: Diagnosis not present

## 2016-12-16 DIAGNOSIS — E440 Moderate protein-calorie malnutrition: Secondary | ICD-10-CM | POA: Diagnosis not present

## 2016-12-16 DIAGNOSIS — K769 Liver disease, unspecified: Secondary | ICD-10-CM | POA: Diagnosis not present

## 2016-12-17 DIAGNOSIS — N2589 Other disorders resulting from impaired renal tubular function: Secondary | ICD-10-CM | POA: Diagnosis not present

## 2016-12-17 DIAGNOSIS — N2581 Secondary hyperparathyroidism of renal origin: Secondary | ICD-10-CM | POA: Diagnosis not present

## 2016-12-17 DIAGNOSIS — K769 Liver disease, unspecified: Secondary | ICD-10-CM | POA: Diagnosis not present

## 2016-12-17 DIAGNOSIS — E440 Moderate protein-calorie malnutrition: Secondary | ICD-10-CM | POA: Diagnosis not present

## 2016-12-17 DIAGNOSIS — D509 Iron deficiency anemia, unspecified: Secondary | ICD-10-CM | POA: Diagnosis not present

## 2016-12-17 DIAGNOSIS — N186 End stage renal disease: Secondary | ICD-10-CM | POA: Diagnosis not present

## 2016-12-18 DIAGNOSIS — E440 Moderate protein-calorie malnutrition: Secondary | ICD-10-CM | POA: Diagnosis not present

## 2016-12-18 DIAGNOSIS — R972 Elevated prostate specific antigen [PSA]: Secondary | ICD-10-CM | POA: Diagnosis not present

## 2016-12-18 DIAGNOSIS — D509 Iron deficiency anemia, unspecified: Secondary | ICD-10-CM | POA: Diagnosis not present

## 2016-12-18 DIAGNOSIS — K769 Liver disease, unspecified: Secondary | ICD-10-CM | POA: Diagnosis not present

## 2016-12-18 DIAGNOSIS — N186 End stage renal disease: Secondary | ICD-10-CM | POA: Diagnosis not present

## 2016-12-18 DIAGNOSIS — N281 Cyst of kidney, acquired: Secondary | ICD-10-CM | POA: Diagnosis not present

## 2016-12-18 DIAGNOSIS — N2581 Secondary hyperparathyroidism of renal origin: Secondary | ICD-10-CM | POA: Diagnosis not present

## 2016-12-18 DIAGNOSIS — N2589 Other disorders resulting from impaired renal tubular function: Secondary | ICD-10-CM | POA: Diagnosis not present

## 2016-12-19 DIAGNOSIS — K769 Liver disease, unspecified: Secondary | ICD-10-CM | POA: Diagnosis not present

## 2016-12-19 DIAGNOSIS — N186 End stage renal disease: Secondary | ICD-10-CM | POA: Diagnosis not present

## 2016-12-19 DIAGNOSIS — N2581 Secondary hyperparathyroidism of renal origin: Secondary | ICD-10-CM | POA: Diagnosis not present

## 2016-12-19 DIAGNOSIS — D509 Iron deficiency anemia, unspecified: Secondary | ICD-10-CM | POA: Diagnosis not present

## 2016-12-19 DIAGNOSIS — E784 Other hyperlipidemia: Secondary | ICD-10-CM | POA: Diagnosis not present

## 2016-12-19 DIAGNOSIS — R8299 Other abnormal findings in urine: Secondary | ICD-10-CM | POA: Diagnosis not present

## 2016-12-19 DIAGNOSIS — N2589 Other disorders resulting from impaired renal tubular function: Secondary | ICD-10-CM | POA: Diagnosis not present

## 2016-12-19 DIAGNOSIS — E1129 Type 2 diabetes mellitus with other diabetic kidney complication: Secondary | ICD-10-CM | POA: Diagnosis not present

## 2016-12-19 DIAGNOSIS — E440 Moderate protein-calorie malnutrition: Secondary | ICD-10-CM | POA: Diagnosis not present

## 2016-12-20 DIAGNOSIS — D509 Iron deficiency anemia, unspecified: Secondary | ICD-10-CM | POA: Diagnosis not present

## 2016-12-20 DIAGNOSIS — N2589 Other disorders resulting from impaired renal tubular function: Secondary | ICD-10-CM | POA: Diagnosis not present

## 2016-12-20 DIAGNOSIS — K769 Liver disease, unspecified: Secondary | ICD-10-CM | POA: Diagnosis not present

## 2016-12-20 DIAGNOSIS — N2581 Secondary hyperparathyroidism of renal origin: Secondary | ICD-10-CM | POA: Diagnosis not present

## 2016-12-20 DIAGNOSIS — N186 End stage renal disease: Secondary | ICD-10-CM | POA: Diagnosis not present

## 2016-12-20 DIAGNOSIS — E440 Moderate protein-calorie malnutrition: Secondary | ICD-10-CM | POA: Diagnosis not present

## 2016-12-21 DIAGNOSIS — K769 Liver disease, unspecified: Secondary | ICD-10-CM | POA: Diagnosis not present

## 2016-12-21 DIAGNOSIS — N186 End stage renal disease: Secondary | ICD-10-CM | POA: Diagnosis not present

## 2016-12-21 DIAGNOSIS — N2581 Secondary hyperparathyroidism of renal origin: Secondary | ICD-10-CM | POA: Diagnosis not present

## 2016-12-21 DIAGNOSIS — D509 Iron deficiency anemia, unspecified: Secondary | ICD-10-CM | POA: Diagnosis not present

## 2016-12-21 DIAGNOSIS — N2589 Other disorders resulting from impaired renal tubular function: Secondary | ICD-10-CM | POA: Diagnosis not present

## 2016-12-21 DIAGNOSIS — E440 Moderate protein-calorie malnutrition: Secondary | ICD-10-CM | POA: Diagnosis not present

## 2016-12-22 DIAGNOSIS — D509 Iron deficiency anemia, unspecified: Secondary | ICD-10-CM | POA: Diagnosis not present

## 2016-12-22 DIAGNOSIS — K769 Liver disease, unspecified: Secondary | ICD-10-CM | POA: Diagnosis not present

## 2016-12-22 DIAGNOSIS — N186 End stage renal disease: Secondary | ICD-10-CM | POA: Diagnosis not present

## 2016-12-22 DIAGNOSIS — N2589 Other disorders resulting from impaired renal tubular function: Secondary | ICD-10-CM | POA: Diagnosis not present

## 2016-12-22 DIAGNOSIS — E440 Moderate protein-calorie malnutrition: Secondary | ICD-10-CM | POA: Diagnosis not present

## 2016-12-22 DIAGNOSIS — N2581 Secondary hyperparathyroidism of renal origin: Secondary | ICD-10-CM | POA: Diagnosis not present

## 2016-12-23 DIAGNOSIS — N186 End stage renal disease: Secondary | ICD-10-CM | POA: Diagnosis not present

## 2016-12-23 DIAGNOSIS — K769 Liver disease, unspecified: Secondary | ICD-10-CM | POA: Diagnosis not present

## 2016-12-23 DIAGNOSIS — E440 Moderate protein-calorie malnutrition: Secondary | ICD-10-CM | POA: Diagnosis not present

## 2016-12-23 DIAGNOSIS — N2589 Other disorders resulting from impaired renal tubular function: Secondary | ICD-10-CM | POA: Diagnosis not present

## 2016-12-23 DIAGNOSIS — N2581 Secondary hyperparathyroidism of renal origin: Secondary | ICD-10-CM | POA: Diagnosis not present

## 2016-12-23 DIAGNOSIS — D509 Iron deficiency anemia, unspecified: Secondary | ICD-10-CM | POA: Diagnosis not present

## 2016-12-24 DIAGNOSIS — N186 End stage renal disease: Secondary | ICD-10-CM | POA: Diagnosis not present

## 2016-12-24 DIAGNOSIS — K769 Liver disease, unspecified: Secondary | ICD-10-CM | POA: Diagnosis not present

## 2016-12-24 DIAGNOSIS — D509 Iron deficiency anemia, unspecified: Secondary | ICD-10-CM | POA: Diagnosis not present

## 2016-12-24 DIAGNOSIS — N2581 Secondary hyperparathyroidism of renal origin: Secondary | ICD-10-CM | POA: Diagnosis not present

## 2016-12-24 DIAGNOSIS — E440 Moderate protein-calorie malnutrition: Secondary | ICD-10-CM | POA: Diagnosis not present

## 2016-12-24 DIAGNOSIS — N2589 Other disorders resulting from impaired renal tubular function: Secondary | ICD-10-CM | POA: Diagnosis not present

## 2016-12-25 ENCOUNTER — Encounter: Payer: Self-pay | Admitting: Neurology

## 2016-12-25 ENCOUNTER — Ambulatory Visit (INDEPENDENT_AMBULATORY_CARE_PROVIDER_SITE_OTHER): Payer: Medicare Other | Admitting: Neurology

## 2016-12-25 VITALS — BP 148/87 | HR 77 | Ht 69.0 in | Wt 163.0 lb

## 2016-12-25 DIAGNOSIS — N2581 Secondary hyperparathyroidism of renal origin: Secondary | ICD-10-CM | POA: Diagnosis not present

## 2016-12-25 DIAGNOSIS — E440 Moderate protein-calorie malnutrition: Secondary | ICD-10-CM | POA: Diagnosis not present

## 2016-12-25 DIAGNOSIS — N186 End stage renal disease: Secondary | ICD-10-CM | POA: Diagnosis not present

## 2016-12-25 DIAGNOSIS — R569 Unspecified convulsions: Secondary | ICD-10-CM

## 2016-12-25 DIAGNOSIS — K769 Liver disease, unspecified: Secondary | ICD-10-CM | POA: Diagnosis not present

## 2016-12-25 DIAGNOSIS — D509 Iron deficiency anemia, unspecified: Secondary | ICD-10-CM | POA: Diagnosis not present

## 2016-12-25 DIAGNOSIS — N2589 Other disorders resulting from impaired renal tubular function: Secondary | ICD-10-CM | POA: Diagnosis not present

## 2016-12-25 NOTE — Patient Instructions (Signed)
I had a long discussion the patient and his son regarding his solitary provoked seizure in the setting of significant hyperglycemia. In the presence of normal  Brain MRI,EEG and in presence of a reversible acute medical condition I agree that he does not need long-term anticonvulsants at the present time. I recommend repeat EEG to look related electrical irritability and I advised the patient to avoid seizure provoking stimuli like sleep deprivation, irregular eating and sleeping habits, alcohol, stimulants like cocaine, amphetamine and marijuana. I also counseled him to limit his driving to daytime hours only. He will return for follow-up in 3 months with [place] practitioner call earlier if necessary   Seizure, Adult A seizure is a sudden burst of abnormal electrical activity in the brain. The abnormal activity temporarily interrupts normal brain function, causing a person to experience any of the following:  Involuntary movements.  Changes in awareness or consciousness.  Uncontrollable shaking (convulsions). Seizures usually last from 30 seconds to 2 minutes. They usually do not cause permanent brain damage unless they are prolonged. What can cause a seizure to happen? Seizures can happen for many reasons including:  A fever.  Low blood sugar.  A medicine.  An illnesses.  A brain injury. Some people who have a seizure never have another one. People who have repeated seizures have a condition called epilepsy. What are the symptoms of a seizure? Symptoms of a seizure vary greatly from person to person. They include:  Convulsions.  Stiffening of the body.  Involuntary movements of the arms or legs.  Loss of consciousness.  Breathing problems.  Falling suddenly.  Confusion.  Head nodding.  Eye blinking or fluttering.  Lip smacking.  Drooling.  Rapid eye movements.  Grunting.  Loss of bladder control and bowel control.  Staring.  Unresponsiveness. Some  people have symptoms right before a seizure happens (aura) and right after a seizure happens. Symptoms of an aura include:  Fear or anxiety.  Nausea.  Feeling like the room is spinning (vertigo).  A feeling of having seen or heard something before (deja vu).  Odd tastes or smells.  Changes in vision, such as seeing flashing lights or spots. Symptoms that may follow a seizure include:  Confusion.  Sleepiness.  Headache.  Weakness of one side of the body. Follow these instructions at home: Medicines  Take over-the-counter and prescription medicines only as told by your health care provider.  Avoid any substances that may prevent your medicine from working properly, such as alcohol. Activity  Do not drive, swim, or do any other activities that would be dangerous if you had another seizure. Wait until your health care provider approves.  If you live in the [place] check with your local DMV (department of motor vehicles) to find out about the local driving laws. Each state has specific rules about when you can legally return to driving.  Get enough rest. Lack of sleep can make seizures more likely to occur. Educating others Teach friends and family what to do if you have a seizure. They should:  Lay you on the ground to prevent a fall.  Cushion your head and body.  Loosen any tight clothing around your neck.  Turn you on your side. If vomiting occurs, this helps keep your airway clear.  Stay with you until you recover.  Not hold you down. Holding you down will not stop the seizure.  Not put anything in your mouth.  Know whether or not you need emergency care. General instructions  Contact your health care provider each time you have a seizure.  Avoid anything that has ever triggered a seizure for you.  Keep a seizure diary. Record what you remember about each seizure, especially anything that might have triggered the seizure.  Keep all follow-up visits as told by  your health care provider. This is important. Contact a health care provider if:  You have another seizure.  You have seizures more often.  Your seizure symptoms change.  You continue to have seizures with treatment.  You have symptoms of an infection or illness. They might increase your risk of having a seizure. Get help right away if:  You have a seizure:  That lasts longer than 5 minutes.  That is different than previous seizures.  That leaves you unable to speak or use a part of your body.  That makes it harder to breathe.  After a head injury.  You have:  Multiple seizures in a row.  Confusion or a severe headache right after a seizure.  You are having seizures more often.  You do not wake up immediately after a seizure.  You injure yourself during a seizure. These symptoms may represent a serious problem that is an emergency. Do not wait to see if the symptoms will go away. Get medical help right away. Call your local emergency services (911 in the [place] Do not drive yourself to the hospital.  This information is not intended to replace advice given to you by your health care provider. Make sure you discuss any questions you have with your health care provider. Document Released: 11/10/2000 Document Revised: 07/09/2016 Document Reviewed: 06/16/2016 Elsevier Interactive Patient Education  2017 [employer].

## 2016-12-25 NOTE — Progress Notes (Signed)
[name] [address] [place]. [place] [address] [phone]       OFFICE CONSULT NOTE  Jared. Jared [name] Date of Birth:  1956/05/19 Medical Record Number:  [id number]   Referring MD:  [name]  Reason for Referral:  Seizure HPI: Jared Jared [name] who had a episode of witnessed seizure in the hospital when he was admitted recently.Jared [name] is an 61 y.o. [name] who presents to the ED via EMS after sudden onset of unresponsiveness at home.   His first symptom was severe headache, unknown if lateralized. Symptoms progressed to include right sided tingling, RUE jerking, leaning to the right and aphasia. He then, per family, leaned forward in his chair without complete loss of postural tone and started "jerking all over". At the time of EMS arrival, the jerking had stopped. On arrival to the ED, he began seizing. Seizure was generalized, with eyes deviated to the right and right > left jerking movements. Seizure was aborted with 2 mg IV Ativan.  CBG was 700 on arrival. Severely hypertensive and tachycardic as well.  Brief Neurological exam performed after Ativan administration and prior to intubation (premedicated with Etomidate 20mg  IV andRocuronium 80 mg IV, followed by propofol gtt at 10 mcg/kg/min)  [facility] neurohospitalist Dr [name] revealed decreased tone in all 4 extremities with slightly more movement on the right relative to the left with noxious stimuli. Pupils were pinpoint and unreactive. Neck was supple. Sonorous breathing. Unarousable with sternal rub. Eyes were closed but remained partially open after eyelids manually raised. No doll's eye reflex was present. Reflexes were hypoactive x 4 with mute plantars.  Patient did not abuse alcohol or benzodiazepines per family. EEG was abnormal but showed only mild generalized nonspecific slowing without definite epileptiform activity. MRI scan the brain did not show any acute  infarct or other pathology. Patient was started on Keppra and is seizure was felt to be symptomatic from significantly elevated blood sugar of 700. Patient was initially started on Versed drip which was tapered. Patient was advised to taper his Keppra over a week which she has done. He has had no further recurrent seizures. 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 Patient was asked not to drive till he saw me but still has been doing some limited driving without any events. He has no prior history of seizures ,significant head injury with loss of consciousness. He denies drinking significant amounts of alcohol or doing any street drugs. There is no family history of epilepsy.  ROS:   14 system review of systems is positive for  seizure, weight loss, cough, feeling cold, memory loss, sleepiness, restless legs, not enough sleep and all other systems negative PMH:  Past Medical History:  Diagnosis Date  . Anemia   . Chronic kidney disease    on dialysis M-W-F  . Diabetes mellitus without complication ([place])    onset as adult  . Hypertension   . Seizures ([place])   . Stroke ([place])   . Thyroid disease    hyperparathyroidism    Social History:  Social History   Social History  . Marital status: Single    Spouse name: N/A  . Number of children: N/A  . Years of education: N/A   Occupational History  . Not on file.   Social History Main Topics  . Smoking status: Former Smoker    Quit date: 05/11/1984  . Smokeless tobacco: Never Used  .  Alcohol use 0.6 oz/week    1 Glasses of wine per week     Comment: occasional  . Drug use: No  . Sexual activity: Not on file   Other Topics Concern  . Not on file   Social History Narrative  . No narrative on file    Medications:   Current Outpatient Prescriptions on File Prior to Visit  Medication Sig Dispense Refill  . amLODipine (NORVASC) 10 MG tablet Take 1 tablet (10 mg total) by  mouth daily. 30 tablet 0  . b complex-vitamin c-folic acid (NEPHRO-VITE) 0.8 MG TABS tablet Take 1 tablet by mouth at bedtime.    . calcitRIOL (ROCALTROL) 0.5 MCG capsule Take 1 capsule (0.5 mcg total) by mouth daily. 30 capsule 0  . calcium carbonate (TUMS - DOSED IN MG ELEMENTAL CALCIUM) 500 MG chewable tablet Chew 2 tablets (400 mg of elemental calcium total) by mouth 3 (three) times daily with meals. 30 tablet 0  . gentamicin cream (GARAMYCIN) 0.1 % Apply 1 application topically daily. To PD site 15 g 0  . hydrALAZINE (APRESOLINE) 100 MG tablet Take 1 tablet (100 mg total) by mouth 3 (three) times daily. 90 tablet 0  . insulin aspart (NOVOLOG) 100 UNIT/ML injection Use 10 units with breakfast and supper as long as you eat more than 50% of your meal. 10 mL 0  . insulin NPH Human (HUMULIN N,NOVOLIN N) 100 UNIT/ML injection Inject 0.15 mLs (15 Units total) into the skin 2 (two) times daily at 8 am and 10 pm. 10 mL 0  . metoprolol (LOPRESSOR) 100 MG tablet Take 1 tablet (100 mg total) by mouth 2 (two) times daily. 60 tablet 0  . senna-docusate (SENOKOT-S) 8.6-50 MG tablet Take 2 tablets by mouth at bedtime. 60 tablet 0  . sevelamer carbonate (RENVELA) 800 MG tablet Take 1 tablet (800 mg total) by mouth 3 (three) times daily with meals. 90 tablet 0   No current facility-administered medications on file prior to visit.     Allergies:   Allergies  Allergen Reactions  . Penicillins Other (See Comments)    Has patient had a PCN reaction causing immediate rash, facial/tongue/throat swelling, SOB or lightheadedness with hypotension: Unk Has patient had a PCN reaction causing severe rash involving mucus membranes or skin necrosis: Unk Has patient had a PCN reaction that required hospitalization: Unk Has patient had a PCN reaction occurring within the last 10 years: Unk If all of the above answers are "NO", then may proceed with Cephalosporin use.   Blanchard Kelch [Losartan Potassium]     cough     Physical Exam General: Frail middle-aged African-American [name], seated, in no evident distress Head: head normocephalic and atraumatic.   Neck: supple with no carotid or supraclavicular bruits Cardiovascular: regular rate and rhythm, no murmurs Musculoskeletal: no deformity Skin:  no rash/petichiae Vascular:  Normal pulses all extremities  Neurologic Exam Mental Status: Awake and fully alert. Oriented to place and time. Recent and remote memory intact. Attention span, concentration and fund of knowledge appropriate. Mood and affect appropriate. Able to name only 5 animals with food legs. Diminished recall 2/3. Cranial Nerves: Fundoscopic exam reveals sharp disc margins. Pupils equal, briskly reactive to light. Extraocular movements full without nystagmus. Visual fields full to confrontation. Hearing intact. Facial sensation intact. Face, tongue, palate moves normally and symmetrically.  Motor: Normal bulk and tone. Normal strength in all tested extremity muscles. Sensory.: intact to touch , pinprick , position and vibratory sensation.  Coordination: Rapid  alternating movements normal in all extremities. Finger-to-nose and heel-to-shin performed accurately bilaterally. Gait and Station: Arises from chair without difficulty. Stance is normal. Gait demonstrates normal stride length and balance . Able to heel, toe and tandem walk without difficulty.  Reflexes: 1+ and symmetric. Toes downgoing.      ASSESSMENT: 61 year old patient with the solitary seizure provoked by hyperglycemia with normal brain imaging studies and nonspecific EEG findings.    PLAN: I had a long discussion the patient and his son regarding his solitary provoked seizure in the setting of significant hyperglycemia. In the presence of normal  Brain MRI,EEG and in presence of a reversible acute medical condition I agree that he does not need long-term anticonvulsants at the present time. I recommend repeat EEG to look  related electrical irritability and I advised the patient to avoid seizure provoking stimuli like sleep deprivation, irregular eating and sleeping habits, alcohol, stimulants like cocaine, amphetamine and marijuana. I also counseled him to limit his driving to daytime hours only. Greater than 50% time during this 45 minute consultation visit was spent on counseling and coordination of care about seizures, provoking triggers and answering questions He will return for follow-up in 3 months with [place] practitioner call earlier if necessary Antony Contras, MD  [facility] [address] [place], [place] [address]  Phone [phone] Fax [phone] Note: This document was prepared with digital dictation and possible smart phrase technology. Any transcriptional errors that result from this process are unintentional.

## 2016-12-26 DIAGNOSIS — K769 Liver disease, unspecified: Secondary | ICD-10-CM | POA: Diagnosis not present

## 2016-12-26 DIAGNOSIS — N2581 Secondary hyperparathyroidism of renal origin: Secondary | ICD-10-CM | POA: Diagnosis not present

## 2016-12-26 DIAGNOSIS — N2589 Other disorders resulting from impaired renal tubular function: Secondary | ICD-10-CM | POA: Diagnosis not present

## 2016-12-26 DIAGNOSIS — E440 Moderate protein-calorie malnutrition: Secondary | ICD-10-CM | POA: Diagnosis not present

## 2016-12-26 DIAGNOSIS — N186 End stage renal disease: Secondary | ICD-10-CM | POA: Diagnosis not present

## 2016-12-26 DIAGNOSIS — D509 Iron deficiency anemia, unspecified: Secondary | ICD-10-CM | POA: Diagnosis not present

## 2016-12-27 DIAGNOSIS — D509 Iron deficiency anemia, unspecified: Secondary | ICD-10-CM | POA: Diagnosis not present

## 2016-12-27 DIAGNOSIS — N2581 Secondary hyperparathyroidism of renal origin: Secondary | ICD-10-CM | POA: Diagnosis not present

## 2016-12-27 DIAGNOSIS — N186 End stage renal disease: Secondary | ICD-10-CM | POA: Diagnosis not present

## 2016-12-27 DIAGNOSIS — E440 Moderate protein-calorie malnutrition: Secondary | ICD-10-CM | POA: Diagnosis not present

## 2016-12-27 DIAGNOSIS — N2589 Other disorders resulting from impaired renal tubular function: Secondary | ICD-10-CM | POA: Diagnosis not present

## 2016-12-27 DIAGNOSIS — K769 Liver disease, unspecified: Secondary | ICD-10-CM | POA: Diagnosis not present

## 2016-12-28 DIAGNOSIS — N2581 Secondary hyperparathyroidism of renal origin: Secondary | ICD-10-CM | POA: Diagnosis not present

## 2016-12-28 DIAGNOSIS — Z79899 Other long term (current) drug therapy: Secondary | ICD-10-CM | POA: Diagnosis not present

## 2016-12-28 DIAGNOSIS — D509 Iron deficiency anemia, unspecified: Secondary | ICD-10-CM | POA: Diagnosis not present

## 2016-12-28 DIAGNOSIS — K769 Liver disease, unspecified: Secondary | ICD-10-CM | POA: Diagnosis not present

## 2016-12-28 DIAGNOSIS — N186 End stage renal disease: Secondary | ICD-10-CM | POA: Diagnosis not present

## 2016-12-28 DIAGNOSIS — D631 Anemia in chronic kidney disease: Secondary | ICD-10-CM | POA: Diagnosis not present

## 2016-12-28 DIAGNOSIS — E440 Moderate protein-calorie malnutrition: Secondary | ICD-10-CM | POA: Diagnosis not present

## 2016-12-28 DIAGNOSIS — Z5189 Encounter for other specified aftercare: Secondary | ICD-10-CM | POA: Diagnosis not present

## 2016-12-29 DIAGNOSIS — Z5189 Encounter for other specified aftercare: Secondary | ICD-10-CM | POA: Diagnosis not present

## 2016-12-29 DIAGNOSIS — N186 End stage renal disease: Secondary | ICD-10-CM | POA: Diagnosis not present

## 2016-12-29 DIAGNOSIS — Z79899 Other long term (current) drug therapy: Secondary | ICD-10-CM | POA: Diagnosis not present

## 2016-12-29 DIAGNOSIS — D509 Iron deficiency anemia, unspecified: Secondary | ICD-10-CM | POA: Diagnosis not present

## 2016-12-29 DIAGNOSIS — D631 Anemia in chronic kidney disease: Secondary | ICD-10-CM | POA: Diagnosis not present

## 2016-12-29 DIAGNOSIS — N2581 Secondary hyperparathyroidism of renal origin: Secondary | ICD-10-CM | POA: Diagnosis not present

## 2016-12-30 DIAGNOSIS — N2581 Secondary hyperparathyroidism of renal origin: Secondary | ICD-10-CM | POA: Diagnosis not present

## 2016-12-30 DIAGNOSIS — Z5189 Encounter for other specified aftercare: Secondary | ICD-10-CM | POA: Diagnosis not present

## 2016-12-30 DIAGNOSIS — Z79899 Other long term (current) drug therapy: Secondary | ICD-10-CM | POA: Diagnosis not present

## 2016-12-30 DIAGNOSIS — D509 Iron deficiency anemia, unspecified: Secondary | ICD-10-CM | POA: Diagnosis not present

## 2016-12-30 DIAGNOSIS — N186 End stage renal disease: Secondary | ICD-10-CM | POA: Diagnosis not present

## 2016-12-30 DIAGNOSIS — D631 Anemia in chronic kidney disease: Secondary | ICD-10-CM | POA: Diagnosis not present

## 2016-12-31 DIAGNOSIS — D631 Anemia in chronic kidney disease: Secondary | ICD-10-CM | POA: Diagnosis not present

## 2016-12-31 DIAGNOSIS — Z79899 Other long term (current) drug therapy: Secondary | ICD-10-CM | POA: Diagnosis not present

## 2016-12-31 DIAGNOSIS — Z5189 Encounter for other specified aftercare: Secondary | ICD-10-CM | POA: Diagnosis not present

## 2016-12-31 DIAGNOSIS — N186 End stage renal disease: Secondary | ICD-10-CM | POA: Diagnosis not present

## 2016-12-31 DIAGNOSIS — D509 Iron deficiency anemia, unspecified: Secondary | ICD-10-CM | POA: Diagnosis not present

## 2016-12-31 DIAGNOSIS — N2581 Secondary hyperparathyroidism of renal origin: Secondary | ICD-10-CM | POA: Diagnosis not present

## 2017-01-01 ENCOUNTER — Ambulatory Visit: Payer: Self-pay | Admitting: Family Medicine

## 2017-01-01 DIAGNOSIS — N2581 Secondary hyperparathyroidism of renal origin: Secondary | ICD-10-CM | POA: Diagnosis not present

## 2017-01-01 DIAGNOSIS — N186 End stage renal disease: Secondary | ICD-10-CM | POA: Diagnosis not present

## 2017-01-01 DIAGNOSIS — Z79899 Other long term (current) drug therapy: Secondary | ICD-10-CM | POA: Diagnosis not present

## 2017-01-01 DIAGNOSIS — D509 Iron deficiency anemia, unspecified: Secondary | ICD-10-CM | POA: Diagnosis not present

## 2017-01-01 DIAGNOSIS — R8299 Other abnormal findings in urine: Secondary | ICD-10-CM | POA: Diagnosis not present

## 2017-01-01 DIAGNOSIS — D631 Anemia in chronic kidney disease: Secondary | ICD-10-CM | POA: Diagnosis not present

## 2017-01-01 DIAGNOSIS — Z5189 Encounter for other specified aftercare: Secondary | ICD-10-CM | POA: Diagnosis not present

## 2017-01-02 DIAGNOSIS — Z79899 Other long term (current) drug therapy: Secondary | ICD-10-CM | POA: Diagnosis not present

## 2017-01-02 DIAGNOSIS — D631 Anemia in chronic kidney disease: Secondary | ICD-10-CM | POA: Diagnosis not present

## 2017-01-02 DIAGNOSIS — N2581 Secondary hyperparathyroidism of renal origin: Secondary | ICD-10-CM | POA: Diagnosis not present

## 2017-01-02 DIAGNOSIS — Z5189 Encounter for other specified aftercare: Secondary | ICD-10-CM | POA: Diagnosis not present

## 2017-01-02 DIAGNOSIS — D509 Iron deficiency anemia, unspecified: Secondary | ICD-10-CM | POA: Diagnosis not present

## 2017-01-02 DIAGNOSIS — N186 End stage renal disease: Secondary | ICD-10-CM | POA: Diagnosis not present

## 2017-01-03 DIAGNOSIS — Z79899 Other long term (current) drug therapy: Secondary | ICD-10-CM | POA: Diagnosis not present

## 2017-01-03 DIAGNOSIS — N2581 Secondary hyperparathyroidism of renal origin: Secondary | ICD-10-CM | POA: Diagnosis not present

## 2017-01-03 DIAGNOSIS — D631 Anemia in chronic kidney disease: Secondary | ICD-10-CM | POA: Diagnosis not present

## 2017-01-03 DIAGNOSIS — N186 End stage renal disease: Secondary | ICD-10-CM | POA: Diagnosis not present

## 2017-01-03 DIAGNOSIS — Z5189 Encounter for other specified aftercare: Secondary | ICD-10-CM | POA: Diagnosis not present

## 2017-01-03 DIAGNOSIS — D509 Iron deficiency anemia, unspecified: Secondary | ICD-10-CM | POA: Diagnosis not present

## 2017-01-04 ENCOUNTER — Ambulatory Visit (INDEPENDENT_AMBULATORY_CARE_PROVIDER_SITE_OTHER): Payer: Medicare Other | Admitting: Family Medicine

## 2017-01-04 ENCOUNTER — Ambulatory Visit: Payer: Medicare Other | Admitting: Family Medicine

## 2017-01-04 ENCOUNTER — Encounter: Payer: Self-pay | Admitting: Family Medicine

## 2017-01-04 VITALS — BP 170/84 | HR 72 | Ht 67.8 in | Wt 170.8 lb

## 2017-01-04 DIAGNOSIS — D638 Anemia in other chronic diseases classified elsewhere: Secondary | ICD-10-CM

## 2017-01-04 DIAGNOSIS — E0865 Diabetes mellitus due to underlying condition with hyperglycemia: Secondary | ICD-10-CM | POA: Diagnosis not present

## 2017-01-04 DIAGNOSIS — D631 Anemia in chronic kidney disease: Secondary | ICD-10-CM | POA: Diagnosis not present

## 2017-01-04 DIAGNOSIS — IMO0001 Reserved for inherently not codable concepts without codable children: Secondary | ICD-10-CM | POA: Insufficient documentation

## 2017-01-04 DIAGNOSIS — Z794 Long term (current) use of insulin: Secondary | ICD-10-CM

## 2017-01-04 DIAGNOSIS — Z992 Dependence on renal dialysis: Secondary | ICD-10-CM | POA: Diagnosis not present

## 2017-01-04 DIAGNOSIS — N2581 Secondary hyperparathyroidism of renal origin: Secondary | ICD-10-CM | POA: Diagnosis not present

## 2017-01-04 DIAGNOSIS — Z79899 Other long term (current) drug therapy: Secondary | ICD-10-CM | POA: Diagnosis not present

## 2017-01-04 DIAGNOSIS — N186 End stage renal disease: Secondary | ICD-10-CM

## 2017-01-04 DIAGNOSIS — I10 Essential (primary) hypertension: Secondary | ICD-10-CM

## 2017-01-04 DIAGNOSIS — D509 Iron deficiency anemia, unspecified: Secondary | ICD-10-CM | POA: Diagnosis not present

## 2017-01-04 DIAGNOSIS — Z5189 Encounter for other specified aftercare: Secondary | ICD-10-CM | POA: Diagnosis not present

## 2017-01-04 DIAGNOSIS — E089 Diabetes mellitus due to underlying condition without complications: Secondary | ICD-10-CM | POA: Diagnosis not present

## 2017-01-04 LAB — CBC WITH DIFFERENTIAL/PLATELET
BASOS PCT: 1 %
Basophils Absolute: 51 cells/uL (ref 0–200)
EOS ABS: 255 {cells}/uL (ref 15–500)
Eosinophils Relative: 5 %
HEMATOCRIT: 36.8 % — AB (ref 38.5–50.0)
Hemoglobin: 11.6 g/dL — ABNORMAL LOW (ref 13.2–17.1)
Lymphocytes Relative: 29 %
Lymphs Abs: 1479 cells/uL (ref 850–3900)
MCH: 26.4 pg — ABNORMAL LOW (ref 27.0–33.0)
MCHC: 31.5 g/dL — ABNORMAL LOW (ref 32.0–36.0)
MCV: 83.8 fL (ref 80.0–100.0)
MONO ABS: 561 {cells}/uL (ref 200–950)
MPV: 9.9 fL (ref 7.5–12.5)
Monocytes Relative: 11 %
NEUTROS PCT: 54 %
Neutro Abs: 2754 cells/uL (ref 1500–7800)
Platelets: 269 10*3/uL (ref 140–400)
RBC: 4.39 MIL/uL (ref 4.20–5.80)
RDW: 19.2 % — AB (ref 11.0–15.0)
WBC: 5.1 10*3/uL (ref 4.0–10.5)

## 2017-01-04 LAB — BASIC METABOLIC PANEL
BUN: 57 mg/dL — AB (ref 7–25)
CHLORIDE: 94 mmol/L — AB (ref 98–110)
CO2: 25 mmol/L (ref 20–31)
CREATININE: 10.58 mg/dL — AB (ref 0.70–1.25)
Calcium: 8.6 mg/dL (ref 8.6–10.3)
Glucose, Bld: 194 mg/dL — ABNORMAL HIGH (ref 65–99)
Potassium: 3.7 mmol/L (ref 3.5–5.3)
Sodium: 136 mmol/L (ref 135–146)

## 2017-01-04 LAB — LIPID PANEL
Cholesterol: 215 mg/dL — ABNORMAL HIGH (ref <200)
HDL: 63 mg/dL (ref >40)
LDL CALC: 118 mg/dL — AB (ref <100)
TRIGLYCERIDES: 169 mg/dL — AB (ref <150)
Total CHOL/HDL Ratio: 3.4 Ratio (ref <5.0)
VLDL: 34 mg/dL — ABNORMAL HIGH (ref <30)

## 2017-01-04 LAB — TSH: TSH: 2.22 m[IU]/L (ref 0.40–4.50)

## 2017-01-04 NOTE — Progress Notes (Signed)
   Subjective:    Patient ID: Jared [name], male    DOB: 23-Dec-1955, 61 y.o.   MRN: [id number]  HPI Chief Complaint  Patient presents with  . new pt    new pt. get established. no concerns   He is a 61 year old African- American male who is new to the practice and here to establish care. States he moved here a couple of years ago. Has not had a PCP for at least 2 years.  States he has no concerns today.  Recently was in the hospital for hyperglycemia with BS >600 and mental status changes. He had a seizure apparently.  Denies history of seizures.  States he saw a neurolgist yesterday and he is not on seziure medication. HTN- has not taken his BP medications this afternoon.  States BP at home has been 140s-150s/87.   No particular diet. Does not exercise.  Diabetes type 2 and diagnosed approximately 20 years ago. States he thought he had type 1 diabetes. States while he was in the hospital they told him he had type 2 DM. States he is confused about which type he has.  Checks his blood sugars 4 times per day. States he is averaging in the 200s.   End stage renal disease- states he does peritoneal dialysis daily - Dr. Kerby Moors at [facility] is his neprologist.    Has an appointment Monday with urologist for enlarged prostate. States he was told he most likely has prostate cancer due to high PSA result. CT abdomen/pelvis showed enlarged prostate with moderate ascites.   Reports having a problem sleeping. States he sleeps 1-2 hours per night. This has been an ongoing problem. States he would like medication to help him sleep.   Lives with sister. 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   Reviewed allergies, medications, past medical, and social history.   Review of Systems  Pertinent positives and negatives in the history of present illness.     Objective:   Physical Exam BP (!) 170/84   Pulse 72   Ht 5' 7.8" (1.722 m)   Wt 170 lb 12.8 oz (77.5 kg)    BMI 26.12 kg/m  Alert and in no distress. Pharyngeal area is normal. Neck is supple without adenopathy or thyromegaly. Cardiac exam shows a regular sinus rhythm without murmurs or gallops. Lungs are clear to auscultation. Extremities without edema.   Hgb A1c 9.2%    Assessment & Plan:  Hypertension, unspecified type - Plan: CBC with Differential/Platelet, Basic metabolic panel, Lipid panel  Diabetes mellitus due to underlying condition, uncontrolled, without complication, with long-term current use of insulin (HCC) - Plan: Basic metabolic panel, Lipid panel, TSH, Ambulatory referral to Endocrinology  ESRD on dialysis [facility])  Anemia of chronic disease - Plan: CBC with Differential/Platelet  He is aware that his blood pressure is elevated. States he did not take his blood pressure medication this afternoon. States his BP at home has been much better. He will continue checking his BP daily and report back readings consistently >130/80.  Discussed low sodium diet and DASH diet.  His diabetes is not well controlled with a Hgb A1c 9.2%. He will continue checking his daily blood sugars and stay on current medication regimen. Discussed importance of diet and controlling blood pressure and blood sugar.  Plan to refer him to endocrinology for further assistance with diabetes management.  Plan to check labs and will follow up.

## 2017-01-04 NOTE — Patient Instructions (Addendum)
Call and schedule diabetic eye exam. [facility] [phone] or Margot Ables [phone]  Continue checking blood sugars.  You will get a call from the endocrinologist to help with diabetes.   Follow up with me in 2-3 weeks. Bring in your blood pressure and blood sugar readings.

## 2017-01-05 ENCOUNTER — Encounter: Payer: Self-pay | Admitting: Family Medicine

## 2017-01-05 DIAGNOSIS — D509 Iron deficiency anemia, unspecified: Secondary | ICD-10-CM | POA: Diagnosis not present

## 2017-01-05 DIAGNOSIS — Z79899 Other long term (current) drug therapy: Secondary | ICD-10-CM | POA: Diagnosis not present

## 2017-01-05 DIAGNOSIS — N186 End stage renal disease: Secondary | ICD-10-CM | POA: Diagnosis not present

## 2017-01-05 DIAGNOSIS — N2581 Secondary hyperparathyroidism of renal origin: Secondary | ICD-10-CM | POA: Diagnosis not present

## 2017-01-05 DIAGNOSIS — Z5189 Encounter for other specified aftercare: Secondary | ICD-10-CM | POA: Diagnosis not present

## 2017-01-05 DIAGNOSIS — D631 Anemia in chronic kidney disease: Secondary | ICD-10-CM | POA: Diagnosis not present

## 2017-01-06 DIAGNOSIS — N2581 Secondary hyperparathyroidism of renal origin: Secondary | ICD-10-CM | POA: Diagnosis not present

## 2017-01-06 DIAGNOSIS — Z79899 Other long term (current) drug therapy: Secondary | ICD-10-CM | POA: Diagnosis not present

## 2017-01-06 DIAGNOSIS — N186 End stage renal disease: Secondary | ICD-10-CM | POA: Diagnosis not present

## 2017-01-06 DIAGNOSIS — D509 Iron deficiency anemia, unspecified: Secondary | ICD-10-CM | POA: Diagnosis not present

## 2017-01-06 DIAGNOSIS — Z5189 Encounter for other specified aftercare: Secondary | ICD-10-CM | POA: Diagnosis not present

## 2017-01-06 DIAGNOSIS — D631 Anemia in chronic kidney disease: Secondary | ICD-10-CM | POA: Diagnosis not present

## 2017-01-07 DIAGNOSIS — Z5189 Encounter for other specified aftercare: Secondary | ICD-10-CM | POA: Diagnosis not present

## 2017-01-07 DIAGNOSIS — D631 Anemia in chronic kidney disease: Secondary | ICD-10-CM | POA: Diagnosis not present

## 2017-01-07 DIAGNOSIS — D509 Iron deficiency anemia, unspecified: Secondary | ICD-10-CM | POA: Diagnosis not present

## 2017-01-07 DIAGNOSIS — Z79899 Other long term (current) drug therapy: Secondary | ICD-10-CM | POA: Diagnosis not present

## 2017-01-07 DIAGNOSIS — N186 End stage renal disease: Secondary | ICD-10-CM | POA: Diagnosis not present

## 2017-01-07 DIAGNOSIS — N2581 Secondary hyperparathyroidism of renal origin: Secondary | ICD-10-CM | POA: Diagnosis not present

## 2017-01-08 DIAGNOSIS — Z79899 Other long term (current) drug therapy: Secondary | ICD-10-CM | POA: Diagnosis not present

## 2017-01-08 DIAGNOSIS — Z5189 Encounter for other specified aftercare: Secondary | ICD-10-CM | POA: Diagnosis not present

## 2017-01-08 DIAGNOSIS — N2581 Secondary hyperparathyroidism of renal origin: Secondary | ICD-10-CM | POA: Diagnosis not present

## 2017-01-08 DIAGNOSIS — D631 Anemia in chronic kidney disease: Secondary | ICD-10-CM | POA: Diagnosis not present

## 2017-01-08 DIAGNOSIS — D509 Iron deficiency anemia, unspecified: Secondary | ICD-10-CM | POA: Diagnosis not present

## 2017-01-08 DIAGNOSIS — N186 End stage renal disease: Secondary | ICD-10-CM | POA: Diagnosis not present

## 2017-01-09 DIAGNOSIS — N186 End stage renal disease: Secondary | ICD-10-CM | POA: Diagnosis not present

## 2017-01-09 DIAGNOSIS — D631 Anemia in chronic kidney disease: Secondary | ICD-10-CM | POA: Diagnosis not present

## 2017-01-09 DIAGNOSIS — Z5189 Encounter for other specified aftercare: Secondary | ICD-10-CM | POA: Diagnosis not present

## 2017-01-09 DIAGNOSIS — C61 Malignant neoplasm of prostate: Secondary | ICD-10-CM | POA: Diagnosis not present

## 2017-01-09 DIAGNOSIS — N2581 Secondary hyperparathyroidism of renal origin: Secondary | ICD-10-CM | POA: Diagnosis not present

## 2017-01-09 DIAGNOSIS — D509 Iron deficiency anemia, unspecified: Secondary | ICD-10-CM | POA: Diagnosis not present

## 2017-01-09 DIAGNOSIS — R972 Elevated prostate specific antigen [PSA]: Secondary | ICD-10-CM | POA: Diagnosis not present

## 2017-01-09 DIAGNOSIS — Z79899 Other long term (current) drug therapy: Secondary | ICD-10-CM | POA: Diagnosis not present

## 2017-01-10 ENCOUNTER — Other Ambulatory Visit: Payer: Medicare Other

## 2017-01-10 DIAGNOSIS — D631 Anemia in chronic kidney disease: Secondary | ICD-10-CM | POA: Diagnosis not present

## 2017-01-10 DIAGNOSIS — Z5189 Encounter for other specified aftercare: Secondary | ICD-10-CM | POA: Diagnosis not present

## 2017-01-10 DIAGNOSIS — Z79899 Other long term (current) drug therapy: Secondary | ICD-10-CM | POA: Diagnosis not present

## 2017-01-10 DIAGNOSIS — N2581 Secondary hyperparathyroidism of renal origin: Secondary | ICD-10-CM | POA: Diagnosis not present

## 2017-01-10 DIAGNOSIS — D509 Iron deficiency anemia, unspecified: Secondary | ICD-10-CM | POA: Diagnosis not present

## 2017-01-10 DIAGNOSIS — N186 End stage renal disease: Secondary | ICD-10-CM | POA: Diagnosis not present

## 2017-01-11 ENCOUNTER — Encounter: Payer: Self-pay | Admitting: Internal Medicine

## 2017-01-11 ENCOUNTER — Encounter: Payer: Self-pay | Admitting: Neurology

## 2017-01-11 DIAGNOSIS — D631 Anemia in chronic kidney disease: Secondary | ICD-10-CM | POA: Diagnosis not present

## 2017-01-11 DIAGNOSIS — N2581 Secondary hyperparathyroidism of renal origin: Secondary | ICD-10-CM | POA: Diagnosis not present

## 2017-01-11 DIAGNOSIS — Z5189 Encounter for other specified aftercare: Secondary | ICD-10-CM | POA: Diagnosis not present

## 2017-01-11 DIAGNOSIS — N186 End stage renal disease: Secondary | ICD-10-CM | POA: Diagnosis not present

## 2017-01-11 DIAGNOSIS — D509 Iron deficiency anemia, unspecified: Secondary | ICD-10-CM | POA: Diagnosis not present

## 2017-01-11 DIAGNOSIS — Z79899 Other long term (current) drug therapy: Secondary | ICD-10-CM | POA: Diagnosis not present

## 2017-01-12 DIAGNOSIS — Z5189 Encounter for other specified aftercare: Secondary | ICD-10-CM | POA: Diagnosis not present

## 2017-01-12 DIAGNOSIS — D631 Anemia in chronic kidney disease: Secondary | ICD-10-CM | POA: Diagnosis not present

## 2017-01-12 DIAGNOSIS — N186 End stage renal disease: Secondary | ICD-10-CM | POA: Diagnosis not present

## 2017-01-12 DIAGNOSIS — Z79899 Other long term (current) drug therapy: Secondary | ICD-10-CM | POA: Diagnosis not present

## 2017-01-12 DIAGNOSIS — D509 Iron deficiency anemia, unspecified: Secondary | ICD-10-CM | POA: Diagnosis not present

## 2017-01-12 DIAGNOSIS — N2581 Secondary hyperparathyroidism of renal origin: Secondary | ICD-10-CM | POA: Diagnosis not present

## 2017-01-13 DIAGNOSIS — D509 Iron deficiency anemia, unspecified: Secondary | ICD-10-CM | POA: Diagnosis not present

## 2017-01-13 DIAGNOSIS — Z79899 Other long term (current) drug therapy: Secondary | ICD-10-CM | POA: Diagnosis not present

## 2017-01-13 DIAGNOSIS — D631 Anemia in chronic kidney disease: Secondary | ICD-10-CM | POA: Diagnosis not present

## 2017-01-13 DIAGNOSIS — N2581 Secondary hyperparathyroidism of renal origin: Secondary | ICD-10-CM | POA: Diagnosis not present

## 2017-01-13 DIAGNOSIS — N186 End stage renal disease: Secondary | ICD-10-CM | POA: Diagnosis not present

## 2017-01-13 DIAGNOSIS — Z5189 Encounter for other specified aftercare: Secondary | ICD-10-CM | POA: Diagnosis not present

## 2017-01-14 DIAGNOSIS — Z79899 Other long term (current) drug therapy: Secondary | ICD-10-CM | POA: Diagnosis not present

## 2017-01-14 DIAGNOSIS — D509 Iron deficiency anemia, unspecified: Secondary | ICD-10-CM | POA: Diagnosis not present

## 2017-01-14 DIAGNOSIS — N186 End stage renal disease: Secondary | ICD-10-CM | POA: Diagnosis not present

## 2017-01-14 DIAGNOSIS — N2581 Secondary hyperparathyroidism of renal origin: Secondary | ICD-10-CM | POA: Diagnosis not present

## 2017-01-14 DIAGNOSIS — D631 Anemia in chronic kidney disease: Secondary | ICD-10-CM | POA: Diagnosis not present

## 2017-01-14 DIAGNOSIS — Z5189 Encounter for other specified aftercare: Secondary | ICD-10-CM | POA: Diagnosis not present

## 2017-01-15 ENCOUNTER — Other Ambulatory Visit: Payer: Self-pay | Admitting: Urology

## 2017-01-15 ENCOUNTER — Encounter: Payer: Self-pay | Admitting: Internal Medicine

## 2017-01-15 DIAGNOSIS — Z79899 Other long term (current) drug therapy: Secondary | ICD-10-CM | POA: Diagnosis not present

## 2017-01-15 DIAGNOSIS — Z5189 Encounter for other specified aftercare: Secondary | ICD-10-CM | POA: Diagnosis not present

## 2017-01-15 DIAGNOSIS — C61 Malignant neoplasm of prostate: Secondary | ICD-10-CM

## 2017-01-15 DIAGNOSIS — D509 Iron deficiency anemia, unspecified: Secondary | ICD-10-CM | POA: Diagnosis not present

## 2017-01-15 DIAGNOSIS — N2581 Secondary hyperparathyroidism of renal origin: Secondary | ICD-10-CM | POA: Diagnosis not present

## 2017-01-15 DIAGNOSIS — D631 Anemia in chronic kidney disease: Secondary | ICD-10-CM | POA: Diagnosis not present

## 2017-01-15 DIAGNOSIS — N186 End stage renal disease: Secondary | ICD-10-CM | POA: Diagnosis not present

## 2017-01-16 DIAGNOSIS — Z79899 Other long term (current) drug therapy: Secondary | ICD-10-CM | POA: Diagnosis not present

## 2017-01-16 DIAGNOSIS — N186 End stage renal disease: Secondary | ICD-10-CM | POA: Diagnosis not present

## 2017-01-16 DIAGNOSIS — D631 Anemia in chronic kidney disease: Secondary | ICD-10-CM | POA: Diagnosis not present

## 2017-01-16 DIAGNOSIS — D509 Iron deficiency anemia, unspecified: Secondary | ICD-10-CM | POA: Diagnosis not present

## 2017-01-16 DIAGNOSIS — N2581 Secondary hyperparathyroidism of renal origin: Secondary | ICD-10-CM | POA: Diagnosis not present

## 2017-01-16 DIAGNOSIS — Z5189 Encounter for other specified aftercare: Secondary | ICD-10-CM | POA: Diagnosis not present

## 2017-01-17 DIAGNOSIS — Z79899 Other long term (current) drug therapy: Secondary | ICD-10-CM | POA: Diagnosis not present

## 2017-01-17 DIAGNOSIS — N2581 Secondary hyperparathyroidism of renal origin: Secondary | ICD-10-CM | POA: Diagnosis not present

## 2017-01-17 DIAGNOSIS — D631 Anemia in chronic kidney disease: Secondary | ICD-10-CM | POA: Diagnosis not present

## 2017-01-17 DIAGNOSIS — D509 Iron deficiency anemia, unspecified: Secondary | ICD-10-CM | POA: Diagnosis not present

## 2017-01-17 DIAGNOSIS — N186 End stage renal disease: Secondary | ICD-10-CM | POA: Diagnosis not present

## 2017-01-17 DIAGNOSIS — Z5189 Encounter for other specified aftercare: Secondary | ICD-10-CM | POA: Diagnosis not present

## 2017-01-18 DIAGNOSIS — Z79899 Other long term (current) drug therapy: Secondary | ICD-10-CM | POA: Diagnosis not present

## 2017-01-18 DIAGNOSIS — Z5189 Encounter for other specified aftercare: Secondary | ICD-10-CM | POA: Diagnosis not present

## 2017-01-18 DIAGNOSIS — N186 End stage renal disease: Secondary | ICD-10-CM | POA: Diagnosis not present

## 2017-01-18 DIAGNOSIS — D631 Anemia in chronic kidney disease: Secondary | ICD-10-CM | POA: Diagnosis not present

## 2017-01-18 DIAGNOSIS — D509 Iron deficiency anemia, unspecified: Secondary | ICD-10-CM | POA: Diagnosis not present

## 2017-01-18 DIAGNOSIS — N2581 Secondary hyperparathyroidism of renal origin: Secondary | ICD-10-CM | POA: Diagnosis not present

## 2017-01-19 DIAGNOSIS — Z79899 Other long term (current) drug therapy: Secondary | ICD-10-CM | POA: Diagnosis not present

## 2017-01-19 DIAGNOSIS — N186 End stage renal disease: Secondary | ICD-10-CM | POA: Diagnosis not present

## 2017-01-19 DIAGNOSIS — Z5189 Encounter for other specified aftercare: Secondary | ICD-10-CM | POA: Diagnosis not present

## 2017-01-19 DIAGNOSIS — D509 Iron deficiency anemia, unspecified: Secondary | ICD-10-CM | POA: Diagnosis not present

## 2017-01-19 DIAGNOSIS — D631 Anemia in chronic kidney disease: Secondary | ICD-10-CM | POA: Diagnosis not present

## 2017-01-19 DIAGNOSIS — N2581 Secondary hyperparathyroidism of renal origin: Secondary | ICD-10-CM | POA: Diagnosis not present

## 2017-01-20 DIAGNOSIS — N2581 Secondary hyperparathyroidism of renal origin: Secondary | ICD-10-CM | POA: Diagnosis not present

## 2017-01-20 DIAGNOSIS — N186 End stage renal disease: Secondary | ICD-10-CM | POA: Diagnosis not present

## 2017-01-20 DIAGNOSIS — D631 Anemia in chronic kidney disease: Secondary | ICD-10-CM | POA: Diagnosis not present

## 2017-01-20 DIAGNOSIS — D509 Iron deficiency anemia, unspecified: Secondary | ICD-10-CM | POA: Diagnosis not present

## 2017-01-20 DIAGNOSIS — Z79899 Other long term (current) drug therapy: Secondary | ICD-10-CM | POA: Diagnosis not present

## 2017-01-20 DIAGNOSIS — Z5189 Encounter for other specified aftercare: Secondary | ICD-10-CM | POA: Diagnosis not present

## 2017-01-21 DIAGNOSIS — Z5189 Encounter for other specified aftercare: Secondary | ICD-10-CM | POA: Diagnosis not present

## 2017-01-21 DIAGNOSIS — D631 Anemia in chronic kidney disease: Secondary | ICD-10-CM | POA: Diagnosis not present

## 2017-01-21 DIAGNOSIS — D509 Iron deficiency anemia, unspecified: Secondary | ICD-10-CM | POA: Diagnosis not present

## 2017-01-21 DIAGNOSIS — N2581 Secondary hyperparathyroidism of renal origin: Secondary | ICD-10-CM | POA: Diagnosis not present

## 2017-01-21 DIAGNOSIS — N186 End stage renal disease: Secondary | ICD-10-CM | POA: Diagnosis not present

## 2017-01-21 DIAGNOSIS — Z79899 Other long term (current) drug therapy: Secondary | ICD-10-CM | POA: Diagnosis not present

## 2017-01-22 ENCOUNTER — Encounter (HOSPITAL_COMMUNITY)
Admission: RE | Admit: 2017-01-22 | Discharge: 2017-01-22 | Disposition: A | Discharge status: Home / Self Care | Payer: Medicare Other | Source: Ambulatory Visit | Attending: Urology | Admitting: Urology

## 2017-01-22 DIAGNOSIS — N186 End stage renal disease: Secondary | ICD-10-CM | POA: Diagnosis not present

## 2017-01-22 DIAGNOSIS — Z5189 Encounter for other specified aftercare: Secondary | ICD-10-CM | POA: Diagnosis not present

## 2017-01-22 DIAGNOSIS — D631 Anemia in chronic kidney disease: Secondary | ICD-10-CM | POA: Diagnosis not present

## 2017-01-22 DIAGNOSIS — C61 Malignant neoplasm of prostate: Secondary | ICD-10-CM | POA: Diagnosis not present

## 2017-01-22 DIAGNOSIS — D509 Iron deficiency anemia, unspecified: Secondary | ICD-10-CM | POA: Diagnosis not present

## 2017-01-22 DIAGNOSIS — N2581 Secondary hyperparathyroidism of renal origin: Secondary | ICD-10-CM | POA: Diagnosis not present

## 2017-01-22 DIAGNOSIS — Z79899 Other long term (current) drug therapy: Secondary | ICD-10-CM | POA: Diagnosis not present

## 2017-01-22 MED ORDER — TECHNETIUM TC 99M MEDRONATE IV KIT
25.0000 | PACK | Freq: Once | INTRAVENOUS | Status: AC | PRN
  Administered 2017-01-22: 22 via INTRAVENOUS

## 2017-01-23 ENCOUNTER — Encounter: Payer: Self-pay | Admitting: Family Medicine

## 2017-01-23 ENCOUNTER — Ambulatory Visit (INDEPENDENT_AMBULATORY_CARE_PROVIDER_SITE_OTHER): Payer: Medicare Other | Admitting: Family Medicine

## 2017-01-23 VITALS — BP 122/72 | HR 75 | Wt 171.2 lb

## 2017-01-23 DIAGNOSIS — I10 Essential (primary) hypertension: Secondary | ICD-10-CM

## 2017-01-23 DIAGNOSIS — E785 Hyperlipidemia, unspecified: Secondary | ICD-10-CM

## 2017-01-23 DIAGNOSIS — D509 Iron deficiency anemia, unspecified: Secondary | ICD-10-CM | POA: Diagnosis not present

## 2017-01-23 DIAGNOSIS — E1169 Type 2 diabetes mellitus with other specified complication: Secondary | ICD-10-CM

## 2017-01-23 DIAGNOSIS — N186 End stage renal disease: Secondary | ICD-10-CM | POA: Diagnosis not present

## 2017-01-23 DIAGNOSIS — Z5189 Encounter for other specified aftercare: Secondary | ICD-10-CM | POA: Diagnosis not present

## 2017-01-23 DIAGNOSIS — N2581 Secondary hyperparathyroidism of renal origin: Secondary | ICD-10-CM | POA: Diagnosis not present

## 2017-01-23 DIAGNOSIS — Z79899 Other long term (current) drug therapy: Secondary | ICD-10-CM | POA: Diagnosis not present

## 2017-01-23 DIAGNOSIS — D631 Anemia in chronic kidney disease: Secondary | ICD-10-CM | POA: Diagnosis not present

## 2017-01-23 MED ORDER — ATORVASTATIN CALCIUM 20 MG PO TABS: 20.0000 mg | ORAL_TABLET | Freq: Every day | ORAL | 3 refills | Status: DC

## 2017-01-23 NOTE — Progress Notes (Signed)
   Subjective:    Patient ID: Jared [name], male    DOB: 1955-12-30, 61 y.o.   MRN: [id number]  HPI Chief Complaint  Patient presents with  . 3 week follow-up    3 week follow-up on bP   He is here for follow up on HTN.  States he has not made an appointment with endocrinology yet. Blood sugar this morning was 127. States he has been checking his blood sugar 4-6 times per day. No issues with this.   Dr. Tammi Klippel is his urologist who is managing prostate cancer. States he had a bone scan yesterday.  [facility] - has an appointment with them April 30th.  Nephrologist- Dr. Lorrene Reid. No appointment scheduled at this time. We sent recent lab results to his nephrologist. He is doing daily PD and denies any issues with this.    History of elevated cholesterol. Has not been taking a statin and he is not sure why. Denies any history of problems with medication.      Review of Systems Pertinent positives and negatives in the history of present illness.     Objective:   Physical Exam BP 122/72   Pulse 75   Wt 171 lb 3.2 oz (77.7 kg)   BMI 26.18 kg/m   Alert and oriented and in no acute distress.       Assessment & Plan:  Essential hypertension  Hyperlipidemia associated with type 2 diabetes mellitus ([place]) - Plan: atorvastatin (LIPITOR) 20 MG tablet  Blood pressure is within goal range. Continue on current medication regimen.  He will call and schedule with endocrinology for diabetes management.  Educated him on cholesterol and management with healthy lifestyle. He has been off medication for unknown length of time and plan to start him back on a statin.  Start back on low dose aspirin therapy.  He will call and schedule with nephrologist for ESRD. We forwarded his most recent labs to them.  Follow up in 2 -3 months for fasting labs and follow up on chronic health conditions.

## 2017-01-23 NOTE — Patient Instructions (Addendum)
Call your Nephrologist and find out when your upcoming appt is.   Call and schedule your appointment with [facility].   Follow up in 3 months for blood pressure.    DASH Eating Plan DASH stands for "Dietary Approaches to Stop Hypertension." The DASH eating plan is a healthy eating plan that has been shown to reduce high blood pressure (hypertension). It may also reduce your risk for type 2 diabetes, heart disease, and stroke. The DASH eating plan may also help with weight loss. What are tips for following this plan? General guidelines   Avoid eating more than 2,300 mg (milligrams) of salt (sodium) a day. If you have hypertension, you may need to reduce your sodium intake to 1,500 mg a day.  Limit alcohol intake to no more than 1 drink a day for nonpregnant women and 2 drinks a day for men. One drink equals 12 oz of beer, 5 oz of wine, or 1 oz of hard liquor.  Work with your health care provider to maintain a healthy body weight or to lose weight. Ask what an ideal weight is for you.  Get at least 30 minutes of exercise that causes your heart to beat faster (aerobic exercise) most days of the week. Activities may include walking, swimming, or biking.  Work with your health care provider or diet and nutrition specialist (dietitian) to adjust your eating plan to your individual calorie needs. Reading food labels   Check food labels for the amount of sodium per serving. Choose foods with less than 5 percent of the Daily Value of sodium. Generally, foods with less than 300 mg of sodium per serving fit into this eating plan.  To find whole grains, look for the word "whole" as the first word in the ingredient list. Shopping   Buy products labeled as "low-sodium" or "no salt added."  Buy fresh foods. Avoid canned foods and premade or frozen meals. Cooking   Avoid adding salt when cooking. Use salt-free seasonings or herbs instead of table salt or sea salt. Check with your health  care provider or pharmacist before using salt substitutes.  Do not fry foods. Cook foods using healthy methods such as baking, boiling, grilling, and broiling instead.  Cook with heart-healthy oils, such as olive, canola, soybean, or sunflower oil. Meal planning    Eat a balanced diet that includes:  5 or more servings of fruits and vegetables each day. At each meal, try to fill half of your plate with fruits and vegetables.  Up to 6-8 servings of whole grains each day.  Less than 6 oz of lean meat, poultry, or fish each day. A 3-oz serving of meat is about the same size as a deck of cards. One egg equals 1 oz.  2 servings of low-fat dairy each day.  A serving of nuts, seeds, or beans 5 times each week.  Heart-healthy fats. Healthy fats called Omega-3 fatty acids are found in foods such as flaxseeds and coldwater fish, like sardines, salmon, and mackerel.  Limit how much you eat of the following:  Canned or prepackaged foods.  Food that is high in trans fat, such as fried foods.  Food that is high in saturated fat, such as fatty meat.  Sweets, desserts, sugary drinks, and other foods with added sugar.  Full-fat dairy products.  Do not salt foods before eating.  Try to eat at least 2 vegetarian meals each week.  Eat more home-cooked food and less restaurant, buffet, and fast food.  When eating at a restaurant, ask that your food be prepared with less salt or no salt, if possible. What foods are recommended? The items listed may not be a complete list. Talk with your dietitian about what dietary choices are best for you. Grains  Whole-grain or whole-wheat bread. Whole-grain or whole-wheat pasta. Brown rice. [place]row. Bulgur. Whole-grain and low-sodium cereals. Pita bread. Low-fat, low-sodium crackers. Whole-wheat flour tortillas. Vegetables  Fresh or frozen vegetables (raw, steamed, roasted, or grilled). Low-sodium or reduced-sodium tomato and vegetable juice.  Low-sodium or reduced-sodium tomato sauce and tomato paste. Low-sodium or reduced-sodium canned vegetables. Fruits  All fresh, dried, or frozen fruit. Canned fruit in natural juice (without added sugar). Meat and other protein foods  Skinless chicken or [place]. Ground chicken or [place]. Pork with fat trimmed off. Fish and seafood. Egg whites. Dried beans, peas, or lentils. Unsalted nuts, nut butters, and seeds. Unsalted canned beans. Lean cuts of beef with fat trimmed off. Low-sodium, lean deli meat. Dairy  Low-fat (1%) or fat-free (skim) milk. Fat-free, low-fat, or reduced-fat cheeses. Nonfat, low-sodium ricotta or cottage cheese. Low-fat or nonfat yogurt. Low-fat, low-sodium cheese. Fats and oils  Soft margarine without trans fats. Vegetable oil. Low-fat, reduced-fat, or light mayonnaise and salad dressings (reduced-sodium). Canola, safflower, olive, soybean, and sunflower oils. Avocado. Seasoning and other foods  Herbs. Spices. Seasoning mixes without salt. Unsalted popcorn and pretzels. Fat-free sweets. What foods are not recommended? The items listed may not be a complete list. Talk with your dietitian about what dietary choices are best for you. Grains  Baked goods made with fat, such as croissants, muffins, or some breads. Dry pasta or rice meal packs. Vegetables  Creamed or fried vegetables. Vegetables in a cheese sauce. Regular canned vegetables (not low-sodium or reduced-sodium). Regular canned tomato sauce and paste (not low-sodium or reduced-sodium). Regular tomato and vegetable juice (not low-sodium or reduced-sodium). Angie Fava. Olives. Fruits  Canned fruit in a light or heavy syrup. Fried fruit. Fruit in cream or butter sauce. Meat and other protein foods  Fatty cuts of meat. Ribs. Fried meat. Berniece Salines. Sausage. Bologna and other processed lunch meats. Salami. Fatback. Hotdogs. Bratwurst. Salted nuts and seeds. Canned beans with added salt. Canned or smoked fish. Whole eggs or egg  yolks. Chicken or [place] with skin. Dairy  Whole or 2% milk, cream, and half-and-half. Whole or full-fat cream cheese. Whole-fat or sweetened yogurt. Full-fat cheese. Nondairy creamers. Whipped toppings. Processed cheese and cheese spreads. Fats and oils  Butter. Stick margarine. Lard. Shortening. Ghee. Bacon fat. Tropical oils, such as coconut, palm kernel, or palm oil. Seasoning and other foods  Salted popcorn and pretzels. Onion salt, garlic salt, seasoned salt, table salt, and sea salt. Worcestershire sauce. Tartar sauce. Barbecue sauce. Teriyaki sauce. Soy sauce, including reduced-sodium. Steak sauce. Canned and packaged gravies. Fish sauce. Oyster sauce. Cocktail sauce. Horseradish that you find on the shelf. Ketchup. Mustard. Meat flavorings and tenderizers. Bouillon cubes. Hot sauce and Tabasco sauce. Premade or packaged marinades. Premade or packaged taco seasonings. Relishes. Regular salad dressings. Where to find more information:  National Heart, Lung, and [place]: https://wilson-eaton.com/  American Heart Association: www.heart.org Summary  The DASH eating plan is a healthy eating plan that has been shown to reduce high blood pressure (hypertension). It may also reduce your risk for type 2 diabetes, heart disease, and stroke.  With the DASH eating plan, you should limit salt (sodium) intake to 2,300 mg a day. If you have hypertension, you may need to reduce your  sodium intake to 1,500 mg a day.  When on the DASH eating plan, aim to eat more fresh fruits and vegetables, whole grains, lean proteins, low-fat dairy, and heart-healthy fats.  Work with your health care provider or diet and nutrition specialist (dietitian) to adjust your eating plan to your individual calorie needs. This information is not intended to replace advice given to you by your health care provider. Make sure you discuss any questions you have with your health care provider. Document Released: 11/02/2011 Document  Revised: 11/06/2016 Document Reviewed: 11/06/2016 Elsevier Interactive Patient Education  2017 [employer].

## 2017-01-24 DIAGNOSIS — D631 Anemia in chronic kidney disease: Secondary | ICD-10-CM | POA: Diagnosis not present

## 2017-01-24 DIAGNOSIS — I129 Hypertensive chronic kidney disease with stage 1 through stage 4 chronic kidney disease, or unspecified chronic kidney disease: Secondary | ICD-10-CM | POA: Diagnosis not present

## 2017-01-24 DIAGNOSIS — N2581 Secondary hyperparathyroidism of renal origin: Secondary | ICD-10-CM | POA: Diagnosis not present

## 2017-01-24 DIAGNOSIS — Z992 Dependence on renal dialysis: Secondary | ICD-10-CM | POA: Diagnosis not present

## 2017-01-24 DIAGNOSIS — E7800 Pure hypercholesterolemia, unspecified: Secondary | ICD-10-CM | POA: Insufficient documentation

## 2017-01-24 DIAGNOSIS — Z79899 Other long term (current) drug therapy: Secondary | ICD-10-CM | POA: Diagnosis not present

## 2017-01-24 DIAGNOSIS — D509 Iron deficiency anemia, unspecified: Secondary | ICD-10-CM | POA: Diagnosis not present

## 2017-01-24 DIAGNOSIS — N186 End stage renal disease: Secondary | ICD-10-CM | POA: Diagnosis not present

## 2017-01-24 DIAGNOSIS — Z5189 Encounter for other specified aftercare: Secondary | ICD-10-CM | POA: Diagnosis not present

## 2017-01-25 DIAGNOSIS — K769 Liver disease, unspecified: Secondary | ICD-10-CM | POA: Diagnosis not present

## 2017-01-25 DIAGNOSIS — E440 Moderate protein-calorie malnutrition: Secondary | ICD-10-CM | POA: Diagnosis not present

## 2017-01-25 DIAGNOSIS — Z79899 Other long term (current) drug therapy: Secondary | ICD-10-CM | POA: Diagnosis not present

## 2017-01-25 DIAGNOSIS — D509 Iron deficiency anemia, unspecified: Secondary | ICD-10-CM | POA: Diagnosis not present

## 2017-01-25 DIAGNOSIS — Z5189 Encounter for other specified aftercare: Secondary | ICD-10-CM | POA: Diagnosis not present

## 2017-01-25 DIAGNOSIS — N186 End stage renal disease: Secondary | ICD-10-CM | POA: Diagnosis not present

## 2017-01-25 DIAGNOSIS — D631 Anemia in chronic kidney disease: Secondary | ICD-10-CM | POA: Diagnosis not present

## 2017-01-25 DIAGNOSIS — N2581 Secondary hyperparathyroidism of renal origin: Secondary | ICD-10-CM | POA: Diagnosis not present

## 2017-01-26 DIAGNOSIS — K769 Liver disease, unspecified: Secondary | ICD-10-CM | POA: Diagnosis not present

## 2017-01-26 DIAGNOSIS — E440 Moderate protein-calorie malnutrition: Secondary | ICD-10-CM | POA: Diagnosis not present

## 2017-01-26 DIAGNOSIS — Z79899 Other long term (current) drug therapy: Secondary | ICD-10-CM | POA: Diagnosis not present

## 2017-01-26 DIAGNOSIS — D631 Anemia in chronic kidney disease: Secondary | ICD-10-CM | POA: Diagnosis not present

## 2017-01-26 DIAGNOSIS — C61 Malignant neoplasm of prostate: Secondary | ICD-10-CM | POA: Diagnosis not present

## 2017-01-26 DIAGNOSIS — N186 End stage renal disease: Secondary | ICD-10-CM | POA: Diagnosis not present

## 2017-01-26 DIAGNOSIS — Z5189 Encounter for other specified aftercare: Secondary | ICD-10-CM | POA: Diagnosis not present

## 2017-01-27 DIAGNOSIS — Z79899 Other long term (current) drug therapy: Secondary | ICD-10-CM | POA: Diagnosis not present

## 2017-01-27 DIAGNOSIS — K769 Liver disease, unspecified: Secondary | ICD-10-CM | POA: Diagnosis not present

## 2017-01-27 DIAGNOSIS — N186 End stage renal disease: Secondary | ICD-10-CM | POA: Diagnosis not present

## 2017-01-27 DIAGNOSIS — E440 Moderate protein-calorie malnutrition: Secondary | ICD-10-CM | POA: Diagnosis not present

## 2017-01-27 DIAGNOSIS — Z5189 Encounter for other specified aftercare: Secondary | ICD-10-CM | POA: Diagnosis not present

## 2017-01-27 DIAGNOSIS — D631 Anemia in chronic kidney disease: Secondary | ICD-10-CM | POA: Diagnosis not present

## 2017-01-28 DIAGNOSIS — K769 Liver disease, unspecified: Secondary | ICD-10-CM | POA: Diagnosis not present

## 2017-01-28 DIAGNOSIS — N186 End stage renal disease: Secondary | ICD-10-CM | POA: Diagnosis not present

## 2017-01-28 DIAGNOSIS — E440 Moderate protein-calorie malnutrition: Secondary | ICD-10-CM | POA: Diagnosis not present

## 2017-01-28 DIAGNOSIS — Z5189 Encounter for other specified aftercare: Secondary | ICD-10-CM | POA: Diagnosis not present

## 2017-01-28 DIAGNOSIS — D631 Anemia in chronic kidney disease: Secondary | ICD-10-CM | POA: Diagnosis not present

## 2017-01-28 DIAGNOSIS — Z79899 Other long term (current) drug therapy: Secondary | ICD-10-CM | POA: Diagnosis not present

## 2017-01-29 DIAGNOSIS — K769 Liver disease, unspecified: Secondary | ICD-10-CM | POA: Diagnosis not present

## 2017-01-29 DIAGNOSIS — Z5189 Encounter for other specified aftercare: Secondary | ICD-10-CM | POA: Diagnosis not present

## 2017-01-29 DIAGNOSIS — D631 Anemia in chronic kidney disease: Secondary | ICD-10-CM | POA: Diagnosis not present

## 2017-01-29 DIAGNOSIS — Z79899 Other long term (current) drug therapy: Secondary | ICD-10-CM | POA: Diagnosis not present

## 2017-01-29 DIAGNOSIS — N186 End stage renal disease: Secondary | ICD-10-CM | POA: Diagnosis not present

## 2017-01-29 DIAGNOSIS — R8299 Other abnormal findings in urine: Secondary | ICD-10-CM | POA: Diagnosis not present

## 2017-01-29 DIAGNOSIS — E440 Moderate protein-calorie malnutrition: Secondary | ICD-10-CM | POA: Diagnosis not present

## 2017-01-30 DIAGNOSIS — D631 Anemia in chronic kidney disease: Secondary | ICD-10-CM | POA: Diagnosis not present

## 2017-01-30 DIAGNOSIS — K769 Liver disease, unspecified: Secondary | ICD-10-CM | POA: Diagnosis not present

## 2017-01-30 DIAGNOSIS — E440 Moderate protein-calorie malnutrition: Secondary | ICD-10-CM | POA: Diagnosis not present

## 2017-01-30 DIAGNOSIS — Z79899 Other long term (current) drug therapy: Secondary | ICD-10-CM | POA: Diagnosis not present

## 2017-01-30 DIAGNOSIS — Z5189 Encounter for other specified aftercare: Secondary | ICD-10-CM | POA: Diagnosis not present

## 2017-01-30 DIAGNOSIS — N186 End stage renal disease: Secondary | ICD-10-CM | POA: Diagnosis not present

## 2017-01-31 DIAGNOSIS — D631 Anemia in chronic kidney disease: Secondary | ICD-10-CM | POA: Diagnosis not present

## 2017-01-31 DIAGNOSIS — E440 Moderate protein-calorie malnutrition: Secondary | ICD-10-CM | POA: Diagnosis not present

## 2017-01-31 DIAGNOSIS — Z79899 Other long term (current) drug therapy: Secondary | ICD-10-CM | POA: Diagnosis not present

## 2017-01-31 DIAGNOSIS — K769 Liver disease, unspecified: Secondary | ICD-10-CM | POA: Diagnosis not present

## 2017-01-31 DIAGNOSIS — N186 End stage renal disease: Secondary | ICD-10-CM | POA: Diagnosis not present

## 2017-01-31 DIAGNOSIS — Z5189 Encounter for other specified aftercare: Secondary | ICD-10-CM | POA: Diagnosis not present

## 2017-02-01 DIAGNOSIS — E440 Moderate protein-calorie malnutrition: Secondary | ICD-10-CM | POA: Diagnosis not present

## 2017-02-01 DIAGNOSIS — N186 End stage renal disease: Secondary | ICD-10-CM | POA: Diagnosis not present

## 2017-02-01 DIAGNOSIS — Z5189 Encounter for other specified aftercare: Secondary | ICD-10-CM | POA: Diagnosis not present

## 2017-02-01 DIAGNOSIS — Z79899 Other long term (current) drug therapy: Secondary | ICD-10-CM | POA: Diagnosis not present

## 2017-02-01 DIAGNOSIS — D631 Anemia in chronic kidney disease: Secondary | ICD-10-CM | POA: Diagnosis not present

## 2017-02-01 DIAGNOSIS — K769 Liver disease, unspecified: Secondary | ICD-10-CM | POA: Diagnosis not present

## 2017-02-02 DIAGNOSIS — E440 Moderate protein-calorie malnutrition: Secondary | ICD-10-CM | POA: Diagnosis not present

## 2017-02-02 DIAGNOSIS — D631 Anemia in chronic kidney disease: Secondary | ICD-10-CM | POA: Diagnosis not present

## 2017-02-02 DIAGNOSIS — Z79899 Other long term (current) drug therapy: Secondary | ICD-10-CM | POA: Diagnosis not present

## 2017-02-02 DIAGNOSIS — Z5189 Encounter for other specified aftercare: Secondary | ICD-10-CM | POA: Diagnosis not present

## 2017-02-02 DIAGNOSIS — N186 End stage renal disease: Secondary | ICD-10-CM | POA: Diagnosis not present

## 2017-02-02 DIAGNOSIS — K769 Liver disease, unspecified: Secondary | ICD-10-CM | POA: Diagnosis not present

## 2017-02-03 DIAGNOSIS — K769 Liver disease, unspecified: Secondary | ICD-10-CM | POA: Diagnosis not present

## 2017-02-03 DIAGNOSIS — D631 Anemia in chronic kidney disease: Secondary | ICD-10-CM | POA: Diagnosis not present

## 2017-02-03 DIAGNOSIS — Z5189 Encounter for other specified aftercare: Secondary | ICD-10-CM | POA: Diagnosis not present

## 2017-02-03 DIAGNOSIS — E440 Moderate protein-calorie malnutrition: Secondary | ICD-10-CM | POA: Diagnosis not present

## 2017-02-03 DIAGNOSIS — Z79899 Other long term (current) drug therapy: Secondary | ICD-10-CM | POA: Diagnosis not present

## 2017-02-03 DIAGNOSIS — N186 End stage renal disease: Secondary | ICD-10-CM | POA: Diagnosis not present

## 2017-02-04 DIAGNOSIS — Z79899 Other long term (current) drug therapy: Secondary | ICD-10-CM | POA: Diagnosis not present

## 2017-02-04 DIAGNOSIS — E440 Moderate protein-calorie malnutrition: Secondary | ICD-10-CM | POA: Diagnosis not present

## 2017-02-04 DIAGNOSIS — Z5189 Encounter for other specified aftercare: Secondary | ICD-10-CM | POA: Diagnosis not present

## 2017-02-04 DIAGNOSIS — D631 Anemia in chronic kidney disease: Secondary | ICD-10-CM | POA: Diagnosis not present

## 2017-02-04 DIAGNOSIS — K769 Liver disease, unspecified: Secondary | ICD-10-CM | POA: Diagnosis not present

## 2017-02-04 DIAGNOSIS — N186 End stage renal disease: Secondary | ICD-10-CM | POA: Diagnosis not present

## 2017-02-05 DIAGNOSIS — Z5189 Encounter for other specified aftercare: Secondary | ICD-10-CM | POA: Diagnosis not present

## 2017-02-05 DIAGNOSIS — N186 End stage renal disease: Secondary | ICD-10-CM | POA: Diagnosis not present

## 2017-02-05 DIAGNOSIS — D631 Anemia in chronic kidney disease: Secondary | ICD-10-CM | POA: Diagnosis not present

## 2017-02-05 DIAGNOSIS — Z79899 Other long term (current) drug therapy: Secondary | ICD-10-CM | POA: Diagnosis not present

## 2017-02-05 DIAGNOSIS — K769 Liver disease, unspecified: Secondary | ICD-10-CM | POA: Diagnosis not present

## 2017-02-05 DIAGNOSIS — E440 Moderate protein-calorie malnutrition: Secondary | ICD-10-CM | POA: Diagnosis not present

## 2017-02-06 DIAGNOSIS — Z5189 Encounter for other specified aftercare: Secondary | ICD-10-CM | POA: Diagnosis not present

## 2017-02-06 DIAGNOSIS — K769 Liver disease, unspecified: Secondary | ICD-10-CM | POA: Diagnosis not present

## 2017-02-06 DIAGNOSIS — E440 Moderate protein-calorie malnutrition: Secondary | ICD-10-CM | POA: Diagnosis not present

## 2017-02-06 DIAGNOSIS — N186 End stage renal disease: Secondary | ICD-10-CM | POA: Diagnosis not present

## 2017-02-06 DIAGNOSIS — D631 Anemia in chronic kidney disease: Secondary | ICD-10-CM | POA: Diagnosis not present

## 2017-02-06 DIAGNOSIS — Z79899 Other long term (current) drug therapy: Secondary | ICD-10-CM | POA: Diagnosis not present

## 2017-02-07 DIAGNOSIS — E440 Moderate protein-calorie malnutrition: Secondary | ICD-10-CM | POA: Diagnosis not present

## 2017-02-07 DIAGNOSIS — N186 End stage renal disease: Secondary | ICD-10-CM | POA: Diagnosis not present

## 2017-02-07 DIAGNOSIS — D631 Anemia in chronic kidney disease: Secondary | ICD-10-CM | POA: Diagnosis not present

## 2017-02-07 DIAGNOSIS — Z5189 Encounter for other specified aftercare: Secondary | ICD-10-CM | POA: Diagnosis not present

## 2017-02-07 DIAGNOSIS — Z79899 Other long term (current) drug therapy: Secondary | ICD-10-CM | POA: Diagnosis not present

## 2017-02-07 DIAGNOSIS — K769 Liver disease, unspecified: Secondary | ICD-10-CM | POA: Diagnosis not present

## 2017-02-08 DIAGNOSIS — E440 Moderate protein-calorie malnutrition: Secondary | ICD-10-CM | POA: Diagnosis not present

## 2017-02-08 DIAGNOSIS — Z5189 Encounter for other specified aftercare: Secondary | ICD-10-CM | POA: Diagnosis not present

## 2017-02-08 DIAGNOSIS — K769 Liver disease, unspecified: Secondary | ICD-10-CM | POA: Diagnosis not present

## 2017-02-08 DIAGNOSIS — D631 Anemia in chronic kidney disease: Secondary | ICD-10-CM | POA: Diagnosis not present

## 2017-02-08 DIAGNOSIS — N186 End stage renal disease: Secondary | ICD-10-CM | POA: Diagnosis not present

## 2017-02-08 DIAGNOSIS — Z79899 Other long term (current) drug therapy: Secondary | ICD-10-CM | POA: Diagnosis not present

## 2017-02-09 DIAGNOSIS — N186 End stage renal disease: Secondary | ICD-10-CM | POA: Diagnosis not present

## 2017-02-09 DIAGNOSIS — K769 Liver disease, unspecified: Secondary | ICD-10-CM | POA: Diagnosis not present

## 2017-02-09 DIAGNOSIS — D631 Anemia in chronic kidney disease: Secondary | ICD-10-CM | POA: Diagnosis not present

## 2017-02-09 DIAGNOSIS — E440 Moderate protein-calorie malnutrition: Secondary | ICD-10-CM | POA: Diagnosis not present

## 2017-02-09 DIAGNOSIS — Z5189 Encounter for other specified aftercare: Secondary | ICD-10-CM | POA: Diagnosis not present

## 2017-02-09 DIAGNOSIS — Z79899 Other long term (current) drug therapy: Secondary | ICD-10-CM | POA: Diagnosis not present

## 2017-02-10 DIAGNOSIS — K769 Liver disease, unspecified: Secondary | ICD-10-CM | POA: Diagnosis not present

## 2017-02-10 DIAGNOSIS — Z79899 Other long term (current) drug therapy: Secondary | ICD-10-CM | POA: Diagnosis not present

## 2017-02-10 DIAGNOSIS — E440 Moderate protein-calorie malnutrition: Secondary | ICD-10-CM | POA: Diagnosis not present

## 2017-02-10 DIAGNOSIS — Z5189 Encounter for other specified aftercare: Secondary | ICD-10-CM | POA: Diagnosis not present

## 2017-02-10 DIAGNOSIS — N186 End stage renal disease: Secondary | ICD-10-CM | POA: Diagnosis not present

## 2017-02-10 DIAGNOSIS — D631 Anemia in chronic kidney disease: Secondary | ICD-10-CM | POA: Diagnosis not present

## 2017-02-11 DIAGNOSIS — Z79899 Other long term (current) drug therapy: Secondary | ICD-10-CM | POA: Diagnosis not present

## 2017-02-11 DIAGNOSIS — K769 Liver disease, unspecified: Secondary | ICD-10-CM | POA: Diagnosis not present

## 2017-02-11 DIAGNOSIS — D631 Anemia in chronic kidney disease: Secondary | ICD-10-CM | POA: Diagnosis not present

## 2017-02-11 DIAGNOSIS — N186 End stage renal disease: Secondary | ICD-10-CM | POA: Diagnosis not present

## 2017-02-11 DIAGNOSIS — E440 Moderate protein-calorie malnutrition: Secondary | ICD-10-CM | POA: Diagnosis not present

## 2017-02-11 DIAGNOSIS — Z5189 Encounter for other specified aftercare: Secondary | ICD-10-CM | POA: Diagnosis not present

## 2017-02-12 DIAGNOSIS — Z5189 Encounter for other specified aftercare: Secondary | ICD-10-CM | POA: Diagnosis not present

## 2017-02-12 DIAGNOSIS — Z79899 Other long term (current) drug therapy: Secondary | ICD-10-CM | POA: Diagnosis not present

## 2017-02-12 DIAGNOSIS — N186 End stage renal disease: Secondary | ICD-10-CM | POA: Diagnosis not present

## 2017-02-12 DIAGNOSIS — D631 Anemia in chronic kidney disease: Secondary | ICD-10-CM | POA: Diagnosis not present

## 2017-02-12 DIAGNOSIS — E440 Moderate protein-calorie malnutrition: Secondary | ICD-10-CM | POA: Diagnosis not present

## 2017-02-12 DIAGNOSIS — K769 Liver disease, unspecified: Secondary | ICD-10-CM | POA: Diagnosis not present

## 2017-02-13 DIAGNOSIS — E440 Moderate protein-calorie malnutrition: Secondary | ICD-10-CM | POA: Diagnosis not present

## 2017-02-13 DIAGNOSIS — D631 Anemia in chronic kidney disease: Secondary | ICD-10-CM | POA: Diagnosis not present

## 2017-02-13 DIAGNOSIS — N186 End stage renal disease: Secondary | ICD-10-CM | POA: Diagnosis not present

## 2017-02-13 DIAGNOSIS — K769 Liver disease, unspecified: Secondary | ICD-10-CM | POA: Diagnosis not present

## 2017-02-13 DIAGNOSIS — Z5189 Encounter for other specified aftercare: Secondary | ICD-10-CM | POA: Diagnosis not present

## 2017-02-13 DIAGNOSIS — Z79899 Other long term (current) drug therapy: Secondary | ICD-10-CM | POA: Diagnosis not present

## 2017-02-14 DIAGNOSIS — Z79899 Other long term (current) drug therapy: Secondary | ICD-10-CM | POA: Diagnosis not present

## 2017-02-14 DIAGNOSIS — E440 Moderate protein-calorie malnutrition: Secondary | ICD-10-CM | POA: Diagnosis not present

## 2017-02-14 DIAGNOSIS — N186 End stage renal disease: Secondary | ICD-10-CM | POA: Diagnosis not present

## 2017-02-14 DIAGNOSIS — Z5189 Encounter for other specified aftercare: Secondary | ICD-10-CM | POA: Diagnosis not present

## 2017-02-14 DIAGNOSIS — K769 Liver disease, unspecified: Secondary | ICD-10-CM | POA: Diagnosis not present

## 2017-02-14 DIAGNOSIS — D631 Anemia in chronic kidney disease: Secondary | ICD-10-CM | POA: Diagnosis not present

## 2017-02-15 DIAGNOSIS — Z79899 Other long term (current) drug therapy: Secondary | ICD-10-CM | POA: Diagnosis not present

## 2017-02-15 DIAGNOSIS — E440 Moderate protein-calorie malnutrition: Secondary | ICD-10-CM | POA: Diagnosis not present

## 2017-02-15 DIAGNOSIS — Z5189 Encounter for other specified aftercare: Secondary | ICD-10-CM | POA: Diagnosis not present

## 2017-02-15 DIAGNOSIS — D631 Anemia in chronic kidney disease: Secondary | ICD-10-CM | POA: Diagnosis not present

## 2017-02-15 DIAGNOSIS — K769 Liver disease, unspecified: Secondary | ICD-10-CM | POA: Diagnosis not present

## 2017-02-15 DIAGNOSIS — N186 End stage renal disease: Secondary | ICD-10-CM | POA: Diagnosis not present

## 2017-02-16 DIAGNOSIS — Z79899 Other long term (current) drug therapy: Secondary | ICD-10-CM | POA: Diagnosis not present

## 2017-02-16 DIAGNOSIS — E440 Moderate protein-calorie malnutrition: Secondary | ICD-10-CM | POA: Diagnosis not present

## 2017-02-16 DIAGNOSIS — K769 Liver disease, unspecified: Secondary | ICD-10-CM | POA: Diagnosis not present

## 2017-02-16 DIAGNOSIS — Z5189 Encounter for other specified aftercare: Secondary | ICD-10-CM | POA: Diagnosis not present

## 2017-02-16 DIAGNOSIS — D631 Anemia in chronic kidney disease: Secondary | ICD-10-CM | POA: Diagnosis not present

## 2017-02-16 DIAGNOSIS — N186 End stage renal disease: Secondary | ICD-10-CM | POA: Diagnosis not present

## 2017-02-17 DIAGNOSIS — N186 End stage renal disease: Secondary | ICD-10-CM | POA: Diagnosis not present

## 2017-02-17 DIAGNOSIS — Z5189 Encounter for other specified aftercare: Secondary | ICD-10-CM | POA: Diagnosis not present

## 2017-02-17 DIAGNOSIS — K769 Liver disease, unspecified: Secondary | ICD-10-CM | POA: Diagnosis not present

## 2017-02-17 DIAGNOSIS — D631 Anemia in chronic kidney disease: Secondary | ICD-10-CM | POA: Diagnosis not present

## 2017-02-17 DIAGNOSIS — Z79899 Other long term (current) drug therapy: Secondary | ICD-10-CM | POA: Diagnosis not present

## 2017-02-17 DIAGNOSIS — E440 Moderate protein-calorie malnutrition: Secondary | ICD-10-CM | POA: Diagnosis not present

## 2017-02-18 DIAGNOSIS — N186 End stage renal disease: Secondary | ICD-10-CM | POA: Diagnosis not present

## 2017-02-18 DIAGNOSIS — D631 Anemia in chronic kidney disease: Secondary | ICD-10-CM | POA: Diagnosis not present

## 2017-02-18 DIAGNOSIS — K769 Liver disease, unspecified: Secondary | ICD-10-CM | POA: Diagnosis not present

## 2017-02-18 DIAGNOSIS — Z5189 Encounter for other specified aftercare: Secondary | ICD-10-CM | POA: Diagnosis not present

## 2017-02-18 DIAGNOSIS — E440 Moderate protein-calorie malnutrition: Secondary | ICD-10-CM | POA: Diagnosis not present

## 2017-02-18 DIAGNOSIS — Z79899 Other long term (current) drug therapy: Secondary | ICD-10-CM | POA: Diagnosis not present

## 2017-02-19 DIAGNOSIS — Z79899 Other long term (current) drug therapy: Secondary | ICD-10-CM | POA: Diagnosis not present

## 2017-02-19 DIAGNOSIS — D631 Anemia in chronic kidney disease: Secondary | ICD-10-CM | POA: Diagnosis not present

## 2017-02-19 DIAGNOSIS — K769 Liver disease, unspecified: Secondary | ICD-10-CM | POA: Diagnosis not present

## 2017-02-19 DIAGNOSIS — N186 End stage renal disease: Secondary | ICD-10-CM | POA: Diagnosis not present

## 2017-02-19 DIAGNOSIS — Z5189 Encounter for other specified aftercare: Secondary | ICD-10-CM | POA: Diagnosis not present

## 2017-02-19 DIAGNOSIS — E440 Moderate protein-calorie malnutrition: Secondary | ICD-10-CM | POA: Diagnosis not present

## 2017-02-20 DIAGNOSIS — E440 Moderate protein-calorie malnutrition: Secondary | ICD-10-CM | POA: Diagnosis not present

## 2017-02-20 DIAGNOSIS — Z5189 Encounter for other specified aftercare: Secondary | ICD-10-CM | POA: Diagnosis not present

## 2017-02-20 DIAGNOSIS — Z79899 Other long term (current) drug therapy: Secondary | ICD-10-CM | POA: Diagnosis not present

## 2017-02-20 DIAGNOSIS — D631 Anemia in chronic kidney disease: Secondary | ICD-10-CM | POA: Diagnosis not present

## 2017-02-20 DIAGNOSIS — N186 End stage renal disease: Secondary | ICD-10-CM | POA: Diagnosis not present

## 2017-02-20 DIAGNOSIS — K769 Liver disease, unspecified: Secondary | ICD-10-CM | POA: Diagnosis not present

## 2017-02-21 DIAGNOSIS — K769 Liver disease, unspecified: Secondary | ICD-10-CM | POA: Diagnosis not present

## 2017-02-21 DIAGNOSIS — N186 End stage renal disease: Secondary | ICD-10-CM | POA: Diagnosis not present

## 2017-02-21 DIAGNOSIS — Z5189 Encounter for other specified aftercare: Secondary | ICD-10-CM | POA: Diagnosis not present

## 2017-02-21 DIAGNOSIS — D631 Anemia in chronic kidney disease: Secondary | ICD-10-CM | POA: Diagnosis not present

## 2017-02-21 DIAGNOSIS — Z79899 Other long term (current) drug therapy: Secondary | ICD-10-CM | POA: Diagnosis not present

## 2017-02-21 DIAGNOSIS — E440 Moderate protein-calorie malnutrition: Secondary | ICD-10-CM | POA: Diagnosis not present

## 2017-02-22 DIAGNOSIS — Z79899 Other long term (current) drug therapy: Secondary | ICD-10-CM | POA: Diagnosis not present

## 2017-02-22 DIAGNOSIS — Z5189 Encounter for other specified aftercare: Secondary | ICD-10-CM | POA: Diagnosis not present

## 2017-02-22 DIAGNOSIS — K769 Liver disease, unspecified: Secondary | ICD-10-CM | POA: Diagnosis not present

## 2017-02-22 DIAGNOSIS — D631 Anemia in chronic kidney disease: Secondary | ICD-10-CM | POA: Diagnosis not present

## 2017-02-22 DIAGNOSIS — E440 Moderate protein-calorie malnutrition: Secondary | ICD-10-CM | POA: Diagnosis not present

## 2017-02-22 DIAGNOSIS — N186 End stage renal disease: Secondary | ICD-10-CM | POA: Diagnosis not present

## 2017-02-23 DIAGNOSIS — D631 Anemia in chronic kidney disease: Secondary | ICD-10-CM | POA: Diagnosis not present

## 2017-02-23 DIAGNOSIS — E440 Moderate protein-calorie malnutrition: Secondary | ICD-10-CM | POA: Diagnosis not present

## 2017-02-23 DIAGNOSIS — N186 End stage renal disease: Secondary | ICD-10-CM | POA: Diagnosis not present

## 2017-02-23 DIAGNOSIS — Z79899 Other long term (current) drug therapy: Secondary | ICD-10-CM | POA: Diagnosis not present

## 2017-02-23 DIAGNOSIS — Z5189 Encounter for other specified aftercare: Secondary | ICD-10-CM | POA: Diagnosis not present

## 2017-02-23 DIAGNOSIS — K769 Liver disease, unspecified: Secondary | ICD-10-CM | POA: Diagnosis not present

## 2017-02-24 DIAGNOSIS — D631 Anemia in chronic kidney disease: Secondary | ICD-10-CM | POA: Diagnosis not present

## 2017-02-24 DIAGNOSIS — Z79899 Other long term (current) drug therapy: Secondary | ICD-10-CM | POA: Diagnosis not present

## 2017-02-24 DIAGNOSIS — K769 Liver disease, unspecified: Secondary | ICD-10-CM | POA: Diagnosis not present

## 2017-02-24 DIAGNOSIS — E440 Moderate protein-calorie malnutrition: Secondary | ICD-10-CM | POA: Diagnosis not present

## 2017-02-24 DIAGNOSIS — I129 Hypertensive chronic kidney disease with stage 1 through stage 4 chronic kidney disease, or unspecified chronic kidney disease: Secondary | ICD-10-CM | POA: Diagnosis not present

## 2017-02-24 DIAGNOSIS — Z992 Dependence on renal dialysis: Secondary | ICD-10-CM | POA: Diagnosis not present

## 2017-02-24 DIAGNOSIS — N186 End stage renal disease: Secondary | ICD-10-CM | POA: Diagnosis not present

## 2017-02-24 DIAGNOSIS — Z5189 Encounter for other specified aftercare: Secondary | ICD-10-CM | POA: Diagnosis not present

## 2017-02-25 DIAGNOSIS — D509 Iron deficiency anemia, unspecified: Secondary | ICD-10-CM | POA: Diagnosis not present

## 2017-02-25 DIAGNOSIS — N186 End stage renal disease: Secondary | ICD-10-CM | POA: Diagnosis not present

## 2017-02-25 DIAGNOSIS — N2581 Secondary hyperparathyroidism of renal origin: Secondary | ICD-10-CM | POA: Diagnosis not present

## 2017-02-25 DIAGNOSIS — D631 Anemia in chronic kidney disease: Secondary | ICD-10-CM | POA: Diagnosis not present

## 2017-02-25 DIAGNOSIS — N2589 Other disorders resulting from impaired renal tubular function: Secondary | ICD-10-CM | POA: Diagnosis not present

## 2017-02-25 DIAGNOSIS — K769 Liver disease, unspecified: Secondary | ICD-10-CM | POA: Diagnosis not present

## 2017-02-26 DIAGNOSIS — D631 Anemia in chronic kidney disease: Secondary | ICD-10-CM | POA: Diagnosis not present

## 2017-02-26 DIAGNOSIS — N2589 Other disorders resulting from impaired renal tubular function: Secondary | ICD-10-CM | POA: Diagnosis not present

## 2017-02-26 DIAGNOSIS — N2581 Secondary hyperparathyroidism of renal origin: Secondary | ICD-10-CM | POA: Diagnosis not present

## 2017-02-26 DIAGNOSIS — N186 End stage renal disease: Secondary | ICD-10-CM | POA: Diagnosis not present

## 2017-02-26 DIAGNOSIS — K769 Liver disease, unspecified: Secondary | ICD-10-CM | POA: Diagnosis not present

## 2017-02-26 DIAGNOSIS — D509 Iron deficiency anemia, unspecified: Secondary | ICD-10-CM | POA: Diagnosis not present

## 2017-02-27 DIAGNOSIS — N2589 Other disorders resulting from impaired renal tubular function: Secondary | ICD-10-CM | POA: Diagnosis not present

## 2017-02-27 DIAGNOSIS — D509 Iron deficiency anemia, unspecified: Secondary | ICD-10-CM | POA: Diagnosis not present

## 2017-02-27 DIAGNOSIS — K769 Liver disease, unspecified: Secondary | ICD-10-CM | POA: Diagnosis not present

## 2017-02-27 DIAGNOSIS — N186 End stage renal disease: Secondary | ICD-10-CM | POA: Diagnosis not present

## 2017-02-27 DIAGNOSIS — D631 Anemia in chronic kidney disease: Secondary | ICD-10-CM | POA: Diagnosis not present

## 2017-02-27 DIAGNOSIS — N2581 Secondary hyperparathyroidism of renal origin: Secondary | ICD-10-CM | POA: Diagnosis not present

## 2017-02-28 DIAGNOSIS — R8299 Other abnormal findings in urine: Secondary | ICD-10-CM | POA: Diagnosis not present

## 2017-02-28 DIAGNOSIS — K769 Liver disease, unspecified: Secondary | ICD-10-CM | POA: Diagnosis not present

## 2017-02-28 DIAGNOSIS — N2581 Secondary hyperparathyroidism of renal origin: Secondary | ICD-10-CM | POA: Diagnosis not present

## 2017-02-28 DIAGNOSIS — N186 End stage renal disease: Secondary | ICD-10-CM | POA: Diagnosis not present

## 2017-02-28 DIAGNOSIS — N2589 Other disorders resulting from impaired renal tubular function: Secondary | ICD-10-CM | POA: Diagnosis not present

## 2017-02-28 DIAGNOSIS — D509 Iron deficiency anemia, unspecified: Secondary | ICD-10-CM | POA: Diagnosis not present

## 2017-02-28 DIAGNOSIS — E784 Other hyperlipidemia: Secondary | ICD-10-CM | POA: Diagnosis not present

## 2017-02-28 DIAGNOSIS — E1129 Type 2 diabetes mellitus with other diabetic kidney complication: Secondary | ICD-10-CM | POA: Diagnosis not present

## 2017-02-28 DIAGNOSIS — D631 Anemia in chronic kidney disease: Secondary | ICD-10-CM | POA: Diagnosis not present

## 2017-03-01 DIAGNOSIS — K769 Liver disease, unspecified: Secondary | ICD-10-CM | POA: Diagnosis not present

## 2017-03-01 DIAGNOSIS — D631 Anemia in chronic kidney disease: Secondary | ICD-10-CM | POA: Diagnosis not present

## 2017-03-01 DIAGNOSIS — N2581 Secondary hyperparathyroidism of renal origin: Secondary | ICD-10-CM | POA: Diagnosis not present

## 2017-03-01 DIAGNOSIS — N186 End stage renal disease: Secondary | ICD-10-CM | POA: Diagnosis not present

## 2017-03-01 DIAGNOSIS — N2589 Other disorders resulting from impaired renal tubular function: Secondary | ICD-10-CM | POA: Diagnosis not present

## 2017-03-01 DIAGNOSIS — D509 Iron deficiency anemia, unspecified: Secondary | ICD-10-CM | POA: Diagnosis not present

## 2017-03-02 DIAGNOSIS — D509 Iron deficiency anemia, unspecified: Secondary | ICD-10-CM | POA: Diagnosis not present

## 2017-03-02 DIAGNOSIS — N2581 Secondary hyperparathyroidism of renal origin: Secondary | ICD-10-CM | POA: Diagnosis not present

## 2017-03-02 DIAGNOSIS — N186 End stage renal disease: Secondary | ICD-10-CM | POA: Diagnosis not present

## 2017-03-02 DIAGNOSIS — N2589 Other disorders resulting from impaired renal tubular function: Secondary | ICD-10-CM | POA: Diagnosis not present

## 2017-03-02 DIAGNOSIS — D631 Anemia in chronic kidney disease: Secondary | ICD-10-CM | POA: Diagnosis not present

## 2017-03-02 DIAGNOSIS — K769 Liver disease, unspecified: Secondary | ICD-10-CM | POA: Diagnosis not present

## 2017-03-03 DIAGNOSIS — K769 Liver disease, unspecified: Secondary | ICD-10-CM | POA: Diagnosis not present

## 2017-03-03 DIAGNOSIS — D509 Iron deficiency anemia, unspecified: Secondary | ICD-10-CM | POA: Diagnosis not present

## 2017-03-03 DIAGNOSIS — N2589 Other disorders resulting from impaired renal tubular function: Secondary | ICD-10-CM | POA: Diagnosis not present

## 2017-03-03 DIAGNOSIS — N186 End stage renal disease: Secondary | ICD-10-CM | POA: Diagnosis not present

## 2017-03-03 DIAGNOSIS — N2581 Secondary hyperparathyroidism of renal origin: Secondary | ICD-10-CM | POA: Diagnosis not present

## 2017-03-03 DIAGNOSIS — D631 Anemia in chronic kidney disease: Secondary | ICD-10-CM | POA: Diagnosis not present

## 2017-03-04 DIAGNOSIS — D631 Anemia in chronic kidney disease: Secondary | ICD-10-CM | POA: Diagnosis not present

## 2017-03-04 DIAGNOSIS — N186 End stage renal disease: Secondary | ICD-10-CM | POA: Diagnosis not present

## 2017-03-04 DIAGNOSIS — D509 Iron deficiency anemia, unspecified: Secondary | ICD-10-CM | POA: Diagnosis not present

## 2017-03-04 DIAGNOSIS — N2589 Other disorders resulting from impaired renal tubular function: Secondary | ICD-10-CM | POA: Diagnosis not present

## 2017-03-04 DIAGNOSIS — N2581 Secondary hyperparathyroidism of renal origin: Secondary | ICD-10-CM | POA: Diagnosis not present

## 2017-03-04 DIAGNOSIS — K769 Liver disease, unspecified: Secondary | ICD-10-CM | POA: Diagnosis not present

## 2017-03-05 DIAGNOSIS — N2589 Other disorders resulting from impaired renal tubular function: Secondary | ICD-10-CM | POA: Diagnosis not present

## 2017-03-05 DIAGNOSIS — D631 Anemia in chronic kidney disease: Secondary | ICD-10-CM | POA: Diagnosis not present

## 2017-03-05 DIAGNOSIS — N186 End stage renal disease: Secondary | ICD-10-CM | POA: Diagnosis not present

## 2017-03-05 DIAGNOSIS — K769 Liver disease, unspecified: Secondary | ICD-10-CM | POA: Diagnosis not present

## 2017-03-05 DIAGNOSIS — D509 Iron deficiency anemia, unspecified: Secondary | ICD-10-CM | POA: Diagnosis not present

## 2017-03-05 DIAGNOSIS — N2581 Secondary hyperparathyroidism of renal origin: Secondary | ICD-10-CM | POA: Diagnosis not present

## 2017-03-06 DIAGNOSIS — N186 End stage renal disease: Secondary | ICD-10-CM | POA: Diagnosis not present

## 2017-03-06 DIAGNOSIS — D631 Anemia in chronic kidney disease: Secondary | ICD-10-CM | POA: Diagnosis not present

## 2017-03-06 DIAGNOSIS — N2589 Other disorders resulting from impaired renal tubular function: Secondary | ICD-10-CM | POA: Diagnosis not present

## 2017-03-06 DIAGNOSIS — K769 Liver disease, unspecified: Secondary | ICD-10-CM | POA: Diagnosis not present

## 2017-03-06 DIAGNOSIS — D509 Iron deficiency anemia, unspecified: Secondary | ICD-10-CM | POA: Diagnosis not present

## 2017-03-06 DIAGNOSIS — N2581 Secondary hyperparathyroidism of renal origin: Secondary | ICD-10-CM | POA: Diagnosis not present

## 2017-03-07 DIAGNOSIS — N2589 Other disorders resulting from impaired renal tubular function: Secondary | ICD-10-CM | POA: Diagnosis not present

## 2017-03-07 DIAGNOSIS — D631 Anemia in chronic kidney disease: Secondary | ICD-10-CM | POA: Diagnosis not present

## 2017-03-07 DIAGNOSIS — N186 End stage renal disease: Secondary | ICD-10-CM | POA: Diagnosis not present

## 2017-03-07 DIAGNOSIS — K769 Liver disease, unspecified: Secondary | ICD-10-CM | POA: Diagnosis not present

## 2017-03-07 DIAGNOSIS — D509 Iron deficiency anemia, unspecified: Secondary | ICD-10-CM | POA: Diagnosis not present

## 2017-03-07 DIAGNOSIS — N2581 Secondary hyperparathyroidism of renal origin: Secondary | ICD-10-CM | POA: Diagnosis not present

## 2017-03-08 DIAGNOSIS — N186 End stage renal disease: Secondary | ICD-10-CM | POA: Diagnosis not present

## 2017-03-08 DIAGNOSIS — K769 Liver disease, unspecified: Secondary | ICD-10-CM | POA: Diagnosis not present

## 2017-03-08 DIAGNOSIS — N2581 Secondary hyperparathyroidism of renal origin: Secondary | ICD-10-CM | POA: Diagnosis not present

## 2017-03-08 DIAGNOSIS — D631 Anemia in chronic kidney disease: Secondary | ICD-10-CM | POA: Diagnosis not present

## 2017-03-08 DIAGNOSIS — N2589 Other disorders resulting from impaired renal tubular function: Secondary | ICD-10-CM | POA: Diagnosis not present

## 2017-03-08 DIAGNOSIS — D509 Iron deficiency anemia, unspecified: Secondary | ICD-10-CM | POA: Diagnosis not present

## 2017-03-09 DIAGNOSIS — K769 Liver disease, unspecified: Secondary | ICD-10-CM | POA: Diagnosis not present

## 2017-03-09 DIAGNOSIS — N2589 Other disorders resulting from impaired renal tubular function: Secondary | ICD-10-CM | POA: Diagnosis not present

## 2017-03-09 DIAGNOSIS — D509 Iron deficiency anemia, unspecified: Secondary | ICD-10-CM | POA: Diagnosis not present

## 2017-03-09 DIAGNOSIS — D631 Anemia in chronic kidney disease: Secondary | ICD-10-CM | POA: Diagnosis not present

## 2017-03-09 DIAGNOSIS — N186 End stage renal disease: Secondary | ICD-10-CM | POA: Diagnosis not present

## 2017-03-09 DIAGNOSIS — N2581 Secondary hyperparathyroidism of renal origin: Secondary | ICD-10-CM | POA: Diagnosis not present

## 2017-03-10 DIAGNOSIS — K769 Liver disease, unspecified: Secondary | ICD-10-CM | POA: Diagnosis not present

## 2017-03-10 DIAGNOSIS — N2589 Other disorders resulting from impaired renal tubular function: Secondary | ICD-10-CM | POA: Diagnosis not present

## 2017-03-10 DIAGNOSIS — D509 Iron deficiency anemia, unspecified: Secondary | ICD-10-CM | POA: Diagnosis not present

## 2017-03-10 DIAGNOSIS — N186 End stage renal disease: Secondary | ICD-10-CM | POA: Diagnosis not present

## 2017-03-10 DIAGNOSIS — N2581 Secondary hyperparathyroidism of renal origin: Secondary | ICD-10-CM | POA: Diagnosis not present

## 2017-03-10 DIAGNOSIS — D631 Anemia in chronic kidney disease: Secondary | ICD-10-CM | POA: Diagnosis not present

## 2017-03-11 DIAGNOSIS — N2589 Other disorders resulting from impaired renal tubular function: Secondary | ICD-10-CM | POA: Diagnosis not present

## 2017-03-11 DIAGNOSIS — D509 Iron deficiency anemia, unspecified: Secondary | ICD-10-CM | POA: Diagnosis not present

## 2017-03-11 DIAGNOSIS — N186 End stage renal disease: Secondary | ICD-10-CM | POA: Diagnosis not present

## 2017-03-11 DIAGNOSIS — K769 Liver disease, unspecified: Secondary | ICD-10-CM | POA: Diagnosis not present

## 2017-03-11 DIAGNOSIS — D631 Anemia in chronic kidney disease: Secondary | ICD-10-CM | POA: Diagnosis not present

## 2017-03-11 DIAGNOSIS — N2581 Secondary hyperparathyroidism of renal origin: Secondary | ICD-10-CM | POA: Diagnosis not present

## 2017-03-12 DIAGNOSIS — D509 Iron deficiency anemia, unspecified: Secondary | ICD-10-CM | POA: Diagnosis not present

## 2017-03-12 DIAGNOSIS — N2581 Secondary hyperparathyroidism of renal origin: Secondary | ICD-10-CM | POA: Diagnosis not present

## 2017-03-12 DIAGNOSIS — N186 End stage renal disease: Secondary | ICD-10-CM | POA: Diagnosis not present

## 2017-03-12 DIAGNOSIS — N2589 Other disorders resulting from impaired renal tubular function: Secondary | ICD-10-CM | POA: Diagnosis not present

## 2017-03-12 DIAGNOSIS — D631 Anemia in chronic kidney disease: Secondary | ICD-10-CM | POA: Diagnosis not present

## 2017-03-12 DIAGNOSIS — K769 Liver disease, unspecified: Secondary | ICD-10-CM | POA: Diagnosis not present

## 2017-03-13 ENCOUNTER — Inpatient Hospital Stay (HOSPITAL_COMMUNITY)
Admission: EM | Admit: 2017-03-13 | Discharge: 2017-03-21 | DRG: 070 | Disposition: A | Discharge status: Rehabilitation Facility | Payer: Medicare Other | Attending: Internal Medicine | Admitting: Internal Medicine

## 2017-03-13 ENCOUNTER — Inpatient Hospital Stay (HOSPITAL_COMMUNITY): Payer: Medicare Other

## 2017-03-13 DIAGNOSIS — Z794 Long term (current) use of insulin: Secondary | ICD-10-CM

## 2017-03-13 DIAGNOSIS — M79609 Pain in unspecified limb: Secondary | ICD-10-CM | POA: Diagnosis not present

## 2017-03-13 DIAGNOSIS — Z801 Family history of malignant neoplasm of trachea, bronchus and lung: Secondary | ICD-10-CM | POA: Diagnosis not present

## 2017-03-13 DIAGNOSIS — Z809 Family history of malignant neoplasm, unspecified: Secondary | ICD-10-CM | POA: Diagnosis not present

## 2017-03-13 DIAGNOSIS — E1165 Type 2 diabetes mellitus with hyperglycemia: Secondary | ICD-10-CM | POA: Diagnosis present

## 2017-03-13 DIAGNOSIS — E1122 Type 2 diabetes mellitus with diabetic chronic kidney disease: Secondary | ICD-10-CM | POA: Diagnosis not present

## 2017-03-13 DIAGNOSIS — D62 Acute posthemorrhagic anemia: Secondary | ICD-10-CM | POA: Diagnosis not present

## 2017-03-13 DIAGNOSIS — E46 Unspecified protein-calorie malnutrition: Secondary | ICD-10-CM | POA: Diagnosis present

## 2017-03-13 DIAGNOSIS — I517 Cardiomegaly: Secondary | ICD-10-CM | POA: Diagnosis not present

## 2017-03-13 DIAGNOSIS — I10 Essential (primary) hypertension: Secondary | ICD-10-CM

## 2017-03-13 DIAGNOSIS — Z79899 Other long term (current) drug therapy: Secondary | ICD-10-CM | POA: Diagnosis not present

## 2017-03-13 DIAGNOSIS — R4702 Dysphasia: Secondary | ICD-10-CM | POA: Diagnosis not present

## 2017-03-13 DIAGNOSIS — R4781 Slurred speech: Secondary | ICD-10-CM

## 2017-03-13 DIAGNOSIS — C61 Malignant neoplasm of prostate: Secondary | ICD-10-CM

## 2017-03-13 DIAGNOSIS — R29810 Facial weakness: Secondary | ICD-10-CM | POA: Diagnosis not present

## 2017-03-13 DIAGNOSIS — E871 Hypo-osmolality and hyponatremia: Secondary | ICD-10-CM | POA: Diagnosis present

## 2017-03-13 DIAGNOSIS — R531 Weakness: Secondary | ICD-10-CM | POA: Diagnosis not present

## 2017-03-13 DIAGNOSIS — E0865 Diabetes mellitus due to underlying condition with hyperglycemia: Secondary | ICD-10-CM | POA: Diagnosis not present

## 2017-03-13 DIAGNOSIS — Z8673 Personal history of transient ischemic attack (TIA), and cerebral infarction without residual deficits: Secondary | ICD-10-CM

## 2017-03-13 DIAGNOSIS — E785 Hyperlipidemia, unspecified: Secondary | ICD-10-CM | POA: Diagnosis present

## 2017-03-13 DIAGNOSIS — G40909 Epilepsy, unspecified, not intractable, without status epilepticus: Secondary | ICD-10-CM | POA: Diagnosis present

## 2017-03-13 DIAGNOSIS — G9340 Encephalopathy, unspecified: Secondary | ICD-10-CM | POA: Diagnosis not present

## 2017-03-13 DIAGNOSIS — R509 Fever, unspecified: Secondary | ICD-10-CM

## 2017-03-13 DIAGNOSIS — R5381 Other malaise: Secondary | ICD-10-CM | POA: Diagnosis not present

## 2017-03-13 DIAGNOSIS — N186 End stage renal disease: Secondary | ICD-10-CM

## 2017-03-13 DIAGNOSIS — R451 Restlessness and agitation: Secondary | ICD-10-CM | POA: Diagnosis not present

## 2017-03-13 DIAGNOSIS — Z87891 Personal history of nicotine dependence: Secondary | ICD-10-CM

## 2017-03-13 DIAGNOSIS — G8191 Hemiplegia, unspecified affecting right dominant side: Secondary | ICD-10-CM | POA: Diagnosis not present

## 2017-03-13 DIAGNOSIS — G9341 Metabolic encephalopathy: Secondary | ICD-10-CM | POA: Diagnosis not present

## 2017-03-13 DIAGNOSIS — D631 Anemia in chronic kidney disease: Secondary | ICD-10-CM | POA: Diagnosis present

## 2017-03-13 DIAGNOSIS — Z79818 Long term (current) use of other agents affecting estrogen receptors and estrogen levels: Secondary | ICD-10-CM | POA: Diagnosis not present

## 2017-03-13 DIAGNOSIS — IMO0001 Reserved for inherently not codable concepts without codable children: Secondary | ICD-10-CM

## 2017-03-13 DIAGNOSIS — I120 Hypertensive chronic kidney disease with stage 5 chronic kidney disease or end stage renal disease: Secondary | ICD-10-CM | POA: Diagnosis present

## 2017-03-13 DIAGNOSIS — R569 Unspecified convulsions: Secondary | ICD-10-CM | POA: Diagnosis not present

## 2017-03-13 DIAGNOSIS — E213 Hyperparathyroidism, unspecified: Secondary | ICD-10-CM | POA: Diagnosis present

## 2017-03-13 DIAGNOSIS — Z992 Dependence on renal dialysis: Secondary | ICD-10-CM | POA: Diagnosis not present

## 2017-03-13 DIAGNOSIS — R32 Unspecified urinary incontinence: Secondary | ICD-10-CM | POA: Diagnosis present

## 2017-03-13 DIAGNOSIS — R4701 Aphasia: Secondary | ICD-10-CM | POA: Clinically undetermined

## 2017-03-13 DIAGNOSIS — E11649 Type 2 diabetes mellitus with hypoglycemia without coma: Secondary | ICD-10-CM | POA: Diagnosis not present

## 2017-03-13 DIAGNOSIS — Z8546 Personal history of malignant neoplasm of prostate: Secondary | ICD-10-CM

## 2017-03-13 DIAGNOSIS — R188 Other ascites: Secondary | ICD-10-CM | POA: Diagnosis present

## 2017-03-13 DIAGNOSIS — N2581 Secondary hyperparathyroidism of renal origin: Secondary | ICD-10-CM | POA: Diagnosis present

## 2017-03-13 DIAGNOSIS — R739 Hyperglycemia, unspecified: Secondary | ICD-10-CM | POA: Diagnosis not present

## 2017-03-13 DIAGNOSIS — D509 Iron deficiency anemia, unspecified: Secondary | ICD-10-CM | POA: Diagnosis not present

## 2017-03-13 DIAGNOSIS — R05 Cough: Secondary | ICD-10-CM | POA: Diagnosis not present

## 2017-03-13 DIAGNOSIS — E1142 Type 2 diabetes mellitus with diabetic polyneuropathy: Secondary | ICD-10-CM | POA: Diagnosis not present

## 2017-03-13 DIAGNOSIS — Z880 Allergy status to penicillin: Secondary | ICD-10-CM | POA: Diagnosis not present

## 2017-03-13 DIAGNOSIS — N2589 Other disorders resulting from impaired renal tubular function: Secondary | ICD-10-CM | POA: Diagnosis not present

## 2017-03-13 DIAGNOSIS — E8770 Fluid overload, unspecified: Secondary | ICD-10-CM | POA: Diagnosis not present

## 2017-03-13 DIAGNOSIS — R4182 Altered mental status, unspecified: Secondary | ICD-10-CM | POA: Diagnosis not present

## 2017-03-13 DIAGNOSIS — I808 Phlebitis and thrombophlebitis of other sites: Secondary | ICD-10-CM | POA: Diagnosis not present

## 2017-03-13 DIAGNOSIS — D638 Anemia in other chronic diseases classified elsewhere: Secondary | ICD-10-CM | POA: Diagnosis not present

## 2017-03-13 DIAGNOSIS — G459 Transient cerebral ischemic attack, unspecified: Secondary | ICD-10-CM | POA: Diagnosis not present

## 2017-03-13 DIAGNOSIS — K769 Liver disease, unspecified: Secondary | ICD-10-CM | POA: Diagnosis not present

## 2017-03-13 DIAGNOSIS — R410 Disorientation, unspecified: Secondary | ICD-10-CM | POA: Diagnosis not present

## 2017-03-13 DIAGNOSIS — E1129 Type 2 diabetes mellitus with other diabetic kidney complication: Secondary | ICD-10-CM | POA: Diagnosis not present

## 2017-03-13 DIAGNOSIS — I6789 Other cerebrovascular disease: Secondary | ICD-10-CM | POA: Diagnosis not present

## 2017-03-13 DIAGNOSIS — R5081 Fever presenting with conditions classified elsewhere: Secondary | ICD-10-CM | POA: Diagnosis not present

## 2017-03-13 LAB — COMPREHENSIVE METABOLIC PANEL
ALT: 18 U/L (ref 17–63)
AST: 23 U/L (ref 15–41)
Albumin: 2.7 g/dL — ABNORMAL LOW (ref 3.5–5.0)
Alkaline Phosphatase: 49 U/L (ref 38–126)
Anion gap: 15 (ref 5–15)
BUN: 55 mg/dL — AB (ref 6–20)
CHLORIDE: 87 mmol/L — AB (ref 101–111)
CO2: 20 mmol/L — AB (ref 22–32)
Calcium: 8.1 mg/dL — ABNORMAL LOW (ref 8.9–10.3)
Creatinine, Ser: 9.88 mg/dL — ABNORMAL HIGH (ref 0.61–1.24)
GFR, EST AFRICAN AMERICAN: 6 mL/min — AB (ref >60)
GFR, EST NON AFRICAN AMERICAN: 5 mL/min — AB (ref >60)
Glucose, Bld: 1127 mg/dL (ref 65–99)
POTASSIUM: 5.6 mmol/L — AB (ref 3.5–5.1)
SODIUM: 122 mmol/L — AB (ref 135–145)
Total Bilirubin: 0.6 mg/dL (ref 0.3–1.2)
Total Protein: 5.8 g/dL — ABNORMAL LOW (ref 6.5–8.1)

## 2017-03-13 LAB — ETHANOL: Alcohol, Ethyl (B): <5 mg/dL (ref <5)

## 2017-03-13 LAB — CBC WITH DIFFERENTIAL/PLATELET
Basophils Absolute: 0 10*3/uL (ref 0.0–0.1)
Basophils Relative: 1 %
Eosinophils Absolute: 0.1 10*3/uL (ref 0.0–0.7)
Eosinophils Relative: 2 %
HCT: 25.9 % — ABNORMAL LOW (ref 39.0–52.0)
HEMOGLOBIN: 8.9 g/dL — AB (ref 13.0–17.0)
LYMPHS ABS: 0.8 10*3/uL (ref 0.7–4.0)
LYMPHS PCT: 30 %
MCH: 27.9 pg (ref 26.0–34.0)
MCHC: 34.4 g/dL (ref 30.0–36.0)
MCV: 81.2 fL (ref 78.0–100.0)
Monocytes Absolute: 0.2 10*3/uL (ref 0.1–1.0)
Monocytes Relative: 6 %
NEUTROS PCT: 61 %
Neutro Abs: 1.7 10*3/uL (ref 1.7–7.7)
Platelets: 158 10*3/uL (ref 150–400)
RBC: 3.19 MIL/uL — AB (ref 4.22–5.81)
RDW: 15.9 % — ABNORMAL HIGH (ref 11.5–15.5)
WBC: 2.7 10*3/uL — AB (ref 4.0–10.5)

## 2017-03-13 LAB — BODY FLUID CELL COUNT WITH DIFFERENTIAL: Total Nucleated Cell Count, Fluid: 3 cu mm (ref 0–1000)

## 2017-03-13 LAB — GRAM STAIN

## 2017-03-13 LAB — I-STAT CG4 LACTIC ACID, ED: LACTIC ACID, VENOUS: 1.35 mmol/L (ref 0.5–1.9)

## 2017-03-13 LAB — I-STAT TROPONIN, ED: Troponin i, poc: 0 ng/mL (ref 0.00–0.08)

## 2017-03-13 LAB — CBG MONITORING, ED
GLUCOSE-CAPILLARY: 581 mg/dL — AB (ref 65–99)
Glucose-Capillary: 493 mg/dL — ABNORMAL HIGH (ref 65–99)

## 2017-03-13 MED ORDER — ONDANSETRON HCL 4 MG PO TABS: 4.0000 mg | ORAL_TABLET | Freq: Four times a day (QID) | ORAL | Status: DC | PRN

## 2017-03-13 MED ORDER — LORAZEPAM 2 MG/ML IJ SOLN
1.0000 mg | Freq: Once | INTRAMUSCULAR | Status: AC
  Administered 2017-03-13: 1 mg via INTRAVENOUS
  Filled 2017-03-13: qty 1

## 2017-03-13 MED ORDER — INSULIN REGULAR BOLUS VIA INFUSION
10.0000 [IU] | Freq: Once | INTRAVENOUS | Status: AC
Start: 2017-03-13 — End: 2017-03-13
  Administered 2017-03-13: 10 [IU] via INTRAVENOUS
  Filled 2017-03-13: qty 10

## 2017-03-13 MED ORDER — SODIUM CHLORIDE 0.9 % IV SOLN
INTRAVENOUS | Status: DC
  Administered 2017-03-13: 19:00:00 via INTRAVENOUS

## 2017-03-13 MED ORDER — DEXTROSE-NACL 5-0.45 % IV SOLN: INTRAVENOUS | Status: DC

## 2017-03-13 MED ORDER — SODIUM CHLORIDE 0.9 % IV BOLUS (SEPSIS)
500.0000 mL | Freq: Once | INTRAVENOUS | Status: AC
  Administered 2017-03-13: 500 mL via INTRAVENOUS

## 2017-03-13 MED ORDER — DEXTROSE 50 % IV SOLN
25.0000 mL | INTRAVENOUS | Status: DC | PRN
  Administered 2017-03-15 – 2017-03-20 (×2): 25 mL via INTRAVENOUS
  Filled 2017-03-13 (×3): qty 50

## 2017-03-13 MED ORDER — SODIUM CHLORIDE 0.9% FLUSH
3.0000 mL | Freq: Two times a day (BID) | INTRAVENOUS | Status: DC
  Administered 2017-03-14 – 2017-03-21 (×10): 3 mL via INTRAVENOUS

## 2017-03-13 MED ORDER — NITROGLYCERIN 2 % TD OINT
1.0000 [in_us] | TOPICAL_OINTMENT | Freq: Four times a day (QID) | TRANSDERMAL | Status: DC
  Administered 2017-03-14 – 2017-03-15 (×5): 1 [in_us] via TOPICAL
  Filled 2017-03-13 (×28): qty 30
  Filled 2017-03-13: qty 1
  Filled 2017-03-13: qty 30

## 2017-03-13 MED ORDER — ACETAMINOPHEN 650 MG RE SUPP
650.0000 mg | Freq: Four times a day (QID) | RECTAL | Status: DC | PRN
  Administered 2017-03-14 (×4): 650 mg via RECTAL
  Filled 2017-03-13 (×4): qty 1

## 2017-03-13 MED ORDER — ONDANSETRON HCL 4 MG/2ML IJ SOLN
4.0000 mg | Freq: Four times a day (QID) | INTRAMUSCULAR | Status: DC | PRN
  Administered 2017-03-19: 4 mg via INTRAVENOUS
  Filled 2017-03-13: qty 2

## 2017-03-13 MED ORDER — HYDRALAZINE HCL 20 MG/ML IJ SOLN
20.0000 mg | Freq: Four times a day (QID) | INTRAMUSCULAR | Status: DC | PRN
Start: 2017-03-13 — End: 2017-03-18
  Administered 2017-03-14 – 2017-03-18 (×11): 20 mg via INTRAVENOUS
  Filled 2017-03-13 (×12): qty 1

## 2017-03-13 MED ORDER — INSULIN REGULAR BOLUS VIA INFUSION: 0.0000 [IU] | Freq: Three times a day (TID) | INTRAVENOUS | Status: DC

## 2017-03-13 MED ORDER — SODIUM CHLORIDE 0.9 % IV SOLN
INTRAVENOUS | Status: DC
  Administered 2017-03-13: 5.4 [IU]/h via INTRAVENOUS
  Filled 2017-03-13: qty 2.5

## 2017-03-13 MED ORDER — ACETAMINOPHEN 325 MG PO TABS: 650.0000 mg | ORAL_TABLET | Freq: Four times a day (QID) | ORAL | Status: DC | PRN

## 2017-03-13 NOTE — ED Provider Notes (Signed)
[facility] DEPT Provider Note   CSN: [id number] Arrival date & time: 03/13/17  1609     History   Chief Complaint No chief complaint on file.   HPI Jared [name] is a 61 y.o. male.  Patient is a 61 year old male with past medical history of diabetes on an insulin pump, end-stage renal disease on hemodialysis, hypertension, and seizures. He presents for evaluation of altered mental status. He was found by his family approximately 3 hours prior to presentation to be confused and disoriented. The patient has little additional history secondary to medical condition. His blood sugar was found to be greater than 600 by EMS and was transported here for evaluation. The last dialysis session is unknown.     The history is provided by the patient and the EMS personnel.    Past Medical History:  Diagnosis Date  . Anemia   . Chronic kidney disease    on dialysis M-W-F  . Diabetes mellitus without complication ([place])    onset as adult  . Hypertension   . Seizures ([place])   . Stroke ([place])   . Thyroid disease    hyperparathyroidism    Patient Active Problem List   Diagnosis Date Noted  . Elevated LDL cholesterol level 01/24/2017  . Diabetes mellitus due to underlying condition, uncontrolled, without complication, with long-term current use of insulin ([place]) 01/04/2017  . ESRD on dialysis ([place])   . Anemia of chronic disease   . Abdominal pain 12/08/2016  . Enlarged prostate 12/08/2016  . 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 Hypertension   . CKD (chronic kidney disease) stage V requiring chronic dialysis ([place])   . Acute respiratory failure with hypoxia ([place])   . Seizure ([place]) 11/24/2016  . Status epilepticus [facility])     Past Surgical History:  Procedure Laterality Date  . AV FISTULA PLACEMENT Left 06/04/2014   Procedure: ARTERIOVENOUS (AV) FISTULA CREATION-  BRACHIOCEPHALIC WITH LIGATION OF COMPETEING BRANCH;  Surgeon: Mal Misty, MD;  Location: [facility];  Service: Vascular;   Laterality: Left;  . INSERTION OF DIALYSIS CATHETER  04/2014  . IR GENERIC HISTORICAL Left 11/30/2016   IR THROMBECTOMY AV FISTULA W/THROMBOLYSIS/PTA/STENT INC/SHUNT/IMG LT 11/30/2016 Markus Daft, MD [facility]  . IR GENERIC HISTORICAL  11/30/2016   IR US GUIDE VASC ACCESS LEFT 11/30/2016 Markus Daft, MD [facility]       Home Medications    Prior to Admission medications   Medication Sig Start Date End Date Taking? Authorizing Provider  amLODipine (NORVASC) 10 MG tablet Take 1 tablet (10 mg total) by mouth daily. 12/08/16   Ivan Anchors Love, PA-C  atorvastatin (LIPITOR) 20 MG tablet Take 1 tablet (20 mg total) by mouth daily. 01/23/17   Vickie L Henson, NP-C  calcitRIOL (ROCALTROL) 0.5 MCG capsule Take 1 capsule (0.5 mcg total) by mouth daily. 12/08/16   Bary Leriche, PA-C  calcium carbonate (TUMS - DOSED IN MG ELEMENTAL CALCIUM) 500 MG chewable tablet Chew 2 tablets (400 mg of elemental calcium total) by mouth 3 (three) times daily with meals. 12/01/16   Velvet Bathe, MD  gentamicin cream (GARAMYCIN) 0.1 % Apply 1 application topically daily. To PD site 12/09/16   Ivan Anchors Love, PA-C  hydrALAZINE (APRESOLINE) 100 MG tablet Take 1 tablet (100 mg total) by mouth 3 (three) times daily. 12/08/16   Ivan Anchors Love, PA-C  insulin NPH Human (HUMULIN N,NOVOLIN N) 100 UNIT/ML injection Inject 0.15 mLs (15 Units total) into the skin 2 (two) times daily at 8 am  and 10 pm. 12/08/16   Ivan Anchors Love, PA-C  metoprolol (LOPRESSOR) 100 MG tablet Take 1 tablet (100 mg total) by mouth 2 (two) times daily. 12/08/16   Bary Leriche, PA-C  multivitamin (RENA-VIT) TABS tablet Take 1 tablet by mouth daily.    Historical Provider, MD  senna-docusate (SENOKOT-S) 8.6-50 MG tablet Take 2 tablets by mouth at bedtime. 12/08/16   Bary Leriche, PA-C    Family History Family History  Problem Relation Age of Onset  . Cancer - Lung Mother   . Cancer - Other Father     Social History Social History  Substance Use Topics  . Smoking  status: Former Smoker    Quit date: 05/11/1984  . Smokeless tobacco: Never Used  . Alcohol use 0.6 oz/week    1 Glasses of wine per week     Comment: occasional     Allergies   Penicillins   Review of Systems Review of Systems  All other systems reviewed and are negative.    Physical Exam Updated Vital Signs There were no vitals taken for this visit.  Physical Exam  Constitutional: He appears well-developed and well-nourished. No distress.  HENT:  Head: Normocephalic and atraumatic.  Mouth/Throat: Oropharynx is clear and moist.  Eyes: EOM are normal. Pupils are equal, round, and reactive to light.  Neck: Normal range of motion. Neck supple.  Cardiovascular: Normal rate and regular rhythm.  Exam reveals no friction rub.   No murmur heard. Pulmonary/Chest: Effort normal and breath sounds normal. No respiratory distress. He has no wheezes. He has no rales.  Abdominal: Soft. Bowel sounds are normal. He exhibits no distension. There is no tenderness.  Musculoskeletal: Normal range of motion. He exhibits no edema.  Neurological: He is alert.  Patient is awake and alert. Neurologic exam is difficult to perform secondary to patient's medical condition. He does move all 4 extremities and replies to questions. Sometimes his responses are appropriate, sometimes not.  Skin: Skin is warm and dry. He is not diaphoretic.  Nursing note and vitals reviewed.    ED Treatments / Results  Labs (all labs ordered are listed, but only abnormal results are displayed) Labs Reviewed  CBG MONITORING, ED - Abnormal; Notable for the following:       Result Value   Glucose-Capillary >600 (*)    All other components within normal limits  COMPREHENSIVE METABOLIC PANEL  CBC WITH DIFFERENTIAL/PLATELET  ETHANOL  I-STAT TROPOININ, ED  I-STAT CG4 LACTIC ACID, ED    EKG  EKG Interpretation None       Radiology No results found.  Procedures Procedures (including critical care  time)  Medications Ordered in ED Medications  sodium chloride 0.9 % bolus 500 mL (not administered)     Initial Impression / Assessment and Plan / ED Course  I have reviewed the triage vital signs and the nursing notes.  Pertinent labs & imaging results that were available during my care of the patient were reviewed by me and considered in my medical decision making (see chart for details).  Patient brought here for evaluation of confusion and slurred speech. His workup reveals a sugar of 1100 and glucose stabilizer has been initiated. Dr. Eulas Post to admit.   He has been confused which I believe is most likely related to the hyperglycemia. We did attempt a CT scan, however the patient would not hold still for this study. My plan is to admit the patient for stabilization of his blood sugar and observe  his mental status.  CRITICAL CARE Performed by: Veryl Speak Total critical care time: 30 minutes Critical care time was exclusive of separately billable procedures and treating other patients. Critical care was necessary to treat or prevent imminent or life-threatening deterioration. Critical care was time spent personally by me on the following activities: development of treatment plan with patient and/or surrogate as well as nursing, discussions with consultants, evaluation of patient's response to treatment, examination of patient, obtaining history from patient or surrogate, ordering and performing treatments and interventions, ordering and review of laboratory studies, ordering and review of radiographic studies, pulse oximetry and re-evaluation of patient's condition.   Final Clinical Impressions(s) / ED Diagnoses   Final diagnoses:  None    New Prescriptions New Prescriptions   No medications on file     Veryl Speak, MD 03/13/17 1194

## 2017-03-13 NOTE — ED Notes (Signed)
Patient transported to CT 

## 2017-03-13 NOTE — H&P (Signed)
History and Physical    Jared [name] KKX:[id number] DOB: 10-Aug-1956 DOA: 03/13/2017  PCP: Harland Dingwall, NP-C   Patient coming from: Home via EMS  Chief Complaint: Altered mental status  HPI: Jared [name] is a 61 y.o. gentleman with ESRD on peritoneal dialysis, HTN, IDDM, chronic cough, recent initiation of treatment for prostate cancer, and prior seizure in the setting of hyperglycemia who was brought to the ED tonight by EMS for evaluation of acute mental status changes.  The patient is altered and unable to communicate or give any meaningful history.  HPI taken from family members.  According to his sister, with whom he lives, he was last known to be in his baseline state of health this afternoon.  He accidentally locked himself out of the house and had to call her to get in.  He was not outside very long.  Shortly after that, she witnessed acute word finding difficulty and slowed speech.  He speech was comprehensible but inappropriate.  He was disoriented.  She noticed shaking in his left hand, but no other stigmata of seizure activity at home.  After about 20 minutes of no improvement, she called 911.  No falls, no head trauma, no syncope.  No complaints chest pain or shortness of breath.  No fever, nausea, vomiting, URI symptoms.  Of note, he has had problems with gait instability, prior to today.  He has complained of his legs "feeling heavy" and having a shuffling gait.  According to family, no reason to think that he has not been compliant with his home medications or his dialysis regimen.  At baseline, he is completely independent and still drives.  ED Course: Urinary incontinence noted in the ED.  The patient was too restless to comply with initial attempts for EKG and head CT.  He received a total of 2mg  of IV ativan.  Lactic acid level normal at 1.35.  Sodium 122, potassium 5.6, bicarb 20, BUN 55, Creatinine 9.88, glucose 1127.  WBC count 2.7, Hgb 8.9.  The patient received a  500cc NS bolus and insulin drip was ordered.  Hospitalist asked to admit.  Review of Systems: Unable to obtain due to mental status changes.   Past Medical History:  Diagnosis Date  . Anemia   . Chronic kidney disease    on dialysis M-W-F  . Diabetes mellitus without complication ([place])    onset as adult  . Hypertension   . Seizures ([place])   . Thyroid disease    hyperparathyroidism    Past Surgical History:  Procedure Laterality Date  . AV FISTULA PLACEMENT Left 06/04/2014   Procedure: ARTERIOVENOUS (AV) FISTULA CREATION-  BRACHIOCEPHALIC WITH LIGATION OF COMPETEING BRANCH;  Surgeon: Mal Misty, MD;  Location: [facility];  Service: Vascular;  Laterality: Left;  . INSERTION OF DIALYSIS CATHETER  04/2014  . IR GENERIC HISTORICAL Left 11/30/2016   IR THROMBECTOMY AV FISTULA W/THROMBOLYSIS/PTA/STENT INC/SHUNT/IMG LT 11/30/2016 Markus Daft, MD [facility]  . IR GENERIC HISTORICAL  11/30/2016   IR US GUIDE VASC ACCESS LEFT 11/30/2016 Markus Daft, MD [facility]    SOCIAL HISTORY: Family denies active tobacco, EtOH, or illicit drug use.  Allergies  Allergen Reactions  . Penicillins Other (See Comments)    Has patient had a PCN reaction causing immediate rash, facial/tongue/throat swelling, SOB or lightheadedness with hypotension: Unk Has patient had a PCN reaction causing severe rash involving mucus membranes or skin necrosis: Unk Has patient had a PCN reaction that required hospitalization: Unk Has patient had  a PCN reaction occurring within the last 10 years: Unk If all of the above answers are "NO", then may proceed with Cephalosporin use.     Family History  Problem Relation Age of Onset  . Cancer - Lung Mother   . Cancer - Other Father      Prior to Admission medications   Medication Sig Start Date End Date Taking? Authorizing Provider  amLODipine (NORVASC) 10 MG tablet Take 1 tablet (10 mg total) by mouth daily. 12/08/16  Yes Ivan Anchors Love, PA-C  atorvastatin (LIPITOR) 20 MG  tablet Take 1 tablet (20 mg total) by mouth daily. 01/23/17  Yes Vickie L Henson, NP-C  calcitRIOL (ROCALTROL) 0.5 MCG capsule Take 1 capsule (0.5 mcg total) by mouth daily. 12/08/16  Yes Ivan Anchors Love, PA-C  calcium carbonate (TUMS - DOSED IN MG ELEMENTAL CALCIUM) 500 MG chewable tablet Chew 2 tablets (400 mg of elemental calcium total) by mouth 3 (three) times daily with meals. 12/01/16  Yes Velvet Bathe, MD  gentamicin cream (GARAMYCIN) 0.1 % Apply 1 application topically daily. To PD site 12/09/16  Yes Ivan Anchors Love, PA-C  hydrALAZINE (APRESOLINE) 100 MG tablet Take 1 tablet (100 mg total) by mouth 3 (three) times daily. 12/08/16  Yes Ivan Anchors Love, PA-C  insulin NPH Human (HUMULIN N,NOVOLIN N) 100 UNIT/ML injection Inject 0.15 mLs (15 Units total) into the skin 2 (two) times daily at 8 am and 10 pm. 12/08/16  Yes Ivan Anchors Love, PA-C  metoprolol (LOPRESSOR) 100 MG tablet Take 1 tablet (100 mg total) by mouth 2 (two) times daily. 12/08/16  Yes Ivan Anchors Love, PA-C  multivitamin (RENA-VIT) TABS tablet Take 1 tablet by mouth daily.   Yes Historical Provider, MD  senna-docusate (SENOKOT-S) 8.6-50 MG tablet Take 2 tablets by mouth at bedtime. 12/08/16  Yes Ivan Anchors Love, PA-C  sevelamer carbonate (RENVELA) 800 MG tablet Take 800 mg by mouth 3 (three) times daily with meals.   Yes Historical Provider, MD  temazepam (RESTORIL) 15 MG capsule Take 15 mg by mouth at bedtime.   Yes Historical Provider, MD    Physical Exam: Vitals:   03/13/17 1831  BP: (!) 200/120  Temp: 98.7 F (37.1 C)  TempSrc: Rectal  SpO2: 97%      Constitutional: Restless but not combative and he is not acutely decompensating, no meaningful communication, not following commands Vitals:   03/13/17 1831  BP: (!) 200/120  Temp: 98.7 F (37.1 C)  TempSrc: Rectal  SpO2: 97%   Eyes: pupils equal but sluggish bilaterally, lids appear swollen bilaterally, conjunctiva normal ENMT: Unable to completely visualize due to mental status  changes and patient's inability to cooperate with exam  Neck: normal appearance, supple, no masses Respiratory: clear to auscultation bilaterally, no wheezing, no crackles. Normal respiratory effort. No accessory muscle use.  Cardiovascular: Normal rate, regular rhythm, no murmurs / rubs / gallops. No extremity edema. 2+ pedal pulses.  GI: abdomen is soft and compressible.  No distention.  No apparent tenderness.  No masses palpated.  Bowel sounds are hypoactive.  PD catheter to LLQ. Musculoskeletal:  No joint deformity in upper and lower extremities. Moves all four extremities spontaneously but not on command. No contractures. Normal muscle tone.  Skin: no rashes, warm and dry Neurologic: Unable to obtain due to altered mental status.  Globally encephalopathic.  He is not following commands. Psychiatric: Disoriented.  No insight into current condition.  Judgment impaired.    Labs on Admission: I have personally reviewed  following labs and imaging studies  CBC:  Recent Labs Lab 03/13/17 1643  WBC 2.7*  NEUTROABS 1.7  HGB 8.9*  HCT 25.9*  MCV 81.2  PLT 710   Basic Metabolic Panel:  Recent Labs Lab 03/13/17 1643  NA 122*  K 5.6*  CL 87*  CO2 20*  GLUCOSE 1,127*  BUN 55*  CREATININE 9.88*  CALCIUM 8.1*   GFR: CrCl cannot be calculated (Unknown ideal weight.). Liver Function Tests:  Recent Labs Lab 03/13/17 1643  AST 23  ALT 18  ALKPHOS 49  BILITOT 0.6  PROT 5.8*  ALBUMIN 2.7*   CBG:  Recent Labs Lab 03/13/17 1620  GLUCAP >600*   Urine analysis: Requested.  Sepsis Labs:  Lactic acid level 1.35  Radiological Exams on Admission: Ct Head Wo Contrast  Result Date: 03/13/2017 CLINICAL DATA:  Altered mental status with right-sided facial droop and confusion EXAM: CT HEAD WITHOUT CONTRAST TECHNIQUE: Contiguous axial images were obtained from the base of the skull through the vertex without intravenous contrast. COMPARISON:  MRI 11/27/2016, CT brain  11/24/2016 FINDINGS: Brain: No acute territorial infarction, hemorrhage, or focal mass lesion is visualized. There is mild to moderate atrophy. The ventricles are nonenlarged. Vascular: No hyperdense vessels. Scattered calcifications at the carotid siphons. Mild vertebral artery calcification Skull: No fracture. Stable ground-glass slightly expansile lesion in the anterior medial wall of the left maxillary sinus. Sinuses/Orbits: Mild mucosal thickening in the ethmoid sinuses and maxillary sinuses. No acute orbital abnormality. Other: None IMPRESSION: No definite CT evidence for acute intracranial abnormality. Electronically Signed   By: Donavan Foil M.D.   On: 03/13/2017 20:51   Dg Chest Port 1 View  Result Date: 03/13/2017 CLINICAL DATA:  Patient was found wandering outside. Last seen normal at 1400 hours today. Right-sided facial droop. Confusion and weakness. History of diabetes. EXAM: PORTABLE CHEST 1 VIEW COMPARISON:  11/26/2016 FINDINGS: Shallow inspiration. Normal heart size and pulmonary vascularity. No focal airspace disease or consolidation in the lungs. No blunting of costophrenic angles. No pneumothorax. Mediastinal contours appear intact. IMPRESSION: No active disease. Electronically Signed   By: Lucienne Capers M.D.   On: 03/13/2017 22:11    EKG: Requested.  Assessment/Plan Principal Problem:   Acute encephalopathy Active Problems:   CKD (chronic kidney disease) stage V requiring chronic dialysis ([place])   Diabetes mellitus due to underlying condition, uncontrolled, without complication, with long-term current use of insulin ([facility])   Prostate cancer ([facility])   Hyperglycemia   Accelerated hypertension      Acute encephalopathy secondary to severe hyperglycemia without DKA or coma.  Of note, the patient had a similar presentation in January. --IV insulin per protocol --One additional 500cc NS bolus ordered by me plus regular insulin 10mg  IV one time bolus due to severity of  hyperglycemia. --No maintenance fluids since he is a dialysis patient --Case discussed with nephrology on call (Dr. Joelyn Oms), who will consult and facilitate retrieval of peritoneal fluid sample to rule out SBP.  No empiric antibiotics for now. --The patient still makes urine; UA and UDS requested. --NPO until mental status improves --Fall, aspiration, seizure precautions --Neurology consult requested as well.  Head CT noted.  ESRD on PD --Management per nephrology  Accelerated HTN --Nitro paste to chest wall while NPO --IV hydralazine prn with paramters  Recent diagnosis of prostate cancer --Active treatment  History of diabetes, uncontrolled --On insulin drip for now    DVT prophylaxis: SCDs Code Status: FULL Family Communication: Son, two sisters present in the ED  at time of admission. Disposition Plan: To be determined. Consults called: Nephrology, Neurology Admission status: Inpatient, stepdown unit.  This patient is critically ill with multi-system organ dysfunction.  I expect he will need inpatient services for greater than two midnights.   TIME SPENT: 75 minutes   Eber Jones MD [facility] Pager [phone]  If 7PM-7AM, please contact night-coverage [email or web address] Password [email or web address]  03/13/2017, 8:25 PM

## 2017-03-13 NOTE — ED Triage Notes (Signed)
Pt arrived via ems from home after family found pt wandering outside of his locked home. Pt was last seen normal at 1400hrs today. Pt has hx of DM. EMS unable to obtain BP en route. EMS CBG "High". Right sided facial droop noted, which is a new finding according to pt's family. Pt is confused and not oriented to self, place, date, nor presidential questions.

## 2017-03-13 NOTE — ED Notes (Signed)
Admitting Provider at bedside. 

## 2017-03-13 NOTE — ED Notes (Signed)
Lab called RN with critical lab. CBG: 1,127.  EDP notified.

## 2017-03-13 NOTE — Consult Note (Signed)
Admission H&P    Chief Complaint: Acute encephalopathy with marked hyperglycemia.  HPI: Jared [name] is an 61 y.o. male with a history of diabetes mellitus and seizure activity associated with hypoglycemia, hypertension, hyperlipidemia, and end-stage renal disease on peritoneal dialysis, brought to the ED with increasing confusion as well as gait instability. Blood sugar was noted by EMS to be greater than 600. Blood sugar in the ED was noted to be 1127. Facial droop was noted after arriving in the ED. Family is not sure if patient had focal weakness involving extremities at any point. There is an equivocal history of stroke, denied by family. CT scan of his head without contrast was obtained, and results are pending.  Past Medical History:  Diagnosis Date  . Anemia   . Chronic kidney disease    on dialysis M-W-F  . Diabetes mellitus without complication ([place])    onset as adult  . Hypertension   . Seizures ([place])   . Stroke ([place])   . Thyroid disease    hyperparathyroidism    Past Surgical History:  Procedure Laterality Date  . AV FISTULA PLACEMENT Left 06/04/2014   Procedure: ARTERIOVENOUS (AV) FISTULA CREATION-  BRACHIOCEPHALIC WITH LIGATION OF COMPETEING BRANCH;  Surgeon: Mal Misty, MD;  Location: [facility];  Service: Vascular;  Laterality: Left;  . INSERTION OF DIALYSIS CATHETER  04/2014  . IR GENERIC HISTORICAL Left 11/30/2016   IR THROMBECTOMY AV FISTULA W/THROMBOLYSIS/PTA/STENT INC/SHUNT/IMG LT 11/30/2016 Markus Daft, MD [facility]  . IR GENERIC HISTORICAL  11/30/2016   IR US GUIDE VASC ACCESS LEFT 11/30/2016 Markus Daft, MD [facility]    Family History  Problem Relation Age of Onset  . Cancer - Lung Mother   . Cancer - Other Father    Social History:  reports that he quit smoking about 32 years ago. He has never used smokeless tobacco. He reports that he drinks about 0.6 oz of alcohol per week . He reports that he does not use drugs.  Allergies:  Allergies  Allergen Reactions   . Penicillins Other (See Comments)    Has patient had a PCN reaction causing immediate rash, facial/tongue/throat swelling, SOB or lightheadedness with hypotension: Unk Has patient had a PCN reaction causing severe rash involving mucus membranes or skin necrosis: Unk Has patient had a PCN reaction that required hospitalization: Unk Has patient had a PCN reaction occurring within the last 10 years: Unk If all of the above answers are "NO", then may proceed with Cephalosporin use.     Medications: Preadmission medications were reviewed by me.  ROS: Unavailable due to obtunded state.  Physical Examination: Blood pressure (!) 200/120, temperature 98.7 F (37.1 C), temperature source Rectal, SpO2 97 %.  HEENT-  Normocephalic, no lesions, without obvious abnormality.  Normal external eye and conjunctiva.  Normal TM's bilaterally.  Normal auditory canals and external ears. Normal external nose, mucus membranes and septum.  Normal pharynx. Neck supple with no masses, nodes, nodules or enlargement. Cardiovascular - regular rate and rhythm, S1, S2 normal, no murmur, click, rub or gallop Lungs - chest clear, no wheezing, rales, normal symmetric air entry Abdomen - soft, non-tender; bowel sounds normal; no masses,  no organomegaly Extremities - no joint deformities, effusion, or inflammation  Neurologic Examination: Patient was obtunded and moderately agitated. He had no response to verbal commands, and had no speech output. He had a strong preference for turning onto his right side. Pupils were equal and reacted normally to light. Extraocular movements were intact  with right left lateral gaze. Mild to moderate left lower facial droop was noted. Patient moved extremities equally. However, muscle tone was markedly increased in left extremities compared to right. Deep tendon reflexes were trace to 1+ and symmetrical. Plantar responses were mute bilaterally.  Results for orders placed or  performed during the hospital encounter of 03/13/17 (from the past 48 hour(s))  CBG monitoring, ED     Status: Abnormal   Collection Time: 03/13/17  4:20 PM  Result Value Ref Range   Glucose-Capillary >600 (HH) 65 - 99 mg/dL  Comprehensive metabolic panel     Status: Abnormal   Collection Time: 03/13/17  4:43 PM  Result Value Ref Range   Sodium 122 (L) 135 - 145 mmol/L   Potassium 5.6 (H) 3.5 - 5.1 mmol/L   Chloride 87 (L) 101 - 111 mmol/L   CO2 20 (L) 22 - 32 mmol/L   Glucose, Bld 1,127 (HH) 65 - 99 mg/dL    Comment: CRITICAL RESULT CALLED TO, READ BACK BY AND VERIFIED WITH: M PETERS,RN 1753 03/13/17 D BRADLEY QUESTIONABLE RESULTS, RECOMMEND RECOLLECT TO VERIFY    BUN 55 (H) 6 - 20 mg/dL   Creatinine, Ser 9.88 (H) 0.61 - 1.24 mg/dL   Calcium 8.1 (L) 8.9 - 10.3 mg/dL   Total Protein 5.8 (L) 6.5 - 8.1 g/dL   Albumin 2.7 (L) 3.5 - 5.0 g/dL   AST 23 15 - 41 U/L   ALT 18 17 - 63 U/L   Alkaline Phosphatase 49 38 - 126 U/L   Total Bilirubin 0.6 0.3 - 1.2 mg/dL   GFR calc non Af Amer 5 (L) >60 mL/min   GFR calc Af Amer 6 (L) >60 mL/min    Comment: (NOTE) The eGFR has been calculated using the CKD EPI equation. This calculation has not been validated in all clinical situations. eGFR's persistently <60 mL/min signify possible Chronic Kidney Disease.    Anion gap 15 5 - 15  CBC with Differential     Status: Abnormal   Collection Time: 03/13/17  4:43 PM  Result Value Ref Range   WBC 2.7 (L) 4.0 - 10.5 K/uL   RBC 3.19 (L) 4.22 - 5.81 MIL/uL   Hemoglobin 8.9 (L) 13.0 - 17.0 g/dL   HCT 25.9 (L) 39.0 - 52.0 %   MCV 81.2 78.0 - 100.0 fL   MCH 27.9 26.0 - 34.0 pg   MCHC 34.4 30.0 - 36.0 g/dL   RDW 15.9 (H) 11.5 - 15.5 %   Platelets 158 150 - 400 K/uL   Neutrophils Relative % 61 %   Neutro Abs 1.7 1.7 - 7.7 K/uL   Lymphocytes Relative 30 %   Lymphs Abs 0.8 0.7 - 4.0 K/uL   Monocytes Relative 6 %   Monocytes Absolute 0.2 0.1 - 1.0 K/uL   Eosinophils Relative 2 %   Eosinophils  Absolute 0.1 0.0 - 0.7 K/uL   Basophils Relative 1 %   Basophils Absolute 0.0 0.0 - 0.1 K/uL  Ethanol     Status: None   Collection Time: 03/13/17  4:43 PM  Result Value Ref Range   Alcohol, Ethyl (B) <5 <5 mg/dL    Comment:        LOWEST DETECTABLE LIMIT FOR SERUM ALCOHOL IS 5 mg/dL FOR MEDICAL PURPOSES ONLY   I-stat troponin, ED     Status: None   Collection Time: 03/13/17  4:47 PM  Result Value Ref Range   Troponin i, poc 0.00 0.00 - 0.08 ng/mL  Comment 3            Comment: Due to the release kinetics of cTnI, a negative result within the first hours of the onset of symptoms does not rule out myocardial infarction with certainty. If myocardial infarction is still suspected, repeat the test at appropriate intervals.   I-Stat CG4 Lactic Acid, ED     Status: None   Collection Time: 03/13/17  4:49 PM  Result Value Ref Range   Lactic Acid, Venous 1.35 0.5 - 1.9 mmol/L  CBG monitoring, ED     Status: Abnormal   Collection Time: 03/13/17  8:38 PM  Result Value Ref Range   Glucose-Capillary >600 (HH) 65 - 99 mg/dL   No results found.  Assessment/Plan 61 year old man with a history of insulin-dependent diabetes mellitus and previous admission for hypoglycemia with associated seizure activity, presenting with severe recurrent hyperglycemia with associated acute encephalopathy. No seizure activity has been reported. Significance of facial droop and increased muscle tone on the left is unclear. Acute stroke or TIA and not be ruled out, as well as possible recrudescence of deficits from previous stroke associated with hyperglycemia.  Recommendations: 1. MRI and MRA of the brain without contrast when feasible 2. Stroke/TIA workup, including carotid Doppler, echocardiogram and fasting lipid profile. 3. PT, OT and speech therapy consult when patient is stable  We will continue to follow this patient with you.  C.R. Nicole Kindred, MD [facility]  03/13/2017, 8:48 PM

## 2017-03-13 NOTE — ED Notes (Addendum)
Pt is very restless and not responding to instructions from RN/EMT. EDP notified

## 2017-03-13 NOTE — ED Notes (Signed)
Glucose stabilizer (GS) stopped due to pt being at CT when CBG was due. GS will be restarted and CBG checked when pt returns to ED.

## 2017-03-14 ENCOUNTER — Inpatient Hospital Stay (HOSPITAL_COMMUNITY): Payer: Medicare Other

## 2017-03-14 DIAGNOSIS — G459 Transient cerebral ischemic attack, unspecified: Secondary | ICD-10-CM

## 2017-03-14 DIAGNOSIS — G9340 Encephalopathy, unspecified: Secondary | ICD-10-CM

## 2017-03-14 DIAGNOSIS — C61 Malignant neoplasm of prostate: Secondary | ICD-10-CM

## 2017-03-14 LAB — LIPID PANEL
Cholesterol: 150 mg/dL (ref 0–200)
HDL: 64 mg/dL
LDL Cholesterol: 79 mg/dL (ref 0–99)
Total CHOL/HDL Ratio: 2.3 ratio
Triglycerides: 37 mg/dL
VLDL: 7 mg/dL (ref 0–40)

## 2017-03-14 LAB — GLUCOSE, CAPILLARY
GLUCOSE-CAPILLARY: 261 mg/dL — AB (ref 65–99)
GLUCOSE-CAPILLARY: 297 mg/dL — AB (ref 65–99)
GLUCOSE-CAPILLARY: 396 mg/dL — AB (ref 65–99)
GLUCOSE-CAPILLARY: 59 mg/dL — AB (ref 65–99)
GLUCOSE-CAPILLARY: 75 mg/dL (ref 65–99)
GLUCOSE-CAPILLARY: 75 mg/dL (ref 65–99)
GLUCOSE-CAPILLARY: 83 mg/dL (ref 65–99)

## 2017-03-14 LAB — BASIC METABOLIC PANEL
ANION GAP: 15 (ref 5–15)
BUN: 58 mg/dL — ABNORMAL HIGH (ref 6–20)
CHLORIDE: 97 mmol/L — AB (ref 101–111)
CO2: 23 mmol/L (ref 22–32)
Calcium: 8.9 mg/dL (ref 8.9–10.3)
Creatinine, Ser: 10.89 mg/dL — ABNORMAL HIGH (ref 0.61–1.24)
GFR, EST AFRICAN AMERICAN: 5 mL/min — AB (ref >60)
GFR, EST NON AFRICAN AMERICAN: 4 mL/min — AB (ref >60)
Glucose, Bld: 247 mg/dL — ABNORMAL HIGH (ref 65–99)
POTASSIUM: 4.6 mmol/L (ref 3.5–5.1)
SODIUM: 135 mmol/L (ref 135–145)

## 2017-03-14 LAB — URINALYSIS, ROUTINE W REFLEX MICROSCOPIC
Bilirubin Urine: NEGATIVE
Ketones, ur: 5 mg/dL — AB
Leukocytes, UA: NEGATIVE
NITRITE: NEGATIVE
PH: 8 (ref 5.0–8.0)
Protein, ur: >=300 mg/dL — AB
SPECIFIC GRAVITY, URINE: 1.01 (ref 1.005–1.030)

## 2017-03-14 LAB — HEMOGLOBIN A1C
HEMOGLOBIN A1C: 8.5 % — AB (ref 4.8–5.6)
MEAN PLASMA GLUCOSE: 197 mg/dL

## 2017-03-14 LAB — ECHOCARDIOGRAM COMPLETE
E decel time: 180 ms
E/e' ratio: 10.87
FS: 32 % (ref 28–44)
Height: 70 in
IVS/LV PW RATIO, ED: 0.96
LA ID, A-P, ES: 38 mm
LA diam end sys: 38 mm
LA diam index: 1.95 cm/m2
LA vol A4C: 46.2 mL
LA vol index: 25 mL/m2
LA vol: 48.8 mL
LV E/e' medial: 10.87
LV E/e'average: 10.87
LV PW d: 11.2 mm — AB (ref 0.6–1.1)
LV e' LATERAL: 9.57 cm/s
LVOT SV: 95 mL
LVOT VTI: 25 cm
LVOT area: 3.8 cm2
LVOT diameter: 22 mm
LVOT peak grad rest: 8 mmHg
LVOT peak vel: 141 cm/s
Lateral S' vel: 20.6 cm/s
MV Dec: 180
MV Peak grad: 4 mmHg
MV pk A vel: 127 m/s
MV pk E vel: 104 m/s
TAPSE: 27.8 mm
TDI e' lateral: 9.57
TDI e' medial: 7.62
Weight: 2720 [oz_av]

## 2017-03-14 LAB — CBC
HEMATOCRIT: 29 % — AB (ref 39.0–52.0)
Hemoglobin: 9.9 g/dL — ABNORMAL LOW (ref 13.0–17.0)
MCH: 27.2 pg (ref 26.0–34.0)
MCHC: 34.1 g/dL (ref 30.0–36.0)
MCV: 79.7 fL (ref 78.0–100.0)
Platelets: 202 10*3/uL (ref 150–400)
RBC: 3.64 MIL/uL — ABNORMAL LOW (ref 4.22–5.81)
RDW: 15.8 % — ABNORMAL HIGH (ref 11.5–15.5)
WBC: 7 10*3/uL (ref 4.0–10.5)

## 2017-03-14 LAB — RAPID URINE DRUG SCREEN, HOSP PERFORMED
AMPHETAMINES: NOT DETECTED
BARBITURATES: NOT DETECTED
Benzodiazepines: POSITIVE — AB
Cocaine: NOT DETECTED
OPIATES: NOT DETECTED
TETRAHYDROCANNABINOL: NOT DETECTED

## 2017-03-14 LAB — TROPONIN I
TROPONIN I: 0.04 ng/mL — AB (ref <0.03)
TROPONIN I: 0.06 ng/mL — AB (ref <0.03)
Troponin I: <0.03 ng/mL (ref <0.03)

## 2017-03-14 LAB — CBG MONITORING, ED
GLUCOSE-CAPILLARY: 196 mg/dL — AB (ref 65–99)
Glucose-Capillary: 223 mg/dL — ABNORMAL HIGH (ref 65–99)
Glucose-Capillary: 276 mg/dL — ABNORMAL HIGH (ref 65–99)
Glucose-Capillary: 363 mg/dL — ABNORMAL HIGH (ref 65–99)

## 2017-03-14 LAB — MRSA PCR SCREENING: MRSA BY PCR: NEGATIVE

## 2017-03-14 LAB — PATHOLOGIST SMEAR REVIEW

## 2017-03-14 MED ORDER — LORAZEPAM 2 MG/ML IJ SOLN
INTRAMUSCULAR | Status: AC
  Administered 2017-03-14: 2 mg via INTRAVENOUS
  Filled 2017-03-14: qty 1

## 2017-03-14 MED ORDER — LIDOCAINE HCL 1 % IJ SOLN
INTRAMUSCULAR | Status: AC
  Administered 2017-03-14: 15:00:00
  Filled 2017-03-14: qty 10

## 2017-03-14 MED ORDER — ENALAPRILAT 1.25 MG/ML IV SOLN
0.6250 mg | Freq: Four times a day (QID) | INTRAVENOUS | Status: DC
  Administered 2017-03-14 – 2017-03-15 (×3): 0.625 mg via INTRAVENOUS
  Filled 2017-03-14 (×3): qty 1

## 2017-03-14 MED ORDER — CEFTAZIDIME 1 G IJ SOLR
500.0000 mg | INTRAMUSCULAR | Status: DC
  Administered 2017-03-15 – 2017-03-18 (×4): 500 mg via INTRAVENOUS
  Filled 2017-03-14 (×4): qty 0.5

## 2017-03-14 MED ORDER — METOPROLOL TARTRATE 5 MG/5ML IV SOLN
10.0000 mg | Freq: Three times a day (TID) | INTRAVENOUS | Status: DC
  Administered 2017-03-14 – 2017-03-15 (×2): 10 mg via INTRAVENOUS
  Filled 2017-03-14 (×3): qty 10

## 2017-03-14 MED ORDER — HYDRALAZINE HCL 20 MG/ML IJ SOLN
10.0000 mg | Freq: Once | INTRAMUSCULAR | Status: AC
  Administered 2017-03-14: 10 mg via INTRAVENOUS
  Filled 2017-03-14: qty 1

## 2017-03-14 MED ORDER — LABETALOL HCL 5 MG/ML IV SOLN
10.0000 mg | INTRAVENOUS | Status: AC | PRN
  Administered 2017-03-14 (×3): 10 mg via INTRAVENOUS
  Filled 2017-03-14 (×2): qty 4

## 2017-03-14 MED ORDER — ORAL CARE MOUTH RINSE
15.0000 mL | Freq: Two times a day (BID) | OROMUCOSAL | Status: DC
  Administered 2017-03-14 – 2017-03-18 (×8): 15 mL via OROMUCOSAL

## 2017-03-14 MED ORDER — HALOPERIDOL LACTATE 5 MG/ML IJ SOLN
5.0000 mg | Freq: Once | INTRAMUSCULAR | Status: AC
  Administered 2017-03-14: 5 mg via INTRAVENOUS
  Filled 2017-03-14: qty 1

## 2017-03-14 MED ORDER — LABETALOL HCL 5 MG/ML IV SOLN
20.0000 mg | Freq: Once | INTRAVENOUS | Status: AC
  Administered 2017-03-14: 20 mg via INTRAVENOUS

## 2017-03-14 MED ORDER — DEXTROSE 5 % IV SOLN
2.0000 g | Freq: Two times a day (BID) | INTRAVENOUS | Status: DC
  Administered 2017-03-14: 2 g via INTRAVENOUS
  Filled 2017-03-14 (×2): qty 2

## 2017-03-14 MED ORDER — LIDOCAINE HCL (PF) 1 % IJ SOLN
INTRAMUSCULAR | Status: AC
  Administered 2017-03-14: 13:00:00
  Filled 2017-03-14: qty 5

## 2017-03-14 MED ORDER — HYDRALAZINE HCL 20 MG/ML IJ SOLN: 10.0000 mg | Freq: Once | INTRAMUSCULAR | Status: DC

## 2017-03-14 MED ORDER — LORAZEPAM 2 MG/ML IJ SOLN
1.0000 mg | Freq: Once | INTRAMUSCULAR | Status: AC
  Administered 2017-03-14: 1 mg via INTRAVENOUS
  Filled 2017-03-14: qty 1

## 2017-03-14 MED ORDER — DEXTROSE 5 % IV SOLN
1.0000 g | Freq: Once | INTRAVENOUS | Status: AC
  Administered 2017-03-14: 1 g via INTRAVENOUS
  Filled 2017-03-14: qty 1

## 2017-03-14 MED ORDER — INSULIN GLARGINE 100 UNIT/ML ~~LOC~~ SOLN
5.0000 [IU] | Freq: Once | SUBCUTANEOUS | Status: AC
  Administered 2017-03-14: 5 [IU] via SUBCUTANEOUS
  Filled 2017-03-14: qty 0.05

## 2017-03-14 MED ORDER — HEPARIN SODIUM (PORCINE) 5000 UNIT/ML IJ SOLN
5000.0000 [IU] | Freq: Three times a day (TID) | INTRAMUSCULAR | Status: DC
  Administered 2017-03-14 – 2017-03-21 (×19): 5000 [IU] via SUBCUTANEOUS
  Filled 2017-03-14 (×20): qty 1

## 2017-03-14 MED ORDER — LORAZEPAM 2 MG/ML IJ SOLN
2.0000 mg | Freq: Once | INTRAMUSCULAR | Status: AC
  Administered 2017-03-14: 2 mg via INTRAVENOUS

## 2017-03-14 MED ORDER — CHLORHEXIDINE GLUCONATE 0.12 % MT SOLN
15.0000 mL | Freq: Two times a day (BID) | OROMUCOSAL | Status: DC
  Administered 2017-03-14 – 2017-03-19 (×10): 15 mL via OROMUCOSAL
  Filled 2017-03-14 (×6): qty 15

## 2017-03-14 MED ORDER — DEXTROSE 50 % IV SOLN
25.0000 mL | Freq: Once | INTRAVENOUS | Status: AC
  Administered 2017-03-14: 25 mL via INTRAVENOUS

## 2017-03-14 MED ORDER — METOPROLOL TARTRATE 5 MG/5ML IV SOLN
5.0000 mg | Freq: Three times a day (TID) | INTRAVENOUS | Status: DC
  Administered 2017-03-14 (×2): 5 mg via INTRAVENOUS
  Filled 2017-03-14 (×2): qty 5

## 2017-03-14 MED ORDER — LORAZEPAM 2 MG/ML IJ SOLN
INTRAMUSCULAR | Status: AC
  Administered 2017-03-14: 2 mg
  Filled 2017-03-14: qty 1

## 2017-03-14 MED ORDER — VANCOMYCIN HCL 10 G IV SOLR
2000.0000 mg | Freq: Once | INTRAVENOUS | Status: AC
  Administered 2017-03-14: 2000 mg via INTRAVENOUS
  Filled 2017-03-14: qty 2000

## 2017-03-14 MED ORDER — INSULIN GLARGINE 100 UNIT/ML ~~LOC~~ SOLN
5.0000 [IU] | Freq: Every day | SUBCUTANEOUS | Status: DC
  Administered 2017-03-14: 5 [IU] via SUBCUTANEOUS
  Filled 2017-03-14: qty 0.05

## 2017-03-14 MED ORDER — INSULIN GLARGINE 100 UNIT/ML ~~LOC~~ SOLN
10.0000 [IU] | Freq: Two times a day (BID) | SUBCUTANEOUS | Status: DC
  Administered 2017-03-14 – 2017-03-15 (×2): 10 [IU] via SUBCUTANEOUS
  Filled 2017-03-14 (×2): qty 0.1

## 2017-03-14 MED ORDER — INSULIN ASPART 100 UNIT/ML ~~LOC~~ SOLN
0.0000 [IU] | Freq: Three times a day (TID) | SUBCUTANEOUS | Status: DC
  Administered 2017-03-14: 5 [IU] via SUBCUTANEOUS
  Administered 2017-03-14: 9 [IU] via SUBCUTANEOUS
  Administered 2017-03-15: 1 [IU] via SUBCUTANEOUS
  Administered 2017-03-16: 2 [IU] via SUBCUTANEOUS
  Administered 2017-03-16: 7 [IU] via SUBCUTANEOUS
  Administered 2017-03-17: 3 [IU] via SUBCUTANEOUS
  Administered 2017-03-18 – 2017-03-19 (×2): 2 [IU] via SUBCUTANEOUS
  Administered 2017-03-20: 1 [IU] via SUBCUTANEOUS
  Administered 2017-03-20: 5 [IU] via SUBCUTANEOUS
  Administered 2017-03-21: 7 [IU] via SUBCUTANEOUS
  Administered 2017-03-21: 3 [IU] via SUBCUTANEOUS

## 2017-03-14 NOTE — Progress Notes (Signed)
Discussed abx administration w/ Dr. Shon Hale - stated to give abx now instead of waiting until after LP.

## 2017-03-14 NOTE — Progress Notes (Signed)
Paged Dr. Grandville Silos, hospitalist. Discussed SBP >200, temp 103F, cbg 247 w/o insulin coverage, still not following commands and too combative for MRI. See associated orders.

## 2017-03-14 NOTE — Progress Notes (Signed)
addendum == Noted Jared [name] does  have a Left upper arm AVF with positive bruit and thrill on exam / Nontender

## 2017-03-14 NOTE — Progress Notes (Signed)
Inpatient Diabetes Program Recommendations  AACE/ADA: New Consensus Statement on Inpatient Glycemic Control (2015)  Target Ranges:  Prepandial:   less than 140 mg/dL      Peak postprandial:   less than 180 mg/dL (1-2 hours)      Critically ill patients:  140 - 180 mg/dL   Results for Jared [name], Jared [name] (MRN [id number]) as of 03/14/2017 10:33  Ref. Range 03/13/2017 16:20 03/13/2017 20:38 03/13/2017 22:05 03/13/2017 23:13 03/14/2017 00:17 03/14/2017 01:18 03/14/2017 02:20 03/14/2017 03:22 03/14/2017 06:11 03/14/2017 09:03  Glucose-Capillary Latest Ref Range: 65 - 99 mg/dL >600 (HH) >600 (HH) 581 (HH) 493 (H) 363 (H) 276 (H) 223 (H) 196 (H) 297 (H) 396 (H)   Review of Glycemic Control  Diabetes history: DM2 Outpatient Diabetes medications: NPH 15 units BID Current orders for Inpatient glycemic control: Lantus 5 units daily, Novolog 0-9 units TID with meals  Inpatient Diabetes Program Recommendations: Insulin - Basal: Please consider increasing Lantus to 10 units BID (based on 77 kg x 0.25 units). Correction (SSI): Please consider increasing Novolog correction to Moderate scale and changing frequency to Q4H since patient continues to have AMS and not following commands.  NOTE: Patient was recently inpatient and then transitioned to CIR. During hospitalization, patient was ordered NPH 10 units BID, Novlog 0-20 units TID with meals, and Novolog 0-5 units QHS and discharged from CIR on 12/09/16 on NPH 15 BID and Novolog 10 units with breakfast and supper if patient eats at least 50%. Initial glucose 1127 mg/dl on presentation to hospital this admission and IV insulin was ordered and patient transitioned to SQ insulin. Per chart review, insulin drip was stopped at 3:22 am and patient did not receive Lantus 5 units until 4:36 am today.   Thanks, Barnie Alderman, RN, MSN, CDE Diabetes Coordinator Inpatient Diabetes Program [phone] (Team Pager from 8am to 5pm)

## 2017-03-14 NOTE — Progress Notes (Signed)
Subjective: Patient is withdraws from pain and follows no commands. He is tachy cardiac wit BP 190's/122 and higher. Rigors with temp 103, writhing in bed.   Exam: Vitals:   03/14/17 1156 03/14/17 1215  BP: (!) 200/89 (!) 192/122  Pulse:  (!) 113  Resp:    Temp:  (!) 102.4 F (39.1 C)      Neuro:  CN: Pupils are equal and round. They are symmetrically reactive from 3-->2 mm. EOMI without nystagmus. Facial sensation is intact to light touch. Face is symmetric at rest Bilateral SCM and trapezii are 5/5.  Motor:  Rigors with 5/5 strength throughout, writing in bed Sensation: withdraws to pain  DTRs: 2+, symmetric  Toes downgoing bilaterally.   Pertinent Labs/Diagnostics: UA cloudy, glucose >500, protein > 300 Benzo positive UC pending BC pending   Etta Quill PA-C [facility] [phone]  Impression: febrile patient who came in with AMS and fever FUO. LP attempted but could not obtain after 4 mg Ativan.  Will need LP to be done under [facility]. Although family denies any substance abuse still question withdrawal versus toxic metabolic encephalopathy versus infection.  Will initiate Ceftriaxone 2 g every 12 hours (Patient does have allergie to PCN but not clear. At this time benefits out weigh risk) and Vancomycin has been ordered.          03/14/2017, 1:07 PM

## 2017-03-14 NOTE — Consult Note (Signed)
[facility] Renal Consultation Note  Indication for Consultation:  Management of ESRD/hemodialysis; anemia, hypertension/volume and secondary hyperparathyroidism  HPI: Jared [name] is a 61 y.o. male with ESRD  Sec DM on Peritoneal Dialysis (CCPD at [facility] Seizure in setting of Hyperglycemia , Prostatic Cancer  ( Dr. Tammi Klippel follows with [facility])  Admitted with  Increasing confusion yesterday and elevated BS  1127 in ER and temp to 103 . History per sisters in room yesterday ="in usual state of health yesterday,[place]  In am ,came home around 200 pm  Had Trouble talking and increased in severity  Called ems." . AS op with ESRD per PD RN chemistries clearing appropriately with CCPD but with home visit 03/07/17 BS up to 300-400 range / some trouble seeing his PD machine numbers/Pt. Stated he is seeing PCP/ Endo to control his DM.  Pt ,uncommunicative currently ,febrile  103 R on cooling blanket, bp 185/90 /  O2 sat 99% Rm Air ,CXR= no pulm edema or PNA , K 4.6, bun 58, co2 =23  PD cellct= 3 and colorless, hgb 9.9 ,wbc 7.0  Neurology and CCM  eval in progress . MRI pending with ct Hd No definite CT evidence for acute intracranial abnormality.      Noted prior admit 11/24/16 - 12/09/16  With rehab stay = Admit AMS in setting of Hyperglycemia , HTN urgency, needed to return ot HD then back on CCPD at dc  From [facility] rehab .  Noted Dr. Tammi Klippel ordered Lupron Injection  started 02/13/17 , Ordered  Bone Scan 01/22/17 "" showing left maxillary region 1.5 cm ground-glass lesion anteromedial aspect left fibrous dysplasia or brown tumor (given patient's history ofdialysis), osseous metastatic disease is also a possibility.""     Past Medical History:  Diagnosis Date  . Anemia   . Chronic kidney disease    on dialysis M-W-F  . Diabetes mellitus without complication ([place])    onset as adult  . Hypertension   . Seizures ([place])   . Stroke ([place])   . Thyroid  disease    hyperparathyroidism    Past Surgical History:  Procedure Laterality Date  . AV FISTULA PLACEMENT Left 06/04/2014   Procedure: ARTERIOVENOUS (AV) FISTULA CREATION-  BRACHIOCEPHALIC WITH LIGATION OF COMPETEING BRANCH;  Surgeon: Mal Misty, MD;  Location: [facility];  Service: Vascular;  Laterality: Left;  . INSERTION OF DIALYSIS CATHETER  04/2014  . IR GENERIC HISTORICAL Left 11/30/2016   IR THROMBECTOMY AV FISTULA W/THROMBOLYSIS/PTA/STENT INC/SHUNT/IMG LT 11/30/2016 Markus Daft, MD [facility]  . IR GENERIC HISTORICAL  11/30/2016   IR US GUIDE VASC ACCESS LEFT 11/30/2016 Markus Daft, MD [facility]      Family History  Problem Relation Age of Onset  . Cancer - Lung Mother   . Cancer - Other Father       reports that he quit smoking about 32 years ago. He has never used smokeless tobacco. He reports that he drinks about 0.6 oz of alcohol per week . He reports that he does not use drugs.   Allergies  Allergen Reactions  . Penicillins Other (See Comments)    Has patient had a PCN reaction causing immediate rash, facial/tongue/throat swelling, SOB or lightheadedness with hypotension: Unk Has patient had a PCN reaction causing severe rash involving mucus membranes or skin necrosis: Unk Has patient had a PCN reaction that required hospitalization: Unk Has patient had a PCN reaction occurring within the last 10 years: Unk If all of  the above answers are "NO", then may proceed with Cephalosporin use.     Prior to Admission medications   Medication Sig Start Date End Date Taking? Authorizing Provider  amLODipine (NORVASC) 10 MG tablet Take 1 tablet (10 mg total) by mouth daily. 12/08/16  Yes Ivan Anchors Love, PA-C  atorvastatin (LIPITOR) 20 MG tablet Take 1 tablet (20 mg total) by mouth daily. 01/23/17  Yes Vickie L Henson, NP-C  calcitRIOL (ROCALTROL) 0.5 MCG capsule Take 1 capsule (0.5 mcg total) by mouth daily. 12/08/16  Yes Ivan Anchors Love, PA-C  calcium carbonate (TUMS - DOSED IN MG  ELEMENTAL CALCIUM) 500 MG chewable tablet Chew 2 tablets (400 mg of elemental calcium total) by mouth 3 (three) times daily with meals. 12/01/16  Yes Velvet Bathe, MD  gentamicin cream (GARAMYCIN) 0.1 % Apply 1 application topically daily. To PD site 12/09/16  Yes Ivan Anchors Love, PA-C  hydrALAZINE (APRESOLINE) 100 MG tablet Take 1 tablet (100 mg total) by mouth 3 (three) times daily. 12/08/16  Yes Ivan Anchors Love, PA-C  insulin NPH Human (HUMULIN N,NOVOLIN N) 100 UNIT/ML injection Inject 0.15 mLs (15 Units total) into the skin 2 (two) times daily at 8 am and 10 pm. 12/08/16  Yes Ivan Anchors Love, PA-C  metoprolol (LOPRESSOR) 100 MG tablet Take 1 tablet (100 mg total) by mouth 2 (two) times daily. 12/08/16  Yes Ivan Anchors Love, PA-C  multivitamin (RENA-VIT) TABS tablet Take 1 tablet by mouth daily.   Yes Historical Provider, MD  senna-docusate (SENOKOT-S) 8.6-50 MG tablet Take 2 tablets by mouth at bedtime. 12/08/16  Yes Ivan Anchors Love, PA-C  sevelamer carbonate (RENVELA) 800 MG tablet Take 800 mg by mouth 3 (three) times daily with meals.   Yes Historical Provider, MD  temazepam (RESTORIL) 15 MG capsule Take 15 mg by mouth at bedtime.   Yes Historical Provider, MD     Anti-infectives    None      Results for orders placed or performed during the hospital encounter of 03/13/17 (from the past 48 hour(s))  CBG monitoring, ED     Status: Abnormal   Collection Time: 03/13/17  4:20 PM  Result Value Ref Range   Glucose-Capillary >600 (HH) 65 - 99 mg/dL  Comprehensive metabolic panel     Status: Abnormal   Collection Time: 03/13/17  4:43 PM  Result Value Ref Range   Sodium 122 (L) 135 - 145 mmol/L   Potassium 5.6 (H) 3.5 - 5.1 mmol/L   Chloride 87 (L) 101 - 111 mmol/L   CO2 20 (L) 22 - 32 mmol/L   Glucose, Bld 1,127 (HH) 65 - 99 mg/dL    Comment: CRITICAL RESULT CALLED TO, READ BACK BY AND VERIFIED WITH: M PETERS,RN 1753 03/13/17 D BRADLEY QUESTIONABLE RESULTS, RECOMMEND RECOLLECT TO VERIFY    BUN 55 (H) 6 -  20 mg/dL   Creatinine, Ser 9.88 (H) 0.61 - 1.24 mg/dL   Calcium 8.1 (L) 8.9 - 10.3 mg/dL   Total Protein 5.8 (L) 6.5 - 8.1 g/dL   Albumin 2.7 (L) 3.5 - 5.0 g/dL   AST 23 15 - 41 U/L   ALT 18 17 - 63 U/L   Alkaline Phosphatase 49 38 - 126 U/L   Total Bilirubin 0.6 0.3 - 1.2 mg/dL   GFR calc non Af Amer 5 (L) >60 mL/min   GFR calc Af Amer 6 (L) >60 mL/min    Comment: (NOTE) The eGFR has been calculated using the CKD EPI equation. This calculation  has not been validated in all clinical situations. eGFR's persistently <60 mL/min signify possible Chronic Kidney Disease.    Anion gap 15 5 - 15  CBC with Differential     Status: Abnormal   Collection Time: 03/13/17  4:43 PM  Result Value Ref Range   WBC 2.7 (L) 4.0 - 10.5 K/uL   RBC 3.19 (L) 4.22 - 5.81 MIL/uL   Hemoglobin 8.9 (L) 13.0 - 17.0 g/dL   HCT 25.9 (L) 39.0 - 52.0 %   MCV 81.2 78.0 - 100.0 fL   MCH 27.9 26.0 - 34.0 pg   MCHC 34.4 30.0 - 36.0 g/dL   RDW 15.9 (H) 11.5 - 15.5 %   Platelets 158 150 - 400 K/uL   Neutrophils Relative % 61 %   Neutro Abs 1.7 1.7 - 7.7 K/uL   Lymphocytes Relative 30 %   Lymphs Abs 0.8 0.7 - 4.0 K/uL   Monocytes Relative 6 %   Monocytes Absolute 0.2 0.1 - 1.0 K/uL   Eosinophils Relative 2 %   Eosinophils Absolute 0.1 0.0 - 0.7 K/uL   Basophils Relative 1 %   Basophils Absolute 0.0 0.0 - 0.1 K/uL  Ethanol     Status: None   Collection Time: 03/13/17  4:43 PM  Result Value Ref Range   Alcohol, Ethyl (B) <5 <5 mg/dL    Comment:        LOWEST DETECTABLE LIMIT FOR SERUM ALCOHOL IS 5 mg/dL FOR MEDICAL PURPOSES ONLY   I-stat troponin, ED     Status: None   Collection Time: 03/13/17  4:47 PM  Result Value Ref Range   Troponin i, poc 0.00 0.00 - 0.08 ng/mL   Comment 3            Comment: Due to the release kinetics of cTnI, a negative result within the first hours of the onset of symptoms does not rule out myocardial infarction with certainty. If myocardial infarction is still  suspected, repeat the test at appropriate intervals.   I-Stat CG4 Lactic Acid, ED     Status: None   Collection Time: 03/13/17  4:49 PM  Result Value Ref Range   Lactic Acid, Venous 1.35 0.5 - 1.9 mmol/L  Body fluid cell count with differential     Status: Abnormal   Collection Time: 03/13/17  8:15 PM  Result Value Ref Range   Fluid Type-FCT Peritoneal    Color, Fluid COLORLESS (A) YELLOW   Appearance, Fluid CLEAR CLEAR   WBC, Fluid 3 0 - 1,000 cu mm   Other Cells, Fluid TOO FEW TO COUNT, SMEAR AVAILABLE FOR REVIEW %    Comment: FEW LYMPHOCYTES  Gram stain     Status: None   Collection Time: 03/13/17  8:15 PM  Result Value Ref Range   Specimen Description PERITONEAL    Special Requests NONE    Gram Stain      DIRECT SMEAR NO WBC SEEN NO ORGANISMS SEEN RESULT CALLED TO, READ BACK BY AND VERIFIED WITH: M.PETERS,RN 2200 03/13/17 M.CAMPBELL    Report Status 03/13/2017 FINAL   Culture, body fluid-bottle     Status: None (Preliminary result)   Collection Time: 03/13/17  8:15 PM  Result Value Ref Range   Specimen Description FLUID PERITONEAL    Special Requests      BOTTLES DRAWN AEROBIC AND ANAEROBIC Blood Culture adequate volume   Culture PENDING    Report Status PENDING   CBG monitoring, ED     Status: Abnormal  Collection Time: 03/13/17  8:38 PM  Result Value Ref Range   Glucose-Capillary >600 (HH) 65 - 99 mg/dL  CBG monitoring, ED     Status: Abnormal   Collection Time: 03/13/17 10:05 PM  Result Value Ref Range   Glucose-Capillary 581 (HH) 65 - 99 mg/dL  CBG monitoring, ED     Status: Abnormal   Collection Time: 03/13/17 11:13 PM  Result Value Ref Range   Glucose-Capillary 493 (H) 65 - 99 mg/dL  Troponin I     Status: None   Collection Time: 03/13/17 11:49 PM  Result Value Ref Range   Troponin I <0.03 <0.03 ng/mL  CBG monitoring, ED     Status: Abnormal   Collection Time: 03/14/17 12:17 AM  Result Value Ref Range   Glucose-Capillary 363 (H) 65 - 99 mg/dL  CBG  monitoring, ED     Status: Abnormal   Collection Time: 03/14/17  1:18 AM  Result Value Ref Range   Glucose-Capillary 276 (H) 65 - 99 mg/dL  CBG monitoring, ED     Status: Abnormal   Collection Time: 03/14/17  2:20 AM  Result Value Ref Range   Glucose-Capillary 223 (H) 65 - 99 mg/dL  CBC     Status: Abnormal   Collection Time: 03/14/17  2:37 AM  Result Value Ref Range   WBC 7.0 4.0 - 10.5 K/uL   RBC 3.64 (L) 4.22 - 5.81 MIL/uL   Hemoglobin 9.9 (L) 13.0 - 17.0 g/dL   HCT 29.0 (L) 39.0 - 52.0 %   MCV 79.7 78.0 - 100.0 fL   MCH 27.2 26.0 - 34.0 pg   MCHC 34.1 30.0 - 36.0 g/dL   RDW 15.8 (H) 11.5 - 15.5 %   Platelets 202 150 - 400 K/uL  Troponin I     Status: Abnormal   Collection Time: 03/14/17  2:37 AM  Result Value Ref Range   Troponin I 0.04 (HH) <0.03 ng/mL    Comment: CRITICAL RESULT CALLED TO, READ BACK BY AND VERIFIED WITH: [facility] K,RN 03/14/17 0340 WAYK   Lipid panel     Status: None   Collection Time: 03/14/17  2:37 AM  Result Value Ref Range   Cholesterol 150 0 - 200 mg/dL   Triglycerides 37 <150 mg/dL   HDL 64 >40 mg/dL   Total CHOL/HDL Ratio 2.3 RATIO   VLDL 7 0 - 40 mg/dL   LDL Cholesterol 79 0 - 99 mg/dL    Comment:        Total Cholesterol/HDL:CHD Risk Coronary Heart Disease Risk Table                     Men   Women  1/2 Average Risk   3.4   3.3  Average Risk       5.0   4.4  2 X Average Risk   9.6   7.1  3 X Average Risk  23.4   11.0        Use the calculated Patient Ratio above and the CHD Risk Table to determine the patient's CHD Risk.        ATP III CLASSIFICATION (LDL):  <100     mg/dL   Optimal  100-129  mg/dL   Near or Above                    Optimal  130-159  mg/dL   Borderline  160-189  mg/dL   High  >190  mg/dL   Very High   Basic metabolic panel     Status: Abnormal   Collection Time: 03/14/17  2:37 AM  Result Value Ref Range   Sodium 135 135 - 145 mmol/L    Comment: DELTA CHECK NOTED   Potassium 4.6 3.5 - 5.1 mmol/L    Comment:  DELTA CHECK NOTED   Chloride 97 (L) 101 - 111 mmol/L   CO2 23 22 - 32 mmol/L   Glucose, Bld 247 (H) 65 - 99 mg/dL   BUN 58 (H) 6 - 20 mg/dL   Creatinine, Ser 10.89 (H) 0.61 - 1.24 mg/dL   Calcium 8.9 8.9 - 10.3 mg/dL   GFR calc non Af Amer 4 (L) >60 mL/min   GFR calc Af Amer 5 (L) >60 mL/min    Comment: (NOTE) The eGFR has been calculated using the CKD EPI equation. This calculation has not been validated in all clinical situations. eGFR's persistently <60 mL/min signify possible Chronic Kidney Disease.    Anion gap 15 5 - 15  CBG monitoring, ED     Status: Abnormal   Collection Time: 03/14/17  3:22 AM  Result Value Ref Range   Glucose-Capillary 196 (H) 65 - 99 mg/dL  MRSA PCR Screening     Status: None   Collection Time: 03/14/17  5:18 AM  Result Value Ref Range   MRSA by PCR NEGATIVE NEGATIVE    Comment:        The GeneXpert MRSA Assay (FDA approved for NASAL specimens only), is one component of a comprehensive MRSA colonization surveillance program. It is not intended to diagnose MRSA infection nor to guide or monitor treatment for MRSA infections.   Glucose, capillary     Status: Abnormal   Collection Time: 03/14/17  6:11 AM  Result Value Ref Range   Glucose-Capillary 297 (H) 65 - 99 mg/dL  Glucose, capillary     Status: Abnormal   Collection Time: 03/14/17  9:03 AM  Result Value Ref Range   Glucose-Capillary 396 (H) 65 - 99 mg/dL   .  ROS: not available sec pt. AMS / family ion Room state no recent illness they are aware of.  Physical Exam: Vitals:   03/14/17 0742 03/14/17 0900  BP:  (!) 189/78  Pulse:    Resp:  (!) 25  Temp: (!) 103 F (39.4 C)      General: AAM obtunded / no resp[onse to verbal requests/ slightly aggittated  But not in severe distress / hand mitts in place  HEENT: [place] , MMM Neck: no jvd, no carotid bruits, supple  Heart: RRR no rub, mur,or gallop  Lungs: CTA , non labored breathing  Abdomen: BS pops, soft , NT, ND, PD cath in L  lower quad Extremities:  nop pedal edema ,cyanosis Skin: no overt rash , no pedal ulcers ,warm Neuro: Obtunded,not following commands, moving all extrem  independently / L extrem mus tone <R Dialysis Access: PD Cath site Non tender ,no dc , dressing dry /clean   Dialysis Orders:  ON CCPD  7x /week   EDW 75.5kg  CCPD=  6 exchanges  2500 fill vol. Dwell time 1 hr 22mn                               Mircera 510m  03/21/17  (Mircera 200 given 02/21/17) And Venofer '250mg'$   For 03/21/17  Assessment/Plan 1. Acute Encephalopathy  With Hyperglycemia (BS>1000), Febrile illness /  CCM  Neurology eval in process  2. ESRD -  On CCPD +today no need for Dialysis with lab and vol ok, assess tomor  3. Hypertension/volume  - HTn  RX with iv meds / on home meds Amlodipine '10mg'$  / Hydralazine tid, Metoprolol '100mg'$  bid  4. Anemia  -hgb 9.9 / fu trend  HGB / ESA histroy for Mircera 50 on 03/21/17 per op hx.and  venofer 250 on 03/21/17 5. Metabolic bone disease -  On Po calcitriol / tums / Renvela as binders when back on pos  6. HO Prostate CA - on  Lupron  7. DM uncontrolled - per primary team  Ernest Haber, PA-C [place] [phone] 03/14/2017, 9:15 AM   Pt seen, examined and agree w A/P as above. ESRD pt with high fevers and AMS.  On empiric IV abx with fortaz/ vanc.  LP attempted but pt couldn't cooperate.  Plan hold off on PD today , will reassess tomorrow.   Kelly Splinter MD [employer] pager [phone]   03/14/2017, 5:21 PM

## 2017-03-14 NOTE — Progress Notes (Signed)
Paged both Neurology and TRH several times due to patients elevated BP (up to 215/87), increased agitation, vomiting, and potential for inability to protect airway. Patient with left sided gaze, facial droop, and RUE weakness. Patient does respond to pain. Pupils 3, equal and reactive. Temperature of 103 axillary. Labetalol x4 ordered and given to patient, with minimal response. Neuro MD paged again and deferred to [facility]. Day shift nurse consulting with TRH MD for BP management and assessment of neuro symptoms. MRI to be completed when able. All information given to oncoming RN who will continue to closely monitor patient.

## 2017-03-14 NOTE — Procedures (Signed)
Indication: infection  Risks of the procedure were dicussed with the patient including post-LP headache, bleeding, infection, weakness/numbness of legs(radiculopathy), death.  The patient's son agreed and written consent was obtained.   The patient was prepped and draped, and using sterile technique a 20 gauge quinke spinal needle was inserted in the L3/4 and L4/5space. Due to agitation unable to obtain fluid. Will need to be done under Fluoroscopy.   Etta Quill PA-C [facility] [phone]  M-F  (8:30 am- 4 PM)  03/14/2017, 1:20 PM

## 2017-03-14 NOTE — Progress Notes (Signed)
Paged Dr. Grandville Silos re: SBP >200 after given PRN hydralazine. MD requested manual BP. Manual BP 188/104, paged Dr. Grandville Silos to make aware. Awaiting orders and continuing to closely monitor pt.

## 2017-03-14 NOTE — Progress Notes (Signed)
PT Cancellation Note  Patient Details Name: Jared [name] MRN: [id number] DOB: 1956-08-29   Cancelled Treatment:    Reason Eval/Treat Not Completed: Patient at procedure or test/unavailable, current getting EEG. Will follow-up for PT evaluation as time allows.  [name], SPT Office-[phone]  [name] 03/14/2017, 1:38 PM

## 2017-03-14 NOTE — Progress Notes (Signed)
  Echocardiogram 2D Echocardiogram has been performed.  Jared [name] 03/14/2017, 5:02 PM

## 2017-03-14 NOTE — Progress Notes (Signed)
Pharmacy Antibiotic Note  Jared [name] is a 61 y.o. male admitted on 03/13/2017 with fever and altered mental status.  Pharmacy has been consulted for vancomycin and fortaz dosing.  Tmax 103, wbc normal at 7, HR 110s. Appears concern for early sepsis.   Patient is on CCPD  7x /week   EDW 75.5kg  CCPD=  6 exchanges  2500 fill vol. Dwell time 1 hr 75min  Per renal no plans for dialysis currently/today. Will load antibiotics and follow renal plan.   Plan: Vancomycin 2g IV load (~25mg /kg) today, plan to follow up with random vancomycin level in 2-3 days unless dialysis plans change. Redose vancomycin when level <20.  Load with Fortaz 1g IV now then will plan on 500mg  q24 hours. Follow up culture data Continue to follow PD/Renal plans  Height: 5\' 10"  (177.8 cm) Weight: 170 lb 14.4 oz (77.5 kg) IBW/kg (Calculated) : 73  Temp (24hrs), Avg:101.4 F (38.6 C), Min:98 F (36.7 C), Max:103 F (39.4 C)   Recent Labs Lab 03/13/17 1643 03/13/17 1649 03/14/17 0237  WBC 2.7*  --  7.0  CREATININE 9.88*  --  10.89*  LATICACIDVEN  --  1.35  --     Estimated Creatinine Clearance: 7.3 mL/min (A) (by C-G formula based on SCr of 10.89 mg/dL (H)).    Allergies  Allergen Reactions  . Penicillins Other (See Comments)    Has patient had a PCN reaction causing immediate rash, facial/tongue/throat swelling, SOB or lightheadedness with hypotension: Unk Has patient had a PCN reaction causing severe rash involving mucus membranes or skin necrosis: Unk Has patient had a PCN reaction that required hospitalization: Unk Has patient had a PCN reaction occurring within the last 10 years: Unk If all of the above answers are "NO", then may proceed with Cephalosporin use.     Antimicrobials this admission: Vancomycin 4/18>> Tressie Ellis 4/18>>  Dose adjustments this admission: n/a  Microbiology results: 4/17 peritoneal fluid pending 4/18 urine 4/18 Blood x 2 MRSA PCR neg  Thank you for allowing  pharmacy to be a part of this patient's care.  Erin Hearing PharmD., BCPS Clinical Pharmacist Pager [phone] 03/14/2017 1:38 PM

## 2017-03-14 NOTE — Progress Notes (Signed)
SLP Cancellation Note  Patient Details Name: Jared [name] MRN: [id number] DOB: 1956/04/30   Cancelled treatment:       Reason Eval/Treat Not Completed: Patient at procedure or test/unavailable. Will f/u as able.   [name] 03/14/2017, 10:11 AM  [name], M.A. CCC-SLP [phone]

## 2017-03-14 NOTE — Procedures (Signed)
ELECTROENCEPHALOGRAM REPORT  Date of Study: 03/14/2017  Patient's Name: Jared [name] MRN: [id number] Date of Birth: [birth year]  Referring Provider: Kerney Elbe, MD  Clinical History: 61 year old male with fever and altered mental status  Medications: ceftrizidine ceftriazone Vancomycin Hydralazine Metoprolol ondansetron  Technical Summary: A multichannel digital EEG recording measured by the international 10-20 system with electrodes applied with paste and impedances below 5000 ohms performed as portable with EKG monitoring.  Hyperventilation and photic stimulation were not performed.  The digital EEG was referentially recorded, reformatted, and digitally filtered in a variety of bipolar and referential montages for optimal display.   Description: The patient is awake but encephalopathic during the recording.  The background is symmetric with diffuse low-amplitude slowing.  There is no discernible posterior dominant rhythm.  There is muscle artifact due to patient shivering.  Stage 2 sleep was not seen.  There were no epileptiform discharges or electrographic seizures seen.    EKG lead was unremarkable.  Impression: This EEG is abnormal due to diffuse slowing of the waking background.  Clinical Correlation of the above findings indicates diffuse cerebral dysfunction that is non-specific in etiology and can be seen with hypoxic/ischemic injury, toxic/metabolic encephalopathies, neurodegenerative disorders, or medication effect.  The absence of epileptiform discharges does not rule out a clinical diagnosis of epilepsy.  Clinical correlation is advised.   Metta Clines, DO

## 2017-03-14 NOTE — Progress Notes (Signed)
OT Cancellation Note  Patient Details Name: Savier Trickett MRN: [id number] DOB: [birth year]   Cancelled Treatment:    Reason Eval/Treat Not Completed: Patient at procedure or test/ unavailable. Second attempt to see for OT evaluation this date. Pt at procedure and unavailable. Will check back for OT evaluation tomorrow.  Norman Herrlich, MS OTR/L  Pager: [phone]   Norman Herrlich 03/14/2017, 3:33 PM

## 2017-03-14 NOTE — Progress Notes (Signed)
SLP Cancellation Note  Patient Details Name: Jared [name] MRN: [id number] DOB: 1955-12-02   Cancelled treatment: Bedside swallow evaluation and cognitive-linguistic evaluation not completed as pt was at a procedure. ST will continue efforts.          Fransisca Kaufmann , [place] 03/14/2017, 3:07 PM

## 2017-03-14 NOTE — Progress Notes (Addendum)
PROGRESS NOTE    Jared [name]  ZMO:[id number] DOB: March 08, 1956 DOA: 03/13/2017 PCP: Harland Dingwall, NP-C    Brief Narrative:  Patient is a 61 year old gentleman history of end-stage renal disease on peritoneal dialysis, hypertension, insulin-dependent diabetes mellitus, chronic cough, recent initiation of treatment for prostate cancer and prior seizures in the setting of hypoglycemia presented to the ED with altered mental status changes. Patient noted to have fevers as high as 103. Patient also noted to have blood sugars greater than 1000. Neurology and nephrology consulted.   Assessment & Plan:   Principal Problem:   Acute encephalopathy Active Problems:   CKD (chronic kidney disease) stage V requiring chronic dialysis ([place])   Diabetes mellitus due to underlying condition, uncontrolled, without complication, with long-term current use of insulin (HCC)   Prostate cancer (HCC)   Hyperglycemia   Accelerated hypertension  #1 acute encephalopathy Questionable etiology. Patient noted on presentation to have a blood sugar of greater than 1000 and also noted to have high fevers. MAXIMUM TEMPERATURE of 103.Also concerns for seizures Patient was placed on the glucose stabilizer and has been transitioned off to Lantus. Blood sugars have improved from admission and arranging from 196-396. MRI MRA head negative for any acute abnormalities. Patient has been pancultured results pending. Peritoneal fluid has been cultured results pending. Patient still obtunded with spiking fevers. Check EEG. Will place empirically on IV vancomycin and IV Fortaz. Neurology following.  #2 diabetes mellitus uncontrolled and on insulin-dependent diabetes mellitus Blood sugars on admission was 1127. ?? Etiology. Concern for infectious etiology as patient noted to have fevers up to 103. Patient has been pancultured and results pending. Peritoneal fluid has been cultured. Patient was placed on glucose stabilizer and has  currently been transitioned to subcutaneous insulin. Increase Lantus to 10 units twice daily. Change CBGs 2 every 4 hours as patient currently nothing by mouth and obtunded. Follow.  #3 malignant hypertension Patient noted on admission to have systolic blood pressures in the 200s. Will place on Lopressor 5 mg IV every 8 hours. When necessary hydralazine. Discontinue Nitropaste. Monitor.  #4 end-stage renal disease on pertinent dialysis Nephrology has been consulted. Per nephrology.  #5 history of prostate cancer On Lupron injections. Outpatient follow-up with urology.  #6 anemia Follow H&H.    DVT prophylaxis: Heparin Code Status: Full Family Communication: Updated son at bedside. Disposition Plan: Pending medical improvement. SNF versus home.   Consultants:   Neurology: Dr.Stewart 03/13/2017  Nephrology 03/14/2017  Procedures:   MRI MRA head 03/14/2017  CT head 03/13/2017  CXR 03/15/2107  Antimicrobials:  IV vancomycin 03/14/2017  IV Tressie Ellis 03/14/2017   Subjective: Patient unresponsive obtunded restless.  Objective: Vitals:   03/14/17 0742 03/14/17 0800 03/14/17 0900 03/14/17 0945  BP:   (!) 189/78 (!) 161/78  Pulse:    98  Resp:   (!) 25 18  Temp: (!) 103 F (39.4 C) (!) 103 F (39.4 C)    TempSrc: Axillary     SpO2:    98%  Weight:      Height:        Intake/Output Summary (Last 24 hours) at 03/14/17 1146 Last data filed at 03/14/17 0900  Gross per 24 hour  Intake             1000 ml  Output              325 ml  Net              675 ml   Filed  Weights   03/14/17 0530  Weight: 77.5 kg (170 lb 14.4 oz)    Examination:  General exam: Unresponsive. Restless. Respiratory system: Clear to auscultation anterior lung fields.Marland Kitchen Respiratory effort normal. Cardiovascular system: S1 & S2 heard, Tachycardia. No JVD, murmurs, rubs, gallops or clicks. No pedal edema. Gastrointestinal system: Abdomen is nondistended, soft and nontender. No organomegaly or  masses felt. Normal bowel sounds heard. Central nervous system: unresponsive. Obtunded. Extremities: Moving extremities spontaneously. Skin: No rashes, lesions or ulcers Psychiatry: Unable to assess.    Data Reviewed: I have personally reviewed following labs and imaging studies  CBC:  Recent Labs Lab 03/13/17 1643 03/14/17 0237  WBC 2.7* 7.0  NEUTROABS 1.7  --   HGB 8.9* 9.9*  HCT 25.9* 29.0*  MCV 81.2 79.7  PLT 158 956   Basic Metabolic Panel:  Recent Labs Lab 03/13/17 1643 03/14/17 0237  NA 122* 135  K 5.6* 4.6  CL 87* 97*  CO2 20* 23  GLUCOSE 1,127* 247*  BUN 55* 58*  CREATININE 9.88* 10.89*  CALCIUM 8.1* 8.9   GFR: Estimated Creatinine Clearance: 7.3 mL/min (A) (by C-G formula based on SCr of 10.89 mg/dL (H)). Liver Function Tests:  Recent Labs Lab 03/13/17 1643  AST 23  ALT 18  ALKPHOS 49  BILITOT 0.6  PROT 5.8*  ALBUMIN 2.7*   No results for input(s): LIPASE, AMYLASE in the last 168 hours. No results for input(s): AMMONIA in the last 168 hours. Coagulation Profile: No results for input(s): INR, PROTIME in the last 168 hours. Cardiac Enzymes:  Recent Labs Lab 03/13/17 2349 03/14/17 0237 03/14/17 0825  TROPONINI <0.03 0.04* 0.06*   BNP (last 3 results) No results for input(s): PROBNP in the last 8760 hours. HbA1C: No results for input(s): HGBA1C in the last 72 hours. CBG:  Recent Labs Lab 03/14/17 0220 03/14/17 0322 03/14/17 0611 03/14/17 0903 03/14/17 1129  GLUCAP 223* 196* 297* 396* 261*   Lipid Profile:  Recent Labs  03/14/17 0237  CHOL 150  HDL 64  LDLCALC 79  TRIG 37  CHOLHDL 2.3   Thyroid Function Tests: No results for input(s): TSH, T4TOTAL, FREET4, T3FREE, THYROIDAB in the last 72 hours. Anemia Panel: No results for input(s): VITAMINB12, FOLATE, FERRITIN, TIBC, IRON, RETICCTPCT in the last 72 hours. Sepsis Labs:  Recent Labs Lab 03/13/17 1649  LATICACIDVEN 1.35    Recent Results (from the past 240  hour(s))  Gram stain     Status: None   Collection Time: 03/13/17  8:15 PM  Result Value Ref Range Status   Specimen Description PERITONEAL  Final   Special Requests NONE  Final   Gram Stain   Final    DIRECT SMEAR NO WBC SEEN NO ORGANISMS SEEN RESULT CALLED TO, READ BACK BY AND VERIFIED WITH: M.PETERS,RN 2200 03/13/17 M.CAMPBELL    Report Status 03/13/2017 FINAL  Final  Culture, body fluid-bottle     Status: None (Preliminary result)   Collection Time: 03/13/17  8:15 PM  Result Value Ref Range Status   Specimen Description FLUID PERITONEAL  Final   Special Requests   Final    BOTTLES DRAWN AEROBIC AND ANAEROBIC Blood Culture adequate volume   Culture PENDING  Incomplete   Report Status PENDING  Incomplete  MRSA PCR Screening     Status: None   Collection Time: 03/14/17  5:18 AM  Result Value Ref Range Status   MRSA by PCR NEGATIVE NEGATIVE Final    Comment:        The  GeneXpert MRSA Assay (FDA approved for NASAL specimens only), is one component of a comprehensive MRSA colonization surveillance program. It is not intended to diagnose MRSA infection nor to guide or monitor treatment for MRSA infections.          Radiology Studies: Ct Head Wo Contrast  Result Date: 03/13/2017 CLINICAL DATA:  Altered mental status with right-sided facial droop and confusion EXAM: CT HEAD WITHOUT CONTRAST TECHNIQUE: Contiguous axial images were obtained from the base of the skull through the vertex without intravenous contrast. COMPARISON:  MRI 11/27/2016, CT brain 11/24/2016 FINDINGS: Brain: No acute territorial infarction, hemorrhage, or focal mass lesion is visualized. There is mild to moderate atrophy. The ventricles are nonenlarged. Vascular: No hyperdense vessels. Scattered calcifications at the carotid siphons. Mild vertebral artery calcification Skull: No fracture. Stable ground-glass slightly expansile lesion in the anterior medial wall of the left maxillary sinus. Sinuses/Orbits:  Mild mucosal thickening in the ethmoid sinuses and maxillary sinuses. No acute orbital abnormality. Other: None IMPRESSION: No definite CT evidence for acute intracranial abnormality. Electronically Signed   By: Donavan Foil M.D.   On: 03/13/2017 20:51   Mr Jodene Nam Head Wo Contrast  Result Date: 03/14/2017 CLINICAL DATA:  61 year old male with end-stage renal disease on peritoneal dialysis. Recent initiation of treatment for prostate cancer. Mental status changes, inappropriate speech, left hand tremor. EXAM: MRI HEAD WITHOUT CONTRAST MRA HEAD WITHOUT CONTRAST TECHNIQUE: Multiplanar, multiecho pulse sequences of the brain and surrounding structures were obtained without intravenous contrast. Angiographic images of the head were obtained using MRA technique without contrast. COMPARISON:  Head CT without contrast 03/13/2017. Brain MRI 11/27/2016 FINDINGS: MRI HEAD FINDINGS Study is mildly degraded by motion artifact despite repeated imaging attempts. Brain: No restricted diffusion to suggest acute infarction. No midline shift, mass effect, evidence of mass lesion, ventriculomegaly, extra-axial collection or acute intracranial hemorrhage. Cervicomedullary junction and pituitary are within normal limits. Pearline Cables and white matter signal appears stable and normal for age throughout the brain. No encephalomalacia or chronic cerebral blood products identified. Vascular: Major intracranial vascular flow voids are stable with mild intracranial artery tortuosity. Skull and upper cervical spine: Visible bone marrow signal is stable and normal. Negative visible cervical spine. Sinuses/Orbits: Stable and negative. Other: Stable posterior left scalp lipoma (series 5, image 4). Otherwise negative scalp soft tissues. Visible internal auditory structures appear normal. Mild bilateral mastoid effusions seen in January have largely resolved. MRA HEAD FINDINGS Antegrade flow in the posterior circulation with codominant distal vertebral  arteries. No distal vertebral stenosis. No basilar artery stenosis. AICA, SCA and PCA origins appear normal. Bilateral PCA branches are within normal limits. Posterior communicating arteries are diminutive or absent. Antegrade flow in both ICA siphons. Tortuous cavernous segments. No ICA siphon stenosis. Ophthalmic artery origins appear to remain patent. Patent carotid termini. MCA and ACA origins are normal. Tortuous A1 segments, more so the left. Diminutive anterior communicating artery. Visible ACA branches are normal aside from mild tortuosity. Normal MCA M1 segments. Bilateral MCA bifurcations and visible bilateral MCA branches are within normal limits. IMPRESSION: 1. No acute intracranial abnormality. Stable since January and negative noncontrast MRI appearance of the brain. 2.  Negative intracranial MRA. Electronically Signed   By: Genevie Ann M.D.   On: 03/14/2017 11:27   Mr Brain Wo Contrast  Result Date: 03/14/2017 CLINICAL DATA:  61 year old male with end-stage renal disease on peritoneal dialysis. Recent initiation of treatment for prostate cancer. Mental status changes, inappropriate speech, left hand tremor. EXAM: MRI HEAD WITHOUT CONTRAST MRA  HEAD WITHOUT CONTRAST TECHNIQUE: Multiplanar, multiecho pulse sequences of the brain and surrounding structures were obtained without intravenous contrast. Angiographic images of the head were obtained using MRA technique without contrast. COMPARISON:  Head CT without contrast 03/13/2017. Brain MRI 11/27/2016 FINDINGS: MRI HEAD FINDINGS Study is mildly degraded by motion artifact despite repeated imaging attempts. Brain: No restricted diffusion to suggest acute infarction. No midline shift, mass effect, evidence of mass lesion, ventriculomegaly, extra-axial collection or acute intracranial hemorrhage. Cervicomedullary junction and pituitary are within normal limits. Pearline Cables and white matter signal appears stable and normal for age throughout the brain. No  encephalomalacia or chronic cerebral blood products identified. Vascular: Major intracranial vascular flow voids are stable with mild intracranial artery tortuosity. Skull and upper cervical spine: Visible bone marrow signal is stable and normal. Negative visible cervical spine. Sinuses/Orbits: Stable and negative. Other: Stable posterior left scalp lipoma (series 5, image 4). Otherwise negative scalp soft tissues. Visible internal auditory structures appear normal. Mild bilateral mastoid effusions seen in January have largely resolved. MRA HEAD FINDINGS Antegrade flow in the posterior circulation with codominant distal vertebral arteries. No distal vertebral stenosis. No basilar artery stenosis. AICA, SCA and PCA origins appear normal. Bilateral PCA branches are within normal limits. Posterior communicating arteries are diminutive or absent. Antegrade flow in both ICA siphons. Tortuous cavernous segments. No ICA siphon stenosis. Ophthalmic artery origins appear to remain patent. Patent carotid termini. MCA and ACA origins are normal. Tortuous A1 segments, more so the left. Diminutive anterior communicating artery. Visible ACA branches are normal aside from mild tortuosity. Normal MCA M1 segments. Bilateral MCA bifurcations and visible bilateral MCA branches are within normal limits. IMPRESSION: 1. No acute intracranial abnormality. Stable since January and negative noncontrast MRI appearance of the brain. 2.  Negative intracranial MRA. Electronically Signed   By: Genevie Ann M.D.   On: 03/14/2017 11:27   Dg Chest Port 1 View  Result Date: 03/14/2017 CLINICAL DATA:  Fever, history of diabetes, chronic renal insufficiency, seizure disorder, former smoker. EXAM: PORTABLE CHEST 1 VIEW COMPARISON:  Portable chest x-ray of March 13, 2017 FINDINGS: The lungs are mildly hypoinflated but clear. The cardiac silhouette is mildly enlarged. The pulmonary vascularity is normal. The mediastinum is normal in width. There is no  pleural effusion. The bony thorax is unremarkable. IMPRESSION: Stable appearance of the chest. Mild cardiomegaly in which is accentuated by hypoinflation. No pneumonia or pulmonary edema. Electronically Signed   By: David  Martinique M.D.   On: 03/14/2017 08:49   Dg Chest Port 1 View  Result Date: 03/13/2017 CLINICAL DATA:  Patient was found wandering outside. Last seen normal at 1400 hours today. Right-sided facial droop. Confusion and weakness. History of diabetes. EXAM: PORTABLE CHEST 1 VIEW COMPARISON:  11/26/2016 FINDINGS: Shallow inspiration. Normal heart size and pulmonary vascularity. No focal airspace disease or consolidation in the lungs. No blunting of costophrenic angles. No pneumothorax. Mediastinal contours appear intact. IMPRESSION: No active disease. Electronically Signed   By: Lucienne Capers M.D.   On: 03/13/2017 22:11        Scheduled Meds: . insulin aspart  0-9 Units Subcutaneous TID WC  . insulin glargine  10 Units Subcutaneous BID  . insulin glargine  5 Units Subcutaneous Once  . metoprolol  5 mg Intravenous Q8H  . nitroGLYCERIN  1 inch Topical Q6H  . sodium chloride flush  3 mL Intravenous Q12H   Continuous Infusions:   LOS: 1 day    Time spent: 40 mins    Amrita Radu,  MD [facility] Pager [phone]  If 7PM-7AM, please contact night-coverage [email or web address] Password [facility] 03/14/2017, 11:46 AM

## 2017-03-14 NOTE — Progress Notes (Signed)
OT Cancellation Note  Patient Details Name: Jared [name] MRN: [id number] DOB: 02/26/1956   Cancelled Treatment:    Reason Eval/Treat Not Completed: Medical issues which prohibited therapy.  RN requests OT hold this am due to pt agitated and still working on managing BS.  Will reattempt.  [facility], OTR/L [phone]   Lucille Passy M 03/14/2017, 10:24 AM

## 2017-03-14 NOTE — Progress Notes (Signed)
Bedside EEG completed, results pending.  Muscle artifact prevalent.

## 2017-03-15 ENCOUNTER — Encounter (HOSPITAL_COMMUNITY): Payer: Self-pay

## 2017-03-15 ENCOUNTER — Inpatient Hospital Stay (HOSPITAL_COMMUNITY): Payer: Medicare Other

## 2017-03-15 DIAGNOSIS — R509 Fever, unspecified: Secondary | ICD-10-CM

## 2017-03-15 DIAGNOSIS — R4781 Slurred speech: Secondary | ICD-10-CM

## 2017-03-15 DIAGNOSIS — Z880 Allergy status to penicillin: Secondary | ICD-10-CM

## 2017-03-15 DIAGNOSIS — R531 Weakness: Secondary | ICD-10-CM

## 2017-03-15 DIAGNOSIS — Z87891 Personal history of nicotine dependence: Secondary | ICD-10-CM

## 2017-03-15 DIAGNOSIS — Z801 Family history of malignant neoplasm of trachea, bronchus and lung: Secondary | ICD-10-CM

## 2017-03-15 DIAGNOSIS — Z809 Family history of malignant neoplasm, unspecified: Secondary | ICD-10-CM

## 2017-03-15 DIAGNOSIS — E1122 Type 2 diabetes mellitus with diabetic chronic kidney disease: Secondary | ICD-10-CM

## 2017-03-15 LAB — VAS US CAROTID
LCCAPSYS: 85 cm/s
LEFT ECA DIAS: -8 cm/s
LEFT VERTEBRAL DIAS: -16 cm/s
LICADDIAS: -11 cm/s
LICAPSYS: -62 cm/s
Left CCA dist dias: -17 cm/s
Left CCA dist sys: -81 cm/s
Left CCA prox dias: 9 cm/s
Left ICA dist sys: -52 cm/s
Left ICA prox dias: -16 cm/s
RCCAPDIAS: -11 cm/s
RIGHT ECA DIAS: -7 cm/s
RIGHT VERTEBRAL DIAS: -15 cm/s
Right CCA prox sys: -58 cm/s
Right cca dist sys: -66 cm/s

## 2017-03-15 LAB — CBC WITH DIFFERENTIAL/PLATELET
BASOS PCT: 0 %
Basophils Absolute: 0 10*3/uL (ref 0.0–0.1)
EOS PCT: 0 %
Eosinophils Absolute: 0 10*3/uL (ref 0.0–0.7)
HEMATOCRIT: 28 % — AB (ref 39.0–52.0)
Hemoglobin: 9.3 g/dL — ABNORMAL LOW (ref 13.0–17.0)
LYMPHS PCT: 17 %
Lymphs Abs: 1.7 10*3/uL (ref 0.7–4.0)
MCH: 26.8 pg (ref 26.0–34.0)
MCHC: 33.2 g/dL (ref 30.0–36.0)
MCV: 80.7 fL (ref 78.0–100.0)
MONO ABS: 0.5 10*3/uL (ref 0.1–1.0)
Monocytes Relative: 5 %
NEUTROS ABS: 7.8 10*3/uL — AB (ref 1.7–7.7)
Neutrophils Relative %: 78 %
Platelets: 196 10*3/uL (ref 150–400)
RBC: 3.47 MIL/uL — ABNORMAL LOW (ref 4.22–5.81)
RDW: 16.2 % — AB (ref 11.5–15.5)
WBC: 10 10*3/uL (ref 4.0–10.5)

## 2017-03-15 LAB — URINE CULTURE: CULTURE: NO GROWTH

## 2017-03-15 LAB — RENAL FUNCTION PANEL
ALBUMIN: 2.5 g/dL — AB (ref 3.5–5.0)
ANION GAP: 16 — AB (ref 5–15)
BUN: 77 mg/dL — ABNORMAL HIGH (ref 6–20)
CALCIUM: 8.7 mg/dL — AB (ref 8.9–10.3)
CO2: 21 mmol/L — ABNORMAL LOW (ref 22–32)
Chloride: 97 mmol/L — ABNORMAL LOW (ref 101–111)
Creatinine, Ser: 12.48 mg/dL — ABNORMAL HIGH (ref 0.61–1.24)
GFR, EST AFRICAN AMERICAN: 4 mL/min — AB (ref >60)
GFR, EST NON AFRICAN AMERICAN: 4 mL/min — AB (ref >60)
Glucose, Bld: 88 mg/dL (ref 65–99)
PHOSPHORUS: 9.1 mg/dL — AB (ref 2.5–4.6)
POTASSIUM: 5.5 mmol/L — AB (ref 3.5–5.1)
SODIUM: 134 mmol/L — AB (ref 135–145)

## 2017-03-15 LAB — RESPIRATORY PANEL BY PCR
ADENOVIRUS-RVPPCR: NOT DETECTED
Bordetella pertussis: NOT DETECTED
CHLAMYDOPHILA PNEUMONIAE-RVPPCR: NOT DETECTED
CORONAVIRUS HKU1-RVPPCR: NOT DETECTED
CORONAVIRUS NL63-RVPPCR: NOT DETECTED
CORONAVIRUS OC43-RVPPCR: NOT DETECTED
Coronavirus 229E: NOT DETECTED
INFLUENZA A-RVPPCR: NOT DETECTED
Influenza B: NOT DETECTED
METAPNEUMOVIRUS-RVPPCR: NOT DETECTED
Mycoplasma pneumoniae: NOT DETECTED
PARAINFLUENZA VIRUS 3-RVPPCR: NOT DETECTED
PARAINFLUENZA VIRUS 4-RVPPCR: NOT DETECTED
Parainfluenza Virus 1: NOT DETECTED
Parainfluenza Virus 2: NOT DETECTED
RHINOVIRUS / ENTEROVIRUS - RVPPCR: NOT DETECTED
Respiratory Syncytial Virus: NOT DETECTED

## 2017-03-15 LAB — GLUCOSE, CAPILLARY
GLUCOSE-CAPILLARY: 166 mg/dL — AB (ref 65–99)
GLUCOSE-CAPILLARY: 242 mg/dL — AB (ref 65–99)
GLUCOSE-CAPILLARY: 89 mg/dL (ref 65–99)
GLUCOSE-CAPILLARY: 90 mg/dL (ref 65–99)
Glucose-Capillary: 84 mg/dL (ref 65–99)
Glucose-Capillary: 63 mg/dL — ABNORMAL LOW (ref 65–99)
Glucose-Capillary: 72 mg/dL (ref 65–99)
Glucose-Capillary: 150 mg/dL — ABNORMAL HIGH (ref 65–99)

## 2017-03-15 LAB — HIV ANTIBODY (ROUTINE TESTING W REFLEX): HIV Screen 4th Generation wRfx: NONREACTIVE

## 2017-03-15 MED ORDER — FLUCONAZOLE IN SODIUM CHLORIDE 100-0.9 MG/50ML-% IV SOLN
100.0000 mg | INTRAVENOUS | Status: DC
  Administered 2017-03-16 – 2017-03-19 (×4): 100 mg via INTRAVENOUS
  Filled 2017-03-15 (×6): qty 50

## 2017-03-15 MED ORDER — HEPARIN 1000 UNIT/ML FOR PERITONEAL DIALYSIS
2500.0000 [IU] | INTRAMUSCULAR | Status: DC | PRN
  Filled 2017-03-15: qty 2.5

## 2017-03-15 MED ORDER — HEPARIN 1000 UNIT/ML FOR PERITONEAL DIALYSIS: 500.0000 [IU] | INTRAMUSCULAR | Status: DC | PRN

## 2017-03-15 MED ORDER — GENTAMICIN SULFATE 0.1 % EX CREA
1.0000 "application " | TOPICAL_CREAM | Freq: Every day | CUTANEOUS | Status: DC
  Administered 2017-03-16: 1 via TOPICAL
  Filled 2017-03-15: qty 15

## 2017-03-15 MED ORDER — DARBEPOETIN ALFA 150 MCG/0.3ML IJ SOSY
150.0000 ug | PREFILLED_SYRINGE | INTRAMUSCULAR | Status: DC
Start: 2017-03-19 — End: 2017-03-20
  Filled 2017-03-15: qty 0.3

## 2017-03-15 MED ORDER — NICARDIPINE HCL IN NACL 20-0.86 MG/200ML-% IV SOLN
3.0000 mg/h | INTRAVENOUS | Status: DC
  Administered 2017-03-15: 12 mg/h via INTRAVENOUS
  Administered 2017-03-15: 3 mg/h via INTRAVENOUS
  Administered 2017-03-15: 10 mg/h via INTRAVENOUS
  Administered 2017-03-15: 8 mg/h via INTRAVENOUS
  Administered 2017-03-15: 10 mg/h via INTRAVENOUS
  Administered 2017-03-16: 12 mg/h via INTRAVENOUS
  Administered 2017-03-16 (×2): 10 mg/h via INTRAVENOUS
  Filled 2017-03-15 (×7): qty 200

## 2017-03-15 MED ORDER — DEXTROSE-NACL 5-0.9 % IV SOLN
INTRAVENOUS | Status: DC
  Administered 2017-03-15: 09:00:00 via INTRAVENOUS

## 2017-03-15 MED ORDER — FLUCONAZOLE IN SODIUM CHLORIDE 200-0.9 MG/100ML-% IV SOLN
200.0000 mg | Freq: Once | INTRAVENOUS | Status: AC
  Administered 2017-03-15: 200 mg via INTRAVENOUS
  Filled 2017-03-15: qty 100

## 2017-03-15 MED ORDER — DELFLEX-LC/2.5% DEXTROSE 394 MOSM/L IP SOLN: INTRAPERITONEAL | Status: DC

## 2017-03-15 MED ORDER — INSULIN GLARGINE 100 UNIT/ML ~~LOC~~ SOLN
5.0000 [IU] | Freq: Two times a day (BID) | SUBCUTANEOUS | Status: DC
  Filled 2017-03-15: qty 0.05

## 2017-03-15 MED ORDER — INSULIN GLARGINE 100 UNIT/ML ~~LOC~~ SOLN: 5.0000 [IU] | Freq: Two times a day (BID) | SUBCUTANEOUS | Status: DC

## 2017-03-15 MED ORDER — IOPAMIDOL (ISOVUE-300) INJECTION 61%
INTRAVENOUS | Status: AC
  Administered 2017-03-15: 100 mL
  Filled 2017-03-15: qty 100

## 2017-03-15 MED ORDER — DELFLEX-LC/2.5% DEXTROSE 394 MOSM/L IP SOLN: Freq: Four times a day (QID) | INTRAPERITONEAL | Status: AC

## 2017-03-15 MED ORDER — HEPARIN 1000 UNIT/ML FOR PERITONEAL DIALYSIS
INTRAPERITONEAL | Status: DC | PRN
  Filled 2017-03-15: qty 5000

## 2017-03-15 NOTE — Progress Notes (Addendum)
Neurology Progress Note  Subjective: He has remained encephalopathic, no real change in mental status per RN and notes. RN reports that he becomes agitated during care and with stimulation. He has been intermittently febrile and was on a cooling blanket overnight but this was turned off this morning. He does not participate with the exam or with ROS because of his encephalopathy.   LP attempted under fluoro but he remained too agitated for this to be safely completed even after getting Ativan and Haldol and the procedure was aborted.   Medications reviewed and reconciled.   Pertinent meds: Ceftazidime 500 mg q24h  Current Meds:   Current Facility-Administered Medications:  .  acetaminophen (TYLENOL) tablet 650 mg, 650 mg, Oral, Q6H PRN **OR** acetaminophen (TYLENOL) suppository 650 mg, 650 mg, Rectal, Q6H PRN, Lily Kocher, MD, 650 mg at 03/14/17 2356 .  cefTAZidime (FORTAZ) 500 mg in dextrose 5 % 50 mL IVPB, 500 mg, Intravenous, Q24H, Lyndee Leo, RPH .  chlorhexidine (PERIDEX) 0.12 % solution 15 mL, 15 mL, Mouth Rinse, BID, Eugenie Filler, MD, 15 mL at 03/14/17 2125 .  dextrose 5 %-0.9 % sodium chloride infusion, , Intravenous, Continuous, Irine Seal V, MD .  dextrose 50 % solution 25 mL, 25 mL, Intravenous, PRN, Veryl Speak, MD, 25 mL at 03/15/17 0818 .  heparin injection 5,000 Units, 5,000 Units, Subcutaneous, Q8H, Eugenie Filler, MD, 5,000 Units at 03/15/17 0510 .  hydrALAZINE (APRESOLINE) injection 20 mg, 20 mg, Intravenous, Q6H PRN, Lily Kocher, MD, 20 mg at 03/14/17 1748 .  insulin aspart (novoLOG) injection 0-9 Units, 0-9 Units, Subcutaneous, TID WC, Eugenie Filler, MD, 5 Units at 03/14/17 1156 .  insulin glargine (LANTUS) injection 10 Units, 10 Units, Subcutaneous, BID, Eugenie Filler, MD, 10 Units at 03/14/17 2124 .  MEDLINE mouth rinse, 15 mL, Mouth Rinse, q12n4p, Eugenie Filler, MD, 15 mL at 03/14/17 1738 .  nicardipine (CARDENE) 20mg  in 0.86% saline  23ml IV infusion (0.1 mg/ml), 3-15 mg/hr, Intravenous, Continuous, Irine Seal V, MD .  ondansetron [facility]) tablet 4 mg, 4 mg, Oral, Q6H PRN **OR** ondansetron (ZOFRAN) injection 4 mg, 4 mg, Intravenous, Q6H PRN, Lily Kocher, MD .  sodium chloride flush (NS) 0.9 % injection 3 mL, 3 mL, Intravenous, Q12H, Lily Kocher, MD, 3 mL at 03/14/17 2125  Objective:  Temp:  [99.7 F (37.6 C)-102.4 F (39.1 C)] 99.8 F (37.7 C) (04/19 0820) Pulse Rate:  [82-156] 99 (04/19 0800) Resp:  [17-35] 22 (04/19 0800) BP: (155-222)/(74-124) 187/112 (04/19 0800) SpO2:  [90 %-99 %] 97 % (04/19 0800) Weight:  [77.1 kg (170 lb)-78.3 kg (172 lb 9.6 oz)] 78.3 kg (172 lb 9.6 oz) (04/19 0500)  General: WDWN AA male lying on L side sleeping soundly. He becomes mildly agitated with stimulation so I did not attempt to wake him and limited the exam so as to prevent excessive agitation.  HEENT: Neck is supple without lymphadenopathy. Sclerae are anicteric. There is no conjunctival injection.  CV: Regular, no murmur. Distal pulses 2+ and symmetric.  Lungs: CTAB  Extremities: No C/C/E. Neuro: MS: As noted above.  CN: Corneals are intact. His face appears symmetric at rest but he is lying on his side so subtle asymmetry may be missed. The remainder of his cranial nerve examination is deferred due to encephalopathy.  Motor: Normal bulk, tone. No tremor or other abnormal movements are observed.  DTRs: 2+, symmetric in the arms and at both knees. Toes are downgoing bilaterally. No pathological reflexes.  Coordination/gait: Deferred due to encephalopathy.  Labs: Lab Results  Component Value Date   WBC 10.0 03/15/2017   HGB 9.3 (L) 03/15/2017   HCT 28.0 (L) 03/15/2017   PLT 196 03/15/2017   GLUCOSE 88 03/15/2017   CHOL 150 03/14/2017   TRIG 37 03/14/2017   HDL 64 03/14/2017   LDLCALC 79 03/14/2017   ALT 18 03/13/2017   AST 23 03/13/2017   NA 134 (L) 03/15/2017   K 5.5 (H) 03/15/2017   CL 97 (L) 03/15/2017    CREATININE 12.48 (H) 03/15/2017   BUN 77 (H) 03/15/2017   CO2 21 (L) 03/15/2017   TSH 2.22 01/04/2017   PSA 353.00 (H) 12/09/2016   INR 1.17 11/24/2016   HGBA1C 8.5 (H) 03/14/2017   CBC Latest Ref Rng & Units 03/15/2017 03/14/2017 03/13/2017  WBC 4.0 - 10.5 K/uL 10.0 7.0 2.7(L)  Hemoglobin 13.0 - 17.0 g/dL 9.3(L) 9.9(L) 8.9(L)  Hematocrit 39.0 - 52.0 % 28.0(L) 29.0(L) 25.9(L)  Platelets 150 - 400 K/uL 196 202 158    Lab Results  Component Value Date   HGBA1C 8.5 (H) 03/14/2017   Lab Results  Component Value Date   ALT 18 03/13/2017   AST 23 03/13/2017   ALKPHOS 49 03/13/2017   BILITOT 0.6 03/13/2017    Radiology:  There is no new neuroimaging for review.   Other diagnostic studies:  TTE showed normal LV size and systolic function with EF 60-65%; no regional wall motion abnormalities; grade 1 diastolic dysfunction; mild calcification of aortic valve leaflets; mild calcification of the mitral valve annulus; increased thickness of atrial septum c/w lipomatous hypertrophy  A/P:  1. Acute encephalopathy: This persists. This is most likely toxic-metabolic in etiology with contributions from fever, possible infection, severe hyperglycemia with associated hyperosmolality, worsening renal failure and uremia. Neuroimaging has not revealed any acute stroke, hemorrhage, or other CNS pathology that would explain his presentation. EEG showed only diffuse slowing, no evidence of seizures or epileptiform focus. CNS infection must be considered given his fever and altered mental status but he has been too agitated for an LP thus far. My suspicion for meningitis or encephalitis is low, however. Continue to optimize metabolic status as you are. Continue to empiric antibiotics to treat any potential underlying infection--ceftazidime offers reasonable CNS coverage. Minimize the use of opiates, benzos or any medication with strong anticholinergic properties as much as possible. Optimize sleep-wake cycles as  much as you can by keeping the room bright with activity during the day and dark and quiet at night. For agitation, recommend low-dose haloperidol or an atypical antipsychotic.   2. Possible facial droop: This was reported prior to presentation. No droop has been appreciated on my exam. MRI was negative for stroke. Cannot fully exclude the possibility of TIA but this could also have been due to his extreme hyperglycemia. TTE is unremarkable. CUS pending. LDL 79. Hgb a1c 8.5. He may benefit from the addition of low-dose aspirin when able to take POs. Resume statin when able to take POs.   Family updated at the bedside.   Melba Coon, MD [facility]

## 2017-03-15 NOTE — Evaluation (Signed)
Physical Therapy Evaluation Patient Details Name: Jared [name] MRN: [id number] DOB: 01/21/1956 Today's Date: 03/15/2017   History of Present Illness  Pt is a 61 y.o. male admitted to ED on 03/13/17 with AMS, hyperglycemia (>1000), and fevers. CT and MRI negative for acute infarct; additional workup is pending. EEG 4/18 abnormal with diffuse slowing of waking background. Pertinent PMH includes ESRD (on home dialysis), DM, seizures, HTN, chronic cough, prostate CA.     Clinical Impression  Pt presents to PT with lethargy, impaired communication, decreased arousal, impaired sensation, and an overall decrease in functional mobility secondary to above. Family present throughout session and reports that PTA, pt indep with all mobility and lives with sister's family; family available for 24-7 assist if needed. Today, pt required totalA (+2) for bed mobility and totalA to min guard for sitting balance. Limited by lethargy, but intermittently opens eyes to max stimulation. Only verbalization was "no" when bed mobility initiated. Pt would benefit from continued acute PT services to maximize functional mobility and independence.    Follow Up Recommendations CIR;Supervision/Assistance - 24 hour    Equipment Recommendations  Other (comment) (TBD)    Recommendations for Other Services Rehab consult     Precautions / Restrictions Precautions Precautions: Fall Restrictions Weight Bearing Restrictions: No      Mobility  Bed Mobility Overal bed mobility: Needs Assistance Bed Mobility: Supine to Sit;Sit to Supine     Supine to sit: +2 for physical assistance;Total assist;HOB elevated Sit to supine: Total assist;+2 for physical assistance   General bed mobility comments: TotalA (+2) to sit EOB; pt actively resisting movement throughout.   Transfers                    Ambulation/Gait                Stairs            Wheelchair Mobility    Modified Rankin (Stroke  Patients Only) Modified Rankin (Stroke Patients Only) Pre-Morbid Rankin Score: No symptoms Modified Rankin: Severe disability     Balance Overall balance assessment: Needs assistance Sitting-balance support: No upper extremity supported;Feet supported Sitting balance-Leahy Scale: Poor Sitting balance - Comments: Pt initially maxA to maintain balance, progressing to min guard for sitting balance with forward flexed posture >5 min. With increased fatigue, required totalA for sitting with significant R lateral lean. Postural control: Right lateral lean     Standing balance comment: Not attempted                             Pertinent Vitals/Pain Pain Assessment: Faces Faces Pain Scale: Hurts a little bit Pain Location: no response to painful stimuli but intermittent responses to noxious stimuli (trying to get gown off, glove) Pain Descriptors / Indicators: Discomfort Pain Intervention(s): Limited activity within patient's tolerance;Monitored during session;Repositioned    Home Living Family/patient expects to be discharged to:: Private residence Living Arrangements: Other relatives (Sister & brother-in-law) Available Help at Discharge: Family;Available 24 hours/day Type of Home: House Home Access: Level entry     Home Layout: Two level;Bed/bath upstairs Home Equipment: None      Prior Function Level of Independence: Independent         Comments: Works periodically transporting cars; 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      Hand Dominance        Extremity/Trunk Assessment   Upper Extremity Assessment Upper Extremity Assessment: Defer to OT evaluation  Lower Extremity Assessment Lower Extremity Assessment: RLE deficits/detail;LLE deficits/detail RLE Deficits / Details: No active movement in RLE; no response to painful stimuli in BLE.  RLE Coordination: decreased gross motor;decreased fine motor LLE Deficits / Details: No active movement in RLE; no  response to painful stimuli in BLE.  LLE Coordination: decreased gross motor;decreased fine motor       Communication   Communication: Receptive difficulties;Expressive difficulties  Cognition Arousal/Alertness: Lethargic Behavior During Therapy: Flat affect Overall Cognitive Status: Impaired/Different from baseline Area of Impairment: Attention                   Current Attention Level: Focused           General Comments: Pt lethargic but open eyes intermittently to stimulation, especially tactile stimulation to L-side. Only verbalization was to say "no" when therapists began bed mobility. Did not follow any commands. Did not respond to painful stimuli; no purposeful movement on RUE/RLE. Made spontaneous attempts with LUE to remove gown and glove.      General Comments General comments (skin integrity, edema, etc.): VSS    Exercises     Assessment/Plan    PT Assessment Patient needs continued PT services  PT Problem List Decreased strength;Decreased range of motion;Decreased cognition;Decreased knowledge of use of DME;Impaired tone;Decreased activity tolerance;Decreased safety awareness;Decreased skin integrity;Decreased balance;Pain;Decreased knowledge of precautions;Decreased mobility;Decreased coordination;Impaired sensation       PT Treatment Interventions DME instruction;Gait training;Neuromuscular re-education;Cognitive remediation;Stair training;Patient/family education;Functional mobility training;Therapeutic activities;Therapeutic exercise;Balance training    PT Goals (Current goals can be found in the Care Plan section)  Acute Rehab PT Goals Patient Stated Goal: Unable to state PT Goal Formulation: Patient unable to participate in goal setting Time For Goal Achievement: 03/29/17 Potential to Achieve Goals: Fair    Frequency Min 3X/week   Barriers to discharge        Co-evaluation PT/OT/SLP Co-Evaluation/Treatment: Yes Reason for Co-Treatment:  Complexity of the patient's impairments (multi-system involvement);Necessary to address cognition/behavior during functional activity;For patient/therapist safety     SLP goals addressed during session: Swallowing;Cognition;Communication     End of Session   Activity Tolerance: Patient limited by fatigue;Patient limited by lethargy Patient left: in bed;with call bell/phone within reach;with bed alarm set;with family/visitor present Nurse Communication: Mobility status PT Visit Diagnosis: Other abnormalities of gait and mobility (R26.89)    Time: 9449-6759 PT Time Calculation (min) (ACUTE ONLY): 29 min   Charges:   PT Evaluation $PT Eval Moderate Complexity: 1 Procedure     PT G Codes:       [name], SPT [phone]  [name] 03/15/2017, 1:46 PM

## 2017-03-15 NOTE — Progress Notes (Signed)
Rehab Admissions Coordinator Note:  Patient was screened by Cleatrice Burke for appropriateness for an Inpatient Acute Rehab Consult per PT and OT recommendations.   At this time, we are recommending await further medical workup completion and progress with therapy before ordering an inpt rehab consult. Noted CCPD.  Cleatrice Burke 03/15/2017, 3:36 PM  I can be reached at [phone].

## 2017-03-15 NOTE — Care Management Note (Signed)
Case Management Note  Patient Details  Name: Jared [name] MRN: [id number] Date of Birth: 1956/04/10  Subjective/Objective:  Pt is a 61 y.o. male admitted to ED on 03/13/17 with AMS, hyperglycemia (>1000), and fevers.  PTA, pt independent, lives at home with sister.   He is on home peritoneal dialysis.                  Action/Plan: PT recommending CIR; OT recommending SNF.   Medical workup in progress.  Pt lethargic today with therapy sessions; hopeful for increased participation with therapy to determine LOC needed at dc.  Will follow.    Expected Discharge Date:                  Expected Discharge Plan:  Start  In-House Referral:     Discharge planning Services  CM Consult  Post Acute Care Choice:    Choice offered to:     DME Arranged:    DME Agency:     HH Arranged:    [facility] Agency:     Status of Service:  In process, will continue to follow  If discussed at Long Length of Stay Meetings, dates discussed:    Additional Comments:  Reinaldo Raddle, RN, BSN  Trauma/Neuro ICU Case Manager [phone]

## 2017-03-15 NOTE — Progress Notes (Signed)
**  Preliminary report by tech**  Carotid artery duplex complete. Findings are consistent with a 1-39 percent stenosis involving the right internal carotid artery and the left internal carotid artery. The vertebral arteries demonstrate antegrade flow.  03/15/17 12:45 PM Jared [name] RVT

## 2017-03-15 NOTE — Progress Notes (Signed)
PROGRESS NOTE    Jared [name]  ZOX:[id number] DOB: [birth year] DOA: 03/13/2017 PCP: Harland Dingwall, NP-C    Brief Narrative:  Patient is a 61 year old gentleman history of end-stage renal disease on peritoneal dialysis, hypertension, insulin-dependent diabetes mellitus, chronic cough, recent initiation of treatment for prostate cancer and prior seizures in the setting of hypoglycemia presented to the ED with altered mental status changes. Patient noted to have fevers as high as 103. Patient also noted to have blood sugars greater than 1000. Neurology and nephrology consulted.   Assessment & Plan:   Principal Problem:   Acute encephalopathy Active Problems:   CKD (chronic kidney disease) stage V requiring chronic dialysis ([place])   Diabetes mellitus due to underlying condition, uncontrolled, without complication, with long-term current use of insulin (HCC)   Prostate cancer (HCC)   Hyperglycemia   Accelerated hypertension  #1 acute encephalopathy/fever Questionable etiology. Patient noted on presentation to have a blood sugar of greater than 1000 and also noted to have high fevers. MAXIMUM TEMPERATURE of 103.Also concerns for seizures Patient was placed on the glucose stabilizer and has been transitioned off to Lantus. Blood sugars have improved from admission and arranging from 63-90. MRI MRA head negative for any acute abnormalities. Patient has been pancultured results pending. Peritoneal fluid has been cultured results pending. Patient still obtunded with spiking fevers. EEG done 03/14/2017 with diffuse slowing of waking background which is nonspecific, absence of epileptiform discharges. LP was attempted by neurology however due to patient's inability to stay still it was aborted. LP attempted under fluoroscopy which had to be aborted as well due to agitation despite patient getting Ativan and Haldol. Continue empiric IV vancomycin and IV Fortaz. Neurology following. ID has been consulted  and recommending CT abdomen and pelvis, IV Diflucan. Place on IV Diflucan. Will order CT abdomen and pelvis with contrast. Discussed with nephrology.  #2 diabetes mellitus uncontrolled and on insulin-dependent diabetes mellitus Blood sugars on admission was 1127. ?? Etiology. Concern for infectious etiology as patient noted to have fevers up to 103. Patient has been pancultured and results pending. Peritoneal fluid has been cultured. Patient was placed on glucose stabilizer and has currently been transitioned to subcutaneous insulin. Patient noted to have a blood glucose of 63 this morning. Decrease Lantus to 5 units twice daily. Place on D5 normal saline at [facility]. Follow CBGs and adjust insulin as needed.   #3 malignant hypertension Patient noted on admission to have systolic blood pressures in the 200s. Patient with no significant improvement with IV Lopressor and IV enalapril. Discontinue IV Lopressor and IV enalapril. Discontinue Nitropaste. Place on the Cardene drip.  #4 end-stage renal disease on peroteneal dialysis Nephrology has been consulted. Per nephrology.  #5 history of prostate cancer On Lupron injections. Outpatient follow-up with urology.  #6 anemia Follow H&H.    DVT prophylaxis: Heparin Code Status: Full Family Communication: Updated sister is at bedside.  Disposition Plan: Pending medical improvement. SNF versus home.   Consultants:   Neurology: Dr.Stewart 03/13/2017  Nephrology 03/14/2017  Infectious diseases: Dr. Johnnye Sima 03/15/2017  Procedures:   MRI MRA head 03/14/2017  CT head 03/13/2017  CXR 03/15/2107  CT abdomen and pelvis pending 03/15/2017  EEG 03/14/2017  Antimicrobials:  IV vancomycin 03/14/2017  IV Fortaz 03/14/2017  IV Diflucan 03/15/2017   Subjective: Patient minimally responsive today. Open his eyes to verbal stimuli however drifts right back to sleep. Lethargic.   Objective: Vitals:   03/15/17 0700 03/15/17 0728 03/15/17 0800  03/15/17 0820  BP: Marland Kitchen)  206/116  (!) 187/112   Pulse: (!) 106  99   Resp: (!) 27  (!) 22   Temp:  (!) 100.5 F (38.1 C)  99.8 F (37.7 C)  TempSrc:  Core (Comment)  Rectal  SpO2: 95%  97%   Weight:      Height:        Intake/Output Summary (Last 24 hours) at 03/15/17 1141 Last data filed at 03/15/17 0900  Gross per 24 hour  Intake             14.5 ml  Output              300 ml  Net           -285.5 ml   Filed Weights   03/14/17 0530 03/15/17 0500  Weight: 77.5 kg (170 lb 14.4 oz) 78.3 kg (172 lb 9.6 oz)    Examination:  General exam: Minimally responsive. Opens eyes to verbal stimuli. Respiratory system: Clear to auscultation anterior lung fields.Marland Kitchen Respiratory effort normal. Cardiovascular system: S1 & S2 heard, Tachycardia. No JVD, murmurs, rubs, gallops or clicks. No pedal edema. Gastrointestinal system: Abdomen is nondistended, soft and nontender. No organomegaly or masses felt. Normal bowel sounds heard. Central nervous system: unresponsive. Obtunded. Extremities: Moving extremities spontaneously.LUE with avf with thrill, no s/sx of infection. Skin: No rashes, lesions or ulcers Psychiatry: Unable to assess.    Data Reviewed: I have personally reviewed following labs and imaging studies  CBC:  Recent Labs Lab 03/13/17 1643 03/14/17 0237 03/15/17 0325  WBC 2.7* 7.0 10.0  NEUTROABS 1.7  --  7.8*  HGB 8.9* 9.9* 9.3*  HCT 25.9* 29.0* 28.0*  MCV 81.2 79.7 80.7  PLT 158 202 456   Basic Metabolic Panel:  Recent Labs Lab 03/13/17 1643 03/14/17 0237 03/15/17 0325  NA 122* 135 134*  K 5.6* 4.6 5.5*  CL 87* 97* 97*  CO2 20* 23 21*  GLUCOSE 1,127* 247* 88  BUN 55* 58* 77*  CREATININE 9.88* 10.89* 12.48*  CALCIUM 8.1* 8.9 8.7*  PHOS  --   --  9.1*   GFR: Estimated Creatinine Clearance: 6.3 mL/min (A) (by C-G formula based on SCr of 12.48 mg/dL (H)). Liver Function Tests:  Recent Labs Lab 03/13/17 1643 03/15/17 0325  AST 23  --   ALT 18  --     ALKPHOS 49  --   BILITOT 0.6  --   PROT 5.8*  --   ALBUMIN 2.7* 2.5*   No results for input(s): LIPASE, AMYLASE in the last 168 hours. No results for input(s): AMMONIA in the last 168 hours. Coagulation Profile: No results for input(s): INR, PROTIME in the last 168 hours. Cardiac Enzymes:  Recent Labs Lab 03/13/17 2349 03/14/17 0237 03/14/17 0825  TROPONINI <0.03 0.04* 0.06*   BNP (last 3 results) No results for input(s): PROBNP in the last 8760 hours. HbA1C:  Recent Labs  03/14/17 0815  HGBA1C 8.5*   CBG:  Recent Labs Lab 03/14/17 2330 03/15/17 0048 03/15/17 0332 03/15/17 0812 03/15/17 0857  GLUCAP 59* 90 84 63* 89   Lipid Profile:  Recent Labs  03/14/17 0237  CHOL 150  HDL 64  LDLCALC 79  TRIG 37  CHOLHDL 2.3   Thyroid Function Tests: No results for input(s): TSH, T4TOTAL, FREET4, T3FREE, THYROIDAB in the last 72 hours. Anemia Panel: No results for input(s): VITAMINB12, FOLATE, FERRITIN, TIBC, IRON, RETICCTPCT in the last 72 hours. Sepsis Labs:  Recent Labs Lab 03/13/17 1649  LATICACIDVEN 1.35  Recent Results (from the past 240 hour(s))  Gram stain     Status: None   Collection Time: 03/13/17  8:15 PM  Result Value Ref Range Status   Specimen Description PERITONEAL  Final   Special Requests NONE  Final   Gram Stain   Final    DIRECT SMEAR NO WBC SEEN NO ORGANISMS SEEN RESULT CALLED TO, READ BACK BY AND VERIFIED WITH: M.PETERS,RN 2200 03/13/17 M.CAMPBELL    Report Status 03/13/2017 FINAL  Final  Culture, body fluid-bottle     Status: None (Preliminary result)   Collection Time: 03/13/17  8:15 PM  Result Value Ref Range Status   Specimen Description FLUID PERITONEAL  Final   Special Requests   Final    BOTTLES DRAWN AEROBIC AND ANAEROBIC Blood Culture adequate volume   Culture NO GROWTH 2 DAYS  Final   Report Status PENDING  Incomplete  MRSA PCR Screening     Status: None   Collection Time: 03/14/17  5:18 AM  Result Value Ref  Range Status   MRSA by PCR NEGATIVE NEGATIVE Final    Comment:        The GeneXpert MRSA Assay (FDA approved for NASAL specimens only), is one component of a comprehensive MRSA colonization surveillance program. It is not intended to diagnose MRSA infection nor to guide or monitor treatment for MRSA infections.   Culture, blood (Routine X 2) w Reflex to ID Panel     Status: None (Preliminary result)   Collection Time: 03/14/17  7:51 AM  Result Value Ref Range Status   Specimen Description BLOOD RIGHT HAND  Final   Special Requests   Final    BOTTLES DRAWN AEROBIC ONLY Blood Culture adequate volume   Culture NO GROWTH 1 DAY  Final   Report Status PENDING  Incomplete  Culture, blood (Routine X 2) w Reflex to ID Panel     Status: None (Preliminary result)   Collection Time: 03/14/17  8:14 AM  Result Value Ref Range Status   Specimen Description BLOOD RIGHT ASSIST CONTROL  Final   Special Requests IN PEDIATRIC BOTTLE Blood Culture adequate volume  Final   Culture NO GROWTH 1 DAY  Final   Report Status PENDING  Incomplete  Urine culture     Status: None   Collection Time: 03/14/17  9:20 AM  Result Value Ref Range Status   Specimen Description URINE, RANDOM  Final   Special Requests NONE  Final   Culture NO GROWTH  Final   Report Status 03/15/2017 FINAL  Final         Radiology Studies: Ct Head Wo Contrast  Result Date: 03/13/2017 CLINICAL DATA:  Altered mental status with right-sided facial droop and confusion EXAM: CT HEAD WITHOUT CONTRAST TECHNIQUE: Contiguous axial images were obtained from the base of the skull through the vertex without intravenous contrast. COMPARISON:  MRI 11/27/2016, CT brain 11/24/2016 FINDINGS: Brain: No acute territorial infarction, hemorrhage, or focal mass lesion is visualized. There is mild to moderate atrophy. The ventricles are nonenlarged. Vascular: No hyperdense vessels. Scattered calcifications at the carotid siphons. Mild vertebral artery  calcification Skull: No fracture. Stable ground-glass slightly expansile lesion in the anterior medial wall of the left maxillary sinus. Sinuses/Orbits: Mild mucosal thickening in the ethmoid sinuses and maxillary sinuses. No acute orbital abnormality. Other: None IMPRESSION: No definite CT evidence for acute intracranial abnormality. Electronically Signed   By: Donavan Foil M.D.   On: 03/13/2017 20:51   Mr Jodene Nam Head Wo Contrast  Result Date: 03/14/2017 CLINICAL DATA:  61 year old male with end-stage renal disease on peritoneal dialysis. Recent initiation of treatment for prostate cancer. Mental status changes, inappropriate speech, left hand tremor. EXAM: MRI HEAD WITHOUT CONTRAST MRA HEAD WITHOUT CONTRAST TECHNIQUE: Multiplanar, multiecho pulse sequences of the brain and surrounding structures were obtained without intravenous contrast. Angiographic images of the head were obtained using MRA technique without contrast. COMPARISON:  Head CT without contrast 03/13/2017. Brain MRI 11/27/2016 FINDINGS: MRI HEAD FINDINGS Study is mildly degraded by motion artifact despite repeated imaging attempts. Brain: No restricted diffusion to suggest acute infarction. No midline shift, mass effect, evidence of mass lesion, ventriculomegaly, extra-axial collection or acute intracranial hemorrhage. Cervicomedullary junction and pituitary are within normal limits. Pearline Cables and white matter signal appears stable and normal for age throughout the brain. No encephalomalacia or chronic cerebral blood products identified. Vascular: Major intracranial vascular flow voids are stable with mild intracranial artery tortuosity. Skull and upper cervical spine: Visible bone marrow signal is stable and normal. Negative visible cervical spine. Sinuses/Orbits: Stable and negative. Other: Stable posterior left scalp lipoma (series 5, image 4). Otherwise negative scalp soft tissues. Visible internal auditory structures appear normal. Mild bilateral  mastoid effusions seen in January have largely resolved. MRA HEAD FINDINGS Antegrade flow in the posterior circulation with codominant distal vertebral arteries. No distal vertebral stenosis. No basilar artery stenosis. AICA, SCA and PCA origins appear normal. Bilateral PCA branches are within normal limits. Posterior communicating arteries are diminutive or absent. Antegrade flow in both ICA siphons. Tortuous cavernous segments. No ICA siphon stenosis. Ophthalmic artery origins appear to remain patent. Patent carotid termini. MCA and ACA origins are normal. Tortuous A1 segments, more so the left. Diminutive anterior communicating artery. Visible ACA branches are normal aside from mild tortuosity. Normal MCA M1 segments. Bilateral MCA bifurcations and visible bilateral MCA branches are within normal limits. IMPRESSION: 1. No acute intracranial abnormality. Stable since January and negative noncontrast MRI appearance of the brain. 2.  Negative intracranial MRA. Electronically Signed   By: Genevie Ann M.D.   On: 03/14/2017 11:27   Mr Brain Wo Contrast  Result Date: 03/14/2017 CLINICAL DATA:  61 year old male with end-stage renal disease on peritoneal dialysis. Recent initiation of treatment for prostate cancer. Mental status changes, inappropriate speech, left hand tremor. EXAM: MRI HEAD WITHOUT CONTRAST MRA HEAD WITHOUT CONTRAST TECHNIQUE: Multiplanar, multiecho pulse sequences of the brain and surrounding structures were obtained without intravenous contrast. Angiographic images of the head were obtained using MRA technique without contrast. COMPARISON:  Head CT without contrast 03/13/2017. Brain MRI 11/27/2016 FINDINGS: MRI HEAD FINDINGS Study is mildly degraded by motion artifact despite repeated imaging attempts. Brain: No restricted diffusion to suggest acute infarction. No midline shift, mass effect, evidence of mass lesion, ventriculomegaly, extra-axial collection or acute intracranial hemorrhage.  Cervicomedullary junction and pituitary are within normal limits. Pearline Cables and white matter signal appears stable and normal for age throughout the brain. No encephalomalacia or chronic cerebral blood products identified. Vascular: Major intracranial vascular flow voids are stable with mild intracranial artery tortuosity. Skull and upper cervical spine: Visible bone marrow signal is stable and normal. Negative visible cervical spine. Sinuses/Orbits: Stable and negative. Other: Stable posterior left scalp lipoma (series 5, image 4). Otherwise negative scalp soft tissues. Visible internal auditory structures appear normal. Mild bilateral mastoid effusions seen in January have largely resolved. MRA HEAD FINDINGS Antegrade flow in the posterior circulation with codominant distal vertebral arteries. No distal vertebral stenosis. No basilar artery stenosis. AICA, SCA and  PCA origins appear normal. Bilateral PCA branches are within normal limits. Posterior communicating arteries are diminutive or absent. Antegrade flow in both ICA siphons. Tortuous cavernous segments. No ICA siphon stenosis. Ophthalmic artery origins appear to remain patent. Patent carotid termini. MCA and ACA origins are normal. Tortuous A1 segments, more so the left. Diminutive anterior communicating artery. Visible ACA branches are normal aside from mild tortuosity. Normal MCA M1 segments. Bilateral MCA bifurcations and visible bilateral MCA branches are within normal limits. IMPRESSION: 1. No acute intracranial abnormality. Stable since January and negative noncontrast MRI appearance of the brain. 2.  Negative intracranial MRA. Electronically Signed   By: Genevie Ann M.D.   On: 03/14/2017 11:27   Dg Chest Port 1 View  Result Date: 03/14/2017 CLINICAL DATA:  Fever, history of diabetes, chronic renal insufficiency, seizure disorder, former smoker. EXAM: PORTABLE CHEST 1 VIEW COMPARISON:  Portable chest x-ray of March 13, 2017 FINDINGS: The lungs are mildly  hypoinflated but clear. The cardiac silhouette is mildly enlarged. The pulmonary vascularity is normal. The mediastinum is normal in width. There is no pleural effusion. The bony thorax is unremarkable. IMPRESSION: Stable appearance of the chest. Mild cardiomegaly in which is accentuated by hypoinflation. No pneumonia or pulmonary edema. Electronically Signed   By: David  Martinique M.D.   On: 03/14/2017 08:49   Dg Chest Port 1 View  Result Date: 03/13/2017 CLINICAL DATA:  Patient was found wandering outside. Last seen normal at 1400 hours today. Right-sided facial droop. Confusion and weakness. History of diabetes. EXAM: PORTABLE CHEST 1 VIEW COMPARISON:  11/26/2016 FINDINGS: Shallow inspiration. Normal heart size and pulmonary vascularity. No focal airspace disease or consolidation in the lungs. No blunting of costophrenic angles. No pneumothorax. Mediastinal contours appear intact. IMPRESSION: No active disease. Electronically Signed   By: Lucienne Capers M.D.   On: 03/13/2017 22:11        Scheduled Meds: . chlorhexidine  15 mL Mouth Rinse BID  . [START ON 03/19/2017] darbepoetin (ARANESP) injection - NON-DIALYSIS  150 mcg Subcutaneous Q Mon-1800  . gentamicin cream  1 application Topical Daily  . heparin subcutaneous  5,000 Units Subcutaneous Q8H  . insulin aspart  0-9 Units Subcutaneous TID WC  . [START ON 03/16/2017] insulin glargine  5 Units Subcutaneous BID  . mouth rinse  15 mL Mouth Rinse q12n4p  . sodium chloride flush  3 mL Intravenous Q12H   Continuous Infusions: . cefTAZidime (FORTAZ)  IV    . dextrose 5 % and 0.9% NaCl 10 mL/hr at 03/15/17 0842  . dialysis solution 2.5% low-MG/low-CA    . dialysis solution 2.5% low-MG/low-CA    . dialysis solution 2.5% low-MG/low-CA    . niCARDipine 3 mg/hr (03/15/17 0837)     LOS: 2 days    Time spent: 65 mins    Yamir Carignan, MD [facility] Pager [phone]  If 7PM-7AM, please contact  night-coverage [email or web address] Password [facility] 03/15/2017, 11:41 AM

## 2017-03-15 NOTE — Progress Notes (Signed)
Lamar Blinks, NP paged regarding patients elevated BP. Still maintaining 200s/100s. Additional dose of Hydralazine 10 mg given. Drip not ordered at this time, providers aware of patient not responding to current BP medications. Manual BP taken and still elevated to 195/90. Will continue to monitor patient.

## 2017-03-15 NOTE — Evaluation (Signed)
Occupational Therapy Evaluation Patient Details Name: Elihu Milstein MRN: [id number] DOB: [birth year] Today's Date: 03/15/2017    History of Present Illness Pt is a 61 y.o. male admitted to ED on 03/13/17 with AMS, hyperglycemia (>1000), and fevers. CT and MRI negative for acute infarct; additional workup is pending. EEG 4/18 abnormal with diffuse slowing of waking background. Pertinent PMH includes ESRD (on home dialysis), DM, seizures, HTN, chronic cough, prostate CA.    Clinical Impression   Pt admitted with above. He demonstrates the below listed deficits and will benefit from continued OT to maximize safety and independence with BADLs.  Pt minimally responsive - opens eyes briefly and demonstrates Lt gaze preference.  He follows no commands.  He did utter "no" when therapists moved him to sitting.  He requires total A for all ADLs and functional mobility.  He lived with sister and was independent PTA.  Anticipate he will require SNF at discharge.       Follow Up Recommendations  SNF;Supervision/Assistance - 24 hour    Equipment Recommendations  None recommended by OT    Recommendations for Other Services       Precautions / Restrictions Precautions Precautions: Fall Restrictions Weight Bearing Restrictions: No      Mobility Bed Mobility Overal bed mobility: Needs Assistance Bed Mobility: Supine to Sit;Sit to Supine     Supine to sit: +2 for physical assistance;Total assist;HOB elevated Sit to supine: Total assist;+2 for physical assistance   General bed mobility comments: TotalA (+2) to sit EOB; pt actively resisting movement throughout.   Transfers                 General transfer comment: unable     Balance Overall balance assessment: Needs assistance Sitting-balance support: No upper extremity supported;Feet supported Sitting balance-Leahy Scale: Poor Sitting balance - Comments: Pt initially maxA to maintain balance, progressing to min guard for sitting  balance with forward flexed posture >5 min. With increased fatigue, required totalA for sitting with significant R lateral lean. Postural control: Right lateral lean     Standing balance comment: Not attempted                           ADL either performed or assessed with clinical judgement   ADL Overall ADL's : Needs assistance/impaired Eating/Feeding: NPO   Grooming: Wash/dry hands;Wash/dry face;Oral care;Brushing hair;Total assistance;Sitting;Bed level   Upper Body Bathing: Total assistance;Bed level   Lower Body Bathing: Total assistance;Bed level   Upper Body Dressing : Total assistance;Bed level   Lower Body Dressing: Total assistance;Bed level   Toilet Transfer: Total assistance Toilet Transfer Details (indicate cue type and reason): unable  Toileting- Clothing Manipulation and Hygiene: Total assistance;Bed level       Functional mobility during ADLs: Total assistance;+2 for physical assistance General ADL Comments: Pt unable to engage in ADL activity      Vision Baseline Vision/History: No visual deficits Additional Comments: Pt keeps eyes closed majority of time.  He does open them occasionally.  Demonstrates Lt gaze preference      Perception Perception Perception Tested?: Yes Perception Deficits: Inattention/neglect Inattention/Neglect: Does not attend to right visual field;Does not attend to right side of body Spatial deficits: Lt gaze preference    Praxis Praxis Praxis-Other Comments: pt follows no commands and does not engage in activity     Pertinent Vitals/Pain Pain Assessment: Faces Faces Pain Scale: Hurts a little bit Pain Location: unable to provide info.  Grimaces  when moved  Pain Descriptors / Indicators: Discomfort Pain Intervention(s): Limited activity within patient's tolerance     Hand Dominance Right   Extremity/Trunk Assessment Upper Extremity Assessment Upper Extremity Assessment: RUE deficits/detail RUE Deficits /  Details: Rt UE appears flaccid RUE Coordination: decreased fine motor;decreased gross motor   Lower Extremity Assessment Lower Extremity Assessment: Defer to PT evaluation RLE Deficits / Details: No active movement in RLE; no response to painful stimuli in BLE.  RLE Coordination: decreased gross motor;decreased fine motor LLE Deficits / Details: No active movement in RLE; no response to painful stimuli in BLE.  LLE Coordination: decreased gross motor;decreased fine motor   Cervical / Trunk Assessment Cervical / Trunk Assessment: Other exceptions Cervical / Trunk Exceptions: poor trunk control    Communication Communication Communication: Receptive difficulties;Expressive difficulties   Cognition Arousal/Alertness: Lethargic Behavior During Therapy: Flat affect Overall Cognitive Status: Impaired/Different from baseline Area of Impairment: Attention                   Current Attention Level: Focused           General Comments: Pt lethargic but open eyes intermittently to stimulation, especially tactile stimulation to L-side. Only verbalization was to say "no" when therapists began bed mobility. Did not follow any commands. Did not respond to painful stimuli; no purposeful movement on RUE/RLE. Made spontaneous attempts with LUE to remove gown and glove.   General Comments  VSS - sisters present     Exercises     Shoulder Instructions      Home Living Family/patient expects to be discharged to:: Private residence Living Arrangements: Other relatives (sisters ) Available Help at Discharge: Family;Available 24 hours/day Type of Home: House Home Access: Level entry     Home Layout: Two level;Bed/bath upstairs Alternate Level Stairs-Number of Steps: 1 flight Alternate Level Stairs-Rails: Left;Right Bathroom Shower/Tub: Occupational psychologist: Standard Bathroom Accessibility: Yes How Accessible: Accessible via wheelchair;Accessible via walker Home  Equipment: None      Lives With: Family    Prior Functioning/Environment Level of Independence: Independent        Comments: Works periodically transporting cars; 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         OT Problem List: Decreased strength;Decreased range of motion;Decreased activity tolerance;Impaired balance (sitting and/or standing);Impaired vision/perception;Decreased coordination;Decreased cognition;Decreased safety awareness;Decreased knowledge of use of DME or AE;Impaired sensation;Impaired tone;Impaired UE functional use      OT Treatment/Interventions: Self-care/ADL training;Neuromuscular education;DME and/or AE instruction;Therapeutic activities;Cognitive remediation/compensation;Visual/perceptual remediation/compensation;Patient/family education;Balance training    OT Goals(Current goals can be found in the care plan section) Acute Rehab OT Goals Patient Stated Goal: Unable to state OT Goal Formulation: Patient unable to participate in goal setting Time For Goal Achievement: 03/29/17 Potential to Achieve Goals: Fair ADL Goals Pt Will Perform Grooming: with mod assist;sitting Additional ADL Goal #1: Pt will sit EOB x 5 mins with mod A in prep for ADLs Additional ADL Goal #2: Pt will sustain attention to familiar task x 3 mins with min cues  Additional ADL Goal #3: Pt will follow one step commands during ADLs 50% of time   OT Frequency: Min 2X/week   Barriers to D/C: Decreased caregiver support          Co-evaluation PT/OT/SLP Co-Evaluation/Treatment: Yes Reason for Co-Treatment: Complexity of the patient's impairments (multi-system involvement);For patient/therapist safety;Necessary to address cognition/behavior during functional activity   OT goals addressed during session: ADL's and self-care SLP goals addressed during session: Swallowing;Cognition;Communication  End of Session Nurse Communication: Mobility status  Activity Tolerance: Patient limited by  lethargy Patient left: in bed;with call bell/phone within reach;with bed alarm set;with family/visitor present  OT Visit Diagnosis: Cognitive communication deficit (O15.615)                Time: 3794-3276 OT Time Calculation (min): 29 min Charges:  OT General Charges $OT Visit: 1 Procedure OT Evaluation $OT Eval Moderate Complexity: 1 Procedure G-Codes:     Lucille Passy, OTR/L [phone]   Lucille Passy M 03/15/2017, 2:14 PM

## 2017-03-15 NOTE — Progress Notes (Signed)
Inpatient Diabetes Program Recommendations  AACE/ADA: New Consensus Statement on Inpatient Glycemic Control (2015)  Target Ranges:  Prepandial:   less than 140 mg/dL      Peak postprandial:   less than 180 mg/dL (1-2 hours)      Critically ill patients:  140 - 180 mg/dL   Lab Results  Component Value Date   GLUCAP 89 03/15/2017   HGBA1C 8.5 (H) 03/14/2017    Review of Glycemic ControlResults for DASHAWN, BARTNICK (MRN [id number]) as of 03/15/2017 11:02  Ref. Range 03/14/2017 23:30 03/15/2017 00:48 03/15/2017 03:32 03/15/2017 08:12 03/15/2017 08:57  Glucose-Capillary Latest Ref Range: 65 - 99 mg/dL 59 (L) 90 84 63 (L) 89   Inpatient Diabetes Program Recommendations:   Note blood sugars<100 mg/dL.  Consider reducing Lantus to 5 units bid.  Text page sent to MD.   Thanks, Adah Perl, RN, BC-ADM Inpatient Diabetes Coordinator Pager [phone] (8a-5p)

## 2017-03-15 NOTE — Progress Notes (Signed)
Hypoglycemic Event  CBG: 63  Treatment: D50 IV 25 mL  Symptoms: None  Follow-up CBG: Time:0900 CBG Result:89  Possible Reasons for Event: Inadequate meal intake  Comments/MD notified: orders received     Earma Reading

## 2017-03-15 NOTE — Evaluation (Signed)
Clinical/Bedside Swallow Evaluation Patient Details  Name: Jared [name] MRN: [id number] Date of Birth: 10/21/1956  Today's Date: 03/15/2017 Time: SLP Start Time (ACUTE ONLY): 1119 SLP Stop Time (ACUTE ONLY): 1152 SLP Time Calculation (min) (ACUTE ONLY): 33 min  Past Medical History:  Past Medical History:  Diagnosis Date  . Anemia   . Chronic kidney disease    on dialysis M-W-F  . Diabetes mellitus without complication ([place])    onset as adult  . Hypertension   . Seizures ([place])   . Stroke ([place])   . Thyroid disease    hyperparathyroidism   Past Surgical History:  Past Surgical History:  Procedure Laterality Date  . AV FISTULA PLACEMENT Left 06/04/2014   Procedure: ARTERIOVENOUS (AV) FISTULA CREATION-  BRACHIOCEPHALIC WITH LIGATION OF COMPETEING BRANCH;  Surgeon: Mal Misty, MD;  Location: [place];  Service: Vascular;  Laterality: Left;  . INSERTION OF DIALYSIS CATHETER  04/2014  . IR GENERIC HISTORICAL Left 11/30/2016   IR THROMBECTOMY AV FISTULA W/THROMBOLYSIS/PTA/STENT INC/SHUNT/IMG LT 11/30/2016 Markus Daft, MD [facility]-INTERV RAD  . IR GENERIC HISTORICAL  11/30/2016   IR US GUIDE VASC ACCESS LEFT 11/30/2016 Markus Daft, MD [facility]   HPI:  61 year old male who presents with AMS, hyperglycemia (>1000), fevers. MRI was negative for acute infarct but additional w/u is pending. PMH incldues ESRD, diabetes, seizures, HTN, chronic cough, prostate cancer    Assessment / Plan / Recommendation Clinical Impression  Pt intermittently opens his eyes, but has no oral movements or attempts at bolus acceptance when provided oral care or trials of ice chips/water. His mentation is a barrier to safe PO intake at this time. Recommend to remain NPO pending improved alertness and awareness. SLP Visit Diagnosis: Dysphagia, unspecified (R13.10)    Aspiration Risk  Severe aspiration risk    Diet Recommendation NPO   Medication Administration: Via alternative means    Other  Recommendations Oral Care  Recommendations: Oral care QID Other Recommendations: Have oral suction available   Follow up Recommendations  (tba)      Frequency and Duration min 2x/week  2 weeks       Prognosis Prognosis for Safe Diet Advancement: Good Barriers to Reach Goals: Cognitive deficits      Swallow Study   General HPI: 61 year old male who presents with AMS, hyperglycemia (>1000), fevers. MRI was negative for acute infarct but additional w/u is pending. PMH incldues ESRD, diabetes, seizures, HTN, chronic cough, prostate cancer  Type of Study: Bedside Swallow Evaluation Previous Swallow Assessment: none in chart Diet Prior to this Study: NPO Temperature Spikes Noted: Yes (103) Respiratory Status: Room air History of Recent Intubation: No Behavior/Cognition: Lethargic/Drowsy Oral Cavity Assessment: Excessive secretions Oral Care Completed by SLP: Yes Oral Cavity - Dentition:  (difficult to visualize) Self-Feeding Abilities: Total assist Patient Positioning: Other (comment) (EOB) Baseline Vocal Quality: Not observed Volitional Cough: Cognitively unable to elicit Volitional Swallow: Unable to elicit    Oral/Motor/Sensory Function     Ice Chips Ice chips: Impaired Presentation: Spoon Oral Phase Impairments: Poor awareness of bolus;Other (comment) (no oral movements or acceptance)   Thin Liquid Thin Liquid: Not tested    Nectar Thick Nectar Thick Liquid: Not tested   Honey Thick Honey Thick Liquid: Not tested   Puree Puree: Not tested   Solid   GO   Solid: Not tested        [name] 03/15/2017,12:25 PM  [name], M.A. CCC-SLP [phone]

## 2017-03-15 NOTE — Evaluation (Signed)
Speech Language Pathology Evaluation Patient Details Name: Jared [name] MRN: [id number] DOB: 1956-07-16 Today's Date: 03/15/2017 Time: 4193-7902 SLP Time Calculation (min) (ACUTE ONLY): 33 min  Problem List:  Patient Active Problem List   Diagnosis Date Noted  . Acute encephalopathy 03/13/2017  . Prostate cancer ([place]) 03/13/2017  . Hyperglycemia 03/13/2017  . Accelerated hypertension 03/13/2017  . Elevated LDL cholesterol level 01/24/2017  . Diabetes mellitus due to underlying condition, uncontrolled, without complication, with long-term current use of insulin ([place]) 01/04/2017  . ESRD on dialysis ([place])   . Anemia of chronic disease   . Abdominal pain 12/08/2016  . Enlarged prostate 12/08/2016  . 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 Hypertension   . CKD (chronic kidney disease) stage V requiring chronic dialysis ([place])   . Acute respiratory failure with hypoxia ([place])   . Seizure ([place]) 11/24/2016  . Status epilepticus [facility])    Past Medical History:  Past Medical History:  Diagnosis Date  . Anemia   . Chronic kidney disease    on dialysis M-W-F  . Diabetes mellitus without complication ([place])    onset as adult  . Hypertension   . Seizures ([place])   . Stroke ([place])   . Thyroid disease    hyperparathyroidism   Past Surgical History:  Past Surgical History:  Procedure Laterality Date  . AV FISTULA PLACEMENT Left 06/04/2014   Procedure: ARTERIOVENOUS (AV) FISTULA CREATION-  BRACHIOCEPHALIC WITH LIGATION OF COMPETEING BRANCH;  Surgeon: Mal Misty, MD;  Location: [place];  Service: Vascular;  Laterality: Left;  . INSERTION OF DIALYSIS CATHETER  04/2014  . IR GENERIC HISTORICAL Left 11/30/2016   IR THROMBECTOMY AV FISTULA W/THROMBOLYSIS/PTA/STENT INC/SHUNT/IMG LT 11/30/2016 Markus Daft, MD [facility]  . IR GENERIC HISTORICAL  11/30/2016   IR US GUIDE VASC ACCESS LEFT 11/30/2016 Markus Daft, MD [facility]   HPI:  61 year old male who presents with AMS, hyperglycemia (>1000), fevers. MRI was  negative for acute infarct but additional w/u is pending. PMH incldues ESRD, diabetes, seizures, HTN, chronic cough, prostate cancer    Assessment / Plan / Recommendation Clinical Impression  Pt is lethargic but intermittently opens his eyes to stimulation, particularly to tactile stimulation on his left side. He has a left-sided gaze preference. Pt said "no" x1 when therapists assisted him to the EOB, otherwise without verbalizations or command following observed. He did not respond to painful stimuli, but when alert he did make spontaneous attempts to remove his gown and glove from his left side. Pt will benefit from additional SLP f/u to maximize functional cognition and communication in pt who was independent PTA.    SLP Assessment  SLP Recommendation/Assessment: Patient needs continued Speech Lanaguage Pathology Services SLP Visit Diagnosis: Cognitive communication deficit (R41.841)    Follow Up Recommendations   (tba)    Frequency and Duration min 2x/week  2 weeks      SLP Evaluation Cognition  Overall Cognitive Status: Impaired/Different from baseline Arousal/Alertness: Lethargic Attention: Focused Focused Attention: Impaired Focused Attention Impairment: Verbal basic;Functional basic Awareness: Impaired Awareness Impairment: Emergent impairment Problem Solving: Impaired Problem Solving Impairment: Functional basic Behaviors: Restless Safety/Judgment: Impaired       Comprehension  Auditory Comprehension Overall Auditory Comprehension: Impaired Commands: Impaired One Step Basic Commands: 0-24% accurate    Expression Expression Primary Mode of Expression: Verbal Verbal Expression Overall Verbal Expression: Impaired (pt said "no" when trying to get up, no other verbalizations) Initiation: Impaired Non-Verbal Means of Communication: Not applicable   Oral / Motor  Motor Speech Overall  Motor Speech: Other (comment) (UTA)   GO                    [name] 03/15/2017, 12:32 PM  [name], M.A. CCC-SLP [phone]

## 2017-03-15 NOTE — Progress Notes (Signed)
[facility] Progress Note   Subjective: was more awake this am, opened eyes and made eye contact briefly  Vitals:   03/15/17 0700 03/15/17 0728 03/15/17 0800 03/15/17 0820  BP: (!) 206/116  (!) 187/112   Pulse: (!) 106  99   Resp: (!) 27  (!) 22   Temp:  (!) 100.5 F (38.1 C)  99.8 F (37.7 C)  TempSrc:  Core (Comment)  Rectal  SpO2: 95%  97%   Weight:      Height:        Inpatient medications: . chlorhexidine  15 mL Mouth Rinse BID  . gentamicin cream  1 application Topical Daily  . heparin subcutaneous  5,000 Units Subcutaneous Q8H  . insulin aspart  0-9 Units Subcutaneous TID WC  . [START ON 03/16/2017] insulin glargine  5 Units Subcutaneous BID  . mouth rinse  15 mL Mouth Rinse q12n4p  . sodium chloride flush  3 mL Intravenous Q12H   . cefTAZidime (FORTAZ)  IV    . dextrose 5 % and 0.9% NaCl 10 mL/hr at 03/15/17 0842  . dialysis solution 2.5% low-MG/low-CA    . dialysis solution 2.5% low-MG/low-CA    . dialysis solution 2.5% low-MG/low-CA    . niCARDipine 3 mg/hr (03/15/17 0837)   acetaminophen **OR** acetaminophen, dextrose, heparin, hydrALAZINE, ondansetron **OR** ondansetron (ZOFRAN) IV  Exam: Remains stuporous to obtunded, did nod his head when asked "are you Jared [name]?" and is making some eye contact this morning Shivering, contracted , lying on his side Chest clear bilat RRR tachy no RG Abd soft ntnd PD cath w clean exit site Ext L upper arm AV fistula with +bruit, no lesions/ ulcers/ drainage or erythema No open wounds on back/ legs or arms Neuro poorly responsive, moves all ext  Dialysis: [facility]  6 exchanges/ 24 hrs, 2500 overnight and 2000 day dwell.  Dwell time 1h 20 min. Dry wt 75.5kg.  Mircera 200 ug given 3/28, next dose 50ug sched for 4/25, Venofer 250 mg scheduled for 4/25       Assessment: 1. Fevers/ AMS - unclear cause, blood cx's neg to date.  Not peritonitis (low TNC = 3, clear fluid) and AV fistula L arm not infected.   Would look for other causes.  On empiric abx.  ID seeing today.  2. AMS - seen by neuro 3. ESRD - resuming [facility] today, will do 1 manual exchange at noon then put on cycler tonight 4. Volume - is up 2-3kg by wt 5. HTN - uncontrolled, started on Cardene drip this am, would shoot for syst BP's of 130- 160 6. Hx prostate Ca - on Lupron 7. DM - per primary 8. Anemia of CKD - Hb 8-9.5, last mircera was 3/28. Hold IV fe with active infection. Give darbe next week on Monday 150 ug.  9. MBD - holding po meds for now  Plan - as above   Kelly Splinter MD [place] pager [phone]   03/15/2017, 11:27 AM    Recent Labs Lab 03/13/17 1643 03/14/17 0237 03/15/17 0325  NA 122* 135 134*  K 5.6* 4.6 5.5*  CL 87* 97* 97*  CO2 20* 23 21*  GLUCOSE 1,127* 247* 88  BUN 55* 58* 77*  CREATININE 9.88* 10.89* 12.48*  CALCIUM 8.1* 8.9 8.7*  PHOS  --   --  9.1*    Recent Labs Lab 03/13/17 1643 03/15/17 0325  AST 23  --   ALT 18  --   ALKPHOS 49  --  BILITOT 0.6  --   PROT 5.8*  --   ALBUMIN 2.7* 2.5*    Recent Labs Lab 03/13/17 1643 03/14/17 0237 03/15/17 0325  WBC 2.7* 7.0 10.0  NEUTROABS 1.7  --  7.8*  HGB 8.9* 9.9* 9.3*  HCT 25.9* 29.0* 28.0*  MCV 81.2 79.7 80.7  PLT 158 202 196   Iron/TIBC/Ferritin/ %Sat    Component Value Date/Time   IRON 78 11/28/2016 1546   TIBC 162 (L) 11/28/2016 1546   IRONPCTSAT 48 (H) 11/28/2016 1546

## 2017-03-15 NOTE — Consult Note (Signed)
[name]  Date of Admission:  03/13/2017  Date of Consult:  03/15/2017  Reason for Consult: fever Referring Physician: Thompson  Impression/Recommendation Fever  Peritoneal Dialysis   Would check CT abd/pelvis Would add diflucan LP when possible.  PD fluid shows only 3 wbc, peritonitis seems unlikley Await his BCx and UCx (-) Ceftaz has good CNS penetration. Would continue this and vanco.  Consider eval of prev HD AVG.   Thank you so much for this interesting consult,   Bobby Rumpf (pager) [phone] [email or web address]  Jared [name] is an 61 y.o. male.  HPI: 61 yo M with hx of ESRD, peritoneal dialysis (PD), DM, prostate CA, comes to hospital on 4-17 with mental status change (word finding problems, disorientation). In ED he had urinary incontinence. He was hypertensive 200/120 and afebrile.  In ED he was found to have hyponatremia 122 (corrected 142.5), WBC 2.7. His GLc was 1127.  Neuro was called as he had a facial droop on adm. CT no acute change. MRI/A no acute change.  In hospital he developed fever to 103, was started on vanco/ceftaz.    Past Medical History:  Diagnosis Date  . Anemia   . Chronic kidney disease    on dialysis M-W-F  . Diabetes mellitus without complication ([place])    onset as adult  . Hypertension   . Seizures ([place])   . Stroke ([place])   . Thyroid disease    hyperparathyroidism    Past Surgical History:  Procedure Laterality Date  . AV FISTULA PLACEMENT Left 06/04/2014   Procedure: ARTERIOVENOUS (AV) FISTULA CREATION-  BRACHIOCEPHALIC WITH LIGATION OF COMPETEING BRANCH;  Surgeon: Mal Misty, MD;  Location: [facility];  Service: Vascular;  Laterality: Left;  . INSERTION OF DIALYSIS CATHETER  04/2014  . IR GENERIC HISTORICAL Left 11/30/2016   IR THROMBECTOMY AV FISTULA W/THROMBOLYSIS/PTA/STENT INC/SHUNT/IMG LT 11/30/2016 Markus Daft, MD [facility]  . IR GENERIC HISTORICAL  11/30/2016   IR US GUIDE VASC  ACCESS LEFT 11/30/2016 Markus Daft, MD [facility]     Allergies  Allergen Reactions  . Penicillins Other (See Comments)    Has patient had a PCN reaction causing immediate rash, facial/tongue/throat swelling, SOB or lightheadedness with hypotension: Unk Has patient had a PCN reaction causing severe rash involving mucus membranes or skin necrosis: Unk Has patient had a PCN reaction that required hospitalization: Unk Has patient had a PCN reaction occurring within the last 10 years: Unk If all of the above answers are "NO", then may proceed with Cephalosporin use.     Medications:  Scheduled: . chlorhexidine  15 mL Mouth Rinse BID  . gentamicin cream  1 application Topical Daily  . heparin subcutaneous  5,000 Units Subcutaneous Q8H  . insulin aspart  0-9 Units Subcutaneous TID WC  . insulin glargine  10 Units Subcutaneous BID  . mouth rinse  15 mL Mouth Rinse q12n4p  . sodium chloride flush  3 mL Intravenous Q12H    Abtx:  Anti-infectives    Start     Dose/Rate Route Frequency Ordered Stop   03/15/17 1800  cefTAZidime (FORTAZ) 500 mg in dextrose 5 % 50 mL IVPB     500 mg 100 mL/hr over 30 Minutes Intravenous Every 24 hours 03/14/17 1344     03/14/17 1330  cefTRIAXone (ROCEPHIN) 2 g in dextrose 5 % 50 mL IVPB  Status:  Discontinued     2 g 100 mL/hr over 30 Minutes Intravenous Every 12 hours  03/14/17 1325 03/14/17 1436   03/14/17 1300  vancomycin (VANCOCIN) 2,000 mg in sodium chloride 0.9 % 500 mL IVPB     2,000 mg 250 mL/hr over 120 Minutes Intravenous  Once 03/14/17 1231 03/14/17 1531   03/14/17 1300  cefTAZidime (FORTAZ) 1 g in dextrose 5 % 50 mL IVPB     1 g 100 mL/hr over 30 Minutes Intravenous  Once 03/14/17 1231 03/14/17 1408      Total days of antibiotics: 1 vanco/ceftaz          Social History:  reports that he quit smoking about 32 years ago. He has never used smokeless tobacco. He reports that he drinks about 0.6 oz of alcohol per week . He reports that he does  not use drugs.  Family History  Problem Relation Age of Onset  . Cancer - Lung Mother   . Cancer - Other Father     General ROS: per family no headaches, no change in BM, makes normal urine, . unobtainable pt non-arousable.   Blood pressure (!) 187/112, pulse 99, temperature 99.8 F (37.7 C), temperature source Rectal, resp. rate (!) 22, height _0  (1.778 m), weight 78.3 kg (172 lb 9.6 oz), SpO2 97 %. General appearance: syndromic appearance - unresponsive. does make purposeful movements.  Eyes: pupils =, no photophobia.  Neck: supple, symmetrical, trachea midline and no resistance to movement, no meningismus.  Lungs: clear to auscultation bilaterally Heart: regular rate and rhythm Abdomen: normal findings: bowel sounds normal and soft, non-tender and PD cath site is clean. non-tender, no d/c.Marland Kitchen  Extremities: edema none   Results for orders placed or performed during the hospital encounter of 03/13/17 (from the past 48 hour(s))  CBG monitoring, ED     Status: Abnormal   Collection Time: 03/13/17  4:20 PM  Result Value Ref Range   Glucose-Capillary >600 (HH) 65 - 99 mg/dL  Comprehensive metabolic panel     Status: Abnormal   Collection Time: 03/13/17  4:43 PM  Result Value Ref Range   Sodium 122 (L) 135 - 145 mmol/L   Potassium 5.6 (H) 3.5 - 5.1 mmol/L   Chloride 87 (L) 101 - 111 mmol/L   CO2 20 (L) 22 - 32 mmol/L   Glucose, Bld 1,127 (HH) 65 - 99 mg/dL    Comment: CRITICAL RESULT CALLED TO, READ BACK BY AND VERIFIED WITH: M PETERS,RN 1753 03/13/17 D BRADLEY QUESTIONABLE RESULTS, RECOMMEND RECOLLECT TO VERIFY    BUN 55 (H) 6 - 20 mg/dL   Creatinine, Ser 9.88 (H) 0.61 - 1.24 mg/dL   Calcium 8.1 (L) 8.9 - 10.3 mg/dL   Total Protein 5.8 (L) 6.5 - 8.1 g/dL   Albumin 2.7 (L) 3.5 - 5.0 g/dL   AST 23 15 - 41 U/L   ALT 18 17 - 63 U/L   Alkaline Phosphatase 49 38 - 126 U/L   Total Bilirubin 0.6 0.3 - 1.2 mg/dL   GFR calc non Af Amer 5 (L) >60 mL/min   GFR calc Af Amer 6 (L) >60  mL/min    Comment: (NOTE) The eGFR has been calculated using the CKD EPI equation. This calculation has not been validated in all clinical situations. eGFR's persistently <60 mL/min signify possible Chronic Kidney Disease.    Anion gap 15 5 - 15  CBC with Differential     Status: Abnormal   Collection Time: 03/13/17  4:43 PM  Result Value Ref Range   WBC 2.7 (L) 4.0 - 10.5 K/uL  RBC 3.19 (L) 4.22 - 5.81 MIL/uL   Hemoglobin 8.9 (L) 13.0 - 17.0 g/dL   HCT 25.9 (L) 39.0 - 52.0 %   MCV 81.2 78.0 - 100.0 fL   MCH 27.9 26.0 - 34.0 pg   MCHC 34.4 30.0 - 36.0 g/dL   RDW 15.9 (H) 11.5 - 15.5 %   Platelets 158 150 - 400 K/uL   Neutrophils Relative % 61 %   Neutro Abs 1.7 1.7 - 7.7 K/uL   Lymphocytes Relative 30 %   Lymphs Abs 0.8 0.7 - 4.0 K/uL   Monocytes Relative 6 %   Monocytes Absolute 0.2 0.1 - 1.0 K/uL   Eosinophils Relative 2 %   Eosinophils Absolute 0.1 0.0 - 0.7 K/uL   Basophils Relative 1 %   Basophils Absolute 0.0 0.0 - 0.1 K/uL  Ethanol     Status: None   Collection Time: 03/13/17  4:43 PM  Result Value Ref Range   Alcohol, Ethyl (B) <5 <5 mg/dL    Comment:        LOWEST DETECTABLE LIMIT FOR SERUM ALCOHOL IS 5 mg/dL FOR MEDICAL PURPOSES ONLY   I-stat troponin, ED     Status: None   Collection Time: 03/13/17  4:47 PM  Result Value Ref Range   Troponin i, poc 0.00 0.00 - 0.08 ng/mL   Comment 3            Comment: Due to the release kinetics of cTnI, a negative result within the first hours of the onset of symptoms does not rule out myocardial infarction with certainty. If myocardial infarction is still suspected, repeat the test at appropriate intervals.   I-Stat CG4 Lactic Acid, ED     Status: None   Collection Time: 03/13/17  4:49 PM  Result Value Ref Range   Lactic Acid, Venous 1.35 0.5 - 1.9 mmol/L  Body fluid cell count with differential     Status: Abnormal   Collection Time: 03/13/17  8:15 PM  Result Value Ref Range   Fluid Type-FCT Peritoneal     Color, Fluid COLORLESS (A) YELLOW   Appearance, Fluid CLEAR CLEAR   WBC, Fluid 3 0 - 1,000 cu mm   Other Cells, Fluid TOO FEW TO COUNT, SMEAR AVAILABLE FOR REVIEW %    Comment: FEW LYMPHOCYTES  Gram stain     Status: None   Collection Time: 03/13/17  8:15 PM  Result Value Ref Range   Specimen Description PERITONEAL    Special Requests NONE    Gram Stain      DIRECT SMEAR NO WBC SEEN NO ORGANISMS SEEN RESULT CALLED TO, READ BACK BY AND VERIFIED WITH: M.PETERS,RN 2200 03/13/17 M.CAMPBELL    Report Status 03/13/2017 FINAL   Culture, body fluid-bottle     Status: None (Preliminary result)   Collection Time: 03/13/17  8:15 PM  Result Value Ref Range   Specimen Description FLUID PERITONEAL    Special Requests      BOTTLES DRAWN AEROBIC AND ANAEROBIC Blood Culture adequate volume   Culture NO GROWTH < 24 HOURS    Report Status PENDING   Pathologist smear review     Status: None   Collection Time: 03/13/17  8:15 PM  Result Value Ref Range   Path Review            Comment: REACTIVE MESOTHELIAL CELLS AND MACROPHAGES PRESENT, NO ATYPIA SEEN. Reviewed by Kalman Drape, M.D. 03/14/2017   CBG monitoring, ED     Status: Abnormal  Collection Time: 03/13/17  8:38 PM  Result Value Ref Range   Glucose-Capillary >600 (HH) 65 - 99 mg/dL  CBG monitoring, ED     Status: Abnormal   Collection Time: 03/13/17 10:05 PM  Result Value Ref Range   Glucose-Capillary 581 (HH) 65 - 99 mg/dL  CBG monitoring, ED     Status: Abnormal   Collection Time: 03/13/17 11:13 PM  Result Value Ref Range   Glucose-Capillary 493 (H) 65 - 99 mg/dL  Troponin I     Status: None   Collection Time: 03/13/17 11:49 PM  Result Value Ref Range   Troponin I <0.03 <0.03 ng/mL  CBG monitoring, ED     Status: Abnormal   Collection Time: 03/14/17 12:17 AM  Result Value Ref Range   Glucose-Capillary 363 (H) 65 - 99 mg/dL  CBG monitoring, ED     Status: Abnormal   Collection Time: 03/14/17  1:18 AM  Result Value Ref Range    Glucose-Capillary 276 (H) 65 - 99 mg/dL  CBG monitoring, ED     Status: Abnormal   Collection Time: 03/14/17  2:20 AM  Result Value Ref Range   Glucose-Capillary 223 (H) 65 - 99 mg/dL  CBC     Status: Abnormal   Collection Time: 03/14/17  2:37 AM  Result Value Ref Range   WBC 7.0 4.0 - 10.5 K/uL   RBC 3.64 (L) 4.22 - 5.81 MIL/uL   Hemoglobin 9.9 (L) 13.0 - 17.0 g/dL   HCT 29.0 (L) 39.0 - 52.0 %   MCV 79.7 78.0 - 100.0 fL   MCH 27.2 26.0 - 34.0 pg   MCHC 34.1 30.0 - 36.0 g/dL   RDW 15.8 (H) 11.5 - 15.5 %   Platelets 202 150 - 400 K/uL  Troponin I     Status: Abnormal   Collection Time: 03/14/17  2:37 AM  Result Value Ref Range   Troponin I 0.04 (HH) <0.03 ng/mL    Comment: CRITICAL RESULT CALLED TO, READ BACK BY AND VERIFIED WITH: [name] K,RN 03/14/17 0340 WAYK   Lipid panel     Status: None   Collection Time: 03/14/17  2:37 AM  Result Value Ref Range   Cholesterol 150 0 - 200 mg/dL   Triglycerides 37 <150 mg/dL   HDL 64 >40 mg/dL   Total CHOL/HDL Ratio 2.3 RATIO   VLDL 7 0 - 40 mg/dL   LDL Cholesterol 79 0 - 99 mg/dL    Comment:        Total Cholesterol/HDL:CHD Risk Coronary Heart Disease Risk Table                     Men   Women  1/2 Average Risk   3.4   3.3  Average Risk       5.0   4.4  2 X Average Risk   9.6   7.1  3 X Average Risk  23.4   11.0        Use the calculated Patient Ratio above and the CHD Risk Table to determine the patient's CHD Risk.        ATP III CLASSIFICATION (LDL):  <100     mg/dL   Optimal  100-129  mg/dL   Near or Above                    Optimal  130-159  mg/dL   Borderline  160-189  mg/dL   High  >190  mg/dL   Very High   Basic metabolic panel     Status: Abnormal   Collection Time: 03/14/17  2:37 AM  Result Value Ref Range   Sodium 135 135 - 145 mmol/L    Comment: DELTA CHECK NOTED   Potassium 4.6 3.5 - 5.1 mmol/L    Comment: DELTA CHECK NOTED   Chloride 97 (L) 101 - 111 mmol/L   CO2 23 22 - 32 mmol/L   Glucose, Bld 247  (H) 65 - 99 mg/dL   BUN 58 (H) 6 - 20 mg/dL   Creatinine, Ser 10.89 (H) 0.61 - 1.24 mg/dL   Calcium 8.9 8.9 - 10.3 mg/dL   GFR calc non Af Amer 4 (L) >60 mL/min   GFR calc Af Amer 5 (L) >60 mL/min    Comment: (NOTE) The eGFR has been calculated using the CKD EPI equation. This calculation has not been validated in all clinical situations. eGFR's persistently <60 mL/min signify possible Chronic Kidney Disease.    Anion gap 15 5 - 15  CBG monitoring, ED     Status: Abnormal   Collection Time: 03/14/17  3:22 AM  Result Value Ref Range   Glucose-Capillary 196 (H) 65 - 99 mg/dL  MRSA PCR Screening     Status: None   Collection Time: 03/14/17  5:18 AM  Result Value Ref Range   MRSA by PCR NEGATIVE NEGATIVE    Comment:        The GeneXpert MRSA Assay (FDA approved for NASAL specimens only), is one component of a comprehensive MRSA colonization surveillance program. It is not intended to diagnose MRSA infection nor to guide or monitor treatment for MRSA infections.   Glucose, capillary     Status: Abnormal   Collection Time: 03/14/17  6:11 AM  Result Value Ref Range   Glucose-Capillary 297 (H) 65 - 99 mg/dL  Hemoglobin A1c     Status: Abnormal   Collection Time: 03/14/17  8:15 AM  Result Value Ref Range   Hgb A1c MFr Bld 8.5 (H) 4.8 - 5.6 %    Comment: (NOTE)         Pre-diabetes: 5.7 - 6.4         Diabetes: >6.4         Glycemic control for adults with diabetes: <7.0    Mean Plasma Glucose 197 mg/dL    Comment: (NOTE) Performed At: [facility] [place], [place] [id number] Lindon Romp MD [id number]   Troponin I     Status: Abnormal   Collection Time: 03/14/17  8:25 AM  Result Value Ref Range   Troponin I 0.06 (HH) <0.03 ng/mL    Comment: CRITICAL VALUE NOTED.  VALUE IS CONSISTENT WITH PREVIOUSLY REPORTED AND CALLED VALUE.  Glucose, capillary     Status: Abnormal   Collection Time: 03/14/17  9:03 AM  Result Value Ref Range    Glucose-Capillary 396 (H) 65 - 99 mg/dL  Urinalysis, Routine w reflex microscopic     Status: Abnormal   Collection Time: 03/14/17  9:20 AM  Result Value Ref Range   Color, Urine YELLOW YELLOW   APPearance CLOUDY (A) CLEAR   Specific Gravity, Urine 1.010 1.005 - 1.030   pH 8.0 5.0 - 8.0   Glucose, UA >=500 (A) NEGATIVE mg/dL   Hgb urine dipstick SMALL (A) NEGATIVE   Bilirubin Urine NEGATIVE NEGATIVE   Ketones, ur 5 (A) NEGATIVE mg/dL   Protein, ur >=300 (A) NEGATIVE mg/dL   Nitrite NEGATIVE  NEGATIVE   Leukocytes, UA NEGATIVE NEGATIVE   RBC / HPF TOO NUMEROUS TO COUNT 0 - 5 RBC/hpf   WBC, UA 0-5 0 - 5 WBC/hpf   Bacteria, UA RARE (A) NONE SEEN   Squamous Epithelial / LPF 0-5 (A) NONE SEEN  Urine rapid drug screen (hosp performed)     Status: Abnormal   Collection Time: 03/14/17  9:20 AM  Result Value Ref Range   Opiates NONE DETECTED NONE DETECTED   Cocaine NONE DETECTED NONE DETECTED   Benzodiazepines POSITIVE (A) NONE DETECTED   Amphetamines NONE DETECTED NONE DETECTED   Tetrahydrocannabinol NONE DETECTED NONE DETECTED   Barbiturates NONE DETECTED NONE DETECTED    Comment:        DRUG SCREEN FOR MEDICAL PURPOSES ONLY.  IF CONFIRMATION IS NEEDED FOR ANY PURPOSE, NOTIFY LAB WITHIN 5 DAYS.        LOWEST DETECTABLE LIMITS FOR URINE DRUG SCREEN Drug Class       Cutoff (ng/mL) Amphetamine      1000 Barbiturate      200 Benzodiazepine   119 Tricyclics       147 Opiates          300 Cocaine          300 THC              50   Urine culture     Status: None   Collection Time: 03/14/17  9:20 AM  Result Value Ref Range   Specimen Description URINE, RANDOM    Special Requests NONE    Culture NO GROWTH    Report Status 03/15/2017 FINAL   Glucose, capillary     Status: Abnormal   Collection Time: 03/14/17 11:29 AM  Result Value Ref Range   Glucose-Capillary 261 (H) 65 - 99 mg/dL   Comment 1 Notify RN    Comment 2 Document in Chart   Glucose, capillary     Status: None    Collection Time: 03/14/17  4:25 PM  Result Value Ref Range   Glucose-Capillary 75 65 - 99 mg/dL  Glucose, capillary     Status: None   Collection Time: 03/14/17  4:33 PM  Result Value Ref Range   Glucose-Capillary 75 65 - 99 mg/dL  Glucose, capillary     Status: None   Collection Time: 03/14/17  7:56 PM  Result Value Ref Range   Glucose-Capillary 83 65 - 99 mg/dL  Glucose, capillary     Status: Abnormal   Collection Time: 03/14/17 11:30 PM  Result Value Ref Range   Glucose-Capillary 59 (L) 65 - 99 mg/dL  Glucose, capillary     Status: None   Collection Time: 03/15/17 12:48 AM  Result Value Ref Range   Glucose-Capillary 90 65 - 99 mg/dL  Renal function panel     Status: Abnormal   Collection Time: 03/15/17  3:25 AM  Result Value Ref Range   Sodium 134 (L) 135 - 145 mmol/L   Potassium 5.5 (H) 3.5 - 5.1 mmol/L   Chloride 97 (L) 101 - 111 mmol/L   CO2 21 (L) 22 - 32 mmol/L   Glucose, Bld 88 65 - 99 mg/dL   BUN 77 (H) 6 - 20 mg/dL   Creatinine, Ser 12.48 (H) 0.61 - 1.24 mg/dL   Calcium 8.7 (L) 8.9 - 10.3 mg/dL   Phosphorus 9.1 (H) 2.5 - 4.6 mg/dL   Albumin 2.5 (L) 3.5 - 5.0 g/dL   GFR calc non Af Amer 4 (L) >60  mL/min   GFR calc Af Amer 4 (L) >60 mL/min    Comment: (NOTE) The eGFR has been calculated using the CKD EPI equation. This calculation has not been validated in all clinical situations. eGFR's persistently <60 mL/min signify possible Chronic Kidney Disease.    Anion gap 16 (H) 5 - 15  CBC with Differential/Platelet     Status: Abnormal   Collection Time: 03/15/17  3:25 AM  Result Value Ref Range   WBC 10.0 4.0 - 10.5 K/uL   RBC 3.47 (L) 4.22 - 5.81 MIL/uL   Hemoglobin 9.3 (L) 13.0 - 17.0 g/dL   HCT 28.0 (L) 39.0 - 52.0 %   MCV 80.7 78.0 - 100.0 fL   MCH 26.8 26.0 - 34.0 pg   MCHC 33.2 30.0 - 36.0 g/dL   RDW 16.2 (H) 11.5 - 15.5 %   Platelets 196 150 - 400 K/uL   Neutrophils Relative % 78 %   Neutro Abs 7.8 (H) 1.7 - 7.7 K/uL   Lymphocytes Relative 17 %    Lymphs Abs 1.7 0.7 - 4.0 K/uL   Monocytes Relative 5 %   Monocytes Absolute 0.5 0.1 - 1.0 K/uL   Eosinophils Relative 0 %   Eosinophils Absolute 0.0 0.0 - 0.7 K/uL   Basophils Relative 0 %   Basophils Absolute 0.0 0.0 - 0.1 K/uL  Glucose, capillary     Status: None   Collection Time: 03/15/17  3:32 AM  Result Value Ref Range   Glucose-Capillary 84 65 - 99 mg/dL  Glucose, capillary     Status: Abnormal   Collection Time: 03/15/17  8:12 AM  Result Value Ref Range   Glucose-Capillary 63 (L) 65 - 99 mg/dL  Glucose, capillary     Status: None   Collection Time: 03/15/17  8:57 AM  Result Value Ref Range   Glucose-Capillary 89 65 - 99 mg/dL      Component Value Date/Time   SDES URINE, RANDOM 03/14/2017 0920   SPECREQUEST NONE 03/14/2017 0920   CULT NO GROWTH 03/14/2017 0920   REPTSTATUS 03/15/2017 FINAL 03/14/2017 0920   Ct Head Wo Contrast  Result Date: 03/13/2017 CLINICAL DATA:  Altered mental status with right-sided facial droop and confusion EXAM: CT HEAD WITHOUT CONTRAST TECHNIQUE: Contiguous axial images were obtained from the base of the skull through the vertex without intravenous contrast. COMPARISON:  MRI 11/27/2016, CT brain 11/24/2016 FINDINGS: Brain: No acute territorial infarction, hemorrhage, or focal mass lesion is visualized. There is mild to moderate atrophy. The ventricles are nonenlarged. Vascular: No hyperdense vessels. Scattered calcifications at the carotid siphons. Mild vertebral artery calcification Skull: No fracture. Stable ground-glass slightly expansile lesion in the anterior medial wall of the left maxillary sinus. Sinuses/Orbits: Mild mucosal thickening in the ethmoid sinuses and maxillary sinuses. No acute orbital abnormality. Other: None IMPRESSION: No definite CT evidence for acute intracranial abnormality. Electronically Signed   By: Donavan Foil M.D.   On: 03/13/2017 20:51   Mr Jodene Nam Head Wo Contrast  Result Date: 03/14/2017 CLINICAL DATA:  61 year old  male with end-stage renal disease on peritoneal dialysis. Recent initiation of treatment for prostate cancer. Mental status changes, inappropriate speech, left hand tremor. EXAM: MRI HEAD WITHOUT CONTRAST MRA HEAD WITHOUT CONTRAST TECHNIQUE: Multiplanar, multiecho pulse sequences of the brain and surrounding structures were obtained without intravenous contrast. Angiographic images of the head were obtained using MRA technique without contrast. COMPARISON:  Head CT without contrast 03/13/2017. Brain MRI 11/27/2016 FINDINGS: MRI HEAD FINDINGS Study is mildly degraded by  motion artifact despite repeated imaging attempts. Brain: No restricted diffusion to suggest acute infarction. No midline shift, mass effect, evidence of mass lesion, ventriculomegaly, extra-axial collection or acute intracranial hemorrhage. Cervicomedullary junction and pituitary are within normal limits. [place] Cables and white matter signal appears stable and normal for age throughout the brain. No encephalomalacia or chronic cerebral blood products identified. Vascular: Major intracranial vascular flow voids are stable with mild intracranial artery tortuosity. Skull and upper cervical spine: Visible bone marrow signal is stable and normal. Negative visible cervical spine. Sinuses/Orbits: Stable and negative. Other: Stable posterior left scalp lipoma (series 5, image 4). Otherwise negative scalp soft tissues. Visible internal auditory structures appear normal. Mild bilateral mastoid effusions seen in January have largely resolved. MRA HEAD FINDINGS Antegrade flow in the posterior circulation with codominant distal vertebral arteries. No distal vertebral stenosis. No basilar artery stenosis. AICA, SCA and PCA origins appear normal. Bilateral PCA branches are within normal limits. Posterior communicating arteries are diminutive or absent. Antegrade flow in both ICA siphons. Tortuous cavernous segments. No ICA siphon stenosis. Ophthalmic artery origins appear  to remain patent. Patent carotid termini. MCA and ACA origins are normal. Tortuous A1 segments, more so the left. Diminutive anterior communicating artery. Visible ACA branches are normal aside from mild tortuosity. Normal MCA M1 segments. Bilateral MCA bifurcations and visible bilateral MCA branches are within normal limits. IMPRESSION: 1. No acute intracranial abnormality. Stable since January and negative noncontrast MRI appearance of the brain. 2.  Negative intracranial MRA. Electronically Signed   By: Genevie Ann M.D.   On: 03/14/2017 11:27   Mr Brain Wo Contrast  Result Date: 03/14/2017 CLINICAL DATA:  61 year old male with end-stage renal disease on peritoneal dialysis. Recent initiation of treatment for prostate cancer. Mental status changes, inappropriate speech, left hand tremor. EXAM: MRI HEAD WITHOUT CONTRAST MRA HEAD WITHOUT CONTRAST TECHNIQUE: Multiplanar, multiecho pulse sequences of the brain and surrounding structures were obtained without intravenous contrast. Angiographic images of the head were obtained using MRA technique without contrast. COMPARISON:  Head CT without contrast 03/13/2017. Brain MRI 11/27/2016 FINDINGS: MRI HEAD FINDINGS Study is mildly degraded by motion artifact despite repeated imaging attempts. Brain: No restricted diffusion to suggest acute infarction. No midline shift, mass effect, evidence of mass lesion, ventriculomegaly, extra-axial collection or acute intracranial hemorrhage. Cervicomedullary junction and pituitary are within normal limits. [place] Cables and white matter signal appears stable and normal for age throughout the brain. No encephalomalacia or chronic cerebral blood products identified. Vascular: Major intracranial vascular flow voids are stable with mild intracranial artery tortuosity. Skull and upper cervical spine: Visible bone marrow signal is stable and normal. Negative visible cervical spine. Sinuses/Orbits: Stable and negative. Other: Stable posterior left  scalp lipoma (series 5, image 4). Otherwise negative scalp soft tissues. Visible internal auditory structures appear normal. Mild bilateral mastoid effusions seen in January have largely resolved. MRA HEAD FINDINGS Antegrade flow in the posterior circulation with codominant distal vertebral arteries. No distal vertebral stenosis. No basilar artery stenosis. AICA, SCA and PCA origins appear normal. Bilateral PCA branches are within normal limits. Posterior communicating arteries are diminutive or absent. Antegrade flow in both ICA siphons. Tortuous cavernous segments. No ICA siphon stenosis. Ophthalmic artery origins appear to remain patent. Patent carotid termini. MCA and ACA origins are normal. Tortuous A1 segments, more so the left. Diminutive anterior communicating artery. Visible ACA branches are normal aside from mild tortuosity. Normal MCA M1 segments. Bilateral MCA bifurcations and visible bilateral MCA branches are within normal limits. IMPRESSION: 1. No  acute intracranial abnormality. Stable since January and negative noncontrast MRI appearance of the brain. 2.  Negative intracranial MRA. Electronically Signed   By: Genevie Ann M.D.   On: 03/14/2017 11:27   Dg Chest Port 1 View  Result Date: 03/14/2017 CLINICAL DATA:  Fever, history of diabetes, chronic renal insufficiency, seizure disorder, former smoker. EXAM: PORTABLE CHEST 1 VIEW COMPARISON:  Portable chest x-ray of March 13, 2017 FINDINGS: The lungs are mildly hypoinflated but clear. The cardiac silhouette is mildly enlarged. The pulmonary vascularity is normal. The mediastinum is normal in width. There is no pleural effusion. The bony thorax is unremarkable. IMPRESSION: Stable appearance of the chest. Mild cardiomegaly in which is accentuated by hypoinflation. No pneumonia or pulmonary edema. Electronically Signed   By: David  Martinique M.D.   On: 03/14/2017 08:49   Dg Chest Port 1 View  Result Date: 03/13/2017 CLINICAL DATA:  Patient was found  wandering outside. Last seen normal at 1400 hours today. Right-sided facial droop. Confusion and weakness. History of diabetes. EXAM: PORTABLE CHEST 1 VIEW COMPARISON:  11/26/2016 FINDINGS: Shallow inspiration. Normal heart size and pulmonary vascularity. No focal airspace disease or consolidation in the lungs. No blunting of costophrenic angles. No pneumothorax. Mediastinal contours appear intact. IMPRESSION: No active disease. Electronically Signed   By: Lucienne Capers M.D.   On: 03/13/2017 22:11   Recent Results (from the past 240 hour(s))  Gram stain     Status: None   Collection Time: 03/13/17  8:15 PM  Result Value Ref Range Status   Specimen Description PERITONEAL  Final   Special Requests NONE  Final   Gram Stain   Final    DIRECT SMEAR NO WBC SEEN NO ORGANISMS SEEN RESULT CALLED TO, READ BACK BY AND VERIFIED WITH: M.PETERS,RN 2200 03/13/17 M.CAMPBELL    Report Status 03/13/2017 FINAL  Final  Culture, body fluid-bottle     Status: None (Preliminary result)   Collection Time: 03/13/17  8:15 PM  Result Value Ref Range Status   Specimen Description FLUID PERITONEAL  Final   Special Requests   Final    BOTTLES DRAWN AEROBIC AND ANAEROBIC Blood Culture adequate volume   Culture NO GROWTH < 24 HOURS  Final   Report Status PENDING  Incomplete  MRSA PCR Screening     Status: None   Collection Time: 03/14/17  5:18 AM  Result Value Ref Range Status   MRSA by PCR NEGATIVE NEGATIVE Final    Comment:        The GeneXpert MRSA Assay (FDA approved for NASAL specimens only), is one component of a comprehensive MRSA colonization surveillance program. It is not intended to diagnose MRSA infection nor to guide or monitor treatment for MRSA infections.   Urine culture     Status: None   Collection Time: 03/14/17  9:20 AM  Result Value Ref Range Status   Specimen Description URINE, RANDOM  Final   Special Requests NONE  Final   Culture NO GROWTH  Final   Report Status 03/15/2017  FINAL  Final      03/15/2017, 10:19 AM     LOS: 2 days    Records and images were personally reviewed where available.

## 2017-03-16 DIAGNOSIS — R569 Unspecified convulsions: Secondary | ICD-10-CM

## 2017-03-16 LAB — RENAL FUNCTION PANEL
Albumin: 2.3 g/dL — ABNORMAL LOW (ref 3.5–5.0)
Anion gap: 18 — ABNORMAL HIGH (ref 5–15)
BUN: 75 mg/dL — AB (ref 6–20)
CALCIUM: 8.3 mg/dL — AB (ref 8.9–10.3)
CO2: 19 mmol/L — ABNORMAL LOW (ref 22–32)
CREATININE: 12.21 mg/dL — AB (ref 0.61–1.24)
Chloride: 96 mmol/L — ABNORMAL LOW (ref 101–111)
GFR calc Af Amer: 4 mL/min — ABNORMAL LOW (ref >60)
GFR, EST NON AFRICAN AMERICAN: 4 mL/min — AB (ref >60)
GLUCOSE: 322 mg/dL — AB (ref 65–99)
PHOSPHORUS: 7.6 mg/dL — AB (ref 2.5–4.6)
Potassium: 4.6 mmol/L (ref 3.5–5.1)
SODIUM: 133 mmol/L — AB (ref 135–145)

## 2017-03-16 LAB — BASIC METABOLIC PANEL
ANION GAP: 16 — AB (ref 5–15)
BUN: 68 mg/dL — ABNORMAL HIGH (ref 6–20)
CO2: 22 mmol/L (ref 22–32)
CREATININE: 12.14 mg/dL — AB (ref 0.61–1.24)
Calcium: 8.2 mg/dL — ABNORMAL LOW (ref 8.9–10.3)
Chloride: 100 mmol/L — ABNORMAL LOW (ref 101–111)
GFR calc Af Amer: 4 mL/min — ABNORMAL LOW (ref >60)
GFR, EST NON AFRICAN AMERICAN: 4 mL/min — AB (ref >60)
Glucose, Bld: 105 mg/dL — ABNORMAL HIGH (ref 65–99)
Potassium: 4.2 mmol/L (ref 3.5–5.1)
SODIUM: 138 mmol/L (ref 135–145)

## 2017-03-16 LAB — CBC WITH DIFFERENTIAL/PLATELET
BASOS ABS: 0 10*3/uL (ref 0.0–0.1)
BASOS ABS: 0 10*3/uL (ref 0.0–0.1)
BASOS PCT: 0 %
BASOS PCT: 0 %
EOS ABS: 0 10*3/uL (ref 0.0–0.7)
EOS ABS: 0.1 10*3/uL (ref 0.0–0.7)
EOS PCT: 0 %
EOS PCT: 1 %
HCT: 28.1 % — ABNORMAL LOW (ref 39.0–52.0)
HCT: 26.5 % — ABNORMAL LOW (ref 39.0–52.0)
HEMOGLOBIN: 9.1 g/dL — AB (ref 13.0–17.0)
Hemoglobin: 9.3 g/dL — ABNORMAL LOW (ref 13.0–17.0)
Lymphocytes Relative: 15 %
Lymphocytes Relative: 14 %
Lymphs Abs: 1.4 10*3/uL (ref 0.7–4.0)
Lymphs Abs: 0.9 10*3/uL (ref 0.7–4.0)
MCH: 26.7 pg (ref 26.0–34.0)
MCH: 27.7 pg (ref 26.0–34.0)
MCHC: 33.1 g/dL (ref 30.0–36.0)
MCHC: 34.3 g/dL (ref 30.0–36.0)
MCV: 80.7 fL (ref 78.0–100.0)
MCV: 80.8 fL (ref 78.0–100.0)
Monocytes Absolute: 0.7 10*3/uL (ref 0.1–1.0)
Monocytes Absolute: 0.6 10*3/uL (ref 0.1–1.0)
Monocytes Relative: 8 %
Monocytes Relative: 10 %
NEUTROS PCT: 75 %
Neutro Abs: 6.7 10*3/uL (ref 1.7–7.7)
Neutro Abs: 5.1 10*3/uL (ref 1.7–7.7)
Neutrophils Relative %: 77 %
PLATELETS: 199 10*3/uL (ref 150–400)
PLATELETS: 172 10*3/uL (ref 150–400)
RBC: 3.48 MIL/uL — AB (ref 4.22–5.81)
RBC: 3.28 MIL/uL — ABNORMAL LOW (ref 4.22–5.81)
RDW: 16.1 % — AB (ref 11.5–15.5)
RDW: 16.4 % — ABNORMAL HIGH (ref 11.5–15.5)
WBC: 8.8 10*3/uL (ref 4.0–10.5)
WBC: 6.7 10*3/uL (ref 4.0–10.5)

## 2017-03-16 LAB — GLUCOSE, CAPILLARY
GLUCOSE-CAPILLARY: 175 mg/dL — AB (ref 65–99)
GLUCOSE-CAPILLARY: 192 mg/dL — AB (ref 65–99)
GLUCOSE-CAPILLARY: 223 mg/dL — AB (ref 65–99)
GLUCOSE-CAPILLARY: 333 mg/dL — AB (ref 65–99)
GLUCOSE-CAPILLARY: 337 mg/dL — AB (ref 65–99)
Glucose-Capillary: 98 mg/dL (ref 65–99)

## 2017-03-16 LAB — MAGNESIUM: MAGNESIUM: 1.5 mg/dL — AB (ref 1.7–2.4)

## 2017-03-16 MED ORDER — DELFLEX-LC/1.5% DEXTROSE 344 MOSM/L IP SOLN: INTRAPERITONEAL | Status: DC

## 2017-03-16 MED ORDER — GENTAMICIN SULFATE 0.1 % EX CREA
1.0000 "application " | TOPICAL_CREAM | Freq: Every day | CUTANEOUS | Status: DC
  Filled 2017-03-16: qty 15

## 2017-03-16 MED ORDER — LORAZEPAM 2 MG/ML IJ SOLN
1.0000 mg | INTRAMUSCULAR | Status: DC | PRN
  Administered 2017-03-17: 2 mg via INTRAVENOUS
  Filled 2017-03-16: qty 1

## 2017-03-16 MED ORDER — LORAZEPAM 2 MG/ML IJ SOLN
INTRAMUSCULAR | Status: AC
  Administered 2017-03-16: 2 mg via INTRAVENOUS
  Filled 2017-03-16: qty 1

## 2017-03-16 MED ORDER — INSULIN ASPART 100 UNIT/ML ~~LOC~~ SOLN
3.0000 [IU] | Freq: Once | SUBCUTANEOUS | Status: AC
  Administered 2017-03-16: 3 [IU] via SUBCUTANEOUS

## 2017-03-16 MED ORDER — HYDRALAZINE HCL 20 MG/ML IJ SOLN: 5.0000 mg | Freq: Once | INTRAMUSCULAR | Status: DC

## 2017-03-16 MED ORDER — HEPARIN 1000 UNIT/ML FOR PERITONEAL DIALYSIS
INTRAPERITONEAL | Status: DC | PRN
  Filled 2017-03-16: qty 5000

## 2017-03-16 MED ORDER — DELFLEX-LC/2.5% DEXTROSE 394 MOSM/L IP SOLN: INTRAPERITONEAL | Status: DC

## 2017-03-16 MED ORDER — SODIUM CHLORIDE 0.9 % IV SOLN
250.0000 mg | INTRAVENOUS | Status: DC
  Administered 2017-03-16 – 2017-03-20 (×5): 250 mg via INTRAVENOUS
  Filled 2017-03-16 (×5): qty 2.5

## 2017-03-16 MED ORDER — HEPARIN 1000 UNIT/ML FOR PERITONEAL DIALYSIS
2500.0000 [IU] | INTRAMUSCULAR | Status: DC | PRN
  Filled 2017-03-16: qty 2.5

## 2017-03-16 MED ORDER — LORAZEPAM 2 MG/ML IJ SOLN
2.0000 mg | Freq: Once | INTRAMUSCULAR | Status: AC
  Administered 2017-03-16: 2 mg via INTRAVENOUS

## 2017-03-16 MED ORDER — METOPROLOL TARTRATE 5 MG/5ML IV SOLN
7.5000 mg | Freq: Three times a day (TID) | INTRAVENOUS | Status: DC
  Administered 2017-03-16 – 2017-03-17 (×3): 7.5 mg via INTRAVENOUS
  Filled 2017-03-16 (×3): qty 10

## 2017-03-16 MED ORDER — INSULIN GLARGINE 100 UNIT/ML ~~LOC~~ SOLN
8.0000 [IU] | Freq: Two times a day (BID) | SUBCUTANEOUS | Status: DC
  Administered 2017-03-16 – 2017-03-19 (×8): 8 [IU] via SUBCUTANEOUS
  Filled 2017-03-16 (×10): qty 0.08

## 2017-03-16 NOTE — Progress Notes (Signed)
Pt had second episode seizure-like activity w/ this RN at bedside. Seizure-like activity lasted 30sec. Notified Dr. Shon Hale, verbal order for 2mg  ativan. Pt protecting airway, VSS, family at bedside.

## 2017-03-16 NOTE — Progress Notes (Signed)
INFECTIOUS DISEASE PROGRESS NOTE  ID: Jared [name] is a 61 y.o. male with  Principal Problem:   Acute encephalopathy Active Problems:   CKD (chronic kidney disease) stage V requiring chronic dialysis (HCC)   Diabetes mellitus due to underlying condition, uncontrolled, without complication, with long-term current use of insulin (HCC)   Prostate cancer (HCC)   Hyperglycemia   Accelerated hypertension   FUO (fever of unknown origin)   Weakness  Subjective: Awakens but does not vocalize or respond.   Abtx:  Anti-infectives    Start     Dose/Rate Route Frequency Ordered Stop   03/16/17 1000  fluconazole (DIFLUCAN) IVPB 100 mg     100 mg 50 mL/hr over 60 Minutes Intravenous Every 24 hours 03/15/17 1143     03/15/17 1800  cefTAZidime (FORTAZ) 500 mg in dextrose 5 % 50 mL IVPB     500 mg 100 mL/hr over 30 Minutes Intravenous Every 24 hours 03/14/17 1344     03/15/17 1230  fluconazole (DIFLUCAN) IVPB 200 mg     200 mg 100 mL/hr over 60 Minutes Intravenous  Once 03/15/17 1143 03/15/17 1333   03/14/17 1330  cefTRIAXone (ROCEPHIN) 2 g in dextrose 5 % 50 mL IVPB  Status:  Discontinued     2 g 100 mL/hr over 30 Minutes Intravenous Every 12 hours 03/14/17 1325 03/14/17 1436   03/14/17 1300  vancomycin (VANCOCIN) 2,000 mg in sodium chloride 0.9 % 500 mL IVPB     2,000 mg 250 mL/hr over 120 Minutes Intravenous  Once 03/14/17 1231 03/14/17 1531   03/14/17 1300  cefTAZidime (FORTAZ) 1 g in dextrose 5 % 50 mL IVPB     1 g 100 mL/hr over 30 Minutes Intravenous  Once 03/14/17 1231 03/14/17 1408      Medications:  Scheduled: . chlorhexidine  15 mL Mouth Rinse BID  . [START ON 03/19/2017] darbepoetin (ARANESP) injection - NON-DIALYSIS  150 mcg Subcutaneous Q Mon-1800  . fluconazole (DIFLUCAN) IV  100 mg Intravenous Q24H  . gentamicin cream  1 application Topical Daily  . heparin subcutaneous  5,000 Units Subcutaneous Q8H  . insulin aspart  0-9 Units Subcutaneous TID WC  . insulin  glargine  8 Units Subcutaneous BID  . mouth rinse  15 mL Mouth Rinse q12n4p  . metoprolol  7.5 mg Intravenous Q8H  . sodium chloride flush  3 mL Intravenous Q12H    Objective: Vital signs in last 24 hours: Temp:  [97.9 F (36.6 C)-100.3 F (37.9 C)] 98.3 F (36.8 C) (04/20 1104) Pulse Rate:  [92-120] 106 (04/20 1315) Resp:  [5-34] 24 (04/20 1315) BP: (119-228)/(56-119) 143/92 (04/20 1315) SpO2:  [81 %-100 %] 96 % (04/20 1315) Weight:  [74.7 kg (164 lb 11.2 oz)-75.6 kg (166 lb 9.6 oz)] 74.7 kg (164 lb 11.2 oz) (04/20 0902)   General appearance: fatigued and no distress Resp: clear to auscultation bilaterally Cardio: regular rate and rhythm GI: normal findings: bowel sounds normal and soft, non-tender and abnormal findings:  distended Male genitalia: minimal scrotal tenderness  Lab Results  Recent Labs  03/15/17 0325 03/16/17 0232  WBC 10.0 8.8  HGB 9.3* 9.3*  HCT 28.0* 28.1*  NA 134* 133*  K 5.5* 4.6  CL 97* 96*  CO2 21* 19*  BUN 77* 75*  CREATININE 12.48* 12.21*   Liver Panel  Recent Labs  03/13/17 1643 03/15/17 0325 03/16/17 0232  PROT 5.8*  --   --   ALBUMIN 2.7* 2.5* 2.3*  AST 23  --   --  ALT 18  --   --   ALKPHOS 49  --   --   BILITOT 0.6  --   --    Sedimentation Rate No results for input(s): ESRSEDRATE in the last 72 hours. C-Reactive Protein No results for input(s): CRP in the last 72 hours.  Microbiology: Recent Results (from the past 240 hour(s))  Gram stain     Status: None   Collection Time: 03/13/17  8:15 PM  Result Value Ref Range Status   Specimen Description PERITONEAL  Final   Special Requests NONE  Final   Gram Stain   Final    DIRECT SMEAR NO WBC SEEN NO ORGANISMS SEEN RESULT CALLED TO, READ BACK BY AND VERIFIED WITH: M.PETERS,RN 2200 03/13/17 M.CAMPBELL    Report Status 03/13/2017 FINAL  Final  Culture, body fluid-bottle     Status: None (Preliminary result)   Collection Time: 03/13/17  8:15 PM  Result Value Ref Range  Status   Specimen Description FLUID PERITONEAL  Final   Special Requests   Final    BOTTLES DRAWN AEROBIC AND ANAEROBIC Blood Culture adequate volume   Culture NO GROWTH 2 DAYS  Final   Report Status PENDING  Incomplete  MRSA PCR Screening     Status: None   Collection Time: 03/14/17  5:18 AM  Result Value Ref Range Status   MRSA by PCR NEGATIVE NEGATIVE Final    Comment:        The GeneXpert MRSA Assay (FDA approved for NASAL specimens only), is one component of a comprehensive MRSA colonization surveillance program. It is not intended to diagnose MRSA infection nor to guide or monitor treatment for MRSA infections.   Culture, blood (Routine X 2) w Reflex to ID Panel     Status: None (Preliminary result)   Collection Time: 03/14/17  7:51 AM  Result Value Ref Range Status   Specimen Description BLOOD RIGHT HAND  Final   Special Requests   Final    BOTTLES DRAWN AEROBIC ONLY Blood Culture adequate volume   Culture NO GROWTH 1 DAY  Final   Report Status PENDING  Incomplete  Culture, blood (Routine X 2) w Reflex to ID Panel     Status: None (Preliminary result)   Collection Time: 03/14/17  8:14 AM  Result Value Ref Range Status   Specimen Description BLOOD RIGHT ASSIST CONTROL  Final   Special Requests IN PEDIATRIC BOTTLE Blood Culture adequate volume  Final   Culture NO GROWTH 1 DAY  Final   Report Status PENDING  Incomplete  Urine culture     Status: None   Collection Time: 03/14/17  9:20 AM  Result Value Ref Range Status   Specimen Description URINE, RANDOM  Final   Special Requests NONE  Final   Culture NO GROWTH  Final   Report Status 03/15/2017 FINAL  Final  Respiratory Panel by PCR     Status: None   Collection Time: 03/15/17 10:19 AM  Result Value Ref Range Status   Adenovirus NOT DETECTED NOT DETECTED Final   Coronavirus 229E NOT DETECTED NOT DETECTED Final   Coronavirus HKU1 NOT DETECTED NOT DETECTED Final   Coronavirus NL63 NOT DETECTED NOT DETECTED Final    Coronavirus OC43 NOT DETECTED NOT DETECTED Final   Metapneumovirus NOT DETECTED NOT DETECTED Final   Rhinovirus / Enterovirus NOT DETECTED NOT DETECTED Final   Influenza A NOT DETECTED NOT DETECTED Final   Influenza B NOT DETECTED NOT DETECTED Final   Parainfluenza Virus 1  NOT DETECTED NOT DETECTED Final   Parainfluenza Virus 2 NOT DETECTED NOT DETECTED Final   Parainfluenza Virus 3 NOT DETECTED NOT DETECTED Final   Parainfluenza Virus 4 NOT DETECTED NOT DETECTED Final   Respiratory Syncytial Virus NOT DETECTED NOT DETECTED Final   Bordetella pertussis NOT DETECTED NOT DETECTED Final   Chlamydophila pneumoniae NOT DETECTED NOT DETECTED Final   Mycoplasma pneumoniae NOT DETECTED NOT DETECTED Final    Studies/Results: Ct Abdomen Pelvis W Contrast  Result Date: 03/15/2017 CLINICAL DATA:  61 year old male, dialysis patient with fever of unknown origin. Initial encounter. EXAM: CT ABDOMEN AND PELVIS WITH CONTRAST TECHNIQUE: Multidetector CT imaging of the abdomen and pelvis was performed using the standard protocol following bolus administration of intravenous contrast. CONTRAST:  100 mL ISOVUE-300 IOPAMIDOL (ISOVUE-300) INJECTION 61% COMPARISON:  CT Abdomen and Pelvis 12/08/2016 FINDINGS: Lower chest: Lower lung volumes. New small layering left pleural fusion. Crowding of markings/mild atelectasis at both lung bases. No pericardial effusion. Hepatobiliary: Negative. Pancreas: Negative. Spleen: Negative. Adrenals/Urinary Tract: Adrenal glands are within normal limits. Both kidneys appear stable, with mildly decreased renal enhancement compared to that expected, but this is unchanged. Right renal parapelvic cyst or mild chronic right hydronephrosis is unchanged. Thick walled urinary bladder re- demonstrated. No gas within the bladder. Stomach/Bowel: Moderate volume of ascites throughout the abdomen similar to the study in January. There is a left abdominal approach peritoneal dialysis catheter. No  definite pneumoperitoneum. Decompressed stomach and duodenum. Proximal jejunum is fluid-filled, but there are no significantly dilated small bowel loops. There is a catheter looped in the rectum which has layering fluid. The sigmoid colon is redundant and partially filled with fluid Ing gas. There is fluid in the left colon and at the splenic flexure. Redundant splenic flexure. Redundant transverse colon. Gas at the hepatic flexure. Fluid in the right colon. No abnormally dilated large bowel Vascular/Lymphatic: Major arterial structures in the abdomen and pelvis are patent with minimal atherosclerosis. Portal venous system is patent. Reproductive: New left scrotal/penile hydrocele with trace gas (series 3, image 99). The remainder of the visible scrotum appears within normal limits. Other: Little to no pelvic free fluid. Musculoskeletal: Unchanged appearance of multilevel superior endplate compression fractures in the lower thoracic and lumbar spine including T9, T10, L1, L2, L3, and L4. No acute osseous abnormality identified. IMPRESSION: 1. Peritoneal dialysis patient with stable moderate volume of ascites compared to January. There is a new left scrotal/penile hydrocele which also contains trace gas, but this might be related to the peritoneal dialysis. Recommend correlation with scrotal physical exam and scrotal ultrasound as necessary. 2. There is fluid throughout the large bowel which may indicate diarrhea. There is a rectal catheter in place. No focal bowel inflammation or obstruction identified. 3. No other acute or inflammatory process identified in the abdomen or pelvis. 4. New small layering left pleural effusion. 5. Stable multilevel lower thoracic and lumbar compression fractures. Electronically Signed   By: Genevie Ann M.D.   On: 03/15/2017 15:34     Assessment/Plan: Fever  Peritoneal dialysis  Total days of antibiotics: 2 vanco/ceftaz, flucon  No clear source for his fever Has improved Will  stop respiratory isolation Will continue his anbx         Bobby Rumpf Infectious Diseases (pager) [phone] [email or web address] 03/16/2017, 1:32 PM  LOS: 3 days

## 2017-03-16 NOTE — Progress Notes (Signed)
Neurology Progress Note  Subjective: He is a little bit more alert overnight and this morning. He will not open his eyes and attend to people and they speak to him. However, he remains nonverbal and is not following commands. This precludes review of systems and also limits the examination. He was seen by infectious disease yesterday with recommendations for CT of the abdomen and pelvis and addition of Diflucan. His fever has improved with Tmax over the past 24 hours 100.68F at 1501 yesterday.  Medications reviewed and reconciled.   Pertinent meds: Ceftazidime 500 mg q24h Fluconazole 100 mg every 24 hours  Current Meds:   Current Facility-Administered Medications:  .  acetaminophen (TYLENOL) tablet 650 mg, 650 mg, Oral, Q6H PRN **OR** acetaminophen (TYLENOL) suppository 650 mg, 650 mg, Rectal, Q6H PRN, Lily Kocher, MD, 650 mg at 03/14/17 2356 .  cefTAZidime (FORTAZ) 500 mg in dextrose 5 % 50 mL IVPB, 500 mg, Intravenous, Q24H, Lyndee Leo, [facility], Stopped at 03/15/17 1905 .  chlorhexidine (PERIDEX) 0.12 % solution 15 mL, 15 mL, Mouth Rinse, BID, Eugenie Filler, MD, 15 mL at 03/15/17 2242 .  [START ON 03/19/2017] Darbepoetin Alfa (ARANESP) injection 150 mcg, 150 mcg, Subcutaneous, Q Mon-1800, Roney Jaffe, MD .  dextrose 50 % solution 25 mL, 25 mL, Intravenous, PRN, Veryl Speak, MD, 25 mL at 03/15/17 0818 .  dialysis solution 2.5% low-MG/low-CA dianeal solution, , Intraperitoneal, Q24H, Roney Jaffe, MD .  dialysis solution 2.5% low-MG/low-CA dianeal solution, , Intraperitoneal, Q24H, Roney Jaffe, MD .  fluconazole (DIFLUCAN) IVPB 100 mg, 100 mg, Intravenous, Q24H, Irine Seal V, MD .  gentamicin cream (GARAMYCIN) 0.1 % 1 application, 1 application, Topical, Daily, Roney Jaffe, MD .  heparin 2,500 Units in dialysis solution 2.5% low-MG/low-CA 5,000 mL dialysis solution, , Peritoneal Dialysis, PRN, Roney Jaffe, MD .  heparin injection 5,000 Units, 5,000 Units, Subcutaneous,  Q8H, Eugenie Filler, MD, 5,000 Units at 03/16/17 0555 .  hydrALAZINE (APRESOLINE) injection 20 mg, 20 mg, Intravenous, Q6H PRN, Lily Kocher, MD, 20 mg at 03/16/17 0551 .  insulin aspart (novoLOG) injection 0-9 Units, 0-9 Units, Subcutaneous, TID WC, Eugenie Filler, MD, 1 Units at 03/15/17 1755 .  insulin glargine (LANTUS) injection 8 Units, 8 Units, Subcutaneous, BID, Eugenie Filler, MD .  MEDLINE mouth rinse, 15 mL, Mouth Rinse, q12n4p, Irine Seal V, MD, 15 mL at 03/15/17 1622 .  nicardipine (CARDENE) 20mg  in 0.86% saline 217ml IV infusion (0.1 mg/ml), 3-15 mg/hr, Intravenous, Continuous, Eugenie Filler, MD, Last Rate: 80 mL/hr at 03/16/17 0637, 8 mg/hr at 03/16/17 0637 .  ondansetron (ZOFRAN) tablet 4 mg, 4 mg, Oral, Q6H PRN **OR** ondansetron (ZOFRAN) injection 4 mg, 4 mg, Intravenous, Q6H PRN, Lily Kocher, MD .  sodium chloride flush (NS) 0.9 % injection 3 mL, 3 mL, Intravenous, Q12H, Lily Kocher, MD, 3 mL at 03/15/17 2243  Objective:  Temp:  [98.1 F (36.7 C)-100.3 F (37.9 C)] 98.2 F (36.8 C) (04/20 0315) Pulse Rate:  [92-125] 104 (04/20 0700) Resp:  [0-34] 24 (04/20 0700) BP: (126-228)/(49-132) 159/67 (04/20 0700) SpO2:  [81 %-100 %] 97 % (04/20 0700) Weight:  [75.6 kg (166 lb 9.6 oz)] 75.6 kg (166 lb 9.6 oz) (04/20 0500)  General: WDWN AA male lying in bed. He opens his eyes to voice. He will look at me and track me around the bed when I speak to him. However, he is nonverbal and doesn't make any attempts to communicate. He did not follow any commands during the  examination. The examination is therefore limited. HEENT: Neck is supple without lymphadenopathy. Sclerae are anicteric. There is no conjunctival injection.  CV: Regular, 2/6 systolic murmur heard at the left sternal border and apex. No carotid bruits. Distal pulses 2+ and symmetric.  Lungs: CTAB on anterior auscultation. Extremities: No C/C/E. Neuro: MS: As noted above.  CN: Pupils are equal and  reactive from 3-2 mm bilaterally. He blinks to visual threat 4. Eyes are conjugate. Volitional saccades are intact in all directions with some breakup of smooth pursuits. No obvious nystagmus is appreciated. Corneals are intact. His face is symmetric with normal mobility and symmetric grimace. The remainder of his cranial nerve examination is deferred as he does not participate with the examination.  Motor: Normal bulk, tone. He moves all 4 extremities with grossly normal strength, at least 4/5 throughout. He does not participate with confrontational testing. No tremor or other abnormal movements are observed.  DTRs: 2+, symmetric in the arms and at both knees. Ankle jerks are absent. Toes are downgoing bilaterally. No pathological reflexes.  Coordination/gait: Deferred as he is unable to participate with the exam.  Labs: Lab Results  Component Value Date   WBC 8.8 03/16/2017   HGB 9.3 (L) 03/16/2017   HCT 28.1 (L) 03/16/2017   PLT 199 03/16/2017   GLUCOSE 322 (H) 03/16/2017   CHOL 150 03/14/2017   TRIG 37 03/14/2017   HDL 64 03/14/2017   LDLCALC 79 03/14/2017   ALT 18 03/13/2017   AST 23 03/13/2017   NA 133 (L) 03/16/2017   K 4.6 03/16/2017   CL 96 (L) 03/16/2017   CREATININE 12.21 (H) 03/16/2017   BUN 75 (H) 03/16/2017   CO2 19 (L) 03/16/2017   TSH 2.22 01/04/2017   PSA 353.00 (H) 12/09/2016   INR 1.17 11/24/2016   HGBA1C 8.5 (H) 03/14/2017   CBC Latest Ref Rng & Units 03/16/2017 03/15/2017 03/14/2017  WBC 4.0 - 10.5 K/uL 8.8 10.0 7.0  Hemoglobin 13.0 - 17.0 g/dL 9.3(L) 9.3(L) 9.9(L)  Hematocrit 39.0 - 52.0 % 28.1(L) 28.0(L) 29.0(L)  Platelets 150 - 400 K/uL 199 196 202    Lab Results  Component Value Date   HGBA1C 8.5 (H) 03/14/2017   Lab Results  Component Value Date   ALT 18 03/13/2017   AST 23 03/13/2017   ALKPHOS 49 03/13/2017   BILITOT 0.6 03/13/2017    Radiology:  There is no new neuroimaging for review.   Other diagnostic studies:  TTE showed normal LV  size and systolic function with EF 60-65%; no regional wall motion abnormalities; grade 1 diastolic dysfunction; mild calcification of aortic valve leaflets; mild calcification of the mitral valve annulus; increased thickness of atrial septum c/w lipomatous hypertrophy  Carotid Dopplers showed no hemodynamically significant stenosis.  A/P:  1. Acute encephalopathy: This appears mildly improved today. This is most likely a multifactorial toxic-metabolic process with contributors including fever, possible infection, severe hyperglycemia with associated hyperosmolality, worsening renal failure and uremia. Neuroimaging did not show any evidence of acute CNS pathology to explain his encephalopathy. EEG showed only diffuse slowing, no evidence of seizures or epileptiform focus. CNS infection is possible to suspicion is low. Consider reattempting lumbar puncture under fluoroscopy once his agitation is improved and he is able tolerate the procedure. Continue to optimize metabolic status as you are. Continue empiric antibiotics to treat any potential underlying infection. Minimize the use of opiates, benzos or any medication with strong anticholinergic properties as much as possible. Optimize sleep-wake cycles as much as  you can by keeping the room bright with activity during the day and dark and quiet at night. For agitation, recommend low-dose haloperidol or an atypical antipsychotic.   2. Possible facial droop: This was reported prior to presentation but no objective focal neurologic deficits have been seen during this admission. MRI was negative for stroke. Cannot fully exclude the possibility of TIA but this could also have been due to his extreme hyperglycemia. TTE is unremarkable. CUS showed no hemodynamically significant stenosis. LDL 79. Hgb a1c 8.5. Initiate low-dose aspirin when able to take POs. Resume statin when able to take POs.   No family was present at the time of my visit.  Melba Coon,  MD [facility]

## 2017-03-16 NOTE — Progress Notes (Signed)
Dr. Grandville Silos at bedside to evaluate pt and update family. VSS, NAD, protecting airway. This RN started keppra. Attempted second IV x 2. BP 153/90 - will hold hydralazine at this time.

## 2017-03-16 NOTE — Progress Notes (Signed)
PROGRESS NOTE    Jared [name]  QIW:[id number] DOB: 1956-10-27 DOA: 03/13/2017 PCP: Harland Dingwall, NP-C    Brief Narrative:  Patient is a 61 year old gentleman history of end-stage renal disease on peritoneal dialysis, hypertension, insulin-dependent diabetes mellitus, chronic cough, recent initiation of treatment for prostate cancer and prior seizures in the setting of hypoglycemia presented to the ED with altered mental status changes. Patient noted to have fevers as high as 103. Patient also noted to have blood sugars greater than 1000. Neurology and nephrology consulted.   Assessment & Plan:   Principal Problem:   Acute encephalopathy Active Problems:   CKD (chronic kidney disease) stage V requiring chronic dialysis ([place])   Diabetes mellitus due to underlying condition, uncontrolled, without complication, with long-term current use of insulin (HCC)   Prostate cancer (HCC)   Hyperglycemia   Accelerated hypertension   FUO (fever of unknown origin)   Weakness  #1 acute encephalopathy/fever Questionable etiology. Patient more alert and interactive today. Patient noted on presentation to have a blood sugar of greater than 1000 and also noted to have high fevers. MAXIMUM TEMPERATURE of 103.Also concerns for seizures Patient was placed on the glucose stabilizer and has been transitioned off to Lantus. Blood sugars have improved from admission and arranging from 63-90. MRI MRA head negative for any acute abnormalities. Patient has been pancultured results pending. Peritoneal fluid has been cultured results with no growth to date. Patient more alert today. Fever curve trending down. EEG done 03/14/2017 with diffuse slowing of waking background which is nonspecific, absence of epileptiform discharges. LP was attempted by neurology however due to patient's inability to stay still it was aborted. LP attempted under fluoroscopy which had to be aborted as well due to agitation despite patient getting  Ativan and Haldol. CT abdomen and pelvis with no source of infection noted. Continue empiric IV vancomycin, IV Fortaz, IV Diflucan.. Neurology, ID, nephrology following.   #2 diabetes mellitus uncontrolled and on insulin-dependent diabetes mellitus Blood sugars on admission was 1127. ?? Etiology. Concern for infectious etiology as patient noted to have fevers up to 103. Patient has been pancultured and results pending. Peritoneal fluid has been cultured with no growth to date. Patient was placed on glucose stabilizer and has currently been transitioned to subcutaneous insulin. Patient noted to have a blood glucose of 333 this morning. Increased Lantus to 8 units twice daily. Discontinue D5 normal saline. Follow CBGs and adjust insulin as needed.   #3 malignant hypertension Patient noted on admission to have systolic blood pressures in the 200s. Patient with no significant improvement with IV Lopressor and IV enalapril. Discontinued IV Lopressor and IV enalapril, Nitropaste. Significant improvement of blood pressure on Cardene drip. Discontinue Cardene drip. Place on Lopressor 7.5 g IV every 8 hours.   #4 end-stage renal disease on peroteneal dialysis Nephrology has been consulted. Per nephrology.  #5 history of prostate cancer On Lupron injections. Outpatient follow-up with urology.  #6 anemia Follow H&H.    DVT prophylaxis: Heparin Code Status: Full Family Communication: Updated family at bedside.  Disposition Plan: Pending medical improvement. SNF versus home.   Consultants:   Neurology: Dr.Stewart 03/13/2017  Nephrology 03/14/2017  Infectious diseases: Dr. Johnnye Sima 03/15/2017  Procedures:   MRI MRA head 03/14/2017  CT head 03/13/2017  CXR 03/15/2107  CT abdomen and pelvis 03/15/2017  EEG 03/14/2017  Antimicrobials:  IV vancomycin 03/14/2017  IV Fortaz 03/14/2017  IV Diflucan 03/15/2017   Subjective: Patient alert today. Patient interacting. Patient says yes to  all  questions asked.  Objective: Vitals:   03/16/17 1145 03/16/17 1159 03/16/17 1200 03/16/17 1215  BP: (!) 162/84 (!) 143/81 (!) 149/80 (!) 153/88  Pulse: (!) 105 94 95 98  Resp: 16  19 (!) 25  Temp:      TempSrc:      SpO2: 95% 97% 97% 97%  Weight:      Height:        Intake/Output Summary (Last 24 hours) at 03/16/17 1231 Last data filed at 03/16/17 0908  Gross per 24 hour  Intake         17124.67 ml  Output            18268 ml  Net         -1143.33 ml   Filed Weights   03/15/17 0500 03/16/17 0500 03/16/17 0902  Weight: 78.3 kg (172 lb 9.6 oz) 75.6 kg (166 lb 9.6 oz) 74.7 kg (164 lb 11.2 oz)    Examination:  General exam: Alert . Respiratory system: Clear to auscultation anterior lung fields.Marland Kitchen Respiratory effort normal. Cardiovascular system: S1 & S2 heard, Tachycardia. No JVD, murmurs, rubs, gallops or clicks. No pedal edema. Gastrointestinal system: Abdomen is nondistended, soft and nontender. No organomegaly or masses felt. Normal bowel sounds heard. Central nervous system: Alert Extremities: Moving extremities spontaneously.LUE with avf with thrill, no s/sx of infection. Skin: No rashes, lesions or ulcers Psychiatry: Unable to assess.    Data Reviewed: I have personally reviewed following labs and imaging studies  CBC:  Recent Labs Lab 03/13/17 1643 03/14/17 0237 03/15/17 0325 03/16/17 0232  WBC 2.7* 7.0 10.0 8.8  NEUTROABS 1.7  --  7.8* 6.7  HGB 8.9* 9.9* 9.3* 9.3*  HCT 25.9* 29.0* 28.0* 28.1*  MCV 81.2 79.7 80.7 80.7  PLT 158 202 196 540   Basic Metabolic Panel:  Recent Labs Lab 03/13/17 1643 03/14/17 0237 03/15/17 0325 03/16/17 0232  NA 122* 135 134* 133*  K 5.6* 4.6 5.5* 4.6  CL 87* 97* 97* 96*  CO2 20* 23 21* 19*  GLUCOSE 1,127* 247* 88 322*  BUN 55* 58* 77* 75*  CREATININE 9.88* 10.89* 12.48* 12.21*  CALCIUM 8.1* 8.9 8.7* 8.3*  PHOS  --   --  9.1* 7.6*   GFR: Estimated Creatinine Clearance: 6.5 mL/min (A) (by C-G formula based on  SCr of 12.21 mg/dL (H)). Liver Function Tests:  Recent Labs Lab 03/13/17 1643 03/15/17 0325 03/16/17 0232  AST 23  --   --   ALT 18  --   --   ALKPHOS 49  --   --   BILITOT 0.6  --   --   PROT 5.8*  --   --   ALBUMIN 2.7* 2.5* 2.3*   No results for input(s): LIPASE, AMYLASE in the last 168 hours. No results for input(s): AMMONIA in the last 168 hours. Coagulation Profile: No results for input(s): INR, PROTIME in the last 168 hours. Cardiac Enzymes:  Recent Labs Lab 03/13/17 2349 03/14/17 0237 03/14/17 0825  TROPONINI <0.03 0.04* 0.06*   BNP (last 3 results) No results for input(s): PROBNP in the last 8760 hours. HbA1C:  Recent Labs  03/14/17 0815  HGBA1C 8.5*   CBG:  Recent Labs Lab 03/15/17 1912 03/15/17 2332 03/16/17 0317 03/16/17 0736 03/16/17 1105  GLUCAP 166* 242* 333* 337* 192*   Lipid Profile:  Recent Labs  03/14/17 0237  CHOL 150  HDL 64  LDLCALC 79  TRIG 37  CHOLHDL 2.3   Thyroid Function Tests: No  results for input(s): TSH, T4TOTAL, FREET4, T3FREE, THYROIDAB in the last 72 hours. Anemia Panel: No results for input(s): VITAMINB12, FOLATE, FERRITIN, TIBC, IRON, RETICCTPCT in the last 72 hours. Sepsis Labs:  Recent Labs Lab 03/13/17 1649  LATICACIDVEN 1.35    Recent Results (from the past 240 hour(s))  Gram stain     Status: None   Collection Time: 03/13/17  8:15 PM  Result Value Ref Range Status   Specimen Description PERITONEAL  Final   Special Requests NONE  Final   Gram Stain   Final    DIRECT SMEAR NO WBC SEEN NO ORGANISMS SEEN RESULT CALLED TO, READ BACK BY AND VERIFIED WITH: M.PETERS,RN 2200 03/13/17 M.CAMPBELL    Report Status 03/13/2017 FINAL  Final  Culture, body fluid-bottle     Status: None (Preliminary result)   Collection Time: 03/13/17  8:15 PM  Result Value Ref Range Status   Specimen Description FLUID PERITONEAL  Final   Special Requests   Final    BOTTLES DRAWN AEROBIC AND ANAEROBIC Blood Culture  adequate volume   Culture NO GROWTH 2 DAYS  Final   Report Status PENDING  Incomplete  MRSA PCR Screening     Status: None   Collection Time: 03/14/17  5:18 AM  Result Value Ref Range Status   MRSA by PCR NEGATIVE NEGATIVE Final    Comment:        The GeneXpert MRSA Assay (FDA approved for NASAL specimens only), is one component of a comprehensive MRSA colonization surveillance program. It is not intended to diagnose MRSA infection nor to guide or monitor treatment for MRSA infections.   Culture, blood (Routine X 2) w Reflex to ID Panel     Status: None (Preliminary result)   Collection Time: 03/14/17  7:51 AM  Result Value Ref Range Status   Specimen Description BLOOD RIGHT HAND  Final   Special Requests   Final    BOTTLES DRAWN AEROBIC ONLY Blood Culture adequate volume   Culture NO GROWTH 1 DAY  Final   Report Status PENDING  Incomplete  Culture, blood (Routine X 2) w Reflex to ID Panel     Status: None (Preliminary result)   Collection Time: 03/14/17  8:14 AM  Result Value Ref Range Status   Specimen Description BLOOD RIGHT ASSIST CONTROL  Final   Special Requests IN PEDIATRIC BOTTLE Blood Culture adequate volume  Final   Culture NO GROWTH 1 DAY  Final   Report Status PENDING  Incomplete  Urine culture     Status: None   Collection Time: 03/14/17  9:20 AM  Result Value Ref Range Status   Specimen Description URINE, RANDOM  Final   Special Requests NONE  Final   Culture NO GROWTH  Final   Report Status 03/15/2017 FINAL  Final  Respiratory Panel by PCR     Status: None   Collection Time: 03/15/17 10:19 AM  Result Value Ref Range Status   Adenovirus NOT DETECTED NOT DETECTED Final   Coronavirus 229E NOT DETECTED NOT DETECTED Final   Coronavirus HKU1 NOT DETECTED NOT DETECTED Final   Coronavirus NL63 NOT DETECTED NOT DETECTED Final   Coronavirus OC43 NOT DETECTED NOT DETECTED Final   Metapneumovirus NOT DETECTED NOT DETECTED Final   Rhinovirus / Enterovirus NOT  DETECTED NOT DETECTED Final   Influenza A NOT DETECTED NOT DETECTED Final   Influenza B NOT DETECTED NOT DETECTED Final   Parainfluenza Virus 1 NOT DETECTED NOT DETECTED Final   Parainfluenza Virus 2  NOT DETECTED NOT DETECTED Final   Parainfluenza Virus 3 NOT DETECTED NOT DETECTED Final   Parainfluenza Virus 4 NOT DETECTED NOT DETECTED Final   Respiratory Syncytial Virus NOT DETECTED NOT DETECTED Final   Bordetella pertussis NOT DETECTED NOT DETECTED Final   Chlamydophila pneumoniae NOT DETECTED NOT DETECTED Final   Mycoplasma pneumoniae NOT DETECTED NOT DETECTED Final         Radiology Studies: Ct Abdomen Pelvis W Contrast  Result Date: 03/15/2017 CLINICAL DATA:  61 year old male, dialysis patient with fever of unknown origin. Initial encounter. EXAM: CT ABDOMEN AND PELVIS WITH CONTRAST TECHNIQUE: Multidetector CT imaging of the abdomen and pelvis was performed using the standard protocol following bolus administration of intravenous contrast. CONTRAST:  100 mL ISOVUE-300 IOPAMIDOL (ISOVUE-300) INJECTION 61% COMPARISON:  CT Abdomen and Pelvis 12/08/2016 FINDINGS: Lower chest: Lower lung volumes. New small layering left pleural fusion. Crowding of markings/mild atelectasis at both lung bases. No pericardial effusion. Hepatobiliary: Negative. Pancreas: Negative. Spleen: Negative. Adrenals/Urinary Tract: Adrenal glands are within normal limits. Both kidneys appear stable, with mildly decreased renal enhancement compared to that expected, but this is unchanged. Right renal parapelvic cyst or mild chronic right hydronephrosis is unchanged. Thick walled urinary bladder re- demonstrated. No gas within the bladder. Stomach/Bowel: Moderate volume of ascites throughout the abdomen similar to the study in January. There is a left abdominal approach peritoneal dialysis catheter. No definite pneumoperitoneum. Decompressed stomach and duodenum. Proximal jejunum is fluid-filled, but there are no significantly  dilated small bowel loops. There is a catheter looped in the rectum which has layering fluid. The sigmoid colon is redundant and partially filled with fluid Ing gas. There is fluid in the left colon and at the splenic flexure. Redundant splenic flexure. Redundant transverse colon. Gas at the hepatic flexure. Fluid in the right colon. No abnormally dilated large bowel Vascular/Lymphatic: Major arterial structures in the abdomen and pelvis are patent with minimal atherosclerosis. Portal venous system is patent. Reproductive: New left scrotal/penile hydrocele with trace gas (series 3, image 99). The remainder of the visible scrotum appears within normal limits. Other: Little to no pelvic free fluid. Musculoskeletal: Unchanged appearance of multilevel superior endplate compression fractures in the lower thoracic and lumbar spine including T9, T10, L1, L2, L3, and L4. No acute osseous abnormality identified. IMPRESSION: 1. Peritoneal dialysis patient with stable moderate volume of ascites compared to January. There is a new left scrotal/penile hydrocele which also contains trace gas, but this might be related to the peritoneal dialysis. Recommend correlation with scrotal physical exam and scrotal ultrasound as necessary. 2. There is fluid throughout the large bowel which may indicate diarrhea. There is a rectal catheter in place. No focal bowel inflammation or obstruction identified. 3. No other acute or inflammatory process identified in the abdomen or pelvis. 4. New small layering left pleural effusion. 5. Stable multilevel lower thoracic and lumbar compression fractures. Electronically Signed   By: Genevie Ann M.D.   On: 03/15/2017 15:34        Scheduled Meds: . chlorhexidine  15 mL Mouth Rinse BID  . [START ON 03/19/2017] darbepoetin (ARANESP) injection - NON-DIALYSIS  150 mcg Subcutaneous Q Mon-1800  . fluconazole (DIFLUCAN) IV  100 mg Intravenous Q24H  . gentamicin cream  1 application Topical Daily  .  heparin subcutaneous  5,000 Units Subcutaneous Q8H  . insulin aspart  0-9 Units Subcutaneous TID WC  . insulin glargine  8 Units Subcutaneous BID  . mouth rinse  15 mL Mouth Rinse q12n4p  .  metoprolol  7.5 mg Intravenous Q8H  . sodium chloride flush  3 mL Intravenous Q12H   Continuous Infusions: . cefTAZidime (FORTAZ)  IV Stopped (03/15/17 1905)  . dialysis solution 2.5% low-MG/low-CA    . dialysis solution 2.5% low-MG/low-CA       LOS: 3 days    Time spent: 79 mins    Tasnim Balentine, MD [facility] Pager [phone]  If 7PM-7AM, please contact night-coverage [email or web address] Password [facility] 03/16/2017, 12:31 PM

## 2017-03-16 NOTE — Progress Notes (Signed)
  Speech Language Pathology Treatment: Dysphagia;Cognitive-Linquistic  Patient Details Name: Jared [name] MRN: [id number] DOB: 01-16-1956 Today's Date: 03/16/2017 Time: 8295-6213 SLP Time Calculation (min) (ACUTE ONLY): 22 min  Assessment / Plan / Recommendation Clinical Impression  Pt is much more alert today with eyes open throughout session with Min cues. He is making more attempts to communicate, although verbal output is aphasic and characterized by jargon, phonemic paraphasias, and perseverative errors. He is moving his RUE and looking to the right more spontaneously today. Pt self-fed trials of thin liquids with impulsive rate of intake. He consumed >3 ounces of water consecutively with no overt signs of aspiration observed. He had increased oral holding with boluses of puree, with intermittent throat clearing noted while food was still in his mouth. He does not open his mouth much to command, but with use of suction he did not appear to have residuals. Given his current mentation, would recommend starting with a full liquid diet, meds crushed in puree. SLP will f/u for tolerance and hopeful advancement as mentation improves.   HPI HPI: 61 year old male who presents with AMS, hyperglycemia (>1000), fevers. MRI was negative for acute infarct but additional w/u is pending. PMH incldues ESRD, diabetes, seizures, HTN, chronic cough, prostate cancer       SLP Plan  Continue with current plan of care       Recommendations  Diet recommendations: Thin liquid Liquids provided via: Straw;Cup Medication Administration: Crushed with puree Supervision: Staff to assist with self feeding;Full supervision/cueing for compensatory strategies Compensations: Minimize environmental distractions;Slow rate;Small sips/bites Postural Changes and/or Swallow Maneuvers: Seated upright 90 degrees                Oral Care Recommendations: Oral care BID Follow up Recommendations: Inpatient Rehab SLP  Visit Diagnosis: Dysphagia, unspecified (R13.10);Cognitive communication deficit (Y86.578) Plan: Continue with current plan of care       GO                [name] 03/16/2017, 11:55 AM  [name], M.A. CCC-SLP [phone]

## 2017-03-16 NOTE — Progress Notes (Signed)
Jared [name], dialysis RN, regarding peritoneal dialysis machine reading "treatment complete." States will come evaluate pt and treatment shortly. Pt resting comfortably at this time, NAD, VSS.

## 2017-03-16 NOTE — Progress Notes (Signed)
Inpatient Diabetes Program Recommendations  AACE/ADA: New Consensus Statement on Inpatient Glycemic Control (2015)  Target Ranges:  Prepandial:   less than 140 mg/dL      Peak postprandial:   less than 180 mg/dL (1-2 hours)      Critically ill patients:  140 - 180 mg/dL   Lab Results  Component Value Date   GLUCAP 192 (H) 03/16/2017   HGBA1C 8.5 (H) 03/14/2017    Review of Glycemic Control:  Results for Jared [name], Jared [name] (MRN [id number]) as of 03/16/2017 14:37  Ref. Range 03/15/2017 19:12 03/15/2017 23:32 03/16/2017 03:17 03/16/2017 07:36 03/16/2017 11:05  Glucose-Capillary Latest Ref Range: 65 - 99 mg/dL 166 (H) 242 (H) 333 (H) 337 (H) 192 (H)   Note blood sugars increased overnight after being low during the day on 03/15/17.  Agree with increasing Lantus to 8 units bid.  Blood sugar improved at lunch.    Thanks, Adah Perl, RN, BC-ADM Inpatient Diabetes Coordinator Pager [phone] (8a-5p)

## 2017-03-16 NOTE — Progress Notes (Signed)
[facility] Progress Note   Subjective: agitation mostly resolved today, pt attempts to open eyes.  Making eye contact today, nods head to some questions.   Vitals:   03/16/17 1145 03/16/17 1159 03/16/17 1200 03/16/17 1215  BP: (!) 162/84 (!) 143/81 (!) 149/80 (!) 153/88  Pulse: (!) 105 94 95 98  Resp: 16  19 (!) 25  Temp:      TempSrc:      SpO2: 95% 97% 97% 97%  Weight:      Height:        Inpatient medications: . chlorhexidine  15 mL Mouth Rinse BID  . [START ON 03/19/2017] darbepoetin (ARANESP) injection - NON-DIALYSIS  150 mcg Subcutaneous Q Mon-1800  . fluconazole (DIFLUCAN) IV  100 mg Intravenous Q24H  . gentamicin cream  1 application Topical Daily  . heparin subcutaneous  5,000 Units Subcutaneous Q8H  . insulin aspart  0-9 Units Subcutaneous TID WC  . insulin glargine  8 Units Subcutaneous BID  . mouth rinse  15 mL Mouth Rinse q12n4p  . metoprolol  7.5 mg Intravenous Q8H  . sodium chloride flush  3 mL Intravenous Q12H   . cefTAZidime (FORTAZ)  IV Stopped (03/15/17 1905)  . dialysis solution 2.5% low-MG/low-CA    . dialysis solution 2.5% low-MG/low-CA     acetaminophen **OR** acetaminophen, dextrose, dianeal solution for CAPD/CCPD with heparin, hydrALAZINE, ondansetron **OR** ondansetron (ZOFRAN) IV  Exam: Much less agitated and no longer having rigors/ shivering Chest clear bilat RRR tachy no RG Abd soft ntnd PD cath w clean exit site Ext L upper arm AV fistula with +bruit No open wounds on back/ legs or arms Neuro poorly responsive, moves all ext  Dialysis: CCPD  6 exchanges/ 24 hrs, 2500 overnight and 2000 day dwell.  Dwell time 1h 20 min. Dry wt 75.5kg.  Mircera 200 ug given 3/28, next dose 50ug sched for 4/25, Venofer 250 mg scheduled for 4/25       Assessment: 1. Fevers - starting to come down, unclear cause. Per primary/ ID.  2. AMS - seems to be improving some w time/ defervescing 3. ESRD - cont CCPD, using all 2.5% for now, vol down  and BP's better  4. HTN'sive crisis - off Cardene now, BP's much better, vol down w PD 5. Hx prostate Ca - on Lupron 6. DM - per primary 7. Anemia of CKD - Hb 8-9.5, last mircera was 3/28. Hold IV fe with active infection. Give darbe next week on Monday 150 ug.  8. MBD - holding po meds for now  Plan - cont CCPD, change to 1/2 1.5% and 1/2 2.5%   Kelly Splinter MD [facility] pager [phone]   03/16/2017, 12:28 PM    Recent Labs Lab 03/14/17 0237 03/15/17 0325 03/16/17 0232  NA 135 134* 133*  K 4.6 5.5* 4.6  CL 97* 97* 96*  CO2 23 21* 19*  GLUCOSE 247* 88 322*  BUN 58* 77* 75*  CREATININE 10.89* 12.48* 12.21*  CALCIUM 8.9 8.7* 8.3*  PHOS  --  9.1* 7.6*    Recent Labs Lab 03/13/17 1643 03/15/17 0325 03/16/17 0232  AST 23  --   --   ALT 18  --   --   ALKPHOS 49  --   --   BILITOT 0.6  --   --   PROT 5.8*  --   --   ALBUMIN 2.7* 2.5* 2.3*    Recent Labs Lab 03/13/17 1643 03/14/17 0237 03/15/17 0325  03/16/17 0232  WBC 2.7* 7.0 10.0 8.8  NEUTROABS 1.7  --  7.8* 6.7  HGB 8.9* 9.9* 9.3* 9.3*  HCT 25.9* 29.0* 28.0* 28.1*  MCV 81.2 79.7 80.7 80.7  PLT 158 202 196 199   Iron/TIBC/Ferritin/ %Sat    Component Value Date/Time   IRON 78 11/28/2016 1546   TIBC 162 (L) 11/28/2016 1546   IRONPCTSAT 48 (H) 11/28/2016 1546

## 2017-03-16 NOTE — Progress Notes (Signed)
Pt family member notified RN pt was having seizure-like activity. This RN entered room, seizure-like activity lasted 45-60sec. CBG 95, BP 193/88. Pt protecting airway, opened eyes after seizure and appeared to be post-ictal. Notified Dr. Grandville Silos and Dr. Shon Hale. See assoc. orders

## 2017-03-16 NOTE — Progress Notes (Signed)
Was called by nursing that patient had 2 seizure-like activities and in postictal state. CBG obtained at that time was 95. Patient also noted to have a systolic blood pressure 451 at that time. Came and assessed patient. Patient currently postictal. Gen.: Lethargic, opens eyes to verbal stimuli Respirations: Clear to auscultation anterior lung fields bilaterally. GI: Abdomen soft, nontender, nondistended, positive bowel sounds. Extremities: No clubbing cyanosis or edema  Assessment/plan #1 seizure-like activity Patient noted per nursing to have seizure-like activity lasting about 60-90 seconds 2. Patient had MRI of the head during this admission that was negative for any acute abnormalities. EEG done showed generalized slowing however no epileptiform activity. Nurse contacted neurology was following the patient and patient placed on Keppra 250 mg IV daily. Patient also given Ativan 2 mg IV 1. Place patient on seizure precautions. Placed on Ativan 1-2 mg every 2 hours when necessary seizures. Check a comprehensive metabolic profile, CBC, magnesium level. Neuro checks every 4 hours. Per neurology.

## 2017-03-17 ENCOUNTER — Inpatient Hospital Stay (HOSPITAL_COMMUNITY): Payer: Medicare Other

## 2017-03-17 DIAGNOSIS — R4702 Dysphasia: Secondary | ICD-10-CM

## 2017-03-17 DIAGNOSIS — R4781 Slurred speech: Secondary | ICD-10-CM

## 2017-03-17 DIAGNOSIS — R4701 Aphasia: Secondary | ICD-10-CM | POA: Clinically undetermined

## 2017-03-17 LAB — CBC WITH DIFFERENTIAL/PLATELET
BASOS PCT: 0 %
Basophils Absolute: 0 10*3/uL (ref 0.0–0.1)
EOS ABS: 0.1 10*3/uL (ref 0.0–0.7)
Eosinophils Relative: 2 %
HEMATOCRIT: 27.8 % — AB (ref 39.0–52.0)
HEMOGLOBIN: 9.3 g/dL — AB (ref 13.0–17.0)
LYMPHS ABS: 1.3 10*3/uL (ref 0.7–4.0)
Lymphocytes Relative: 21 %
MCH: 27.5 pg (ref 26.0–34.0)
MCHC: 33.5 g/dL (ref 30.0–36.0)
MCV: 82.2 fL (ref 78.0–100.0)
MONOS PCT: 9 %
Monocytes Absolute: 0.6 10*3/uL (ref 0.1–1.0)
NEUTROS ABS: 4.5 10*3/uL (ref 1.7–7.7)
NEUTROS PCT: 68 %
Platelets: 165 10*3/uL (ref 150–400)
RBC: 3.38 MIL/uL — AB (ref 4.22–5.81)
RDW: 16.3 % — ABNORMAL HIGH (ref 11.5–15.5)
WBC: 6.4 10*3/uL (ref 4.0–10.5)

## 2017-03-17 LAB — RENAL FUNCTION PANEL
ANION GAP: 16 — AB (ref 5–15)
Albumin: 2.1 g/dL — ABNORMAL LOW (ref 3.5–5.0)
BUN: 55 mg/dL — ABNORMAL HIGH (ref 6–20)
CALCIUM: 8.3 mg/dL — AB (ref 8.9–10.3)
CHLORIDE: 99 mmol/L — AB (ref 101–111)
CO2: 23 mmol/L (ref 22–32)
CREATININE: 10.85 mg/dL — AB (ref 0.61–1.24)
GFR, EST AFRICAN AMERICAN: 5 mL/min — AB (ref >60)
GFR, EST NON AFRICAN AMERICAN: 4 mL/min — AB (ref >60)
Glucose, Bld: 238 mg/dL — ABNORMAL HIGH (ref 65–99)
Phosphorus: 7.1 mg/dL — ABNORMAL HIGH (ref 2.5–4.6)
Potassium: 4 mmol/L (ref 3.5–5.1)
Sodium: 138 mmol/L (ref 135–145)

## 2017-03-17 LAB — GLUCOSE, CAPILLARY
GLUCOSE-CAPILLARY: 97 mg/dL (ref 65–99)
GLUCOSE-CAPILLARY: 83 mg/dL (ref 65–99)
GLUCOSE-CAPILLARY: 116 mg/dL — AB (ref 65–99)
GLUCOSE-CAPILLARY: 117 mg/dL — AB (ref 65–99)
Glucose-Capillary: 235 mg/dL — ABNORMAL HIGH (ref 65–99)
Glucose-Capillary: 242 mg/dL — ABNORMAL HIGH (ref 65–99)

## 2017-03-17 LAB — VANCOMYCIN, RANDOM: Vancomycin Rm: 21

## 2017-03-17 MED ORDER — HEPARIN 1000 UNIT/ML FOR PERITONEAL DIALYSIS
INTRAPERITONEAL | Status: DC | PRN
  Filled 2017-03-17: qty 5000

## 2017-03-17 MED ORDER — MAGNESIUM SULFATE 4 GM/100ML IV SOLN
4.0000 g | Freq: Once | INTRAVENOUS | Status: AC
Start: 2017-03-17 — End: 2017-03-17
  Administered 2017-03-17: 4 g via INTRAVENOUS
  Filled 2017-03-17 (×2): qty 100

## 2017-03-17 MED ORDER — GENTAMICIN SULFATE 0.1 % EX CREA
1.0000 "application " | TOPICAL_CREAM | Freq: Every day | CUTANEOUS | Status: DC
  Administered 2017-03-19: 1 via TOPICAL
  Filled 2017-03-17: qty 15

## 2017-03-17 MED ORDER — DELFLEX-LC/1.5% DEXTROSE 344 MOSM/L IP SOLN
INTRAPERITONEAL | Status: DC
  Administered 2017-03-20: 15000 mL via INTRAPERITONEAL

## 2017-03-17 MED ORDER — DELFLEX-LC/1.5% DEXTROSE 344 MOSM/L IP SOLN: INTRAPERITONEAL | Status: DC

## 2017-03-17 MED ORDER — VANCOMYCIN HCL IN DEXTROSE 750-5 MG/150ML-% IV SOLN
750.0000 mg | Freq: Once | INTRAVENOUS | Status: AC
  Administered 2017-03-17: 750 mg via INTRAVENOUS
  Filled 2017-03-17: qty 150

## 2017-03-17 MED ORDER — ENALAPRILAT 1.25 MG/ML IV SOLN
1.2500 mg | Freq: Four times a day (QID) | INTRAVENOUS | Status: DC
  Administered 2017-03-17 – 2017-03-18 (×6): 1.25 mg via INTRAVENOUS
  Filled 2017-03-17 (×7): qty 1

## 2017-03-17 MED ORDER — HYDRALAZINE HCL 20 MG/ML IJ SOLN
5.0000 mg | Freq: Once | INTRAMUSCULAR | Status: AC | PRN
Start: 2017-03-17 — End: 2017-03-17
  Administered 2017-03-17: 5 mg via INTRAVENOUS
  Filled 2017-03-17: qty 1

## 2017-03-17 MED ORDER — LABETALOL HCL 5 MG/ML IV SOLN
10.0000 mg | INTRAVENOUS | Status: AC | PRN
  Administered 2017-03-18 (×2): 10 mg via INTRAVENOUS
  Filled 2017-03-17 (×2): qty 4

## 2017-03-17 MED ORDER — HEPARIN 1000 UNIT/ML FOR PERITONEAL DIALYSIS: 500.0000 [IU] | INTRAMUSCULAR | Status: DC | PRN

## 2017-03-17 MED ORDER — METOPROLOL TARTRATE 5 MG/5ML IV SOLN
10.0000 mg | Freq: Three times a day (TID) | INTRAVENOUS | Status: DC
  Administered 2017-03-17 – 2017-03-18 (×4): 10 mg via INTRAVENOUS
  Filled 2017-03-17 (×4): qty 10

## 2017-03-17 NOTE — Progress Notes (Signed)
[facility] Progress Note   Subjective: calm today, opens eyes to voice, not oriented and not responding to questions.  Had seizure(s) last night, on IV Keppra now.   Vitals:   03/17/17 0800 03/17/17 1009 03/17/17 1015 03/17/17 1045  BP: (!) 158/83 (!) 182/90 (!) 166/103 134/72  Pulse: 84 82    Resp: 16 18 17 17   Temp: 98.7 F (37.1 C) 97.8 F (36.6 C)    TempSrc: Oral Axillary    SpO2: 99%     Weight:      Height:        Inpatient medications: . chlorhexidine  15 mL Mouth Rinse BID  . [START ON 03/19/2017] darbepoetin (ARANESP) injection - NON-DIALYSIS  150 mcg Subcutaneous Q Mon-1800  . enalaprilat  1.25 mg Intravenous Q6H  . fluconazole (DIFLUCAN) IV  100 mg Intravenous Q24H  . gentamicin cream  1 application Topical Daily  . heparin subcutaneous  5,000 Units Subcutaneous Q8H  . hydrALAZINE  5 mg Intravenous Once  . insulin aspart  0-9 Units Subcutaneous TID WC  . insulin glargine  8 Units Subcutaneous BID  . mouth rinse  15 mL Mouth Rinse q12n4p  . metoprolol  10 mg Intravenous Q8H  . sodium chloride flush  3 mL Intravenous Q12H   . cefTAZidime (FORTAZ)  IV 500 mg (03/16/17 1802)  . dialysis solution 1.5% low-MG/low-CA    . dialysis solution 2.5% low-MG/low-CA    . levETIRAcetam Stopped (03/16/17 1610)  . magnesium sulfate 1 - 4 g bolus IVPB 4 g (03/17/17 0906)  . vancomycin     acetaminophen **OR** acetaminophen, dextrose, dianeal solution for CAPD/CCPD with heparin, dianeal solution for CAPD/CCPD with heparin, hydrALAZINE, hydrALAZINE, LORazepam, ondansetron **OR** ondansetron (ZOFRAN) IV  Exam: Much less agitated and no longer having rigors/ shivering Chest clear bilat RRR tachy no RG Abd soft ntnd PD cath w clean exit site Ext L upper arm AV fistula with +bruit No open wounds on back/ legs or arms Neuro poorly responsive, moves all ext  Dialysis: [facility]  6 exchanges/ 24 hrs, 2500 overnight and 2000 day dwell.  Dwell time 1h 20 min. Dry wt  75.5kg.  Mircera 200 ug given 3/28, next dose 50ug sched for 4/25, Venofer 250 mg scheduled for 4/25       Assessment: 1. Fevers - improved, on emp abx/ antifungals. Per primary/ ID.  2. AMS - more alert, still confused and mostly nonverbal 3. Seizures -started on Keppra IV 4. ESRD - cont CCPD, got vol down, change over to 1.5% bags as he is not eating 5. HTN'sive crisis - off Cardene drip, getting IV ACEi / IV MTP scheduled 6. Hx prostate Ca - on Lupron 7. DM - per primary 8. Anemia of CKD - Hb 8-9.5, last mircera was 3/28. Hold IV fe with active infection. Give darbe next week on Monday 150 ug.  9. MBD - holding po meds for now  Plan - cont CCPD, change to all 1.5%   Kelly Splinter MD [place] pager [phone]   03/17/2017, 11:01 AM    Recent Labs Lab 03/15/17 0325 03/16/17 0232 03/16/17 1624 03/17/17 0824  NA 134* 133* 138 138  K 5.5* 4.6 4.2 4.0  CL 97* 96* 100* 99*  CO2 21* 19* 22 23  GLUCOSE 88 322* 105* 238*  BUN 77* 75* 68* 55*  CREATININE 12.48* 12.21* 12.14* 10.85*  CALCIUM 8.7* 8.3* 8.2* 8.3*  PHOS 9.1* 7.6*  --  7.1*    Recent  Labs Lab 03/13/17 1643 03/15/17 0325 03/16/17 0232 03/17/17 0824  AST 23  --   --   --   ALT 18  --   --   --   ALKPHOS 49  --   --   --   BILITOT 0.6  --   --   --   PROT 5.8*  --   --   --   ALBUMIN 2.7* 2.5* 2.3* 2.1*    Recent Labs Lab 03/16/17 0232 03/16/17 1624 03/17/17 0824  WBC 8.8 6.7 6.4  NEUTROABS 6.7 5.1 4.5  HGB 9.3* 9.1* 9.3*  HCT 28.1* 26.5* 27.8*  MCV 80.7 80.8 82.2  PLT 199 172 165   Iron/TIBC/Ferritin/ %Sat    Component Value Date/Time   IRON 78 11/28/2016 1546   TIBC 162 (L) 11/28/2016 1546   IRONPCTSAT 48 (H) 11/28/2016 1546

## 2017-03-17 NOTE — Progress Notes (Signed)
Neurology Progress Note  Subjective: He had two seizures yesterday afternoon for which he was given Ativan 2 mg IV and started on Keppra 250 mg daily. No further seizures have been reported. His mentation was better yesterday but he was still very encephalopathic. He is even more alert this morning but appears aphasic, saying "yes" and "ok" to everything I ask or say. He is therefore unable to participate with ROS and the exam is limited. He has been afebrile.   Pertinent meds: Ceftazidime 500 mg q24h Fluconazole 100 mg every 24 hours Heparin 5000 mg q8h, last given today at 0635 Keppra 250 mg IV daily  Current Meds:   Current Facility-Administered Medications:  .  acetaminophen (TYLENOL) tablet 650 mg, 650 mg, Oral, Q6H PRN **OR** acetaminophen (TYLENOL) suppository 650 mg, 650 mg, Rectal, Q6H PRN, Lily Kocher, MD, 650 mg at 03/14/17 2356 .  cefTAZidime (FORTAZ) 500 mg in dextrose 5 % 50 mL IVPB, 500 mg, Intravenous, Q24H, Lyndee Leo, [facility], Last Rate: 100 mL/hr at 03/16/17 1802, 500 mg at 03/16/17 1802 .  chlorhexidine (PERIDEX) 0.12 % solution 15 mL, 15 mL, Mouth Rinse, BID, Eugenie Filler, MD, 15 mL at 03/16/17 2213 .  [START ON 03/19/2017] Darbepoetin Alfa (ARANESP) injection 150 mcg, 150 mcg, Subcutaneous, Q Mon-1800, Roney Jaffe, MD .  dextrose 50 % solution 25 mL, 25 mL, Intravenous, PRN, Veryl Speak, MD, 25 mL at 03/15/17 0818 .  dialysis solution 1.5% low-MG/low-CA dianeal solution, , Intraperitoneal, Q24H, Roney Jaffe, MD .  dialysis solution 2.5% low-MG/low-CA dianeal solution, , Intraperitoneal, Q24H, Roney Jaffe, MD .  fluconazole (DIFLUCAN) IVPB 100 mg, 100 mg, Intravenous, Q24H, Irine Seal V, MD, 100 mg at 03/16/17 1007 .  gentamicin cream (GARAMYCIN) 0.1 % 1 application, 1 application, Topical, Daily, Roney Jaffe, MD .  heparin 2,500 Units in dialysis solution 1.5% low-MG/low-CA 5,000 mL dialysis solution, , Peritoneal Dialysis, PRN, Irine Seal V,  MD .  heparin 2,500 Units in dialysis solution 2.5% low-MG/low-CA 5,000 mL dialysis solution, , Peritoneal Dialysis, PRN, Roney Jaffe, MD .  heparin injection 5,000 Units, 5,000 Units, Subcutaneous, Q8H, Eugenie Filler, MD, 5,000 Units at 03/17/17 [phone]  hydrALAZINE (APRESOLINE) injection 20 mg, 20 mg, Intravenous, Q6H PRN, Lily Kocher, MD, 20 mg at 03/17/17 0701 .  hydrALAZINE (APRESOLINE) injection 5 mg, 5 mg, Intravenous, Once, Eugenie Filler, MD, Stopped at 03/16/17 1530 .  hydrALAZINE (APRESOLINE) injection 5 mg, 5 mg, Intravenous, Once PRN, Ilene Qua Opyd, MD .  insulin aspart (novoLOG) injection 0-9 Units, 0-9 Units, Subcutaneous, TID WC, Eugenie Filler, MD, 2 Units at 03/16/17 1155 .  insulin glargine (LANTUS) injection 8 Units, 8 Units, Subcutaneous, BID, Eugenie Filler, MD, 8 Units at 03/16/17 2213 .  levETIRAcetam (KEPPRA) 250 mg in sodium chloride 0.9 % 100 mL IVPB, 250 mg, Intravenous, Q24H, Darrel Reach, MD, Stopped at 03/16/17 1610 .  LORazepam (ATIVAN) injection 1-2 mg, 1-2 mg, Intravenous, Q2H PRN, Eugenie Filler, MD .  MEDLINE mouth rinse, 15 mL, Mouth Rinse, q12n4p, Eugenie Filler, MD, 15 mL at 03/16/17 1758 .  metoprolol (LOPRESSOR) injection 7.5 mg, 7.5 mg, Intravenous, Q8H, Eugenie Filler, MD, 7.5 mg at 03/17/17 0726 .  ondansetron (ZOFRAN) tablet 4 mg, 4 mg, Oral, Q6H PRN **OR** ondansetron (ZOFRAN) injection 4 mg, 4 mg, Intravenous, Q6H PRN, Lily Kocher, MD .  sodium chloride flush (NS) 0.9 % injection 3 mL, 3 mL, Intravenous, Q12H, Lily Kocher, MD, 3 mL at 03/16/17 2200 .  vancomycin (VANCOCIN) IVPB 750 mg/150 ml premix, 750 mg, Intravenous, Once, Lyndee Leo, RPH  Objective:  Temp:  [97.9 F (36.6 C)-98.3 F (36.8 C)] 98.1 F (36.7 C) (04/21 0400) Pulse Rate:  [77-106] 97 (04/21 0700) Resp:  [5-31] 18 (04/21 0700) BP: (119-178)/(66-135) 178/86 (04/21 0701) SpO2:  [94 %-100 %] 100 % (04/21 0700) Weight:  [74.7 kg (164 lb 11.2  oz)] 74.7 kg (164 lb 11.2 oz) (04/20 0902)  General: WDWN AA male lying in bed. He is very alert and readily attends to me when I enter the room. He is globally aphasic, responding "yes" and "ok" to everything. He is unable to follow simple verbal commands but successfully mimics about 50% of the time. He has a paucity of spontaneous speech but what little he does say is clear. Exam is limited by his aphasia.  during the examination. The examination is therefore limited. HEENT: Neck is supple without lymphadenopathy. MM appear dry, OP clear. Sclerae are anicteric. There is no conjunctival injection.  CV: Regular, 2/6 systolic murmur heard at the left sternal border and apex. No carotid bruits. Distal pulses 2+ and symmetric.  Lungs: CTAB on anterior auscultation. Extremities: No C/C/E. Neuro: MS: As noted above.  CN: Pupils are equal and reactive from 3-2 mm bilaterally. He blinks to visual threat 4. Eyes are conjugate. Volitional saccades are intact in all directions with some breakup of smooth pursuits. No obvious nystagmus is appreciated. Corneals are intact. He appears to have some drooping of the R side of his mouth. His tongue protrudes to midline. The remainder of his cranial nerve examination is deferred as he does not participate with the examination.  Motor: Normal bulk, tone. He moves all 4 extremities but there is the suggestion of possible weakness in the RUE. Confrontational testing is limited by his aphasia. No tremor or other abnormal movements are observed.  DTRs: 2+, symmetric in the arms and at both knees. Ankle jerks are absent. Toes are downgoing bilaterally. No pathological reflexes.  Coordination/gait: Deferred as he is unable to participate with the exam.  Labs: Lab Results  Component Value Date   WBC 6.7 03/16/2017   HGB 9.1 (L) 03/16/2017   HCT 26.5 (L) 03/16/2017   PLT 172 03/16/2017   GLUCOSE 105 (H) 03/16/2017   CHOL 150 03/14/2017   TRIG 37 03/14/2017   HDL 64  03/14/2017   LDLCALC 79 03/14/2017   ALT 18 03/13/2017   AST 23 03/13/2017   NA 138 03/16/2017   K 4.2 03/16/2017   CL 100 (L) 03/16/2017   CREATININE 12.14 (H) 03/16/2017   BUN 68 (H) 03/16/2017   CO2 22 03/16/2017   TSH 2.22 01/04/2017   PSA 353.00 (H) 12/09/2016   INR 1.17 11/24/2016   HGBA1C 8.5 (H) 03/14/2017   CBC Latest Ref Rng & Units 03/16/2017 03/16/2017 03/15/2017  WBC 4.0 - 10.5 K/uL 6.7 8.8 10.0  Hemoglobin 13.0 - 17.0 g/dL 9.1(L) 9.3(L) 9.3(L)  Hematocrit 39.0 - 52.0 % 26.5(L) 28.1(L) 28.0(L)  Platelets 150 - 400 K/uL 172 199 196    Lab Results  Component Value Date   HGBA1C 8.5 (H) 03/14/2017   Lab Results  Component Value Date   ALT 18 03/13/2017   AST 23 03/13/2017   ALKPHOS 49 03/13/2017   BILITOT 0.6 03/13/2017    Radiology:  There is no new neuroimaging for review.   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  A/P:  1. Acute encephalopathy: He is much more alert today but now has a clear global aphasia. This is most likely a multifactorial toxic-metabolic process with contributors including fever, possible infection, severe hyperglycemia with associated hyperosmolality, worsening renal failure and uremia. Initial MRI brain, CTH, and EEG were unrevealing. CNS infection is possible but suspicion remains low. Given his aphasia and possible R face/arm weakness, I will repeat MRI brain to evaluate for possible interval stroke. Consider reattempting lumbar puncture now that he seems less agitated--will check imaging first; will need to hold [place] heparin. Continue to optimize metabolic status as you are. Continue empiric  antibiotics to treat any potential underlying infection. Minimize the use of opiates, benzos or any medication with strong anticholinergic properties as much as possible. Optimize sleep-wake cycles as much as you can by keeping the room bright with activity during the day and dark and quiet at night. For agitation, recommend low-dose haloperidol or an atypical antipsychotic.   2. Seizures: He had two witnessed seizures yesterday, none overnight or this morning. RN described generalized tonic-clonic seizures, one lasting 30 seconds and one lasting 45-60 seconds. He was started on Keppra 250 mg daily, dosed for peritoneal dialysis. Cause of yesterday's seizures unclear. Will repeat MRI brain, consider LP depending upon results of scan. Seizure precautions.   3. Global aphasia: Now that he is more alert and interactive, it is clear that he has a global aphasia. This may have been present all along but clouded by his poor mental status, but cannot exclude the possibility of a new cerebral insult, particularly given yesterday's seizures. SLP to evaluate and treat.   4. R hemiparesis: He has a right facial droop and possible RUE weakness on today's exam. Repeat MRI to exclude new insult as above. PT/OT/rehab as needed.   No family was present at the time of my visit.  Melba Coon, MD [facility]

## 2017-03-17 NOTE — Progress Notes (Signed)
This RN accompanied pt to MRI. Pt restless in scanner, verbal order from Dr. Shon Hale to give PRN ativan that had previously been ordered for seizure activity. VSS. NAD.

## 2017-03-17 NOTE — Progress Notes (Signed)
Paged Dr. Grandville Silos w/ hospitalist team re: pt remaining hypertensive (SBP >180) w/o relief from PRN and scheduled meds. Awaiting reply

## 2017-03-17 NOTE — Progress Notes (Signed)
  Speech Language Pathology Treatment: Dysphagia  Patient Details Name: Jared [name] MRN: [id number] DOB: 12/01/1955 Today's Date: 03/17/2017 Time: 0383-3383 SLP Time Calculation (min) (ACUTE ONLY): 8 min  Assessment / Plan / Recommendation Clinical Impression  Patient lethargic requiring max cues for arousal and to maintain adequate level of arousal for po intake. When alert however and with max cues for initiation awareness of bolus, patient presents with what appears to be good airway protection with trials of pureed solids and thin liquids. No overt s/s of aspiration observed. Unable to fully assess vocal quality as patient largely non-verbal, occasionally grunting with clear phonation. Given fluctuations in mentation, recommend continuation of current diet to maximize po intake as liquids consumed with more swift oral transit than solids. Will continue to f/u.     HPI HPI: 61 year old male who presents with AMS, hyperglycemia (>1000), fevers. MRI was negative for acute infarct but additional w/u is pending. PMH incldues ESRD, diabetes, seizures, HTN, chronic cough, prostate cancer       SLP Plan  Continue with current plan of care       Recommendations  Diet recommendations: Thin liquid Liquids provided via: Straw;Cup Medication Administration: Crushed with puree Supervision: Staff to assist with self feeding;Full supervision/cueing for compensatory strategies Compensations: Minimize environmental distractions;Slow rate;Small sips/bites Postural Changes and/or Swallow Maneuvers: Seated upright 90 degrees                Oral Care Recommendations: Oral care BID Follow up Recommendations: Inpatient Rehab SLP Visit Diagnosis: Dysphagia, unspecified (R13.10) Plan: Continue with current plan of care       [employer] [place], [place] [phone]   [place] 03/17/2017, 10:20 AM

## 2017-03-17 NOTE — Progress Notes (Signed)
Pharmacy Antibiotic Note  Jared [name] is a 61 y.o. male admitted on 03/13/2017 with fever and altered mental status.  Pharmacy has been consulted for vancomycin and fortaz dosing.  Tmax 98.3, wbc normal, noted seizure activity overnight.  Random vancomycin level this morning was 21, will redose patient this afternoon and recheck level early next week. No other antibiotic adjustments warranted at this time. Culture data and respiratory panel have been negative to date.   Maintenance dose calculated to be ~675mg >>will give 750mg  this afternoon.   Dialysis: [facility]  6 exchanges/ 24 hrs, 2500 overnight and 2000 day dwell.  Dwell time 1h 20 min. Dry wt 75.5kg.    Plan: Vancomycin 2g IV load given 4/18, will give maintenance dose of 750mg  x1 this afternoon and plan on checking another VR on 4/24. Continue fortaz 500mg  q24 hours. Continue Fluconazole 100mg  q24 hours Follow up resolution of culture data Continue to follow PD/Renal plans Per ID continue current antibiotics at this time  Height: 5\' 10"  (177.8 cm) Weight: 164 lb 11.2 oz (74.7 kg) IBW/kg (Calculated) : 73  Temp (24hrs), Avg:98.1 F (36.7 C), Min:97.9 F (36.6 C), Max:98.3 F (36.8 C)   Recent Labs Lab 03/13/17 1643 03/13/17 1649 03/14/17 0237 03/15/17 0325 03/16/17 0232 03/16/17 1624 03/17/17 0216  WBC 2.7*  --  7.0 10.0 8.8 6.7  --   CREATININE 9.88*  --  10.89* 12.48* 12.21* 12.14*  --   LATICACIDVEN  --  1.35  --   --   --   --   --   VANCORANDOM  --   --   --   --   --   --  21    Estimated Creatinine Clearance: 6.5 mL/min (A) (by C-G formula based on SCr of 12.14 mg/dL (H)).    Allergies  Allergen Reactions  . Penicillins Other (See Comments)    Has patient had a PCN reaction causing immediate rash, facial/tongue/throat swelling, SOB or lightheadedness with hypotension: Unk Has patient had a PCN reaction causing severe rash involving mucus membranes or skin necrosis: Unk Has patient had a PCN reaction  that required hospitalization: Unk Has patient had a PCN reaction occurring within the last 10 years: Unk If all of the above answers are "NO", then may proceed with Cephalosporin use.     Antimicrobials this admission: Vancomycin 4/18>> Fortaz 4/18>> Fluconazole 4/19>>  Dose adjustments this admission: 4/21 VR 21>>start maintenance dose 750 mg x1  Microbiology results: 4/19 Respiratory PCR>non detected 4/17 peritoneal fluid >ngtd 4/18 urine >ng 4/18 Blood x 2>>ngtd MRSA PCR neg  Thank you for allowing pharmacy to be a part of this patient's care.  Erin Hearing PharmD., BCPS Clinical Pharmacist Pager [phone] 03/17/2017 7:24 AM

## 2017-03-17 NOTE — Progress Notes (Signed)
PROGRESS NOTE    Jared [name]  HEN:[id number] DOB: 24-Aug-1956 DOA: 03/13/2017 PCP: Harland Dingwall, NP-C    Brief Narrative:  Patient is a 61 year old gentleman history of end-stage renal disease on peritoneal dialysis, hypertension, insulin-dependent diabetes mellitus, chronic cough, recent initiation of treatment for prostate cancer and prior seizures in the setting of hypoglycemia presented to the ED with altered mental status changes. Patient noted to have fevers as high as 103. Patient also noted to have blood sugars greater than 1000. Neurology and nephrology consulted.   Assessment & Plan:   Principal Problem:   Acute encephalopathy Active Problems:   Witnessed seizure-like activity (HCC)   Seizure ([place])   CKD (chronic kidney disease) stage V requiring chronic dialysis ([place])   Diabetes mellitus due to underlying condition, uncontrolled, without complication, with long-term current use of insulin (HCC)   Prostate cancer ([place])   Hyperglycemia   Accelerated hypertension   Fever   Weakness   Global aphasia  #1 acute encephalopathy/fever Questionable etiology. Patient more alert and interactive today. Patient noted on presentation to have a blood sugar of greater than 1000 and also noted to have high fevers. MAXIMUM TEMPERATURE of 103.Also concerns for seizures. Fever curve trending down. Patient was placed on the glucose stabilizer and has been transitioned off to Lantus. Blood sugars have improved from admission and arranging from 63-90. MRI MRA head negative for any acute abnormalities. Patient has been pancultured results pending. Peritoneal fluid has been cultured results with no growth to date. Patient more alert today. Fever curve trending down. EEG done 03/14/2017 with diffuse slowing of waking background which is nonspecific, absence of epileptiform discharges. LP was attempted by neurology however due to patient's inability to stay still it was aborted. LP attempted under  fluoroscopy which had to be aborted as well due to agitation despite patient getting Ativan and Haldol. CT abdomen and pelvis with no source of infection noted. Repeat MRI brain pending. Continue empiric IV vancomycin, IV Fortaz, IV Diflucan.. Neurology, ID, nephrology following.   #2 diabetes mellitus uncontrolled and on insulin-dependent diabetes mellitus Blood sugars on admission was 1127. ?? Etiology. Concern for infectious etiology as patient noted to have fevers up to 103. Patient has been pancultured and results pending. Peritoneal fluid has been cultured with no growth to date. Patient was placed on glucose stabilizer and has currently been transitioned to subcutaneous insulin. Patient noted to have a blood glucose of 235 this morning. Increased Lantus to 8 units twice daily. Discontinued D5 normal saline. Follow CBGs and adjust insulin as needed.   #3 malignant hypertension Patient noted on admission to have systolic blood pressures in the 200s. Patient with no significant improvement with IV Lopressor and IV enalapril. Discontinued IV Lopressor and IV enalapril, Nitropaste. Significant improvement of blood pressure on Cardene drip. DiscontinueD Cardene drip. Patient placed back on IV Lopressor 10 mg IV every 8 hours and enalapril 1.25 mg IV every 6.  #4 global aphasia Patient with a global aphasia and answering yes to all questions. Patient also noted to have a right facial droop however unknown whether this is chronic. Patient with some right upper extremity weakness. Patient noted to have seizure-like activity yesterday and currently on Keppra with no further seizures. Repeat MRI brain ordered per neurology to rule out interim acute CVA.  #5 seizure-like activity Patient was noted to have 2 episodes of seizures noted per nursing yesterday 03/16/2017. Patient started on IV Keppra daily. Patient on Ativan when necessary. EEG negative. Repeat MRI brain  pending. Per neurology.  #6 end-stage  renal disease on peroteneal dialysis Nephrology has been consulted. Per nephrology.  #7 history of prostate cancer On Lupron injections. Outpatient follow-up with urology.  #8 anemia Follow H&H.    DVT prophylaxis: Heparin Code Status: Full Family Communication: Updated patient. No family at bedside.  Disposition Plan: Remain the step down unit today. Pending medical improvement. SNF versus home.   Consultants:   Neurology: Dr.Stewart 03/13/2017  Nephrology 03/14/2017  Infectious diseases: Dr. Johnnye Sima 03/15/2017  Procedures:   MRI MRA head 03/14/2017  CT head 03/13/2017  CXR 03/15/2107  CT abdomen and pelvis 03/15/2017  EEG 03/14/2017  Repeat MRI head pending  LP per neurology and on the fluoroscopy attempted however aborted due to increased agitation  Antimicrobials:  IV vancomycin 03/14/2017  IV Fortaz 03/14/2017  IV Diflucan 03/15/2017   Subjective: Patient alert today. Patient interacting. Patient says yes to all questions asked. Per nursing patient moving extremities spontaneously. Patient noted to have seizure-like activity 2 yesterday 03/16/2017 and given IV Keppra and Ativan. No further seizures.  Objective: Vitals:   03/17/17 0800 03/17/17 1009 03/17/17 1015 03/17/17 1045  BP: (!) 158/83 (!) 182/90 (!) 166/103 134/72  Pulse: 84 82    Resp: 16 18 17 17   Temp: 98.7 F (37.1 C) 97.8 F (36.6 C)    TempSrc: Oral Axillary    SpO2: 99%     Weight:      Height:        Intake/Output Summary (Last 24 hours) at 03/17/17 1101 Last data filed at 03/16/17 1300  Gross per 24 hour  Intake                0 ml  Output                0 ml  Net                0 ml   Filed Weights   03/15/17 0500 03/16/17 0500 03/16/17 0902  Weight: 78.3 kg (172 lb 9.6 oz) 75.6 kg (166 lb 9.6 oz) 74.7 kg (164 lb 11.2 oz)    Examination:  General exam: Alert . Respiratory system: Clear to auscultation.Marland Kitchen Respiratory effort normal. Cardiovascular system: S1 & S2  heard, RRR. No JVD, murmurs, rubs, gallops or clicks. No pedal edema. Gastrointestinal system: Abdomen is nondistended, soft and nontender. No organomegaly or masses felt. Normal bowel sounds heard. Central nervous system: Alert. Right facial droop. Right upper extremity weakness Extremities: Moving extremities spontaneously.LUE with avf with thrill, no s/sx of infection. Skin: No rashes, lesions or ulcers Psychiatry: Unable to assess.    Data Reviewed: I have personally reviewed following labs and imaging studies  CBC:  Recent Labs Lab 03/13/17 1643 03/14/17 0237 03/15/17 0325 03/16/17 0232 03/16/17 1624 03/17/17 0824  WBC 2.7* 7.0 10.0 8.8 6.7 6.4  NEUTROABS 1.7  --  7.8* 6.7 5.1 4.5  HGB 8.9* 9.9* 9.3* 9.3* 9.1* 9.3*  HCT 25.9* 29.0* 28.0* 28.1* 26.5* 27.8*  MCV 81.2 79.7 80.7 80.7 80.8 82.2  PLT 158 202 196 199 172 222   Basic Metabolic Panel:  Recent Labs Lab 03/14/17 0237 03/15/17 0325 03/16/17 0232 03/16/17 1624 03/17/17 0824  NA 135 134* 133* 138 138  K 4.6 5.5* 4.6 4.2 4.0  CL 97* 97* 96* 100* 99*  CO2 23 21* 19* 22 23  GLUCOSE 247* 88 322* 105* 238*  BUN 58* 77* 75* 68* 55*  CREATININE 10.89* 12.48* 12.21* 12.14* 10.85*  CALCIUM  8.9 8.7* 8.3* 8.2* 8.3*  MG  --   --   --  1.5*  --   PHOS  --  9.1* 7.6*  --  7.1*   GFR: Estimated Creatinine Clearance: 7.3 mL/min (A) (by C-G formula based on SCr of 10.85 mg/dL (H)). Liver Function Tests:  Recent Labs Lab 03/13/17 1643 03/15/17 0325 03/16/17 0232 03/17/17 0824  AST 23  --   --   --   ALT 18  --   --   --   ALKPHOS 49  --   --   --   BILITOT 0.6  --   --   --   PROT 5.8*  --   --   --   ALBUMIN 2.7* 2.5* 2.3* 2.1*   No results for input(s): LIPASE, AMYLASE in the last 168 hours. No results for input(s): AMMONIA in the last 168 hours. Coagulation Profile: No results for input(s): INR, PROTIME in the last 168 hours. Cardiac Enzymes:  Recent Labs Lab 03/13/17 2349 03/14/17 0237  03/14/17 0825  TROPONINI <0.03 0.04* 0.06*   BNP (last 3 results) No results for input(s): PROBNP in the last 8760 hours. HbA1C: No results for input(s): HGBA1C in the last 72 hours. CBG:  Recent Labs Lab 03/16/17 1512 03/16/17 2212 03/16/17 2356 03/17/17 0342 03/17/17 0748  GLUCAP 98 175* 223* 235* 242*   Lipid Profile: No results for input(s): CHOL, HDL, LDLCALC, TRIG, CHOLHDL, LDLDIRECT in the last 72 hours. Thyroid Function Tests: No results for input(s): TSH, T4TOTAL, FREET4, T3FREE, THYROIDAB in the last 72 hours. Anemia Panel: No results for input(s): VITAMINB12, FOLATE, FERRITIN, TIBC, IRON, RETICCTPCT in the last 72 hours. Sepsis Labs:  Recent Labs Lab 03/13/17 1649  LATICACIDVEN 1.35    Recent Results (from the past 240 hour(s))  Gram stain     Status: None   Collection Time: 03/13/17  8:15 PM  Result Value Ref Range Status   Specimen Description PERITONEAL  Final   Special Requests NONE  Final   Gram Stain   Final    DIRECT SMEAR NO WBC SEEN NO ORGANISMS SEEN RESULT CALLED TO, READ BACK BY AND VERIFIED WITH: M.PETERS,RN 2200 03/13/17 M.CAMPBELL    Report Status 03/13/2017 FINAL  Final  Culture, body fluid-bottle     Status: None (Preliminary result)   Collection Time: 03/13/17  8:15 PM  Result Value Ref Range Status   Specimen Description FLUID PERITONEAL  Final   Special Requests   Final    BOTTLES DRAWN AEROBIC AND ANAEROBIC Blood Culture adequate volume   Culture NO GROWTH 4 DAYS  Final   Report Status PENDING  Incomplete  MRSA PCR Screening     Status: None   Collection Time: 03/14/17  5:18 AM  Result Value Ref Range Status   MRSA by PCR NEGATIVE NEGATIVE Final    Comment:        The GeneXpert MRSA Assay (FDA approved for NASAL specimens only), is one component of a comprehensive MRSA colonization surveillance program. It is not intended to diagnose MRSA infection nor to guide or monitor treatment for MRSA infections.   Culture,  blood (Routine X 2) w Reflex to ID Panel     Status: None (Preliminary result)   Collection Time: 03/14/17  7:51 AM  Result Value Ref Range Status   Specimen Description BLOOD RIGHT HAND  Final   Special Requests   Final    BOTTLES DRAWN AEROBIC ONLY Blood Culture adequate volume   Culture NO  GROWTH 3 DAYS  Final   Report Status PENDING  Incomplete  Culture, blood (Routine X 2) w Reflex to ID Panel     Status: None (Preliminary result)   Collection Time: 03/14/17  8:14 AM  Result Value Ref Range Status   Specimen Description BLOOD RIGHT ASSIST CONTROL  Final   Special Requests IN PEDIATRIC BOTTLE Blood Culture adequate volume  Final   Culture NO GROWTH 3 DAYS  Final   Report Status PENDING  Incomplete  Urine culture     Status: None   Collection Time: 03/14/17  9:20 AM  Result Value Ref Range Status   Specimen Description URINE, RANDOM  Final   Special Requests NONE  Final   Culture NO GROWTH  Final   Report Status 03/15/2017 FINAL  Final  Respiratory Panel by PCR     Status: None   Collection Time: 03/15/17 10:19 AM  Result Value Ref Range Status   Adenovirus NOT DETECTED NOT DETECTED Final   Coronavirus 229E NOT DETECTED NOT DETECTED Final   Coronavirus HKU1 NOT DETECTED NOT DETECTED Final   Coronavirus NL63 NOT DETECTED NOT DETECTED Final   Coronavirus OC43 NOT DETECTED NOT DETECTED Final   Metapneumovirus NOT DETECTED NOT DETECTED Final   Rhinovirus / Enterovirus NOT DETECTED NOT DETECTED Final   Influenza A NOT DETECTED NOT DETECTED Final   Influenza B NOT DETECTED NOT DETECTED Final   Parainfluenza Virus 1 NOT DETECTED NOT DETECTED Final   Parainfluenza Virus 2 NOT DETECTED NOT DETECTED Final   Parainfluenza Virus 3 NOT DETECTED NOT DETECTED Final   Parainfluenza Virus 4 NOT DETECTED NOT DETECTED Final   Respiratory Syncytial Virus NOT DETECTED NOT DETECTED Final   Bordetella pertussis NOT DETECTED NOT DETECTED Final   Chlamydophila pneumoniae NOT DETECTED NOT DETECTED  Final   Mycoplasma pneumoniae NOT DETECTED NOT DETECTED Final         Radiology Studies: Ct Abdomen Pelvis W Contrast  Result Date: 03/15/2017 CLINICAL DATA:  61 year old male, dialysis patient with fever of unknown origin. Initial encounter. EXAM: CT ABDOMEN AND PELVIS WITH CONTRAST TECHNIQUE: Multidetector CT imaging of the abdomen and pelvis was performed using the standard protocol following bolus administration of intravenous contrast. CONTRAST:  100 mL ISOVUE-300 IOPAMIDOL (ISOVUE-300) INJECTION 61% COMPARISON:  CT Abdomen and Pelvis 12/08/2016 FINDINGS: Lower chest: Lower lung volumes. New small layering left pleural fusion. Crowding of markings/mild atelectasis at both lung bases. No pericardial effusion. Hepatobiliary: Negative. Pancreas: Negative. Spleen: Negative. Adrenals/Urinary Tract: Adrenal glands are within normal limits. Both kidneys appear stable, with mildly decreased renal enhancement compared to that expected, but this is unchanged. Right renal parapelvic cyst or mild chronic right hydronephrosis is unchanged. Thick walled urinary bladder re- demonstrated. No gas within the bladder. Stomach/Bowel: Moderate volume of ascites throughout the abdomen similar to the study in January. There is a left abdominal approach peritoneal dialysis catheter. No definite pneumoperitoneum. Decompressed stomach and duodenum. Proximal jejunum is fluid-filled, but there are no significantly dilated small bowel loops. There is a catheter looped in the rectum which has layering fluid. The sigmoid colon is redundant and partially filled with fluid Ing gas. There is fluid in the left colon and at the splenic flexure. Redundant splenic flexure. Redundant transverse colon. Gas at the hepatic flexure. Fluid in the right colon. No abnormally dilated large bowel Vascular/Lymphatic: Major arterial structures in the abdomen and pelvis are patent with minimal atherosclerosis. Portal venous system is patent.  Reproductive: New left scrotal/penile hydrocele with trace gas (  series 3, image 99). The remainder of the visible scrotum appears within normal limits. Other: Little to no pelvic free fluid. Musculoskeletal: Unchanged appearance of multilevel superior endplate compression fractures in the lower thoracic and lumbar spine including T9, T10, L1, L2, L3, and L4. No acute osseous abnormality identified. IMPRESSION: 1. Peritoneal dialysis patient with stable moderate volume of ascites compared to January. There is a new left scrotal/penile hydrocele which also contains trace gas, but this might be related to the peritoneal dialysis. Recommend correlation with scrotal physical exam and scrotal ultrasound as necessary. 2. There is fluid throughout the large bowel which may indicate diarrhea. There is a rectal catheter in place. No focal bowel inflammation or obstruction identified. 3. No other acute or inflammatory process identified in the abdomen or pelvis. 4. New small layering left pleural effusion. 5. Stable multilevel lower thoracic and lumbar compression fractures. Electronically Signed   By: Genevie Ann M.D.   On: 03/15/2017 15:34        Scheduled Meds: . chlorhexidine  15 mL Mouth Rinse BID  . [START ON 03/19/2017] darbepoetin (ARANESP) injection - NON-DIALYSIS  150 mcg Subcutaneous Q Mon-1800  . enalaprilat  1.25 mg Intravenous Q6H  . fluconazole (DIFLUCAN) IV  100 mg Intravenous Q24H  . gentamicin cream  1 application Topical Daily  . heparin subcutaneous  5,000 Units Subcutaneous Q8H  . hydrALAZINE  5 mg Intravenous Once  . insulin aspart  0-9 Units Subcutaneous TID WC  . insulin glargine  8 Units Subcutaneous BID  . mouth rinse  15 mL Mouth Rinse q12n4p  . metoprolol  10 mg Intravenous Q8H  . sodium chloride flush  3 mL Intravenous Q12H   Continuous Infusions: . cefTAZidime (FORTAZ)  IV 500 mg (03/16/17 1802)  . dialysis solution 1.5% low-MG/low-CA    . dialysis solution 2.5% low-MG/low-CA     . levETIRAcetam Stopped (03/16/17 1610)  . magnesium sulfate 1 - 4 g bolus IVPB 4 g (03/17/17 0906)  . vancomycin       LOS: 4 days    Time spent: 34 mins    Bernarr Longsworth, MD [facility] Pager [phone]  If 7PM-7AM, please contact night-coverage [email or web address] Password [email or web address] 03/17/2017, 11:01 AM

## 2017-03-18 LAB — RENAL FUNCTION PANEL
ALBUMIN: 2 g/dL — AB (ref 3.5–5.0)
Anion gap: 14 (ref 5–15)
BUN: 51 mg/dL — AB (ref 6–20)
CALCIUM: 8 mg/dL — AB (ref 8.9–10.3)
CO2: 24 mmol/L (ref 22–32)
CREATININE: 10.39 mg/dL — AB (ref 0.61–1.24)
Chloride: 98 mmol/L — ABNORMAL LOW (ref 101–111)
GFR calc Af Amer: 5 mL/min — ABNORMAL LOW (ref >60)
GFR calc non Af Amer: 5 mL/min — ABNORMAL LOW (ref >60)
GLUCOSE: 77 mg/dL (ref 65–99)
PHOSPHORUS: 7.7 mg/dL — AB (ref 2.5–4.6)
POTASSIUM: 3.8 mmol/L (ref 3.5–5.1)
SODIUM: 136 mmol/L (ref 135–145)

## 2017-03-18 LAB — GLUCOSE, CAPILLARY
GLUCOSE-CAPILLARY: 114 mg/dL — AB (ref 65–99)
GLUCOSE-CAPILLARY: 141 mg/dL — AB (ref 65–99)
GLUCOSE-CAPILLARY: 69 mg/dL (ref 65–99)
Glucose-Capillary: 70 mg/dL (ref 65–99)
Glucose-Capillary: 174 mg/dL — ABNORMAL HIGH (ref 65–99)
Glucose-Capillary: 113 mg/dL — ABNORMAL HIGH (ref 65–99)
Glucose-Capillary: 99 mg/dL (ref 65–99)

## 2017-03-18 LAB — CBC
HCT: 25.4 % — ABNORMAL LOW (ref 39.0–52.0)
Hemoglobin: 8.3 g/dL — ABNORMAL LOW (ref 13.0–17.0)
MCH: 26.6 pg (ref 26.0–34.0)
MCHC: 32.7 g/dL (ref 30.0–36.0)
MCV: 81.4 fL (ref 78.0–100.0)
PLATELETS: 197 10*3/uL (ref 150–400)
RBC: 3.12 MIL/uL — ABNORMAL LOW (ref 4.22–5.81)
RDW: 15.9 % — AB (ref 11.5–15.5)
WBC: 7.1 10*3/uL (ref 4.0–10.5)

## 2017-03-18 LAB — CULTURE, BODY FLUID-BOTTLE: CULTURE: NO GROWTH

## 2017-03-18 LAB — MAGNESIUM: MAGNESIUM: 2.3 mg/dL (ref 1.7–2.4)

## 2017-03-18 LAB — CULTURE, BODY FLUID W GRAM STAIN -BOTTLE: Special Requests: ADEQUATE

## 2017-03-18 MED ORDER — HYDRALAZINE HCL 20 MG/ML IJ SOLN: 10.0000 mg | Freq: Four times a day (QID) | INTRAMUSCULAR | Status: DC | PRN

## 2017-03-18 MED ORDER — AMLODIPINE BESYLATE 10 MG PO TABS
10.0000 mg | ORAL_TABLET | Freq: Every day | ORAL | Status: DC
  Administered 2017-03-18 – 2017-03-19 (×2): 10 mg via ORAL
  Filled 2017-03-18 (×2): qty 1

## 2017-03-18 MED ORDER — MORPHINE SULFATE (PF) 2 MG/ML IV SOLN
1.0000 mg | INTRAVENOUS | Status: DC | PRN
  Administered 2017-03-18: 1 mg via INTRAVENOUS
  Filled 2017-03-18: qty 1

## 2017-03-18 MED ORDER — HYDRALAZINE HCL 20 MG/ML IJ SOLN
10.0000 mg | Freq: Four times a day (QID) | INTRAMUSCULAR | Status: DC | PRN
  Administered 2017-03-18 – 2017-03-19 (×2): 20 mg via INTRAVENOUS
  Filled 2017-03-18 (×3): qty 1

## 2017-03-18 MED ORDER — METOPROLOL TARTRATE 100 MG PO TABS
100.0000 mg | ORAL_TABLET | Freq: Two times a day (BID) | ORAL | Status: DC
  Administered 2017-03-18 – 2017-03-21 (×7): 100 mg via ORAL
  Filled 2017-03-18 (×7): qty 2

## 2017-03-18 MED ORDER — NICARDIPINE HCL IN NACL 20-0.86 MG/200ML-% IV SOLN
3.0000 mg/h | INTRAVENOUS | Status: DC
Start: 2017-03-18 — End: 2017-03-18
  Administered 2017-03-18: 5 mg/h via INTRAVENOUS
  Filled 2017-03-18: qty 200

## 2017-03-18 MED ORDER — HYDRALAZINE HCL 50 MG PO TABS
100.0000 mg | ORAL_TABLET | Freq: Three times a day (TID) | ORAL | Status: DC
  Administered 2017-03-18 – 2017-03-21 (×11): 100 mg via ORAL
  Filled 2017-03-18: qty 1
  Filled 2017-03-18: qty 2
  Filled 2017-03-18: qty 1
  Filled 2017-03-18 (×2): qty 2
  Filled 2017-03-18: qty 1
  Filled 2017-03-18 (×7): qty 2

## 2017-03-18 NOTE — Progress Notes (Signed)
[facility] Progress Note   Subjective: much better today, fully alert, doesn't remember last few days.  "Trump", "2018", not sure where he is.  No c/o's.    Vitals:   03/18/17 1145 03/18/17 1152 03/18/17 1300 03/18/17 1509  BP: (!) 166/80  (!) 170/91 (!) 198/98  Pulse: 88  95   Resp: 18  (!) 25   Temp:  98.4 F (36.9 C)    TempSrc:  Oral    SpO2: 96%  99%   Weight:      Height:        Inpatient medications: . chlorhexidine  15 mL Mouth Rinse BID  . [START ON 03/19/2017] darbepoetin (ARANESP) injection - NON-DIALYSIS  150 mcg Subcutaneous Q Mon-1800  . enalaprilat  1.25 mg Intravenous Q6H  . fluconazole (DIFLUCAN) IV  100 mg Intravenous Q24H  . gentamicin cream  1 application Topical Daily  . heparin subcutaneous  5,000 Units Subcutaneous Q8H  . hydrALAZINE  100 mg Oral TID  . insulin aspart  0-9 Units Subcutaneous TID WC  . insulin glargine  8 Units Subcutaneous BID  . mouth rinse  15 mL Mouth Rinse q12n4p  . metoprolol  10 mg Intravenous Q8H  . sodium chloride flush  3 mL Intravenous Q12H   . cefTAZidime (FORTAZ)  IV Stopped (03/17/17 1910)  . dialysis solution 1.5% low-MG/low-CA    . levETIRAcetam Stopped (03/17/17 1806)  . niCARDipine Stopped (03/18/17 1100)   acetaminophen **OR** acetaminophen, dextrose, dianeal solution for CAPD/CCPD with heparin, dianeal solution for CAPD/CCPD with heparin, dianeal solution for CAPD/CCPD with heparin, hydrALAZINE, LORazepam, morphine injection, ondansetron **OR** ondansetron (ZOFRAN) IV  Exam: Much less agitated and no longer having rigors/ shivering Chest clear bilat RRR tachy no RG Abd soft ntnd PD cath w clean exit site Ext L upper arm AV fistula with +bruit No open wounds on back/ legs or arms Neuro poorly responsive, moves all ext  Dialysis: [facility]  6 exchanges/ 24 hrs, 2500 overnight and 2000 day dwell.  Dwell time 1h 20 min. Dry wt 75.5kg.  Mircera 200 ug given 3/28, next dose 50ug sched for 4/25, Venofer  250 mg scheduled for 4/25       Assessment: 1. High fever/ AMS - improved, MS much better. Prob some viral infection. Not c/w uremia. Will cont [facility]CPD 2. Seizures -started on Keppra IV 3. ESRD - cont CCPD 4. HTN'sive crisis - improved but BP's still on high side. Have resumed home norvasc/ metoprolol, already getting po hydralazine. DC'd all IV bp meds except prn hydral.  5. Hx prostate Ca - on Lupron 6. DM - per primary 7. Anemia of CKD - Hb 8-9.5, last mircera was 3/28. Hold IV fe with active infection. Give darbe next week on Monday 150 ug.  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     Kelly Splinter MD [facility] pager [phone]   03/18/2017, 3:25 PM    Recent Labs Lab 03/16/17 0232 03/16/17 1624 03/17/17 0824 03/18/17 0230  NA 133* 138 138 136  K 4.6 4.2 4.0 3.8  CL 96* 100* 99* 98*  CO2 19* 22 23 24   GLUCOSE 322* 105* 238* 77  BUN 75* 68* 55* 51*  CREATININE 12.21* 12.14* 10.85* 10.39*  CALCIUM 8.3* 8.2* 8.3* 8.0*  PHOS 7.6*  --  7.1* 7.7*    Recent Labs Lab 03/13/17 1643  03/16/17 0232 03/17/17 0824 03/18/17 0230  AST 23  --   --   --   --  ALT 18  --   --   --   --   ALKPHOS 49  --   --   --   --   BILITOT 0.6  --   --   --   --   PROT 5.8*  --   --   --   --   ALBUMIN 2.7*  < > 2.3* 2.1* 2.0*  < > = values in this interval not displayed.  Recent Labs Lab 03/16/17 0232 03/16/17 1624 03/17/17 0824 03/18/17 0229  WBC 8.8 6.7 6.4 7.1  NEUTROABS 6.7 5.1 4.5  --   HGB 9.3* 9.1* 9.3* 8.3*  HCT 28.1* 26.5* 27.8* 25.4*  MCV 80.7 80.8 82.2 81.4  PLT 199 172 165 197   Iron/TIBC/Ferritin/ %Sat    Component Value Date/Time   IRON 78 11/28/2016 1546   TIBC 162 (L) 11/28/2016 1546   IRONPCTSAT 48 (H) 11/28/2016 1546

## 2017-03-18 NOTE — Progress Notes (Signed)
PD machine alarming "Drain interruption". Called tech support number on machine per Dialysis nurse instruction. Straightened drain tubing and restarted drain cycle per tech support prompts. Machine drainage increasing to satisfaction of tech support.

## 2017-03-18 NOTE — Progress Notes (Signed)
PROGRESS NOTE    Jared [name]  WLN:[id number] DOB: March 19, 1956 DOA: 03/13/2017 PCP: Harland Dingwall, NP-C    Brief Narrative:  Patient is a 61 year old gentleman history of end-stage renal disease on peritoneal dialysis, hypertension, insulin-dependent diabetes mellitus, chronic cough, recent initiation of treatment for prostate cancer and prior seizures in the setting of hypoglycemia presented to the ED with altered mental status changes. Patient noted to have fevers as high as 103. Patient also noted to have blood sugars greater than 1000. Neurology and nephrology consulted.   Assessment & Plan:   Principal Problem:   Acute encephalopathy Active Problems:   Witnessed seizure-like activity (HCC)   Seizure ([place])   CKD (chronic kidney disease) stage V requiring chronic dialysis ([place])   Diabetes mellitus due to underlying condition, uncontrolled, without complication, with long-term current use of insulin (HCC)   Prostate cancer ([place])   Hyperglycemia   Accelerated hypertension   Fever   Weakness   Global aphasia   Slurred speech  #1 acute encephalopathy/fever Questionable etiology. Patient more alert and interactive today. Patient noted on presentation to have a blood sugar of greater than 1000 and also noted to have high fevers. MAXIMUM TEMPERATURE of 103.Also concerns for seizures. Fever curve trending down. Patient was placed on the glucose stabilizer and has been transitioned off to Lantus. Blood sugars have improved from admission and arranging from 63-90. MRI MRA head negative for any acute abnormalities. Patient has been pancultured results pending. Peritoneal fluid has been cultured results with no growth to date. Patient more alert today. Fever curve trending down. EEG done 03/14/2017 with diffuse slowing of waking background which is nonspecific, absence of epileptiform discharges. LP was attempted by neurology however due to patient's inability to stay still it was aborted. LP  attempted under fluoroscopy which had to be aborted as well due to agitation despite patient getting Ativan and Haldol. CT abdomen and pelvis with no source of infection noted. Repeat MRI brain limited by motion however no acute abnormalities. Patient improving clinically daily. Patient now with a global aphasia. Patient afebrile. Continue empiric IV vancomycin, IV Fortaz, IV Diflucan.. Neurology, ID, nephrology following.   #2 diabetes mellitus uncontrolled and on insulin-dependent diabetes mellitus Blood sugars on admission was 1127. ?? Etiology. Concern for infectious etiology as patient noted to have fevers up to 103. Patient has been pancultured and results pending. Peritoneal fluid has been cultured with no growth to date. Patient was placed on glucose stabilizer and has currently been transitioned to subcutaneous insulin. Patient noted to have a blood glucose of 70 this morning. Increased Lantus to 8 units twice daily secondary to CBGs in the 200s yesterday. Discontinued D5 normal saline. Follow CBGs and adjust insulin as needed.   #3 malignant hypertension Patient noted on admission to have systolic blood pressures in the 200s. Patient with no significant improvement with IV Lopressor and IV enalapril. Discontinued IV Lopressor and IV enalapril, Nitropaste. Significant improvement of blood pressure on Cardene drip. DiscontinueD Cardene drip. Patient placed back on IV Lopressor 10 mg IV every 8 hours and enalapril 1.25 mg IV every 6 on 03/17/2017. Overnight patient noted to have malignant hypertension with systolic blood pressures in the 200s in place back on a Cardene drip. Will place patient back on home dose oral hydralazine. Will try to wean Cardene drip today.  #4 global aphasia Patient with a global aphasia. Patient also noted to have a right facial droop however unknown whether this is chronic. Patient with some right upper extremity  weakness which is improving. Patient noted to have  seizure-like activity on 03/16/2017, and currently on Keppra with no further seizures. Repeat MRI brain ordered and limited by motion however no acute abnormalities noted. Neurology following.  #5 seizure-like activity Patient was noted to have 2 episodes of seizures noted per nursing on 03/16/2017. Patient started on IV Keppra daily. Patient on Ativan when necessary. EEG negative. Repeat MRI brain limited by motion however no acute abnormalities noted. Per neurology.  #6 end-stage renal disease on peroteneal dialysis Nephrology has been consulted. Per nephrology.  #7 history of prostate cancer On Lupron injections. Outpatient follow-up with urology.  #8 anemia Follow H&H.    DVT prophylaxis: Heparin Code Status: Full Family Communication: Updated patient. No family at bedside.  Disposition Plan: Remain the step down unit today. Pending medical improvement. SNF versus home.   Consultants:   Neurology: Dr.Stewart 03/13/2017  Nephrology 03/14/2017  Infectious diseases: Dr. Johnnye Sima 03/15/2017  Procedures:   MRI MRA head 03/14/2017, 03/17/2017  CT head 03/13/2017  CXR 03/15/2107  CT abdomen and pelvis 03/15/2017  EEG 03/14/2017  Repeat MRI head pending  LP per neurology and on the fluoroscopy attempted however aborted due to increased agitation  Antimicrobials:  IV vancomycin 03/14/2017  IV Fortaz 03/14/2017  IV Diflucan 03/15/2017   Subjective: Patient alert today. Patient interacting. Patient answering some questions however not answering most. Patient noted to have seizure-like activity 2 on 03/16/2017 and given IV Keppra and Ativan. No further seizures. Patient had to be placed back on a Cardizem drip overnight secondary to malignant hypertension.  Objective: Vitals:   03/18/17 0830 03/18/17 0845 03/18/17 0900 03/18/17 0915  BP:  (!) 165/73 (!) 157/70 (!) 153/76  Pulse: 96 94 92 96  Resp: 18 (!) 22 (!) 23 (!) 24  Temp:      TempSrc:      SpO2: 99% 96%  97% 95%  Weight:      Height:        Intake/Output Summary (Last 24 hours) at 03/18/17 1033 Last data filed at 03/18/17 0900  Gross per 24 hour  Intake          1309.33 ml  Output              400 ml  Net           909.33 ml   Filed Weights   03/16/17 0902 03/17/17 1630 03/18/17 0500  Weight: 74.7 kg (164 lb 11.2 oz) 75.8 kg (167 lb 3.2 oz) 74.2 kg (163 lb 8 oz)    Examination:  General exam: Alert . Respiratory system: Clear to auscultation.Marland Kitchen Respiratory effort normal. Cardiovascular system: S1 & S2 heard, RRR. No JVD, murmurs, rubs, gallops or clicks. No pedal edema. Gastrointestinal system: Abdomen is nondistended, soft and nontender. No organomegaly or masses felt. Normal bowel sounds heard. Central nervous system: Alert. Right facial droop. Right upper extremity weakness improved. Extremities: Moving extremities spontaneously.LUE with avf with thrill, no s/sx of infection. Skin: No rashes, lesions or ulcers Psychiatry: Unable to assess.    Data Reviewed: I have personally reviewed following labs and imaging studies  CBC:  Recent Labs Lab 03/13/17 1643  03/15/17 0325 03/16/17 0232 03/16/17 1624 03/17/17 0824 03/18/17 0229  WBC 2.7*  < > 10.0 8.8 6.7 6.4 7.1  NEUTROABS 1.7  --  7.8* 6.7 5.1 4.5  --   HGB 8.9*  < > 9.3* 9.3* 9.1* 9.3* 8.3*  HCT 25.9*  < > 28.0* 28.1* 26.5* 27.8* 25.4*  MCV 81.2  < > 80.7 80.7 80.8 82.2 81.4  PLT 158  < > 196 199 172 165 197  < > = values in this interval not displayed. Basic Metabolic Panel:  Recent Labs Lab 03/15/17 0325 03/16/17 0232 03/16/17 1624 03/17/17 0824 03/18/17 0229 03/18/17 0230  NA 134* 133* 138 138  --  136  K 5.5* 4.6 4.2 4.0  --  3.8  CL 97* 96* 100* 99*  --  98*  CO2 21* 19* 22 23  --  24  GLUCOSE 88 322* 105* 238*  --  77  BUN 77* 75* 68* 55*  --  51*  CREATININE 12.48* 12.21* 12.14* 10.85*  --  10.39*  CALCIUM 8.7* 8.3* 8.2* 8.3*  --  8.0*  MG  --   --  1.5*  --  2.3  --   PHOS 9.1* 7.6*  --   7.1*  --  7.7*   GFR: Estimated Creatinine Clearance: 7.6 mL/min (A) (by C-G formula based on SCr of 10.39 mg/dL (H)). Liver Function Tests:  Recent Labs Lab 03/13/17 1643 03/15/17 0325 03/16/17 0232 03/17/17 0824 03/18/17 0230  AST 23  --   --   --   --   ALT 18  --   --   --   --   ALKPHOS 49  --   --   --   --   BILITOT 0.6  --   --   --   --   PROT 5.8*  --   --   --   --   ALBUMIN 2.7* 2.5* 2.3* 2.1* 2.0*   No results for input(s): LIPASE, AMYLASE in the last 168 hours. No results for input(s): AMMONIA in the last 168 hours. Coagulation Profile: No results for input(s): INR, PROTIME in the last 168 hours. Cardiac Enzymes:  Recent Labs Lab 03/13/17 2349 03/14/17 0237 03/14/17 0825  TROPONINI <0.03 0.04* 0.06*   BNP (last 3 results) No results for input(s): PROBNP in the last 8760 hours. HbA1C: No results for input(s): HGBA1C in the last 72 hours. CBG:  Recent Labs Lab 03/17/17 1958 03/17/17 2321 03/18/17 0322 03/18/17 0804 03/18/17 0939  GLUCAP 116* 117* 70 69 141*   Lipid Profile: No results for input(s): CHOL, HDL, LDLCALC, TRIG, CHOLHDL, LDLDIRECT in the last 72 hours. Thyroid Function Tests: No results for input(s): TSH, T4TOTAL, FREET4, T3FREE, THYROIDAB in the last 72 hours. Anemia Panel: No results for input(s): VITAMINB12, FOLATE, FERRITIN, TIBC, IRON, RETICCTPCT in the last 72 hours. Sepsis Labs:  Recent Labs Lab 03/13/17 1649  LATICACIDVEN 1.35    Recent Results (from the past 240 hour(s))  Gram stain     Status: None   Collection Time: 03/13/17  8:15 PM  Result Value Ref Range Status   Specimen Description PERITONEAL  Final   Special Requests NONE  Final   Gram Stain   Final    DIRECT SMEAR NO WBC SEEN NO ORGANISMS SEEN RESULT CALLED TO, READ BACK BY AND VERIFIED WITH: M.PETERS,RN 2200 03/13/17 M.CAMPBELL    Report Status 03/13/2017 FINAL  Final  Culture, body fluid-bottle     Status: None (Preliminary result)   Collection  Time: 03/13/17  8:15 PM  Result Value Ref Range Status   Specimen Description FLUID PERITONEAL  Final   Special Requests   Final    BOTTLES DRAWN AEROBIC AND ANAEROBIC Blood Culture adequate volume   Culture NO GROWTH 4 DAYS  Final   Report Status PENDING  Incomplete  MRSA PCR Screening     Status: None   Collection Time: 03/14/17  5:18 AM  Result Value Ref Range Status   MRSA by PCR NEGATIVE NEGATIVE Final    Comment:        The GeneXpert MRSA Assay (FDA approved for NASAL specimens only), is one component of a comprehensive MRSA colonization surveillance program. It is not intended to diagnose MRSA infection nor to guide or monitor treatment for MRSA infections.   Culture, blood (Routine X 2) w Reflex to ID Panel     Status: None (Preliminary result)   Collection Time: 03/14/17  7:51 AM  Result Value Ref Range Status   Specimen Description BLOOD RIGHT HAND  Final   Special Requests   Final    BOTTLES DRAWN AEROBIC ONLY Blood Culture adequate volume   Culture NO GROWTH 3 DAYS  Final   Report Status PENDING  Incomplete  Culture, blood (Routine X 2) w Reflex to ID Panel     Status: None (Preliminary result)   Collection Time: 03/14/17  8:14 AM  Result Value Ref Range Status   Specimen Description BLOOD RIGHT ASSIST CONTROL  Final   Special Requests IN PEDIATRIC BOTTLE Blood Culture adequate volume  Final   Culture NO GROWTH 3 DAYS  Final   Report Status PENDING  Incomplete  Urine culture     Status: None   Collection Time: 03/14/17  9:20 AM  Result Value Ref Range Status   Specimen Description URINE, RANDOM  Final   Special Requests NONE  Final   Culture NO GROWTH  Final   Report Status 03/15/2017 FINAL  Final  Respiratory Panel by PCR     Status: None   Collection Time: 03/15/17 10:19 AM  Result Value Ref Range Status   Adenovirus NOT DETECTED NOT DETECTED Final   Coronavirus 229E NOT DETECTED NOT DETECTED Final   Coronavirus HKU1 NOT DETECTED NOT DETECTED Final    Coronavirus NL63 NOT DETECTED NOT DETECTED Final   Coronavirus OC43 NOT DETECTED NOT DETECTED Final   Metapneumovirus NOT DETECTED NOT DETECTED Final   Rhinovirus / Enterovirus NOT DETECTED NOT DETECTED Final   Influenza A NOT DETECTED NOT DETECTED Final   Influenza B NOT DETECTED NOT DETECTED Final   Parainfluenza Virus 1 NOT DETECTED NOT DETECTED Final   Parainfluenza Virus 2 NOT DETECTED NOT DETECTED Final   Parainfluenza Virus 3 NOT DETECTED NOT DETECTED Final   Parainfluenza Virus 4 NOT DETECTED NOT DETECTED Final   Respiratory Syncytial Virus NOT DETECTED NOT DETECTED Final   Bordetella pertussis NOT DETECTED NOT DETECTED Final   Chlamydophila pneumoniae NOT DETECTED NOT DETECTED Final   Mycoplasma pneumoniae NOT DETECTED NOT DETECTED Final         Radiology Studies: Mr Brain 82 Contrast  Result Date: 03/17/2017 CLINICAL DATA:  Seizures and encephalopathy EXAM: MRI HEAD WITHOUT CONTRAST TECHNIQUE: Multiplanar, multiecho pulse sequences of the brain and surrounding structures were obtained without intravenous contrast. COMPARISON:  Brain MRI 03/14/2017 FINDINGS: Despite efforts by the technologist and patient, motion artifact is present on today's examination and could not be eliminated. The findings of this study are interpreted in the context of that limitation. Brain: No focal diffusion restriction to indicate acute infarct. No intraparenchymal hemorrhage. The brain parenchymal signal is normal. No mass lesion or midline shift. No hydrocephalus or extra-axial fluid collection. The midline structures are normal. No age advanced or lobar predominant atrophy. Vascular: Major intracranial arterial and venous sinus flow voids are preserved. No  evidence of chronic microhemorrhage or amyloid angiopathy. Skull and upper cervical spine: The visualized skull base, calvarium, upper cervical spine and extracranial soft tissues are normal. Sinuses/Orbits: No fluid levels or advanced mucosal  thickening. No mastoid effusion. Normal orbits. IMPRESSION: Unchanged MRI of the brain without acute abnormality. Electronically Signed   By: Ulyses Jarred M.D.   On: 03/17/2017 16:32        Scheduled Meds: . chlorhexidine  15 mL Mouth Rinse BID  . [START ON 03/19/2017] darbepoetin (ARANESP) injection - NON-DIALYSIS  150 mcg Subcutaneous Q Mon-1800  . enalaprilat  1.25 mg Intravenous Q6H  . fluconazole (DIFLUCAN) IV  100 mg Intravenous Q24H  . gentamicin cream  1 application Topical Daily  . heparin subcutaneous  5,000 Units Subcutaneous Q8H  . insulin aspart  0-9 Units Subcutaneous TID WC  . insulin glargine  8 Units Subcutaneous BID  . mouth rinse  15 mL Mouth Rinse q12n4p  . metoprolol  10 mg Intravenous Q8H  . sodium chloride flush  3 mL Intravenous Q12H   Continuous Infusions: . cefTAZidime (FORTAZ)  IV Stopped (03/17/17 1910)  . dialysis solution 1.5% low-MG/low-CA    . levETIRAcetam Stopped (03/17/17 1806)  . niCARDipine 5 mg/hr (03/18/17 0900)     LOS: 5 days    Time spent: 82 mins    THOMPSON,DANIEL, MD [facility] Pager [phone]  If 7PM-7AM, please contact night-coverage [email or web address] Password [facility] 03/18/2017, 10:33 AM

## 2017-03-18 NOTE — Progress Notes (Signed)
Neurology Progress Note  Subjective: He has not had any further seizures since 4/20. He has shown steady improvement in his mental status and this morning is at his most alert yet, able to follow commands and more communicative. He is still aphasic which limits ROS and the exam. He states that he feels weak but I am not able to get him to qualify this any further. He denies HA or neck pain. The remainder of his ROS I negative.   Pertinent meds: Ceftazidime 500 mg q24h  Fluconazole 100 mg every 24 hours Heparin 5000 mg q8h, last given today at 0635 Keppra 250 mg IV daily  Current Meds:   Current Facility-Administered Medications:  .  acetaminophen (TYLENOL) tablet 650 mg, 650 mg, Oral, Q6H PRN **OR** acetaminophen (TYLENOL) suppository 650 mg, 650 mg, Rectal, Q6H PRN, Lily Kocher, MD, 650 mg at 03/14/17 2356 .  cefTAZidime (FORTAZ) 500 mg in dextrose 5 % 50 mL IVPB, 500 mg, Intravenous, Q24H, Lyndee Leo, [facility], Stopped at 03/17/17 1910 .  chlorhexidine (PERIDEX) 0.12 % solution 15 mL, 15 mL, Mouth Rinse, BID, Eugenie Filler, MD, 15 mL at 03/17/17 2117 .  [START ON 03/19/2017] Darbepoetin Alfa (ARANESP) injection 150 mcg, 150 mcg, Subcutaneous, Q Mon-1800, Roney Jaffe, MD .  dextrose 50 % solution 25 mL, 25 mL, Intravenous, PRN, Veryl Speak, MD, 25 mL at 03/15/17 0818 .  dialysis solution 1.5% low-MG/low-CA dianeal solution, , Intraperitoneal, Q24H, Irine Seal V, MD .  enalaprilat (VASOTEC) injection 1.25 mg, 1.25 mg, Intravenous, Q6H, Eugenie Filler, MD, 1.25 mg at 03/18/17 0608 .  fluconazole (DIFLUCAN) IVPB 100 mg, 100 mg, Intravenous, Q24H, Irine Seal V, MD, 100 mg at 03/17/17 1100 .  gentamicin cream (GARAMYCIN) 0.1 % 1 application, 1 application, Topical, Daily, Roney Jaffe, MD .  heparin 2,500 Units in dialysis solution 1.5% low-MG/low-CA 5,000 mL dialysis solution, , Peritoneal Dialysis, PRN, Irine Seal V, MD .  heparin 2,500 Units in dialysis solution 1.5%  low-MG/low-CA 5,000 mL dialysis solution, , Peritoneal Dialysis, PRN, Irine Seal V, MD .  heparin 2,500 Units in dialysis solution 2.5% low-MG/low-CA 5,000 mL dialysis solution, , Peritoneal Dialysis, PRN, Roney Jaffe, MD .  heparin injection 5,000 Units, 5,000 Units, Subcutaneous, Q8H, Eugenie Filler, MD, 5,000 Units at 03/18/17 [phone] .  hydrALAZINE (APRESOLINE) injection 20 mg, 20 mg, Intravenous, Q6H PRN, Lily Kocher, MD, 20 mg at 03/18/17 0216 .  hydrALAZINE (APRESOLINE) injection 5 mg, 5 mg, Intravenous, Once, Eugenie Filler, MD, Stopped at 03/16/17 1530 .  insulin aspart (novoLOG) injection 0-9 Units, 0-9 Units, Subcutaneous, TID WC, Eugenie Filler, MD, 3 Units at 03/17/17 [phone] .  insulin glargine (LANTUS) injection 8 Units, 8 Units, Subcutaneous, BID, Eugenie Filler, MD, 8 Units at 03/17/17 2120 .  labetalol (NORMODYNE,TRANDATE) injection 10 mg, 10 mg, Intravenous, Q2H PRN, Vianne Bulls, MD, 10 mg at 03/18/17 0408 .  levETIRAcetam (KEPPRA) 250 mg in sodium chloride 0.9 % 100 mL IVPB, 250 mg, Intravenous, Q24H, Darrel Reach, MD, Stopped at 03/17/17 1806 .  LORazepam (ATIVAN) injection 1-2 mg, 1-2 mg, Intravenous, Q2H PRN, Eugenie Filler, MD, 2 mg at 03/17/17 1545 .  MEDLINE mouth rinse, 15 mL, Mouth Rinse, q12n4p, Eugenie Filler, MD, 15 mL at 03/17/17 1741 .  metoprolol (LOPRESSOR) injection 10 mg, 10 mg, Intravenous, Q8H, Eugenie Filler, MD, 10 mg at 03/18/17 3382 .  morphine 2 MG/ML injection 1 mg, 1 mg, Intravenous, Q2H PRN, Vianne Bulls, MD, 1  mg at 03/18/17 0135 .  nicardipine (CARDENE) 20mg  in 0.86% saline 227ml IV infusion (0.1 mg/ml), 3-15 mg/hr, Intravenous, Continuous, Eugenie Filler, MD, Last Rate: 50 mL/hr at 03/18/17 0726, 5 mg/hr at 03/18/17 0726 .  ondansetron (ZOFRAN) tablet 4 mg, 4 mg, Oral, Q6H PRN **OR** ondansetron (ZOFRAN) injection 4 mg, 4 mg, Intravenous, Q6H PRN, Lily Kocher, MD .  sodium chloride flush (NS) 0.9 % injection 3  mL, 3 mL, Intravenous, Q12H, Lily Kocher, MD, 3 mL at 03/17/17 2120  Objective:  Temp:  [97.5 F (36.4 C)-99 F (37.2 C)] 98.9 F (37.2 C) (04/22 0800) Pulse Rate:  [71-185] 87 (04/22 0745) Resp:  [16-25] 21 (04/22 0745) BP: (134-209)/(72-157) 176/81 (04/22 0745) SpO2:  [86 %-100 %] 96 % (04/22 0745) Weight:  [74.2 kg (163 lb 8 oz)-75.8 kg (167 lb 3.2 oz)] 74.2 kg (163 lb 8 oz) (04/22 0500)  General: WDWN AA male lying in bed. He is alert. He is more conversant today, though still shows evidence of a global aphasia with impaired fluency, comprehension, and repetition. Naming appears to be relatively spared, at least for high frequency objects. Exam is limited by his aphasia.   HEENT: Neck is supple without lymphadenopathy. MM appear dry, OP clear. Sclerae are anicteric. There is no conjunctival injection.  CV: Regular, 2/6 systolic murmur heard at the left sternal border and apex. No carotid bruits. Distal pulses 2+ and symmetric.  Lungs: CTAB on anterior auscultation. Extremities: No C/C/E. Neuro: MS: As noted above.  CN: Pupils are equal and reactive from 3-2 mm bilaterally. He blinks to visual threat 4. Eyes are conjugate. He does not track my finger to command. Volitional saccades are intact in all directions with some breakup of smooth pursuits. No obvious nystagmus is appreciated. Corneals are intact. While he continues to show subtle flattening of the right nasolabial fold at rest he has normal facial mobility and strength bilaterally. Hearing appears to be intact to conversational voice. His uvula is midline. B trapezius is 5/5. His tongue protrudes midline.  Motor: Normal bulk, tone. He gives variable effort with confrontational testing. Strength is 5/5 in BUE. Effort is limited with LE testing but he is at least 4/5. No tremor or other abnormal movements are observed.  DTRs: 2+, symmetric in the arms and at both knees. Ankle jerks are absent. Toes are downgoing bilaterally. No  pathological reflexes.  Coordination: Finger-to-nose is slow but no dysmetria.   Labs: Lab Results  Component Value Date   WBC 7.1 03/18/2017   HGB 8.3 (L) 03/18/2017   HCT 25.4 (L) 03/18/2017   PLT 197 03/18/2017   GLUCOSE 77 03/18/2017   CHOL 150 03/14/2017   TRIG 37 03/14/2017   HDL 64 03/14/2017   LDLCALC 79 03/14/2017   ALT 18 03/13/2017   AST 23 03/13/2017   NA 136 03/18/2017   K 3.8 03/18/2017   CL 98 (L) 03/18/2017   CREATININE 10.39 (H) 03/18/2017   BUN 51 (H) 03/18/2017   CO2 24 03/18/2017   TSH 2.22 01/04/2017   PSA 353.00 (H) 12/09/2016   INR 1.17 11/24/2016   HGBA1C 8.5 (H) 03/14/2017   CBC Latest Ref Rng & Units 03/18/2017 03/17/2017 03/16/2017  WBC 4.0 - 10.5 K/uL 7.1 6.4 6.7  Hemoglobin 13.0 - 17.0 g/dL 8.3(L) 9.3(L) 9.1(L)  Hematocrit 39.0 - 52.0 % 25.4(L) 27.8(L) 26.5(L)  Platelets 150 - 400 K/uL 197 165 172    Lab Results  Component Value Date   HGBA1C 8.5 (H) 03/14/2017  Lab Results  Component Value Date   ALT 18 03/13/2017   AST 23 03/13/2017   ALKPHOS 49 03/13/2017   BILITOT 0.6 03/13/2017    Radiology:  I have personally and independently reviewed the MRI brain without contrast from yesterday. This is limited by movement. There is no restricted diffusion to suggest acute ischemia. There appears to be mild small vessel disease involving the bihemispheric white matter.   Other diagnostic studies:  TTE showed normal LV size and systolic function with EF 60-65%; no regional wall motion abnormalities; grade 1 diastolic dysfunction; mild calcification of aortic valve leaflets; mild calcification of the mitral valve annulus; increased thickness of atrial septum c/w lipomatous hypertrophy  Carotid Dopplers showed no hemodynamically significant stenosis.  A/P:  1. Acute encephalopathy: He is much more alert today but now has a clear global aphasia. This is most likely a multifactorial toxic-metabolic process with contributors including fever,  possible infection, severe hyperglycemia with associated hyperosmolality, worsening renal failure and uremia. On reviewing his chart, he had a similar presentation in December 2017 where he presented with encephalopathy, aphasia, right-sided weakness and seizure in the setting of hyperglycemia (BG 700s at that time). He has a prolonged encephalopathy lasting about five days then cleared, very similar to this hospitalization. Repeat MRI did not show any acute stroke of other obvious acute pathology. I do not suspect CNS infection, particularly given history of an additional nearly identical episode. Will therefore defer LP. Continue to optimize metabolic status as you are. Continue empiric antibiotics to treat any potential underlying infection. Minimize the use of opiates, benzos or any medication with strong anticholinergic properties as much as possible. Optimize sleep-wake cycles as much as you can by keeping the room bright with activity during the day and dark and quiet at night.  2. Seizures: He has not had any further seizures. It is unclear what caused those seizures. Continue Keppra 150 mg daily. Seizure precautions.   3. Global aphasia: His aphasia has improved. Continue to follow. SLP to evaluate and treat.   4. R hemiparesis: No clear weakness on today's exam though effort is somewhat variable. Repeat MRI did not show an acute stroke. Follow exam. PT/OT/rehab as needed.    No family was present at the time of my visit.  Melba Coon, MD [facility]

## 2017-03-18 NOTE — Progress Notes (Signed)
Pt. Right arm swollen and red anterior; warm to touch. MD made aware. RN will continue to monitor and keep arm elevated per MD order.

## 2017-03-18 NOTE — Progress Notes (Signed)
Pt BP remains elevated MD paged. New orders in [facility].

## 2017-03-18 NOTE — Progress Notes (Signed)
MD paged with pt BP. New orders in [facility].

## 2017-03-19 ENCOUNTER — Telehealth: Payer: Self-pay | Admitting: Family Medicine

## 2017-03-19 ENCOUNTER — Inpatient Hospital Stay (HOSPITAL_COMMUNITY): Payer: Medicare Other

## 2017-03-19 ENCOUNTER — Encounter (HOSPITAL_COMMUNITY): Payer: Medicare Other

## 2017-03-19 DIAGNOSIS — M79609 Pain in unspecified limb: Secondary | ICD-10-CM

## 2017-03-19 LAB — CBC WITH DIFFERENTIAL/PLATELET
BASOS ABS: 0 10*3/uL (ref 0.0–0.1)
Basophils Relative: 0 %
EOS ABS: 0.1 10*3/uL (ref 0.0–0.7)
EOS PCT: 2 %
HCT: 26.8 % — ABNORMAL LOW (ref 39.0–52.0)
HEMOGLOBIN: 8.9 g/dL — AB (ref 13.0–17.0)
LYMPHS PCT: 14 %
Lymphs Abs: 1.1 10*3/uL (ref 0.7–4.0)
MCH: 26.9 pg (ref 26.0–34.0)
MCHC: 33.2 g/dL (ref 30.0–36.0)
MCV: 81 fL (ref 78.0–100.0)
Monocytes Absolute: 0.5 10*3/uL (ref 0.1–1.0)
Monocytes Relative: 7 %
Neutro Abs: 5.7 10*3/uL (ref 1.7–7.7)
Neutrophils Relative %: 77 %
PLATELETS: 201 10*3/uL (ref 150–400)
RBC: 3.31 MIL/uL — AB (ref 4.22–5.81)
RDW: 15.6 % — ABNORMAL HIGH (ref 11.5–15.5)
WBC: 7.4 10*3/uL (ref 4.0–10.5)

## 2017-03-19 LAB — CULTURE, BLOOD (ROUTINE X 2)
CULTURE: NO GROWTH
Culture: NO GROWTH
Special Requests: ADEQUATE
Special Requests: ADEQUATE

## 2017-03-19 LAB — GLUCOSE, CAPILLARY
GLUCOSE-CAPILLARY: 80 mg/dL (ref 65–99)
GLUCOSE-CAPILLARY: 134 mg/dL — AB (ref 65–99)
Glucose-Capillary: 137 mg/dL — ABNORMAL HIGH (ref 65–99)
Glucose-Capillary: 158 mg/dL — ABNORMAL HIGH (ref 65–99)
Glucose-Capillary: 101 mg/dL — ABNORMAL HIGH (ref 65–99)
Glucose-Capillary: 189 mg/dL — ABNORMAL HIGH (ref 65–99)

## 2017-03-19 MED ORDER — GI COCKTAIL ~~LOC~~
30.0000 mL | Freq: Three times a day (TID) | ORAL | Status: DC | PRN
  Administered 2017-03-20: 30 mL via ORAL
  Filled 2017-03-19 (×2): qty 30

## 2017-03-19 MED ORDER — PANTOPRAZOLE SODIUM 40 MG PO TBEC
40.0000 mg | DELAYED_RELEASE_TABLET | Freq: Every day | ORAL | Status: DC
  Administered 2017-03-19: 40 mg via ORAL
  Filled 2017-03-19: qty 1

## 2017-03-19 MED ORDER — NICARDIPINE HCL IN NACL 20-0.86 MG/200ML-% IV SOLN
3.0000 mg/h | INTRAVENOUS | Status: DC
  Administered 2017-03-19: 5 mg/h via INTRAVENOUS
  Administered 2017-03-19: 2.5 mg/h via INTRAVENOUS
  Administered 2017-03-19: 5 mg/h via INTRAVENOUS
  Administered 2017-03-20: 2.5 mg/h via INTRAVENOUS
  Filled 2017-03-19 (×4): qty 200

## 2017-03-19 MED ORDER — PANTOPRAZOLE SODIUM 40 MG PO TBEC: 40.0000 mg | DELAYED_RELEASE_TABLET | Freq: Every day | ORAL | Status: DC

## 2017-03-19 MED ORDER — LABETALOL HCL 5 MG/ML IV SOLN
10.0000 mg | INTRAVENOUS | Status: AC | PRN
  Administered 2017-03-19 (×3): 10 mg via INTRAVENOUS
  Filled 2017-03-19 (×3): qty 4

## 2017-03-19 MED ORDER — HYDRALAZINE HCL 20 MG/ML IJ SOLN: 10.0000 mg | Freq: Once | INTRAMUSCULAR | Status: DC

## 2017-03-19 MED ORDER — ISOSORBIDE MONONITRATE ER 30 MG PO TB24
15.0000 mg | ORAL_TABLET | Freq: Every day | ORAL | Status: DC
  Administered 2017-03-19 – 2017-03-21 (×3): 15 mg via ORAL
  Filled 2017-03-19 (×3): qty 1

## 2017-03-19 MED ORDER — NIFEDIPINE ER OSMOTIC RELEASE 90 MG PO TB24
90.0000 mg | ORAL_TABLET | Freq: Every day | ORAL | Status: DC
  Administered 2017-03-19 – 2017-03-21 (×3): 90 mg via ORAL
  Filled 2017-03-19 (×3): qty 1

## 2017-03-19 NOTE — Telephone Encounter (Signed)
Let them know that I appreciate them calling to update me. I have been following him in the computer. Let's wait and see what the discharge recommendations are from the doctors who are seeing him while he is in the hospital and we will go from there. I'm glad to hear that he is improving.

## 2017-03-19 NOTE — Progress Notes (Signed)
*  Preliminary Results* Right upper extremity venous duplex completed. Right upper extremity is negative for deep and superficial vein thrombosis.  03/19/2017 11:50 AM  Maudry Mayhew, BS, RVT, RDCS, RDMS

## 2017-03-19 NOTE — Progress Notes (Signed)
Physical Therapy Treatment Patient Details Name: Jared [name] MRN: [id number] DOB: 1956/08/15 Today's Date: 03/19/2017    History of Present Illness Pt is a 61 y.o. male admitted to ED on 03/13/17 with AMS, hyperglycemia (>1000), and fevers. CT and MRI negative for acute infarct; additional workup is pending. EEG 4/18 abnormal with diffuse slowing of waking background. Most recent seizure-like activity x2 on 4/20. Pertinent PMH includes ESRD (on home dialysis), DM, seizures, HTN, chronic cough, prostate CA.    PT Comments    Pt progressing with mobility; increased alertness and interaction with treatment today. Continues to demonstrate aphasia (?). Able to amb to bathroom with minA for balance due to RLE instability and increased lateral sway; amb back to bed with RW and minA for balance, improved stability. Demonstrates decreased awareness with short-term memory issues (??). Motivated to work with PT. Goals updated based on progress. Will continue to follow acutely.   Follow Up Recommendations  CIR;Supervision/Assistance - 24 hour     Equipment Recommendations  Other (comment) (TBD)    Recommendations for Other Services Rehab consult     Precautions / Restrictions Precautions Precautions: Fall Restrictions Weight Bearing Restrictions: No    Mobility  Bed Mobility Overal bed mobility: Needs Assistance Bed Mobility: Supine to Sit     Supine to sit: Min guard     General bed mobility comments: Sat with min guard for safety, required no physical assist.  Transfers Overall transfer level: Needs assistance Equipment used: None Transfers: Sit to/from Stand Sit to Stand: Min assist         General transfer comment: MinA for trunk support into standing.  Ambulation/Gait Ambulation/Gait assistance: Min assist;+2 safety/equipment Ambulation Distance (Feet): 15 Feet (15) Assistive device: None;Rolling walker (2 wheeled) Gait Pattern/deviations: Step-through  pattern;Decreased weight shift to right;Staggering right;Decreased stride length     General Gait Details: Amb to bathroom with no AD and minA for balance; increased lateral sway with RLE instability, requiring minA for balance; scissoring with RLE, with potential proprioceptive issues (??). Amb from bathroom to bed with RW and min guard for balance with RLE instability; required verbal cues for hand placement with RW.    Stairs            Wheelchair Mobility    Modified Rankin (Stroke Patients Only)       Balance Overall balance assessment: Needs assistance Sitting-balance support: No upper extremity supported;Feet supported Sitting balance-Leahy Scale: Fair Sitting balance - Comments: Able to maintain sitting balance with min guard.      Standing balance-Leahy Scale: Fair Standing balance comment: Able to stand with no UE support with min guard for balance.                             Cognition Arousal/Alertness: Awake/alert Behavior During Therapy: WFL for tasks assessed/performed Overall Cognitive Status: Impaired/Different from baseline Area of Impairment: Attention;Following commands;Memory;Orientation;Safety/judgement;Awareness                 Orientation Level: Disoriented to;Time Current Attention Level: Selective Memory: Decreased short-term memory Following Commands: Follows one step commands consistently Safety/Judgement: Decreased awareness of safety;Decreased awareness of deficits Awareness: Emergent   General Comments: Pt much more alert and interactive today, conversing appropriately and following one-step commands. When asked birthday, he was able to give month/day, but stated "5" as the year, unable to state correct year (1957) despite max cues; initially stated "5" as today's date, able to state April, repeated "April"  when asked day and year.       Exercises      General Comments        Pertinent Vitals/Pain Pain Assessment:  Faces Faces Pain Scale: Hurts little more Pain Location: RUE Pain Descriptors / Indicators: Discomfort Pain Intervention(s): Monitored during session;Limited activity within patient's tolerance    Home Living                      Prior Function            PT Goals (current goals can now be found in the care plan section) Acute Rehab PT Goals Patient Stated Goal: Return home PT Goal Formulation: With patient Time For Goal Achievement: 04/02/17 Potential to Achieve Goals: Good Progress towards PT goals: Progressing toward goals    Frequency    Min 3X/week      PT Plan Current plan remains appropriate    Co-evaluation             End of Session Equipment Utilized During Treatment: Gait belt Activity Tolerance: Patient tolerated treatment well Patient left: in bed;with call bell/phone within reach;with bed alarm set;with SCD's reapplied Nurse Communication: Mobility status PT Visit Diagnosis: Other abnormalities of gait and mobility (R26.89)     Time: 3014-9969 PT Time Calculation (min) (ACUTE ONLY): 26 min  Charges:  $Therapeutic Activity: 23-37 mins                    G Codes:      [name], SPT Office-[phone]  [name] 03/19/2017, 12:51 PM

## 2017-03-19 NOTE — Progress Notes (Signed)
PROGRESS NOTE    Jared [name]  ZSW:[id number] DOB: 11-16-1956 DOA: 03/13/2017 PCP: Harland Dingwall, NP-C    Brief Narrative:  Patient is a 61 year old gentleman history of end-stage renal disease on peritoneal dialysis, hypertension, insulin-dependent diabetes mellitus, chronic cough, recent initiation of treatment for prostate cancer and prior seizures in the setting of hypoglycemia presented to the ED with altered mental status changes. Patient noted to have fevers as high as 103. Patient also noted to have blood sugars greater than 1000. Neurology and nephrology consulted.   Assessment & Plan:   Principal Problem:   Acute encephalopathy Active Problems:   Witnessed seizure-like activity (HCC)   Seizure ([place])   CKD (chronic kidney disease) stage V requiring chronic dialysis ([place])   Diabetes mellitus due to underlying condition, uncontrolled, without complication, with long-term current use of insulin (HCC)   Prostate cancer ([place])   Hyperglycemia   Accelerated hypertension   Fever   Weakness   Global aphasia   Slurred speech  #1 acute encephalopathy/fever Questionable etiology. Patient more alert and interactive today. Patient noted on presentation to have a blood sugar of greater than 1000 and also noted to have high fevers. MAXIMUM TEMPERATURE of 103.Also concerns for seizures. Fever curve trending down. Patient was placed on the glucose stabilizer and has been transitioned off to Lantus. Blood sugars have improved from admission and arranging from 63-90. MRI MRA head negative for any acute abnormalities. Patient has been pancultured results pending. Peritoneal fluid has been cultured results with no growth to date. Patient more alert today. Fever curve trending down. EEG done 03/14/2017 with diffuse slowing of waking background which is nonspecific, absence of epileptiform discharges. LP was attempted by neurology however due to patient's inability to stay still it was aborted. LP  attempted under fluoroscopy which had to be aborted as well due to agitation despite patient getting Ativan and Haldol. CT abdomen and pelvis with no source of infection noted. Repeat MRI brain limited by motion however no acute abnormalities. Patient improving clinically daily. Patient now with a improving global aphasia. Patient afebrile. Continue empiric IV Fortaz, IV Diflucan. D/C IV Vancomycin. Neurology, ID, nephrology following.   #2 diabetes mellitus uncontrolled and on insulin-dependent diabetes mellitus Blood sugars on admission was 1127. ?? Etiology. Concern for infectious etiology as patient noted to have fevers up to 103. Patient has been pancultured and results pending. Peritoneal fluid has been cultured with no growth to date. Patient was placed on glucose stabilizer and has currently been transitioned to subcutaneous insulin. Patient noted to have a blood glucose of 80 -137.Continue Lantus to 8 units twice daily. Discontinued D5 normal saline. Follow CBGs and adjust insulin as needed.   #3 malignant hypertension Patient noted on admission to have systolic blood pressures in the 200s. Patient with no significant improvement with IV Lopressor and IV enalapril. Discontinued IV Lopressor and IV enalapril, Nitropaste. Significant improvement of blood pressure on Cardene drip earlier on which was discontinued. Patient was placed on oral antihypertensive medications. Systolic blood pressures in the 170s to the 200s overnight and this morning and currently at 227/79 despite oral regimen of Norvasc, Lopressor, hydralazine, and addition of Imdur. Will discontinue Norvasc. Place on a Cardene drip and titrate for goal systolic blood pressure less than 160.   #4 global aphasia Patient with a global aphasia which is slowly improving. Patient also noted to have a right facial droop however unknown whether this is chronic. Patient with some right upper extremity weakness which is improving. Patient  noted to  have seizure-like activity on 03/16/2017, and currently on Keppra with no further seizures. Repeat MRI brain ordered and limited by motion however no acute abnormalities noted. Neurology following.  #5 seizure-like activity Patient was noted to have 2 episodes of seizures noted per nursing on 03/16/2017. Patient started on IV Keppra daily. Patient on Ativan when necessary. EEG negative. Repeat MRI brain limited by motion however no acute abnormalities noted. Continue current dose of Keppra. Per neurology.  #6 end-stage renal disease on peroteneal dialysis Nephrology has been consulted. Per nephrology.  #7 history of prostate cancer On Lupron injections. Outpatient follow-up with urology.  #8 anemia Follow H&H.    DVT prophylaxis: Heparin Code Status: Full Family Communication: Updated patient. No family at bedside.  Disposition Plan: Remain the step down unit today as patient with systolic blood pressures in the 200s. Pending medical improvement. SNF versus home.   Consultants:   Neurology: Dr.Stewart 03/13/2017  Nephrology 03/14/2017  Infectious diseases: Dr. Johnnye Sima 03/15/2017  Procedures:   MRI MRA head 03/14/2017, 03/17/2017  CT head 03/13/2017  CXR 03/15/2107  CT abdomen and pelvis 03/15/2017  EEG 03/14/2017  Repeat MRI head pending  LP per neurology and on the fluoroscopy attempted however aborted due to increased agitation  Antimicrobials:  IV vancomycin 03/14/2017>>>>> 03/19/2017  IV Fortaz 03/14/2017  IV Diflucan 03/15/2017   Subjective: Patient alert today. Patient interacting. Patient answering some questions. Patient noted to have seizure-like activity 2 on 03/16/2017 and given IV Keppra and Ativan. No further seizures. Patient taken off Cardene drip yesterday and placed on oral antihypertensive medications however patient with significantly elevated blood pressures overnight and this morning with systolic blood pressure ranging from the 170s to the  220s. Current blood pressure 227/79.   Objective: Vitals:   03/19/17 0500 03/19/17 0800 03/19/17 0851 03/19/17 1000  BP: (!) 178/76 (!) 170/77  (!) 207/74  Pulse: 82 86 87 81  Resp: 17 (!) 21  17  Temp:      TempSrc:      SpO2: 96% 99%  100%  Weight:  76.3 kg (168 lb 3.2 oz)    Height:        Intake/Output Summary (Last 24 hours) at 03/19/17 1027 Last data filed at 03/19/17 0859  Gross per 24 hour  Intake                3 ml  Output              100 ml  Net              -97 ml   Filed Weights   03/17/17 1630 03/18/17 0500 03/19/17 0800  Weight: 75.8 kg (167 lb 3.2 oz) 74.2 kg (163 lb 8 oz) 76.3 kg (168 lb 3.2 oz)    Examination:  General exam: Alert . Respiratory system: Clear to auscultation.Marland Kitchen Respiratory effort normal. Cardiovascular system: S1 & S2 heard, RRR. No JVD, murmurs, rubs, gallops or clicks. No pedal edema. Gastrointestinal system: Abdomen is nondistended, soft and nontender. No organomegaly or masses felt. Normal bowel sounds heard. Central nervous system: Alert. Right facial droop. Right upper extremity weakness improved. Extremities: Moving extremities spontaneously.LUE with avf with thrill, no s/sx of infection.RUE with swelling around elbow, with warmth, erythema. Skin: No rashes, lesions or ulcers Psychiatry: Unable to assess.    Data Reviewed: I have personally reviewed following labs and imaging studies  CBC:  Recent Labs Lab 03/15/17 0325 03/16/17 0232 03/16/17 1624 03/17/17 0824 03/18/17 0229 03/19/17  0250  WBC 10.0 8.8 6.7 6.4 7.1 7.4  NEUTROABS 7.8* 6.7 5.1 4.5  --  5.7  HGB 9.3* 9.3* 9.1* 9.3* 8.3* 8.9*  HCT 28.0* 28.1* 26.5* 27.8* 25.4* 26.8*  MCV 80.7 80.7 80.8 82.2 81.4 81.0  PLT 196 199 172 165 197 952   Basic Metabolic Panel:  Recent Labs Lab 03/15/17 0325 03/16/17 0232 03/16/17 1624 03/17/17 0824 03/18/17 0229 03/18/17 0230  NA 134* 133* 138 138  --  136  K 5.5* 4.6 4.2 4.0  --  3.8  CL 97* 96* 100* 99*  --  98*    CO2 21* 19* 22 23  --  24  GLUCOSE 88 322* 105* 238*  --  77  BUN 77* 75* 68* 55*  --  51*  CREATININE 12.48* 12.21* 12.14* 10.85*  --  10.39*  CALCIUM 8.7* 8.3* 8.2* 8.3*  --  8.0*  MG  --   --  1.5*  --  2.3  --   PHOS 9.1* 7.6*  --  7.1*  --  7.7*   GFR: Estimated Creatinine Clearance: 7.6 mL/min (A) (by C-G formula based on SCr of 10.39 mg/dL (H)). Liver Function Tests:  Recent Labs Lab 03/13/17 1643 03/15/17 0325 03/16/17 0232 03/17/17 0824 03/18/17 0230  AST 23  --   --   --   --   ALT 18  --   --   --   --   ALKPHOS 49  --   --   --   --   BILITOT 0.6  --   --   --   --   PROT 5.8*  --   --   --   --   ALBUMIN 2.7* 2.5* 2.3* 2.1* 2.0*   No results for input(s): LIPASE, AMYLASE in the last 168 hours. No results for input(s): AMMONIA in the last 168 hours. Coagulation Profile: No results for input(s): INR, PROTIME in the last 168 hours. Cardiac Enzymes:  Recent Labs Lab 03/13/17 2349 03/14/17 0237 03/14/17 0825  TROPONINI <0.03 0.04* 0.06*   BNP (last 3 results) No results for input(s): PROBNP in the last 8760 hours. HbA1C: No results for input(s): HGBA1C in the last 72 hours. CBG:  Recent Labs Lab 03/18/17 1646 03/18/17 2009 03/18/17 2354 03/19/17 0320 03/19/17 0915  GLUCAP 113* 99 114* 80 137*   Lipid Profile: No results for input(s): CHOL, HDL, LDLCALC, TRIG, CHOLHDL, LDLDIRECT in the last 72 hours. Thyroid Function Tests: No results for input(s): TSH, T4TOTAL, FREET4, T3FREE, THYROIDAB in the last 72 hours. Anemia Panel: No results for input(s): VITAMINB12, FOLATE, FERRITIN, TIBC, IRON, RETICCTPCT in the last 72 hours. Sepsis Labs:  Recent Labs Lab 03/13/17 1649  LATICACIDVEN 1.35    Recent Results (from the past 240 hour(s))  Gram stain     Status: None   Collection Time: 03/13/17  8:15 PM  Result Value Ref Range Status   Specimen Description PERITONEAL  Final   Special Requests NONE  Final   Gram Stain   Final    DIRECT SMEAR NO  WBC SEEN NO ORGANISMS SEEN RESULT CALLED TO, READ BACK BY AND VERIFIED WITH: M.PETERS,RN 2200 03/13/17 M.CAMPBELL    Report Status 03/13/2017 FINAL  Final  Culture, body fluid-bottle     Status: None   Collection Time: 03/13/17  8:15 PM  Result Value Ref Range Status   Specimen Description FLUID PERITONEAL  Final   Special Requests   Final    BOTTLES DRAWN AEROBIC AND  ANAEROBIC Blood Culture adequate volume   Culture NO GROWTH 5 DAYS  Final   Report Status 03/18/2017 FINAL  Final  MRSA PCR Screening     Status: None   Collection Time: 03/14/17  5:18 AM  Result Value Ref Range Status   MRSA by PCR NEGATIVE NEGATIVE Final    Comment:        The GeneXpert MRSA Assay (FDA approved for NASAL specimens only), is one component of a comprehensive MRSA colonization surveillance program. It is not intended to diagnose MRSA infection nor to guide or monitor treatment for MRSA infections.   Culture, blood (Routine X 2) w Reflex to ID Panel     Status: None (Preliminary result)   Collection Time: 03/14/17  7:51 AM  Result Value Ref Range Status   Specimen Description BLOOD RIGHT HAND  Final   Special Requests   Final    BOTTLES DRAWN AEROBIC ONLY Blood Culture adequate volume   Culture NO GROWTH 4 DAYS  Final   Report Status PENDING  Incomplete  Culture, blood (Routine X 2) w Reflex to ID Panel     Status: None (Preliminary result)   Collection Time: 03/14/17  8:14 AM  Result Value Ref Range Status   Specimen Description BLOOD RIGHT ASSIST CONTROL  Final   Special Requests IN PEDIATRIC BOTTLE Blood Culture adequate volume  Final   Culture NO GROWTH 4 DAYS  Final   Report Status PENDING  Incomplete  Urine culture     Status: None   Collection Time: 03/14/17  9:20 AM  Result Value Ref Range Status   Specimen Description URINE, RANDOM  Final   Special Requests NONE  Final   Culture NO GROWTH  Final   Report Status 03/15/2017 FINAL  Final  Respiratory Panel by PCR     Status: None    Collection Time: 03/15/17 10:19 AM  Result Value Ref Range Status   Adenovirus NOT DETECTED NOT DETECTED Final   Coronavirus 229E NOT DETECTED NOT DETECTED Final   Coronavirus HKU1 NOT DETECTED NOT DETECTED Final   Coronavirus NL63 NOT DETECTED NOT DETECTED Final   Coronavirus OC43 NOT DETECTED NOT DETECTED Final   Metapneumovirus NOT DETECTED NOT DETECTED Final   Rhinovirus / Enterovirus NOT DETECTED NOT DETECTED Final   Influenza A NOT DETECTED NOT DETECTED Final   Influenza B NOT DETECTED NOT DETECTED Final   Parainfluenza Virus 1 NOT DETECTED NOT DETECTED Final   Parainfluenza Virus 2 NOT DETECTED NOT DETECTED Final   Parainfluenza Virus 3 NOT DETECTED NOT DETECTED Final   Parainfluenza Virus 4 NOT DETECTED NOT DETECTED Final   Respiratory Syncytial Virus NOT DETECTED NOT DETECTED Final   Bordetella pertussis NOT DETECTED NOT DETECTED Final   Chlamydophila pneumoniae NOT DETECTED NOT DETECTED Final   Mycoplasma pneumoniae NOT DETECTED NOT DETECTED Final         Radiology Studies: Mr Brain 58 Contrast  Result Date: 03/17/2017 CLINICAL DATA:  Seizures and encephalopathy EXAM: MRI HEAD WITHOUT CONTRAST TECHNIQUE: Multiplanar, multiecho pulse sequences of the brain and surrounding structures were obtained without intravenous contrast. COMPARISON:  Brain MRI 03/14/2017 FINDINGS: Despite efforts by the technologist and patient, motion artifact is present on today's examination and could not be eliminated. The findings of this study are interpreted in the context of that limitation. Brain: No focal diffusion restriction to indicate acute infarct. No intraparenchymal hemorrhage. The brain parenchymal signal is normal. No mass lesion or midline shift. No hydrocephalus or extra-axial fluid collection. The  midline structures are normal. No age advanced or lobar predominant atrophy. Vascular: Major intracranial arterial and venous sinus flow voids are preserved. No evidence of chronic  microhemorrhage or amyloid angiopathy. Skull and upper cervical spine: The visualized skull base, calvarium, upper cervical spine and extracranial soft tissues are normal. Sinuses/Orbits: No fluid levels or advanced mucosal thickening. No mastoid effusion. Normal orbits. IMPRESSION: Unchanged MRI of the brain without acute abnormality. Electronically Signed   By: Ulyses Jarred M.D.   On: 03/17/2017 16:32        Scheduled Meds: . chlorhexidine  15 mL Mouth Rinse BID  . darbepoetin (ARANESP) injection - NON-DIALYSIS  150 mcg Subcutaneous Q Mon-1800  . fluconazole (DIFLUCAN) IV  100 mg Intravenous Q24H  . gentamicin cream  1 application Topical Daily  . heparin subcutaneous  5,000 Units Subcutaneous Q8H  . hydrALAZINE  100 mg Oral TID  . insulin aspart  0-9 Units Subcutaneous TID WC  . insulin glargine  8 Units Subcutaneous BID  . isosorbide mononitrate  15 mg Oral Daily  . mouth rinse  15 mL Mouth Rinse q12n4p  . metoprolol tartrate  100 mg Oral BID  . sodium chloride flush  3 mL Intravenous Q12H   Continuous Infusions: . cefTAZidime (FORTAZ)  IV Stopped (03/18/17 1827)  . dialysis solution 1.5% low-MG/low-CA    . levETIRAcetam Stopped (03/18/17 1639)  . niCARDipine       LOS: 6 days    Time spent: 12 mins    Karsyn Rochin, MD [facility] Pager [phone]  If 7PM-7AM, please contact night-coverage [email or web address] Password [facility] 03/19/2017, 10:27 AM

## 2017-03-19 NOTE — Progress Notes (Signed)
[place] MD paged with SYS BP 190-200s does not want to restart the IV PRN meds DC'd today.  Lamar Blinks NP paged-new orders per [facility]. Wants these to hold pt until morning.

## 2017-03-19 NOTE — Telephone Encounter (Signed)
pts sister husband called and wanted to let you know that he was in the hospital and that he was admitted for unexplained for seizures, states he is stabilized now, and when he has the seizures his blood sugars go to where you can not measure them, and his blood pressure goes through the roof, the family wants to know when he gets to come home if he should go to a specialist to see what is causing the seizures, he states that it is not a stroke, and he also wanted to let you know that he is having swelling in his right arm and they are de terming if it is a blood clot, he I informed him that we share the same system, he just wanted me to see what your thoughts where, Fray sister can be reached at [phone] and she is on her hippa,

## 2017-03-19 NOTE — Progress Notes (Signed)
EEG completed, results pending. 

## 2017-03-19 NOTE — Progress Notes (Signed)
  Speech Language Pathology Treatment: Dysphagia;Cognitive-Linquistic  Patient Details Name: Jared [name] MRN: [id number] DOB: 20-Jul-1956 Today's Date: 03/19/2017 Time: 0998-3382 SLP Time Calculation (min) (ACUTE ONLY): 15 min  Assessment / Plan / Recommendation Clinical Impression  Pt is much more alert today and more communicative as well, although he is still expressively aphasic. He stated his name and when given binary choice he was able to appropriately select what month it was. He remained disoriented to location and situation. Prolonged bolus preparation and moderate lingual residuals were observed with advanced trials of soft solids. Thin liquid washes assist in clearing oral residue, but he has limited emergent awareness and tries to talk with food still in his mouth, resulting in coughing episode x1. Recommend diet advancement to Dys 2 textures, continuing thin liquids with full supervision.   HPI HPI: 61 year old male who presents with AMS, hyperglycemia (>1000), fevers. MRI was negative for acute infarct but additional w/u is pending. PMH incldues ESRD, diabetes, seizures, HTN, chronic cough, prostate cancer       SLP Plan  Continue with current plan of care       Recommendations  Diet recommendations: Dysphagia 2 (fine chop);Thin liquid Liquids provided via: Straw;Cup Medication Administration: Whole meds with liquid Supervision: Staff to assist with self feeding;Full supervision/cueing for compensatory strategies Compensations: Minimize environmental distractions;Slow rate;Small sips/bites Postural Changes and/or Swallow Maneuvers: Seated upright 90 degrees                Oral Care Recommendations: Oral care BID Follow up Recommendations: Inpatient Rehab SLP Visit Diagnosis: Dysphagia, unspecified (R13.10);Aphasia (R47.01) Plan: Continue with current plan of care       GO                [name] 03/19/2017, 11:24 AM  [name], M.A.  CCC-SLP [phone]

## 2017-03-19 NOTE — Progress Notes (Signed)
INFECTIOUS DISEASE PROGRESS NOTE  ID: Jared [name] is a 61 y.o. male with  Principal Problem:   Acute encephalopathy Active Problems:   Seizure ([place])   CKD (chronic kidney disease) stage V requiring chronic dialysis ([place])   Diabetes mellitus due to underlying condition, uncontrolled, without complication, with long-term current use of insulin (HCC)   Prostate cancer (HCC)   Hyperglycemia   Accelerated hypertension   Fever   Weakness   Witnessed seizure-like activity (HCC)   Global aphasia   Slurred speech  Subjective: Without complaints  Abtx:  Anti-infectives    Start     Dose/Rate Route Frequency Ordered Stop   03/17/17 1200  vancomycin (VANCOCIN) IVPB 750 mg/150 ml premix     750 mg 150 mL/hr over 60 Minutes Intravenous  Once 03/17/17 0742 03/17/17 1343   03/16/17 1000  fluconazole (DIFLUCAN) IVPB 100 mg  Status:  Discontinued     100 mg 50 mL/hr over 60 Minutes Intravenous Every 24 hours 03/15/17 1143 03/19/17 1047   03/15/17 1800  cefTAZidime (FORTAZ) 500 mg in dextrose 5 % 50 mL IVPB  Status:  Discontinued     500 mg 100 mL/hr over 30 Minutes Intravenous Every 24 hours 03/14/17 1344 03/19/17 1047   03/15/17 1230  fluconazole (DIFLUCAN) IVPB 200 mg     200 mg 100 mL/hr over 60 Minutes Intravenous  Once 03/15/17 1143 03/15/17 1333   03/14/17 1330  cefTRIAXone (ROCEPHIN) 2 g in dextrose 5 % 50 mL IVPB  Status:  Discontinued     2 g 100 mL/hr over 30 Minutes Intravenous Every 12 hours 03/14/17 1325 03/14/17 1436   03/14/17 1300  vancomycin (VANCOCIN) 2,000 mg in sodium chloride 0.9 % 500 mL IVPB     2,000 mg 250 mL/hr over 120 Minutes Intravenous  Once 03/14/17 1231 03/14/17 1531   03/14/17 1300  cefTAZidime (FORTAZ) 1 g in dextrose 5 % 50 mL IVPB     1 g 100 mL/hr over 30 Minutes Intravenous  Once 03/14/17 1231 03/14/17 1408      Medications:  Scheduled: . darbepoetin (ARANESP) injection - NON-DIALYSIS  150 mcg Subcutaneous Q Tue-1800  . gentamicin cream  1  application Topical Daily  . heparin subcutaneous  5,000 Units Subcutaneous Q8H  . hydrALAZINE  100 mg Oral TID  . insulin aspart  0-9 Units Subcutaneous TID WC  . insulin glargine  10 Units Subcutaneous BID  . isosorbide mononitrate  15 mg Oral Daily  . metoprolol tartrate  100 mg Oral BID  . NIFEdipine  90 mg Oral Daily  . pantoprazole  40 mg Oral Q0600  . sodium chloride flush  3 mL Intravenous Q12H    Objective: Vital signs in last 24 hours: Temp:  [98.1 F (36.7 C)-99.1 F (37.3 C)] 98.5 F (36.9 C) (04/23 1200) Pulse Rate:  [76-91] 83 (04/23 1145) Resp:  [2-28] 2 (04/23 1145) BP: (159-229)/(54-112) 159/75 (04/23 1145) SpO2:  [95 %-100 %] 96 % (04/23 1145) Weight:  [76.3 kg (168 lb 3.2 oz)] 76.3 kg (168 lb 3.2 oz) (04/23 0800)   General appearance: alert, cooperative and no distress Resp: clear to auscultation bilaterally Cardio: regular rate and rhythm GI: normal findings: bowel sounds normal, soft, non-tender and distended  Lab Results  Recent Labs  03/17/17 0824 03/18/17 0229 03/18/17 0230 03/19/17 0250  WBC 6.4 7.1  --  7.4  HGB 9.3* 8.3*  --  8.9*  HCT 27.8* 25.4*  --  26.8*  NA 138  --  136  --   K 4.0  --  3.8  --   CL 99*  --  98*  --   CO2 23  --  24  --   BUN 55*  --  51*  --   CREATININE 10.85*  --  10.39*  --    Liver Panel  Recent Labs  03/17/17 0824 03/18/17 0230  ALBUMIN 2.1* 2.0*   Sedimentation Rate No results for input(s): ESRSEDRATE in the last 72 hours. C-Reactive Protein No results for input(s): CRP in the last 72 hours.  Microbiology: Recent Results (from the past 240 hour(s))  Gram stain     Status: None   Collection Time: 03/13/17  8:15 PM  Result Value Ref Range Status   Specimen Description PERITONEAL  Final   Special Requests NONE  Final   Gram Stain   Final    DIRECT SMEAR NO WBC SEEN NO ORGANISMS SEEN RESULT CALLED TO, READ BACK BY AND VERIFIED WITH: M.PETERS,RN 2200 03/13/17 M.CAMPBELL    Report Status  03/13/2017 FINAL  Final  Culture, body fluid-bottle     Status: None   Collection Time: 03/13/17  8:15 PM  Result Value Ref Range Status   Specimen Description FLUID PERITONEAL  Final   Special Requests   Final    BOTTLES DRAWN AEROBIC AND ANAEROBIC Blood Culture adequate volume   Culture NO GROWTH 5 DAYS  Final   Report Status 03/18/2017 FINAL  Final  MRSA PCR Screening     Status: None   Collection Time: 03/14/17  5:18 AM  Result Value Ref Range Status   MRSA by PCR NEGATIVE NEGATIVE Final    Comment:        The GeneXpert MRSA Assay (FDA approved for NASAL specimens only), is one component of a comprehensive MRSA colonization surveillance program. It is not intended to diagnose MRSA infection nor to guide or monitor treatment for MRSA infections.   Culture, blood (Routine X 2) w Reflex to ID Panel     Status: None   Collection Time: 03/14/17  7:51 AM  Result Value Ref Range Status   Specimen Description BLOOD RIGHT HAND  Final   Special Requests   Final    BOTTLES DRAWN AEROBIC ONLY Blood Culture adequate volume   Culture NO GROWTH 5 DAYS  Final   Report Status 03/19/2017 FINAL  Final  Culture, blood (Routine X 2) w Reflex to ID Panel     Status: None   Collection Time: 03/14/17  8:14 AM  Result Value Ref Range Status   Specimen Description BLOOD RIGHT ASSIST CONTROL  Final   Special Requests IN PEDIATRIC BOTTLE Blood Culture adequate volume  Final   Culture NO GROWTH 5 DAYS  Final   Report Status 03/19/2017 FINAL  Final  Urine culture     Status: None   Collection Time: 03/14/17  9:20 AM  Result Value Ref Range Status   Specimen Description URINE, RANDOM  Final   Special Requests NONE  Final   Culture NO GROWTH  Final   Report Status 03/15/2017 FINAL  Final  Respiratory Panel by PCR     Status: None   Collection Time: 03/15/17 10:19 AM  Result Value Ref Range Status   Adenovirus NOT DETECTED NOT DETECTED Final   Coronavirus 229E NOT DETECTED NOT DETECTED Final    Coronavirus HKU1 NOT DETECTED NOT DETECTED Final   Coronavirus NL63 NOT DETECTED NOT DETECTED Final   Coronavirus OC43 NOT DETECTED NOT DETECTED Final  Metapneumovirus NOT DETECTED NOT DETECTED Final   Rhinovirus / Enterovirus NOT DETECTED NOT DETECTED Final   Influenza A NOT DETECTED NOT DETECTED Final   Influenza B NOT DETECTED NOT DETECTED Final   Parainfluenza Virus 1 NOT DETECTED NOT DETECTED Final   Parainfluenza Virus 2 NOT DETECTED NOT DETECTED Final   Parainfluenza Virus 3 NOT DETECTED NOT DETECTED Final   Parainfluenza Virus 4 NOT DETECTED NOT DETECTED Final   Respiratory Syncytial Virus NOT DETECTED NOT DETECTED Final   Bordetella pertussis NOT DETECTED NOT DETECTED Final   Chlamydophila pneumoniae NOT DETECTED NOT DETECTED Final   Mycoplasma pneumoniae NOT DETECTED NOT DETECTED Final  Culture, blood (Routine X 2) w Reflex to ID Panel     Status: None (Preliminary result)   Collection Time: 03/19/17 11:00 AM  Result Value Ref Range Status   Specimen Description BLOOD RIGHT HAND  Final   Special Requests   Final    BOTTLES DRAWN AEROBIC ONLY Blood Culture adequate volume   Culture NO GROWTH < 12 HOURS  Final   Report Status PENDING  Incomplete    Studies/Results: Mr Brain Wo Contrast  Result Date: 03/17/2017 CLINICAL DATA:  Seizures and encephalopathy EXAM: MRI HEAD WITHOUT CONTRAST TECHNIQUE: Multiplanar, multiecho pulse sequences of the brain and surrounding structures were obtained without intravenous contrast. COMPARISON:  Brain MRI 03/14/2017 FINDINGS: Despite efforts by the technologist and patient, motion artifact is present on today's examination and could not be eliminated. The findings of this study are interpreted in the context of that limitation. Brain: No focal diffusion restriction to indicate acute infarct. No intraparenchymal hemorrhage. The brain parenchymal signal is normal. No mass lesion or midline shift. No hydrocephalus or extra-axial fluid collection.  The midline structures are normal. No age advanced or lobar predominant atrophy. Vascular: Major intracranial arterial and venous sinus flow voids are preserved. No evidence of chronic microhemorrhage or amyloid angiopathy. Skull and upper cervical spine: The visualized skull base, calvarium, upper cervical spine and extracranial soft tissues are normal. Sinuses/Orbits: No fluid levels or advanced mucosal thickening. No mastoid effusion. Normal orbits. IMPRESSION: Unchanged MRI of the brain without acute abnormality. Electronically Signed   By: Ulyses Jarred M.D.   On: 03/17/2017 16:32     Assessment/Plan: Fever  Peritoneal dialysis  Total days of antibiotics: 5 vanco/ceftaz, flucon         cx have been negative Will stop anbx and watch his temp  Bobby Rumpf Infectious Diseases (pager) [phone] [email or web address] 03/19/2017, 2:31 PM  LOS: 6 days

## 2017-03-19 NOTE — Procedures (Signed)
History: 61 year old male with seizures  Sedation: None  Technique: This is a 21 channel routine scalp EEG performed at the bedside with bipolar and monopolar montages arranged in accordance to the international 10/20 system of electrode placement. One channel was dedicated to EKG recording.    Background: The background consists of intermixed alpha and beta activities. There is a well defined posterior dominant rhythm of 10 Hz that attenuates with eye opening.   Photic stimulation: Physiologic driving is not performed  EEG Abnormalities: None  Clinical Interpretation: This normal EEG is recorded in the waking and drowsy state. There was no seizure or seizure predisposition recorded on this study. Please note that a normal EEG does not preclude the possibility of epilepsy.   [name], MD [facility] [phone]  If 7pm- 7am, please page neurology on call as listed in [place].

## 2017-03-19 NOTE — Progress Notes (Signed)
Patient ID: Jared [name], male   DOB: [birth year], 61 y.o.   MRN: [id number]  [facility] Progress Note   Assessment/ Plan:   1. Fever/altered mental status: Suggestive of metabolic encephalopathy associated with an unidentified (possibly viral) infection given sepsis. Mental status now 2. ESRD: Until new current prescription of CCPD-may likely need to adjust prescription as an outpatient to address issue of adequacy. 3. Anemia: Hemoglobin low, continue ESA 4. CKD-MBD: Hyperphosphatemia noted with acceptable calcium level, restart phosphorus binder 5. Hypertension: Intermittently elevated blood pressures noted even after resuming his outpatient antihypertensive therapy with metoprolol and hydralazine- on nicardipine drip (amlodipine discontinued yesterday)-start overlapping nifedipine today to allow weaning off of nicardipine.  Subjective:   Complains of intermittent right leg pain and right arm pain    Objective:   BP (!) 207/74 Comment: scheduled bp med given. md aware   Pulse 81   Temp 98.1 F (36.7 C) (Oral)   Resp 17   Ht 5\' 10"  (1.778 m)   Wt 76.3 kg (168 lb 3.2 oz)   SpO2 100%   BMI 24.13 kg/m   Physical Exam: [id number] resting in bed CVS: Pulse regular rhythm, normal rate Resp: Clear to auscultation, no rales Abd: Soft, flat, PD catheter in situ Ext: No lower extremity edema  Labs: BMET  Recent Labs Lab 03/13/17 1643 03/14/17 0237 03/15/17 0325 03/16/17 0232 03/16/17 1624 03/17/17 0824 03/18/17 0230  NA 122* 135 134* 133* 138 138 136  K 5.6* 4.6 5.5* 4.6 4.2 4.0 3.8  CL 87* 97* 97* 96* 100* 99* 98*  CO2 20* 23 21* 19* 22 23 24   GLUCOSE 1,127* 247* 88 322* 105* 238* 77  BUN 55* 58* 77* 75* 68* 55* 51*  CREATININE 9.88* 10.89* 12.48* 12.21* 12.14* 10.85* 10.39*  CALCIUM 8.1* 8.9 8.7* 8.3* 8.2* 8.3* 8.0*  PHOS  --   --  9.1* 7.6*  --  7.1* 7.7*   CBC  Recent Labs Lab 03/16/17 0232 03/16/17 1624 03/17/17 0824 03/18/17 0229  03/19/17 0250  WBC 8.8 6.7 6.4 7.1 7.4  NEUTROABS 6.7 5.1 4.5  --  5.7  HGB 9.3* 9.1* 9.3* 8.3* 8.9*  HCT 28.1* 26.5* 27.8* 25.4* 26.8*  MCV 80.7 80.8 82.2 81.4 81.0  PLT 199 172 165 197 201   Medications:    . chlorhexidine  15 mL Mouth Rinse BID  . darbepoetin (ARANESP) injection - NON-DIALYSIS  150 mcg Subcutaneous Q Mon-1800  . gentamicin cream  1 application Topical Daily  . heparin subcutaneous  5,000 Units Subcutaneous Q8H  . hydrALAZINE  100 mg Oral TID  . insulin aspart  0-9 Units Subcutaneous TID WC  . insulin glargine  8 Units Subcutaneous BID  . isosorbide mononitrate  15 mg Oral Daily  . mouth rinse  15 mL Mouth Rinse q12n4p  . metoprolol tartrate  100 mg Oral BID  . sodium chloride flush  3 mL Intravenous Q12H   Elmarie Shiley, MD 03/19/2017, 11:37 AM

## 2017-03-19 NOTE — Progress Notes (Signed)
Subjective: Continues to improve.   Exam: Vitals:   03/19/17 0800 03/19/17 0851  BP: (!) 170/77   Pulse: 86 87  Resp: (!) 21   Temp:     Gen: In bed, NAD Resp: non-labored breathing, no acute distress Abd: soft, nt  Neuro: MS: Awake, alert, gives month as may, does not answer for year. Follows commands though with some difficulty, appears slightly aphasic.   CN: PERRL, VFF Motor: moves all extremities with good strength bilaterally.  Sensory: endorses symmetric sensation.   Pertinent Labs: Elevated creatinine  Impression: 61 yo M with focal seizure int eh setting of severe hyperosmolar state(glucose > 1100). He appears to be making progress and I suspect that this was a provoked seizure again. I will repeat EEG and continue keppra for now, but may not need it long term.   Recommendations: 1) repeat EEG 2) continue keppra 250mg  BID 3) will continue to follow.   [name], MD [facility] [phone]  If 7pm- 7am, please page neurology on call as listed in [place].

## 2017-03-19 NOTE — Progress Notes (Signed)
RN received call from Charleston Poot with [facility] [employer]. Contact number is [phone]. She report that she is the RN that coordinate Pt. Home peritoneal dialysis. She inquired about if pt. Has been educated on how to set up hemodialysis as well as the family. RN spoke with family and pt. And they report that he has been doing PD for 2 years and his sisters all are educated on how to set up the machine. RN will notify and pass this information on to be followed.

## 2017-03-19 NOTE — Telephone Encounter (Signed)
Called and infomred pt sister of the information, told her to call us if they needed anything,

## 2017-03-20 DIAGNOSIS — R509 Fever, unspecified: Secondary | ICD-10-CM

## 2017-03-20 DIAGNOSIS — E46 Unspecified protein-calorie malnutrition: Secondary | ICD-10-CM

## 2017-03-20 LAB — GLUCOSE, CAPILLARY
GLUCOSE-CAPILLARY: 281 mg/dL — AB (ref 65–99)
GLUCOSE-CAPILLARY: 133 mg/dL — AB (ref 65–99)
GLUCOSE-CAPILLARY: 105 mg/dL — AB (ref 65–99)
GLUCOSE-CAPILLARY: 120 mg/dL — AB (ref 65–99)
Glucose-Capillary: 237 mg/dL — ABNORMAL HIGH (ref 65–99)
Glucose-Capillary: 37 mg/dL — CL (ref 65–99)
Glucose-Capillary: 46 mg/dL — ABNORMAL LOW (ref 65–99)
Glucose-Capillary: 94 mg/dL (ref 65–99)
Glucose-Capillary: 135 mg/dL — ABNORMAL HIGH (ref 65–99)
Glucose-Capillary: 195 mg/dL — ABNORMAL HIGH (ref 65–99)

## 2017-03-20 LAB — RENAL FUNCTION PANEL
Albumin: 1.8 g/dL — ABNORMAL LOW (ref 3.5–5.0)
Anion gap: 13 (ref 5–15)
BUN: 46 mg/dL — AB (ref 6–20)
CHLORIDE: 93 mmol/L — AB (ref 101–111)
CO2: 26 mmol/L (ref 22–32)
CREATININE: 9.29 mg/dL — AB (ref 0.61–1.24)
Calcium: 8 mg/dL — ABNORMAL LOW (ref 8.9–10.3)
GFR calc Af Amer: 6 mL/min — ABNORMAL LOW (ref >60)
GFR calc non Af Amer: 5 mL/min — ABNORMAL LOW (ref >60)
Glucose, Bld: 239 mg/dL — ABNORMAL HIGH (ref 65–99)
Phosphorus: 8 mg/dL — ABNORMAL HIGH (ref 2.5–4.6)
Potassium: 4.1 mmol/L (ref 3.5–5.1)
SODIUM: 132 mmol/L — AB (ref 135–145)

## 2017-03-20 LAB — CBC WITH DIFFERENTIAL/PLATELET
BASOS ABS: 0 10*3/uL (ref 0.0–0.1)
Basophils Relative: 0 %
EOS ABS: 0.1 10*3/uL (ref 0.0–0.7)
EOS PCT: 2 %
HCT: 26.3 % — ABNORMAL LOW (ref 39.0–52.0)
Hemoglobin: 8.7 g/dL — ABNORMAL LOW (ref 13.0–17.0)
LYMPHS PCT: 12 %
Lymphs Abs: 0.9 10*3/uL (ref 0.7–4.0)
MCH: 26.7 pg (ref 26.0–34.0)
MCHC: 33.1 g/dL (ref 30.0–36.0)
MCV: 80.7 fL (ref 78.0–100.0)
MONO ABS: 0.6 10*3/uL (ref 0.1–1.0)
Monocytes Relative: 7 %
Neutro Abs: 6 10*3/uL (ref 1.7–7.7)
Neutrophils Relative %: 79 %
Platelets: 252 10*3/uL (ref 150–400)
RBC: 3.26 MIL/uL — ABNORMAL LOW (ref 4.22–5.81)
RDW: 15.3 % (ref 11.5–15.5)
WBC: 7.5 10*3/uL (ref 4.0–10.5)

## 2017-03-20 MED ORDER — INSULIN GLARGINE 100 UNIT/ML ~~LOC~~ SOLN
8.0000 [IU] | Freq: Two times a day (BID) | SUBCUTANEOUS | Status: DC
  Administered 2017-03-21 (×2): 8 [IU] via SUBCUTANEOUS
  Filled 2017-03-20 (×4): qty 0.08

## 2017-03-20 MED ORDER — PNEUMOCOCCAL VAC POLYVALENT 25 MCG/0.5ML IJ INJ
0.5000 mL | INJECTION | INTRAMUSCULAR | Status: DC
  Filled 2017-03-20: qty 0.5

## 2017-03-20 MED ORDER — LEVETIRACETAM 250 MG PO TABS: 250.0000 mg | ORAL_TABLET | Freq: Two times a day (BID) | ORAL | 0 refills | Status: DC

## 2017-03-20 MED ORDER — INSULIN GLARGINE 100 UNIT/ML ~~LOC~~ SOLN
10.0000 [IU] | Freq: Two times a day (BID) | SUBCUTANEOUS | Status: DC
  Administered 2017-03-20: 10 [IU] via SUBCUTANEOUS
  Filled 2017-03-20 (×2): qty 0.1

## 2017-03-20 MED ORDER — PANTOPRAZOLE SODIUM 40 MG PO TBEC: 40.0000 mg | DELAYED_RELEASE_TABLET | Freq: Every day | ORAL | 1 refills | Status: DC

## 2017-03-20 MED ORDER — NIFEDIPINE ER OSMOTIC RELEASE 90 MG PO TB24: 90.0000 mg | ORAL_TABLET | Freq: Every day | ORAL | 2 refills | Status: DC

## 2017-03-20 MED ORDER — ISOSORBIDE MONONITRATE ER 30 MG PO TB24: 15.0000 mg | ORAL_TABLET | Freq: Every day | ORAL | 1 refills | Status: DC

## 2017-03-20 MED ORDER — DARBEPOETIN ALFA 150 MCG/0.3ML IJ SOSY
150.0000 ug | PREFILLED_SYRINGE | INTRAMUSCULAR | Status: DC
  Filled 2017-03-20: qty 0.3

## 2017-03-20 MED ORDER — LEVETIRACETAM 250 MG PO TABS
250.0000 mg | ORAL_TABLET | Freq: Two times a day (BID) | ORAL | Status: DC
  Administered 2017-03-20 – 2017-03-21 (×2): 250 mg via ORAL
  Filled 2017-03-20 (×2): qty 1

## 2017-03-20 NOTE — Progress Notes (Signed)
Patient did well with therapy and may have progressed well to be able to go home with sister and family to provide care at this level. Ambulated to bathroom with therapy without assistive device and used walker for balance at times. Need to clarify with sister his prior level of function. [phone]

## 2017-03-20 NOTE — Progress Notes (Signed)
Patient ID: Jared [name], male   DOB: [birth year], 61 y.o.   MRN: [id number]  [facility] Progress Note   Assessment/ Plan:   1. Fever/altered mental status: Suggestive of metabolic encephalopathy associated with an unidentified (possibly viral) infection given sepsis. Mental status now at baseline 2. ESRD: Continue current prescription of CCPD-adequacy data reviewed. Will send RN to ensure he is making sterile connections and following aseptic technique prior to DC 3. 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 Hypertension: Intermittently elevated blood pressures noted --off cardene gtt and now on nifedipine XL (may switch back to amlodipine as OP)  Subjective:   Complains of intermittent right leg pain--overall feels well and wants to go home    Objective:   BP (!) 144/59   Pulse 85   Temp 98 F (36.7 C) (Oral)   Resp 17   Ht 5\' 10"  (1.778 m)   Wt 76.6 kg (168 lb 12.8 oz)   SpO2 96%   BMI 24.22 kg/m   Physical Exam: [id number] resting in bed, family at bedside CVS: Pulse regular rhythm, normal rate Resp: Clear to auscultation, no rales Abd: Soft, flat, PD catheter in situ Ext: No lower extremity edema  Labs: BMET  Recent Labs Lab 03/14/17 0237 03/15/17 0325 03/16/17 0232 03/16/17 1624 03/17/17 0824 03/18/17 0230 03/20/17 0341  NA 135 134* 133* 138 138 136 132*  K 4.6 5.5* 4.6 4.2 4.0 3.8 4.1  CL 97* 97* 96* 100* 99* 98* 93*  CO2 23 21* 19* 22 23 24 26   GLUCOSE 247* 88 322* 105* 238* 77 239*  BUN 58* 77* 75* 68* 55* 51* 46*  CREATININE 10.89* 12.48* 12.21* 12.14* 10.85* 10.39* 9.29*  CALCIUM 8.9 8.7* 8.3* 8.2* 8.3* 8.0* 8.0*  PHOS  --  9.1* 7.6*  --  7.1* 7.7* 8.0*   CBC  Recent Labs Lab 03/16/17 1624 03/17/17 0824 03/18/17 0229 03/19/17 0250 03/20/17 0341  WBC 6.7 6.4 7.1 7.4 7.5  NEUTROABS 5.1 4.5  --  5.7 6.0  HGB 9.1* 9.3* 8.3* 8.9*  8.7*  HCT 26.5* 27.8* 25.4* 26.8* 26.3*  MCV 80.8 82.2 81.4 81.0 80.7  PLT 172 165 197 201 252   Medications:    . darbepoetin (ARANESP) injection - NON-DIALYSIS  150 mcg Subcutaneous Q Tue-1800  . gentamicin cream  1 application Topical Daily  . heparin subcutaneous  5,000 Units Subcutaneous Q8H  . hydrALAZINE  100 mg Oral TID  . insulin aspart  0-9 Units Subcutaneous TID WC  . insulin glargine  10 Units Subcutaneous BID  . isosorbide mononitrate  15 mg Oral Daily  . metoprolol tartrate  100 mg Oral BID  . NIFEdipine  90 mg Oral Daily  . pantoprazole  40 mg Oral Q0600  . sodium chloride flush  3 mL Intravenous Q12H   Elmarie Shiley, MD 03/20/2017, 10:15 AM

## 2017-03-20 NOTE — Progress Notes (Signed)
Received phone call from Darlin Coco, patient's home nurse from [facility].  (Phone [phone])  She states pt has to be checked off that he is able to use his home peritoneal dialysis machine prior to dc.  Notified her that pt being evaluated for inpatient rehab admission today.  She states she will notify Dr. Lorrene Reid to speak with pt's attending to assure that he is aware of this.    Reinaldo Raddle, RN, BSN  Trauma/Neuro ICU Case Manager [phone]

## 2017-03-20 NOTE — Progress Notes (Signed)
  Speech Language Pathology Treatment: Dysphagia;Cognitive-Linquistic  Patient Details Name: Jared [name] MRN: [id number] DOB: [birth year] Today's Date: 03/20/2017 Time: 5300-5110 SLP Time Calculation (min) (ACUTE ONLY): 23 min  Assessment / Plan / Recommendation Clinical Impression  Pt remains expressively aphasic but is following one-step commands well during functional tasks. He is oriented to person and location today but needs binary choice for orientation to time. Anomia is noted during spontaneous conversation, requiring Mod cueing.   SLP also provided skilled observation during breakfast meal. Pt had an episode of facial grimacing followed by coughing, at which time pt started to describe "acid reflux". He also describes the sensation of the food "trickling" down - suspect some element of likely baseline esophageal issues, potentially exacerbated during acute hospitalization. SLP provided Mod cues for use of aspiration and esophageal precautions with no further overt s/s of aspiration observed. Recommend to continue current diet with softer textures to facilitate oral preparation and esophageal clearance.   HPI HPI: 61 year old male who presents with AMS, hyperglycemia (>1000), fevers. MRI was negative for acute infarct but additional w/u is pending. PMH incldues ESRD, diabetes, seizures, HTN, chronic cough, prostate cancer       SLP Plan  Continue with current plan of care       Recommendations  Diet recommendations: Dysphagia 2 (fine chop);Thin liquid Liquids provided via: Straw;Cup Medication Administration: Whole meds with liquid Supervision: Staff to assist with self feeding;Full supervision/cueing for compensatory strategies Compensations: Minimize environmental distractions;Slow rate;Small sips/bites Postural Changes and/or Swallow Maneuvers: Seated upright 90 degrees;Upright 30-60 min after meal                Oral Care Recommendations: Oral care BID Follow up  Recommendations: Inpatient Rehab SLP Visit Diagnosis: Dysphagia, unspecified (R13.10);Aphasia (R47.01) Plan: Continue with current plan of care       GO                [name] 03/20/2017, 9:57 AM  [name], M.A. CCC-SLP [phone]

## 2017-03-20 NOTE — Progress Notes (Addendum)
PROGRESS NOTE    Jared [name]  UJW:[id number] DOB: 26-Jul-1956 DOA: 03/13/2017 PCP: Harland Dingwall, NP-C    Brief Narrative:  Patient is a 61 year old gentleman history of end-stage renal disease on peritoneal dialysis, hypertension, insulin-dependent diabetes mellitus, chronic cough, recent initiation of treatment for prostate cancer and prior seizures in the setting of hypoglycemia presented to the ED with altered mental status changes. Patient noted to have fevers as high as 103. Patient also noted to have blood sugars greater than 1000. Neurology and nephrology consulted.   Assessment & Plan:   Principal Problem:   Acute encephalopathy Active Problems:   Witnessed seizure-like activity (HCC)   Seizure ([place])   CKD (chronic kidney disease) stage V requiring chronic dialysis ([place])   Diabetes mellitus due to underlying condition, uncontrolled, without complication, with long-term current use of insulin (HCC)   Prostate cancer (HCC)   Hyperglycemia   Accelerated hypertension   Fever   Weakness   Global aphasia   Slurred speech   FUO (fever of unknown origin)  #1 acute metabolic encephalopathy/fever Questionable etiology. Patient more alert and interactive today. Patient noted on presentation to have a blood sugar of greater than 1000 and also noted to have high fevers. MAXIMUM TEMPERATURE of 103.Also concerns for seizures. Fever curve trending down. Patient was placed on the glucose stabilizer and has been transitioned off to Lantus. Blood sugars improved from admission and fluctuated during the hospitalization. MRI MRA head negative for any acute abnormalities. Patient has been pancultured results pending with no growth to date. Peritoneal fluid was cultured results with no growth to date. Patient improved on a daily basis. Fever curve trended down. EEG done 03/14/2017 with diffuse slowing of waking background which is nonspecific, absence of epileptiform discharges. LP was attempted by  neurology however due to patient's inability to stay still it was aborted. LP attempted under fluoroscopy which had to be aborted as well due to agitation despite patient getting Ativan and Haldol. CT abdomen and pelvis with no source of infection noted. Repeat MRI brain limited by motion however no acute abnormalities. Patient improved clinically daily. Patient now with a improving ?? global aphasia. Patient afebrile. Patient was initially on IV Fortaz, IV Diflucan, IV vancomycin. Nephrology was consulted due to ongoing back pain. Patient had presented with a worsening pain and a such patient hydrated with IV fluids. Patient was seen in consultation by ID and nephrology the hospitalization. On 03/19/2017 Foley catheter was recommended to be discontinued per ID wreaks. IV antibiotics were also all subsequently discontinued and patient to be monitored per ID. Patient with no further fevers.  #2 diabetes mellitus uncontrolled and on insulin-dependent diabetes mellitus Blood sugars on admission was 1127. ?? Etiology. Concern for infectious etiology as patient noted to have fevers up to 103. Patient has been pancultured and results pending. Peritoneal fluid has been cultured with no growth to date. Patient was placed on glucose stabilizer and has currently been transitioned to subcutaneous insulin. Patient noted to have a blood glucose of 80 -137.Continue Lantus to 8 units twice daily. Discontinued D5 normal saline. Follow CBGs and adjust insulin as needed.   #3 malignant hypertension Patient noted on admission to have systolic blood pressures in the 200s. Patient with no significant improvement with IV Lopressor and IV enalapril. Discontinued IV Lopressor and IV enalapril, Nitropaste. Significant improvement of blood pressure on Cardene drip earlier on which was discontinued. Patient was placed on oral antihypertensive medications. Systolic blood pressures in the 170s to the 200s, despite  oral regimen of Norvasc,  Lopressor, hydralazine, and addition of Imdur. Patient was started back on the Cardene drip. Patient's Norvasc was discontinued and patient placed on Procardia.   #4 global aphasia Patient with a global aphasia which is slowly improving. Patient also noted to have a right facial droop however unknown whether this is chronic. Patient with some right upper extremity weakness which is improving. Aphasia improving. Patient noted to have seizure-like activity on 03/16/2017, and currently on Keppra with no further seizures. Repeat MRI brain ordered and limited by motion however no acute abnormalities noted. Neurology following.  #5 seizure-like activity Patient was noted to have 2 episodes of seizures noted per nursing on 03/16/2017. Patient started on IV Keppra daily. Patient on Ativan when necessary. EEG negative. Repeat MRI brain limited by motion however no acute abnormalities noted. Change Keppra to 250 mg by mouth twice a day the neurology recommendations. Patient will need outpatient neurology follow-up.   #6 end-stage renal disease on peroteneal dialysis Nephrology has been consulted. Per nephrology.  #7 history of prostate cancer On Lupron injections. Outpatient follow-up with urology.  #8 anemia Follow H&H.  #9 diabetes mellitus type 2 This was initially placed on half home dose Lantus. Patient was monitored. Patient was also maintained on a sliding scale insulin. It's significant fluctuations in patient blood glucose levels and Lantus which was initially increased to 10 unitsBID, and has been decreased to 8 units daily BID. Patient blood sugars were fairly controlled and Lantus is being adjusted accordingly. Outpatient follow-up.    DVT prophylaxis: Heparin Code Status: Full Family Communication: Updated patient and family at bedside.  Disposition Plan: Transfer to telemetry. CIR versus home with home health hopefully tomorrow..   Consultants:   Neurology: Dr.Stewart  03/13/2017  Nephrology 03/14/2017  Infectious diseases: Dr. Johnnye Sima 03/15/2017  Procedures:   MRI MRA head 03/14/2017, 03/17/2017  CT head 03/13/2017  CXR 03/15/2107  CT abdomen and pelvis 03/15/2017  EEG 03/14/2017  Repeat MRI head   LP per neurology and on the fluoroscopy attempted however aborted due to increased agitation  Antimicrobials:  IV vancomycin 03/14/2017>>>>> 03/19/2017  IV Fortaz 03/14/2017>>>>> 03/19/2017  IV Diflucan 03/15/2017   Subjective: Patient alert today. Patient interacting. Patient has been questions and following commands. Patient noted to have seizure-like activity 2 on 03/16/2017 and given IV Keppra and Ativan. No further seizures. Patient taken off Cardene drip again yesterday with addition of Procardia to his regimen per nephrology. Currently off Cardene drip. Patient wants to go home. Patient noted to have low CBGs after increasing Lantus to 10 units twice a day.  Objective: Vitals:   03/20/17 1300 03/20/17 1400 03/20/17 1500 03/20/17 1600  BP: (!) 128/46 (!) 117/48 (!) 125/47 127/60  Pulse: 79 81 74 75  Resp: 19 19 (!) 21 13  Temp:    98.3 F (36.8 C)  TempSrc:    Oral  SpO2: 96% 98% 92% (!) 86%  Weight:      Height:        Intake/Output Summary (Last 24 hours) at 03/20/17 1712 Last data filed at 03/20/17 1609  Gross per 24 hour  Intake          1032.08 ml  Output                0 ml  Net          1032.08 ml   Filed Weights   03/19/17 0800 03/19/17 2032 03/20/17 0800  Weight: 76.3 kg (168 lb 3.2 oz)  76.9 kg (169 lb 8 oz) 76.6 kg (168 lb 12.8 oz)    Examination:  General exam: Alert . Respiratory system: Clear to auscultation.Marland Kitchen Respiratory effort normal. Cardiovascular system: S1 & S2 heard, RRR. No JVD, murmurs, rubs, gallops or clicks. No pedal edema. Gastrointestinal system: Abdomen is nondistended, soft and nontender. No organomegaly or masses felt. Normal bowel sounds heard. Central nervous system: Alert. Right  facial droop. Right upper extremity weakness improved. Extremities: Moving extremities spontaneously.LUE with avf with thrill, no s/sx of infection.RUE with swelling around elbow, with warmth, erythema. Skin: No rashes, lesions or ulcers Psychiatry: Unable to assess.    Data Reviewed: I have personally reviewed following labs and imaging studies  CBC:  Recent Labs Lab 03/16/17 0232 03/16/17 1624 03/17/17 0824 03/18/17 0229 03/19/17 0250 03/20/17 0341  WBC 8.8 6.7 6.4 7.1 7.4 7.5  NEUTROABS 6.7 5.1 4.5  --  5.7 6.0  HGB 9.3* 9.1* 9.3* 8.3* 8.9* 8.7*  HCT 28.1* 26.5* 27.8* 25.4* 26.8* 26.3*  MCV 80.7 80.8 82.2 81.4 81.0 80.7  PLT 199 172 165 197 201 790   Basic Metabolic Panel:  Recent Labs Lab 03/15/17 0325 03/16/17 0232 03/16/17 1624 03/17/17 0824 03/18/17 0229 03/18/17 0230 03/20/17 0341  NA 134* 133* 138 138  --  136 132*  K 5.5* 4.6 4.2 4.0  --  3.8 4.1  CL 97* 96* 100* 99*  --  98* 93*  CO2 21* 19* 22 23  --  24 26  GLUCOSE 88 322* 105* 238*  --  77 239*  BUN 77* 75* 68* 55*  --  51* 46*  CREATININE 12.48* 12.21* 12.14* 10.85*  --  10.39* 9.29*  CALCIUM 8.7* 8.3* 8.2* 8.3*  --  8.0* 8.0*  MG  --   --  1.5*  --  2.3  --   --   PHOS 9.1* 7.6*  --  7.1*  --  7.7* 8.0*   GFR: Estimated Creatinine Clearance: 8.5 mL/min (A) (by C-G formula based on SCr of 9.29 mg/dL (H)). Liver Function Tests:  Recent Labs Lab 03/15/17 0325 03/16/17 0232 03/17/17 0824 03/18/17 0230 03/20/17 0341  ALBUMIN 2.5* 2.3* 2.1* 2.0* 1.8*   No results for input(s): LIPASE, AMYLASE in the last 168 hours. No results for input(s): AMMONIA in the last 168 hours. Coagulation Profile: No results for input(s): INR, PROTIME in the last 168 hours. Cardiac Enzymes:  Recent Labs Lab 03/13/17 2349 03/14/17 0237 03/14/17 0825  TROPONINI <0.03 0.04* 0.06*   BNP (last 3 results) No results for input(s): PROBNP in the last 8760 hours. HbA1C: No results for input(s): HGBA1C in the last  72 hours. CBG:  Recent Labs Lab 03/20/17 1155 03/20/17 1410 03/20/17 1427 03/20/17 1443 03/20/17 1530  GLUCAP 133* 46* 37* 94 105*   Lipid Profile: No results for input(s): CHOL, HDL, LDLCALC, TRIG, CHOLHDL, LDLDIRECT in the last 72 hours. Thyroid Function Tests: No results for input(s): TSH, T4TOTAL, FREET4, T3FREE, THYROIDAB in the last 72 hours. Anemia Panel: No results for input(s): VITAMINB12, FOLATE, FERRITIN, TIBC, IRON, RETICCTPCT in the last 72 hours. Sepsis Labs: No results for input(s): PROCALCITON, LATICACIDVEN in the last 168 hours.  Recent Results (from the past 240 hour(s))  Gram stain     Status: None   Collection Time: 03/13/17  8:15 PM  Result Value Ref Range Status   Specimen Description PERITONEAL  Final   Special Requests NONE  Final   Gram Stain   Final    DIRECT SMEAR NO  WBC SEEN NO ORGANISMS SEEN RESULT CALLED TO, READ BACK BY AND VERIFIED WITH: M.PETERS,RN 2200 03/13/17 M.CAMPBELL    Report Status 03/13/2017 FINAL  Final  Culture, body fluid-bottle     Status: None   Collection Time: 03/13/17  8:15 PM  Result Value Ref Range Status   Specimen Description FLUID PERITONEAL  Final   Special Requests   Final    BOTTLES DRAWN AEROBIC AND ANAEROBIC Blood Culture adequate volume   Culture NO GROWTH 5 DAYS  Final   Report Status 03/18/2017 FINAL  Final  MRSA PCR Screening     Status: None   Collection Time: 03/14/17  5:18 AM  Result Value Ref Range Status   MRSA by PCR NEGATIVE NEGATIVE Final    Comment:        The GeneXpert MRSA Assay (FDA approved for NASAL specimens only), is one component of a comprehensive MRSA colonization surveillance program. It is not intended to diagnose MRSA infection nor to guide or monitor treatment for MRSA infections.   Culture, blood (Routine X 2) w Reflex to ID Panel     Status: None   Collection Time: 03/14/17  7:51 AM  Result Value Ref Range Status   Specimen Description BLOOD RIGHT HAND  Final   Special  Requests   Final    BOTTLES DRAWN AEROBIC ONLY Blood Culture adequate volume   Culture NO GROWTH 5 DAYS  Final   Report Status 03/19/2017 FINAL  Final  Culture, blood (Routine X 2) w Reflex to ID Panel     Status: None   Collection Time: 03/14/17  8:14 AM  Result Value Ref Range Status   Specimen Description BLOOD RIGHT ASSIST CONTROL  Final   Special Requests IN PEDIATRIC BOTTLE Blood Culture adequate volume  Final   Culture NO GROWTH 5 DAYS  Final   Report Status 03/19/2017 FINAL  Final  Urine culture     Status: None   Collection Time: 03/14/17  9:20 AM  Result Value Ref Range Status   Specimen Description URINE, RANDOM  Final   Special Requests NONE  Final   Culture NO GROWTH  Final   Report Status 03/15/2017 FINAL  Final  Respiratory Panel by PCR     Status: None   Collection Time: 03/15/17 10:19 AM  Result Value Ref Range Status   Adenovirus NOT DETECTED NOT DETECTED Final   Coronavirus 229E NOT DETECTED NOT DETECTED Final   Coronavirus HKU1 NOT DETECTED NOT DETECTED Final   Coronavirus NL63 NOT DETECTED NOT DETECTED Final   Coronavirus OC43 NOT DETECTED NOT DETECTED Final   Metapneumovirus NOT DETECTED NOT DETECTED Final   Rhinovirus / Enterovirus NOT DETECTED NOT DETECTED Final   Influenza A NOT DETECTED NOT DETECTED Final   Influenza B NOT DETECTED NOT DETECTED Final   Parainfluenza Virus 1 NOT DETECTED NOT DETECTED Final   Parainfluenza Virus 2 NOT DETECTED NOT DETECTED Final   Parainfluenza Virus 3 NOT DETECTED NOT DETECTED Final   Parainfluenza Virus 4 NOT DETECTED NOT DETECTED Final   Respiratory Syncytial Virus NOT DETECTED NOT DETECTED Final   Bordetella pertussis NOT DETECTED NOT DETECTED Final   Chlamydophila pneumoniae NOT DETECTED NOT DETECTED Final   Mycoplasma pneumoniae NOT DETECTED NOT DETECTED Final  Culture, blood (Routine X 2) w Reflex to ID Panel     Status: None (Preliminary result)   Collection Time: 03/19/17 11:00 AM  Result Value Ref Range Status    Specimen Description BLOOD RIGHT HAND  Final  Special Requests   Final    BOTTLES DRAWN AEROBIC ONLY Blood Culture adequate volume   Culture NO GROWTH < 24 HOURS  Final   Report Status PENDING  Incomplete         Radiology Studies: No results found.      Scheduled Meds: . darbepoetin (ARANESP) injection - NON-DIALYSIS  150 mcg Subcutaneous Q Tue-1800  . gentamicin cream  1 application Topical Daily  . heparin subcutaneous  5,000 Units Subcutaneous Q8H  . hydrALAZINE  100 mg Oral TID  . insulin aspart  0-9 Units Subcutaneous TID WC  . insulin glargine  8 Units Subcutaneous BID  . isosorbide mononitrate  15 mg Oral Daily  . metoprolol tartrate  100 mg Oral BID  . NIFEdipine  90 mg Oral Daily  . pantoprazole  40 mg Oral Q0600  . sodium chloride flush  3 mL Intravenous Q12H   Continuous Infusions: . dialysis solution 1.5% low-MG/low-CA    . levETIRAcetam Stopped (03/20/17 1624)  . niCARDipine Stopped (03/20/17 0800)     LOS: 7 days    Time spent: 47 mins    Jared Waide, MD [facility] Pager [phone]  If 7PM-7AM, please contact night-coverage [email or web address] Password [facility] 03/20/2017, 5:12 PM

## 2017-03-20 NOTE — Progress Notes (Addendum)
Subjective: Continues to improve  Exam: Vitals:   03/20/17 0530 03/20/17 0800  BP: (!) 146/67 (!) 144/59  Pulse: 85 85  Resp: 17 17  Temp:  98 F (36.7 C)   Gen: In bed, NAD Resp: non-labored breathing, no acute distress Abd: soft, nt  Neuro: MS: awake, alert, still with mild aphasia, but improving.  XA:JOINO, EOMI, VFF Motor: MAEW  Sensory:reports slight numbness and/or pain in the right  Arm, but improving  Impression: 61 yo M with gradually improving aphasia following seizure secondary to severe hyperglycemia. I would favor continuing keppra in the short term(e.g. 1 - 2 months) as he recovers, but does not necessarily need it long term. I wonder if he had prolonged seizure leading to mild injury, if this is the case would expect recovery similar to if the deficits were due to stroke.   With his similar presentation previously(during which time LP was negative) and with his current improvement, I think CNS infection unlikely and do not feel that LP is indicated at the current time.   Recommendations: 1) continue keppra 250mg  BID 2) continue SLP 3) No further recommendations at this time. Would have patient follow up with neurology in 2 - 4 weeks after discharge. I have requested this.   [name], MD [facility] [phone]  If 7pm- 7am, please page neurology on call as listed in [place].

## 2017-03-20 NOTE — Consult Note (Signed)
Physical Medicine and Rehabilitation Consult Reason for Consult: Altered mental status with decreased functional mobility Referring Physician: [facility]   HPI: Jared [name] is a 61 y.o. right hand male with history of end-stage renal disease with peritoneal dialysis, hypertension, diabetes mellitus, prostate cancer and prior seizure disorder. Per chart review patient lives with sister and brother-in-law. Independent prior to admission he does his own peritoneal dialysis at home and drives. Family plans assistance as needed but limited physically. Presented 03/13/2017 with altered mental status and slurred speech. Denied any recent trauma. Lactic acid 1.35, sodium 122, potassium 5.6, creatinine 9.88. MRI of the brain negative. MRA unremarkable. CT of the abdomen showed moderate volume of ascites stable as compared to January 2018. EEG negative for seizure. Low-grade fever infectious disease consult did blood cultures negative etiology of syndrome of unclear in regards to fever. Neurology follow-up suspect acute encephalopathy. Follow-up renal services in regards to dialysis. Occupational therapy 03/20/2017 no tissue in regards to generalized weakness decreased safety risk of falls. Recommendations made for physical medicine rehabilitation consult.   Review of Systems  Constitutional: Positive for chills and fever.  HENT: Negative for hearing loss.   Eyes: Negative for blurred vision and double vision.  Respiratory: Negative for cough and shortness of breath.   Cardiovascular: Positive for leg swelling. Negative for chest pain and palpitations.  Gastrointestinal: Positive for constipation. Negative for nausea and vomiting.  Genitourinary: Negative for hematuria.  Musculoskeletal: Positive for joint pain and myalgias.  Skin: Negative for rash.  Neurological: Positive for seizures and weakness.  Psychiatric/Behavioral: The patient has insomnia.   All other systems reviewed and are  negative.  Past Medical History:  Diagnosis Date  . Anemia   . Chronic kidney disease    on dialysis M-W-F  . Diabetes mellitus without complication ([place])    onset as adult  . Hypertension   . Seizures ([place])   . Stroke ([place])   . Thyroid disease    hyperparathyroidism   Past Surgical History:  Procedure Laterality Date  . AV FISTULA PLACEMENT Left 06/04/2014   Procedure: ARTERIOVENOUS (AV) FISTULA CREATION-  BRACHIOCEPHALIC WITH LIGATION OF COMPETEING BRANCH;  Surgeon: Mal Misty, MD;  Location: [facility];  Service: Vascular;  Laterality: Left;  . INSERTION OF DIALYSIS CATHETER  04/2014  . IR GENERIC HISTORICAL Left 11/30/2016   IR THROMBECTOMY AV FISTULA W/THROMBOLYSIS/PTA/STENT INC/SHUNT/IMG LT 11/30/2016 Markus Daft, MD [facility]  . IR GENERIC HISTORICAL  11/30/2016   IR US GUIDE VASC ACCESS LEFT 11/30/2016 Markus Daft, MD [facility]   Family History  Problem Relation Age of Onset  . Cancer - Lung Mother   . Cancer - Other Father    Social History:  reports that he quit smoking about 32 years ago. He has never used smokeless tobacco. He reports that he drinks about 0.6 oz of alcohol per week . He reports that he does not use drugs. Allergies:  Allergies  Allergen Reactions  . Penicillins Other (See Comments)    Has patient had a PCN reaction causing immediate rash, facial/tongue/throat swelling, SOB or lightheadedness with hypotension: Unk Has patient had a PCN reaction causing severe rash involving mucus membranes or skin necrosis: Unk Has patient had a PCN reaction that required hospitalization: Unk Has patient had a PCN reaction occurring within the last 10 years: Unk If all of the above answers are "NO", then may proceed with Cephalosporin use.    Medications Prior to Admission  Medication Sig Dispense Refill  .  amLODipine (NORVASC) 10 MG tablet Take 1 tablet (10 mg total) by mouth daily. 30 tablet 0  . atorvastatin (LIPITOR) 20 MG tablet Take 1 tablet (20 mg total) by  mouth daily. 90 tablet 3  . calcitRIOL (ROCALTROL) 0.5 MCG capsule Take 1 capsule (0.5 mcg total) by mouth daily. 30 capsule 0  . calcium carbonate (TUMS - DOSED IN MG ELEMENTAL CALCIUM) 500 MG chewable tablet Chew 2 tablets (400 mg of elemental calcium total) by mouth 3 (three) times daily with meals. 30 tablet 0  . gentamicin cream (GARAMYCIN) 0.1 % Apply 1 application topically daily. To PD site 15 g 0  . hydrALAZINE (APRESOLINE) 100 MG tablet Take 1 tablet (100 mg total) by mouth 3 (three) times daily. 90 tablet 0  . insulin NPH Human (HUMULIN N,NOVOLIN N) 100 UNIT/ML injection Inject 0.15 mLs (15 Units total) into the skin 2 (two) times daily at 8 am and 10 pm. 10 mL 0  . metoprolol (LOPRESSOR) 100 MG tablet Take 1 tablet (100 mg total) by mouth 2 (two) times daily. 60 tablet 0  . multivitamin (RENA-VIT) TABS tablet Take 1 tablet by mouth daily.    Marland Kitchen senna-docusate (SENOKOT-S) 8.6-50 MG tablet Take 2 tablets by mouth at bedtime. 60 tablet 0  . sevelamer carbonate (RENVELA) 800 MG tablet Take 800 mg by mouth 3 (three) times daily with meals.    . temazepam (RESTORIL) 15 MG capsule Take 15 mg by mouth at bedtime.      Home: Home Living Family/patient expects to be discharged to:: Private residence Living Arrangements: Other relatives (sisters ) Available Help at Discharge: Family, Available 24 hours/day Type of Home: House Home Access: Level entry Home Layout: Two level, Bed/bath upstairs Alternate Level Stairs-Number of Steps: 1 flight Alternate Level Stairs-Rails: Left, Right Bathroom Shower/Tub: Multimedia programmer: Standard Bathroom Accessibility: Yes Home Equipment: None  Lives With: Family  Functional History: Prior Function Level of Independence: Independent Comments: Works periodically transporting cars; 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  Functional Status:  Mobility: Bed Mobility Overal bed mobility: Needs Assistance Bed Mobility: Supine to Sit, Sit to  Supine Supine to sit: Min guard Sit to supine: Min guard General bed mobility comments: No physical assistance but min guard for safety. Transfers Overall transfer level: Needs assistance Equipment used: None Transfers: Sit to/from Stand Sit to Stand: Min assist General transfer comment: Min assist for trunk support. Ambulation/Gait Ambulation/Gait assistance: Min assist, +2 safety/equipment Ambulation Distance (Feet): 15 Feet (15) Assistive device: None, Rolling walker (2 wheeled) Gait Pattern/deviations: Step-through pattern, Decreased weight shift to right, Staggering right, Decreased stride length General Gait Details: Amb to bathroom with no AD and minA for balance; increased lateral sway with RLE instability, requiring minA for balance; scissoring with RLE, with potential proprioceptive issues (??). Amb from bathroom to bed with RW and min guard for balance with RLE instability; required verbal cues for hand placement with RW.     ADL: ADL Overall ADL's : Needs assistance/impaired Eating/Feeding: NPO Grooming: Minimal assistance, Sitting Upper Body Bathing: Total assistance, Bed level Lower Body Bathing: Total assistance, Bed level Upper Body Dressing : Total assistance, Bed level Lower Body Dressing: Total assistance, Bed level Toilet Transfer: Minimal assistance, Moderate assistance, RW, Ambulation Toilet Transfer Details (indicate cue type and reason): Pt unsteady with R knee buckling. Required cues to utilize RW safely. Toileting- Clothing Manipulation and Hygiene: Total assistance, Bed level Functional mobility during ADLs: Minimal assistance, Moderate assistance, Rolling walker General ADL Comments: Pt unsteady this  session with mobility and impulsive. Pt's sister and son present during session and educated them concerning need for 24/7 hands on assistance to ensure safety post-acute D/C. Family in agreement that comprehensive rehabilitation in an inpatient setting is most  beneficial for pt. However, pt is adamant that he return home.  Cognition: Cognition Overall Cognitive Status: Impaired/Different from baseline Arousal/Alertness: Lethargic Orientation Level: Oriented to person, Oriented to place, Oriented to time Attention: Focused Focused Attention: Impaired Focused Attention Impairment: Verbal basic, Functional basic Awareness: Impaired Awareness Impairment: Emergent impairment Problem Solving: Impaired Problem Solving Impairment: Functional basic Behaviors: Restless Safety/Judgment: Impaired Cognition Arousal/Alertness: Awake/alert Behavior During Therapy: WFL for tasks assessed/performed Overall Cognitive Status: Impaired/Different from baseline Area of Impairment: Attention, Memory, Following commands, Safety/judgement, Awareness, Problem solving Orientation Level: Disoriented to, Time Current Attention Level: Selective Memory: Decreased short-term memory Following Commands: Follows one step commands consistently Safety/Judgement: Decreased awareness of safety, Decreased awareness of deficits Awareness: Intellectual Problem Solving: Slow processing, Decreased initiation, Requires verbal cues, Requires tactile cues General Comments: Pt with improved interaction today but highly impulsive and attempting to sit unsafely. Poor problem solving skills despite multimodal cues. Pt becoming agitated with therapist when given multi-step commands.  Blood pressure (!) 151/71, pulse 84, temperature 98.6 F (37 C), temperature source Oral, resp. rate 18, height 5\' 10"  (1.778 m), weight 76.6 kg (168 lb 12.8 oz), SpO2 98 %. Physical Exam  HENT:  Head: Normocephalic.  Eyes: EOM are normal.  Neck: Normal range of motion. Neck supple. No thyromegaly present.  Cardiovascular: Normal rate and regular rhythm.   Respiratory: Effort normal and breath sounds normal. No respiratory distress.  GI: Soft. Bowel sounds are normal. He exhibits no distension.   Neurological:  Patient is lethargic but arousable. He would fall back asleep during exam. He was able to provide his name and date of birth. Followed simple commands. His sister was at bedside.  Skin: Skin is warm and dry.    Results for orders placed or performed during the hospital encounter of 03/13/17 (from the past 24 hour(s))  Glucose, capillary     Status: Abnormal   Collection Time: 03/19/17  3:40 PM  Result Value Ref Range   Glucose-Capillary 101 (H) 65 - 99 mg/dL  Glucose, capillary     Status: Abnormal   Collection Time: 03/19/17  9:06 PM  Result Value Ref Range   Glucose-Capillary 134 (H) 65 - 99 mg/dL  Glucose, capillary     Status: Abnormal   Collection Time: 03/19/17 11:23 PM  Result Value Ref Range   Glucose-Capillary 189 (H) 65 - 99 mg/dL  Glucose, capillary     Status: Abnormal   Collection Time: 03/20/17  3:23 AM  Result Value Ref Range   Glucose-Capillary 237 (H) 65 - 99 mg/dL  CBC with Differential/Platelet     Status: Abnormal   Collection Time: 03/20/17  3:41 AM  Result Value Ref Range   WBC 7.5 4.0 - 10.5 K/uL   RBC 3.26 (L) 4.22 - 5.81 MIL/uL   Hemoglobin 8.7 (L) 13.0 - 17.0 g/dL   HCT 26.3 (L) 39.0 - 52.0 %   MCV 80.7 78.0 - 100.0 fL   MCH 26.7 26.0 - 34.0 pg   MCHC 33.1 30.0 - 36.0 g/dL   RDW 15.3 11.5 - 15.5 %   Platelets 252 150 - 400 K/uL   Neutrophils Relative % 79 %   Neutro Abs 6.0 1.7 - 7.7 K/uL   Lymphocytes Relative 12 %   Lymphs Abs 0.9  0.7 - 4.0 K/uL   Monocytes Relative 7 %   Monocytes Absolute 0.6 0.1 - 1.0 K/uL   Eosinophils Relative 2 %   Eosinophils Absolute 0.1 0.0 - 0.7 K/uL   Basophils Relative 0 %   Basophils Absolute 0.0 0.0 - 0.1 K/uL  Renal function panel     Status: Abnormal   Collection Time: 03/20/17  3:41 AM  Result Value Ref Range   Sodium 132 (L) 135 - 145 mmol/L   Potassium 4.1 3.5 - 5.1 mmol/L   Chloride 93 (L) 101 - 111 mmol/L   CO2 26 22 - 32 mmol/L   Glucose, Bld 239 (H) 65 - 99 mg/dL   BUN 46 (H) 6 -  20 mg/dL   Creatinine, Ser 9.29 (H) 0.61 - 1.24 mg/dL   Calcium 8.0 (L) 8.9 - 10.3 mg/dL   Phosphorus 8.0 (H) 2.5 - 4.6 mg/dL   Albumin 1.8 (L) 3.5 - 5.0 g/dL   GFR calc non Af Amer 5 (L) >60 mL/min   GFR calc Af Amer 6 (L) >60 mL/min   Anion gap 13 5 - 15  Glucose, capillary     Status: Abnormal   Collection Time: 03/20/17  7:54 AM  Result Value Ref Range   Glucose-Capillary 281 (H) 65 - 99 mg/dL  Glucose, capillary     Status: Abnormal   Collection Time: 03/20/17 11:55 AM  Result Value Ref Range   Glucose-Capillary 133 (H) 65 - 99 mg/dL  Glucose, capillary     Status: Abnormal   Collection Time: 03/20/17  2:10 PM  Result Value Ref Range   Glucose-Capillary 46 (L) 65 - 99 mg/dL  Glucose, capillary     Status: Abnormal   Collection Time: 03/20/17  2:27 PM  Result Value Ref Range   Glucose-Capillary 37 (LL) 65 - 99 mg/dL   Comment 1 Notify RN    Comment 2 Document in Chart   Glucose, capillary     Status: None   Collection Time: 03/20/17  2:43 PM  Result Value Ref Range   Glucose-Capillary 94 65 - 99 mg/dL   Comment 1 Notify RN    Comment 2 Document in Chart    No results found.  Assessment/Plan: Diagnosis: seizure due to hyperglycemia, encephalopathy due to infectious etiology? 1. Does the need for close, 24 hr/day medical supervision in concert with the patient's rehab needs make it unreasonable for this patient to be served in a less intensive setting? Yes 2. Co-Morbidities requiring supervision/potential complications: ESRD on PD, prostate cancer 3. Due to bladder management, bowel management, safety, skin/wound care, disease management, medication administration, pain management and patient education, does the patient require 24 hr/day rehab nursing? Yes 4. Does the patient require coordinated care of a physician, rehab nurse, PT (1-2 hrs/day, 5 days/week), OT (1-2 hrs/day, 5 days/week) and SLP (1-2 hrs/day, 5 days/week) to address physical and functional deficits in the  context of the above medical diagnosis(es)? Yes Addressing deficits in the following areas: balance, endurance, locomotion, strength, transferring, bowel/bladder control, bathing, dressing, feeding, grooming, toileting, cognition, speech, swallowing and psychosocial support 5. Can the patient actively participate in an intensive therapy program of at least 3 hrs of therapy per day at least 5 days per week? Yes 6. The potential for patient to make measurable gains while on inpatient rehab is excellent 7. Anticipated functional outcomes upon discharge from inpatient rehab are modified independent and supervision  with PT, modified independent and supervision with OT, modified independent and supervision with  SLP. 8. Estimated rehab length of stay to reach the above functional goals is: 8-12 days 9. Does the patient have adequate social supports and living environment to accommodate these discharge functional goals? Yes 10. Anticipated D/C setting: Home 11. Anticipated post D/C treatments: HH therapy and Outpatient therapy 12. Overall Rehab/Functional Prognosis: excellent  RECOMMENDATIONS: This patient's condition is appropriate for continued rehabilitative care in the following setting: CIR Patient has agreed to participate in recommended program. Yes Note that insurance prior authorization may be required for reimbursement for recommended care.  Comment: Rehab Admissions Coordinator to follow up.  Thanks,  [name], MD, Mellody Drown    Cathlyn Parsons., PA-C 03/20/2017

## 2017-03-20 NOTE — Progress Notes (Addendum)
Met with pt and his family (son, sister and girlfriend) to discuss dc plans.  Pt has orders for [facility], and MD states pt is medically stable for dc home today.  PT/OT recommending CIR.  After much discussion, pt/family would like rehab MD to evaluate for CIR admission.    Of note, while we were discussing dc planning, pt c/o not feeling well, and that his blood sugar may be low.  CBG checked by RN; result 46.  Nurse giving pt orange juice.  Notified Dr. Grandville Silos of low blood sugar and family's request for rehab consult.  MD to consult rehab MD for evaluation.  No other orders received.    Reinaldo Raddle, RN, BSN  Trauma/Neuro ICU Case Manager [phone]

## 2017-03-20 NOTE — Progress Notes (Signed)
INFECTIOUS DISEASE PROGRESS NOTE  ID: Jared [name] is a 61 y.o. male with  Principal Problem:   Acute encephalopathy Active Problems:   Seizure ([place])   CKD (chronic kidney disease) stage V requiring chronic dialysis ([place])   Diabetes mellitus due to underlying condition, uncontrolled, without complication, with long-term current use of insulin ([facility])   Prostate cancer ([facility])   Hyperglycemia   Accelerated hypertension   Fever   Weakness   Witnessed seizure-like activity ([facility])   Global aphasia   Slurred speech  Subjective: No complaints.   Abtx:  Anti-infectives    Start     Dose/Rate Route Frequency Ordered Stop   03/17/17 1200  vancomycin (VANCOCIN) IVPB 750 mg/150 ml premix     750 mg 150 mL/hr over 60 Minutes Intravenous  Once 03/17/17 0742 03/17/17 1343   03/16/17 1000  fluconazole (DIFLUCAN) IVPB 100 mg  Status:  Discontinued     100 mg 50 mL/hr over 60 Minutes Intravenous Every 24 hours 03/15/17 1143 03/19/17 1047   03/15/17 1800  cefTAZidime (FORTAZ) 500 mg in dextrose 5 % 50 mL IVPB  Status:  Discontinued     500 mg 100 mL/hr over 30 Minutes Intravenous Every 24 hours 03/14/17 1344 03/19/17 1047   03/15/17 1230  fluconazole (DIFLUCAN) IVPB 200 mg     200 mg 100 mL/hr over 60 Minutes Intravenous  Once 03/15/17 1143 03/15/17 1333   03/14/17 1330  cefTRIAXone (ROCEPHIN) 2 g in dextrose 5 % 50 mL IVPB  Status:  Discontinued     2 g 100 mL/hr over 30 Minutes Intravenous Every 12 hours 03/14/17 1325 03/14/17 1436   03/14/17 1300  vancomycin (VANCOCIN) 2,000 mg in sodium chloride 0.9 % 500 mL IVPB     2,000 mg 250 mL/hr over 120 Minutes Intravenous  Once 03/14/17 1231 03/14/17 1531   03/14/17 1300  cefTAZidime (FORTAZ) 1 g in dextrose 5 % 50 mL IVPB     1 g 100 mL/hr over 30 Minutes Intravenous  Once 03/14/17 1231 03/14/17 1408      Medications:  Scheduled: . darbepoetin (ARANESP) injection - NON-DIALYSIS  150 mcg Subcutaneous Q Tue-1800  . gentamicin cream  1  application Topical Daily  . heparin subcutaneous  5,000 Units Subcutaneous Q8H  . hydrALAZINE  100 mg Oral TID  . insulin aspart  0-9 Units Subcutaneous TID WC  . insulin glargine  10 Units Subcutaneous BID  . isosorbide mononitrate  15 mg Oral Daily  . metoprolol tartrate  100 mg Oral BID  . NIFEdipine  90 mg Oral Daily  . pantoprazole  40 mg Oral Q0600  . sodium chloride flush  3 mL Intravenous Q12H    Objective: Vital signs in last 24 hours: Temp:  [97.9 F (36.6 C)-98.8 F (37.1 C)] 98 F (36.7 C) (04/24 0800) Pulse Rate:  [74-95] 84 (04/24 1000) Resp:  [2-23] 18 (04/24 1000) BP: (123-178)/(58-138) 151/71 (04/24 1000) SpO2:  [90 %-100 %] 98 % (04/24 1000) Weight:  [76.6 kg (168 lb 12.8 oz)-76.9 kg (169 lb 8 oz)] 76.6 kg (168 lb 12.8 oz) (04/24 0800)   General appearance: alert, cooperative and no distress Resp: clear to auscultation bilaterally Cardio: regular rate and rhythm GI: normal findings: bowel sounds normal and soft, non-tender  Lab Results  Recent Labs  03/18/17 0230 03/19/17 0250 03/20/17 0341  WBC  --  7.4 7.5  HGB  --  8.9* 8.7*  HCT  --  26.8* 26.3*  NA 136  --  132*  K 3.8  --  4.1  CL 98*  --  93*  CO2 24  --  26  BUN 51*  --  46*  CREATININE 10.39*  --  9.29*   Liver Panel  Recent Labs  03/18/17 0230 03/20/17 0341  ALBUMIN 2.0* 1.8*   Sedimentation Rate No results for input(s): ESRSEDRATE in the last 72 hours. C-Reactive Protein No results for input(s): CRP in the last 72 hours.  Microbiology: Recent Results (from the past 240 hour(s))  Gram stain     Status: None   Collection Time: 03/13/17  8:15 PM  Result Value Ref Range Status   Specimen Description PERITONEAL  Final   Special Requests NONE  Final   Gram Stain   Final    DIRECT SMEAR NO WBC SEEN NO ORGANISMS SEEN RESULT CALLED TO, READ BACK BY AND VERIFIED WITH: M.PETERS,RN 2200 03/13/17 M.CAMPBELL    Report Status 03/13/2017 FINAL  Final  Culture, body fluid-bottle      Status: None   Collection Time: 03/13/17  8:15 PM  Result Value Ref Range Status   Specimen Description FLUID PERITONEAL  Final   Special Requests   Final    BOTTLES DRAWN AEROBIC AND ANAEROBIC Blood Culture adequate volume   Culture NO GROWTH 5 DAYS  Final   Report Status 03/18/2017 FINAL  Final  MRSA PCR Screening     Status: None   Collection Time: 03/14/17  5:18 AM  Result Value Ref Range Status   MRSA by PCR NEGATIVE NEGATIVE Final    Comment:        The GeneXpert MRSA Assay (FDA approved for NASAL specimens only), is one component of a comprehensive MRSA colonization surveillance program. It is not intended to diagnose MRSA infection nor to guide or monitor treatment for MRSA infections.   Culture, blood (Routine X 2) w Reflex to ID Panel     Status: None   Collection Time: 03/14/17  7:51 AM  Result Value Ref Range Status   Specimen Description BLOOD RIGHT HAND  Final   Special Requests   Final    BOTTLES DRAWN AEROBIC ONLY Blood Culture adequate volume   Culture NO GROWTH 5 DAYS  Final   Report Status 03/19/2017 FINAL  Final  Culture, blood (Routine X 2) w Reflex to ID Panel     Status: None   Collection Time: 03/14/17  8:14 AM  Result Value Ref Range Status   Specimen Description BLOOD RIGHT ASSIST CONTROL  Final   Special Requests IN PEDIATRIC BOTTLE Blood Culture adequate volume  Final   Culture NO GROWTH 5 DAYS  Final   Report Status 03/19/2017 FINAL  Final  Urine culture     Status: None   Collection Time: 03/14/17  9:20 AM  Result Value Ref Range Status   Specimen Description URINE, RANDOM  Final   Special Requests NONE  Final   Culture NO GROWTH  Final   Report Status 03/15/2017 FINAL  Final  Respiratory Panel by PCR     Status: None   Collection Time: 03/15/17 10:19 AM  Result Value Ref Range Status   Adenovirus NOT DETECTED NOT DETECTED Final   Coronavirus 229E NOT DETECTED NOT DETECTED Final   Coronavirus HKU1 NOT DETECTED NOT DETECTED Final    Coronavirus NL63 NOT DETECTED NOT DETECTED Final   Coronavirus OC43 NOT DETECTED NOT DETECTED Final   Metapneumovirus NOT DETECTED NOT DETECTED Final   Rhinovirus / Enterovirus NOT DETECTED NOT DETECTED Final  Influenza A NOT DETECTED NOT DETECTED Final   Influenza B NOT DETECTED NOT DETECTED Final   Parainfluenza Virus 1 NOT DETECTED NOT DETECTED Final   Parainfluenza Virus 2 NOT DETECTED NOT DETECTED Final   Parainfluenza Virus 3 NOT DETECTED NOT DETECTED Final   Parainfluenza Virus 4 NOT DETECTED NOT DETECTED Final   Respiratory Syncytial Virus NOT DETECTED NOT DETECTED Final   Bordetella pertussis NOT DETECTED NOT DETECTED Final   Chlamydophila pneumoniae NOT DETECTED NOT DETECTED Final   Mycoplasma pneumoniae NOT DETECTED NOT DETECTED Final  Culture, blood (Routine X 2) w Reflex to ID Panel     Status: None (Preliminary result)   Collection Time: 03/19/17 11:00 AM  Result Value Ref Range Status   Specimen Description BLOOD RIGHT HAND  Final   Special Requests   Final    BOTTLES DRAWN AEROBIC ONLY Blood Culture adequate volume   Culture NO GROWTH < 24 HOURS  Final   Report Status PENDING  Incomplete    Studies/Results: No results found.   Assessment/Plan: Fever  Peritoneal dialysis  Protein Calorie Malnutrition  Total days of antibiotics: off 24h                                                                                      cx have been negative No temps Ready for d/c Etiology of his syndrome unclear.          Bobby Rumpf Infectious Diseases (pager) [phone] [email or web address] 03/20/2017, 11:31 AM  LOS: 7 days

## 2017-03-20 NOTE — Progress Notes (Signed)
I met with pt at bedside with his girlfriend, a son and a sister. We discussed a possible inpt rehab admission prior to d/c home with family. He is in agreement to admit. I contacted Dr. Grandville Silos and he is in agreement to admit tomorrow. I will make the arrangements. RN CM and SW made aware. [phone]

## 2017-03-20 NOTE — Progress Notes (Signed)
Occupational Therapy Treatment Patient Details Name: Jared [name] MRN: [id number] DOB: 03-Oct-1956 Today's Date: 03/20/2017    History of present illness Pt is a 61 y.o. male admitted to ED on 03/13/17 with AMS, hyperglycemia (>1000), and fevers. CT and MRI negative for acute infarct; additional workup is pending. EEG 4/18 abnormal with diffuse slowing of waking background. Most recent seizure-like activity x2 on 4/20. Pertinent PMH includes ESRD (on home dialysis), DM, seizures, HTN, chronic cough, prostate CA.    OT comments  Pt progressing toward OT goals. This session he was able to complete toilet transfers with RW with min-mod assistance. Pt continues to demonstrate difficulty following one-step commands and decreased problem solving and safety awareness related to ADL participation. Pt additionally becoming agitated with therapist at times when given instruction. He is highly unsafe due to decreased balance, generalized weakness, and impulsivity putting him at a great risk for falling. Discussed D/C planning with family (sister and son). Pt's sister reports that the family will work out 24 hour assistance if he is to return home but they would prefer rehabilitation prior to home. Pt, however, is adamant that he return home and if so will need maximum home health services including OT and aide. Continue to feel pt would benefit from CIR level therapies in order to maximize safety and independence.   Follow Up Recommendations  Supervision/Assistance - 24 hour;CIR    Equipment Recommendations  None recommended by OT    Recommendations for Other Services Rehab consult    Precautions / Restrictions Precautions Precautions: Fall Restrictions Weight Bearing Restrictions: No       Mobility Bed Mobility Overal bed mobility: Needs Assistance Bed Mobility: Supine to Sit;Sit to Supine     Supine to sit: Min guard Sit to supine: Min guard   General bed mobility comments: No physical  assistance but min guard for safety.  Transfers Overall transfer level: Needs assistance Equipment used: None Transfers: Sit to/from Stand Sit to Stand: Min assist         General transfer comment: Min assist for trunk support.    Balance Overall balance assessment: Needs assistance Sitting-balance support: No upper extremity supported;Feet supported Sitting balance-Leahy Scale: Fair Sitting balance - Comments: Able to maintain sitting balance with min guard.      Standing balance-Leahy Scale: Fair Standing balance comment: Able to stand with min guard assist without UE support for static tasks.                           ADL either performed or assessed with clinical judgement   ADL Overall ADL's : Needs assistance/impaired     Grooming: Minimal assistance;Sitting                   Toilet Transfer: Minimal assistance;Moderate assistance;RW;Ambulation Toilet Transfer Details (indicate cue type and reason): Pt unsteady with R knee buckling. Required cues to utilize RW safely.         Functional mobility during ADLs: Minimal assistance;Moderate assistance;Rolling walker General ADL Comments: Pt unsteady this session with mobility and impulsive. Pt's sister and son present during session and educated them concerning need for 24/7 hands on assistance to ensure safety post-acute D/C. Family in agreement that comprehensive rehabilitation in an inpatient setting is most beneficial for pt. However, pt is adamant that he return home.     Vision   Additional Comments: No gaze preference noted this session.   Perception     Praxis  Cognition Arousal/Alertness: Awake/alert Behavior During Therapy: WFL for tasks assessed/performed Overall Cognitive Status: Impaired/Different from baseline Area of Impairment: Attention;Memory;Following commands;Safety/judgement;Awareness;Problem solving                   Current Attention Level:  Selective Memory: Decreased short-term memory Following Commands: Follows one step commands consistently Safety/Judgement: Decreased awareness of safety;Decreased awareness of deficits Awareness: Intellectual Problem Solving: Slow processing;Decreased initiation;Requires verbal cues;Requires tactile cues General Comments: Pt with improved interaction today but highly impulsive and attempting to sit unsafely. Poor problem solving skills despite multimodal cues. Pt becoming agitated with therapist when given multi-step commands.        Exercises     Shoulder Instructions       General Comments VSS    Pertinent Vitals/ Pain       Pain Assessment: Faces Faces Pain Scale: Hurts little more Pain Location: pain in arm that pt reported as "acid reflux" Pain Descriptors / Indicators: Discomfort;Grimacing Pain Intervention(s): Limited activity within patient's tolerance;Monitored during session;Repositioned  Home Living                                          Prior Functioning/Environment              Frequency  Min 2X/week        Progress Toward Goals  OT Goals(current goals can now be found in the care plan section)  Progress towards OT goals: Progressing toward goals  Acute Rehab OT Goals Patient Stated Goal: Return home OT Goal Formulation: Patient unable to participate in goal setting Time For Goal Achievement: 03/29/17 Potential to Achieve Goals: Fair ADL Goals Pt Will Perform Grooming: with mod assist;sitting Additional ADL Goal #1: Pt will sit EOB x 5 mins with mod A in prep for ADLs Additional ADL Goal #2: Pt will sustain attention to familiar task x 3 mins with min cues  Additional ADL Goal #3: Pt will follow one step commands during ADLs 50% of time   Plan Discharge plan remains appropriate    Co-evaluation                 End of Session Equipment Utilized During Treatment: Gait belt;Rolling walker  OT Visit Diagnosis:  Cognitive communication deficit (R41.841)   Activity Tolerance Patient limited by lethargy   Patient Left in bed;with call bell/phone within reach;with bed alarm set;with family/visitor present   Nurse Communication Mobility status (pt pulling on IV area - may need to check)        Time: 0211-1552 OT Time Calculation (min): 35 min  Charges: OT General Charges $OT Visit: 1 Procedure OT Treatments $Self Care/Home Management : 23-37 mins  Norman Herrlich, MS OTR/L  Pager: Jared [name] 03/20/2017, 1:09 PM

## 2017-03-20 NOTE — PMR Pre-admission (Signed)
PMR Admission Coordinator Pre-Admission Assessment  Patient: Jared [name] is an 61 y.o., male MRN: [id number] DOB: Apr 07, 1956 Height: 5\' 10"  (177.8 cm) Weight: 76.2 kg (167 lb 15.9 oz)              Insurance Information HMO:     PPO:      PCP:      IPA:      80/20: yes     OTHER:  No HMO PRIMARY: Medicare a and b      Policy#: [id number]      Subscriber: pt Benefits:  Phone #: passport one online     Name: 03/20/2017 Eff. Date: 06/27/2014     Deduct: $1340      Out of Pocket Max: none      Life Max: none CIR: 100%      SNF: 20 full days Outpatient: 80%     Co-Pay: 20% Home Health: 100%      Co-Pay: none DME: 80%     Co-Pay: 20% Providers: pt choice  SECONDARY: none      Policy#:       Subscriber:   Medicaid Application Date:       Case Manager:  Disability Application Date:       Case Worker:   Emergency Contact Information Contact Information    Name Relation Home Work Mobile   [place] Sister [phone]  [phone]   Kong, Packett The Third Son   [phone]   Clifton,Linda Sister   [phone]   Kosch,Burnadette Sister   [phone]   Nelida Gores   [phone]     Current Medical History  Patient Admitting Diagnosis: seizure due to hyperglycemia, encephalopathy due to questionable infectious etiology  History of Present Illness:  HPI: Jared [name] is a 61 y.o. right hand male with history of end-stage renal disease with peritoneal dialysis, hypertension, diabetes mellitus, prostate cancer and prior seizure disorder.  Presented 03/13/2017 with altered mental status and slurred speech. Denied any recent trauma. Lactic acid 1.35, sodium 122, potassium 5.6, creatinine 9.88. MRI of the brain negative. MRA unremarkable. CT of the abdomen showed moderate volume of ascites stable as compared to January 2018. EEG negative for seizure. Low-grade fever infectious disease consult did blood cultures negative etiology of syndrome of unclear in regards to fever.  Neurology follow-up suspect acute encephalopathy. Follow-up renal services in regards to dialysis. Occupational therapy 03/20/2017 no issue in regards to generalized weakness decreased safety risk of falls.   Past Medical History  Past Medical History:  Diagnosis Date  . Anemia   . Chronic kidney disease    on dialysis M-W-F  . Diabetes mellitus without complication ([place])    onset as adult  . Hypertension   . Seizures ([place])   . Stroke ([place])   . Thyroid disease    hyperparathyroidism    Family History  family history includes Cancer - Lung in his mother; Cancer - Other in his father.  Prior Rehab/Hospitalizations:  Has the patient had major surgery during 100 days prior to admission? No  CIR 11/2016 went home at supervision to Mod I level. Did drive at discharge. [facility] was arranged at d/c, but pt dismissed them for felt not needed. Did not follow up with Dr. Naaman Plummer as an outpatient. Pt did follow up with Neurology at discharge and new PCP.  Current Medications   Current Facility-Administered Medications:  .  acetaminophen (TYLENOL) tablet 650 mg, 650 mg, Oral, Q6H PRN **OR** acetaminophen (TYLENOL) suppository 650 mg, 650  mg, Rectal, Q6H PRN, Lily Kocher, MD, 650 mg at 03/14/17 2356 .  Darbepoetin Alfa (ARANESP) injection 150 mcg, 150 mcg, Subcutaneous, Q Tue-1800, Ihor Austin, RPH .  dextrose 50 % solution 25 mL, 25 mL, Intravenous, PRN, Veryl Speak, MD, 25 mL at 03/20/17 1433 .  dialysis solution 1.5% low-MG/low-CA dianeal solution, , Intraperitoneal, Q24H, Eugenie Filler, MD, 15,000 mL at 03/20/17 2254 .  gentamicin cream (GARAMYCIN) 0.1 % 1 application, 1 application, Topical, Daily, Roney Jaffe, MD, 1 application at 46/56/81 [phone] .  gi cocktail (Maalox,Lidocaine,Donnatal), 30 mL, Oral, TID PRN, Irine Seal V, MD, 30 mL at 03/20/17 1826 .  heparin 2,500 Units in dialysis solution 1.5% low-MG/low-CA 5,000 mL dialysis solution, , Peritoneal Dialysis,  PRN, Eugenie Filler, MD .  heparin injection 5,000 Units, 5,000 Units, Subcutaneous, Q8H, Eugenie Filler, MD, 5,000 Units at 03/20/17 2133 .  hydrALAZINE (APRESOLINE) injection 10-20 mg, 10-20 mg, Intravenous, Q6H PRN, Roney Jaffe, MD, 20 mg at 03/19/17 0307 .  hydrALAZINE (APRESOLINE) tablet 100 mg, 100 mg, Oral, TID, Einar Grad, RPH, 100 mg at 03/21/17 1029 .  insulin aspart (novoLOG) injection 0-9 Units, 0-9 Units, Subcutaneous, TID WC, Eugenie Filler, MD, 7 Units at 03/21/17 [phone] .  insulin glargine (LANTUS) injection 8 Units, 8 Units, Subcutaneous, BID, Eugenie Filler, MD, 8 Units at 03/21/17 1028 .  isosorbide mononitrate (IMDUR) 24 hr tablet 15 mg, 15 mg, Oral, Daily, Irine Seal V, MD, 15 mg at 03/21/17 1035 .  levETIRAcetam (KEPPRA) tablet 250 mg, 250 mg, Oral, BID, Eugenie Filler, MD, 250 mg at 03/21/17 1028 .  LORazepam (ATIVAN) injection 1-2 mg, 1-2 mg, Intravenous, Q2H PRN, Eugenie Filler, MD, 2 mg at 03/17/17 1545 .  metoprolol (LOPRESSOR) tablet 100 mg, 100 mg, Oral, BID, Roney Jaffe, MD, 100 mg at 03/21/17 1028 .  morphine 2 MG/ML injection 1 mg, 1 mg, Intravenous, Q2H PRN, Vianne Bulls, MD, 1 mg at 03/18/17 0135 .  NIFEdipine (PROCARDIA XL/ADALAT-CC) 24 hr tablet 90 mg, 90 mg, Oral, Daily, Elmarie Shiley, MD, 90 mg at 03/21/17 1028 .  ondansetron (ZOFRAN) tablet 4 mg, 4 mg, Oral, Q6H PRN **OR** ondansetron (ZOFRAN) injection 4 mg, 4 mg, Intravenous, Q6H PRN, Lily Kocher, MD, 4 mg at 03/19/17 2211 .  pantoprazole (PROTONIX) EC tablet 40 mg, 40 mg, Oral, Q0600, Eugenie Filler, MD, 40 mg at 03/19/17 1256 .  sodium chloride flush (NS) 0.9 % injection 3 mL, 3 mL, Intravenous, Q12H, Lily Kocher, MD, 3 mL at 03/21/17 1035  Patients Current Diet: DIET DYS 2 Room service appropriate? 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 None of the above  Prior Functional Level Prior Function Level of Independence: Independent Comments: Works periodically transporting cars; 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   Self Care: Did the patient need help bathing, dressing, using the toilet or eating?  Independent  Indoor Mobility: Did the patient need assistance with walking from room to room (with or without device)? Independent  Stairs: Did  the patient need assistance with internal or external stairs (with or without device)? Independent  Functional Cognition: Did the patient need help planning regular tasks such as shopping or remembering to take medications? Independent  Current Functional Level Cognition  Arousal/Alertness: Lethargic Overall Cognitive Status: Impaired/Different from baseline Current Attention Level: Selective Orientation Level: Oriented to person, Oriented to place Following Commands: Follows one step commands consistently Safety/Judgement: Decreased awareness of safety, Decreased awareness of deficits General Comments: Pt with improved interaction today but highly impulsive and attempting to sit unsafely. Poor problem solving skills despite multimodal cues. Pt becoming agitated with therapist when given multi-step commands. Attention: Focused Focused Attention: Impaired Focused Attention Impairment: Verbal basic, Functional basic Awareness: Impaired Awareness Impairment: Emergent impairment Problem Solving: Impaired Problem Solving Impairment: Functional basic Behaviors: Restless Safety/Judgment: Impaired     Extremity Assessment (includes Sensation/Coordination)  Upper Extremity Assessment: RUE deficits/detail RUE Deficits / Details: Rt UE appears flaccid RUE Coordination: decreased fine motor, decreased gross motor  Lower Extremity Assessment: Defer to PT evaluation RLE Deficits / Details: No active movement in RLE; no response to painful stimuli in BLE.  RLE Coordination: decreased gross motor, decreased fine motor LLE Deficits / Details: No active movement in RLE; no response to painful stimuli in BLE.  LLE Coordination: decreased gross motor, decreased fine motor    ADLs  Overall ADL's : Needs assistance/impaired Eating/Feeding: NPO Grooming: Minimal assistance, Sitting Upper Body Bathing: Total assistance, Bed level Lower Body Bathing: Total assistance, Bed level Upper Body Dressing : Total assistance, Bed level Lower Body Dressing: Total assistance, Bed level Toilet Transfer: Minimal assistance, Moderate assistance, RW, Ambulation Toilet Transfer Details (indicate cue type and reason): Pt unsteady with R knee buckling. Required cues to utilize RW safely. Toileting- Clothing Manipulation and Hygiene: Total assistance, Bed level Functional mobility during ADLs: Minimal assistance, Moderate assistance, Rolling walker General ADL Comments: Pt unsteady this session with mobility and impulsive. Pt's sister and son present during session and educated them concerning need for 24/7 hands on assistance to ensure safety post-acute D/C. Family in agreement that comprehensive rehabilitation in an inpatient setting is most beneficial for pt. However, pt is adamant that he return home.    Mobility  Overal bed mobility: Needs Assistance Bed Mobility: Supine to Sit, Sit to Supine Supine to sit: Min guard Sit to supine: Min guard General bed mobility comments: No physical assistance but min guard for safety.    Transfers  Overall transfer level: Needs assistance Equipment used: None Transfers:  Sit to/from Stand Sit to Stand: Min assist General transfer comment: Min assist for trunk support.    Ambulation / Gait / Stairs / Wheelchair Mobility  Ambulation/Gait Ambulation/Gait assistance: Min assist, +2 safety/equipment Ambulation Distance (Feet): 15 Feet (15) Assistive device: None, Rolling walker (2 wheeled) Gait Pattern/deviations: Step-through pattern, Decreased weight shift to right, Staggering right, Decreased stride length General Gait Details: Amb to bathroom with no AD and minA for balance; increased lateral sway with RLE instability, requiring minA for balance; scissoring with RLE, with potential proprioceptive issues (??). Amb from bathroom to bed with RW and min guard for balance with RLE instability; required verbal cues for hand placement with RW.     Posture / Balance Dynamic Sitting Balance Sitting balance - Comments: Able to maintain sitting balance with min guard.  Balance Overall balance assessment: Needs assistance Sitting-balance support: No upper extremity supported, Feet supported Sitting balance-Leahy Scale: Fair Sitting balance - Comments: Able to maintain sitting balance with min guard.  Postural control:  Right lateral lean Standing balance-Leahy Scale: Fair Standing balance comment: Able to stand with min guard assist without UE support for static tasks.    Special needs/care consideration BiPAP/CPAP  N/a CPM  N/a Continuous Drip IV  N/a Dialysis CCPD for two years. Pt does own peritoneal dialysis Life Vest  N/a Oxygen  N/a Special Bed  N/a Trach Size  N/a Wound Vac (area)  N/a Skin peritoneal dialysis catheter with dressing Bowel mgmt: LBM 03/17/17 continent Bladder mgmt: continent Diabetic mgmt glucose 1127 on admit Hgb A1c 8.5 Pt states he has issues using Lantus as in the hospital and does better on Novulin CCPD at night   Previous Home Environment Living Arrangements:  (lives with sister, [name])  Lives With: Family Available Help at  Discharge: Family, Available 24 hours/day Type of Home: House Home Layout: Two level, Bed/bath upstairs Alternate Level Stairs-Rails: Left, Right Alternate Level Stairs-Number of Steps: 1 flight Home Access: Level entry Bathroom Shower/Tub: Multimedia programmer: Standard Bathroom Accessibility: Yes How Accessible: Accessible via wheelchair, Accessible via walker [place]: No  Discharge Living Setting Plans for Discharge Living Setting: Lives with (comment) (Lives with sister, [name]) Type of Home at Discharge: House Discharge Home Layout: Two level, Bed/bath upstairs Alternate Level Stairs-Rails: Right, Left Alternate Level Stairs-Number of Steps: flight Discharge Home Access: Level entry Discharge Bathroom Shower/Tub: Walk-in shower Discharge Bathroom Toilet: Standard Discharge Bathroom Accessibility: Yes How Accessible: Accessible via wheelchair, Accessible via walker Does the patient have any problems obtaining your medications?: No  Social/Family/Support Systems Patient Roles: Partner, Parent, Other (Comment) (works part time transporting cars for [employer]) Contact Information: [name], sister Anticipated Caregiver: sister Anticipated Ambulance person Information: see above Ability/Limitations of Caregiver: supervision level only Caregiver Availability: 24/7 Discharge Plan Discussed with Primary Caregiver: Yes Is Caregiver In Agreement with Plan?: Yes Does Caregiver/Family have Issues with Lodging/Transportation while Pt is in Rehab?: No Has girlfriend and two adult sons. Very active pta. Sister, [name], states she cares for a grandchild during the day and can not commit to 24/7 supervision. Son, Paramedic, and other sisters would have to assist with 24/7 supervision if recommended at d/c.  Goals/Additional Needs Patient/Family Goal for Rehab: MOd I to supervision with PT, OT, and SLP Expected length of stay: [facility] 8-12 days Equipment Needs: CCPD  at night. Pt does own CCPD at home,  Pt/Family Agrees to Admission and willing to participate: Yes Program Orientation Provided & Reviewed with Pt/Caregiver Including Roles  & Responsibilities: Yes  Decrease burden of Care through IP rehab admission: n/a  Possible need for SNF placement upon discharge: not anticipated. Can not d/c to SNF on peritoneal dialysis. Would have to be switched to hemodialysis if SNF d/c dispo.  Patient Condition: This patient's condition remains as documented in the consult dated 03/20/2017 in which the Rehabilitation Physician determined and documented that the patient's condition is appropriate for intensive rehabilitative care in an inpatient rehabilitation facility. Will admit to inpatient rehab today.  Preadmission Screen Completed By:  Cleatrice Burke, 03/21/2017 11:38 AM ______________________________________________________________________   Discussed status with Dr. Posey Pronto on 03/21/2017 at  1138 and received telephone approval for admission today.  Admission Coordinator:  Cleatrice Burke, time 0962 Date 03/21/2017.

## 2017-03-21 ENCOUNTER — Inpatient Hospital Stay (HOSPITAL_COMMUNITY)
Admission: RE | Admit: 2017-03-21 | Discharge: 2017-03-27 | DRG: 091 | Disposition: A | Discharge status: Home Health | Payer: Medicare Other | Source: Intra-hospital | Attending: Physical Medicine & Rehabilitation | Admitting: Physical Medicine & Rehabilitation

## 2017-03-21 DIAGNOSIS — C61 Malignant neoplasm of prostate: Secondary | ICD-10-CM | POA: Diagnosis not present

## 2017-03-21 DIAGNOSIS — Z8546 Personal history of malignant neoplasm of prostate: Secondary | ICD-10-CM

## 2017-03-21 DIAGNOSIS — R4189 Other symptoms and signs involving cognitive functions and awareness: Secondary | ICD-10-CM

## 2017-03-21 DIAGNOSIS — E440 Moderate protein-calorie malnutrition: Secondary | ICD-10-CM | POA: Diagnosis not present

## 2017-03-21 DIAGNOSIS — N186 End stage renal disease: Secondary | ICD-10-CM

## 2017-03-21 DIAGNOSIS — D509 Iron deficiency anemia, unspecified: Secondary | ICD-10-CM | POA: Diagnosis not present

## 2017-03-21 DIAGNOSIS — Z992 Dependence on renal dialysis: Secondary | ICD-10-CM

## 2017-03-21 DIAGNOSIS — R5081 Fever presenting with conditions classified elsewhere: Secondary | ICD-10-CM

## 2017-03-21 DIAGNOSIS — R5381 Other malaise: Secondary | ICD-10-CM | POA: Diagnosis not present

## 2017-03-21 DIAGNOSIS — M898X9 Other specified disorders of bone, unspecified site: Secondary | ICD-10-CM | POA: Diagnosis not present

## 2017-03-21 DIAGNOSIS — D649 Anemia, unspecified: Secondary | ICD-10-CM | POA: Diagnosis not present

## 2017-03-21 DIAGNOSIS — Z5189 Encounter for other specified aftercare: Secondary | ICD-10-CM | POA: Diagnosis not present

## 2017-03-21 DIAGNOSIS — Z794 Long term (current) use of insulin: Secondary | ICD-10-CM

## 2017-03-21 DIAGNOSIS — R05 Cough: Secondary | ICD-10-CM

## 2017-03-21 DIAGNOSIS — I129 Hypertensive chronic kidney disease with stage 1 through stage 4 chronic kidney disease, or unspecified chronic kidney disease: Secondary | ICD-10-CM | POA: Diagnosis not present

## 2017-03-21 DIAGNOSIS — E1122 Type 2 diabetes mellitus with diabetic chronic kidney disease: Secondary | ICD-10-CM | POA: Diagnosis not present

## 2017-03-21 DIAGNOSIS — I808 Phlebitis and thrombophlebitis of other sites: Secondary | ICD-10-CM

## 2017-03-21 DIAGNOSIS — G9341 Metabolic encephalopathy: Secondary | ICD-10-CM | POA: Diagnosis not present

## 2017-03-21 DIAGNOSIS — E1142 Type 2 diabetes mellitus with diabetic polyneuropathy: Secondary | ICD-10-CM | POA: Diagnosis not present

## 2017-03-21 DIAGNOSIS — D638 Anemia in other chronic diseases classified elsewhere: Secondary | ICD-10-CM

## 2017-03-21 DIAGNOSIS — N2581 Secondary hyperparathyroidism of renal origin: Secondary | ICD-10-CM | POA: Diagnosis not present

## 2017-03-21 DIAGNOSIS — Z87891 Personal history of nicotine dependence: Secondary | ICD-10-CM

## 2017-03-21 DIAGNOSIS — E1129 Type 2 diabetes mellitus with other diabetic kidney complication: Secondary | ICD-10-CM | POA: Diagnosis not present

## 2017-03-21 DIAGNOSIS — I120 Hypertensive chronic kidney disease with stage 5 chronic kidney disease or end stage renal disease: Secondary | ICD-10-CM

## 2017-03-21 DIAGNOSIS — E1169 Type 2 diabetes mellitus with other specified complication: Secondary | ICD-10-CM

## 2017-03-21 DIAGNOSIS — R569 Unspecified convulsions: Secondary | ICD-10-CM

## 2017-03-21 DIAGNOSIS — G40909 Epilepsy, unspecified, not intractable, without status epilepticus: Secondary | ICD-10-CM

## 2017-03-21 DIAGNOSIS — K769 Liver disease, unspecified: Secondary | ICD-10-CM | POA: Diagnosis not present

## 2017-03-21 DIAGNOSIS — Z8673 Personal history of transient ischemic attack (TIA), and cerebral infarction without residual deficits: Secondary | ICD-10-CM

## 2017-03-21 DIAGNOSIS — D631 Anemia in chronic kidney disease: Secondary | ICD-10-CM | POA: Diagnosis not present

## 2017-03-21 DIAGNOSIS — R4781 Slurred speech: Secondary | ICD-10-CM | POA: Diagnosis not present

## 2017-03-21 DIAGNOSIS — Z79899 Other long term (current) drug therapy: Secondary | ICD-10-CM

## 2017-03-21 DIAGNOSIS — D62 Acute posthemorrhagic anemia: Secondary | ICD-10-CM

## 2017-03-21 DIAGNOSIS — E1165 Type 2 diabetes mellitus with hyperglycemia: Secondary | ICD-10-CM | POA: Diagnosis not present

## 2017-03-21 DIAGNOSIS — I10 Essential (primary) hypertension: Secondary | ICD-10-CM

## 2017-03-21 DIAGNOSIS — E8770 Fluid overload, unspecified: Secondary | ICD-10-CM | POA: Diagnosis not present

## 2017-03-21 DIAGNOSIS — E785 Hyperlipidemia, unspecified: Secondary | ICD-10-CM

## 2017-03-21 DIAGNOSIS — N189 Chronic kidney disease, unspecified: Secondary | ICD-10-CM | POA: Diagnosis present

## 2017-03-21 DIAGNOSIS — G9340 Encephalopathy, unspecified: Secondary | ICD-10-CM

## 2017-03-21 LAB — GLUCOSE, CAPILLARY
GLUCOSE-CAPILLARY: 271 mg/dL — AB (ref 65–99)
GLUCOSE-CAPILLARY: 310 mg/dL — AB (ref 65–99)
GLUCOSE-CAPILLARY: 217 mg/dL — AB (ref 65–99)
GLUCOSE-CAPILLARY: 148 mg/dL — AB (ref 65–99)
GLUCOSE-CAPILLARY: 159 mg/dL — AB (ref 65–99)
Glucose-Capillary: 231 mg/dL — ABNORMAL HIGH (ref 65–99)

## 2017-03-21 LAB — CBC
HCT: 24.6 % — ABNORMAL LOW (ref 39.0–52.0)
Hemoglobin: 8.4 g/dL — ABNORMAL LOW (ref 13.0–17.0)
MCH: 27.7 pg (ref 26.0–34.0)
MCHC: 34.1 g/dL (ref 30.0–36.0)
MCV: 81.2 fL (ref 78.0–100.0)
PLATELETS: 284 10*3/uL (ref 150–400)
RBC: 3.03 MIL/uL — ABNORMAL LOW (ref 4.22–5.81)
RDW: 15.6 % — AB (ref 11.5–15.5)
WBC: 6.2 10*3/uL (ref 4.0–10.5)

## 2017-03-21 LAB — RENAL FUNCTION PANEL
ANION GAP: 12 (ref 5–15)
Albumin: 1.7 g/dL — ABNORMAL LOW (ref 3.5–5.0)
BUN: 42 mg/dL — AB (ref 6–20)
CALCIUM: 7.8 mg/dL — AB (ref 8.9–10.3)
CO2: 25 mmol/L (ref 22–32)
Chloride: 92 mmol/L — ABNORMAL LOW (ref 101–111)
Creatinine, Ser: 9.18 mg/dL — ABNORMAL HIGH (ref 0.61–1.24)
GFR calc Af Amer: 6 mL/min — ABNORMAL LOW (ref >60)
GFR calc non Af Amer: 5 mL/min — ABNORMAL LOW (ref >60)
GLUCOSE: 321 mg/dL — AB (ref 65–99)
PHOSPHORUS: 6.6 mg/dL — AB (ref 2.5–4.6)
Potassium: 3.8 mmol/L (ref 3.5–5.1)
SODIUM: 129 mmol/L — AB (ref 135–145)

## 2017-03-21 MED ORDER — HEPARIN SODIUM (PORCINE) 5000 UNIT/ML IJ SOLN: 5000.0000 [IU] | Freq: Three times a day (TID) | INTRAMUSCULAR | Status: DC

## 2017-03-21 MED ORDER — INSULIN GLARGINE 100 UNIT/ML ~~LOC~~ SOLN
8.0000 [IU] | Freq: Two times a day (BID) | SUBCUTANEOUS | Status: DC
  Administered 2017-03-21 – 2017-03-23 (×4): 8 [IU] via SUBCUTANEOUS
  Filled 2017-03-21 (×5): qty 0.08

## 2017-03-21 MED ORDER — INSULIN ASPART 100 UNIT/ML ~~LOC~~ SOLN
0.0000 [IU] | Freq: Three times a day (TID) | SUBCUTANEOUS | Status: DC
  Administered 2017-03-21 – 2017-03-22 (×3): 1 [IU] via SUBCUTANEOUS
  Administered 2017-03-23: 8 [IU] via SUBCUTANEOUS
  Administered 2017-03-23: 9 [IU] via SUBCUTANEOUS
  Administered 2017-03-24: 3 [IU] via SUBCUTANEOUS

## 2017-03-21 MED ORDER — PANTOPRAZOLE SODIUM 40 MG PO TBEC
40.0000 mg | DELAYED_RELEASE_TABLET | Freq: Every day | ORAL | Status: DC
  Administered 2017-03-22 – 2017-03-27 (×6): 40 mg via ORAL
  Filled 2017-03-21 (×6): qty 1

## 2017-03-21 MED ORDER — NIFEDIPINE ER OSMOTIC RELEASE 90 MG PO TB24
90.0000 mg | ORAL_TABLET | Freq: Every day | ORAL | Status: DC
  Administered 2017-03-22 – 2017-03-27 (×6): 90 mg via ORAL
  Filled 2017-03-21 (×6): qty 1

## 2017-03-21 MED ORDER — ONDANSETRON HCL 4 MG PO TABS
4.0000 mg | ORAL_TABLET | Freq: Four times a day (QID) | ORAL | Status: DC | PRN
  Administered 2017-03-25: 4 mg via ORAL
  Filled 2017-03-21 (×2): qty 1

## 2017-03-21 MED ORDER — SORBITOL 70 % SOLN: 30.0000 mL | Freq: Every day | Status: DC | PRN

## 2017-03-21 MED ORDER — ONDANSETRON HCL 4 MG/2ML IJ SOLN: 4.0000 mg | Freq: Four times a day (QID) | INTRAMUSCULAR | Status: DC | PRN

## 2017-03-21 MED ORDER — ACETAMINOPHEN 650 MG RE SUPP: 650.0000 mg | Freq: Four times a day (QID) | RECTAL | Status: DC | PRN

## 2017-03-21 MED ORDER — DARBEPOETIN ALFA 150 MCG/0.3ML IJ SOSY
150.0000 ug | PREFILLED_SYRINGE | INTRAMUSCULAR | Status: DC
  Filled 2017-03-21: qty 0.3

## 2017-03-21 MED ORDER — ACETAMINOPHEN 325 MG PO TABS
650.0000 mg | ORAL_TABLET | Freq: Four times a day (QID) | ORAL | Status: DC | PRN
  Administered 2017-03-26: 650 mg via ORAL
  Filled 2017-03-21: qty 2

## 2017-03-21 MED ORDER — METOPROLOL TARTRATE 50 MG PO TABS
100.0000 mg | ORAL_TABLET | Freq: Two times a day (BID) | ORAL | Status: DC
  Administered 2017-03-21 – 2017-03-27 (×12): 100 mg via ORAL
  Filled 2017-03-21 (×2): qty 2
  Filled 2017-03-21: qty 4
  Filled 2017-03-21 (×9): qty 2

## 2017-03-21 MED ORDER — HEPARIN SODIUM (PORCINE) 5000 UNIT/ML IJ SOLN
5000.0000 [IU] | Freq: Three times a day (TID) | INTRAMUSCULAR | Status: DC
  Administered 2017-03-21 – 2017-03-27 (×18): 5000 [IU] via SUBCUTANEOUS
  Filled 2017-03-21 (×18): qty 1

## 2017-03-21 MED ORDER — HEPARIN 1000 UNIT/ML FOR PERITONEAL DIALYSIS
INTRAPERITONEAL | Status: DC | PRN
  Filled 2017-03-21: qty 5000

## 2017-03-21 MED ORDER — LORAZEPAM 2 MG/ML IJ SOLN: 1.0000 mg | INTRAMUSCULAR | Status: DC | PRN

## 2017-03-21 MED ORDER — DARBEPOETIN ALFA 150 MCG/0.3ML IJ SOSY
150.0000 ug | PREFILLED_SYRINGE | Freq: Once | INTRAMUSCULAR | Status: AC
  Administered 2017-03-21: 150 ug via SUBCUTANEOUS
  Filled 2017-03-21: qty 0.3

## 2017-03-21 MED ORDER — LEVETIRACETAM 250 MG PO TABS
250.0000 mg | ORAL_TABLET | Freq: Two times a day (BID) | ORAL | Status: DC
  Administered 2017-03-21 – 2017-03-27 (×12): 250 mg via ORAL
  Filled 2017-03-21 (×12): qty 1

## 2017-03-21 MED ORDER — DARBEPOETIN ALFA 150 MCG/0.3ML IJ SOSY: 150.0000 ug | PREFILLED_SYRINGE | INTRAMUSCULAR | Status: DC

## 2017-03-21 MED ORDER — HYDRALAZINE HCL 50 MG PO TABS
100.0000 mg | ORAL_TABLET | Freq: Three times a day (TID) | ORAL | Status: DC
  Administered 2017-03-21 – 2017-03-27 (×18): 100 mg via ORAL
  Filled 2017-03-21 (×18): qty 2

## 2017-03-21 MED ORDER — ISOSORBIDE MONONITRATE ER 30 MG PO TB24
15.0000 mg | ORAL_TABLET | Freq: Every day | ORAL | Status: DC
  Administered 2017-03-22 – 2017-03-23 (×2): 15 mg via ORAL
  Filled 2017-03-21 (×2): qty 1

## 2017-03-21 NOTE — Progress Notes (Signed)
Cristina Gong, RN Rehab Admission Coordinator Signed Physical Medicine and Rehabilitation  PMR Pre-admission Date of Service: 03/20/2017 4:37 PM  Related encounter: ED to Hosp-Admission (Current) from 03/13/2017 in [facility]       [] Hide copied text PMR Admission Coordinator Pre-Admission Assessment  Patient: Jared [name] is an 61 y.o., male MRN: [id number] DOB: 09-Oct-1956 Height: 5\' 10"  (177.8 cm) Weight: 76.2 kg (167 lb 15.9 oz)                                                                                                                                                  Insurance Information HMO:     PPO:      PCP:      IPA:      80/20: yes     OTHER:  No HMO PRIMARY: Medicare a and b      Policy#: [id number]      Subscriber: pt Benefits:  Phone #: passport one online     Name: 03/20/2017 Eff. Date: 06/27/2014     Deduct: $1340      Out of Pocket Max: none      Life Max: none CIR: 100%      SNF: 20 full days Outpatient: 80%     Co-Pay: 20% Home Health: 100%      Co-Pay: none DME: 80%     Co-Pay: 20% Providers: pt choice  SECONDARY: none      Policy#:       Subscriber:   Medicaid Application Date:       Case Manager:  Disability Application Date:       Case Worker:   Emergency Contact Information        Contact Information    Name Relation Home Work Mobile   [place] Sister [phone]  [phone]   Kahleel, Fadeley The Third Son   [phone]   Clifton,Linda Sister   [phone]   Holdyn, Poyser Sister   [phone]   Nelida Gores   [phone]     Current Medical History  Patient Admitting Diagnosis: seizure due to hyperglycemia, encephalopathy due to questionable infectious etiology  History of Present Illness:  HPI: Jared [name] a 61 y.o.right hand malewith history of end-stage renal disease with peritoneal dialysis, hypertension, diabetes mellitus, prostate cancer and prior seizure  disorder.  Presented 03/13/2017 with altered mental status and slurred speech. Denied any recent trauma. Lactic acid 1.35, sodium 122, potassium 5.6, creatinine 9.88. MRI of the brain negative. MRA unremarkable. CT of the abdomen showed moderate volume of ascites stable as compared to January 2018. EEG negative for seizure. Low-grade fever infectious disease consult did blood cultures negative etiology of syndrome of unclear in regards to fever. Neurology follow-up suspect acute encephalopathy. Follow-up renal services in regards to dialysis. Occupational therapy 4/24/2018no issue in regards to generalized weakness decreased safety risk of  falls.   Past Medical History      Past Medical History:  Diagnosis Date  . Anemia   . Chronic kidney disease    on dialysis M-W-F  . Diabetes mellitus without complication ([place])    onset as adult  . Hypertension   . Seizures ([place])   . Stroke ([place])   . Thyroid disease    hyperparathyroidism    Family History  family history includes Cancer - Lung in his mother; Cancer - Other in his father.  Prior Rehab/Hospitalizations:  Has the patient had major surgery during 100 days prior to admission? No  CIR 11/2016 went home at supervision to Mod I level. Did drive at discharge. [facility] was arranged at d/c, but pt dismissed them for felt not needed. Did not follow up with Dr. Naaman Plummer as an outpatient. Pt did follow up with Neurology at discharge and new PCP.  Current Medications   Current Facility-Administered Medications:  .  acetaminophen (TYLENOL) tablet 650 mg, 650 mg, Oral, Q6H PRN **OR** acetaminophen (TYLENOL) suppository 650 mg, 650 mg, Rectal, Q6H PRN, Lily Kocher, MD, 650 mg at 03/14/17 2356 .  Darbepoetin Alfa (ARANESP) injection 150 mcg, 150 mcg, Subcutaneous, Q Tue-1800, Ihor Austin, RPH .  dextrose 50 % solution 25 mL, 25 mL, Intravenous, PRN, Veryl Speak, MD, 25 mL at 03/20/17 1433 .  dialysis solution 1.5%  low-MG/low-CA dianeal solution, , Intraperitoneal, Q24H, Eugenie Filler, MD, 15,000 mL at 03/20/17 2254 .  gentamicin cream (GARAMYCIN) 0.1 % 1 application, 1 application, Topical, Daily, Roney Jaffe, MD, 1 application at 42/70/62 [phone] .  gi cocktail (Maalox,Lidocaine,Donnatal), 30 mL, Oral, TID PRN, Irine Seal V, MD, 30 mL at 03/20/17 1826 .  heparin 2,500 Units in dialysis solution 1.5% low-MG/low-CA 5,000 mL dialysis solution, , Peritoneal Dialysis, PRN, Eugenie Filler, MD .  heparin injection 5,000 Units, 5,000 Units, Subcutaneous, Q8H, Eugenie Filler, MD, 5,000 Units at 03/20/17 2133 .  hydrALAZINE (APRESOLINE) injection 10-20 mg, 10-20 mg, Intravenous, Q6H PRN, Roney Jaffe, MD, 20 mg at 03/19/17 0307 .  hydrALAZINE (APRESOLINE) tablet 100 mg, 100 mg, Oral, TID, Einar Grad, RPH, 100 mg at 03/21/17 1029 .  insulin aspart (novoLOG) injection 0-9 Units, 0-9 Units, Subcutaneous, TID WC, Eugenie Filler, MD, 7 Units at 03/21/17 [phone] .  insulin glargine (LANTUS) injection 8 Units, 8 Units, Subcutaneous, BID, Eugenie Filler, MD, 8 Units at 03/21/17 1028 .  isosorbide mononitrate (IMDUR) 24 hr tablet 15 mg, 15 mg, Oral, Daily, Irine Seal V, MD, 15 mg at 03/21/17 1035 .  levETIRAcetam (KEPPRA) tablet 250 mg, 250 mg, Oral, BID, Eugenie Filler, MD, 250 mg at 03/21/17 1028 .  LORazepam (ATIVAN) injection 1-2 mg, 1-2 mg, Intravenous, Q2H PRN, Eugenie Filler, MD, 2 mg at 03/17/17 1545 .  metoprolol (LOPRESSOR) tablet 100 mg, 100 mg, Oral, BID, Roney Jaffe, MD, 100 mg at 03/21/17 1028 .  morphine 2 MG/ML injection 1 mg, 1 mg, Intravenous, Q2H PRN, Vianne Bulls, MD, 1 mg at 03/18/17 0135 .  NIFEdipine (PROCARDIA XL/ADALAT-CC) 24 hr tablet 90 mg, 90 mg, Oral, Daily, Elmarie Shiley, MD, 90 mg at 03/21/17 1028 .  ondansetron (ZOFRAN) tablet 4 mg, 4 mg, Oral, Q6H PRN **OR** ondansetron (ZOFRAN) injection 4 mg, 4 mg, Intravenous, Q6H PRN, Lily Kocher, MD, 4 mg at 03/19/17  2211 .  pantoprazole (PROTONIX) EC tablet 40 mg, 40 mg, Oral, Q0600, Eugenie Filler, MD, 40 mg at 03/19/17 1256 .  sodium chloride flush (NS) 0.9 % injection 3 mL, 3 mL, Intravenous, Q12H, Lily Kocher, MD, 3 mL at 03/21/17 1035  Patients Current Diet: DIET DYS 2 Room service appropriate? 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 None of the above  Prior Functional Level Prior Function Level of Independence: Independent Comments: Works periodically transporting cars; 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   Self Care: Did the patient need help bathing, dressing, using the toilet or eating?  Independent  Indoor Mobility: Did the patient need assistance with walking from room to room (with or without device)? Independent  Stairs: Did the patient need assistance with internal or external stairs (with or without device)? Independent  Functional Cognition: Did the patient need help planning regular tasks such as shopping or remembering to take medications? Independent  Current Functional Level Cognition  Arousal/Alertness: Lethargic Overall Cognitive Status: Impaired/Different from baseline Current Attention Level: Selective Orientation Level: Oriented to person, Oriented to place Following Commands: Follows one step commands consistently Safety/Judgement: Decreased awareness of safety, Decreased awareness of deficits General Comments: Pt with improved  interaction today but highly impulsive and attempting to sit unsafely. Poor problem solving skills despite multimodal cues. Pt becoming agitated with therapist when given multi-step commands. Attention: Focused Focused Attention: Impaired Focused Attention Impairment: Verbal basic, Functional basic Awareness: Impaired Awareness Impairment: Emergent impairment Problem Solving: Impaired Problem Solving Impairment: Functional basic Behaviors: Restless Safety/Judgment: Impaired    Extremity Assessment (includes Sensation/Coordination)  Upper Extremity Assessment: RUE deficits/detail RUE Deficits / Details: Rt UE appears flaccid RUE Coordination: decreased fine motor, decreased gross motor  Lower Extremity Assessment: Defer to PT evaluation RLE Deficits / Details: No active movement in RLE; no response to painful stimuli in BLE.  RLE Coordination: decreased gross motor, decreased fine motor LLE Deficits / Details: No active movement in RLE; no response to painful stimuli in BLE.  LLE Coordination: decreased gross motor, decreased fine motor    ADLs  Overall ADL's : Needs assistance/impaired Eating/Feeding: NPO Grooming: Minimal assistance, Sitting Upper Body Bathing: Total assistance, Bed level Lower Body Bathing: Total assistance, Bed level Upper Body Dressing : Total assistance, Bed level Lower Body Dressing: Total assistance, Bed level Toilet Transfer: Minimal assistance, Moderate assistance, RW, Ambulation Toilet Transfer Details (indicate cue type and reason): Pt unsteady with R knee buckling. Required cues to utilize RW safely. Toileting- Clothing Manipulation and Hygiene: Total assistance, Bed level Functional mobility during ADLs: Minimal assistance, Moderate assistance, Rolling walker General ADL Comments: Pt unsteady this session with mobility and impulsive. Pt's sister and son present during session and educated them concerning need for 24/7 hands on assistance to ensure  safety post-acute D/C. Family in agreement that comprehensive rehabilitation in an inpatient setting is most beneficial for pt. However, pt is adamant that he return home.    Mobility  Overal bed mobility: Needs Assistance Bed Mobility: Supine to Sit, Sit to Supine Supine to sit: Min guard Sit to supine: Min guard General bed mobility comments: No physical assistance but min guard for safety.  Transfers  Overall transfer level: Needs assistance Equipment used: None Transfers: Sit to/from Stand Sit to Stand: Min assist General transfer comment: Min assist for trunk support.    Ambulation / Gait / Stairs / Wheelchair Mobility  Ambulation/Gait Ambulation/Gait assistance: Min assist, +2 safety/equipment Ambulation Distance (Feet): 15 Feet (15) Assistive device: None, Rolling walker (2 wheeled) Gait Pattern/deviations: Step-through pattern, Decreased weight shift to right, Staggering right, Decreased stride length General Gait Details: Amb to bathroom with no AD and minA for balance; increased lateral sway with RLE instability, requiring minA for balance; scissoring with RLE, with potential proprioceptive issues (??). Amb from bathroom to bed with RW and min guard for balance with RLE instability; required verbal cues for hand placement with RW.     Posture / Balance Dynamic Sitting Balance Sitting balance - Comments: Able to maintain sitting balance with min guard.  Balance Overall balance assessment: Needs assistance Sitting-balance support: No upper extremity supported, Feet supported Sitting balance-Leahy Scale: Fair Sitting balance - Comments: Able to maintain sitting balance with min guard.  Postural control: Right lateral lean Standing balance-Leahy Scale: Fair Standing balance comment: Able to stand with min guard assist without UE support for static tasks.    Special needs/care consideration BiPAP/CPAP  N/a CPM  N/a Continuous Drip IV  N/a Dialysis CCPD for two  years. Pt does own peritoneal dialysis Life Vest  N/a Oxygen  N/a Special Bed  N/a Trach Size  N/a Wound Vac (area)  N/a Skin peritoneal dialysis catheter with dressing Bowel mgmt: LBM 03/17/17 continent Bladder mgmt: continent Diabetic mgmt glucose 1127 on admit Hgb A1c 8.5 Pt states he has issues using Lantus as in the hospital and does better on Novulin CCPD at night   Previous Home Environment Living Arrangements:  (lives with sister, [name])  Lives With: Family Available Help at Discharge: Family, Available 24 hours/day Type of Home: House Home Layout: Two level, Bed/bath upstairs Alternate Level Stairs-Rails: Left, Right Alternate Level Stairs-Number of Steps: 1 flight Home Access: Level entry Bathroom Shower/Tub: Multimedia programmer: Standard Bathroom Accessibility: Yes How Accessible: Accessible via wheelchair, Accessible via walker [place]: No  Discharge Living Setting Plans for Discharge Living Setting: Lives with (comment) (Lives with sister, [name]) Type of Home at Discharge: House Discharge Home Layout: Two level, Bed/bath upstairs Alternate Level Stairs-Rails: Right, Left Alternate Level Stairs-Number of Steps: flight Discharge Home Access: Level entry Discharge Bathroom Shower/Tub: Walk-in shower Discharge Bathroom Toilet: Standard Discharge Bathroom Accessibility: Yes How Accessible: Accessible via wheelchair, Accessible via walker Does the patient have any problems obtaining your medications?: No  Social/Family/Support Systems Patient Roles: Partner, Parent, Other (Comment) (works part time transporting cars for [employer]) Contact Information: [name], sister Anticipated Caregiver: sister Anticipated Ambulance person Information: see above Ability/Limitations of Caregiver: supervision level only Caregiver Availability: 24/7 Discharge Plan Discussed with Primary Caregiver: Yes Is Caregiver In Agreement with Plan?:  Yes Does Caregiver/Family have Issues with Lodging/Transportation while Pt is in Rehab?: No Has girlfriend and two adult sons. Very active pta. Sister, [name], states she cares for a grandchild during the day and can not commit to 24/7 supervision. Son, Paramedic, and other sisters would have to assist with 24/7 supervision if recommended at d/c.  Goals/Additional Needs Patient/Family Goal for Rehab: MOd I to supervision with PT, OT, and SLP Expected length of stay: [facility] 8-12 days Equipment Needs: CCPD at night. Pt does own CCPD at home,  Pt/Family Agrees to Admission and willing to participate: Yes Program Orientation  Provided & Reviewed with Pt/Caregiver Including Roles  & Responsibilities: Yes  Decrease burden of Care through IP rehab admission: n/a  Possible need for SNF placement upon discharge: not anticipated. Can not d/c to SNF on peritoneal dialysis. Would have to be switched to hemodialysis if SNF d/c dispo.  Patient Condition: This patient's condition remains as documented in the consult dated 03/20/2017 in which the Rehabilitation Physician determined and documented that the patient's condition is appropriate for intensive rehabilitative care in an inpatient rehabilitation facility. Will admit to inpatient rehab today.  Preadmission Screen Completed By:  Cleatrice Burke, 03/21/2017 11:38 AM ______________________________________________________________________   Discussed status with Dr. Posey Pronto on 03/21/2017 at  1138 and received telephone approval for admission today.  Admission Coordinator:  Cleatrice Burke, time 4458 Date 03/21/2017.       Cosigned by: Ankit Lorie Phenix, MD at 03/21/2017 12:15 PM  Revision History

## 2017-03-21 NOTE — Discharge Summary (Signed)
Physician Discharge Summary  Jared [name] OZD:[id number] DOB: October 04, 1956 DOA: 03/13/2017  PCP: Jared Dingwall, NP-C  Admit date: 03/13/2017 Discharge date: 03/21/2017  Time spent: 65 minutes  Recommendations for Outpatient Follow-up:  1. Patient be discharged to inpatient rehabilitation. Patient will need systolic pressure pressure reassessed. Renal panel per nephrology. 2. Follow-up with Jared Dingwall, NP-C in 1-2 weeks. On follow-up patient will need a basic metabolic profile done as well as albumin to follow-up on electrolytes and renal function 3.    Discharge Diagnoses:  Principal Problem:   Acute encephalopathy Active Problems:   Witnessed seizure-like activity (HCC)   Seizure ([place])   CKD (chronic kidney disease) stage V requiring chronic dialysis ([place])   Diabetes mellitus due to underlying condition, uncontrolled, without complication, with long-term current use of insulin (HCC)   Prostate cancer (HCC)   Hyperglycemia   Accelerated hypertension   Fever   Weakness   Global aphasia   Slurred speech   FUO (fever of unknown origin)   Discharge Condition: Stable and improved  Diet recommendation: Dysphagia 2 diet  Filed Weights   03/20/17 0800 03/20/17 1830 03/20/17 2228  Weight: 76.6 kg (168 lb 12.8 oz) 76.5 kg (168 lb 11.2 oz) 76.2 kg (167 lb 15.9 oz)    History of present illness:  Per Dr Jared [name] is a 61 y.o. gentleman with ESRD on peritoneal dialysis, HTN, IDDM, chronic cough, recent initiation of treatment for prostate cancer, and prior seizure in the setting of hyperglycemia who was brought to the ED on day of admission by EMS for evaluation of acute mental status changes.  The patient was altered and unable to communicate or give any meaningful history.  HPI taken from family members.  According to his sister, with whom he lives, he was last known to be in his baseline state of health the afternoon of admission.  He accidentally locked himself out of  the house and had to call her to get in.  He was not outside very long.  Shortly after that, she witnessed acute word finding difficulty and slowed speech.  His speech was comprehensible but inappropriate.  He was disoriented.  She noticed shaking in his left hand, but no other stigmata of seizure activity at home.  After about 20 minutes of no improvement, she called 911.  No falls, no head trauma, no syncope.  No complaints chest pain or shortness of breath.  No fever, nausea, vomiting, URI symptoms.  Of note, he has had problems with gait instability, prior to today.  He has complained of his legs "feeling heavy" and having a shuffling gait.  According to family, no reason to think that he has not been compliant with his home medications or his dialysis regimen.  At baseline, he is completely independent and still drives.  ED Course: Urinary incontinence noted in the ED.  The patient was too restless to comply with initial attempts for EKG and head CT.  He received a total of 2mg  of IV ativan.  Lactic acid level normal at 1.35.  Sodium 122, potassium 5.6, bicarb 20, BUN 55, Creatinine 9.88, glucose 1127.  WBC count 2.7, Hgb 8.9.  The patient received a 500cc NS bolus and insulin drip was ordered.  Hospitalist asked to   Hospital Course:  #1 acute encephalopathy/fever Questionable etiology. Patient with significant improvement and likely close to baseline by day of discharge.  Patient noted on presentation to have a blood sugar of greater than 1000 and also noted to have  high fevers. MAXIMUM TEMPERATURE of 103.Also concerns for seizures. Fever curve trended down. Patient was placed on the glucose stabilizer and subsequently transitioned off to Lantus. Blood sugars improved from admission and fluctuated during the hospitalization. MRI MRA head negative for any acute abnormalities. Patient has been pancultured results pending with no growth to date. Peritoneal fluid was cultured results with no  growth to date. Patient improved on a daily basis. Fever curve trended down. EEG done 03/14/2017 with diffuse slowing of waking background which is nonspecific, absence of epileptiform discharges. LP was attempted by neurology however due to patient's inability to stay still it was aborted. LP attempted under fluoroscopy which had to be aborted as well due to agitation despite patient getting Ativan and Haldol. CT abdomen and pelvis with no source of infection noted. Repeat MRI brain limited by motion however no acute abnormalities. Patient improved clinically daily. Patient with an improving global aphasia. Patient remained afebrile. Patient was initially on IV Fortaz, IV Diflucan, IV vancomycin. Nephrology was consulted due to ongoing back pain. Patient had presented with a worsening pain and a such patient hydrated with IV fluids. Patient was seen in consultation by ID and nephrology the hospitalization. On 03/19/2017 Foley catheter was recommended to be discontinued per ID wreaks. IV antibiotics were also all subsequently discontinued and patient to be monitored per ID. Patient with no further fevers.  #2 diabetes mellitus uncontrolled and on insulin-dependent diabetes mellitus Blood sugars on admission was 1127. ?? Etiology. Concern for infectious etiology as patient noted to have fevers up to 103. Patient has been pancultured and results pending. Peritoneal fluid has been cultured with no growth to date. Patient was placed on glucose stabilizer and has currently been transitioned to subcutaneous insulin. Patient noted to have a blood glucose of 80 -137.Continue Lantus to 8 units twice daily. Discontinued D5 normal saline. Follow CBGs and adjust insulin as needed.   #3 malignant hypertension Patient noted on admission to have systolic blood pressures in the 200s. Patient with no significant improvement with IV Lopressor and IV enalapril. Discontinued IV Lopressor and IV enalapril, Nitropaste.  Significant improvement of blood pressure on Cardene drip earlier on which was discontinued. Patient was placed on oral antihypertensive medications. Systolic blood pressures in the 170s to the 200s, despite oral regimen of Norvasc, Lopressor, hydralazine, and addition of Imdur. Patient was started back on the Cardene drip. Patient's Norvasc was discontinued and patient placed on Procardia. Cardene drip was subsequently weaned off with improvement with patient's blood pressure. Patient currently on hydralazine,imdur, Lopressor, Procardia. Outpatient follow-up.  #4 global aphasia Patient with a global aphasia early on in the hospitalization as his mental status was clear in which has improved. Patient also noted to have a right facial droop however unknown whether this is chronic. Patient with some right upper extremity weakness which improved. Aphasia improved and had resolved by day of discharge . Patient noted to have seizure-like activity on 03/16/2017, and placed on Keppra per neurology recommendations. Patient with no further seizures noted. Repeat MRI brain ordered and limited by motion however no acute abnormalities noted. Neurology followed the patient throughout the hospitalization. Outpatient follow-up with neurology.   #5 seizure-like activity Patient was noted to have 2 episodes of seizures noted per nursing on 03/16/2017. Patient started on IV Keppra daily. Patient on Ativan when necessary. EEG x 2 was negative. Repeat MRI brain limited by motion however no acute abnormalities noted. Patient did not have any further seizure-like episodes on IV Keppra and patient  transitioned to oral Keppra to 250 mg by mouth twice a day per neurology recommendations. Patient will need outpatient neurology follow-up. No driving for at least 6 months or until cleared by neurology. Patient will be discharged to inpatient rehabilitation.  #6 end-stage renal disease on peroteneal dialysis Nephrology was consulted  during the hospitalization and followed the patient during the hospitalization. Patient received his peritoneal dialysis. Patient will be discharged to inpatient rehabilitation. Per nephrology.  #7 history of prostate cancer On Lupron injections. Outpatient follow-up with urology.  #8 anemia Hemoglobin remained stable. Follow H&H.  #9 diabetes mellitus type 2 Patient was initially placed on half home dose Lantus after his significant hyperglycemia on admission was corrected with the glucose stabilizer. Patient was monitored. Patient was also maintained on a sliding scale insulin. Patient noted to have significant fluctuations in patient blood glucose levels. Patient's Lantus doses were initially adjusted and increased as high as 10 units twice daily however patient was noted to have CBGs in the 30s to 40s and subsequently Lantus dose was decreased back to 8 units twice a day. Patient's blood sugars improved. Patient will maintain on Lantus 8 units twice daily as well as sliding scale insulin with titrations as needed. Patient be discharged to inpatient rehabilitation. Patient is to follow-up with PCP post discharge from inpatient rehabilitation.     Procedures:  MRI MRA head 03/14/2017, 03/17/2017  CT head 03/13/2017  CXR 03/15/2107  CT abdomen and pelvis 03/15/2017  EEG 03/14/2017  Repeat MRI head   LP per neurology and on the fluoroscopy attempted however aborted due to increased agitation   Consultations:  Neurology: Dr.Stewart 03/13/2017  Nephrology 03/14/2017  Infectious diseases: Dr. Johnnye Sima 03/15/2017   Discharge Exam: Vitals:   03/20/17 2228 03/21/17 0958  BP: 135/74 (!) 154/63  Pulse: 78 90  Resp: 19 20  Temp: 98.2 F (36.8 C) 98.4 F (36.9 C)    General: NAD Cardiovascular: RRR Respiratory: CTAB  Discharge Instructions   Discharge Instructions    Ambulatory referral to Neurology    Complete by:  As directed    An appointment is requested in  approximately: 2-4 weeks   Diet - low sodium heart healthy    Complete by:  As directed    Dysphagia 2 diet   Increase activity slowly    Complete by:  As directed      Current Discharge Medication List    START taking these medications   Details  isosorbide mononitrate (IMDUR) 30 MG 24 hr tablet Take 0.5 tablets (15 mg total) by mouth daily. Qty: 30 tablet, Refills: 1    levETIRAcetam (KEPPRA) 250 MG tablet Take 1 tablet (250 mg total) by mouth 2 (two) times daily. Qty: 60 tablet, Refills: 0    NIFEdipine (PROCARDIA XL/ADALAT-CC) 90 MG 24 hr tablet Take 1 tablet (90 mg total) by mouth daily. Qty: 30 tablet, Refills: 2    pantoprazole (PROTONIX) 40 MG tablet Take 1 tablet (40 mg total) by mouth daily at 6 (six) AM. Qty: 30 tablet, Refills: 1      CONTINUE these medications which have NOT CHANGED   Details  atorvastatin (LIPITOR) 20 MG tablet Take 1 tablet (20 mg total) by mouth daily. Qty: 90 tablet, Refills: 3   Associated Diagnoses: Hyperlipidemia associated with type 2 diabetes mellitus (HCC)    calcitRIOL (ROCALTROL) 0.5 MCG capsule Take 1 capsule (0.5 mcg total) by mouth daily. Qty: 30 capsule, Refills: 0    calcium carbonate (TUMS - DOSED IN MG ELEMENTAL  CALCIUM) 500 MG chewable tablet Chew 2 tablets (400 mg of elemental calcium total) by mouth 3 (three) times daily with meals. Qty: 30 tablet, Refills: 0    gentamicin cream (GARAMYCIN) 0.1 % Apply 1 application topically daily. To PD site Qty: 15 g, Refills: 0    hydrALAZINE (APRESOLINE) 100 MG tablet Take 1 tablet (100 mg total) by mouth 3 (three) times daily. Qty: 90 tablet, Refills: 0    insulin NPH Human (HUMULIN N,NOVOLIN N) 100 UNIT/ML injection Inject 0.15 mLs (15 Units total) into the skin 2 (two) times daily at 8 am and 10 pm. Qty: 10 mL, Refills: 0    metoprolol (LOPRESSOR) 100 MG tablet Take 1 tablet (100 mg total) by mouth 2 (two) times daily. Qty: 60 tablet, Refills: 0    multivitamin (RENA-VIT)  TABS tablet Take 1 tablet by mouth daily.    senna-docusate (SENOKOT-S) 8.6-50 MG tablet Take 2 tablets by mouth at bedtime. Qty: 60 tablet, Refills: 0    sevelamer carbonate (RENVELA) 800 MG tablet Take 800 mg by mouth 3 (three) times daily with meals.    temazepam (RESTORIL) 15 MG capsule Take 15 mg by mouth at bedtime.      STOP taking these medications     amLODipine (NORVASC) 10 MG tablet        Allergies  Allergen Reactions  . Penicillins Other (See Comments)    Has patient had a PCN reaction causing immediate rash, facial/tongue/throat swelling, SOB or lightheadedness with hypotension: Unk Has patient had a PCN reaction causing severe rash involving mucus membranes or skin necrosis: Unk Has patient had a PCN reaction that required hospitalization: Unk Has patient had a PCN reaction occurring within the last 10 years: Unk If all of the above answers are "NO", then may proceed with Cephalosporin use.    Follow-up Information    Jared Dingwall, NP-C. Schedule an appointment as soon as possible for a visit in 2 week(s).   Specialty:  Family Medicine Contact information: [place] [address] [phone]        DUNHAM,CYNTHIA B, MD. Schedule an appointment as soon as possible for a visit in 1 week(s).   Specialty:  Nephrology Why:  f/u in 1-2 weeks. Contact information: [place] [address] [phone]        neurology Follow up.   Why:  Neurology office will call with appointment time.           The results of significant diagnostics from this hospitalization (including imaging, microbiology, ancillary and laboratory) are listed below for reference.    Significant Diagnostic Studies: Ct Head Wo Contrast  Result Date: 03/13/2017 CLINICAL DATA:  Altered mental status with right-sided facial droop and confusion EXAM: CT HEAD WITHOUT CONTRAST TECHNIQUE: Contiguous axial images were obtained from the base of the skull  through the vertex without intravenous contrast. COMPARISON:  MRI 11/27/2016, CT brain 11/24/2016 FINDINGS: Brain: No acute territorial infarction, hemorrhage, or focal mass lesion is visualized. There is mild to moderate atrophy. The ventricles are nonenlarged. Vascular: No hyperdense vessels. Scattered calcifications at the carotid siphons. Mild vertebral artery calcification Skull: No fracture. Stable ground-glass slightly expansile lesion in the anterior medial wall of the left maxillary sinus. Sinuses/Orbits: Mild mucosal thickening in the ethmoid sinuses and maxillary sinuses. No acute orbital abnormality. Other: None IMPRESSION: No definite CT evidence for acute intracranial abnormality. Electronically Signed   By: Donavan Foil M.D.   On: 03/13/2017 20:51   Mr [facility]  Wo Contrast  Result Date: 03/14/2017 CLINICAL DATA:  61 year old male with end-stage renal disease on peritoneal dialysis. Recent initiation of treatment for prostate cancer. Mental status changes, inappropriate speech, left hand tremor. EXAM: MRI HEAD WITHOUT CONTRAST MRA HEAD WITHOUT CONTRAST TECHNIQUE: Multiplanar, multiecho pulse sequences of the brain and surrounding structures were obtained without intravenous contrast. Angiographic images of the head were obtained using MRA technique without contrast. COMPARISON:  Head CT without contrast 03/13/2017. Brain MRI 11/27/2016 FINDINGS: MRI HEAD FINDINGS Study is mildly degraded by motion artifact despite repeated imaging attempts. Brain: No restricted diffusion to suggest acute infarction. No midline shift, mass effect, evidence of mass lesion, ventriculomegaly, extra-axial collection or acute intracranial hemorrhage. Cervicomedullary junction and pituitary are within normal limits. [place] Cables and white matter signal appears stable and normal for age throughout the brain. No encephalomalacia or chronic cerebral blood products identified. Vascular: Major intracranial vascular flow voids are  stable with mild intracranial artery tortuosity. Skull and upper cervical spine: Visible bone marrow signal is stable and normal. Negative visible cervical spine. Sinuses/Orbits: Stable and negative. Other: Stable posterior left scalp lipoma (series 5, image 4). Otherwise negative scalp soft tissues. Visible internal auditory structures appear normal. Mild bilateral mastoid effusions seen in January have largely resolved. MRA HEAD FINDINGS Antegrade flow in the posterior circulation with codominant distal vertebral arteries. No distal vertebral stenosis. No basilar artery stenosis. AICA, SCA and PCA origins appear normal. Bilateral PCA branches are within normal limits. Posterior communicating arteries are diminutive or absent. Antegrade flow in both ICA siphons. Tortuous cavernous segments. No ICA siphon stenosis. Ophthalmic artery origins appear to remain patent. Patent carotid termini. MCA and ACA origins are normal. Tortuous A1 segments, more so the left. Diminutive anterior communicating artery. Visible ACA branches are normal aside from mild tortuosity. Normal MCA M1 segments. Bilateral MCA bifurcations and visible bilateral MCA branches are within normal limits. IMPRESSION: 1. No acute intracranial abnormality. Stable since January and negative noncontrast MRI appearance of the brain. 2.  Negative intracranial MRA. Electronically Signed   By: Genevie Ann M.D.   On: 03/14/2017 11:27   Mr Brain Wo Contrast  Result Date: 03/17/2017 CLINICAL DATA:  Seizures and encephalopathy EXAM: MRI HEAD WITHOUT CONTRAST TECHNIQUE: Multiplanar, multiecho pulse sequences of the brain and surrounding structures were obtained without intravenous contrast. COMPARISON:  Brain MRI 03/14/2017 FINDINGS: Despite efforts by the technologist and patient, motion artifact is present on today's examination and could not be eliminated. The findings of this study are interpreted in the context of that limitation. Brain: No focal diffusion  restriction to indicate acute infarct. No intraparenchymal hemorrhage. The brain parenchymal signal is normal. No mass lesion or midline shift. No hydrocephalus or extra-axial fluid collection. The midline structures are normal. No age advanced or lobar predominant atrophy. Vascular: Major intracranial arterial and venous sinus flow voids are preserved. No evidence of chronic microhemorrhage or amyloid angiopathy. Skull and upper cervical spine: The visualized skull base, calvarium, upper cervical spine and extracranial soft tissues are normal. Sinuses/Orbits: No fluid levels or advanced mucosal thickening. No mastoid effusion. Normal orbits. IMPRESSION: Unchanged MRI of the brain without acute abnormality. Electronically Signed   By: Ulyses Jarred M.D.   On: 03/17/2017 16:32   Mr Brain Wo Contrast  Result Date: 03/14/2017 CLINICAL DATA:  61 year old male with end-stage renal disease on peritoneal dialysis. Recent initiation of treatment for prostate cancer. Mental status changes, inappropriate speech, left hand tremor. EXAM: MRI HEAD WITHOUT CONTRAST MRA HEAD WITHOUT CONTRAST TECHNIQUE: Multiplanar, multiecho  pulse sequences of the brain and surrounding structures were obtained without intravenous contrast. Angiographic images of the head were obtained using MRA technique without contrast. COMPARISON:  Head CT without contrast 03/13/2017. Brain MRI 11/27/2016 FINDINGS: MRI HEAD FINDINGS Study is mildly degraded by motion artifact despite repeated imaging attempts. Brain: No restricted diffusion to suggest acute infarction. No midline shift, mass effect, evidence of mass lesion, ventriculomegaly, extra-axial collection or acute intracranial hemorrhage. Cervicomedullary junction and pituitary are within normal limits. [place] Cables and white matter signal appears stable and normal for age throughout the brain. No encephalomalacia or chronic cerebral blood products identified. Vascular: Major intracranial vascular flow  voids are stable with mild intracranial artery tortuosity. Skull and upper cervical spine: Visible bone marrow signal is stable and normal. Negative visible cervical spine. Sinuses/Orbits: Stable and negative. Other: Stable posterior left scalp lipoma (series 5, image 4). Otherwise negative scalp soft tissues. Visible internal auditory structures appear normal. Mild bilateral mastoid effusions seen in January have largely resolved. MRA HEAD FINDINGS Antegrade flow in the posterior circulation with codominant distal vertebral arteries. No distal vertebral stenosis. No basilar artery stenosis. AICA, SCA and PCA origins appear normal. Bilateral PCA branches are within normal limits. Posterior communicating arteries are diminutive or absent. Antegrade flow in both ICA siphons. Tortuous cavernous segments. No ICA siphon stenosis. Ophthalmic artery origins appear to remain patent. Patent carotid termini. MCA and ACA origins are normal. Tortuous A1 segments, more so the left. Diminutive anterior communicating artery. Visible ACA branches are normal aside from mild tortuosity. Normal MCA M1 segments. Bilateral MCA bifurcations and visible bilateral MCA branches are within normal limits. IMPRESSION: 1. No acute intracranial abnormality. Stable since January and negative noncontrast MRI appearance of the brain. 2.  Negative intracranial MRA. Electronically Signed   By: Genevie Ann M.D.   On: 03/14/2017 11:27   Ct Abdomen Pelvis W Contrast  Result Date: 03/15/2017 CLINICAL DATA:  60 year old male, dialysis patient with fever of unknown origin. Initial encounter. EXAM: CT ABDOMEN AND PELVIS WITH CONTRAST TECHNIQUE: Multidetector CT imaging of the abdomen and pelvis was performed using the standard protocol following bolus administration of intravenous contrast. CONTRAST:  100 mL ISOVUE-300 IOPAMIDOL (ISOVUE-300) INJECTION 61% COMPARISON:  CT Abdomen and Pelvis 12/08/2016 FINDINGS: Lower chest: Lower lung volumes. New small  layering left pleural fusion. Crowding of markings/mild atelectasis at both lung bases. No pericardial effusion. Hepatobiliary: Negative. Pancreas: Negative. Spleen: Negative. Adrenals/Urinary Tract: Adrenal glands are within normal limits. Both kidneys appear stable, with mildly decreased renal enhancement compared to that expected, but this is unchanged. Right renal parapelvic cyst or mild chronic right hydronephrosis is unchanged. Thick walled urinary bladder re- demonstrated. No gas within the bladder. Stomach/Bowel: Moderate volume of ascites throughout the abdomen similar to the study in January. There is a left abdominal approach peritoneal dialysis catheter. No definite pneumoperitoneum. Decompressed stomach and duodenum. Proximal jejunum is fluid-filled, but there are no significantly dilated small bowel loops. There is a catheter looped in the rectum which has layering fluid. The sigmoid colon is redundant and partially filled with fluid Ing gas. There is fluid in the left colon and at the splenic flexure. Redundant splenic flexure. Redundant transverse colon. Gas at the hepatic flexure. Fluid in the right colon. No abnormally dilated large bowel Vascular/Lymphatic: Major arterial structures in the abdomen and pelvis are patent with minimal atherosclerosis. Portal venous system is patent. Reproductive: New left scrotal/penile hydrocele with trace gas (series 3, image 99). The remainder of the visible scrotum appears within normal  limits. Other: Little to no pelvic free fluid. Musculoskeletal: Unchanged appearance of multilevel superior endplate compression fractures in the lower thoracic and lumbar spine including T9, T10, L1, L2, L3, and L4. No acute osseous abnormality identified. IMPRESSION: 1. Peritoneal dialysis patient with stable moderate volume of ascites compared to January. There is a new left scrotal/penile hydrocele which also contains trace gas, but this might be related to the peritoneal  dialysis. Recommend correlation with scrotal physical exam and scrotal ultrasound as necessary. 2. There is fluid throughout the large bowel which may indicate diarrhea. There is a rectal catheter in place. No focal bowel inflammation or obstruction identified. 3. No other acute or inflammatory process identified in the abdomen or pelvis. 4. New small layering left pleural effusion. 5. Stable multilevel lower thoracic and lumbar compression fractures. Electronically Signed   By: Genevie Ann M.D.   On: 03/15/2017 15:34   Dg Chest Port 1 View  Result Date: 03/14/2017 CLINICAL DATA:  Fever, history of diabetes, chronic renal insufficiency, seizure disorder, former smoker. EXAM: PORTABLE CHEST 1 VIEW COMPARISON:  Portable chest x-ray of March 13, 2017 FINDINGS: The lungs are mildly hypoinflated but clear. The cardiac silhouette is mildly enlarged. The pulmonary vascularity is normal. The mediastinum is normal in width. There is no pleural effusion. The bony thorax is unremarkable. IMPRESSION: Stable appearance of the chest. Mild cardiomegaly in which is accentuated by hypoinflation. No pneumonia or pulmonary edema. Electronically Signed   By: David  Martinique M.D.   On: 03/14/2017 08:49   Dg Chest Port 1 View  Result Date: 03/13/2017 CLINICAL DATA:  Patient was found wandering outside. Last seen normal at 1400 hours today. Right-sided facial droop. Confusion and weakness. History of diabetes. EXAM: PORTABLE CHEST 1 VIEW COMPARISON:  11/26/2016 FINDINGS: Shallow inspiration. Normal heart size and pulmonary vascularity. No focal airspace disease or consolidation in the lungs. No blunting of costophrenic angles. No pneumothorax. Mediastinal contours appear intact. IMPRESSION: No active disease. Electronically Signed   By: Lucienne Capers M.D.   On: 03/13/2017 22:11    Microbiology: Recent Results (from the past 240 hour(s))  Gram stain     Status: None   Collection Time: 03/13/17  8:15 PM  Result Value Ref Range  Status   Specimen Description PERITONEAL  Final   Special Requests NONE  Final   Gram Stain   Final    DIRECT SMEAR NO WBC SEEN NO ORGANISMS SEEN RESULT CALLED TO, READ BACK BY AND VERIFIED WITH: M.PETERS,RN 2200 03/13/17 M.CAMPBELL    Report Status 03/13/2017 FINAL  Final  Culture, body fluid-bottle     Status: None   Collection Time: 03/13/17  8:15 PM  Result Value Ref Range Status   Specimen Description FLUID PERITONEAL  Final   Special Requests   Final    BOTTLES DRAWN AEROBIC AND ANAEROBIC Blood Culture adequate volume   Culture NO GROWTH 5 DAYS  Final   Report Status 03/18/2017 FINAL  Final  MRSA PCR Screening     Status: None   Collection Time: 03/14/17  5:18 AM  Result Value Ref Range Status   MRSA by PCR NEGATIVE NEGATIVE Final    Comment:        The GeneXpert MRSA Assay (FDA approved for NASAL specimens only), is one component of a comprehensive MRSA colonization surveillance program. It is not intended to diagnose MRSA infection nor to guide or monitor treatment for MRSA infections.   Culture, blood (Routine X 2) w Reflex to ID Panel  Status: None   Collection Time: 03/14/17  7:51 AM  Result Value Ref Range Status   Specimen Description BLOOD RIGHT HAND  Final   Special Requests   Final    BOTTLES DRAWN AEROBIC ONLY Blood Culture adequate volume   Culture NO GROWTH 5 DAYS  Final   Report Status 03/19/2017 FINAL  Final  Culture, blood (Routine X 2) w Reflex to ID Panel     Status: None   Collection Time: 03/14/17  8:14 AM  Result Value Ref Range Status   Specimen Description BLOOD RIGHT ASSIST CONTROL  Final   Special Requests IN PEDIATRIC BOTTLE Blood Culture adequate volume  Final   Culture NO GROWTH 5 DAYS  Final   Report Status 03/19/2017 FINAL  Final  Urine culture     Status: None   Collection Time: 03/14/17  9:20 AM  Result Value Ref Range Status   Specimen Description URINE, RANDOM  Final   Special Requests NONE  Final   Culture NO GROWTH   Final   Report Status 03/15/2017 FINAL  Final  Respiratory Panel by PCR     Status: None   Collection Time: 03/15/17 10:19 AM  Result Value Ref Range Status   Adenovirus NOT DETECTED NOT DETECTED Final   Coronavirus 229E NOT DETECTED NOT DETECTED Final   Coronavirus HKU1 NOT DETECTED NOT DETECTED Final   Coronavirus NL63 NOT DETECTED NOT DETECTED Final   Coronavirus OC43 NOT DETECTED NOT DETECTED Final   Metapneumovirus NOT DETECTED NOT DETECTED Final   Rhinovirus / Enterovirus NOT DETECTED NOT DETECTED Final   Influenza A NOT DETECTED NOT DETECTED Final   Influenza B NOT DETECTED NOT DETECTED Final   Parainfluenza Virus 1 NOT DETECTED NOT DETECTED Final   Parainfluenza Virus 2 NOT DETECTED NOT DETECTED Final   Parainfluenza Virus 3 NOT DETECTED NOT DETECTED Final   Parainfluenza Virus 4 NOT DETECTED NOT DETECTED Final   Respiratory Syncytial Virus NOT DETECTED NOT DETECTED Final   Bordetella pertussis NOT DETECTED NOT DETECTED Final   Chlamydophila pneumoniae NOT DETECTED NOT DETECTED Final   Mycoplasma pneumoniae NOT DETECTED NOT DETECTED Final  Culture, blood (Routine X 2) w Reflex to ID Panel     Status: None (Preliminary result)   Collection Time: 03/19/17 11:00 AM  Result Value Ref Range Status   Specimen Description BLOOD RIGHT HAND  Final   Special Requests   Final    BOTTLES DRAWN AEROBIC ONLY Blood Culture adequate volume   Culture NO GROWTH < 24 HOURS  Final   Report Status PENDING  Incomplete     Labs: Basic Metabolic Panel:  Recent Labs Lab 03/16/17 0232 03/16/17 1624 03/17/17 0824 03/18/17 0229 03/18/17 0230 03/20/17 0341 03/21/17 0740  NA 133* 138 138  --  136 132* 129*  K 4.6 4.2 4.0  --  3.8 4.1 3.8  CL 96* 100* 99*  --  98* 93* 92*  CO2 19* 22 23  --  24 26 25   GLUCOSE 322* 105* 238*  --  77 239* 321*  BUN 75* 68* 55*  --  51* 46* 42*  CREATININE 12.21* 12.14* 10.85*  --  10.39* 9.29* 9.18*  CALCIUM 8.3* 8.2* 8.3*  --  8.0* 8.0* 7.8*  MG  --  1.5*   --  2.3  --   --   --   PHOS 7.6*  --  7.1*  --  7.7* 8.0* 6.6*   Liver Function Tests:  Recent Labs Lab 03/16/17 0232  03/17/17 0824 03/18/17 0230 03/20/17 0341 03/21/17 0740  ALBUMIN 2.3* 2.1* 2.0* 1.8* 1.7*   No results for input(s): LIPASE, AMYLASE in the last 168 hours. No results for input(s): AMMONIA in the last 168 hours. CBC:  Recent Labs Lab 03/16/17 0232 03/16/17 1624 03/17/17 0824 03/18/17 0229 03/19/17 0250 03/20/17 0341 03/21/17 0740  WBC 8.8 6.7 6.4 7.1 7.4 7.5 6.2  NEUTROABS 6.7 5.1 4.5  --  5.7 6.0  --   HGB 9.3* 9.1* 9.3* 8.3* 8.9* 8.7* 8.4*  HCT 28.1* 26.5* 27.8* 25.4* 26.8* 26.3* 24.6*  MCV 80.7 80.8 82.2 81.4 81.0 80.7 81.2  PLT 199 172 165 197 201 252 284   Cardiac Enzymes: No results for input(s): CKTOTAL, CKMB, CKMBINDEX, TROPONINI in the last 168 hours. BNP: BNP (last 3 results) No results for input(s): BNP in the last 8760 hours.  ProBNP (last 3 results) No results for input(s): PROBNP in the last 8760 hours.  CBG:  Recent Labs Lab 03/20/17 2006 03/20/17 2217 03/21/17 0029 03/21/17 0423 03/21/17 0721  GLUCAP 135* 195* 231* 271* 310*       Signed:  THOMPSON,DANIEL MD.  [facility] 03/21/2017, 10:10 AM

## 2017-03-21 NOTE — Progress Notes (Signed)
Physical Therapy Treatment Patient Details Name: Jared [name] MRN: [id number] DOB: 01-26-1956 Today's Date: 03/21/2017    History of Present Illness Pt is a 61 y.o. male admitted to ED on 03/13/17 with AMS, hyperglycemia (>1000), and fevers. CT and MRI negative for acute infarct; additional workup is pending. EEG 4/18 abnormal with diffuse slowing of waking background. Most recent seizure-like activity x2 on 4/20. Pertinent PMH includes ESRD (on home dialysis), DM, seizures, HTN, chronic cough, prostate CA.     PT Comments    Continuing work on functional mobility and activity tolerance;  Able to increase distance; noting decr trunk stability and heavy dependence on RW; Overall progressing well; Anticipate continuing good progress at post-acute rehabilitation.    Follow Up Recommendations  CIR;Supervision/Assistance - 24 hour     Equipment Recommendations       Recommendations for Other Services       Precautions / Restrictions Precautions Precautions: Fall    Mobility  Bed Mobility Overal bed mobility: Needs Assistance Bed Mobility: Supine to Sit     Supine to sit: Min guard     General bed mobility comments: No physical assistance but min guard for safety.  Transfers Overall transfer level: Needs assistance Equipment used: Rolling walker (2 wheeled) Transfers: Sit to/from Stand Sit to Stand: Min assist         General transfer comment: Min assist for trunk support.  Ambulation/Gait Ambulation/Gait assistance: Min assist;+2 safety/equipment Ambulation Distance (Feet): 50 Feet Assistive device: Rolling walker (2 wheeled) Gait Pattern/deviations: Step-through pattern;Decreased weight shift to right;Staggering right;Decreased stride length     General Gait Details: Grossly unsteady with amb; short step length; cues to self-monitor for activity tolerance   Stairs            Wheelchair Mobility    Modified Rankin (Stroke Patients Only) Modified Rankin  (Stroke Patients Only) Pre-Morbid Rankin Score: No symptoms Modified Rankin: Moderately severe disability     Balance Overall balance assessment: Needs assistance   Sitting balance-Leahy Scale: Fair       Standing balance-Leahy Scale: Fair                              Cognition Arousal/Alertness: Awake/alert Behavior During Therapy: WFL for tasks assessed/performed Overall Cognitive Status: Impaired/Different from baseline Area of Impairment: Safety/judgement                         Safety/Judgement: Decreased awareness of safety;Decreased awareness of deficits            Exercises      General Comments        Pertinent Vitals/Pain Pain Assessment: Faces Faces Pain Scale: Hurts little more Pain Descriptors / Indicators: Grimacing Pain Intervention(s): Monitored during session    Home Living                      Prior Function            PT Goals (current goals can now be found in the care plan section) Acute Rehab PT Goals Patient Stated Goal: Return home PT Goal Formulation: With patient Time For Goal Achievement: 04/02/17 Potential to Achieve Goals: Good Progress towards PT goals: Progressing toward goals    Frequency    Min 3X/week      PT Plan Current plan remains appropriate    Co-evaluation  End of Session Equipment Utilized During Treatment: Gait belt Activity Tolerance: Patient tolerated treatment well Patient left: in chair;with call bell/phone within reach;with chair alarm set Nurse Communication: Mobility status PT Visit Diagnosis: Other abnormalities of gait and mobility (R26.89)     Time: 8325-4982 PT Time Calculation (min) (ACUTE ONLY): 18 min  Charges:  $Gait Training: 8-22 mins                    G Codes:       Roney Marion, PT  Acute Rehabilitation Services Pager [phone] Office [phone]    [name] 03/21/2017, 4:50 PM

## 2017-03-21 NOTE — Progress Notes (Deleted)
[name], MD Physician Signed Physical Medicine and Rehabilitation  Consult Note Date of Service: 03/20/2017  2:44 PM  Related encounter: ED to Hosp-Admission (Current) from 03/13/2017 in [place] copied text    Hover for attribution information                                                                                                            untitled image              Physical Medicine and Rehabilitation Consult  Reason for Consult: Altered mental status with decreased functional mobility  Referring Physician: [facility]        HPI: Jared [name] is a 61 y.o. right hand male with history of end-stage renal disease with peritoneal dialysis, hypertension, diabetes mellitus, prostate cancer and prior seizure disorder. Per chart review patient lives with sister and brother-in-law. Independent prior to admission he does his own peritoneal dialysis at home and drives. Family plans assistance as needed but limited physically. Presented 03/13/2017 with altered mental status and slurred speech. Denied any recent trauma. Lactic acid 1.35, sodium 122, potassium 5.6, creatinine 9.88. MRI of the brain negative. MRA unremarkable. CT of the abdomen showed moderate volume of ascites stable as compared to January 2018. EEG negative for seizure. Low-grade fever infectious disease consult did blood cultures negative etiology of syndrome of unclear in regards to fever. Neurology follow-up suspect acute encephalopathy. Follow-up renal services in regards to dialysis. Occupational therapy 03/20/2017 no tissue in regards to generalized weakness decreased safety risk of falls. Recommendations made for physical medicine rehabilitation consult.        Review of  Systems   Constitutional: Positive for chills and fever.   HENT: Negative for hearing loss.    Eyes: Negative for blurred vision and double vision.   Respiratory: Negative for cough and shortness of breath.    Cardiovascular: Positive for leg swelling. Negative for chest pain and palpitations.   Gastrointestinal: Positive for constipation. Negative for nausea and vomiting.   Genitourinary: Negative for hematuria.   Musculoskeletal: Positive for joint pain and myalgias.   Skin: Negative for rash.   Neurological: Positive for seizures and weakness.   Psychiatric/Behavioral: The patient has insomnia.    All other systems reviewed and are negative.          Past Medical History:    Diagnosis   Date    .   Anemia        .   Chronic kidney disease            on dialysis M-W-F    .   Diabetes mellitus without complication ([place])            onset as adult    .   Hypertension        .  Seizures ([place])        .   Stroke ([place])        .   Thyroid disease            hyperparathyroidism             Past Surgical History:    Procedure   Laterality   Date    .   AV FISTULA PLACEMENT   Left   06/04/2014        Procedure: ARTERIOVENOUS (AV) FISTULA CREATION-  BRACHIOCEPHALIC WITH LIGATION OF COMPETEING BRANCH;  Surgeon: Mal Misty, MD;  Location: [facility];  Service: Vascular;  Laterality: Left;    .   INSERTION OF DIALYSIS CATHETER       04/2014    .   IR GENERIC HISTORICAL   Left   11/30/2016        IR THROMBECTOMY AV FISTULA W/THROMBOLYSIS/PTA/STENT INC/SHUNT/IMG LT 11/30/2016 Markus Daft, MD [facility]    .   IR GENERIC HISTORICAL       11/30/2016        IR US GUIDE VASC ACCESS LEFT 11/30/2016 Markus Daft, MD [facility]             Family History    Problem   Relation   Age of Onset    .   Cancer - Lung   Mother        .   Cancer - Other   Father            Social History:  reports that he quit smoking about 32 years ago. He has never used smokeless tobacco. He reports that he drinks about 0.6 oz of alcohol per week . He reports that he does not use drugs.  Allergies:         Allergies    Allergen   Reactions    .   Penicillins   Other (See Comments)            Has patient had a PCN reaction causing immediate rash, facial/tongue/throat swelling, SOB or lightheadedness with hypotension: Unk  Has patient had a PCN reaction causing severe rash involving mucus membranes or skin necrosis: Unk  Has patient had a PCN reaction that required hospitalization: Unk  Has patient had a PCN reaction occurring within the last 10 years: Unk  If all of the above answers are "NO", then may proceed with Cephalosporin use.                 Medications Prior to Admission    Medication   Sig   Dispense   Refill    .   amLODipine (NORVASC) 10 MG tablet   Take 1 tablet (10 mg total) by mouth daily.   30 tablet   0    .   atorvastatin (LIPITOR) 20 MG tablet   Take 1 tablet (20 mg total) by mouth daily.   90 tablet   3    .   calcitRIOL (ROCALTROL) 0.5 MCG capsule   Take 1 capsule (0.5 mcg total) by mouth daily.   30 capsule   0    .   calcium carbonate (TUMS - DOSED IN MG ELEMENTAL CALCIUM) 500 MG chewable tablet   Chew 2 tablets (400 mg of elemental calcium total) by mouth 3 (three) times daily with meals.   30 tablet   0    .   gentamicin cream (GARAMYCIN) 0.1 %   Apply 1 application  topically daily. To PD site   15 g   0    .   hydrALAZINE (APRESOLINE) 100 MG tablet   Take 1 tablet (100 mg total) by mouth 3 (three) times daily.   90 tablet   0    .   insulin NPH Human (HUMULIN N,NOVOLIN N) 100 UNIT/ML injection   Inject 0.15 mLs (15 Units total) into the skin 2 (two) times daily at 8 am and 10 pm.   10 mL   0    .   metoprolol (LOPRESSOR) 100 MG  tablet   Take 1 tablet (100 mg total) by mouth 2 (two) times daily.   60 tablet   0    .   multivitamin (RENA-VIT) TABS tablet   Take 1 tablet by mouth daily.            Marland Kitchen   senna-docusate (SENOKOT-S) 8.6-50 MG tablet   Take 2 tablets by mouth at bedtime.   60 tablet   0    .   sevelamer carbonate (RENVELA) 800 MG tablet   Take 800 mg by mouth 3 (three) times daily with meals.            .   temazepam (RESTORIL) 15 MG capsule   Take 15 mg by mouth at bedtime.                  Home:  Home Living  Family/patient expects to be discharged to:: Private residence  Living Arrangements: Other relatives (sisters )  Available Help at Discharge: Family, Available 24 hours/day  Type of Home: House  Home Access: Level entry  Home Layout: Two level, Bed/bath upstairs  Alternate Level Stairs-Number of Steps: 1 flight  Alternate Level Stairs-Rails: Left, Right  Bathroom Shower/Tub: Tourist information centre manager: Standard  Bathroom Accessibility: Yes  Home Equipment: None   Lives With: Family   Functional History:  Prior Function  Level of Independence: Independent  Comments: Works periodically transporting cars; 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   Functional Status:   Mobility:  Bed Mobility  Overal bed mobility: Needs Assistance  Bed Mobility: Supine to Sit, Sit to Supine  Supine to sit: Min guard  Sit to supine: Min guard  General bed mobility comments: No physical assistance but min guard for safety.  Transfers  Overall transfer level: Needs assistance  Equipment used: None  Transfers: Sit to/from Stand  Sit to Stand: Min assist  General transfer comment: Min assist for trunk support.  Ambulation/Gait  Ambulation/Gait assistance: Min assist, +2 safety/equipment  Ambulation Distance (Feet): 15 Feet (15)  Assistive device: None, Rolling walker (2 wheeled)  Gait Pattern/deviations: Step-through  pattern, Decreased weight shift to right, Staggering right, Decreased stride length  General Gait Details: Amb to bathroom with no AD and minA for balance; increased lateral sway with RLE instability, requiring minA for balance; scissoring with RLE, with potential proprioceptive issues (??). Amb from bathroom to bed with RW and min guard for balance with RLE instability; required verbal cues for hand placement with RW.        ADL:  ADL  Overall ADL's : Needs assistance/impaired  Eating/Feeding: NPO  Grooming: Minimal assistance, Sitting  Upper Body Bathing: Total assistance, Bed level  Lower Body Bathing: Total assistance, Bed level  Upper Body Dressing : Total assistance, Bed level  Lower Body Dressing: Total assistance, Bed level  Toilet Transfer: Minimal assistance, Moderate assistance, RW, Ambulation  Toilet Transfer Details (indicate cue type and reason):  Pt unsteady with R knee buckling. Required cues to utilize RW safely.  Toileting- Clothing Manipulation and Hygiene: Total assistance, Bed level  Functional mobility during ADLs: Minimal assistance, Moderate assistance, Rolling walker  General ADL Comments: Pt unsteady this session with mobility and impulsive. Pt's sister and son present during session and educated them concerning need for 24/7 hands on assistance to ensure safety post-acute D/C. Family in agreement that comprehensive rehabilitation in an inpatient setting is most beneficial for pt. However, pt is adamant that he return home.     Cognition:  Cognition  Overall Cognitive Status: Impaired/Different from baseline  Arousal/Alertness: Lethargic  Orientation Level: Oriented to person, Oriented to place, Oriented to time  Attention: Focused  Focused Attention: Impaired  Focused Attention Impairment: Verbal basic, Functional basic  Awareness: Impaired  Awareness Impairment: Emergent impairment  Problem Solving: Impaired  Problem Solving  Impairment: Functional basic  Behaviors: Restless  Safety/Judgment: Impaired  Cognition  Arousal/Alertness: Awake/alert  Behavior During Therapy: WFL for tasks assessed/performed  Overall Cognitive Status: Impaired/Different from baseline  Area of Impairment: Attention, Memory, Following commands, Safety/judgement, Awareness, Problem solving  Orientation Level: Disoriented to, Time  Current Attention Level: Selective  Memory: Decreased short-term memory  Following Commands: Follows one step commands consistently  Safety/Judgement: Decreased awareness of safety, Decreased awareness of deficits  Awareness: Intellectual  Problem Solving: Slow processing, Decreased initiation, Requires verbal cues, Requires tactile cues  General Comments: Pt with improved interaction today but highly impulsive and attempting to sit unsafely. Poor problem solving skills despite multimodal cues. Pt becoming agitated with therapist when given multi-step commands.     Blood pressure (!) 151/71, pulse 84, temperature 98.6 F (37 C), temperature source Oral, resp. rate 18, height 5\' 10"  (1.778 m), weight 76.6 kg (168 lb 12.8 oz), SpO2 98 %.  Physical Exam   HENT:   Head: Normocephalic.   Eyes: EOM are normal.   Neck: Normal range of motion. Neck supple. No thyromegaly present.   Cardiovascular: Normal rate and regular rhythm.    Respiratory: Effort normal and breath sounds normal. No respiratory distress.   GI: Soft. Bowel sounds are normal. He exhibits no distension.  Neurological:  Patient is lethargic but arousable. He would fall back asleep during exam. He was able to provide his name and date of birth. Followed simple commands. His sister was at bedside.   Skin: Skin is warm and dry.         Lab Results Last 24 Hours  Imaging Results (Last 48 hours)          Assessment/Plan:  Diagnosis: seizure due to hyperglycemia, encephalopathy due to infectious etiology?  1.Does the need for close, 24 hr/day medical supervision in concert with the patient's rehab needs make it unreasonable for this patient to be served in a less intensive setting? Yes   2.Co-Morbidities requiring supervision/potential complications: ESRD on PD, prostate cancer   3.Due to bladder management, bowel management, safety, skin/wound care, disease management, medication administration, pain management and patient education, does the patient require 24 hr/day rehab nursing? Yes   4.Does the patient require  coordinated care of a physician, rehab nurse, PT (1-2 hrs/day, 5 days/week), OT (1-2 hrs/day, 5 days/week) and SLP (1-2 hrs/day, 5 days/week) to address physical and functional deficits in the context of the above medical diagnosis(es)? Yes Addressing deficits in the following areas: balance, endurance, locomotion, strength, transferring, bowel/bladder control, bathing, dressing, feeding, grooming, toileting, cognition, speech, swallowing and psychosocial support   5.Can the patient actively participate in an intensive therapy program of at least 3 hrs of therapy per day at least 5 days per week? Yes   6.The potential for patient to make measurable gains while on inpatient rehab is excellent   7.Anticipated functional outcomes upon discharge from inpatient rehab are modified independent and supervision  with PT, modified independent and supervision with OT, modified independent and supervision with SLP.   8.Estimated rehab length of stay to reach the above functional goals is: 8-12 days   9.Does the patient have adequate social supports and living environment to accommodate these discharge functional goals? Yes   10.Anticipated D/C setting: Home   11.Anticipated post D/C treatments: HH therapy and Outpatient therapy   12.Overall Rehab/Functional Prognosis: excellent      RECOMMENDATIONS:  This patient's condition is appropriate for continued rehabilitative care in the following setting: CIR  Patient has agreed to participate in recommended program. Yes  Note that insurance prior authorization may be required for reimbursement for recommended care.     Comment: Rehab Admissions Coordinator to follow up.     Thanks,     [name], MD, [facility]           Cathlyn Parsons., PA-C  03/20/2017         Revision History                                   Routing History          [name], MD Physician Signed Physical  Medicine and Rehabilitation  Consult Note Date of Service: 03/20/2017 2:44 PM  Related encounter: ED to Hosp-Admission (Current) from 03/13/2017 in [place] All Collapse All   [] Hide copied text [] Hover for attribution information      Physical Medicine and Rehabilitation Consult Reason for Consult: Altered mental status with decreased functional mobility Referring Physician: [facility]   HPI: Jared [name] is a 61 y.o. right hand male with history of end-stage renal disease with peritoneal dialysis, hypertension, diabetes mellitus, prostate cancer and prior seizure disorder. Per chart review patient lives with sister and brother-in-law. Independent prior to admission he does his own peritoneal dialysis at home and drives. Family plans assistance as needed but limited physically. Presented 03/13/2017 with altered mental status and slurred speech. Denied any recent trauma. Lactic acid 1.35, sodium 122, potassium 5.6, creatinine 9.88. MRI of  the brain negative. MRA unremarkable. CT of the abdomen showed moderate volume of ascites stable as compared to January 2018. EEG negative for seizure. Low-grade fever infectious disease consult did blood cultures negative etiology of syndrome of unclear in regards to fever. Neurology follow-up suspect acute encephalopathy. Follow-up renal services in regards to dialysis. Occupational therapy 03/20/2017 no tissue in regards to generalized weakness decreased safety risk of falls. Recommendations made for physical medicine rehabilitation consult.   Review of Systems  Constitutional: Positive for chills and fever.  HENT: Negative for hearing loss.   Eyes: Negative for blurred vision and double vision.  Respiratory: Negative for cough and shortness of breath.   Cardiovascular: Positive for leg swelling. Negative for chest pain and palpitations.  Gastrointestinal: Positive for constipation. Negative for nausea  and vomiting.  Genitourinary: Negative for hematuria.  Musculoskeletal: Positive for joint pain and myalgias.  Skin: Negative for rash.  Neurological: Positive for seizures and weakness.  Psychiatric/Behavioral: The patient has insomnia.   All other systems reviewed and are negative.      Past Medical History:  Diagnosis Date  . Anemia   . Chronic kidney disease    on dialysis M-W-F  . Diabetes mellitus without complication ([place])    onset as adult  . Hypertension   . Seizures ([place])   . Stroke ([place])   . Thyroid disease    hyperparathyroidism        Past Surgical History:  Procedure Laterality Date  . AV FISTULA PLACEMENT Left 06/04/2014   Procedure: ARTERIOVENOUS (AV) FISTULA CREATION-  BRACHIOCEPHALIC WITH LIGATION OF COMPETEING BRANCH;  Surgeon: Mal Misty, MD;  Location: [facility];  Service: Vascular;  Laterality: Left;  . INSERTION OF DIALYSIS CATHETER  04/2014  . IR GENERIC HISTORICAL Left 11/30/2016   IR THROMBECTOMY AV FISTULA W/THROMBOLYSIS/PTA/STENT INC/SHUNT/IMG LT 11/30/2016 Markus Daft, MD [facility]  . IR GENERIC HISTORICAL  11/30/2016   IR US GUIDE VASC ACCESS LEFT 11/30/2016 Markus Daft, MD [facility]        Family History  Problem Relation Age of Onset  . Cancer - Lung Mother   . Cancer - Other Father    Social History:  reports that he quit smoking about 32 years ago. He has never used smokeless tobacco. He reports that he drinks about 0.6 oz of alcohol per week . He reports that he does not use drugs. Allergies:       Allergies  Allergen Reactions  . Penicillins Other (See Comments)    Has patient had a PCN reaction causing immediate rash, facial/tongue/throat swelling, SOB or lightheadedness with hypotension: Unk Has patient had a PCN reaction causing severe rash involving mucus membranes or skin necrosis: Unk Has patient had a PCN reaction that required hospitalization: Unk Has patient had a PCN reaction occurring within the last 10  years: Unk If all of the above answers are "NO", then may proceed with Cephalosporin use.          Medications Prior to Admission  Medication Sig Dispense Refill  . amLODipine (NORVASC) 10 MG tablet Take 1 tablet (10 mg total) by mouth daily. 30 tablet 0  . atorvastatin (LIPITOR) 20 MG tablet Take 1 tablet (20 mg total) by mouth daily. 90 tablet 3  . calcitRIOL (ROCALTROL) 0.5 MCG capsule Take 1 capsule (0.5 mcg total) by mouth daily. 30 capsule 0  . calcium carbonate (TUMS - DOSED IN MG ELEMENTAL CALCIUM) 500 MG chewable tablet Chew 2 tablets (400 mg of elemental calcium total)  by mouth 3 (three) times daily with meals. 30 tablet 0  . gentamicin cream (GARAMYCIN) 0.1 % Apply 1 application topically daily. To PD site 15 g 0  . hydrALAZINE (APRESOLINE) 100 MG tablet Take 1 tablet (100 mg total) by mouth 3 (three) times daily. 90 tablet 0  . insulin NPH Human (HUMULIN N,NOVOLIN N) 100 UNIT/ML injection Inject 0.15 mLs (15 Units total) into the skin 2 (two) times daily at 8 am and 10 pm. 10 mL 0  . metoprolol (LOPRESSOR) 100 MG tablet Take 1 tablet (100 mg total) by mouth 2 (two) times daily. 60 tablet 0  . multivitamin (RENA-VIT) TABS tablet Take 1 tablet by mouth daily.    Marland Kitchen senna-docusate (SENOKOT-S) 8.6-50 MG tablet Take 2 tablets by mouth at bedtime. 60 tablet 0  . sevelamer carbonate (RENVELA) 800 MG tablet Take 800 mg by mouth 3 (three) times daily with meals.    . temazepam (RESTORIL) 15 MG capsule Take 15 mg by mouth at bedtime.      Home: Home Living Family/patient expects to be discharged to:: Private residence Living Arrangements: Other relatives (sisters ) Available Help at Discharge: Family, Available 24 hours/day Type of Home: House Home Access: Level entry Home Layout: Two level, Bed/bath upstairs Alternate Level Stairs-Number of Steps: 1 flight Alternate Level Stairs-Rails: Left, Right Bathroom Shower/Tub: Multimedia programmer: Standard Bathroom  Accessibility: Yes Home Equipment: None  Lives With: Family  Functional History: Prior Function Level of Independence: Independent Comments: Works periodically transporting cars; 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  Functional Status:  Mobility: Bed Mobility Overal bed mobility: Needs Assistance Bed Mobility: Supine to Sit, Sit to Supine Supine to sit: Min guard Sit to supine: Min guard General bed mobility comments: No physical assistance but min guard for safety. Transfers Overall transfer level: Needs assistance Equipment used: None Transfers: Sit to/from Stand Sit to Stand: Min assist General transfer comment: Min assist for trunk support. Ambulation/Gait Ambulation/Gait assistance: Min assist, +2 safety/equipment Ambulation Distance (Feet): 15 Feet (15) Assistive device: None, Rolling walker (2 wheeled) Gait Pattern/deviations: Step-through pattern, Decreased weight shift to right, Staggering right, Decreased stride length General Gait Details: Amb to bathroom with no AD and minA for balance; increased lateral sway with RLE instability, requiring minA for balance; scissoring with RLE, with potential proprioceptive issues (??). Amb from bathroom to bed with RW and min guard for balance with RLE instability; required verbal cues for hand placement with RW.   ADL: ADL Overall ADL's : Needs assistance/impaired Eating/Feeding: NPO Grooming: Minimal assistance, Sitting Upper Body Bathing: Total assistance, Bed level Lower Body Bathing: Total assistance, Bed level Upper Body Dressing : Total assistance, Bed level Lower Body Dressing: Total assistance, Bed level Toilet Transfer: Minimal assistance, Moderate assistance, RW, Ambulation Toilet Transfer Details (indicate cue type and reason): Pt unsteady with R knee buckling. Required cues to utilize RW safely. Toileting- Clothing Manipulation and Hygiene: Total assistance, Bed level Functional mobility during ADLs: Minimal  assistance, Moderate assistance, Rolling walker General ADL Comments: Pt unsteady this session with mobility and impulsive. Pt's sister and son present during session and educated them concerning need for 24/7 hands on assistance to ensure safety post-acute D/C. Family in agreement that comprehensive rehabilitation in an inpatient setting is most beneficial for pt. However, pt is adamant that he return home.  Cognition: Cognition Overall Cognitive Status: Impaired/Different from baseline Arousal/Alertness: Lethargic Orientation Level: Oriented to person, Oriented to place, Oriented to time Attention: Focused Focused Attention: Impaired Focused Attention Impairment: Verbal  basic, Functional basic Awareness: Impaired Awareness Impairment: Emergent impairment Problem Solving: Impaired Problem Solving Impairment: Functional basic Behaviors: Restless Safety/Judgment: Impaired Cognition Arousal/Alertness: Awake/alert Behavior During Therapy: WFL for tasks assessed/performed Overall Cognitive Status: Impaired/Different from baseline Area of Impairment: Attention, Memory, Following commands, Safety/judgement, Awareness, Problem solving Orientation Level: Disoriented to, Time Current Attention Level: Selective Memory: Decreased short-term memory Following Commands: Follows one step commands consistently Safety/Judgement: Decreased awareness of safety, Decreased awareness of deficits Awareness: Intellectual Problem Solving: Slow processing, Decreased initiation, Requires verbal cues, Requires tactile cues General Comments: Pt with improved interaction today but highly impulsive and attempting to sit unsafely. Poor problem solving skills despite multimodal cues. Pt becoming agitated with therapist when given multi-step commands.  Blood pressure (!) 151/71, pulse 84, temperature 98.6 F (37 C), temperature source Oral, resp. rate 18, height 5\' 10"  (1.778 m), weight 76.6 kg (168 lb 12.8 oz),  SpO2 98 %. Physical Exam  HENT:  Head: Normocephalic.  Eyes: EOM are normal.  Neck: Normal range of motion. Neck supple. No thyromegaly present.  Cardiovascular: Normal rate and regular rhythm.   Respiratory: Effort normal and breath sounds normal. No respiratory distress.  GI: Soft. Bowel sounds are normal. He exhibits no distension.  Neurological:  Patient is lethargic but arousable. He would fall back asleep during exam. He was able to provide his name and date of birth. Followed simple commands. His sister was at bedside.  Skin: Skin is warm and dry.    Lab Results Last 24 Hours       Results for orders placed or performed during the hospital encounter of 03/13/17 (from the past 24 hour(s))  Glucose, capillary     Status: Abnormal   Collection Time: 03/19/17  3:40 PM  Result Value Ref Range   Glucose-Capillary 101 (H) 65 - 99 mg/dL  Glucose, capillary     Status: Abnormal   Collection Time: 03/19/17  9:06 PM  Result Value Ref Range   Glucose-Capillary 134 (H) 65 - 99 mg/dL  Glucose, capillary     Status: Abnormal   Collection Time: 03/19/17 11:23 PM  Result Value Ref Range   Glucose-Capillary 189 (H) 65 - 99 mg/dL  Glucose, capillary     Status: Abnormal   Collection Time: 03/20/17  3:23 AM  Result Value Ref Range   Glucose-Capillary 237 (H) 65 - 99 mg/dL  CBC with Differential/Platelet     Status: Abnormal   Collection Time: 03/20/17  3:41 AM  Result Value Ref Range   WBC 7.5 4.0 - 10.5 K/uL   RBC 3.26 (L) 4.22 - 5.81 MIL/uL   Hemoglobin 8.7 (L) 13.0 - 17.0 g/dL   HCT 26.3 (L) 39.0 - 52.0 %   MCV 80.7 78.0 - 100.0 fL   MCH 26.7 26.0 - 34.0 pg   MCHC 33.1 30.0 - 36.0 g/dL   RDW 15.3 11.5 - 15.5 %   Platelets 252 150 - 400 K/uL   Neutrophils Relative % 79 %   Neutro Abs 6.0 1.7 - 7.7 K/uL   Lymphocytes Relative 12 %   Lymphs Abs 0.9 0.7 - 4.0 K/uL   Monocytes Relative 7 %   Monocytes Absolute 0.6 0.1 - 1.0 K/uL   Eosinophils Relative 2  %   Eosinophils Absolute 0.1 0.0 - 0.7 K/uL   Basophils Relative 0 %   Basophils Absolute 0.0 0.0 - 0.1 K/uL  Renal function panel     Status: Abnormal   Collection Time: 03/20/17  3:41 AM  Result Value  Ref Range   Sodium 132 (L) 135 - 145 mmol/L   Potassium 4.1 3.5 - 5.1 mmol/L   Chloride 93 (L) 101 - 111 mmol/L   CO2 26 22 - 32 mmol/L   Glucose, Bld 239 (H) 65 - 99 mg/dL   BUN 46 (H) 6 - 20 mg/dL   Creatinine, Ser 9.29 (H) 0.61 - 1.24 mg/dL   Calcium 8.0 (L) 8.9 - 10.3 mg/dL   Phosphorus 8.0 (H) 2.5 - 4.6 mg/dL   Albumin 1.8 (L) 3.5 - 5.0 g/dL   GFR calc non Af Amer 5 (L) >60 mL/min   GFR calc Af Amer 6 (L) >60 mL/min   Anion gap 13 5 - 15  Glucose, capillary     Status: Abnormal   Collection Time: 03/20/17  7:54 AM  Result Value Ref Range   Glucose-Capillary 281 (H) 65 - 99 mg/dL  Glucose, capillary     Status: Abnormal   Collection Time: 03/20/17 11:55 AM  Result Value Ref Range   Glucose-Capillary 133 (H) 65 - 99 mg/dL  Glucose, capillary     Status: Abnormal   Collection Time: 03/20/17  2:10 PM  Result Value Ref Range   Glucose-Capillary 46 (L) 65 - 99 mg/dL  Glucose, capillary     Status: Abnormal   Collection Time: 03/20/17  2:27 PM  Result Value Ref Range   Glucose-Capillary 37 (LL) 65 - 99 mg/dL   Comment 1 Notify RN    Comment 2 Document in Chart   Glucose, capillary     Status: None   Collection Time: 03/20/17  2:43 PM  Result Value Ref Range   Glucose-Capillary 94 65 - 99 mg/dL   Comment 1 Notify RN    Comment 2 Document in Chart      Imaging Results (Last 48 hours)  No results found.    Assessment/Plan: Diagnosis: seizure due to hyperglycemia, encephalopathy due to infectious etiology? 1. Does the need for close, 24 hr/day medical supervision in concert with the patient's rehab needs make it unreasonable for this patient to be served in a less intensive setting? Yes 2. Co-Morbidities requiring  supervision/potential complications: ESRD on PD, prostate cancer 3. Due to bladder management, bowel management, safety, skin/wound care, disease management, medication administration, pain management and patient education, does the patient require 24 hr/day rehab nursing? Yes 4. Does the patient require coordinated care of a physician, rehab nurse, PT (1-2 hrs/day, 5 days/week), OT (1-2 hrs/day, 5 days/week) and SLP (1-2 hrs/day, 5 days/week) to address physical and functional deficits in the context of the above medical diagnosis(es)? Yes Addressing deficits in the following areas: balance, endurance, locomotion, strength, transferring, bowel/bladder control, bathing, dressing, feeding, grooming, toileting, cognition, speech, swallowing and psychosocial support 5. Can the patient actively participate in an intensive therapy program of at least 3 hrs of therapy per day at least 5 days per week? Yes 6. The potential for patient to make measurable gains while on inpatient rehab is excellent 7. Anticipated functional outcomes upon discharge from inpatient rehab are modified independent and supervision  with PT, modified independent and supervision with OT, modified independent and supervision with SLP. 8. Estimated rehab length of stay to reach the above functional goals is: 8-12 days 9. Does the patient have adequate social supports and living environment to accommodate these discharge functional goals? Yes 10. Anticipated D/C setting: Home 11. Anticipated post D/C treatments: HH therapy and Outpatient therapy 12. Overall Rehab/Functional Prognosis: excellent  RECOMMENDATIONS: This patient's condition is appropriate  for continued rehabilitative care in the following setting: CIR Patient has agreed to participate in recommended program. Yes Note that insurance prior authorization may be required for reimbursement for recommended care.  Comment: Rehab Admissions Coordinator to follow  up.  Thanks,  [name], MD, Mellody Drown    Cathlyn Parsons., PA-C 03/20/2017    Revision History                        Routing History

## 2017-03-21 NOTE — Progress Notes (Signed)
Patient and family were informed about rehab process including patient safety plan and rehab booklet. 

## 2017-03-21 NOTE — Care Management Note (Addendum)
Case Management Note  Patient Details  Name: Jared [name] MRN: [id number] Date of Birth: 1956-10-10  Subjective/Objective:              CM following for progression and d/c planning.       Action/Plan: 03/21/2017 Noted HH orders however per notes pt most likely will d/c to CIR today. Will follow for any changes or needs.l  10/35 am Notified by Danne Baxter RN, adm corrdinator for CIR that they will be able to move this pt to CIR today.  Expected Discharge Date:     03/21/2017             Expected Discharge Plan:  [place]  In-House Referral:     Discharge planning Services  CM Consult  Post Acute Care Choice:   NA Choice offered to:   NA  DME Arranged:   NA DME Agency:   NA  HH Arranged:   NA HH Agency:   NA  Status of Service:  Completed, signed off  If discussed at [place] of Stay Meetings, dates discussed:    Additional Comments:  Adron Bene, RN 03/21/2017, 10:07 AM

## 2017-03-21 NOTE — Progress Notes (Signed)
Inpt rehab bed is available today for Jared [name] to admit. I met with Jared [name], his girlfriend, and his sister, [name] at bedside. I will make the arrangements. [phone]

## 2017-03-21 NOTE — Progress Notes (Signed)
Patient ID: Jared [name], male   DOB: Dec 17, 1955, 61 y.o.   MRN: [id number]  [facility] Progress Note   Assessment/ Plan:   1. Fever/altered mental status: Mental status improved after what appears to have been metabolic encephalopathy versus ?encephalitis. Given deconditioning, anticipated discharge to CIR later today. 2. ESRD: Continue cyclic peritoneal dialysis at the current prescription with use of 2.5% Dianeal to try and gain better blood pressure control. Voiding status acceptable. 3. Anemia: His hemoglobin levels remain low without any overt loss, continue ESA 4. CKD-MBD: Improving phosphorus levels after restarting phosphorus binder-continue to monitor 5. Hypertension: The blood pressure trend is improving on the current regime of antihypertensive agents-attempt to use higher glucose content of PD solution.  Subjective:   Reports to be feeling better, no further leg pain. Anticipating DC to CIR today.  Objective:   BP 135/74 (BP Location: Right Leg)   Pulse 78   Temp 98.2 F (36.8 C) (Oral)   Resp 19   Ht 5\' 10"  (1.778 m)   Wt 76.2 kg (167 lb 15.9 oz)   SpO2 98%   BMI 24.10 kg/m   Physical Exam: Gen: Resting comfortably in bed with family at bedside CVS: Pulse regular rhythm, normal rate Resp: Clear to auscultation, no rales Abd: Soft, flat, PD catheter in situ Ext: No lower extremity edema  Labs: BMET  Recent Labs Lab 03/15/17 0325 03/16/17 0232 03/16/17 1624 03/17/17 0824 03/18/17 0230 03/20/17 0341 03/21/17 0740  NA 134* 133* 138 138 136 132* 129*  K 5.5* 4.6 4.2 4.0 3.8 4.1 3.8  CL 97* 96* 100* 99* 98* 93* 92*  CO2 21* 19* 22 23 24 26 25   GLUCOSE 88 322* 105* 238* 77 239* 321*  BUN 77* 75* 68* 55* 51* 46* 42*  CREATININE 12.48* 12.21* 12.14* 10.85* 10.39* 9.29* 9.18*  CALCIUM 8.7* 8.3* 8.2* 8.3* 8.0* 8.0* 7.8*  PHOS 9.1* 7.6*  --  7.1* 7.7* 8.0* 6.6*   CBC  Recent Labs Lab 03/16/17 1624 03/17/17 0824 03/18/17 0229  03/19/17 0250 03/20/17 0341 03/21/17 0740  WBC 6.7 6.4 7.1 7.4 7.5 6.2  NEUTROABS 5.1 4.5  --  5.7 6.0  --   HGB 9.1* 9.3* 8.3* 8.9* 8.7* 8.4*  HCT 26.5* 27.8* 25.4* 26.8* 26.3* 24.6*  MCV 80.8 82.2 81.4 81.0 80.7 81.2  PLT 172 165 197 201 252 284   Medications:    . darbepoetin (ARANESP) injection - NON-DIALYSIS  150 mcg Subcutaneous Q Tue-1800  . gentamicin cream  1 application Topical Daily  . heparin subcutaneous  5,000 Units Subcutaneous Q8H  . hydrALAZINE  100 mg Oral TID  . insulin aspart  0-9 Units Subcutaneous TID WC  . insulin glargine  8 Units Subcutaneous BID  . isosorbide mononitrate  15 mg Oral Daily  . levETIRAcetam  250 mg Oral BID  . metoprolol tartrate  100 mg Oral BID  . NIFEdipine  90 mg Oral Daily  . pantoprazole  40 mg Oral Q0600  . pneumococcal 23 valent vaccine  0.5 mL Intramuscular Tomorrow-1000  . sodium chloride flush  3 mL Intravenous Q12H   Elmarie Shiley, MD 03/21/2017, 9:12 AM

## 2017-03-21 NOTE — Progress Notes (Signed)
[name], MD Physician Signed Physical Medicine and Rehabilitation  Consult Note Date of Service: 03/20/2017 2:44 PM  Related encounter: ED to Hosp-Admission (Discharged) from 03/13/2017 in [facility] All Collapse All   [] Hide copied text [] Hover for attribution information      Physical Medicine and Rehabilitation Consult Reason for Consult: Altered mental status with decreased functional mobility Referring Physician: [facility]   HPI: Jared [name] is a 61 y.o. right hand male with history of end-stage renal disease with peritoneal dialysis, hypertension, diabetes mellitus, prostate cancer and prior seizure disorder. Per chart review patient lives with sister and brother-in-law. Independent prior to admission he does his own peritoneal dialysis at home and drives. Family plans assistance as needed but limited physically. Presented 03/13/2017 with altered mental status and slurred speech. Denied any recent trauma. Lactic acid 1.35, sodium 122, potassium 5.6, creatinine 9.88. MRI of the brain negative. MRA unremarkable. CT of the abdomen showed moderate volume of ascites stable as compared to January 2018. EEG negative for seizure. Low-grade fever infectious disease consult did blood cultures negative etiology of syndrome of unclear in regards to fever. Neurology follow-up suspect acute encephalopathy. Follow-up renal services in regards to dialysis. Occupational therapy 03/20/2017 no tissue in regards to generalized weakness decreased safety risk of falls. Recommendations made for physical medicine rehabilitation consult.   Review of Systems  Constitutional: Positive for chills and fever.  HENT: Negative for hearing loss.   Eyes: Negative for blurred vision and double vision.  Respiratory: Negative for cough and shortness of breath.   Cardiovascular: Positive for leg swelling. Negative for chest pain and palpitations.    Gastrointestinal: Positive for constipation. Negative for nausea and vomiting.  Genitourinary: Negative for hematuria.  Musculoskeletal: Positive for joint pain and myalgias.  Skin: Negative for rash.  Neurological: Positive for seizures and weakness.  Psychiatric/Behavioral: The patient has insomnia.   All other systems reviewed and are negative.      Past Medical History:  Diagnosis Date  . Anemia   . Chronic kidney disease    on dialysis M-W-F  . Diabetes mellitus without complication ([place])    onset as adult  . Hypertension   . Seizures ([place])   . Stroke ([place])   . Thyroid disease    hyperparathyroidism        Past Surgical History:  Procedure Laterality Date  . AV FISTULA PLACEMENT Left 06/04/2014   Procedure: ARTERIOVENOUS (AV) FISTULA CREATION-  BRACHIOCEPHALIC WITH LIGATION OF COMPETEING BRANCH;  Surgeon: Mal Misty, MD;  Location: [facility];  Service: Vascular;  Laterality: Left;  . INSERTION OF DIALYSIS CATHETER  04/2014  . IR GENERIC HISTORICAL Left 11/30/2016   IR THROMBECTOMY AV FISTULA W/THROMBOLYSIS/PTA/STENT INC/SHUNT/IMG LT 11/30/2016 Markus Daft, MD [facility]-INTERV RAD  . IR GENERIC HISTORICAL  11/30/2016   IR US GUIDE VASC ACCESS LEFT 11/30/2016 Markus Daft, MD [facility]        Family History  Problem Relation Age of Onset  . Cancer - Lung Mother   . Cancer - Other Father    Social History:  reports that he quit smoking about 32 years ago. He has never used smokeless tobacco. He reports that he drinks about 0.6 oz of alcohol per week . He reports that he does not use drugs. Allergies:       Allergies  Allergen Reactions  . Penicillins Other (See Comments)    Has patient had a PCN reaction causing immediate rash,  facial/tongue/throat swelling, SOB or lightheadedness with hypotension: Unk Has patient had a PCN reaction causing severe rash involving mucus membranes or skin necrosis: Unk Has patient had a PCN reaction that required hospitalization:  Unk Has patient had a PCN reaction occurring within the last 10 years: Unk If all of the above answers are "NO", then may proceed with Cephalosporin use.          Medications Prior to Admission  Medication Sig Dispense Refill  . amLODipine (NORVASC) 10 MG tablet Take 1 tablet (10 mg total) by mouth daily. 30 tablet 0  . atorvastatin (LIPITOR) 20 MG tablet Take 1 tablet (20 mg total) by mouth daily. 90 tablet 3  . calcitRIOL (ROCALTROL) 0.5 MCG capsule Take 1 capsule (0.5 mcg total) by mouth daily. 30 capsule 0  . calcium carbonate (TUMS - DOSED IN MG ELEMENTAL CALCIUM) 500 MG chewable tablet Chew 2 tablets (400 mg of elemental calcium total) by mouth 3 (three) times daily with meals. 30 tablet 0  . gentamicin cream (GARAMYCIN) 0.1 % Apply 1 application topically daily. To PD site 15 g 0  . hydrALAZINE (APRESOLINE) 100 MG tablet Take 1 tablet (100 mg total) by mouth 3 (three) times daily. 90 tablet 0  . insulin NPH Human (HUMULIN N,NOVOLIN N) 100 UNIT/ML injection Inject 0.15 mLs (15 Units total) into the skin 2 (two) times daily at 8 am and 10 pm. 10 mL 0  . metoprolol (LOPRESSOR) 100 MG tablet Take 1 tablet (100 mg total) by mouth 2 (two) times daily. 60 tablet 0  . multivitamin (RENA-VIT) TABS tablet Take 1 tablet by mouth daily.    Marland Kitchen senna-docusate (SENOKOT-S) 8.6-50 MG tablet Take 2 tablets by mouth at bedtime. 60 tablet 0  . sevelamer carbonate (RENVELA) 800 MG tablet Take 800 mg by mouth 3 (three) times daily with meals.    . temazepam (RESTORIL) 15 MG capsule Take 15 mg by mouth at bedtime.      Home: Home Living Family/patient expects to be discharged to:: Private residence Living Arrangements: Other relatives (sisters ) Available Help at Discharge: Family, Available 24 hours/day Type of Home: House Home Access: Level entry Home Layout: Two level, Bed/bath upstairs Alternate Level Stairs-Number of Steps: 1 flight Alternate Level Stairs-Rails: Left, Right Bathroom  Shower/Tub: Multimedia programmer: Standard Bathroom Accessibility: Yes Home Equipment: None  Lives With: Family  Functional History: Prior Function Level of Independence: Independent Comments: Works periodically transporting cars; 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  Functional Status:  Mobility: Bed Mobility Overal bed mobility: Needs Assistance Bed Mobility: Supine to Sit, Sit to Supine Supine to sit: Min guard Sit to supine: Min guard General bed mobility comments: No physical assistance but min guard for safety. Transfers Overall transfer level: Needs assistance Equipment used: None Transfers: Sit to/from Stand Sit to Stand: Min assist General transfer comment: Min assist for trunk support. Ambulation/Gait Ambulation/Gait assistance: Min assist, +2 safety/equipment Ambulation Distance (Feet): 15 Feet (15) Assistive device: None, Rolling walker (2 wheeled) Gait Pattern/deviations: Step-through pattern, Decreased weight shift to right, Staggering right, Decreased stride length General Gait Details: Amb to bathroom with no AD and minA for balance; increased lateral sway with RLE instability, requiring minA for balance; scissoring with RLE, with potential proprioceptive issues (??). Amb from bathroom to bed with RW and min guard for balance with RLE instability; required verbal cues for hand placement with RW.   ADL: ADL Overall ADL's : Needs assistance/impaired Eating/Feeding: NPO Grooming: Minimal assistance, Sitting Upper Body Bathing:  Total assistance, Bed level Lower Body Bathing: Total assistance, Bed level Upper Body Dressing : Total assistance, Bed level Lower Body Dressing: Total assistance, Bed level Toilet Transfer: Minimal assistance, Moderate assistance, RW, Ambulation Toilet Transfer Details (indicate cue type and reason): Pt unsteady with R knee buckling. Required cues to utilize RW safely. Toileting- Clothing Manipulation and Hygiene: Total  assistance, Bed level Functional mobility during ADLs: Minimal assistance, Moderate assistance, Rolling walker General ADL Comments: Pt unsteady this session with mobility and impulsive. Pt's sister and son present during session and educated them concerning need for 24/7 hands on assistance to ensure safety post-acute D/C. Family in agreement that comprehensive rehabilitation in an inpatient setting is most beneficial for pt. However, pt is adamant that he return home.  Cognition: Cognition Overall Cognitive Status: Impaired/Different from baseline Arousal/Alertness: Lethargic Orientation Level: Oriented to person, Oriented to place, Oriented to time Attention: Focused Focused Attention: Impaired Focused Attention Impairment: Verbal basic, Functional basic Awareness: Impaired Awareness Impairment: Emergent impairment Problem Solving: Impaired Problem Solving Impairment: Functional basic Behaviors: Restless Safety/Judgment: Impaired Cognition Arousal/Alertness: Awake/alert Behavior During Therapy: WFL for tasks assessed/performed Overall Cognitive Status: Impaired/Different from baseline Area of Impairment: Attention, Memory, Following commands, Safety/judgement, Awareness, Problem solving Orientation Level: Disoriented to, Time Current Attention Level: Selective Memory: Decreased short-term memory Following Commands: Follows one step commands consistently Safety/Judgement: Decreased awareness of safety, Decreased awareness of deficits Awareness: Intellectual Problem Solving: Slow processing, Decreased initiation, Requires verbal cues, Requires tactile cues General Comments: Pt with improved interaction today but highly impulsive and attempting to sit unsafely. Poor problem solving skills despite multimodal cues. Pt becoming agitated with therapist when given multi-step commands.  Blood pressure (!) 151/71, pulse 84, temperature 98.6 F (37 C), temperature source Oral, resp. rate  18, height 5\' 10"  (1.778 m), weight 76.6 kg (168 lb 12.8 oz), SpO2 98 %. Physical Exam  HENT:  Head: Normocephalic.  Eyes: EOM are normal.  Neck: Normal range of motion. Neck supple. No thyromegaly present.  Cardiovascular: Normal rate and regular rhythm.   Respiratory: Effort normal and breath sounds normal. No respiratory distress.  GI: Soft. Bowel sounds are normal. He exhibits no distension.  Neurological:  Patient is lethargic but arousable. He would fall back asleep during exam. He was able to provide his name and date of birth. Followed simple commands. His sister was at bedside.  Skin: Skin is warm and dry.    Lab Results Last 24 Hours       Results for orders placed or performed during the hospital encounter of 03/13/17 (from the past 24 hour(s))  Glucose, capillary     Status: Abnormal   Collection Time: 03/19/17  3:40 PM  Result Value Ref Range   Glucose-Capillary 101 (H) 65 - 99 mg/dL  Glucose, capillary     Status: Abnormal   Collection Time: 03/19/17  9:06 PM  Result Value Ref Range   Glucose-Capillary 134 (H) 65 - 99 mg/dL  Glucose, capillary     Status: Abnormal   Collection Time: 03/19/17 11:23 PM  Result Value Ref Range   Glucose-Capillary 189 (H) 65 - 99 mg/dL  Glucose, capillary     Status: Abnormal   Collection Time: 03/20/17  3:23 AM  Result Value Ref Range   Glucose-Capillary 237 (H) 65 - 99 mg/dL  CBC with Differential/Platelet     Status: Abnormal   Collection Time: 03/20/17  3:41 AM  Result Value Ref Range   WBC 7.5 4.0 - 10.5 K/uL   RBC 3.26 (  L) 4.22 - 5.81 MIL/uL   Hemoglobin 8.7 (L) 13.0 - 17.0 g/dL   HCT 26.3 (L) 39.0 - 52.0 %   MCV 80.7 78.0 - 100.0 fL   MCH 26.7 26.0 - 34.0 pg   MCHC 33.1 30.0 - 36.0 g/dL   RDW 15.3 11.5 - 15.5 %   Platelets 252 150 - 400 K/uL   Neutrophils Relative % 79 %   Neutro Abs 6.0 1.7 - 7.7 K/uL   Lymphocytes Relative 12 %   Lymphs Abs 0.9 0.7 - 4.0 K/uL   Monocytes Relative 7 %    Monocytes Absolute 0.6 0.1 - 1.0 K/uL   Eosinophils Relative 2 %   Eosinophils Absolute 0.1 0.0 - 0.7 K/uL   Basophils Relative 0 %   Basophils Absolute 0.0 0.0 - 0.1 K/uL  Renal function panel     Status: Abnormal   Collection Time: 03/20/17  3:41 AM  Result Value Ref Range   Sodium 132 (L) 135 - 145 mmol/L   Potassium 4.1 3.5 - 5.1 mmol/L   Chloride 93 (L) 101 - 111 mmol/L   CO2 26 22 - 32 mmol/L   Glucose, Bld 239 (H) 65 - 99 mg/dL   BUN 46 (H) 6 - 20 mg/dL   Creatinine, Ser 9.29 (H) 0.61 - 1.24 mg/dL   Calcium 8.0 (L) 8.9 - 10.3 mg/dL   Phosphorus 8.0 (H) 2.5 - 4.6 mg/dL   Albumin 1.8 (L) 3.5 - 5.0 g/dL   GFR calc non Af Amer 5 (L) >60 mL/min   GFR calc Af Amer 6 (L) >60 mL/min   Anion gap 13 5 - 15  Glucose, capillary     Status: Abnormal   Collection Time: 03/20/17  7:54 AM  Result Value Ref Range   Glucose-Capillary 281 (H) 65 - 99 mg/dL  Glucose, capillary     Status: Abnormal   Collection Time: 03/20/17 11:55 AM  Result Value Ref Range   Glucose-Capillary 133 (H) 65 - 99 mg/dL  Glucose, capillary     Status: Abnormal   Collection Time: 03/20/17  2:10 PM  Result Value Ref Range   Glucose-Capillary 46 (L) 65 - 99 mg/dL  Glucose, capillary     Status: Abnormal   Collection Time: 03/20/17  2:27 PM  Result Value Ref Range   Glucose-Capillary 37 (LL) 65 - 99 mg/dL   Comment 1 Notify RN    Comment 2 Document in Chart   Glucose, capillary     Status: None   Collection Time: 03/20/17  2:43 PM  Result Value Ref Range   Glucose-Capillary 94 65 - 99 mg/dL   Comment 1 Notify RN    Comment 2 Document in Chart      Imaging Results (Last 48 hours)  No results found.    Assessment/Plan: Diagnosis: seizure due to hyperglycemia, encephalopathy due to infectious etiology? 1. Does the need for close, 24 hr/day medical supervision in concert with the patient's rehab needs make it unreasonable for this patient to be served in a less  intensive setting? Yes 2. Co-Morbidities requiring supervision/potential complications: ESRD on PD, prostate cancer 3. Due to bladder management, bowel management, safety, skin/wound care, disease management, medication administration, pain management and patient education, does the patient require 24 hr/day rehab nursing? Yes 4. Does the patient require coordinated care of a physician, rehab nurse, PT (1-2 hrs/day, 5 days/week), OT (1-2 hrs/day, 5 days/week) and SLP (1-2 hrs/day, 5 days/week) to address physical and functional deficits in the context  of the above medical diagnosis(es)? Yes Addressing deficits in the following areas: balance, endurance, locomotion, strength, transferring, bowel/bladder control, bathing, dressing, feeding, grooming, toileting, cognition, speech, swallowing and psychosocial support 5. Can the patient actively participate in an intensive therapy program of at least 3 hrs of therapy per day at least 5 days per week? Yes 6. The potential for patient to make measurable gains while on inpatient rehab is excellent 7. Anticipated functional outcomes upon discharge from inpatient rehab are modified independent and supervision  with PT, modified independent and supervision with OT, modified independent and supervision with SLP. 8. Estimated rehab length of stay to reach the above functional goals is: 8-12 days 9. Does the patient have adequate social supports and living environment to accommodate these discharge functional goals? Yes 10. Anticipated D/C setting: Home 11. Anticipated post D/C treatments: HH therapy and Outpatient therapy 12. Overall Rehab/Functional Prognosis: excellent  RECOMMENDATIONS: This patient's condition is appropriate for continued rehabilitative care in the following setting: CIR Patient has agreed to participate in recommended program. Yes Note that insurance prior authorization may be required for reimbursement for recommended care.  Comment:  Rehab Admissions Coordinator to follow up.  Thanks,  [name], MD, Mellody Drown    Cathlyn Parsons., PA-C 03/20/2017    Revision History                   Routing History

## 2017-03-21 NOTE — H&P (Signed)
Physical Medicine and Rehabilitation Admission H&P    Chief Complaint  Patient presents with  . Altered Mental Status  : HPI: Jared [name] is a 61 y.o. right hand male with history of end-stage renal disease with peritoneal dialysis, chronic cough, hypertension, diabetes mellitus, prostate cancer maintained on Lupron injections followed as outpatient and prior seizure disorder in the setting of hypoglycemia. History taken from chart review and family. Patient well known to inpatient rehabilitation services from admission 12/01/2016 for encephalopathy due to seizure. Patient lives with sister and brother-in-law. Independent prior to admission he does his own peritoneal dialysis at home and drives. Family plans assistance as needed but limited physically. Presented 03/13/2017 with altered mental status and slurred speech. Denied any recent trauma. Glucose 1127, lactic acid 1.35, sodium 122, potassium 5.6, creatinine 9.88, troponin negative. MRI of the brain reviewed, unremarkable for acute process. MRA unremarkable. CT of the abdomen showed moderate volume of ascites stable as compared to January 2018. EEG negative for seizure. Low-grade fever infectious disease consult with blood cultures negative etiology of syndrome of unclear in regards to fever and initially placed on empiric antibiotics since discontinued. Neurology follow-up suspect acute encephalopathy. Follow-up renal services in regards to dialysis. Subcutaneous heparin for DVT prophylaxis. Dysphagia #2 thin liquid diet. Occupational therapy 03/20/2017 With noted generalized weakness, impulsivity and decreased safety awareness. Recommendations made for physical medicine rehabilitation consult.Patient was admitted for a comprehensive rehabilitation program  Review of Systems  HENT: Negative for hearing loss.   Eyes: Negative for blurred vision and double vision.  Respiratory: Positive for cough. Negative for shortness of breath.     Cardiovascular: Negative for chest pain and palpitations.  Gastrointestinal: Positive for constipation. Negative for nausea and vomiting.  Genitourinary: Negative for flank pain.  Musculoskeletal: Positive for joint pain and myalgias.  Skin: Negative for rash.  Neurological: Positive for seizures and weakness.  Psychiatric/Behavioral: The patient has insomnia.   All other systems reviewed and are negative.  Past Medical History:  Diagnosis Date  . Anemia   . Chronic kidney disease    on dialysis M-W-F  . Diabetes mellitus without complication ([place])    onset as adult  . Hypertension   . Seizures ([place])   . Stroke ([place])   . Thyroid disease    hyperparathyroidism   Past Surgical History:  Procedure Laterality Date  . AV FISTULA PLACEMENT Left 06/04/2014   Procedure: ARTERIOVENOUS (AV) FISTULA CREATION-  BRACHIOCEPHALIC WITH LIGATION OF COMPETEING BRANCH;  Surgeon: Mal Misty, MD;  Location: [facility];  Service: Vascular;  Laterality: Left;  . INSERTION OF DIALYSIS CATHETER  04/2014  . IR GENERIC HISTORICAL Left 11/30/2016   IR THROMBECTOMY AV FISTULA W/THROMBOLYSIS/PTA/STENT INC/SHUNT/IMG LT 11/30/2016 Markus Daft, MD [facility]  . IR GENERIC HISTORICAL  11/30/2016   IR US GUIDE VASC ACCESS LEFT 11/30/2016 Markus Daft, MD [facility]   Family History  Problem Relation Age of Onset  . Cancer - Lung Mother   . Cancer - Other Father    Social History:  reports that he quit smoking about 32 years ago. He has never used smokeless tobacco. He reports that he drinks about 0.6 oz of alcohol per week . He reports that he does not use drugs. Allergies:  Allergies  Allergen Reactions  . Penicillins Other (See Comments)    Has patient had a PCN reaction causing immediate rash, facial/tongue/throat swelling, SOB or lightheadedness with hypotension: Unk Has patient had a PCN reaction causing severe rash involving mucus  membranes or skin necrosis: Unk Has patient had a PCN reaction that required  hospitalization: Unk Has patient had a PCN reaction occurring within the last 10 years: Unk If all of the above answers are "NO", then may proceed with Cephalosporin use.    Medications Prior to Admission  Medication Sig Dispense Refill  . amLODipine (NORVASC) 10 MG tablet Take 1 tablet (10 mg total) by mouth daily. 30 tablet 0  . atorvastatin (LIPITOR) 20 MG tablet Take 1 tablet (20 mg total) by mouth daily. 90 tablet 3  . calcitRIOL (ROCALTROL) 0.5 MCG capsule Take 1 capsule (0.5 mcg total) by mouth daily. 30 capsule 0  . calcium carbonate (TUMS - DOSED IN MG ELEMENTAL CALCIUM) 500 MG chewable tablet Chew 2 tablets (400 mg of elemental calcium total) by mouth 3 (three) times daily with meals. 30 tablet 0  . gentamicin cream (GARAMYCIN) 0.1 % Apply 1 application topically daily. To PD site 15 g 0  . hydrALAZINE (APRESOLINE) 100 MG tablet Take 1 tablet (100 mg total) by mouth 3 (three) times daily. 90 tablet 0  . insulin NPH Human (HUMULIN N,NOVOLIN N) 100 UNIT/ML injection Inject 0.15 mLs (15 Units total) into the skin 2 (two) times daily at 8 am and 10 pm. 10 mL 0  . metoprolol (LOPRESSOR) 100 MG tablet Take 1 tablet (100 mg total) by mouth 2 (two) times daily. 60 tablet 0  . multivitamin (RENA-VIT) TABS tablet Take 1 tablet by mouth daily.    Marland Kitchen senna-docusate (SENOKOT-S) 8.6-50 MG tablet Take 2 tablets by mouth at bedtime. 60 tablet 0  . sevelamer carbonate (RENVELA) 800 MG tablet Take 800 mg by mouth 3 (three) times daily with meals.    . temazepam (RESTORIL) 15 MG capsule Take 15 mg by mouth at bedtime.      Home: Home Living Family/patient expects to be discharged to:: Private residence Living Arrangements:  (lives with sister, [name]) Available Help at Discharge: Family, Available 24 hours/day Type of Home: House Home Access: Level entry Home Layout: Two level, Bed/bath upstairs Alternate Level Stairs-Number of Steps: 1 flight Alternate Level Stairs-Rails: Left, Right Bathroom  Shower/Tub: Multimedia programmer: Standard Bathroom Accessibility: Yes Home Equipment: None  Lives With: Family   Functional History: Prior Function Level of Independence: Independent Comments: Works periodically transporting cars; 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   Functional Status:  Mobility: Bed Mobility Overal bed mobility: Needs Assistance Bed Mobility: Supine to Sit, Sit to Supine Supine to sit: Min guard Sit to supine: Min guard General bed mobility comments: No physical assistance but min guard for safety. Transfers Overall transfer level: Needs assistance Equipment used: None Transfers: Sit to/from Stand Sit to Stand: Min assist General transfer comment: Min assist for trunk support. Ambulation/Gait Ambulation/Gait assistance: Min assist, +2 safety/equipment Ambulation Distance (Feet): 15 Feet (15) Assistive device: None, Rolling walker (2 wheeled) Gait Pattern/deviations: Step-through pattern, Decreased weight shift to right, Staggering right, Decreased stride length General Gait Details: Amb to bathroom with no AD and minA for balance; increased lateral sway with RLE instability, requiring minA for balance; scissoring with RLE, with potential proprioceptive issues (??). Amb from bathroom to bed with RW and min guard for balance with RLE instability; required verbal cues for hand placement with RW.     ADL: ADL Overall ADL's : Needs assistance/impaired Eating/Feeding: NPO Grooming: Minimal assistance, Sitting Upper Body Bathing: Total assistance, Bed level Lower Body Bathing: Total assistance, Bed level Upper Body Dressing : Total assistance, Bed level Lower  Body Dressing: Total assistance, Bed level Toilet Transfer: Minimal assistance, Moderate assistance, RW, Ambulation Toilet Transfer Details (indicate cue type and reason): Pt unsteady with R knee buckling. Required cues to utilize RW safely. Toileting- Clothing Manipulation and Hygiene: Total  assistance, Bed level Functional mobility during ADLs: Minimal assistance, Moderate assistance, Rolling walker General ADL Comments: Pt unsteady this session with mobility and impulsive. Pt's sister and son present during session and educated them concerning need for 24/7 hands on assistance to ensure safety post-acute D/C. Family in agreement that comprehensive rehabilitation in an inpatient setting is most beneficial for pt. However, pt is adamant that he return home.  Cognition: Cognition Overall Cognitive Status: Impaired/Different from baseline Arousal/Alertness: Lethargic Orientation Level: Oriented to person, Oriented to place Attention: Focused Focused Attention: Impaired Focused Attention Impairment: Verbal basic, Functional basic Awareness: Impaired Awareness Impairment: Emergent impairment Problem Solving: Impaired Problem Solving Impairment: Functional basic Behaviors: Restless Safety/Judgment: Impaired Cognition Arousal/Alertness: Awake/alert Behavior During Therapy: WFL for tasks assessed/performed Overall Cognitive Status: Impaired/Different from baseline Area of Impairment: Attention, Memory, Following commands, Safety/judgement, Awareness, Problem solving Orientation Level: Disoriented to, Time Current Attention Level: Selective Memory: Decreased short-term memory Following Commands: Follows one step commands consistently Safety/Judgement: Decreased awareness of safety, Decreased awareness of deficits Awareness: Intellectual Problem Solving: Slow processing, Decreased initiation, Requires verbal cues, Requires tactile cues General Comments: Pt with improved interaction today but highly impulsive and attempting to sit unsafely. Poor problem solving skills despite multimodal cues. Pt becoming agitated with therapist when given multi-step commands.  Physical Exam: Blood pressure 135/74, pulse 78, temperature 98.2 F (36.8 C), temperature source Oral, resp. rate 19,  height '5\' 10"'$  (1.778 m), weight 76.2 kg (167 lb 15.9 oz), SpO2 98 %. Physical Exam  Vitals reviewed. Constitutional: He appears well-developed and well-nourished.  HENT:  Head: Normocephalic and atraumatic.  Eyes: Conjunctivae and EOM are normal. Right eye exhibits no discharge. Left eye exhibits no discharge.  Neck: Normal range of motion. Neck supple. No thyromegaly present.  Cardiovascular: Normal rate and regular rhythm.   Respiratory: Effort normal and breath sounds normal. No respiratory distress.  GI: Soft. Bowel sounds are normal. He exhibits no distension. There is no tenderness.  Musculoskeletal: He exhibits no edema or tenderness.  Neurological: He is alert.  Lethargic but arousable.  A&Ox2 Followed simple commands.  Motor: B/l UE 4/5 proximal to distal B/l LE: 4+/5 proximal to distal  Skin: Skin is warm and dry.  Psychiatric: His affect is blunt. His speech is delayed. He is slowed.   Results for orders placed or performed during the hospital encounter of 03/13/17 (from the past 48 hour(s))  Glucose, capillary     Status: Abnormal   Collection Time: 03/19/17  9:15 AM  Result Value Ref Range   Glucose-Capillary 137 (H) 65 - 99 mg/dL  Culture, blood (Routine X 2) w Reflex to ID Panel     Status: None (Preliminary result)   Collection Time: 03/19/17 11:00 AM  Result Value Ref Range   Specimen Description BLOOD RIGHT HAND    Special Requests      BOTTLES DRAWN AEROBIC ONLY Blood Culture adequate volume   Culture NO GROWTH < 24 HOURS    Report Status PENDING   Glucose, capillary     Status: Abnormal   Collection Time: 03/19/17 12:09 PM  Result Value Ref Range   Glucose-Capillary 158 (H) 65 - 99 mg/dL   Comment 1 Notify RN    Comment 2 Document in Chart   Glucose, capillary  Status: Abnormal   Collection Time: 03/19/17  3:40 PM  Result Value Ref Range   Glucose-Capillary 101 (H) 65 - 99 mg/dL  Glucose, capillary     Status: Abnormal   Collection Time: 03/19/17   9:06 PM  Result Value Ref Range   Glucose-Capillary 134 (H) 65 - 99 mg/dL  Glucose, capillary     Status: Abnormal   Collection Time: 03/19/17 11:23 PM  Result Value Ref Range   Glucose-Capillary 189 (H) 65 - 99 mg/dL  Glucose, capillary     Status: Abnormal   Collection Time: 03/20/17  3:23 AM  Result Value Ref Range   Glucose-Capillary 237 (H) 65 - 99 mg/dL  CBC with Differential/Platelet     Status: Abnormal   Collection Time: 03/20/17  3:41 AM  Result Value Ref Range   WBC 7.5 4.0 - 10.5 K/uL   RBC 3.26 (L) 4.22 - 5.81 MIL/uL   Hemoglobin 8.7 (L) 13.0 - 17.0 g/dL   HCT 26.3 (L) 39.0 - 52.0 %   MCV 80.7 78.0 - 100.0 fL   MCH 26.7 26.0 - 34.0 pg   MCHC 33.1 30.0 - 36.0 g/dL   RDW 15.3 11.5 - 15.5 %   Platelets 252 150 - 400 K/uL   Neutrophils Relative % 79 %   Neutro Abs 6.0 1.7 - 7.7 K/uL   Lymphocytes Relative 12 %   Lymphs Abs 0.9 0.7 - 4.0 K/uL   Monocytes Relative 7 %   Monocytes Absolute 0.6 0.1 - 1.0 K/uL   Eosinophils Relative 2 %   Eosinophils Absolute 0.1 0.0 - 0.7 K/uL   Basophils Relative 0 %   Basophils Absolute 0.0 0.0 - 0.1 K/uL  Renal function panel     Status: Abnormal   Collection Time: 03/20/17  3:41 AM  Result Value Ref Range   Sodium 132 (L) 135 - 145 mmol/L   Potassium 4.1 3.5 - 5.1 mmol/L   Chloride 93 (L) 101 - 111 mmol/L   CO2 26 22 - 32 mmol/L   Glucose, Bld 239 (H) 65 - 99 mg/dL   BUN 46 (H) 6 - 20 mg/dL   Creatinine, Ser 9.29 (H) 0.61 - 1.24 mg/dL   Calcium 8.0 (L) 8.9 - 10.3 mg/dL   Phosphorus 8.0 (H) 2.5 - 4.6 mg/dL   Albumin 1.8 (L) 3.5 - 5.0 g/dL   GFR calc non Af Amer 5 (L) >60 mL/min   GFR calc Af Amer 6 (L) >60 mL/min    Comment: (NOTE) The eGFR has been calculated using the CKD EPI equation. This calculation has not been validated in all clinical situations. eGFR's persistently <60 mL/min signify possible Chronic Kidney Disease.    Anion gap 13 5 - 15  Glucose, capillary     Status: Abnormal   Collection Time: 03/20/17   7:54 AM  Result Value Ref Range   Glucose-Capillary 281 (H) 65 - 99 mg/dL  Glucose, capillary     Status: Abnormal   Collection Time: 03/20/17 11:55 AM  Result Value Ref Range   Glucose-Capillary 133 (H) 65 - 99 mg/dL  Glucose, capillary     Status: Abnormal   Collection Time: 03/20/17  2:10 PM  Result Value Ref Range   Glucose-Capillary 46 (L) 65 - 99 mg/dL  Glucose, capillary     Status: Abnormal   Collection Time: 03/20/17  2:27 PM  Result Value Ref Range   Glucose-Capillary 37 (LL) 65 - 99 mg/dL   Comment 1 Notify RN  Comment 2 Document in Chart   Glucose, capillary     Status: None   Collection Time: 03/20/17  2:43 PM  Result Value Ref Range   Glucose-Capillary 94 65 - 99 mg/dL   Comment 1 Notify RN    Comment 2 Document in Chart   Glucose, capillary     Status: Abnormal   Collection Time: 03/20/17  3:30 PM  Result Value Ref Range   Glucose-Capillary 105 (H) 65 - 99 mg/dL   Comment 1 Notify RN    Comment 2 Document in Chart   Glucose, capillary     Status: Abnormal   Collection Time: 03/20/17  6:28 PM  Result Value Ref Range   Glucose-Capillary 120 (H) 65 - 99 mg/dL   Comment 1 Notify RN    Comment 2 Document in Chart   Glucose, capillary     Status: Abnormal   Collection Time: 03/20/17  8:06 PM  Result Value Ref Range   Glucose-Capillary 135 (H) 65 - 99 mg/dL  Glucose, capillary     Status: Abnormal   Collection Time: 03/20/17 10:17 PM  Result Value Ref Range   Glucose-Capillary 195 (H) 65 - 99 mg/dL  Glucose, capillary     Status: Abnormal   Collection Time: 03/21/17 12:29 AM  Result Value Ref Range   Glucose-Capillary 231 (H) 65 - 99 mg/dL  Glucose, capillary     Status: Abnormal   Collection Time: 03/21/17  4:23 AM  Result Value Ref Range   Glucose-Capillary 271 (H) 65 - 99 mg/dL   No results found.  Medical Problem List and Plan: 1.  Decreased functional mobility, slurred speech secondary to metabolic encephalopathy/fever/ hyperglycemia 2.  DVT  Prophylaxis/Anticoagulation: Subcutaneous heparin. Monitor platelet counts of any signs of bleeding 3. Pain Management: Tylenol as needed 4. Mood: Ativan as needed 5. Neuropsych: This patient is capable of making decisions on his own behalf. 6. Skin/Wound Care: Routine skin checks 7. Fluids/Electrolytes/Nutrition: Routine I&O with follow-up chemistries 8. ESRD. Hemodialysis as per renal services 9. Hypertension. Hydralazine 100 mg 3 times a day, Lopressor 100 mg twice a day, Imdur 15 mg daily, nifedipine 90 mg daily. Monitor with increased mobility 10. Fever. Blood cultures negative. Follow-up infectious disease. Empiric antibiotics discontinued 11. Seizure disorder. Keppra 250 mg twice a day 12. Diabetes mellitus and peripheral neuropathy. Hemoglobin A1c 8.5. Lantus insulin 8 units twice a day. Check blood sugars before meals and at bedtime. Diabetic teaching 13. History of prostate cancer. Follow-up outpatient Lupron injections with urology services. 14. Acute on chronic anemia. Aranesp  Post Admission Physician Evaluation: 1. Preadmission assessment reviewed and changes made below. 2. Functional deficits secondary  to encephalopathy. 3. Patient is admitted to receive collaborative, interdisciplinary care between the physiatrist, rehab nursing staff, and therapy team. 4. Patient's level of medical complexity and substantial therapy needs in context of that medical necessity cannot be provided at a lesser intensity of care such as a SNF. 5. Patient has experienced substantial functional loss from his/her baseline which was documented above under the "Functional History" and "Functional Status" headings.  Judging by the patient's diagnosis, physical exam, and functional history, the patient has potential for functional progress which will result in measurable gains while on inpatient rehab.  These gains will be of substantial and practical use upon discharge  in facilitating mobility and self-care  at the household level. 6. Physiatrist will provide 24 hour management of medical needs as well as oversight of the therapy plan/treatment and provide guidance as  appropriate regarding the interaction of the two. 7. The Preadmission Screening has been reviewed and patient status is unchanged unless otherwise stated above. 8. 24 hour rehab nursing will assist with bladder management, safety, disease management, medication administration and patient education  and help integrate therapy concepts, techniques,education, etc. 9. PT will assess and treat for/with: Lower extremity strength, range of motion, stamina, balance, functional mobility, safety, adaptive techniques and equipment, coping skills, pain control, education.   Goals are: Supervision. 10. OT will assess and treat for/with: ADL's, functional mobility, safety, upper extremity strength, adaptive techniques and equipment, ego support, and community reintegration.   Goals are: Supervision. Therapy may proceed with showering this patient. 11. SLP will assess and treat for/with: swallowing, cognition.  Goals are: Supervision/Mod I. 12. Case Management and Social Worker will assess and treat for psychological issues and discharge planning. 66. Team conference will be held weekly to assess progress toward goals and to determine barriers to discharge. 14. Patient will receive at least 3 hours of therapy per day at least 5 days per week. 15. ELOS: 10-14 days.       16. Prognosis:  good  Delice Lesch, MD, Mellody Drown Cathlyn Parsons., PA-C 03/21/2017

## 2017-03-21 NOTE — Care Management Important Message (Signed)
Important Message  Patient Details  Name: Jared [name] MRN: [id number] Date of Birth: 1956-08-04   Medicare Important Message Given:  Yes    [name] 03/21/2017, 12:12 PM

## 2017-03-22 ENCOUNTER — Inpatient Hospital Stay (HOSPITAL_COMMUNITY): Payer: Medicare Other | Admitting: Occupational Therapy

## 2017-03-22 ENCOUNTER — Inpatient Hospital Stay (HOSPITAL_COMMUNITY): Payer: Medicare Other

## 2017-03-22 ENCOUNTER — Inpatient Hospital Stay (HOSPITAL_COMMUNITY): Payer: Medicare Other | Admitting: Speech Pathology

## 2017-03-22 DIAGNOSIS — G9341 Metabolic encephalopathy: Secondary | ICD-10-CM

## 2017-03-22 LAB — GLUCOSE, CAPILLARY
GLUCOSE-CAPILLARY: 84 mg/dL (ref 65–99)
Glucose-Capillary: 132 mg/dL — ABNORMAL HIGH (ref 65–99)
Glucose-Capillary: 150 mg/dL — ABNORMAL HIGH (ref 65–99)
Glucose-Capillary: 230 mg/dL — ABNORMAL HIGH (ref 65–99)

## 2017-03-22 MED ORDER — CALCIUM CARBONATE ANTACID 500 MG PO CHEW
1.0000 | CHEWABLE_TABLET | Freq: Four times a day (QID) | ORAL | Status: DC
  Administered 2017-03-22: 200 mg via ORAL
  Filled 2017-03-22: qty 1

## 2017-03-22 MED ORDER — DELFLEX-LC/2.5% DEXTROSE 394 MOSM/L IP SOLN: INTRAPERITONEAL | Status: DC

## 2017-03-22 MED ORDER — DELFLEX-LC/2.5% DEXTROSE 394 MOSM/L IP SOLN
INTRAPERITONEAL | Status: DC | PRN
  Filled 2017-03-22: qty 5000

## 2017-03-22 MED ORDER — DARBEPOETIN ALFA 100 MCG/0.5ML IJ SOSY: 100.0000 ug | PREFILLED_SYRINGE | INTRAMUSCULAR | Status: DC

## 2017-03-22 MED ORDER — DARBEPOETIN ALFA 100 MCG/0.5ML IJ SOSY
100.0000 ug | PREFILLED_SYRINGE | INTRAMUSCULAR | Status: DC
  Filled 2017-03-22: qty 0.5

## 2017-03-22 MED ORDER — HEPARIN 1000 UNIT/ML FOR PERITONEAL DIALYSIS: 500.0000 [IU] | INTRAMUSCULAR | Status: DC | PRN

## 2017-03-22 MED ORDER — GENTAMICIN SULFATE 0.1 % EX CREA
TOPICAL_CREAM | Freq: Three times a day (TID) | CUTANEOUS | Status: DC
  Administered 2017-03-22 – 2017-03-27 (×14): via TOPICAL
  Filled 2017-03-22: qty 15

## 2017-03-22 MED ORDER — SEVELAMER CARBONATE 800 MG PO TABS
2400.0000 mg | ORAL_TABLET | Freq: Three times a day (TID) | ORAL | Status: DC
  Administered 2017-03-22 – 2017-03-27 (×14): 2400 mg via ORAL
  Filled 2017-03-22 (×15): qty 3

## 2017-03-22 MED ORDER — CALCIUM CARBONATE ANTACID 500 MG PO CHEW
1.0000 | CHEWABLE_TABLET | Freq: Four times a day (QID) | ORAL | Status: DC
  Administered 2017-03-22 – 2017-03-27 (×15): 200 mg via ORAL
  Filled 2017-03-22 (×17): qty 1

## 2017-03-22 NOTE — Evaluation (Signed)
Occupational Therapy Assessment and Plan  Patient Details  Name: Jared [name] MRN: [id number] Date of Birth: 06-06-1956  OT Diagnosis: acute pain, cognitive deficits and muscle weakness (generalized) Rehab Potential: Rehab Potential (ACUTE ONLY): Good ELOS:   ~7-10 days  Today's Date: 03/22/2017 OT Individual Time:  - 9:00-10:00 am (88mn)       Problem List:  Patient Active Problem List   Diagnosis Date Noted  . Metabolic encephalopathy 019/14/7829 . Encephalopathy   . Benign essential HTN   . Seizures ([place]   . Type 2 diabetes mellitus with peripheral neuropathy (HCC)   . History of prostate cancer   . Acute blood loss anemia   . FUO (fever of unknown origin)   . Global aphasia 03/17/2017  . Slurred speech   . Witnessed seizure-like activity ([place]   . Fever   . Weakness   . Acute encephalopathy 03/13/2017  . Prostate cancer ([place] 03/13/2017  . Hyperglycemia 03/13/2017  . Accelerated hypertension 03/13/2017  . Elevated LDL cholesterol level 01/24/2017  . Diabetes mellitus due to underlying condition, uncontrolled, without complication, with long-term current use of insulin ([place] 01/04/2017  . ESRD on dialysis ([place]   . Anemia of chronic disease   . Abdominal pain 12/08/2016  . Enlarged prostate 12/08/2016  . 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 Hypertension   . CKD (chronic kidney disease) stage V requiring chronic dialysis ([place]   . Acute respiratory failure with hypoxia ([place]   . Seizure ([facility] 11/24/2016  . Status epilepticus ([facility]     Past Medical History:  Past Medical History:  Diagnosis Date  . Anemia   . Chronic kidney disease    on dialysis M-W-F  . Diabetes mellitus without complication ([place]    onset as adult  . Hypertension   . Seizures ([place]   . Stroke ([place]   . Thyroid disease    hyperparathyroidism   Past Surgical History:  Past Surgical History:  Procedure Laterality Date  . AV FISTULA PLACEMENT Left 06/04/2014   Procedure: ARTERIOVENOUS (AV)  FISTULA CREATION-  BRACHIOCEPHALIC WITH LIGATION OF COMPETEING BRANCH;  Surgeon: JMal Misty MD;  [place]  Service: Vascular;  Laterality: Left;  . INSERTION OF DIALYSIS CATHETER  04/2014  . IR GENERIC HISTORICAL Left 11/30/2016   IR THROMBECTOMY AV FISTULA W/THROMBOLYSIS/PTA/STENT INC/SHUNT/IMG LT 11/30/2016 AMarkus Daft MD [facility]INTER[facility]  . IR GENERIC HISTORICAL  11/30/2016   IR [place] LEFT 11/30/2016 AMarkus Daft MD [facility]    Assessment & Plan Clinical Impression: Patient is a 61y.o. year old male right hand malewith history of end-stage renal disease with peritoneal dialysis, chronic cough, hypertension, diabetes mellitus, prostate cancer maintained on Lupron injections followed as outpatient and prior seizure disorder in the setting of hypoglycemia. History taken from chart review and family. Patient well known to inpatient rehabilitation services from admission 12/01/2016 for encephalopathy due to seizure. Patient lives with sister and brother-in-law. Independent prior to admission [facility] Family plans assistance as neededbut limited physically. Presented 03/13/2017 with altered mental status and slurred speech. Denied any recent trauma. Glucose 1127, lactic acid 1.35, sodium 122, potassium 5.6, creatinine 9.88, troponin negative. MRI of the brain reviewed, unremarkable for acute process. MRA unremarkable. CT of the abdomen showed moderate volume of ascites stable as compared to January 2018. EEG negative for seizure. Low-grade fever infectious disease consult with blood cultures negative etiology of syndrome of unclear in regards to fever and initially  placed on empiric antibiotics since discontinued. Neurology follow-up suspect acute encephalopathy. Follow-up renal services in regards to dialysis. Subcutaneous heparin for DVT prophylaxis. Dysphagia #2 thin liquid diet. Occupational therapy 4/24/2018With noted generalized  weakness, impulsivity and decreased safety awareness. Recommendations made for physical medicine rehabilitation consult.Patient was admitted for a comprehensive rehabilitation program  Patient transferred to CIR on 03/21/2017 .    Patient currently requires min with basic self-care skills and basic mobility  secondary to muscle weakness, decreased cardiorespiratoy endurance, impaired timing and sequencing and decreased coordination, decreased awareness, decreased problem solving, decreased safety awareness and decreased memory and decreased standing balance, decreased balance strategies and difficulty maintaining precautions.  Prior to hospitalization, patient could complete ADL with modified independent .  Patient will benefit from skilled intervention to decrease level of assist with basic self-care skills and increase independence with basic self-care skills prior to discharge home with care partner.  Anticipate patient will require 24 hour supervision and follow up home health.  OT - End of Session Activity Tolerance: Tolerates 30+ min activity with multiple rests Endurance Deficit: Yes OT Assessment Rehab Potential (ACUTE ONLY): Good Barriers to Discharge: Decreased caregiver support OT Patient demonstrates impairments in the following area(s): Balance;Safety;Sensory;Cognition;Endurance;Motor;Perception OT Basic ADL's Functional Problem(s): Eating;Grooming;Bathing;Dressing;Toileting OT Transfers Functional Problem(s): Toilet;Tub/Shower OT Additional Impairment(s): Fuctional Use of Upper Extremity OT Plan OT Intensity: Minimum of 1-2 x/day, 45 to 90 minutes OT Frequency: 5 out of 7 days OT Treatment/Interventions: Balance/vestibular training;Discharge planning;Pain management;Self Care/advanced ADL retraining;Therapeutic Activities;UE/LE Coordination activities;Visual/perceptual remediation/compensation;Therapeutic Exercise;Skin care/wound managment;Patient/family education;Functional mobility  training;Cognitive remediation/compensation;Disease mangement/prevention;Community reintegration;DME/adaptive equipment instruction;Neuromuscular re-education;Psychosocial support;UE/LE Strength taining/ROM OT Recommendation Recommendations for Other Services: Neuropsych consult Patient destination: Home Follow Up Recommendations: Home health OT Equipment Recommended: To be determined   Skilled Therapeutic Intervention Ot eval initiated with OT purpose, role and goals discussed. Self care retraining including bed mobility, functional mobility around the room without a RW, bathing at shower level and dressing sit to stand. Pt presents with decr immediate recall and decr short term recall. Pt also c/o discomfort in right UE/LE as well as incoordination. Pt did demonstrate this incoordination in functional tasks; he frequently dropped tools from right hand (in shower and at the sink). Pt able to dress from EOB sit to stand with steadying A. Pt does take rest breaks throughout session. During functional ambulation pt with decr weight shift and weight bearing through right LE.   Pt does report driving at home even though MD had not recommended at this time. Pt's bedroom is on 2nd floor of home.  Goals are for mod I for tasks in a supervised environment due to cognition for safety.     OT Evaluation Precautions/Restrictions  Precautions Precautions: Fall Precaution Comments: PD with port Restrictions Weight Bearing Restrictions: No General Chart Reviewed: Yes Family/Caregiver Present: No Vital Signs Therapy Vitals Temp: 98.4 F (36.9 C) Temp Source: Oral Pulse Rate: 93 Resp: 18 BP: (!) 160/83 Patient Position (if appropriate): Lying Pain Pain Assessment Pain Assessment: No/denies pain Pain Score: (P) 0-No pain Home Living/Prior Functioning Home Living Available Help at Discharge: Family, Available 24 hours/day Type of Home: House Home Access: Stairs to enter Engineer, site of Steps: 2 Home Layout: Two level, Bed/bath upstairs Alternate Level Stairs-Number of Steps: 1 flight Alternate Level Stairs-Rails: Left, Right Bathroom Shower/Tub: Multimedia programmer: Standard  Lives With: Family ADL ADL ADL Comments: see functional navigator Vision/Perception  Vision- Assessment Eye Alignment: Within Functional Limits Perception Perception: Impaired Praxis Praxis: Impaired Praxis Impairment Details:  Motor planning  Cognition Overall Cognitive Status: Impaired/Different from baseline Arousal/Alertness: Awake/alert Orientation Level: Person;Place;Situation Person: Oriented Place: Oriented Situation: Oriented Year: 2017 Month: April (said may at first then corrected self) Day of Week: Incorrect (Wednesday) Memory: Impaired Memory Impairment: Decreased recall of new information Immediate Memory Recall: Sock;Blue;Bed Memory Recall:  (0/3) Attention: Sustained Focused Attention: Appears intact Sustained Attention: Appears intact Awareness: Impaired Awareness Impairment: Intellectual impairment;Emergent impairment Problem Solving: Impaired Problem Solving Impairment: Functional basic Executive Function: Decision Making Decision Making: Impaired Safety/Judgment: Impaired Sensation Sensation Light Touch: Appears Intact Stereognosis: Appears Intact Hot/Cold: Appears Intact Proprioception: Impaired Detail Proprioception Impaired Details: Impaired RUE Coordination Gross Motor Movements are Fluid and Coordinated: No Fine Motor Movements are Fluid and Coordinated: Yes Coordination and Movement Description: decr coordination in righ thand with functional tasks but able to tie shoe with extra time Motor  Motor Motor - Skilled Clinical Observations: generalized weakness (right side especially weaker overall) Mobility  Transfers Transfers: Sit to Stand;Stand to Sit Sit to Stand: 4: Min assist Stand to Sit: 4: Min assist   Trunk/Postural Assessment  Cervical Assessment Cervical Assessment: Within Functional Limits Thoracic Assessment Thoracic Assessment: Within Functional Limits Lumbar Assessment Lumbar Assessment:  (posterior pelvic tilt) Postural Control Postural Control: Deficits on evaluation Righting Reactions: delayed  Balance Balance Balance Assessed: Yes Dynamic Sitting Balance Dynamic Sitting - Balance Support: During functional activity;No upper extremity supported Dynamic Sitting - Level of Assistance: 5: Stand by assistance Sitting balance - Comments: Able to maintain sitting balance with min guard.  Static Standing Balance Static Standing - Balance Support: During functional activity;No upper extremity supported Static Standing - Level of Assistance: 4: Min assist Dynamic Standing Balance Dynamic Standing - Balance Support: No upper extremity supported;During functional activity Dynamic Standing - Level of Assistance: 4: Min assist;3: Mod assist Dynamic Standing - Comments: decr ability to fully weight bear through right LE (pt reports some discomfort in right thigh Extremity/Trunk Assessment RUE Assessment RUE Assessment: Exceptions to [facility] RUE AROM (degrees) RUE Overall AROM Comments: WFL RUE Strength RUE Overall Strength Comments: decr sensation- dropping items RUE Tone RUE Tone: Within Functional Limits LUE Assessment LUE Assessment: Within Functional Limits   See Function Navigator for Current Functional Status.   Refer to Care Plan for Long Term Goals  Recommendations for other services: Neuropsych   Discharge Criteria: Patient will be discharged from OT if patient refuses treatment 3 consecutive times without medical reason, if treatment goals not met, if there is a change in medical status, if patient makes no progress towards goals or if patient is discharged from hospital.  The above assessment, treatment plan, treatment alternatives and goals were discussed and  mutually agreed upon: by patient  Nicoletta Ba 03/22/2017, 9:52 AM

## 2017-03-22 NOTE — Evaluation (Addendum)
Physical Therapy Assessment and Plan  Patient Details  Name: Jared [name] MRN: [id number] Date of Birth: Mar 24, 1956  PT Diagnosis: Abnormality of gait, Cognitive deficits and Hemiparesis dominant Rehab Potential: Good ELOS: 7-10   Today's Date: 03/22/2017 PT Individual Time: 1400-1500 ; 4098-1191 PT Individual Time Calculation (min): 60 min  , 15 min Problem List:  Patient Active Problem List   Diagnosis Date Noted  . Metabolic encephalopathy [id number]/9562  . Encephalopathy   . Benign essential HTN   . Seizures ([place])   . Type 2 diabetes mellitus with peripheral neuropathy (HCC)   . History of prostate cancer   . Acute blood loss anemia   . FUO (fever of unknown origin)   . Global aphasia 03/17/2017  . Slurred speech   . Witnessed seizure-like activity ([place])   . Fever   . Weakness   . Acute encephalopathy 03/13/2017  . Prostate cancer ([place]) 03/13/2017  . Hyperglycemia 03/13/2017  . Accelerated hypertension 03/13/2017  . Elevated LDL cholesterol level 01/24/2017  . Diabetes mellitus due to underlying condition, uncontrolled, without complication, with long-term current use of insulin ([place]) 01/04/2017  . ESRD on dialysis ([place])   . Anemia of chronic disease   . Abdominal pain 12/08/2016  . Enlarged prostate 12/08/2016  . 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 Hypertension   . CKD (chronic kidney disease) stage V requiring chronic dialysis ([place])   . Acute respiratory failure with hypoxia ([place])   . Seizure ([place]) 11/24/2016  . Status epilepticus [facility])     Past Medical History:  Past Medical History:  Diagnosis Date  . Anemia   . Chronic kidney disease    on dialysis M-W-F  . Diabetes mellitus without complication ([place])    onset as adult  . Hypertension   . Seizures ([place])   . Stroke ([place])   . Thyroid disease    hyperparathyroidism   Past Surgical History:  Past Surgical History:  Procedure Laterality Date  . AV FISTULA PLACEMENT Left 06/04/2014   Procedure: ARTERIOVENOUS  (AV) FISTULA CREATION-  BRACHIOCEPHALIC WITH LIGATION OF COMPETEING BRANCH;  Surgeon: Mal Misty, MD;  Location: [facility];  Service: Vascular;  Laterality: Left;  . INSERTION OF DIALYSIS CATHETER  04/2014  . IR GENERIC HISTORICAL Left 11/30/2016   IR THROMBECTOMY AV FISTULA W/THROMBOLYSIS/PTA/STENT INC/SHUNT/IMG LT 11/30/2016 Jared Daft, MD [facility]  . IR GENERIC HISTORICAL  11/30/2016   IR US GUIDE VASC ACCESS LEFT 11/30/2016 Jared Daft, MD [facility]    Assessment & Plan Clinical Impression: Jared [name] a 61 y.o.right hand malewith history of end-stage renal disease with peritoneal dialysis, chronic cough, hypertension, diabetes mellitus, prostate cancer maintained on Lupron injections followed as outpatient and prior seizure disorder in the setting of hypoglycemia. History taken from chart review and family. Patient well known to inpatient rehabilitation services from admission 12/01/2016 for encephalopathy due to seizure. Patient lives with sister and brother-in-law. Independent prior to admission he does his own peritoneal dialysis at [facility] drives. Family plans assistance as neededbut limited physically. Presented 03/13/2017 with altered mental status and slurred speech. Denied any recent trauma. Glucose 1127, lactic acid 1.35, sodium 122, potassium 5.6, creatinine 9.88, troponin negative. MRI of the brain reviewed, unremarkable for acute process. MRA unremarkable. CT of the abdomen showed moderate volume of ascites stable as compared to January 2018. EEG negative for seizure. Low-grade fever infectious disease consult with blood cultures negative etiology of syndrome of unclear in regards to fever and initially placed on empiric antibiotics since discontinued. Neurology  follow-up suspect acute encephalopathy. Patient transferred to CIR on 03/21/2017 .   Patient currently requires mod with mobility secondary to decreased cardiorespiratoy endurance, impaired timing and sequencing and  unbalanced muscle activation, decreased problem solving, decreased safety awareness and decreased memory and decreased sitting balance, decreased standing balance, hemiplegia and decreased balance strategies.  Prior to hospitalization, patient was modified independent  with mobility and lived with Family in a House home.  Home access is 2Stairs to enter, no rails, and 15 steps with bil rails to reach 2nd floor, where pt's full BR is.  Pt stated there is a 1/2 bath on entry level.  Patient will benefit from skilled PT intervention to maximize safe functional mobility, minimize fall risk and decrease caregiver burden for planned discharge home with intermittent assist.  Anticipate patient will benefit from follow up [facility] at discharge.  PT - End of Session Activity Tolerance: Tolerates 10 - 20 min activity with multiple rests Endurance Deficit: Yes Endurance Deficit Description: fatigued after ambualting 150' PT Assessment Rehab Potential (ACUTE/IP ONLY): Good Barriers to Discharge: Decreased caregiver support;Inaccessible home environment PT Patient demonstrates impairments in the following area(s): Balance;Behavior;Endurance;Motor;Safety PT Transfers Functional Problem(s): Bed Mobility;Bed to Chair;Car;Furniture;Floor PT Locomotion Functional Problem(s): Ambulation;Wheelchair Mobility;Stairs PT Plan PT Intensity: Minimum of 1-2 x/day ,45 to 90 minutes PT Frequency: 5 out of 7 days PT Duration Estimated Length of Stay: 7-10 PT Treatment/Interventions: Ambulation/gait training;Balance/vestibular training;Cognitive remediation/compensation;Discharge planning;Community reintegration;DME/adaptive equipment instruction;Functional electrical stimulation;Functional mobility training;Patient/family education;Pain management;Neuromuscular re-education;Psychosocial support;Splinting/orthotics;Therapeutic Exercise;Therapeutic Activities;Stair training;UE/LE Strength taining/ROM;UE/LE Coordination  activities;Wheelchair propulsion/positioning PT Transfers Anticipated Outcome(s): modified independent basic, supervision car PT Locomotion Anticipated Outcome(s): modified independent gait x 50' household, supervision x 150' controlled and community env, supervision 2 steps no rails to enter home, and 12 steps 2 rails to access 2nd floor PT Recommendation Recommendations for Other Services: Neuropsych consult Follow Up Recommendations: Home health PT Patient destination: Home Equipment Recommended: To be determined  Skilled Therapeutic Intervention tx 1:   Pt with LOB upon standing at EOB, regained with bil LEs pressed against bed, and demonstrated poor eccentric control when sitting.  Pt unable to state what he would do if he needed to use BR.  When presented call bell, he said, "oh yeah, call". Pt had no idea why he was here, but did remember parts of his recent stay at CIR.   He  snapped his fingers and swagger when ambulating with PT.  He stated his sister is retired and stays at home when he is home; PT questions accuracy.  He was unable to give a clear explanation of when and if he wears eyeglasses.  Pt left resting in w/c with quick release belt applied and all needs within reach. PT informed Erline Levine, RN that pt c/o heartburn.  tx 2: neuromuscular re-education via demo, multimodal cues for seated alternating R/L ankle pumps, R/L long arc quad knee ext, bil glut sets; use of R hand to hold small watering can to fill, and water plants in standing. Pt impulsive when attempting to water house plants ,insisting he use R hand, and with significant spillage. Pt appeared resentful that PT requested him to do this. Pt left resting in w/c with quick release belt applied and all needs within reach.  PT Evaluation Precautions/Restrictions Precautions Precautions: Fall Precaution Comments: PD with port Restrictions Weight Bearing Restrictions: No General   Vital SignsTherapy Vitals Temp: 98.7 F  (37.1 C) Temp Source: Oral Pulse Rate: 84 Resp: 17 BP: 140/70 Patient Position (if appropriate): Lying Oxygen Therapy  SpO2: 95 % O2 Device: Not Delivered Pain Pain Assessment Pain Assessment: No/denies pain Home Living/Prior Functioning Home Living Available Help at Discharge: Family;Available 24 hours/day Type of Home: House Home Access: Stairs to enter [employer] of Steps: 2 Entrance Stairs-Rails: None Home Layout: Two level;Bed/bath upstairs (pt states master full BR on gound level, plus 1/2 bath; his BR is upstairs) Alternate Level Stairs-Number of Steps: 1 flight Alternate Level Stairs-Rails: Left;Right Bathroom Shower/Tub: Engineer, mining: Yes  Lives With: Family Prior Function Level of Independence: Independent with homemaking with ambulation  Able to Take Stairs?: Yes Driving: Yes Vocation: Retired Leisure: Hobbies-yes (Comment) Comments: Works periodically transporting cars; 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 watches football on TV Vision/Perception  Vision - History Baseline Vision: Bifocals (poor historian regarding vision) Patient Visual Report: No change from baseline Vision - Assessment Eye Alignment: Within Functional Limits  Cognition Overall Cognitive Status: Impaired/Different from baseline Orientation Level: Oriented to person;Oriented to place;Disoriented to situation;Disoriented to time Attention: Sustained Focused Attention: Appears intact Memory: Impaired Memory Impairment: Decreased recall of new information 3 word test: pt remembered 1/3 after 10 min Safety/Judgment: Impaired Sensation Sensation Light Touch: Appears Intact Proprioception: Appears Intact Coordination Gross Motor Movements are Fluid and Coordinated: Yes Fine Motor Movements are Fluid and Coordinated: Yes Heel Shin Test: WNLS, = bil Motor  Motor Motor: Within Functional Limits Motor - Skilled Clinical  Observations: pt describes muscle spasms RLE, espcially at night  Mobility Bed Mobility Bed Mobility: Rolling Right;Rolling Left;Right Sidelying to Sit Rolling Right: 5: Supervision Rolling Right Details: Verbal cues for precautions/safety Rolling Left: 5: Supervision Rolling Left Details: Verbal cues for precautions/safety Right Sidelying to Sit: 5: Supervision;With rails;HOB flat Transfers Transfers: Yes Sit to Stand: 5: Supervision;With armrests Sit to Stand Details: Verbal cues for precautions/safety Sit to Stand Details (indicate cue type and reason):  mild LOB backwards, regained independnently Stand to Sit: 3: Mod assist;With armrests Stand to Sit Details (indicate cue type and reason): Manual facilitation for placement (poor eccentric control) Stand to Sit Details: poor eccentric control Stand Pivot Transfers: 3: Mod assist Stand Pivot Transfer Details: Manual facilitation for weight shifting Locomotion  Ambulation Ambulation: Yes Ambulation/Gait Assistance: 4: Min assist Ambulation Distance (Feet): 150 Feet Assistive device: None Ambulation/Gait Assistance Details: Manual facilitation for weight shifting;Verbal cues for precautions/safety Gait Gait: Yes Gait Pattern: Narrow base of support;Decreased trunk rotation;Trendelenburg;Step-through pattern Gait velocity: decreased Stairs / Additional Locomotion Stairs: Yes Stairs Assistance: 4: Min assist Stairs Assistance Details: Manual facilitation for weight shifting Stair Management Technique: Two rails Number of Stairs: 12 Height of Stairs: 3 (and 6) Product manager Mobility: Yes Wheelchair Assistance: 4: Advertising account executive Details: Multimedia programmer for Horticulturist, commercial: Both upper extremities Wheelchair Parts Management: Needs assistance Distance: 150  Trunk/Postural Assessment  Cervical Assessment Cervical Assessment: Exceptions to [facility] Cervical AROM Overall  Cervical AROM Comments: limited extension Thoracic Assessment Thoracic Assessment: Within Functional Limits Lumbar Assessment Lumbar Assessment: Exceptions to [facility] (posterior pelvic tilt) Postural Control Postural Control: Deficits on evaluation Righting Reactions: delayed   Balance Balance Balance Assessed: Yes Dynamic Sitting Balance Dynamic Sitting - Balance Support: During functional activity;No upper extremity supported Dynamic Sitting - Level of Assistance: 5: Stand by assistance Dynamic Sitting - Balance Activities: Lateral lean/weight shifting;Forward lean/weight shifting;Reaching across midline Static Standing Balance Static Standing - Balance Support: No upper extremity supported Static Standing - Level of Assistance: 4: Min assist Dynamic Standing Balance Dynamic Standing - Balance Support: No upper extremity supported;During functional  activity Dynamic Standing - Level of Assistance: 3: Mod assist;4: Min assist Dynamic Standing - Comments: turning to sit; immedicatley after standing Extremity Assessment      RLE Assessment RLE Assessment: Within Functional Limits (hip flex, knee ext, ankle DF tested in sitting) LLE Assessment LLE Assessment: Within Functional Limits (hip flex, knee ext, ankle DF tested in sitting)   See Function Navigator for Current Functional Status.   Refer to Care Plan for Long Term Goals  Recommendations for other services: None   Discharge Criteria: Patient will be discharged from PT if patient refuses treatment 3 consecutive times without medical reason, if treatment goals not met, if there is a change in medical status, if patient makes no progress towards goals or if patient is discharged from hospital.  The above assessment, treatment plan, treatment alternatives and goals were discussed and mutually agreed upon: by patient  Heylee Tant 03/22/2017, 3:29 PM

## 2017-03-22 NOTE — Progress Notes (Signed)
[facility] PHYSICAL MEDICINE & REHABILITATION     PROGRESS NOTE    Subjective/Complaints: A little restless last night. Was able to sleep some. Trying to catch a few more minutes when I saw him this morning.   ROS: Limited due cognitive/behavioral   Objective: Vital Signs: Blood pressure (!) 160/83, pulse 93, temperature 98.4 F (36.9 C), temperature source Oral, resp. rate 18, height 5' 9.6" (1.768 m), weight 76.5 kg (168 lb 10.4 oz), SpO2 97 %. No results found.  Recent Labs  03/20/17 0341 03/21/17 0740  WBC 7.5 6.2  HGB 8.7* 8.4*  HCT 26.3* 24.6*  PLT 252 284    Recent Labs  03/20/17 0341 03/21/17 0740  NA 132* 129*  K 4.1 3.8  CL 93* 92*  GLUCOSE 239* 321*  BUN 46* 42*  CREATININE 9.29* 9.18*  CALCIUM 8.0* 7.8*   CBG (last 3)   Recent Labs  03/21/17 1629 03/21/17 2118 03/22/17 0653  GLUCAP 148* 159* 132*    Wt Readings from Last 3 Encounters:  03/22/17 76.5 kg (168 lb 10.4 oz)  03/20/17 76.2 kg (167 lb 15.9 oz)  03/14/17 77.1 kg (170 lb)    Physical Exam:  Constitutional: He appears well-developed and well-nourished.  HENT:  Head: Normocephalic and atraumatic.  Eyes: PERRL Neck: Normal range of motion. Neck supple. No thyromegaly present.  Cardiovascular: RRR  Respiratory: CTA B. Normal effort GI: Soft. Bowel sounds are normal. He exhibits no distension. There is no tenderness.  Musculoskeletal: He exhibits no edema or tenderness.  Neurological: He is alert.  Easily aroused.   A&Ox2 Followed simple commands.  Motor: B/l UE 4/5 proximal to distal B/l LE: 4+/5 proximal to distal  Skin: Skin is warm and dry.  Psychiatric: His affect is blunt. His speech is delayed. He is slowed  Assessment/Plan: 1. Functional and cognitive deficits secondary to encephalopathy/seizure which require 3+ hours per day of interdisciplinary therapy in a comprehensive inpatient rehab setting. Physiatrist is providing close team supervision and 24 hour management  of active medical problems listed below. Physiatrist and rehab team continue to assess barriers to discharge/monitor patient progress toward functional and medical goals.  Function:  Bathing Bathing position      Bathing parts      Bathing assist        Upper Body Dressing/Undressing Upper body dressing                    Upper body assist        Lower Body Dressing/Undressing Lower body dressing                                  Lower body assist        Toileting Toileting          Toileting assist     Transfers Chair/bed transfer             Locomotion Ambulation           Wheelchair          Cognition Comprehension    Expression    Social Interaction    Problem Solving    Memory     Medical Problem List and Plan: 1.  Decreased functional mobility, slurred speech secondary to metabolic encephalopathy/fever/ hyperglycemia  -beginning therapies today 2.  DVT Prophylaxis/Anticoagulation: Subcutaneous heparin. Monitor platelet counts of any signs of bleeding 3. Pain Management: Tylenol as needed 4.  Mood: Ativan as needed 5. Neuropsych: This patient is capable of making decisions on his own behalf. 6. Skin/Wound Care: Routine skin checks 7. Fluids/Electrolytes/Nutrition: Routine I&O with follow-up chemistries per renal/according to PD requirements 8. ESRD. Hemodialysis as per renal services 9. Hypertension. Hydralazine 100 mg 3 times a day, Lopressor 100 mg twice a day, Imdur 15 mg daily, nifedipine 90 mg daily. Monitor with increased mobility 10. Fever. Blood cultures negative. Follow-up infectious disease. Empiric antibiotics discontinued  -afebrile 11. Seizure disorder. Keppra 250 mg twice a day 12. Diabetes mellitus and peripheral neuropathy. Hemoglobin A1c 8.5. Lantus insulin 8 units twice a day. Checking blood sugars before meals and at bedtime. Diabetic teaching 13. History of prostate cancer. Follow-up outpatient Lupron  injections with urology services. 14. Acute on chronic anemia. Aranesp   LOS (Days) 1 A FACE TO FACE EVALUATION WAS PERFORMED  [name], MD 03/22/2017 10:37 AM

## 2017-03-22 NOTE — Plan of Care (Signed)
Problem: RH SKIN INTEGRITY Goal: RH STG SKIN FREE OF INFECTION/BREAKDOWN Outcome: Progressing No issues Goal: RH OTHER STG SKIN INTEGRITY GOALS W/ASSIST Other STG Skin Integrity Goals With Assistance.  Outcome: Progressing No skin issues noted  Problem: RH SAFETY Goal: RH STG ADHERE TO SAFETY PRECAUTIONS W/ASSISTANCE/DEVICE STG Adhere to Safety Precautions With Assistance/Device.  Outcome: Progressing Safety precautions maintained, fall prevention mainatined  Problem: RH PAIN MANAGEMENT Goal: RH STG PAIN MANAGED AT OR BELOW PT'S PAIN GOAL Outcome: Progressing Denies pain

## 2017-03-22 NOTE — Progress Notes (Signed)
Patient information reviewed and entered into eRehab system by Roxas Clymer, RN, CRRN, PPS Coordinator.  Information including medical coding and functional independence measure will be reviewed and updated through discharge.     Per nursing patient was given "Data Collection Information Summary for Patients in Inpatient Rehabilitation Facilities with attached "Privacy Act Statement-Health Care Records" upon admission.  

## 2017-03-22 NOTE — Evaluation (Signed)
Speech Language Pathology Assessment and Plan  Patient Details  Name: Jared [name] MRN: [id number] Date of Birth: 31-Aug-1956  SLP Diagnosis: Cognitive Impairments;Speech and Language deficits;Dysphagia  Rehab Potential: Good ELOS: 7-10 days     Today's Date: 03/22/2017 SLP Individual Time: 1000-1055 SLP Individual Time Calculation (min): 55 min   Problem List:  Patient Active Problem List   Diagnosis Date Noted  . Metabolic encephalopathy 50/35/4656  . Encephalopathy   . Benign essential HTN   . Seizures ([place])   . Type 2 diabetes mellitus with peripheral neuropathy (HCC)   . History of prostate cancer   . Acute blood loss anemia   . FUO (fever of unknown origin)   . Global aphasia 03/17/2017  . Slurred speech   . Witnessed seizure-like activity ([place])   . Fever   . Weakness   . Acute encephalopathy 03/13/2017  . Prostate cancer ([place]) 03/13/2017  . Hyperglycemia 03/13/2017  . Accelerated hypertension 03/13/2017  . Elevated LDL cholesterol level 01/24/2017  . Diabetes mellitus due to underlying condition, uncontrolled, without complication, with long-term current use of insulin ([place]) 01/04/2017  . ESRD on dialysis ([place])   . Anemia of chronic disease   . Abdominal pain 12/08/2016  . Enlarged prostate 12/08/2016  . 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 Hypertension   . CKD (chronic kidney disease) stage V requiring chronic dialysis ([place])   . Acute respiratory failure with hypoxia ([place])   . Seizure ([place]) 11/24/2016  . Status epilepticus [facility])    Past Medical History:  Past Medical History:  Diagnosis Date  . Anemia   . Chronic kidney disease    on dialysis M-W-F  . Diabetes mellitus without complication ([place])    onset as adult  . Hypertension   . Seizures ([place])   . Stroke ([place])   . Thyroid disease    hyperparathyroidism   Past Surgical History:  Past Surgical History:  Procedure Laterality Date  . AV FISTULA PLACEMENT Left 06/04/2014   Procedure: ARTERIOVENOUS (AV)  FISTULA CREATION-  BRACHIOCEPHALIC WITH LIGATION OF COMPETEING BRANCH;  Surgeon: Mal Misty, MD;  Location: [facility];  Service: Vascular;  Laterality: Left;  . INSERTION OF DIALYSIS CATHETER  04/2014  . IR GENERIC HISTORICAL Left 11/30/2016   IR THROMBECTOMY AV FISTULA W/THROMBOLYSIS/PTA/STENT INC/SHUNT/IMG LT 11/30/2016 Markus Daft, MD [facility]-INTERV RAD  . IR GENERIC HISTORICAL  11/30/2016   IR US GUIDE VASC ACCESS LEFT 11/30/2016 Markus Daft, MD [facility]INTER[facility]    Assessment / Plan / Recommendation Clinical Impression Patient is a 61 y.o. right hand male with history of end-stage renal disease with peritoneal dialysis, chronic cough, hypertension, diabetes mellitus, prostate cancer maintained on Lupron injections followed as outpatient and prior seizure disorder in the setting of hypoglycemia. History taken from chart review and family. Patient well known to inpatient rehabilitation services from admission 12/01/2016 for encephalopathy due to seizure. Patient lives with sister and brother-in-law. Independent prior to admission he does his own peritoneal dialysis at home and drives. Family plans assistance as needed but limited physically. Presented 03/13/2017 with altered mental status and slurred speech. Denied any recent trauma. Glucose 1127, lactic acid 1.35, sodium 122, potassium 5.6, creatinine 9.88, troponin negative. MRI of the brain reviewed, unremarkable for acute process. MRA unremarkable. CT of the abdomen showed moderate volume of ascites stable as compared to January 2018. EEG negative for seizure. Low-grade fever infectious disease consult with blood cultures negative etiology of syndrome of unclear in regards to fever and initially placed on empiric antibiotics  since discontinued. Neurology follow-up suspect acute encephalopathy. Follow-up renal services in regards to dialysis. Subcutaneous heparin for DVT prophylaxis. Dysphagia #2 thin liquid diet. Occupational therapy 03/20/2017 With noted generalized  weakness, impulsivity and decreased safety awareness. Recommendations made for physical medicine rehabilitation consult.Patient was admitted for a comprehensive rehabilitation program on 03/21/17.  Upon arrival, patient with consistent dry cough and subtle throat clearing that continued throughout session. Patient reported he felt like he had "heartburn" and that he has been avoiding certain foods prior to hospitalization due to reflux. Patient consumed thin liquids without overt s/s of aspiration and demonstrated what appeared to be a timely swallow initiation. Patient also demonstrated efficient mastication with Dys. 2 textures and requested to stay on current textures due to current diet "going well right now," therefore, recommend patient continue current diet. SLP will f/u briefly for tolerance. Patient demonstrated mild language impairments characterized by verbal perseveration with decreased awareness of errors and difficulty following 1 and 2 step commands. Patient also demonstrated mild-moderate cognitive impairments impacting problem solving, awareness and recall with functional and familiar tasks. Patient would benefit from skilled SLP intervention to maximize his cognitive-linguistic function and overall functional independence prior to discharge.    Skilled Therapeutic Interventions          Administered a BSE and cognitive-linguistic evaluation. Please see above for details.   SLP Assessment  Patient will need skilled Speech Lanaguage Pathology Services during CIR admission    Recommendations  SLP Diet Recommendations: Dysphagia 2 (Fine chop);Thin Liquid Administration via: Cup;Straw Medication Administration: Whole meds with liquid Supervision: Patient able to self feed;Intermittent supervision to cue for compensatory strategies Compensations: Minimize environmental distractions;Slow rate;Small sips/bites Postural Changes and/or Swallow Maneuvers: Seated upright 90 degrees;Upright 30-60  min after meal Oral Care Recommendations: Oral care BID Recommendations for Other Services: Neuropsych consult Patient destination: Home Follow up Recommendations: 24 hour supervision/assistance;Home Health SLP Equipment Recommended: None recommended by SLP    SLP Frequency 3 to 5 out of 7 days   SLP Duration  SLP Intensity  SLP Treatment/Interventions 7-10 days   Minumum of 1-2 x/day, 30 to 90 minutes  Cognitive remediation/compensation;Cueing hierarchy;Functional tasks;Patient/family education;Environmental controls;Dysphagia/aspiration precaution training;Internal/external aids;Speech/Language facilitation;Therapeutic Activities    Pain Pain Assessment Pain Assessment: No/denies pain  Prior Functioning Type of Home: House  Lives With: Family Available Help at Discharge: Family;Available 24 hours/day Vocation: Retired  Function:  Eating Eating   Modified Consistency Diet: Yes Eating Assist Level: Set up assist for   Eating Set Up Assist For: Opening containers       Cognition Comprehension Comprehension assist level: Understands basic 90% of the time/cues < 10% of the time  Expression   Expression assist level: Expresses basic 90% of the time/requires cueing < 10% of the time.  Social Interaction Social Interaction assist level: Interacts appropriately 90% of the time - Needs monitoring or encouragement for participation or interaction.  Problem Solving Problem solving assist level: Solves basic 50 - 74% of the time/requires cueing 25 - 49% of the time  Memory Memory assist level: Recognizes or recalls 25 - 49% of the time/requires cueing 50 - 75% of the time   Short Term Goals: Week 1: SLP Short Term Goal 1 (Week 1): Patient will consume current diet with minimal overt s/s of aspiration with Mod I.  SLP Short Term Goal 2 (Week 1): Patient will recall new, daily information with Mod A verbal and visual cues. SLP Short Term Goal 3 (Week 1): Patient will demonstrate  functional problem  solving for basic and familiar tasks with Min A verbal cues.  SLP Short Term Goal 4 (Week 1): Patient will self-monitor and correct verbal errors during informal and structured tasks with supervision verbal cues.  SLP Short Term Goal 5 (Week 1): Patient will follow multi-step commands with Min A verbal and visual cues.   Refer to Care Plan for Long Term Goals  Recommendations for other services: Neuropsych  Discharge Criteria: Patient will be discharged from SLP if patient refuses treatment 3 consecutive times without medical reason, if treatment goals not met, if there is a change in medical status, if patient makes no progress towards goals or if patient is discharged from hospital.  The above assessment, treatment plan, treatment alternatives and goals were discussed and mutually agreed upon: by patient  Jared [name] 03/22/2017, 3:47 PM

## 2017-03-22 NOTE — Progress Notes (Signed)
Patient ID: Jared [name], male   DOB: 1956-09-17, 61 y.o.   MRN: [id number]  [facility] Progress Note   Assessment/ Plan:   1. Fever/altered mental status: Likely metabolic encephalopathy from what appears to be a viral encephalitis. Now mentating back at baseline. In CIR for deconditioning following acute illness.  2. ESRD: Continue cyclic peritoneal dialysis at the current prescription with use of 2.5% Dianeal to try and gain better blood pressure control. Euvolemic on exam and without concerning signs or symptoms.  3. Anemia: His hemoglobin levels remain low without any overt loss, will give ESA today 4. CKD-MBD: Elevated phosphorus levels, start sevelamer 5. Hypertension: Fair BP with intermittent elevations, will monitor for now and reassess medications in the next 24hrs.  Subjective:   Reports feeling well at this time and looking forward to PT/OT in a few minutes. PD uneventful overnight.  Objective:   BP (!) 160/83 (BP Location: Right Arm)   Pulse 93   Temp 98.4 F (36.9 C) (Oral)   Resp 18   Ht 5' 9.6" (1.768 m)   Wt 76.5 kg (168 lb 10.4 oz)   SpO2 97%   BMI 24.48 kg/m   Physical Exam: Gen: Resting comfortably in recliner CVS: Pulse regular rhythm, normal rate Resp: Clear to auscultation, no rales Abd: Soft, flat, PD catheter in situ Ext: No lower extremity edema  Labs: BMET  Recent Labs Lab 03/16/17 0232 03/16/17 1624 03/17/17 0824 03/18/17 0230 03/20/17 0341 03/21/17 0740  NA 133* 138 138 136 132* 129*  K 4.6 4.2 4.0 3.8 4.1 3.8  CL 96* 100* 99* 98* 93* 92*  CO2 19* 22 23 24 26 25   GLUCOSE 322* 105* 238* 77 239* 321*  BUN 75* 68* 55* 51* 46* 42*  CREATININE 12.21* 12.14* 10.85* 10.39* 9.29* 9.18*  CALCIUM 8.3* 8.2* 8.3* 8.0* 8.0* 7.8*  PHOS 7.6*  --  7.1* 7.7* 8.0* 6.6*   CBC  Recent Labs Lab 03/16/17 1624 03/17/17 0824 03/18/17 0229 03/19/17 0250 03/20/17 0341 03/21/17 0740  WBC 6.7 6.4 7.1 7.4 7.5 6.2  NEUTROABS 5.1 4.5   --  5.7 6.0  --   HGB 9.1* 9.3* 8.3* 8.9* 8.7* 8.4*  HCT 26.5* 27.8* 25.4* 26.8* 26.3* 24.6*  MCV 80.8 82.2 81.4 81.0 80.7 81.2  PLT 172 165 197 201 252 284   Medications:    . gentamicin cream   Topical TID  . heparin  5,000 Units Subcutaneous Q8H  . hydrALAZINE  100 mg Oral TID  . insulin aspart  0-9 Units Subcutaneous TID WC  . insulin glargine  8 Units Subcutaneous BID  . isosorbide mononitrate  15 mg Oral Daily  . levETIRAcetam  250 mg Oral BID  . metoprolol tartrate  100 mg Oral BID  . NIFEdipine  90 mg Oral Daily  . pantoprazole  40 mg Oral Q0600   Elmarie Shiley, MD 03/22/2017, 10:23 AM

## 2017-03-23 ENCOUNTER — Inpatient Hospital Stay (HOSPITAL_COMMUNITY): Payer: Medicare Other | Admitting: Physical Therapy

## 2017-03-23 ENCOUNTER — Inpatient Hospital Stay (HOSPITAL_COMMUNITY): Payer: Medicare Other | Admitting: Occupational Therapy

## 2017-03-23 ENCOUNTER — Inpatient Hospital Stay (HOSPITAL_COMMUNITY): Payer: Medicare Other | Admitting: Speech Pathology

## 2017-03-23 DIAGNOSIS — R05 Cough: Secondary | ICD-10-CM

## 2017-03-23 DIAGNOSIS — Z992 Dependence on renal dialysis: Secondary | ICD-10-CM

## 2017-03-23 DIAGNOSIS — N186 End stage renal disease: Secondary | ICD-10-CM

## 2017-03-23 DIAGNOSIS — I808 Phlebitis and thrombophlebitis of other sites: Secondary | ICD-10-CM

## 2017-03-23 LAB — GLUCOSE, CAPILLARY
GLUCOSE-CAPILLARY: 45 mg/dL — AB (ref 65–99)
GLUCOSE-CAPILLARY: 52 mg/dL — AB (ref 65–99)
GLUCOSE-CAPILLARY: 53 mg/dL — AB (ref 65–99)
GLUCOSE-CAPILLARY: 75 mg/dL (ref 65–99)
Glucose-Capillary: 415 mg/dL — ABNORMAL HIGH (ref 65–99)
Glucose-Capillary: 419 mg/dL — ABNORMAL HIGH (ref 65–99)
Glucose-Capillary: 261 mg/dL — ABNORMAL HIGH (ref 65–99)
Glucose-Capillary: 98 mg/dL (ref 65–99)
Glucose-Capillary: 91 mg/dL (ref 65–99)
Glucose-Capillary: 110 mg/dL — ABNORMAL HIGH (ref 65–99)

## 2017-03-23 MED ORDER — GLUCOSE 40 % PO GEL
ORAL | Status: AC
  Administered 2017-03-23: 37.5 g via ORAL
  Filled 2017-03-23: qty 1

## 2017-03-23 MED ORDER — ISOSORBIDE MONONITRATE ER 30 MG PO TB24
30.0000 mg | ORAL_TABLET | Freq: Every day | ORAL | Status: DC
  Administered 2017-03-24 – 2017-03-27 (×4): 30 mg via ORAL
  Filled 2017-03-23 (×4): qty 1

## 2017-03-23 MED ORDER — BENZONATATE 100 MG PO CAPS
100.0000 mg | ORAL_CAPSULE | Freq: Three times a day (TID) | ORAL | Status: AC
  Administered 2017-03-23 – 2017-03-25 (×8): 100 mg via ORAL
  Filled 2017-03-23 (×8): qty 1

## 2017-03-23 MED ORDER — INSULIN GLARGINE 100 UNIT/ML ~~LOC~~ SOLN
10.0000 [IU] | Freq: Two times a day (BID) | SUBCUTANEOUS | Status: DC
  Administered 2017-03-23 – 2017-03-27 (×8): 10 [IU] via SUBCUTANEOUS
  Filled 2017-03-23 (×9): qty 0.1

## 2017-03-23 MED ORDER — INSULIN GLARGINE 100 UNIT/ML ~~LOC~~ SOLN: 12.0000 [IU] | Freq: Two times a day (BID) | SUBCUTANEOUS | Status: DC

## 2017-03-23 MED ORDER — GLUCOSE 40 % PO GEL
1.0000 | Freq: Once | ORAL | Status: AC
  Administered 2017-03-23: 37.5 g via ORAL

## 2017-03-23 NOTE — Progress Notes (Signed)
Patient ID: Jared [name], male   DOB: 1956-11-26, 61 y.o.   MRN: [id number]  [facility] Progress Note   Assessment/ Plan:   1. 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 Mental status now at baseline. In CIR for deconditioning following acute illness.  2. ESRD: Continue cyclic peritoneal dialysis at the current prescription with efforts at UF for better blood pressure control. Euvolemic on exam and without concerning signs or symptoms. Add tessalon for cough.  3. Anemia: His hemoglobin levels remain low without any overt loss, given ESA yesterday 4. CKD-MBD: Elevated phosphorus levels, on sevelamer 5. Hypertension: Fair BP with intermittent elevations, will increase Imdur to 30mg  PO q day.  Subjective:   Reports feeling well at this time but complains of intermittent dry cough. PD uneventful overnight.  Objective:   BP (!) 149/69 (BP Location: Right Arm)   Pulse 70   Temp 99.6 F (37.6 C) (Oral)   Resp 18   Ht 5' 9.6" (1.768 m)   Wt 76.5 kg (168 lb 10.4 oz)   SpO2 98%   BMI 24.48 kg/m   Physical Exam: Gen: Resting comfortably in bed CVS: Pulse regular rhythm, normal rate Resp: Clear to auscultation, no rales Abd: Soft, flat, PD catheter in situ Ext: No lower extremity edema  Labs: BMET  Recent Labs Lab 03/16/17 1624 03/17/17 0824 03/18/17 0230 03/20/17 0341 03/21/17 0740  NA 138 138 136 132* 129*  K 4.2 4.0 3.8 4.1 3.8  CL 100* 99* 98* 93* 92*  CO2 22 23 24 26 25   GLUCOSE 105* 238* 77 239* 321*  BUN 68* 55* 51* 46* 42*  CREATININE 12.14* 10.85* 10.39* 9.29* 9.18*  CALCIUM 8.2* 8.3* 8.0* 8.0* 7.8*  PHOS  --  7.1* 7.7* 8.0* 6.6*   CBC  Recent Labs Lab 03/16/17 1624 03/17/17 0824 03/18/17 0229 03/19/17 0250 03/20/17 0341 03/21/17 0740  WBC 6.7 6.4 7.1 7.4 7.5 6.2  NEUTROABS 5.1 4.5  --  5.7 6.0  --   HGB 9.1* 9.3* 8.3* 8.9* 8.7* 8.4*  HCT 26.5* 27.8* 25.4* 26.8* 26.3*  24.6*  MCV 80.8 82.2 81.4 81.0 80.7 81.2  PLT 172 165 197 201 252 284   Medications:    . calcium carbonate  1 tablet Oral Q6H  . [START ON 03/28/2017] darbepoetin (ARANESP) injection - NON-DIALYSIS  100 mcg Subcutaneous Q Wed-1800  . gentamicin cream   Topical TID  . heparin  5,000 Units Subcutaneous Q8H  . hydrALAZINE  100 mg Oral TID  . insulin aspart  0-9 Units Subcutaneous TID WC  . insulin glargine  8 Units Subcutaneous BID  . isosorbide mononitrate  15 mg Oral Daily  . levETIRAcetam  250 mg Oral BID  . metoprolol tartrate  100 mg Oral BID  . NIFEdipine  90 mg Oral Daily  . pantoprazole  40 mg Oral Q0600  . sevelamer carbonate  2,400 mg Oral TID WC   Elmarie Shiley, MD 03/23/2017, 8:49 AM

## 2017-03-23 NOTE — Progress Notes (Signed)
Occupational Therapy Session Note  Patient Details  Name: Jared [name] MRN: [id number] Date of Birth: October 29, 1956  Today's Date: 03/23/2017 OT Individual Time: 2725-3664 OT Individual Time Calculation (min): 58 min    Skilled Therapeutic Interventions/Progress Updates: Pt receiving peritoneal hemodialysis at time of arrival. HD RN reported that OT could proceed with tx at EOB. Pt engaged in card game activity for R UE coordination remediation, memory, and attention. Pt requiring mod cues for rule adherence and attending to task. After 30 min when HD was completed, pt completed dressing. He ambulated to dresser with min guard to select clothing items, gathered 2 shirts and 2 pants. He could not explain why he had picked extra clothing (returned them to drawers with instruction). Oral care/grooming tasks completed standing at sink afterwards. He initially started applying toothpaste onto hairbrush and self corrected himself. Pt applying shaving cream to wash cloth and almost overflowing sink with water. When asked what he was trying to do, he reported "I can't do this with you watching me.  " Pt agreeable to terminate tasks and ambulate back to bed. He was left with all needs and bed alarm activated at time of departure.   Therapy Documentation Precautions:  Precautions Precautions: Fall Precaution Comments: PD with port Restrictions Weight Bearing Restrictions: No General: General OT Amount of Missed Time: 30 Minutes Vital Signs: Therapy Vitals Pulse Rate: 84 Resp: 18 BP: 127/73 Pain: No c/o pain, though pt often holding head in hands during activity at EOB Pain Assessment Pain Assessment: No/denies pain Pain Score: 0-No pain ADL: ADL ADL Comments: see functional navigator Vision/Perception        See Function Navigator for Current Functional Status.   Therapy/Group: Individual Therapy  [name] A [name] 03/23/2017, 12:54 PM

## 2017-03-23 NOTE — Significant Event (Signed)
Hypoglycemic Event  CBG: 53  Treatment: 15 GM carbohydrate snack  Symptoms: None  Follow-up CBG: Time:1211 CBG Result:52  Possible Reasons for Event: Inadequate meal intake  Comments/MD notified: Marlowe [place], PA notified, 15 gram snack given. If patient needs additional carb coverage, will give glucose gel.    Creig Hines, Susa Raring

## 2017-03-23 NOTE — Significant Event (Signed)
Hypoglycemic Event  CBG:45  Treatment: 15 GM carbohydrate snack  Symptoms: patient was aware his blood sugar was dropping due to the way he was feeling, very drowsy.  Follow-up CBG: Time:1136 CBG Result:53  Possible Reasons for Event: Medication regimen: Had hyperglycemic event in AM and received and extra 8 units Novalog per MD request plus 8 units Lantus.  Comments/MD notified:Dan [place], PA notified, 15 gram snack given, and patient stated he felt better. He will need another 15 gram snack.    Creig Hines, Susa Raring

## 2017-03-23 NOTE — Progress Notes (Signed)
Physical Therapy Session Note  Patient Details  Name: Jared [name] MRN: [id number] Date of Birth: 1956/11/20  Today's Date: 03/23/2017 PT Individual Time: 1525-1540 PT Individual Time Calculation (min): 15 min  and Today's Date: 03/23/2017 PT Missed Time: 60 Minutes Missed Time Reason: Patient unwilling to participate;Patient ill (Comment)  Short Term Goals: Week 1:  PT Short Term Goal 1 (Week 1): = LTGs due to ELOS  Skilled Therapeutic Interventions/Progress Updates:    Pt asleep on PT arrival.  States he has not had a good day and has been nauseated and vomiting, declining participation.  PT provided education on goals of therapy and benefits of participation, and pt continued to decline, becoming minimally agitated with further encouragement from PT.  Pt left in bed with call bell in reach and needs met.   Therapy Documentation Precautions:  Precautions Precautions: Fall Precaution Comments: PD with port Restrictions Weight Bearing Restrictions: No General: PT Amount of Missed Time (min): 60 Minutes PT Missed Treatment Reason: Patient unwilling to participate;Patient ill (Comment)   See Function Navigator for Current Functional Status.   Therapy/Group: Individual Therapy  Earnest Conroy Penven-Crew 03/23/2017, 4:06 PM

## 2017-03-23 NOTE — Significant Event (Signed)
Hypoglycemic Event  CBG: 52  Treatment: 1 tube instant glucose  Symptoms: None  Follow-up CBG: Time:1248 CBG Result:75  Possible Reasons for Event: Inadequate meal intake  Comments/MD notified:Dr. Naaman Plummer on unit, notified him of patient's low blood sugars. Received order for one-time dose of glucose gel. Patient's blood glucose recovered to 75. Patient now resting with lunch at bedside. No additional complaints.    Creig Hines, Susa Raring

## 2017-03-23 NOTE — Progress Notes (Addendum)
[place] PHYSICAL MEDICINE & REHABILITATION     PROGRESS NOTE    Subjective/Complaints: Complaining about cough (?dry) which he had prior to coming to hospital. Right forearm a bit swollen too  ROS: pt denies nausea, vomiting, diarrhea, cough, shortness of breath or chest pain    Objective: Vital Signs: Blood pressure (!) 149/69, pulse 70, temperature 99.6 F (37.6 C), temperature source Oral, resp. rate 18, height 5' 9.6" (1.768 m), weight 76.5 kg (168 lb 10.4 oz), SpO2 98 %. No results found.  Recent Labs  03/21/17 0740  WBC 6.2  HGB 8.4*  HCT 24.6*  PLT 284    Recent Labs  03/21/17 0740  NA 129*  K 3.8  CL 92*  GLUCOSE 321*  BUN 42*  CREATININE 9.18*  CALCIUM 7.8*   CBG (last 3)   Recent Labs  03/23/17 0604 03/23/17 0622 03/23/17 0747  GLUCAP 415* 419* 261*    Wt Readings from Last 3 Encounters:  03/22/17 76.5 kg (168 lb 10.4 oz)  03/22/17 77 kg (169 lb 12.1 oz)  03/20/17 76.2 kg (167 lb 15.9 oz)    Physical Exam:  Constitutional: He appears well-developed and well-nourished.  HENT:  Head: Normocephalic and atraumatic.  Eyes: PERRL Neck: Normal range of motion. Neck supple. No thyromegaly present.  Cardiovascular: RRR  Respiratory: CTA B. No coughing. No wheezes, rales, rhonchi GI: Soft. Bowel sounds are normal. He exhibits no distension. There is no tenderness.  Musculoskeletal: He exhibits tr to 1+ edema right forearm, vein prominent/swollen/sl tender to touch.  Neurological: He is alert.  Easily aroused.   A&Ox2 Followed simple commands.  Motor: B/l UE 4/5 proximal to distal B/l LE: 4+/5 proximal to distal  Skin: Skin is warm and dry.  Psychiatric: His affect is blunt. His speech is delayed. He is slowed  Assessment/Plan: 1. Functional and cognitive deficits secondary to encephalopathy/seizure which require 3+ hours per day of interdisciplinary therapy in a comprehensive inpatient rehab setting. Physiatrist is providing close team  supervision and 24 hour management of active medical problems listed below. Physiatrist and rehab team continue to assess barriers to discharge/monitor patient progress toward functional and medical goals.  Function:  Bathing Bathing position   Position: Shower  Bathing parts Body parts bathed by patient: Right arm, Left arm, Chest, Abdomen, Front perineal area, Right upper leg, Left upper leg, Right lower leg, Left lower leg Body parts bathed by helper: Buttocks, Back  Bathing assist Assist Level: Touching or steadying assistance(Pt > 75%)      Upper Body Dressing/Undressing Upper body dressing   What is the patient wearing?: Pull over shirt/dress     Pull over shirt/dress - Perfomed by patient: Thread/unthread right sleeve, Thread/unthread left sleeve, Put head through opening, Pull shirt over trunk          Upper body assist Assist Level: Set up, Supervision or verbal cues   Set up : To obtain clothing/put away  Lower Body Dressing/Undressing Lower body dressing   What is the patient wearing?: Underwear, Pants, Socks, Shoes Underwear - Performed by patient: Thread/unthread right underwear leg, Thread/unthread left underwear leg, Pull underwear up/down   Pants- Performed by patient: Thread/unthread right pants leg, Thread/unthread left pants leg, Pull pants up/down       Socks - Performed by patient: Don/doff right sock, Don/doff left sock   Shoes - Performed by patient: Don/doff right shoe, Don/doff left shoe, Fasten right, Fasten left            Lower  body assist Assist for lower body dressing: Touching or steadying assistance (Pt > 75%)      Toileting Toileting   Toileting steps completed by patient: Adjust clothing prior to toileting, Performs perineal hygiene, Adjust clothing after toileting   Toileting Assistive Devices: Grab bar or rail  Toileting assist Assist level: More than reasonable time, Touching or steadying assistance (Pt.75%)    Transfers Chair/bed transfer   Chair/bed transfer method: Stand pivot Chair/bed transfer assist level: Moderate assist (Pt 50 - 74%/lift or lower) Chair/bed transfer assistive device: Armrests     Locomotion Ambulation     Max distance: 150 Assist level: Touching or steadying assistance (Pt > 75%)   Wheelchair     Max wheelchair distance: 50 Assist Level: Touching or steadying assistance (Pt > 75%)  Cognition Comprehension Comprehension assist level: Understands basic 90% of the time/cues < 10% of the time  Expression Expression assist level: Expresses basic 90% of the time/requires cueing < 10% of the time.  Social Interaction Social Interaction assist level: Interacts appropriately 90% of the time - Needs monitoring or encouragement for participation or interaction.  Problem Solving Problem solving assist level: Solves basic 50 - 74% of the time/requires cueing 25 - 49% of the time  Memory Memory assist level: Recognizes or recalls 25 - 49% of the time/requires cueing 50 - 75% of the time   Medical Problem List and Plan: 1.  Decreased functional mobility, slurred speech secondary to metabolic encephalopathy/fever/ hyperglycemia  -continue therapies 2.  DVT Prophylaxis/Anticoagulation: Subcutaneous heparin. Monitor platelet counts of any signs of bleeding 3. Pain Management: Tylenol as needed 4. Mood: Ativan as needed 5. Neuropsych: This patient is capable of making decisions on his own behalf. 6. Skin/Wound Care: Routine skin checks 7. Fluids/Electrolytes/Nutrition: Routine I&O with follow-up chemistries per renal/according to PD requirements 8. ESRD. Hemodialysis as per renal services 9. Hypertension. Hydralazine 100 mg 3 times a day, Lopressor 100 mg twice a day, Imdur 15 mg daily, nifedipine 90 mg daily. Monitor with increased mobility 10. Fever. Blood cultures negative. Follow-up infectious disease. Empiric antibiotics discontinued  -afebrile 11. Seizure disorder. Keppra  250 mg twice a day 12. Diabetes mellitus and peripheral neuropathy. Hemoglobin A1c 8.5. Lantus insulin 8 units BID  -sugars have been high, especially in the AM (over 400 this am)  -cover with novolog insulin now  -increase lantus to 10 units bid 13. History of prostate cancer. Follow-up outpatient Lupron injections with urology services. 14. Acute on chronic anemia. Aranesp 15. Chronic dry cough--tessalon added today.   -lungs clear. Appears euvolemic  -no obvious medications that could be contributing  -?reflux 16. Mild thrombophlebitis Right forearm---  -moist heat, elevation, and observe  -prn tylenol   LOS (Days) 2 A FACE TO FACE EVALUATION WAS PERFORMED  [name], MD 03/23/2017 9:41 AM

## 2017-03-23 NOTE — Progress Notes (Signed)
Occupational Therapy Note  Patient Details  Name: Jared [name] MRN: [id number] Date of Birth: Jan 14, 1956  Today's Date: 03/23/2017 OT Missed Time: 22 Minutes Missed Time Reason: Nursing care (low blood sugar)  Pt missed 30 mins scheduled OT session secondary to low blood sugar.  Per NT, pt had recently received snack due to low blood sugar, however still low: 53.  Pt appearing lethargic as well, therefore deferred to allow further time and intervention to bring blood sugar back up.  Will continue to follow as able.  Simonne Come 03/23/2017, 11:42 AM

## 2017-03-23 NOTE — IPOC Note (Addendum)
Overall Plan of Care [facility]) Patient Details Name: Jared [name] MRN: [id number] DOB: 1956/04/01  Admitting Diagnosis: hyperglycemia  Hospital Problems: Active Problems:   Metabolic encephalopathy     Functional Problem List: Nursing Behavior, Bladder, Bowel, Edema, Endurance, Medication Management, Nutrition, Pain, Perception, Safety, Sensory, Motor, Skin Integrity  PT Balance, Behavior, Endurance, Motor, Safety  OT Balance, Safety, Sensory, Cognition, Endurance, Motor, Perception  SLP    TR         Basic ADL's: OT Eating, Grooming, Bathing, Dressing, Toileting     Advanced  ADL's: OT       Transfers: PT Bed Mobility, Bed to Chair, Car, Furniture, Futures trader, Metallurgist: PT Ambulation, Emergency planning/management officer, Stairs     Additional Impairments: OT Fuctional Use of Upper Extremity  SLP Communication, Social Cognition, Swallowing comprehension, expression Memory, Awareness, Problem Solving  TR      Anticipated Outcomes Item Anticipated Outcome  Self Feeding    Swallowing  Mod I   Basic self-care   supervision   Toileting    Mod I   Bathroom Transfers  mod I  Bowel/Bladder  min assist.  Transfers  modified independent basic, supervision car  Locomotion  modified independent gait x 50' household, supervision x 150' controlled and community env, supervision 2 steps no rails to enter home, and 12 steps 2 rails to access 2nd floor  Communication  Mod I  Cognition  Supervision-Min A   Pain  less<2  Safety/Judgment  min assist.   Therapy Plan: PT Intensity: Minimum of 1-2 x/day ,45 to 90 minutes PT Frequency: 5 out of 7 days PT Duration Estimated Length of Stay: 7-10 OT Intensity: Minimum of 1-2 x/day, 45 to 90 minutes OT Frequency: 5 out of 7 days OT Duration/Estimated Length of Stay: ~7-10 days SLP Intensity: Minumum of 1-2 x/day, 30 to 90 minutes SLP Frequency: 3 to 5 out of 7 days SLP Duration/Estimated Length of Stay: 7-10 days         Team Interventions: Nursing Interventions Patient/Family Education, Bladder Management, Bowel Management, Disease Management/Prevention, Pain Management, Medication Management, Skin Care/Wound Management, Cognitive Remediation/Compensation, Dysphagia/Aspiration Precaution Training, Discharge Planning, Psychosocial Support  PT interventions Ambulation/gait training, Training and development officer, Cognitive remediation/compensation, Discharge planning, Community reintegration, DME/adaptive equipment instruction, Functional electrical stimulation, Functional mobility training, Patient/family education, Pain management, Neuromuscular re-education, Psychosocial support, Splinting/orthotics, Therapeutic Exercise, Therapeutic Activities, Stair training, UE/LE Strength taining/ROM, UE/LE Coordination activities, Wheelchair propulsion/positioning  OT Interventions Balance/vestibular training, Discharge planning, Pain management, Self Care/advanced ADL retraining, Therapeutic Activities, UE/LE Coordination activities, Visual/perceptual remediation/compensation, Therapeutic Exercise, Skin care/wound managment, Patient/family education, Functional mobility training, Cognitive remediation/compensation, Disease mangement/prevention, Academic librarian, Engineer, drilling, Neuromuscular re-education, Psychosocial support, UE/LE Strength taining/ROM  SLP Interventions Cognitive remediation/compensation, English as a second language teacher, Functional tasks, Patient/family education, Environmental controls, Dysphagia/aspiration precaution training, Internal/external aids, Speech/Language facilitation, Therapeutic Activities  TR Interventions    SW/CM Interventions Discharge Planning, Psychosocial Support, Patient/Family Education    Team Discharge Planning: Destination: PT-Home ,OT- Home , SLP-Home Projected Follow-up: PT-Home health PT, OT-  Home health OT, SLP-24 hour supervision/assistance, Home Health  SLP Projected Equipment Needs: PT-To be determined, OT- To be determined, SLP-None recommended by SLP Equipment Details: PT- , OT-  Patient/family involved in discharge planning: PT- Patient,  OT-Patient, SLP-Patient  MD ELOS: 7-10 days Medical Rehab Prognosis:  Excellent Assessment: The patient has been admitted for CIR therapies with the diagnosis of encephalopathy/debility. The team will be addressing functional mobility, strength, stamina, balance, safety, adaptive techniques and equipment, self-care,  bowel and bladder mgt, patient and caregiver education, NMR, cognitive perceptual rx, pain control, community reintegration . Goals have been set at mod I to supervision with self-care/ADL's and mobility and supervision to min assist with cognition   [name], MD, [facility]      See Team Conference Notes for weekly updates to the plan of care

## 2017-03-23 NOTE — Plan of Care (Signed)
Problem: RH BOWEL ELIMINATION Goal: RH STG MANAGE BOWEL WITH ASSISTANCE STG Manage Bowel with supervision Assistance.   Outcome: Progressing No BM this shift  Problem: RH BLADDER ELIMINATION Goal: RH STG MANAGE BLADDER WITH ASSISTANCE STG Manage Bladder With supervision Assistance   Total care with condom catheter  Problem: RH SKIN INTEGRITY Goal: RH STG SKIN FREE OF INFECTION/BREAKDOWN Skin to remain free from breakdown and infection while on rehab with supervision assistance.  Outcome: Progressing No S/S of infection noted Goal: RH STG MAINTAIN SKIN INTEGRITY WITH ASSISTANCE STG Maintain Skin Integrity With supervision Assistance.   Outcome: Progressing No skin issues noted  Problem: RH SAFETY Goal: RH STG ADHERE TO SAFETY PRECAUTIONS W/ASSISTANCE/DEVICE STG Adhere to Safety Precautions With supervision Assistance and appropriate assistive Device.   Outcome: Progressing Verbalized understanding of safety precaution  Problem: RH PAIN MANAGEMENT Goal: RH STG PAIN MANAGED AT OR BELOW PT'S PAIN GOAL <3 on a 0-10 pain scale  Outcome: Progressing Denied pain

## 2017-03-23 NOTE — Progress Notes (Signed)
Speech Language Pathology Note  Patient Details  Name: Jared [name] MRN: [id number] Date of Birth: 1955/12/27 Today's Date: 03/23/2017  Patient missed 30 minutes of skilled SLP intervention due to not feeling well with recent nausea with emesis. RN aware.    [name] 03/23/2017, 3:53 PM

## 2017-03-23 NOTE — Progress Notes (Signed)
Social Work  Social Work Assessment and Plan  Patient Details  Name: Jared [name] MRN: [id number] Date of Birth: 06-17-1956  Today's Date: 03/23/2017  Problem List:  Patient Active Problem List   Diagnosis Date Noted  . Metabolic encephalopathy 93/23/5573  . Encephalopathy   . Benign essential HTN   . Seizures ([place])   . Type 2 diabetes mellitus with peripheral neuropathy (HCC)   . History of prostate cancer   . Acute blood loss anemia   . FUO (fever of unknown origin)   . Global aphasia 03/17/2017  . Slurred speech   . Witnessed seizure-like activity ([place])   . Fever   . Weakness   . Acute encephalopathy 03/13/2017  . Prostate cancer ([place]) 03/13/2017  . Hyperglycemia 03/13/2017  . Accelerated hypertension 03/13/2017  . Elevated LDL cholesterol level 01/24/2017  . Diabetes mellitus due to underlying condition, uncontrolled, without complication, with long-term current use of insulin ([place]) 01/04/2017  . ESRD on dialysis ([place])   . Anemia of chronic disease   . Abdominal pain 12/08/2016  . Enlarged prostate 12/08/2016  . 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 Hypertension   . CKD (chronic kidney disease) stage V requiring chronic dialysis ([place])   . Acute respiratory failure with hypoxia ([place])   . Seizure ([place]) 11/24/2016  . Status epilepticus [facility])    Past Medical History:  Past Medical History:  Diagnosis Date  . Anemia   . Chronic kidney disease    on dialysis M-W-F  . Diabetes mellitus without complication ([place])    onset as adult  . Hypertension   . Seizures ([place])   . Stroke ([place])   . Thyroid disease    hyperparathyroidism   Past Surgical History:  Past Surgical History:  Procedure Laterality Date  . AV FISTULA PLACEMENT Left 06/04/2014   Procedure: ARTERIOVENOUS (AV) FISTULA CREATION-  BRACHIOCEPHALIC WITH LIGATION OF COMPETEING BRANCH;  Surgeon: Mal Misty, MD;  Location: [facility];  Service: Vascular;  Laterality: Left;  . INSERTION OF DIALYSIS CATHETER  04/2014  .  IR GENERIC HISTORICAL Left 11/30/2016   IR THROMBECTOMY AV FISTULA W/THROMBOLYSIS/PTA/STENT INC/SHUNT/IMG LT 11/30/2016 Markus Daft, MD [facility]INTER[facility]  . IR GENERIC HISTORICAL  11/30/2016   IR US GUIDE VASC ACCESS LEFT 11/30/2016 Markus Daft, MD [facility]   Social History:  reports that he quit smoking about 32 years ago. He has never used smokeless tobacco. He reports that he drinks about 0.6 oz of alcohol per week . He reports that he does not use drugs.  Family / Support Systems Marital Status: Divorced Patient Roles: Parent Children: son, Jared [name] @ [phone] Other Supports: sister, Jared [name] @ [phone] or ([phone];  sister, Jared [name] @ [phone] Anticipated Caregiver: sister Ability/Limitations of Caregiver: supervision level only Caregiver Availability: 24/7 Family Dynamics: Pt has been living with sister, Jared [name], however, she reports she has other caregiver responsibilities.  Social History Preferred language: English Religion: Christian Cultural Background: NA Employment Status: Disabled Date Retired/Disabled/Unemployed: ~ 2 yrs Freight forwarder Issues: None Guardian/Conservator: None - per MD, pt is capable of making decisions on his own behalf.   Abuse/Neglect Physical Abuse: Denies Verbal Abuse: Denies Sexual Abuse: Denies Exploitation of patient/patient's resources: Denies Self-Neglect: Denies  Emotional Status Pt's affect, behavior adn adjustment status: Pt lying in bed and states her remembers me as I worked with him during [place] stay in Jan 2018.  He does admit that he feels fatigued and concerned his blood sugar is low -  requested for tech to check while we talked.  Pt not very talkative and simpley reports "nothing's changed" about his living situation since last stay.  He denies any significant emotional distress but will monitor and refer for neuropsychology eval as indicated Recent Psychosocial Issues: None Pyschiatric History:  None Substance Abuse History: None  Patient / Family Perceptions, Expectations & Goals Pt/Family understanding of illness & functional limitations: Pt reports "They (MDs) aren't real sure what's going on... maybe the infection..."  He does have a good understanding of his current functional limitations due to debility and reason for CIR again.  Sister with good understanding of medical issues. 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 Anticipated changes in roles/activities/participation: Little change aniticipated if can reach mod ind/ supervision level as this was level PTA Pt/family expectations/goals: "Need to get home."  Community Resources Premorbid Home Care/DME Agencies: Other (Comment) ([facility]) Transportation available at discharge: yes Resource referrals recommended: Neuropsychology  Discharge Planning Living Arrangements: Other relatives Support Systems: Children, Other relatives Type of Residence: Private residence Insurance Resources: [employer] Financial Resources: [employer] Financial Screen Referred: No Living Expenses: Lives with family Money Management: Patient Does the patient have any problems obtaining your medications?: No Home Management: pt and family share Patient/Family Preliminary Plans: Pt to return home with sister, Jared [name], and family who can provide supervision only Social Work Anticipated Follow Up Needs: HH/OP Expected length of stay: ELOS 8-12 days  Clinical Impression Unfortunate gentleman who returns to CIR (here 11/2016) following seizures/ encephalopathy.  Presents with flat affect but able to complete assessment interview with brief, minimal answers.  He denies any emotional distress.  Plans to return to sister's home as he did before.  Tx team setting mod ind/ supervision goals. Will follow for support and d/c planning needs.  Jared [name] 03/23/2017, 1:53 PM

## 2017-03-23 NOTE — Care Management Note (Signed)
Inpatient Rehabilitation Center Individual Statement of Services  Patient Name:  Jared [name]  Date:  03/23/2017  Welcome to the [facility].  Our goal is to provide you with an individualized program based on your diagnosis and situation, designed to meet your specific needs.  With this comprehensive rehabilitation program, you will be expected to participate in at least 3 hours of rehabilitation therapies Monday-Friday, with modified therapy programming on the weekends.  Your rehabilitation program will include the following services:  Physical Therapy (PT), Occupational Therapy (OT), Speech Therapy (ST), 24 hour per day rehabilitation nursing, Therapeutic Recreaction (TR), Neuropsychology, Case Management (Social Worker), Rehabilitation Medicine, Nutrition Services and Pharmacy Services  Weekly team conferences will be held on Tuesdays to discuss your progress.  Your Social Worker will talk with you frequently to get your input and to update you on team discussions.  Team conferences with you and your family in attendance may also be held.  Expected length of stay: 7-10 days  Overall anticipated outcome: supervision  Depending on your progress and recovery, your program may change. Your Social Worker will coordinate services and will keep you informed of any changes. Your Social Worker's name and contact numbers are listed  below.  The following services may also be recommended but are not provided by the [facility] will be made to provide these services after discharge if needed.  Arrangements include referral to agencies that provide these services.  Your insurance has been verified to be:  Medicare Your primary doctor is:  Harland Dingwall  Pertinent information will be shared with your doctor and your insurance company.  Social  Worker:  [place], [place] or ([phone]   Information discussed with and copy given to patient by: Lennart Pall, 03/23/2017, 1:22 PM

## 2017-03-24 ENCOUNTER — Inpatient Hospital Stay (HOSPITAL_COMMUNITY): Payer: Medicare Other | Admitting: Speech Pathology

## 2017-03-24 ENCOUNTER — Inpatient Hospital Stay (HOSPITAL_COMMUNITY): Payer: Medicare Other

## 2017-03-24 ENCOUNTER — Inpatient Hospital Stay (HOSPITAL_COMMUNITY): Payer: Medicare Other | Admitting: Physical Therapy

## 2017-03-24 DIAGNOSIS — R5381 Other malaise: Secondary | ICD-10-CM

## 2017-03-24 LAB — CULTURE, BLOOD (ROUTINE X 2)
CULTURE: NO GROWTH
SPECIAL REQUESTS: ADEQUATE

## 2017-03-24 LAB — GLUCOSE, CAPILLARY
GLUCOSE-CAPILLARY: 212 mg/dL — AB (ref 65–99)
GLUCOSE-CAPILLARY: 62 mg/dL — AB (ref 65–99)
GLUCOSE-CAPILLARY: 95 mg/dL (ref 65–99)
GLUCOSE-CAPILLARY: 84 mg/dL (ref 65–99)
GLUCOSE-CAPILLARY: 159 mg/dL — AB (ref 65–99)

## 2017-03-24 MED ORDER — GLUCOSE 40 % PO GEL
1.0000 | Freq: Once | ORAL | Status: AC | PRN
  Administered 2017-03-24: 37.5 g via ORAL

## 2017-03-24 MED ORDER — GLUCOSE 40 % PO GEL
ORAL | Status: AC
  Administered 2017-03-24: 37.5 g via ORAL
  Filled 2017-03-24: qty 1

## 2017-03-24 MED ORDER — LORATADINE 10 MG PO TABS
10.0000 mg | ORAL_TABLET | Freq: Every day | ORAL | Status: DC
  Administered 2017-03-24 – 2017-03-27 (×4): 10 mg via ORAL
  Filled 2017-03-24 (×4): qty 1

## 2017-03-24 NOTE — Progress Notes (Signed)
[facility] PHYSICAL MEDICINE & REHABILITATION     PROGRESS NOTE    Subjective/Complaints:   ROS: pt denies nausea, vomiting, diarrhea, cough, shortness of breath or chest pain    Objective: Vital Signs: Blood pressure 137/70, pulse 89, temperature 99.6 F (37.6 C), temperature source Oral, resp. rate 18, height 5' 9.6" (1.768 m), weight 74.8 kg (165 lb), SpO2 97 %. No results found. No results for input(s): WBC, HGB, HCT, PLT in the last 72 hours. No results for input(s): NA, K, CL, GLUCOSE, BUN, CREATININE, CALCIUM in the last 72 hours.  Invalid input(s): CO CBG (last 3)   Recent Labs  03/23/17 1624 03/23/17 2108 03/24/17 0652  GLUCAP 91 110* 212*    Wt Readings from Last 3 Encounters:  03/24/17 74.8 kg (165 lb)  03/22/17 77 kg (169 lb 12.1 oz)  03/20/17 76.2 kg (167 lb 15.9 oz)    Physical Exam:  Constitutional: He appears well-developed and well-nourished.  HENT:  Head: Normocephalic and atraumatic.  Eyes: PERRL Neck: Normal range of motion. Neck supple. No thyromegaly present.  Cardiovascular: RRR  Respiratory: CTA B. No coughing. No wheezes, rales, rhonchi GI: Soft. Bowel sounds are normal. He exhibits no distension. There is no tenderness.  Musculoskeletal: He exhibits tr to 1+ edema right forearm, vein prominent/swollen/sl tender to touch.  Neurological: He is alert.  Easily aroused.   A&Ox2 Followed simple commands.  Motor: B/l UE 4/5 proximal to distal B/l LE: 4+/5 proximal to distal  Skin: Skin is warm and dry.  Psychiatric: His affect is blunt. His speech is delayed. He is slowed  Assessment/Plan: 1. Functional and cognitive deficits secondary to encephalopathy/seizure which require 3+ hours per day of interdisciplinary therapy in a comprehensive inpatient rehab setting. Physiatrist is providing close team supervision and 24 hour management of active medical problems listed below. Physiatrist and rehab team continue to assess barriers to  discharge/monitor patient progress toward functional and medical goals.  Function:  Bathing Bathing position   Position: Shower  Bathing parts Body parts bathed by patient: Right arm, Left arm, Chest, Abdomen, Front perineal area, Right upper leg, Left upper leg, Right lower leg, Left lower leg Body parts bathed by helper: Buttocks, Back  Bathing assist Assist Level: Touching or steadying assistance(Pt > 75%)      Upper Body Dressing/Undressing Upper body dressing   What is the patient wearing?: Pull over shirt/dress     Pull over shirt/dress - Perfomed by patient: Thread/unthread right sleeve, Thread/unthread left sleeve, Put head through opening, Pull shirt over trunk          Upper body assist Assist Level: Supervision or verbal cues   Set up : To obtain clothing/put away  Lower Body Dressing/Undressing Lower body dressing   What is the patient wearing?: Underwear, Pants, Non-skid slipper socks, Ted Hose Underwear - Performed by patient: Thread/unthread right underwear leg, Thread/unthread left underwear leg, Pull underwear up/down   Pants- Performed by patient: Thread/unthread right pants leg, Thread/unthread left pants leg, Pull pants up/down   Non-skid slipper socks- Performed by patient: Don/doff right sock, Don/doff left sock   Socks - Performed by patient: Don/doff right sock, Don/doff left sock   Shoes - Performed by patient: Don/doff right shoe, Don/doff left shoe, Fasten right, Fasten left         TED Hose - Performed by helper: Don/doff right TED hose, Don/doff left TED hose  Lower body assist Assist for lower body dressing: Touching or steadying assistance (Pt > 75%)  Toileting Toileting   Toileting steps completed by patient: Adjust clothing prior to toileting, Performs perineal hygiene, Adjust clothing after toileting   Toileting Assistive Devices: Grab bar or rail  Toileting assist Assist level: More than reasonable time   Transfers Chair/bed  transfer   Chair/bed transfer method: Stand pivot Chair/bed transfer assist level: Moderate assist (Pt 50 - 74%/lift or lower) Chair/bed transfer assistive device: Armrests     Locomotion Ambulation     Max distance: 150 Assist level: Touching or steadying assistance (Pt > 75%)   Wheelchair     Max wheelchair distance: 50 Assist Level: Touching or steadying assistance (Pt > 75%)  Cognition Comprehension Comprehension assist level: Understands basic 90% of the time/cues < 10% of the time  Expression Expression assist level: Expresses basic 90% of the time/requires cueing < 10% of the time.  Social Interaction Social Interaction assist level: Interacts appropriately 90% of the time - Needs monitoring or encouragement for participation or interaction.  Problem Solving Problem solving assist level: Solves basic 50 - 74% of the time/requires cueing 25 - 49% of the time  Memory Memory assist level: Recognizes or recalls 25 - 49% of the time/requires cueing 50 - 75% of the time   Medical Problem List and Plan: 1.  Decreased functional mobility, slurred speech secondary to metabolic encephalopathy/fever/ hyperglycemia  -PT, OT, SLP  2.  DVT Prophylaxis/Anticoagulation: Subcutaneous heparin. Monitor platelet counts of any signs of bleeding 3. Pain Management: Tylenol as needed 4. Mood: Ativan as needed 5. Neuropsych: This patient is capable of making decisions on his own behalf. 6. Skin/Wound Care: Routine skin checks 7. Fluids/Electrolytes/Nutrition: Routine I&O with follow-up chemistries per renal/according to PD requirements 8. ESRD. Hemodialysis as per renal services 9. Hypertension. Hydralazine 100 mg 3 times a day, Lopressor 100 mg twice a day, Imdur 15 mg daily, nifedipine 90 mg daily. Monitor with increased mobility- good range currently Vitals:   03/24/17 0537 03/24/17 0831  BP: 134/72 137/70  Pulse: 88 89  Resp: 18   Temp: 99.6 F (37.6 C)    10. Fever. Blood cultures  negative. Follow-up infectious disease. Empiric antibiotics discontinued  -afebrile 11. Seizure disorder. Keppra 250 mg twice a day, no recent sz 12. Diabetes mellitus and peripheral neuropathy. Hemoglobin A1c 8.5. Lantus insulin 8 units BID  -sugars have been high, especially in the AM (over 400 this am)  -cover with novolog insulin now  -increase lantus to 10 units bid 13. History of prostate cancer. Follow-up outpatient Lupron injections with urology services. 14. Acute on chronic anemia. Aranesp 15. Chronic dry cough--tessalon added today.   -lungs clear. Appears euvolemic  -no obvious medications that could be contributing   -?reflux 16. Mild thrombophlebitis Right forearm---  -moist heat, elevation, and observe  -prn tylenol   LOS (Days) 3 A FACE TO FACE EVALUATION WAS PERFORMED  Charlett Blake, MD 03/24/2017 8:58 AM

## 2017-03-24 NOTE — Progress Notes (Signed)
Occupational Therapy Session Note  Patient Details  Name: Jared [name] MRN: [id number] Date of Birth: Oct 22, 1956  Today's Date: 03/24/2017 OT Individual Time: 1005-1104 OT Individual Time Calculation (min): 59 min   Missed time 15 min Missed time reason: Pt missed 15 min skilled OT services d/t nausea. Will follow up as appropriate.   Short Term Goals: Week 1:  OT Short Term Goal 1 (Week 1): STG=LTG due to short anticipated LOS  Skilled Therapeutic Interventions/Progress Updates:    1:1 no report of pain, however pt reports nausea at beginning of session and request follow up in a few min. 15 min later, OT returned and pt agreeable to bathe and dress. Pt ambulate with CGA-supervision into bathroom with VC for safety awareness during transfer onto TTB. Pt shower in seated and standing with supervision. Pt repeatedly says. "Im good." when OT attempts to provide supervision when standing for safety. Educated pt on fall risk during shower while standing. Despite encouragement to sit to bathe all body parts except for peri area/buttocks, pt declines and stands in shower stall. Pt reports later feeling uncomfortable with people watching him and it makes him, "mess up." OT provided education on OT and safety and pt verbalizes understanding. Pt dons UB and LB clothing in seated/standing with set up. Pt stands at sink to brush hair teeth with VC to locate toothpaste on R of sink. Exited session with pt seated in w/c with call light in reach and all needs met.    Therapy Documentation Precautions:  Precautions Precautions: Fall Precaution Comments: PD with port Restrictions Weight Bearing Restrictions: No   ADL: ADL ADL Comments: see functional navigator  See Function Navigator for Current Functional Status.   Therapy/Group: Individual Therapy  Tonny Branch 03/24/2017, 11:07 AM

## 2017-03-24 NOTE — Progress Notes (Signed)
Physical Therapy Session Note  Patient Details  Name: Jared [name] MRN: [id number] Date of Birth: 11-Jul-1956  Today's Date: 03/24/2017 PT Individual Time: 1300-1400 PT Individual Time Calculation (min): 60 min   Short Term Goals: Week 1:  PT Short Term Goal 1 (Week 1): = LTGs due to ELOS  Skilled Therapeutic Interventions/Progress Updates:  Pt was seen bedside in the pm. Pt ambulated 150 and 300 feet without assistive device with c/s to min guard. Pt performed multiple sit to stand transfers with S and stand pivot transfers with c/s to min guard. In gym treatment focused on cone taps and alternating cone taps 3 sets x 10 reps each for NMR. Pt returned to room following treatment. Pt transferred edge of bed to supine with S. Pt left sitting up in bed with call bell within reach.   Therapy Documentation Precautions:  Precautions Precautions: Fall Precaution Comments: PD with port Restrictions Weight Bearing Restrictions: No General:   Vital Signs: Therapy Vitals Pulse Rate: 77 BP: 140/74 Patient Position (if appropriate): Sitting Oxygen Therapy SpO2: 100 % O2 Device: Not Delivered Pain: Pain Assessment Pain Assessment: No/denies pain    Balance: Standardized Balance Assessment Standardized Balance Assessment: Berg Balance Test Berg Balance Test Sit to Stand: Able to stand without using hands and stabilize independently Standing Unsupported: Able to stand safely 2 minutes Sitting with Back Unsupported but Feet Supported on Floor or Stool: Able to sit safely and securely 2 minutes Stand to Sit: Sits safely with minimal use of hands Transfers: Able to transfer safely, minor use of hands Standing Unsupported with Eyes Closed: Able to stand 10 seconds safely Standing Ubsupported with Feet Together: Able to place feet together independently and stand 1 minute safely From Standing, Reach Forward with Outstretched Arm: Can reach forward >12 cm safely (5") From Standing  Position, Pick up Object from Floor: Able to pick up shoe safely and easily From Standing Position, Turn to Look Behind Over each Shoulder: Looks behind from both sides and weight shifts well Turn 360 Degrees: Able to turn 360 degrees safely in 4 seconds or less Standing Unsupported, Alternately Place Feet on Step/Stool: Able to stand independently and safely and complete 8 steps in 20 seconds Standing Unsupported, One Foot in Front: Able to plae foot ahead of the other independently and hold 30 seconds Standing on One Leg: Able to lift leg independently and hold equal to or more than 3 seconds Total Score: 52  See Function Navigator for Current Functional Status.   Therapy/Group: Individual Therapy  Dub Amis 03/24/2017, 3:35 PM

## 2017-03-24 NOTE — Progress Notes (Signed)
Hypoglycemic Event  CBG: 62  Treatment: 1 tube instant glucose  Symptoms: None  Follow-up CBG: Time:1220 CBG Result:93  Possible Reasons for Event: Unknown  Comments/MD notified Dr. Ronaldo Miyamoto, Dale[place] Bo

## 2017-03-24 NOTE — Progress Notes (Signed)
Dressing at peritoneal dialysis site changed.

## 2017-03-24 NOTE — Progress Notes (Signed)
Patient ID: Jared [name], male   DOB: Jul 23, 1956, 61 y.o.   MRN: [id number]  [facility] Progress Note   Assessment/ Plan:   1. 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 His mental status is now back to baseline as he continues strengthening in CIR.  2. ESRD: Continue cyclic peritoneal dialysis at the current prescription---it is doubtful that his initial presentation was indeed from uremia but a suspicion is raised by the serial improvement of BUN noted since admission with ongoing PD. Euvolemic on exam and without concerning signs or symptoms. Add loratadine for cough/congestion.  3. Anemia: His hemoglobin levels remain low without any overt loss, given ESA yesterday 4. CKD-MBD: slowly improving phosphorus levels, on sevelamer 5. Hypertension: Blood pressures under good control at this time.  Subjective:   Reports feeling well at this time but continues to complain of intermittent dry cough. PD uneventful overnight.  Objective:   BP 137/70   Pulse 89   Temp 99.6 F (37.6 C) (Oral)   Resp 18   Ht 5' 9.6" (1.768 m)   Wt 74.8 kg (165 lb)   SpO2 97%   BMI 23.95 kg/m   Physical Exam: Gen: Resting comfortably in bed CVS: Pulse regular rhythm, normal rate Resp: Clear to auscultation, no rales Abd: Soft, flat, PD catheter in situ Ext: No lower extremity edema  Labs: BMET  Recent Labs Lab 03/18/17 0230 03/20/17 0341 03/21/17 0740  NA 136 132* 129*  K 3.8 4.1 3.8  CL 98* 93* 92*  CO2 24 26 25   GLUCOSE 77 239* 321*  BUN 51* 46* 42*  CREATININE 10.39* 9.29* 9.18*  CALCIUM 8.0* 8.0* 7.8*  PHOS 7.7* 8.0* 6.6*   CBC  Recent Labs Lab 03/18/17 0229 03/19/17 0250 03/20/17 0341 03/21/17 0740  WBC 7.1 7.4 7.5 6.2  NEUTROABS  --  5.7 6.0  --   HGB 8.3* 8.9* 8.7* 8.4*  HCT 25.4* 26.8* 26.3* 24.6*  MCV 81.4 81.0 80.7 81.2  PLT 197 201 252 284   Medications:    . benzonatate  100 mg  Oral TID  . calcium carbonate  1 tablet Oral Q6H  . [START ON 03/28/2017] darbepoetin (ARANESP) injection - NON-DIALYSIS  100 mcg Subcutaneous Q Wed-1800  . gentamicin cream   Topical TID  . heparin  5,000 Units Subcutaneous Q8H  . hydrALAZINE  100 mg Oral TID  . insulin aspart  0-9 Units Subcutaneous TID WC  . insulin glargine  10 Units Subcutaneous BID  . isosorbide mononitrate  30 mg Oral Daily  . levETIRAcetam  250 mg Oral BID  . loratadine  10 mg Oral Daily  . metoprolol tartrate  100 mg Oral BID  . NIFEdipine  90 mg Oral Daily  . pantoprazole  40 mg Oral Q0600  . sevelamer carbonate  2,400 mg Oral TID WC   Elmarie Shiley, MD 03/24/2017, 9:21 AM

## 2017-03-24 NOTE — Progress Notes (Signed)
Speech Language Pathology Daily Session Note  Patient Details  Name: Jared [name] MRN: [id number] Date of Birth: 1956/05/08  Today's Date: 03/24/2017 SLP Individual Time: 1100-1130 SLP Individual Time Calculation (min): 30 min  Short Term Goals: Week 1: SLP Short Term Goal 1 (Week 1): Patient will consume current diet with minimal overt s/s of aspiration with Mod I.  SLP Short Term Goal 2 (Week 1): Patient will recall new, daily information with Mod A verbal and visual cues. SLP Short Term Goal 3 (Week 1): Patient will demonstrate functional problem solving for basic and familiar tasks with Min A verbal cues.  SLP Short Term Goal 4 (Week 1): Patient will self-monitor and correct verbal errors during informal and structured tasks with supervision verbal cues.  SLP Short Term Goal 5 (Week 1): Patient will follow multi-step commands with Min A verbal and visual cues.   Skilled Therapeutic Interventions: Skilled treatment session focused on addressing cognition goals. SLP facilitated session by providing set-up as well as Mod-Max assist verbal and visual cues for patient to self-monitor and correct errors related to a basic money management task.  Patient with difficulty with working memory of brief instructions as well as with cumulative math calculations.  Patient acknowledged his performance was not good and stated that it was due to be ill the last two days.  Patient reported that RN and MD were aware.  Patient left on commode and RN made aware. Continue with current plan of care.    Function:  Cognition Comprehension Comprehension assist level: Understands basic 90% of the time/cues < 10% of the time  Expression   Expression assist level: Expresses basic 90% of the time/requires cueing < 10% of the time.  Social Interaction Social Interaction assist level: Interacts appropriately 90% of the time - Needs monitoring or encouragement for participation or interaction.  Problem Solving  Problem solving assist level: Solves basic 50 - 74% of the time/requires cueing 25 - 49% of the time  Memory Memory assist level: Recognizes or recalls 25 - 49% of the time/requires cueing 50 - 75% of the time    Pain Pain Assessment Pain Assessment: No/denies pain  Therapy/Group: Individual Therapy  Carmelia Roller., [place] [phone]  [place] 03/24/2017, 12:51 PM

## 2017-03-24 NOTE — Progress Notes (Signed)
Occupational Therapy Session Note  Patient Details  Name: [name] MRN: [id number] Date of Birth: [birth year]  Today's Date: 03/24/2017 OT Individual Time: 1500-1509 OT Individual Time Calculation (min): 9 min    Short Term Goals: Week 1:  OT Short Term Goal 1 (Week 1): STG=LTG due to short anticipated LOS  Skilled Therapeutic Interventions/Progress Updates: Pt received supine in bed, covered and reporting chill with nausea despite room temp of 74 deg.  Food tray was observed untouched from lunch.   OT attempted bed level activity however pt reiterated request to discontinue treatment due to nausea.  Emesis bag was present at pt's side and all needs were placed within reach.  RN made aware of missed time d/t nausea.  Therapy Documentation Precautions: t  Precautions Precautions: Fall Precaution Comments: PD with port Restrictions Weight Bearing Restrictions: No   General: General OT Amount of Missed Time: 21 Minutes er   Vital Signs: Therapy Vitals Temp: 98.5 F (36.9 C) Temp Source: Oral Pulse Rate: 80 Resp: 18 BP: (!) 142/74 Patient Position (if appropriate): Lying Oxygen Therapy SpO2: 100 % O2 Device: Not Delivered   Pain: Pain Assessment Pain Assessment: No/denies pain   ADL: ADL ADL Comments: see functional navigator   See Function Navigator for Current Functional Status.   Therapy/Group: Individual Therapy  [place] 03/24/2017, 3:30 PM

## 2017-03-25 ENCOUNTER — Inpatient Hospital Stay (HOSPITAL_COMMUNITY): Payer: Medicare Other | Admitting: Physical Therapy

## 2017-03-25 ENCOUNTER — Inpatient Hospital Stay (HOSPITAL_COMMUNITY): Payer: Medicare Other

## 2017-03-25 LAB — GLUCOSE, CAPILLARY
Glucose-Capillary: 239 mg/dL — ABNORMAL HIGH (ref 65–99)
Glucose-Capillary: 264 mg/dL — ABNORMAL HIGH (ref 65–99)
Glucose-Capillary: 116 mg/dL — ABNORMAL HIGH (ref 65–99)
Glucose-Capillary: 137 mg/dL — ABNORMAL HIGH (ref 65–99)

## 2017-03-25 NOTE — Progress Notes (Signed)
Physical Therapy Session Note  Patient Details  Name: Jared [name] MRN: [id number] Date of Birth: 1956-10-17  Today's Date: 03/25/2017 PT Individual Time: 1500-1530 PT Individual Time Calculation (min): 30 min   Short Term Goals: Week 1:  PT Short Term Goal 1 (Week 1): = LTGs due to ELOS  Skilled Therapeutic Interventions/Progress Updates:  Pt was seen bedside in the pm. Pt performed bed mobility with no assist and increased time. Pt performed all transfers with S. Pt ambulated 150 feet x 2 with no assistive device and S. Pt ascended/descended 18 stairs with 1 rail and c/s to min guard. Pt rode Nu-step level 3 for 2 minutes and level 2 for 8 minutes with no rest breaks. Pt returned to room and left sitting up in bed with call bell within reach.    Therapy Documentation Precautions:  Precautions Precautions: Fall Precaution Comments: PD with port Restrictions Weight Bearing Restrictions: No General:   Pain: No c/o pain.   See Function Navigator for Current Functional Status.   Therapy/Group: Individual Therapy  Dub Amis 03/25/2017, 3:36 PM

## 2017-03-25 NOTE — Progress Notes (Signed)
Occupational Therapy Session Note  Patient Details  Name: Jared [name] MRN: [id number] Date of Birth: 1956/02/19  Today's Date: 03/25/2017 OT Individual Time: 1300-1357 OT Individual Time Calculation (min): 57 min    Short Term Goals: Week 1:  OT Short Term Goal 1 (Week 1): STG=LTG due to short anticipated LOS  Skilled Therapeutic Interventions/Progress Updates:    1:1. No c/o pain. Pt has friend present throughout session. Pt decline shower this session, however may want to complete bathe and dress next session. Focus of session functional mobility, sit to stand and balance. Pt dons socks and shoes and ambulates throughout session with supervision with VC for safety awareness of surroundings and pathfinding. Pt stands to play various Wii games (target shooting and bowling). Pt stands ~35 min with intermittant seated rest breaks. During bowling game pt stands on pliable foam pad to challenge balance with VC for weight shifting as pt frequently shifts weight too far back on heels. Pt requires MOD VC for technique/coordination of Wii remote to "release" virtual bowling ball. Pt assists unplugging power cord, winding it up, pushing TV cart and unlocking door with key provided by OT with increased time and supervision/VC for scanning for appropriate storage room. Pt verbalizes frustration with busy Sunday schedule since he has "lots of friends coming to visit" and it is his "day of rest." Explained scheduling process and encouraged CIR participation. Exited session with pt seated in bed with call light in reach and friend present.  Therapy Documentation Precautions:  Precautions Precautions: Fall Precaution Comments: PD with port Restrictions Weight Bearing Restrictions: No General:   Vital Signs: Therapy Vitals Temp: 98.9 F (37.2 C) Temp Source: Oral Pulse Rate: 74 Resp: 18 BP: 128/73 Patient Position (if appropriate): Lying Oxygen Therapy SpO2: 98 % O2 Device: Not Delivered  See  Function Navigator for Current Functional Status.   Therapy/Group: Individual Therapy  Tonny Branch 03/25/2017, 3:02 PM

## 2017-03-25 NOTE — Progress Notes (Signed)
Occupational Therapy Session Note  Patient Details  Name: Jared [name] MRN: [id number] Date of Birth: 1956-06-12  Today's Date: 03/25/2017 OT Individual Time: 1701-1743 OT Individual Time Calculation (min): 42 min    Short Term Goals: Week 1:  OT Short Term Goal 1 (Week 1): STG=LTG due to short anticipated LOS  Skilled Therapeutic Interventions/Progress Updates:    1:1. No c/o pain. Pt dons shirt, socks and shoes with supervision. Pt again reporting unhappy that he has therapy on Sunday, but with encouragement from family present in room pt willing to participate. Pt ambulates throughout session with supervision and VC for safety awareness. Pt reaches midline for horseshoes elevated above head level and pitches to target with BUE with supervision and VC for posture. Pt retrieve horseshoes from ground with supervision and increased time. To work on cognition, visual-spatial skills, and organization, Pt assembles PVC pipe tree figure with significantly increased time and demo difficulty eliminating pieces that do not fit, trying to make them fit repeatedly. With question cueing, pt able to identify which pieces he still needs to complete figure. Exited session with pt seated in bed with call light in reach and all needs met.   Therapy Documentation Precautions:  Precautions Precautions: Fall Precaution Comments: PD with port Restrictions Weight Bearing Restrictions: No See Function Navigator for Current Functional Status.   Therapy/Group: Individual Therapy  Tonny Branch 03/25/2017, 6:10 PM

## 2017-03-25 NOTE — Progress Notes (Signed)
Patient expressed that he was very unhappy about having therapy on Sunday.  He stated "Sunday is my day of rest and when I have visitors, some that come a long way to see me, I so not want to have therapy on Sunday in the future".

## 2017-03-25 NOTE — Progress Notes (Addendum)
Physical Therapy Session Note  Patient Details  Name: Jared [name] MRN: [id number] Date of Birth: [birth year]  Today's Date: 03/25/2017 PT Individual Time: 0915-1011 PT Individual Time Calculation (min): 56 min  Short Term Goals: Week 1:  PT Short Term Goal 1 (Week 1): = LTGs due to ELOS  Skilled Therapeutic Interventions/Progress Updates:  Pt received in bed & agreeable to tx, noting pain only in L thumb but wears brace for support. Pt transferred supine>sitting EOB with mod I & donned clothing with set up assist. Pt's girlfriend [name]) present in room & very eager to assist pt with ADL's; therapist educated pt & [name] on need to allow pt to attempt tasks himself. Pt stood at sink for ~5 minutes to perform grooming tasks (wash face, brush hair). Pt then signed himself out at nurses station without assistance to recall date/time. Pt ambulated room<>outside north tower with moderate cuing to utilize signs and for path finding. Pt required 2 rest breaks to ambulate to [facility] tower 2/2 fatigue but none on way back to room. Back on unit pt able to recall need to sign himself back in with questioning cuing. Once back in room therapist observed [name] assisting pt bed<>bathroom via ambulation without AD & checked her off to assist him to bathroom; RN made aware. At end of session pt left sitting on EOB with [name] present to supervise.   Therapy Documentation Precautions:  Precautions Precautions: Fall Precaution Comments: PD with port Restrictions Weight Bearing Restrictions: No G   See Function Navigator for Current Functional Status.   Therapy/Group: Individual Therapy  [name] 03/25/2017, 10:38 AM

## 2017-03-25 NOTE — Progress Notes (Signed)
Patient ID: Jared [name], male   DOB: 1956/06/21, 61 y.o.   MRN: [id number]  [facility] Progress Note   Assessment/ Plan:   1. 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 His mental status is now back to baseline and he continues to undergo strengthening in CIR.  2. ESRD: Continue cyclic peritoneal dialysis at the current prescription--- serial improvement of BUN noted since admission with ongoing PD. Euvolemic on exam and without concerning signs or symptoms.  3. Anemia: His hemoglobin levels remain low without any overt loss, given ESA yesterday 4. CKD-MBD: slowly improving phosphorus levels, on sevelamer 5. Hypertension: Blood pressures under good control at this time.  Subjective:   Report some pain over left thumb-suspected over, splint ordered by Dr.Kirsteins.  Objective:   BP (!) 155/60   Pulse 82   Temp 99.4 F (37.4 C) (Oral)   Resp 18   Ht 5' 9.6" (1.768 m)   Wt 71.6 kg (157 lb 13.6 oz)   SpO2 97%   BMI 22.91 kg/m   Physical Exam: Gen: Resting comfortably in bed, Being prepared for physical therapy CVS: Pulse regular rhythm, normal rate Resp: Clear to auscultation, no rales Abd: Soft, flat, PD catheter in situ Ext: No lower extremity edema  Labs: BMET  Recent Labs Lab 03/20/17 0341 03/21/17 0740  NA 132* 129*  K 4.1 3.8  CL 93* 92*  CO2 26 25  GLUCOSE 239* 321*  BUN 46* 42*  CREATININE 9.29* 9.18*  CALCIUM 8.0* 7.8*  PHOS 8.0* 6.6*   CBC  Recent Labs Lab 03/19/17 0250 03/20/17 0341 03/21/17 0740  WBC 7.4 7.5 6.2  NEUTROABS 5.7 6.0  --   HGB 8.9* 8.7* 8.4*  HCT 26.8* 26.3* 24.6*  MCV 81.0 80.7 81.2  PLT 201 252 284   Medications:    . benzonatate  100 mg Oral TID  . calcium carbonate  1 tablet Oral Q6H  . [START ON 03/28/2017] darbepoetin (ARANESP) injection - NON-DIALYSIS  100 mcg Subcutaneous Q Wed-1800  . gentamicin cream   Topical TID  . heparin   5,000 Units Subcutaneous Q8H  . hydrALAZINE  100 mg Oral TID  . insulin glargine  10 Units Subcutaneous BID  . isosorbide mononitrate  30 mg Oral Daily  . levETIRAcetam  250 mg Oral BID  . loratadine  10 mg Oral Daily  . metoprolol tartrate  100 mg Oral BID  . NIFEdipine  90 mg Oral Daily  . pantoprazole  40 mg Oral Q0600  . sevelamer carbonate  2,400 mg Oral TID WC   Elmarie Shiley, MD 03/25/2017, 9:46 AM

## 2017-03-25 NOTE — Progress Notes (Signed)
Orthopedic Tech Progress Note Patient Details:  Jared [name] 1956/01/16 [id number]  Ortho Devices Type of Ortho Device: Thumb velcro splint Ortho Device/Splint Location: lue Ortho Device/Splint Interventions: Application   Freyja Govea 03/25/2017, 9:16 AM

## 2017-03-25 NOTE — Progress Notes (Signed)
[facility] PHYSICAL MEDICINE & REHABILITATION     PROGRESS NOTE    Subjective/Complaints: visitng with daughter no c/os  ROS: pt denies nausea, vomiting, diarrhea, cough, shortness of breath or chest pain    Objective: Vital Signs: Blood pressure (!) 155/60, pulse 82, temperature 99.4 F (37.4 C), temperature source Oral, resp. rate 18, height 5' 9.6" (1.768 m), weight 71.6 kg (157 lb 13.6 oz), SpO2 97 %. No results found. No results for input(s): WBC, HGB, HCT, PLT in the last 72 hours. No results for input(s): NA, K, CL, GLUCOSE, BUN, CREATININE, CALCIUM in the last 72 hours.  Invalid input(s): CO CBG (last 3)   Recent Labs  03/24/17 1652 03/24/17 2045 03/25/17 0638  GLUCAP 84 159* 239*    Wt Readings from Last 3 Encounters:  03/25/17 71.6 kg (157 lb 13.6 oz)  03/22/17 77 kg (169 lb 12.1 oz)  03/20/17 76.2 kg (167 lb 15.9 oz)    Physical Exam:  Constitutional: He appears well-developed and well-nourished.  HENT:  Head: Normocephalic and atraumatic.  Eyes: PERRL Neck: Normal range of motion. Neck supple. No thyromegaly present.  Cardiovascular: RRR  Respiratory: CTA B. No coughing. No wheezes, rales, rhonchi GI: Soft. Bowel sounds are normal. He exhibits no distension. There is no tenderness.  Musculoskeletal: He exhibits tr to 1+ edema right forearm, vein prominent/swollen/sl tender to touch.  Neurological: He is alert.  Easily aroused.   A&Ox2 Followed simple commands.  Motor: B/l UE 4/5 proximal to distal B/l LE: 4+/5 proximal to distal  Skin: Skin is warm and dry.  Psychiatric: His affect is blunt. His speech is delayed. He is slowed  Assessment/Plan: 1. Functional and cognitive deficits secondary to encephalopathy/seizure which require 3+ hours per day of interdisciplinary therapy in a comprehensive inpatient rehab setting. Physiatrist is providing close team supervision and 24 hour management of active medical problems listed below. Physiatrist and  rehab team continue to assess barriers to discharge/monitor patient progress toward functional and medical goals.  Function:  Bathing Bathing position   Position: Shower  Bathing parts Body parts bathed by patient: Right arm, Left arm, Chest, Abdomen, Front perineal area, Right upper leg, Left upper leg, Right lower leg, Left lower leg, Buttocks Body parts bathed by helper: Back  Bathing assist Assist Level: Supervision or verbal cues      Upper Body Dressing/Undressing Upper body dressing   What is the patient wearing?: Pull over shirt/dress     Pull over shirt/dress - Perfomed by patient: Thread/unthread right sleeve, Thread/unthread left sleeve, Put head through opening, Pull shirt over trunk          Upper body assist Assist Level: Supervision or verbal cues   Set up : To obtain clothing/put away  Lower Body Dressing/Undressing Lower body dressing   What is the patient wearing?: Underwear, Pants, Non-skid slipper socks Underwear - Performed by patient: Thread/unthread right underwear leg, Thread/unthread left underwear leg, Pull underwear up/down   Pants- Performed by patient: Thread/unthread right pants leg, Thread/unthread left pants leg, Pull pants up/down   Non-skid slipper socks- Performed by patient: Don/doff right sock, Don/doff left sock   Socks - Performed by patient: Don/doff right sock, Don/doff left sock   Shoes - Performed by patient: Don/doff right shoe, Don/doff left shoe, Fasten right, Fasten left         TED Hose - Performed by helper: Don/doff right TED hose, Don/doff left TED hose  Lower body assist Assist for lower body dressing: Supervision or verbal  cues      Toileting Toileting   Toileting steps completed by patient: Adjust clothing prior to toileting, Performs perineal hygiene, Adjust clothing after toileting   Toileting Assistive Devices: Grab bar or rail  Toileting assist Assist level: Touching or steadying assistance (Pt.75%)    Transfers Chair/bed transfer   Chair/bed transfer method: Stand pivot Chair/bed transfer assist level: Touching or steadying assistance (Pt > 75%) Chair/bed transfer assistive device: Armrests     Locomotion Ambulation     Max distance: 300 Assist level: Touching or steadying assistance (Pt > 75%)   Wheelchair     Max wheelchair distance: 50 Assist Level: Touching or steadying assistance (Pt > 75%)  Cognition Comprehension Comprehension assist level: Understands basic 90% of the time/cues < 10% of the time  Expression Expression assist level: Expresses basic 90% of the time/requires cueing < 10% of the time.  Social Interaction Social Interaction assist level: Interacts appropriately 90% of the time - Needs monitoring or encouragement for participation or interaction.  Problem Solving Problem solving assist level: Solves basic 50 - 74% of the time/requires cueing 25 - 49% of the time  Memory Memory assist level: Recognizes or recalls 25 - 49% of the time/requires cueing 50 - 75% of the time   Medical Problem List and Plan: 1.  Decreased functional mobility, slurred speech secondary to metabolic encephalopathy/fever/ hyperglycemia  -PT, OT, SLP  2.  DVT Prophylaxis/Anticoagulation: Subcutaneous heparin. Monitor platelet counts of any signs of bleeding 3. Pain Management: Tylenol as needed 4. Mood: Ativan as needed 5. Neuropsych: This patient is capable of making decisions on his own behalf. 6. Skin/Wound Care: Routine skin checks 7. Fluids/Electrolytes/Nutrition: Routine I&O with follow-up chemistries per renal/according to PD requirements 8. ESRD. Hemodialysis as per renal services 9. Hypertension. Hydralazine 100 mg 3 times a day, Lopressor 100 mg twice a day, Imdur 15 mg daily, nifedipine 90 mg daily. Monitor with increased mobility- good range currently Vitals:   03/25/17 0500 03/25/17 0748  BP: (!) 159/59 (!) 155/60  Pulse: 85 82  Resp: 18   Temp: 99.4 F (37.4 C)     10. Fever. Blood cultures negative. Follow-up infectious disease. Empiric antibiotics discontinued  -afebrile 11. Seizure disorder. Keppra 250 mg twice a day, no recent sz 12. Diabetes mellitus and peripheral neuropathy. Hemoglobin A1c 8.5. Lantus insulin 8 units BID  - CBG (last 3)   Recent Labs  03/24/17 1652 03/24/17 2045 03/25/17 0638  GLUCAP 84 159* 239*     -cover with novolog insulin now  -increase lantus to 10 units bid- am CBG on high side but all others within range or borderline low 13. History of prostate cancer. Follow-up outpatient Lupron injections with urology services. 14. Acute on chronic anemia. Aranesp 15. Chronic dry cough--tessalon added today.   -lungs clear. Appears euvolemic  -no obvious medications that could be contributing   -?reflux 16. Mild thrombophlebitis Right forearm---  -moist heat, elevation, and observe  -prn tylenol   LOS (Days) 4 A FACE TO FACE EVALUATION WAS PERFORMED  Charlett Blake, MD 03/25/2017 8:31 AM

## 2017-03-26 ENCOUNTER — Inpatient Hospital Stay (HOSPITAL_COMMUNITY): Payer: Medicare Other | Admitting: Occupational Therapy

## 2017-03-26 ENCOUNTER — Inpatient Hospital Stay (HOSPITAL_COMMUNITY): Payer: Medicare Other | Admitting: Physical Therapy

## 2017-03-26 ENCOUNTER — Inpatient Hospital Stay (HOSPITAL_COMMUNITY): Payer: Medicare Other | Admitting: Speech Pathology

## 2017-03-26 ENCOUNTER — Ambulatory Visit: Payer: Medicare Other | Admitting: Nurse Practitioner

## 2017-03-26 DIAGNOSIS — N186 End stage renal disease: Secondary | ICD-10-CM | POA: Diagnosis not present

## 2017-03-26 DIAGNOSIS — I129 Hypertensive chronic kidney disease with stage 1 through stage 4 chronic kidney disease, or unspecified chronic kidney disease: Secondary | ICD-10-CM | POA: Diagnosis not present

## 2017-03-26 DIAGNOSIS — Z992 Dependence on renal dialysis: Secondary | ICD-10-CM | POA: Diagnosis not present

## 2017-03-26 LAB — IRON AND TIBC
Iron: 45 ug/dL (ref 45–182)
SATURATION RATIOS: 23 % (ref 17.9–39.5)
TIBC: 193 ug/dL — ABNORMAL LOW (ref 250–450)
UIBC: 148 ug/dL

## 2017-03-26 LAB — GLUCOSE, CAPILLARY
GLUCOSE-CAPILLARY: 78 mg/dL (ref 65–99)
GLUCOSE-CAPILLARY: 281 mg/dL — AB (ref 65–99)
Glucose-Capillary: 151 mg/dL — ABNORMAL HIGH (ref 65–99)
Glucose-Capillary: 93 mg/dL (ref 65–99)

## 2017-03-26 MED ORDER — DELFLEX-LC/1.5% DEXTROSE 344 MOSM/L IP SOLN: INTRAPERITONEAL | Status: DC

## 2017-03-26 MED ORDER — BENZONATATE 100 MG PO CAPS
100.0000 mg | ORAL_CAPSULE | Freq: Three times a day (TID) | ORAL | Status: DC
  Administered 2017-03-26 – 2017-03-27 (×5): 100 mg via ORAL
  Filled 2017-03-26 (×5): qty 1

## 2017-03-26 MED ORDER — GENTAMICIN SULFATE 0.1 % EX CREA: 1.0000 "application " | TOPICAL_CREAM | Freq: Every day | CUTANEOUS | Status: DC

## 2017-03-26 MED ORDER — HEPARIN 1000 UNIT/ML FOR PERITONEAL DIALYSIS: 500.0000 [IU] | INTRAMUSCULAR | Status: DC | PRN

## 2017-03-26 MED ORDER — HEPARIN 1000 UNIT/ML FOR PERITONEAL DIALYSIS
INTRAPERITONEAL | Status: DC | PRN
  Filled 2017-03-26: qty 5000

## 2017-03-26 MED ORDER — GENTAMICIN SULFATE 0.1 % EX CREA
1.0000 "application " | TOPICAL_CREAM | Freq: Every day | CUTANEOUS | Status: DC
  Administered 2017-03-27: 1 via TOPICAL
  Filled 2017-03-26: qty 15

## 2017-03-26 NOTE — Plan of Care (Signed)
Problem: RH Floor Transfers Goal: LTG Patient will perform floor transfers w/assist (PT) LTG: Patient will perform floor transfers with assistance (PT).  Outcome: Not Met (add Reason) Not attempted during sessions  Problem: RH Ambulation Goal: LTG Patient will ambulate in controlled environment (PT) LTG: Patient will ambulate in a controlled environment, # of feet with assistance (PT).  Outcome: Completed/Met Date Met: 03/26/17 150 ft without AD Goal: LTG Patient will ambulate in home environment (PT) LTG: Patient will ambulate in home environment, # of feet with assistance (PT).  Outcome: Completed/Met Date Met: 03/26/17 50 ft without AD Goal: LTG Patient will ambulate in community environment (PT) LTG: Patient will ambulate in community environment, # of feet with assistance (PT).  Outcome: Completed/Met Date Met: 03/26/17 150 ft without AD  Problem: RH Wheelchair Mobility Goal: LTG Patient will propel w/c in controlled environment (PT) LTG: Patient will propel wheelchair in controlled environment, # of feet with assist (PT)  Outcome: Adequate for Discharge Pt to d/c at ambulatory level  Problem: RH Stairs Goal: LTG Patient will ambulate up and down stairs w/assist (PT) LTG: Patient will ambulate up and down # of stairs with assistance (PT)  Outcome: Completed/Met Date Met: 03/26/17 24 steps with single rail

## 2017-03-26 NOTE — Progress Notes (Signed)
Speech Language Pathology Daily Session Note  Patient Details  Name: Jared [name] MRN: [id number] Date of Birth: 1956-06-07  Today's Date: 03/26/2017 SLP Individual Time: 1030-1100 SLP Individual Time Calculation (min): 30 min  Short Term Goals: Week 1: SLP Short Term Goal 1 (Week 1): Patient will consume current diet with minimal overt s/s of aspiration with Mod I.  SLP Short Term Goal 2 (Week 1): Patient will recall new, daily information with Mod A verbal and visual cues. SLP Short Term Goal 3 (Week 1): Patient will demonstrate functional problem solving for basic and familiar tasks with Min A verbal cues.  SLP Short Term Goal 4 (Week 1): Patient will self-monitor and correct verbal errors during informal and structured tasks with supervision verbal cues.  SLP Short Term Goal 5 (Week 1): Patient will follow multi-step commands with Min A verbal and visual cues.   Skilled Therapeutic Interventions: Skilled treatment session focused on dysphagia and cognitive goals. Patient is currently on a Dys. 3 texture diet (upgraded over weekend by RN). Therefore, SLP provided skilled observation with trials of Dys. 3 textures. Patient demonstrated efficient mastication with complete oral clearance, recommend patient continue current diet. Patient's family present and educated on patient's current cognitive impairments and strategies to utilize at home to maximize safety. All verbalized understanding but suspect patient will need reinforcement. Patient left upright in wheelchair with family present. Continue with current plan of care.      Function:  Eating Eating   Modified Consistency Diet: Yes Eating Assist Level: Swallowing techniques: self managed   Eating Set Up Assist For: Opening containers       Cognition Comprehension Comprehension assist level: Understands basic 90% of the time/cues < 10% of the time  Expression   Expression assist level: Expresses basic 90% of the time/requires  cueing < 10% of the time.  Social Interaction Social Interaction assist level: Interacts appropriately 90% of the time - Needs monitoring or encouragement for participation or interaction.  Problem Solving Problem solving assist level: Solves basic 75 - 89% of the time/requires cueing 10 - 24% of the time  Memory Memory assist level: Recognizes or recalls 25 - 49% of the time/requires cueing 50 - 75% of the time    Pain No/Denies Pain   Therapy/Group: Individual Therapy  Delesha Pohlman 03/26/2017, 3:48 PM

## 2017-03-26 NOTE — Discharge Summary (Signed)
Discharge summary job # [id number]

## 2017-03-26 NOTE — Progress Notes (Signed)
[facility] PHYSICAL MEDICINE & REHABILITATION     PROGRESS NOTE    Subjective/Complaints: Just waking up. No new complaints.   ROS: pt denies nausea, vomiting, diarrhea, cough, shortness of breath or chest pain     Objective: Vital Signs: Blood pressure (!) 142/76, pulse 79, temperature 98.9 F (37.2 C), temperature source Oral, resp. rate 18, height 5' 9.6" (1.768 m), weight 68.8 kg (151 lb 10.8 oz), SpO2 100 %. No results found. No results for input(s): WBC, HGB, HCT, PLT in the last 72 hours. No results for input(s): NA, K, CL, GLUCOSE, BUN, CREATININE, CALCIUM in the last 72 hours.  Invalid input(s): CO CBG (last 3)   Recent Labs  03/25/17 1642 03/25/17 2107 03/26/17 0636  GLUCAP 116* 137* 151*    Wt Readings from Last 3 Encounters:  03/26/17 68.8 kg (151 lb 10.8 oz)  03/22/17 77 kg (169 lb 12.1 oz)  03/20/17 76.2 kg (167 lb 15.9 oz)    Physical Exam:  Constitutional: He appears well-developed and well-nourished.  HENT:  Head: Normocephalic and atraumatic.  Eyes: PERRL Neck: Normal range of motion. Neck supple. No thyromegaly present.  Cardiovascular: RRR  Respiratory: CTA B GI: Soft. Bowel sounds are normal. He exhibits no distension. There is no tenderness.  Musculoskeletal: He exhibits tr to 1+ edema right forearm, vein prominent/swollen/sl tender to touch.  Neurological: He is alert.    A&Ox2 Followed simple commands.  Motor: B/l UE 4/5 proximal to distal B/l LE: 4+/5 proximal to distal  Skin: Skin is warm and dry.  Psychiatric: His affect is blunt. His speech is delayed. He is slowed  Assessment/Plan: 1. Functional and cognitive deficits secondary to encephalopathy/seizure which require 3+ hours per day of interdisciplinary therapy in a comprehensive inpatient rehab setting. Physiatrist is providing close team supervision and 24 hour management of active medical problems listed below. Physiatrist and rehab team continue to assess barriers to  discharge/monitor patient progress toward functional and medical goals.  Function:  Bathing Bathing position   Position: Shower  Bathing parts Body parts bathed by patient: Right arm, Left arm, Chest, Abdomen, Front perineal area, Right upper leg, Left upper leg, Right lower leg, Left lower leg, Buttocks Body parts bathed by helper: Back  Bathing assist Assist Level: Supervision or verbal cues      Upper Body Dressing/Undressing Upper body dressing   What is the patient wearing?: Pull over shirt/dress     Pull over shirt/dress - Perfomed by patient: Thread/unthread right sleeve, Thread/unthread left sleeve, Put head through opening, Pull shirt over trunk          Upper body assist Assist Level: Supervision or verbal cues   Set up : To obtain clothing/put away  Lower Body Dressing/Undressing Lower body dressing   What is the patient wearing?: Non-skid slipper socks, Shoes Underwear - Performed by patient: Thread/unthread right underwear leg, Thread/unthread left underwear leg, Pull underwear up/down   Pants- Performed by patient: Thread/unthread right pants leg, Thread/unthread left pants leg, Pull pants up/down   Non-skid slipper socks- Performed by patient: Don/doff right sock, Don/doff left sock   Socks - Performed by patient: Don/doff right sock, Don/doff left sock   Shoes - Performed by patient: Don/doff right shoe, Don/doff left shoe, Fasten right, Fasten left         TED Hose - Performed by helper: Don/doff right TED hose, Don/doff left TED hose  Lower body assist Assist for lower body dressing: Supervision or verbal cues  Toileting Toileting   Toileting steps completed by patient: Adjust clothing prior to toileting, Performs perineal hygiene, Adjust clothing after toileting   Toileting Assistive Devices: Grab bar or rail  Toileting assist Assist level: Touching or steadying assistance (Pt.75%)   Transfers Chair/bed transfer   Chair/bed transfer method:  Stand pivot Chair/bed transfer assist level: Supervision or verbal cues Chair/bed transfer assistive device: Armrests     Locomotion Ambulation     Max distance: 150 Assist level: Supervision or verbal cues   Wheelchair     Max wheelchair distance: 50 Assist Level: Touching or steadying assistance (Pt > 75%)  Cognition Comprehension Comprehension assist level: Understands basic 90% of the time/cues < 10% of the time  Expression Expression assist level: Expresses basic 90% of the time/requires cueing < 10% of the time.  Social Interaction Social Interaction assist level: Interacts appropriately 90% of the time - Needs monitoring or encouragement for participation or interaction.  Problem Solving Problem solving assist level: Solves basic 50 - 74% of the time/requires cueing 25 - 49% of the time  Memory Memory assist level: Recognizes or recalls 25 - 49% of the time/requires cueing 50 - 75% of the time   Medical Problem List and Plan: 1.  Decreased functional mobility, slurred speech secondary to metabolic encephalopathy/fever/ hyperglycemia  -PT, OT, SLP  2.  DVT Prophylaxis/Anticoagulation: Subcutaneous heparin. Monitor platelet counts of any signs of bleeding 3. Pain Management: Tylenol as needed 4. Mood: Ativan as needed 5. Neuropsych: This patient is capable of making decisions on his own behalf. 6. Skin/Wound Care: Routine skin checks 7. Fluids/Electrolytes/Nutrition: Routine I&O with follow-up chemistries per renal/according to PD requirements 8. ESRD. Hemodialysis as per renal services 9. Hypertension. Hydralazine 100 mg 3 times a day, Lopressor 100 mg twice a day, Imdur 15 mg daily, nifedipine 90 mg daily. Monitor with increased mobility- good range currently Vitals:   03/26/17 0715 03/26/17 0835  BP: (!) 152/123 (!) 142/76  Pulse: 79   Resp:    Temp: 98.9 F (37.2 C)    10. Fever. Blood cultures negative. Follow-up infectious disease. Empiric antibiotics  discontinued  -afebrile 11. Seizure disorder. Keppra 250 mg twice a day, no recent sz 12. Diabetes mellitus and peripheral neuropathy. Hemoglobin A1c 8.5. Lantus insulin 8 units BID  - CBG (last 3)   Recent Labs  03/25/17 1642 03/25/17 2107 03/26/17 0636  GLUCAP 116* 137* 151*     -cover with novolog insulin now  -increased lantus to 10 units bid- with better control overall 13. History of prostate cancer. Follow-up outpatient Lupron injections with urology services. 14. Acute on chronic anemia. Aranesp 15. Chronic dry cough--tessalon added    -lungs clear. Appears euvolemic  -no obvious medications that could be contributing   -?reflux 16. Mild thrombophlebitis Right forearm---improving  -moist heat, elevation, and observe  -prn tylenol   LOS (Days) 5 A FACE TO FACE EVALUATION WAS PERFORMED  [name], MD 03/26/2017 9:08 AM

## 2017-03-26 NOTE — Progress Notes (Signed)
Social Work Patient ID: Jared [name], male   DOB: [birth year], 61 y.o.   MRN: [id number]   Alerted by therapies today that pt is reaching his supervision goals.  MD and tx team agreeable pt ready for d/c tomorrow.  Discussed with pt and two sisters in the room who are aware and agreeable.  Also, discussed with pt the importance that he allow his sisters to provide supervision, oversight for medication and diet management and medical follow up appointments.  He stated that he is willing to allow them to be more involved.  Have referred for [facility] follow up and no DME needs.  Have also alerted renal unit of his d/c.    Aki Burdin, LCSW

## 2017-03-26 NOTE — Discharge Summary (Signed)
NAMEJIMY, Jared [name] NO.:  [id number]  MEDICAL RECORD NO.:  [id number]  LOCATION:  4W20C                        FACILITY:  [facility]  PHYSICIAN:  [name], M.D.DATE OF BIRTH:  1955/12/30  DATE OF ADMISSION:  03/21/2017 DATE OF DISCHARGE:  03/27/2017                              DISCHARGE SUMMARY   DISCHARGE DIAGNOSES: 1. Decreased functional mobility secondary to metabolic     encephalopathy. 2. Subcutaneous heparin for deep venous thrombosis prophylaxis. 3. Pain management. 4. Anxiety. 5. End-stage renal disease, on peritoneal dialysis. 6. Hypertension. 7. Fever, resolved. 8. Seizure disorder. 9. Diabetes mellitus with poor medical compliance. 10.Peripheral neuropathy. 11.History of prostate cancer. 12.Acute on chronic anemia. 13.Chronic dry cough, resolved. 14.Mild thrombophlebitis, right forearm.  HISTORY OF PRESENT ILLNESS:  This is a 61 year old, right-handed male with history of end-stage renal disease, peritoneal dialysis, chronic cough, hypertension, diabetes mellitus, prostate cancer with Lupron injections, follows as an outpatient, and prior seizure disorder.  Well known to Rehab Services from admission in January, 2018, for encephalopathy due to seizure.  Lives with sister and brother-in-law. Independent prior to admission.  He does his own peritoneal dialysis. Presented on March 13, 2017, with altered mental status, slurred speech. Denied any recent trauma.  Glucose 1127 on admission, lactic acid 1.35, sodium 122, potassium 5.6, creatinine 9.8.  Troponin negative.  MRI of the brain showed to be unremarkable, no acute process.  MRA negative. CT of the abdomen showed moderate volume of ascites, stable as compared to January 2018.  EEG negative for seizure.  Low-grade fever. Infectious Disease consulted.  Blood cultures negative.  Etiology of syndrome, fever unclear.  In regard to fever, maintained on empiric antibiotics, later  discontinued.  Followup with Neurology Services, suspect acute encephalopathy.  Renal Service ongoing for dialysis as advised.  Subcutaneous heparin for DVT prophylaxis.  Maintained on a dysphagia #2 thin liquid diet.  Physical and occupational therapy ongoing.  The patient was admitted for a comprehensive rehab program.  PAST MEDICAL HISTORY:  See discharge diagnoses.  SOCIAL HISTORY:  Lives with sister and brother-in-law.  Independent prior to admission.  Functional status upon admission to [facility] was minimal assist 15 feet rolling walker, needing assistance for safety, minimal assist sit to stand, total assist upper and lower body dressing, bathing.  PHYSICAL EXAMINATION:  VITAL SIGNS:  Blood pressure 135/74, pulse 78, temperature 98, respirations 19. GENERAL:  This was an alert male, in no acute distress.  Followed simple commands.  Oriented to person and place. HEENT:  EOMs intact. NECK:  Supple.  Nontender.  No JVD. CARDIAC:  Regular rate and rhythm.  No murmur. ABDOMEN:  Soft, nontender.  Good bowel sounds.  REHABILITATION HOSPITAL COURSE:  The patient was admitted to Inpatient Rehab Services with therapies initiated on a 3-hour daily basis consisting of physical therapy, occupational therapy, and rehabilitation nursing as well as speech therapy.  The following issues were addressed during the patient's rehab stay.  Pertaining to Mr. Mastel's metabolic encephalopathy, he continued to progress nicely in therapies, making very progressive gains. His diabetes was monitored closely he did have some increased variables received full diabetic education and referrals made for outpatient endocrinology services. We did  discuss with family at length the need to be compliant with diet they have stated patient noncompliant in the past. Subcutaneous heparin for DVT prophylaxis. Peritoneal dialysis ongoing as per Renal Services.  Blood pressure is well controlled, monitored when out  of bed.  Fever had resolved.  Blood cultures negative.  Empiric antibiotics discontinued.  He remained on Keppra for seizure disorder.  He would follow up outpatient for his history of prostate cancer, with Lupron injections as advised.  Chronic cough, Tessalon added with good results.  Mild thrombophlebitis, right forearm, improved with moist, heat, elevation, observation, he was using only Tylenol.  The patient received weekly collaborative interdisciplinary team conferences to discuss estimated length of stay, family teaching, any barriers to his discharge.  He continued to be quite eager about his discharge.  Ambulates throughout the unit distance supervision.  Progressing to modified independence.  Negotiating stairs modified independence.  He could gather his belongings for activities of daily living and homemaking.  Full family teaching was completed and plan discharge home.  DISCHARGE MEDICATIONS: 1. Tessalon 100 mg p.o. t.i.d. 2. Tums 200 mg p.o. every 6 hours. 3. Hydralazine 100 mg p.o. t.i.d. 4. Lantus insulin 10 units subcutaneous twice daily. 5. Imdur 30 mg p.o. daily. 6. Keppra 250 mg p.o. b.i.d. 7. Claritin 10 mg p.o. daily. 8. Lopressor 100 mg p.o. b.i.d. 9. Procardia 90 mg p.o. daily. 10.Protonix 40 mg p.o. daily. 11.Renvela 2400 mg p.o. t.i.d. 12.Tylenol as needed.  DIET:  Mechanical soft.  FOLLOWUP:  He would follow up with Dr. Alger Simons at the Outpatient Rehab Service office as needed; Dr. Roney Jaffe, call for appointment; Dr. Harland Dingwall, medical management.  Ambulatory referral made as an outpatient for endocrinology to establish patient for brittle diabetes  SPECIAL INSTRUCTIONS:  Continue dialysis as directed.  No driving.     Lauraine Rinne, P.A.   ______________________________ [name], M.D.    DA/MEDQ  D:  03/26/2017  T:  03/26/2017  Job:  [id number]  cc:   Sol Blazing, M.D. Dr. Harland Dingwall [name],  M.D.

## 2017-03-26 NOTE — Progress Notes (Signed)
Physical Therapy Discharge Summary  Patient Details  Name: Jared [name] MRN: [id number] Date of Birth: [birth year]  Today's Date: 03/26/2017   Patient has met 12 of 13 long term goals due to improved activity tolerance, improved balance, improved postural control, increased strength and ability to compensate for deficits.  Patient to discharge at an ambulatory level supervision/mod I without an AD.    Reasons goals not met: Pt did not meet floor transfer goal because it was not attempted during LOS while in CIR.   Recommendation:  Patient will benefit from ongoing skilled PT services in home health setting to continue to advance safe functional mobility, address ongoing impairments in decreased endurance, decreased balance, and minimize fall risk.  Equipment: No equipment provided  Reasons for discharge: treatment goals met  Patient agrees with progress made and goals achieved: Yes  PT Discharge Precautions/Restrictions Restrictions Weight Bearing Restrictions: No  Motor  Motor Motor:  (general weakness)   Mobility Bed Mobility Bed Mobility: Supine to Sit;Sit to Supine Supine to Sit: 6: Modified independent (Device/Increase time) Sit to Supine: 6: Modified independent (Device/Increase time) Transfers Transfers: Yes Sit to Stand: 6: Modified independent (Device/Increase time) Stand to Sit: 6: Modified independent (Device/Increase time)  Locomotion  Ambulation Ambulation: Yes Ambulation/Gait Assistance: 6: Modified independent (Device/Increase time) Ambulation Distance (Feet): 150 Feet Assistive device: None High Level Ambulation High Level Ambulation: Backwards walking;Side stepping Side Stepping: supervision without AD Backwards Walking: supervision without AD Stairs / Additional Locomotion Stairs: Yes Stairs Assistance: 6: Modified independent (Device/Increase time) Stair Management Technique: One rail Right Number of Stairs: 24 Height of Stairs:  (6" +  3") Ramp: 6: Modified independent (Device) (without AD) Wheelchair Mobility Wheelchair Mobility: No   Extremity Assessment  RLE Assessment RLE Assessment: Within Functional Limits LLE Assessment LLE Assessment: Within Functional Limits   See Function Navigator for Current Functional Status.  [name] 03/26/2017, 4:54 PM

## 2017-03-26 NOTE — Progress Notes (Addendum)
  [facility] Progress Note   Subjective: no c/o's. Working hard w PT  Vitals:   03/25/17 1930 03/26/17 0500 03/26/17 0715 03/26/17 0835  BP: (!) 143/123 120/82 (!) 152/123 (!) 142/76  Pulse: 78 70 79   Resp: 18 18    Temp: 98.5 F (36.9 C) 98.6 F (37 C) 98.9 F (37.2 C)   TempSrc: Oral Oral Oral   SpO2: 100% 98% 100%   Weight: 71.8 kg (158 lb 4.6 oz)  68.8 kg (151 lb 10.8 oz)   Height:        Inpatient medications: . benzonatate  100 mg Oral TID  . calcium carbonate  1 tablet Oral Q6H  . [START ON 03/28/2017] darbepoetin (ARANESP) injection - NON-DIALYSIS  100 mcg Subcutaneous Q Wed-1800  . gentamicin cream   Topical TID  . heparin  5,000 Units Subcutaneous Q8H  . hydrALAZINE  100 mg Oral TID  . insulin glargine  10 Units Subcutaneous BID  . isosorbide mononitrate  30 mg Oral Daily  . levETIRAcetam  250 mg Oral BID  . loratadine  10 mg Oral Daily  . metoprolol tartrate  100 mg Oral BID  . NIFEdipine  90 mg Oral Daily  . pantoprazole  40 mg Oral Q0600  . sevelamer carbonate  2,400 mg Oral TID WC   . dialysis solution 2.5% low-MG/low-CA     acetaminophen **OR** acetaminophen, dianeal solution for CAPD/CCPD with heparin, LORazepam, ondansetron **OR** ondansetron (ZOFRAN) IV, sorbitol  Exam: Gen: alert, no distress, working w PT CVS: Pulse regular rhythm, normal rate Resp: Clear to auscultation, no rales Abd: Soft, flat, PD catheter in situ Ext: No lower extremity edema  Dialysis: CCPD 6 exchanges/ 24 hrs, 2500 overnight and 2000 day dwell.  Dwell time 1h 20 min. Dry wt 75.5kg.  Mircera 200 ug given 3/28, next dose 50ug sched for 4/25, Venofer 250 mg scheduled for 4/25   Assessment: 1. Fever/altered mental status/deconditioning: seizure vs metabolic encephalopathy from possible viral encephalitis. His mental status is now back to baseline.  2. ESRD: Continue CCPD, volume is stable on exam. Wt's down 6kg.  3. Anemia: His hemoglobin levels remain low  without any overt loss, getting weekly darbe 100 ug. Check fe/ TIBC today.  4. CKD-MBD: slowly improving phosphorus levels, on sevelamer 5. Hypertension: Blood pressures under good control at this time.    Plan - cont CCPD as scheduled, all 1.5%    Kelly Splinter MD [facility] pager [phone]   03/26/2017, 10:54 AM    Recent Labs Lab 03/20/17 0341 03/21/17 0740  NA 132* 129*  K 4.1 3.8  CL 93* 92*  CO2 26 25  GLUCOSE 239* 321*  BUN 46* 42*  CREATININE 9.29* 9.18*  CALCIUM 8.0* 7.8*  PHOS 8.0* 6.6*    Recent Labs Lab 03/20/17 0341 03/21/17 0740  ALBUMIN 1.8* 1.7*    Recent Labs Lab 03/20/17 0341 03/21/17 0740  WBC 7.5 6.2  NEUTROABS 6.0  --   HGB 8.7* 8.4*  HCT 26.3* 24.6*  MCV 80.7 81.2  PLT 252 284   Iron/TIBC/Ferritin/ %Sat    Component Value Date/Time   IRON 78 11/28/2016 1546   TIBC 162 (L) 11/28/2016 1546   IRONPCTSAT 48 (H) 11/28/2016 1546

## 2017-03-26 NOTE — Progress Notes (Signed)
Physical Therapy Session Note  Patient Details  Name: Jared [name] MRN: [id number] Date of Birth: 1956-07-19  Today's Date: 03/26/2017 PT Individual Time: 1104-1159 PT Individual Time Calculation (min): 55 min   Short Term Goals: Week 1:  PT Short Term Goal 1 (Week 1): = LTGs due to ELOS  Skilled Therapeutic Interventions/Progress Updates:  Pt received in room & agreeable to tx. Pt very eager to d/c home as soon as possible; discussed need for pt to safely negotiate stairs without rails for home access. Pt then reports he can use front entrance to house which has 4 steps and B rails but he can only access 1 at a time. Pt also reports his sister lives with him and can provide 24 hr supervision (she only leaves for a few minutes during the day to go pick up her grandchildren). Primary therapist will follow up with primary team regarding pt's d/c date. Pt ambulated throughout unit with distant supervision progressing to mod I, negotiated 24 steps (6" + 3") with single rail and mod I. Pt completed bed mobility and car transfers with mod I. Pt then utilized nu-step up to level 6 x 12 minutes with BLE only for strengthening & endurance training. Pt engaged in pipe tree activity while standing on airex foam with activity focusing on cognitive remediation. Pt required min cuing and more than reasonable amount of time to select correct pieces and for error correction when assembling 2 moderately complex shapes. At end of session pt returned to room & was left sitting in w/c with all needs within reach & sisters present to supervise.   Therapy Documentation Precautions:  Precautions Precautions: Fall Precaution Comments: PD with port Restrictions Weight Bearing Restrictions: No  Pain: Denied c/o pain.  See Function Navigator for Current Functional Status.   Therapy/Group: Individual Therapy  [name] 03/26/2017, 12:06 PM

## 2017-03-26 NOTE — Discharge Summary (Signed)
Occupational Therapy Discharge Summary  Patient Details  Name: Jared [name] MRN: [id number] Date of Birth: [birth year]   Today's Date: 03/26/2017 OT Individual Time: 0859-1001 and 9470-9628 OT Individual Time Calculation (min): 62 min and 48 min  Patient has met 14 of 14 long term goals due to improved activity tolerance, improved balance, improved awareness and improved coordination.  Patient to discharge at overall Modified Independent level.  Patient is being discharged to sister's home where she will provide cognitive assistance during ADLs/IADLs..    All goals met.   Recommendation:  Patient would benefit from ongoing skilled OT services in home health setting, however pt currently refuses home health OT   Equipment: No equipment provided  Reasons for discharge: treatment goals met  Patient/family agrees with progress made and goals achieved: Yes   Skilled Therapeutic Interventions/Progress Updates:  Tx focus on functional ambulation, safety awareness, and ADL retraining.   Pt lying in bed with son on phone at time of arrival. With encouragement, pt agreeable to complete BADLs. Pt initiating gathering of B/D items, ambulating around room/bathroom without AD at Mod I level. Pt standing at sink to complete bathing (per preference over shower). No LOB while pt supported himself unilaterally on sink to wash LEs. Pt sitting in couch for LB dressing and footwear. Toilet transfer/tasks completed at Mod I. Afterwards he completed oral care/grooming tasks while standing at sink. Ambulated down hallway without AD to retrieve new gripper socks from [employer]. Pt getting lost, but able to pathfind his way back with extra time and min questioning cues. Once in room, sister Jared [name] arrived. Discussed home DME needs together. Pt left sitting EOB with Jared [name] present at time of departure.   2nd Session 1:1 tx (48 min) Tx focus on d/c planning, functional transfers, and UE HEP.   Pt  greeted lying in bed with family present. Pt ambulated to tub room to complete simulated tub bench transfer. Pt reports having tub bench and necessary bathroom DME for home. Also declines home health OT after we had open discussion about it. Pt ambulated to [employer], then was able to pathfind his way back to unit and room without cues! Went over bilateral UE HEP with pt able to demonstrate carryover of education with provided visual demonstrations and verbal instruction from OT. Pt left EOB at time of departure. Made him Mod I in room due to d/c tomorrow.     OT Discharge' \\Vital'$  Signs Therapy Vitals Temp: 98.5 F (36.9 C) Temp Source: Oral Pulse Rate: 84 Resp: 18 BP: 129/80 Patient Position (if appropriate): Lying Oxygen Therapy SpO2: 100 % O2 Device: Not Delivered Pain Pain Assessment Pain Assessment: No/denies pain ADL ADL ADL Comments: Please see funcctional navigator for ADL status Cognition Arousal/Alertness: Awake/alert Orientation Level: Oriented X4 Attention: Sustained Sustained Attention: Appears intact Memory: Impaired Memory Impairment: Decreased recall of new information Problem Solving: Impaired Safety/Judgment: Appears intact (during BADLs)  Motor  Motor Motor - Discharge Observations: global weakness, WFL for Mod I BADL completion Mobility  Transfers Transfers: Sit to Stand;Stand to Sit Sit to Stand: 6: Modified independent (Device/Increase time)  Balance Balance Balance Assessed: Yes Dynamic Sitting Balance Dynamic Sitting - Balance Support: During functional activity;No upper extremity supported Dynamic Sitting - Level of Assistance: 6: Modified independent (Device/Increase time) (when completing LB self care ) Dynamic Standing Balance Dynamic Standing - Balance Support: No upper extremity supported;During functional activity Dynamic Standing - Level of Assistance: 6: Modified independent (Device/Increase time) Dynamic Standing - Comments:   (  during LB self care completion) Extremity/Trunk Assessment RUE Assessment RUE Assessment: Within Functional Limits (4+/5 proximal to distal) LUE Assessment LUE Assessment: Within Functional Limits (4+/5 proximal to distal)   See Function Navigator for Current Functional Status.   Therapy/Group: Individual Therapy  Jared [name] 03/26/2017, 8:53 PM

## 2017-03-27 ENCOUNTER — Encounter: Payer: Self-pay | Admitting: Nurse Practitioner

## 2017-03-27 ENCOUNTER — Telehealth: Payer: Self-pay | Admitting: Nurse Practitioner

## 2017-03-27 DIAGNOSIS — E440 Moderate protein-calorie malnutrition: Secondary | ICD-10-CM | POA: Diagnosis not present

## 2017-03-27 DIAGNOSIS — Z79899 Other long term (current) drug therapy: Secondary | ICD-10-CM | POA: Diagnosis not present

## 2017-03-27 DIAGNOSIS — E1142 Type 2 diabetes mellitus with diabetic polyneuropathy: Secondary | ICD-10-CM

## 2017-03-27 DIAGNOSIS — D509 Iron deficiency anemia, unspecified: Secondary | ICD-10-CM | POA: Diagnosis not present

## 2017-03-27 DIAGNOSIS — D631 Anemia in chronic kidney disease: Secondary | ICD-10-CM | POA: Diagnosis not present

## 2017-03-27 DIAGNOSIS — N186 End stage renal disease: Secondary | ICD-10-CM | POA: Diagnosis not present

## 2017-03-27 DIAGNOSIS — Z5189 Encounter for other specified aftercare: Secondary | ICD-10-CM | POA: Diagnosis not present

## 2017-03-27 LAB — GLUCOSE, CAPILLARY
Glucose-Capillary: >600 mg/dL (ref 65–99)
Glucose-Capillary: >600 mg/dL (ref 65–99)
Glucose-Capillary: 355 mg/dL — ABNORMAL HIGH (ref 65–99)
Glucose-Capillary: 137 mg/dL — ABNORMAL HIGH (ref 65–99)
Glucose-Capillary: 114 mg/dL — ABNORMAL HIGH (ref 65–99)
Glucose-Capillary: 74 mg/dL (ref 65–99)

## 2017-03-27 LAB — BASIC METABOLIC PANEL
ANION GAP: 14 (ref 5–15)
Anion gap: 13 (ref 5–15)
BUN: 31 mg/dL — AB (ref 6–20)
BUN: 32 mg/dL — AB (ref 6–20)
CALCIUM: 8 mg/dL — AB (ref 8.9–10.3)
CALCIUM: 8.4 mg/dL — AB (ref 8.9–10.3)
CHLORIDE: 94 mmol/L — AB (ref 101–111)
CO2: 23 mmol/L (ref 22–32)
CO2: 25 mmol/L (ref 22–32)
CREATININE: 9.44 mg/dL — AB (ref 0.61–1.24)
CREATININE: 9.5 mg/dL — AB (ref 0.61–1.24)
Chloride: 90 mmol/L — ABNORMAL LOW (ref 101–111)
GFR calc Af Amer: 6 mL/min — ABNORMAL LOW (ref >60)
GFR calc non Af Amer: 5 mL/min — ABNORMAL LOW (ref >60)
GFR, EST AFRICAN AMERICAN: 6 mL/min — AB (ref >60)
GFR, EST NON AFRICAN AMERICAN: 5 mL/min — AB (ref >60)
GLUCOSE: 594 mg/dL — AB (ref 65–99)
GLUCOSE: 186 mg/dL — AB (ref 65–99)
Potassium: 3.4 mmol/L — ABNORMAL LOW (ref 3.5–5.1)
Potassium: 3.2 mmol/L — ABNORMAL LOW (ref 3.5–5.1)
Sodium: 127 mmol/L — ABNORMAL LOW (ref 135–145)
Sodium: 132 mmol/L — ABNORMAL LOW (ref 135–145)

## 2017-03-27 LAB — GLUCOSE, RANDOM: GLUCOSE: 738 mg/dL — AB (ref 65–99)

## 2017-03-27 MED ORDER — INSULIN ASPART 100 UNIT/ML ~~LOC~~ SOLN
15.0000 [IU] | Freq: Once | SUBCUTANEOUS | Status: AC
  Administered 2017-03-27: 15 [IU] via SUBCUTANEOUS

## 2017-03-27 MED ORDER — ISOSORBIDE MONONITRATE ER 30 MG PO TB24: 30.0000 mg | ORAL_TABLET | Freq: Every day | ORAL | 1 refills | Status: DC

## 2017-03-27 MED ORDER — ATORVASTATIN CALCIUM 20 MG PO TABS: 20.0000 mg | ORAL_TABLET | Freq: Every day | ORAL | 3 refills | Status: DC

## 2017-03-27 MED ORDER — PANTOPRAZOLE SODIUM 40 MG PO TBEC: 40.0000 mg | DELAYED_RELEASE_TABLET | Freq: Every day | ORAL | 1 refills | Status: DC

## 2017-03-27 MED ORDER — INSULIN GLARGINE 100 UNITS/ML SOLOSTAR PEN: 10.0000 [IU] | PEN_INJECTOR | Freq: Two times a day (BID) | SUBCUTANEOUS | 11 refills | Status: DC

## 2017-03-27 MED ORDER — NIFEDIPINE ER OSMOTIC RELEASE 90 MG PO TB24: 90.0000 mg | ORAL_TABLET | Freq: Every day | ORAL | 2 refills | Status: DC

## 2017-03-27 MED ORDER — METOPROLOL TARTRATE 100 MG PO TABS: 100.0000 mg | ORAL_TABLET | Freq: Two times a day (BID) | ORAL | 0 refills | Status: DC

## 2017-03-27 MED ORDER — LORATADINE 10 MG PO TABS: 10.0000 mg | ORAL_TABLET | Freq: Every day | ORAL | 1 refills | Status: DC

## 2017-03-27 MED ORDER — SEVELAMER CARBONATE 800 MG PO TABS: 2400.0000 mg | ORAL_TABLET | Freq: Three times a day (TID) | ORAL | 1 refills | Status: AC

## 2017-03-27 MED ORDER — CALCIUM CARBONATE ANTACID 500 MG PO CHEW: 1.0000 | CHEWABLE_TABLET | Freq: Four times a day (QID) | ORAL | 0 refills | Status: DC

## 2017-03-27 MED ORDER — LEVETIRACETAM 250 MG PO TABS: 250.0000 mg | ORAL_TABLET | Freq: Two times a day (BID) | ORAL | 0 refills | Status: DC

## 2017-03-27 MED ORDER — BENZONATATE 100 MG PO CAPS: 100.0000 mg | ORAL_CAPSULE | Freq: Three times a day (TID) | ORAL | 0 refills | Status: DC

## 2017-03-27 MED ORDER — HYDRALAZINE HCL 100 MG PO TABS: 100.0000 mg | ORAL_TABLET | Freq: Three times a day (TID) | ORAL | 0 refills | Status: DC

## 2017-03-27 NOTE — Significant Event (Signed)
CRITICAL VALUE ALERT  Critical value received: Blood Glucose 594  Date of notification:  03/27/2016  Time of notification:  9234  Critical value read back:Yes.    Nurse who received alert:  Dorthula Nettles, RN  MD notified (1st page):  Marlowe Shores, PA  Time of first page:  [place], [place] consulting with Dr. Naaman Plummer. Awaiting further orders.

## 2017-03-27 NOTE — Progress Notes (Signed)
Patient received 15 units Novalog per order by Dr. Naaman Plummer for blood glucose 594. BMET to be scheduled in approximately one hour for follow-up.

## 2017-03-27 NOTE — Progress Notes (Signed)
[facility] Progress Note   Subjective: going home today. BS jumped up to 700 this am, coming down after large dose of insulin  Vitals:   03/27/17 0500 03/27/17 0710 03/27/17 0824 03/27/17 1440  BP: (!) 162/76 (!) 164/88 (!) 174/86 (!) 122/58  Pulse: 83 79 80   Resp: 16 15    Temp: 98.2 F (36.8 C) 98.5 F (36.9 C)    TempSrc: Oral Oral    SpO2: 96% 99%    Weight: 70.1 kg (154 lb 8.7 oz) 68.6 kg (151 lb 3.8 oz)    Height:        Inpatient medications: . benzonatate  100 mg Oral TID  . calcium carbonate  1 tablet Oral Q6H  . [START ON 03/28/2017] darbepoetin (ARANESP) injection - NON-DIALYSIS  100 mcg Subcutaneous Q Wed-1800  . gentamicin cream  1 application Topical Daily  . gentamicin cream   Topical TID  . heparin  5,000 Units Subcutaneous Q8H  . hydrALAZINE  100 mg Oral TID  . insulin glargine  10 Units Subcutaneous BID  . isosorbide mononitrate  30 mg Oral Daily  . levETIRAcetam  250 mg Oral BID  . loratadine  10 mg Oral Daily  . metoprolol tartrate  100 mg Oral BID  . NIFEdipine  90 mg Oral Daily  . pantoprazole  40 mg Oral Q0600  . sevelamer carbonate  2,400 mg Oral TID WC   . dialysis solution 1.5% low-MG/low-CA    . dialysis solution 2.5% low-MG/low-CA     acetaminophen **OR** acetaminophen, dianeal solution for CAPD/CCPD with heparin, dianeal solution for CAPD/CCPD with heparin, LORazepam, ondansetron **OR** ondansetron (ZOFRAN) IV, sorbitol  Exam: Gen: alert, no distress, working w PT CVS: Pulse regular rhythm, normal rate Resp: Clear to auscultation, no rales Abd: Soft, flat, PD catheter in situ Ext: No lower extremity edema  Dialysis: CCPD 6 exchanges/ 24 hrs, 2500 overnight and 2000 day dwell.  Dwell time 1h 20 min. Dry wt 75.5kg.  Mircera 200 ug given 3/28, next dose 50ug sched for 4/25, Venofer 250 mg scheduled for 4/25   Assessment: 1. Fever/altered mental status/deconditioning: seizure vs metabolic encephalopathy from possible viral  encephalitis. His mental status is now back to baseline.  2. ESRD: Continue CCPD, volume is stable on exam. Wt's down 6kg to around 69kg  3. Anemia: His hemoglobin levels remain low without any overt loss, getting weekly darbe 100 ug. Check fe/ TIBC today.  4. CKD-MBD: slowly improving phosphorus levels, on sevelamer 5. Hypertension: Blood pressures under good control at this time.    Plan - for dc today, will update his PD Rx with the OP PD nurses.    Kelly Splinter MD [facility] pager [phone]   03/27/2017, 3:29 PM    Recent Labs Lab 03/21/17 0740 03/27/17 0733 03/27/17 0949 03/27/17 1220  NA 129*  --  127* 132*  K 3.8  --  3.4* 3.2*  CL 92*  --  90* 94*  CO2 25  --  23 25  GLUCOSE 321* 738* 594* 186*  BUN 42*  --  31* 32*  CREATININE 9.18*  --  9.44* 9.50*  CALCIUM 7.8*  --  8.0* 8.4*  PHOS 6.6*  --   --   --     Recent Labs Lab 03/21/17 0740  ALBUMIN 1.7*    Recent Labs Lab 03/21/17 0740  WBC 6.2  HGB 8.4*  HCT 24.6*  MCV 81.2  PLT 284   Iron/TIBC/Ferritin/ %Sat  Component Value Date/Time   IRON 45 03/26/2017 1255   TIBC 193 (L) 03/26/2017 1255   IRONPCTSAT 23 03/26/2017 1255

## 2017-03-27 NOTE — Significant Event (Addendum)
CRITICAL VALUE ALERT  Critical value received:  Blood Glucose 738  Date of notification:  03/27/2017  Time of notification:  0845  Critical value read back:Yes.    Nurse who received alert:  Dorthula Nettles, RN  MD notified (1st page):  Marlowe Shores, PA  Time of first page:  0847  Marlowe Shores, PA consulted with Oval Linsey, MD. Awaiting orders for BMET and 15 units insulin.

## 2017-03-27 NOTE — Discharge Instructions (Signed)
Inpatient Rehab Discharge Instructions  Jared [name] Discharge date and time: No discharge date for patient encounter.   Activities/Precautions/ Functional Status: Activity: activity as tolerated Diet: Mechanical soft thin liquids Wound Care: none needed Functional status:  ___ No restrictions     ___ Walk up steps independently ___ 24/7 supervision/assistance   ___ Walk up steps with assistance ___ Intermittent supervision/assistance  ___ Bathe/dress independently ___ Walk with walker     _x__ Bathe/dress with assistance ___ Walk Independently    ___ Shower independently ___ Walk with assistance    ___ Shower with assistance ___ No alcohol     ___ Return to work/school ________    COMMUNITY REFERRALS UPON DISCHARGE:    Home Health:   PT     OT    ST                 Agency:  [facility] Phone: [phone]         Special Instructions: Continue peritoneal dialysis as dictated per renal services  Follow up outpatient Lupron injections as prior to admission   My questions have been answered and I understand these instructions. I will adhere to these goals and the provided educational materials after my discharge from the hospital.  Patient/Caregiver Signature _______________________________ Date __________  Clinician Signature _______________________________________ Date __________  Please bring this form and your medication list with you to all your follow-up doctor's appointments.

## 2017-03-27 NOTE — Progress Notes (Signed)
Patient and sisters received discharge instructions from Marlowe Shores, PA-C with verbal understanding. Patient discharged to home into sisters care with patient belongings.

## 2017-03-27 NOTE — Progress Notes (Signed)
Speech Language Pathology Discharge Summary  Patient Details  Name: Jared [name] MRN: [id number] Date of Birth: 1956/03/26  Patient has met 5 of 5 long term goals.  Patient to discharge at overall Min;Mod level.   Reasons goals not met: N/A   Clinical Impression/Discharge Summary: Patient has made functional gains and has met 5 of 5 LTG's this admission. Currently, patient requires overall Min-Mod A verbal cues to complete functional and familiar tasks safely in regards to problem solving, recall and awareness. Patient is also overall supervision-Mod I for verbal expression of wants/needs and auditory comprehension with basic information. Patient and family education is complete and patient will discharge home with 24 hour supervision from family. Patient would benefit from f/u SLP services to maximize his cognitive function and overall functional independence.   Care Partner:  Caregiver Able to Provide Assistance: Yes  Type of Caregiver Assistance: Cognitive  Recommendation:  24 hour supervision/assistance;Home Health SLP  Rationale for SLP Follow Up: Maximize cognitive function and independence;Reduce caregiver burden   Equipment: N/A   Reasons for discharge: Discharged from hospital   Patient/Family Agrees with Progress Made and Goals Achieved: Yes     [place], [place] 03/27/2017, 8:52 AM

## 2017-03-27 NOTE — Progress Notes (Addendum)
[facility]     PROGRESS NOTE    Subjective/Complaints: In bed feeling fine. No complaints. Anxious to get home  ROS: pt denies nausea, vomiting, diarrhea, cough, shortness of breath or chest pain      Objective: Vital Signs: Blood pressure (!) 174/86, pulse 80, temperature 98.5 F (36.9 C), temperature source Oral, resp. rate 15, height 5' 9.6" (1.768 m), weight 68.6 kg (151 lb 3.8 oz), SpO2 99 %. No results found. No results for input(s): WBC, HGB, HCT, PLT in the last 72 hours.  Recent Labs  03/27/17 0733  GLUCOSE 738*   CBG (last 3)   Recent Labs  03/27/17 0631 03/27/17 0633 03/27/17 0643  GLUCAP >600* >600* >600*    Wt Readings from Last 3 Encounters:  03/27/17 68.6 kg (151 lb 3.8 oz)  03/22/17 77 kg (169 lb 12.1 oz)  03/20/17 76.2 kg (167 lb 15.9 oz)    Physical Exam:  Constitutional: He appears well-developed and well-nourished.  HENT:  Head: Normocephalic and atraumatic.  Eyes: PERRL Neck: Normal range of motion. Neck supple. No thyromegaly present.  Cardiovascular: RRR  Respiratory: CTA B GI: Soft. Bowel sounds are normal. He exhibits no distension. There is no tenderness.  Musculoskeletal: He exhibits tr  edema right forearm .  Neurological: He is alert.    A&Ox2 Followed simple commands.  Motor: B/l UE 4/5 proximal to distal B/l LE: 4+/5 proximal to distal  Skin: Skin is warm and dry.  Psychiatric: His affect is appropriate/pleasant  Assessment/Plan: 1. Functional and cognitive deficits secondary to encephalopathy/seizure which require 3+ hours per day of interdisciplinary therapy in a comprehensive inpatient rehab setting. Physiatrist is providing close team supervision and 24 hour management of active medical problems listed below. Physiatrist and rehab team continue to assess barriers to discharge/monitor patient progress toward functional and medical goals.  Function:  Bathing Bathing position    Position: Standing at sink  Bathing parts Body parts bathed by patient: Right arm, Left arm, Chest, Abdomen, Front perineal area, Right upper leg, Left upper leg, Right lower leg, Left lower leg, Buttocks Body parts bathed by helper: Back  Bathing assist Assist Level: More than reasonable time      Upper Body Dressing/Undressing Upper body dressing   What is the patient wearing?: Pull over shirt/dress     Pull over shirt/dress - Perfomed by patient: Thread/unthread right sleeve, Thread/unthread left sleeve, Put head through opening, Pull shirt over trunk          Upper body assist Assist Level: No help, No cues   Set up : To obtain clothing/put away  Lower Body Dressing/Undressing Lower body dressing   What is the patient wearing?: Underwear, Pants, Socks, Shoes Underwear - Performed by patient: Thread/unthread right underwear leg, Thread/unthread left underwear leg, Pull underwear up/down   Pants- Performed by patient: Thread/unthread right pants leg, Thread/unthread left pants leg, Pull pants up/down   Non-skid slipper socks- Performed by patient: Don/doff right sock, Don/doff left sock   Socks - Performed by patient: Don/doff right sock, Don/doff left sock   Shoes - Performed by patient: Don/doff right shoe, Don/doff left shoe, Fasten right, Fasten left         TED Hose - Performed by helper: Don/doff right TED hose, Don/doff left TED hose  Lower body assist Assist for lower body dressing: More than reasonable time      Toileting Toileting   Toileting steps completed by patient: Adjust clothing prior to toileting, Performs  perineal hygiene, Adjust clothing after toileting   Toileting Assistive Devices: Grab bar or rail  Toileting assist Assist level: No help/no cues   Transfers Chair/bed transfer   Chair/bed transfer method: Stand pivot Chair/bed transfer assist level: No Help, no cues, assistive device, takes more than a reasonable amount of time Chair/bed  transfer assistive device: Armrests     Locomotion Ambulation     Max distance: >150 ft Assist level: Supervision or verbal cues   Wheelchair     Max wheelchair distance: 50 Assist Level: Touching or steadying assistance (Pt > 75%)  Cognition Comprehension Comprehension assist level: Understands basic 90% of the time/cues < 10% of the time  Expression Expression assist level: Expresses basic 90% of the time/requires cueing < 10% of the time.  Social Interaction Social Interaction assist level: Interacts appropriately 90% of the time - Needs monitoring or encouragement for participation or interaction.  Problem Solving Problem solving assist level: Solves basic 75 - 89% of the time/requires cueing 10 - 24% of the time  Memory Memory assist level: Recognizes or recalls 25 - 49% of the time/requires cueing 50 - 75% of the time   Medical Problem List and Plan: 1.  Decreased functional mobility, slurred speech secondary to metabolic encephalopathy/fever/ hyperglycemia  -PT, OT, SLP   -discharge home today pending resolution of CBG/DM control 2.  DVT Prophylaxis/Anticoagulation: Subcutaneous heparin. Monitor platelet counts of any signs of bleeding 3. Pain Management: Tylenol as needed 4. Mood: Ativan as needed 5. Neuropsych: This patient is capable of making decisions on his own behalf. 6. Skin/Wound Care: Routine skin checks 7. Fluids/Electrolytes/Nutrition: Routine I&O with follow-up chemistries per renal/according to PD requirements 8. ESRD. Hemodialysis as per renal services 9. Hypertension. Hydralazine 100 mg 3 times a day, Lopressor 100 mg twice a day, Imdur 15 mg daily, nifedipine 90 mg daily. Monitor with increased mobility- good range currently Vitals:   03/27/17 0710 03/27/17 0824  BP: (!) 164/88 (!) 174/86  Pulse: 79 80  Resp: 15   Temp: 98.5 F (36.9 C)    10. Fever. Blood cultures negative. Follow-up infectious disease. Empiric antibiotics discontinued  -afebrile 11.  Seizure disorder. Keppra 250 mg twice a day, no recent sz 12. Diabetes mellitus and peripheral neuropathy. Hemoglobin A1c 8.5. Lantus insulin 8 units BID  -improved this week and suddenly this morning blood glucose is 738!!!  -unclear of source???. Pt appeared asymptomatic this am. States he ate cherry cobbler last night CBG (last 3)   Recent Labs  03/27/17 0631 03/27/17 0633 03/27/17 0643  GLUCAP >600* >600* >600*     -cover with novolog insulin now 15units now---recheck bmet in 1-2 hours  - lantus to 10 units bid 13. History of prostate cancer. Follow-up outpatient Lupron injections with urology services. 14. Acute on chronic anemia. Aranesp 15. Chronic dry cough--tessalon added    -some improvement 16. Mild thrombophlebitis Right forearm---improving  -moist heat, elevation, and observe  -prn tylenol   LOS (Days) 6 A FACE TO FACE EVALUATION WAS PERFORMED  [name], MD 03/27/2017 8:45 AM

## 2017-03-27 NOTE — Progress Notes (Signed)
Patient received one time dose of 15 units Novalog per Dr. Naaman Plummer order for lab draw of blood glucose level of 738.

## 2017-03-27 NOTE — Progress Notes (Signed)
Social Work  Discharge Note  The overall goal for the admission was met for:   Discharge location: Yes - home with sister and other family to provide supervision  Length of Stay: Yes - 6 days  Discharge activity level: Yes - mod independent - supervision  Home/community participation: Yes  Services provided included: MD, RD, PT, OT, SLP, RN, TR, Pharmacy and SW  Financial Services: Medicare  Follow-up services arranged: Home Health: PT, OT, ST via [place] and Patient/Family has no preference for HH/DME agencies  Comments (or additional information):  Patient/Family verbalized understanding of follow-up arrangements: Yes  Individual responsible for coordination of the follow-up plan: pt  Confirmed correct DME delivered: NA - had all needed DME  Mieke Brinley, LCSW

## 2017-03-27 NOTE — Significant Event (Signed)
Hyperglycemic Event: CBG collected this AM multiple times with two different glucometers and reading continues to display >600 for CBG. Patient asymptomatic and states he doesn't "feel different" and feels "fine". PA Dan Fishers notified. Phlebotomy contacted to draw for glucose level. Will continue to monitor.  Annita Brod, RN 03/27/2017

## 2017-03-28 ENCOUNTER — Telehealth: Payer: Self-pay | Admitting: Family Medicine

## 2017-03-28 DIAGNOSIS — D509 Iron deficiency anemia, unspecified: Secondary | ICD-10-CM | POA: Diagnosis not present

## 2017-03-28 DIAGNOSIS — R8299 Other abnormal findings in urine: Secondary | ICD-10-CM | POA: Diagnosis not present

## 2017-03-28 DIAGNOSIS — N186 End stage renal disease: Secondary | ICD-10-CM | POA: Diagnosis not present

## 2017-03-28 DIAGNOSIS — E440 Moderate protein-calorie malnutrition: Secondary | ICD-10-CM | POA: Diagnosis not present

## 2017-03-28 DIAGNOSIS — Z79899 Other long term (current) drug therapy: Secondary | ICD-10-CM | POA: Diagnosis not present

## 2017-03-28 DIAGNOSIS — Z5189 Encounter for other specified aftercare: Secondary | ICD-10-CM | POA: Diagnosis not present

## 2017-03-28 DIAGNOSIS — D631 Anemia in chronic kidney disease: Secondary | ICD-10-CM | POA: Diagnosis not present

## 2017-03-28 NOTE — Telephone Encounter (Signed)
Left message for pt to call for TOC/hospital follow up. Pt already has a OV scheduled.

## 2017-03-29 DIAGNOSIS — E440 Moderate protein-calorie malnutrition: Secondary | ICD-10-CM | POA: Diagnosis not present

## 2017-03-29 DIAGNOSIS — Z5189 Encounter for other specified aftercare: Secondary | ICD-10-CM | POA: Diagnosis not present

## 2017-03-29 DIAGNOSIS — D631 Anemia in chronic kidney disease: Secondary | ICD-10-CM | POA: Diagnosis not present

## 2017-03-29 DIAGNOSIS — Z79899 Other long term (current) drug therapy: Secondary | ICD-10-CM | POA: Diagnosis not present

## 2017-03-29 DIAGNOSIS — D509 Iron deficiency anemia, unspecified: Secondary | ICD-10-CM | POA: Diagnosis not present

## 2017-03-29 DIAGNOSIS — N186 End stage renal disease: Secondary | ICD-10-CM | POA: Diagnosis not present

## 2017-03-30 ENCOUNTER — Encounter: Payer: Self-pay | Admitting: Endocrinology

## 2017-03-30 ENCOUNTER — Ambulatory Visit (INDEPENDENT_AMBULATORY_CARE_PROVIDER_SITE_OTHER): Payer: Medicare Other | Admitting: Endocrinology

## 2017-03-30 VITALS — BP 148/84 | HR 80 | Ht 67.8 in | Wt 155.0 lb

## 2017-03-30 DIAGNOSIS — E440 Moderate protein-calorie malnutrition: Secondary | ICD-10-CM | POA: Diagnosis not present

## 2017-03-30 DIAGNOSIS — E0865 Diabetes mellitus due to underlying condition with hyperglycemia: Secondary | ICD-10-CM

## 2017-03-30 DIAGNOSIS — D509 Iron deficiency anemia, unspecified: Secondary | ICD-10-CM | POA: Diagnosis not present

## 2017-03-30 DIAGNOSIS — N186 End stage renal disease: Secondary | ICD-10-CM | POA: Diagnosis not present

## 2017-03-30 DIAGNOSIS — IMO0001 Reserved for inherently not codable concepts without codable children: Secondary | ICD-10-CM

## 2017-03-30 DIAGNOSIS — D631 Anemia in chronic kidney disease: Secondary | ICD-10-CM | POA: Diagnosis not present

## 2017-03-30 DIAGNOSIS — Z79899 Other long term (current) drug therapy: Secondary | ICD-10-CM | POA: Diagnosis not present

## 2017-03-30 DIAGNOSIS — Z5189 Encounter for other specified aftercare: Secondary | ICD-10-CM | POA: Diagnosis not present

## 2017-03-30 DIAGNOSIS — Z794 Long term (current) use of insulin: Secondary | ICD-10-CM

## 2017-03-30 MED ORDER — INSULIN NPH (HUMAN) (ISOPHANE) 100 UNIT/ML ~~LOC~~ SUSP: 20.0000 [IU] | SUBCUTANEOUS | 11 refills | Status: DC

## 2017-03-30 NOTE — Patient Instructions (Addendum)
good diet and exercise significantly improve the control of your diabetes.  please let me know if you wish to be referred to a dietician.  high blood sugar is very risky to your health.  you should see an eye doctor and dentist every year.  It is very important to get all recommended vaccinations.  Controlling your blood pressure and cholesterol drastically reduces the damage diabetes does to your body.  Those who smoke should quit.  Please discuss these with your doctor.  check your blood sugar twice a day.  vary the time of day when you check, between before the 3 meals, and at bedtime.  also check if you have symptoms of your blood sugar being too high or too low.  please keep a record of the readings and bring it to your next appointment here (or you can bring the meter itself).  You can write it on any piece of paper.  please call us sooner if your blood sugar goes below 70, or if you have a lot of readings over 200. I have sent a prescription to your pharmacy, to change the lantus to NPH, 20 units each morning.   On this type of insulin schedule, you should eat meals on a regular schedule.  If a meal is missed or significantly delayed, your blood sugar could go low.  Please come back for a follow-up appointment next week.  Please bring you meter and a strip.

## 2017-03-30 NOTE — Progress Notes (Signed)
Subjective:    Patient ID: Jared [name], male    DOB: 1956-01-02, 61 y.o.   MRN: [id number]  HPI pt is referred by Harland Dingwall, NP, for diabetes.  Pt states DM was dx'ed in 1992; he has mild neuropathy of the lower extremities; he has associated chronic ESRD and retinopathy; he has been on insulin since soon after dx; pt says his diet and exercise are not good; he has never had pancreatitis, pancreatic surgery, or DKA.  He has had only 1 episode of severe hypoglycemia (2015).  He lives with 2 sisters, but he takes his own insulin, and says he never misses it.  He also checks his own cbg.  Sisters say they keep an eye on him.   He says cbg varies from 100-400.  It is in general higher as the day goes on.   Past Medical History:  Diagnosis Date  . Anemia   . Chronic kidney disease    on dialysis M-W-F  . Diabetes mellitus without complication ([facility])    onset as adult  . Hypertension   . Seizures ([place])   . Stroke ([facility])   . Thyroid disease    hyperparathyroidism    Past Surgical History:  Procedure Laterality Date  . AV FISTULA PLACEMENT Left 06/04/2014   Procedure: ARTERIOVENOUS (AV) FISTULA CREATION-  BRACHIOCEPHALIC WITH LIGATION OF COMPETEING BRANCH;  Surgeon: Mal Misty, MD;  Location: [facility];  Service: Vascular;  Laterality: Left;  . INSERTION OF DIALYSIS CATHETER  04/2014  . IR GENERIC HISTORICAL Left 11/30/2016   IR THROMBECTOMY AV FISTULA W/THROMBOLYSIS/PTA/STENT INC/SHUNT/IMG LT 11/30/2016 Markus Daft, MD [facility]  . IR GENERIC HISTORICAL  11/30/2016   IR US GUIDE VASC ACCESS LEFT 11/30/2016 Markus Daft, MD [facility]    Social History   Social History  . Marital status: Divorced    Spouse name: N/A  . Number of children: N/A  . Years of education: N/A   Occupational History  . Not on file.   Social History Main Topics  . Smoking status: Former Smoker    Quit date: 05/11/1984  . Smokeless tobacco: Never Used  . Alcohol use 0.6 oz/week    1 Glasses of wine per week      Comment: occasional  . Drug use: No  . Sexual activity: Not on file   Other Topics Concern  . Not on file   Social History Narrative  . No narrative on file    Current Outpatient Prescriptions on File Prior to Visit  Medication Sig Dispense Refill  . atorvastatin (LIPITOR) 20 MG tablet Take 1 tablet (20 mg total) by mouth daily. 90 tablet 3  . benzonatate (TESSALON) 100 MG capsule Take 1 capsule (100 mg total) by mouth 3 (three) times daily. 9 capsule 0  . calcium carbonate (TUMS - DOSED IN MG ELEMENTAL CALCIUM) 500 MG chewable tablet Chew 1 tablet (200 mg of elemental calcium total) by mouth every 6 (six) hours. 120 tablet 0  . hydrALAZINE (APRESOLINE) 100 MG tablet Take 1 tablet (100 mg total) by mouth 3 (three) times daily. 90 tablet 0  . isosorbide mononitrate (IMDUR) 30 MG 24 hr tablet Take 1 tablet (30 mg total) by mouth daily. 30 tablet 1  . levETIRAcetam (KEPPRA) 250 MG tablet Take 1 tablet (250 mg total) by mouth 2 (two) times daily. 60 tablet 0  . loratadine (CLARITIN) 10 MG tablet Take 1 tablet (10 mg total) by mouth daily. 30 tablet 1  . metoprolol (  LOPRESSOR) 100 MG tablet Take 1 tablet (100 mg total) by mouth 2 (two) times daily. 60 tablet 0  . multivitamin (RENA-VIT) TABS tablet Take 1 tablet by mouth daily.    Marland Kitchen NIFEdipine (PROCARDIA XL/ADALAT-CC) 90 MG 24 hr tablet Take 1 tablet (90 mg total) by mouth daily. 30 tablet 2  . pantoprazole (PROTONIX) 40 MG tablet Take 1 tablet (40 mg total) by mouth daily at 6 (six) AM. 30 tablet 1  . senna-docusate (SENOKOT-S) 8.6-50 MG tablet Take 2 tablets by mouth at bedtime. 60 tablet 0  . sevelamer carbonate (RENVELA) 800 MG tablet Take 3 tablets (2,400 mg total) by mouth 3 (three) times daily with meals. 240 tablet 1   No current facility-administered medications on file prior to visit.     Allergies  Allergen Reactions  . Penicillins Other (See Comments)    Has patient had a PCN reaction causing immediate rash,  facial/tongue/throat swelling, SOB or lightheadedness with hypotension: Unk Has patient had a PCN reaction causing severe rash involving mucus membranes or skin necrosis: Unk Has patient had a PCN reaction that required hospitalization: Unk Has patient had a PCN reaction occurring within the last 10 years: Unk If all of the above answers are "NO", then may proceed with Cephalosporin use.     Family History  Problem Relation Age of Onset  . Cancer - Lung Mother   . Cancer - Other Father   . Diabetes Neg Hx     BP (!) 148/84   Pulse 80   Ht 5' 7.8" (1.722 m)   Wt 155 lb (70.3 kg)   SpO2 98%   BMI 23.71 kg/m    Review of Systems denies blurry vision, headache, chest pain, sob, n/v, urinary frequency, muscle cramps, and excessive diaphoresis.  He has lost weight.  He has memory loss, rhinorrhea, easy bruising and cold intolerance.    Objective:   Physical Exam VS: see vs page GEN: no distress HEAD: head: no deformity eyes: no periorbital swelling, no proptosis external nose and ears are normal mouth: no lesion seen NECK: supple, thyroid is not enlarged CHEST WALL: no deformity LUNGS: clear to auscultation CV: reg rate and rhythm, no murmur ABD: abdomen is soft, nontender.  no hepatosplenomegaly.  not distended.  no hernia.  PD catheter is present.  MUSCULOSKELETAL: muscle bulk and strength are grossly normal.  no obvious joint swelling.  gait is normal and steady EXTEMITIES: no deformity.  no ulcer on the feet.  feet are of normal color and temp.  no edema PULSES: dorsalis pedis intact bilat.  no carotid bruit NEURO:  cn 2-12 grossly intact.   readily moves all 4's.  sensation is intact to touch on the feet SKIN:  Normal texture and temperature.  No rash or suspicious lesion is visible.   NODES:  None palpable at the neck PSYCH: alert, oriented to self and place.  Does not appear anxious nor depressed.    Lab Results  Component Value Date   HGBA1C 8.5 (H) 03/14/2017     I personally reviewed electrocardiogram tracing (03/14/17):  Indication: encephalopathy.  Impression: ST.  No MI.  No hypertrophy.  LAFB.   Compared to 11/22/16: no signif change.      Assessment & Plan:  Insulin-requiring type 2 DM: poor glycemic control.  He may be developing type 1.  Encephalopathy: in this setting, he is not a candidate for multiple daily injections.   ESRD: in this setting, he needs a faster-acting qd insulin. This  is supported by the pattern of his cbg's.   Patient Instructions  good diet and exercise significantly improve the control of your diabetes.  please let me know if you wish to be referred to a dietician.  high blood sugar is very risky to your health.  you should see an eye doctor and dentist every year.  It is very important to get all recommended vaccinations.  Controlling your blood pressure and cholesterol drastically reduces the damage diabetes does to your body.  Those who smoke should quit.  Please discuss these with your doctor.  check your blood sugar twice a day.  vary the time of day when you check, between before the 3 meals, and at bedtime.  also check if you have symptoms of your blood sugar being too high or too low.  please keep a record of the readings and bring it to your next appointment here (or you can bring the meter itself).  You can write it on any piece of paper.  please call us sooner if your blood sugar goes below 70, or if you have a lot of readings over 200. I have sent a prescription to your pharmacy, to change the lantus to NPH, 20 units each morning.   On this type of insulin schedule, you should eat meals on a regular schedule.  If a meal is missed or significantly delayed, your blood sugar could go low.  Please come back for a follow-up appointment next week.  Please bring you meter and a strip.

## 2017-03-31 DIAGNOSIS — D509 Iron deficiency anemia, unspecified: Secondary | ICD-10-CM | POA: Diagnosis not present

## 2017-03-31 DIAGNOSIS — E440 Moderate protein-calorie malnutrition: Secondary | ICD-10-CM | POA: Diagnosis not present

## 2017-03-31 DIAGNOSIS — Z5189 Encounter for other specified aftercare: Secondary | ICD-10-CM | POA: Diagnosis not present

## 2017-03-31 DIAGNOSIS — Z79899 Other long term (current) drug therapy: Secondary | ICD-10-CM | POA: Diagnosis not present

## 2017-03-31 DIAGNOSIS — N186 End stage renal disease: Secondary | ICD-10-CM | POA: Diagnosis not present

## 2017-03-31 DIAGNOSIS — D631 Anemia in chronic kidney disease: Secondary | ICD-10-CM | POA: Diagnosis not present

## 2017-04-01 DIAGNOSIS — D509 Iron deficiency anemia, unspecified: Secondary | ICD-10-CM | POA: Diagnosis not present

## 2017-04-01 DIAGNOSIS — E440 Moderate protein-calorie malnutrition: Secondary | ICD-10-CM | POA: Diagnosis not present

## 2017-04-01 DIAGNOSIS — D631 Anemia in chronic kidney disease: Secondary | ICD-10-CM | POA: Diagnosis not present

## 2017-04-01 DIAGNOSIS — N186 End stage renal disease: Secondary | ICD-10-CM | POA: Diagnosis not present

## 2017-04-01 DIAGNOSIS — Z79899 Other long term (current) drug therapy: Secondary | ICD-10-CM | POA: Diagnosis not present

## 2017-04-01 DIAGNOSIS — Z5189 Encounter for other specified aftercare: Secondary | ICD-10-CM | POA: Diagnosis not present

## 2017-04-02 ENCOUNTER — Telehealth: Payer: Self-pay | Admitting: Endocrinology

## 2017-04-02 DIAGNOSIS — D509 Iron deficiency anemia, unspecified: Secondary | ICD-10-CM | POA: Diagnosis not present

## 2017-04-02 DIAGNOSIS — Z5189 Encounter for other specified aftercare: Secondary | ICD-10-CM | POA: Diagnosis not present

## 2017-04-02 DIAGNOSIS — D631 Anemia in chronic kidney disease: Secondary | ICD-10-CM | POA: Diagnosis not present

## 2017-04-02 DIAGNOSIS — Z79899 Other long term (current) drug therapy: Secondary | ICD-10-CM | POA: Diagnosis not present

## 2017-04-02 DIAGNOSIS — E440 Moderate protein-calorie malnutrition: Secondary | ICD-10-CM | POA: Diagnosis not present

## 2017-04-02 DIAGNOSIS — N186 End stage renal disease: Secondary | ICD-10-CM | POA: Diagnosis not present

## 2017-04-02 NOTE — Telephone Encounter (Signed)
See message and please advise.  

## 2017-04-02 NOTE — Telephone Encounter (Signed)
Forwarding

## 2017-04-02 NOTE — Telephone Encounter (Signed)
BS been running very high, in the 500s over the weekend it has been fluctuating but not below 200

## 2017-04-02 NOTE — Telephone Encounter (Signed)
I contacted the patient and advised of new instructions via voicemail. Requested a call back if the patient would like to discuss further and advised to call toward the end of the week to report blood sugar readings.

## 2017-04-02 NOTE — Telephone Encounter (Signed)
Increase NPH to 25 units each morning.

## 2017-04-03 ENCOUNTER — Other Ambulatory Visit: Payer: Self-pay

## 2017-04-03 DIAGNOSIS — Z79899 Other long term (current) drug therapy: Secondary | ICD-10-CM | POA: Diagnosis not present

## 2017-04-03 DIAGNOSIS — D509 Iron deficiency anemia, unspecified: Secondary | ICD-10-CM | POA: Diagnosis not present

## 2017-04-03 DIAGNOSIS — E440 Moderate protein-calorie malnutrition: Secondary | ICD-10-CM | POA: Diagnosis not present

## 2017-04-03 DIAGNOSIS — N186 End stage renal disease: Secondary | ICD-10-CM | POA: Diagnosis not present

## 2017-04-03 DIAGNOSIS — Z5189 Encounter for other specified aftercare: Secondary | ICD-10-CM | POA: Diagnosis not present

## 2017-04-03 DIAGNOSIS — D631 Anemia in chronic kidney disease: Secondary | ICD-10-CM | POA: Diagnosis not present

## 2017-04-03 MED ORDER — INSULIN NPH (HUMAN) (ISOPHANE) 100 UNIT/ML ~~LOC~~ SUSP: SUBCUTANEOUS | 5 refills | Status: AC

## 2017-04-04 DIAGNOSIS — E440 Moderate protein-calorie malnutrition: Secondary | ICD-10-CM | POA: Diagnosis not present

## 2017-04-04 DIAGNOSIS — Z79899 Other long term (current) drug therapy: Secondary | ICD-10-CM | POA: Diagnosis not present

## 2017-04-04 DIAGNOSIS — N186 End stage renal disease: Secondary | ICD-10-CM | POA: Diagnosis not present

## 2017-04-04 DIAGNOSIS — D631 Anemia in chronic kidney disease: Secondary | ICD-10-CM | POA: Diagnosis not present

## 2017-04-04 DIAGNOSIS — Z5189 Encounter for other specified aftercare: Secondary | ICD-10-CM | POA: Diagnosis not present

## 2017-04-04 DIAGNOSIS — D509 Iron deficiency anemia, unspecified: Secondary | ICD-10-CM | POA: Diagnosis not present

## 2017-04-04 NOTE — Telephone Encounter (Signed)
Please verify no n/v/sob Please increase NPH to 30 units qam Drink plenty of fluids. I'll see you soon.

## 2017-04-04 NOTE — Telephone Encounter (Signed)
No n/v/sob just very tired- had iron fusion today- patients sister stated an understanding of advise

## 2017-04-04 NOTE — Telephone Encounter (Signed)
Patient's sister called in the morning to report blood sugar readings. She stated this morning the patient's blood sugar was not readable on the meter. Patient has made the increase of the NPH insulin to 25 units in the am.  Please advise, Thanks!

## 2017-04-05 ENCOUNTER — Ambulatory Visit (INDEPENDENT_AMBULATORY_CARE_PROVIDER_SITE_OTHER): Payer: Medicare Other | Admitting: Family Medicine

## 2017-04-05 ENCOUNTER — Encounter: Payer: Self-pay | Admitting: Family Medicine

## 2017-04-05 ENCOUNTER — Telehealth: Payer: Self-pay | Admitting: *Deleted

## 2017-04-05 VITALS — BP 130/72 | HR 71 | Temp 97.9°F | Wt 152.0 lb

## 2017-04-05 DIAGNOSIS — R05 Cough: Secondary | ICD-10-CM

## 2017-04-05 DIAGNOSIS — Z5189 Encounter for other specified aftercare: Secondary | ICD-10-CM | POA: Diagnosis not present

## 2017-04-05 DIAGNOSIS — I10 Essential (primary) hypertension: Secondary | ICD-10-CM | POA: Diagnosis not present

## 2017-04-05 DIAGNOSIS — D631 Anemia in chronic kidney disease: Secondary | ICD-10-CM | POA: Diagnosis not present

## 2017-04-05 DIAGNOSIS — Z79899 Other long term (current) drug therapy: Secondary | ICD-10-CM | POA: Diagnosis not present

## 2017-04-05 DIAGNOSIS — K5900 Constipation, unspecified: Secondary | ICD-10-CM | POA: Diagnosis not present

## 2017-04-05 DIAGNOSIS — N186 End stage renal disease: Secondary | ICD-10-CM | POA: Diagnosis not present

## 2017-04-05 DIAGNOSIS — D509 Iron deficiency anemia, unspecified: Secondary | ICD-10-CM | POA: Diagnosis not present

## 2017-04-05 DIAGNOSIS — E440 Moderate protein-calorie malnutrition: Secondary | ICD-10-CM | POA: Diagnosis not present

## 2017-04-05 DIAGNOSIS — Z09 Encounter for follow-up examination after completed treatment for conditions other than malignant neoplasm: Secondary | ICD-10-CM

## 2017-04-05 DIAGNOSIS — R059 Cough, unspecified: Secondary | ICD-10-CM

## 2017-04-05 MED ORDER — SENNOSIDES-DOCUSATE SODIUM 8.6-50 MG PO TABS: 2.0000 | ORAL_TABLET | Freq: Every day | ORAL | 1 refills | Status: DC

## 2017-04-05 MED ORDER — BENZONATATE 100 MG PO CAPS: 100.0000 mg | ORAL_CAPSULE | Freq: Three times a day (TID) | ORAL | 0 refills | Status: DC | PRN

## 2017-04-05 MED ORDER — POLYETHYLENE GLYCOL 3350 17 GM/SCOOP PO POWD: 17.0000 g | Freq: Every day | ORAL | 1 refills | Status: DC

## 2017-04-05 NOTE — Progress Notes (Signed)
Subjective:    Patient ID: Jared [name], male    DOB: 1955/12/14, 61 y.o.   MRN: [id number]  HPI Chief Complaint  Patient presents with  . hospital follow-up    hospital follow-up. saw Endocrinology for DM. having cough still and wants refill tessalon perles, having some conspitation and wants refill on stool softerners   He is here for a hospital follow up. He was admitted April 17 for altered mental status, slurred speech and a serum glucose >1100. He was discharged from the hospital to inpatient rehab on April 25. States they were unable to determine exact cause for his symptoms and states they ruled out a stroke. He is living with his sister. He also has a sister who is a Marine scientist and she is helping out as well.   He states he feels "good".  He was determined to have uncontrolled and brittle diabetes and is seeing Dr. Loanne Drilling, endocrinologist for this. He is checking his blood sugars 6 times per day. He is seeing Dr. Loanne Drilling again tomorrow.  Saw his nephrologist 2 days ago. Has been going to the dialysis center and has been getting iron infusions. He does daily PD.   Complains of dry cough that is intermittent. States he would like to have more Tessalon in case he needs it for cough. Thinks he may have allergies. Also reports rhinorrhea. He has Claritin at home but has not tried it.  States he was prescribed this upon discharge from rehab. He has not coughed throughout our visit.  Denies fever, chills, shortness of breath, orthopnea, LE edema.    Complains of intermittent hard stools and constipation. Requests refill of stool softeners and something else to help when he is having difficulty. Denies abdominal pain, N/V. No blood in stool.   Past Medical History:  Diagnosis Date  . Anemia   . Chronic kidney disease    on dialysis M-W-F  . Diabetes mellitus without complication ([place])    onset as adult  . Hypertension   . Seizures ([place])   . Stroke ([place])   . Thyroid disease    hyperparathyroidism   Past Surgical History:  Procedure Laterality Date  . AV FISTULA PLACEMENT Left 06/04/2014   Procedure: ARTERIOVENOUS (AV) FISTULA CREATION-  BRACHIOCEPHALIC WITH LIGATION OF COMPETEING BRANCH;  Surgeon: Mal Misty, MD;  Location: [facility];  Service: Vascular;  Laterality: Left;  . INSERTION OF DIALYSIS CATHETER  04/2014  . IR GENERIC HISTORICAL Left 11/30/2016   IR THROMBECTOMY AV FISTULA W/THROMBOLYSIS/PTA/STENT INC/SHUNT/IMG LT 11/30/2016 Markus Daft, MD [facility]  . IR GENERIC HISTORICAL  11/30/2016   IR US GUIDE VASC ACCESS LEFT 11/30/2016 Markus Daft, MD [facility]    Reviewed allergies, medications, past medical, surgical,  and social history.    Review of Systems Pertinent positives and negatives in the history of present illness.     Objective:   Physical Exam BP 130/72   Pulse 71   Temp 97.9 F (36.6 C) (Oral)   Wt 152 lb (68.9 kg)   SpO2 98%   BMI 23.25 kg/m   Alert and in no distress. Tympanic membranes and canals are normal. Pharyngeal area is normal. Neck is supple without adenopathy or thyromegaly. Cardiac exam shows a regular sinus rhythm without murmurs or gallops. Lungs are clear to auscultation. Extremities without edema, normal pulses.      Assessment & Plan:  Hospital discharge follow-up  Essential hypertension  Constipation, unspecified constipation type - Plan: polyethylene glycol powder (GLYCOLAX/MIRALAX) powder,  senna-docusate (SENOKOT-S) 8.6-50 MG tablet  Cough - Plan: benzonatate (TESSALON) 100 MG capsule  He has followed up with endocrinology and nephrology since discharge.  States he has had "too much" blood taken and refuses labs today.  BP is within goal range.  Discussed that his cough may be related to allergies and recommend that he try the Claritin to see if this helps.  He will let me know if cough worsens or is not clearing up. No sign of infection.  Discussed using stool softeners and he may try Miralax for hard  stools. He will let me know if this is not helping.  He will see Dr Loanne Drilling tomorrow for diabetes.  Follow up in 1 month for cough and constipation or sooner if needed.

## 2017-04-05 NOTE — Telephone Encounter (Signed)
Elizabeth from [facility] called and reported that Mr Jared [name] is refusing ST PT OT services and says he feels better than before and does not need the services.  Calling to let you know that they are d/cing him from [facility].

## 2017-04-05 NOTE — Patient Instructions (Addendum)
Take the stool softeners and try the Miralax for hard stools and constipation.   If you are needing the Tessalon more than the next 2-3 days then your cough may be related to allergies or reflux.  You can try the Claritin and see if this helps with allergies.   Follow up in 1 month.

## 2017-04-06 ENCOUNTER — Ambulatory Visit: Payer: Medicare Other | Admitting: Endocrinology

## 2017-04-06 ENCOUNTER — Ambulatory Visit (INDEPENDENT_AMBULATORY_CARE_PROVIDER_SITE_OTHER): Payer: Medicare Other | Admitting: Endocrinology

## 2017-04-06 VITALS — BP 138/72 | HR 73 | Ht 67.8 in | Wt 154.0 lb

## 2017-04-06 DIAGNOSIS — E440 Moderate protein-calorie malnutrition: Secondary | ICD-10-CM | POA: Diagnosis not present

## 2017-04-06 DIAGNOSIS — Z79899 Other long term (current) drug therapy: Secondary | ICD-10-CM | POA: Diagnosis not present

## 2017-04-06 DIAGNOSIS — Z5189 Encounter for other specified aftercare: Secondary | ICD-10-CM | POA: Diagnosis not present

## 2017-04-06 DIAGNOSIS — D631 Anemia in chronic kidney disease: Secondary | ICD-10-CM | POA: Diagnosis not present

## 2017-04-06 DIAGNOSIS — D509 Iron deficiency anemia, unspecified: Secondary | ICD-10-CM | POA: Diagnosis not present

## 2017-04-06 DIAGNOSIS — E1142 Type 2 diabetes mellitus with diabetic polyneuropathy: Secondary | ICD-10-CM

## 2017-04-06 DIAGNOSIS — N186 End stage renal disease: Secondary | ICD-10-CM | POA: Diagnosis not present

## 2017-04-06 NOTE — Progress Notes (Signed)
Subjective:    Patient ID: Jared [name], male    DOB: 1956/04/01, 61 y.o.   MRN: [id number]  HPI Pt returns for f/u of diabetes mellitus: DM type: Insulin-requiring type 2 Dx'ed: 3151 Complications: polyneuropathy, ESRD, and retinopathy Therapy: insulin since soon after dx DKA: never Severe hypoglycemia: once (2015) Pancreatitis: never Other: He lives with 2 sisters, but he takes his own insulin, and says he never misses it.  He also checks his own cbg.  Sisters say they keep an eye on him; on lantus, he had am hypoglycemia and pm hyperglycemia Interval history: Since on 30/d (just a few days), cbg's vary from 100-300.  There is no trend throughout the day.  He has a new meter, which he says is working well.   Past Medical History:  Diagnosis Date  . Anemia   . Chronic kidney disease    on dialysis M-W-F  . Diabetes mellitus without complication ([place])    onset as adult  . Hypertension   . Seizures ([place])   . Stroke ([place])   . Thyroid disease    hyperparathyroidism    Past Surgical History:  Procedure Laterality Date  . AV FISTULA PLACEMENT Left 06/04/2014   Procedure: ARTERIOVENOUS (AV) FISTULA CREATION-  BRACHIOCEPHALIC WITH LIGATION OF COMPETEING BRANCH;  Surgeon: Mal Misty, MD;  Location: [place];  Service: Vascular;  Laterality: Left;  . INSERTION OF DIALYSIS CATHETER  04/2014  . IR GENERIC HISTORICAL Left 11/30/2016   IR THROMBECTOMY AV FISTULA W/THROMBOLYSIS/PTA/STENT INC/SHUNT/IMG LT 11/30/2016 Markus Daft, MD [facility]  . IR GENERIC HISTORICAL  11/30/2016   IR US GUIDE VASC ACCESS LEFT 11/30/2016 Markus Daft, MD [facility]    Social History   Social History  . Marital status: Divorced    Spouse name: N/A  . Number of children: N/A  . Years of education: N/A   Occupational History  . Not on file.   Social History Main Topics  . Smoking status: Former Smoker    Quit date: 05/11/1984  . Smokeless tobacco: Never Used  . Alcohol use 0.6 oz/week    1 Glasses of  wine per week     Comment: occasional  . Drug use: No  . Sexual activity: Not on file   Other Topics Concern  . Not on file   Social History Narrative  . No narrative on file    Current Outpatient Prescriptions on File Prior to Visit  Medication Sig Dispense Refill  . atorvastatin (LIPITOR) 20 MG tablet Take 1 tablet (20 mg total) by mouth daily. 90 tablet 3  . calcium carbonate (TUMS - DOSED IN MG ELEMENTAL CALCIUM) 500 MG chewable tablet Chew 1 tablet (200 mg of elemental calcium total) by mouth every 6 (six) hours. 120 tablet 0  . hydrALAZINE (APRESOLINE) 100 MG tablet Take 1 tablet (100 mg total) by mouth 3 (three) times daily. 90 tablet 0  . insulin NPH Human (NOVOLIN N) 100 UNIT/ML injection Inject 20 units every morning (Patient taking differently: 30 Units daily before breakfast. Inject 20 units every morning) 10 mL 5  . isosorbide mononitrate (IMDUR) 30 MG 24 hr tablet Take 1 tablet (30 mg total) by mouth daily. 30 tablet 1  . levETIRAcetam (KEPPRA) 250 MG tablet Take 1 tablet (250 mg total) by mouth 2 (two) times daily. 60 tablet 0  . loratadine (CLARITIN) 10 MG tablet Take 1 tablet (10 mg total) by mouth daily. 30 tablet 1  . metoprolol (LOPRESSOR) 100 MG tablet Take  1 tablet (100 mg total) by mouth 2 (two) times daily. 60 tablet 0  . multivitamin (RENA-VIT) TABS tablet Take 1 tablet by mouth daily.    Marland Kitchen NIFEdipine (PROCARDIA XL/ADALAT-CC) 90 MG 24 hr tablet Take 1 tablet (90 mg total) by mouth daily. 30 tablet 2  . pantoprazole (PROTONIX) 40 MG tablet Take 1 tablet (40 mg total) by mouth daily at 6 (six) AM. 30 tablet 1  . polyethylene glycol powder (GLYCOLAX/MIRALAX) powder Take 17 g by mouth daily. 3350 g 1  . senna-docusate (SENOKOT-S) 8.6-50 MG tablet Take 2 tablets by mouth at bedtime. 60 tablet 1  . sevelamer carbonate (RENVELA) 800 MG tablet Take 3 tablets (2,400 mg total) by mouth 3 (three) times daily with meals. 240 tablet 1  . benzonatate (TESSALON) 100 MG capsule  Take 1 capsule (100 mg total) by mouth 3 (three) times daily as needed for cough. (Patient not taking: Reported on 04/06/2017) 20 capsule 0   No current facility-administered medications on file prior to visit.     Allergies  Allergen Reactions  . Penicillins Other (See Comments)    Has patient had a PCN reaction causing immediate rash, facial/tongue/throat swelling, SOB or lightheadedness with hypotension: Unk Has patient had a PCN reaction causing severe rash involving mucus membranes or skin necrosis: Unk Has patient had a PCN reaction that required hospitalization: Unk Has patient had a PCN reaction occurring within the last 10 years: Unk If all of the above answers are "NO", then may proceed with Cephalosporin use.     Family History  Problem Relation Age of Onset  . Cancer - Lung Mother   . Cancer - Other Father   . Diabetes Neg Hx     BP 138/72 (BP Location: Right Arm, Patient Position: Sitting, Cuff Size: Normal)   Pulse 73   Ht 5' 7.8" (1.722 m)   Wt 154 lb (69.9 kg)   SpO2 92%   BMI 23.55 kg/m   Review of Systems He denies hypoglycemia.     Objective:   Physical Exam VITAL SIGNS:  See vs page GENERAL: no distress Pulses: dorsalis pedis intact bilat.   MSK: no deformity of the feet CV: no leg edema.  Skin:  no ulcer on the feet.  normal color and temp on the feet.  Neuro: sensation is intact to touch on the feet.      Assessment & Plan:  Insulin-requiring type 2 DM, with ESRD: he needs increased rx.  Patient Instructions  check your blood sugar twice a day.  vary the time of day when you check, between before the 3 meals, and at bedtime.  also check if you have symptoms of your blood sugar being too high or too low.  please keep a record of the readings and bring it to your next appointment here (or you can bring the meter itself).  You can write it on any piece of paper.  please call us sooner if your blood sugar goes below 70, or if you have a lot of  readings over 200. Please continue the same NPH: 30 units each morning.   On this type of insulin schedule, you should eat meals on a regular schedule.  If a meal is missed or significantly delayed, your blood sugar could go low.   Please come back for a follow-up appointment in 2 months.

## 2017-04-06 NOTE — Patient Instructions (Addendum)
check your blood sugar twice a day.  vary the time of day when you check, between before the 3 meals, and at bedtime.  also check if you have symptoms of your blood sugar being too high or too low.  please keep a record of the readings and bring it to your next appointment here (or you can bring the meter itself).  You can write it on any piece of paper.  please call us sooner if your blood sugar goes below 70, or if you have a lot of readings over 200. Please continue the same NPH: 30 units each morning.   On this type of insulin schedule, you should eat meals on a regular schedule.  If a meal is missed or significantly delayed, your blood sugar could go low.   Please come back for a follow-up appointment in 2 months.

## 2017-04-07 DIAGNOSIS — Z5189 Encounter for other specified aftercare: Secondary | ICD-10-CM | POA: Diagnosis not present

## 2017-04-07 DIAGNOSIS — N186 End stage renal disease: Secondary | ICD-10-CM | POA: Diagnosis not present

## 2017-04-07 DIAGNOSIS — E440 Moderate protein-calorie malnutrition: Secondary | ICD-10-CM | POA: Diagnosis not present

## 2017-04-07 DIAGNOSIS — Z79899 Other long term (current) drug therapy: Secondary | ICD-10-CM | POA: Diagnosis not present

## 2017-04-07 DIAGNOSIS — D509 Iron deficiency anemia, unspecified: Secondary | ICD-10-CM | POA: Diagnosis not present

## 2017-04-07 DIAGNOSIS — D631 Anemia in chronic kidney disease: Secondary | ICD-10-CM | POA: Diagnosis not present

## 2017-04-08 DIAGNOSIS — D509 Iron deficiency anemia, unspecified: Secondary | ICD-10-CM | POA: Diagnosis not present

## 2017-04-08 DIAGNOSIS — D631 Anemia in chronic kidney disease: Secondary | ICD-10-CM | POA: Diagnosis not present

## 2017-04-08 DIAGNOSIS — Z79899 Other long term (current) drug therapy: Secondary | ICD-10-CM | POA: Diagnosis not present

## 2017-04-08 DIAGNOSIS — E440 Moderate protein-calorie malnutrition: Secondary | ICD-10-CM | POA: Diagnosis not present

## 2017-04-08 DIAGNOSIS — Z5189 Encounter for other specified aftercare: Secondary | ICD-10-CM | POA: Diagnosis not present

## 2017-04-08 DIAGNOSIS — N186 End stage renal disease: Secondary | ICD-10-CM | POA: Diagnosis not present

## 2017-04-09 DIAGNOSIS — Z5189 Encounter for other specified aftercare: Secondary | ICD-10-CM | POA: Diagnosis not present

## 2017-04-09 DIAGNOSIS — D509 Iron deficiency anemia, unspecified: Secondary | ICD-10-CM | POA: Diagnosis not present

## 2017-04-09 DIAGNOSIS — D631 Anemia in chronic kidney disease: Secondary | ICD-10-CM | POA: Diagnosis not present

## 2017-04-09 DIAGNOSIS — Z79899 Other long term (current) drug therapy: Secondary | ICD-10-CM | POA: Diagnosis not present

## 2017-04-09 DIAGNOSIS — E440 Moderate protein-calorie malnutrition: Secondary | ICD-10-CM | POA: Diagnosis not present

## 2017-04-09 DIAGNOSIS — N186 End stage renal disease: Secondary | ICD-10-CM | POA: Diagnosis not present

## 2017-04-10 DIAGNOSIS — Z5189 Encounter for other specified aftercare: Secondary | ICD-10-CM | POA: Diagnosis not present

## 2017-04-10 DIAGNOSIS — E440 Moderate protein-calorie malnutrition: Secondary | ICD-10-CM | POA: Diagnosis not present

## 2017-04-10 DIAGNOSIS — N186 End stage renal disease: Secondary | ICD-10-CM | POA: Diagnosis not present

## 2017-04-10 DIAGNOSIS — D631 Anemia in chronic kidney disease: Secondary | ICD-10-CM | POA: Diagnosis not present

## 2017-04-10 DIAGNOSIS — D509 Iron deficiency anemia, unspecified: Secondary | ICD-10-CM | POA: Diagnosis not present

## 2017-04-10 DIAGNOSIS — Z79899 Other long term (current) drug therapy: Secondary | ICD-10-CM | POA: Diagnosis not present

## 2017-04-11 DIAGNOSIS — Z79899 Other long term (current) drug therapy: Secondary | ICD-10-CM | POA: Diagnosis not present

## 2017-04-11 DIAGNOSIS — Z5189 Encounter for other specified aftercare: Secondary | ICD-10-CM | POA: Diagnosis not present

## 2017-04-11 DIAGNOSIS — D509 Iron deficiency anemia, unspecified: Secondary | ICD-10-CM | POA: Diagnosis not present

## 2017-04-11 DIAGNOSIS — E440 Moderate protein-calorie malnutrition: Secondary | ICD-10-CM | POA: Diagnosis not present

## 2017-04-11 DIAGNOSIS — N186 End stage renal disease: Secondary | ICD-10-CM | POA: Diagnosis not present

## 2017-04-11 DIAGNOSIS — D631 Anemia in chronic kidney disease: Secondary | ICD-10-CM | POA: Diagnosis not present

## 2017-04-12 DIAGNOSIS — Z79899 Other long term (current) drug therapy: Secondary | ICD-10-CM | POA: Diagnosis not present

## 2017-04-12 DIAGNOSIS — E440 Moderate protein-calorie malnutrition: Secondary | ICD-10-CM | POA: Diagnosis not present

## 2017-04-12 DIAGNOSIS — D509 Iron deficiency anemia, unspecified: Secondary | ICD-10-CM | POA: Diagnosis not present

## 2017-04-12 DIAGNOSIS — N186 End stage renal disease: Secondary | ICD-10-CM | POA: Diagnosis not present

## 2017-04-12 DIAGNOSIS — Z5189 Encounter for other specified aftercare: Secondary | ICD-10-CM | POA: Diagnosis not present

## 2017-04-12 DIAGNOSIS — D631 Anemia in chronic kidney disease: Secondary | ICD-10-CM | POA: Diagnosis not present

## 2017-04-13 DIAGNOSIS — E440 Moderate protein-calorie malnutrition: Secondary | ICD-10-CM | POA: Diagnosis not present

## 2017-04-13 DIAGNOSIS — D631 Anemia in chronic kidney disease: Secondary | ICD-10-CM | POA: Diagnosis not present

## 2017-04-13 DIAGNOSIS — N186 End stage renal disease: Secondary | ICD-10-CM | POA: Diagnosis not present

## 2017-04-13 DIAGNOSIS — Z79899 Other long term (current) drug therapy: Secondary | ICD-10-CM | POA: Diagnosis not present

## 2017-04-13 DIAGNOSIS — Z5189 Encounter for other specified aftercare: Secondary | ICD-10-CM | POA: Diagnosis not present

## 2017-04-13 DIAGNOSIS — D509 Iron deficiency anemia, unspecified: Secondary | ICD-10-CM | POA: Diagnosis not present

## 2017-04-14 DIAGNOSIS — Z79899 Other long term (current) drug therapy: Secondary | ICD-10-CM | POA: Diagnosis not present

## 2017-04-14 DIAGNOSIS — E440 Moderate protein-calorie malnutrition: Secondary | ICD-10-CM | POA: Diagnosis not present

## 2017-04-14 DIAGNOSIS — D509 Iron deficiency anemia, unspecified: Secondary | ICD-10-CM | POA: Diagnosis not present

## 2017-04-14 DIAGNOSIS — N186 End stage renal disease: Secondary | ICD-10-CM | POA: Diagnosis not present

## 2017-04-14 DIAGNOSIS — Z5189 Encounter for other specified aftercare: Secondary | ICD-10-CM | POA: Diagnosis not present

## 2017-04-14 DIAGNOSIS — D631 Anemia in chronic kidney disease: Secondary | ICD-10-CM | POA: Diagnosis not present

## 2017-04-15 DIAGNOSIS — E440 Moderate protein-calorie malnutrition: Secondary | ICD-10-CM | POA: Diagnosis not present

## 2017-04-15 DIAGNOSIS — D631 Anemia in chronic kidney disease: Secondary | ICD-10-CM | POA: Diagnosis not present

## 2017-04-15 DIAGNOSIS — D509 Iron deficiency anemia, unspecified: Secondary | ICD-10-CM | POA: Diagnosis not present

## 2017-04-15 DIAGNOSIS — Z5189 Encounter for other specified aftercare: Secondary | ICD-10-CM | POA: Diagnosis not present

## 2017-04-15 DIAGNOSIS — Z79899 Other long term (current) drug therapy: Secondary | ICD-10-CM | POA: Diagnosis not present

## 2017-04-15 DIAGNOSIS — N186 End stage renal disease: Secondary | ICD-10-CM | POA: Diagnosis not present

## 2017-04-16 DIAGNOSIS — Z5189 Encounter for other specified aftercare: Secondary | ICD-10-CM | POA: Diagnosis not present

## 2017-04-16 DIAGNOSIS — Z79899 Other long term (current) drug therapy: Secondary | ICD-10-CM | POA: Diagnosis not present

## 2017-04-16 DIAGNOSIS — D631 Anemia in chronic kidney disease: Secondary | ICD-10-CM | POA: Diagnosis not present

## 2017-04-16 DIAGNOSIS — D509 Iron deficiency anemia, unspecified: Secondary | ICD-10-CM | POA: Diagnosis not present

## 2017-04-16 DIAGNOSIS — E440 Moderate protein-calorie malnutrition: Secondary | ICD-10-CM | POA: Diagnosis not present

## 2017-04-16 DIAGNOSIS — N186 End stage renal disease: Secondary | ICD-10-CM | POA: Diagnosis not present

## 2017-04-17 DIAGNOSIS — D631 Anemia in chronic kidney disease: Secondary | ICD-10-CM | POA: Diagnosis not present

## 2017-04-17 DIAGNOSIS — Z5189 Encounter for other specified aftercare: Secondary | ICD-10-CM | POA: Diagnosis not present

## 2017-04-17 DIAGNOSIS — D509 Iron deficiency anemia, unspecified: Secondary | ICD-10-CM | POA: Diagnosis not present

## 2017-04-17 DIAGNOSIS — E440 Moderate protein-calorie malnutrition: Secondary | ICD-10-CM | POA: Diagnosis not present

## 2017-04-17 DIAGNOSIS — N186 End stage renal disease: Secondary | ICD-10-CM | POA: Diagnosis not present

## 2017-04-17 DIAGNOSIS — Z79899 Other long term (current) drug therapy: Secondary | ICD-10-CM | POA: Diagnosis not present

## 2017-04-18 DIAGNOSIS — E440 Moderate protein-calorie malnutrition: Secondary | ICD-10-CM | POA: Diagnosis not present

## 2017-04-18 DIAGNOSIS — N186 End stage renal disease: Secondary | ICD-10-CM | POA: Diagnosis not present

## 2017-04-18 DIAGNOSIS — D509 Iron deficiency anemia, unspecified: Secondary | ICD-10-CM | POA: Diagnosis not present

## 2017-04-18 DIAGNOSIS — Z79899 Other long term (current) drug therapy: Secondary | ICD-10-CM | POA: Diagnosis not present

## 2017-04-18 DIAGNOSIS — Z5189 Encounter for other specified aftercare: Secondary | ICD-10-CM | POA: Diagnosis not present

## 2017-04-18 DIAGNOSIS — D631 Anemia in chronic kidney disease: Secondary | ICD-10-CM | POA: Diagnosis not present

## 2017-04-19 DIAGNOSIS — Z79899 Other long term (current) drug therapy: Secondary | ICD-10-CM | POA: Diagnosis not present

## 2017-04-19 DIAGNOSIS — E440 Moderate protein-calorie malnutrition: Secondary | ICD-10-CM | POA: Diagnosis not present

## 2017-04-19 DIAGNOSIS — N186 End stage renal disease: Secondary | ICD-10-CM | POA: Diagnosis not present

## 2017-04-19 DIAGNOSIS — Z5189 Encounter for other specified aftercare: Secondary | ICD-10-CM | POA: Diagnosis not present

## 2017-04-19 DIAGNOSIS — D631 Anemia in chronic kidney disease: Secondary | ICD-10-CM | POA: Diagnosis not present

## 2017-04-19 DIAGNOSIS — D509 Iron deficiency anemia, unspecified: Secondary | ICD-10-CM | POA: Diagnosis not present

## 2017-04-20 DIAGNOSIS — E440 Moderate protein-calorie malnutrition: Secondary | ICD-10-CM | POA: Diagnosis not present

## 2017-04-20 DIAGNOSIS — D631 Anemia in chronic kidney disease: Secondary | ICD-10-CM | POA: Diagnosis not present

## 2017-04-20 DIAGNOSIS — N186 End stage renal disease: Secondary | ICD-10-CM | POA: Diagnosis not present

## 2017-04-20 DIAGNOSIS — D509 Iron deficiency anemia, unspecified: Secondary | ICD-10-CM | POA: Diagnosis not present

## 2017-04-20 DIAGNOSIS — Z79899 Other long term (current) drug therapy: Secondary | ICD-10-CM | POA: Diagnosis not present

## 2017-04-20 DIAGNOSIS — Z5189 Encounter for other specified aftercare: Secondary | ICD-10-CM | POA: Diagnosis not present

## 2017-04-21 DIAGNOSIS — D509 Iron deficiency anemia, unspecified: Secondary | ICD-10-CM | POA: Diagnosis not present

## 2017-04-21 DIAGNOSIS — Z79899 Other long term (current) drug therapy: Secondary | ICD-10-CM | POA: Diagnosis not present

## 2017-04-21 DIAGNOSIS — Z5189 Encounter for other specified aftercare: Secondary | ICD-10-CM | POA: Diagnosis not present

## 2017-04-21 DIAGNOSIS — D631 Anemia in chronic kidney disease: Secondary | ICD-10-CM | POA: Diagnosis not present

## 2017-04-21 DIAGNOSIS — N186 End stage renal disease: Secondary | ICD-10-CM | POA: Diagnosis not present

## 2017-04-21 DIAGNOSIS — E440 Moderate protein-calorie malnutrition: Secondary | ICD-10-CM | POA: Diagnosis not present

## 2017-04-22 DIAGNOSIS — N186 End stage renal disease: Secondary | ICD-10-CM | POA: Diagnosis not present

## 2017-04-22 DIAGNOSIS — D631 Anemia in chronic kidney disease: Secondary | ICD-10-CM | POA: Diagnosis not present

## 2017-04-22 DIAGNOSIS — Z79899 Other long term (current) drug therapy: Secondary | ICD-10-CM | POA: Diagnosis not present

## 2017-04-22 DIAGNOSIS — E440 Moderate protein-calorie malnutrition: Secondary | ICD-10-CM | POA: Diagnosis not present

## 2017-04-22 DIAGNOSIS — Z5189 Encounter for other specified aftercare: Secondary | ICD-10-CM | POA: Diagnosis not present

## 2017-04-22 DIAGNOSIS — D509 Iron deficiency anemia, unspecified: Secondary | ICD-10-CM | POA: Diagnosis not present

## 2017-04-23 DIAGNOSIS — D509 Iron deficiency anemia, unspecified: Secondary | ICD-10-CM | POA: Diagnosis not present

## 2017-04-23 DIAGNOSIS — Z79899 Other long term (current) drug therapy: Secondary | ICD-10-CM | POA: Diagnosis not present

## 2017-04-23 DIAGNOSIS — N186 End stage renal disease: Secondary | ICD-10-CM | POA: Diagnosis not present

## 2017-04-23 DIAGNOSIS — E440 Moderate protein-calorie malnutrition: Secondary | ICD-10-CM | POA: Diagnosis not present

## 2017-04-23 DIAGNOSIS — Z5189 Encounter for other specified aftercare: Secondary | ICD-10-CM | POA: Diagnosis not present

## 2017-04-23 DIAGNOSIS — D631 Anemia in chronic kidney disease: Secondary | ICD-10-CM | POA: Diagnosis not present

## 2017-04-24 DIAGNOSIS — Z5189 Encounter for other specified aftercare: Secondary | ICD-10-CM | POA: Diagnosis not present

## 2017-04-24 DIAGNOSIS — N186 End stage renal disease: Secondary | ICD-10-CM | POA: Diagnosis not present

## 2017-04-24 DIAGNOSIS — E440 Moderate protein-calorie malnutrition: Secondary | ICD-10-CM | POA: Diagnosis not present

## 2017-04-24 DIAGNOSIS — D631 Anemia in chronic kidney disease: Secondary | ICD-10-CM | POA: Diagnosis not present

## 2017-04-24 DIAGNOSIS — D509 Iron deficiency anemia, unspecified: Secondary | ICD-10-CM | POA: Diagnosis not present

## 2017-04-24 DIAGNOSIS — Z79899 Other long term (current) drug therapy: Secondary | ICD-10-CM | POA: Diagnosis not present

## 2017-04-25 ENCOUNTER — Ambulatory Visit: Payer: Medicare Other | Admitting: Family Medicine

## 2017-04-25 DIAGNOSIS — Z5189 Encounter for other specified aftercare: Secondary | ICD-10-CM | POA: Diagnosis not present

## 2017-04-25 DIAGNOSIS — D631 Anemia in chronic kidney disease: Secondary | ICD-10-CM | POA: Diagnosis not present

## 2017-04-25 DIAGNOSIS — E440 Moderate protein-calorie malnutrition: Secondary | ICD-10-CM | POA: Diagnosis not present

## 2017-04-25 DIAGNOSIS — D509 Iron deficiency anemia, unspecified: Secondary | ICD-10-CM | POA: Diagnosis not present

## 2017-04-25 DIAGNOSIS — Z79899 Other long term (current) drug therapy: Secondary | ICD-10-CM | POA: Diagnosis not present

## 2017-04-25 DIAGNOSIS — N186 End stage renal disease: Secondary | ICD-10-CM | POA: Diagnosis not present

## 2017-04-26 ENCOUNTER — Other Ambulatory Visit: Payer: Self-pay

## 2017-04-26 DIAGNOSIS — D509 Iron deficiency anemia, unspecified: Secondary | ICD-10-CM | POA: Diagnosis not present

## 2017-04-26 DIAGNOSIS — E440 Moderate protein-calorie malnutrition: Secondary | ICD-10-CM | POA: Diagnosis not present

## 2017-04-26 DIAGNOSIS — Z5189 Encounter for other specified aftercare: Secondary | ICD-10-CM | POA: Diagnosis not present

## 2017-04-26 DIAGNOSIS — Z79899 Other long term (current) drug therapy: Secondary | ICD-10-CM | POA: Diagnosis not present

## 2017-04-26 DIAGNOSIS — D631 Anemia in chronic kidney disease: Secondary | ICD-10-CM | POA: Diagnosis not present

## 2017-04-26 DIAGNOSIS — N186 End stage renal disease: Secondary | ICD-10-CM | POA: Diagnosis not present

## 2017-04-26 DIAGNOSIS — I129 Hypertensive chronic kidney disease with stage 1 through stage 4 chronic kidney disease, or unspecified chronic kidney disease: Secondary | ICD-10-CM | POA: Diagnosis not present

## 2017-04-26 DIAGNOSIS — Z992 Dependence on renal dialysis: Secondary | ICD-10-CM | POA: Diagnosis not present

## 2017-04-26 MED ORDER — LEVETIRACETAM 250 MG PO TABS: 250.0000 mg | ORAL_TABLET | Freq: Two times a day (BID) | ORAL | 1 refills | Status: DC

## 2017-04-27 DIAGNOSIS — D509 Iron deficiency anemia, unspecified: Secondary | ICD-10-CM | POA: Diagnosis not present

## 2017-04-27 DIAGNOSIS — D631 Anemia in chronic kidney disease: Secondary | ICD-10-CM | POA: Diagnosis not present

## 2017-04-27 DIAGNOSIS — E440 Moderate protein-calorie malnutrition: Secondary | ICD-10-CM | POA: Diagnosis not present

## 2017-04-27 DIAGNOSIS — Z79899 Other long term (current) drug therapy: Secondary | ICD-10-CM | POA: Diagnosis not present

## 2017-04-27 DIAGNOSIS — Z5189 Encounter for other specified aftercare: Secondary | ICD-10-CM | POA: Diagnosis not present

## 2017-04-27 DIAGNOSIS — N2581 Secondary hyperparathyroidism of renal origin: Secondary | ICD-10-CM | POA: Diagnosis not present

## 2017-04-27 DIAGNOSIS — N186 End stage renal disease: Secondary | ICD-10-CM | POA: Diagnosis not present

## 2017-04-28 DIAGNOSIS — N186 End stage renal disease: Secondary | ICD-10-CM | POA: Diagnosis not present

## 2017-04-28 DIAGNOSIS — Z5189 Encounter for other specified aftercare: Secondary | ICD-10-CM | POA: Diagnosis not present

## 2017-04-28 DIAGNOSIS — D509 Iron deficiency anemia, unspecified: Secondary | ICD-10-CM | POA: Diagnosis not present

## 2017-04-28 DIAGNOSIS — N2581 Secondary hyperparathyroidism of renal origin: Secondary | ICD-10-CM | POA: Diagnosis not present

## 2017-04-28 DIAGNOSIS — D631 Anemia in chronic kidney disease: Secondary | ICD-10-CM | POA: Diagnosis not present

## 2017-04-28 DIAGNOSIS — Z79899 Other long term (current) drug therapy: Secondary | ICD-10-CM | POA: Diagnosis not present

## 2017-04-29 DIAGNOSIS — D509 Iron deficiency anemia, unspecified: Secondary | ICD-10-CM | POA: Diagnosis not present

## 2017-04-29 DIAGNOSIS — N186 End stage renal disease: Secondary | ICD-10-CM | POA: Diagnosis not present

## 2017-04-29 DIAGNOSIS — Z79899 Other long term (current) drug therapy: Secondary | ICD-10-CM | POA: Diagnosis not present

## 2017-04-29 DIAGNOSIS — N2581 Secondary hyperparathyroidism of renal origin: Secondary | ICD-10-CM | POA: Diagnosis not present

## 2017-04-29 DIAGNOSIS — Z5189 Encounter for other specified aftercare: Secondary | ICD-10-CM | POA: Diagnosis not present

## 2017-04-29 DIAGNOSIS — D631 Anemia in chronic kidney disease: Secondary | ICD-10-CM | POA: Diagnosis not present

## 2017-04-30 ENCOUNTER — Encounter: Payer: Medicare Other | Admitting: Physical Medicine & Rehabilitation

## 2017-04-30 DIAGNOSIS — Z5189 Encounter for other specified aftercare: Secondary | ICD-10-CM | POA: Diagnosis not present

## 2017-04-30 DIAGNOSIS — D631 Anemia in chronic kidney disease: Secondary | ICD-10-CM | POA: Diagnosis not present

## 2017-04-30 DIAGNOSIS — D509 Iron deficiency anemia, unspecified: Secondary | ICD-10-CM | POA: Diagnosis not present

## 2017-04-30 DIAGNOSIS — N2581 Secondary hyperparathyroidism of renal origin: Secondary | ICD-10-CM | POA: Diagnosis not present

## 2017-04-30 DIAGNOSIS — Z79899 Other long term (current) drug therapy: Secondary | ICD-10-CM | POA: Diagnosis not present

## 2017-04-30 DIAGNOSIS — R8299 Other abnormal findings in urine: Secondary | ICD-10-CM | POA: Diagnosis not present

## 2017-04-30 DIAGNOSIS — N186 End stage renal disease: Secondary | ICD-10-CM | POA: Diagnosis not present

## 2017-05-01 DIAGNOSIS — Z79899 Other long term (current) drug therapy: Secondary | ICD-10-CM | POA: Diagnosis not present

## 2017-05-01 DIAGNOSIS — D509 Iron deficiency anemia, unspecified: Secondary | ICD-10-CM | POA: Diagnosis not present

## 2017-05-01 DIAGNOSIS — N186 End stage renal disease: Secondary | ICD-10-CM | POA: Diagnosis not present

## 2017-05-01 DIAGNOSIS — D631 Anemia in chronic kidney disease: Secondary | ICD-10-CM | POA: Diagnosis not present

## 2017-05-01 DIAGNOSIS — Z5189 Encounter for other specified aftercare: Secondary | ICD-10-CM | POA: Diagnosis not present

## 2017-05-01 DIAGNOSIS — N2581 Secondary hyperparathyroidism of renal origin: Secondary | ICD-10-CM | POA: Diagnosis not present

## 2017-05-02 DIAGNOSIS — Z5189 Encounter for other specified aftercare: Secondary | ICD-10-CM | POA: Diagnosis not present

## 2017-05-02 DIAGNOSIS — N2581 Secondary hyperparathyroidism of renal origin: Secondary | ICD-10-CM | POA: Diagnosis not present

## 2017-05-02 DIAGNOSIS — D631 Anemia in chronic kidney disease: Secondary | ICD-10-CM | POA: Diagnosis not present

## 2017-05-02 DIAGNOSIS — Z79899 Other long term (current) drug therapy: Secondary | ICD-10-CM | POA: Diagnosis not present

## 2017-05-02 DIAGNOSIS — D509 Iron deficiency anemia, unspecified: Secondary | ICD-10-CM | POA: Diagnosis not present

## 2017-05-02 DIAGNOSIS — N186 End stage renal disease: Secondary | ICD-10-CM | POA: Diagnosis not present

## 2017-05-03 ENCOUNTER — Other Ambulatory Visit: Payer: Self-pay | Admitting: *Deleted

## 2017-05-03 DIAGNOSIS — Z5189 Encounter for other specified aftercare: Secondary | ICD-10-CM | POA: Diagnosis not present

## 2017-05-03 DIAGNOSIS — D509 Iron deficiency anemia, unspecified: Secondary | ICD-10-CM | POA: Diagnosis not present

## 2017-05-03 DIAGNOSIS — N2581 Secondary hyperparathyroidism of renal origin: Secondary | ICD-10-CM | POA: Diagnosis not present

## 2017-05-03 DIAGNOSIS — D631 Anemia in chronic kidney disease: Secondary | ICD-10-CM | POA: Diagnosis not present

## 2017-05-03 DIAGNOSIS — N186 End stage renal disease: Secondary | ICD-10-CM | POA: Diagnosis not present

## 2017-05-03 DIAGNOSIS — Z79899 Other long term (current) drug therapy: Secondary | ICD-10-CM | POA: Diagnosis not present

## 2017-05-03 MED ORDER — LEVETIRACETAM 250 MG PO TABS: 250.0000 mg | ORAL_TABLET | Freq: Two times a day (BID) | ORAL | 1 refills | Status: DC

## 2017-05-04 ENCOUNTER — Ambulatory Visit: Payer: Medicare Other | Admitting: Family Medicine

## 2017-05-04 ENCOUNTER — Ambulatory Visit (INDEPENDENT_AMBULATORY_CARE_PROVIDER_SITE_OTHER): Payer: Medicare Other | Admitting: Family Medicine

## 2017-05-04 ENCOUNTER — Encounter: Payer: Self-pay | Admitting: Family Medicine

## 2017-05-04 VITALS — BP 122/80 | HR 75 | Wt 156.8 lb

## 2017-05-04 DIAGNOSIS — R05 Cough: Secondary | ICD-10-CM

## 2017-05-04 DIAGNOSIS — R059 Cough, unspecified: Secondary | ICD-10-CM

## 2017-05-04 DIAGNOSIS — N186 End stage renal disease: Secondary | ICD-10-CM | POA: Diagnosis not present

## 2017-05-04 DIAGNOSIS — R109 Unspecified abdominal pain: Secondary | ICD-10-CM | POA: Diagnosis not present

## 2017-05-04 DIAGNOSIS — D509 Iron deficiency anemia, unspecified: Secondary | ICD-10-CM | POA: Diagnosis not present

## 2017-05-04 DIAGNOSIS — Z79899 Other long term (current) drug therapy: Secondary | ICD-10-CM | POA: Diagnosis not present

## 2017-05-04 DIAGNOSIS — D631 Anemia in chronic kidney disease: Secondary | ICD-10-CM | POA: Diagnosis not present

## 2017-05-04 DIAGNOSIS — Z5189 Encounter for other specified aftercare: Secondary | ICD-10-CM | POA: Diagnosis not present

## 2017-05-04 DIAGNOSIS — N2581 Secondary hyperparathyroidism of renal origin: Secondary | ICD-10-CM | POA: Diagnosis not present

## 2017-05-04 MED ORDER — BENZONATATE 100 MG PO CAPS: 100.0000 mg | ORAL_CAPSULE | Freq: Three times a day (TID) | ORAL | 0 refills | Status: DC | PRN

## 2017-05-04 NOTE — Progress Notes (Signed)
   Subjective:    Patient ID: Jared [name], male    DOB: 1956-02-10, 61 y.o.   MRN: [id number]  HPI Chief Complaint  Patient presents with  . follow-up    follow-up   He is here for follow up on cough. States his cough is still dry and at least 75% improved. Only coughing a few times during the day. Cough is not keeping him awake. Nothing seems to aggravate his cough.  He would like a refill of Tessalon  to have on hand.  Complains of right side pain that has been ongoing and unchanged. States this has been present since his hospitilization in February. States sometimes his pain is in his back and other times on his left side. States pain moves around. Occasionally pain is worse with movement but other times pain is worse when getting out of bed and improves with movement.  States his nephrologist and urologist are both aware of his right side pain. States he has had CT scans and worked up for this but no explanation as to what is causing the pain.   Has an appointment Monday with his urologist to follow up on prostate cancer. States he is getting an injection every 6 months.   He is under the care of Dr. Loanne Drilling for his diabetes. Reports readings are much better. No concerns regarding his diabetes.   Denies fever, chills, rhinorrhea, nasal congestion, sore throat, chest pain, palpitations, shortness of breath, wheezing, N/V/D.   Reviewed allergies, medications, past medical, surgical,and social history.   Review of Systems Pertinent positives and negatives in the history of present illness.     Objective:   Physical Exam  Constitutional: He appears well-developed and well-nourished. No distress.  HENT:  Nose: Nose normal.  Mouth/Throat: Uvula is midline, oropharynx is clear and moist and mucous membranes are normal.  Neck: Normal range of motion. Neck supple.  Cardiovascular: Normal rate, regular rhythm, normal heart sounds and normal pulses.   No LE edema.   Pulmonary/Chest:  Effort normal and breath sounds normal.  Abdominal: Soft. Normal appearance and bowel sounds are normal. There is no hepatosplenomegaly. There is no tenderness. There is no rigidity, no rebound, no guarding, no CVA tenderness, no tenderness at McBurney's point and negative Murphy's sign.  Skin: Skin is warm and dry. No rash noted. No pallor.   BP 122/80   Pulse 75   Wt 156 lb 12.8 oz (71.1 kg)   BMI 23.98 kg/m       Assessment & Plan:  Cough - Plan: benzonatate (TESSALON) 100 MG capsule, DISCONTINUED: benzonatate (TESSALON) 100 MG capsule  Side pain  His cough is at least 75% improved. Still no obvious explanation for this. Will refill Tessalon as requested.  Discussed that his right side pain appears to be musculoskeletal but unable to tell him exactly the etiology. The fact that his pain "moves around" and is not worsening speaks to this not being anything serious. He will also discuss this with his urologist next week and his nephrologist. He is doing daily peritoneal dialysis and cannot rule out that this may be related to this.  Plan to have him follow up for an AWV in the next 1-2 months.

## 2017-05-05 DIAGNOSIS — N2581 Secondary hyperparathyroidism of renal origin: Secondary | ICD-10-CM | POA: Diagnosis not present

## 2017-05-05 DIAGNOSIS — Z79899 Other long term (current) drug therapy: Secondary | ICD-10-CM | POA: Diagnosis not present

## 2017-05-05 DIAGNOSIS — D631 Anemia in chronic kidney disease: Secondary | ICD-10-CM | POA: Diagnosis not present

## 2017-05-05 DIAGNOSIS — D509 Iron deficiency anemia, unspecified: Secondary | ICD-10-CM | POA: Diagnosis not present

## 2017-05-05 DIAGNOSIS — N186 End stage renal disease: Secondary | ICD-10-CM | POA: Diagnosis not present

## 2017-05-05 DIAGNOSIS — Z5189 Encounter for other specified aftercare: Secondary | ICD-10-CM | POA: Diagnosis not present

## 2017-05-06 DIAGNOSIS — N2581 Secondary hyperparathyroidism of renal origin: Secondary | ICD-10-CM | POA: Diagnosis not present

## 2017-05-06 DIAGNOSIS — N186 End stage renal disease: Secondary | ICD-10-CM | POA: Diagnosis not present

## 2017-05-06 DIAGNOSIS — D631 Anemia in chronic kidney disease: Secondary | ICD-10-CM | POA: Diagnosis not present

## 2017-05-06 DIAGNOSIS — Z5189 Encounter for other specified aftercare: Secondary | ICD-10-CM | POA: Diagnosis not present

## 2017-05-06 DIAGNOSIS — Z79899 Other long term (current) drug therapy: Secondary | ICD-10-CM | POA: Diagnosis not present

## 2017-05-06 DIAGNOSIS — D509 Iron deficiency anemia, unspecified: Secondary | ICD-10-CM | POA: Diagnosis not present

## 2017-05-07 DIAGNOSIS — N186 End stage renal disease: Secondary | ICD-10-CM | POA: Diagnosis not present

## 2017-05-07 DIAGNOSIS — Z5189 Encounter for other specified aftercare: Secondary | ICD-10-CM | POA: Diagnosis not present

## 2017-05-07 DIAGNOSIS — N2581 Secondary hyperparathyroidism of renal origin: Secondary | ICD-10-CM | POA: Diagnosis not present

## 2017-05-07 DIAGNOSIS — D631 Anemia in chronic kidney disease: Secondary | ICD-10-CM | POA: Diagnosis not present

## 2017-05-07 DIAGNOSIS — Z79899 Other long term (current) drug therapy: Secondary | ICD-10-CM | POA: Diagnosis not present

## 2017-05-07 DIAGNOSIS — D509 Iron deficiency anemia, unspecified: Secondary | ICD-10-CM | POA: Diagnosis not present

## 2017-05-08 DIAGNOSIS — Z79899 Other long term (current) drug therapy: Secondary | ICD-10-CM | POA: Diagnosis not present

## 2017-05-08 DIAGNOSIS — N2581 Secondary hyperparathyroidism of renal origin: Secondary | ICD-10-CM | POA: Diagnosis not present

## 2017-05-08 DIAGNOSIS — N186 End stage renal disease: Secondary | ICD-10-CM | POA: Diagnosis not present

## 2017-05-08 DIAGNOSIS — Z5189 Encounter for other specified aftercare: Secondary | ICD-10-CM | POA: Diagnosis not present

## 2017-05-08 DIAGNOSIS — D631 Anemia in chronic kidney disease: Secondary | ICD-10-CM | POA: Diagnosis not present

## 2017-05-08 DIAGNOSIS — D509 Iron deficiency anemia, unspecified: Secondary | ICD-10-CM | POA: Diagnosis not present

## 2017-05-09 DIAGNOSIS — Z79899 Other long term (current) drug therapy: Secondary | ICD-10-CM | POA: Diagnosis not present

## 2017-05-09 DIAGNOSIS — N2581 Secondary hyperparathyroidism of renal origin: Secondary | ICD-10-CM | POA: Diagnosis not present

## 2017-05-09 DIAGNOSIS — D631 Anemia in chronic kidney disease: Secondary | ICD-10-CM | POA: Diagnosis not present

## 2017-05-09 DIAGNOSIS — D509 Iron deficiency anemia, unspecified: Secondary | ICD-10-CM | POA: Diagnosis not present

## 2017-05-09 DIAGNOSIS — Z5189 Encounter for other specified aftercare: Secondary | ICD-10-CM | POA: Diagnosis not present

## 2017-05-09 DIAGNOSIS — N186 End stage renal disease: Secondary | ICD-10-CM | POA: Diagnosis not present

## 2017-05-10 DIAGNOSIS — D509 Iron deficiency anemia, unspecified: Secondary | ICD-10-CM | POA: Diagnosis not present

## 2017-05-10 DIAGNOSIS — D631 Anemia in chronic kidney disease: Secondary | ICD-10-CM | POA: Diagnosis not present

## 2017-05-10 DIAGNOSIS — N186 End stage renal disease: Secondary | ICD-10-CM | POA: Diagnosis not present

## 2017-05-10 DIAGNOSIS — Z5189 Encounter for other specified aftercare: Secondary | ICD-10-CM | POA: Diagnosis not present

## 2017-05-10 DIAGNOSIS — Z79899 Other long term (current) drug therapy: Secondary | ICD-10-CM | POA: Diagnosis not present

## 2017-05-10 DIAGNOSIS — N2581 Secondary hyperparathyroidism of renal origin: Secondary | ICD-10-CM | POA: Diagnosis not present

## 2017-05-11 DIAGNOSIS — N2581 Secondary hyperparathyroidism of renal origin: Secondary | ICD-10-CM | POA: Diagnosis not present

## 2017-05-11 DIAGNOSIS — D509 Iron deficiency anemia, unspecified: Secondary | ICD-10-CM | POA: Diagnosis not present

## 2017-05-11 DIAGNOSIS — N186 End stage renal disease: Secondary | ICD-10-CM | POA: Diagnosis not present

## 2017-05-11 DIAGNOSIS — Z5189 Encounter for other specified aftercare: Secondary | ICD-10-CM | POA: Diagnosis not present

## 2017-05-11 DIAGNOSIS — D631 Anemia in chronic kidney disease: Secondary | ICD-10-CM | POA: Diagnosis not present

## 2017-05-11 DIAGNOSIS — Z79899 Other long term (current) drug therapy: Secondary | ICD-10-CM | POA: Diagnosis not present

## 2017-05-12 DIAGNOSIS — D509 Iron deficiency anemia, unspecified: Secondary | ICD-10-CM | POA: Diagnosis not present

## 2017-05-12 DIAGNOSIS — D631 Anemia in chronic kidney disease: Secondary | ICD-10-CM | POA: Diagnosis not present

## 2017-05-12 DIAGNOSIS — N186 End stage renal disease: Secondary | ICD-10-CM | POA: Diagnosis not present

## 2017-05-12 DIAGNOSIS — Z5189 Encounter for other specified aftercare: Secondary | ICD-10-CM | POA: Diagnosis not present

## 2017-05-12 DIAGNOSIS — N2581 Secondary hyperparathyroidism of renal origin: Secondary | ICD-10-CM | POA: Diagnosis not present

## 2017-05-12 DIAGNOSIS — Z79899 Other long term (current) drug therapy: Secondary | ICD-10-CM | POA: Diagnosis not present

## 2017-05-13 DIAGNOSIS — Z5189 Encounter for other specified aftercare: Secondary | ICD-10-CM | POA: Diagnosis not present

## 2017-05-13 DIAGNOSIS — D631 Anemia in chronic kidney disease: Secondary | ICD-10-CM | POA: Diagnosis not present

## 2017-05-13 DIAGNOSIS — D509 Iron deficiency anemia, unspecified: Secondary | ICD-10-CM | POA: Diagnosis not present

## 2017-05-13 DIAGNOSIS — Z79899 Other long term (current) drug therapy: Secondary | ICD-10-CM | POA: Diagnosis not present

## 2017-05-13 DIAGNOSIS — N186 End stage renal disease: Secondary | ICD-10-CM | POA: Diagnosis not present

## 2017-05-13 DIAGNOSIS — N2581 Secondary hyperparathyroidism of renal origin: Secondary | ICD-10-CM | POA: Diagnosis not present

## 2017-05-14 DIAGNOSIS — N2581 Secondary hyperparathyroidism of renal origin: Secondary | ICD-10-CM | POA: Diagnosis not present

## 2017-05-14 DIAGNOSIS — Z5189 Encounter for other specified aftercare: Secondary | ICD-10-CM | POA: Diagnosis not present

## 2017-05-14 DIAGNOSIS — D631 Anemia in chronic kidney disease: Secondary | ICD-10-CM | POA: Diagnosis not present

## 2017-05-14 DIAGNOSIS — C775 Secondary and unspecified malignant neoplasm of intrapelvic lymph nodes: Secondary | ICD-10-CM | POA: Diagnosis not present

## 2017-05-14 DIAGNOSIS — D509 Iron deficiency anemia, unspecified: Secondary | ICD-10-CM | POA: Diagnosis not present

## 2017-05-14 DIAGNOSIS — Z79899 Other long term (current) drug therapy: Secondary | ICD-10-CM | POA: Diagnosis not present

## 2017-05-14 DIAGNOSIS — C61 Malignant neoplasm of prostate: Secondary | ICD-10-CM | POA: Diagnosis not present

## 2017-05-14 DIAGNOSIS — N186 End stage renal disease: Secondary | ICD-10-CM | POA: Diagnosis not present

## 2017-05-14 DIAGNOSIS — N281 Cyst of kidney, acquired: Secondary | ICD-10-CM | POA: Diagnosis not present

## 2017-05-15 DIAGNOSIS — Z79899 Other long term (current) drug therapy: Secondary | ICD-10-CM | POA: Diagnosis not present

## 2017-05-15 DIAGNOSIS — D631 Anemia in chronic kidney disease: Secondary | ICD-10-CM | POA: Diagnosis not present

## 2017-05-15 DIAGNOSIS — N2581 Secondary hyperparathyroidism of renal origin: Secondary | ICD-10-CM | POA: Diagnosis not present

## 2017-05-15 DIAGNOSIS — D509 Iron deficiency anemia, unspecified: Secondary | ICD-10-CM | POA: Diagnosis not present

## 2017-05-15 DIAGNOSIS — N186 End stage renal disease: Secondary | ICD-10-CM | POA: Diagnosis not present

## 2017-05-15 DIAGNOSIS — Z5189 Encounter for other specified aftercare: Secondary | ICD-10-CM | POA: Diagnosis not present

## 2017-05-16 DIAGNOSIS — Z79899 Other long term (current) drug therapy: Secondary | ICD-10-CM | POA: Diagnosis not present

## 2017-05-16 DIAGNOSIS — N186 End stage renal disease: Secondary | ICD-10-CM | POA: Diagnosis not present

## 2017-05-16 DIAGNOSIS — N2581 Secondary hyperparathyroidism of renal origin: Secondary | ICD-10-CM | POA: Diagnosis not present

## 2017-05-16 DIAGNOSIS — Z5189 Encounter for other specified aftercare: Secondary | ICD-10-CM | POA: Diagnosis not present

## 2017-05-16 DIAGNOSIS — D631 Anemia in chronic kidney disease: Secondary | ICD-10-CM | POA: Diagnosis not present

## 2017-05-16 DIAGNOSIS — D509 Iron deficiency anemia, unspecified: Secondary | ICD-10-CM | POA: Diagnosis not present

## 2017-05-17 DIAGNOSIS — D631 Anemia in chronic kidney disease: Secondary | ICD-10-CM | POA: Diagnosis not present

## 2017-05-17 DIAGNOSIS — N186 End stage renal disease: Secondary | ICD-10-CM | POA: Diagnosis not present

## 2017-05-17 DIAGNOSIS — N2581 Secondary hyperparathyroidism of renal origin: Secondary | ICD-10-CM | POA: Diagnosis not present

## 2017-05-17 DIAGNOSIS — D509 Iron deficiency anemia, unspecified: Secondary | ICD-10-CM | POA: Diagnosis not present

## 2017-05-17 DIAGNOSIS — Z5189 Encounter for other specified aftercare: Secondary | ICD-10-CM | POA: Diagnosis not present

## 2017-05-17 DIAGNOSIS — Z79899 Other long term (current) drug therapy: Secondary | ICD-10-CM | POA: Diagnosis not present

## 2017-05-18 ENCOUNTER — Telehealth: Payer: Self-pay | Admitting: Family Medicine

## 2017-05-18 DIAGNOSIS — N186 End stage renal disease: Secondary | ICD-10-CM | POA: Diagnosis not present

## 2017-05-18 DIAGNOSIS — Z79899 Other long term (current) drug therapy: Secondary | ICD-10-CM | POA: Diagnosis not present

## 2017-05-18 DIAGNOSIS — D631 Anemia in chronic kidney disease: Secondary | ICD-10-CM | POA: Diagnosis not present

## 2017-05-18 DIAGNOSIS — N2581 Secondary hyperparathyroidism of renal origin: Secondary | ICD-10-CM | POA: Diagnosis not present

## 2017-05-18 DIAGNOSIS — Z5189 Encounter for other specified aftercare: Secondary | ICD-10-CM | POA: Diagnosis not present

## 2017-05-18 DIAGNOSIS — D509 Iron deficiency anemia, unspecified: Secondary | ICD-10-CM | POA: Diagnosis not present

## 2017-05-18 NOTE — Telephone Encounter (Signed)
Pt was notified of results

## 2017-05-18 NOTE — Telephone Encounter (Signed)
I prefer he call his nephrologist about his blood pressures because of his severe kidney disease. I don't want to go changing up his medication when this is most likely related to his renal issues.

## 2017-05-18 NOTE — Telephone Encounter (Signed)
Pt called stating that over the last several days his blood pressure has been high and up and down. For example yesterday morning it was 180/95, Pt said other days, it has been 160/93, 160's/96, 170's/95. Pt wants to know if he need to come in for an appointment

## 2017-05-19 DIAGNOSIS — N2581 Secondary hyperparathyroidism of renal origin: Secondary | ICD-10-CM | POA: Diagnosis not present

## 2017-05-19 DIAGNOSIS — D509 Iron deficiency anemia, unspecified: Secondary | ICD-10-CM | POA: Diagnosis not present

## 2017-05-19 DIAGNOSIS — D631 Anemia in chronic kidney disease: Secondary | ICD-10-CM | POA: Diagnosis not present

## 2017-05-19 DIAGNOSIS — N186 End stage renal disease: Secondary | ICD-10-CM | POA: Diagnosis not present

## 2017-05-19 DIAGNOSIS — Z5189 Encounter for other specified aftercare: Secondary | ICD-10-CM | POA: Diagnosis not present

## 2017-05-19 DIAGNOSIS — Z79899 Other long term (current) drug therapy: Secondary | ICD-10-CM | POA: Diagnosis not present

## 2017-05-20 DIAGNOSIS — N186 End stage renal disease: Secondary | ICD-10-CM | POA: Diagnosis not present

## 2017-05-20 DIAGNOSIS — Z5189 Encounter for other specified aftercare: Secondary | ICD-10-CM | POA: Diagnosis not present

## 2017-05-20 DIAGNOSIS — Z79899 Other long term (current) drug therapy: Secondary | ICD-10-CM | POA: Diagnosis not present

## 2017-05-20 DIAGNOSIS — D631 Anemia in chronic kidney disease: Secondary | ICD-10-CM | POA: Diagnosis not present

## 2017-05-20 DIAGNOSIS — N2581 Secondary hyperparathyroidism of renal origin: Secondary | ICD-10-CM | POA: Diagnosis not present

## 2017-05-20 DIAGNOSIS — D509 Iron deficiency anemia, unspecified: Secondary | ICD-10-CM | POA: Diagnosis not present

## 2017-05-21 ENCOUNTER — Telehealth: Payer: Self-pay

## 2017-05-21 DIAGNOSIS — N2581 Secondary hyperparathyroidism of renal origin: Secondary | ICD-10-CM | POA: Diagnosis not present

## 2017-05-21 DIAGNOSIS — Z5189 Encounter for other specified aftercare: Secondary | ICD-10-CM | POA: Diagnosis not present

## 2017-05-21 DIAGNOSIS — D509 Iron deficiency anemia, unspecified: Secondary | ICD-10-CM | POA: Diagnosis not present

## 2017-05-21 DIAGNOSIS — N186 End stage renal disease: Secondary | ICD-10-CM | POA: Diagnosis not present

## 2017-05-21 DIAGNOSIS — D631 Anemia in chronic kidney disease: Secondary | ICD-10-CM | POA: Diagnosis not present

## 2017-05-21 DIAGNOSIS — Z79899 Other long term (current) drug therapy: Secondary | ICD-10-CM | POA: Diagnosis not present

## 2017-05-21 MED ORDER — ISOSORBIDE MONONITRATE ER 30 MG PO TB24: 30.0000 mg | ORAL_TABLET | Freq: Every day | ORAL | 1 refills | Status: DC

## 2017-05-21 MED ORDER — METOPROLOL TARTRATE 100 MG PO TABS: 100.0000 mg | ORAL_TABLET | Freq: Two times a day (BID) | ORAL | 1 refills | Status: AC

## 2017-05-21 MED ORDER — PANTOPRAZOLE SODIUM 40 MG PO TBEC: 40.0000 mg | DELAYED_RELEASE_TABLET | Freq: Every day | ORAL | 1 refills | Status: DC

## 2017-05-21 NOTE — Telephone Encounter (Signed)
Pt needs refills of Lopressor, Protonix,and Imdur called to [facility] on [address].

## 2017-05-21 NOTE — Telephone Encounter (Signed)
Refilled med for 2 months

## 2017-05-22 DIAGNOSIS — D509 Iron deficiency anemia, unspecified: Secondary | ICD-10-CM | POA: Diagnosis not present

## 2017-05-22 DIAGNOSIS — N2581 Secondary hyperparathyroidism of renal origin: Secondary | ICD-10-CM | POA: Diagnosis not present

## 2017-05-22 DIAGNOSIS — D631 Anemia in chronic kidney disease: Secondary | ICD-10-CM | POA: Diagnosis not present

## 2017-05-22 DIAGNOSIS — Z79899 Other long term (current) drug therapy: Secondary | ICD-10-CM | POA: Diagnosis not present

## 2017-05-22 DIAGNOSIS — N186 End stage renal disease: Secondary | ICD-10-CM | POA: Diagnosis not present

## 2017-05-22 DIAGNOSIS — Z5189 Encounter for other specified aftercare: Secondary | ICD-10-CM | POA: Diagnosis not present

## 2017-05-23 DIAGNOSIS — Z79899 Other long term (current) drug therapy: Secondary | ICD-10-CM | POA: Diagnosis not present

## 2017-05-23 DIAGNOSIS — D509 Iron deficiency anemia, unspecified: Secondary | ICD-10-CM | POA: Diagnosis not present

## 2017-05-23 DIAGNOSIS — N186 End stage renal disease: Secondary | ICD-10-CM | POA: Diagnosis not present

## 2017-05-23 DIAGNOSIS — N2581 Secondary hyperparathyroidism of renal origin: Secondary | ICD-10-CM | POA: Diagnosis not present

## 2017-05-23 DIAGNOSIS — Z5189 Encounter for other specified aftercare: Secondary | ICD-10-CM | POA: Diagnosis not present

## 2017-05-23 DIAGNOSIS — D631 Anemia in chronic kidney disease: Secondary | ICD-10-CM | POA: Diagnosis not present

## 2017-05-24 DIAGNOSIS — N2581 Secondary hyperparathyroidism of renal origin: Secondary | ICD-10-CM | POA: Diagnosis not present

## 2017-05-24 DIAGNOSIS — D509 Iron deficiency anemia, unspecified: Secondary | ICD-10-CM | POA: Diagnosis not present

## 2017-05-24 DIAGNOSIS — D631 Anemia in chronic kidney disease: Secondary | ICD-10-CM | POA: Diagnosis not present

## 2017-05-24 DIAGNOSIS — Z79899 Other long term (current) drug therapy: Secondary | ICD-10-CM | POA: Diagnosis not present

## 2017-05-24 DIAGNOSIS — Z5189 Encounter for other specified aftercare: Secondary | ICD-10-CM | POA: Diagnosis not present

## 2017-05-24 DIAGNOSIS — N186 End stage renal disease: Secondary | ICD-10-CM | POA: Diagnosis not present

## 2017-05-25 DIAGNOSIS — N186 End stage renal disease: Secondary | ICD-10-CM | POA: Diagnosis not present

## 2017-05-25 DIAGNOSIS — Z5189 Encounter for other specified aftercare: Secondary | ICD-10-CM | POA: Diagnosis not present

## 2017-05-25 DIAGNOSIS — D509 Iron deficiency anemia, unspecified: Secondary | ICD-10-CM | POA: Diagnosis not present

## 2017-05-25 DIAGNOSIS — N2581 Secondary hyperparathyroidism of renal origin: Secondary | ICD-10-CM | POA: Diagnosis not present

## 2017-05-25 DIAGNOSIS — D631 Anemia in chronic kidney disease: Secondary | ICD-10-CM | POA: Diagnosis not present

## 2017-05-25 DIAGNOSIS — Z79899 Other long term (current) drug therapy: Secondary | ICD-10-CM | POA: Diagnosis not present

## 2017-05-26 DIAGNOSIS — Z79899 Other long term (current) drug therapy: Secondary | ICD-10-CM | POA: Diagnosis not present

## 2017-05-26 DIAGNOSIS — I129 Hypertensive chronic kidney disease with stage 1 through stage 4 chronic kidney disease, or unspecified chronic kidney disease: Secondary | ICD-10-CM | POA: Diagnosis not present

## 2017-05-26 DIAGNOSIS — D509 Iron deficiency anemia, unspecified: Secondary | ICD-10-CM | POA: Diagnosis not present

## 2017-05-26 DIAGNOSIS — Z5189 Encounter for other specified aftercare: Secondary | ICD-10-CM | POA: Diagnosis not present

## 2017-05-26 DIAGNOSIS — D631 Anemia in chronic kidney disease: Secondary | ICD-10-CM | POA: Diagnosis not present

## 2017-05-26 DIAGNOSIS — N2581 Secondary hyperparathyroidism of renal origin: Secondary | ICD-10-CM | POA: Diagnosis not present

## 2017-05-26 DIAGNOSIS — N186 End stage renal disease: Secondary | ICD-10-CM | POA: Diagnosis not present

## 2017-05-26 DIAGNOSIS — Z992 Dependence on renal dialysis: Secondary | ICD-10-CM | POA: Diagnosis not present

## 2017-05-27 DIAGNOSIS — D509 Iron deficiency anemia, unspecified: Secondary | ICD-10-CM | POA: Diagnosis not present

## 2017-05-27 DIAGNOSIS — N2581 Secondary hyperparathyroidism of renal origin: Secondary | ICD-10-CM | POA: Diagnosis not present

## 2017-05-27 DIAGNOSIS — N186 End stage renal disease: Secondary | ICD-10-CM | POA: Diagnosis not present

## 2017-05-27 DIAGNOSIS — D631 Anemia in chronic kidney disease: Secondary | ICD-10-CM | POA: Diagnosis not present

## 2017-05-27 DIAGNOSIS — E440 Moderate protein-calorie malnutrition: Secondary | ICD-10-CM | POA: Diagnosis not present

## 2017-05-27 DIAGNOSIS — N2589 Other disorders resulting from impaired renal tubular function: Secondary | ICD-10-CM | POA: Diagnosis not present

## 2017-05-28 DIAGNOSIS — D509 Iron deficiency anemia, unspecified: Secondary | ICD-10-CM | POA: Diagnosis not present

## 2017-05-28 DIAGNOSIS — D631 Anemia in chronic kidney disease: Secondary | ICD-10-CM | POA: Diagnosis not present

## 2017-05-28 DIAGNOSIS — E440 Moderate protein-calorie malnutrition: Secondary | ICD-10-CM | POA: Diagnosis not present

## 2017-05-28 DIAGNOSIS — N2589 Other disorders resulting from impaired renal tubular function: Secondary | ICD-10-CM | POA: Diagnosis not present

## 2017-05-28 DIAGNOSIS — N2581 Secondary hyperparathyroidism of renal origin: Secondary | ICD-10-CM | POA: Diagnosis not present

## 2017-05-28 DIAGNOSIS — N186 End stage renal disease: Secondary | ICD-10-CM | POA: Diagnosis not present

## 2017-05-29 DIAGNOSIS — D631 Anemia in chronic kidney disease: Secondary | ICD-10-CM | POA: Diagnosis not present

## 2017-05-29 DIAGNOSIS — N186 End stage renal disease: Secondary | ICD-10-CM | POA: Diagnosis not present

## 2017-05-29 DIAGNOSIS — N2589 Other disorders resulting from impaired renal tubular function: Secondary | ICD-10-CM | POA: Diagnosis not present

## 2017-05-29 DIAGNOSIS — R8299 Other abnormal findings in urine: Secondary | ICD-10-CM | POA: Diagnosis not present

## 2017-05-29 DIAGNOSIS — E440 Moderate protein-calorie malnutrition: Secondary | ICD-10-CM | POA: Diagnosis not present

## 2017-05-29 DIAGNOSIS — E784 Other hyperlipidemia: Secondary | ICD-10-CM | POA: Diagnosis not present

## 2017-05-29 DIAGNOSIS — N2581 Secondary hyperparathyroidism of renal origin: Secondary | ICD-10-CM | POA: Diagnosis not present

## 2017-05-29 DIAGNOSIS — E1129 Type 2 diabetes mellitus with other diabetic kidney complication: Secondary | ICD-10-CM | POA: Diagnosis not present

## 2017-05-29 DIAGNOSIS — D509 Iron deficiency anemia, unspecified: Secondary | ICD-10-CM | POA: Diagnosis not present

## 2017-05-30 DIAGNOSIS — E440 Moderate protein-calorie malnutrition: Secondary | ICD-10-CM | POA: Diagnosis not present

## 2017-05-30 DIAGNOSIS — N186 End stage renal disease: Secondary | ICD-10-CM | POA: Diagnosis not present

## 2017-05-30 DIAGNOSIS — D509 Iron deficiency anemia, unspecified: Secondary | ICD-10-CM | POA: Diagnosis not present

## 2017-05-30 DIAGNOSIS — D631 Anemia in chronic kidney disease: Secondary | ICD-10-CM | POA: Diagnosis not present

## 2017-05-30 DIAGNOSIS — N2589 Other disorders resulting from impaired renal tubular function: Secondary | ICD-10-CM | POA: Diagnosis not present

## 2017-05-30 DIAGNOSIS — N2581 Secondary hyperparathyroidism of renal origin: Secondary | ICD-10-CM | POA: Diagnosis not present

## 2017-05-31 DIAGNOSIS — N186 End stage renal disease: Secondary | ICD-10-CM | POA: Diagnosis not present

## 2017-05-31 DIAGNOSIS — D631 Anemia in chronic kidney disease: Secondary | ICD-10-CM | POA: Diagnosis not present

## 2017-05-31 DIAGNOSIS — N2589 Other disorders resulting from impaired renal tubular function: Secondary | ICD-10-CM | POA: Diagnosis not present

## 2017-05-31 DIAGNOSIS — D509 Iron deficiency anemia, unspecified: Secondary | ICD-10-CM | POA: Diagnosis not present

## 2017-05-31 DIAGNOSIS — E440 Moderate protein-calorie malnutrition: Secondary | ICD-10-CM | POA: Diagnosis not present

## 2017-05-31 DIAGNOSIS — N2581 Secondary hyperparathyroidism of renal origin: Secondary | ICD-10-CM | POA: Diagnosis not present

## 2017-06-01 DIAGNOSIS — N186 End stage renal disease: Secondary | ICD-10-CM | POA: Diagnosis not present

## 2017-06-01 DIAGNOSIS — D509 Iron deficiency anemia, unspecified: Secondary | ICD-10-CM | POA: Diagnosis not present

## 2017-06-01 DIAGNOSIS — D631 Anemia in chronic kidney disease: Secondary | ICD-10-CM | POA: Diagnosis not present

## 2017-06-01 DIAGNOSIS — N2581 Secondary hyperparathyroidism of renal origin: Secondary | ICD-10-CM | POA: Diagnosis not present

## 2017-06-01 DIAGNOSIS — E440 Moderate protein-calorie malnutrition: Secondary | ICD-10-CM | POA: Diagnosis not present

## 2017-06-01 DIAGNOSIS — N2589 Other disorders resulting from impaired renal tubular function: Secondary | ICD-10-CM | POA: Diagnosis not present

## 2017-06-02 DIAGNOSIS — D509 Iron deficiency anemia, unspecified: Secondary | ICD-10-CM | POA: Diagnosis not present

## 2017-06-02 DIAGNOSIS — E440 Moderate protein-calorie malnutrition: Secondary | ICD-10-CM | POA: Diagnosis not present

## 2017-06-02 DIAGNOSIS — N186 End stage renal disease: Secondary | ICD-10-CM | POA: Diagnosis not present

## 2017-06-02 DIAGNOSIS — D631 Anemia in chronic kidney disease: Secondary | ICD-10-CM | POA: Diagnosis not present

## 2017-06-02 DIAGNOSIS — N2589 Other disorders resulting from impaired renal tubular function: Secondary | ICD-10-CM | POA: Diagnosis not present

## 2017-06-02 DIAGNOSIS — N2581 Secondary hyperparathyroidism of renal origin: Secondary | ICD-10-CM | POA: Diagnosis not present

## 2017-06-03 DIAGNOSIS — N186 End stage renal disease: Secondary | ICD-10-CM | POA: Diagnosis not present

## 2017-06-03 DIAGNOSIS — E440 Moderate protein-calorie malnutrition: Secondary | ICD-10-CM | POA: Diagnosis not present

## 2017-06-03 DIAGNOSIS — D631 Anemia in chronic kidney disease: Secondary | ICD-10-CM | POA: Diagnosis not present

## 2017-06-03 DIAGNOSIS — N2581 Secondary hyperparathyroidism of renal origin: Secondary | ICD-10-CM | POA: Diagnosis not present

## 2017-06-03 DIAGNOSIS — N2589 Other disorders resulting from impaired renal tubular function: Secondary | ICD-10-CM | POA: Diagnosis not present

## 2017-06-03 DIAGNOSIS — D509 Iron deficiency anemia, unspecified: Secondary | ICD-10-CM | POA: Diagnosis not present

## 2017-06-04 DIAGNOSIS — N186 End stage renal disease: Secondary | ICD-10-CM | POA: Diagnosis not present

## 2017-06-04 DIAGNOSIS — N2581 Secondary hyperparathyroidism of renal origin: Secondary | ICD-10-CM | POA: Diagnosis not present

## 2017-06-04 DIAGNOSIS — D631 Anemia in chronic kidney disease: Secondary | ICD-10-CM | POA: Diagnosis not present

## 2017-06-04 DIAGNOSIS — D509 Iron deficiency anemia, unspecified: Secondary | ICD-10-CM | POA: Diagnosis not present

## 2017-06-04 DIAGNOSIS — E440 Moderate protein-calorie malnutrition: Secondary | ICD-10-CM | POA: Diagnosis not present

## 2017-06-04 DIAGNOSIS — N2589 Other disorders resulting from impaired renal tubular function: Secondary | ICD-10-CM | POA: Diagnosis not present

## 2017-06-05 ENCOUNTER — Inpatient Hospital Stay: Payer: Medicare Other | Admitting: Physical Medicine & Rehabilitation

## 2017-06-05 DIAGNOSIS — D509 Iron deficiency anemia, unspecified: Secondary | ICD-10-CM | POA: Diagnosis not present

## 2017-06-05 DIAGNOSIS — N2581 Secondary hyperparathyroidism of renal origin: Secondary | ICD-10-CM | POA: Diagnosis not present

## 2017-06-05 DIAGNOSIS — N2589 Other disorders resulting from impaired renal tubular function: Secondary | ICD-10-CM | POA: Diagnosis not present

## 2017-06-05 DIAGNOSIS — E440 Moderate protein-calorie malnutrition: Secondary | ICD-10-CM | POA: Diagnosis not present

## 2017-06-05 DIAGNOSIS — N186 End stage renal disease: Secondary | ICD-10-CM | POA: Diagnosis not present

## 2017-06-05 DIAGNOSIS — D631 Anemia in chronic kidney disease: Secondary | ICD-10-CM | POA: Diagnosis not present

## 2017-06-06 ENCOUNTER — Encounter: Payer: Self-pay | Admitting: Physical Medicine & Rehabilitation

## 2017-06-06 ENCOUNTER — Encounter: Payer: Medicare Other | Attending: Physical Medicine & Rehabilitation | Admitting: Physical Medicine & Rehabilitation

## 2017-06-06 VITALS — BP 163/73 | HR 68

## 2017-06-06 DIAGNOSIS — N186 End stage renal disease: Secondary | ICD-10-CM

## 2017-06-06 DIAGNOSIS — Z87891 Personal history of nicotine dependence: Secondary | ICD-10-CM | POA: Insufficient documentation

## 2017-06-06 DIAGNOSIS — D631 Anemia in chronic kidney disease: Secondary | ICD-10-CM | POA: Diagnosis not present

## 2017-06-06 DIAGNOSIS — R509 Fever, unspecified: Secondary | ICD-10-CM | POA: Diagnosis not present

## 2017-06-06 DIAGNOSIS — Z8673 Personal history of transient ischemic attack (TIA), and cerebral infarction without residual deficits: Secondary | ICD-10-CM | POA: Insufficient documentation

## 2017-06-06 DIAGNOSIS — E1142 Type 2 diabetes mellitus with diabetic polyneuropathy: Secondary | ICD-10-CM | POA: Insufficient documentation

## 2017-06-06 DIAGNOSIS — G40909 Epilepsy, unspecified, not intractable, without status epilepticus: Secondary | ICD-10-CM | POA: Insufficient documentation

## 2017-06-06 DIAGNOSIS — E1122 Type 2 diabetes mellitus with diabetic chronic kidney disease: Secondary | ICD-10-CM | POA: Diagnosis not present

## 2017-06-06 DIAGNOSIS — Z992 Dependence on renal dialysis: Secondary | ICD-10-CM | POA: Diagnosis not present

## 2017-06-06 DIAGNOSIS — E440 Moderate protein-calorie malnutrition: Secondary | ICD-10-CM | POA: Diagnosis not present

## 2017-06-06 DIAGNOSIS — Z8546 Personal history of malignant neoplasm of prostate: Secondary | ICD-10-CM | POA: Insufficient documentation

## 2017-06-06 DIAGNOSIS — E213 Hyperparathyroidism, unspecified: Secondary | ICD-10-CM | POA: Insufficient documentation

## 2017-06-06 DIAGNOSIS — I120 Hypertensive chronic kidney disease with stage 5 chronic kidney disease or end stage renal disease: Secondary | ICD-10-CM | POA: Insufficient documentation

## 2017-06-06 DIAGNOSIS — G9340 Encephalopathy, unspecified: Secondary | ICD-10-CM | POA: Diagnosis not present

## 2017-06-06 DIAGNOSIS — R4781 Slurred speech: Secondary | ICD-10-CM | POA: Insufficient documentation

## 2017-06-06 DIAGNOSIS — G9341 Metabolic encephalopathy: Secondary | ICD-10-CM | POA: Diagnosis not present

## 2017-06-06 DIAGNOSIS — N2589 Other disorders resulting from impaired renal tubular function: Secondary | ICD-10-CM | POA: Diagnosis not present

## 2017-06-06 DIAGNOSIS — N2581 Secondary hyperparathyroidism of renal origin: Secondary | ICD-10-CM | POA: Diagnosis not present

## 2017-06-06 DIAGNOSIS — D509 Iron deficiency anemia, unspecified: Secondary | ICD-10-CM | POA: Diagnosis not present

## 2017-06-06 NOTE — Progress Notes (Signed)
Subjective:    Patient ID: Jared [name], male    DOB: 02/26/1956, 61 y.o.   MRN: [id number]  HPI   Jared [name] is here in follow up of his rehab stay. He was discharged to home and has made nice progress. His sugars are under much better control--he is using lantus 30u daily. Sugars are in the 100-130 range.  His bp is under improved control. He sees [facility] for his HD.   He is sleeping well. No further seizures. He has completed therapy. He denies any problems with balance or strength.   Pain Inventory Average Pain . Pain Right Now . My pain is aching  In the last 24 hours, has pain interfered with the following? General activity 1 Relation with others 0 Enjoyment of life 0 What TIME of day is your pain at its worst? morning Sleep (in general) .  Pain is worse with: . Pain improves with: . Relief from Meds: .  Mobility walk without assistance ability to climb steps?  yes do you drive?  no  Function employed # of hrs/week .  Neuro/Psych No problems in this area  Prior Studies Any changes since last visit?  no  Physicians involved in your care Any changes since last visit?  no   Family History  Problem Relation Age of Onset  . Cancer - Lung Mother   . Cancer - Other Father   . Diabetes Neg Hx    Social History   Social History  . Marital status: Divorced    Spouse name: N/A  . Number of children: N/A  . Years of education: N/A   Social History Main Topics  . Smoking status: Former Smoker    Quit date: 05/11/1984  . Smokeless tobacco: Never Used  . Alcohol use 0.6 oz/week    1 Glasses of wine per week     Comment: occasional  . Drug use: No  . Sexual activity: Not Asked   Other Topics Concern  . None   Social History Narrative  . None   Past Surgical History:  Procedure Laterality Date  . AV FISTULA PLACEMENT Left 06/04/2014   Procedure: ARTERIOVENOUS (AV) FISTULA CREATION-  BRACHIOCEPHALIC WITH LIGATION OF COMPETEING BRANCH;   Surgeon: Mal Misty, MD;  Location: [place];  Service: Vascular;  Laterality: Left;  . INSERTION OF DIALYSIS CATHETER  04/2014  . IR GENERIC HISTORICAL Left 11/30/2016   IR THROMBECTOMY AV FISTULA W/THROMBOLYSIS/PTA/STENT INC/SHUNT/IMG LT 11/30/2016 Markus Daft, MD [facility]  . IR GENERIC HISTORICAL  11/30/2016   IR US GUIDE VASC ACCESS LEFT 11/30/2016 Markus Daft, MD [facility]   Past Medical History:  Diagnosis Date  . Anemia   . Chronic kidney disease    on dialysis M-W-F  . Diabetes mellitus without complication ([place])    onset as adult  . Hypertension   . Seizures ([place])   . Stroke ([place])   . Thyroid disease    hyperparathyroidism   BP (!) 163/73   Pulse 68   SpO2 97%   Opioid Risk Score:   Fall Risk Score:  `1  Depression screen PHQ 2/9  Depression screen PHQ 2/9 06/06/2017  Decreased Interest 1  Down, Depressed, Hopeless 0  PHQ - 2 Score 1  Altered sleeping 2  Tired, decreased energy 0  Change in appetite 2  Feeling bad or failure about yourself  0  Trouble concentrating 0  Moving slowly or fidgety/restless 0  Suicidal thoughts 0  PHQ-9 Score 5  Difficult doing work/chores Somewhat difficult     Review of Systems  Constitutional: Positive for appetite change, diaphoresis and unexpected weight change.  HENT: Negative.   Eyes: Negative.   Respiratory: Negative.   Cardiovascular: Negative.   Gastrointestinal: Positive for constipation.  Endocrine: Negative.   Genitourinary: Negative.   Musculoskeletal: Negative.   Skin: Negative.   Allergic/Immunologic: Negative.   Neurological: Negative.   Hematological: Negative.   Psychiatric/Behavioral: Negative.   All other systems reviewed and are negative.      Objective:   Physical Exam  Constitutional: He appears well-developed and well-nourished.  HENT:  Head: Normocephalic and atraumatic.  Eyes: PERRL Neck: Normal range of motion. Neck supple. No thyromegaly present.  Cardiovascular: RRR Respiratory:  CTA B GI: Soft.  Musculoskeletal: He exhibits tr  edema right forearm .  Neurological: He is alert.    A&Ox3 Followed simple commands. Normal insight and awareness Motor: B/l UE 5/5 proximal to distal B/l LE: 5/5 proximal to distal. Tandem gait normal. Skin: Skin is warm and dry.  Psychiatric: His affect is appropriate/pleasant      Assessment & Plan:  1. Decreased functional mobility, slurred speech secondary to metabolic encephalopathy/fever/ hyperglycemia             -continue HEP  -I have nothing more to add. He has recovered from his cognitive setbacks 2. ESRD. Hemodialysis as per renal services 9. Hypertension. Improved control overall---continue per nephrology 4. Seizure disorder. Keppra 250 mg twice a day---continue for now but should be able to discontinue over the next few months as seizures were likely relatd to his extreme hyperglycemia. . 12. Diabetes mellitus and peripheral neuropathy.  -improved control ---30 units before breakfast              13. History of prostate cancer. PSA per urlogy Follow up with me prn

## 2017-06-06 NOTE — Patient Instructions (Signed)
PLEASE FEEL FREE TO CALL OUR OFFICE WITH ANY PROBLEMS OR QUESTIONS ([phone])      

## 2017-06-07 DIAGNOSIS — E440 Moderate protein-calorie malnutrition: Secondary | ICD-10-CM | POA: Diagnosis not present

## 2017-06-07 DIAGNOSIS — D509 Iron deficiency anemia, unspecified: Secondary | ICD-10-CM | POA: Diagnosis not present

## 2017-06-07 DIAGNOSIS — N2589 Other disorders resulting from impaired renal tubular function: Secondary | ICD-10-CM | POA: Diagnosis not present

## 2017-06-07 DIAGNOSIS — N2581 Secondary hyperparathyroidism of renal origin: Secondary | ICD-10-CM | POA: Diagnosis not present

## 2017-06-07 DIAGNOSIS — N186 End stage renal disease: Secondary | ICD-10-CM | POA: Diagnosis not present

## 2017-06-07 DIAGNOSIS — D631 Anemia in chronic kidney disease: Secondary | ICD-10-CM | POA: Diagnosis not present

## 2017-06-08 DIAGNOSIS — N186 End stage renal disease: Secondary | ICD-10-CM | POA: Diagnosis not present

## 2017-06-08 DIAGNOSIS — N2581 Secondary hyperparathyroidism of renal origin: Secondary | ICD-10-CM | POA: Diagnosis not present

## 2017-06-08 DIAGNOSIS — D631 Anemia in chronic kidney disease: Secondary | ICD-10-CM | POA: Diagnosis not present

## 2017-06-08 DIAGNOSIS — D509 Iron deficiency anemia, unspecified: Secondary | ICD-10-CM | POA: Diagnosis not present

## 2017-06-08 DIAGNOSIS — E440 Moderate protein-calorie malnutrition: Secondary | ICD-10-CM | POA: Diagnosis not present

## 2017-06-08 DIAGNOSIS — N2589 Other disorders resulting from impaired renal tubular function: Secondary | ICD-10-CM | POA: Diagnosis not present

## 2017-06-09 DIAGNOSIS — E440 Moderate protein-calorie malnutrition: Secondary | ICD-10-CM | POA: Diagnosis not present

## 2017-06-09 DIAGNOSIS — N2581 Secondary hyperparathyroidism of renal origin: Secondary | ICD-10-CM | POA: Diagnosis not present

## 2017-06-09 DIAGNOSIS — D631 Anemia in chronic kidney disease: Secondary | ICD-10-CM | POA: Diagnosis not present

## 2017-06-09 DIAGNOSIS — N2589 Other disorders resulting from impaired renal tubular function: Secondary | ICD-10-CM | POA: Diagnosis not present

## 2017-06-09 DIAGNOSIS — D509 Iron deficiency anemia, unspecified: Secondary | ICD-10-CM | POA: Diagnosis not present

## 2017-06-09 DIAGNOSIS — N186 End stage renal disease: Secondary | ICD-10-CM | POA: Diagnosis not present

## 2017-06-10 DIAGNOSIS — N2581 Secondary hyperparathyroidism of renal origin: Secondary | ICD-10-CM | POA: Diagnosis not present

## 2017-06-10 DIAGNOSIS — N186 End stage renal disease: Secondary | ICD-10-CM | POA: Diagnosis not present

## 2017-06-10 DIAGNOSIS — D509 Iron deficiency anemia, unspecified: Secondary | ICD-10-CM | POA: Diagnosis not present

## 2017-06-10 DIAGNOSIS — E440 Moderate protein-calorie malnutrition: Secondary | ICD-10-CM | POA: Diagnosis not present

## 2017-06-10 DIAGNOSIS — D631 Anemia in chronic kidney disease: Secondary | ICD-10-CM | POA: Diagnosis not present

## 2017-06-10 DIAGNOSIS — N2589 Other disorders resulting from impaired renal tubular function: Secondary | ICD-10-CM | POA: Diagnosis not present

## 2017-06-11 DIAGNOSIS — D509 Iron deficiency anemia, unspecified: Secondary | ICD-10-CM | POA: Diagnosis not present

## 2017-06-11 DIAGNOSIS — E440 Moderate protein-calorie malnutrition: Secondary | ICD-10-CM | POA: Diagnosis not present

## 2017-06-11 DIAGNOSIS — N2581 Secondary hyperparathyroidism of renal origin: Secondary | ICD-10-CM | POA: Diagnosis not present

## 2017-06-11 DIAGNOSIS — D631 Anemia in chronic kidney disease: Secondary | ICD-10-CM | POA: Diagnosis not present

## 2017-06-11 DIAGNOSIS — N2589 Other disorders resulting from impaired renal tubular function: Secondary | ICD-10-CM | POA: Diagnosis not present

## 2017-06-11 DIAGNOSIS — N186 End stage renal disease: Secondary | ICD-10-CM | POA: Diagnosis not present

## 2017-06-12 DIAGNOSIS — D631 Anemia in chronic kidney disease: Secondary | ICD-10-CM | POA: Diagnosis not present

## 2017-06-12 DIAGNOSIS — N2589 Other disorders resulting from impaired renal tubular function: Secondary | ICD-10-CM | POA: Diagnosis not present

## 2017-06-12 DIAGNOSIS — D509 Iron deficiency anemia, unspecified: Secondary | ICD-10-CM | POA: Diagnosis not present

## 2017-06-12 DIAGNOSIS — E440 Moderate protein-calorie malnutrition: Secondary | ICD-10-CM | POA: Diagnosis not present

## 2017-06-12 DIAGNOSIS — N186 End stage renal disease: Secondary | ICD-10-CM | POA: Diagnosis not present

## 2017-06-12 DIAGNOSIS — N2581 Secondary hyperparathyroidism of renal origin: Secondary | ICD-10-CM | POA: Diagnosis not present

## 2017-06-13 DIAGNOSIS — E440 Moderate protein-calorie malnutrition: Secondary | ICD-10-CM | POA: Diagnosis not present

## 2017-06-13 DIAGNOSIS — D631 Anemia in chronic kidney disease: Secondary | ICD-10-CM | POA: Diagnosis not present

## 2017-06-13 DIAGNOSIS — N2589 Other disorders resulting from impaired renal tubular function: Secondary | ICD-10-CM | POA: Diagnosis not present

## 2017-06-13 DIAGNOSIS — D509 Iron deficiency anemia, unspecified: Secondary | ICD-10-CM | POA: Diagnosis not present

## 2017-06-13 DIAGNOSIS — N186 End stage renal disease: Secondary | ICD-10-CM | POA: Diagnosis not present

## 2017-06-13 DIAGNOSIS — N2581 Secondary hyperparathyroidism of renal origin: Secondary | ICD-10-CM | POA: Diagnosis not present

## 2017-06-14 DIAGNOSIS — D509 Iron deficiency anemia, unspecified: Secondary | ICD-10-CM | POA: Diagnosis not present

## 2017-06-14 DIAGNOSIS — N2581 Secondary hyperparathyroidism of renal origin: Secondary | ICD-10-CM | POA: Diagnosis not present

## 2017-06-14 DIAGNOSIS — D631 Anemia in chronic kidney disease: Secondary | ICD-10-CM | POA: Diagnosis not present

## 2017-06-14 DIAGNOSIS — N186 End stage renal disease: Secondary | ICD-10-CM | POA: Diagnosis not present

## 2017-06-14 DIAGNOSIS — E440 Moderate protein-calorie malnutrition: Secondary | ICD-10-CM | POA: Diagnosis not present

## 2017-06-14 DIAGNOSIS — N2589 Other disorders resulting from impaired renal tubular function: Secondary | ICD-10-CM | POA: Diagnosis not present

## 2017-06-15 DIAGNOSIS — D509 Iron deficiency anemia, unspecified: Secondary | ICD-10-CM | POA: Diagnosis not present

## 2017-06-15 DIAGNOSIS — N186 End stage renal disease: Secondary | ICD-10-CM | POA: Diagnosis not present

## 2017-06-15 DIAGNOSIS — N2581 Secondary hyperparathyroidism of renal origin: Secondary | ICD-10-CM | POA: Diagnosis not present

## 2017-06-15 DIAGNOSIS — N2589 Other disorders resulting from impaired renal tubular function: Secondary | ICD-10-CM | POA: Diagnosis not present

## 2017-06-15 DIAGNOSIS — D631 Anemia in chronic kidney disease: Secondary | ICD-10-CM | POA: Diagnosis not present

## 2017-06-15 DIAGNOSIS — E440 Moderate protein-calorie malnutrition: Secondary | ICD-10-CM | POA: Diagnosis not present

## 2017-06-16 DIAGNOSIS — N2589 Other disorders resulting from impaired renal tubular function: Secondary | ICD-10-CM | POA: Diagnosis not present

## 2017-06-16 DIAGNOSIS — N2581 Secondary hyperparathyroidism of renal origin: Secondary | ICD-10-CM | POA: Diagnosis not present

## 2017-06-16 DIAGNOSIS — D509 Iron deficiency anemia, unspecified: Secondary | ICD-10-CM | POA: Diagnosis not present

## 2017-06-16 DIAGNOSIS — D631 Anemia in chronic kidney disease: Secondary | ICD-10-CM | POA: Diagnosis not present

## 2017-06-16 DIAGNOSIS — N186 End stage renal disease: Secondary | ICD-10-CM | POA: Diagnosis not present

## 2017-06-16 DIAGNOSIS — E440 Moderate protein-calorie malnutrition: Secondary | ICD-10-CM | POA: Diagnosis not present

## 2017-06-17 DIAGNOSIS — D509 Iron deficiency anemia, unspecified: Secondary | ICD-10-CM | POA: Diagnosis not present

## 2017-06-17 DIAGNOSIS — N2589 Other disorders resulting from impaired renal tubular function: Secondary | ICD-10-CM | POA: Diagnosis not present

## 2017-06-17 DIAGNOSIS — D631 Anemia in chronic kidney disease: Secondary | ICD-10-CM | POA: Diagnosis not present

## 2017-06-17 DIAGNOSIS — E440 Moderate protein-calorie malnutrition: Secondary | ICD-10-CM | POA: Diagnosis not present

## 2017-06-17 DIAGNOSIS — N186 End stage renal disease: Secondary | ICD-10-CM | POA: Diagnosis not present

## 2017-06-17 DIAGNOSIS — N2581 Secondary hyperparathyroidism of renal origin: Secondary | ICD-10-CM | POA: Diagnosis not present

## 2017-06-18 DIAGNOSIS — E440 Moderate protein-calorie malnutrition: Secondary | ICD-10-CM | POA: Diagnosis not present

## 2017-06-18 DIAGNOSIS — D509 Iron deficiency anemia, unspecified: Secondary | ICD-10-CM | POA: Diagnosis not present

## 2017-06-18 DIAGNOSIS — N186 End stage renal disease: Secondary | ICD-10-CM | POA: Diagnosis not present

## 2017-06-18 DIAGNOSIS — N2581 Secondary hyperparathyroidism of renal origin: Secondary | ICD-10-CM | POA: Diagnosis not present

## 2017-06-18 DIAGNOSIS — N2589 Other disorders resulting from impaired renal tubular function: Secondary | ICD-10-CM | POA: Diagnosis not present

## 2017-06-18 DIAGNOSIS — D631 Anemia in chronic kidney disease: Secondary | ICD-10-CM | POA: Diagnosis not present

## 2017-06-19 DIAGNOSIS — E440 Moderate protein-calorie malnutrition: Secondary | ICD-10-CM | POA: Diagnosis not present

## 2017-06-19 DIAGNOSIS — N2581 Secondary hyperparathyroidism of renal origin: Secondary | ICD-10-CM | POA: Diagnosis not present

## 2017-06-19 DIAGNOSIS — D509 Iron deficiency anemia, unspecified: Secondary | ICD-10-CM | POA: Diagnosis not present

## 2017-06-19 DIAGNOSIS — N2589 Other disorders resulting from impaired renal tubular function: Secondary | ICD-10-CM | POA: Diagnosis not present

## 2017-06-19 DIAGNOSIS — N186 End stage renal disease: Secondary | ICD-10-CM | POA: Diagnosis not present

## 2017-06-19 DIAGNOSIS — D631 Anemia in chronic kidney disease: Secondary | ICD-10-CM | POA: Diagnosis not present

## 2017-06-20 DIAGNOSIS — D509 Iron deficiency anemia, unspecified: Secondary | ICD-10-CM | POA: Diagnosis not present

## 2017-06-20 DIAGNOSIS — N2581 Secondary hyperparathyroidism of renal origin: Secondary | ICD-10-CM | POA: Diagnosis not present

## 2017-06-20 DIAGNOSIS — E440 Moderate protein-calorie malnutrition: Secondary | ICD-10-CM | POA: Diagnosis not present

## 2017-06-20 DIAGNOSIS — D631 Anemia in chronic kidney disease: Secondary | ICD-10-CM | POA: Diagnosis not present

## 2017-06-20 DIAGNOSIS — N2589 Other disorders resulting from impaired renal tubular function: Secondary | ICD-10-CM | POA: Diagnosis not present

## 2017-06-20 DIAGNOSIS — N186 End stage renal disease: Secondary | ICD-10-CM | POA: Diagnosis not present

## 2017-06-21 DIAGNOSIS — N2581 Secondary hyperparathyroidism of renal origin: Secondary | ICD-10-CM | POA: Diagnosis not present

## 2017-06-21 DIAGNOSIS — D631 Anemia in chronic kidney disease: Secondary | ICD-10-CM | POA: Diagnosis not present

## 2017-06-21 DIAGNOSIS — D509 Iron deficiency anemia, unspecified: Secondary | ICD-10-CM | POA: Diagnosis not present

## 2017-06-21 DIAGNOSIS — E440 Moderate protein-calorie malnutrition: Secondary | ICD-10-CM | POA: Diagnosis not present

## 2017-06-21 DIAGNOSIS — N186 End stage renal disease: Secondary | ICD-10-CM | POA: Diagnosis not present

## 2017-06-21 DIAGNOSIS — N2589 Other disorders resulting from impaired renal tubular function: Secondary | ICD-10-CM | POA: Diagnosis not present

## 2017-06-22 DIAGNOSIS — N2589 Other disorders resulting from impaired renal tubular function: Secondary | ICD-10-CM | POA: Diagnosis not present

## 2017-06-22 DIAGNOSIS — N2581 Secondary hyperparathyroidism of renal origin: Secondary | ICD-10-CM | POA: Diagnosis not present

## 2017-06-22 DIAGNOSIS — D509 Iron deficiency anemia, unspecified: Secondary | ICD-10-CM | POA: Diagnosis not present

## 2017-06-22 DIAGNOSIS — E440 Moderate protein-calorie malnutrition: Secondary | ICD-10-CM | POA: Diagnosis not present

## 2017-06-22 DIAGNOSIS — D631 Anemia in chronic kidney disease: Secondary | ICD-10-CM | POA: Diagnosis not present

## 2017-06-22 DIAGNOSIS — N186 End stage renal disease: Secondary | ICD-10-CM | POA: Diagnosis not present

## 2017-06-23 DIAGNOSIS — E440 Moderate protein-calorie malnutrition: Secondary | ICD-10-CM | POA: Diagnosis not present

## 2017-06-23 DIAGNOSIS — D631 Anemia in chronic kidney disease: Secondary | ICD-10-CM | POA: Diagnosis not present

## 2017-06-23 DIAGNOSIS — N2589 Other disorders resulting from impaired renal tubular function: Secondary | ICD-10-CM | POA: Diagnosis not present

## 2017-06-23 DIAGNOSIS — N2581 Secondary hyperparathyroidism of renal origin: Secondary | ICD-10-CM | POA: Diagnosis not present

## 2017-06-23 DIAGNOSIS — N186 End stage renal disease: Secondary | ICD-10-CM | POA: Diagnosis not present

## 2017-06-23 DIAGNOSIS — D509 Iron deficiency anemia, unspecified: Secondary | ICD-10-CM | POA: Diagnosis not present

## 2017-06-24 DIAGNOSIS — D631 Anemia in chronic kidney disease: Secondary | ICD-10-CM | POA: Diagnosis not present

## 2017-06-24 DIAGNOSIS — N2589 Other disorders resulting from impaired renal tubular function: Secondary | ICD-10-CM | POA: Diagnosis not present

## 2017-06-24 DIAGNOSIS — N2581 Secondary hyperparathyroidism of renal origin: Secondary | ICD-10-CM | POA: Diagnosis not present

## 2017-06-24 DIAGNOSIS — E440 Moderate protein-calorie malnutrition: Secondary | ICD-10-CM | POA: Diagnosis not present

## 2017-06-24 DIAGNOSIS — D509 Iron deficiency anemia, unspecified: Secondary | ICD-10-CM | POA: Diagnosis not present

## 2017-06-24 DIAGNOSIS — N186 End stage renal disease: Secondary | ICD-10-CM | POA: Diagnosis not present

## 2017-06-25 DIAGNOSIS — D631 Anemia in chronic kidney disease: Secondary | ICD-10-CM | POA: Diagnosis not present

## 2017-06-25 DIAGNOSIS — N186 End stage renal disease: Secondary | ICD-10-CM | POA: Diagnosis not present

## 2017-06-25 DIAGNOSIS — E440 Moderate protein-calorie malnutrition: Secondary | ICD-10-CM | POA: Diagnosis not present

## 2017-06-25 DIAGNOSIS — N2581 Secondary hyperparathyroidism of renal origin: Secondary | ICD-10-CM | POA: Diagnosis not present

## 2017-06-25 DIAGNOSIS — N2589 Other disorders resulting from impaired renal tubular function: Secondary | ICD-10-CM | POA: Diagnosis not present

## 2017-06-25 DIAGNOSIS — D509 Iron deficiency anemia, unspecified: Secondary | ICD-10-CM | POA: Diagnosis not present

## 2017-06-26 ENCOUNTER — Other Ambulatory Visit: Payer: Self-pay | Admitting: Physical Medicine & Rehabilitation

## 2017-06-26 DIAGNOSIS — N2581 Secondary hyperparathyroidism of renal origin: Secondary | ICD-10-CM | POA: Diagnosis not present

## 2017-06-26 DIAGNOSIS — D631 Anemia in chronic kidney disease: Secondary | ICD-10-CM | POA: Diagnosis not present

## 2017-06-26 DIAGNOSIS — I129 Hypertensive chronic kidney disease with stage 1 through stage 4 chronic kidney disease, or unspecified chronic kidney disease: Secondary | ICD-10-CM | POA: Diagnosis not present

## 2017-06-26 DIAGNOSIS — N2589 Other disorders resulting from impaired renal tubular function: Secondary | ICD-10-CM | POA: Diagnosis not present

## 2017-06-26 DIAGNOSIS — N186 End stage renal disease: Secondary | ICD-10-CM | POA: Diagnosis not present

## 2017-06-26 DIAGNOSIS — D509 Iron deficiency anemia, unspecified: Secondary | ICD-10-CM | POA: Diagnosis not present

## 2017-06-26 DIAGNOSIS — Z992 Dependence on renal dialysis: Secondary | ICD-10-CM | POA: Diagnosis not present

## 2017-06-26 DIAGNOSIS — E440 Moderate protein-calorie malnutrition: Secondary | ICD-10-CM | POA: Diagnosis not present

## 2017-06-26 MED ORDER — LEVETIRACETAM 250 MG PO TABS: 250.0000 mg | ORAL_TABLET | Freq: Two times a day (BID) | ORAL | 1 refills | Status: DC

## 2017-06-26 NOTE — Telephone Encounter (Signed)
Patient is needing a refill on his Keppra, patient is out of it.

## 2017-06-26 NOTE — Telephone Encounter (Signed)
Jared [name] called asking if we can fill Jared [name]. Dr. Naaman Plummer note reviewed. [name] refill. [facility] is aware and will relay the message to Jared [name].

## 2017-06-27 DIAGNOSIS — N186 End stage renal disease: Secondary | ICD-10-CM | POA: Diagnosis not present

## 2017-06-27 DIAGNOSIS — E440 Moderate protein-calorie malnutrition: Secondary | ICD-10-CM | POA: Diagnosis not present

## 2017-06-27 DIAGNOSIS — N2581 Secondary hyperparathyroidism of renal origin: Secondary | ICD-10-CM | POA: Diagnosis not present

## 2017-06-27 DIAGNOSIS — D631 Anemia in chronic kidney disease: Secondary | ICD-10-CM | POA: Diagnosis not present

## 2017-06-27 DIAGNOSIS — R8299 Other abnormal findings in urine: Secondary | ICD-10-CM | POA: Diagnosis not present

## 2017-06-27 DIAGNOSIS — Z5189 Encounter for other specified aftercare: Secondary | ICD-10-CM | POA: Diagnosis not present

## 2017-06-27 DIAGNOSIS — Z79899 Other long term (current) drug therapy: Secondary | ICD-10-CM | POA: Diagnosis not present

## 2017-06-27 DIAGNOSIS — D509 Iron deficiency anemia, unspecified: Secondary | ICD-10-CM | POA: Diagnosis not present

## 2017-06-28 DIAGNOSIS — Z79899 Other long term (current) drug therapy: Secondary | ICD-10-CM | POA: Diagnosis not present

## 2017-06-28 DIAGNOSIS — D509 Iron deficiency anemia, unspecified: Secondary | ICD-10-CM | POA: Diagnosis not present

## 2017-06-28 DIAGNOSIS — E440 Moderate protein-calorie malnutrition: Secondary | ICD-10-CM | POA: Diagnosis not present

## 2017-06-28 DIAGNOSIS — N186 End stage renal disease: Secondary | ICD-10-CM | POA: Diagnosis not present

## 2017-06-28 DIAGNOSIS — N2581 Secondary hyperparathyroidism of renal origin: Secondary | ICD-10-CM | POA: Diagnosis not present

## 2017-06-28 DIAGNOSIS — D631 Anemia in chronic kidney disease: Secondary | ICD-10-CM | POA: Diagnosis not present

## 2017-06-29 DIAGNOSIS — D509 Iron deficiency anemia, unspecified: Secondary | ICD-10-CM | POA: Diagnosis not present

## 2017-06-29 DIAGNOSIS — N2581 Secondary hyperparathyroidism of renal origin: Secondary | ICD-10-CM | POA: Diagnosis not present

## 2017-06-29 DIAGNOSIS — E440 Moderate protein-calorie malnutrition: Secondary | ICD-10-CM | POA: Diagnosis not present

## 2017-06-29 DIAGNOSIS — N186 End stage renal disease: Secondary | ICD-10-CM | POA: Diagnosis not present

## 2017-06-29 DIAGNOSIS — Z79899 Other long term (current) drug therapy: Secondary | ICD-10-CM | POA: Diagnosis not present

## 2017-06-29 DIAGNOSIS — D631 Anemia in chronic kidney disease: Secondary | ICD-10-CM | POA: Diagnosis not present

## 2017-06-30 DIAGNOSIS — N2581 Secondary hyperparathyroidism of renal origin: Secondary | ICD-10-CM | POA: Diagnosis not present

## 2017-06-30 DIAGNOSIS — E440 Moderate protein-calorie malnutrition: Secondary | ICD-10-CM | POA: Diagnosis not present

## 2017-06-30 DIAGNOSIS — N186 End stage renal disease: Secondary | ICD-10-CM | POA: Diagnosis not present

## 2017-06-30 DIAGNOSIS — Z79899 Other long term (current) drug therapy: Secondary | ICD-10-CM | POA: Diagnosis not present

## 2017-06-30 DIAGNOSIS — D509 Iron deficiency anemia, unspecified: Secondary | ICD-10-CM | POA: Diagnosis not present

## 2017-06-30 DIAGNOSIS — D631 Anemia in chronic kidney disease: Secondary | ICD-10-CM | POA: Diagnosis not present

## 2017-07-01 DIAGNOSIS — E440 Moderate protein-calorie malnutrition: Secondary | ICD-10-CM | POA: Diagnosis not present

## 2017-07-01 DIAGNOSIS — D509 Iron deficiency anemia, unspecified: Secondary | ICD-10-CM | POA: Diagnosis not present

## 2017-07-01 DIAGNOSIS — D631 Anemia in chronic kidney disease: Secondary | ICD-10-CM | POA: Diagnosis not present

## 2017-07-01 DIAGNOSIS — Z79899 Other long term (current) drug therapy: Secondary | ICD-10-CM | POA: Diagnosis not present

## 2017-07-01 DIAGNOSIS — N186 End stage renal disease: Secondary | ICD-10-CM | POA: Diagnosis not present

## 2017-07-01 DIAGNOSIS — N2581 Secondary hyperparathyroidism of renal origin: Secondary | ICD-10-CM | POA: Diagnosis not present

## 2017-07-02 DIAGNOSIS — E440 Moderate protein-calorie malnutrition: Secondary | ICD-10-CM | POA: Diagnosis not present

## 2017-07-02 DIAGNOSIS — D509 Iron deficiency anemia, unspecified: Secondary | ICD-10-CM | POA: Diagnosis not present

## 2017-07-02 DIAGNOSIS — D631 Anemia in chronic kidney disease: Secondary | ICD-10-CM | POA: Diagnosis not present

## 2017-07-02 DIAGNOSIS — N2581 Secondary hyperparathyroidism of renal origin: Secondary | ICD-10-CM | POA: Diagnosis not present

## 2017-07-02 DIAGNOSIS — N186 End stage renal disease: Secondary | ICD-10-CM | POA: Diagnosis not present

## 2017-07-02 DIAGNOSIS — Z79899 Other long term (current) drug therapy: Secondary | ICD-10-CM | POA: Diagnosis not present

## 2017-07-03 DIAGNOSIS — D631 Anemia in chronic kidney disease: Secondary | ICD-10-CM | POA: Diagnosis not present

## 2017-07-03 DIAGNOSIS — Z79899 Other long term (current) drug therapy: Secondary | ICD-10-CM | POA: Diagnosis not present

## 2017-07-03 DIAGNOSIS — N186 End stage renal disease: Secondary | ICD-10-CM | POA: Diagnosis not present

## 2017-07-03 DIAGNOSIS — E440 Moderate protein-calorie malnutrition: Secondary | ICD-10-CM | POA: Diagnosis not present

## 2017-07-03 DIAGNOSIS — D509 Iron deficiency anemia, unspecified: Secondary | ICD-10-CM | POA: Diagnosis not present

## 2017-07-03 DIAGNOSIS — N2581 Secondary hyperparathyroidism of renal origin: Secondary | ICD-10-CM | POA: Diagnosis not present

## 2017-07-04 DIAGNOSIS — E440 Moderate protein-calorie malnutrition: Secondary | ICD-10-CM | POA: Diagnosis not present

## 2017-07-04 DIAGNOSIS — D509 Iron deficiency anemia, unspecified: Secondary | ICD-10-CM | POA: Diagnosis not present

## 2017-07-04 DIAGNOSIS — N2581 Secondary hyperparathyroidism of renal origin: Secondary | ICD-10-CM | POA: Diagnosis not present

## 2017-07-04 DIAGNOSIS — N186 End stage renal disease: Secondary | ICD-10-CM | POA: Diagnosis not present

## 2017-07-04 DIAGNOSIS — Z79899 Other long term (current) drug therapy: Secondary | ICD-10-CM | POA: Diagnosis not present

## 2017-07-04 DIAGNOSIS — D631 Anemia in chronic kidney disease: Secondary | ICD-10-CM | POA: Diagnosis not present

## 2017-07-05 DIAGNOSIS — E440 Moderate protein-calorie malnutrition: Secondary | ICD-10-CM | POA: Diagnosis not present

## 2017-07-05 DIAGNOSIS — N186 End stage renal disease: Secondary | ICD-10-CM | POA: Diagnosis not present

## 2017-07-05 DIAGNOSIS — N2581 Secondary hyperparathyroidism of renal origin: Secondary | ICD-10-CM | POA: Diagnosis not present

## 2017-07-05 DIAGNOSIS — Z79899 Other long term (current) drug therapy: Secondary | ICD-10-CM | POA: Diagnosis not present

## 2017-07-05 DIAGNOSIS — D509 Iron deficiency anemia, unspecified: Secondary | ICD-10-CM | POA: Diagnosis not present

## 2017-07-05 DIAGNOSIS — D631 Anemia in chronic kidney disease: Secondary | ICD-10-CM | POA: Diagnosis not present

## 2017-07-06 DIAGNOSIS — Z79899 Other long term (current) drug therapy: Secondary | ICD-10-CM | POA: Diagnosis not present

## 2017-07-06 DIAGNOSIS — E440 Moderate protein-calorie malnutrition: Secondary | ICD-10-CM | POA: Diagnosis not present

## 2017-07-06 DIAGNOSIS — N186 End stage renal disease: Secondary | ICD-10-CM | POA: Diagnosis not present

## 2017-07-06 DIAGNOSIS — N2581 Secondary hyperparathyroidism of renal origin: Secondary | ICD-10-CM | POA: Diagnosis not present

## 2017-07-06 DIAGNOSIS — D631 Anemia in chronic kidney disease: Secondary | ICD-10-CM | POA: Diagnosis not present

## 2017-07-06 DIAGNOSIS — D509 Iron deficiency anemia, unspecified: Secondary | ICD-10-CM | POA: Diagnosis not present

## 2017-07-07 DIAGNOSIS — Z79899 Other long term (current) drug therapy: Secondary | ICD-10-CM | POA: Diagnosis not present

## 2017-07-07 DIAGNOSIS — D631 Anemia in chronic kidney disease: Secondary | ICD-10-CM | POA: Diagnosis not present

## 2017-07-07 DIAGNOSIS — N2581 Secondary hyperparathyroidism of renal origin: Secondary | ICD-10-CM | POA: Diagnosis not present

## 2017-07-07 DIAGNOSIS — E440 Moderate protein-calorie malnutrition: Secondary | ICD-10-CM | POA: Diagnosis not present

## 2017-07-07 DIAGNOSIS — N186 End stage renal disease: Secondary | ICD-10-CM | POA: Diagnosis not present

## 2017-07-07 DIAGNOSIS — D509 Iron deficiency anemia, unspecified: Secondary | ICD-10-CM | POA: Diagnosis not present

## 2017-07-08 DIAGNOSIS — E440 Moderate protein-calorie malnutrition: Secondary | ICD-10-CM | POA: Diagnosis not present

## 2017-07-08 DIAGNOSIS — Z79899 Other long term (current) drug therapy: Secondary | ICD-10-CM | POA: Diagnosis not present

## 2017-07-08 DIAGNOSIS — N186 End stage renal disease: Secondary | ICD-10-CM | POA: Diagnosis not present

## 2017-07-08 DIAGNOSIS — D509 Iron deficiency anemia, unspecified: Secondary | ICD-10-CM | POA: Diagnosis not present

## 2017-07-08 DIAGNOSIS — N2581 Secondary hyperparathyroidism of renal origin: Secondary | ICD-10-CM | POA: Diagnosis not present

## 2017-07-08 DIAGNOSIS — D631 Anemia in chronic kidney disease: Secondary | ICD-10-CM | POA: Diagnosis not present

## 2017-07-09 DIAGNOSIS — D509 Iron deficiency anemia, unspecified: Secondary | ICD-10-CM | POA: Diagnosis not present

## 2017-07-09 DIAGNOSIS — Z79899 Other long term (current) drug therapy: Secondary | ICD-10-CM | POA: Diagnosis not present

## 2017-07-09 DIAGNOSIS — D631 Anemia in chronic kidney disease: Secondary | ICD-10-CM | POA: Diagnosis not present

## 2017-07-09 DIAGNOSIS — N2581 Secondary hyperparathyroidism of renal origin: Secondary | ICD-10-CM | POA: Diagnosis not present

## 2017-07-09 DIAGNOSIS — N186 End stage renal disease: Secondary | ICD-10-CM | POA: Diagnosis not present

## 2017-07-09 DIAGNOSIS — E440 Moderate protein-calorie malnutrition: Secondary | ICD-10-CM | POA: Diagnosis not present

## 2017-07-10 DIAGNOSIS — D509 Iron deficiency anemia, unspecified: Secondary | ICD-10-CM | POA: Diagnosis not present

## 2017-07-10 DIAGNOSIS — N186 End stage renal disease: Secondary | ICD-10-CM | POA: Diagnosis not present

## 2017-07-10 DIAGNOSIS — E440 Moderate protein-calorie malnutrition: Secondary | ICD-10-CM | POA: Diagnosis not present

## 2017-07-10 DIAGNOSIS — N2581 Secondary hyperparathyroidism of renal origin: Secondary | ICD-10-CM | POA: Diagnosis not present

## 2017-07-10 DIAGNOSIS — Z79899 Other long term (current) drug therapy: Secondary | ICD-10-CM | POA: Diagnosis not present

## 2017-07-10 DIAGNOSIS — D631 Anemia in chronic kidney disease: Secondary | ICD-10-CM | POA: Diagnosis not present

## 2017-07-11 DIAGNOSIS — Z79899 Other long term (current) drug therapy: Secondary | ICD-10-CM | POA: Diagnosis not present

## 2017-07-11 DIAGNOSIS — D631 Anemia in chronic kidney disease: Secondary | ICD-10-CM | POA: Diagnosis not present

## 2017-07-11 DIAGNOSIS — N186 End stage renal disease: Secondary | ICD-10-CM | POA: Diagnosis not present

## 2017-07-11 DIAGNOSIS — N2581 Secondary hyperparathyroidism of renal origin: Secondary | ICD-10-CM | POA: Diagnosis not present

## 2017-07-11 DIAGNOSIS — E440 Moderate protein-calorie malnutrition: Secondary | ICD-10-CM | POA: Diagnosis not present

## 2017-07-11 DIAGNOSIS — D509 Iron deficiency anemia, unspecified: Secondary | ICD-10-CM | POA: Diagnosis not present

## 2017-07-12 DIAGNOSIS — N186 End stage renal disease: Secondary | ICD-10-CM | POA: Diagnosis not present

## 2017-07-12 DIAGNOSIS — E440 Moderate protein-calorie malnutrition: Secondary | ICD-10-CM | POA: Diagnosis not present

## 2017-07-12 DIAGNOSIS — N2581 Secondary hyperparathyroidism of renal origin: Secondary | ICD-10-CM | POA: Diagnosis not present

## 2017-07-12 DIAGNOSIS — D631 Anemia in chronic kidney disease: Secondary | ICD-10-CM | POA: Diagnosis not present

## 2017-07-12 DIAGNOSIS — D509 Iron deficiency anemia, unspecified: Secondary | ICD-10-CM | POA: Diagnosis not present

## 2017-07-12 DIAGNOSIS — Z79899 Other long term (current) drug therapy: Secondary | ICD-10-CM | POA: Diagnosis not present

## 2017-07-13 DIAGNOSIS — D509 Iron deficiency anemia, unspecified: Secondary | ICD-10-CM | POA: Diagnosis not present

## 2017-07-13 DIAGNOSIS — D631 Anemia in chronic kidney disease: Secondary | ICD-10-CM | POA: Diagnosis not present

## 2017-07-13 DIAGNOSIS — N2581 Secondary hyperparathyroidism of renal origin: Secondary | ICD-10-CM | POA: Diagnosis not present

## 2017-07-13 DIAGNOSIS — E440 Moderate protein-calorie malnutrition: Secondary | ICD-10-CM | POA: Diagnosis not present

## 2017-07-13 DIAGNOSIS — Z79899 Other long term (current) drug therapy: Secondary | ICD-10-CM | POA: Diagnosis not present

## 2017-07-13 DIAGNOSIS — N186 End stage renal disease: Secondary | ICD-10-CM | POA: Diagnosis not present

## 2017-07-14 DIAGNOSIS — N2581 Secondary hyperparathyroidism of renal origin: Secondary | ICD-10-CM | POA: Diagnosis not present

## 2017-07-14 DIAGNOSIS — D509 Iron deficiency anemia, unspecified: Secondary | ICD-10-CM | POA: Diagnosis not present

## 2017-07-14 DIAGNOSIS — D631 Anemia in chronic kidney disease: Secondary | ICD-10-CM | POA: Diagnosis not present

## 2017-07-14 DIAGNOSIS — Z79899 Other long term (current) drug therapy: Secondary | ICD-10-CM | POA: Diagnosis not present

## 2017-07-14 DIAGNOSIS — N186 End stage renal disease: Secondary | ICD-10-CM | POA: Diagnosis not present

## 2017-07-14 DIAGNOSIS — E440 Moderate protein-calorie malnutrition: Secondary | ICD-10-CM | POA: Diagnosis not present

## 2017-07-15 DIAGNOSIS — D631 Anemia in chronic kidney disease: Secondary | ICD-10-CM | POA: Diagnosis not present

## 2017-07-15 DIAGNOSIS — Z79899 Other long term (current) drug therapy: Secondary | ICD-10-CM | POA: Diagnosis not present

## 2017-07-15 DIAGNOSIS — D509 Iron deficiency anemia, unspecified: Secondary | ICD-10-CM | POA: Diagnosis not present

## 2017-07-15 DIAGNOSIS — E440 Moderate protein-calorie malnutrition: Secondary | ICD-10-CM | POA: Diagnosis not present

## 2017-07-15 DIAGNOSIS — N2581 Secondary hyperparathyroidism of renal origin: Secondary | ICD-10-CM | POA: Diagnosis not present

## 2017-07-15 DIAGNOSIS — N186 End stage renal disease: Secondary | ICD-10-CM | POA: Diagnosis not present

## 2017-07-16 DIAGNOSIS — N186 End stage renal disease: Secondary | ICD-10-CM | POA: Diagnosis not present

## 2017-07-16 DIAGNOSIS — D509 Iron deficiency anemia, unspecified: Secondary | ICD-10-CM | POA: Diagnosis not present

## 2017-07-16 DIAGNOSIS — Z79899 Other long term (current) drug therapy: Secondary | ICD-10-CM | POA: Diagnosis not present

## 2017-07-16 DIAGNOSIS — N2581 Secondary hyperparathyroidism of renal origin: Secondary | ICD-10-CM | POA: Diagnosis not present

## 2017-07-16 DIAGNOSIS — D631 Anemia in chronic kidney disease: Secondary | ICD-10-CM | POA: Diagnosis not present

## 2017-07-16 DIAGNOSIS — E440 Moderate protein-calorie malnutrition: Secondary | ICD-10-CM | POA: Diagnosis not present

## 2017-07-17 DIAGNOSIS — D509 Iron deficiency anemia, unspecified: Secondary | ICD-10-CM | POA: Diagnosis not present

## 2017-07-17 DIAGNOSIS — E440 Moderate protein-calorie malnutrition: Secondary | ICD-10-CM | POA: Diagnosis not present

## 2017-07-17 DIAGNOSIS — N2581 Secondary hyperparathyroidism of renal origin: Secondary | ICD-10-CM | POA: Diagnosis not present

## 2017-07-17 DIAGNOSIS — N186 End stage renal disease: Secondary | ICD-10-CM | POA: Diagnosis not present

## 2017-07-17 DIAGNOSIS — Z79899 Other long term (current) drug therapy: Secondary | ICD-10-CM | POA: Diagnosis not present

## 2017-07-17 DIAGNOSIS — D631 Anemia in chronic kidney disease: Secondary | ICD-10-CM | POA: Diagnosis not present

## 2017-07-18 DIAGNOSIS — Z79899 Other long term (current) drug therapy: Secondary | ICD-10-CM | POA: Diagnosis not present

## 2017-07-18 DIAGNOSIS — D631 Anemia in chronic kidney disease: Secondary | ICD-10-CM | POA: Diagnosis not present

## 2017-07-18 DIAGNOSIS — N2581 Secondary hyperparathyroidism of renal origin: Secondary | ICD-10-CM | POA: Diagnosis not present

## 2017-07-18 DIAGNOSIS — N186 End stage renal disease: Secondary | ICD-10-CM | POA: Diagnosis not present

## 2017-07-18 DIAGNOSIS — D509 Iron deficiency anemia, unspecified: Secondary | ICD-10-CM | POA: Diagnosis not present

## 2017-07-18 DIAGNOSIS — E440 Moderate protein-calorie malnutrition: Secondary | ICD-10-CM | POA: Diagnosis not present

## 2017-07-19 DIAGNOSIS — N2581 Secondary hyperparathyroidism of renal origin: Secondary | ICD-10-CM | POA: Diagnosis not present

## 2017-07-19 DIAGNOSIS — D509 Iron deficiency anemia, unspecified: Secondary | ICD-10-CM | POA: Diagnosis not present

## 2017-07-19 DIAGNOSIS — N186 End stage renal disease: Secondary | ICD-10-CM | POA: Diagnosis not present

## 2017-07-19 DIAGNOSIS — Z79899 Other long term (current) drug therapy: Secondary | ICD-10-CM | POA: Diagnosis not present

## 2017-07-19 DIAGNOSIS — D631 Anemia in chronic kidney disease: Secondary | ICD-10-CM | POA: Diagnosis not present

## 2017-07-19 DIAGNOSIS — E440 Moderate protein-calorie malnutrition: Secondary | ICD-10-CM | POA: Diagnosis not present

## 2017-07-20 DIAGNOSIS — N2581 Secondary hyperparathyroidism of renal origin: Secondary | ICD-10-CM | POA: Diagnosis not present

## 2017-07-20 DIAGNOSIS — N186 End stage renal disease: Secondary | ICD-10-CM | POA: Diagnosis not present

## 2017-07-20 DIAGNOSIS — E440 Moderate protein-calorie malnutrition: Secondary | ICD-10-CM | POA: Diagnosis not present

## 2017-07-20 DIAGNOSIS — Z79899 Other long term (current) drug therapy: Secondary | ICD-10-CM | POA: Diagnosis not present

## 2017-07-20 DIAGNOSIS — D509 Iron deficiency anemia, unspecified: Secondary | ICD-10-CM | POA: Diagnosis not present

## 2017-07-20 DIAGNOSIS — D631 Anemia in chronic kidney disease: Secondary | ICD-10-CM | POA: Diagnosis not present

## 2017-07-21 DIAGNOSIS — Z79899 Other long term (current) drug therapy: Secondary | ICD-10-CM | POA: Diagnosis not present

## 2017-07-21 DIAGNOSIS — N2581 Secondary hyperparathyroidism of renal origin: Secondary | ICD-10-CM | POA: Diagnosis not present

## 2017-07-21 DIAGNOSIS — E440 Moderate protein-calorie malnutrition: Secondary | ICD-10-CM | POA: Diagnosis not present

## 2017-07-21 DIAGNOSIS — D509 Iron deficiency anemia, unspecified: Secondary | ICD-10-CM | POA: Diagnosis not present

## 2017-07-21 DIAGNOSIS — N186 End stage renal disease: Secondary | ICD-10-CM | POA: Diagnosis not present

## 2017-07-21 DIAGNOSIS — D631 Anemia in chronic kidney disease: Secondary | ICD-10-CM | POA: Diagnosis not present

## 2017-07-22 DIAGNOSIS — D509 Iron deficiency anemia, unspecified: Secondary | ICD-10-CM | POA: Diagnosis not present

## 2017-07-22 DIAGNOSIS — E440 Moderate protein-calorie malnutrition: Secondary | ICD-10-CM | POA: Diagnosis not present

## 2017-07-22 DIAGNOSIS — D631 Anemia in chronic kidney disease: Secondary | ICD-10-CM | POA: Diagnosis not present

## 2017-07-22 DIAGNOSIS — Z79899 Other long term (current) drug therapy: Secondary | ICD-10-CM | POA: Diagnosis not present

## 2017-07-22 DIAGNOSIS — N2581 Secondary hyperparathyroidism of renal origin: Secondary | ICD-10-CM | POA: Diagnosis not present

## 2017-07-22 DIAGNOSIS — N186 End stage renal disease: Secondary | ICD-10-CM | POA: Diagnosis not present

## 2017-07-23 DIAGNOSIS — N2581 Secondary hyperparathyroidism of renal origin: Secondary | ICD-10-CM | POA: Diagnosis not present

## 2017-07-23 DIAGNOSIS — D631 Anemia in chronic kidney disease: Secondary | ICD-10-CM | POA: Diagnosis not present

## 2017-07-23 DIAGNOSIS — E440 Moderate protein-calorie malnutrition: Secondary | ICD-10-CM | POA: Diagnosis not present

## 2017-07-23 DIAGNOSIS — N186 End stage renal disease: Secondary | ICD-10-CM | POA: Diagnosis not present

## 2017-07-23 DIAGNOSIS — D509 Iron deficiency anemia, unspecified: Secondary | ICD-10-CM | POA: Diagnosis not present

## 2017-07-23 DIAGNOSIS — Z79899 Other long term (current) drug therapy: Secondary | ICD-10-CM | POA: Diagnosis not present

## 2017-07-24 DIAGNOSIS — N186 End stage renal disease: Secondary | ICD-10-CM | POA: Diagnosis not present

## 2017-07-24 DIAGNOSIS — D631 Anemia in chronic kidney disease: Secondary | ICD-10-CM | POA: Diagnosis not present

## 2017-07-24 DIAGNOSIS — E440 Moderate protein-calorie malnutrition: Secondary | ICD-10-CM | POA: Diagnosis not present

## 2017-07-24 DIAGNOSIS — Z79899 Other long term (current) drug therapy: Secondary | ICD-10-CM | POA: Diagnosis not present

## 2017-07-24 DIAGNOSIS — N2581 Secondary hyperparathyroidism of renal origin: Secondary | ICD-10-CM | POA: Diagnosis not present

## 2017-07-24 DIAGNOSIS — D509 Iron deficiency anemia, unspecified: Secondary | ICD-10-CM | POA: Diagnosis not present

## 2017-07-25 ENCOUNTER — Ambulatory Visit (INDEPENDENT_AMBULATORY_CARE_PROVIDER_SITE_OTHER): Payer: Medicare Other | Admitting: Family Medicine

## 2017-07-25 ENCOUNTER — Encounter: Payer: Self-pay | Admitting: Family Medicine

## 2017-07-25 VITALS — BP 140/80 | HR 68 | Ht 67.5 in | Wt 169.8 lb

## 2017-07-25 DIAGNOSIS — Z794 Long term (current) use of insulin: Secondary | ICD-10-CM

## 2017-07-25 DIAGNOSIS — D631 Anemia in chronic kidney disease: Secondary | ICD-10-CM | POA: Diagnosis not present

## 2017-07-25 DIAGNOSIS — D509 Iron deficiency anemia, unspecified: Secondary | ICD-10-CM | POA: Diagnosis not present

## 2017-07-25 DIAGNOSIS — N186 End stage renal disease: Secondary | ICD-10-CM | POA: Diagnosis not present

## 2017-07-25 DIAGNOSIS — Z992 Dependence on renal dialysis: Secondary | ICD-10-CM | POA: Diagnosis not present

## 2017-07-25 DIAGNOSIS — N2581 Secondary hyperparathyroidism of renal origin: Secondary | ICD-10-CM | POA: Diagnosis not present

## 2017-07-25 DIAGNOSIS — Z0000 Encounter for general adult medical examination without abnormal findings: Secondary | ICD-10-CM | POA: Diagnosis not present

## 2017-07-25 DIAGNOSIS — E0865 Diabetes mellitus due to underlying condition with hyperglycemia: Secondary | ICD-10-CM | POA: Diagnosis not present

## 2017-07-25 DIAGNOSIS — Z79899 Other long term (current) drug therapy: Secondary | ICD-10-CM | POA: Diagnosis not present

## 2017-07-25 DIAGNOSIS — E440 Moderate protein-calorie malnutrition: Secondary | ICD-10-CM | POA: Diagnosis not present

## 2017-07-25 DIAGNOSIS — IMO0001 Reserved for inherently not codable concepts without codable children: Secondary | ICD-10-CM

## 2017-07-25 NOTE — Patient Instructions (Signed)
You will get a call regarding Cologuard and will receive a package in the mail. This is a screening test for colon cancer. The instructions will be included.   Call and schedule a diabetic eye exam.  You can do this at the [facility] at [address], [phone] or anywhere you choose.   When you next get labs done, you could see if they would add a hepatitis C antibody test since you prefer to not have blood drawn here today.   If you decide to get a flu shot, you can return here or get this at your pharmacy.

## 2017-07-25 NOTE — Progress Notes (Signed)
Jared [name] is a 61 y.o. male who presents for annual wellness visit and follow-up on chronic medical conditions.  He has the following concerns:  States he does not want to have any labs or immunizations today. States he is tired of having so many visits to doctors offices.   States he has an appointment with Dr. Lorrene Reid on Friday and she will check his blood work.  He is doing daily PD and no concerns about this.   He sees Dr. Lissa Merlin for diabetes. States his blood sugars are running around 150. States he checks his blood sugar 7 times per day. No concerns with this. Denies any hypoglycemia or hyperglycemic episodes.    There is no immunization history for the selected administration types on file for this patient. Last colonoscopy: he does not recall having a colonoscopy.  Refuses colonoscopy but agrees to have a cologuard if this is approved by his insurance.   Last PSA: has this done at his neprologist.  Dentist: 6-7 months ago Ophtho: year and half ago Exercise: everyday walking  Jared doctors caring for patient include- Nephrologist- Dr. Lorrene Reid Endocrinology- Dr. Loanne Drilling (states he does not want to go back) Neurology- Dr. Hassell Done Dr. [name] Cancer Doctor for prostate- forgot the doctor's name goes every 6 months Pain Management- Dr. Naaman Plummer  Depression screen:  See questionnaire below.     Depression screen [facility] 2/9 07/25/2017 06/06/2017  Decreased Interest 0 1  Down, Depressed, Hopeless 0 0  PHQ - 2 Score 0 1  Altered sleeping - 2  Tired, decreased energy - 0  Change in appetite - 2  Feeling bad or failure about yourself  - 0  Trouble concentrating - 0  Moving slowly or fidgety/restless - 0  Suicidal thoughts - 0  PHQ-9 Score - 5  Difficult doing work/chores - Somewhat difficult    Fall Screen: See Questionaire below.   Fall Risk  07/25/2017 12/25/2016  Falls in the past year? No No    ADL screen:  See questionnaire below.  Functional Status Survey: Is the patient  deaf or have difficulty hearing?: No Does the patient have difficulty seeing, even when wearing glasses/contacts?: No Does the patient have difficulty concentrating, remembering, or making decisions?: No Does the patient have difficulty walking or climbing stairs?: No Does the patient have difficulty dressing or bathing?: No Does the patient have difficulty doing errands alone such as visiting a doctor's office or shopping?: No   End of Life Discussion:  Patient has a living will and medical power of attorney   Review of Systems  Constitutional: -fever, -chills, -sweats, -unexpected weight change, -anorexia, -fatigue Allergy: -sneezing, -itching, -congestion Dermatology: denies changing moles, rash, lumps, new worrisome lesions ENT: -runny nose, -ear pain, -sore throat, -hoarseness, -sinus pain, -teeth pain, -tinnitus, -hearing loss, -epistaxis Cardiology:  -chest pain, -palpitations, -edema, -orthopnea, -paroxysmal nocturnal dyspnea Respiratory: -cough, -shortness of breath, -dyspnea on exertion, -wheezing, -hemoptysis Gastroenterology: -abdominal pain, -nausea, -vomiting, -diarrhea, -constipation, -blood in stool, -changes in bowel movement, -dysphagia Hematology: -bleeding or bruising problems Musculoskeletal: -arthralgias, -myalgias, -joint swelling, -back pain, -neck pain, -cramping, -gait changes Ophthalmology: -vision changes, -eye redness, -itching, -discharge Urology: -dysuria, -difficulty urinating, -hematuria, -urinary frequency, -urgency, incontinence Neurology: -headache, -weakness, -tingling, -numbness, -speech abnormality, -memory loss, -falls, -dizziness Psychology:  -depressed mood, -agitation, -sleep problems   PHYSICAL EXAM:  BP 140/80   Pulse 68   Ht 5' 7.5" (1.715 m)   Wt 169 lb 12.8 oz (77 kg)   BMI 26.20 kg/m  General Appearance: Alert, cooperative, no distress, appears stated age Head: Normocephalic, without obvious abnormality, atraumatic Eyes: PERRL,  conjunctiva/corneas clear, EOM's intact, fundi benign Ears: Normal TM's and external ear canals Nose: Nares normal, mucosa normal, no drainage or sinus  tenderness Throat: Lips, mucosa, and tongue normal; teeth and gums normal Neck: Supple, no lymphadenopathy, thyroid:no enlargement/tenderness/nodules Back: Spine nontender, no curvature, ROM normal, no CVA tenderness Lungs: Clear to auscultation bilaterally without wheezes, rales or ronchi; respirations unlabored Chest Wall: No tenderness or deformity Heart: Regular rate and rhythm, S1 and S2 normal, no murmur, rub or gallop Breast Exam: No chest wall tenderness, masses or gynecomastia Abdomen: Soft, non-tender, nondistended, normoactive bowel sounds, no masses, no hepatosplenomegaly Extremities: No clubbing, cyanosis or edema Pulses: 2+ and symmetric all extremities Skin: Skin color, texture, turgor normal, no rashes or lesions Lymph nodes: Cervical, supraclavicular, and axillary nodes normal Neurologic: CNII-XII intact, normal strength, sensation and gait; reflexes 2+ and symmetric throughout   Psych: Normal mood, affect, hygiene and grooming  ASSESSMENT/PLAN: Medicare annual wellness visit, subsequent  Diabetes mellitus due to underlying condition, uncontrolled, without complication, with long-term current use of insulin (HCC)  CKD (chronic kidney disease) stage V requiring chronic dialysis ([place])  He is adamant that he does not want blood work today.  Appears to be managing his diabetes with the help of Dr. Loanne Drilling.  No concerns with PD and will see his nephrologist next week. States he will have labs done there.  No recent falls or sign of depression. He is living with his sister and denies any concerns.  Recommend he get a diabetic eye exam, he cannot recall having one.  Discussed that he is overdue for a hepatitis C screening test due to his age group. He declines.  Cologuard ordered.  Refuses flu shot or any Jared  immunizations.  Follow up in 6 months or sooner if needed.    Discussed PSA screening (risks/benefits), recommended at least 30 minutes of aerobic activity at least 5 days/week; proper sunscreen use reviewed; healthy diet and alcohol recommendations (less than or equal to 2 drinks/day) reviewed; regular seatbelt use; changing batteries in smoke detectors. Immunization recommendations discussed.  Colonoscopy recommendations reviewed.   Medicare Attestation I have personally reviewed: The patient's medical and social history Their use of alcohol, tobacco or illicit drugs Their current medications and supplements The patient's functional ability including ADLs,fall risks, home safety risks, cognitive, and hearing and visual impairment Diet and physical activities Evidence for depression or mood disorders  The patient's weight, height, and BMI have been recorded in the chart.  I have made referrals, counseling, and provided education to the patient based on review of the above and I have provided the patient with a written personalized care plan for preventive services.     Harland Dingwall, NP-C   07/25/2017

## 2017-07-26 ENCOUNTER — Other Ambulatory Visit: Payer: Self-pay | Admitting: Family Medicine

## 2017-07-26 DIAGNOSIS — E440 Moderate protein-calorie malnutrition: Secondary | ICD-10-CM | POA: Diagnosis not present

## 2017-07-26 DIAGNOSIS — N186 End stage renal disease: Secondary | ICD-10-CM | POA: Diagnosis not present

## 2017-07-26 DIAGNOSIS — D509 Iron deficiency anemia, unspecified: Secondary | ICD-10-CM | POA: Diagnosis not present

## 2017-07-26 DIAGNOSIS — D631 Anemia in chronic kidney disease: Secondary | ICD-10-CM | POA: Diagnosis not present

## 2017-07-26 DIAGNOSIS — N2581 Secondary hyperparathyroidism of renal origin: Secondary | ICD-10-CM | POA: Diagnosis not present

## 2017-07-26 DIAGNOSIS — Z79899 Other long term (current) drug therapy: Secondary | ICD-10-CM | POA: Diagnosis not present

## 2017-07-26 NOTE — Telephone Encounter (Signed)
ok 

## 2017-07-26 NOTE — Telephone Encounter (Signed)
Is this okay to refill? 

## 2017-07-27 DIAGNOSIS — N2581 Secondary hyperparathyroidism of renal origin: Secondary | ICD-10-CM | POA: Diagnosis not present

## 2017-07-27 DIAGNOSIS — D631 Anemia in chronic kidney disease: Secondary | ICD-10-CM | POA: Diagnosis not present

## 2017-07-27 DIAGNOSIS — Z79899 Other long term (current) drug therapy: Secondary | ICD-10-CM | POA: Diagnosis not present

## 2017-07-27 DIAGNOSIS — E440 Moderate protein-calorie malnutrition: Secondary | ICD-10-CM | POA: Diagnosis not present

## 2017-07-27 DIAGNOSIS — D509 Iron deficiency anemia, unspecified: Secondary | ICD-10-CM | POA: Diagnosis not present

## 2017-07-27 DIAGNOSIS — Z992 Dependence on renal dialysis: Secondary | ICD-10-CM | POA: Diagnosis not present

## 2017-07-27 DIAGNOSIS — I129 Hypertensive chronic kidney disease with stage 1 through stage 4 chronic kidney disease, or unspecified chronic kidney disease: Secondary | ICD-10-CM | POA: Diagnosis not present

## 2017-07-27 DIAGNOSIS — N186 End stage renal disease: Secondary | ICD-10-CM | POA: Diagnosis not present

## 2017-07-28 DIAGNOSIS — Z5189 Encounter for other specified aftercare: Secondary | ICD-10-CM | POA: Diagnosis not present

## 2017-07-28 DIAGNOSIS — Z79899 Other long term (current) drug therapy: Secondary | ICD-10-CM | POA: Diagnosis not present

## 2017-07-28 DIAGNOSIS — N2589 Other disorders resulting from impaired renal tubular function: Secondary | ICD-10-CM | POA: Diagnosis not present

## 2017-07-28 DIAGNOSIS — D631 Anemia in chronic kidney disease: Secondary | ICD-10-CM | POA: Diagnosis not present

## 2017-07-28 DIAGNOSIS — E440 Moderate protein-calorie malnutrition: Secondary | ICD-10-CM | POA: Diagnosis not present

## 2017-07-28 DIAGNOSIS — D509 Iron deficiency anemia, unspecified: Secondary | ICD-10-CM | POA: Diagnosis not present

## 2017-07-28 DIAGNOSIS — N186 End stage renal disease: Secondary | ICD-10-CM | POA: Diagnosis not present

## 2017-07-28 DIAGNOSIS — N2581 Secondary hyperparathyroidism of renal origin: Secondary | ICD-10-CM | POA: Diagnosis not present

## 2017-07-29 DIAGNOSIS — D631 Anemia in chronic kidney disease: Secondary | ICD-10-CM | POA: Diagnosis not present

## 2017-07-29 DIAGNOSIS — N186 End stage renal disease: Secondary | ICD-10-CM | POA: Diagnosis not present

## 2017-07-29 DIAGNOSIS — N2589 Other disorders resulting from impaired renal tubular function: Secondary | ICD-10-CM | POA: Diagnosis not present

## 2017-07-29 DIAGNOSIS — Z5189 Encounter for other specified aftercare: Secondary | ICD-10-CM | POA: Diagnosis not present

## 2017-07-29 DIAGNOSIS — Z79899 Other long term (current) drug therapy: Secondary | ICD-10-CM | POA: Diagnosis not present

## 2017-07-29 DIAGNOSIS — D509 Iron deficiency anemia, unspecified: Secondary | ICD-10-CM | POA: Diagnosis not present

## 2017-07-30 DIAGNOSIS — Z5189 Encounter for other specified aftercare: Secondary | ICD-10-CM | POA: Diagnosis not present

## 2017-07-30 DIAGNOSIS — D509 Iron deficiency anemia, unspecified: Secondary | ICD-10-CM | POA: Diagnosis not present

## 2017-07-30 DIAGNOSIS — N186 End stage renal disease: Secondary | ICD-10-CM | POA: Diagnosis not present

## 2017-07-30 DIAGNOSIS — Z79899 Other long term (current) drug therapy: Secondary | ICD-10-CM | POA: Diagnosis not present

## 2017-07-30 DIAGNOSIS — D631 Anemia in chronic kidney disease: Secondary | ICD-10-CM | POA: Diagnosis not present

## 2017-07-30 DIAGNOSIS — N2589 Other disorders resulting from impaired renal tubular function: Secondary | ICD-10-CM | POA: Diagnosis not present

## 2017-07-31 DIAGNOSIS — Z5189 Encounter for other specified aftercare: Secondary | ICD-10-CM | POA: Diagnosis not present

## 2017-07-31 DIAGNOSIS — N186 End stage renal disease: Secondary | ICD-10-CM | POA: Diagnosis not present

## 2017-07-31 DIAGNOSIS — Z79899 Other long term (current) drug therapy: Secondary | ICD-10-CM | POA: Diagnosis not present

## 2017-07-31 DIAGNOSIS — N2589 Other disorders resulting from impaired renal tubular function: Secondary | ICD-10-CM | POA: Diagnosis not present

## 2017-07-31 DIAGNOSIS — D509 Iron deficiency anemia, unspecified: Secondary | ICD-10-CM | POA: Diagnosis not present

## 2017-07-31 DIAGNOSIS — D631 Anemia in chronic kidney disease: Secondary | ICD-10-CM | POA: Diagnosis not present

## 2017-08-01 DIAGNOSIS — D631 Anemia in chronic kidney disease: Secondary | ICD-10-CM | POA: Diagnosis not present

## 2017-08-01 DIAGNOSIS — Z5189 Encounter for other specified aftercare: Secondary | ICD-10-CM | POA: Diagnosis not present

## 2017-08-01 DIAGNOSIS — Z79899 Other long term (current) drug therapy: Secondary | ICD-10-CM | POA: Diagnosis not present

## 2017-08-01 DIAGNOSIS — N186 End stage renal disease: Secondary | ICD-10-CM | POA: Diagnosis not present

## 2017-08-01 DIAGNOSIS — D509 Iron deficiency anemia, unspecified: Secondary | ICD-10-CM | POA: Diagnosis not present

## 2017-08-01 DIAGNOSIS — N2589 Other disorders resulting from impaired renal tubular function: Secondary | ICD-10-CM | POA: Diagnosis not present

## 2017-08-02 DIAGNOSIS — D631 Anemia in chronic kidney disease: Secondary | ICD-10-CM | POA: Diagnosis not present

## 2017-08-02 DIAGNOSIS — D509 Iron deficiency anemia, unspecified: Secondary | ICD-10-CM | POA: Diagnosis not present

## 2017-08-02 DIAGNOSIS — Z79899 Other long term (current) drug therapy: Secondary | ICD-10-CM | POA: Diagnosis not present

## 2017-08-02 DIAGNOSIS — Z5189 Encounter for other specified aftercare: Secondary | ICD-10-CM | POA: Diagnosis not present

## 2017-08-02 DIAGNOSIS — N2589 Other disorders resulting from impaired renal tubular function: Secondary | ICD-10-CM | POA: Diagnosis not present

## 2017-08-02 DIAGNOSIS — C61 Malignant neoplasm of prostate: Secondary | ICD-10-CM | POA: Diagnosis not present

## 2017-08-02 DIAGNOSIS — N186 End stage renal disease: Secondary | ICD-10-CM | POA: Diagnosis not present

## 2017-08-03 DIAGNOSIS — D509 Iron deficiency anemia, unspecified: Secondary | ICD-10-CM | POA: Diagnosis not present

## 2017-08-03 DIAGNOSIS — N2589 Other disorders resulting from impaired renal tubular function: Secondary | ICD-10-CM | POA: Diagnosis not present

## 2017-08-03 DIAGNOSIS — D631 Anemia in chronic kidney disease: Secondary | ICD-10-CM | POA: Diagnosis not present

## 2017-08-03 DIAGNOSIS — Z5189 Encounter for other specified aftercare: Secondary | ICD-10-CM | POA: Diagnosis not present

## 2017-08-03 DIAGNOSIS — N186 End stage renal disease: Secondary | ICD-10-CM | POA: Diagnosis not present

## 2017-08-03 DIAGNOSIS — Z79899 Other long term (current) drug therapy: Secondary | ICD-10-CM | POA: Diagnosis not present

## 2017-08-04 DIAGNOSIS — D631 Anemia in chronic kidney disease: Secondary | ICD-10-CM | POA: Diagnosis not present

## 2017-08-04 DIAGNOSIS — Z79899 Other long term (current) drug therapy: Secondary | ICD-10-CM | POA: Diagnosis not present

## 2017-08-04 DIAGNOSIS — Z5189 Encounter for other specified aftercare: Secondary | ICD-10-CM | POA: Diagnosis not present

## 2017-08-04 DIAGNOSIS — D509 Iron deficiency anemia, unspecified: Secondary | ICD-10-CM | POA: Diagnosis not present

## 2017-08-04 DIAGNOSIS — N2589 Other disorders resulting from impaired renal tubular function: Secondary | ICD-10-CM | POA: Diagnosis not present

## 2017-08-04 DIAGNOSIS — N186 End stage renal disease: Secondary | ICD-10-CM | POA: Diagnosis not present

## 2017-08-05 DIAGNOSIS — Z79899 Other long term (current) drug therapy: Secondary | ICD-10-CM | POA: Diagnosis not present

## 2017-08-05 DIAGNOSIS — N186 End stage renal disease: Secondary | ICD-10-CM | POA: Diagnosis not present

## 2017-08-05 DIAGNOSIS — N2589 Other disorders resulting from impaired renal tubular function: Secondary | ICD-10-CM | POA: Diagnosis not present

## 2017-08-05 DIAGNOSIS — D509 Iron deficiency anemia, unspecified: Secondary | ICD-10-CM | POA: Diagnosis not present

## 2017-08-05 DIAGNOSIS — D631 Anemia in chronic kidney disease: Secondary | ICD-10-CM | POA: Diagnosis not present

## 2017-08-05 DIAGNOSIS — Z5189 Encounter for other specified aftercare: Secondary | ICD-10-CM | POA: Diagnosis not present

## 2017-08-06 DIAGNOSIS — D631 Anemia in chronic kidney disease: Secondary | ICD-10-CM | POA: Diagnosis not present

## 2017-08-06 DIAGNOSIS — N2589 Other disorders resulting from impaired renal tubular function: Secondary | ICD-10-CM | POA: Diagnosis not present

## 2017-08-06 DIAGNOSIS — D509 Iron deficiency anemia, unspecified: Secondary | ICD-10-CM | POA: Diagnosis not present

## 2017-08-06 DIAGNOSIS — R8299 Other abnormal findings in urine: Secondary | ICD-10-CM | POA: Diagnosis not present

## 2017-08-06 DIAGNOSIS — Z79899 Other long term (current) drug therapy: Secondary | ICD-10-CM | POA: Diagnosis not present

## 2017-08-06 DIAGNOSIS — N186 End stage renal disease: Secondary | ICD-10-CM | POA: Diagnosis not present

## 2017-08-06 DIAGNOSIS — Z5189 Encounter for other specified aftercare: Secondary | ICD-10-CM | POA: Diagnosis not present

## 2017-08-07 DIAGNOSIS — C61 Malignant neoplasm of prostate: Secondary | ICD-10-CM | POA: Diagnosis not present

## 2017-08-07 DIAGNOSIS — C775 Secondary and unspecified malignant neoplasm of intrapelvic lymph nodes: Secondary | ICD-10-CM | POA: Diagnosis not present

## 2017-08-07 DIAGNOSIS — Z5189 Encounter for other specified aftercare: Secondary | ICD-10-CM | POA: Diagnosis not present

## 2017-08-07 DIAGNOSIS — N281 Cyst of kidney, acquired: Secondary | ICD-10-CM | POA: Diagnosis not present

## 2017-08-07 DIAGNOSIS — D631 Anemia in chronic kidney disease: Secondary | ICD-10-CM | POA: Diagnosis not present

## 2017-08-07 DIAGNOSIS — D509 Iron deficiency anemia, unspecified: Secondary | ICD-10-CM | POA: Diagnosis not present

## 2017-08-07 DIAGNOSIS — N2589 Other disorders resulting from impaired renal tubular function: Secondary | ICD-10-CM | POA: Diagnosis not present

## 2017-08-07 DIAGNOSIS — N186 End stage renal disease: Secondary | ICD-10-CM | POA: Diagnosis not present

## 2017-08-07 DIAGNOSIS — Z79899 Other long term (current) drug therapy: Secondary | ICD-10-CM | POA: Diagnosis not present

## 2017-08-07 DIAGNOSIS — Z5111 Encounter for antineoplastic chemotherapy: Secondary | ICD-10-CM | POA: Diagnosis not present

## 2017-08-08 DIAGNOSIS — Z79899 Other long term (current) drug therapy: Secondary | ICD-10-CM | POA: Diagnosis not present

## 2017-08-08 DIAGNOSIS — N2589 Other disorders resulting from impaired renal tubular function: Secondary | ICD-10-CM | POA: Diagnosis not present

## 2017-08-08 DIAGNOSIS — Z5189 Encounter for other specified aftercare: Secondary | ICD-10-CM | POA: Diagnosis not present

## 2017-08-08 DIAGNOSIS — N186 End stage renal disease: Secondary | ICD-10-CM | POA: Diagnosis not present

## 2017-08-08 DIAGNOSIS — D509 Iron deficiency anemia, unspecified: Secondary | ICD-10-CM | POA: Diagnosis not present

## 2017-08-08 DIAGNOSIS — D631 Anemia in chronic kidney disease: Secondary | ICD-10-CM | POA: Diagnosis not present

## 2017-08-09 DIAGNOSIS — N186 End stage renal disease: Secondary | ICD-10-CM | POA: Diagnosis not present

## 2017-08-09 DIAGNOSIS — D631 Anemia in chronic kidney disease: Secondary | ICD-10-CM | POA: Diagnosis not present

## 2017-08-09 DIAGNOSIS — N2589 Other disorders resulting from impaired renal tubular function: Secondary | ICD-10-CM | POA: Diagnosis not present

## 2017-08-09 DIAGNOSIS — D509 Iron deficiency anemia, unspecified: Secondary | ICD-10-CM | POA: Diagnosis not present

## 2017-08-09 DIAGNOSIS — Z5189 Encounter for other specified aftercare: Secondary | ICD-10-CM | POA: Diagnosis not present

## 2017-08-09 DIAGNOSIS — Z79899 Other long term (current) drug therapy: Secondary | ICD-10-CM | POA: Diagnosis not present

## 2017-08-10 DIAGNOSIS — N186 End stage renal disease: Secondary | ICD-10-CM | POA: Diagnosis not present

## 2017-08-10 DIAGNOSIS — Z79899 Other long term (current) drug therapy: Secondary | ICD-10-CM | POA: Diagnosis not present

## 2017-08-10 DIAGNOSIS — D631 Anemia in chronic kidney disease: Secondary | ICD-10-CM | POA: Diagnosis not present

## 2017-08-10 DIAGNOSIS — N2589 Other disorders resulting from impaired renal tubular function: Secondary | ICD-10-CM | POA: Diagnosis not present

## 2017-08-10 DIAGNOSIS — Z5189 Encounter for other specified aftercare: Secondary | ICD-10-CM | POA: Diagnosis not present

## 2017-08-10 DIAGNOSIS — D509 Iron deficiency anemia, unspecified: Secondary | ICD-10-CM | POA: Diagnosis not present

## 2017-08-11 DIAGNOSIS — Z79899 Other long term (current) drug therapy: Secondary | ICD-10-CM | POA: Diagnosis not present

## 2017-08-11 DIAGNOSIS — N2589 Other disorders resulting from impaired renal tubular function: Secondary | ICD-10-CM | POA: Diagnosis not present

## 2017-08-11 DIAGNOSIS — Z5189 Encounter for other specified aftercare: Secondary | ICD-10-CM | POA: Diagnosis not present

## 2017-08-11 DIAGNOSIS — N186 End stage renal disease: Secondary | ICD-10-CM | POA: Diagnosis not present

## 2017-08-11 DIAGNOSIS — D631 Anemia in chronic kidney disease: Secondary | ICD-10-CM | POA: Diagnosis not present

## 2017-08-11 DIAGNOSIS — D509 Iron deficiency anemia, unspecified: Secondary | ICD-10-CM | POA: Diagnosis not present

## 2017-08-12 DIAGNOSIS — Z79899 Other long term (current) drug therapy: Secondary | ICD-10-CM | POA: Diagnosis not present

## 2017-08-12 DIAGNOSIS — N2589 Other disorders resulting from impaired renal tubular function: Secondary | ICD-10-CM | POA: Diagnosis not present

## 2017-08-12 DIAGNOSIS — Z5189 Encounter for other specified aftercare: Secondary | ICD-10-CM | POA: Diagnosis not present

## 2017-08-12 DIAGNOSIS — D509 Iron deficiency anemia, unspecified: Secondary | ICD-10-CM | POA: Diagnosis not present

## 2017-08-12 DIAGNOSIS — D631 Anemia in chronic kidney disease: Secondary | ICD-10-CM | POA: Diagnosis not present

## 2017-08-12 DIAGNOSIS — N186 End stage renal disease: Secondary | ICD-10-CM | POA: Diagnosis not present

## 2017-08-13 DIAGNOSIS — D631 Anemia in chronic kidney disease: Secondary | ICD-10-CM | POA: Diagnosis not present

## 2017-08-13 DIAGNOSIS — D509 Iron deficiency anemia, unspecified: Secondary | ICD-10-CM | POA: Diagnosis not present

## 2017-08-13 DIAGNOSIS — N186 End stage renal disease: Secondary | ICD-10-CM | POA: Diagnosis not present

## 2017-08-13 DIAGNOSIS — N2589 Other disorders resulting from impaired renal tubular function: Secondary | ICD-10-CM | POA: Diagnosis not present

## 2017-08-13 DIAGNOSIS — Z5189 Encounter for other specified aftercare: Secondary | ICD-10-CM | POA: Diagnosis not present

## 2017-08-13 DIAGNOSIS — Z79899 Other long term (current) drug therapy: Secondary | ICD-10-CM | POA: Diagnosis not present

## 2017-08-14 DIAGNOSIS — D631 Anemia in chronic kidney disease: Secondary | ICD-10-CM | POA: Diagnosis not present

## 2017-08-14 DIAGNOSIS — Z79899 Other long term (current) drug therapy: Secondary | ICD-10-CM | POA: Diagnosis not present

## 2017-08-14 DIAGNOSIS — D509 Iron deficiency anemia, unspecified: Secondary | ICD-10-CM | POA: Diagnosis not present

## 2017-08-14 DIAGNOSIS — N186 End stage renal disease: Secondary | ICD-10-CM | POA: Diagnosis not present

## 2017-08-14 DIAGNOSIS — N2589 Other disorders resulting from impaired renal tubular function: Secondary | ICD-10-CM | POA: Diagnosis not present

## 2017-08-14 DIAGNOSIS — Z5189 Encounter for other specified aftercare: Secondary | ICD-10-CM | POA: Diagnosis not present

## 2017-08-15 DIAGNOSIS — Z5189 Encounter for other specified aftercare: Secondary | ICD-10-CM | POA: Diagnosis not present

## 2017-08-15 DIAGNOSIS — D509 Iron deficiency anemia, unspecified: Secondary | ICD-10-CM | POA: Diagnosis not present

## 2017-08-15 DIAGNOSIS — D631 Anemia in chronic kidney disease: Secondary | ICD-10-CM | POA: Diagnosis not present

## 2017-08-15 DIAGNOSIS — N186 End stage renal disease: Secondary | ICD-10-CM | POA: Diagnosis not present

## 2017-08-15 DIAGNOSIS — N2589 Other disorders resulting from impaired renal tubular function: Secondary | ICD-10-CM | POA: Diagnosis not present

## 2017-08-15 DIAGNOSIS — Z79899 Other long term (current) drug therapy: Secondary | ICD-10-CM | POA: Diagnosis not present

## 2017-08-16 DIAGNOSIS — N186 End stage renal disease: Secondary | ICD-10-CM | POA: Diagnosis not present

## 2017-08-16 DIAGNOSIS — D631 Anemia in chronic kidney disease: Secondary | ICD-10-CM | POA: Diagnosis not present

## 2017-08-16 DIAGNOSIS — Z5189 Encounter for other specified aftercare: Secondary | ICD-10-CM | POA: Diagnosis not present

## 2017-08-16 DIAGNOSIS — N2589 Other disorders resulting from impaired renal tubular function: Secondary | ICD-10-CM | POA: Diagnosis not present

## 2017-08-16 DIAGNOSIS — Z79899 Other long term (current) drug therapy: Secondary | ICD-10-CM | POA: Diagnosis not present

## 2017-08-16 DIAGNOSIS — D509 Iron deficiency anemia, unspecified: Secondary | ICD-10-CM | POA: Diagnosis not present

## 2017-08-17 DIAGNOSIS — Z79899 Other long term (current) drug therapy: Secondary | ICD-10-CM | POA: Diagnosis not present

## 2017-08-17 DIAGNOSIS — N186 End stage renal disease: Secondary | ICD-10-CM | POA: Diagnosis not present

## 2017-08-17 DIAGNOSIS — D509 Iron deficiency anemia, unspecified: Secondary | ICD-10-CM | POA: Diagnosis not present

## 2017-08-17 DIAGNOSIS — N2589 Other disorders resulting from impaired renal tubular function: Secondary | ICD-10-CM | POA: Diagnosis not present

## 2017-08-17 DIAGNOSIS — D631 Anemia in chronic kidney disease: Secondary | ICD-10-CM | POA: Diagnosis not present

## 2017-08-17 DIAGNOSIS — Z5189 Encounter for other specified aftercare: Secondary | ICD-10-CM | POA: Diagnosis not present

## 2017-08-18 DIAGNOSIS — D509 Iron deficiency anemia, unspecified: Secondary | ICD-10-CM | POA: Diagnosis not present

## 2017-08-18 DIAGNOSIS — D631 Anemia in chronic kidney disease: Secondary | ICD-10-CM | POA: Diagnosis not present

## 2017-08-18 DIAGNOSIS — N186 End stage renal disease: Secondary | ICD-10-CM | POA: Diagnosis not present

## 2017-08-18 DIAGNOSIS — N2589 Other disorders resulting from impaired renal tubular function: Secondary | ICD-10-CM | POA: Diagnosis not present

## 2017-08-18 DIAGNOSIS — Z5189 Encounter for other specified aftercare: Secondary | ICD-10-CM | POA: Diagnosis not present

## 2017-08-18 DIAGNOSIS — Z79899 Other long term (current) drug therapy: Secondary | ICD-10-CM | POA: Diagnosis not present

## 2017-08-19 DIAGNOSIS — Z5189 Encounter for other specified aftercare: Secondary | ICD-10-CM | POA: Diagnosis not present

## 2017-08-19 DIAGNOSIS — D509 Iron deficiency anemia, unspecified: Secondary | ICD-10-CM | POA: Diagnosis not present

## 2017-08-19 DIAGNOSIS — D631 Anemia in chronic kidney disease: Secondary | ICD-10-CM | POA: Diagnosis not present

## 2017-08-19 DIAGNOSIS — Z79899 Other long term (current) drug therapy: Secondary | ICD-10-CM | POA: Diagnosis not present

## 2017-08-19 DIAGNOSIS — N186 End stage renal disease: Secondary | ICD-10-CM | POA: Diagnosis not present

## 2017-08-19 DIAGNOSIS — N2589 Other disorders resulting from impaired renal tubular function: Secondary | ICD-10-CM | POA: Diagnosis not present

## 2017-08-20 DIAGNOSIS — D509 Iron deficiency anemia, unspecified: Secondary | ICD-10-CM | POA: Diagnosis not present

## 2017-08-20 DIAGNOSIS — D631 Anemia in chronic kidney disease: Secondary | ICD-10-CM | POA: Diagnosis not present

## 2017-08-20 DIAGNOSIS — Z79899 Other long term (current) drug therapy: Secondary | ICD-10-CM | POA: Diagnosis not present

## 2017-08-20 DIAGNOSIS — N2589 Other disorders resulting from impaired renal tubular function: Secondary | ICD-10-CM | POA: Diagnosis not present

## 2017-08-20 DIAGNOSIS — N186 End stage renal disease: Secondary | ICD-10-CM | POA: Diagnosis not present

## 2017-08-20 DIAGNOSIS — Z5189 Encounter for other specified aftercare: Secondary | ICD-10-CM | POA: Diagnosis not present

## 2017-08-21 DIAGNOSIS — D631 Anemia in chronic kidney disease: Secondary | ICD-10-CM | POA: Diagnosis not present

## 2017-08-21 DIAGNOSIS — N2589 Other disorders resulting from impaired renal tubular function: Secondary | ICD-10-CM | POA: Diagnosis not present

## 2017-08-21 DIAGNOSIS — Z5189 Encounter for other specified aftercare: Secondary | ICD-10-CM | POA: Diagnosis not present

## 2017-08-21 DIAGNOSIS — D509 Iron deficiency anemia, unspecified: Secondary | ICD-10-CM | POA: Diagnosis not present

## 2017-08-21 DIAGNOSIS — Z79899 Other long term (current) drug therapy: Secondary | ICD-10-CM | POA: Diagnosis not present

## 2017-08-21 DIAGNOSIS — N186 End stage renal disease: Secondary | ICD-10-CM | POA: Diagnosis not present

## 2017-08-22 DIAGNOSIS — D631 Anemia in chronic kidney disease: Secondary | ICD-10-CM | POA: Diagnosis not present

## 2017-08-22 DIAGNOSIS — Z79899 Other long term (current) drug therapy: Secondary | ICD-10-CM | POA: Diagnosis not present

## 2017-08-22 DIAGNOSIS — N2589 Other disorders resulting from impaired renal tubular function: Secondary | ICD-10-CM | POA: Diagnosis not present

## 2017-08-22 DIAGNOSIS — Z5189 Encounter for other specified aftercare: Secondary | ICD-10-CM | POA: Diagnosis not present

## 2017-08-22 DIAGNOSIS — N186 End stage renal disease: Secondary | ICD-10-CM | POA: Diagnosis not present

## 2017-08-22 DIAGNOSIS — D509 Iron deficiency anemia, unspecified: Secondary | ICD-10-CM | POA: Diagnosis not present

## 2017-08-23 DIAGNOSIS — D631 Anemia in chronic kidney disease: Secondary | ICD-10-CM | POA: Diagnosis not present

## 2017-08-23 DIAGNOSIS — N2589 Other disorders resulting from impaired renal tubular function: Secondary | ICD-10-CM | POA: Diagnosis not present

## 2017-08-23 DIAGNOSIS — Z5189 Encounter for other specified aftercare: Secondary | ICD-10-CM | POA: Diagnosis not present

## 2017-08-23 DIAGNOSIS — Z79899 Other long term (current) drug therapy: Secondary | ICD-10-CM | POA: Diagnosis not present

## 2017-08-23 DIAGNOSIS — N186 End stage renal disease: Secondary | ICD-10-CM | POA: Diagnosis not present

## 2017-08-23 DIAGNOSIS — D509 Iron deficiency anemia, unspecified: Secondary | ICD-10-CM | POA: Diagnosis not present

## 2017-08-24 ENCOUNTER — Telehealth: Payer: Self-pay

## 2017-08-24 DIAGNOSIS — D509 Iron deficiency anemia, unspecified: Secondary | ICD-10-CM | POA: Diagnosis not present

## 2017-08-24 DIAGNOSIS — D631 Anemia in chronic kidney disease: Secondary | ICD-10-CM | POA: Diagnosis not present

## 2017-08-24 DIAGNOSIS — Z79899 Other long term (current) drug therapy: Secondary | ICD-10-CM | POA: Diagnosis not present

## 2017-08-24 DIAGNOSIS — Z5189 Encounter for other specified aftercare: Secondary | ICD-10-CM | POA: Diagnosis not present

## 2017-08-24 DIAGNOSIS — N2589 Other disorders resulting from impaired renal tubular function: Secondary | ICD-10-CM | POA: Diagnosis not present

## 2017-08-24 DIAGNOSIS — N186 End stage renal disease: Secondary | ICD-10-CM | POA: Diagnosis not present

## 2017-08-24 MED ORDER — LEVETIRACETAM 250 MG PO TABS: 250.0000 mg | ORAL_TABLET | Freq: Every day | ORAL | 0 refills | Status: DC

## 2017-08-24 NOTE — Telephone Encounter (Signed)
Patient called wanting a call back, was told on last visit (06-06-17) that:   Keppra 250 mg twice a day---continue for now but should be able to discontinue over the next few months as seizures were likely relatd to his extreme hyperglycemia. .  Called [place] wanting to know if it was ok to discontinue the medication or if he needs to continue then he needs a follow up, please advise

## 2017-08-24 NOTE — Telephone Encounter (Signed)
Continue keppra but decrease to daily until bottle is empty. Then he can stop

## 2017-08-25 DIAGNOSIS — N186 End stage renal disease: Secondary | ICD-10-CM | POA: Diagnosis not present

## 2017-08-25 DIAGNOSIS — D631 Anemia in chronic kidney disease: Secondary | ICD-10-CM | POA: Diagnosis not present

## 2017-08-25 DIAGNOSIS — N2589 Other disorders resulting from impaired renal tubular function: Secondary | ICD-10-CM | POA: Diagnosis not present

## 2017-08-25 DIAGNOSIS — D509 Iron deficiency anemia, unspecified: Secondary | ICD-10-CM | POA: Diagnosis not present

## 2017-08-25 DIAGNOSIS — Z5189 Encounter for other specified aftercare: Secondary | ICD-10-CM | POA: Diagnosis not present

## 2017-08-25 DIAGNOSIS — Z79899 Other long term (current) drug therapy: Secondary | ICD-10-CM | POA: Diagnosis not present

## 2017-08-26 DIAGNOSIS — N2589 Other disorders resulting from impaired renal tubular function: Secondary | ICD-10-CM | POA: Diagnosis not present

## 2017-08-26 DIAGNOSIS — N186 End stage renal disease: Secondary | ICD-10-CM | POA: Diagnosis not present

## 2017-08-26 DIAGNOSIS — I129 Hypertensive chronic kidney disease with stage 1 through stage 4 chronic kidney disease, or unspecified chronic kidney disease: Secondary | ICD-10-CM | POA: Diagnosis not present

## 2017-08-26 DIAGNOSIS — Z79899 Other long term (current) drug therapy: Secondary | ICD-10-CM | POA: Diagnosis not present

## 2017-08-26 DIAGNOSIS — Z5189 Encounter for other specified aftercare: Secondary | ICD-10-CM | POA: Diagnosis not present

## 2017-08-26 DIAGNOSIS — D631 Anemia in chronic kidney disease: Secondary | ICD-10-CM | POA: Diagnosis not present

## 2017-08-26 DIAGNOSIS — Z992 Dependence on renal dialysis: Secondary | ICD-10-CM | POA: Diagnosis not present

## 2017-08-26 DIAGNOSIS — D509 Iron deficiency anemia, unspecified: Secondary | ICD-10-CM | POA: Diagnosis not present

## 2017-08-27 DIAGNOSIS — D509 Iron deficiency anemia, unspecified: Secondary | ICD-10-CM | POA: Diagnosis not present

## 2017-08-27 DIAGNOSIS — N186 End stage renal disease: Secondary | ICD-10-CM | POA: Diagnosis not present

## 2017-08-27 DIAGNOSIS — N2589 Other disorders resulting from impaired renal tubular function: Secondary | ICD-10-CM | POA: Diagnosis not present

## 2017-08-27 DIAGNOSIS — N2581 Secondary hyperparathyroidism of renal origin: Secondary | ICD-10-CM | POA: Diagnosis not present

## 2017-08-27 DIAGNOSIS — Z4932 Encounter for adequacy testing for peritoneal dialysis: Secondary | ICD-10-CM | POA: Diagnosis not present

## 2017-08-27 DIAGNOSIS — D631 Anemia in chronic kidney disease: Secondary | ICD-10-CM | POA: Diagnosis not present

## 2017-08-28 DIAGNOSIS — D509 Iron deficiency anemia, unspecified: Secondary | ICD-10-CM | POA: Diagnosis not present

## 2017-08-28 DIAGNOSIS — Z4932 Encounter for adequacy testing for peritoneal dialysis: Secondary | ICD-10-CM | POA: Diagnosis not present

## 2017-08-28 DIAGNOSIS — N2589 Other disorders resulting from impaired renal tubular function: Secondary | ICD-10-CM | POA: Diagnosis not present

## 2017-08-28 DIAGNOSIS — N2581 Secondary hyperparathyroidism of renal origin: Secondary | ICD-10-CM | POA: Diagnosis not present

## 2017-08-28 DIAGNOSIS — N186 End stage renal disease: Secondary | ICD-10-CM | POA: Diagnosis not present

## 2017-08-28 DIAGNOSIS — D631 Anemia in chronic kidney disease: Secondary | ICD-10-CM | POA: Diagnosis not present

## 2017-08-29 DIAGNOSIS — N2589 Other disorders resulting from impaired renal tubular function: Secondary | ICD-10-CM | POA: Diagnosis not present

## 2017-08-29 DIAGNOSIS — D631 Anemia in chronic kidney disease: Secondary | ICD-10-CM | POA: Diagnosis not present

## 2017-08-29 DIAGNOSIS — Z4932 Encounter for adequacy testing for peritoneal dialysis: Secondary | ICD-10-CM | POA: Diagnosis not present

## 2017-08-29 DIAGNOSIS — N186 End stage renal disease: Secondary | ICD-10-CM | POA: Diagnosis not present

## 2017-08-29 DIAGNOSIS — D509 Iron deficiency anemia, unspecified: Secondary | ICD-10-CM | POA: Diagnosis not present

## 2017-08-29 DIAGNOSIS — N2581 Secondary hyperparathyroidism of renal origin: Secondary | ICD-10-CM | POA: Diagnosis not present

## 2017-08-30 DIAGNOSIS — E7849 Other hyperlipidemia: Secondary | ICD-10-CM | POA: Diagnosis not present

## 2017-08-30 DIAGNOSIS — N2581 Secondary hyperparathyroidism of renal origin: Secondary | ICD-10-CM | POA: Diagnosis not present

## 2017-08-30 DIAGNOSIS — D509 Iron deficiency anemia, unspecified: Secondary | ICD-10-CM | POA: Diagnosis not present

## 2017-08-30 DIAGNOSIS — N186 End stage renal disease: Secondary | ICD-10-CM | POA: Diagnosis not present

## 2017-08-30 DIAGNOSIS — E1129 Type 2 diabetes mellitus with other diabetic kidney complication: Secondary | ICD-10-CM | POA: Diagnosis not present

## 2017-08-30 DIAGNOSIS — Z4932 Encounter for adequacy testing for peritoneal dialysis: Secondary | ICD-10-CM | POA: Diagnosis not present

## 2017-08-30 DIAGNOSIS — D631 Anemia in chronic kidney disease: Secondary | ICD-10-CM | POA: Diagnosis not present

## 2017-08-30 DIAGNOSIS — N2589 Other disorders resulting from impaired renal tubular function: Secondary | ICD-10-CM | POA: Diagnosis not present

## 2017-08-31 DIAGNOSIS — Z4932 Encounter for adequacy testing for peritoneal dialysis: Secondary | ICD-10-CM | POA: Diagnosis not present

## 2017-08-31 DIAGNOSIS — N2581 Secondary hyperparathyroidism of renal origin: Secondary | ICD-10-CM | POA: Diagnosis not present

## 2017-08-31 DIAGNOSIS — D631 Anemia in chronic kidney disease: Secondary | ICD-10-CM | POA: Diagnosis not present

## 2017-08-31 DIAGNOSIS — D509 Iron deficiency anemia, unspecified: Secondary | ICD-10-CM | POA: Diagnosis not present

## 2017-08-31 DIAGNOSIS — N186 End stage renal disease: Secondary | ICD-10-CM | POA: Diagnosis not present

## 2017-08-31 DIAGNOSIS — N2589 Other disorders resulting from impaired renal tubular function: Secondary | ICD-10-CM | POA: Diagnosis not present

## 2017-09-01 DIAGNOSIS — D509 Iron deficiency anemia, unspecified: Secondary | ICD-10-CM | POA: Diagnosis not present

## 2017-09-01 DIAGNOSIS — D631 Anemia in chronic kidney disease: Secondary | ICD-10-CM | POA: Diagnosis not present

## 2017-09-01 DIAGNOSIS — N2589 Other disorders resulting from impaired renal tubular function: Secondary | ICD-10-CM | POA: Diagnosis not present

## 2017-09-01 DIAGNOSIS — Z4932 Encounter for adequacy testing for peritoneal dialysis: Secondary | ICD-10-CM | POA: Diagnosis not present

## 2017-09-01 DIAGNOSIS — N186 End stage renal disease: Secondary | ICD-10-CM | POA: Diagnosis not present

## 2017-09-01 DIAGNOSIS — N2581 Secondary hyperparathyroidism of renal origin: Secondary | ICD-10-CM | POA: Diagnosis not present

## 2017-09-02 DIAGNOSIS — D631 Anemia in chronic kidney disease: Secondary | ICD-10-CM | POA: Diagnosis not present

## 2017-09-02 DIAGNOSIS — N2581 Secondary hyperparathyroidism of renal origin: Secondary | ICD-10-CM | POA: Diagnosis not present

## 2017-09-02 DIAGNOSIS — N186 End stage renal disease: Secondary | ICD-10-CM | POA: Diagnosis not present

## 2017-09-02 DIAGNOSIS — N2589 Other disorders resulting from impaired renal tubular function: Secondary | ICD-10-CM | POA: Diagnosis not present

## 2017-09-02 DIAGNOSIS — D509 Iron deficiency anemia, unspecified: Secondary | ICD-10-CM | POA: Diagnosis not present

## 2017-09-02 DIAGNOSIS — Z4932 Encounter for adequacy testing for peritoneal dialysis: Secondary | ICD-10-CM | POA: Diagnosis not present

## 2017-09-03 DIAGNOSIS — N2589 Other disorders resulting from impaired renal tubular function: Secondary | ICD-10-CM | POA: Diagnosis not present

## 2017-09-03 DIAGNOSIS — Z4932 Encounter for adequacy testing for peritoneal dialysis: Secondary | ICD-10-CM | POA: Diagnosis not present

## 2017-09-03 DIAGNOSIS — D509 Iron deficiency anemia, unspecified: Secondary | ICD-10-CM | POA: Diagnosis not present

## 2017-09-03 DIAGNOSIS — D631 Anemia in chronic kidney disease: Secondary | ICD-10-CM | POA: Diagnosis not present

## 2017-09-03 DIAGNOSIS — N2581 Secondary hyperparathyroidism of renal origin: Secondary | ICD-10-CM | POA: Diagnosis not present

## 2017-09-03 DIAGNOSIS — N186 End stage renal disease: Secondary | ICD-10-CM | POA: Diagnosis not present

## 2017-09-04 DIAGNOSIS — N186 End stage renal disease: Secondary | ICD-10-CM | POA: Diagnosis not present

## 2017-09-04 DIAGNOSIS — D509 Iron deficiency anemia, unspecified: Secondary | ICD-10-CM | POA: Diagnosis not present

## 2017-09-04 DIAGNOSIS — D631 Anemia in chronic kidney disease: Secondary | ICD-10-CM | POA: Diagnosis not present

## 2017-09-04 DIAGNOSIS — Z4932 Encounter for adequacy testing for peritoneal dialysis: Secondary | ICD-10-CM | POA: Diagnosis not present

## 2017-09-04 DIAGNOSIS — N2581 Secondary hyperparathyroidism of renal origin: Secondary | ICD-10-CM | POA: Diagnosis not present

## 2017-09-04 DIAGNOSIS — N2589 Other disorders resulting from impaired renal tubular function: Secondary | ICD-10-CM | POA: Diagnosis not present

## 2017-09-05 DIAGNOSIS — Z4932 Encounter for adequacy testing for peritoneal dialysis: Secondary | ICD-10-CM | POA: Diagnosis not present

## 2017-09-05 DIAGNOSIS — D631 Anemia in chronic kidney disease: Secondary | ICD-10-CM | POA: Diagnosis not present

## 2017-09-05 DIAGNOSIS — N2589 Other disorders resulting from impaired renal tubular function: Secondary | ICD-10-CM | POA: Diagnosis not present

## 2017-09-05 DIAGNOSIS — N2581 Secondary hyperparathyroidism of renal origin: Secondary | ICD-10-CM | POA: Diagnosis not present

## 2017-09-05 DIAGNOSIS — N186 End stage renal disease: Secondary | ICD-10-CM | POA: Diagnosis not present

## 2017-09-05 DIAGNOSIS — D509 Iron deficiency anemia, unspecified: Secondary | ICD-10-CM | POA: Diagnosis not present

## 2017-09-06 DIAGNOSIS — D509 Iron deficiency anemia, unspecified: Secondary | ICD-10-CM | POA: Diagnosis not present

## 2017-09-06 DIAGNOSIS — N2581 Secondary hyperparathyroidism of renal origin: Secondary | ICD-10-CM | POA: Diagnosis not present

## 2017-09-06 DIAGNOSIS — N2589 Other disorders resulting from impaired renal tubular function: Secondary | ICD-10-CM | POA: Diagnosis not present

## 2017-09-06 DIAGNOSIS — D631 Anemia in chronic kidney disease: Secondary | ICD-10-CM | POA: Diagnosis not present

## 2017-09-06 DIAGNOSIS — Z4932 Encounter for adequacy testing for peritoneal dialysis: Secondary | ICD-10-CM | POA: Diagnosis not present

## 2017-09-06 DIAGNOSIS — N186 End stage renal disease: Secondary | ICD-10-CM | POA: Diagnosis not present

## 2017-09-07 DIAGNOSIS — Z4932 Encounter for adequacy testing for peritoneal dialysis: Secondary | ICD-10-CM | POA: Diagnosis not present

## 2017-09-07 DIAGNOSIS — D631 Anemia in chronic kidney disease: Secondary | ICD-10-CM | POA: Diagnosis not present

## 2017-09-07 DIAGNOSIS — N186 End stage renal disease: Secondary | ICD-10-CM | POA: Diagnosis not present

## 2017-09-07 DIAGNOSIS — N2589 Other disorders resulting from impaired renal tubular function: Secondary | ICD-10-CM | POA: Diagnosis not present

## 2017-09-07 DIAGNOSIS — D509 Iron deficiency anemia, unspecified: Secondary | ICD-10-CM | POA: Diagnosis not present

## 2017-09-07 DIAGNOSIS — N2581 Secondary hyperparathyroidism of renal origin: Secondary | ICD-10-CM | POA: Diagnosis not present

## 2017-09-08 DIAGNOSIS — Z4932 Encounter for adequacy testing for peritoneal dialysis: Secondary | ICD-10-CM | POA: Diagnosis not present

## 2017-09-08 DIAGNOSIS — D631 Anemia in chronic kidney disease: Secondary | ICD-10-CM | POA: Diagnosis not present

## 2017-09-08 DIAGNOSIS — D509 Iron deficiency anemia, unspecified: Secondary | ICD-10-CM | POA: Diagnosis not present

## 2017-09-08 DIAGNOSIS — N186 End stage renal disease: Secondary | ICD-10-CM | POA: Diagnosis not present

## 2017-09-08 DIAGNOSIS — N2581 Secondary hyperparathyroidism of renal origin: Secondary | ICD-10-CM | POA: Diagnosis not present

## 2017-09-08 DIAGNOSIS — N2589 Other disorders resulting from impaired renal tubular function: Secondary | ICD-10-CM | POA: Diagnosis not present

## 2017-09-09 DIAGNOSIS — N2589 Other disorders resulting from impaired renal tubular function: Secondary | ICD-10-CM | POA: Diagnosis not present

## 2017-09-09 DIAGNOSIS — D509 Iron deficiency anemia, unspecified: Secondary | ICD-10-CM | POA: Diagnosis not present

## 2017-09-09 DIAGNOSIS — D631 Anemia in chronic kidney disease: Secondary | ICD-10-CM | POA: Diagnosis not present

## 2017-09-09 DIAGNOSIS — N2581 Secondary hyperparathyroidism of renal origin: Secondary | ICD-10-CM | POA: Diagnosis not present

## 2017-09-09 DIAGNOSIS — N186 End stage renal disease: Secondary | ICD-10-CM | POA: Diagnosis not present

## 2017-09-09 DIAGNOSIS — Z4932 Encounter for adequacy testing for peritoneal dialysis: Secondary | ICD-10-CM | POA: Diagnosis not present

## 2017-09-10 DIAGNOSIS — N186 End stage renal disease: Secondary | ICD-10-CM | POA: Diagnosis not present

## 2017-09-10 DIAGNOSIS — Z4932 Encounter for adequacy testing for peritoneal dialysis: Secondary | ICD-10-CM | POA: Diagnosis not present

## 2017-09-10 DIAGNOSIS — N2581 Secondary hyperparathyroidism of renal origin: Secondary | ICD-10-CM | POA: Diagnosis not present

## 2017-09-10 DIAGNOSIS — N2589 Other disorders resulting from impaired renal tubular function: Secondary | ICD-10-CM | POA: Diagnosis not present

## 2017-09-10 DIAGNOSIS — D509 Iron deficiency anemia, unspecified: Secondary | ICD-10-CM | POA: Diagnosis not present

## 2017-09-10 DIAGNOSIS — D631 Anemia in chronic kidney disease: Secondary | ICD-10-CM | POA: Diagnosis not present

## 2017-09-11 DIAGNOSIS — N186 End stage renal disease: Secondary | ICD-10-CM | POA: Diagnosis not present

## 2017-09-11 DIAGNOSIS — N2581 Secondary hyperparathyroidism of renal origin: Secondary | ICD-10-CM | POA: Diagnosis not present

## 2017-09-11 DIAGNOSIS — N2589 Other disorders resulting from impaired renal tubular function: Secondary | ICD-10-CM | POA: Diagnosis not present

## 2017-09-11 DIAGNOSIS — D509 Iron deficiency anemia, unspecified: Secondary | ICD-10-CM | POA: Diagnosis not present

## 2017-09-11 DIAGNOSIS — Z4932 Encounter for adequacy testing for peritoneal dialysis: Secondary | ICD-10-CM | POA: Diagnosis not present

## 2017-09-11 DIAGNOSIS — D631 Anemia in chronic kidney disease: Secondary | ICD-10-CM | POA: Diagnosis not present

## 2017-09-12 DIAGNOSIS — Z4932 Encounter for adequacy testing for peritoneal dialysis: Secondary | ICD-10-CM | POA: Diagnosis not present

## 2017-09-12 DIAGNOSIS — D509 Iron deficiency anemia, unspecified: Secondary | ICD-10-CM | POA: Diagnosis not present

## 2017-09-12 DIAGNOSIS — N2581 Secondary hyperparathyroidism of renal origin: Secondary | ICD-10-CM | POA: Diagnosis not present

## 2017-09-12 DIAGNOSIS — N186 End stage renal disease: Secondary | ICD-10-CM | POA: Diagnosis not present

## 2017-09-12 DIAGNOSIS — D631 Anemia in chronic kidney disease: Secondary | ICD-10-CM | POA: Diagnosis not present

## 2017-09-12 DIAGNOSIS — N2589 Other disorders resulting from impaired renal tubular function: Secondary | ICD-10-CM | POA: Diagnosis not present

## 2017-09-13 DIAGNOSIS — Z4932 Encounter for adequacy testing for peritoneal dialysis: Secondary | ICD-10-CM | POA: Diagnosis not present

## 2017-09-13 DIAGNOSIS — D631 Anemia in chronic kidney disease: Secondary | ICD-10-CM | POA: Diagnosis not present

## 2017-09-13 DIAGNOSIS — N2581 Secondary hyperparathyroidism of renal origin: Secondary | ICD-10-CM | POA: Diagnosis not present

## 2017-09-13 DIAGNOSIS — N2589 Other disorders resulting from impaired renal tubular function: Secondary | ICD-10-CM | POA: Diagnosis not present

## 2017-09-13 DIAGNOSIS — N186 End stage renal disease: Secondary | ICD-10-CM | POA: Diagnosis not present

## 2017-09-13 DIAGNOSIS — D509 Iron deficiency anemia, unspecified: Secondary | ICD-10-CM | POA: Diagnosis not present

## 2017-09-14 DIAGNOSIS — D509 Iron deficiency anemia, unspecified: Secondary | ICD-10-CM | POA: Diagnosis not present

## 2017-09-14 DIAGNOSIS — N186 End stage renal disease: Secondary | ICD-10-CM | POA: Diagnosis not present

## 2017-09-14 DIAGNOSIS — Z4932 Encounter for adequacy testing for peritoneal dialysis: Secondary | ICD-10-CM | POA: Diagnosis not present

## 2017-09-14 DIAGNOSIS — D631 Anemia in chronic kidney disease: Secondary | ICD-10-CM | POA: Diagnosis not present

## 2017-09-14 DIAGNOSIS — N2581 Secondary hyperparathyroidism of renal origin: Secondary | ICD-10-CM | POA: Diagnosis not present

## 2017-09-14 DIAGNOSIS — N2589 Other disorders resulting from impaired renal tubular function: Secondary | ICD-10-CM | POA: Diagnosis not present

## 2017-09-15 DIAGNOSIS — D509 Iron deficiency anemia, unspecified: Secondary | ICD-10-CM | POA: Diagnosis not present

## 2017-09-15 DIAGNOSIS — D631 Anemia in chronic kidney disease: Secondary | ICD-10-CM | POA: Diagnosis not present

## 2017-09-15 DIAGNOSIS — Z4932 Encounter for adequacy testing for peritoneal dialysis: Secondary | ICD-10-CM | POA: Diagnosis not present

## 2017-09-15 DIAGNOSIS — N2589 Other disorders resulting from impaired renal tubular function: Secondary | ICD-10-CM | POA: Diagnosis not present

## 2017-09-15 DIAGNOSIS — N2581 Secondary hyperparathyroidism of renal origin: Secondary | ICD-10-CM | POA: Diagnosis not present

## 2017-09-15 DIAGNOSIS — N186 End stage renal disease: Secondary | ICD-10-CM | POA: Diagnosis not present

## 2017-09-16 DIAGNOSIS — N186 End stage renal disease: Secondary | ICD-10-CM | POA: Diagnosis not present

## 2017-09-16 DIAGNOSIS — N2589 Other disorders resulting from impaired renal tubular function: Secondary | ICD-10-CM | POA: Diagnosis not present

## 2017-09-16 DIAGNOSIS — D631 Anemia in chronic kidney disease: Secondary | ICD-10-CM | POA: Diagnosis not present

## 2017-09-16 DIAGNOSIS — N2581 Secondary hyperparathyroidism of renal origin: Secondary | ICD-10-CM | POA: Diagnosis not present

## 2017-09-16 DIAGNOSIS — D509 Iron deficiency anemia, unspecified: Secondary | ICD-10-CM | POA: Diagnosis not present

## 2017-09-16 DIAGNOSIS — Z4932 Encounter for adequacy testing for peritoneal dialysis: Secondary | ICD-10-CM | POA: Diagnosis not present

## 2017-09-17 DIAGNOSIS — Z4932 Encounter for adequacy testing for peritoneal dialysis: Secondary | ICD-10-CM | POA: Diagnosis not present

## 2017-09-17 DIAGNOSIS — D509 Iron deficiency anemia, unspecified: Secondary | ICD-10-CM | POA: Diagnosis not present

## 2017-09-17 DIAGNOSIS — N186 End stage renal disease: Secondary | ICD-10-CM | POA: Diagnosis not present

## 2017-09-17 DIAGNOSIS — D631 Anemia in chronic kidney disease: Secondary | ICD-10-CM | POA: Diagnosis not present

## 2017-09-17 DIAGNOSIS — N2589 Other disorders resulting from impaired renal tubular function: Secondary | ICD-10-CM | POA: Diagnosis not present

## 2017-09-17 DIAGNOSIS — N2581 Secondary hyperparathyroidism of renal origin: Secondary | ICD-10-CM | POA: Diagnosis not present

## 2017-09-18 DIAGNOSIS — N2581 Secondary hyperparathyroidism of renal origin: Secondary | ICD-10-CM | POA: Diagnosis not present

## 2017-09-18 DIAGNOSIS — N186 End stage renal disease: Secondary | ICD-10-CM | POA: Diagnosis not present

## 2017-09-18 DIAGNOSIS — N2589 Other disorders resulting from impaired renal tubular function: Secondary | ICD-10-CM | POA: Diagnosis not present

## 2017-09-18 DIAGNOSIS — D509 Iron deficiency anemia, unspecified: Secondary | ICD-10-CM | POA: Diagnosis not present

## 2017-09-18 DIAGNOSIS — Z4932 Encounter for adequacy testing for peritoneal dialysis: Secondary | ICD-10-CM | POA: Diagnosis not present

## 2017-09-18 DIAGNOSIS — D631 Anemia in chronic kidney disease: Secondary | ICD-10-CM | POA: Diagnosis not present

## 2017-09-19 DIAGNOSIS — D631 Anemia in chronic kidney disease: Secondary | ICD-10-CM | POA: Diagnosis not present

## 2017-09-19 DIAGNOSIS — D509 Iron deficiency anemia, unspecified: Secondary | ICD-10-CM | POA: Diagnosis not present

## 2017-09-19 DIAGNOSIS — N2581 Secondary hyperparathyroidism of renal origin: Secondary | ICD-10-CM | POA: Diagnosis not present

## 2017-09-19 DIAGNOSIS — Z4932 Encounter for adequacy testing for peritoneal dialysis: Secondary | ICD-10-CM | POA: Diagnosis not present

## 2017-09-19 DIAGNOSIS — N186 End stage renal disease: Secondary | ICD-10-CM | POA: Diagnosis not present

## 2017-09-19 DIAGNOSIS — N2589 Other disorders resulting from impaired renal tubular function: Secondary | ICD-10-CM | POA: Diagnosis not present

## 2017-09-20 DIAGNOSIS — D631 Anemia in chronic kidney disease: Secondary | ICD-10-CM | POA: Diagnosis not present

## 2017-09-20 DIAGNOSIS — N2581 Secondary hyperparathyroidism of renal origin: Secondary | ICD-10-CM | POA: Diagnosis not present

## 2017-09-20 DIAGNOSIS — D509 Iron deficiency anemia, unspecified: Secondary | ICD-10-CM | POA: Diagnosis not present

## 2017-09-20 DIAGNOSIS — Z4932 Encounter for adequacy testing for peritoneal dialysis: Secondary | ICD-10-CM | POA: Diagnosis not present

## 2017-09-20 DIAGNOSIS — N2589 Other disorders resulting from impaired renal tubular function: Secondary | ICD-10-CM | POA: Diagnosis not present

## 2017-09-20 DIAGNOSIS — N186 End stage renal disease: Secondary | ICD-10-CM | POA: Diagnosis not present

## 2017-09-21 DIAGNOSIS — D509 Iron deficiency anemia, unspecified: Secondary | ICD-10-CM | POA: Diagnosis not present

## 2017-09-21 DIAGNOSIS — N186 End stage renal disease: Secondary | ICD-10-CM | POA: Diagnosis not present

## 2017-09-21 DIAGNOSIS — D631 Anemia in chronic kidney disease: Secondary | ICD-10-CM | POA: Diagnosis not present

## 2017-09-21 DIAGNOSIS — N2589 Other disorders resulting from impaired renal tubular function: Secondary | ICD-10-CM | POA: Diagnosis not present

## 2017-09-21 DIAGNOSIS — Z4932 Encounter for adequacy testing for peritoneal dialysis: Secondary | ICD-10-CM | POA: Diagnosis not present

## 2017-09-21 DIAGNOSIS — N2581 Secondary hyperparathyroidism of renal origin: Secondary | ICD-10-CM | POA: Diagnosis not present

## 2017-09-22 DIAGNOSIS — N2589 Other disorders resulting from impaired renal tubular function: Secondary | ICD-10-CM | POA: Diagnosis not present

## 2017-09-22 DIAGNOSIS — D631 Anemia in chronic kidney disease: Secondary | ICD-10-CM | POA: Diagnosis not present

## 2017-09-22 DIAGNOSIS — D509 Iron deficiency anemia, unspecified: Secondary | ICD-10-CM | POA: Diagnosis not present

## 2017-09-22 DIAGNOSIS — Z4932 Encounter for adequacy testing for peritoneal dialysis: Secondary | ICD-10-CM | POA: Diagnosis not present

## 2017-09-22 DIAGNOSIS — N186 End stage renal disease: Secondary | ICD-10-CM | POA: Diagnosis not present

## 2017-09-22 DIAGNOSIS — N2581 Secondary hyperparathyroidism of renal origin: Secondary | ICD-10-CM | POA: Diagnosis not present

## 2017-09-23 DIAGNOSIS — N2589 Other disorders resulting from impaired renal tubular function: Secondary | ICD-10-CM | POA: Diagnosis not present

## 2017-09-23 DIAGNOSIS — Z4932 Encounter for adequacy testing for peritoneal dialysis: Secondary | ICD-10-CM | POA: Diagnosis not present

## 2017-09-23 DIAGNOSIS — N2581 Secondary hyperparathyroidism of renal origin: Secondary | ICD-10-CM | POA: Diagnosis not present

## 2017-09-23 DIAGNOSIS — N186 End stage renal disease: Secondary | ICD-10-CM | POA: Diagnosis not present

## 2017-09-23 DIAGNOSIS — D631 Anemia in chronic kidney disease: Secondary | ICD-10-CM | POA: Diagnosis not present

## 2017-09-23 DIAGNOSIS — D509 Iron deficiency anemia, unspecified: Secondary | ICD-10-CM | POA: Diagnosis not present

## 2017-09-24 DIAGNOSIS — N2589 Other disorders resulting from impaired renal tubular function: Secondary | ICD-10-CM | POA: Diagnosis not present

## 2017-09-24 DIAGNOSIS — N186 End stage renal disease: Secondary | ICD-10-CM | POA: Diagnosis not present

## 2017-09-24 DIAGNOSIS — N2581 Secondary hyperparathyroidism of renal origin: Secondary | ICD-10-CM | POA: Diagnosis not present

## 2017-09-24 DIAGNOSIS — D631 Anemia in chronic kidney disease: Secondary | ICD-10-CM | POA: Diagnosis not present

## 2017-09-24 DIAGNOSIS — D509 Iron deficiency anemia, unspecified: Secondary | ICD-10-CM | POA: Diagnosis not present

## 2017-09-24 DIAGNOSIS — Z4932 Encounter for adequacy testing for peritoneal dialysis: Secondary | ICD-10-CM | POA: Diagnosis not present

## 2017-09-25 DIAGNOSIS — D509 Iron deficiency anemia, unspecified: Secondary | ICD-10-CM | POA: Diagnosis not present

## 2017-09-25 DIAGNOSIS — D631 Anemia in chronic kidney disease: Secondary | ICD-10-CM | POA: Diagnosis not present

## 2017-09-25 DIAGNOSIS — N2581 Secondary hyperparathyroidism of renal origin: Secondary | ICD-10-CM | POA: Diagnosis not present

## 2017-09-25 DIAGNOSIS — Z4932 Encounter for adequacy testing for peritoneal dialysis: Secondary | ICD-10-CM | POA: Diagnosis not present

## 2017-09-25 DIAGNOSIS — N186 End stage renal disease: Secondary | ICD-10-CM | POA: Diagnosis not present

## 2017-09-25 DIAGNOSIS — N2589 Other disorders resulting from impaired renal tubular function: Secondary | ICD-10-CM | POA: Diagnosis not present

## 2017-09-26 DIAGNOSIS — N2589 Other disorders resulting from impaired renal tubular function: Secondary | ICD-10-CM | POA: Diagnosis not present

## 2017-09-26 DIAGNOSIS — N186 End stage renal disease: Secondary | ICD-10-CM | POA: Diagnosis not present

## 2017-09-26 DIAGNOSIS — Z4932 Encounter for adequacy testing for peritoneal dialysis: Secondary | ICD-10-CM | POA: Diagnosis not present

## 2017-09-26 DIAGNOSIS — I129 Hypertensive chronic kidney disease with stage 1 through stage 4 chronic kidney disease, or unspecified chronic kidney disease: Secondary | ICD-10-CM | POA: Diagnosis not present

## 2017-09-26 DIAGNOSIS — D509 Iron deficiency anemia, unspecified: Secondary | ICD-10-CM | POA: Diagnosis not present

## 2017-09-26 DIAGNOSIS — N2581 Secondary hyperparathyroidism of renal origin: Secondary | ICD-10-CM | POA: Diagnosis not present

## 2017-09-26 DIAGNOSIS — Z992 Dependence on renal dialysis: Secondary | ICD-10-CM | POA: Diagnosis not present

## 2017-09-26 DIAGNOSIS — D631 Anemia in chronic kidney disease: Secondary | ICD-10-CM | POA: Diagnosis not present

## 2017-09-27 DIAGNOSIS — D631 Anemia in chronic kidney disease: Secondary | ICD-10-CM | POA: Diagnosis not present

## 2017-09-27 DIAGNOSIS — D689 Coagulation defect, unspecified: Secondary | ICD-10-CM | POA: Diagnosis not present

## 2017-09-27 DIAGNOSIS — Z79899 Other long term (current) drug therapy: Secondary | ICD-10-CM | POA: Diagnosis not present

## 2017-09-27 DIAGNOSIS — Z5189 Encounter for other specified aftercare: Secondary | ICD-10-CM | POA: Diagnosis not present

## 2017-09-27 DIAGNOSIS — N186 End stage renal disease: Secondary | ICD-10-CM | POA: Diagnosis not present

## 2017-09-27 DIAGNOSIS — N2581 Secondary hyperparathyroidism of renal origin: Secondary | ICD-10-CM | POA: Diagnosis not present

## 2017-09-27 DIAGNOSIS — E440 Moderate protein-calorie malnutrition: Secondary | ICD-10-CM | POA: Diagnosis not present

## 2017-09-27 DIAGNOSIS — D509 Iron deficiency anemia, unspecified: Secondary | ICD-10-CM | POA: Diagnosis not present

## 2017-09-28 DIAGNOSIS — D631 Anemia in chronic kidney disease: Secondary | ICD-10-CM | POA: Diagnosis not present

## 2017-09-28 DIAGNOSIS — D689 Coagulation defect, unspecified: Secondary | ICD-10-CM | POA: Diagnosis not present

## 2017-09-28 DIAGNOSIS — D509 Iron deficiency anemia, unspecified: Secondary | ICD-10-CM | POA: Diagnosis not present

## 2017-09-28 DIAGNOSIS — Z5189 Encounter for other specified aftercare: Secondary | ICD-10-CM | POA: Diagnosis not present

## 2017-09-28 DIAGNOSIS — N186 End stage renal disease: Secondary | ICD-10-CM | POA: Diagnosis not present

## 2017-09-28 DIAGNOSIS — N2581 Secondary hyperparathyroidism of renal origin: Secondary | ICD-10-CM | POA: Diagnosis not present

## 2017-09-29 DIAGNOSIS — N186 End stage renal disease: Secondary | ICD-10-CM | POA: Diagnosis not present

## 2017-09-29 DIAGNOSIS — D509 Iron deficiency anemia, unspecified: Secondary | ICD-10-CM | POA: Diagnosis not present

## 2017-09-29 DIAGNOSIS — N2581 Secondary hyperparathyroidism of renal origin: Secondary | ICD-10-CM | POA: Diagnosis not present

## 2017-09-29 DIAGNOSIS — Z5189 Encounter for other specified aftercare: Secondary | ICD-10-CM | POA: Diagnosis not present

## 2017-09-29 DIAGNOSIS — D689 Coagulation defect, unspecified: Secondary | ICD-10-CM | POA: Diagnosis not present

## 2017-09-29 DIAGNOSIS — D631 Anemia in chronic kidney disease: Secondary | ICD-10-CM | POA: Diagnosis not present

## 2017-09-30 DIAGNOSIS — N2581 Secondary hyperparathyroidism of renal origin: Secondary | ICD-10-CM | POA: Diagnosis not present

## 2017-09-30 DIAGNOSIS — D509 Iron deficiency anemia, unspecified: Secondary | ICD-10-CM | POA: Diagnosis not present

## 2017-09-30 DIAGNOSIS — D631 Anemia in chronic kidney disease: Secondary | ICD-10-CM | POA: Diagnosis not present

## 2017-09-30 DIAGNOSIS — D689 Coagulation defect, unspecified: Secondary | ICD-10-CM | POA: Diagnosis not present

## 2017-09-30 DIAGNOSIS — Z5189 Encounter for other specified aftercare: Secondary | ICD-10-CM | POA: Diagnosis not present

## 2017-09-30 DIAGNOSIS — N186 End stage renal disease: Secondary | ICD-10-CM | POA: Diagnosis not present

## 2017-10-01 ENCOUNTER — Telehealth: Payer: Self-pay | Admitting: Internal Medicine

## 2017-10-01 DIAGNOSIS — N186 End stage renal disease: Secondary | ICD-10-CM | POA: Diagnosis not present

## 2017-10-01 DIAGNOSIS — D509 Iron deficiency anemia, unspecified: Secondary | ICD-10-CM | POA: Diagnosis not present

## 2017-10-01 DIAGNOSIS — D689 Coagulation defect, unspecified: Secondary | ICD-10-CM | POA: Diagnosis not present

## 2017-10-01 DIAGNOSIS — N2581 Secondary hyperparathyroidism of renal origin: Secondary | ICD-10-CM | POA: Diagnosis not present

## 2017-10-01 DIAGNOSIS — D631 Anemia in chronic kidney disease: Secondary | ICD-10-CM | POA: Diagnosis not present

## 2017-10-01 DIAGNOSIS — Z5189 Encounter for other specified aftercare: Secondary | ICD-10-CM | POA: Diagnosis not present

## 2017-10-01 NOTE — Telephone Encounter (Signed)
Got a notice from cologuard that pt has not returned the test to get this completed.  I tried to call pt to inquire about this but no answer. Will try to call again later

## 2017-10-02 DIAGNOSIS — D631 Anemia in chronic kidney disease: Secondary | ICD-10-CM | POA: Diagnosis not present

## 2017-10-02 DIAGNOSIS — R82998 Other abnormal findings in urine: Secondary | ICD-10-CM | POA: Diagnosis not present

## 2017-10-02 DIAGNOSIS — N186 End stage renal disease: Secondary | ICD-10-CM | POA: Diagnosis not present

## 2017-10-02 DIAGNOSIS — D689 Coagulation defect, unspecified: Secondary | ICD-10-CM | POA: Diagnosis not present

## 2017-10-02 DIAGNOSIS — N2581 Secondary hyperparathyroidism of renal origin: Secondary | ICD-10-CM | POA: Diagnosis not present

## 2017-10-02 DIAGNOSIS — D509 Iron deficiency anemia, unspecified: Secondary | ICD-10-CM | POA: Diagnosis not present

## 2017-10-02 DIAGNOSIS — Z5189 Encounter for other specified aftercare: Secondary | ICD-10-CM | POA: Diagnosis not present

## 2017-10-02 NOTE — Telephone Encounter (Signed)
Tried to call patient again but no answer

## 2017-10-03 DIAGNOSIS — Z5189 Encounter for other specified aftercare: Secondary | ICD-10-CM | POA: Diagnosis not present

## 2017-10-03 DIAGNOSIS — D509 Iron deficiency anemia, unspecified: Secondary | ICD-10-CM | POA: Diagnosis not present

## 2017-10-03 DIAGNOSIS — N2581 Secondary hyperparathyroidism of renal origin: Secondary | ICD-10-CM | POA: Diagnosis not present

## 2017-10-03 DIAGNOSIS — D631 Anemia in chronic kidney disease: Secondary | ICD-10-CM | POA: Diagnosis not present

## 2017-10-03 DIAGNOSIS — N186 End stage renal disease: Secondary | ICD-10-CM | POA: Diagnosis not present

## 2017-10-03 DIAGNOSIS — D689 Coagulation defect, unspecified: Secondary | ICD-10-CM | POA: Diagnosis not present

## 2017-10-03 NOTE — Telephone Encounter (Signed)
Called and spoke to patient and notified him that we received noticed about him not returning it. He said he was going to think about doing if it was really necessary. I advised him since he refused a colonoscopy then that cologuard was the best option for him. He was probably going to do it and send it in sometime

## 2017-10-04 DIAGNOSIS — D689 Coagulation defect, unspecified: Secondary | ICD-10-CM | POA: Diagnosis not present

## 2017-10-04 DIAGNOSIS — N186 End stage renal disease: Secondary | ICD-10-CM | POA: Diagnosis not present

## 2017-10-04 DIAGNOSIS — D631 Anemia in chronic kidney disease: Secondary | ICD-10-CM | POA: Diagnosis not present

## 2017-10-04 DIAGNOSIS — D509 Iron deficiency anemia, unspecified: Secondary | ICD-10-CM | POA: Diagnosis not present

## 2017-10-04 DIAGNOSIS — Z5189 Encounter for other specified aftercare: Secondary | ICD-10-CM | POA: Diagnosis not present

## 2017-10-04 DIAGNOSIS — N2581 Secondary hyperparathyroidism of renal origin: Secondary | ICD-10-CM | POA: Diagnosis not present

## 2017-10-05 DIAGNOSIS — D689 Coagulation defect, unspecified: Secondary | ICD-10-CM | POA: Diagnosis not present

## 2017-10-05 DIAGNOSIS — D509 Iron deficiency anemia, unspecified: Secondary | ICD-10-CM | POA: Diagnosis not present

## 2017-10-05 DIAGNOSIS — N2581 Secondary hyperparathyroidism of renal origin: Secondary | ICD-10-CM | POA: Diagnosis not present

## 2017-10-05 DIAGNOSIS — N186 End stage renal disease: Secondary | ICD-10-CM | POA: Diagnosis not present

## 2017-10-05 DIAGNOSIS — Z5189 Encounter for other specified aftercare: Secondary | ICD-10-CM | POA: Diagnosis not present

## 2017-10-05 DIAGNOSIS — D631 Anemia in chronic kidney disease: Secondary | ICD-10-CM | POA: Diagnosis not present

## 2017-10-06 DIAGNOSIS — Z5189 Encounter for other specified aftercare: Secondary | ICD-10-CM | POA: Diagnosis not present

## 2017-10-06 DIAGNOSIS — N186 End stage renal disease: Secondary | ICD-10-CM | POA: Diagnosis not present

## 2017-10-06 DIAGNOSIS — D631 Anemia in chronic kidney disease: Secondary | ICD-10-CM | POA: Diagnosis not present

## 2017-10-06 DIAGNOSIS — D689 Coagulation defect, unspecified: Secondary | ICD-10-CM | POA: Diagnosis not present

## 2017-10-06 DIAGNOSIS — D509 Iron deficiency anemia, unspecified: Secondary | ICD-10-CM | POA: Diagnosis not present

## 2017-10-06 DIAGNOSIS — N2581 Secondary hyperparathyroidism of renal origin: Secondary | ICD-10-CM | POA: Diagnosis not present

## 2017-10-07 DIAGNOSIS — N186 End stage renal disease: Secondary | ICD-10-CM | POA: Diagnosis not present

## 2017-10-07 DIAGNOSIS — D631 Anemia in chronic kidney disease: Secondary | ICD-10-CM | POA: Diagnosis not present

## 2017-10-07 DIAGNOSIS — Z5189 Encounter for other specified aftercare: Secondary | ICD-10-CM | POA: Diagnosis not present

## 2017-10-07 DIAGNOSIS — D689 Coagulation defect, unspecified: Secondary | ICD-10-CM | POA: Diagnosis not present

## 2017-10-07 DIAGNOSIS — D509 Iron deficiency anemia, unspecified: Secondary | ICD-10-CM | POA: Diagnosis not present

## 2017-10-07 DIAGNOSIS — N2581 Secondary hyperparathyroidism of renal origin: Secondary | ICD-10-CM | POA: Diagnosis not present

## 2017-10-08 DIAGNOSIS — N186 End stage renal disease: Secondary | ICD-10-CM | POA: Diagnosis not present

## 2017-10-08 DIAGNOSIS — D631 Anemia in chronic kidney disease: Secondary | ICD-10-CM | POA: Diagnosis not present

## 2017-10-08 DIAGNOSIS — N2581 Secondary hyperparathyroidism of renal origin: Secondary | ICD-10-CM | POA: Diagnosis not present

## 2017-10-08 DIAGNOSIS — D689 Coagulation defect, unspecified: Secondary | ICD-10-CM | POA: Diagnosis not present

## 2017-10-08 DIAGNOSIS — Z5189 Encounter for other specified aftercare: Secondary | ICD-10-CM | POA: Diagnosis not present

## 2017-10-08 DIAGNOSIS — D509 Iron deficiency anemia, unspecified: Secondary | ICD-10-CM | POA: Diagnosis not present

## 2017-10-09 DIAGNOSIS — N186 End stage renal disease: Secondary | ICD-10-CM | POA: Diagnosis not present

## 2017-10-09 DIAGNOSIS — N2581 Secondary hyperparathyroidism of renal origin: Secondary | ICD-10-CM | POA: Diagnosis not present

## 2017-10-09 DIAGNOSIS — D509 Iron deficiency anemia, unspecified: Secondary | ICD-10-CM | POA: Diagnosis not present

## 2017-10-09 DIAGNOSIS — D689 Coagulation defect, unspecified: Secondary | ICD-10-CM | POA: Diagnosis not present

## 2017-10-09 DIAGNOSIS — Z5189 Encounter for other specified aftercare: Secondary | ICD-10-CM | POA: Diagnosis not present

## 2017-10-09 DIAGNOSIS — D631 Anemia in chronic kidney disease: Secondary | ICD-10-CM | POA: Diagnosis not present

## 2017-10-10 DIAGNOSIS — N2581 Secondary hyperparathyroidism of renal origin: Secondary | ICD-10-CM | POA: Diagnosis not present

## 2017-10-10 DIAGNOSIS — D509 Iron deficiency anemia, unspecified: Secondary | ICD-10-CM | POA: Diagnosis not present

## 2017-10-10 DIAGNOSIS — N186 End stage renal disease: Secondary | ICD-10-CM | POA: Diagnosis not present

## 2017-10-10 DIAGNOSIS — Z5189 Encounter for other specified aftercare: Secondary | ICD-10-CM | POA: Diagnosis not present

## 2017-10-10 DIAGNOSIS — D631 Anemia in chronic kidney disease: Secondary | ICD-10-CM | POA: Diagnosis not present

## 2017-10-10 DIAGNOSIS — D689 Coagulation defect, unspecified: Secondary | ICD-10-CM | POA: Diagnosis not present

## 2017-10-11 DIAGNOSIS — D689 Coagulation defect, unspecified: Secondary | ICD-10-CM | POA: Diagnosis not present

## 2017-10-11 DIAGNOSIS — N2581 Secondary hyperparathyroidism of renal origin: Secondary | ICD-10-CM | POA: Diagnosis not present

## 2017-10-11 DIAGNOSIS — D509 Iron deficiency anemia, unspecified: Secondary | ICD-10-CM | POA: Diagnosis not present

## 2017-10-11 DIAGNOSIS — N186 End stage renal disease: Secondary | ICD-10-CM | POA: Diagnosis not present

## 2017-10-11 DIAGNOSIS — D631 Anemia in chronic kidney disease: Secondary | ICD-10-CM | POA: Diagnosis not present

## 2017-10-11 DIAGNOSIS — Z5189 Encounter for other specified aftercare: Secondary | ICD-10-CM | POA: Diagnosis not present

## 2017-10-12 DIAGNOSIS — N2581 Secondary hyperparathyroidism of renal origin: Secondary | ICD-10-CM | POA: Diagnosis not present

## 2017-10-12 DIAGNOSIS — D509 Iron deficiency anemia, unspecified: Secondary | ICD-10-CM | POA: Diagnosis not present

## 2017-10-12 DIAGNOSIS — D631 Anemia in chronic kidney disease: Secondary | ICD-10-CM | POA: Diagnosis not present

## 2017-10-12 DIAGNOSIS — Z5189 Encounter for other specified aftercare: Secondary | ICD-10-CM | POA: Diagnosis not present

## 2017-10-12 DIAGNOSIS — N186 End stage renal disease: Secondary | ICD-10-CM | POA: Diagnosis not present

## 2017-10-12 DIAGNOSIS — D689 Coagulation defect, unspecified: Secondary | ICD-10-CM | POA: Diagnosis not present

## 2017-10-13 DIAGNOSIS — D689 Coagulation defect, unspecified: Secondary | ICD-10-CM | POA: Diagnosis not present

## 2017-10-13 DIAGNOSIS — Z5189 Encounter for other specified aftercare: Secondary | ICD-10-CM | POA: Diagnosis not present

## 2017-10-13 DIAGNOSIS — D631 Anemia in chronic kidney disease: Secondary | ICD-10-CM | POA: Diagnosis not present

## 2017-10-13 DIAGNOSIS — D509 Iron deficiency anemia, unspecified: Secondary | ICD-10-CM | POA: Diagnosis not present

## 2017-10-13 DIAGNOSIS — N2581 Secondary hyperparathyroidism of renal origin: Secondary | ICD-10-CM | POA: Diagnosis not present

## 2017-10-13 DIAGNOSIS — N186 End stage renal disease: Secondary | ICD-10-CM | POA: Diagnosis not present

## 2017-10-14 DIAGNOSIS — D689 Coagulation defect, unspecified: Secondary | ICD-10-CM | POA: Diagnosis not present

## 2017-10-14 DIAGNOSIS — N186 End stage renal disease: Secondary | ICD-10-CM | POA: Diagnosis not present

## 2017-10-14 DIAGNOSIS — D509 Iron deficiency anemia, unspecified: Secondary | ICD-10-CM | POA: Diagnosis not present

## 2017-10-14 DIAGNOSIS — Z5189 Encounter for other specified aftercare: Secondary | ICD-10-CM | POA: Diagnosis not present

## 2017-10-14 DIAGNOSIS — N2581 Secondary hyperparathyroidism of renal origin: Secondary | ICD-10-CM | POA: Diagnosis not present

## 2017-10-14 DIAGNOSIS — D631 Anemia in chronic kidney disease: Secondary | ICD-10-CM | POA: Diagnosis not present

## 2017-10-15 DIAGNOSIS — D631 Anemia in chronic kidney disease: Secondary | ICD-10-CM | POA: Diagnosis not present

## 2017-10-15 DIAGNOSIS — N2581 Secondary hyperparathyroidism of renal origin: Secondary | ICD-10-CM | POA: Diagnosis not present

## 2017-10-15 DIAGNOSIS — N186 End stage renal disease: Secondary | ICD-10-CM | POA: Diagnosis not present

## 2017-10-15 DIAGNOSIS — Z5189 Encounter for other specified aftercare: Secondary | ICD-10-CM | POA: Diagnosis not present

## 2017-10-15 DIAGNOSIS — D689 Coagulation defect, unspecified: Secondary | ICD-10-CM | POA: Diagnosis not present

## 2017-10-15 DIAGNOSIS — D509 Iron deficiency anemia, unspecified: Secondary | ICD-10-CM | POA: Diagnosis not present

## 2017-10-16 DIAGNOSIS — D509 Iron deficiency anemia, unspecified: Secondary | ICD-10-CM | POA: Diagnosis not present

## 2017-10-16 DIAGNOSIS — N186 End stage renal disease: Secondary | ICD-10-CM | POA: Diagnosis not present

## 2017-10-16 DIAGNOSIS — D631 Anemia in chronic kidney disease: Secondary | ICD-10-CM | POA: Diagnosis not present

## 2017-10-16 DIAGNOSIS — N2581 Secondary hyperparathyroidism of renal origin: Secondary | ICD-10-CM | POA: Diagnosis not present

## 2017-10-16 DIAGNOSIS — D689 Coagulation defect, unspecified: Secondary | ICD-10-CM | POA: Diagnosis not present

## 2017-10-16 DIAGNOSIS — Z5189 Encounter for other specified aftercare: Secondary | ICD-10-CM | POA: Diagnosis not present

## 2017-10-17 DIAGNOSIS — N186 End stage renal disease: Secondary | ICD-10-CM | POA: Diagnosis not present

## 2017-10-17 DIAGNOSIS — D631 Anemia in chronic kidney disease: Secondary | ICD-10-CM | POA: Diagnosis not present

## 2017-10-17 DIAGNOSIS — Z5189 Encounter for other specified aftercare: Secondary | ICD-10-CM | POA: Diagnosis not present

## 2017-10-17 DIAGNOSIS — D509 Iron deficiency anemia, unspecified: Secondary | ICD-10-CM | POA: Diagnosis not present

## 2017-10-17 DIAGNOSIS — N2581 Secondary hyperparathyroidism of renal origin: Secondary | ICD-10-CM | POA: Diagnosis not present

## 2017-10-17 DIAGNOSIS — D689 Coagulation defect, unspecified: Secondary | ICD-10-CM | POA: Diagnosis not present

## 2017-10-18 DIAGNOSIS — N2581 Secondary hyperparathyroidism of renal origin: Secondary | ICD-10-CM | POA: Diagnosis not present

## 2017-10-18 DIAGNOSIS — D509 Iron deficiency anemia, unspecified: Secondary | ICD-10-CM | POA: Diagnosis not present

## 2017-10-18 DIAGNOSIS — N186 End stage renal disease: Secondary | ICD-10-CM | POA: Diagnosis not present

## 2017-10-18 DIAGNOSIS — Z5189 Encounter for other specified aftercare: Secondary | ICD-10-CM | POA: Diagnosis not present

## 2017-10-18 DIAGNOSIS — D631 Anemia in chronic kidney disease: Secondary | ICD-10-CM | POA: Diagnosis not present

## 2017-10-18 DIAGNOSIS — D689 Coagulation defect, unspecified: Secondary | ICD-10-CM | POA: Diagnosis not present

## 2017-10-19 DIAGNOSIS — N186 End stage renal disease: Secondary | ICD-10-CM | POA: Diagnosis not present

## 2017-10-19 DIAGNOSIS — D631 Anemia in chronic kidney disease: Secondary | ICD-10-CM | POA: Diagnosis not present

## 2017-10-19 DIAGNOSIS — D509 Iron deficiency anemia, unspecified: Secondary | ICD-10-CM | POA: Diagnosis not present

## 2017-10-19 DIAGNOSIS — N2581 Secondary hyperparathyroidism of renal origin: Secondary | ICD-10-CM | POA: Diagnosis not present

## 2017-10-19 DIAGNOSIS — D689 Coagulation defect, unspecified: Secondary | ICD-10-CM | POA: Diagnosis not present

## 2017-10-19 DIAGNOSIS — Z5189 Encounter for other specified aftercare: Secondary | ICD-10-CM | POA: Diagnosis not present

## 2017-10-20 DIAGNOSIS — N186 End stage renal disease: Secondary | ICD-10-CM | POA: Diagnosis not present

## 2017-10-20 DIAGNOSIS — D509 Iron deficiency anemia, unspecified: Secondary | ICD-10-CM | POA: Diagnosis not present

## 2017-10-20 DIAGNOSIS — Z5189 Encounter for other specified aftercare: Secondary | ICD-10-CM | POA: Diagnosis not present

## 2017-10-20 DIAGNOSIS — N2581 Secondary hyperparathyroidism of renal origin: Secondary | ICD-10-CM | POA: Diagnosis not present

## 2017-10-20 DIAGNOSIS — D631 Anemia in chronic kidney disease: Secondary | ICD-10-CM | POA: Diagnosis not present

## 2017-10-20 DIAGNOSIS — D689 Coagulation defect, unspecified: Secondary | ICD-10-CM | POA: Diagnosis not present

## 2017-10-21 DIAGNOSIS — N186 End stage renal disease: Secondary | ICD-10-CM | POA: Diagnosis not present

## 2017-10-21 DIAGNOSIS — D631 Anemia in chronic kidney disease: Secondary | ICD-10-CM | POA: Diagnosis not present

## 2017-10-21 DIAGNOSIS — D509 Iron deficiency anemia, unspecified: Secondary | ICD-10-CM | POA: Diagnosis not present

## 2017-10-21 DIAGNOSIS — D689 Coagulation defect, unspecified: Secondary | ICD-10-CM | POA: Diagnosis not present

## 2017-10-21 DIAGNOSIS — Z5189 Encounter for other specified aftercare: Secondary | ICD-10-CM | POA: Diagnosis not present

## 2017-10-21 DIAGNOSIS — N2581 Secondary hyperparathyroidism of renal origin: Secondary | ICD-10-CM | POA: Diagnosis not present

## 2017-10-22 DIAGNOSIS — N186 End stage renal disease: Secondary | ICD-10-CM | POA: Diagnosis not present

## 2017-10-22 DIAGNOSIS — D509 Iron deficiency anemia, unspecified: Secondary | ICD-10-CM | POA: Diagnosis not present

## 2017-10-22 DIAGNOSIS — D631 Anemia in chronic kidney disease: Secondary | ICD-10-CM | POA: Diagnosis not present

## 2017-10-22 DIAGNOSIS — N2581 Secondary hyperparathyroidism of renal origin: Secondary | ICD-10-CM | POA: Diagnosis not present

## 2017-10-22 DIAGNOSIS — D689 Coagulation defect, unspecified: Secondary | ICD-10-CM | POA: Diagnosis not present

## 2017-10-22 DIAGNOSIS — Z5189 Encounter for other specified aftercare: Secondary | ICD-10-CM | POA: Diagnosis not present

## 2017-10-23 DIAGNOSIS — D631 Anemia in chronic kidney disease: Secondary | ICD-10-CM | POA: Diagnosis not present

## 2017-10-23 DIAGNOSIS — Z5189 Encounter for other specified aftercare: Secondary | ICD-10-CM | POA: Diagnosis not present

## 2017-10-23 DIAGNOSIS — N2581 Secondary hyperparathyroidism of renal origin: Secondary | ICD-10-CM | POA: Diagnosis not present

## 2017-10-23 DIAGNOSIS — D509 Iron deficiency anemia, unspecified: Secondary | ICD-10-CM | POA: Diagnosis not present

## 2017-10-23 DIAGNOSIS — N186 End stage renal disease: Secondary | ICD-10-CM | POA: Diagnosis not present

## 2017-10-23 DIAGNOSIS — D689 Coagulation defect, unspecified: Secondary | ICD-10-CM | POA: Diagnosis not present

## 2017-10-24 DIAGNOSIS — D689 Coagulation defect, unspecified: Secondary | ICD-10-CM | POA: Diagnosis not present

## 2017-10-24 DIAGNOSIS — Z5189 Encounter for other specified aftercare: Secondary | ICD-10-CM | POA: Diagnosis not present

## 2017-10-24 DIAGNOSIS — D509 Iron deficiency anemia, unspecified: Secondary | ICD-10-CM | POA: Diagnosis not present

## 2017-10-24 DIAGNOSIS — N186 End stage renal disease: Secondary | ICD-10-CM | POA: Diagnosis not present

## 2017-10-24 DIAGNOSIS — D631 Anemia in chronic kidney disease: Secondary | ICD-10-CM | POA: Diagnosis not present

## 2017-10-24 DIAGNOSIS — N2581 Secondary hyperparathyroidism of renal origin: Secondary | ICD-10-CM | POA: Diagnosis not present

## 2017-10-25 DIAGNOSIS — D509 Iron deficiency anemia, unspecified: Secondary | ICD-10-CM | POA: Diagnosis not present

## 2017-10-25 DIAGNOSIS — D689 Coagulation defect, unspecified: Secondary | ICD-10-CM | POA: Diagnosis not present

## 2017-10-25 DIAGNOSIS — Z5189 Encounter for other specified aftercare: Secondary | ICD-10-CM | POA: Diagnosis not present

## 2017-10-25 DIAGNOSIS — D631 Anemia in chronic kidney disease: Secondary | ICD-10-CM | POA: Diagnosis not present

## 2017-10-25 DIAGNOSIS — N2581 Secondary hyperparathyroidism of renal origin: Secondary | ICD-10-CM | POA: Diagnosis not present

## 2017-10-25 DIAGNOSIS — N186 End stage renal disease: Secondary | ICD-10-CM | POA: Diagnosis not present

## 2017-10-26 DIAGNOSIS — I129 Hypertensive chronic kidney disease with stage 1 through stage 4 chronic kidney disease, or unspecified chronic kidney disease: Secondary | ICD-10-CM | POA: Diagnosis not present

## 2017-10-26 DIAGNOSIS — D509 Iron deficiency anemia, unspecified: Secondary | ICD-10-CM | POA: Diagnosis not present

## 2017-10-26 DIAGNOSIS — Z992 Dependence on renal dialysis: Secondary | ICD-10-CM | POA: Diagnosis not present

## 2017-10-26 DIAGNOSIS — Z5189 Encounter for other specified aftercare: Secondary | ICD-10-CM | POA: Diagnosis not present

## 2017-10-26 DIAGNOSIS — N186 End stage renal disease: Secondary | ICD-10-CM | POA: Diagnosis not present

## 2017-10-26 DIAGNOSIS — N2581 Secondary hyperparathyroidism of renal origin: Secondary | ICD-10-CM | POA: Diagnosis not present

## 2017-10-26 DIAGNOSIS — D689 Coagulation defect, unspecified: Secondary | ICD-10-CM | POA: Diagnosis not present

## 2017-10-26 DIAGNOSIS — D631 Anemia in chronic kidney disease: Secondary | ICD-10-CM | POA: Diagnosis not present

## 2017-10-27 DIAGNOSIS — D509 Iron deficiency anemia, unspecified: Secondary | ICD-10-CM | POA: Diagnosis not present

## 2017-10-27 DIAGNOSIS — N2581 Secondary hyperparathyroidism of renal origin: Secondary | ICD-10-CM | POA: Diagnosis not present

## 2017-10-27 DIAGNOSIS — N186 End stage renal disease: Secondary | ICD-10-CM | POA: Diagnosis not present

## 2017-10-27 DIAGNOSIS — E440 Moderate protein-calorie malnutrition: Secondary | ICD-10-CM | POA: Diagnosis not present

## 2017-10-27 DIAGNOSIS — Z79899 Other long term (current) drug therapy: Secondary | ICD-10-CM | POA: Diagnosis not present

## 2017-10-27 DIAGNOSIS — Z5189 Encounter for other specified aftercare: Secondary | ICD-10-CM | POA: Diagnosis not present

## 2017-10-27 DIAGNOSIS — D631 Anemia in chronic kidney disease: Secondary | ICD-10-CM | POA: Diagnosis not present

## 2017-10-28 DIAGNOSIS — D509 Iron deficiency anemia, unspecified: Secondary | ICD-10-CM | POA: Diagnosis not present

## 2017-10-28 DIAGNOSIS — D631 Anemia in chronic kidney disease: Secondary | ICD-10-CM | POA: Diagnosis not present

## 2017-10-28 DIAGNOSIS — Z5189 Encounter for other specified aftercare: Secondary | ICD-10-CM | POA: Diagnosis not present

## 2017-10-28 DIAGNOSIS — N2581 Secondary hyperparathyroidism of renal origin: Secondary | ICD-10-CM | POA: Diagnosis not present

## 2017-10-28 DIAGNOSIS — N186 End stage renal disease: Secondary | ICD-10-CM | POA: Diagnosis not present

## 2017-10-28 DIAGNOSIS — E440 Moderate protein-calorie malnutrition: Secondary | ICD-10-CM | POA: Diagnosis not present

## 2017-10-29 DIAGNOSIS — N186 End stage renal disease: Secondary | ICD-10-CM | POA: Diagnosis not present

## 2017-10-29 DIAGNOSIS — D509 Iron deficiency anemia, unspecified: Secondary | ICD-10-CM | POA: Diagnosis not present

## 2017-10-29 DIAGNOSIS — R82998 Other abnormal findings in urine: Secondary | ICD-10-CM | POA: Diagnosis not present

## 2017-10-29 DIAGNOSIS — D631 Anemia in chronic kidney disease: Secondary | ICD-10-CM | POA: Diagnosis not present

## 2017-10-29 DIAGNOSIS — N2581 Secondary hyperparathyroidism of renal origin: Secondary | ICD-10-CM | POA: Diagnosis not present

## 2017-10-29 DIAGNOSIS — E440 Moderate protein-calorie malnutrition: Secondary | ICD-10-CM | POA: Diagnosis not present

## 2017-10-29 DIAGNOSIS — Z5189 Encounter for other specified aftercare: Secondary | ICD-10-CM | POA: Diagnosis not present

## 2017-10-30 DIAGNOSIS — D509 Iron deficiency anemia, unspecified: Secondary | ICD-10-CM | POA: Diagnosis not present

## 2017-10-30 DIAGNOSIS — N2581 Secondary hyperparathyroidism of renal origin: Secondary | ICD-10-CM | POA: Diagnosis not present

## 2017-10-30 DIAGNOSIS — Z5189 Encounter for other specified aftercare: Secondary | ICD-10-CM | POA: Diagnosis not present

## 2017-10-30 DIAGNOSIS — N186 End stage renal disease: Secondary | ICD-10-CM | POA: Diagnosis not present

## 2017-10-30 DIAGNOSIS — E440 Moderate protein-calorie malnutrition: Secondary | ICD-10-CM | POA: Diagnosis not present

## 2017-10-30 DIAGNOSIS — D631 Anemia in chronic kidney disease: Secondary | ICD-10-CM | POA: Diagnosis not present

## 2017-10-31 DIAGNOSIS — E440 Moderate protein-calorie malnutrition: Secondary | ICD-10-CM | POA: Diagnosis not present

## 2017-10-31 DIAGNOSIS — N2581 Secondary hyperparathyroidism of renal origin: Secondary | ICD-10-CM | POA: Diagnosis not present

## 2017-10-31 DIAGNOSIS — D631 Anemia in chronic kidney disease: Secondary | ICD-10-CM | POA: Diagnosis not present

## 2017-10-31 DIAGNOSIS — D509 Iron deficiency anemia, unspecified: Secondary | ICD-10-CM | POA: Diagnosis not present

## 2017-10-31 DIAGNOSIS — Z5189 Encounter for other specified aftercare: Secondary | ICD-10-CM | POA: Diagnosis not present

## 2017-10-31 DIAGNOSIS — N186 End stage renal disease: Secondary | ICD-10-CM | POA: Diagnosis not present

## 2017-11-01 DIAGNOSIS — N186 End stage renal disease: Secondary | ICD-10-CM | POA: Diagnosis not present

## 2017-11-01 DIAGNOSIS — N2581 Secondary hyperparathyroidism of renal origin: Secondary | ICD-10-CM | POA: Diagnosis not present

## 2017-11-01 DIAGNOSIS — D509 Iron deficiency anemia, unspecified: Secondary | ICD-10-CM | POA: Diagnosis not present

## 2017-11-01 DIAGNOSIS — D631 Anemia in chronic kidney disease: Secondary | ICD-10-CM | POA: Diagnosis not present

## 2017-11-01 DIAGNOSIS — E440 Moderate protein-calorie malnutrition: Secondary | ICD-10-CM | POA: Diagnosis not present

## 2017-11-01 DIAGNOSIS — Z5189 Encounter for other specified aftercare: Secondary | ICD-10-CM | POA: Diagnosis not present

## 2017-11-02 DIAGNOSIS — E440 Moderate protein-calorie malnutrition: Secondary | ICD-10-CM | POA: Diagnosis not present

## 2017-11-02 DIAGNOSIS — N2581 Secondary hyperparathyroidism of renal origin: Secondary | ICD-10-CM | POA: Diagnosis not present

## 2017-11-02 DIAGNOSIS — N186 End stage renal disease: Secondary | ICD-10-CM | POA: Diagnosis not present

## 2017-11-02 DIAGNOSIS — D631 Anemia in chronic kidney disease: Secondary | ICD-10-CM | POA: Diagnosis not present

## 2017-11-02 DIAGNOSIS — Z5189 Encounter for other specified aftercare: Secondary | ICD-10-CM | POA: Diagnosis not present

## 2017-11-02 DIAGNOSIS — D509 Iron deficiency anemia, unspecified: Secondary | ICD-10-CM | POA: Diagnosis not present

## 2017-11-03 DIAGNOSIS — N186 End stage renal disease: Secondary | ICD-10-CM | POA: Diagnosis not present

## 2017-11-03 DIAGNOSIS — N2581 Secondary hyperparathyroidism of renal origin: Secondary | ICD-10-CM | POA: Diagnosis not present

## 2017-11-03 DIAGNOSIS — D509 Iron deficiency anemia, unspecified: Secondary | ICD-10-CM | POA: Diagnosis not present

## 2017-11-03 DIAGNOSIS — Z5189 Encounter for other specified aftercare: Secondary | ICD-10-CM | POA: Diagnosis not present

## 2017-11-03 DIAGNOSIS — E440 Moderate protein-calorie malnutrition: Secondary | ICD-10-CM | POA: Diagnosis not present

## 2017-11-03 DIAGNOSIS — D631 Anemia in chronic kidney disease: Secondary | ICD-10-CM | POA: Diagnosis not present

## 2017-11-04 DIAGNOSIS — E440 Moderate protein-calorie malnutrition: Secondary | ICD-10-CM | POA: Diagnosis not present

## 2017-11-04 DIAGNOSIS — N186 End stage renal disease: Secondary | ICD-10-CM | POA: Diagnosis not present

## 2017-11-04 DIAGNOSIS — N2581 Secondary hyperparathyroidism of renal origin: Secondary | ICD-10-CM | POA: Diagnosis not present

## 2017-11-04 DIAGNOSIS — D631 Anemia in chronic kidney disease: Secondary | ICD-10-CM | POA: Diagnosis not present

## 2017-11-04 DIAGNOSIS — Z5189 Encounter for other specified aftercare: Secondary | ICD-10-CM | POA: Diagnosis not present

## 2017-11-04 DIAGNOSIS — D509 Iron deficiency anemia, unspecified: Secondary | ICD-10-CM | POA: Diagnosis not present

## 2017-11-05 DIAGNOSIS — D631 Anemia in chronic kidney disease: Secondary | ICD-10-CM | POA: Diagnosis not present

## 2017-11-05 DIAGNOSIS — E440 Moderate protein-calorie malnutrition: Secondary | ICD-10-CM | POA: Diagnosis not present

## 2017-11-05 DIAGNOSIS — D509 Iron deficiency anemia, unspecified: Secondary | ICD-10-CM | POA: Diagnosis not present

## 2017-11-05 DIAGNOSIS — N186 End stage renal disease: Secondary | ICD-10-CM | POA: Diagnosis not present

## 2017-11-05 DIAGNOSIS — Z5189 Encounter for other specified aftercare: Secondary | ICD-10-CM | POA: Diagnosis not present

## 2017-11-05 DIAGNOSIS — N2581 Secondary hyperparathyroidism of renal origin: Secondary | ICD-10-CM | POA: Diagnosis not present

## 2017-11-06 DIAGNOSIS — N2581 Secondary hyperparathyroidism of renal origin: Secondary | ICD-10-CM | POA: Diagnosis not present

## 2017-11-06 DIAGNOSIS — N186 End stage renal disease: Secondary | ICD-10-CM | POA: Diagnosis not present

## 2017-11-06 DIAGNOSIS — E440 Moderate protein-calorie malnutrition: Secondary | ICD-10-CM | POA: Diagnosis not present

## 2017-11-06 DIAGNOSIS — Z5189 Encounter for other specified aftercare: Secondary | ICD-10-CM | POA: Diagnosis not present

## 2017-11-06 DIAGNOSIS — D509 Iron deficiency anemia, unspecified: Secondary | ICD-10-CM | POA: Diagnosis not present

## 2017-11-06 DIAGNOSIS — D631 Anemia in chronic kidney disease: Secondary | ICD-10-CM | POA: Diagnosis not present

## 2017-11-07 DIAGNOSIS — D509 Iron deficiency anemia, unspecified: Secondary | ICD-10-CM | POA: Diagnosis not present

## 2017-11-07 DIAGNOSIS — D631 Anemia in chronic kidney disease: Secondary | ICD-10-CM | POA: Diagnosis not present

## 2017-11-07 DIAGNOSIS — N186 End stage renal disease: Secondary | ICD-10-CM | POA: Diagnosis not present

## 2017-11-07 DIAGNOSIS — N2581 Secondary hyperparathyroidism of renal origin: Secondary | ICD-10-CM | POA: Diagnosis not present

## 2017-11-07 DIAGNOSIS — E440 Moderate protein-calorie malnutrition: Secondary | ICD-10-CM | POA: Diagnosis not present

## 2017-11-07 DIAGNOSIS — Z5189 Encounter for other specified aftercare: Secondary | ICD-10-CM | POA: Diagnosis not present

## 2017-11-08 DIAGNOSIS — N2581 Secondary hyperparathyroidism of renal origin: Secondary | ICD-10-CM | POA: Diagnosis not present

## 2017-11-08 DIAGNOSIS — E440 Moderate protein-calorie malnutrition: Secondary | ICD-10-CM | POA: Diagnosis not present

## 2017-11-08 DIAGNOSIS — N186 End stage renal disease: Secondary | ICD-10-CM | POA: Diagnosis not present

## 2017-11-08 DIAGNOSIS — D631 Anemia in chronic kidney disease: Secondary | ICD-10-CM | POA: Diagnosis not present

## 2017-11-08 DIAGNOSIS — D509 Iron deficiency anemia, unspecified: Secondary | ICD-10-CM | POA: Diagnosis not present

## 2017-11-08 DIAGNOSIS — Z5189 Encounter for other specified aftercare: Secondary | ICD-10-CM | POA: Diagnosis not present

## 2017-11-09 DIAGNOSIS — D509 Iron deficiency anemia, unspecified: Secondary | ICD-10-CM | POA: Diagnosis not present

## 2017-11-09 DIAGNOSIS — Z5189 Encounter for other specified aftercare: Secondary | ICD-10-CM | POA: Diagnosis not present

## 2017-11-09 DIAGNOSIS — D631 Anemia in chronic kidney disease: Secondary | ICD-10-CM | POA: Diagnosis not present

## 2017-11-09 DIAGNOSIS — N186 End stage renal disease: Secondary | ICD-10-CM | POA: Diagnosis not present

## 2017-11-09 DIAGNOSIS — E440 Moderate protein-calorie malnutrition: Secondary | ICD-10-CM | POA: Diagnosis not present

## 2017-11-09 DIAGNOSIS — N2581 Secondary hyperparathyroidism of renal origin: Secondary | ICD-10-CM | POA: Diagnosis not present

## 2017-11-10 DIAGNOSIS — E440 Moderate protein-calorie malnutrition: Secondary | ICD-10-CM | POA: Diagnosis not present

## 2017-11-10 DIAGNOSIS — N2581 Secondary hyperparathyroidism of renal origin: Secondary | ICD-10-CM | POA: Diagnosis not present

## 2017-11-10 DIAGNOSIS — D631 Anemia in chronic kidney disease: Secondary | ICD-10-CM | POA: Diagnosis not present

## 2017-11-10 DIAGNOSIS — D509 Iron deficiency anemia, unspecified: Secondary | ICD-10-CM | POA: Diagnosis not present

## 2017-11-10 DIAGNOSIS — Z5189 Encounter for other specified aftercare: Secondary | ICD-10-CM | POA: Diagnosis not present

## 2017-11-10 DIAGNOSIS — N186 End stage renal disease: Secondary | ICD-10-CM | POA: Diagnosis not present

## 2017-11-11 DIAGNOSIS — Z5189 Encounter for other specified aftercare: Secondary | ICD-10-CM | POA: Diagnosis not present

## 2017-11-11 DIAGNOSIS — D631 Anemia in chronic kidney disease: Secondary | ICD-10-CM | POA: Diagnosis not present

## 2017-11-11 DIAGNOSIS — D509 Iron deficiency anemia, unspecified: Secondary | ICD-10-CM | POA: Diagnosis not present

## 2017-11-11 DIAGNOSIS — E440 Moderate protein-calorie malnutrition: Secondary | ICD-10-CM | POA: Diagnosis not present

## 2017-11-11 DIAGNOSIS — N2581 Secondary hyperparathyroidism of renal origin: Secondary | ICD-10-CM | POA: Diagnosis not present

## 2017-11-11 DIAGNOSIS — N186 End stage renal disease: Secondary | ICD-10-CM | POA: Diagnosis not present

## 2017-11-12 DIAGNOSIS — N2581 Secondary hyperparathyroidism of renal origin: Secondary | ICD-10-CM | POA: Diagnosis not present

## 2017-11-12 DIAGNOSIS — D509 Iron deficiency anemia, unspecified: Secondary | ICD-10-CM | POA: Diagnosis not present

## 2017-11-12 DIAGNOSIS — D631 Anemia in chronic kidney disease: Secondary | ICD-10-CM | POA: Diagnosis not present

## 2017-11-12 DIAGNOSIS — N186 End stage renal disease: Secondary | ICD-10-CM | POA: Diagnosis not present

## 2017-11-12 DIAGNOSIS — Z5189 Encounter for other specified aftercare: Secondary | ICD-10-CM | POA: Diagnosis not present

## 2017-11-12 DIAGNOSIS — E440 Moderate protein-calorie malnutrition: Secondary | ICD-10-CM | POA: Diagnosis not present

## 2017-11-13 DIAGNOSIS — E440 Moderate protein-calorie malnutrition: Secondary | ICD-10-CM | POA: Diagnosis not present

## 2017-11-13 DIAGNOSIS — D631 Anemia in chronic kidney disease: Secondary | ICD-10-CM | POA: Diagnosis not present

## 2017-11-13 DIAGNOSIS — N2581 Secondary hyperparathyroidism of renal origin: Secondary | ICD-10-CM | POA: Diagnosis not present

## 2017-11-13 DIAGNOSIS — N186 End stage renal disease: Secondary | ICD-10-CM | POA: Diagnosis not present

## 2017-11-13 DIAGNOSIS — D509 Iron deficiency anemia, unspecified: Secondary | ICD-10-CM | POA: Diagnosis not present

## 2017-11-13 DIAGNOSIS — Z5189 Encounter for other specified aftercare: Secondary | ICD-10-CM | POA: Diagnosis not present

## 2017-11-14 DIAGNOSIS — N186 End stage renal disease: Secondary | ICD-10-CM | POA: Diagnosis not present

## 2017-11-14 DIAGNOSIS — D631 Anemia in chronic kidney disease: Secondary | ICD-10-CM | POA: Diagnosis not present

## 2017-11-14 DIAGNOSIS — N2581 Secondary hyperparathyroidism of renal origin: Secondary | ICD-10-CM | POA: Diagnosis not present

## 2017-11-14 DIAGNOSIS — E440 Moderate protein-calorie malnutrition: Secondary | ICD-10-CM | POA: Diagnosis not present

## 2017-11-14 DIAGNOSIS — Z5189 Encounter for other specified aftercare: Secondary | ICD-10-CM | POA: Diagnosis not present

## 2017-11-14 DIAGNOSIS — D509 Iron deficiency anemia, unspecified: Secondary | ICD-10-CM | POA: Diagnosis not present

## 2017-11-15 DIAGNOSIS — Z5189 Encounter for other specified aftercare: Secondary | ICD-10-CM | POA: Diagnosis not present

## 2017-11-15 DIAGNOSIS — N186 End stage renal disease: Secondary | ICD-10-CM | POA: Diagnosis not present

## 2017-11-15 DIAGNOSIS — D631 Anemia in chronic kidney disease: Secondary | ICD-10-CM | POA: Diagnosis not present

## 2017-11-15 DIAGNOSIS — N2581 Secondary hyperparathyroidism of renal origin: Secondary | ICD-10-CM | POA: Diagnosis not present

## 2017-11-15 DIAGNOSIS — E440 Moderate protein-calorie malnutrition: Secondary | ICD-10-CM | POA: Diagnosis not present

## 2017-11-15 DIAGNOSIS — D509 Iron deficiency anemia, unspecified: Secondary | ICD-10-CM | POA: Diagnosis not present

## 2017-11-16 DIAGNOSIS — D631 Anemia in chronic kidney disease: Secondary | ICD-10-CM | POA: Diagnosis not present

## 2017-11-16 DIAGNOSIS — D509 Iron deficiency anemia, unspecified: Secondary | ICD-10-CM | POA: Diagnosis not present

## 2017-11-16 DIAGNOSIS — N2581 Secondary hyperparathyroidism of renal origin: Secondary | ICD-10-CM | POA: Diagnosis not present

## 2017-11-16 DIAGNOSIS — Z5189 Encounter for other specified aftercare: Secondary | ICD-10-CM | POA: Diagnosis not present

## 2017-11-16 DIAGNOSIS — N186 End stage renal disease: Secondary | ICD-10-CM | POA: Diagnosis not present

## 2017-11-16 DIAGNOSIS — E440 Moderate protein-calorie malnutrition: Secondary | ICD-10-CM | POA: Diagnosis not present

## 2017-11-17 DIAGNOSIS — E440 Moderate protein-calorie malnutrition: Secondary | ICD-10-CM | POA: Diagnosis not present

## 2017-11-17 DIAGNOSIS — N2581 Secondary hyperparathyroidism of renal origin: Secondary | ICD-10-CM | POA: Diagnosis not present

## 2017-11-17 DIAGNOSIS — D509 Iron deficiency anemia, unspecified: Secondary | ICD-10-CM | POA: Diagnosis not present

## 2017-11-17 DIAGNOSIS — Z5189 Encounter for other specified aftercare: Secondary | ICD-10-CM | POA: Diagnosis not present

## 2017-11-17 DIAGNOSIS — D631 Anemia in chronic kidney disease: Secondary | ICD-10-CM | POA: Diagnosis not present

## 2017-11-17 DIAGNOSIS — N186 End stage renal disease: Secondary | ICD-10-CM | POA: Diagnosis not present

## 2017-11-18 DIAGNOSIS — D509 Iron deficiency anemia, unspecified: Secondary | ICD-10-CM | POA: Diagnosis not present

## 2017-11-18 DIAGNOSIS — E440 Moderate protein-calorie malnutrition: Secondary | ICD-10-CM | POA: Diagnosis not present

## 2017-11-18 DIAGNOSIS — D631 Anemia in chronic kidney disease: Secondary | ICD-10-CM | POA: Diagnosis not present

## 2017-11-18 DIAGNOSIS — N186 End stage renal disease: Secondary | ICD-10-CM | POA: Diagnosis not present

## 2017-11-18 DIAGNOSIS — N2581 Secondary hyperparathyroidism of renal origin: Secondary | ICD-10-CM | POA: Diagnosis not present

## 2017-11-18 DIAGNOSIS — Z5189 Encounter for other specified aftercare: Secondary | ICD-10-CM | POA: Diagnosis not present

## 2017-11-19 DIAGNOSIS — N186 End stage renal disease: Secondary | ICD-10-CM | POA: Diagnosis not present

## 2017-11-19 DIAGNOSIS — D631 Anemia in chronic kidney disease: Secondary | ICD-10-CM | POA: Diagnosis not present

## 2017-11-19 DIAGNOSIS — Z5189 Encounter for other specified aftercare: Secondary | ICD-10-CM | POA: Diagnosis not present

## 2017-11-19 DIAGNOSIS — E440 Moderate protein-calorie malnutrition: Secondary | ICD-10-CM | POA: Diagnosis not present

## 2017-11-19 DIAGNOSIS — D509 Iron deficiency anemia, unspecified: Secondary | ICD-10-CM | POA: Diagnosis not present

## 2017-11-19 DIAGNOSIS — N2581 Secondary hyperparathyroidism of renal origin: Secondary | ICD-10-CM | POA: Diagnosis not present

## 2017-11-20 DIAGNOSIS — E440 Moderate protein-calorie malnutrition: Secondary | ICD-10-CM | POA: Diagnosis not present

## 2017-11-20 DIAGNOSIS — D631 Anemia in chronic kidney disease: Secondary | ICD-10-CM | POA: Diagnosis not present

## 2017-11-20 DIAGNOSIS — Z5189 Encounter for other specified aftercare: Secondary | ICD-10-CM | POA: Diagnosis not present

## 2017-11-20 DIAGNOSIS — N2581 Secondary hyperparathyroidism of renal origin: Secondary | ICD-10-CM | POA: Diagnosis not present

## 2017-11-20 DIAGNOSIS — D509 Iron deficiency anemia, unspecified: Secondary | ICD-10-CM | POA: Diagnosis not present

## 2017-11-20 DIAGNOSIS — N186 End stage renal disease: Secondary | ICD-10-CM | POA: Diagnosis not present

## 2017-11-21 DIAGNOSIS — D509 Iron deficiency anemia, unspecified: Secondary | ICD-10-CM | POA: Diagnosis not present

## 2017-11-21 DIAGNOSIS — N186 End stage renal disease: Secondary | ICD-10-CM | POA: Diagnosis not present

## 2017-11-21 DIAGNOSIS — D631 Anemia in chronic kidney disease: Secondary | ICD-10-CM | POA: Diagnosis not present

## 2017-11-21 DIAGNOSIS — N2581 Secondary hyperparathyroidism of renal origin: Secondary | ICD-10-CM | POA: Diagnosis not present

## 2017-11-21 DIAGNOSIS — E440 Moderate protein-calorie malnutrition: Secondary | ICD-10-CM | POA: Diagnosis not present

## 2017-11-21 DIAGNOSIS — Z5189 Encounter for other specified aftercare: Secondary | ICD-10-CM | POA: Diagnosis not present

## 2017-11-22 DIAGNOSIS — Z5189 Encounter for other specified aftercare: Secondary | ICD-10-CM | POA: Diagnosis not present

## 2017-11-22 DIAGNOSIS — E440 Moderate protein-calorie malnutrition: Secondary | ICD-10-CM | POA: Diagnosis not present

## 2017-11-22 DIAGNOSIS — N186 End stage renal disease: Secondary | ICD-10-CM | POA: Diagnosis not present

## 2017-11-22 DIAGNOSIS — D509 Iron deficiency anemia, unspecified: Secondary | ICD-10-CM | POA: Diagnosis not present

## 2017-11-22 DIAGNOSIS — N2581 Secondary hyperparathyroidism of renal origin: Secondary | ICD-10-CM | POA: Diagnosis not present

## 2017-11-22 DIAGNOSIS — D631 Anemia in chronic kidney disease: Secondary | ICD-10-CM | POA: Diagnosis not present

## 2017-11-23 DIAGNOSIS — N2581 Secondary hyperparathyroidism of renal origin: Secondary | ICD-10-CM | POA: Diagnosis not present

## 2017-11-23 DIAGNOSIS — D509 Iron deficiency anemia, unspecified: Secondary | ICD-10-CM | POA: Diagnosis not present

## 2017-11-23 DIAGNOSIS — D631 Anemia in chronic kidney disease: Secondary | ICD-10-CM | POA: Diagnosis not present

## 2017-11-23 DIAGNOSIS — Z5189 Encounter for other specified aftercare: Secondary | ICD-10-CM | POA: Diagnosis not present

## 2017-11-23 DIAGNOSIS — E440 Moderate protein-calorie malnutrition: Secondary | ICD-10-CM | POA: Diagnosis not present

## 2017-11-23 DIAGNOSIS — N186 End stage renal disease: Secondary | ICD-10-CM | POA: Diagnosis not present

## 2017-11-24 ENCOUNTER — Other Ambulatory Visit: Payer: Self-pay

## 2017-11-24 ENCOUNTER — Emergency Department (HOSPITAL_COMMUNITY): Payer: Medicare Other

## 2017-11-24 ENCOUNTER — Inpatient Hospital Stay (HOSPITAL_COMMUNITY): Payer: Medicare Other

## 2017-11-24 ENCOUNTER — Encounter (HOSPITAL_COMMUNITY): Payer: Self-pay

## 2017-11-24 ENCOUNTER — Inpatient Hospital Stay (HOSPITAL_COMMUNITY)
Admission: EM | Admit: 2017-11-24 | Discharge: 2017-12-01 | DRG: 304 | Disposition: A | Discharge status: Home Health | Payer: Medicare Other | Attending: Internal Medicine | Admitting: Internal Medicine

## 2017-11-24 DIAGNOSIS — Z992 Dependence on renal dialysis: Secondary | ICD-10-CM

## 2017-11-24 DIAGNOSIS — E0865 Diabetes mellitus due to underlying condition with hyperglycemia: Secondary | ICD-10-CM | POA: Diagnosis not present

## 2017-11-24 DIAGNOSIS — E785 Hyperlipidemia, unspecified: Secondary | ICD-10-CM | POA: Diagnosis present

## 2017-11-24 DIAGNOSIS — Z87891 Personal history of nicotine dependence: Secondary | ICD-10-CM | POA: Diagnosis not present

## 2017-11-24 DIAGNOSIS — K219 Gastro-esophageal reflux disease without esophagitis: Secondary | ICD-10-CM | POA: Diagnosis present

## 2017-11-24 DIAGNOSIS — N2581 Secondary hyperparathyroidism of renal origin: Secondary | ICD-10-CM | POA: Diagnosis not present

## 2017-11-24 DIAGNOSIS — Z8673 Personal history of transient ischemic attack (TIA), and cerebral infarction without residual deficits: Secondary | ICD-10-CM | POA: Diagnosis not present

## 2017-11-24 DIAGNOSIS — E11649 Type 2 diabetes mellitus with hypoglycemia without coma: Secondary | ICD-10-CM | POA: Diagnosis present

## 2017-11-24 DIAGNOSIS — R4182 Altered mental status, unspecified: Secondary | ICD-10-CM | POA: Diagnosis not present

## 2017-11-24 DIAGNOSIS — K3184 Gastroparesis: Secondary | ICD-10-CM | POA: Diagnosis present

## 2017-11-24 DIAGNOSIS — I120 Hypertensive chronic kidney disease with stage 5 chronic kidney disease or end stage renal disease: Secondary | ICD-10-CM | POA: Diagnosis present

## 2017-11-24 DIAGNOSIS — I129 Hypertensive chronic kidney disease with stage 1 through stage 4 chronic kidney disease, or unspecified chronic kidney disease: Secondary | ICD-10-CM | POA: Diagnosis not present

## 2017-11-24 DIAGNOSIS — G9340 Encephalopathy, unspecified: Secondary | ICD-10-CM

## 2017-11-24 DIAGNOSIS — Z79899 Other long term (current) drug therapy: Secondary | ICD-10-CM

## 2017-11-24 DIAGNOSIS — E1122 Type 2 diabetes mellitus with diabetic chronic kidney disease: Secondary | ICD-10-CM | POA: Diagnosis present

## 2017-11-24 DIAGNOSIS — D631 Anemia in chronic kidney disease: Secondary | ICD-10-CM | POA: Diagnosis not present

## 2017-11-24 DIAGNOSIS — Z5189 Encounter for other specified aftercare: Secondary | ICD-10-CM | POA: Diagnosis not present

## 2017-11-24 DIAGNOSIS — N186 End stage renal disease: Secondary | ICD-10-CM

## 2017-11-24 DIAGNOSIS — D638 Anemia in other chronic diseases classified elsewhere: Secondary | ICD-10-CM | POA: Diagnosis not present

## 2017-11-24 DIAGNOSIS — E1142 Type 2 diabetes mellitus with diabetic polyneuropathy: Secondary | ICD-10-CM | POA: Diagnosis not present

## 2017-11-24 DIAGNOSIS — R112 Nausea with vomiting, unspecified: Secondary | ICD-10-CM

## 2017-11-24 DIAGNOSIS — E1143 Type 2 diabetes mellitus with diabetic autonomic (poly)neuropathy: Secondary | ICD-10-CM | POA: Diagnosis present

## 2017-11-24 DIAGNOSIS — R4020 Unspecified coma: Secondary | ICD-10-CM | POA: Diagnosis not present

## 2017-11-24 DIAGNOSIS — E1165 Type 2 diabetes mellitus with hyperglycemia: Secondary | ICD-10-CM | POA: Diagnosis present

## 2017-11-24 DIAGNOSIS — Z8546 Personal history of malignant neoplasm of prostate: Secondary | ICD-10-CM

## 2017-11-24 DIAGNOSIS — N2589 Other disorders resulting from impaired renal tubular function: Secondary | ICD-10-CM | POA: Diagnosis not present

## 2017-11-24 DIAGNOSIS — R82998 Other abnormal findings in urine: Secondary | ICD-10-CM | POA: Diagnosis not present

## 2017-11-24 DIAGNOSIS — G40909 Epilepsy, unspecified, not intractable, without status epilepticus: Secondary | ICD-10-CM | POA: Diagnosis present

## 2017-11-24 DIAGNOSIS — I161 Hypertensive emergency: Principal | ICD-10-CM | POA: Diagnosis present

## 2017-11-24 DIAGNOSIS — R402441 Other coma, without documented Glasgow coma scale score, or with partial score reported, in the field [EMT or ambulance]: Secondary | ICD-10-CM | POA: Diagnosis not present

## 2017-11-24 DIAGNOSIS — G9341 Metabolic encephalopathy: Secondary | ICD-10-CM | POA: Diagnosis present

## 2017-11-24 DIAGNOSIS — E872 Acidosis: Secondary | ICD-10-CM | POA: Diagnosis present

## 2017-11-24 DIAGNOSIS — H5509 Other forms of nystagmus: Secondary | ICD-10-CM | POA: Diagnosis present

## 2017-11-24 DIAGNOSIS — Z794 Long term (current) use of insulin: Secondary | ICD-10-CM

## 2017-11-24 DIAGNOSIS — E440 Moderate protein-calorie malnutrition: Secondary | ICD-10-CM | POA: Diagnosis not present

## 2017-11-24 DIAGNOSIS — H8112 Benign paroxysmal vertigo, left ear: Secondary | ICD-10-CM | POA: Diagnosis present

## 2017-11-24 DIAGNOSIS — I10 Essential (primary) hypertension: Secondary | ICD-10-CM | POA: Diagnosis not present

## 2017-11-24 DIAGNOSIS — K769 Liver disease, unspecified: Secondary | ICD-10-CM | POA: Diagnosis not present

## 2017-11-24 DIAGNOSIS — N189 Chronic kidney disease, unspecified: Secondary | ICD-10-CM | POA: Diagnosis present

## 2017-11-24 DIAGNOSIS — IMO0001 Reserved for inherently not codable concepts without codable children: Secondary | ICD-10-CM

## 2017-11-24 DIAGNOSIS — D509 Iron deficiency anemia, unspecified: Secondary | ICD-10-CM | POA: Diagnosis not present

## 2017-11-24 LAB — CBG MONITORING, ED
GLUCOSE-CAPILLARY: 344 mg/dL — AB (ref 65–99)
GLUCOSE-CAPILLARY: 404 mg/dL — AB (ref 65–99)
Glucose-Capillary: 508 mg/dL (ref 65–99)

## 2017-11-24 LAB — BASIC METABOLIC PANEL
ANION GAP: 26 — AB (ref 5–15)
BUN: 86 mg/dL — ABNORMAL HIGH (ref 6–20)
BUN: 103 mg/dL — ABNORMAL HIGH (ref 6–20)
CALCIUM: 9.6 mg/dL (ref 8.9–10.3)
CHLORIDE: 94 mmol/L — AB (ref 101–111)
CO2: 22 mmol/L (ref 22–32)
CO2: 19 mmol/L — AB (ref 22–32)
Calcium: 9.5 mg/dL (ref 8.9–10.3)
Chloride: 95 mmol/L — ABNORMAL LOW (ref 101–111)
Creatinine, Ser: 14.27 mg/dL — ABNORMAL HIGH (ref 0.61–1.24)
Creatinine, Ser: 15.97 mg/dL — ABNORMAL HIGH (ref 0.61–1.24)
GFR calc non Af Amer: 3 mL/min — ABNORMAL LOW (ref >60)
GFR, EST AFRICAN AMERICAN: 4 mL/min — AB (ref >60)
GFR, EST AFRICAN AMERICAN: 3 mL/min — AB (ref >60)
GFR, EST NON AFRICAN AMERICAN: 3 mL/min — AB (ref >60)
Glucose, Bld: 345 mg/dL — ABNORMAL HIGH (ref 65–99)
Glucose, Bld: 365 mg/dL — ABNORMAL HIGH (ref 65–99)
POTASSIUM: 5 mmol/L (ref 3.5–5.1)
Potassium: 5.9 mmol/L — ABNORMAL HIGH (ref 3.5–5.1)
SODIUM: 139 mmol/L (ref 135–145)
Sodium: 139 mmol/L (ref 135–145)

## 2017-11-24 LAB — URINALYSIS, COMPLETE (UACMP) WITH MICROSCOPIC
BILIRUBIN URINE: NEGATIVE
KETONES UR: 80 mg/dL — AB
LEUKOCYTES UA: NEGATIVE
NITRITE: NEGATIVE
PH: 8 (ref 5.0–8.0)
Protein, ur: >=300 mg/dL — AB
Specific Gravity, Urine: 1.01 (ref 1.005–1.030)

## 2017-11-24 LAB — RAPID URINE DRUG SCREEN, HOSP PERFORMED
Amphetamines: NOT DETECTED
Barbiturates: NOT DETECTED
Benzodiazepines: NOT DETECTED
Cocaine: NOT DETECTED
OPIATES: NOT DETECTED
TETRAHYDROCANNABINOL: NOT DETECTED

## 2017-11-24 LAB — GLUCOSE, CAPILLARY
GLUCOSE-CAPILLARY: 276 mg/dL — AB (ref 65–99)
GLUCOSE-CAPILLARY: 337 mg/dL — AB (ref 65–99)

## 2017-11-24 LAB — HEPATIC FUNCTION PANEL
ALT: 17 U/L (ref 17–63)
AST: 30 U/L (ref 15–41)
Albumin: 3.2 g/dL — ABNORMAL LOW (ref 3.5–5.0)
Alkaline Phosphatase: 59 U/L (ref 38–126)
BILIRUBIN DIRECT: 0.1 mg/dL (ref 0.1–0.5)
BILIRUBIN TOTAL: 1.5 mg/dL — AB (ref 0.3–1.2)
Indirect Bilirubin: 1.4 mg/dL — ABNORMAL HIGH (ref 0.3–0.9)
Total Protein: 6.2 g/dL — ABNORMAL LOW (ref 6.5–8.1)

## 2017-11-24 LAB — MRSA PCR SCREENING: MRSA by PCR: NEGATIVE

## 2017-11-24 LAB — CBC WITH DIFFERENTIAL/PLATELET
BASOS PCT: 0 %
Basophils Absolute: 0 10*3/uL (ref 0.0–0.1)
EOS ABS: 0 10*3/uL (ref 0.0–0.7)
Eosinophils Relative: 0 %
HCT: 43.3 % (ref 39.0–52.0)
HEMOGLOBIN: 14.4 g/dL (ref 13.0–17.0)
Lymphocytes Relative: 14 %
Lymphs Abs: 1.1 10*3/uL (ref 0.7–4.0)
MCH: 28.2 pg (ref 26.0–34.0)
MCHC: 33.3 g/dL (ref 30.0–36.0)
MCV: 84.7 fL (ref 78.0–100.0)
MONO ABS: 0.4 10*3/uL (ref 0.1–1.0)
MONOS PCT: 5 %
NEUTROS PCT: 81 %
Neutro Abs: 6.5 10*3/uL (ref 1.7–7.7)
Platelets: 161 10*3/uL (ref 150–400)
RBC: 5.11 MIL/uL (ref 4.22–5.81)
RDW: 14.5 % (ref 11.5–15.5)
WBC: 8 10*3/uL (ref 4.0–10.5)

## 2017-11-24 LAB — AMMONIA: AMMONIA: 23 umol/L (ref 9–35)

## 2017-11-24 LAB — TROPONIN I: TROPONIN I: 0.04 ng/mL — AB (ref <0.03)

## 2017-11-24 LAB — ETHANOL: Alcohol, Ethyl (B): <10 mg/dL (ref <10)

## 2017-11-24 MED ORDER — SODIUM CHLORIDE 0.9% FLUSH
3.0000 mL | Freq: Two times a day (BID) | INTRAVENOUS | Status: DC
  Administered 2017-11-24 – 2017-12-01 (×10): 3 mL via INTRAVENOUS

## 2017-11-24 MED ORDER — INSULIN ASPART 100 UNIT/ML ~~LOC~~ SOLN
0.0000 [IU] | SUBCUTANEOUS | Status: DC
  Administered 2017-11-24: 7 [IU] via SUBCUTANEOUS
  Administered 2017-11-25: 1 [IU] via SUBCUTANEOUS
  Administered 2017-11-25: 3 [IU] via SUBCUTANEOUS
  Administered 2017-11-25: 5 [IU] via SUBCUTANEOUS
  Administered 2017-11-25: 1 [IU] via SUBCUTANEOUS
  Administered 2017-11-25: 3 [IU] via SUBCUTANEOUS
  Administered 2017-11-26: 1 [IU] via SUBCUTANEOUS
  Administered 2017-11-26 (×2): 5 [IU] via SUBCUTANEOUS
  Administered 2017-11-26: 2 [IU] via SUBCUTANEOUS
  Administered 2017-11-27: 5 [IU] via SUBCUTANEOUS
  Administered 2017-11-27 (×2): 2 [IU] via SUBCUTANEOUS
  Administered 2017-11-27: 1 [IU] via SUBCUTANEOUS
  Administered 2017-11-27 (×2): 2 [IU] via SUBCUTANEOUS
  Administered 2017-11-28 – 2017-11-29 (×6): 3 [IU] via SUBCUTANEOUS
  Administered 2017-11-29: 7 [IU] via SUBCUTANEOUS
  Administered 2017-11-29 (×2): 2 [IU] via SUBCUTANEOUS
  Administered 2017-11-30: 1 [IU] via SUBCUTANEOUS
  Administered 2017-11-30: 5 [IU] via SUBCUTANEOUS
  Filled 2017-11-24: qty 1

## 2017-11-24 MED ORDER — LORAZEPAM 2 MG/ML IJ SOLN
1.0000 mg | Freq: Once | INTRAMUSCULAR | Status: AC
  Administered 2017-11-24: 1 mg via INTRAVENOUS
  Filled 2017-11-24: qty 1

## 2017-11-24 MED ORDER — INSULIN ASPART 100 UNIT/ML ~~LOC~~ SOLN
15.0000 [IU] | Freq: Once | SUBCUTANEOUS | Status: AC
  Administered 2017-11-24: 15 [IU] via SUBCUTANEOUS

## 2017-11-24 MED ORDER — NITROGLYCERIN IN D5W 200-5 MCG/ML-% IV SOLN
0.0000 ug/min | INTRAVENOUS | Status: DC
  Administered 2017-11-24: 5 ug/min via INTRAVENOUS
  Administered 2017-11-25: 150 ug/min via INTRAVENOUS
  Administered 2017-11-25: 140 ug/min via INTRAVENOUS
  Administered 2017-11-25: 135 ug/min via INTRAVENOUS
  Administered 2017-11-25: 165 ug/min via INTRAVENOUS
  Administered 2017-11-25: 95 ug/min via INTRAVENOUS
  Administered 2017-11-25: 65 ug/min via INTRAVENOUS
  Administered 2017-11-25: 155 ug/min via INTRAVENOUS
  Administered 2017-11-25: 175 ug/min via INTRAVENOUS
  Administered 2017-11-25 – 2017-11-26 (×2): 120 ug/min via INTRAVENOUS
  Administered 2017-11-26: 180 ug/min via INTRAVENOUS
  Administered 2017-11-27 – 2017-11-29 (×6): 120 ug/min via INTRAVENOUS
  Administered 2017-11-30: 103.333 ug/min via INTRAVENOUS
  Filled 2017-11-24 (×20): qty 250

## 2017-11-24 MED ORDER — GENTAMICIN SULFATE 0.1 % EX CREA
1.0000 "application " | TOPICAL_CREAM | Freq: Every day | CUTANEOUS | Status: DC
  Administered 2017-11-24 – 2017-12-01 (×11): 1 via TOPICAL
  Filled 2017-11-24 (×2): qty 15

## 2017-11-24 MED ORDER — SODIUM CHLORIDE 0.9 % IV SOLN: 250.0000 mL | INTRAVENOUS | Status: DC | PRN

## 2017-11-24 MED ORDER — HYDRALAZINE HCL 20 MG/ML IJ SOLN
10.0000 mg | Freq: Four times a day (QID) | INTRAMUSCULAR | Status: DC
  Administered 2017-11-24 (×3): 10 mg via INTRAVENOUS
  Filled 2017-11-24 (×3): qty 1

## 2017-11-24 MED ORDER — LABETALOL HCL 5 MG/ML IV SOLN
20.0000 mg | INTRAVENOUS | Status: DC | PRN
  Administered 2017-11-24 – 2017-11-25 (×6): 20 mg via INTRAVENOUS
  Filled 2017-11-24 (×8): qty 4

## 2017-11-24 MED ORDER — SODIUM CHLORIDE 0.9% FLUSH: 3.0000 mL | INTRAVENOUS | Status: DC | PRN

## 2017-11-24 MED ORDER — DELFLEX-LC/2.5% DEXTROSE 394 MOSM/L IP SOLN: INTRAPERITONEAL | Status: DC

## 2017-11-24 MED ORDER — LORAZEPAM 2 MG/ML IJ SOLN
1.0000 mg | INTRAMUSCULAR | Status: AC
  Administered 2017-11-24: 1 mg via INTRAVENOUS
  Filled 2017-11-24: qty 1

## 2017-11-24 MED ORDER — HYDRALAZINE HCL 20 MG/ML IJ SOLN
10.0000 mg | Freq: Once | INTRAMUSCULAR | Status: AC
  Administered 2017-11-24: 10 mg via INTRAVENOUS
  Filled 2017-11-24: qty 1

## 2017-11-24 MED ORDER — NITROGLYCERIN 0.2 MG/HR TD PT24
0.2000 mg | MEDICATED_PATCH | Freq: Every day | TRANSDERMAL | Status: DC
  Administered 2017-11-24 – 2017-11-25 (×2): 0.2 mg via TRANSDERMAL
  Filled 2017-11-24 (×3): qty 1

## 2017-11-24 MED ORDER — CLONIDINE HCL 0.1 MG/24HR TD PTWK
0.1000 mg | MEDICATED_PATCH | TRANSDERMAL | Status: DC
  Administered 2017-11-24: 0.1 mg via TRANSDERMAL
  Filled 2017-11-24: qty 1

## 2017-11-24 MED ORDER — HEPARIN SODIUM (PORCINE) 5000 UNIT/ML IJ SOLN
5000.0000 [IU] | Freq: Three times a day (TID) | INTRAMUSCULAR | Status: DC
  Administered 2017-11-24 – 2017-12-01 (×19): 5000 [IU] via SUBCUTANEOUS
  Filled 2017-11-24 (×20): qty 1

## 2017-11-24 MED ORDER — HEPARIN 1000 UNIT/ML FOR PERITONEAL DIALYSIS: 500.0000 [IU] | INTRAMUSCULAR | Status: DC | PRN

## 2017-11-24 MED ORDER — ONDANSETRON HCL 4 MG/2ML IJ SOLN
4.0000 mg | Freq: Once | INTRAMUSCULAR | Status: AC
  Administered 2017-11-24: 4 mg via INTRAVENOUS
  Filled 2017-11-24: qty 2

## 2017-11-24 MED ORDER — CLONIDINE HCL 0.2 MG PO TABS: 0.2000 mg | ORAL_TABLET | Freq: Once | ORAL | Status: DC

## 2017-11-24 MED ORDER — LABETALOL HCL 5 MG/ML IV SOLN
20.0000 mg | Freq: Once | INTRAVENOUS | Status: AC
  Administered 2017-11-24: 20 mg via INTRAVENOUS
  Filled 2017-11-24: qty 4

## 2017-11-24 MED ORDER — HEPARIN 1000 UNIT/ML FOR PERITONEAL DIALYSIS
INTRAPERITONEAL | Status: DC | PRN
  Filled 2017-11-24: qty 5000

## 2017-11-24 MED ORDER — INSULIN GLARGINE 100 UNIT/ML ~~LOC~~ SOLN
20.0000 [IU] | Freq: Every day | SUBCUTANEOUS | Status: DC
  Administered 2017-11-24 – 2017-11-26 (×3): 20 [IU] via SUBCUTANEOUS
  Filled 2017-11-24 (×3): qty 0.2

## 2017-11-24 MED ORDER — DELFLEX-LC/2.5% DEXTROSE 394 MOSM/L IP SOLN
INTRAPERITONEAL | Status: DC
  Administered 2017-11-24: 20:00:00 via INTRAPERITONEAL
  Administered 2017-11-26: 5000 mL via INTRAPERITONEAL

## 2017-11-24 NOTE — ED Notes (Signed)
DIALYSIS PT. NEED TO MONITOR OUTPUT. ORDER FOR UA. PT IS INCONTINENT AND CONFUSED. CONDOM CATH PLACE. BLADDER SCAN PRE PLACEMENT.

## 2017-11-24 NOTE — ED Notes (Signed)
ED TO INPATIENT HANDOFF REPORT  Name/Age/Gender Jared [name] 61 y.o. male  Code Status    Code Status Orders  (From admission, onward)        Start     Ordered   11/24/17 1431  Full code  Continuous     11/24/17 1431    Code Status History    Date Active Date Inactive Code Status Order ID Comments User Context   03/21/2017 16:55 03/27/2017 18:04 Full Code [id number]  Elizabeth Sauer Inpatient   03/21/2017 16:55 03/21/2017 16:55 Full Code [id number]  Cathlyn Parsons, PA-C Inpatient   03/13/2017 20:25 03/21/2017 16:38 Full Code [id number]  Lily Kocher, MD ED   12/01/2016 16:11 12/09/2016 16:03 Full Code [id number]  Bary Leriche, PA-C Inpatient   12/01/2016 16:11 12/01/2016 16:11 Full Code [id number]  Flora Lipps Inpatient   11/24/2016 21:52 12/01/2016 15:43 Full Code [id number]  Dannielle Burn, MD ED      Home/SNF/Other Home  Chief Complaint Seizures   Level of Care/Admitting Diagnosis ED Disposition    ED Disposition Condition [place]: [place] [[id number]]  Level of Care: Stepdown [14]  Diagnosis: Acute metabolic encephalopathy [1700174]  Admitting Physician: [place], [place]  Attending Physician: Debbe Odea [[id number]]  Estimated length of stay: past midnight tomorrow  Certification:: I certify this patient will need inpatient services for at least 2 midnights  PT Class (Do Not Modify): Inpatient [101]  PT Acc Code (Do Not Modify): Private [1]       Medical History Past Medical History:  Diagnosis Date  . Anemia   . Chronic kidney disease    on dialysis M-W-F  . Diabetes mellitus without complication ([place])    onset as adult  . Hypertension   . Seizures ([place])   . Stroke ([place])   . Thyroid disease    hyperparathyroidism    Allergies Allergies  Allergen Reactions  . Penicillins Other (See Comments)    Has patient had a PCN reaction causing immediate rash, facial/tongue/throat swelling, SOB or  lightheadedness with hypotension: Unk Has patient had a PCN reaction causing severe rash involving mucus membranes or skin necrosis: Unk Has patient had a PCN reaction that required hospitalization: Unk Has patient had a PCN reaction occurring within the last 10 years: Unk If all of the above answers are "NO", then may proceed with Cephalosporin use.     IV Location/Drains/Wounds Patient Lines/Drains/Airways Status   Active Line/Drains/Airways    Name:   Placement date:   Placement time:   Site:   Days:   Peripheral IV 11/24/17 Right Antecubital   11/24/17    0709    Antecubital   less than 1   Fistula / Graft Left Upper arm Arteriovenous fistula   06/04/14    0838    Upper arm   1269   External Urinary Catheter   03/14/17    0530    -   255   External Urinary Catheter   11/24/17    1050    -   less than 1   Incision (Closed) 06/04/14 Arm Left   06/04/14    9449     1269          Labs/Imaging Results for orders placed or performed during the hospital encounter of 11/24/17 (from the past 48 hour(s))  CBG monitoring, ED     Status: Abnormal   Collection Time: 11/24/17  6:46 AM  Result  Value Ref Range   Glucose-Capillary 344 (H) 65 - 99 mg/dL  CBC with Differential     Status: None   Collection Time: 11/24/17  7:00 AM  Result Value Ref Range   WBC 8.0 4.0 - 10.5 K/uL   RBC 5.11 4.22 - 5.81 MIL/uL   Hemoglobin 14.4 13.0 - 17.0 g/dL   HCT 43.3 39.0 - 52.0 %   MCV 84.7 78.0 - 100.0 fL   MCH 28.2 26.0 - 34.0 pg   MCHC 33.3 30.0 - 36.0 g/dL   RDW 14.5 11.5 - 15.5 %   Platelets 161 150 - 400 K/uL   Neutrophils Relative % 81 %   Neutro Abs 6.5 1.7 - 7.7 K/uL   Lymphocytes Relative 14 %   Lymphs Abs 1.1 0.7 - 4.0 K/uL   Monocytes Relative 5 %   Monocytes Absolute 0.4 0.1 - 1.0 K/uL   Eosinophils Relative 0 %   Eosinophils Absolute 0.0 0.0 - 0.7 K/uL   Basophils Relative 0 %   Basophils Absolute 0.0 0.0 - 0.1 K/uL  Basic metabolic panel     Status: Abnormal   Collection Time:  11/24/17  7:00 AM  Result Value Ref Range   Sodium 139 135 - 145 mmol/L   Potassium 5.9 (H) 3.5 - 5.1 mmol/L   Chloride 95 (L) 101 - 111 mmol/L   CO2 22 22 - 32 mmol/L   Glucose, Bld 345 (H) 65 - 99 mg/dL   BUN 86 (H) 6 - 20 mg/dL   Creatinine, Ser 14.27 (H) 0.61 - 1.24 mg/dL   Calcium 9.6 8.9 - 10.3 mg/dL   GFR calc non Af Amer 3 (L) >60 mL/min   GFR calc Af Amer 4 (L) >60 mL/min    Comment: (NOTE) The eGFR has been calculated using the CKD EPI equation. This calculation has not been validated in all clinical situations. eGFR's persistently <60 mL/min signify possible Chronic Kidney Disease.   Urinalysis, Complete w Microscopic     Status: Abnormal   Collection Time: 11/24/17  9:54 AM  Result Value Ref Range   Color, Urine YELLOW YELLOW   APPearance HAZY (A) CLEAR   Specific Gravity, Urine 1.010 1.005 - 1.030   pH 8.0 5.0 - 8.0   Glucose, UA >=500 (A) NEGATIVE mg/dL   Hgb urine dipstick SMALL (A) NEGATIVE   Bilirubin Urine NEGATIVE NEGATIVE   Ketones, ur 80 (A) NEGATIVE mg/dL   Protein, ur >=300 (A) NEGATIVE mg/dL   Nitrite NEGATIVE NEGATIVE   Leukocytes, UA NEGATIVE NEGATIVE   RBC / HPF 0-5 0 - 5 RBC/hpf   WBC, UA 0-5 0 - 5 WBC/hpf   Bacteria, UA RARE (A) NONE SEEN   Squamous Epithelial / LPF 0-5 (A) NONE SEEN  Hepatic function panel     Status: Abnormal   Collection Time: 11/24/17  9:54 AM  Result Value Ref Range   Total Protein 6.2 (L) 6.5 - 8.1 g/dL   Albumin 3.2 (L) 3.5 - 5.0 g/dL   AST 30 15 - 41 U/L   ALT 17 17 - 63 U/L   Alkaline Phosphatase 59 38 - 126 U/L   Total Bilirubin 1.5 (H) 0.3 - 1.2 mg/dL   Bilirubin, Direct 0.1 0.1 - 0.5 mg/dL   Indirect Bilirubin 1.4 (H) 0.3 - 0.9 mg/dL  Ammonia     Status: None   Collection Time: 11/24/17  9:54 AM  Result Value Ref Range   Ammonia 23 9 - 35 umol/L  Urine rapid  drug screen (hosp performed)     Status: None   Collection Time: 11/24/17  9:54 AM  Result Value Ref Range   Opiates NONE DETECTED NONE DETECTED    Cocaine NONE DETECTED NONE DETECTED   Benzodiazepines NONE DETECTED NONE DETECTED   Amphetamines NONE DETECTED NONE DETECTED   Tetrahydrocannabinol NONE DETECTED NONE DETECTED   Barbiturates NONE DETECTED NONE DETECTED    Comment: (NOTE) DRUG SCREEN FOR MEDICAL PURPOSES ONLY.  IF CONFIRMATION IS NEEDED FOR ANY PURPOSE, NOTIFY LAB WITHIN 5 DAYS. LOWEST DETECTABLE LIMITS FOR URINE DRUG SCREEN Drug Class                     Cutoff (ng/mL) Amphetamine and metabolites    1000 Barbiturate and metabolites    200 Benzodiazepine                 950 Tricyclics and metabolites     300 Opiates and metabolites        300 Cocaine and metabolites        300 THC                            50   Ethanol     Status: None   Collection Time: 11/24/17  9:54 AM  Result Value Ref Range   Alcohol, Ethyl (B) <10 <10 mg/dL    Comment:        LOWEST DETECTABLE LIMIT FOR SERUM ALCOHOL IS 10 mg/dL FOR MEDICAL PURPOSES ONLY   Troponin I     Status: Abnormal   Collection Time: 11/24/17  9:54 AM  Result Value Ref Range   Troponin I 0.04 (HH) <0.03 ng/mL    Comment: CRITICAL RESULT CALLED TO, READ BACK BY AND VERIFIED WITH: DOWD,P AT 11:05AM ON 11/24/17 BY FESTERMAN,C   CBG monitoring, ED     Status: Abnormal   Collection Time: 11/24/17  3:18 PM  Result Value Ref Range   Glucose-Capillary 508 (HH) 65 - 99 mg/dL   Ct Head Wo Contrast  Result Date: 11/24/2017 CLINICAL DATA:  Altered mental status. EXAM: CT HEAD WITHOUT CONTRAST TECHNIQUE: Contiguous axial images were obtained from the base of the skull through the vertex without intravenous contrast. COMPARISON:  MRI brain dated March 17, 2017. CT head dated March 13, 2017. FINDINGS: Despite efforts by the technologist and patient, motion artifact is present on today's exam and could not be eliminated. This reduces exam sensitivity and specificity. Brain: No evidence of acute infarction, hemorrhage, hydrocephalus, extra-axial collection or mass lesion/mass  effect. Stable mild cerebral atrophy. Vascular: No hyperdense vessel or unexpected calcification. Skull: Normal. Negative for fracture. Stable ground-glass, slightly expansile lesion in the anteromedial wall left maxillary sinus. Sinuses/Orbits: No acute finding. Other: None. IMPRESSION: 1. Slightly limited study due to motion artifact. No acute intracranial abnormality. Electronically Signed   By: Titus Dubin M.D.   On: 11/24/2017 10:06   Dg Chest Port 1 View  Result Date: 11/24/2017 CLINICAL DATA:  Altered mental status. EXAM: PORTABLE CHEST 1 VIEW COMPARISON:  03/14/2017 FINDINGS: Technique limitation, with exclusion of a portion of the lower chest. Normal heart size. Overlying support apparatus projecting over the lower heart. No large pleural fluid or pneumothorax. Upper lungs clear. IMPRESSION: Moderate limitations, as detailed above. Given these limitations, no acute disease. Electronically Signed   By: Abigail Miyamoto M.D.   On: 11/24/2017 10:03    Pending Labs Unresulted Labs (From admission, onward)  Start     Ordered   11/25/17 9037  Basic metabolic panel  Tomorrow morning,   R     11/24/17 1431   11/25/17 0500  CBC  Tomorrow morning,   R     11/24/17 1431   11/24/17 1430  CBC  (heparin)  Once,   R    Comments:  Baseline for heparin therapy IF NOT ALREADY DRAWN.  Notify MD if PLT < 100 K.    11/24/17 1431   11/24/17 1430  Creatinine, serum  (heparin)  Once,   R    Comments:  Baseline for heparin therapy IF NOT ALREADY DRAWN.    11/24/17 1431      Vitals/Pain Today's Vitals   11/24/17 1520 11/24/17 1530 11/24/17 1535 11/24/17 1650  BP: (!) 218/87 (!) 201/90  (!) 197/75  Pulse: 75 77 80 88  Resp: 17 (!) '26 19 18  '$ Temp:      TempSrc:      SpO2: 100% 100% 100% 99%  Weight:      Height:      PainSc:    0-No pain    Isolation Precautions No active isolations  Medications Medications  heparin injection 5,000 Units (not administered)  sodium chloride flush (NS) 0.9  % injection 3 mL (3 mLs Intravenous Given 11/24/17 1526)  sodium chloride flush (NS) 0.9 % injection 3 mL (not administered)  0.9 %  sodium chloride infusion (not administered)  insulin aspart (novoLOG) injection 0-9 Units (0 Units Subcutaneous Not Given 11/24/17 1615)  cloNIDine (CATAPRES - Dosed in mg/24 hr) patch 0.1 mg (0.1 mg Transdermal Patch Applied 11/24/17 1532)  hydrALAZINE (APRESOLINE) injection 10 mg (10 mg Intravenous Given 11/24/17 1525)  nitroGLYCERIN (NITRODUR - Dosed in mg/24 hr) patch 0.2 mg (0.2 mg Transdermal Patch Applied 11/24/17 1529)  labetalol (NORMODYNE,TRANDATE) injection 20 mg (not administered)  insulin glargine (LANTUS) injection 20 Units (20 Units Subcutaneous Given 11/24/17 1645)  ondansetron (ZOFRAN) injection 4 mg (4 mg Intravenous Given 11/24/17 0917)  labetalol (NORMODYNE,TRANDATE) injection 20 mg (20 mg Intravenous Given 11/24/17 1058)  LORazepam (ATIVAN) injection 1 mg (1 mg Intravenous Given 11/24/17 1123)  LORazepam (ATIVAN) injection 1 mg (1 mg Intravenous Given 11/24/17 1612)  insulin aspart (novoLOG) injection 15 Units (15 Units Subcutaneous Given 11/24/17 1613)  LORazepam (ATIVAN) injection 1 mg (1 mg Intravenous Given 11/24/17 1645)    Mobility walks

## 2017-11-24 NOTE — Progress Notes (Signed)
PD tx initiated via tenckhoff w/o problem, VSS, report given to Aquilla Solian, RN

## 2017-11-24 NOTE — ED Notes (Signed)
Bed: WA06 Expected date:  Expected time:  Means of arrival:  Comments: 

## 2017-11-24 NOTE — Consult Note (Signed)
Renal Service Consult Note [facility]  Ashrith Sagan 11/24/2017 Sol [place]azing Requesting Physician:  Dr Wynelle Cleveland  Reason for Consult:  ESRD pt with altered mental status HPI: The patient is a 61 y.o. year-old presenting to ED from home for confusion.  Has been having some abd pain/ nausea/ vomiting for the last few days according to ED notes.  Missed his PD last night due to these issues.  In ED head CT was negative, UDS negative, UA wnl, K 5.9, BUN 80's, creat 14.  Asked to see for ESRD.   Pt is very much altered, won't respond, asleep on his side, doesn't follow commands, not agitated , somnolent.    Pts wife is here, says he has been having some N/V for several days.  She knows he missed PD last night, but he does his PD in a separate room and so she doesn't observe him doing his PD and doesn't know if he may have missed other PD sessions recently or not.  Per the wife at baseline is fully active and takes care of himself, no walker/ cane, no assist for ADL's.  No seizure like activity or jerking per fmaily.   Old chart: - Jan 2018 w/ seizures and uncont DM, started on Keppra. Peritoneal dialysis pt (started HD in 2015, switched to PD in 2016).  HTN.  - April 2018 w/ seizure like activity, AMS and slurred speech. In hospital had temp 103, MRI/ MRA negative, LP couldn't be done d/t agitation. CT abd/ pelv negative. Slowly improved daily w/ regards to global aphasia.  IV abx were eventually dc'd per ID rec's.  uncont DM rx with insulin. Malig HTN treated IV/ po meds and imrpoved. Neuro put pt on Keppra for seizure like activity in the hospital.   Martin Majestic to rehab after dc.       ROS  n/a   Past Medical History  Past Medical History:  Diagnosis Date  . Anemia   . Chronic kidney disease    on dialysis M-W-F  . Diabetes mellitus without complication ([place])    onset as adult  . Hypertension   . Seizures ([place])   . Stroke ([place])   . Thyroid disease     hyperparathyroidism   Past Surgical History  Past Surgical History:  Procedure Laterality Date  . AV FISTULA PLACEMENT Left 06/04/2014   Procedure: ARTERIOVENOUS (AV) FISTULA CREATION-  BRACHIOCEPHALIC WITH LIGATION OF COMPETEING BRANCH;  Surgeon: Mal Misty, MD;  Location: [place];  Service: Vascular;  Laterality: Left;  . INSERTION OF DIALYSIS CATHETER  04/2014  . IR GENERIC HISTORICAL Left 11/30/2016   IR THROMBECTOMY AV FISTULA W/THROMBOLYSIS/PTA/STENT INC/SHUNT/IMG LT 11/30/2016 Markus Daft, MD [facility]  . IR GENERIC HISTORICAL  11/30/2016   IR US GUIDE VASC ACCESS LEFT 11/30/2016 Markus Daft, MD [facility]   Family History  Family History  Problem Relation Age of Onset  . Cancer - Lung Mother   . Cancer - Other Father   . Diabetes Neg Hx    Social History  reports that he quit smoking about 33 years ago. he has never used smokeless tobacco. He reports that he drinks about 0.6 oz of alcohol per week. He reports that he does not use drugs. Allergies  Allergies  Allergen Reactions  . Penicillins Other (See Comments)    Has patient had a PCN reaction causing immediate rash, facial/tongue/throat swelling, SOB or lightheadedness with hypotension: Unk Has patient had a PCN reaction causing severe rash involving  mucus membranes or skin necrosis: Unk Has patient had a PCN reaction that required hospitalization: Unk Has patient had a PCN reaction occurring within the last 10 years: Unk If all of the above answers are "NO", then may proceed with Cephalosporin use.    Home medications Prior to Admission medications   Medication Sig Start Date End Date Taking? Authorizing Provider  atorvastatin (LIPITOR) 20 MG tablet Take 1 tablet (20 mg total) by mouth daily. 03/27/17  Yes Angiulli, Lavon Paganini, PA-C  cloNIDine (CATAPRES) 0.1 MG tablet Take 0.2 mg by mouth 2 (two) times daily.    Yes [provider]  hydrALAZINE (APRESOLINE) 100 MG tablet Take 1 tablet (100 mg total) by mouth 3 (three)  times daily. 03/27/17  Yes Angiulli, Lavon Paganini, PA-C  insulin NPH Human (NOVOLIN N) 100 UNIT/ML injection Inject 20 units every morning Patient taking differently: Inject 30 Units into the skin daily before breakfast.  04/03/17  Yes Renato Shin, MD  isosorbide mononitrate (IMDUR) 30 MG 24 hr tablet TAKE 1 TABLET BY MOUTH ONCE DAILY 07/26/17  Yes Henson, Vickie L, NP-C  metoprolol tartrate (LOPRESSOR) 100 MG tablet Take 1 tablet (100 mg total) by mouth 2 (two) times daily. 05/21/17  Yes Henson, Vickie L, NP-C  multivitamin (RENA-VIT) TABS tablet Take 1 tablet by mouth daily.   Yes [provider]  NIFEdipine (PROCARDIA XL/ADALAT-CC) 90 MG 24 hr tablet Take 1 tablet (90 mg total) by mouth daily. 03/27/17  Yes Angiulli, Lavon Paganini, PA-C  pantoprazole (PROTONIX) 40 MG tablet Take 1 tablet (40 mg total) by mouth daily at 6 (six) AM. 05/21/17  Yes Henson, Vickie L, NP-C  sevelamer carbonate (RENVELA) 800 MG tablet Take 3 tablets (2,400 mg total) by mouth 3 (three) times daily with meals. 03/27/17  Yes Angiulli, Lavon Paganini, PA-C  levETIRAcetam (KEPPRA) 250 MG tablet Take 1 tablet (250 mg total) by mouth daily. 08/24/17   [name], MD   Liver Function Tests Recent Labs  Lab 11/24/17 0954  AST 30  ALT 17  ALKPHOS 59  BILITOT 1.5*  PROT 6.2*  ALBUMIN 3.2*   No results for input(s): LIPASE, AMYLASE in the last 168 hours. CBC Recent Labs  Lab 11/24/17 0700  WBC 8.0  NEUTROABS 6.5  HGB 14.4  HCT 43.3  MCV 84.7  PLT 951   Basic Metabolic Panel Recent Labs  Lab 11/24/17 0700  NA 139  K 5.9*  CL 95*  CO2 22  GLUCOSE 345*  BUN 86*  CREATININE 14.27*  CALCIUM 9.6   Iron/TIBC/Ferritin/ %Sat    Component Value Date/Time   IRON 45 03/26/2017 1255   TIBC 193 (L) 03/26/2017 1255   IRONPCTSAT 23 03/26/2017 1255    Vitals:   11/24/17 1535 11/24/17 1650 11/24/17 1746 11/24/17 1800  BP:  (!) 197/75 (!) 189/108 (!) 189/109  Pulse: 80 88 (!) 20 97  Resp: 19 18 (!) 21 20  Temp:       TempSrc:      SpO2: 100% 99% 98% 95%  Weight:      Height:       Exam Gen obtunded, not responding, not in distress No rash, cyanosis or gangrene Sclera anicteric, throat clear  No jvd or bruits Chest clear bilat RRR no MRG Abd soft ntnd no mass or ascites +bs, PD cath intact GU normal male MS no joint effusions or deformity Ext no LE edema / no wounds or ulcers Neuro is somnolent, not following any commands  Home meds: -clonidine 0.1 bid/ hydralazine 100 tid/ metoprolol 100 bid/ procardia XL 90 qd -keppra 250 qd -PPI/ renvela ac/ MVI/ Imdur/ lipitor -NPH insulin 20 qam  Dialysis: CCPD 74kg  6 exchanges total, fill volume 2500, dwell time 1h 73min, last fill 2000, no pause  Na 139  K 5.9  BUN 86  Cr 14.27  Impression: 1. Altered mental status - BUN/ creat up some , but not severely.  Suspect something other than uremia is going on. Has low grade fever.  2. 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 Uncont HTN - get vol down w PD and use topical / IV BP meds for now 4. 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 Low grade fever - per primary. Will send PD fluid for cell count/ culture 6. DM2 - on insulin   Plan - plan CCPD tonight, all 2.5%  Kelly Splinter MD [facility] pager [phone]   11/24/2017, 6:35 PM

## 2017-11-24 NOTE — ED Notes (Signed)
PT RETURNED. PT DID NOT COMPLETE MRI.

## 2017-11-24 NOTE — ED Notes (Signed)
PER ADMITTING MD, BMET AT 8PM. CALL NIGHT PROVIDER.

## 2017-11-24 NOTE — ED Notes (Signed)
ED Provider at bedside. [place]

## 2017-11-24 NOTE — ED Notes (Signed)
MULTIPLE ATTEMPTS TO COLLECT BLOOD FROM IV. IV X 2 FLUSH HOWEVER BLOOD DOES NOT DRAW BACK. PT AGITATED WITH CARE. EDP AWARE. WILL START ADDITIONAL LINE UPON RETURN FROM CT

## 2017-11-24 NOTE — ED Triage Notes (Addendum)
BIB EMS from Home, pt found in lethargic state by family this AM. Family suspects seizure activity but actual seizure was not witnessed. No oral trauma noted. Pt on home dialysis every night and was unable to do so yesterday.   BP 261/128 P 84 RR 16 GCS 13 CBG 397

## 2017-11-24 NOTE — ED Notes (Signed)
MRI RETURNED PT. UPDATED ON HOPITALIST REQUEST. ADDITIONAL 1MG  ATIVAN WILL BE GIVEN AND WILL ATTEMPT MRI AGAIN

## 2017-11-24 NOTE — ED Notes (Signed)
PT LEVEL OF CARE IS TWO STAFF TO ONE PT. PT IS VERY RESTLESS AND AGITATED, CONFUSED. EDP [place] AND FAMILY AWARE. CARDIAC MONITOR IN PLACE. CONDOM CATH IN PLACE. VITAL SIGN EQUIPMENT IN PLACE. REQUESTING FAMILY TO CONTINUE TO ESTABLISH COMFORT AND ASSIST WITH RE ORIENTATION AND DISSIMULATION FOR THIS PT

## 2017-11-24 NOTE — H&P (Addendum)
History and Physical    Jared [name] UXL:[id number] DOB: 11/04/1956 DOA: 11/24/2017    PCP: Girtha Rm, NP-C  Patient coming from: home  Chief Complaint: lethargy  HPI: Jared [name] is a 61 y.o. male with medical history of peritoneal dialysis, DM, HTN, seizures presents with confusion. His sister gives the history. She lives with him and has noted him complain of abdominal discomfort and with a poor appetite for about 4-5 days now. He has had some nausea. She states it is not uncommon for him to have GI issues like this. Yesterday, she noted at about 4 AM, he was quite confused. He did not know who she was. She states he skipped his dialysis yesterday evening which he does not usually do. He is generally quite independent and takes reasonably good care of himself.  In the ER, he has been confused and agitated and currently is calm after Ativan.   ED Course:  BP 240/125 K 5.9 Cr 14 Given Labetalol for HTN and Ativan for agitation - head CT negative  Review of Systems:  All other systems reviewed and apart from HPI, are negative.  Past Medical History:  Diagnosis Date  . Anemia   . Chronic kidney disease    on dialysis M-W-F  . Diabetes mellitus without complication ([place])    onset as adult  . Hypertension   . Seizures ([place])   . Stroke ([place])   . Thyroid disease    hyperparathyroidism    Past Surgical History:  Procedure Laterality Date  . AV FISTULA PLACEMENT Left 06/04/2014   Procedure: ARTERIOVENOUS (AV) FISTULA CREATION-  BRACHIOCEPHALIC WITH LIGATION OF COMPETEING BRANCH;  Surgeon: Mal Misty, MD;  Location: [place];  Service: Vascular;  Laterality: Left;  . INSERTION OF DIALYSIS CATHETER  04/2014  . IR GENERIC HISTORICAL Left 11/30/2016   IR THROMBECTOMY AV FISTULA W/THROMBOLYSIS/PTA/STENT INC/SHUNT/IMG LT 11/30/2016 Markus Daft, MD [facility]  . IR GENERIC HISTORICAL  11/30/2016   IR US GUIDE VASC ACCESS LEFT 11/30/2016 Markus Daft, MD [facility]    Social  History:   reports that he quit smoking about 33 years ago. he has never used smokeless tobacco. He reports that he drinks about 0.6 oz of alcohol per week. He reports that he does not use drugs.  Allergies  Allergen Reactions  . Penicillins Other (See Comments)    Has patient had a PCN reaction causing immediate rash, facial/tongue/throat swelling, SOB or lightheadedness with hypotension: Unk Has patient had a PCN reaction causing severe rash involving mucus membranes or skin necrosis: Unk Has patient had a PCN reaction that required hospitalization: Unk Has patient had a PCN reaction occurring within the last 10 years: Unk If all of the above answers are "NO", then may proceed with Cephalosporin use.     Family History  Problem Relation Age of Onset  . Cancer - Lung Mother   . Cancer - Other Father   . Diabetes Neg Hx      Prior to Admission medications   Medication Sig Start Date End Date Taking? Authorizing Provider  atorvastatin (LIPITOR) 20 MG tablet Take 1 tablet (20 mg total) by mouth daily. 03/27/17  Yes Angiulli, Lavon Paganini, PA-C  cloNIDine (CATAPRES) 0.1 MG tablet Take 0.2 mg by mouth 2 (two) times daily.    Yes [provider]  hydrALAZINE (APRESOLINE) 100 MG tablet Take 1 tablet (100 mg total) by mouth 3 (three) times daily. 03/27/17  Yes Angiulli, Lavon Paganini, PA-C  insulin NPH  Human (NOVOLIN N) 100 UNIT/ML injection Inject 20 units every morning Patient taking differently: Inject 30 Units into the skin daily before breakfast.  04/03/17  Yes Renato Shin, MD  isosorbide mononitrate (IMDUR) 30 MG 24 hr tablet TAKE 1 TABLET BY MOUTH ONCE DAILY 07/26/17  Yes Henson, Vickie L, NP-C  metoprolol tartrate (LOPRESSOR) 100 MG tablet Take 1 tablet (100 mg total) by mouth 2 (two) times daily. 05/21/17  Yes Henson, Vickie L, NP-C  multivitamin (RENA-VIT) TABS tablet Take 1 tablet by mouth daily.   Yes [provider]  NIFEdipine (PROCARDIA XL/ADALAT-CC) 90 MG 24 hr tablet Take  1 tablet (90 mg total) by mouth daily. 03/27/17  Yes Angiulli, Lavon Paganini, PA-C  pantoprazole (PROTONIX) 40 MG tablet Take 1 tablet (40 mg total) by mouth daily at 6 (six) AM. 05/21/17  Yes Henson, Vickie L, NP-C  sevelamer carbonate (RENVELA) 800 MG tablet Take 3 tablets (2,400 mg total) by mouth 3 (three) times daily with meals. 03/27/17  Yes Angiulli, Lavon Paganini, PA-C  levETIRAcetam (KEPPRA) 250 MG tablet Take 1 tablet (250 mg total) by mouth daily. 08/24/17   [name], MD    Physical Exam: Wt Readings from Last 3 Encounters:  11/24/17 70.3 kg (155 lb)  07/25/17 77 kg (169 lb 12.8 oz)  05/04/17 71.1 kg (156 lb 12.8 oz)   Vitals:   11/24/17 1200 11/24/17 1202 11/24/17 1230 11/24/17 1328  BP: (!) 204/88  (!) 212/89 (!) 186/103  Pulse:  78 85 86  Resp: (!) 22 14 18  (!) 21  Temp:      TempSrc:      SpO2:  100% 99% 100%  Weight:      Height:          Constitutional: NAD, calm, comfortable Eyes: PERTLA, lids and conjunctivae normal ENMT: Mucous membranes are moist. Posterior pharynx clear of any exudate or lesions. Normal dentition.  Neck: normal, supple, no masses, no thyromegaly Respiratory: clear to auscultation bilaterally, no wheezing, no crackles. Normal respiratory effort. No accessory muscle use.  Cardiovascular: S1 & S2 heard, regular rate and rhythm, no murmurs / rubs / gallops. No extremity edema. 2+ pedal pulses. No carotid bruits.  Abdomen: No distension, no tenderness, no masses palpated. No hepatosplenomegaly. Bowel sounds normal.  Musculoskeletal: no clubbing / cyanosis. No joint deformity upper and lower extremities. Good ROM, no contractures. Normal muscle tone.  Skin: no rashes, lesions, ulcers. No induration Neurologic: CN 2-12 grossly intact. Sensation intact, DTR normal. Strength 5/5 in all 4 limbs.  Psychiatric: Normal judgment and insight. Alert and oriented x 3. Normal mood.     Labs on Admission: I have personally reviewed following labs and imaging  studies  CBC: Recent Labs  Lab 11/24/17 0700  WBC 8.0  NEUTROABS 6.5  HGB 14.4  HCT 43.3  MCV 84.7  PLT 540   Basic Metabolic Panel: Recent Labs  Lab 11/24/17 0700  NA 139  K 5.9*  CL 95*  CO2 22  GLUCOSE 345*  BUN 86*  CREATININE 14.27*  CALCIUM 9.6   GFR: Estimated Creatinine Clearance: 4.8 mL/min (A) (by C-G formula based on SCr of 14.27 mg/dL (H)). Liver Function Tests: Recent Labs  Lab 11/24/17 0954  AST 30  ALT 17  ALKPHOS 59  BILITOT 1.5*  PROT 6.2*  ALBUMIN 3.2*   No results for input(s): LIPASE, AMYLASE in the last 168 hours. Recent Labs  Lab 11/24/17 0954  AMMONIA 23   Coagulation Profile: No results for input(s):  INR, PROTIME in the last 168 hours. Cardiac Enzymes: Recent Labs  Lab 11/24/17 0954  TROPONINI 0.04*   BNP (last 3 results) No results for input(s): PROBNP in the last 8760 hours. HbA1C: No results for input(s): HGBA1C in the last 72 hours. CBG: Recent Labs  Lab 11/24/17 0646  GLUCAP 344*   Lipid Profile: No results for input(s): CHOL, HDL, LDLCALC, TRIG, CHOLHDL, LDLDIRECT in the last 72 hours. Thyroid Function Tests: No results for input(s): TSH, T4TOTAL, FREET4, T3FREE, THYROIDAB in the last 72 hours. Anemia Panel: No results for input(s): VITAMINB12, FOLATE, FERRITIN, TIBC, IRON, RETICCTPCT in the last 72 hours. Urine analysis:    Component Value Date/Time   COLORURINE YELLOW 11/24/2017 0954   APPEARANCEUR HAZY (A) 11/24/2017 0954   LABSPEC 1.010 11/24/2017 0954   PHURINE 8.0 11/24/2017 0954   GLUCOSEU >=500 (A) 11/24/2017 0954   HGBUR SMALL (A) 11/24/2017 0954   BILIRUBINUR NEGATIVE 11/24/2017 0954   KETONESUR 80 (A) 11/24/2017 0954   PROTEINUR >=300 (A) 11/24/2017 0954   NITRITE NEGATIVE 11/24/2017 0954   LEUKOCYTESUR NEGATIVE 11/24/2017 0954   Sepsis Labs: @LABRCNTIP (procalcitonin:4,lacticidven:4) )No results found for this or any previous visit (from the past 240 hour(s)).   Radiological Exams on  Admission: Ct Head Wo Contrast  Result Date: 11/24/2017 CLINICAL DATA:  Altered mental status. EXAM: CT HEAD WITHOUT CONTRAST TECHNIQUE: Contiguous axial images were obtained from the base of the skull through the vertex without intravenous contrast. COMPARISON:  MRI brain dated March 17, 2017. CT head dated March 13, 2017. FINDINGS: Despite efforts by the technologist and patient, motion artifact is present on today's exam and could not be eliminated. This reduces exam sensitivity and specificity. Brain: No evidence of acute infarction, hemorrhage, hydrocephalus, extra-axial collection or mass lesion/mass effect. Stable mild cerebral atrophy. Vascular: No hyperdense vessel or unexpected calcification. Skull: Normal. Negative for fracture. Stable ground-glass, slightly expansile lesion in the anteromedial wall left maxillary sinus. Sinuses/Orbits: No acute finding. Other: None. IMPRESSION: 1. Slightly limited study due to motion artifact. No acute intracranial abnormality. Electronically Signed   By: Titus Dubin M.D.   On: 11/24/2017 10:06   Dg Chest Port 1 View  Result Date: 11/24/2017 CLINICAL DATA:  Altered mental status. EXAM: PORTABLE CHEST 1 VIEW COMPARISON:  03/14/2017 FINDINGS: Technique limitation, with exclusion of a portion of the lower chest. Normal heart size. Overlying support apparatus projecting over the lower heart. No large pleural fluid or pneumothorax. Upper lungs clear. IMPRESSION: Moderate limitations, as detailed above. Given these limitations, no acute disease. Electronically Signed   By: Abigail Miyamoto M.D.   On: 11/24/2017 10:03       Assessment/Plan Principal Problem:   Metabolic encephalopathy - ammonia level is normal, CT head is norma - presented with confusion last spring as well and had a glucose > 1000l - per d/c summary, he was noted to have 2 seizure like episodes in the hospital and started on Keppra. Keppra was recently discontinued by his PCP as he has been  seizure free since then - ? If he had a seizure and is post ictal - UDS negative - other differential includes inadequate dialysis - needs to be [facility] for dialysis - ? CVA-  he will need an MRI of the brain to r/o CVA- I have ordered this- he will likely need more Ativan for this as he is very restless and agitated when moved by nursing staff - sitter at bedside- no further sedation to be given in  order to appropriately assess mental status  Active Problems: Hypertensive urgency - SBP in 200s - takes a number of medications at home which he has not had today - as he is currently lethargic, will need to replace his meds via IV and transdermal routes - he does take Clonidine and we may be seeing some rebound HTN - Clonidine- hold PO and place patch - Hydralzine- change to IV QID - Imdur- Place Nitro patch - add PRN labetalol  Mildly elevated T bili - repeat tomorrow  -once less agitated, will need imaging if still elevated   Mild Troponin elevation - 0.04- not sure why this was ordered in a patient who has not had any chest pain- no need to follow    CKD (chronic kidney disease) stage V requiring chronic dialysis  - does peritoneal dialysis at home- transfer [facility] dialysis    Anemia of chronic disease - stable     Type 2 diabetes mellitus with peripheral neuropathy ([place])  - place on SSI     DVT prophylaxis: Heparin  Code Status: Full code  Family Communication: sisters  Disposition Plan:   Consults called: nephro  Admission status: admit    Debbe Odea MD [facility] Pager: [email or web address] Password [email or web address] 7PM-7AM, please contact night-coverage   11/24/2017, 2:15 PM

## 2017-11-24 NOTE — ED Notes (Signed)
Patient transported to CT 

## 2017-11-24 NOTE — ED Notes (Signed)
CBG 508

## 2017-11-24 NOTE — ED Notes (Signed)
Patient transported to MRI 

## 2017-11-24 NOTE — ED Notes (Signed)
MRI CALLED FOR SCAN.

## 2017-11-24 NOTE — ED Notes (Signed)
Bed: SY54 Expected date:  Expected time:  Means of arrival:  Comments: rm 6

## 2017-11-24 NOTE — Progress Notes (Signed)
Pt blood pressures remain elevated.  Pt given scheduled hydralazine, PRN labetolol with BP 210/150 manually.  Jeannette Corpus, NP notified.  Orders received.

## 2017-11-24 NOTE — ED Provider Notes (Signed)
[facility] DEPT Provider Note   CSN: [id number] Arrival date & time: 11/24/17  8786   LEVEL 5 CAVEAT - ALTERED MENTAL STATUS  History   Chief Complaint Chief Complaint  Patient presents with  . Seizures    HPI Jovoni Borkenhagen is a 61 y.o. male.  HPI  61 year old male with a history of diabetes, chronic kidney disease on peritoneal dialysis and history of seizure-like activity presents with altered mental status.  History is taken from the sisters at the bedside.  Over the last few days, starting 12/25, the patient has not been feeling well.  He has complained of some on and off abdominal pain which she has frequently.  He thinks he has a history of acid reflux.  He has not been eating and drinking as much despite encouragement from the sisters.  However this morning when checking on the patient, he seemed confused and did not know his name when asked.  They did not see any seizure-like activity.  He has had 2 prior episodes of encephalopathy and this is similar.  Thus they called EMS and he was brought here.  No known fevers.  He has a chronic cough but this is not changed.  No other specific complaints.  When asked the patient if he has any complaints he denies this.  Denies headaches.  Patient has vomited once in between arriving and me seeing the patient.  Past Medical History:  Diagnosis Date  . Anemia   . Chronic kidney disease    on dialysis M-W-F  . Diabetes mellitus without complication ([place])    onset as adult  . Hypertension   . Seizures ([place])   . Stroke ([place])   . Thyroid disease    hyperparathyroidism    Patient Active Problem List   Diagnosis Date Noted  . Acute metabolic encephalopathy 76/72/0947  . Metabolic encephalopathy 09/62/8366  . Encephalopathy   . Type 2 diabetes mellitus with peripheral neuropathy (HCC)   . History of prostate cancer   . Acute blood loss anemia   . Witnessed seizure-like activity ([place])   . Weakness   .  Prostate cancer ([place]) 03/13/2017  . Hyperglycemia 03/13/2017  . Accelerated hypertension 03/13/2017  . Elevated LDL cholesterol level 01/24/2017  . ESRD on dialysis ([place])   . Anemia of chronic disease   . Abdominal pain 12/08/2016  . Enlarged prostate 12/08/2016  . 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 Hypertension   . CKD (chronic kidney disease) stage V requiring chronic dialysis ([place])   . Seizure ([place]) 11/24/2016  . Status epilepticus [facility])     Past Surgical History:  Procedure Laterality Date  . AV FISTULA PLACEMENT Left 06/04/2014   Procedure: ARTERIOVENOUS (AV) FISTULA CREATION-  BRACHIOCEPHALIC WITH LIGATION OF COMPETEING BRANCH;  Surgeon: Mal Misty, MD;  Location: [facility];  Service: Vascular;  Laterality: Left;  . INSERTION OF DIALYSIS CATHETER  04/2014  . IR GENERIC HISTORICAL Left 11/30/2016   IR THROMBECTOMY AV FISTULA W/THROMBOLYSIS/PTA/STENT INC/SHUNT/IMG LT 11/30/2016 Markus Daft, MD [facility]-INTERV RAD  . IR GENERIC HISTORICAL  11/30/2016   IR US GUIDE VASC ACCESS LEFT 11/30/2016 Markus Daft, MD [facility]INTER[facility]       Home Medications    Prior to Admission medications   Medication Sig Start Date End Date Taking? Authorizing Provider  atorvastatin (LIPITOR) 20 MG tablet Take 1 tablet (20 mg total) by mouth daily. 03/27/17  Yes Angiulli, Lavon Paganini, PA-C  cloNIDine (CATAPRES) 0.1 MG tablet Take 0.2 mg by  mouth 2 (two) times daily.    Yes [provider]  hydrALAZINE (APRESOLINE) 100 MG tablet Take 1 tablet (100 mg total) by mouth 3 (three) times daily. 03/27/17  Yes Angiulli, Lavon Paganini, PA-C  insulin NPH Human (NOVOLIN N) 100 UNIT/ML injection Inject 20 units every morning Patient taking differently: Inject 30 Units into the skin daily before breakfast.  04/03/17  Yes Renato Shin, MD  isosorbide mononitrate (IMDUR) 30 MG 24 hr tablet TAKE 1 TABLET BY MOUTH ONCE DAILY 07/26/17  Yes Henson, Vickie L, NP-C  metoprolol tartrate (LOPRESSOR) 100 MG tablet Take 1 tablet (100 mg total) by mouth 2  (two) times daily. 05/21/17  Yes Henson, Vickie L, NP-C  multivitamin (RENA-VIT) TABS tablet Take 1 tablet by mouth daily.   Yes [provider]  NIFEdipine (PROCARDIA XL/ADALAT-CC) 90 MG 24 hr tablet Take 1 tablet (90 mg total) by mouth daily. 03/27/17  Yes Angiulli, Lavon Paganini, PA-C  pantoprazole (PROTONIX) 40 MG tablet Take 1 tablet (40 mg total) by mouth daily at 6 (six) AM. 05/21/17  Yes Henson, Vickie L, NP-C  sevelamer carbonate (RENVELA) 800 MG tablet Take 3 tablets (2,400 mg total) by mouth 3 (three) times daily with meals. 03/27/17  Yes Angiulli, Lavon Paganini, PA-C  levETIRAcetam (KEPPRA) 250 MG tablet Take 1 tablet (250 mg total) by mouth daily. 08/24/17   [name], MD    Family History Family History  Problem Relation Age of Onset  . Cancer - Lung Mother   . Cancer - Other Father   . Diabetes Neg Hx     Social History Social History   Tobacco Use  . Smoking status: Former Smoker    Last attempt to quit: 05/11/1984    Years since quitting: 33.5  . Smokeless tobacco: Never Used  Substance Use Topics  . Alcohol use: Yes    Alcohol/week: 0.6 oz    Types: 1 Glasses of wine per week    Comment: occasional  . Drug use: No     Allergies   Penicillins   Review of Systems Review of Systems  Unable to perform ROS: Mental status change     Physical Exam Updated Vital Signs BP (!) 201/90   Pulse 80   Temp 99.8 F (37.7 C) (Rectal)   Resp 19   Ht 5\' 6"  (1.676 m)   Wt 70.3 kg (155 lb)   SpO2 100%   BMI 25.02 kg/m   Physical Exam  Constitutional: He appears well-developed and well-nourished. No distress.  HENT:  Head: Normocephalic and atraumatic.  Right Ear: External ear normal.  Left Ear: External ear normal.  Nose: Nose normal.  Eyes: Pupils are equal, round, and reactive to light. Right eye exhibits no discharge. Left eye exhibits no discharge.  Neck: Neck supple.  Cardiovascular: Normal rate, regular rhythm and normal heart sounds.    Pulmonary/Chest: Effort normal and breath sounds normal.  Abdominal: Soft. He exhibits no distension. There is no tenderness.  Musculoskeletal: He exhibits no edema.  Neurological: He is alert. He is disoriented.  Awake, alert, but confused. Tells me name but doesn't answer many questions. No facial droop or slurred speech when talking. 5/5 BUE strength. Moves lower extremities equally but not to command  Skin: Skin is warm and dry. He is not diaphoretic.  Nursing note and vitals reviewed.    ED Treatments / Results  Labs (all labs ordered are listed, but only abnormal results are displayed) Labs Reviewed  BASIC METABOLIC PANEL -  Abnormal; Notable for the following components:      Result Value   Potassium 5.9 (*)    Chloride 95 (*)    Glucose, Bld 345 (*)    BUN 86 (*)    Creatinine, Ser 14.27 (*)    GFR calc non Af Amer 3 (*)    GFR calc Af Amer 4 (*)    All other components within normal limits  URINALYSIS, COMPLETE (UACMP) WITH MICROSCOPIC - Abnormal; Notable for the following components:   APPearance HAZY (*)    Glucose, UA >=500 (*)    Hgb urine dipstick SMALL (*)    Ketones, ur 80 (*)    Protein, ur >=300 (*)    Bacteria, UA RARE (*)    Squamous Epithelial / LPF 0-5 (*)    All other components within normal limits  HEPATIC FUNCTION PANEL - Abnormal; Notable for the following components:   Total Protein 6.2 (*)    Albumin 3.2 (*)    Total Bilirubin 1.5 (*)    Indirect Bilirubin 1.4 (*)    All other components within normal limits  TROPONIN I - Abnormal; Notable for the following components:   Troponin I 0.04 (*)    All other components within normal limits  CBG MONITORING, ED - Abnormal; Notable for the following components:   Glucose-Capillary 344 (*)    All other components within normal limits  CBG MONITORING, ED - Abnormal; Notable for the following components:   Glucose-Capillary 508 (*)    All other components within normal limits  CBC WITH  DIFFERENTIAL/PLATELET  AMMONIA  RAPID URINE DRUG SCREEN, HOSP PERFORMED  ETHANOL  CBC  CREATININE, SERUM    EKG  EKG Interpretation  Date/Time:  Saturday November 24 2017 10:09:24 EST Ventricular Rate:  80 PR Interval:    QRS Duration: 89 QT Interval:  407 QTC Calculation: 470 R Axis:   -46 Text Interpretation:  Sinus rhythm Left anterior fascicular block Borderline repolarization abnormality no significant change since April 2018 Confirmed by Sherwood Gambler [phone]) on 11/24/2017 10:13:19 AM       Radiology Ct Head Wo Contrast  Result Date: 11/24/2017 CLINICAL DATA:  Altered mental status. EXAM: CT HEAD WITHOUT CONTRAST TECHNIQUE: Contiguous axial images were obtained from the base of the skull through the vertex without intravenous contrast. COMPARISON:  MRI brain dated March 17, 2017. CT head dated March 13, 2017. FINDINGS: Despite efforts by the technologist and patient, motion artifact is present on today's exam and could not be eliminated. This reduces exam sensitivity and specificity. Brain: No evidence of acute infarction, hemorrhage, hydrocephalus, extra-axial collection or mass lesion/mass effect. Stable mild cerebral atrophy. Vascular: No hyperdense vessel or unexpected calcification. Skull: Normal. Negative for fracture. Stable ground-glass, slightly expansile lesion in the anteromedial wall left maxillary sinus. Sinuses/Orbits: No acute finding. Other: None. IMPRESSION: 1. Slightly limited study due to motion artifact. No acute intracranial abnormality. Electronically Signed   By: Titus Dubin M.D.   On: 11/24/2017 10:06   Dg Chest Port 1 View  Result Date: 11/24/2017 CLINICAL DATA:  Altered mental status. EXAM: PORTABLE CHEST 1 VIEW COMPARISON:  03/14/2017 FINDINGS: Technique limitation, with exclusion of a portion of the lower chest. Normal heart size. Overlying support apparatus projecting over the lower heart. No large pleural fluid or pneumothorax. Upper lungs  clear. IMPRESSION: Moderate limitations, as detailed above. Given these limitations, no acute disease. Electronically Signed   By: Abigail Miyamoto M.D.   On: 11/24/2017 10:03    Procedures  Procedures (including critical care time)  Medications Ordered in ED Medications  heparin injection 5,000 Units (not administered)  sodium chloride flush (NS) 0.9 % injection 3 mL (3 mLs Intravenous Given 11/24/17 1526)  sodium chloride flush (NS) 0.9 % injection 3 mL (not administered)  0.9 %  sodium chloride infusion (not administered)  insulin aspart (novoLOG) injection 0-9 Units (0 Units Subcutaneous Not Given 11/24/17 1615)  cloNIDine (CATAPRES - Dosed in mg/24 hr) patch 0.1 mg (0.1 mg Transdermal Patch Applied 11/24/17 1532)  hydrALAZINE (APRESOLINE) injection 10 mg (10 mg Intravenous Given 11/24/17 1525)  nitroGLYCERIN (NITRODUR - Dosed in mg/24 hr) patch 0.2 mg (0.2 mg Transdermal Patch Applied 11/24/17 1529)  labetalol (NORMODYNE,TRANDATE) injection 20 mg (not administered)  insulin glargine (LANTUS) injection 20 Units (not administered)  ondansetron (ZOFRAN) injection 4 mg (4 mg Intravenous Given 11/24/17 0917)  labetalol (NORMODYNE,TRANDATE) injection 20 mg (20 mg Intravenous Given 11/24/17 1058)  LORazepam (ATIVAN) injection 1 mg (1 mg Intravenous Given 11/24/17 1123)  LORazepam (ATIVAN) injection 1 mg (1 mg Intravenous Given 11/24/17 1612)  insulin aspart (novoLOG) injection 15 Units (15 Units Subcutaneous Given 11/24/17 1613)  LORazepam (ATIVAN) injection 1 mg (1 mg Intravenous Given 11/24/17 1645)     Initial Impression / Assessment and Plan / ED Course  I have reviewed the triage vital signs and the nursing notes.  Pertinent labs & imaging results that were available during my care of the patient were reviewed by me and considered in my medical decision making (see chart for details).     There are multiple possible etiologies for his altered mental status.  This could be hypertensive  in nature, although when his blood pressure was improved with IV labetalol there was no significant change in his mental status.  Given the significantly elevated BUN from baseline of 86, this could be uremia.  He will need to restart his peritoneal dialysis.  He does not appear to have an acute infection.  His CT head is unremarkable.  He was given a small dose of Ativan for some intermittent agitation when trying to get lab work and urine.  Otherwise, he appears stable for hospitalist admission.  I did discuss with nephrology who will help him dialyze when he gets over to [facility].  Final Clinical Impressions(s) / ED Diagnoses   Final diagnoses:  Altered mental status, unspecified altered mental status type    ED Discharge Orders    None       Sherwood Gambler, MD 11/24/17 [phone]

## 2017-11-25 ENCOUNTER — Other Ambulatory Visit: Payer: Self-pay

## 2017-11-25 LAB — BASIC METABOLIC PANEL
ANION GAP: 16 — AB (ref 5–15)
Anion gap: 14 (ref 5–15)
BUN: 85 mg/dL — ABNORMAL HIGH (ref 6–20)
BUN: 86 mg/dL — AB (ref 6–20)
CALCIUM: 8.9 mg/dL (ref 8.9–10.3)
CO2: 26 mmol/L (ref 22–32)
CO2: 27 mmol/L (ref 22–32)
CREATININE: 14.08 mg/dL — AB (ref 0.61–1.24)
Calcium: 9.2 mg/dL (ref 8.9–10.3)
Chloride: 97 mmol/L — ABNORMAL LOW (ref 101–111)
Chloride: 99 mmol/L — ABNORMAL LOW (ref 101–111)
Creatinine, Ser: 14.61 mg/dL — ABNORMAL HIGH (ref 0.61–1.24)
GFR calc Af Amer: 4 mL/min — ABNORMAL LOW (ref >60)
GFR, EST AFRICAN AMERICAN: 4 mL/min — AB (ref >60)
GFR, EST NON AFRICAN AMERICAN: 3 mL/min — AB (ref >60)
GFR, EST NON AFRICAN AMERICAN: 3 mL/min — AB (ref >60)
GLUCOSE: 73 mg/dL (ref 65–99)
Glucose, Bld: 176 mg/dL — ABNORMAL HIGH (ref 65–99)
POTASSIUM: 5 mmol/L (ref 3.5–5.1)
Potassium: 4.3 mmol/L (ref 3.5–5.1)
SODIUM: 139 mmol/L (ref 135–145)
Sodium: 140 mmol/L (ref 135–145)

## 2017-11-25 LAB — GRAM STAIN

## 2017-11-25 LAB — BODY FLUID CELL COUNT WITH DIFFERENTIAL: WBC FLUID: 2 uL (ref 0–1000)

## 2017-11-25 LAB — HEPATIC FUNCTION PANEL
ALT: 14 U/L — ABNORMAL LOW (ref 17–63)
AST: 27 U/L (ref 15–41)
Albumin: 2.4 g/dL — ABNORMAL LOW (ref 3.5–5.0)
Alkaline Phosphatase: 49 U/L (ref 38–126)
BILIRUBIN DIRECT: 0.1 mg/dL (ref 0.1–0.5)
BILIRUBIN INDIRECT: 0.6 mg/dL (ref 0.3–0.9)
BILIRUBIN TOTAL: 0.7 mg/dL (ref 0.3–1.2)
Total Protein: 4.9 g/dL — ABNORMAL LOW (ref 6.5–8.1)

## 2017-11-25 LAB — GLUCOSE, CAPILLARY
GLUCOSE-CAPILLARY: 216 mg/dL — AB (ref 65–99)
GLUCOSE-CAPILLARY: 93 mg/dL (ref 65–99)
GLUCOSE-CAPILLARY: 230 mg/dL — AB (ref 65–99)
Glucose-Capillary: 150 mg/dL — ABNORMAL HIGH (ref 65–99)
Glucose-Capillary: 145 mg/dL — ABNORMAL HIGH (ref 65–99)
Glucose-Capillary: 100 mg/dL — ABNORMAL HIGH (ref 65–99)

## 2017-11-25 MED ORDER — HYDRALAZINE HCL 20 MG/ML IJ SOLN
20.0000 mg | Freq: Four times a day (QID) | INTRAMUSCULAR | Status: DC
  Administered 2017-11-25 – 2017-11-28 (×13): 20 mg via INTRAVENOUS
  Filled 2017-11-25 (×13): qty 1

## 2017-11-25 MED ORDER — ORAL CARE MOUTH RINSE
15.0000 mL | Freq: Two times a day (BID) | OROMUCOSAL | Status: DC
  Administered 2017-11-28: 15 mL via OROMUCOSAL

## 2017-11-25 MED ORDER — CHLORHEXIDINE GLUCONATE 0.12 % MT SOLN
15.0000 mL | Freq: Two times a day (BID) | OROMUCOSAL | Status: DC
  Administered 2017-11-25 – 2017-12-01 (×12): 15 mL via OROMUCOSAL
  Filled 2017-11-25 (×11): qty 15

## 2017-11-25 MED ORDER — CLONIDINE HCL 0.2 MG/24HR TD PTWK: 0.2000 mg | MEDICATED_PATCH | TRANSDERMAL | Status: DC

## 2017-11-25 MED ORDER — SODIUM CHLORIDE 0.9 % IV SOLN
500.0000 mg | INTRAVENOUS | Status: DC
  Administered 2017-11-25 – 2017-11-27 (×3): 500 mg via INTRAVENOUS
  Filled 2017-11-25 (×3): qty 5

## 2017-11-25 NOTE — Progress Notes (Signed)
Pt. Was alert and talking with family. Was aware he was in the hospital, was not sure what year it was, answered simple questions bp was 174/118. BP now 196/70 not answering questions, alert only localizing to pain.

## 2017-11-25 NOTE — Progress Notes (Signed)
Pt. Requiring nitro gtt @ 145mcg/min.  Jeannette Corpus, NP notified.  No additional orders received.

## 2017-11-25 NOTE — Progress Notes (Signed)
[facility] Progress Note  Subjective: more responsive today.  Got PD overnight  Vitals:   11/25/17 0700 11/25/17 0746 11/25/17 0850 11/25/17 1030  BP: (!) 194/106 (!) 180/86 (!) 195/90 (!) 174/93  Pulse: 74 73 68   Resp: (!) 29 12 13    Temp:  98.3 F (36.8 C) 98.6 F (37 C)   TempSrc:  Axillary Oral   SpO2: 98% 92% 96%   Weight:   64.6 kg (142 lb 6.7 oz)   Height:        Inpatient medications: . [START ON 12/01/2017] cloNIDine  0.2 mg Transdermal Weekly  . gentamicin cream  1 application Topical Daily  . heparin  5,000 Units Subcutaneous Q8H  . hydrALAZINE  20 mg Intravenous QID  . insulin aspart  0-9 Units Subcutaneous Q4H  . insulin glargine  20 Units Subcutaneous Daily  . nitroGLYCERIN  0.2 mg Transdermal Daily  . sodium chloride flush  3 mL Intravenous Q12H   . sodium chloride    . dialysis solution 2.5% low-MG/low-CA    . levETIRAcetam 500 mg (11/25/17 1030)  . nitroGLYCERIN 165 mcg/min (11/25/17 0830)   sodium chloride, dianeal solution for CAPD/CCPD with heparin, labetalol, sodium chloride flush  Exam: Gen more responsive, opens eyes to voice No rash, cyanosis or gangrene Sclera anicteric, throat clear  No jvd or bruits Chest clear bilat RRR no MRG Abd soft ntnd no mass or ascites +bs, PD cath intact GU normal male MS no joint effusions or deformity Ext no LE edema / no wounds or ulcers Neuro opens eyes and makes some bit of eye contact, nonverbal   Home meds: -clonidine 0.1 bid/ hydralazine 100 tid/ metoprolol 100 bid/ procardia XL 90 qd -keppra 250 qd -PPI/ renvela ac/ MVI/ Imdur/ lipitor -NPH insulin 20 qam  Dialysis: CCPD 74kg  6 exchanges total, fill volume 2500, dwell time 1h 32min, last fill 2000, no pause  Na 139  K 5.9  BUN 86  Cr 14.27  Impression: 1. Altered mental status - better today but still has a way to go.  Wonder if he is doing PD properly at home, this is 3rd admission for AMS +/- seizures this year and uremia  could explain these findings, especially if he continues to get slowly better w/ proper nightly PD.   2. 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 Uncont HTN - inc'd catapres to #2 patch and ^'d IV hydral to 20 qid 4. 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 Low grade fever - per primary. Will send PD fluid for cell count/ culture 6. DM2 - on insulin   Plan - as above   Kelly Splinter MD [facility] pager [phone]   11/25/2017, 11:01 AM   Recent Labs  Lab 11/24/17 0700 11/24/17 1908 11/25/17 0836  NA 139 139 139  K 5.9* 5.0 4.3  CL 95* 94* 97*  CO2 22 19* 26  GLUCOSE 345* 365* 176*  BUN 86* 103* 85*  CREATININE 14.27* 15.97* 14.08*  CALCIUM 9.6 9.5 8.9   Recent Labs  Lab 11/24/17 0954 11/25/17 0836  AST 30 27  ALT 17 14*  ALKPHOS 59 49  BILITOT 1.5* 0.7  PROT 6.2* 4.9*  ALBUMIN 3.2* 2.4*   Recent Labs  Lab 11/24/17 0700  WBC 8.0  NEUTROABS 6.5  HGB 14.4  HCT 43.3  MCV 84.7  PLT 161   Iron/TIBC/Ferritin/ %Sat    Component Value Date/Time   IRON  45 03/26/2017 1255   TIBC 193 (L) 03/26/2017 1255   IRONPCTSAT 23 03/26/2017 1255

## 2017-11-25 NOTE — Progress Notes (Signed)
PROGRESS NOTE  Jared [name] ASN:[id number] DOB: November 23, 1956 DOA: 11/24/2017 PCP: Girtha Rm, NP-C   Brief summary:   EMS from Home, pt found in lethargic state by family this AM. Family suspects seizure activity but actual seizure was not witnessed. No oral trauma noted. Pt on home dialysis every night and was unable to do so yesterday.  vital on arrival  To [place] ED: BP 261/128 P 84 RR 16 GCS 13 CBG 397  Jared [name] is a 61 y.o. male with medical history of peritoneal dialysis, DM, HTN, seizures presents with confusion. His sister gives the history. She lives with him and has noted him complain of abdominal discomfort and with a poor appetite for about 4-5 days now. He has had some nausea. She states it is not uncommon for him to have GI issues like this. Yesterday, she noted at about 4 AM, he was quite confused. He did not know who she was. She states he skipped his dialysis yesterday evening which he does not usually do. He is generally quite independent and takes reasonably good care of himself.  In the ER, he has been confused and agitated and currently is calm after Ativan.   ED Course:  BP 240/125 K 5.9 Cr 14 Given Labetalol for HTN and Ativan for agitation - head CT negative    HPI/Recap of past 24 hours:  Last dose ativan at 4am, he is confused and lethargic currently, open eyes to voice, not following commands Sitting in room bp elevated on nitrodrip  PD at bedside  Assessment/Plan: Principal Problem:   Metabolic encephalopathy Active Problems:   CKD (chronic kidney disease) stage V requiring chronic dialysis (HCC)   Anemia of chronic disease   Accelerated hypertension   Type 2 diabetes mellitus with peripheral neuropathy (HCC)   Acute metabolic encephalopathy  Encephalopathy:  -Family denies seizure activity, family reports home meds keppra was recently discontinued.  -Likely metabolic, with hypertension urgency, elevated blood sugar  (>1000), bun, acidosis. - uds negative, ct head no acute findings, ammonia level unremarkable, ua unremarkable, no fever, no leukocytosis, does not appear to have infection ( nephrology plan to culture PD fluids) -will order mri once patient able to cooperate with exam  Hypertension urgency: BP 261/128 on arrival, he is started on nitrodrip, stepdown admission He is also on clonidine patch, scheduled iv hydralazine Nephrology following  ESRD On CPPD: nephrology consulted Case discussed with nephrology   Insulin dependent DM2, a1c pending, adjust insulin. He does has elevated anion gap but also has ESRD. Repeat bmp this am, anion gap has improved, down from 26 to 16, repeat bmp in pm,    H/o seizure:  -per family denies seizure activity, family reports home meds keppra was recently discontinued.  - no seizure activity observed in the hospital so far, will get eeg, seizure precaution, start on iv keppra   Code Status: full  Family Communication: patient   Disposition Plan: remain in stepdown   Consultants:  nephrology  Procedures:  none  Antibiotics:  none   Objective: BP (!) 194/106   Pulse 74   Temp 98.3 F (36.8 C) (Oral)   Resp (!) 29   Ht 5\' 6"  (1.676 m)   Wt 69.4 kg (152 lb 16 oz)   SpO2 98%   BMI 24.69 kg/m   Intake/Output Summary (Last 24 hours) at 11/25/2017 0752 Last data filed at 11/25/2017 0408 Gross per 24 hour  Intake 51.6 ml  Output -  Net 51.6 ml  Filed Weights   11/24/17 0816 11/24/17 1929  Weight: 70.3 kg (155 lb) 69.4 kg (152 lb 16 oz)    Exam: Patient is examined daily including today on 11/25/2017, exams remain the same as of yesterday except that has changed    General:  Open eyes to voice, not talking, not following commands  Cardiovascular: RRR  Respiratory: CTABL  Abdomen: Soft/ND/NT, positive BS  Musculoskeletal: No Edema  Neuro: Open eyes to voice, not talking, not following commands, moving all  extremities   Data Reviewed: Basic Metabolic Panel: Recent Labs  Lab 11/24/17 0700 11/24/17 1908  NA 139 139  K 5.9* 5.0  CL 95* 94*  CO2 22 19*  GLUCOSE 345* 365*  BUN 86* 103*  CREATININE 14.27* 15.97*  CALCIUM 9.6 9.5   Liver Function Tests: Recent Labs  Lab 11/24/17 0954  AST 30  ALT 17  ALKPHOS 59  BILITOT 1.5*  PROT 6.2*  ALBUMIN 3.2*   No results for input(s): LIPASE, AMYLASE in the last 168 hours. Recent Labs  Lab 11/24/17 0954  AMMONIA 23   CBC: Recent Labs  Lab 11/24/17 0700  WBC 8.0  NEUTROABS 6.5  HGB 14.4  HCT 43.3  MCV 84.7  PLT 161   Cardiac Enzymes:   Recent Labs  Lab 11/24/17 0954  TROPONINI 0.04*   BNP (last 3 results) No results for input(s): BNP in the last 8760 hours.  ProBNP (last 3 results) No results for input(s): PROBNP in the last 8760 hours.  CBG: Recent Labs  Lab 11/24/17 1518 11/24/17 1738 11/24/17 1916 11/24/17 2344 11/25/17 0313  GLUCAP 508* 404* 337* 276* 216*    Recent Results (from the past 240 hour(s))  MRSA PCR Screening     Status: None   Collection Time: 11/24/17  7:16 PM  Result Value Ref Range Status   MRSA by PCR NEGATIVE NEGATIVE Final    Comment:        The GeneXpert MRSA Assay (FDA approved for NASAL specimens only), is one component of a comprehensive MRSA colonization surveillance program. It is not intended to diagnose MRSA infection nor to guide or monitor treatment for MRSA infections.      Studies: Ct Head Wo Contrast  Result Date: 11/24/2017 CLINICAL DATA:  Altered mental status. EXAM: CT HEAD WITHOUT CONTRAST TECHNIQUE: Contiguous axial images were obtained from the base of the skull through the vertex without intravenous contrast. COMPARISON:  MRI brain dated March 17, 2017. CT head dated March 13, 2017. FINDINGS: Despite efforts by the technologist and patient, motion artifact is present on today's exam and could not be eliminated. This reduces exam sensitivity and  specificity. Brain: No evidence of acute infarction, hemorrhage, hydrocephalus, extra-axial collection or mass lesion/mass effect. Stable mild cerebral atrophy. Vascular: No hyperdense vessel or unexpected calcification. Skull: Normal. Negative for fracture. Stable ground-glass, slightly expansile lesion in the anteromedial wall left maxillary sinus. Sinuses/Orbits: No acute finding. Other: None. IMPRESSION: 1. Slightly limited study due to motion artifact. No acute intracranial abnormality. Electronically Signed   By: Titus Dubin M.D.   On: 11/24/2017 10:06   Dg Chest Port 1 View  Result Date: 11/24/2017 CLINICAL DATA:  Altered mental status. EXAM: PORTABLE CHEST 1 VIEW COMPARISON:  03/14/2017 FINDINGS: Technique limitation, with exclusion of a portion of the lower chest. Normal heart size. Overlying support apparatus projecting over the lower heart. No large pleural fluid or pneumothorax. Upper lungs clear. IMPRESSION: Moderate limitations, as detailed above. Given these limitations, no acute disease. Electronically  Signed   By: Abigail Miyamoto M.D.   On: 11/24/2017 10:03    Scheduled Meds: . cloNIDine  0.1 mg Transdermal Weekly  . gentamicin cream  1 application Topical Daily  . heparin  5,000 Units Subcutaneous Q8H  . hydrALAZINE  10 mg Intravenous QID  . insulin aspart  0-9 Units Subcutaneous Q4H  . insulin glargine  20 Units Subcutaneous Daily  . nitroGLYCERIN  0.2 mg Transdermal Daily  . sodium chloride flush  3 mL Intravenous Q12H    Continuous Infusions: . sodium chloride    . dialysis solution 2.5% low-MG/low-CA    . nitroGLYCERIN 160 mcg/min (11/25/17 0702)     Time spent: 35 mins I have personally reviewed and interpreted on  11/25/2017 daily labs, tele strips, imagings as discussed above under date review session and assessment and plans.  I reviewed all nursing notes, pharmacy notes, consultant notes,  vitals, pertinent old records  I have discussed plan of care as  described above with RN , patient on 11/25/2017   Florencia Reasons MD, PhD  [facility] Pager [phone]. If 7PM-7AM, please contact night-coverage at [email or web address], password [facility] 11/25/2017, 7:52 AM  LOS: 1 day

## 2017-11-25 NOTE — Progress Notes (Signed)
Pt BP remains elevated with ntg drip.  Jeannette Corpus notified.  To continue ntg titration as ordered and notify when patient is at 127mcg/min.

## 2017-11-26 ENCOUNTER — Inpatient Hospital Stay (HOSPITAL_COMMUNITY): Payer: Medicare Other

## 2017-11-26 DIAGNOSIS — I129 Hypertensive chronic kidney disease with stage 1 through stage 4 chronic kidney disease, or unspecified chronic kidney disease: Secondary | ICD-10-CM | POA: Diagnosis not present

## 2017-11-26 DIAGNOSIS — N186 End stage renal disease: Secondary | ICD-10-CM | POA: Diagnosis not present

## 2017-11-26 DIAGNOSIS — Z992 Dependence on renal dialysis: Secondary | ICD-10-CM | POA: Diagnosis not present

## 2017-11-26 LAB — GLUCOSE, CAPILLARY
GLUCOSE-CAPILLARY: 264 mg/dL — AB (ref 65–99)
GLUCOSE-CAPILLARY: 264 mg/dL — AB (ref 65–99)
GLUCOSE-CAPILLARY: 170 mg/dL — AB (ref 65–99)
GLUCOSE-CAPILLARY: 112 mg/dL — AB (ref 65–99)
GLUCOSE-CAPILLARY: 158 mg/dL — AB (ref 65–99)
Glucose-Capillary: 47 mg/dL — ABNORMAL LOW (ref 65–99)
Glucose-Capillary: 141 mg/dL — ABNORMAL HIGH (ref 65–99)

## 2017-11-26 LAB — BASIC METABOLIC PANEL
ANION GAP: 12 (ref 5–15)
BUN: 67 mg/dL — ABNORMAL HIGH (ref 6–20)
CALCIUM: 8.9 mg/dL (ref 8.9–10.3)
CO2: 27 mmol/L (ref 22–32)
Chloride: 96 mmol/L — ABNORMAL LOW (ref 101–111)
Creatinine, Ser: 13.27 mg/dL — ABNORMAL HIGH (ref 0.61–1.24)
GFR, EST AFRICAN AMERICAN: 4 mL/min — AB (ref >60)
GFR, EST NON AFRICAN AMERICAN: 3 mL/min — AB (ref >60)
GLUCOSE: 177 mg/dL — AB (ref 65–99)
POTASSIUM: 3.8 mmol/L (ref 3.5–5.1)
Sodium: 135 mmol/L (ref 135–145)

## 2017-11-26 LAB — HEMOGLOBIN A1C
Hgb A1c MFr Bld: 8.9 % — ABNORMAL HIGH (ref 4.8–5.6)
Mean Plasma Glucose: 208.73 mg/dL

## 2017-11-26 MED ORDER — DEXTROSE 50 % IV SOLN
INTRAVENOUS | Status: AC
  Administered 2017-11-26: 50 mL via DENTAL
  Filled 2017-11-26: qty 50

## 2017-11-26 MED ORDER — INSULIN GLARGINE 100 UNIT/ML ~~LOC~~ SOLN
10.0000 [IU] | Freq: Every day | SUBCUTANEOUS | Status: DC
  Administered 2017-11-27 – 2017-11-30 (×4): 10 [IU] via SUBCUTANEOUS
  Filled 2017-11-26 (×5): qty 0.1

## 2017-11-26 NOTE — Procedures (Addendum)
  Azure A. Merlene Laughter, MD     [email or web address]           HISTORY: This is a 61 year old who presents with confusion and seizures.  MEDICATIONS: Scheduled Meds: . chlorhexidine  15 mL Mouth Rinse BID  . [START ON 12/01/2017] cloNIDine  0.2 mg Transdermal Weekly  . gentamicin cream  1 application Topical Daily  . heparin  5,000 Units Subcutaneous Q8H  . hydrALAZINE  20 mg Intravenous QID  . insulin aspart  0-9 Units Subcutaneous Q4H  . [START ON 11/27/2017] insulin glargine  10 Units Subcutaneous Daily  . mouth rinse  15 mL Mouth Rinse q12n4p  . sodium chloride flush  3 mL Intravenous Q12H   Continuous Infusions: . sodium chloride    . dialysis solution 2.5% low-MG/low-CA    . levETIRAcetam Stopped (11/26/17 1030)  . nitroGLYCERIN 120 mcg/min (11/26/17 1900)   PRN Meds:.sodium chloride, dianeal solution for CAPD/CCPD with heparin, labetalol, sodium chloride flush  Prior to Admission medications   Medication Sig Start Date End Date Taking? Authorizing Provider  atorvastatin (LIPITOR) 20 MG tablet Take 1 tablet (20 mg total) by mouth daily. 03/27/17  Yes Angiulli, Lavon Paganini, PA-C  cloNIDine (CATAPRES) 0.1 MG tablet Take 0.2 mg by mouth 2 (two) times daily.    Yes [provider]  hydrALAZINE (APRESOLINE) 100 MG tablet Take 1 tablet (100 mg total) by mouth 3 (three) times daily. 03/27/17  Yes Angiulli, Lavon Paganini, PA-C  insulin NPH Human (NOVOLIN N) 100 UNIT/ML injection Inject 20 units every morning Patient taking differently: Inject 30 Units into the skin daily before breakfast.  04/03/17  Yes Renato Shin, MD  isosorbide mononitrate (IMDUR) 30 MG 24 hr tablet TAKE 1 TABLET BY MOUTH ONCE DAILY 07/26/17  Yes Henson, Vickie L, NP-C  metoprolol tartrate (LOPRESSOR) 100 MG tablet Take 1 tablet (100 mg total) by mouth 2 (two) times daily. 05/21/17  Yes Henson, Vickie L, NP-C  multivitamin (RENA-VIT) TABS tablet Take 1 tablet by mouth daily.   Yes [provider]  NIFEdipine (PROCARDIA XL/ADALAT-CC) 90 MG 24 hr tablet Take 1 tablet (90 mg total) by mouth daily. 03/27/17  Yes Angiulli, Lavon Paganini, PA-C  pantoprazole (PROTONIX) 40 MG tablet Take 1 tablet (40 mg total) by mouth daily at 6 (six) AM. 05/21/17  Yes Henson, Vickie L, NP-C  sevelamer carbonate (RENVELA) 800 MG tablet Take 3 tablets (2,400 mg total) by mouth 3 (three) times daily with meals. 03/27/17  Yes Angiulli, Lavon Paganini, PA-C  levETIRAcetam (KEPPRA) 250 MG tablet Take 1 tablet (250 mg total) by mouth daily. 08/24/17   [name], MD      ANALYSIS: A 16 channel recording using standard 10 20 measurements is conducted for 43 minutes.   The background activity gets as high as six hertz.  Awake and drowsy activities are observed.  Photic stimulation and hyperventilation are not conducted.  There is no focal or lateralized slowing.  There is no epileptiform activity is observed. Occasional frontal intermittent rhythmic delta activity is observed.   IMPRESSION: 1.   This recording of the awake and drowsy states shows moderate global slowing indicating a moderate global encephalopathy.  However, there is no epileptiform activity is observed.  2.   Occasional frontal intermittent rhythmic delta activity is observed which is typically seen in toxic metabolic processes.    Timmy Bubeck A. Merlene Laughter, M.D.  Diplomate, Tax adviser of Psychiatry and Neurology ( Neurology).

## 2017-11-26 NOTE — Progress Notes (Signed)
Patient ID: Jared [name], male   DOB: [birth year], 61 y.o.   MRN: [id number] S: No events overnight.  Will open eyes but only speaks in one word responses and not consistently O:BP (!) 135/91   Pulse 96   Temp 98.5 F (36.9 C) (Axillary)   Resp 15   Ht 5\' 6"  (1.676 m)   Wt 68.3 kg (150 lb 9.2 oz)   SpO2 99%   BMI 24.30 kg/m   Intake/Output Summary (Last 24 hours) at 11/26/2017 0951 Last data filed at 11/25/2017 1600 Gross per 24 hour  Intake 657.85 ml  Output -  Net 657.85 ml   Intake/Output: I/O last 3 completed shifts: In: 10710.5 [I.V.:604.5; Other:10001; IV Piggyback:105] Out: 07622 [Other:12787]  Intake/Output this shift:  No intake/output data recorded. Weight change: -5.708 kg (-9.3 oz) Gen: NAD CVS: no rub Resp: CTA Abd: PD cath in place, +BS, soft, NT/ND Ext: no edema. LUE AVF +T/B  Recent Labs  Lab 11/24/17 0700 11/24/17 0954 11/24/17 1908 11/25/17 0836 11/25/17 1555  NA 139  --  139 139 140  K 5.9*  --  5.0 4.3 5.0  CL 95*  --  94* 97* 99*  CO2 22  --  19* 26 27  GLUCOSE 345*  --  365* 176* 73  BUN 86*  --  103* 85* 86*  CREATININE 14.27*  --  15.97* 14.08* 14.61*  ALBUMIN  --  3.2*  --  2.4*  --   CALCIUM 9.6  --  9.5 8.9 9.2  AST  --  30  --  27  --   ALT  --  17  --  14*  --    Liver Function Tests: Recent Labs  Lab 11/24/17 0954 11/25/17 0836  AST 30 27  ALT 17 14*  ALKPHOS 59 49  BILITOT 1.5* 0.7  PROT 6.2* 4.9*  ALBUMIN 3.2* 2.4*   No results for input(s): LIPASE, AMYLASE in the last 168 hours. Recent Labs  Lab 11/24/17 0954  AMMONIA 23   CBC: Recent Labs  Lab 11/24/17 0700  WBC 8.0  NEUTROABS 6.5  HGB 14.4  HCT 43.3  MCV 84.7  PLT 161   Cardiac Enzymes: Recent Labs  Lab 11/24/17 0954  TROPONINI 0.04*   CBG: Recent Labs  Lab 11/25/17 1319 11/25/17 1641 11/25/17 1913 11/25/17 2307 11/26/17 0307  GLUCAP 145* 100* 93 230* 264*    Iron Studies: No results for input(s): IRON, TIBC, TRANSFERRIN, FERRITIN in  the last 72 hours. Studies/Results: No results found. . chlorhexidine  15 mL Mouth Rinse BID  . [START ON 12/01/2017] cloNIDine  0.2 mg Transdermal Weekly  . gentamicin cream  1 application Topical Daily  . heparin  5,000 Units Subcutaneous Q8H  . hydrALAZINE  20 mg Intravenous QID  . insulin aspart  0-9 Units Subcutaneous Q4H  . insulin glargine  20 Units Subcutaneous Daily  . mouth rinse  15 mL Mouth Rinse q12n4p  . sodium chloride flush  3 mL Intravenous Q12H    BMET    Component Value Date/Time   NA 140 11/25/2017 1555   K 5.0 11/25/2017 1555   CL 99 (L) 11/25/2017 1555   CO2 27 11/25/2017 1555   GLUCOSE 73 11/25/2017 1555   BUN 86 (H) 11/25/2017 1555   CREATININE 14.61 (H) 11/25/2017 1555   CREATININE 10.58 (H) 01/04/2017 1638   CALCIUM 9.2 11/25/2017 1555   GFRNONAA 3 (L) 11/25/2017 1555   GFRAA 4 (L) 11/25/2017 1555   CBC  Component Value Date/Time   WBC 8.0 11/24/2017 0700   RBC 5.11 11/24/2017 0700   HGB 14.4 11/24/2017 0700   HCT 43.3 11/24/2017 0700   PLT 161 11/24/2017 0700   MCV 84.7 11/24/2017 0700   MCH 28.2 11/24/2017 0700   MCHC 33.3 11/24/2017 0700   RDW 14.5 11/24/2017 0700   LYMPHSABS 1.1 11/24/2017 0700   MONOABS 0.4 11/24/2017 0700   EOSABS 0.0 11/24/2017 0700   BASOSABS 0.0 11/24/2017 0700   Dialysis:CCPD 74kg 6 exchanges total, fill volume 2500, dwell time 1h 23min, last fill 2000, no pause   Assessment/Plan:  1. AMS- likely related to hypertensive urgency as well as metabolic encephalopathy, although he also has a seizure history and may need Neuro input.  Minimally verbal today.   2. ESRD- continue with CCPD as ordered.  Will need to speak with his primary nephrologist to see if he is appropriate for ongoing PD.  Not currently able to perform PD on his own and will follow his mental status.  Awaiting labs from today. 3. Hypertensive urgency- markedly improved with meds 4. Vascular access- has LUE AVF +T/B, unfortunately found BP cuff  over AVF this morning.  Nurse placed alert bracelet on left arm. 5. Seizure disorder- on Keppra 6. DM- per primary 7. Disposition- unclear at this time.  He will need to recover his cognitive function to be able to continue with PD at home.    Donetta Potts, MD [employer] [phone]

## 2017-11-26 NOTE — Progress Notes (Addendum)
PROGRESS NOTE  Jared [name] JAS:[id number] DOB: 14-Apr-1956 DOA: 11/24/2017 PCP: Girtha Rm, NP-C   Brief summary:   EMS from Home, pt found in lethargic state by family this AM. Family suspects seizure activity but actual seizure was not witnessed. No oral trauma noted. Pt on home dialysis every night and was unable to do so yesterday.  vital on arrival  To [place] ED: BP 261/128 P 84 RR 16 GCS 13 CBG 397  Jared [name] is a 61 y.o. male with medical history of peritoneal dialysis, DM, HTN, seizures presents with confusion. His sister gives the history. She lives with him and has noted him complain of abdominal discomfort and with a poor appetite for about 4-5 days now. He has had some nausea. She states it is not uncommon for him to have GI issues like this. Yesterday, she noted at about 4 AM, he was quite confused. He did not know who she was. She states he skipped his dialysis yesterday evening which he does not usually do. He is generally quite independent and takes reasonably good care of himself.  In the ER, he has been confused and agitated and currently is calm after Ativan.   ED Course:  BP 240/125 K 5.9 Cr 14 Given Labetalol for HTN and Ativan for agitation - head CT negative    HPI/Recap of past 24 hours:  No more agitation, RN reports intermittent lucid period since yesterday afternoon He is drowsy this am when I see him.  Sitting in room bp has improved, remain on nitrodrip  PD at bedside  Assessment/Plan: Principal Problem:   Metabolic encephalopathy Active Problems:   CKD (chronic kidney disease) stage V requiring chronic dialysis (HCC)   Anemia of chronic disease   Accelerated hypertension   Type 2 diabetes mellitus with peripheral neuropathy (HCC)   Acute metabolic encephalopathy  Encephalopathy:  -Family denies seizure activity, family reports home meds keppra was recently discontinued.  -Likely metabolic, with hypertension urgency,  elevated blood sugar (>1000), bun, acidosis. - uds negative, ct head no acute findings, ammonia level unremarkable, ua unremarkable, no fever, no leukocytosis, does not appear to have infection , pd fluids cell counts wbc 2, culture in process. -no more agitation, remain drowsy, will order mri brain, eeg scheduled to be done today.  Hypertension urgency: -BP 261/128 on arrival, he is started on nitrodrip, stepdown admission He is also on clonidine patch, scheduled iv hydralazine bp better, Nephrology following  ESRD On CPPD: nephrology consulted Case discussed with nephrology   Insulin dependent DM2, a1c 8.9, adjust insulin.  He presented with hyperglycemia, elevated anion gap of 26 on admission, but also has ESRD.  anion gap has improved, down from 26 to 14,  Continue monitor  bmp  Has hypoglycemia episode, decrease lantus, continue ssi, on hypoglycemia protocol.   H/o seizure:  -per family denies seizure activity, family reports home meds keppra was recently discontinued.  - no seizure activity observed in the hospital so far, will get eeg, seizure precaution, start on iv keppra Remain drowsy, remain npo   Code Status: full  Family Communication: patient   Disposition Plan: remain in stepdown   Consultants:  nephrology  Procedures:  PD  Antibiotics:  none   Objective: BP (!) 135/91   Pulse 96   Temp 98.5 F (36.9 C) (Axillary)   Resp 15   Ht 5\' 6"  (1.676 m)   Wt 68.3 kg (150 lb 9.2 oz)   SpO2 99%   BMI 24.30  kg/m   Intake/Output Summary (Last 24 hours) at 11/26/2017 0902 Last data filed at 11/25/2017 1600 Gross per 24 hour  Intake 657.85 ml  Output -  Net 657.85 ml   Filed Weights   11/24/17 1929 11/25/17 0850 11/26/17 0500  Weight: 69.4 kg (152 lb 16 oz) 64.6 kg (142 lb 6.7 oz) 68.3 kg (150 lb 9.2 oz)    Exam: Patient is examined daily including today on 11/26/2017, exams remain the same as of yesterday except that has changed    General:   Open eyes to voice, not talking, not following commands  Cardiovascular: RRR  Respiratory: CTABL  Abdomen: Soft/ND/NT, positive BS  Musculoskeletal: No Edema  Neuro: Open eyes to voice, not talking, not following commands, moving all extremities   Data Reviewed: Basic Metabolic Panel: Recent Labs  Lab 11/24/17 0700 11/24/17 1908 11/25/17 0836 11/25/17 1555  NA 139 139 139 140  K 5.9* 5.0 4.3 5.0  CL 95* 94* 97* 99*  CO2 22 19* 26 27  GLUCOSE 345* 365* 176* 73  BUN 86* 103* 85* 86*  CREATININE 14.27* 15.97* 14.08* 14.61*  CALCIUM 9.6 9.5 8.9 9.2   Liver Function Tests: Recent Labs  Lab 11/24/17 0954 11/25/17 0836  AST 30 27  ALT 17 14*  ALKPHOS 59 49  BILITOT 1.5* 0.7  PROT 6.2* 4.9*  ALBUMIN 3.2* 2.4*   No results for input(s): LIPASE, AMYLASE in the last 168 hours. Recent Labs  Lab 11/24/17 0954  AMMONIA 23   CBC: Recent Labs  Lab 11/24/17 0700  WBC 8.0  NEUTROABS 6.5  HGB 14.4  HCT 43.3  MCV 84.7  PLT 161   Cardiac Enzymes:   Recent Labs  Lab 11/24/17 0954  TROPONINI 0.04*   BNP (last 3 results) No results for input(s): BNP in the last 8760 hours.  ProBNP (last 3 results) No results for input(s): PROBNP in the last 8760 hours.  CBG: Recent Labs  Lab 11/25/17 1319 11/25/17 1641 11/25/17 1913 11/25/17 2307 11/26/17 0307  GLUCAP 145* 100* 93 230* 264*    Recent Results (from the past 240 hour(s))  MRSA PCR Screening     Status: None   Collection Time: 11/24/17  7:16 PM  Result Value Ref Range Status   MRSA by PCR NEGATIVE NEGATIVE Final    Comment:        The GeneXpert MRSA Assay (FDA approved for NASAL specimens only), is one component of a comprehensive MRSA colonization surveillance program. It is not intended to diagnose MRSA infection nor to guide or monitor treatment for MRSA infections.   Gram stain     Status: None   Collection Time: 11/25/17  8:30 AM  Result Value Ref Range Status   Specimen Description  PERITONEAL  Final   Special Requests NONE  Final   Gram Stain   Final    WBC PRESENT, PREDOMINANTLY MONONUCLEAR NO ORGANISMS SEEN CYTOSPIN SMEAR    Report Status 11/25/2017 FINAL  Final     Studies: No results found.  Scheduled Meds: . chlorhexidine  15 mL Mouth Rinse BID  . [START ON 12/01/2017] cloNIDine  0.2 mg Transdermal Weekly  . gentamicin cream  1 application Topical Daily  . heparin  5,000 Units Subcutaneous Q8H  . hydrALAZINE  20 mg Intravenous QID  . insulin aspart  0-9 Units Subcutaneous Q4H  . insulin glargine  20 Units Subcutaneous Daily  . mouth rinse  15 mL Mouth Rinse q12n4p  . nitroGLYCERIN  0.2 mg Transdermal  Daily  . sodium chloride flush  3 mL Intravenous Q12H    Continuous Infusions: . sodium chloride    . dialysis solution 2.5% low-MG/low-CA    . levETIRAcetam Stopped (11/25/17 1322)  . nitroGLYCERIN 180 mcg/min (11/26/17 0557)     Time spent: 35 mins I have personally reviewed and interpreted on  11/26/2017 daily labs, tele strips, imagings as discussed above under date review session and assessment and plans.  I reviewed all nursing notes, pharmacy notes, consultant notes,  vitals, pertinent old records  I have discussed plan of care as described above with RN , patient on 11/26/2017   Florencia Reasons MD, PhD  [facility] Pager [phone]. If 7PM-7AM, please contact night-coverage at [email or web address], password [facility] 11/26/2017, 9:02 AM  LOS: 2 days

## 2017-11-26 NOTE — Progress Notes (Signed)
Unable to follow instructions, MD aware who claimed to hold on MRI> nuclear med. Made aware.

## 2017-11-26 NOTE — Progress Notes (Addendum)
EEG Completed; Results Pending. EEG study stopped early due to technical difficulties. Study was 1 min short.

## 2017-11-26 NOTE — Progress Notes (Signed)
Seizure bed pads not available, awaiting availability.

## 2017-11-26 NOTE — Progress Notes (Signed)
cbg-47 mg/ dl, lethargic, d50%25 ml given. Repeat cbg-112mg /dl. MD aware.

## 2017-11-27 ENCOUNTER — Inpatient Hospital Stay (HOSPITAL_COMMUNITY): Payer: Medicare Other

## 2017-11-27 DIAGNOSIS — D509 Iron deficiency anemia, unspecified: Secondary | ICD-10-CM | POA: Diagnosis not present

## 2017-11-27 DIAGNOSIS — N2581 Secondary hyperparathyroidism of renal origin: Secondary | ICD-10-CM | POA: Diagnosis not present

## 2017-11-27 DIAGNOSIS — K769 Liver disease, unspecified: Secondary | ICD-10-CM | POA: Diagnosis not present

## 2017-11-27 DIAGNOSIS — N186 End stage renal disease: Secondary | ICD-10-CM | POA: Diagnosis not present

## 2017-11-27 DIAGNOSIS — N2589 Other disorders resulting from impaired renal tubular function: Secondary | ICD-10-CM | POA: Diagnosis not present

## 2017-11-27 DIAGNOSIS — G9341 Metabolic encephalopathy: Secondary | ICD-10-CM

## 2017-11-27 DIAGNOSIS — R82998 Other abnormal findings in urine: Secondary | ICD-10-CM | POA: Diagnosis not present

## 2017-11-27 LAB — BASIC METABOLIC PANEL WITH GFR
Anion gap: 15 (ref 5–15)
BUN: 61 mg/dL — ABNORMAL HIGH (ref 6–20)
CO2: 26 mmol/L (ref 22–32)
Calcium: 8.4 mg/dL — ABNORMAL LOW (ref 8.9–10.3)
Chloride: 94 mmol/L — ABNORMAL LOW (ref 101–111)
Creatinine, Ser: 12.65 mg/dL — ABNORMAL HIGH (ref 0.61–1.24)
GFR calc Af Amer: 4 mL/min — ABNORMAL LOW
GFR calc non Af Amer: 4 mL/min — ABNORMAL LOW
Glucose, Bld: 200 mg/dL — ABNORMAL HIGH (ref 65–99)
Potassium: 3.9 mmol/L (ref 3.5–5.1)
Sodium: 135 mmol/L (ref 135–145)

## 2017-11-27 LAB — GLUCOSE, CAPILLARY
GLUCOSE-CAPILLARY: 182 mg/dL — AB (ref 65–99)
GLUCOSE-CAPILLARY: 162 mg/dL — AB (ref 65–99)
GLUCOSE-CAPILLARY: 133 mg/dL — AB (ref 65–99)
GLUCOSE-CAPILLARY: 66 mg/dL (ref 65–99)
GLUCOSE-CAPILLARY: 294 mg/dL — AB (ref 65–99)
Glucose-Capillary: 78 mg/dL (ref 65–99)
Glucose-Capillary: 189 mg/dL — ABNORMAL HIGH (ref 65–99)

## 2017-11-27 LAB — TSH: TSH: 1.403 u[IU]/mL (ref 0.350–4.500)

## 2017-11-27 LAB — VITAMIN B12: VITAMIN B 12: 1412 pg/mL — AB (ref 180–914)

## 2017-11-27 LAB — FOLATE: FOLATE: 8.3 ng/mL (ref >5.9)

## 2017-11-27 MED ORDER — DELFLEX-LC/2.5% DEXTROSE 394 MOSM/L IP SOLN
INTRAPERITONEAL | Status: DC
  Administered 2017-11-27: 19:00:00 via INTRAPERITONEAL

## 2017-11-27 MED ORDER — LEVETIRACETAM 250 MG PO TABS
250.0000 mg | ORAL_TABLET | Freq: Every day | ORAL | Status: DC
  Administered 2017-11-28 – 2017-12-01 (×4): 250 mg via ORAL
  Filled 2017-11-27 (×4): qty 1

## 2017-11-27 MED ORDER — ONDANSETRON HCL 4 MG/2ML IJ SOLN
4.0000 mg | Freq: Four times a day (QID) | INTRAMUSCULAR | Status: DC | PRN
  Administered 2017-11-27 – 2017-11-30 (×2): 4 mg via INTRAVENOUS
  Filled 2017-11-27 (×3): qty 2

## 2017-11-27 NOTE — Progress Notes (Addendum)
PROGRESS NOTE  Jared [name] BJS:[id number] DOB: 11-13-1956 DOA: 11/24/2017 PCP: Girtha Rm, NP-C   Brief summary:   EMS from Home, pt found in lethargic state by family this AM. Family suspects seizure activity but actual seizure was not witnessed. No oral trauma noted. Pt on home dialysis every night and was unable to do so yesterday.  vital on arrival  To [place] ED: BP 261/128 P 84 RR 16 GCS 13 CBG 397  Jared [name] is a 62 y.o. male with medical history of peritoneal dialysis, DM, HTN, seizures presents with confusion. His sister gives the history. She lives with him and has noted him complain of abdominal discomfort and with a poor appetite for about 4-5 days now. He has had some nausea. She states it is not uncommon for him to have GI issues like this. Yesterday, she noted at about 4 AM, he was quite confused. He did not know who she was. She states he skipped his dialysis yesterday evening which he does not usually do. He is generally quite independent and takes reasonably good care of himself.  In the ER, he has been confused and agitated and currently is calm after Ativan.   ED Course:  BP 240/125 K 5.9 Cr 14 Given Labetalol for HTN and Ativan for agitation - head CT negative    HPI/Recap of past 24 hours:  He is much better, aaox3, denies pain, no fever, family at bedside  PD at bedside  Assessment/Plan: Principal Problem:   Metabolic encephalopathy Active Problems:   CKD (chronic kidney disease) stage V requiring chronic dialysis (HCC)   Anemia of chronic disease   Accelerated hypertension   Type 2 diabetes mellitus with peripheral neuropathy (HCC)   Acute metabolic encephalopathy  Encephalopathy:  -per chart review, he has similar presentation 3 times in the past with encephalopathy, very elevated blood pressure and very elevated blood sugar around 1000, he reports compliance with his meds and PD -Family denies seizure activity, family  reports home meds keppra was recently discontinued.  -encephalopathy Likely metabolic, with hypertension urgency, elevated blood sugar (>1000), bun, acidosis. - uds negative, alcohol level less than 10, ct head no acute findings, ammonia level unremarkable, ua unremarkable, no fever, no leukocytosis, does not appear to have infection , pd fluids cell counts wbc 2, culture in process. -EEG consistent with metabolic encephalopathy, no seizure spikes -mri brain pending, was not able to get done yesterday due not not able to follow commands, will try to get it done today.  -continue keppra, neurology consult  Hypertension urgency: -BP 261/128 on arrival, he is started on nitrodrip, stepdown admission He is also on clonidine patch, scheduled iv hydralazine bp better, Nephrology following  ESRD On CPPD: nephrology consulted, will follow recommendations.   Insulin dependent DM2, a1c 8.9, adjust insulin.  He presented with hyperglycemia, elevated anion gap of 26 on admission, but also has ESRD.  anion gap has improved, down from 26 to 14,  Continue monitor  bmp  Has hypoglycemia episode, decrease lantus, continue ssi, on hypoglycemia protocol.   H/o seizure:  -per family denies seizure activity. family reports home meds keppra was recently discontinued.  - no seizure activity observed in the hospital so far,  -eeg no seizure spikes, seizure precaution,  -he is started on iv keppra, today is awake, mri pending, neurology consulted   H/o prostate cancer, he reports getting shots q79months.  N/V: family reports patient has intermittent n/v, poor appetite, wonder component of gastroparesis, will  start wit kub first, if symptom persist will get gastric empty study. Patient is not a good candidate for reglan due to seizure history.  Code Status: full  Family Communication: patient and family at bedside  Disposition Plan: remain in  stepdown   Consultants:  Nephrology  neurology  Procedures:  PD  Antibiotics:  none   Objective: BP (!) 147/86 (BP Location: Left Leg)   Pulse (!) 108   Temp 98.6 F (37 C) (Oral)   Resp 12   Ht 5\' 6"  (1.676 m)   Wt 68.4 kg (150 lb 12.7 oz)   SpO2 97%   BMI 24.34 kg/m   Intake/Output Summary (Last 24 hours) at 11/27/2017 0739 Last data filed at 11/27/2017 0349 Gross per 24 hour  Intake -  Output 100 ml  Net -100 ml   Filed Weights   11/26/17 0952 11/26/17 1812 11/27/17 0344  Weight: 67.2 kg (148 lb 2.4 oz) 65.6 kg (144 lb 10 oz) 68.4 kg (150 lb 12.7 oz)    Exam: Patient is examined daily including today on 11/27/2017, exams remain the same as of yesterday except that has changed    General:  aaox3  Cardiovascular: RRR  Respiratory: CTABL  Abdomen: Soft/ND/NT, positive BS  Musculoskeletal: No Edema  Neuro: aaox3   Data Reviewed: Basic Metabolic Panel: Recent Labs  Lab 11/24/17 1908 11/25/17 0836 11/25/17 1555 11/26/17 1135 11/27/17 0237  NA 139 139 140 135 135  K 5.0 4.3 5.0 3.8 3.9  CL 94* 97* 99* 96* 94*  CO2 19* 26 27 27 26   GLUCOSE 365* 176* 73 177* 200*  BUN 103* 85* 86* 67* 61*  CREATININE 15.97* 14.08* 14.61* 13.27* 12.65*  CALCIUM 9.5 8.9 9.2 8.9 8.4*   Liver Function Tests: Recent Labs  Lab 11/24/17 0954 11/25/17 0836  AST 30 27  ALT 17 14*  ALKPHOS 59 49  BILITOT 1.5* 0.7  PROT 6.2* 4.9*  ALBUMIN 3.2* 2.4*   No results for input(s): LIPASE, AMYLASE in the last 168 hours. Recent Labs  Lab 11/24/17 0954  AMMONIA 23   CBC: Recent Labs  Lab 11/24/17 0700  WBC 8.0  NEUTROABS 6.5  HGB 14.4  HCT 43.3  MCV 84.7  PLT 161   Cardiac Enzymes:   Recent Labs  Lab 11/24/17 0954  TROPONINI 0.04*   BNP (last 3 results) No results for input(s): BNP in the last 8760 hours.  ProBNP (last 3 results) No results for input(s): PROBNP in the last 8760 hours.  CBG: Recent Labs  Lab 11/26/17 1757 11/26/17 1955  11/26/17 2304 11/27/17 0347 11/27/17 0733  GLUCAP 112* 141* 158* 182* 162*    Recent Results (from the past 240 hour(s))  MRSA PCR Screening     Status: None   Collection Time: 11/24/17  7:16 PM  Result Value Ref Range Status   MRSA by PCR NEGATIVE NEGATIVE Final    Comment:        The GeneXpert MRSA Assay (FDA approved for NASAL specimens only), is one component of a comprehensive MRSA colonization surveillance program. It is not intended to diagnose MRSA infection nor to guide or monitor treatment for MRSA infections.   Culture, body fluid-bottle     Status: None (Preliminary result)   Collection Time: 11/25/17  8:30 AM  Result Value Ref Range Status   Specimen Description PERITONEAL  Final   Special Requests NONE  Final   Culture NO GROWTH 1 DAY  Final   Report Status PENDING  Incomplete  Gram stain     Status: None   Collection Time: 11/25/17  8:30 AM  Result Value Ref Range Status   Specimen Description PERITONEAL  Final   Special Requests NONE  Final   Gram Stain   Final    WBC PRESENT, PREDOMINANTLY MONONUCLEAR NO ORGANISMS SEEN CYTOSPIN SMEAR    Report Status 11/25/2017 FINAL  Final     Studies: No results found.  Scheduled Meds: . chlorhexidine  15 mL Mouth Rinse BID  . [START ON 12/01/2017] cloNIDine  0.2 mg Transdermal Weekly  . gentamicin cream  1 application Topical Daily  . heparin  5,000 Units Subcutaneous Q8H  . hydrALAZINE  20 mg Intravenous QID  . insulin aspart  0-9 Units Subcutaneous Q4H  . insulin glargine  10 Units Subcutaneous Daily  . mouth rinse  15 mL Mouth Rinse q12n4p  . sodium chloride flush  3 mL Intravenous Q12H    Continuous Infusions: . sodium chloride    . dialysis solution 2.5% low-MG/low-CA    . levETIRAcetam Stopped (11/26/17 1030)  . nitroGLYCERIN 120 mcg/min (11/27/17 0401)     Time spent: 35 mins I have personally reviewed and interpreted on  11/27/2017 daily labs, tele strips, imagings as discussed above under  date review session and assessment and plans.  I reviewed all nursing notes, pharmacy notes, consultant notes,  vitals, pertinent old records  I have discussed plan of care as described above with RN , patient and family on 11/27/2017   Florencia Reasons MD, PhD  [facility] Pager [phone]. If 7PM-7AM, please contact night-coverage at [email or web address], password [facility] 11/27/2017, 7:39 AM  LOS: 3 days

## 2017-11-27 NOTE — Progress Notes (Signed)
Patient ID: Jared [name], male   DOB: 08-19-1956, 62 y.o.   MRN: [id number] S: Much more awake and alert today.  Does not remember what happened prior to his hospitalization and states that he was doing his CCPD as prescribed. O:BP (!) 155/116 (BP Location: Right Arm)   Pulse (!) 109   Temp 98.7 F (37.1 C) (Oral)   Resp 18   Ht 5\' 6"  (1.676 m)   Wt 68.4 kg (150 lb 12.7 oz)   SpO2 97%   BMI 24.34 kg/m   Intake/Output Summary (Last 24 hours) at 11/27/2017 1255 Last data filed at 11/27/2017 0349 Gross per 24 hour  Intake -  Output 100 ml  Net -100 ml   Intake/Output: I/O last 3 completed shifts: In: -  Out: 100 [Urine:100]  Intake/Output this shift:  No intake/output data recorded. Weight change: 2.6 kg (5 lb 11.7 oz) Gen: NAD CVS: tachy, no rub Resp: cta Abd: distended, +BS, PD catheter in place Ext: no edema, LUE AVF +T/B  Recent Labs  Lab 11/24/17 0700 11/24/17 0954 11/24/17 1908 11/25/17 0836 11/25/17 1555 11/26/17 1135 11/27/17 0237  NA 139  --  139 139 140 135 135  K 5.9*  --  5.0 4.3 5.0 3.8 3.9  CL 95*  --  94* 97* 99* 96* 94*  CO2 22  --  19* 26 27 27 26   GLUCOSE 345*  --  365* 176* 73 177* 200*  BUN 86*  --  103* 85* 86* 67* 61*  CREATININE 14.27*  --  15.97* 14.08* 14.61* 13.27* 12.65*  ALBUMIN  --  3.2*  --  2.4*  --   --   --   CALCIUM 9.6  --  9.5 8.9 9.2 8.9 8.4*  AST  --  30  --  27  --   --   --   ALT  --  17  --  14*  --   --   --    Liver Function Tests: Recent Labs  Lab 11/24/17 0954 11/25/17 0836  AST 30 27  ALT 17 14*  ALKPHOS 59 49  BILITOT 1.5* 0.7  PROT 6.2* 4.9*  ALBUMIN 3.2* 2.4*   No results for input(s): LIPASE, AMYLASE in the last 168 hours. Recent Labs  Lab 11/24/17 0954  AMMONIA 23   CBC: Recent Labs  Lab 11/24/17 0700  WBC 8.0  NEUTROABS 6.5  HGB 14.4  HCT 43.3  MCV 84.7  PLT 161   Cardiac Enzymes: Recent Labs  Lab 11/24/17 0954  TROPONINI 0.04*   CBG: Recent Labs  Lab 11/26/17 1955 11/26/17 2304  11/27/17 0347 11/27/17 0733 11/27/17 1131  GLUCAP 141* 158* 182* 162* 133*    Iron Studies: No results for input(s): IRON, TIBC, TRANSFERRIN, FERRITIN in the last 72 hours. Studies/Results: No results found. . chlorhexidine  15 mL Mouth Rinse BID  . [START ON 12/01/2017] cloNIDine  0.2 mg Transdermal Weekly  . gentamicin cream  1 application Topical Daily  . heparin  5,000 Units Subcutaneous Q8H  . hydrALAZINE  20 mg Intravenous QID  . insulin aspart  0-9 Units Subcutaneous Q4H  . insulin glargine  10 Units Subcutaneous Daily  . mouth rinse  15 mL Mouth Rinse q12n4p  . sodium chloride flush  3 mL Intravenous Q12H    BMET    Component Value Date/Time   NA 135 11/27/2017 0237   K 3.9 11/27/2017 0237   CL 94 (L) 11/27/2017 0237   CO2 26 11/27/2017  2197   GLUCOSE 200 (H) 11/27/2017 0237   BUN 61 (H) 11/27/2017 0237   CREATININE 12.65 (H) 11/27/2017 0237   CREATININE 10.58 (H) 01/04/2017 1638   CALCIUM 8.4 (L) 11/27/2017 0237   GFRNONAA 4 (L) 11/27/2017 0237   GFRAA 4 (L) 11/27/2017 0237   CBC    Component Value Date/Time   WBC 8.0 11/24/2017 0700   RBC 5.11 11/24/2017 0700   HGB 14.4 11/24/2017 0700   HCT 43.3 11/24/2017 0700   PLT 161 11/24/2017 0700   MCV 84.7 11/24/2017 0700   MCH 28.2 11/24/2017 0700   MCHC 33.3 11/24/2017 0700   RDW 14.5 11/24/2017 0700   LYMPHSABS 1.1 11/24/2017 0700   MONOABS 0.4 11/24/2017 0700   EOSABS 0.0 11/24/2017 0700   BASOSABS 0.0 11/24/2017 0700    Dialysis:CCPD 74kg 6 exchanges total, fill volume 2500, dwell time 1h 61min, last fill 2000, no pause   Assessment/Plan:  1. AMS- likely related to hypertensive urgency as well as metabolic encephalopathy, although he also has a seizure history and may need Neuro input.  1. Markedly improved and per his primary Nephrologist has presented like this twice this year.   2. Appreciate Neuro input.   2. ESRD- continue with CCPD as ordered.  Had been doing well with CCPD at home but has  been admitted 3 times this year with similar presentation.  Will follow his progress and decide whether to proceed with CCPD or transition to HD. 3. Hypertensive urgency- markedly improved with meds 4. Vascular access- has LUE AVF +T/B, unfortunately found BP cuff over AVF this morning.  Nurse placed alert bracelet on left arm. 5. Seizure disorder- on Keppra 6. DM- per primary 7. Disposition- unclear at this time.  He will need to recover his cognitive function to be able to continue with PD at home or change to HD.     Donetta Potts, MD [employer] [phone]

## 2017-11-27 NOTE — Progress Notes (Signed)
PD tx initiated via tenckhoff w/o problem, VSS, report given to Gardiner Fanti, RN

## 2017-11-27 NOTE — Consult Note (Addendum)
NEURO HOSPITALIST CONSULT NOTE   Requestig physician: Dr. Erlinda Hong   Reason for Consult: elevated BP   History obtained from:  chart  HPI:                                                                                                                                          Jared [name] is an 62 y.o. male  Who was brought to hospital due to confusion and lethargy. There is question of  Seizure activity but no witnessed seizure. On arrival his BP was 261/128 , 240/125 and glucose 397. Neurology was asked to see the patient due to his lethargy and somnolence. Looking through notes in the past patient has presented about once a year for either seizure or AMS in setting of hyper glycemia and elevated BP. Since in hospital his BP has remained elevated in the 494 systolic and 49-67 diastolic. EEG has been obtained and showed no electrographic seizures.   Past Medical History:  Diagnosis Date  . Anemia   . Chronic kidney disease    on dialysis M-W-F  . Diabetes mellitus without complication ([place])    onset as adult  . Hypertension   . Seizures ([place])   . Stroke ([place])   . Thyroid disease    hyperparathyroidism    Past Surgical History:  Procedure Laterality Date  . AV FISTULA PLACEMENT Left 06/04/2014   Procedure: ARTERIOVENOUS (AV) FISTULA CREATION-  BRACHIOCEPHALIC WITH LIGATION OF COMPETEING BRANCH;  Surgeon: Mal Misty, MD;  Location: [facility];  Service: Vascular;  Laterality: Left;  . INSERTION OF DIALYSIS CATHETER  04/2014  . IR GENERIC HISTORICAL Left 11/30/2016   IR THROMBECTOMY AV FISTULA W/THROMBOLYSIS/PTA/STENT INC/SHUNT/IMG LT 11/30/2016 Markus Daft, MD [facility]  . IR GENERIC HISTORICAL  11/30/2016   IR US GUIDE VASC ACCESS LEFT 11/30/2016 Markus Daft, MD [facility]    Family History  Problem Relation Age of Onset  . Cancer - Lung Mother   . Cancer - Other Father   . Diabetes Neg Hx      Social History:  reports that he quit smoking about 33 years ago. he  has never used smokeless tobacco. He reports that he drinks about 0.6 oz of alcohol per week. He reports that he does not use drugs.  Allergies  Allergen Reactions  . Penicillins Other (See Comments)    Has patient had a PCN reaction causing immediate rash, facial/tongue/throat swelling, SOB or lightheadedness with hypotension: Unk Has patient had a PCN reaction causing severe rash involving mucus membranes or skin necrosis: Unk Has patient had a PCN reaction that required hospitalization: Unk Has patient had a PCN reaction occurring within the last 10 years: Unk If all of the above answers are "NO", then may proceed with Cephalosporin use.  MEDICATIONS:                                                                                                                     Scheduled: . chlorhexidine  15 mL Mouth Rinse BID  . [START ON 12/01/2017] cloNIDine  0.2 mg Transdermal Weekly  . gentamicin cream  1 application Topical Daily  . heparin  5,000 Units Subcutaneous Q8H  . hydrALAZINE  20 mg Intravenous QID  . insulin aspart  0-9 Units Subcutaneous Q4H  . insulin glargine  10 Units Subcutaneous Daily  . [START ON 11/28/2017] levETIRAcetam  250 mg Oral Daily  . mouth rinse  15 mL Mouth Rinse q12n4p  . sodium chloride flush  3 mL Intravenous Q12H   Continuous: . sodium chloride    . dialysis solution 2.5% low-MG/low-CA    . nitroGLYCERIN 120 mcg/min (11/27/17 0401)   ZOX:WRUEAV chloride, dianeal solution for CAPD/CCPD with heparin, labetalol, ondansetron (ZOFRAN) IV, sodium chloride flush   ROS:                                                                                                                                       History obtained from wife  General ROS: negative for - chills, fatigue, fever, night sweats, weight gain or weight loss Psychological ROS: negative for - behavioral disorder, hallucinations, memory difficulties, mood swings or suicidal ideation Ophthalmic ROS:  negative for - blurry vision, double vision, eye pain or loss of vision ENT ROS: negative for - epistaxis, nasal discharge, oral lesions, sore throat, tinnitus or vertigo Allergy and Immunology ROS: negative for - hives or itchy/watery eyes Hematological and Lymphatic ROS: negative for - bleeding problems, bruising or swollen lymph nodes Endocrine ROS: negative for - galactorrhea, hair pattern changes, polydipsia/polyuria or temperature intolerance Respiratory ROS: negative for - cough, hemoptysis, shortness of breath or wheezing Cardiovascular ROS: negative for - chest pain, dyspnea on exertion, edema or irregular heartbeat Gastrointestinal ROS: negative for - abdominal pain, diarrhea, hematemesis, nausea/vomiting or stool incontinence Genito-Urinary ROS: negative for - dysuria, hematuria, incontinence or urinary frequency/urgency Musculoskeletal ROS: negative for - joint swelling or muscular weakness Neurological ROS: as noted in HPI Dermatological ROS: negative for rash and skin lesion changes   Blood pressure (!) 181/97, pulse 96, temperature 98.7 F (37.1 C), temperature source Oral, resp. rate 16, height 5\' 6"  (1.676 m), weight 68.4 kg (150 lb 12.7 oz), SpO2 99 %.  Neurologic Examination:                                                                                                      HEENT-  Normocephalic, no lesions, without obvious abnormality.  Normal external eye and conjunctiva.  Normal TM's bilaterally.  Normal auditory canals and external ears. Normal external nose, mucus membranes and septum.  Normal pharynx. Cardiovascular- S1, S2 normal, pulses palpable throughout   Lungs- chest clear, no wheezing, rales, normal symmetric air entry Abdomen- normal findings: bowel sounds normal Extremities- no edema Lymph-no adenopathy palpable Musculoskeletal-no joint tenderness, deformity or swelling Skin-warm and dry, no hyperpigmentation, vitiligo, or suspicious  lesions  Neurological Examination Mental Status: Alert, oriented, thought content appropriate.  Speech fluent without evidence of aphasia.  Able to follow 3 step commands without difficulty. Cranial Nerves: II:  Visual fields grossly normal,  III,IV, VI: ptosis not present, extra-ocular motions intact bilaterally pupils equal, round, reactive to light and accommodation V,VII: smile symmetric, facial light touch sensation normal bilaterally VIII: hearing normal bilaterally IX,X: uvula rises symmetrically XI: bilateral shoulder shrug XII: midline tongue extension Motor: Moving all extremities antigravity  Sensory: Pinprick and light touch intact throughout, bilaterally Deep Tendon Reflexes: 2+ and symmetric throughout Plantars: Right: downgoing   Left: downgoing Cerebellar: normal finger-to-nose, and normal heel-to-shin test Gait: not tested      Lab Results: Basic Metabolic Panel: Recent Labs  Lab 11/24/17 1908 11/25/17 0836 11/25/17 1555 11/26/17 1135 11/27/17 0237  NA 139 139 140 135 135  K 5.0 4.3 5.0 3.8 3.9  CL 94* 97* 99* 96* 94*  CO2 19* 26 27 27 26   GLUCOSE 365* 176* 73 177* 200*  BUN 103* 85* 86* 67* 61*  CREATININE 15.97* 14.08* 14.61* 13.27* 12.65*  CALCIUM 9.5 8.9 9.2 8.9 8.4*    Liver Function Tests: Recent Labs  Lab 11/24/17 0954 11/25/17 0836  AST 30 27  ALT 17 14*  ALKPHOS 59 49  BILITOT 1.5* 0.7  PROT 6.2* 4.9*  ALBUMIN 3.2* 2.4*   No results for input(s): LIPASE, AMYLASE in the last 168 hours. Recent Labs  Lab 11/24/17 0954  AMMONIA 23    CBC: Recent Labs  Lab 11/24/17 0700  WBC 8.0  NEUTROABS 6.5  HGB 14.4  HCT 43.3  MCV 84.7  PLT 161    Cardiac Enzymes: Recent Labs  Lab 11/24/17 0954  TROPONINI 0.04*    Lipid Panel: No results for input(s): CHOL, TRIG, HDL, CHOLHDL, VLDL, LDLCALC in the last 168 hours.  CBG: Recent Labs  Lab 11/26/17 2304 11/27/17 0347 11/27/17 0733 11/27/17 1131 11/27/17 1556  GLUCAP  158* 182* 162* 133* 62    Microbiology: Results for orders placed or performed during the hospital encounter of 11/24/17  MRSA PCR Screening     Status: None   Collection Time: 11/24/17  7:16 PM  Result Value Ref Range Status   MRSA by PCR NEGATIVE NEGATIVE Final    Comment:        The GeneXpert MRSA Assay (FDA approved for NASAL specimens only), is one component of a  comprehensive MRSA colonization surveillance program. It is not intended to diagnose MRSA infection nor to guide or monitor treatment for MRSA infections.   Culture, body fluid-bottle     Status: None (Preliminary result)   Collection Time: 11/25/17  8:30 AM  Result Value Ref Range Status   Specimen Description PERITONEAL  Final   Special Requests NONE  Final   Culture NO GROWTH 2 DAYS  Final   Report Status PENDING  Incomplete  Gram stain     Status: None   Collection Time: 11/25/17  8:30 AM  Result Value Ref Range Status   Specimen Description PERITONEAL  Final   Special Requests NONE  Final   Gram Stain   Final    WBC PRESENT, PREDOMINANTLY MONONUCLEAR NO ORGANISMS SEEN CYTOSPIN SMEAR    Report Status 11/25/2017 FINAL  Final    Coagulation Studies: No results for input(s): LABPROT, INR in the last 72 hours.  Imaging: Mr Brain Wo Contrast  Result Date: 11/27/2017 CLINICAL DATA:  Altered level of consciousness.  Possible seizure. EXAM: MRI HEAD WITHOUT CONTRAST TECHNIQUE: Multiplanar, multiecho pulse sequences of the brain and surrounding structures were obtained without intravenous contrast. COMPARISON:  CT head without contrast 11/24/2017. MRI brain 03/17/2017 FINDINGS: Brain: The study is mildly degraded by patient motion. Diffusion-weighted images demonstrate no acute or subacute infarction. Mild generalized atrophy is stable. No significant white matter disease is present. The ventricles are proportionate to the degree of atrophy. The internal auditory canals are within normal limits. The brainstem and  cerebellum are normal. Vascular: Flow is present in the major intracranial arteries. Skull and upper cervical spine: The skullbase is within normal limits. The craniocervical junction is normal. Marrow signal is normal. Sinuses/Orbits: Circumferential mucosal thickening is present in the maxillary sinuses bilaterally, right greater than left. The remaining paranasal sinuses and the mastoid air cells are clear. The globes and orbits are within normal limits. IMPRESSION: 1. No acute intracranial abnormality or significant interval change. 2. The study is mildly degraded by patient motion. 3. Age advanced atrophy. 4. 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 Electronically Signed   By: San Morelle M.D.   On: 11/27/2017 16:47   Dg Abd Portable 1v  Result Date: 11/27/2017 CLINICAL DATA:  Nausea vomiting EXAM: PORTABLE ABDOMEN - 1 VIEW COMPARISON:  CT abdomen pelvis March 15, 2017 FINDINGS: The bowel gas pattern is normal. A pigtail drainage tube is projected over the left abdomen. No radio-opaque calculi or other significant radiographic abnormality are seen. IMPRESSION: No bowel obstruction.  Drainage tube projected over left abdomen. Electronically Signed   By: Abelardo Diesel M.D.   On: 11/27/2017 16:04       Assessment and plan per attending neurologist  Etta Quill PA-C [facility] [phone]  11/27/2017, 5:05 PM     NEUROHOSPITALIST ADDENDUM Seen and examined the patient this evening.  Assessment and Recommendation as follows   ASSESSMENT AND PLAN    62 y.o. male with a history of diabetes mellitus, uncontrolled hypertension, hyperlipidemia, and end-stage renal disease on peritoneal dialysis, history of seizures presents with confusion and lethargy. There is question of  seizure activity but apparently no witnessed seizure. On arrival his BP was 261/128 , 240/125 and glucose 397.  Seizure history: The patient was admitted after having seizures in  December 2017 and March 2018 both times occurring in the setting of severe hypertension and hyperglycemic hyperosmolar state with glucose ranging from [phone]. He has had 2 routine EEGs which did not show  epileptiform activity and MRI brain which has been normal. Seizures thought to be provoked however the patient was started on low-dose and renally dosed Keppra 250 twice a day as a prophylaxis with eventual plan to taper and discontinue. Patient's extremely high blood pressures are not due to seizure, rather vice versa.   Impression:   Hypertensive Crisis and Encephalopathy Possible Seizure   - EEG did not show nonconvulsive electrographic seizures, consistent with encephalopathy - MRI brain performed yesterday does not show PRES  Plan:  --Can take days to weeks to return to baseline.  --BP control with goal systolic [id number]/MQTTCNGFR 43-20 -- control hyperglycemia --decrease all sedating medications.  --continue dialysis --continue to address all metabolic issues.  - Continue keppra at 250mg  BID - Consider workup for secondary causes of hypertension such as renal artery stenosis, pheochromocytoma if not already evaluated in the past. - Seizure precautions on discharge  Sushanth Aroor MD [facility] [phone]  If 7pm to 7am, please call on call as listed on AMION.

## 2017-11-28 DIAGNOSIS — N186 End stage renal disease: Secondary | ICD-10-CM | POA: Diagnosis not present

## 2017-11-28 DIAGNOSIS — R82998 Other abnormal findings in urine: Secondary | ICD-10-CM | POA: Diagnosis not present

## 2017-11-28 DIAGNOSIS — D509 Iron deficiency anemia, unspecified: Secondary | ICD-10-CM | POA: Diagnosis not present

## 2017-11-28 DIAGNOSIS — K769 Liver disease, unspecified: Secondary | ICD-10-CM | POA: Diagnosis not present

## 2017-11-28 DIAGNOSIS — N2589 Other disorders resulting from impaired renal tubular function: Secondary | ICD-10-CM | POA: Diagnosis not present

## 2017-11-28 DIAGNOSIS — N2581 Secondary hyperparathyroidism of renal origin: Secondary | ICD-10-CM | POA: Diagnosis not present

## 2017-11-28 LAB — GLUCOSE, CAPILLARY
GLUCOSE-CAPILLARY: 211 mg/dL — AB (ref 65–99)
GLUCOSE-CAPILLARY: 140 mg/dL — AB (ref 65–99)
GLUCOSE-CAPILLARY: 212 mg/dL — AB (ref 65–99)
Glucose-Capillary: 203 mg/dL — ABNORMAL HIGH (ref 65–99)
Glucose-Capillary: 224 mg/dL — ABNORMAL HIGH (ref 65–99)
Glucose-Capillary: 69 mg/dL (ref 65–99)

## 2017-11-28 LAB — RENAL FUNCTION PANEL
Albumin: 2.1 g/dL — ABNORMAL LOW (ref 3.5–5.0)
Anion gap: 11 (ref 5–15)
BUN: 55 mg/dL — AB (ref 6–20)
CALCIUM: 8.4 mg/dL — AB (ref 8.9–10.3)
CHLORIDE: 96 mmol/L — AB (ref 101–111)
CO2: 27 mmol/L (ref 22–32)
CREATININE: 12.03 mg/dL — AB (ref 0.61–1.24)
GFR, EST AFRICAN AMERICAN: 4 mL/min — AB (ref >60)
GFR, EST NON AFRICAN AMERICAN: 4 mL/min — AB (ref >60)
Glucose, Bld: 187 mg/dL — ABNORMAL HIGH (ref 65–99)
Phosphorus: 4.8 mg/dL — ABNORMAL HIGH (ref 2.5–4.6)
Potassium: 3.9 mmol/L (ref 3.5–5.1)
SODIUM: 134 mmol/L — AB (ref 135–145)

## 2017-11-28 LAB — HEPATITIS B SURFACE ANTIGEN: HEP B S AG: NEGATIVE

## 2017-11-28 LAB — PSA: Prostatic Specific Antigen: 155 ng/mL — ABNORMAL HIGH (ref 0.00–4.00)

## 2017-11-28 MED ORDER — LORATADINE 10 MG PO TABS
10.0000 mg | ORAL_TABLET | Freq: Every day | ORAL | Status: DC
  Administered 2017-11-29 – 2017-12-01 (×3): 10 mg via ORAL
  Filled 2017-11-28 (×4): qty 1

## 2017-11-28 MED ORDER — MECLIZINE HCL 12.5 MG PO TABS
12.5000 mg | ORAL_TABLET | Freq: Three times a day (TID) | ORAL | Status: DC | PRN
  Administered 2017-11-29: 12.5 mg via ORAL
  Filled 2017-11-28 (×2): qty 1

## 2017-11-28 MED ORDER — PANTOPRAZOLE SODIUM 40 MG PO TBEC
40.0000 mg | DELAYED_RELEASE_TABLET | Freq: Every day | ORAL | Status: DC
  Administered 2017-11-28 – 2017-12-01 (×4): 40 mg via ORAL
  Filled 2017-11-28 (×4): qty 1

## 2017-11-28 MED ORDER — FLUTICASONE PROPIONATE 50 MCG/ACT NA SUSP
1.0000 | Freq: Every day | NASAL | Status: DC
  Administered 2017-11-28 – 2017-12-01 (×4): 1 via NASAL
  Filled 2017-11-28: qty 16

## 2017-11-28 MED ORDER — HYDRALAZINE HCL 50 MG PO TABS
100.0000 mg | ORAL_TABLET | Freq: Three times a day (TID) | ORAL | Status: DC
  Administered 2017-11-28 – 2017-12-01 (×9): 100 mg via ORAL
  Filled 2017-11-28 (×11): qty 2

## 2017-11-28 MED ORDER — AMLODIPINE BESYLATE 10 MG PO TABS
10.0000 mg | ORAL_TABLET | Freq: Every day | ORAL | Status: DC
  Administered 2017-11-29 – 2017-12-01 (×3): 10 mg via ORAL
  Filled 2017-11-28 (×4): qty 1

## 2017-11-28 NOTE — Progress Notes (Addendum)
PT Cancellation Note  Patient Details Name: Jared [name] MRN: [id number] DOB: 15-Jun-1956   Cancelled Treatment:    Reason Eval/Treat Not Completed: Pain limiting ability to participate. Pt declining mobility secondary to pain and acid reflux, despite education and encouragement. Will follow-up for PT mobility evaluation tomorrow.  [name], PT, DPT Acute Rehab Services  Pager: [phone] 11/28/2017, 3:47 PM

## 2017-11-28 NOTE — Progress Notes (Signed)
Patient ID: Jared [name], male   DOB: [birth year], 62 y.o.   MRN: [id number]  [facility] Progress Note    Subjective:   Much more awake and alert.  Admits that he may not have "been doing right" prior to his admission regarding his medications but reports that he was doing his PD.   Objective:   BP 126/72   Pulse (!) 101   Temp 98.7 F (37.1 C)   Resp 18   Ht 5\' 6"  (1.676 m)   Wt 64 kg (141 lb 1.5 oz)   SpO2 100%   BMI 22.77 kg/m   Intake/Output: I/O last 3 completed shifts: In: 3 [I.V.:3] Out: 400 [Urine:400]   Intake/Output this shift:  Total I/O In: -  Out: 14992 [Other:14992] Weight change: -2.7 kg (-15.2 oz)  Physical Exam: Gen:NAD CVS: tachy Resp: cta Abd: +BS, distended, PD cath in place Ext: no edema, LUE AVF +T/B  Labs: BMET Recent Labs  Lab 11/24/17 0700 11/24/17 0954 11/24/17 1908 11/25/17 0836 11/25/17 1555 11/26/17 1135 11/27/17 0237 11/28/17 0322  NA 139  --  139 139 140 135 135 134*  K 5.9*  --  5.0 4.3 5.0 3.8 3.9 3.9  CL 95*  --  94* 97* 99* 96* 94* 96*  CO2 22  --  19* 26 27 27 26 27   GLUCOSE 345*  --  365* 176* 73 177* 200* 187*  BUN 86*  --  103* 85* 86* 67* 61* 55*  CREATININE 14.27*  --  15.97* 14.08* 14.61* 13.27* 12.65* 12.03*  ALBUMIN  --  3.2*  --  2.4*  --   --   --  2.1*  CALCIUM 9.6  --  9.5 8.9 9.2 8.9 8.4* 8.4*  PHOS  --   --   --   --   --   --   --  4.8*   CBC Recent Labs  Lab 11/24/17 0700  WBC 8.0  NEUTROABS 6.5  HGB 14.4  HCT 43.3  MCV 84.7  PLT 161    @IMGRELPRIORS @ Medications:    . amLODipine  10 mg Oral Daily  . chlorhexidine  15 mL Mouth Rinse BID  . [START ON 12/01/2017] cloNIDine  0.2 mg Transdermal Weekly  . gentamicin cream  1 application Topical Daily  . heparin  5,000 Units Subcutaneous Q8H  . hydrALAZINE  100 mg Oral Q8H  . insulin aspart  0-9 Units Subcutaneous Q4H  . insulin glargine  10 Units Subcutaneous Daily  . levETIRAcetam  250 mg Oral Daily  . mouth rinse  15 mL  Mouth Rinse q12n4p  . sodium chloride flush  3 mL Intravenous Q12H   Dialysis:CCPD 74kg 6 exchanges total, fill volume 2500, dwell time 1h 55min, last fill 2000, no pause   Assessment/ Plan:   1. AMS- likely related to hypertensive urgency as well as metabolic encephalopathy, although he also has a seizure history and may need Neuro input.  1. Markedly improved and per his primary Nephrologist has presented like this twice this year.   2. Appreciate Neuro input.  3. Resume po hydralazine and will add amlodipine 10mg  to meds and follow BP with goal <140/80 2. ESRD- continue with CCPD as ordered. Had been doing well with CCPD at home but has been admitted 3 times this year with similar presentation.  Will follow his progress and decide whether to proceed with CCPD or transition to HD. 3. Hypertensive urgency- markedly improved with meds 4. Vascular access- has LUE  AVF +T/B, unfortunately found BP cuff over AVF this morning. Nurse placed alert bracelet on left arm. 5. Seizure disorder- on Keppra 6. DM- per primary 7. Disposition- unclear at this time. He has recovered his cognitive function and want to continue with PD at home for now however I discussed with him (and with Dr. Lorrene Reid who was present and in agreement) that if he had another episode like this he would have to change to in-center HD.  He would likely benefit from [facility] to assist with polypharmacy in the elderly and help him manage his medications.    Donetta Potts, MD [place] Pager [phone] 11/28/2017, 10:26 AM

## 2017-11-28 NOTE — Progress Notes (Signed)
Nutrition Brief Note  RD received consult for diet education.   Pt is currently on a renal/carb modified diet, on PD.   Case discussed with RN, who reports pt has been experiencing ongoing nausea and intermittent confusion. Pt was given zofran and is very sleepy; pt would be unable to engage in conversation and retain education at time of visit. RN reports pt is not appropriate for education at this time.   RD will follow-up to address education needs when pt becomes more stable.   Jarrah Seher A. Jimmye Norman, RD, LDN, CDE Pager: [phone] After hours Pager: [phone]

## 2017-11-28 NOTE — Progress Notes (Signed)
PROGRESS NOTE  Jared [name] YSA:[id number] DOB: Jun 09, 1956 DOA: 11/24/2017 PCP: Girtha Rm, NP-C   Brief summary:   EMS from Home, pt found in lethargic state by family this AM. Family suspects seizure activity but actual seizure was not witnessed. No oral trauma noted. Pt on home dialysis every night and was unable to do so yesterday.  vital on arrival  To [facility] ED: BP 261/128 P 84 RR 16 GCS 13 CBG 397  Jared [name] is a 62 y.o. male with medical history of peritoneal dialysis, DM, HTN, seizures presents with confusion. His sister gives the history. She lives with him and has noted him complain of abdominal discomfort and with a poor appetite for about 4-5 days now. He has had some nausea. She states it is not uncommon for him to have GI issues like this. Yesterday, she noted at about 4 AM, he was quite confused. He did not know who she was. She states he skipped his dialysis yesterday evening which he does not usually do. He is generally quite independent and takes reasonably good care of himself.  In the ER, he has been confused and agitated and currently is calm after Ativan.   ED Course:  BP 240/125 K 5.9 Cr 14 Given Labetalol for HTN and Ativan for agitation - head CT negative    HPI/Recap of past 24 hours:  Report feeling dizzy with movement, left ear ringing,  He has significant horizontal nystagmus.  He denies pain, no fever, family at bedside  PD at bedside  Assessment/Plan: Principal Problem:   Metabolic encephalopathy Active Problems:   CKD (chronic kidney disease) stage V requiring chronic dialysis (HCC)   Anemia of chronic disease   Accelerated hypertension   Type 2 diabetes mellitus with peripheral neuropathy (HCC)   Acute metabolic encephalopathy  Encephalopathy:  -per chart review, he has similar presentation 3 times in the past with encephalopathy, very elevated blood pressure and very elevated blood sugar around 1000, he reports  compliance with his meds and PD -Family denies seizure activity, family reports home meds keppra was recently discontinued.  -encephalopathy Likely metabolic, with hypertension urgency, elevated blood sugar (>1000), bun, acidosis. - uds negative, alcohol level less than 10, ct head no acute findings, ammonia level unremarkable, ua unremarkable, no fever, no leukocytosis, does not appear to have infection , pd fluids cell counts wbc 2, culture no growth -EEG consistent with metabolic encephalopathy, no seizure spikes -mri brain no acute findings, does show chronic sinusitis.  -continue keppra, neurology input appreciated  Hypertension urgency: -BP 261/128 on arrival, he is started on nitrodrip, stepdown admission He is also on clonidine patch, scheduled iv hydralazine bp better, Nephrology following  ESRD On CPPD: nephrology consulted, will follow recommendations.   Insulin dependent DM2, a1c 8.9, adjust insulin.  He presented with hyperglycemia, elevated anion gap of 26 on admission, but also has ESRD.  anion gap has improved, down from 26 to 14,  Continue monitor  bmp  Had hypoglycemia episode while npo, decrease lantus, continue ssi, on hypoglycemia protocol.   H/o ?seizure:  -per family denies seizure activity. family reports home meds keppra was recently discontinued.  - no seizure activity observed in the hospital so far,  -eeg no seizure spikes, seizure precaution,  -he is started on iv keppra, transitioned to orla keppra. -neurology input appreciated.   H/o prostate cancer, he reports getting shots q66months.  N/V:  -family reports patient has intermittent n/v, poor appetite, wonder component of gastroparesis,  kub no acute findings.  - he also reports left ear ringing and feeling dizzy with headmovement , reports symptom started a week ago. Suspect  Recent n/v related to inner ear problem. Mri brain did confirm sinusitis. Will start flonase, claritin, prn meclizine, PT eval,  will need outpatient ENT follow up and possible vestibular rehab  There is a question with meds compliance, but sister who patient lives with brought in pill bottles. It seems patient has been taking his meds. Both sister are great support for him.   Code Status: full  Family Communication: patient and family at bedside  Disposition Plan: remain in stepdown, oral intake still not reliable due to dizziness and intermittent n/v.    Consultants:  Nephrology  neurology  Procedures:  PD  Antibiotics:  none   Objective: BP 126/72   Pulse (!) 101   Temp 98.4 F (36.9 C) (Oral)   Resp 18   Ht 5\' 6"  (1.676 m)   Wt 64 kg (141 lb 1.5 oz)   SpO2 100%   BMI 22.77 kg/m   Intake/Output Summary (Last 24 hours) at 11/28/2017 1307 Last data filed at 11/28/2017 1610 Gross per 24 hour  Intake 243 ml  Output 15292 ml  Net -15049 ml   Filed Weights   11/27/17 0344 11/27/17 1905 11/28/17 0810  Weight: 68.4 kg (150 lb 12.7 oz) 64.5 kg (142 lb 3.2 oz) 64 kg (141 lb 1.5 oz)    Exam: Patient is examined daily including today on 11/28/2017, exams remain the same as of yesterday except that has changed    General:  Aaox3, + horizontal nystagmus when he turns to the right. He could not complete exam due to significant dizziness with movement and has projectile vomiting  Cardiovascular: RRR  Respiratory: CTABL  Abdomen: Soft/ND/NT, positive BS  Musculoskeletal: No Edema  Neuro: aaox3   Data Reviewed: Basic Metabolic Panel: Recent Labs  Lab 11/25/17 0836 11/25/17 1555 11/26/17 1135 11/27/17 0237 11/28/17 0322  NA 139 140 135 135 134*  K 4.3 5.0 3.8 3.9 3.9  CL 97* 99* 96* 94* 96*  CO2 26 27 27 26 27   GLUCOSE 176* 73 177* 200* 187*  BUN 85* 86* 67* 61* 55*  CREATININE 14.08* 14.61* 13.27* 12.65* 12.03*  CALCIUM 8.9 9.2 8.9 8.4* 8.4*  PHOS  --   --   --   --  4.8*   Liver Function Tests: Recent Labs  Lab 11/24/17 0954 11/25/17 0836 11/28/17 0322  AST 30 27  --     ALT 17 14*  --   ALKPHOS 59 49  --   BILITOT 1.5* 0.7  --   PROT 6.2* 4.9*  --   ALBUMIN 3.2* 2.4* 2.1*   No results for input(s): LIPASE, AMYLASE in the last 168 hours. Recent Labs  Lab 11/24/17 0954  AMMONIA 23   CBC: Recent Labs  Lab 11/24/17 0700  WBC 8.0  NEUTROABS 6.5  HGB 14.4  HCT 43.3  MCV 84.7  PLT 161   Cardiac Enzymes:   Recent Labs  Lab 11/24/17 0954  TROPONINI 0.04*   BNP (last 3 results) No results for input(s): BNP in the last 8760 hours.  ProBNP (last 3 results) No results for input(s): PROBNP in the last 8760 hours.  CBG: Recent Labs  Lab 11/27/17 1810 11/27/17 2014 11/27/17 2326 11/28/17 0412 11/28/17 0742  GLUCAP 78 189* 294* 203* 224*    Recent Results (from the past 240 hour(s))  MRSA PCR Screening  Status: None   Collection Time: 11/24/17  7:16 PM  Result Value Ref Range Status   MRSA by PCR NEGATIVE NEGATIVE Final    Comment:        The GeneXpert MRSA Assay (FDA approved for NASAL specimens only), is one component of a comprehensive MRSA colonization surveillance program. It is not intended to diagnose MRSA infection nor to guide or monitor treatment for MRSA infections.   Culture, body fluid-bottle     Status: None (Preliminary result)   Collection Time: 11/25/17  8:30 AM  Result Value Ref Range Status   Specimen Description PERITONEAL  Final   Special Requests NONE  Final   Culture NO GROWTH 2 DAYS  Final   Report Status PENDING  Incomplete  Gram stain     Status: None   Collection Time: 11/25/17  8:30 AM  Result Value Ref Range Status   Specimen Description PERITONEAL  Final   Special Requests NONE  Final   Gram Stain   Final    WBC PRESENT, PREDOMINANTLY MONONUCLEAR NO ORGANISMS SEEN CYTOSPIN SMEAR    Report Status 11/25/2017 FINAL  Final     Studies: Mr Brain Wo Contrast  Result Date: 11/27/2017 CLINICAL DATA:  Altered level of consciousness.  Possible seizure. EXAM: MRI HEAD WITHOUT CONTRAST  TECHNIQUE: Multiplanar, multiecho pulse sequences of the brain and surrounding structures were obtained without intravenous contrast. COMPARISON:  CT head without contrast 11/24/2017. MRI brain 03/17/2017 FINDINGS: Brain: The study is mildly degraded by patient motion. Diffusion-weighted images demonstrate no acute or subacute infarction. Mild generalized atrophy is stable. No significant white matter disease is present. The ventricles are proportionate to the degree of atrophy. The internal auditory canals are within normal limits. The brainstem and cerebellum are normal. Vascular: Flow is present in the major intracranial arteries. Skull and upper cervical spine: The skullbase is within normal limits. The craniocervical junction is normal. Marrow signal is normal. Sinuses/Orbits: Circumferential mucosal thickening is present in the maxillary sinuses bilaterally, right greater than left. The remaining paranasal sinuses and the mastoid air cells are clear. The globes and orbits are within normal limits. IMPRESSION: 1. No acute intracranial abnormality or significant interval change. 2. The study is mildly degraded by patient motion. 3. Age advanced atrophy. 4. 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 Electronically Signed   By: San Morelle M.D.   On: 11/27/2017 16:47   Dg Abd Portable 1v  Result Date: 11/27/2017 CLINICAL DATA:  Nausea vomiting EXAM: PORTABLE ABDOMEN - 1 VIEW COMPARISON:  CT abdomen pelvis March 15, 2017 FINDINGS: The bowel gas pattern is normal. A pigtail drainage tube is projected over the left abdomen. No radio-opaque calculi or other significant radiographic abnormality are seen. IMPRESSION: No bowel obstruction.  Drainage tube projected over left abdomen. Electronically Signed   By: Abelardo Diesel M.D.   On: 11/27/2017 16:04    Scheduled Meds: . amLODipine  10 mg Oral Daily  . chlorhexidine  15 mL Mouth Rinse BID  . [START ON 12/01/2017] cloNIDine  0.2 mg  Transdermal Weekly  . fluticasone  1 spray Each Nare Daily  . gentamicin cream  1 application Topical Daily  . heparin  5,000 Units Subcutaneous Q8H  . hydrALAZINE  100 mg Oral Q8H  . insulin aspart  0-9 Units Subcutaneous Q4H  . insulin glargine  10 Units Subcutaneous Daily  . levETIRAcetam  250 mg Oral Daily  . loratadine  10 mg Oral Daily  . mouth rinse  15 mL  Mouth Rinse q12n4p  . sodium chloride flush  3 mL Intravenous Q12H    Continuous Infusions: . sodium chloride    . dialysis solution 2.5% low-MG/low-CA    . nitroGLYCERIN 120 mcg/min (11/28/17 0800)     Time spent: 35 mins I have personally reviewed and interpreted on  11/28/2017 daily labs, tele strips, imagings as discussed above under date review session and assessment and plans.  I reviewed all nursing notes, pharmacy notes, consultant notes,  vitals, pertinent old records  I have discussed plan of care as described above with RN , patient and family on 11/28/2017   Florencia Reasons MD, PhD  [facility] Pager [phone]. If 7PM-7AM, please contact night-coverage at [email or web address], password [facility] 11/28/2017, 1:07 PM  LOS: 4 days

## 2017-11-28 NOTE — Progress Notes (Signed)
Inpatient Diabetes Program Recommendations  AACE/ADA: New Consensus Statement on Inpatient Glycemic Control (2015)  Target Ranges:  Prepandial:   less than 140 mg/dL      Peak postprandial:   less than 180 mg/dL (1-2 hours)      Critically ill patients:  140 - 180 mg/dL   Lab Results  Component Value Date   GLUCAP 224 (H) 11/28/2017   HGBA1C 8.9 (H) 11/26/2017    Review of Glycemic Control Results for Jared [name], Jared [name] (MRN [id number]) as of 11/28/2017 09:17  Ref. Range 11/25/2017 08:36 11/25/2017 15:55 11/26/2017 11:35 11/27/2017 02:37 11/28/2017 03:22  Glucose Latest Ref Range: 65 - 99 mg/dL 176 (H) 73 177 (H) 200 (H) 187 (H)   Diabetes history: Type 2 DM Outpatient Diabetes medications: NPH 30 Units Q AM Current orders for Inpatient glycemic control: Lantus 10 Units QD, Novolog 0-9 Units Q4H  Inpatient Diabetes Program Recommendations:     Noted decrease of Lantus to 10 Units from 20 Units due to hypoglycemic episode. Patient now eating carb modified diet. Consider Novolog 0-9 TID with meals and Novolog 0-5 Units HS. Also, consider adding Novolog 3 Units TID with meals and increasing Lantus to 15 Units QD.  Thanks, Bronson Curb, MSN, RNC-OB Diabetes Coordinator [phone] (8a-5p)

## 2017-11-29 DIAGNOSIS — N2589 Other disorders resulting from impaired renal tubular function: Secondary | ICD-10-CM | POA: Diagnosis not present

## 2017-11-29 DIAGNOSIS — E1142 Type 2 diabetes mellitus with diabetic polyneuropathy: Secondary | ICD-10-CM

## 2017-11-29 DIAGNOSIS — R4182 Altered mental status, unspecified: Secondary | ICD-10-CM

## 2017-11-29 DIAGNOSIS — N186 End stage renal disease: Secondary | ICD-10-CM | POA: Diagnosis not present

## 2017-11-29 DIAGNOSIS — D509 Iron deficiency anemia, unspecified: Secondary | ICD-10-CM | POA: Diagnosis not present

## 2017-11-29 DIAGNOSIS — K769 Liver disease, unspecified: Secondary | ICD-10-CM | POA: Diagnosis not present

## 2017-11-29 DIAGNOSIS — N2581 Secondary hyperparathyroidism of renal origin: Secondary | ICD-10-CM | POA: Diagnosis not present

## 2017-11-29 DIAGNOSIS — R82998 Other abnormal findings in urine: Secondary | ICD-10-CM | POA: Diagnosis not present

## 2017-11-29 DIAGNOSIS — R112 Nausea with vomiting, unspecified: Secondary | ICD-10-CM

## 2017-11-29 LAB — GLUCOSE, CAPILLARY
GLUCOSE-CAPILLARY: 185 mg/dL — AB (ref 65–99)
Glucose-Capillary: 338 mg/dL — ABNORMAL HIGH (ref 65–99)
Glucose-Capillary: 236 mg/dL — ABNORMAL HIGH (ref 65–99)
Glucose-Capillary: 89 mg/dL (ref 65–99)
Glucose-Capillary: 181 mg/dL — ABNORMAL HIGH (ref 65–99)
Glucose-Capillary: 252 mg/dL — ABNORMAL HIGH (ref 65–99)

## 2017-11-29 LAB — CBC WITH DIFFERENTIAL/PLATELET
BASOS ABS: 0 10*3/uL (ref 0.0–0.1)
Basophils Relative: 0 %
Eosinophils Absolute: 0.3 10*3/uL (ref 0.0–0.7)
Eosinophils Relative: 4 %
HCT: 33.7 % — ABNORMAL LOW (ref 39.0–52.0)
Hemoglobin: 11.3 g/dL — ABNORMAL LOW (ref 13.0–17.0)
LYMPHS PCT: 22 %
Lymphs Abs: 1.4 10*3/uL (ref 0.7–4.0)
MCH: 28.4 pg (ref 26.0–34.0)
MCHC: 33.5 g/dL (ref 30.0–36.0)
MCV: 84.7 fL (ref 78.0–100.0)
Monocytes Absolute: 0.8 10*3/uL (ref 0.1–1.0)
Monocytes Relative: 13 %
NEUTROS ABS: 4 10*3/uL (ref 1.7–7.7)
Neutrophils Relative %: 61 %
Platelets: 210 10*3/uL (ref 150–400)
RBC: 3.98 MIL/uL — AB (ref 4.22–5.81)
RDW: 14.7 % (ref 11.5–15.5)
WBC: 6.6 10*3/uL (ref 4.0–10.5)

## 2017-11-29 LAB — RENAL FUNCTION PANEL
ALBUMIN: 2.2 g/dL — AB (ref 3.5–5.0)
Anion gap: 12 (ref 5–15)
BUN: 49 mg/dL — ABNORMAL HIGH (ref 6–20)
CALCIUM: 8.2 mg/dL — AB (ref 8.9–10.3)
CO2: 30 mmol/L (ref 22–32)
Chloride: 89 mmol/L — ABNORMAL LOW (ref 101–111)
Creatinine, Ser: 12.17 mg/dL — ABNORMAL HIGH (ref 0.61–1.24)
GFR, EST AFRICAN AMERICAN: 4 mL/min — AB (ref >60)
GFR, EST NON AFRICAN AMERICAN: 4 mL/min — AB (ref >60)
GLUCOSE: 322 mg/dL — AB (ref 65–99)
PHOSPHORUS: 4.9 mg/dL — AB (ref 2.5–4.6)
Potassium: 3.8 mmol/L (ref 3.5–5.1)
SODIUM: 131 mmol/L — AB (ref 135–145)

## 2017-11-29 LAB — PHOSPHORUS: Phosphorus: 5.3 mg/dL — ABNORMAL HIGH (ref 2.5–4.6)

## 2017-11-29 LAB — MAGNESIUM: Magnesium: 1.5 mg/dL — ABNORMAL LOW (ref 1.7–2.4)

## 2017-11-29 MED ORDER — CLONIDINE HCL 0.3 MG/24HR TD PTWK
0.3000 mg | MEDICATED_PATCH | TRANSDERMAL | Status: DC
  Administered 2017-12-01: 0.3 mg via TRANSDERMAL
  Filled 2017-11-29 (×2): qty 1

## 2017-11-29 NOTE — Care Management Note (Addendum)
Case Management Note  Patient Details  Name: Draycen Leichter MRN: [id number] Date of Birth: 1956/06/23  Subjective/Objective:    Pt admitted with AMS, seizures                Action/Plan:  PTA from home with wife - ESRD on out pt.  Pt is already active with [facility] for PT - pt is in agreement with resumption of services with [facility] with the addition of [facility] - agency aware and accept referral.  CM will continue to follow for discharge needs   Expected Discharge Date:  11/30/17               Expected Discharge Plan:  Home/Self Care  In-House Referral:     Discharge planning Services  CM Consult  Post Acute Care Choice:    Choice offered to:  Patient  DME Arranged:    DME Agency:     HH Arranged:  RN, PT [place] Agency:  [facility]  Status of Service:  In process, will continue to follow  If discussed at Long Length of Stay Meetings, dates discussed:    Additional Comments:  Maryclare Labrador, RN 11/29/2017, 2:34 PM

## 2017-11-29 NOTE — Progress Notes (Signed)
PD tx completed overnight without issue.  UF 1554 mL  Catheter clamped, capped, and secured to patient.  Will continue to monitor.

## 2017-11-29 NOTE — Progress Notes (Signed)
Inpatient Diabetes Program Recommendations  AACE/ADA: New Consensus Statement on Inpatient Glycemic Control (2015)  Target Ranges:  Prepandial:   less than 140 mg/dL      Peak postprandial:   less than 180 mg/dL (1-2 hours)      Critically ill patients:  140 - 180 mg/dL   Lab Results  Component Value Date   GLUCAP 236 (H) 11/29/2017   HGBA1C 8.9 (H) 11/26/2017    Review of Glycemic Control Results for Jared [name], Jared [name] (MRN [id number]) as of 11/29/2017 09:00  Ref. Range 11/28/2017 20:32 11/28/2017 23:05 11/29/2017 03:13 11/29/2017 04:08 11/29/2017 07:38  Glucose-Capillary Latest Ref Range: 65 - 99 mg/dL 69 212 (H)  338 (H) 236 (H)   Diabetes history: Type 2 DM Outpatient Diabetes medications: NPH 30 Units Q AM Current orders for Inpatient glycemic control: Lantus 10 Units QD, Novolog 0-9 Units Q4H  Inpatient Diabetes Program Recommendations:     Verified with primary nurse that the patient is eating poorly and not consistently. Maybe stacking Novolog considering renal status and on Q4H, thus contributing to hypoglycemic episodes. May consider either Novolog 0-9 Q6H, OR Novolog 0-9 Units TID AC and Novolog 0-5 HS.   Thanks, Bronson Curb, MSN, RNC-OB Diabetes Coordinator [phone] (8a-5p)

## 2017-11-29 NOTE — Progress Notes (Signed)
Inpatient Diabetes Program Recommendations  AACE/ADA: New Consensus Statement on Inpatient Glycemic Control (2015)  Target Ranges:  Prepandial:   less than 140 mg/dL      Peak postprandial:   less than 180 mg/dL (1-2 hours)      Critically ill patients:  140 - 180 mg/dL   Lab Results  Component Value Date   GLUCAP 185 (H) 11/29/2017   HGBA1C 8.9 (H) 11/26/2017    Review of Glycemic Control Results for Jared [name], Jared [name] (MRN [id number]) as of 11/29/2017 16:14  Ref. Range 11/28/2017 20:32 11/28/2017 23:05 11/29/2017 03:13 11/29/2017 04:08 11/29/2017 07:38 11/29/2017 10:47  Glucose-Capillary Latest Ref Range: 65 - 99 mg/dL 69 212 (H)  338 (H) 236 (H) 185 (H)   Diabetes history:Type 2 DM Outpatient Diabetes medications:NPH 30 Units Q AM Current orders for Inpatient glycemic control:Lantus 10 Units QD, Novolog 0-9 Units Q4H  Inpatient Diabetes Program Recommendations:    Spoke with patient's nurse regarding daily hypoglycemic events, occurring for last three days around 1800-2000. This is despite the decrease of Lantus to 10 Units QD. Discussed to expect low blood sugars, as the Novolog may be lasting longer (6-8 hours) given renal status, thus potentially contributing to lows. Nurse aware and no additional questions. Recommend reduction of frequency of Novolog to TID AC instead of Q4H.  Thanks, Bronson Curb, MSN, RNC-OB Diabetes Coordinator [phone] (8a-5p)

## 2017-11-29 NOTE — Evaluation (Signed)
Physical Therapy Evaluation Patient Details Name: Wanda Cellucci MRN: [id number] DOB: 05/30/1956 Today's Date: 11/29/2017   History of Present Illness  Pt is a 62 y.o. male admitted 11/24/17 having been found in confused and lethargic state from family with suspected seizure activity (not witnessed); worked up for likely metabolic encephalopathy with hypertension urgency and elevated blood sugar. EEG showed no signs up seizure spikes. Pertinent PMH includes at home peritoneal dialysis, DM, HTN, seizures.   Clinical Impression  Pt presents with an overall decrease in functional mobility secondary to above. PTA, pt indep and lives at home with sister; has family available for 24/7 support if needed. Today, able to amb 250' with no DME, on requiring supervision for balance due to initial instability. Encouraged pt to continue ambulating with nursing staff to assist with IV pole/lines. Pt would benefit from continued acute PT services to maximize functional mobility and independence prior to d/c home with HHPT services.     Follow Up Recommendations Home health PT    Equipment Recommendations  None recommended by PT    Recommendations for Other Services       Precautions / Restrictions Precautions Precautions: Fall Restrictions Weight Bearing Restrictions: No      Mobility  Bed Mobility Overal bed mobility: Independent                Transfers Overall transfer level: Needs assistance Equipment used: None Transfers: Sit to/from Stand Sit to Stand: Supervision            Ambulation/Gait Ambulation/Gait assistance: Supervision Ambulation Distance (Feet): 250 Feet Assistive device: 1 person hand held assist;None Gait Pattern/deviations: Step-through pattern;Decreased stride length Gait velocity: Decreased Gait velocity interpretation: <1.8 ft/sec, indicative of risk for recurrent falls General Gait Details: Amb with single UE support on IV pole due to initial  instability, progressing to amb with no UE support and supervision for safety  Stairs            Wheelchair Mobility    Modified Rankin (Stroke Patients Only)       Balance Overall balance assessment: Needs assistance   Sitting balance-Leahy Scale: Good       Standing balance-Leahy Scale: Fair                               Pertinent Vitals/Pain Pain Assessment: No/denies pain    Home Living Family/patient expects to be discharged to:: Private residence Living Arrangements: Other relatives(Sister) Available Help at Discharge: Family;Available 24 hours/day Type of Home: House Home Access: Stairs to enter Entrance Stairs-Rails: Right Entrance Stairs-Number of Steps: 3 Home Layout: Two level;Bed/bath upstairs Home Equipment: Cane - single point      Prior Function Level of Independence: Independent         Comments: Indep with household mobility and ADLs. Does peritoneal dialysis independently at home. Drives     Hand Dominance   Dominant Hand: Right    Extremity/Trunk Assessment   Upper Extremity Assessment Upper Extremity Assessment: Overall WFL for tasks assessed    Lower Extremity Assessment Lower Extremity Assessment: Overall WFL for tasks assessed       Communication   Communication: No difficulties  Cognition Arousal/Alertness: Awake/alert Behavior During Therapy: WFL for tasks assessed/performed Overall Cognitive Status: Within Functional Limits for tasks assessed  General Comments      Exercises     Assessment/Plan    PT Assessment Patient needs continued PT services  PT Problem List Decreased activity tolerance;Decreased balance;Decreased mobility       PT Treatment Interventions DME instruction;Gait training;Stair training;Functional mobility training;Therapeutic activities;Therapeutic exercise;Balance training;Patient/family education    PT Goals (Current  goals can be found in the Care Plan section)  Acute Rehab PT Goals Patient Stated Goal: Return home PT Goal Formulation: With patient Time For Goal Achievement: 12/13/17 Potential to Achieve Goals: Good    Frequency Min 3X/week   Barriers to discharge        Co-evaluation               AM-PAC PT "6 Clicks" Daily Activity  Outcome Measure Difficulty turning over in bed (including adjusting bedclothes, sheets and blankets)?: None Difficulty moving from lying on back to sitting on the side of the bed? : None Difficulty sitting down on and standing up from a chair with arms (e.g., wheelchair, bedside commode, etc,.)?: A Little Help needed moving to and from a bed to chair (including a wheelchair)?: A Little Help needed walking in hospital room?: A Little Help needed climbing 3-5 steps with a railing? : A Little 6 Click Score: 20    End of Session Equipment Utilized During Treatment: Gait belt Activity Tolerance: Patient tolerated treatment well Patient left: with call bell/phone within reach;with nursing/sitter in room Nurse Communication: Mobility status;Other (comment)(IV site dripping) PT Visit Diagnosis: Other abnormalities of gait and mobility (R26.89)    Time: 6004-5997 PT Time Calculation (min) (ACUTE ONLY): 21 min   Charges:   PT Evaluation $PT Eval Low Complexity: 1 Low     PT G Codes:       [name], PT, DPT Acute Rehab Services  Pager: [place] 11/29/2017, 12:24 PM

## 2017-11-29 NOTE — Progress Notes (Signed)
PROGRESS NOTE    Jared [name]  IHK:[id number] DOB: [birth year] DOA: 11/24/2017 PCP: Girtha Rm, NP-C   Brief Narrative:  Jared [name] a 62 y.o.malewith medical history ofperitoneal dialysis, DM, HTN, seizures presents with confusion. Patient's sister lives with him and had noted him complain of abdominal discomfort and with a poor appetite for about 4-5 days now. He has had some nausea. She states it is not uncommon for him to have GI issues like this. The day prior to admission, she noted at about 4 AM, he was quite confused. He did not know who she was. She states he skipped his dialysis yesterday evening which he does not usually do. Encephalopathy has improved and patient no longer dizzy after starting Meclizine. Continues to be on Nitro gtt.   Assessment & Plan:   Principal Problem:   Metabolic encephalopathy Active Problems:   CKD (chronic kidney disease) stage V requiring chronic dialysis (HCC)   Anemia of chronic disease   Accelerated hypertension   Type 2 diabetes mellitus with peripheral neuropathy (HCC)   Acute metabolic encephalopathy  Encephalopathy likley related to HTN Emergency -per chart review, he has similar presentation 3 times in the past with encephalopathy, very elevated blood pressure and very elevated blood sugar around 1000, he reports compliance with his meds and PD -Family denies seizure activity, family reports home meds keppra was recently discontinued.  -encephalopathy Likely metabolic, with hypertension urgency, elevated blood sugar (>1000), bun, acidosis. - UDS  negative, alcohol level less than 10, ct head no acute findings, ammonia level unremarkable, ua unremarkable, no fever, no leukocytosis, does not appear to have infection , pd fluids cell counts wbc 2, culture no growth -EEG consistent with metabolic encephalopathy, no seizure spikes -mri brain no acute findings, does show chronic sinusitis.  -continue keppra, Neurology input  appreciated  Hypertension Emergency, improving -BP 261/128 on arrival, he is started on nitrodrip, stepdown admission; Will need to [facility] -He is also on clonidine patch, scheduled iv hydralazine bp better, Nephrology following and increased Clonidine  ESRD On CPPD:  -Nephrology consulted, will follow recommendations.  Insulin dependent DM2,  -a1c 8.9, adjust insulin.  -He presented with hyperglycemia, elevated anion gap of 26 on admission, but also has ESRD.  anion gap has improved, down from 26 to 14,  -Continue monitor  bmp  -Had hypoglycemia episode while npo, decrease lantus, -continue ssi, on hypoglycemia protocol.  H/o ?seizure:  -per family denies seizure activity. family reports home meds keppra was recently discontinued.  - no seizure activity observed in the hospital so far,  -eeg no seizure spikes, seizure precaution,  -he is started on iv keppra, transitioned to orla keppra. -neurology input appreciated.  H/o prostate cancer,  -he reports getting shots q98months. -PSA severely Elevated  N/V likely from BPPV -family reports patient has intermittent n/v, poor appetite, wonder component of gastroparesis,  kub no acute findings.  - he also reports left ear ringing and feeling dizzy with head movement , reports symptom started a week ago. -Suspect  Recent n/v related to inner ear problem. Mri brain did confirm sinusitis.  -C/w flonase, claritin, prn meclizine, PT eval recommending Home Health, will need outpatient ENT follow up and possible vestibular rehab  DVT prophylaxis:  Code Status: FULL CODE Family Communication: No family present at bedside Disposition Plan: Remain Inpatient   Consultants:   Nephrology   Procedures: Peritoneal Dialysis   Antimicrobials:  Anti-infectives (From admission, onward)   None     Subjective: Seen  and examined at bedside and was oriented and awake. No problems. Felt weak but better today. No CP.    Objective: Vitals:   11/29/17 0400 11/29/17 0545 11/29/17 0734 11/29/17 0903  BP: (!) 160/86 (!) 149/95 (!) 160/86 (!) 154/94  Pulse: (!) 109  (!) 110   Resp: (!) 23  (!) 24   Temp: 98.3 F (36.8 C)  98.9 F (37.2 C)   TempSrc: Oral  Oral   SpO2: 94%  91%   Weight: 64.4 kg (141 lb 15.6 oz)     Height:        Intake/Output Summary (Last 24 hours) at 11/29/2017 0904 Last data filed at 11/29/2017 0546 Gross per 24 hour  Intake 651 ml  Output 250 ml  Net 401 ml   Filed Weights   11/27/17 1905 11/28/17 0810 11/29/17 0400  Weight: 64.5 kg (142 lb 3.2 oz) 64 kg (141 lb 1.5 oz) 64.4 kg (141 lb 15.6 oz)   Examination: Physical Exam:  Constitutional: WN/WD AAM in NAD and appears calm and comfortable Eyes: Lids and conjunctivae normal, sclerae anicteric  ENMT: External Ears, Nose appear normal. Grossly normal hearing. Mucous membranes are moist  Neck: Appears normal, supple, no cervical masses, normal ROM, no appreciable thyromegaly; no JVD Respiratory: Diminishedd to auscultation bilaterally, no wheezing, rales, rhonchi or crackles. Normal respiratory effort and patient is not tachypenic. No accessory muscle use.  Cardiovascular: RRR, no murmurs / rubs / gallops. S1 and S2 auscultated. No extremity edema. 2+ pedal pulses. No carotid bruits.  Abdomen: Soft, non-tender, non-distended. No masses palpated. No appreciable hepatosplenomegaly. Bowel sounds positive. Peritoneal Catheter in place.  GU: Deferred. Musculoskeletal: No clubbing / cyanosis of digits/nails. No joint deformity upper and lower extremities.  Skin: No rashes, lesions, ulcers on a limited skin eval. No induration; Warm and dry.  Neurologic: CN 2-12 grossly intact with no focal deficits. Sensation intact in all 4 Extremities. Romberg sign and cerebellar reflexes not assessed.  Psychiatric: Normal judgment and insight. Alert and oriented x 3. Normal mood and appropriate affect.   Data Reviewed: I have personally reviewed  following labs and imaging studies  CBC: Recent Labs  Lab 11/24/17 0700  WBC 8.0  NEUTROABS 6.5  HGB 14.4  HCT 43.3  MCV 84.7  PLT 161   Basic Metabolic Panel: Recent Labs  Lab 11/25/17 1555 11/26/17 1135 11/27/17 0237 11/28/17 0322 11/29/17 0313  NA 140 135 135 134* 131*  K 5.0 3.8 3.9 3.9 3.8  CL 99* 96* 94* 96* 89*  CO2 27 27 26 27 30   GLUCOSE 73 177* 200* 187* 322*  BUN 86* 67* 61* 55* 49*  CREATININE 14.61* 13.27* 12.65* 12.03* 12.17*  CALCIUM 9.2 8.9 8.4* 8.4* 8.2*  PHOS  --   --   --  4.8* 4.9*   GFR: Estimated Creatinine Clearance: 5.6 mL/min (A) (by C-G formula based on SCr of 12.17 mg/dL (H)). Liver Function Tests: Recent Labs  Lab 11/24/17 0954 11/25/17 0836 11/28/17 0322 11/29/17 0313  AST 30 27  --   --   ALT 17 14*  --   --   ALKPHOS 59 49  --   --   BILITOT 1.5* 0.7  --   --   PROT 6.2* 4.9*  --   --   ALBUMIN 3.2* 2.4* 2.1* 2.2*   No results for input(s): LIPASE, AMYLASE in the last 168 hours. Recent Labs  Lab 11/24/17 0954  AMMONIA 23   Coagulation Profile: No results for input(s):  INR, PROTIME in the last 168 hours. Cardiac Enzymes: Recent Labs  Lab 11/24/17 0954  TROPONINI 0.04*   BNP (last 3 results) No results for input(s): PROBNP in the last 8760 hours. HbA1C: No results for input(s): HGBA1C in the last 72 hours. CBG: Recent Labs  Lab 11/28/17 1634 11/28/17 2032 11/28/17 2305 11/29/17 0408 11/29/17 0738  GLUCAP 140* 69 212* 338* 236*   Lipid Profile: No results for input(s): CHOL, HDL, LDLCALC, TRIG, CHOLHDL, LDLDIRECT in the last 72 hours. Thyroid Function Tests: Recent Labs    11/27/17 1728  TSH 1.403   Anemia Panel: Recent Labs    11/27/17 1728  VITAMINB12 1,412*  FOLATE 8.3   Sepsis Labs: No results for input(s): PROCALCITON, LATICACIDVEN in the last 168 hours.  Recent Results (from the past 240 hour(s))  MRSA PCR Screening     Status: None   Collection Time: 11/24/17  7:16 PM  Result Value Ref  Range Status   MRSA by PCR NEGATIVE NEGATIVE Final    Comment:        The GeneXpert MRSA Assay (FDA approved for NASAL specimens only), is one component of a comprehensive MRSA colonization surveillance program. It is not intended to diagnose MRSA infection nor to guide or monitor treatment for MRSA infections.   Culture, body fluid-bottle     Status: None (Preliminary result)   Collection Time: 11/25/17  8:30 AM  Result Value Ref Range Status   Specimen Description PERITONEAL  Final   Special Requests NONE  Final   Culture NO GROWTH 4 DAYS  Final   Report Status PENDING  Incomplete  Gram stain     Status: None   Collection Time: 11/25/17  8:30 AM  Result Value Ref Range Status   Specimen Description PERITONEAL  Final   Special Requests NONE  Final   Gram Stain   Final    WBC PRESENT, PREDOMINANTLY MONONUCLEAR NO ORGANISMS SEEN CYTOSPIN SMEAR    Report Status 11/25/2017 FINAL  Final    Radiology Studies: Mr Brain Wo Contrast  Result Date: 11/27/2017 CLINICAL DATA:  Altered level of consciousness.  Possible seizure. EXAM: MRI HEAD WITHOUT CONTRAST TECHNIQUE: Multiplanar, multiecho pulse sequences of the brain and surrounding structures were obtained without intravenous contrast. COMPARISON:  CT head without contrast 11/24/2017. MRI brain 03/17/2017 FINDINGS: Brain: The study is mildly degraded by patient motion. Diffusion-weighted images demonstrate no acute or subacute infarction. Mild generalized atrophy is stable. No significant white matter disease is present. The ventricles are proportionate to the degree of atrophy. The internal auditory canals are within normal limits. The brainstem and cerebellum are normal. Vascular: Flow is present in the major intracranial arteries. Skull and upper cervical spine: The skullbase is within normal limits. The craniocervical junction is normal. Marrow signal is normal. Sinuses/Orbits: Circumferential mucosal thickening is present in the  maxillary sinuses bilaterally, right greater than left. The remaining paranasal sinuses and the mastoid air cells are clear. The globes and orbits are within normal limits. IMPRESSION: 1. No acute intracranial abnormality or significant interval change. 2. The study is mildly degraded by patient motion. 3. Age advanced atrophy. 4. 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 Electronically Signed   By: San Morelle M.D.   On: 11/27/2017 16:47   Dg Abd Portable 1v  Result Date: 11/27/2017 CLINICAL DATA:  Nausea vomiting EXAM: PORTABLE ABDOMEN - 1 VIEW COMPARISON:  CT abdomen pelvis March 15, 2017 FINDINGS: The bowel gas pattern is normal. A pigtail drainage tube is  projected over the left abdomen. No radio-opaque calculi or other significant radiographic abnormality are seen. IMPRESSION: No bowel obstruction.  Drainage tube projected over left abdomen. Electronically Signed   By: Abelardo Diesel M.D.   On: 11/27/2017 16:04   Scheduled Meds: . amLODipine  10 mg Oral Daily  . chlorhexidine  15 mL Mouth Rinse BID  . [START ON 12/01/2017] cloNIDine  0.2 mg Transdermal Weekly  . fluticasone  1 spray Each Nare Daily  . gentamicin cream  1 application Topical Daily  . heparin  5,000 Units Subcutaneous Q8H  . hydrALAZINE  100 mg Oral Q8H  . insulin aspart  0-9 Units Subcutaneous Q4H  . insulin glargine  10 Units Subcutaneous Daily  . levETIRAcetam  250 mg Oral Daily  . loratadine  10 mg Oral Daily  . pantoprazole  40 mg Oral Daily  . sodium chloride flush  3 mL Intravenous Q12H   Continuous Infusions: . sodium chloride    . dialysis solution 2.5% low-MG/low-CA    . nitroGLYCERIN 120 mcg/min (11/29/17 0800)    LOS: 5 days   Kerney Elbe, DO [facility] Pager [phone]  If 7PM-7AM, please contact night-coverage [email or web address] Password [email or web address] 11/29/2017, 9:04 AM

## 2017-11-29 NOTE — Progress Notes (Signed)
Assessment/ Plan:   1. AMS- likely related to hypertensive urgency as well as metabolic encephalopathy, improved                 I worry about non-adherence to PD or PF failure 2. ESRD- Will follow his progress whether to proceed with CCPD or transition to HD. Can decide as OP. 3. Hypertensive urgency- markedly improved with meds, increase clonidine 4. Vascular access- has LUE AVF +T/B 5. Seizure disorder- on Keppra 6. Disposition- Cont home PD.     Subjective: Interval History: -1554cc with PD  Objective: Vital signs in last 24 hours: Temp:  [98.2 F (36.8 C)-98.9 F (37.2 C)] 98.9 F (37.2 C) (01/03 0734) Pulse Rate:  [87-115] 115 (01/03 1000) Resp:  [18-24] 20 (01/03 1000) BP: (132-183)/(67-95) 183/95 (01/03 1000) SpO2:  [91 %-97 %] 96 % (01/03 1000) Weight:  [64.4 kg (141 lb 15.6 oz)] 64.4 kg (141 lb 15.6 oz) (01/03 0400) Weight change: -0.5 kg (-1.6 oz)  Intake/Output from previous day: 01/02 0701 - 01/03 0700 In: 891 [P.O.:600; I.V.:291] Out: 15242 [Urine:50; Emesis/NG output:200] Intake/Output this shift: Total I/O In: 22297 [Other:14994] Out: 98921 [Other:16552]  General appearance: alert and cooperative Resp: clear to auscultation bilaterally Cardio: regular rate and rhythm, S1, S2 normal, no murmur, click, rub or gallop Extremities: extremities normal, atraumatic, no cyanosis or edema and AVF LUE  Lab Results: No results for input(s): WBC, HGB, HCT, PLT in the last 72 hours. BMET:  Recent Labs    11/28/17 0322 11/29/17 0313  NA 134* 131*  K 3.9 3.8  CL 96* 89*  CO2 27 30  GLUCOSE 187* 322*  BUN 55* 49*  CREATININE 12.03* 12.17*  CALCIUM 8.4* 8.2*   No results for input(s): PTH in the last 72 hours. Iron Studies: No results for input(s): IRON, TIBC, TRANSFERRIN, FERRITIN in the last 72 hours. Studies/Results: Mr Brain Wo Contrast  Result Date: 11/27/2017 CLINICAL DATA:  Altered level of consciousness.  Possible seizure. EXAM: MRI HEAD WITHOUT  CONTRAST TECHNIQUE: Multiplanar, multiecho pulse sequences of the brain and surrounding structures were obtained without intravenous contrast. COMPARISON:  CT head without contrast 11/24/2017. MRI brain 03/17/2017 FINDINGS: Brain: The study is mildly degraded by patient motion. Diffusion-weighted images demonstrate no acute or subacute infarction. Mild generalized atrophy is stable. No significant white matter disease is present. The ventricles are proportionate to the degree of atrophy. The internal auditory canals are within normal limits. The brainstem and cerebellum are normal. Vascular: Flow is present in the major intracranial arteries. Skull and upper cervical spine: The skullbase is within normal limits. The craniocervical junction is normal. Marrow signal is normal. Sinuses/Orbits: Circumferential mucosal thickening is present in the maxillary sinuses bilaterally, right greater than left. The remaining paranasal sinuses and the mastoid air cells are clear. The globes and orbits are within normal limits. IMPRESSION: 1. No acute intracranial abnormality or significant interval change. 2. The study is mildly degraded by patient motion. 3. Age advanced atrophy. 4. 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 Electronically Signed   By: San Morelle M.D.   On: 11/27/2017 16:47   Dg Abd Portable 1v  Result Date: 11/27/2017 CLINICAL DATA:  Nausea vomiting EXAM: PORTABLE ABDOMEN - 1 VIEW COMPARISON:  CT abdomen pelvis March 15, 2017 FINDINGS: The bowel gas pattern is normal. A pigtail drainage tube is projected over the left abdomen. No radio-opaque calculi or other significant radiographic abnormality are seen. IMPRESSION: No bowel obstruction.  Drainage tube projected over left  abdomen. Electronically Signed   By: Abelardo Diesel M.D.   On: 11/27/2017 16:04    Scheduled: . amLODipine  10 mg Oral Daily  . chlorhexidine  15 mL Mouth Rinse BID  . [START ON 12/01/2017] cloNIDine  0.2 mg  Transdermal Weekly  . fluticasone  1 spray Each Nare Daily  . gentamicin cream  1 application Topical Daily  . heparin  5,000 Units Subcutaneous Q8H  . hydrALAZINE  100 mg Oral Q8H  . insulin aspart  0-9 Units Subcutaneous Q4H  . insulin glargine  10 Units Subcutaneous Daily  . levETIRAcetam  250 mg Oral Daily  . loratadine  10 mg Oral Daily  . pantoprazole  40 mg Oral Daily  . sodium chloride flush  3 mL Intravenous Q12H      LOS: 5 days   Estanislado Emms 11/29/2017,12:33 PM

## 2017-11-30 DIAGNOSIS — K769 Liver disease, unspecified: Secondary | ICD-10-CM | POA: Diagnosis not present

## 2017-11-30 DIAGNOSIS — R82998 Other abnormal findings in urine: Secondary | ICD-10-CM | POA: Diagnosis not present

## 2017-11-30 DIAGNOSIS — D509 Iron deficiency anemia, unspecified: Secondary | ICD-10-CM | POA: Diagnosis not present

## 2017-11-30 DIAGNOSIS — N186 End stage renal disease: Secondary | ICD-10-CM | POA: Diagnosis not present

## 2017-11-30 DIAGNOSIS — N2581 Secondary hyperparathyroidism of renal origin: Secondary | ICD-10-CM | POA: Diagnosis not present

## 2017-11-30 DIAGNOSIS — N2589 Other disorders resulting from impaired renal tubular function: Secondary | ICD-10-CM | POA: Diagnosis not present

## 2017-11-30 LAB — CBC WITH DIFFERENTIAL/PLATELET
BASOS ABS: 0 10*3/uL (ref 0.0–0.1)
BASOS PCT: 0 %
Eosinophils Absolute: 0.3 10*3/uL (ref 0.0–0.7)
Eosinophils Relative: 6 %
HCT: 32.3 % — ABNORMAL LOW (ref 39.0–52.0)
HEMOGLOBIN: 10.4 g/dL — AB (ref 13.0–17.0)
Lymphocytes Relative: 30 %
Lymphs Abs: 1.7 10*3/uL (ref 0.7–4.0)
MCH: 26.9 pg (ref 26.0–34.0)
MCHC: 32.2 g/dL (ref 30.0–36.0)
MCV: 83.5 fL (ref 78.0–100.0)
Monocytes Absolute: 0.7 10*3/uL (ref 0.1–1.0)
Monocytes Relative: 12 %
NEUTROS PCT: 52 %
Neutro Abs: 2.9 10*3/uL (ref 1.7–7.7)
Platelets: 195 10*3/uL (ref 150–400)
RBC: 3.87 MIL/uL — ABNORMAL LOW (ref 4.22–5.81)
RDW: 14.1 % (ref 11.5–15.5)
WBC: 5.6 10*3/uL (ref 4.0–10.5)

## 2017-11-30 LAB — CULTURE, BODY FLUID W GRAM STAIN -BOTTLE

## 2017-11-30 LAB — COMPREHENSIVE METABOLIC PANEL
ALBUMIN: 2.1 g/dL — AB (ref 3.5–5.0)
ALK PHOS: 57 U/L (ref 38–126)
ALT: 12 U/L — ABNORMAL LOW (ref 17–63)
AST: 19 U/L (ref 15–41)
Anion gap: 13 (ref 5–15)
BILIRUBIN TOTAL: 0.8 mg/dL (ref 0.3–1.2)
BUN: 48 mg/dL — AB (ref 6–20)
CO2: 28 mmol/L (ref 22–32)
Calcium: 8.2 mg/dL — ABNORMAL LOW (ref 8.9–10.3)
Chloride: 90 mmol/L — ABNORMAL LOW (ref 101–111)
Creatinine, Ser: 12.29 mg/dL — ABNORMAL HIGH (ref 0.61–1.24)
GFR calc Af Amer: 4 mL/min — ABNORMAL LOW (ref >60)
GFR calc non Af Amer: 4 mL/min — ABNORMAL LOW (ref >60)
GLUCOSE: 124 mg/dL — AB (ref 65–99)
POTASSIUM: 3.5 mmol/L (ref 3.5–5.1)
Sodium: 131 mmol/L — ABNORMAL LOW (ref 135–145)
TOTAL PROTEIN: 5.2 g/dL — AB (ref 6.5–8.1)

## 2017-11-30 LAB — GLUCOSE, CAPILLARY
GLUCOSE-CAPILLARY: 129 mg/dL — AB (ref 65–99)
GLUCOSE-CAPILLARY: 142 mg/dL — AB (ref 65–99)
Glucose-Capillary: 129 mg/dL — ABNORMAL HIGH (ref 65–99)
Glucose-Capillary: 225 mg/dL — ABNORMAL HIGH (ref 65–99)
Glucose-Capillary: 310 mg/dL — ABNORMAL HIGH (ref 65–99)

## 2017-11-30 LAB — PHOSPHORUS: Phosphorus: 4.9 mg/dL — ABNORMAL HIGH (ref 2.5–4.6)

## 2017-11-30 LAB — MAGNESIUM: Magnesium: 1.4 mg/dL — ABNORMAL LOW (ref 1.7–2.4)

## 2017-11-30 LAB — CULTURE, BODY FLUID-BOTTLE: CULTURE: NO GROWTH

## 2017-11-30 MED ORDER — MAGNESIUM SULFATE 2 GM/50ML IV SOLN
2.0000 g | Freq: Once | INTRAVENOUS | Status: AC
  Administered 2017-11-30: 2 g via INTRAVENOUS
  Filled 2017-11-30: qty 50

## 2017-11-30 MED ORDER — INSULIN ASPART 100 UNIT/ML ~~LOC~~ SOLN
0.0000 [IU] | Freq: Three times a day (TID) | SUBCUTANEOUS | Status: DC
  Administered 2017-11-30: 1 [IU] via SUBCUTANEOUS
  Administered 2017-11-30: 7 [IU] via SUBCUTANEOUS
  Administered 2017-11-30: 3 [IU] via SUBCUTANEOUS

## 2017-11-30 NOTE — Progress Notes (Addendum)
35 minutes delay while PD machine finished last drain.  Tx completed overnight with no issue.  UF 2215 mL  Catheter pinned, capped, clamped, and secured to patient.  Will continue to monitor.

## 2017-11-30 NOTE — Progress Notes (Signed)
PROGRESS NOTE    Jared [name]  WCH:[id number] DOB: 1956-10-14 DOA: 11/24/2017 PCP: Girtha Rm, NP-C   Brief Narrative:  Jared [name] a 62 y.o.malewith medical history ofperitoneal dialysis, DM, HTN, seizures presents with confusion. Patient's sister lives with him and had noted him complain of abdominal discomfort and with a poor appetite for about 4-5 days now. He has had some nausea. She states it is not uncommon for him to have GI issues like this. The day prior to admission, she noted at about 4 AM, he was quite confused. He did not know who she was. She states he skipped his dialysis yesterday evening which he does not usually do. Encephalopathy has improved and patient no longer dizzy after starting Meclizine. Continued to be on Nitro gtt and Discontinued today.   Assessment & Plan:   Principal Problem:   Metabolic encephalopathy Active Problems:   CKD (chronic kidney disease) stage V requiring chronic dialysis (HCC)   Anemia of chronic disease   Accelerated hypertension   Type 2 diabetes mellitus with peripheral neuropathy (HCC)   Acute metabolic encephalopathy  Encephalopathy likley related to HTN Emergency -Per chart review, he has similar presentation 3 times in the past with encephalopathy, very elevated blood pressure and very elevated blood sugar around 1000, he reports compliance with his meds and PD -Family denied seizure activity, family reports home meds keppra was recently discontinued.  -Encephalopathy Likely metabolic, with hypertension urgency, elevated blood sugar (>1000), bun, acidosis. - UDS negative, alcohol level less than 10, ct head no acute findings, ammonia level unremarkable, ua unremarkable, no fever, no leukocytosis, does not appear to have infection , pd fluids cell counts wbc 2, culture no growth -EEG consistent with metabolic encephalopathy, no seizure spikes -MRI brain no acute findings, does show chronic sinusitis.  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 -Patient's BUN/Cr went from 49/12.17 -> 48/12.29 -> 48/12.29 -Per Nephrology will follow his progress with CCPD or transition to HD and can be decided as an outpatient  Insulin dependent DM2  -HbA1c 8.9,  -Continue to adjust Insulin as necessary .  -He presented with hyperglycemia, elevated anion gap of 26 on admission, but also has ESRD.  anion gap has improved, down from 26 to 14,  -Continue monitor BMP  -Had hypoglycemia episode while npo, decrease lantus, -Changed Sensitive Novolog SSI q4h to Sensitive Novolog SSI AC per Diabetic Education Coordinator Recc's and is on hypoglycemia protocol.  H/o ? Seizure:  -Per family denied seizure activity. family reports home meds keppra was recently discontinued.  -No seizure activity observed in the hospital so far,  -EEG no seizure spikes, seizure precaution,  -Patient was started on iv keppra, transitioned to [place]. -neurology input appreciated.  H/o Prostate Cancer,  -He reports getting shots q17months. -PSA severely Elevated 155.00 -Will need outpatient Urology Follow Up  N/V likely from BPPV, improving  -family reports patient has intermittent n/v, poor appetite, wonder component of gastroparesis,  kub no acute findings.  - he also reports left ear ringing and feeling dizzy with head movement , reports symptom started a week ago. -Suspect  Recent n/v related to inner ear  problem. Mri brain did confirm sinusitis.  -C/w flonase, claritin, prn meclizine, PT eval recommending Home Health at D/C  -will need outpatient ENT follow up and  possible vestibular rehab  Hypomagnesemia  -Patient's Mag was 1.4 -Replete with IV Mag Sulfate 2 grams -Continue to Monitor and Replete as Necessary -Repeat Mag Level in AM   DVT prophylaxis: Heparin 5,000 units sq q8h Code Status: FULL CODE Family Communication: No family present at bedside Disposition Plan: Remain Inpatient   Consultants:   Nephrology   Procedures: Peritoneal Dialysis   Antimicrobials:  Anti-infectives (From admission, onward)   None     Subjective: Seen and examined at bedside and states he did not sleep very much last night. No CP or SOB. No nausea or vomiting   Objective: Vitals:   11/29/17 2322 11/30/17 0347 11/30/17 0352 11/30/17 0519  BP: 129/79 126/81  (!) 137/91  Pulse: (!) 109 (!) 105    Resp: (!) 21 16    Temp: 99 F (37.2 C) 98.3 F (36.8 C)    TempSrc: Oral Oral    SpO2: 96% 95%    Weight:   65.7 kg (144 lb 13.5 oz)   Height:        Intake/Output Summary (Last 24 hours) at 11/30/2017 0738 Last data filed at 11/30/2017 0519 Gross per 24 hour  Intake 16109 ml  Output 16652 ml  Net -543 ml   Filed Weights   11/28/17 0810 11/29/17 0400 11/30/17 0352  Weight: 64 kg (141 lb 1.5 oz) 64.4 kg (141 lb 15.6 oz) 65.7 kg (144 lb 13.5 oz)   Examination: Physical Exam:  Constitutional: WN/WD AAM in NAD appears calm and wanting to sleep Eyes: Sclerae anicteric. Lids normal ENMT: External Ears and nose appear normal. MMM Neck: Supple with no JVD Respiratory: Diminished to auscultation. No appreciable wheezing/rales/rhonchi. No JVD Cardiovascular: Tachycardic Rate; No m/r/g. No appreciable edema Abdomen: Soft, NT, ND. Bowel sounds present. Has PD Catheter present GU: Deferred Musculoskeletal: No contractures; No cyanosis Has fistula in Left Arm Skin: Warm and Dry, no rashes or lesions on a limited skin eval Neurologic: CN 2-12 grossly with no appreciable focal deficits.  Psychiatric: Normal mood and affect. Intact judgement and  insight  Data Reviewed: I have personally reviewed following labs and imaging studies  CBC: Recent Labs  Lab 11/24/17 0700 11/29/17 1141 11/30/17 0241  WBC 8.0 6.6 5.6  NEUTROABS 6.5 4.0 2.9  HGB 14.4 11.3* 10.4*  HCT 43.3 33.7* 32.3*  MCV 84.7 84.7 83.5  PLT 161 210 202   Basic Metabolic Panel: Recent Labs  Lab 11/26/17 1135 11/27/17 0237 11/28/17 0322 11/29/17 0313 11/29/17 1141 11/30/17 0241  NA 135 135 134* 131*  --  131*  K 3.8 3.9 3.9 3.8  --  3.5  CL 96* 94* 96* 89*  --  90*  CO2 27 26 27 30   --  28  GLUCOSE 177* 200* 187* 322*  --  124*  BUN 67* 61* 55* 49*  --  48*  CREATININE 13.27* 12.65* 12.03* 12.17*  --  12.29*  CALCIUM 8.9 8.4* 8.4* 8.2*  --  8.2*  MG  --   --   --   --  1.5* 1.4*  PHOS  --   --  4.8* 4.9* 5.3* 4.9*   GFR: Estimated Creatinine Clearance: 5.6 mL/min (A) (by C-G formula based on SCr of 12.29 mg/dL (H)). Liver Function Tests: Recent Labs  Lab 11/24/17 5427 11/25/17 0623 11/28/17 0322 11/29/17 7628 11/30/17 0241  AST 30 27  --   --  19  ALT 17 14*  --   --  12*  ALKPHOS 59 49  --   --  57  BILITOT 1.5* 0.7  --   --  0.8  PROT 6.2* 4.9*  --   --  5.2*  ALBUMIN 3.2* 2.4* 2.1* 2.2* 2.1*   No results for input(s): LIPASE, AMYLASE in the last 168 hours. Recent Labs  Lab 11/24/17 0954  AMMONIA 23   Coagulation Profile: No results for input(s): INR, PROTIME in the last 168 hours. Cardiac Enzymes: Recent Labs  Lab 11/24/17 0954  TROPONINI 0.04*   BNP (last 3 results) No results for input(s): PROBNP in the last 8760 hours. HbA1C: No results for input(s): HGBA1C in the last 72 hours. CBG: Recent Labs  Lab 11/29/17 1047 11/29/17 1618 11/29/17 2010 11/29/17 2326 11/30/17 0351  GLUCAP 185* 89 181* 252* 129*   Lipid Profile: No results for input(s): CHOL, HDL, LDLCALC, TRIG, CHOLHDL, LDLDIRECT in the last 72 hours. Thyroid Function Tests: Recent Labs    11/27/17 1728  TSH 1.403   Anemia Panel: Recent Labs     11/27/17 1728  VITAMINB12 1,412*  FOLATE 8.3   Sepsis Labs: No results for input(s): PROCALCITON, LATICACIDVEN in the last 168 hours.  Recent Results (from the past 240 hour(s))  MRSA PCR Screening     Status: None   Collection Time: 11/24/17  7:16 PM  Result Value Ref Range Status   MRSA by PCR NEGATIVE NEGATIVE Final    Comment:        The GeneXpert MRSA Assay (FDA approved for NASAL specimens only), is one component of a comprehensive MRSA colonization surveillance program. It is not intended to diagnose MRSA infection nor to guide or monitor treatment for MRSA infections.   Culture, body fluid-bottle     Status: None (Preliminary result)   Collection Time: 11/25/17  8:30 AM  Result Value Ref Range Status   Specimen Description PERITONEAL  Final   Special Requests NONE  Final   Culture NO GROWTH 4 DAYS  Final   Report Status PENDING  Incomplete  Gram stain     Status: None   Collection Time: 11/25/17  8:30 AM  Result Value Ref Range Status   Specimen Description PERITONEAL  Final   Special Requests NONE  Final   Gram Stain   Final    WBC PRESENT, PREDOMINANTLY MONONUCLEAR NO ORGANISMS SEEN CYTOSPIN SMEAR    Report Status 11/25/2017 FINAL  Final    Radiology Studies: No results found. Scheduled Meds: . amLODipine  10 mg Oral Daily  . chlorhexidine  15 mL Mouth Rinse BID  . [START ON 12/01/2017] cloNIDine  0.3 mg Transdermal Weekly  . fluticasone  1 spray Each Nare Daily  . gentamicin cream  1 application Topical Daily  . heparin  5,000 Units Subcutaneous Q8H  . hydrALAZINE  100 mg Oral Q8H  . insulin aspart  0-9 Units Subcutaneous TID WC  . insulin glargine  10 Units Subcutaneous Daily  . levETIRAcetam  250 mg Oral Daily  . loratadine  10 mg Oral Daily  . pantoprazole  40 mg Oral Daily  . sodium chloride flush  3 mL Intravenous Q12H   Continuous Infusions: . sodium chloride    . dialysis solution 2.5% low-MG/low-CA    . magnesium sulfate 1 - 4 g bolus  IVPB    . nitroGLYCERIN 103.333 mcg/min (11/30/17 0348)    LOS: 6 days  Kerney Elbe, DO [facility] Pager [phone]  If 7PM-7AM, please contact night-coverage [email or web address] Password [facility] 11/30/2017, 7:38 AM

## 2017-11-30 NOTE — Progress Notes (Signed)
Assessment/ Plan:  1. AMS- likely related to hypertensive urgency as well as metabolic encephalopathy, improvedalthough I worry about non-adherence to PD or PF failure 2. ESRD- Will follow his progress whether to proceed with CCPD or transition to HD. Can decide as OP. 3. Hypertensive urgency- markedly improved with meds,  4. Vascular access- has LUE AVF +T/B 5. Seizure disorder- on Keppra 6. Disposition- Cont home PD.    Subjective: Interval History: IV NTG stopped  Objective: Vital signs in last 24 hours: Temp:  [98.3 F (36.8 C)-99 F (37.2 C)] 98.9 F (37.2 C) (01/04 0745) Pulse Rate:  [102-109] 104 (01/04 0745) Resp:  [16-23] 20 (01/04 0745) BP: (110-158)/(64-94) 122/74 (01/04 0745) SpO2:  [95 %-98 %] 96 % (01/04 0745) Weight:  [65.7 kg (144 lb 13.5 oz)] 65.7 kg (144 lb 13.5 oz) (01/04 0352) Weight change: 1.7 kg (3 lb 12 oz)  Intake/Output from previous day: 01/03 0701 - 01/04 0700 In: 41962 [P.O.:360; I.V.:755] Out: 22979 [Urine:100] Intake/Output this shift: No intake/output data recorded.  General appearance: alert and cooperative Resp: clear to auscultation bilaterally Chest wall: no tenderness Cardio: regular rate and rhythm, S1, S2 normal, no murmur, click, rub or gallop Extremities: extremities normal, atraumatic, no cyanosis or edema  Lab Results: Recent Labs    11/29/17 1141 11/30/17 0241  WBC 6.6 5.6  HGB 11.3* 10.4*  HCT 33.7* 32.3*  PLT 210 195   BMET:  Recent Labs    11/29/17 0313 11/30/17 0241  NA 131* 131*  K 3.8 3.5  CL 89* 90*  CO2 30 28  GLUCOSE 322* 124*  BUN 49* 48*  CREATININE 12.17* 12.29*  CALCIUM 8.2* 8.2*   No results for input(s): PTH in the last 72 hours. Iron Studies: No results for input(s): IRON, TIBC, TRANSFERRIN, FERRITIN in the last 72 hours. Studies/Results: No results found.  Scheduled: . amLODipine  10 mg Oral Daily  . chlorhexidine  15 mL Mouth Rinse BID  . [START ON 12/01/2017] cloNIDine  0.3 mg  Transdermal Weekly  . fluticasone  1 spray Each Nare Daily  . gentamicin cream  1 application Topical Daily  . heparin  5,000 Units Subcutaneous Q8H  . hydrALAZINE  100 mg Oral Q8H  . insulin aspart  0-9 Units Subcutaneous TID WC  . insulin glargine  10 Units Subcutaneous Daily  . levETIRAcetam  250 mg Oral Daily  . loratadine  10 mg Oral Daily  . pantoprazole  40 mg Oral Daily  . sodium chloride flush  3 mL Intravenous Q12H   Continuous: . sodium chloride    . dialysis solution 2.5% low-MG/low-CA    . magnesium sulfate 1 - 4 g bolus IVPB       LOS: 6 days   Estanislado Emms 11/30/2017,10:01 AM

## 2017-11-30 NOTE — Progress Notes (Signed)
Physical Therapy Treatment Patient Details Name: Jared [name] MRN: [id number] DOB: 12/17/1955 Today's Date: 11/30/2017    History of Present Illness Pt is a 62 y.o. male admitted 11/24/17 having been found in confused and lethargic state from family with suspected seizure activity (not witnessed); worked up for likely metabolic encephalopathy with hypertension urgency and elevated blood sugar. EEG showed no signs up seizure spikes. Pertinent PMH includes at home peritoneal dialysis, DM, HTN, seizures.   PT Comments    Pt has progressed well with mobility. Independent with transfers and amb; able to ascend/descend stairs mod indep with rail support. Has met short-term acute PT goals. All education completed and questions answered. D/c acute PT.    Follow Up Recommendations  Home health PT     Equipment Recommendations  None recommended by PT    Recommendations for Other Services       Precautions / Restrictions Precautions Precautions: None Restrictions Weight Bearing Restrictions: No    Mobility  Bed Mobility Overal bed mobility: Independent                Transfers Overall transfer level: Independent Equipment used: None Transfers: Sit to/from Stand              Ambulation/Gait Ambulation/Gait assistance: Independent Ambulation Distance (Feet): 400 Feet Assistive device: None Gait Pattern/deviations: Step-through pattern;Decreased stride length Gait velocity: Decreased   General Gait Details: Slow, controlled amb with no overt instability   Stairs Stairs: Yes   Stair Management: One rail Right;Alternating pattern;Forwards Number of Stairs: 10 General stair comments: Ascend/descended 10 steps mod indep with rail  Wheelchair Mobility    Modified Rankin (Stroke Patients Only)       Balance Overall balance assessment: Needs assistance   Sitting balance-Leahy Scale: Good       Standing balance-Leahy Scale: Good               High  level balance activites: Side stepping;Backward walking;Direction changes;Turns;Sudden stops              Cognition Arousal/Alertness: Awake/alert Behavior During Therapy: WFL for tasks assessed/performed Overall Cognitive Status: Within Functional Limits for tasks assessed                                        Exercises      General Comments        Pertinent Vitals/Pain Pain Assessment: No/denies pain    Home Living                      Prior Function            PT Goals (current goals can now be found in the care plan section) Acute Rehab PT Goals Patient Stated Goal: Return home PT Goal Formulation: With patient Time For Goal Achievement: 12/13/17 Potential to Achieve Goals: Good Progress towards PT goals: Goals met/education completed, patient discharged from PT    Frequency    Min 3X/week      PT Plan Current plan remains appropriate    Co-evaluation              AM-PAC PT "6 Clicks" Daily Activity  Outcome Measure  Difficulty turning over in bed (including adjusting bedclothes, sheets and blankets)?: None Difficulty moving from lying on back to sitting on the side of the bed? : None Difficulty sitting down on and standing up from a  chair with arms (e.g., wheelchair, bedside commode, etc,.)?: None Help needed moving to and from a bed to chair (including a wheelchair)?: None Help needed walking in hospital room?: None Help needed climbing 3-5 steps with a railing? : A Little 6 Click Score: 23    End of Session Equipment Utilized During Treatment: Gait belt Activity Tolerance: Patient tolerated treatment well Patient left: in chair;with call bell/phone within reach Nurse Communication: Mobility status PT Visit Diagnosis: Other abnormalities of gait and mobility (R26.89)     Time: 0233-4356 PT Time Calculation (min) (ACUTE ONLY): 22 min  Charges:  $Gait Training: 8-22 mins                    G Codes:       [name], PT, DPT Acute Rehab Services  Pager: [place] 11/30/2017, 12:46 PM

## 2017-12-01 DIAGNOSIS — N186 End stage renal disease: Secondary | ICD-10-CM | POA: Diagnosis not present

## 2017-12-01 DIAGNOSIS — N2589 Other disorders resulting from impaired renal tubular function: Secondary | ICD-10-CM | POA: Diagnosis not present

## 2017-12-01 DIAGNOSIS — R82998 Other abnormal findings in urine: Secondary | ICD-10-CM | POA: Diagnosis not present

## 2017-12-01 DIAGNOSIS — N2581 Secondary hyperparathyroidism of renal origin: Secondary | ICD-10-CM | POA: Diagnosis not present

## 2017-12-01 DIAGNOSIS — K769 Liver disease, unspecified: Secondary | ICD-10-CM | POA: Diagnosis not present

## 2017-12-01 DIAGNOSIS — D509 Iron deficiency anemia, unspecified: Secondary | ICD-10-CM | POA: Diagnosis not present

## 2017-12-01 LAB — COMPREHENSIVE METABOLIC PANEL
ALT: 13 U/L — AB (ref 17–63)
AST: 17 U/L (ref 15–41)
Albumin: 2 g/dL — ABNORMAL LOW (ref 3.5–5.0)
Alkaline Phosphatase: 59 U/L (ref 38–126)
Anion gap: 14 (ref 5–15)
BILIRUBIN TOTAL: 0.6 mg/dL (ref 0.3–1.2)
BUN: 43 mg/dL — AB (ref 6–20)
CALCIUM: 8.3 mg/dL — AB (ref 8.9–10.3)
CO2: 27 mmol/L (ref 22–32)
CREATININE: 11.65 mg/dL — AB (ref 0.61–1.24)
Chloride: 89 mmol/L — ABNORMAL LOW (ref 101–111)
GFR calc Af Amer: 5 mL/min — ABNORMAL LOW (ref >60)
GFR, EST NON AFRICAN AMERICAN: 4 mL/min — AB (ref >60)
Glucose, Bld: 353 mg/dL — ABNORMAL HIGH (ref 65–99)
POTASSIUM: 3.6 mmol/L (ref 3.5–5.1)
Sodium: 130 mmol/L — ABNORMAL LOW (ref 135–145)
TOTAL PROTEIN: 5.3 g/dL — AB (ref 6.5–8.1)

## 2017-12-01 LAB — GLUCOSE, CAPILLARY
GLUCOSE-CAPILLARY: 75 mg/dL (ref 65–99)
GLUCOSE-CAPILLARY: 91 mg/dL (ref 65–99)
Glucose-Capillary: 497 mg/dL — ABNORMAL HIGH (ref 65–99)
Glucose-Capillary: 308 mg/dL — ABNORMAL HIGH (ref 65–99)
Glucose-Capillary: 95 mg/dL (ref 65–99)

## 2017-12-01 LAB — RENAL FUNCTION PANEL
Albumin: 2 g/dL — ABNORMAL LOW (ref 3.5–5.0)
Anion gap: 14 (ref 5–15)
BUN: 41 mg/dL — AB (ref 6–20)
CALCIUM: 8.3 mg/dL — AB (ref 8.9–10.3)
CO2: 27 mmol/L (ref 22–32)
CREATININE: 11.62 mg/dL — AB (ref 0.61–1.24)
Chloride: 89 mmol/L — ABNORMAL LOW (ref 101–111)
GFR, EST AFRICAN AMERICAN: 5 mL/min — AB (ref >60)
GFR, EST NON AFRICAN AMERICAN: 4 mL/min — AB (ref >60)
Glucose, Bld: 355 mg/dL — ABNORMAL HIGH (ref 65–99)
PHOSPHORUS: 5.5 mg/dL — AB (ref 2.5–4.6)
Potassium: 3.6 mmol/L (ref 3.5–5.1)
SODIUM: 130 mmol/L — AB (ref 135–145)

## 2017-12-01 LAB — CBC WITH DIFFERENTIAL/PLATELET
Basophils Absolute: 0 10*3/uL (ref 0.0–0.1)
Basophils Relative: 0 %
Eosinophils Absolute: 0.2 10*3/uL (ref 0.0–0.7)
Eosinophils Relative: 5 %
HEMATOCRIT: 33.6 % — AB (ref 39.0–52.0)
Hemoglobin: 10.7 g/dL — ABNORMAL LOW (ref 13.0–17.0)
LYMPHS PCT: 27 %
Lymphs Abs: 1.2 10*3/uL (ref 0.7–4.0)
MCH: 26.6 pg (ref 26.0–34.0)
MCHC: 31.8 g/dL (ref 30.0–36.0)
MCV: 83.6 fL (ref 78.0–100.0)
MONO ABS: 0.5 10*3/uL (ref 0.1–1.0)
Monocytes Relative: 12 %
NEUTROS ABS: 2.5 10*3/uL (ref 1.7–7.7)
Neutrophils Relative %: 56 %
Platelets: 210 10*3/uL (ref 150–400)
RBC: 4.02 MIL/uL — ABNORMAL LOW (ref 4.22–5.81)
RDW: 14.1 % (ref 11.5–15.5)
WBC: 4.4 10*3/uL (ref 4.0–10.5)

## 2017-12-01 LAB — MAGNESIUM: MAGNESIUM: 1.5 mg/dL — AB (ref 1.7–2.4)

## 2017-12-01 LAB — PHOSPHORUS: Phosphorus: 5.4 mg/dL — ABNORMAL HIGH (ref 2.5–4.6)

## 2017-12-01 MED ORDER — CLONIDINE 0.3 MG/24HR TD PTWK: 0.3000 mg | MEDICATED_PATCH | TRANSDERMAL | 12 refills | Status: DC

## 2017-12-01 MED ORDER — INSULIN GLARGINE 100 UNIT/ML ~~LOC~~ SOLN
12.0000 [IU] | Freq: Every day | SUBCUTANEOUS | Status: DC
  Administered 2017-12-01: 12 [IU] via SUBCUTANEOUS
  Filled 2017-12-01: qty 0.12

## 2017-12-01 MED ORDER — INSULIN ASPART 100 UNIT/ML ~~LOC~~ SOLN
0.0000 [IU] | Freq: Three times a day (TID) | SUBCUTANEOUS | Status: DC
  Administered 2017-12-01: 11 [IU] via SUBCUTANEOUS

## 2017-12-01 MED ORDER — INSULIN ASPART 100 UNIT/ML ~~LOC~~ SOLN
6.0000 [IU] | Freq: Once | SUBCUTANEOUS | Status: AC
  Administered 2017-12-01: 6 [IU] via SUBCUTANEOUS

## 2017-12-01 MED ORDER — FLUTICASONE PROPIONATE 50 MCG/ACT NA SUSP: 1.0000 | Freq: Every day | NASAL | 2 refills | Status: DC

## 2017-12-01 MED ORDER — LORATADINE 10 MG PO TABS: 10.0000 mg | ORAL_TABLET | Freq: Every day | ORAL | 0 refills | Status: DC

## 2017-12-01 MED ORDER — MAGNESIUM SULFATE 2 GM/50ML IV SOLN
2.0000 g | Freq: Once | INTRAVENOUS | Status: AC
  Administered 2017-12-01: 2 g via INTRAVENOUS
  Filled 2017-12-01: qty 50

## 2017-12-01 MED ORDER — AMLODIPINE BESYLATE 10 MG PO TABS: 10.0000 mg | ORAL_TABLET | Freq: Every day | ORAL | 0 refills | Status: AC

## 2017-12-01 MED ORDER — MECLIZINE HCL 12.5 MG PO TABS: 12.5000 mg | ORAL_TABLET | Freq: Three times a day (TID) | ORAL | 0 refills | Status: DC | PRN

## 2017-12-01 NOTE — Discharge Summary (Signed)
Physician Discharge Summary  Jared [name] BJS:[id number] DOB: 1956/03/08 DOA: 11/24/2017  PCP: Girtha Rm, NP-C  Admit date: 11/24/2017 Discharge date: 12/01/2017  Admitted From: Home Disposition:  Home with [facility]  Recommendations for Outpatient Follow-up:  1. Follow up with PCP in 1-2 weeks 2. Follow up with Nephrology Dr. Lorrene Reid as an outpatient  3. Follow up with Neurology Dr. [name] as an outpatient in 1-2 weeks 4. Follow up with Urology Dr. Tresa Moore within 1-2 weeks 5. Follow up with ENT as an outpatient  6. Please obtain CMP/CBC, Mag, Phos in one week 7. Please follow up on the following pending results:  Home Health: Yes Equipment/Devices: None recommended by PT    Discharge Condition: Stable   CODE STATUS: FULL CODE  Diet recommendation: Renal/Carb Modified Diet   Brief/Interim Summary: Jared [name] a 62 y.o.malewith medical history ofperitoneal dialysis, DM, HTN, seizures and other comorbids who presented with confusion. Patient's sister lives with him and had noted him complain of abdominal discomfort and with a poor appetite for about 4-5 days now. He has had some nausea. She states it is not uncommon for him to have GI issues like this. The day prior to admission, she noted at about 4 AM, he was quite confused. He did not know who she was. She states he skipped his dialysis yesterday evening which he does not usually do. Was admitted to [facility] Encephalopathy with Encephalopathy likely related to Uncontrolled BP. He was admitted to SDU and started on Nitro gtt and weaned off. Encephalopathy improved but he did complain about positional dizziness. Patient no longer dizzy after starting Meclizine. Patient was on Nitro gtt for several days and weaned off. Nephrology evaluated and increased some of his medications. He improved but the day of discharge BS went up significantly. Patient's BS was controlled prior to D/C and he was deemed medically stable to D/C  home and follow up with PCP, Neurology, Nephrology, and Urology at D/C.  Discharge Diagnoses:  Principal Problem:   Metabolic encephalopathy Active Problems:   CKD (chronic kidney disease) stage V requiring chronic dialysis (HCC)   Anemia of chronic disease   Accelerated hypertension   Type 2 diabetes mellitus with peripheral neuropathy (HCC)   Acute metabolic encephalopathy  Encephalopathy likley related to HTN Emergency, improved -Per chart review, he has similar presentation 3 times in the past with encephalopathy, very elevated blood pressure and very elevated blood sugar around 1000, he reports compliance with his meds and PD -Family denied seizure activity, family reports home meds keppra was recently discontinued.  -Encephalopathy Likely metabolic, with hypertension urgency, elevated blood sugar (>1000), bun, acidosis. - UDS negative, alcohol level less than 10, ct head no acute findings, ammonia level unremarkable, ua unremarkable, no fever, no leukocytosis, does not appear to have infection , pd fluids cell counts wbc 2, cultureno growth -EEG consistent with metabolic encephalopathy, no seizure spikes -MRI brainno acute findings, does show chronic sinusitis.  -Continue Keppra 250 mg po Daily -Neurology was consulted and appreciate evaluation -Control BS and BP; Started on Nitro gtt during hospitalization with improvement  -Follow up with PCP and Nephrology at D/C   Hypertension Emergency, improving -BP 261/128 on arrival, he was started on nitrodrip,  -Stepdown admission; Weaned Nitro gtt and Stopped yesterday -C/w Clonidine 0.3 mg TD Patch, Hydralazine 100 mg q8h, and Home BP meds -Given IV Labetalol 20 mg IV q2hprn for SBP >180 and Hold if HR <60 -BP improved and was 135/80 prior to D/C  ESRD On CPPD: -Nephrology consulted, will follow recommendations. -Patient's BUN/Cr went from 49/12.17 -> 48/12.29 -> 48/12.29 -> 41/11.62 -Per Nephrology will follow his progress  with CCPD or transition to HD and can be decided as an outpatient -Follow up with Nephrology as an outpatient   Insulin dependent DM2  -HbA1c 8.9,  -Continue to adjust Insulin as necessary .  -He presented with hyperglycemia, elevated anion gap of 26 on admission, but also has ESRD. Anion gap has improved, down from 26 to 14, -Hadhypoglycemia episode while NPO, Decreased Lantus but will need to increase back to 12 units at D/C  -Changed Sensitive Novolog SSI q4h to Sensitive Novolog SSI AC per Diabetic Education Coordinator Recc's and is on hypoglycemia protocol. -Follow up with Outpatient Diabetes Coordinator and made referral  -CBG's ranging from 92-497 (Was elevated in AM but better controlled in the afternoon -C/w Home medications   H/o? Seizure:  -Per family denied seizure activity. family reports home meds keppra was recently discontinued.  -No seizure activity observed in the hospital so far,  -EEG no seizure spikes, seizure precaution,  -Patient was started on iv keppra, transitioned to oral Keppra. -Neurology input appreciated and will have patient follow up with Dr. [name] as an outpatient.  H/o Prostate Cancer, -He reports getting shots q20months. -PSA severely Elevated 155.00 but per patient down from 300's -Will need outpatient Urology Follow Up with Dr. Tresa Moore  N/V likely from BPPV, improving  -family reports patient has intermittent n/v, poor appetite, wonder component of gastroparesis, kub no acute findings.  - he also reports left ear ringing and feeling dizzy with head movement , reports symptom started a week ago. -Suspect Recent n/v related to inner ear problem. Mri brain did confirm sinusitis.  -C/w flonase, claritin, prn meclizine, PT eval recommending Home Health at D/C  -Will need outpatient ENT follow up and possible vestibular rehab  Hypomagnesemia  -Patient's Mag was 1.5 -Replete with IV Mag Sulfate 2 grams prior to D/C -Continue to Monitor and  Replete as Necessary -Repeat Mag Level as an outpatient  Discharge Instructions  Discharge Instructions    Ambulatory referral to Nutrition and Diabetic Education   Complete by:  As directed    Uncontrolled Hyperglycemia in a ESRD patient   Call MD for:  difficulty breathing, headache or visual disturbances   Complete by:  As directed    Call MD for:  extreme fatigue   Complete by:  As directed    Call MD for:  hives   Complete by:  As directed    Call MD for:  persistant dizziness or light-headedness   Complete by:  As directed    Call MD for:  persistant nausea and vomiting   Complete by:  As directed    Call MD for:  redness, tenderness, or signs of infection (pain, swelling, redness, odor or green/yellow discharge around incision site)   Complete by:  As directed    Call MD for:  severe uncontrolled pain   Complete by:  As directed    Call MD for:  temperature >100.4   Complete by:  As directed    Diet - low sodium heart healthy   Complete by:  As directed    Diet Carb Modified   Complete by:  As directed    RENAL CARB MODIFIED   Discharge instructions   Complete by:  As directed    Follow up with PCP, Nephrology and Outpatient ENT. Take all medications as prescribed. If symptoms change or  worsen please return to the ED for Evaluation.   Increase activity slowly   Complete by:  As directed      Allergies as of 12/01/2017      Reactions   Penicillins Other (See Comments)   Has patient had a PCN reaction causing immediate rash, facial/tongue/throat swelling, SOB or lightheadedness with hypotension: Unk Has patient had a PCN reaction causing severe rash involving mucus membranes or skin necrosis: Unk Has patient had a PCN reaction that required hospitalization: Unk Has patient had a PCN reaction occurring within the last 10 years: Unk If all of the above answers are "NO", then may proceed with Cephalosporin use.      Medication List    STOP taking these medications    cloNIDine 0.1 MG tablet Commonly known as:  CATAPRES   NIFEdipine 90 MG 24 hr tablet Commonly known as:  PROCARDIA XL/ADALAT-CC     TAKE these medications   amLODipine 10 MG tablet Commonly known as:  NORVASC Take 1 tablet (10 mg total) by mouth daily.   atorvastatin 20 MG tablet Commonly known as:  LIPITOR Take 1 tablet (20 mg total) by mouth daily.   cloNIDine 0.3 mg/24hr patch Commonly known as:  CATAPRES - Dosed in mg/24 hr Place 1 patch (0.3 mg total) onto the skin once a week.   fluticasone 50 MCG/ACT nasal spray Commonly known as:  FLONASE Place 1 spray into both nostrils daily.   hydrALAZINE 100 MG tablet Commonly known as:  APRESOLINE Take 1 tablet (100 mg total) by mouth 3 (three) times daily.   insulin NPH Human 100 UNIT/ML injection Commonly known as:  NOVOLIN N Inject 20 units every morning What changed:    how much to take  how to take this  when to take this  additional instructions   isosorbide mononitrate 30 MG 24 hr tablet Commonly known as:  IMDUR TAKE 1 TABLET BY MOUTH ONCE DAILY   loratadine 10 MG tablet Commonly known as:  CLARITIN Take 1 tablet (10 mg total) by mouth daily.   meclizine 12.5 MG tablet Commonly known as:  ANTIVERT Take 1 tablet (12.5 mg total) by mouth 3 (three) times daily as needed for dizziness.   metoprolol tartrate 100 MG tablet Commonly known as:  LOPRESSOR Take 1 tablet (100 mg total) by mouth 2 (two) times daily.   multivitamin Tabs tablet Take 1 tablet by mouth daily.   pantoprazole 40 MG tablet Commonly known as:  PROTONIX Take 1 tablet (40 mg total) by mouth daily at 6 (six) AM.   sevelamer carbonate 800 MG tablet Commonly known as:  RENVELA Take 3 tablets (2,400 mg total) by mouth 3 (three) times daily with meals.      Follow-up Information    Henson, Vickie L, NP-C Follow up in 1 week(s).   Specialty:  Family Medicine Why:  hospital discharge follow up.  pmd to refer to ENT for tinnitus left  ear Contact information: [place] [address] [phone]        Jamal Maes, MD Follow up.   Specialty:  Nephrology Contact information: [place] [address] [phone]        Garvin Fila, MD Follow up.   Specialties:  Neurology, Radiology Why:  Follow up for Seizure Disorder within 1 week  Contact information: [address] [place] [address] [place] Follow up.   Contact information: [address] [place]       Alexis Frock, MD. Call.   Specialty:  Urology Why:  Follow up with Dr. Tresa Moore within 1 week to follow Prostate Cancer Contact information: [place] [address] [phone]        [facility] Follow up.   Specialty:  Home Health Services Contact information: [place] [address] [phone]          Allergies  Allergen Reactions  . Penicillins Other (See Comments)    Has patient had a PCN reaction causing immediate rash, facial/tongue/throat swelling, SOB or lightheadedness with hypotension: Unk Has patient had a PCN reaction causing severe rash involving mucus membranes or skin necrosis: Unk Has patient had a PCN reaction that required hospitalization: Unk Has patient had a PCN reaction occurring within the last 10 years: Unk If all of the above answers are "NO", then may proceed with Cephalosporin use.    Consultations:  Nephrology Dr. Erling Cruz  Procedures/Studies: Ct Head Wo Contrast  Result Date: 11/24/2017 CLINICAL DATA:  Altered mental status. EXAM: CT HEAD WITHOUT CONTRAST TECHNIQUE: Contiguous axial images were obtained from the base of the skull through the vertex without intravenous contrast. COMPARISON:  MRI brain dated March 17, 2017. CT head dated March 13, 2017. FINDINGS: Despite efforts by the technologist and  patient, motion artifact is present on today's exam and could not be eliminated. This reduces exam sensitivity and specificity. Brain: No evidence of acute infarction, hemorrhage, hydrocephalus, extra-axial collection or mass lesion/mass effect. Stable mild cerebral atrophy. Vascular: No hyperdense vessel or unexpected calcification. Skull: Normal. Negative for fracture. Stable ground-glass, slightly expansile lesion in the anteromedial wall left maxillary sinus. Sinuses/Orbits: No acute finding. Other: None. IMPRESSION: 1. Slightly limited study due to motion artifact. No acute intracranial abnormality. Electronically Signed   By: Titus Dubin M.D.   On: 11/24/2017 10:06   Mr Brain Wo Contrast  Result Date: 11/27/2017 CLINICAL DATA:  Altered level of consciousness.  Possible seizure. EXAM: MRI HEAD WITHOUT CONTRAST TECHNIQUE: Multiplanar, multiecho pulse sequences of the brain and surrounding structures were obtained without intravenous contrast. COMPARISON:  CT head without contrast 11/24/2017. MRI brain 03/17/2017 FINDINGS: Brain: The study is mildly degraded by patient motion. Diffusion-weighted images demonstrate no acute or subacute infarction. Mild generalized atrophy is stable. No significant white matter disease is present. The ventricles are proportionate to the degree of atrophy. The internal auditory canals are within normal limits. The brainstem and cerebellum are normal. Vascular: Flow is present in the major intracranial arteries. Skull and upper cervical spine: The skullbase is within normal limits. The craniocervical junction is normal. Marrow signal is normal. Sinuses/Orbits: Circumferential mucosal thickening is present in the maxillary sinuses bilaterally, right greater than left. The remaining paranasal sinuses and the mastoid air cells are clear. The globes and orbits are within normal limits. IMPRESSION: 1. No acute intracranial abnormality or significant interval change. 2. The study is  mildly degraded by patient motion. 3. Age advanced atrophy. 4. 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 Electronically Signed   By: San Morelle M.D.   On: 11/27/2017 16:47   Dg Chest Port 1 View  Result Date: 11/24/2017 CLINICAL DATA:  Altered mental status. EXAM: PORTABLE CHEST 1 VIEW COMPARISON:  03/14/2017 FINDINGS: Technique limitation, with exclusion of a portion of the lower chest. Normal heart size. Overlying support apparatus projecting over the lower heart. No large pleural fluid  or pneumothorax. Upper lungs clear. IMPRESSION: Moderate limitations, as detailed above. Given these limitations, no acute disease. Electronically Signed   By: Abigail Miyamoto M.D.   On: 11/24/2017 10:03   Dg Abd Portable 1v  Result Date: 11/27/2017 CLINICAL DATA:  Nausea vomiting EXAM: PORTABLE ABDOMEN - 1 VIEW COMPARISON:  CT abdomen pelvis March 15, 2017 FINDINGS: The bowel gas pattern is normal. A pigtail drainage tube is projected over the left abdomen. No radio-opaque calculi or other significant radiographic abnormality are seen. IMPRESSION: No bowel obstruction.  Drainage tube projected over left abdomen. Electronically Signed   By: Abelardo Diesel M.D.   On: 11/27/2017 16:04   EEG ANALYSIS: A 16 channel recording using standard 10 20 measurements is conducted for 43 minutes.   The background activity gets as high as six hertz.  Awake and drowsy activities are observed.  Photic stimulation and hyperventilation are not conducted.  There is no focal or lateralized slowing.  There is no epileptiform activity is observed. Occasional frontal intermittent rhythmic delta activity is observed.   IMPRESSION: 1.   This recording of the awake and drowsy states shows moderate global slowing indicating a moderate global encephalopathy.  However, there is no epileptiform activity is observed.  2.   Occasional frontal intermittent rhythmic delta activity is observed which is typically seen in toxic  metabolic processes.  Subjective: Seen and examined and was doing well. Alert and oriented. Blood Sugar was elevated this AM but his Lantus was decreased last night. Given Correction insulin and improved. No CP or SOB. Wanting to go home.   Discharge Exam: Vitals:   12/01/17 0746 12/01/17 1201  BP: (!) 153/81 135/80  Pulse: 93 100  Resp: (!) 21 14  Temp: 98.7 F (37.1 C) 98.1 F (36.7 C)  SpO2: 99% 99%   Vitals:   12/01/17 0614 12/01/17 0746 12/01/17 0815 12/01/17 1201  BP: 132/74 (!) 153/81  135/80  Pulse:  93  100  Resp:  (!) 21  14  Temp:  98.7 F (37.1 C)  98.1 F (36.7 C)  TempSrc:  Oral  Oral  SpO2:  99%  99%  Weight:   63.9 kg (140 lb 14 oz)   Height:       General: Pt is alert, awake, not in acute distress Cardiovascular: RRR, S1/S2 +, no rubs, no gallops Respiratory: Diminished bilaterally, no wheezing, no rhonchi Abdominal: Soft, NT, ND, bowel sounds +; Peritoneal Dialysis Catheter in place.  Extremities: no edema, no cyanosis  The results of significant diagnostics from this hospitalization (including imaging, microbiology, ancillary and laboratory) are listed below for reference.    Microbiology: No results found for this or any previous visit (from the past 240 hour(s)). Labs: BNP (last 3 results) No results for input(s): BNP in the last 8760 hours. Basic Metabolic Panel: Recent Labs  Lab 11/29/17 0313 11/29/17 1141 11/30/17 0241 12/01/17 0317  NA 131*  --  131* 130*  130*  K 3.8  --  3.5 3.6  3.6  CL 89*  --  90* 89*  89*  CO2 30  --  28 27  27   GLUCOSE 322*  --  124* 353*  355*  BUN 49*  --  48* 43*  41*  CREATININE 12.17*  --  12.29* 11.65*  11.62*  CALCIUM 8.2*  --  8.2* 8.3*  8.3*  MG  --  1.5* 1.4* 1.5*  PHOS 4.9* 5.3* 4.9* 5.4*  5.5*   Liver Function Tests: Recent Labs  Lab 11/29/17 0313 11/30/17 0241 12/01/17 0317  AST  --  19 17  ALT  --  12* 13*  ALKPHOS  --  57 59  BILITOT  --  0.8 0.6  PROT  --  5.2* 5.3*   ALBUMIN 2.2* 2.1* 2.0*  2.0*   No results for input(s): LIPASE, AMYLASE in the last 168 hours. No results for input(s): AMMONIA in the last 168 hours. CBC: Recent Labs  Lab 11/29/17 1141 11/30/17 0241 12/01/17 0317  WBC 6.6 5.6 4.4  NEUTROABS 4.0 2.9 2.5  HGB 11.3* 10.4* 10.7*  HCT 33.7* 32.3* 33.6*  MCV 84.7 83.5 83.6  PLT 210 195 210   Cardiac Enzymes: No results for input(s): CKTOTAL, CKMB, CKMBINDEX, TROPONINI in the last 168 hours. BNP: Invalid input(s): POCBNP CBG: Recent Labs  Lab 12/01/17 0746 12/01/17 1159 12/01/17 1438 12/01/17 1530 12/01/17 1610  GLUCAP 497* 308* 95 75 91   D-Dimer No results for input(s): DDIMER in the last 72 hours. Hgb A1c No results for input(s): HGBA1C in the last 72 hours. Lipid Profile No results for input(s): CHOL, HDL, LDLCALC, TRIG, CHOLHDL, LDLDIRECT in the last 72 hours. Thyroid function studies No results for input(s): TSH, T4TOTAL, T3FREE, THYROIDAB in the last 72 hours.  Invalid input(s): FREET3 Anemia work up No results for input(s): VITAMINB12, FOLATE, FERRITIN, TIBC, IRON, RETICCTPCT in the last 72 hours. Urinalysis    Component Value Date/Time   COLORURINE YELLOW 11/24/2017 0954   APPEARANCEUR HAZY (A) 11/24/2017 0954   LABSPEC 1.010 11/24/2017 0954   PHURINE 8.0 11/24/2017 0954   GLUCOSEU >=500 (A) 11/24/2017 0954   HGBUR SMALL (A) 11/24/2017 0954   BILIRUBINUR NEGATIVE 11/24/2017 0954   KETONESUR 80 (A) 11/24/2017 0954   PROTEINUR >=300 (A) 11/24/2017 0954   NITRITE NEGATIVE 11/24/2017 0954   LEUKOCYTESUR NEGATIVE 11/24/2017 0954   Sepsis Labs Invalid input(s): PROCALCITONIN,  WBC,  LACTICIDVEN Microbiology No results found for this or any previous visit (from the past 240 hour(s)).   Time coordinating discharge: 35 minutes  SIGNED:  Kerney Elbe, DO [facility] 12/05/2017, 3:39 PM Pager [phone]  If 7PM-7AM, please contact night-coverage [email or web address] Password [email or web address]

## 2017-12-01 NOTE — Progress Notes (Signed)
Rechecked BG was 91 and patient stated that he is getting feel better. Patient's sister took his belongings and patient left to home. HS [employer]

## 2017-12-01 NOTE — Progress Notes (Signed)
PD tx completed overnight without issue.  UF 1672  Pt blood sugar elevated this AM, this RN noted no change in PD orders that may have resulted in elevated blood glucose. ' Catheter capped, clamped, pinned, and secured to patient.  Will continue to monitor.

## 2017-12-01 NOTE — Progress Notes (Signed)
BG was 497 in the morning and notified Dr. Alfredia Ferguson hyperglycemia and ordered 6 units of insulin. Patient finished peritoneal dialysis and called HD nurse. HD nurse disconnected and put on the new dressing. Dialysate dextro dose was not changed. nephrologist talked with patient and told this concern of hyperglycemia. He didn't much give any orders. Patient wanted to go home, called Dr. Alfredia Ferguson regarding this and also talked patient. Dr. Alfredia Ferguson ordered sending him home. BG was 308 in the lunch time and administered insulin 11 units. Removed PIV access, patient received discharge instructions with her sister and answered all questions. Pt took a shower then he felt lower glucose. Checked BG was 95. Patient took a cup of juice and rechecked BG was 75. Continue to monitor BG then sending him home. HS [employer]

## 2017-12-01 NOTE — Progress Notes (Signed)
Assessment/ Plan:  1. AMS- likely related to hypertensive urgency as well as metabolic encephalopathy,improvedalthough I worry about non-adherence to PD or PF failure 2. ESRD-Will follow his progress whether to proceed with CCPD or transition to HD. Can decide as OP. 3. Hypertensive urgency- markedly improved with meds, 4. Vascular access- has LUE AVF +T/B 5. Seizure disorder- on Keppra 6. Disposition-Cont home PD and Ok from renal perspective for DC when home issues clarified  Subjective: Interval History: -1672 with dialysis  Objective: Vital signs in last 24 hours: Temp:  [98.1 F (36.7 C)-98.7 F (37.1 C)] 98.1 F (36.7 C) (01/05 1201) Pulse Rate:  [89-100] 100 (01/05 1201) Resp:  [14-21] 14 (01/05 1201) BP: (132-153)/(62-96) 135/80 (01/05 1201) SpO2:  [95 %-99 %] 99 % (01/05 1201) Weight change:   Intake/Output from previous day: 01/04 0701 - 01/05 0700 In: 16109 [P.O.:430] Out: 60454 [Urine:100] Intake/Output this shift: Total I/O In: 15114 [P.O.:120; UJWJX:91478] Out: 29562 [Other:16666]  General appearance: alert and cooperative Cardio: regular rate and rhythm, S1, S2 normal, no murmur, click, rub or gallop GI: PD cath Extremities: extremities normal, atraumatic, no cyanosis or edema, LUE ok AV  Lab Results: Recent Labs    11/30/17 0241 12/01/17 0317  WBC 5.6 4.4  HGB 10.4* 10.7*  HCT 32.3* 33.6*  PLT 195 210   BMET:  Recent Labs    11/30/17 0241 12/01/17 0317  NA 131* 130*  130*  K 3.5 3.6  3.6  CL 90* 89*  89*  CO2 28 27  27   GLUCOSE 124* 353*  355*  BUN 48* 43*  41*  CREATININE 12.29* 11.65*  11.62*  CALCIUM 8.2* 8.3*  8.3*   No results for input(s): PTH in the last 72 hours. Iron Studies: No results for input(s): IRON, TIBC, TRANSFERRIN, FERRITIN in the last 72 hours. Studies/Results: No results found.  Scheduled: . amLODipine  10 mg Oral Daily  . chlorhexidine  15 mL Mouth Rinse BID  . cloNIDine  0.3 mg Transdermal  Weekly  . fluticasone  1 spray Each Nare Daily  . gentamicin cream  1 application Topical Daily  . heparin  5,000 Units Subcutaneous Q8H  . hydrALAZINE  100 mg Oral Q8H  . insulin aspart  0-15 Units Subcutaneous TID WC  . insulin glargine  12 Units Subcutaneous Daily  . levETIRAcetam  250 mg Oral Daily  . loratadine  10 mg Oral Daily  . pantoprazole  40 mg Oral Daily  . sodium chloride flush  3 mL Intravenous Q12H    LOS: 7 days   Estanislado Emms 12/01/2017,2:11 PM

## 2017-12-02 DIAGNOSIS — R82998 Other abnormal findings in urine: Secondary | ICD-10-CM | POA: Diagnosis not present

## 2017-12-02 DIAGNOSIS — N2581 Secondary hyperparathyroidism of renal origin: Secondary | ICD-10-CM | POA: Diagnosis not present

## 2017-12-02 DIAGNOSIS — D509 Iron deficiency anemia, unspecified: Secondary | ICD-10-CM | POA: Diagnosis not present

## 2017-12-02 DIAGNOSIS — K769 Liver disease, unspecified: Secondary | ICD-10-CM | POA: Diagnosis not present

## 2017-12-02 DIAGNOSIS — N186 End stage renal disease: Secondary | ICD-10-CM | POA: Diagnosis not present

## 2017-12-02 DIAGNOSIS — N2589 Other disorders resulting from impaired renal tubular function: Secondary | ICD-10-CM | POA: Diagnosis not present

## 2017-12-03 ENCOUNTER — Telehealth: Payer: Self-pay | Admitting: Family Medicine

## 2017-12-03 ENCOUNTER — Telehealth: Payer: Self-pay | Admitting: Neurology

## 2017-12-03 DIAGNOSIS — D509 Iron deficiency anemia, unspecified: Secondary | ICD-10-CM | POA: Diagnosis not present

## 2017-12-03 DIAGNOSIS — N2589 Other disorders resulting from impaired renal tubular function: Secondary | ICD-10-CM | POA: Diagnosis not present

## 2017-12-03 DIAGNOSIS — N186 End stage renal disease: Secondary | ICD-10-CM | POA: Diagnosis not present

## 2017-12-03 DIAGNOSIS — K769 Liver disease, unspecified: Secondary | ICD-10-CM | POA: Diagnosis not present

## 2017-12-03 DIAGNOSIS — R82998 Other abnormal findings in urine: Secondary | ICD-10-CM | POA: Diagnosis not present

## 2017-12-03 DIAGNOSIS — N2581 Secondary hyperparathyroidism of renal origin: Secondary | ICD-10-CM | POA: Diagnosis not present

## 2017-12-03 NOTE — Telephone Encounter (Signed)
Bernadette/pt's sister [phone] called request refill for levETIRAcetam (KEPPRA) 250 MG tablet sent to [facility]. Pt was discharged from hospital 12/01/17 for seizures, pt is out of the medication. She said the pt says he was advised to stop the medication but she is not sure what provider advised him. Please check hospital notes, I scheduled a f/u appt with [facility] for tomorrow, is this pt ok to NP? She is asking to please call pt's sister [name] at [phone] for confirmation the refill will be sent.

## 2017-12-03 NOTE — Telephone Encounter (Signed)
Jared [name] with [facility] called and stated that pt did not set up services today. He was referred by hospital for home health and when pt was called he stated he needed to discuss with his family. Has not called back as of yet. [name] can be reached at [phone].

## 2017-12-03 NOTE — Telephone Encounter (Signed)
I will see the patient tomorrow. Hospital discharge note states to continue Keppra 250 mg BID.

## 2017-12-03 NOTE — Telephone Encounter (Signed)
Please call pts sister at [phone]

## 2017-12-03 NOTE — Telephone Encounter (Signed)
Pt was called and I spoke to his sister. She is on [employer]. She states he is feeling pretty good but still weak. Current and discharge medications were reconciled. Pt made an appt with Vickie for this week. Pt was informed to bring all medications with him. After hour protocol was discussed and she verified understanding.

## 2017-12-03 NOTE — Telephone Encounter (Signed)
Please call and see what his plan is for home health.

## 2017-12-04 ENCOUNTER — Encounter: Payer: Self-pay | Admitting: Adult Health

## 2017-12-04 ENCOUNTER — Ambulatory Visit (INDEPENDENT_AMBULATORY_CARE_PROVIDER_SITE_OTHER): Payer: Medicare Other | Admitting: Adult Health

## 2017-12-04 VITALS — BP 106/59 | HR 71 | Wt 155.0 lb

## 2017-12-04 DIAGNOSIS — G9341 Metabolic encephalopathy: Secondary | ICD-10-CM | POA: Diagnosis not present

## 2017-12-04 DIAGNOSIS — K769 Liver disease, unspecified: Secondary | ICD-10-CM | POA: Diagnosis not present

## 2017-12-04 DIAGNOSIS — N186 End stage renal disease: Secondary | ICD-10-CM | POA: Diagnosis not present

## 2017-12-04 DIAGNOSIS — N2581 Secondary hyperparathyroidism of renal origin: Secondary | ICD-10-CM | POA: Diagnosis not present

## 2017-12-04 DIAGNOSIS — R569 Unspecified convulsions: Secondary | ICD-10-CM

## 2017-12-04 DIAGNOSIS — R82998 Other abnormal findings in urine: Secondary | ICD-10-CM | POA: Diagnosis not present

## 2017-12-04 DIAGNOSIS — D509 Iron deficiency anemia, unspecified: Secondary | ICD-10-CM | POA: Diagnosis not present

## 2017-12-04 DIAGNOSIS — N2589 Other disorders resulting from impaired renal tubular function: Secondary | ICD-10-CM | POA: Diagnosis not present

## 2017-12-04 MED ORDER — LEVETIRACETAM 250 MG PO TABS: 250.0000 mg | ORAL_TABLET | Freq: Two times a day (BID) | ORAL | 5 refills | Status: DC

## 2017-12-04 NOTE — Progress Notes (Signed)
PATIENT: Jared [name] DOB: [birth year]  REASON FOR VISIT: follow up HISTORY FROM: patient  HISTORY OF PRESENT ILLNESS: Today 12/04/17 [name] is a 62 year old male with a history of with a history of seizure event.  He returns today after hospitalization.  The patient was recently hospitalized for metabolic encephalopathy.  He presented to the emergency room with hypertension and blood sugar greater than 1000.  The patient is also end-stage renal disease.  The patient had a questionable seizure event in the hospital.  He was evaluated by the hospital neurologist who recommended restarting Keppra 250 mg twice a day with the plan of eventually tapering off this medication in the future.  It was felt that the patient's seizure event was caused by encephalopathy r/t to hypertension and hyperglycemia.  The patient reports no additional seizure activity.  He has actually only been taking Keppra 250 mg daily.  He returns today for an evaluation.  HISTORY Mr Jared [name] is a 9 year African-American male who had a episode of witnessed seizure in the hospital when he was admitted recently.Jared [name] an 62 y.o.malewho presents to the ED via EMS after sudden onset of unresponsiveness at home.   His first symptom was severe headache, unknown if lateralized. Symptoms progressed to include right sided tingling, RUE jerking, leaning to the right and aphasia. He then, per family, leaned forward in his chair without complete loss of postural tone and started "jerking all over". At the time of EMS arrival, the jerking had stopped. On arrival to the ED, he began seizing. Seizure was generalized, with eyes deviated to the right and right >left jerking movements. Seizure was aborted with 2 mg IV Ativan.  CBG was 700 on arrival. Severely hypertensive and tachycardic as well.  Brief Neurological exam performed after Ativan administration andprior to intubation (premedicated with Etomidate 20mg  IV  andRocuronium 80 mg IV,followed by propofol gtt at 10 mcg/kg/min)  [facility] neurohospitalist Dr [name] revealed decreased tone in all 4 extremities with slightly more movement on the right relative to the left with noxious stimuli. Pupils were pinpoint and unreactive. Neck was supple. Sonorous breathing. Unarousable with sternal rub. Eyes were closed but remained partially open after eyelids manually raised. No doll's eye reflex was present. Reflexes were hypoactive x 4 with mute plantars.  Patient did not abuse alcohol or benzodiazepines per family. EEG was abnormal but showed only mild generalized nonspecific slowing without definite epileptiform activity. MRI scan the brain did not show any acute infarct or other pathology. Patient was started on Keppra and is seizure was felt to be symptomatic from significantly elevated blood sugar of 700. Patient was initially started on Versed drip which was tapered. Patient was advised to taper his Keppra over a week which she has done. He has had no further recurrent seizures. 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 Patient was asked not to drive till he saw me but still has been doing some limited driving without any events. He has no prior history of seizures ,significant head injury with loss of consciousness. He denies drinking significant amounts of alcohol or doing any street drugs. There is no family history of epilepsy.   REVIEW OF SYSTEMS: Out of a complete 14 system review of symptoms, the patient complains only of the following symptoms, and all other reviewed systems are negative.  ALLERGIES: Allergies  Allergen Reactions  . Penicillins Other (See Comments)    Has patient had a PCN reaction  causing immediate rash, facial/tongue/throat swelling, SOB or lightheadedness with hypotension: Unk Has patient had a PCN reaction causing severe rash involving mucus membranes or skin necrosis: Unk Has  patient had a PCN reaction that required hospitalization: Unk Has patient had a PCN reaction occurring within the last 10 years: Unk If all of the above answers are "NO", then may proceed with Cephalosporin use.     HOME MEDICATIONS: Outpatient Medications Prior to Visit  Medication Sig Dispense Refill  . amLODipine (NORVASC) 10 MG tablet Take 1 tablet (10 mg total) by mouth daily. 30 tablet 0  . atorvastatin (LIPITOR) 20 MG tablet Take 1 tablet (20 mg total) by mouth daily. 90 tablet 3  . cloNIDine (CATAPRES - DOSED IN MG/24 HR) 0.3 mg/24hr patch Place 1 patch (0.3 mg total) onto the skin once a week. 4 patch 12  . fluticasone (FLONASE) 50 MCG/ACT nasal spray Place 1 spray into both nostrils daily. 16 g 2  . hydrALAZINE (APRESOLINE) 100 MG tablet Take 1 tablet (100 mg total) by mouth 3 (three) times daily. 90 tablet 0  . insulin NPH Human (NOVOLIN N) 100 UNIT/ML injection Inject 20 units every morning (Patient taking differently: Inject 30 Units into the skin daily before breakfast. ) 10 mL 5  . isosorbide mononitrate (IMDUR) 30 MG 24 hr tablet TAKE 1 TABLET BY MOUTH ONCE DAILY 90 tablet 1  . loratadine (CLARITIN) 10 MG tablet Take 1 tablet (10 mg total) by mouth daily. 30 tablet 0  . meclizine (ANTIVERT) 12.5 MG tablet Take 1 tablet (12.5 mg total) by mouth 3 (three) times daily as needed for dizziness. 30 tablet 0  . metoprolol tartrate (LOPRESSOR) 100 MG tablet Take 1 tablet (100 mg total) by mouth 2 (two) times daily. 60 tablet 1  . multivitamin (RENA-VIT) TABS tablet Take 1 tablet by mouth daily.    . pantoprazole (PROTONIX) 40 MG tablet Take 1 tablet (40 mg total) by mouth daily at 6 (six) AM. 30 tablet 1  . sevelamer carbonate (RENVELA) 800 MG tablet Take 3 tablets (2,400 mg total) by mouth 3 (three) times daily with meals. 240 tablet 1  . levETIRAcetam (KEPPRA) 250 MG tablet Take 1 tablet (250 mg total) by mouth daily. (Patient not taking: Reported on 12/04/2017) 30 tablet 0   No  facility-administered medications prior to visit.     PAST MEDICAL HISTORY: Past Medical History:  Diagnosis Date  . Anemia   . Chronic kidney disease    on dialysis M-W-F  . Diabetes mellitus without complication ([place])    onset as adult  . Hypertension   . Seizures ([place])   . Stroke ([place])   . Thyroid disease    hyperparathyroidism    PAST SURGICAL HISTORY: Past Surgical History:  Procedure Laterality Date  . AV FISTULA PLACEMENT Left 06/04/2014   Procedure: ARTERIOVENOUS (AV) FISTULA CREATION-  BRACHIOCEPHALIC WITH LIGATION OF COMPETEING BRANCH;  Surgeon: Mal Misty, MD;  Location: [facility];  Service: Vascular;  Laterality: Left;  . INSERTION OF DIALYSIS CATHETER  04/2014  . IR GENERIC HISTORICAL Left 11/30/2016   IR THROMBECTOMY AV FISTULA W/THROMBOLYSIS/PTA/STENT INC/SHUNT/IMG LT 11/30/2016 Markus Daft, MD [facility]  . IR GENERIC HISTORICAL  11/30/2016   IR US GUIDE VASC ACCESS LEFT 11/30/2016 Markus Daft, MD [facility]    FAMILY HISTORY: Family History  Problem Relation Age of Onset  . Cancer - Lung Mother   . Cancer - Other Father   . Diabetes Neg Hx  SOCIAL HISTORY: Social History   Socioeconomic History  . Marital status: Divorced    Spouse name: Not on file  . Number of children: Not on file  . Years of education: Not on file  . Highest education level: Not on file  Social Needs  . Financial resource strain: Not on file  . Food insecurity - worry: Not on file  . Food insecurity - inability: Not on file  . Transportation needs - medical: Not on file  . Transportation needs - non-medical: Not on file  Occupational History  . Not on file  Tobacco Use  . Smoking status: Former Smoker    Last attempt to quit: 05/11/1984    Years since quitting: 33.5  . Smokeless tobacco: Never Used  Substance and Sexual Activity  . Alcohol use: Yes    Alcohol/week: 0.6 oz    Types: 1 Glasses of wine per week    Comment: occasional  . Drug use: No  . Sexual activity: Not  on file  Other Topics Concern  . Not on file  Social History Narrative  . Not on file      PHYSICAL EXAM  Vitals:   12/04/17 1101  BP: (!) 106/59  Pulse: 71  Weight: 155 lb (70.3 kg)   Body mass index is 25.02 kg/m.  Generalized: Well developed, in no acute distress   Neurological examination  Mentation: Alert oriented to time, place, history taking. Follows all commands speech and language fluent Cranial nerve II-XII: Pupils were equal round reactive to light. Extraocular movements were full, visual field were full on confrontational test. Facial sensation and strength were normal. Uvula tongue midline. Head turning and shoulder shrug  were normal and symmetric. Motor: The motor testing reveals 5 over 5 strength of all 4 extremities. Good symmetric motor tone is noted throughout.  Sensory: Sensory testing is intact to soft touch on all 4 extremities. No evidence of extinction is noted.  Coordination: Cerebellar testing reveals good finger-nose-finger and heel-to-shin bilaterally.  Gait and station: Gait is normal. Reflexes: Deep tendon reflexes are symmetric and normal bilaterally.   DIAGNOSTIC DATA (LABS, IMAGING, TESTING) - I reviewed patient records, labs, notes, testing and imaging myself where available.  Lab Results  Component Value Date   WBC 4.4 12/01/2017   HGB 10.7 (L) 12/01/2017   HCT 33.6 (L) 12/01/2017   MCV 83.6 12/01/2017   PLT 210 12/01/2017      Component Value Date/Time   NA 130 (L) 12/01/2017 0317   NA 130 (L) 12/01/2017 0317   K 3.6 12/01/2017 0317   K 3.6 12/01/2017 0317   CL 89 (L) 12/01/2017 0317   CL 89 (L) 12/01/2017 0317   CO2 27 12/01/2017 0317   CO2 27 12/01/2017 0317   GLUCOSE 355 (H) 12/01/2017 0317   GLUCOSE 353 (H) 12/01/2017 0317   BUN 41 (H) 12/01/2017 0317   BUN 43 (H) 12/01/2017 0317   CREATININE 11.62 (H) 12/01/2017 0317   CREATININE 11.65 (H) 12/01/2017 0317   CREATININE 10.58 (H) 01/04/2017 1638   CALCIUM 8.3 (L)  12/01/2017 0317   CALCIUM 8.3 (L) 12/01/2017 0317   PROT 5.3 (L) 12/01/2017 0317   ALBUMIN 2.0 (L) 12/01/2017 0317   ALBUMIN 2.0 (L) 12/01/2017 0317   AST 17 12/01/2017 0317   ALT 13 (L) 12/01/2017 0317   ALKPHOS 59 12/01/2017 0317   BILITOT 0.6 12/01/2017 0317   GFRNONAA 4 (L) 12/01/2017 0317   GFRNONAA 4 (L) 12/01/2017 6270   GFRAA  5 (L) 12/01/2017 0317   GFRAA 5 (L) 12/01/2017 0317   Lab Results  Component Value Date   CHOL 150 03/14/2017   HDL 64 03/14/2017   LDLCALC 79 03/14/2017   TRIG 37 03/14/2017   CHOLHDL 2.3 03/14/2017   Lab Results  Component Value Date   HGBA1C 8.9 (H) 11/26/2017   Lab Results  Component Value Date   VITAMINB12 1,412 (H) 11/27/2017   Lab Results  Component Value Date   TSH 1.403 11/27/2017      ASSESSMENT AND PLAN 62 y.o. year old male  has a past medical history of Anemia, Chronic kidney disease, Diabetes mellitus without complication ([place]), Hypertension, Seizures ([place]), Stroke ([place]), and Thyroid disease. here with:   1.  Seizure event 2.  Metabolic encephalopathy  The patient's EEG while in the hospital did not show eplieptiform activity.  He will take Keppra 250 mg twice a day.  If he continues to remain stable with no seizure-like activity.  We will began tapering the patient off of Keppra at the next visit.  He is advised that if he has any seizure-like events he should let us know.  He will follow-up in 2 months or sooner if needed.    Ward Givens, MSN, NP-C 12/04/2017, 11:06 AM [name] [address] [place], [place] [address] [phone]

## 2017-12-04 NOTE — Telephone Encounter (Signed)
Patient has follow up with NP today.

## 2017-12-04 NOTE — Telephone Encounter (Signed)
Patient advised he is not going to do home health

## 2017-12-04 NOTE — Patient Instructions (Signed)
Your Plan:  Continue Keppra 250 mg twice a day until the next visit.  At the next visit we will consider weaning you off Keppra If your symptoms worsen or you develop new symptoms please let us know.   Thank you for coming to see [place] at [facility]. I hope we have been able to provide you high quality care today.  You may receive a patient satisfaction survey over the next few weeks. We would appreciate your feedback and comments so that we may continue to improve ourselves and the health of our patients.

## 2017-12-05 ENCOUNTER — Encounter: Payer: Self-pay | Admitting: Family Medicine

## 2017-12-05 ENCOUNTER — Ambulatory Visit (INDEPENDENT_AMBULATORY_CARE_PROVIDER_SITE_OTHER): Payer: Medicare Other | Admitting: Family Medicine

## 2017-12-05 VITALS — BP 120/72 | HR 66 | Ht 67.5 in | Wt 156.6 lb

## 2017-12-05 DIAGNOSIS — Z09 Encounter for follow-up examination after completed treatment for conditions other than malignant neoplasm: Secondary | ICD-10-CM

## 2017-12-05 DIAGNOSIS — H9312 Tinnitus, left ear: Secondary | ICD-10-CM | POA: Diagnosis not present

## 2017-12-05 DIAGNOSIS — K769 Liver disease, unspecified: Secondary | ICD-10-CM | POA: Diagnosis not present

## 2017-12-05 DIAGNOSIS — N186 End stage renal disease: Secondary | ICD-10-CM | POA: Diagnosis not present

## 2017-12-05 DIAGNOSIS — I10 Essential (primary) hypertension: Secondary | ICD-10-CM

## 2017-12-05 DIAGNOSIS — R82998 Other abnormal findings in urine: Secondary | ICD-10-CM | POA: Diagnosis not present

## 2017-12-05 DIAGNOSIS — N2589 Other disorders resulting from impaired renal tubular function: Secondary | ICD-10-CM | POA: Diagnosis not present

## 2017-12-05 DIAGNOSIS — N2581 Secondary hyperparathyroidism of renal origin: Secondary | ICD-10-CM | POA: Diagnosis not present

## 2017-12-05 DIAGNOSIS — D509 Iron deficiency anemia, unspecified: Secondary | ICD-10-CM | POA: Diagnosis not present

## 2017-12-05 NOTE — Progress Notes (Signed)
I agree with the above plan 

## 2017-12-05 NOTE — Progress Notes (Signed)
   Subjective:    Patient ID: Jared [name], male    DOB: [birth year], 62 y.o.   MRN: [id number]  HPI Chief Complaint  Patient presents with  . Hospitalization Follow-up    needs to see ENT,    He is here for hospital discharge follow-up.  Admitted to the hospital on 11/24/2017 with encephalopathy and discharged on 12/01/2017.  Reports he is feeling much better but not quite yet to baseline. He was seen by his neurologist yesterday.  States he is here today to get an ENT referral due to left ear tinnitus.  This has been ongoing and becoming more bothersome for him. He declines any lab work today and states he has an appointment tomorrow for labs at his nephrologist.  Per hospital discharge summary he was to have home health physical therapy.  The patient declines this.  Denies fever, chills, dizziness, chest pain, palpitations, shortness of breath, abdominal pain, N/V/D, urinary symptoms, LE edema.    Review of Systems Pertinent positives and negatives in the history of present illness.     Objective:   Physical Exam BP 120/72 (BP Location: Right Arm, Patient Position: Sitting)   Pulse 66   Ht 5' 7.5" (1.715 m)   Wt 156 lb 9.6 oz (71 kg)   SpO2 97%   BMI 24.17 kg/m   Alert and in no distress. Left TM has an abnormal appearance, dull with mild erythema, right TM normal and canals are normal. Pharyngeal area is normal. Neck is supple without adenopathy or thyromegaly. Cardiac exam shows a regular sinus rhythm without murmurs or gallops. Lungs are clear to auscultation. Extremities without edema.       Assessment & Plan:  Tinnitus of left ear - Plan: Ambulatory referral to ENT  Hospital discharge follow-up  Essential hypertension  Reviewed hospital discharge summary, labs, imaging results. Called Dr. Sanda Klein office to confirm that he has an appointment tomorrow for labs per hospital discharge summary.  Referral made to ENT for evaluation of left ear tinnitus per discharge  summary.  He has upcoming appointments with neurology, nephrology and urology.  [place] that he does not want home health PT.  Will see him back as needed.

## 2017-12-06 DIAGNOSIS — K769 Liver disease, unspecified: Secondary | ICD-10-CM | POA: Diagnosis not present

## 2017-12-06 DIAGNOSIS — N2589 Other disorders resulting from impaired renal tubular function: Secondary | ICD-10-CM | POA: Diagnosis not present

## 2017-12-06 DIAGNOSIS — N186 End stage renal disease: Secondary | ICD-10-CM | POA: Diagnosis not present

## 2017-12-06 DIAGNOSIS — N2581 Secondary hyperparathyroidism of renal origin: Secondary | ICD-10-CM | POA: Diagnosis not present

## 2017-12-06 DIAGNOSIS — E1129 Type 2 diabetes mellitus with other diabetic kidney complication: Secondary | ICD-10-CM | POA: Diagnosis not present

## 2017-12-06 DIAGNOSIS — R82998 Other abnormal findings in urine: Secondary | ICD-10-CM | POA: Diagnosis not present

## 2017-12-06 DIAGNOSIS — E7849 Other hyperlipidemia: Secondary | ICD-10-CM | POA: Diagnosis not present

## 2017-12-06 DIAGNOSIS — D509 Iron deficiency anemia, unspecified: Secondary | ICD-10-CM | POA: Diagnosis not present

## 2017-12-07 DIAGNOSIS — N2589 Other disorders resulting from impaired renal tubular function: Secondary | ICD-10-CM | POA: Diagnosis not present

## 2017-12-07 DIAGNOSIS — N186 End stage renal disease: Secondary | ICD-10-CM | POA: Diagnosis not present

## 2017-12-07 DIAGNOSIS — K769 Liver disease, unspecified: Secondary | ICD-10-CM | POA: Diagnosis not present

## 2017-12-07 DIAGNOSIS — N2581 Secondary hyperparathyroidism of renal origin: Secondary | ICD-10-CM | POA: Diagnosis not present

## 2017-12-07 DIAGNOSIS — D509 Iron deficiency anemia, unspecified: Secondary | ICD-10-CM | POA: Diagnosis not present

## 2017-12-07 DIAGNOSIS — R82998 Other abnormal findings in urine: Secondary | ICD-10-CM | POA: Diagnosis not present

## 2017-12-08 DIAGNOSIS — D509 Iron deficiency anemia, unspecified: Secondary | ICD-10-CM | POA: Diagnosis not present

## 2017-12-08 DIAGNOSIS — K769 Liver disease, unspecified: Secondary | ICD-10-CM | POA: Diagnosis not present

## 2017-12-08 DIAGNOSIS — N2581 Secondary hyperparathyroidism of renal origin: Secondary | ICD-10-CM | POA: Diagnosis not present

## 2017-12-08 DIAGNOSIS — N2589 Other disorders resulting from impaired renal tubular function: Secondary | ICD-10-CM | POA: Diagnosis not present

## 2017-12-08 DIAGNOSIS — R82998 Other abnormal findings in urine: Secondary | ICD-10-CM | POA: Diagnosis not present

## 2017-12-08 DIAGNOSIS — N186 End stage renal disease: Secondary | ICD-10-CM | POA: Diagnosis not present

## 2017-12-09 DIAGNOSIS — K769 Liver disease, unspecified: Secondary | ICD-10-CM | POA: Diagnosis not present

## 2017-12-09 DIAGNOSIS — N2589 Other disorders resulting from impaired renal tubular function: Secondary | ICD-10-CM | POA: Diagnosis not present

## 2017-12-09 DIAGNOSIS — N186 End stage renal disease: Secondary | ICD-10-CM | POA: Diagnosis not present

## 2017-12-09 DIAGNOSIS — N2581 Secondary hyperparathyroidism of renal origin: Secondary | ICD-10-CM | POA: Diagnosis not present

## 2017-12-09 DIAGNOSIS — D509 Iron deficiency anemia, unspecified: Secondary | ICD-10-CM | POA: Diagnosis not present

## 2017-12-09 DIAGNOSIS — R82998 Other abnormal findings in urine: Secondary | ICD-10-CM | POA: Diagnosis not present

## 2017-12-10 DIAGNOSIS — N186 End stage renal disease: Secondary | ICD-10-CM | POA: Diagnosis not present

## 2017-12-10 DIAGNOSIS — D509 Iron deficiency anemia, unspecified: Secondary | ICD-10-CM | POA: Diagnosis not present

## 2017-12-10 DIAGNOSIS — K769 Liver disease, unspecified: Secondary | ICD-10-CM | POA: Diagnosis not present

## 2017-12-10 DIAGNOSIS — R82998 Other abnormal findings in urine: Secondary | ICD-10-CM | POA: Diagnosis not present

## 2017-12-10 DIAGNOSIS — N2589 Other disorders resulting from impaired renal tubular function: Secondary | ICD-10-CM | POA: Diagnosis not present

## 2017-12-10 DIAGNOSIS — N2581 Secondary hyperparathyroidism of renal origin: Secondary | ICD-10-CM | POA: Diagnosis not present

## 2017-12-11 DIAGNOSIS — D509 Iron deficiency anemia, unspecified: Secondary | ICD-10-CM | POA: Diagnosis not present

## 2017-12-11 DIAGNOSIS — R82998 Other abnormal findings in urine: Secondary | ICD-10-CM | POA: Diagnosis not present

## 2017-12-11 DIAGNOSIS — N186 End stage renal disease: Secondary | ICD-10-CM | POA: Diagnosis not present

## 2017-12-11 DIAGNOSIS — N2589 Other disorders resulting from impaired renal tubular function: Secondary | ICD-10-CM | POA: Diagnosis not present

## 2017-12-11 DIAGNOSIS — N2581 Secondary hyperparathyroidism of renal origin: Secondary | ICD-10-CM | POA: Diagnosis not present

## 2017-12-11 DIAGNOSIS — K769 Liver disease, unspecified: Secondary | ICD-10-CM | POA: Diagnosis not present

## 2017-12-12 DIAGNOSIS — N2581 Secondary hyperparathyroidism of renal origin: Secondary | ICD-10-CM | POA: Diagnosis not present

## 2017-12-12 DIAGNOSIS — R82998 Other abnormal findings in urine: Secondary | ICD-10-CM | POA: Diagnosis not present

## 2017-12-12 DIAGNOSIS — K769 Liver disease, unspecified: Secondary | ICD-10-CM | POA: Diagnosis not present

## 2017-12-12 DIAGNOSIS — N2589 Other disorders resulting from impaired renal tubular function: Secondary | ICD-10-CM | POA: Diagnosis not present

## 2017-12-12 DIAGNOSIS — D509 Iron deficiency anemia, unspecified: Secondary | ICD-10-CM | POA: Diagnosis not present

## 2017-12-12 DIAGNOSIS — N186 End stage renal disease: Secondary | ICD-10-CM | POA: Diagnosis not present

## 2017-12-13 DIAGNOSIS — K769 Liver disease, unspecified: Secondary | ICD-10-CM | POA: Diagnosis not present

## 2017-12-13 DIAGNOSIS — R82998 Other abnormal findings in urine: Secondary | ICD-10-CM | POA: Diagnosis not present

## 2017-12-13 DIAGNOSIS — N186 End stage renal disease: Secondary | ICD-10-CM | POA: Diagnosis not present

## 2017-12-13 DIAGNOSIS — N2581 Secondary hyperparathyroidism of renal origin: Secondary | ICD-10-CM | POA: Diagnosis not present

## 2017-12-13 DIAGNOSIS — N2589 Other disorders resulting from impaired renal tubular function: Secondary | ICD-10-CM | POA: Diagnosis not present

## 2017-12-13 DIAGNOSIS — D509 Iron deficiency anemia, unspecified: Secondary | ICD-10-CM | POA: Diagnosis not present

## 2017-12-14 DIAGNOSIS — D509 Iron deficiency anemia, unspecified: Secondary | ICD-10-CM | POA: Diagnosis not present

## 2017-12-14 DIAGNOSIS — N2581 Secondary hyperparathyroidism of renal origin: Secondary | ICD-10-CM | POA: Diagnosis not present

## 2017-12-14 DIAGNOSIS — N2589 Other disorders resulting from impaired renal tubular function: Secondary | ICD-10-CM | POA: Diagnosis not present

## 2017-12-14 DIAGNOSIS — K769 Liver disease, unspecified: Secondary | ICD-10-CM | POA: Diagnosis not present

## 2017-12-14 DIAGNOSIS — R82998 Other abnormal findings in urine: Secondary | ICD-10-CM | POA: Diagnosis not present

## 2017-12-14 DIAGNOSIS — N186 End stage renal disease: Secondary | ICD-10-CM | POA: Diagnosis not present

## 2017-12-15 DIAGNOSIS — K769 Liver disease, unspecified: Secondary | ICD-10-CM | POA: Diagnosis not present

## 2017-12-15 DIAGNOSIS — D509 Iron deficiency anemia, unspecified: Secondary | ICD-10-CM | POA: Diagnosis not present

## 2017-12-15 DIAGNOSIS — R82998 Other abnormal findings in urine: Secondary | ICD-10-CM | POA: Diagnosis not present

## 2017-12-15 DIAGNOSIS — N186 End stage renal disease: Secondary | ICD-10-CM | POA: Diagnosis not present

## 2017-12-15 DIAGNOSIS — N2589 Other disorders resulting from impaired renal tubular function: Secondary | ICD-10-CM | POA: Diagnosis not present

## 2017-12-15 DIAGNOSIS — N2581 Secondary hyperparathyroidism of renal origin: Secondary | ICD-10-CM | POA: Diagnosis not present

## 2017-12-16 DIAGNOSIS — N186 End stage renal disease: Secondary | ICD-10-CM | POA: Diagnosis not present

## 2017-12-16 DIAGNOSIS — R82998 Other abnormal findings in urine: Secondary | ICD-10-CM | POA: Diagnosis not present

## 2017-12-16 DIAGNOSIS — N2581 Secondary hyperparathyroidism of renal origin: Secondary | ICD-10-CM | POA: Diagnosis not present

## 2017-12-16 DIAGNOSIS — N2589 Other disorders resulting from impaired renal tubular function: Secondary | ICD-10-CM | POA: Diagnosis not present

## 2017-12-16 DIAGNOSIS — K769 Liver disease, unspecified: Secondary | ICD-10-CM | POA: Diagnosis not present

## 2017-12-16 DIAGNOSIS — D509 Iron deficiency anemia, unspecified: Secondary | ICD-10-CM | POA: Diagnosis not present

## 2017-12-17 DIAGNOSIS — R82998 Other abnormal findings in urine: Secondary | ICD-10-CM | POA: Diagnosis not present

## 2017-12-17 DIAGNOSIS — D509 Iron deficiency anemia, unspecified: Secondary | ICD-10-CM | POA: Diagnosis not present

## 2017-12-17 DIAGNOSIS — N2581 Secondary hyperparathyroidism of renal origin: Secondary | ICD-10-CM | POA: Diagnosis not present

## 2017-12-17 DIAGNOSIS — N2589 Other disorders resulting from impaired renal tubular function: Secondary | ICD-10-CM | POA: Diagnosis not present

## 2017-12-17 DIAGNOSIS — N186 End stage renal disease: Secondary | ICD-10-CM | POA: Diagnosis not present

## 2017-12-17 DIAGNOSIS — K769 Liver disease, unspecified: Secondary | ICD-10-CM | POA: Diagnosis not present

## 2017-12-18 DIAGNOSIS — N186 End stage renal disease: Secondary | ICD-10-CM | POA: Diagnosis not present

## 2017-12-18 DIAGNOSIS — R82998 Other abnormal findings in urine: Secondary | ICD-10-CM | POA: Diagnosis not present

## 2017-12-18 DIAGNOSIS — N2589 Other disorders resulting from impaired renal tubular function: Secondary | ICD-10-CM | POA: Diagnosis not present

## 2017-12-18 DIAGNOSIS — K769 Liver disease, unspecified: Secondary | ICD-10-CM | POA: Diagnosis not present

## 2017-12-18 DIAGNOSIS — N2581 Secondary hyperparathyroidism of renal origin: Secondary | ICD-10-CM | POA: Diagnosis not present

## 2017-12-18 DIAGNOSIS — D509 Iron deficiency anemia, unspecified: Secondary | ICD-10-CM | POA: Diagnosis not present

## 2017-12-19 DIAGNOSIS — N2581 Secondary hyperparathyroidism of renal origin: Secondary | ICD-10-CM | POA: Diagnosis not present

## 2017-12-19 DIAGNOSIS — R82998 Other abnormal findings in urine: Secondary | ICD-10-CM | POA: Diagnosis not present

## 2017-12-19 DIAGNOSIS — N186 End stage renal disease: Secondary | ICD-10-CM | POA: Diagnosis not present

## 2017-12-19 DIAGNOSIS — N2589 Other disorders resulting from impaired renal tubular function: Secondary | ICD-10-CM | POA: Diagnosis not present

## 2017-12-19 DIAGNOSIS — K769 Liver disease, unspecified: Secondary | ICD-10-CM | POA: Diagnosis not present

## 2017-12-19 DIAGNOSIS — D509 Iron deficiency anemia, unspecified: Secondary | ICD-10-CM | POA: Diagnosis not present

## 2017-12-20 DIAGNOSIS — N186 End stage renal disease: Secondary | ICD-10-CM | POA: Diagnosis not present

## 2017-12-20 DIAGNOSIS — D509 Iron deficiency anemia, unspecified: Secondary | ICD-10-CM | POA: Diagnosis not present

## 2017-12-20 DIAGNOSIS — N2589 Other disorders resulting from impaired renal tubular function: Secondary | ICD-10-CM | POA: Diagnosis not present

## 2017-12-20 DIAGNOSIS — K769 Liver disease, unspecified: Secondary | ICD-10-CM | POA: Diagnosis not present

## 2017-12-20 DIAGNOSIS — N2581 Secondary hyperparathyroidism of renal origin: Secondary | ICD-10-CM | POA: Diagnosis not present

## 2017-12-20 DIAGNOSIS — R82998 Other abnormal findings in urine: Secondary | ICD-10-CM | POA: Diagnosis not present

## 2017-12-21 DIAGNOSIS — N186 End stage renal disease: Secondary | ICD-10-CM | POA: Diagnosis not present

## 2017-12-21 DIAGNOSIS — R82998 Other abnormal findings in urine: Secondary | ICD-10-CM | POA: Diagnosis not present

## 2017-12-21 DIAGNOSIS — N2581 Secondary hyperparathyroidism of renal origin: Secondary | ICD-10-CM | POA: Diagnosis not present

## 2017-12-21 DIAGNOSIS — K769 Liver disease, unspecified: Secondary | ICD-10-CM | POA: Diagnosis not present

## 2017-12-21 DIAGNOSIS — D509 Iron deficiency anemia, unspecified: Secondary | ICD-10-CM | POA: Diagnosis not present

## 2017-12-21 DIAGNOSIS — N2589 Other disorders resulting from impaired renal tubular function: Secondary | ICD-10-CM | POA: Diagnosis not present

## 2017-12-22 DIAGNOSIS — N186 End stage renal disease: Secondary | ICD-10-CM | POA: Diagnosis not present

## 2017-12-22 DIAGNOSIS — K769 Liver disease, unspecified: Secondary | ICD-10-CM | POA: Diagnosis not present

## 2017-12-22 DIAGNOSIS — N2581 Secondary hyperparathyroidism of renal origin: Secondary | ICD-10-CM | POA: Diagnosis not present

## 2017-12-22 DIAGNOSIS — D509 Iron deficiency anemia, unspecified: Secondary | ICD-10-CM | POA: Diagnosis not present

## 2017-12-22 DIAGNOSIS — R82998 Other abnormal findings in urine: Secondary | ICD-10-CM | POA: Diagnosis not present

## 2017-12-22 DIAGNOSIS — N2589 Other disorders resulting from impaired renal tubular function: Secondary | ICD-10-CM | POA: Diagnosis not present

## 2017-12-23 DIAGNOSIS — D509 Iron deficiency anemia, unspecified: Secondary | ICD-10-CM | POA: Diagnosis not present

## 2017-12-23 DIAGNOSIS — N2589 Other disorders resulting from impaired renal tubular function: Secondary | ICD-10-CM | POA: Diagnosis not present

## 2017-12-23 DIAGNOSIS — K769 Liver disease, unspecified: Secondary | ICD-10-CM | POA: Diagnosis not present

## 2017-12-23 DIAGNOSIS — R82998 Other abnormal findings in urine: Secondary | ICD-10-CM | POA: Diagnosis not present

## 2017-12-23 DIAGNOSIS — N2581 Secondary hyperparathyroidism of renal origin: Secondary | ICD-10-CM | POA: Diagnosis not present

## 2017-12-23 DIAGNOSIS — N186 End stage renal disease: Secondary | ICD-10-CM | POA: Diagnosis not present

## 2017-12-24 ENCOUNTER — Telehealth: Payer: Self-pay | Admitting: Family Medicine

## 2017-12-24 DIAGNOSIS — N186 End stage renal disease: Secondary | ICD-10-CM | POA: Diagnosis not present

## 2017-12-24 DIAGNOSIS — K769 Liver disease, unspecified: Secondary | ICD-10-CM | POA: Diagnosis not present

## 2017-12-24 DIAGNOSIS — N2581 Secondary hyperparathyroidism of renal origin: Secondary | ICD-10-CM | POA: Diagnosis not present

## 2017-12-24 DIAGNOSIS — D509 Iron deficiency anemia, unspecified: Secondary | ICD-10-CM | POA: Diagnosis not present

## 2017-12-24 DIAGNOSIS — N2589 Other disorders resulting from impaired renal tubular function: Secondary | ICD-10-CM | POA: Diagnosis not present

## 2017-12-24 DIAGNOSIS — R82998 Other abnormal findings in urine: Secondary | ICD-10-CM | POA: Diagnosis not present

## 2017-12-24 NOTE — Telephone Encounter (Signed)
Left message for pt to call me back. I have resent a referral over to [facility] ENT for pt as I was not sure it was done already or not and I can not find anything on it as I was not here when it was put in

## 2017-12-24 NOTE — Telephone Encounter (Signed)
Patient called and said waiting to hear from his ENT referral.  I can see it in the system but not complete. Please follow up on referral and call pt with info   Pt ph [phone] , you may also talk with Elizbeth Squires if she answers.

## 2017-12-25 DIAGNOSIS — D509 Iron deficiency anemia, unspecified: Secondary | ICD-10-CM | POA: Diagnosis not present

## 2017-12-25 DIAGNOSIS — N2581 Secondary hyperparathyroidism of renal origin: Secondary | ICD-10-CM | POA: Diagnosis not present

## 2017-12-25 DIAGNOSIS — R82998 Other abnormal findings in urine: Secondary | ICD-10-CM | POA: Diagnosis not present

## 2017-12-25 DIAGNOSIS — N186 End stage renal disease: Secondary | ICD-10-CM | POA: Diagnosis not present

## 2017-12-25 DIAGNOSIS — K769 Liver disease, unspecified: Secondary | ICD-10-CM | POA: Diagnosis not present

## 2017-12-25 DIAGNOSIS — N2589 Other disorders resulting from impaired renal tubular function: Secondary | ICD-10-CM | POA: Diagnosis not present

## 2017-12-25 NOTE — Telephone Encounter (Signed)
Spoke to patient and notified him that referral will be refaxed over to [facility] ENT today

## 2017-12-26 DIAGNOSIS — R82998 Other abnormal findings in urine: Secondary | ICD-10-CM | POA: Diagnosis not present

## 2017-12-26 DIAGNOSIS — N2589 Other disorders resulting from impaired renal tubular function: Secondary | ICD-10-CM | POA: Diagnosis not present

## 2017-12-26 DIAGNOSIS — N2581 Secondary hyperparathyroidism of renal origin: Secondary | ICD-10-CM | POA: Diagnosis not present

## 2017-12-26 DIAGNOSIS — K769 Liver disease, unspecified: Secondary | ICD-10-CM | POA: Diagnosis not present

## 2017-12-26 DIAGNOSIS — D509 Iron deficiency anemia, unspecified: Secondary | ICD-10-CM | POA: Diagnosis not present

## 2017-12-26 DIAGNOSIS — N186 End stage renal disease: Secondary | ICD-10-CM | POA: Diagnosis not present

## 2017-12-27 DIAGNOSIS — K769 Liver disease, unspecified: Secondary | ICD-10-CM | POA: Diagnosis not present

## 2017-12-27 DIAGNOSIS — N2589 Other disorders resulting from impaired renal tubular function: Secondary | ICD-10-CM | POA: Diagnosis not present

## 2017-12-27 DIAGNOSIS — R82998 Other abnormal findings in urine: Secondary | ICD-10-CM | POA: Diagnosis not present

## 2017-12-27 DIAGNOSIS — N186 End stage renal disease: Secondary | ICD-10-CM | POA: Diagnosis not present

## 2017-12-27 DIAGNOSIS — Z992 Dependence on renal dialysis: Secondary | ICD-10-CM | POA: Diagnosis not present

## 2017-12-27 DIAGNOSIS — N2581 Secondary hyperparathyroidism of renal origin: Secondary | ICD-10-CM | POA: Diagnosis not present

## 2017-12-27 DIAGNOSIS — D509 Iron deficiency anemia, unspecified: Secondary | ICD-10-CM | POA: Diagnosis not present

## 2017-12-27 DIAGNOSIS — I129 Hypertensive chronic kidney disease with stage 1 through stage 4 chronic kidney disease, or unspecified chronic kidney disease: Secondary | ICD-10-CM | POA: Diagnosis not present

## 2017-12-28 DIAGNOSIS — E440 Moderate protein-calorie malnutrition: Secondary | ICD-10-CM | POA: Diagnosis not present

## 2017-12-28 DIAGNOSIS — K650 Generalized (acute) peritonitis: Secondary | ICD-10-CM | POA: Diagnosis not present

## 2017-12-28 DIAGNOSIS — D631 Anemia in chronic kidney disease: Secondary | ICD-10-CM | POA: Diagnosis not present

## 2017-12-28 DIAGNOSIS — N186 End stage renal disease: Secondary | ICD-10-CM | POA: Diagnosis not present

## 2017-12-28 DIAGNOSIS — Z992 Dependence on renal dialysis: Secondary | ICD-10-CM | POA: Diagnosis not present

## 2017-12-28 DIAGNOSIS — K769 Liver disease, unspecified: Secondary | ICD-10-CM | POA: Diagnosis not present

## 2017-12-28 DIAGNOSIS — N2581 Secondary hyperparathyroidism of renal origin: Secondary | ICD-10-CM | POA: Diagnosis not present

## 2017-12-28 DIAGNOSIS — Z79899 Other long term (current) drug therapy: Secondary | ICD-10-CM | POA: Diagnosis not present

## 2017-12-28 DIAGNOSIS — Z23 Encounter for immunization: Secondary | ICD-10-CM | POA: Diagnosis not present

## 2017-12-28 DIAGNOSIS — I129 Hypertensive chronic kidney disease with stage 1 through stage 4 chronic kidney disease, or unspecified chronic kidney disease: Secondary | ICD-10-CM | POA: Diagnosis not present

## 2017-12-28 DIAGNOSIS — D509 Iron deficiency anemia, unspecified: Secondary | ICD-10-CM | POA: Diagnosis not present

## 2017-12-28 DIAGNOSIS — Z5189 Encounter for other specified aftercare: Secondary | ICD-10-CM | POA: Diagnosis not present

## 2017-12-29 DIAGNOSIS — N2581 Secondary hyperparathyroidism of renal origin: Secondary | ICD-10-CM | POA: Diagnosis not present

## 2017-12-29 DIAGNOSIS — D631 Anemia in chronic kidney disease: Secondary | ICD-10-CM | POA: Diagnosis not present

## 2017-12-29 DIAGNOSIS — K650 Generalized (acute) peritonitis: Secondary | ICD-10-CM | POA: Diagnosis not present

## 2017-12-29 DIAGNOSIS — K769 Liver disease, unspecified: Secondary | ICD-10-CM | POA: Diagnosis not present

## 2017-12-29 DIAGNOSIS — E440 Moderate protein-calorie malnutrition: Secondary | ICD-10-CM | POA: Diagnosis not present

## 2017-12-29 DIAGNOSIS — N186 End stage renal disease: Secondary | ICD-10-CM | POA: Diagnosis not present

## 2017-12-30 DIAGNOSIS — N186 End stage renal disease: Secondary | ICD-10-CM | POA: Diagnosis not present

## 2017-12-30 DIAGNOSIS — D631 Anemia in chronic kidney disease: Secondary | ICD-10-CM | POA: Diagnosis not present

## 2017-12-30 DIAGNOSIS — K650 Generalized (acute) peritonitis: Secondary | ICD-10-CM | POA: Diagnosis not present

## 2017-12-30 DIAGNOSIS — N2581 Secondary hyperparathyroidism of renal origin: Secondary | ICD-10-CM | POA: Diagnosis not present

## 2017-12-30 DIAGNOSIS — E440 Moderate protein-calorie malnutrition: Secondary | ICD-10-CM | POA: Diagnosis not present

## 2017-12-30 DIAGNOSIS — K769 Liver disease, unspecified: Secondary | ICD-10-CM | POA: Diagnosis not present

## 2017-12-31 DIAGNOSIS — K650 Generalized (acute) peritonitis: Secondary | ICD-10-CM | POA: Diagnosis not present

## 2017-12-31 DIAGNOSIS — E440 Moderate protein-calorie malnutrition: Secondary | ICD-10-CM | POA: Diagnosis not present

## 2017-12-31 DIAGNOSIS — R82998 Other abnormal findings in urine: Secondary | ICD-10-CM | POA: Diagnosis not present

## 2017-12-31 DIAGNOSIS — K769 Liver disease, unspecified: Secondary | ICD-10-CM | POA: Diagnosis not present

## 2017-12-31 DIAGNOSIS — D631 Anemia in chronic kidney disease: Secondary | ICD-10-CM | POA: Diagnosis not present

## 2017-12-31 DIAGNOSIS — N186 End stage renal disease: Secondary | ICD-10-CM | POA: Diagnosis not present

## 2017-12-31 DIAGNOSIS — N2581 Secondary hyperparathyroidism of renal origin: Secondary | ICD-10-CM | POA: Diagnosis not present

## 2018-01-01 DIAGNOSIS — N186 End stage renal disease: Secondary | ICD-10-CM | POA: Diagnosis not present

## 2018-01-01 DIAGNOSIS — D631 Anemia in chronic kidney disease: Secondary | ICD-10-CM | POA: Diagnosis not present

## 2018-01-01 DIAGNOSIS — K650 Generalized (acute) peritonitis: Secondary | ICD-10-CM | POA: Diagnosis not present

## 2018-01-01 DIAGNOSIS — K769 Liver disease, unspecified: Secondary | ICD-10-CM | POA: Diagnosis not present

## 2018-01-01 DIAGNOSIS — N2581 Secondary hyperparathyroidism of renal origin: Secondary | ICD-10-CM | POA: Diagnosis not present

## 2018-01-01 DIAGNOSIS — E440 Moderate protein-calorie malnutrition: Secondary | ICD-10-CM | POA: Diagnosis not present

## 2018-01-02 DIAGNOSIS — E440 Moderate protein-calorie malnutrition: Secondary | ICD-10-CM | POA: Diagnosis not present

## 2018-01-02 DIAGNOSIS — K769 Liver disease, unspecified: Secondary | ICD-10-CM | POA: Diagnosis not present

## 2018-01-02 DIAGNOSIS — K650 Generalized (acute) peritonitis: Secondary | ICD-10-CM | POA: Diagnosis not present

## 2018-01-02 DIAGNOSIS — N2581 Secondary hyperparathyroidism of renal origin: Secondary | ICD-10-CM | POA: Diagnosis not present

## 2018-01-02 DIAGNOSIS — D631 Anemia in chronic kidney disease: Secondary | ICD-10-CM | POA: Diagnosis not present

## 2018-01-02 DIAGNOSIS — N186 End stage renal disease: Secondary | ICD-10-CM | POA: Diagnosis not present

## 2018-01-03 DIAGNOSIS — E440 Moderate protein-calorie malnutrition: Secondary | ICD-10-CM | POA: Diagnosis not present

## 2018-01-03 DIAGNOSIS — D631 Anemia in chronic kidney disease: Secondary | ICD-10-CM | POA: Diagnosis not present

## 2018-01-03 DIAGNOSIS — R42 Dizziness and giddiness: Secondary | ICD-10-CM | POA: Diagnosis not present

## 2018-01-03 DIAGNOSIS — N186 End stage renal disease: Secondary | ICD-10-CM | POA: Diagnosis not present

## 2018-01-03 DIAGNOSIS — K769 Liver disease, unspecified: Secondary | ICD-10-CM | POA: Diagnosis not present

## 2018-01-03 DIAGNOSIS — H6123 Impacted cerumen, bilateral: Secondary | ICD-10-CM | POA: Diagnosis not present

## 2018-01-03 DIAGNOSIS — H9313 Tinnitus, bilateral: Secondary | ICD-10-CM | POA: Diagnosis not present

## 2018-01-03 DIAGNOSIS — N2581 Secondary hyperparathyroidism of renal origin: Secondary | ICD-10-CM | POA: Diagnosis not present

## 2018-01-03 DIAGNOSIS — K650 Generalized (acute) peritonitis: Secondary | ICD-10-CM | POA: Diagnosis not present

## 2018-01-04 DIAGNOSIS — D631 Anemia in chronic kidney disease: Secondary | ICD-10-CM | POA: Diagnosis not present

## 2018-01-04 DIAGNOSIS — K650 Generalized (acute) peritonitis: Secondary | ICD-10-CM | POA: Diagnosis not present

## 2018-01-04 DIAGNOSIS — K769 Liver disease, unspecified: Secondary | ICD-10-CM | POA: Diagnosis not present

## 2018-01-04 DIAGNOSIS — N186 End stage renal disease: Secondary | ICD-10-CM | POA: Diagnosis not present

## 2018-01-04 DIAGNOSIS — N2581 Secondary hyperparathyroidism of renal origin: Secondary | ICD-10-CM | POA: Diagnosis not present

## 2018-01-04 DIAGNOSIS — E440 Moderate protein-calorie malnutrition: Secondary | ICD-10-CM | POA: Diagnosis not present

## 2018-01-05 DIAGNOSIS — E440 Moderate protein-calorie malnutrition: Secondary | ICD-10-CM | POA: Diagnosis not present

## 2018-01-05 DIAGNOSIS — K650 Generalized (acute) peritonitis: Secondary | ICD-10-CM | POA: Diagnosis not present

## 2018-01-05 DIAGNOSIS — D631 Anemia in chronic kidney disease: Secondary | ICD-10-CM | POA: Diagnosis not present

## 2018-01-05 DIAGNOSIS — N2581 Secondary hyperparathyroidism of renal origin: Secondary | ICD-10-CM | POA: Diagnosis not present

## 2018-01-05 DIAGNOSIS — N186 End stage renal disease: Secondary | ICD-10-CM | POA: Diagnosis not present

## 2018-01-05 DIAGNOSIS — K769 Liver disease, unspecified: Secondary | ICD-10-CM | POA: Diagnosis not present

## 2018-01-06 DIAGNOSIS — N2581 Secondary hyperparathyroidism of renal origin: Secondary | ICD-10-CM | POA: Diagnosis not present

## 2018-01-06 DIAGNOSIS — N186 End stage renal disease: Secondary | ICD-10-CM | POA: Diagnosis not present

## 2018-01-06 DIAGNOSIS — D631 Anemia in chronic kidney disease: Secondary | ICD-10-CM | POA: Diagnosis not present

## 2018-01-06 DIAGNOSIS — K769 Liver disease, unspecified: Secondary | ICD-10-CM | POA: Diagnosis not present

## 2018-01-06 DIAGNOSIS — E440 Moderate protein-calorie malnutrition: Secondary | ICD-10-CM | POA: Diagnosis not present

## 2018-01-06 DIAGNOSIS — K650 Generalized (acute) peritonitis: Secondary | ICD-10-CM | POA: Diagnosis not present

## 2018-01-07 DIAGNOSIS — K650 Generalized (acute) peritonitis: Secondary | ICD-10-CM | POA: Diagnosis not present

## 2018-01-07 DIAGNOSIS — D631 Anemia in chronic kidney disease: Secondary | ICD-10-CM | POA: Diagnosis not present

## 2018-01-07 DIAGNOSIS — N2581 Secondary hyperparathyroidism of renal origin: Secondary | ICD-10-CM | POA: Diagnosis not present

## 2018-01-07 DIAGNOSIS — K769 Liver disease, unspecified: Secondary | ICD-10-CM | POA: Diagnosis not present

## 2018-01-07 DIAGNOSIS — N186 End stage renal disease: Secondary | ICD-10-CM | POA: Diagnosis not present

## 2018-01-07 DIAGNOSIS — E440 Moderate protein-calorie malnutrition: Secondary | ICD-10-CM | POA: Diagnosis not present

## 2018-01-08 ENCOUNTER — Ambulatory Visit (INDEPENDENT_AMBULATORY_CARE_PROVIDER_SITE_OTHER): Payer: Medicare Other | Admitting: Family Medicine

## 2018-01-08 ENCOUNTER — Encounter: Payer: Self-pay | Admitting: Family Medicine

## 2018-01-08 VITALS — BP 120/72 | HR 77 | Temp 98.2°F | Resp 16 | Wt 166.8 lb

## 2018-01-08 DIAGNOSIS — N2581 Secondary hyperparathyroidism of renal origin: Secondary | ICD-10-CM | POA: Diagnosis not present

## 2018-01-08 DIAGNOSIS — K769 Liver disease, unspecified: Secondary | ICD-10-CM | POA: Diagnosis not present

## 2018-01-08 DIAGNOSIS — R1030 Lower abdominal pain, unspecified: Secondary | ICD-10-CM

## 2018-01-08 DIAGNOSIS — K650 Generalized (acute) peritonitis: Secondary | ICD-10-CM | POA: Diagnosis not present

## 2018-01-08 DIAGNOSIS — D631 Anemia in chronic kidney disease: Secondary | ICD-10-CM | POA: Diagnosis not present

## 2018-01-08 DIAGNOSIS — E440 Moderate protein-calorie malnutrition: Secondary | ICD-10-CM | POA: Diagnosis not present

## 2018-01-08 DIAGNOSIS — K439 Ventral hernia without obstruction or gangrene: Secondary | ICD-10-CM | POA: Diagnosis not present

## 2018-01-08 DIAGNOSIS — N186 End stage renal disease: Secondary | ICD-10-CM | POA: Diagnosis not present

## 2018-01-08 NOTE — Progress Notes (Signed)
   Subjective:    Patient ID: Jared [name], male    DOB: 1955-12-08, 62 y.o.   MRN: [id number]  HPI Chief Complaint  Patient presents with  . discomfort    discomfort in groin with swelling for the last couple weeks, but swelling not going down last couple days with a dull ache, pain in stomach   Complains of a 2 week history of intermittent dull pain and large bulge to his left lower abdomen. States he was straining to have a bowel movement and felt a "give away" in this area. He does have a peritoneal dialysis catheter in place above the area of concern. Denies any issues with this.  Denies fever, chills, fatigue, chest pain, shortness of breath, nausea, vomiting, diarrhea or constipation.  Reports normal daily bowel movements. Denies blood or pus.   Reviewed allergies, medications, past medical, surgical, family, and social history.   Review of Systems Pertinent positives and negatives in the history of present illness.     Objective:   Physical Exam  Constitutional: He is oriented to person, place, and time. He appears well-developed and well-nourished. No distress.  Cardiovascular: Normal rate, regular rhythm and intact distal pulses.  Pulmonary/Chest: Effort normal and breath sounds normal.  Abdominal: Soft. Bowel sounds are normal. There is no hepatosplenomegaly. There is tenderness. There is no rigidity, no rebound, no guarding, no CVA tenderness, no tenderness at McBurney's point and negative Murphy's sign. A hernia is present.    Peritoneal dialysis catheter with a clean, dry and intact dressing in place. No erythema, edema or tenderness.   Neurological: He is alert and oriented to person, place, and time. He has normal strength.  Skin: Skin is warm and dry. No pallor.   BP 120/72   Pulse 77   Temp 98.2 F (36.8 C) (Oral)   Resp 16   Wt 166 lb 12.8 oz (75.7 kg)   BMI 25.74 kg/m       Assessment & Plan:  Hernia of abdominal wall - Plan: Ambulatory referral to  General Surgery  Lower abdominal pain  Referral to surgery.  Discussed that he does have a large hernia and advised to avoid lifting anything over 10 lbs or straining. No sign of any infectious process, strangulation or obstruction at this time. Strict precautions that if he is unable to reduce the hernia by laying flat, develops severe pain, fever or any other worrisome symptoms that he will go to the ED or call 911. He verbalized understanding.

## 2018-01-08 NOTE — Patient Instructions (Signed)
Avoid lifting anything over 10lbs and avoid straining.  If you notice the bulging area not reducing when laying flat, or severe pain, or fever then you should go to the emergency department.  You should get a call from the surgery office to schedule an appointment.   Call or return with any questions.    Hernia A hernia happens when an organ or tissue inside your body pushes out through a weak spot in the belly (abdomen). Follow these instructions at home:  Avoid stretching or overusing (straining) the muscles near the hernia.  Do not lift anything heavier than 10 lb (4.5 kg).  Use the muscles in your leg when you lift something up. Do not use the muscles in your back.  When you cough, try to cough gently.  Eat a diet that has a lot of fiber. Eat lots of fruits and vegetables.  Drink enough fluids to keep your pee (urine) clear or pale yellow. Try to drink 6-8 glasses of water a day.  Take medicines to make your poop soft (stool softeners) as told by your doctor.  Lose weight, if you are overweight.  Do not use any tobacco products, including cigarettes, chewing tobacco, or electronic cigarettes. If you need help quitting, ask your doctor.  Keep all follow-up visits as told by your doctor. This is important. Contact a doctor if:  The skin by the hernia gets puffy (swollen) or red.  The hernia is painful. Get help right away if:  You have a fever.  You have belly pain that is getting worse.  You feel sick to your stomach (nauseous) or you throw up (vomit).  You cannot push the hernia back in place by gently pressing on it while you are lying down.  The hernia: ? Changes in shape or size. ? Is stuck outside your belly. ? Changes color. ? Feels hard or tender. This information is not intended to replace advice given to you by your health care provider. Make sure you discuss any questions you have with your health care provider. Document Released: 05/03/2010 Document  Revised: 04/20/2016 Document Reviewed: 09/23/2014 Elsevier Interactive Patient Education  [employer].

## 2018-01-09 DIAGNOSIS — D631 Anemia in chronic kidney disease: Secondary | ICD-10-CM | POA: Diagnosis not present

## 2018-01-09 DIAGNOSIS — N2581 Secondary hyperparathyroidism of renal origin: Secondary | ICD-10-CM | POA: Diagnosis not present

## 2018-01-09 DIAGNOSIS — E440 Moderate protein-calorie malnutrition: Secondary | ICD-10-CM | POA: Diagnosis not present

## 2018-01-09 DIAGNOSIS — N186 End stage renal disease: Secondary | ICD-10-CM | POA: Diagnosis not present

## 2018-01-09 DIAGNOSIS — K769 Liver disease, unspecified: Secondary | ICD-10-CM | POA: Diagnosis not present

## 2018-01-09 DIAGNOSIS — K650 Generalized (acute) peritonitis: Secondary | ICD-10-CM | POA: Diagnosis not present

## 2018-01-10 ENCOUNTER — Other Ambulatory Visit: Payer: Self-pay

## 2018-01-10 ENCOUNTER — Encounter (HOSPITAL_COMMUNITY): Payer: Self-pay | Admitting: Emergency Medicine

## 2018-01-10 DIAGNOSIS — Z87891 Personal history of nicotine dependence: Secondary | ICD-10-CM | POA: Diagnosis not present

## 2018-01-10 DIAGNOSIS — E119 Type 2 diabetes mellitus without complications: Secondary | ICD-10-CM | POA: Diagnosis not present

## 2018-01-10 DIAGNOSIS — Z8673 Personal history of transient ischemic attack (TIA), and cerebral infarction without residual deficits: Secondary | ICD-10-CM | POA: Diagnosis not present

## 2018-01-10 DIAGNOSIS — R1084 Generalized abdominal pain: Secondary | ICD-10-CM | POA: Diagnosis not present

## 2018-01-10 DIAGNOSIS — K652 Spontaneous bacterial peritonitis: Secondary | ICD-10-CM | POA: Insufficient documentation

## 2018-01-10 DIAGNOSIS — N2581 Secondary hyperparathyroidism of renal origin: Secondary | ICD-10-CM | POA: Diagnosis not present

## 2018-01-10 DIAGNOSIS — Z79899 Other long term (current) drug therapy: Secondary | ICD-10-CM | POA: Insufficient documentation

## 2018-01-10 DIAGNOSIS — Z992 Dependence on renal dialysis: Secondary | ICD-10-CM | POA: Diagnosis not present

## 2018-01-10 DIAGNOSIS — D631 Anemia in chronic kidney disease: Secondary | ICD-10-CM | POA: Diagnosis not present

## 2018-01-10 DIAGNOSIS — Z794 Long term (current) use of insulin: Secondary | ICD-10-CM | POA: Diagnosis not present

## 2018-01-10 DIAGNOSIS — K650 Generalized (acute) peritonitis: Secondary | ICD-10-CM | POA: Diagnosis not present

## 2018-01-10 DIAGNOSIS — N186 End stage renal disease: Secondary | ICD-10-CM | POA: Diagnosis not present

## 2018-01-10 DIAGNOSIS — K769 Liver disease, unspecified: Secondary | ICD-10-CM | POA: Diagnosis not present

## 2018-01-10 DIAGNOSIS — E440 Moderate protein-calorie malnutrition: Secondary | ICD-10-CM | POA: Diagnosis not present

## 2018-01-10 DIAGNOSIS — I120 Hypertensive chronic kidney disease with stage 5 chronic kidney disease or end stage renal disease: Secondary | ICD-10-CM | POA: Diagnosis not present

## 2018-01-10 DIAGNOSIS — N281 Cyst of kidney, acquired: Secondary | ICD-10-CM | POA: Diagnosis not present

## 2018-01-10 LAB — URINALYSIS, ROUTINE W REFLEX MICROSCOPIC
Bilirubin Urine: NEGATIVE
Glucose, UA: >=500 mg/dL — AB
Hgb urine dipstick: NEGATIVE
KETONES UR: NEGATIVE mg/dL
LEUKOCYTES UA: NEGATIVE
Nitrite: NEGATIVE
PROTEIN: 100 mg/dL — AB
Specific Gravity, Urine: 1.008 (ref 1.005–1.030)
pH: 8 (ref 5.0–8.0)

## 2018-01-10 LAB — COMPREHENSIVE METABOLIC PANEL WITH GFR
ALT: 14 U/L — ABNORMAL LOW (ref 17–63)
AST: 19 U/L (ref 15–41)
Albumin: 2.5 g/dL — ABNORMAL LOW (ref 3.5–5.0)
Alkaline Phosphatase: 50 U/L (ref 38–126)
Anion gap: 16 — ABNORMAL HIGH (ref 5–15)
BUN: 32 mg/dL — ABNORMAL HIGH (ref 6–20)
CO2: 27 mmol/L (ref 22–32)
Calcium: 8.9 mg/dL (ref 8.9–10.3)
Chloride: 94 mmol/L — ABNORMAL LOW (ref 101–111)
Creatinine, Ser: 8.85 mg/dL — ABNORMAL HIGH (ref 0.61–1.24)
GFR calc Af Amer: 6 mL/min — ABNORMAL LOW
GFR calc non Af Amer: 6 mL/min — ABNORMAL LOW
Glucose, Bld: 114 mg/dL — ABNORMAL HIGH (ref 65–99)
Potassium: 3.1 mmol/L — ABNORMAL LOW (ref 3.5–5.1)
Sodium: 137 mmol/L (ref 135–145)
Total Bilirubin: 0.6 mg/dL (ref 0.3–1.2)
Total Protein: 6.5 g/dL (ref 6.5–8.1)

## 2018-01-10 LAB — CBC
HCT: 25.8 % — ABNORMAL LOW (ref 39.0–52.0)
Hemoglobin: 8.5 g/dL — ABNORMAL LOW (ref 13.0–17.0)
MCH: 28.5 pg (ref 26.0–34.0)
MCHC: 32.9 g/dL (ref 30.0–36.0)
MCV: 86.6 fL (ref 78.0–100.0)
Platelets: 263 10*3/uL (ref 150–400)
RBC: 2.98 MIL/uL — ABNORMAL LOW (ref 4.22–5.81)
RDW: 15.2 % (ref 11.5–15.5)
WBC: 6.5 10*3/uL (ref 4.0–10.5)

## 2018-01-10 LAB — LIPASE, BLOOD: LIPASE: 28 U/L (ref 11–51)

## 2018-01-10 IMAGING — MR MR HEAD W/O CM
9 of 11 series · 35 of 48 positions shown · non-contrast
Comparison: CT HEAD November 24, 2016

CLINICAL DATA: Unresponsive, hyperglycemia and seizures. History of
hypertension, diabetes and end-stage renal disease on dialysis.

EXAM:
MRI HEAD WITHOUT CONTRAST
TECHNIQUE: Multiplanar, multiecho pulse sequences of the brain and surrounding
structures were obtained without intravenous contrast.

[Series 3: DWI · axial · 3.0mm · 1.09mm/px · z∈[-28,+104]mm · 8 of 90 slices shown (1 of 4)]
[im 1/90]
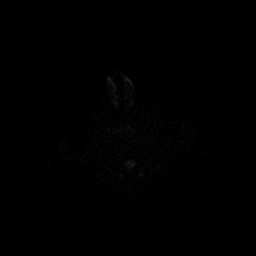
[im 10/90]
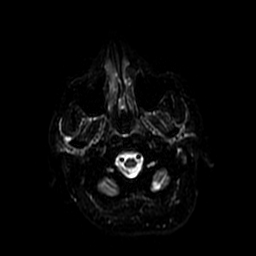
[im 30/90]
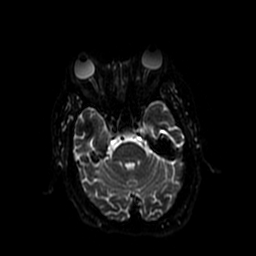
[im 40/90]
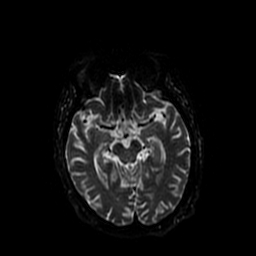
[im 50/90]
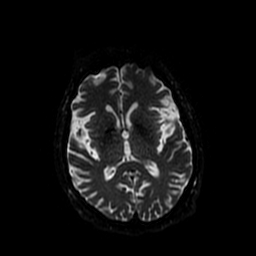
[im 60/90]
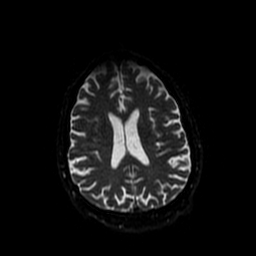
[im 80/90]
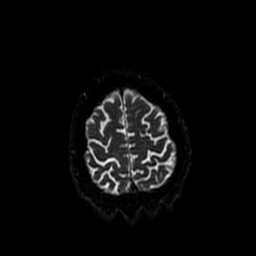
[im 90/90]
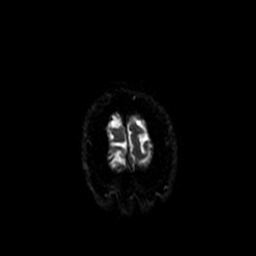

[Series 4: DWI · coronal · 5.0mm · 1.09mm/px · 8 of 70 slices shown (2 of 4)]
[im 1/70]
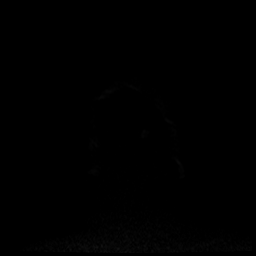
[im 10/70]
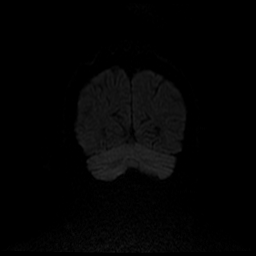
[im 20/70]
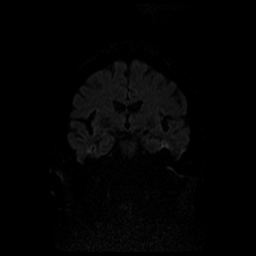
[im 30/70]
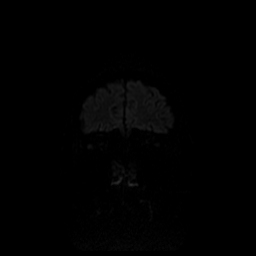
[im 40/70]
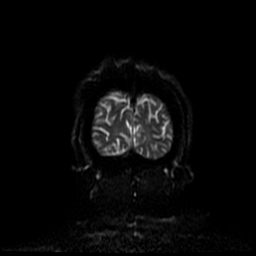
[im 50/70]
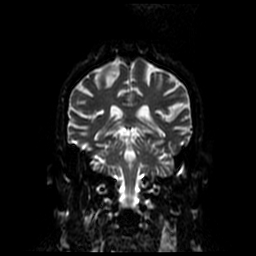
[im 60/70]
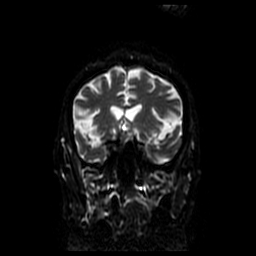
[im 70/70]
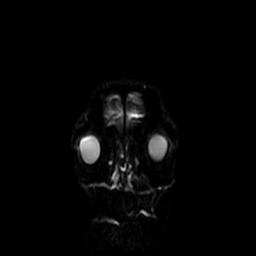

[Series 5: T2 · axial · 5.0mm · 0.47mm/px · z∈[-28,+104]mm · 2 of 23 slices shown (1 of 3)]
[im 1/23]
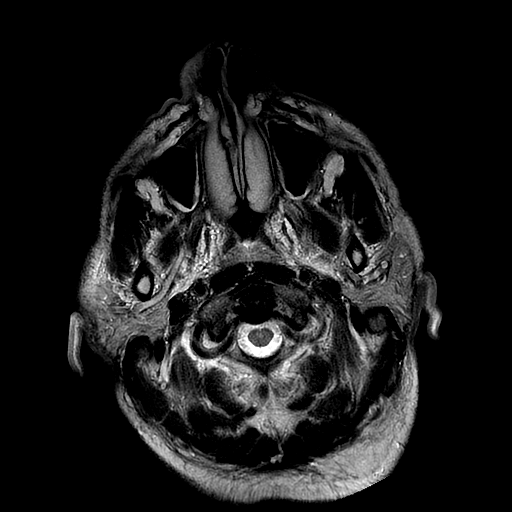
[im 23/23]
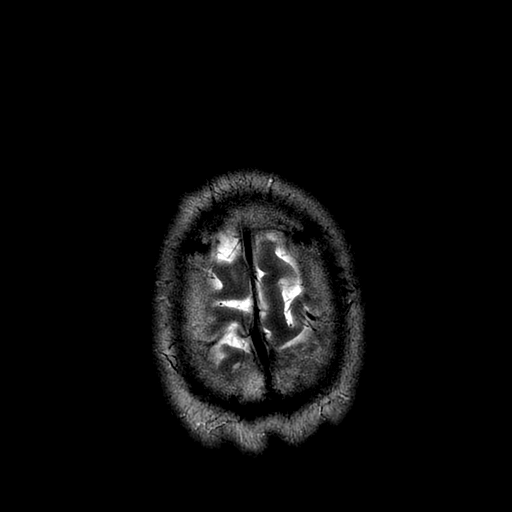

[Series 7: FLAIR · axial · 5.0mm · 0.47mm/px · z∈[-28,+104]mm · 2 of 23 slices shown]
[im 1/23]
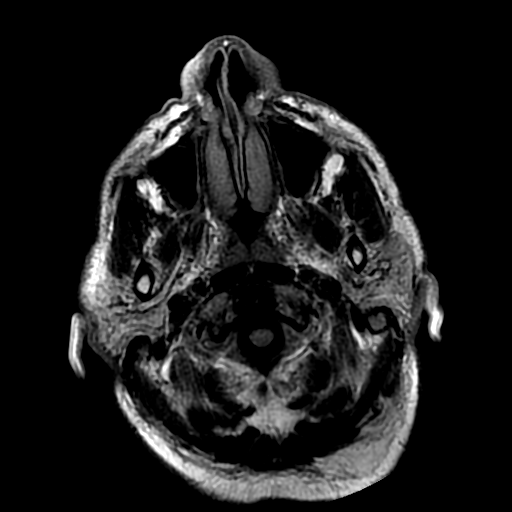
[im 23/23]
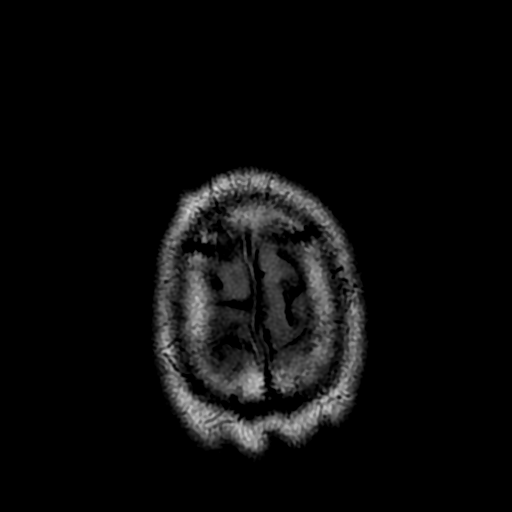

[Series 8: T1 · sagittal · 5.0mm · 0.47mm/px · 2 of 23 slices shown]
[im 1/23]
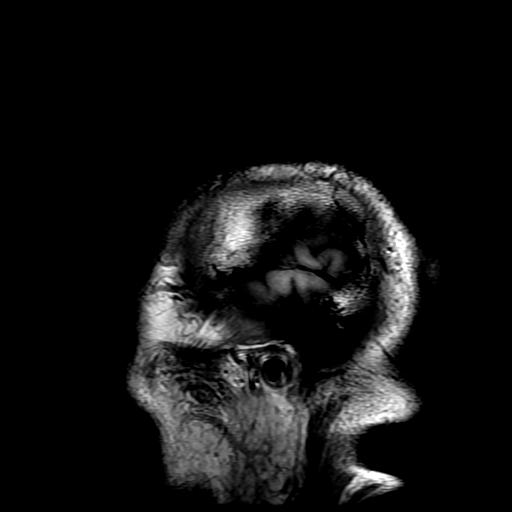
[im 23/23]
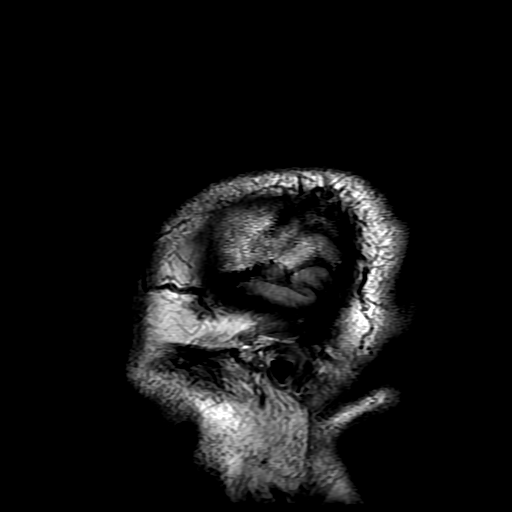

[Series 10: T2 · coronal · 5.0mm · 0.43mm/px · 3 of 30 slices shown (2 of 3)]
[im 1/30]
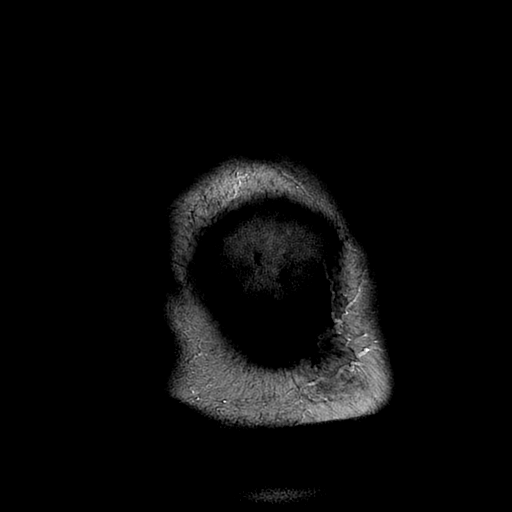
[im 15/30]
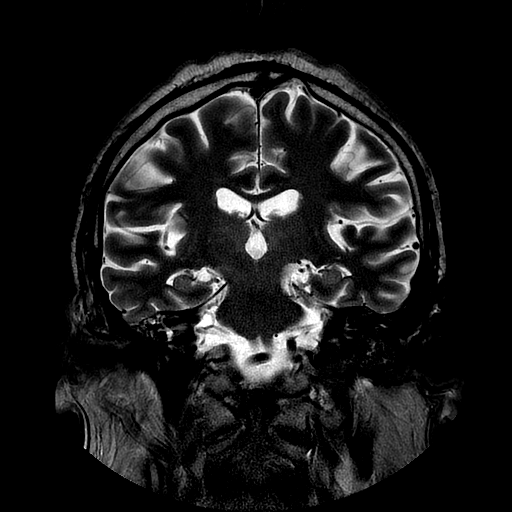
[im 30/30]
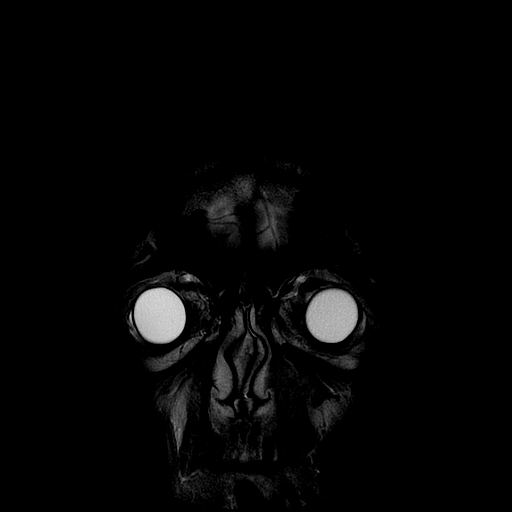

[Series 11: T2 · coronal · 3.0mm · 0.35mm/px · 3 of 28 slices shown (3 of 3)]
[im 1/28]
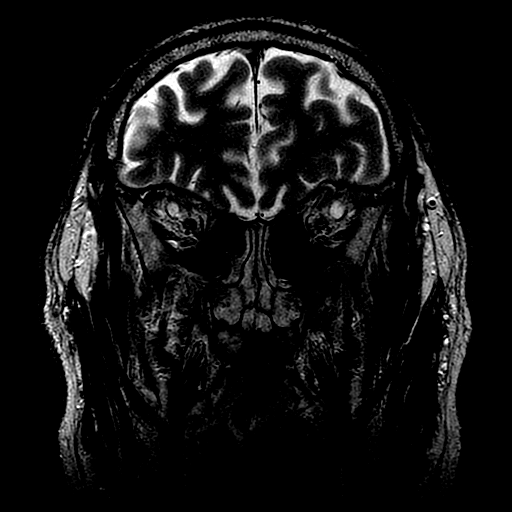
[im 14/28]
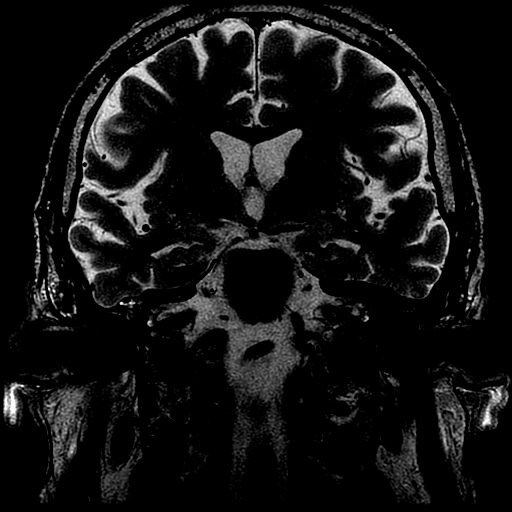
[im 28/28]
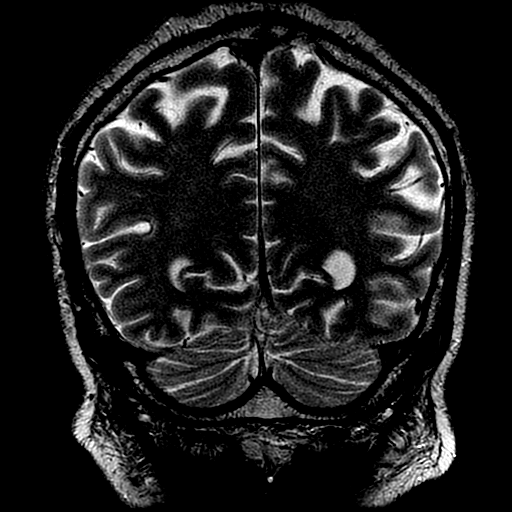

[Series 300: DWI · axial · 3.0mm · 1.09mm/px · z∈[-28,+104]mm · 4 of 45 slices shown (3 of 4)]
[im 1/45]
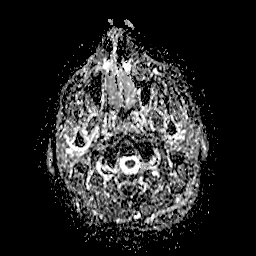
[im 15/45]
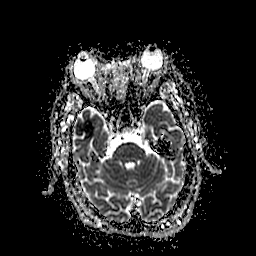
[im 30/45]
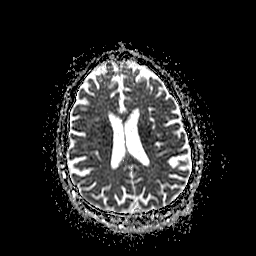
[im 45/45]
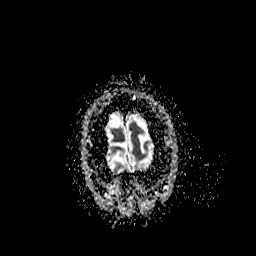

[Series 400: DWI · coronal · 5.0mm · 1.09mm/px · 3 of 35 slices shown (4 of 4)]
[im 1/35]
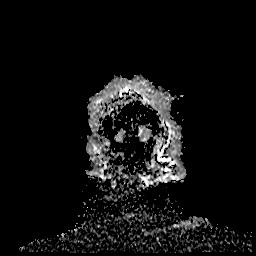
[im 18/35]
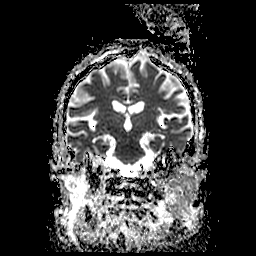
[im 35/35]
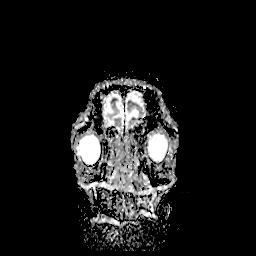

[35 of 48 positions shown; findings below may reference images not displayed]

FINDINGS: BRAIN: No reduced diffusion to suggest acute ischemia. No
susceptibility artifact to suggest hemorrhage. The ventricles and
sulci are normal for patient's age. No suspicious parenchymal
signal, masses or mass effect. No abnormal extra-axial fluid
collections. No extra-axial masses though, contrast enhanced
sequences would be more sensitive. Symmetric normal size, morphology
and signal of the hippocampi.

VASCULAR: Normal major intracranial vascular flow voids present at
skull base.

SKULL AND UPPER CERVICAL SPINE: No abnormal sellar expansion. No
suspicious calvarial bone marrow signal. Craniocervical junction
maintained. Moderate LEFT temporomandibular osteoarthrosis. Small
RIGHT temporomandibular joint effusion.

SINUSES/ORBITS: Trace paranasal sinus mucosal thickening. RIGHT
concha bullosa. Small mastoid effusions. Prominent superior
ophthalmic veins compatible with ventilation. The included ocular
globes and orbital contents are non-suspicious.

OTHER: LEFT suboccipital small scalp lipoma. Life-support lines in
place.
IMPRESSION: Negative MRI head for age.

## 2018-01-10 NOTE — ED Triage Notes (Signed)
Pt presents to ED for assessment of LLQ pain, associated with peritoneal dialysis port with diffuse abdominal pain as well.  Pt's last peritoneal dialysis yesterday with clear return.  Pt denies fevers or chills at home.

## 2018-01-10 NOTE — ED Provider Notes (Signed)
Patient placed in Quick Look pathway, seen and evaluated   Chief Complaint: Abdominal pain  HPI:  ESRD patient on peritoneal dialysis presents to ER with generalized abdominal pain which was 10/10 in severity earlier today, but now down to 2/10. Peritoneal dialysis port in LLQ. Pain feels dull and aching. No vomiting/diarrhea, fever or chills. Normal bowel movement yesterday.   ROS: no fever/chills   Physical Exam:   Gen: No distress  Neuro: Awake and Alert  Skin: Warm    Focused Exam: Abdomen soft, no guarding or rigidity.   Initiation of care has begun. The patient has been counseled on the process, plan, and necessity for staying for the completion/evaluation, and the remainder of the medical screening examination    Jared [name] 01/10/18 Gwinda Passe, MD 01/11/18 1125

## 2018-01-11 ENCOUNTER — Emergency Department (HOSPITAL_COMMUNITY)
Admission: EM | Admit: 2018-01-11 | Discharge: 2018-01-11 | Disposition: A | Discharge status: Home / Self Care | Payer: Medicare Other | Attending: Emergency Medicine | Admitting: Emergency Medicine

## 2018-01-11 ENCOUNTER — Emergency Department (HOSPITAL_COMMUNITY): Payer: Medicare Other

## 2018-01-11 DIAGNOSIS — K769 Liver disease, unspecified: Secondary | ICD-10-CM | POA: Diagnosis not present

## 2018-01-11 DIAGNOSIS — K650 Generalized (acute) peritonitis: Secondary | ICD-10-CM | POA: Diagnosis not present

## 2018-01-11 DIAGNOSIS — K652 Spontaneous bacterial peritonitis: Secondary | ICD-10-CM

## 2018-01-11 DIAGNOSIS — N281 Cyst of kidney, acquired: Secondary | ICD-10-CM | POA: Diagnosis not present

## 2018-01-11 DIAGNOSIS — D631 Anemia in chronic kidney disease: Secondary | ICD-10-CM | POA: Diagnosis not present

## 2018-01-11 DIAGNOSIS — E440 Moderate protein-calorie malnutrition: Secondary | ICD-10-CM | POA: Diagnosis not present

## 2018-01-11 DIAGNOSIS — N186 End stage renal disease: Secondary | ICD-10-CM | POA: Diagnosis not present

## 2018-01-11 DIAGNOSIS — N2581 Secondary hyperparathyroidism of renal origin: Secondary | ICD-10-CM | POA: Diagnosis not present

## 2018-01-11 DIAGNOSIS — R1084 Generalized abdominal pain: Secondary | ICD-10-CM

## 2018-01-11 LAB — CBG MONITORING, ED
GLUCOSE-CAPILLARY: 91 mg/dL (ref 65–99)
GLUCOSE-CAPILLARY: 65 mg/dL (ref 65–99)
Glucose-Capillary: 64 mg/dL — ABNORMAL LOW (ref 65–99)

## 2018-01-11 LAB — GRAM STAIN

## 2018-01-11 LAB — BODY FLUID CELL COUNT WITH DIFFERENTIAL
Eos, Fluid: NONE SEEN %
Lymphs, Fluid: 3 %
MONOCYTE-MACROPHAGE-SEROUS FLUID: 11 % — AB (ref 50–90)
Neutrophil Count, Fluid: 86 % — ABNORMAL HIGH (ref 0–25)
Total Nucleated Cell Count, Fluid: 1813 cu mm — ABNORMAL HIGH (ref 0–1000)

## 2018-01-11 MED ORDER — DOCUSATE SODIUM 100 MG PO CAPS: 100.0000 mg | ORAL_CAPSULE | Freq: Two times a day (BID) | ORAL | 0 refills | Status: DC

## 2018-01-11 MED ORDER — IOPAMIDOL (ISOVUE-300) INJECTION 61%
INTRAVENOUS | Status: AC
  Filled 2018-01-11: qty 100

## 2018-01-11 MED ORDER — SODIUM CHLORIDE 0.9 % IV SOLN
2.0000 g | Freq: Once | INTRAVENOUS | Status: DC
  Filled 2018-01-11: qty 2

## 2018-01-11 MED ORDER — VANCOMYCIN HCL 10 G IV SOLR
1500.0000 mg | Freq: Once | INTRAVENOUS | Status: AC
  Administered 2018-01-11: 1500 mg via INTRAVENOUS
  Filled 2018-01-11: qty 1500

## 2018-01-11 MED ORDER — OXYCODONE-ACETAMINOPHEN 5-325 MG PO TABS: 1.0000 | ORAL_TABLET | ORAL | 0 refills | Status: DC | PRN

## 2018-01-11 NOTE — ED Notes (Signed)
MD updated on CBG.  Pt given juice, apple sauce, and sandwich.

## 2018-01-11 NOTE — ED Notes (Signed)
D/c reviewed with patient. Encouraged to go to home-dialysis tomorrow at noon

## 2018-01-11 NOTE — Discharge Instructions (Signed)
1.  [facility] on [address].  You will be instructed on how to use your antibiotics and when.  Follow-up with your dialysis center as per their instructions after treatment today.

## 2018-01-11 NOTE — ED Notes (Signed)
Patient requested blood sugar to be taken.

## 2018-01-11 NOTE — ED Notes (Signed)
Patient resting comfortably on stretcher

## 2018-01-11 NOTE — ED Provider Notes (Signed)
TIME SEEN: 1:13 AM  CHIEF COMPLAINT: Abdominal pain  HPI: Patient is a 62 year old male with history of hypertension, diabetes, end-stage renal disease on peritoneal dialysis for the last 3 years who presents to the emergency department with abdominal pain.  States for the past 2 weeks he has noticed some swelling in the left inguinal area and thought that he had a hernia.  States that this area has been easily reducible and has had no discoloration, redness or warmth.  States however over the past couple of days he has had increasing abdominal pain that extends from this area now diffusely throughout the abdomen.  He has had normal bowel movements without blood or melena denies fevers, chills, nausea, vomiting or diarrhea.  Became concerned because of increasing abdominal pain.  No history of previous abdominal surgery.  He does peritoneal dialysis at home and states he dialyzed himself prior to arrival and that the diasylate fluid appeared normal.  Nephrologist is Dr. Lorrene Reid  ROS: See HPI Constitutional: no fever  Eyes: no drainage  ENT: no runny nose   Cardiovascular:  no chest pain  Resp: no SOB  GI: no vomiting GU: no dysuria Integumentary: no rash  Allergy: no hives  Musculoskeletal: no leg swelling  Neurological: no slurred speech ROS otherwise negative  PAST MEDICAL HISTORY/PAST SURGICAL HISTORY:  Past Medical History:  Diagnosis Date  . Anemia   . Chronic kidney disease    on dialysis M-W-F  . Diabetes mellitus without complication ([place])    onset as adult  . Hypertension   . Seizures ([place])   . Stroke ([place])   . Thyroid disease    hyperparathyroidism    MEDICATIONS:  Prior to Admission medications   Medication Sig Start Date End Date Taking? Authorizing Provider  amLODipine (NORVASC) 10 MG tablet Take 1 tablet (10 mg total) by mouth daily. 12/02/17   Raiford Noble Latif, DO  atorvastatin (LIPITOR) 20 MG tablet Take 1 tablet (20 mg total) by mouth daily. 03/27/17    Angiulli, Lavon Paganini, PA-C  cloNIDine (CATAPRES - DOSED IN MG/24 HR) 0.3 mg/24hr patch Place 1 patch (0.3 mg total) onto the skin once a week. 12/01/17   Sheikh, Omair Latif, DO  fluticasone (FLONASE) 50 MCG/ACT nasal spray Place 1 spray into both nostrils daily. 12/02/17   Raiford Noble Latif, DO  hydrALAZINE (APRESOLINE) 100 MG tablet Take 1 tablet (100 mg total) by mouth 3 (three) times daily. 03/27/17   Angiulli, Lavon Paganini, PA-C  insulin NPH Human (NOVOLIN N) 100 UNIT/ML injection Inject 20 units every morning Patient taking differently: Inject 30 Units into the skin daily before breakfast.  04/03/17   Renato Shin, MD  isosorbide mononitrate (IMDUR) 30 MG 24 hr tablet TAKE 1 TABLET BY MOUTH ONCE DAILY 07/26/17   Henson, Vickie L, NP-C  levETIRAcetam (KEPPRA) 250 MG tablet Take 1 tablet (250 mg total) by mouth 2 (two) times daily. 12/04/17    Givens, NP  loratadine (CLARITIN) 10 MG tablet Take 1 tablet (10 mg total) by mouth daily. 12/02/17   Raiford Noble Latif, DO  meclizine (ANTIVERT) 12.5 MG tablet Take 1 tablet (12.5 mg total) by mouth 3 (three) times daily as needed for dizziness. 12/01/17   Raiford Noble Latif, DO  metoprolol tartrate (LOPRESSOR) 100 MG tablet Take 1 tablet (100 mg total) by mouth 2 (two) times daily. 05/21/17   Henson, Vickie L, NP-C  multivitamin (RENA-VIT) TABS tablet Take 1 tablet by mouth daily.    [provider]  pantoprazole (PROTONIX) 40 MG tablet Take 1 tablet (40 mg total) by mouth daily at 6 (six) AM. 05/21/17   Henson, Vickie L, NP-C  sevelamer carbonate (RENVELA) 800 MG tablet Take 3 tablets (2,400 mg total) by mouth 3 (three) times daily with meals. 03/27/17   Angiulli, Lavon Paganini, PA-C    ALLERGIES:  Allergies  Allergen Reactions  . Penicillins Other (See Comments)    Has patient had a PCN reaction causing immediate rash, facial/tongue/throat swelling, SOB or lightheadedness with hypotension: Unk Has patient had a PCN reaction causing severe rash involving  mucus membranes or skin necrosis: Unk Has patient had a PCN reaction that required hospitalization: Unk Has patient had a PCN reaction occurring within the last 10 years: Unk If all of the above answers are "NO", then may proceed with Cephalosporin use.     SOCIAL HISTORY:  Social History   Tobacco Use  . Smoking status: Former Smoker    Last attempt to quit: 05/11/1984    Years since quitting: 33.6  . Smokeless tobacco: Never Used  Substance Use Topics  . Alcohol use: Yes    Alcohol/week: 0.6 oz    Types: 1 Glasses of wine per week    Comment: occasional    FAMILY HISTORY: Family History  Problem Relation Age of Onset  . Cancer - Lung Mother   . Cancer - Other Father   . Diabetes Neg Hx     EXAM: BP 140/82   Pulse 79   Temp 98 F (36.7 C) (Oral)   Resp 16   SpO2 98%  CONSTITUTIONAL: Alert and oriented and responds appropriately to questions. Well-appearing; well-nourished HEAD: Normocephalic EYES: Conjunctivae clear, pupils appear equal, EOMI ENT: normal nose; moist mucous membranes NECK: Supple, no meningismus, no nuchal rigidity, no LAD  CARD: RRR; S1 and S2 appreciated; no murmurs, no clicks, no rubs, no gallops RESP: Normal chest excursion without splinting or tachypnea; breath sounds clear and equal bilaterally; no wheezes, no rhonchi, no rales, no hypoxia or respiratory distress, speaking full sentences ABD/GI: Normal bowel sounds; non-distended; soft, tender throughout the abdomen with voluntary guarding, no rebound, patient has a dialysis catheter in the left lower quadrant without surrounding redness, warmth, drainage or bleeding.  He does have a small left inguinal hernia that is easily reproducible with no overlying skin changes. BACK:  The back appears normal and is non-tender to palpation, there is no CVA tenderness EXT: Normal ROM in all joints; non-tender to palpation; no edema; normal capillary refill; no cyanosis, no calf tenderness or swelling    SKIN:  Normal color for age and race; warm; no rash NEURO: Moves all extremities equally PSYCH: The patient's mood and manner are appropriate. Grooming and personal hygiene are appropriate.  MEDICAL DECISION MAKING: Patient here with abdominal pain.  His labs have been unremarkable other than showing chronic kidney disease.  Potassium is 3.1.  No leukocytosis.  LFTs and lipase normal.  Urine shows no sign of infection.  Patient is rather tender throughout the abdomen with voluntary guarding.  I am concerned that his dialysate fluid could be infected although no fever, white count here and reports clear dialysis fluid at home.  Will discuss with nephrology to have a dialysis nurse come and draw some of this fluid.  We will also obtain a CT of his abdomen pelvis.  He declines any pain medication at this time.  Differential includes SBP, colitis, diverticulitis, appendicitis, bowel obstruction.  ED PROGRESS:    2:10 AM  D/w Dr. Florene Glen with nephrology who agrees that patient needs dialysis fluid sent.  Recommend sending a white blood cell differential, cell count, Gram stain and culture.  These tests have been ordered.  I did speak to the dialysis nurse, Tori.  She is currently on able to come to the emergency department given she has several patients undergoing hemodialysis currently.  She will come as soon as possible to obtain dialysate fluid.  I feel we can hold off on antibiotics at this time given he does not appear septic and wait for the Gram stain, cell count on this fluid.   3:30 AM  Pt's CT scan shows no acute abnormality.  We are waiting dialysis nurse to obtain dialysis fluid.  Delay has been explained to patient by nursing staff.   6:45 AM  Dialysis nurse at bedside to send PD fluid.  7:10 AM  Signed out to Dr. Johnney Killian.  She will follow up on patient's PD fluid results.  If unremarkable, patient will be discharged home with outpatient PCP follow-up.  I reviewed all nursing notes, vitals,  pertinent previous records, EKGs, lab and urine results, imaging (as available).    Derrica Sieg, Delice Bison, DO 01/11/18 [phone]

## 2018-01-11 NOTE — ED Notes (Signed)
Pt resting.  Rise and fall of chest noted.

## 2018-01-11 NOTE — ED Notes (Signed)
Pt states that his blood sugar feels low.  Will check CBG.  Dr aware.

## 2018-01-11 NOTE — ED Provider Notes (Signed)
Patient was seen and interviewed by myself.  He did report about 3 days of abdominal pain.  At times it had been intense but at other times nearly resolved.  Patient denies any pain at this time.  He has not had fever.  He is alert and nontoxic.  No distress.  Abdominal examination is completely soft without tenderness or guarding.  I have reassessed the inguinal area where he reports previously had some swelling.  This is soft without mass or fullness.  No lymphadenopathy.  No soft tissue changes.  PD fluid was significantly delayed due to awaiting available PD nurse for specimen collection.  Once returned, white count was tightly over 1800.  This is reviewed with Dr. Justin Mend.  He advises for Fortaz 2 g IV and vancomycin 1.5 g IV.  As the patient is asymptomatic with normal vital signs, no pain and lab work normal, Dr. Justin Mend advises patient can continue outpatient antibiotics.  He will make arrangements for the patient to go directly to the home training center from the emergency department where he will be counseled and instructed on his antibiotic usage and frequency.  This is been reviewed with the patient.  He is alert and well in appearance, he is agreeable to this plan not wishing to be hospitalized at this time.  Will treat with Percocet for pain control and Colace if needed for constipation with narcotics.   Charlesetta Shanks, MD 01/11/18 1344

## 2018-01-11 NOTE — ED Notes (Signed)
Patient returned from [place].  Per MD Ward, patient to have dialysis done tonight. Awaiting dialysis RN to have open bay to preform dialysis

## 2018-01-12 DIAGNOSIS — N2581 Secondary hyperparathyroidism of renal origin: Secondary | ICD-10-CM | POA: Diagnosis not present

## 2018-01-12 DIAGNOSIS — E440 Moderate protein-calorie malnutrition: Secondary | ICD-10-CM | POA: Diagnosis not present

## 2018-01-12 DIAGNOSIS — K769 Liver disease, unspecified: Secondary | ICD-10-CM | POA: Diagnosis not present

## 2018-01-12 DIAGNOSIS — N186 End stage renal disease: Secondary | ICD-10-CM | POA: Diagnosis not present

## 2018-01-12 DIAGNOSIS — K650 Generalized (acute) peritonitis: Secondary | ICD-10-CM | POA: Diagnosis not present

## 2018-01-12 DIAGNOSIS — D631 Anemia in chronic kidney disease: Secondary | ICD-10-CM | POA: Diagnosis not present

## 2018-01-13 DIAGNOSIS — N2581 Secondary hyperparathyroidism of renal origin: Secondary | ICD-10-CM | POA: Diagnosis not present

## 2018-01-13 DIAGNOSIS — E440 Moderate protein-calorie malnutrition: Secondary | ICD-10-CM | POA: Diagnosis not present

## 2018-01-13 DIAGNOSIS — N186 End stage renal disease: Secondary | ICD-10-CM | POA: Diagnosis not present

## 2018-01-13 DIAGNOSIS — K769 Liver disease, unspecified: Secondary | ICD-10-CM | POA: Diagnosis not present

## 2018-01-13 DIAGNOSIS — K650 Generalized (acute) peritonitis: Secondary | ICD-10-CM | POA: Diagnosis not present

## 2018-01-13 DIAGNOSIS — D631 Anemia in chronic kidney disease: Secondary | ICD-10-CM | POA: Diagnosis not present

## 2018-01-14 DIAGNOSIS — E440 Moderate protein-calorie malnutrition: Secondary | ICD-10-CM | POA: Diagnosis not present

## 2018-01-14 DIAGNOSIS — K769 Liver disease, unspecified: Secondary | ICD-10-CM | POA: Diagnosis not present

## 2018-01-14 DIAGNOSIS — N2581 Secondary hyperparathyroidism of renal origin: Secondary | ICD-10-CM | POA: Diagnosis not present

## 2018-01-14 DIAGNOSIS — K650 Generalized (acute) peritonitis: Secondary | ICD-10-CM | POA: Diagnosis not present

## 2018-01-14 DIAGNOSIS — N186 End stage renal disease: Secondary | ICD-10-CM | POA: Diagnosis not present

## 2018-01-14 DIAGNOSIS — D631 Anemia in chronic kidney disease: Secondary | ICD-10-CM | POA: Diagnosis not present

## 2018-01-14 LAB — PATHOLOGIST SMEAR REVIEW

## 2018-01-15 DIAGNOSIS — N2581 Secondary hyperparathyroidism of renal origin: Secondary | ICD-10-CM | POA: Diagnosis not present

## 2018-01-15 DIAGNOSIS — K769 Liver disease, unspecified: Secondary | ICD-10-CM | POA: Diagnosis not present

## 2018-01-15 DIAGNOSIS — N186 End stage renal disease: Secondary | ICD-10-CM | POA: Diagnosis not present

## 2018-01-15 DIAGNOSIS — K650 Generalized (acute) peritonitis: Secondary | ICD-10-CM | POA: Diagnosis not present

## 2018-01-15 DIAGNOSIS — D631 Anemia in chronic kidney disease: Secondary | ICD-10-CM | POA: Diagnosis not present

## 2018-01-15 DIAGNOSIS — E440 Moderate protein-calorie malnutrition: Secondary | ICD-10-CM | POA: Diagnosis not present

## 2018-01-15 LAB — CULTURE, BODY FLUID-BOTTLE

## 2018-01-15 LAB — CULTURE, BODY FLUID W GRAM STAIN -BOTTLE

## 2018-01-16 ENCOUNTER — Telehealth: Payer: Self-pay | Admitting: Emergency Medicine

## 2018-01-16 DIAGNOSIS — D631 Anemia in chronic kidney disease: Secondary | ICD-10-CM | POA: Diagnosis not present

## 2018-01-16 DIAGNOSIS — E440 Moderate protein-calorie malnutrition: Secondary | ICD-10-CM | POA: Diagnosis not present

## 2018-01-16 DIAGNOSIS — K769 Liver disease, unspecified: Secondary | ICD-10-CM | POA: Diagnosis not present

## 2018-01-16 DIAGNOSIS — N186 End stage renal disease: Secondary | ICD-10-CM | POA: Diagnosis not present

## 2018-01-16 DIAGNOSIS — N2581 Secondary hyperparathyroidism of renal origin: Secondary | ICD-10-CM | POA: Diagnosis not present

## 2018-01-16 DIAGNOSIS — K650 Generalized (acute) peritonitis: Secondary | ICD-10-CM | POA: Diagnosis not present

## 2018-01-16 NOTE — Telephone Encounter (Signed)
Post ED Visit - Positive Culture Follow-up  Culture report reviewed by antimicrobial stewardship pharmacist:  [x]  Elenor Quinones, Pharm.D. []  Heide Guile, Pharm.D., BCPS AQ-ID []  Parks Neptune, Pharm.D., BCPS []  Alycia Rossetti, Pharm.D., BCPS []  Center, Pharm.D., BCPS, AAHIVP []  Legrand Como, Pharm.D., BCPS, AAHIVP []  Salome Arnt, PharmD, BCPS []  Jalene Mullet, PharmD []  Vincenza Hews, PharmD, BCPS  Positive body fluid culture Treated with viridans, culture report faxed to Dr> [facility] office @ [facility] @ [phone]  Hazle Nordmann 01/16/2018, 1:15 PM

## 2018-01-17 DIAGNOSIS — K650 Generalized (acute) peritonitis: Secondary | ICD-10-CM | POA: Diagnosis not present

## 2018-01-17 DIAGNOSIS — N186 End stage renal disease: Secondary | ICD-10-CM | POA: Diagnosis not present

## 2018-01-17 DIAGNOSIS — K769 Liver disease, unspecified: Secondary | ICD-10-CM | POA: Diagnosis not present

## 2018-01-17 DIAGNOSIS — N2581 Secondary hyperparathyroidism of renal origin: Secondary | ICD-10-CM | POA: Diagnosis not present

## 2018-01-17 DIAGNOSIS — E440 Moderate protein-calorie malnutrition: Secondary | ICD-10-CM | POA: Diagnosis not present

## 2018-01-17 DIAGNOSIS — D631 Anemia in chronic kidney disease: Secondary | ICD-10-CM | POA: Diagnosis not present

## 2018-01-18 DIAGNOSIS — K650 Generalized (acute) peritonitis: Secondary | ICD-10-CM | POA: Diagnosis not present

## 2018-01-18 DIAGNOSIS — N2581 Secondary hyperparathyroidism of renal origin: Secondary | ICD-10-CM | POA: Diagnosis not present

## 2018-01-18 DIAGNOSIS — D631 Anemia in chronic kidney disease: Secondary | ICD-10-CM | POA: Diagnosis not present

## 2018-01-18 DIAGNOSIS — N186 End stage renal disease: Secondary | ICD-10-CM | POA: Diagnosis not present

## 2018-01-18 DIAGNOSIS — E440 Moderate protein-calorie malnutrition: Secondary | ICD-10-CM | POA: Diagnosis not present

## 2018-01-18 DIAGNOSIS — K769 Liver disease, unspecified: Secondary | ICD-10-CM | POA: Diagnosis not present

## 2018-01-19 DIAGNOSIS — N2581 Secondary hyperparathyroidism of renal origin: Secondary | ICD-10-CM | POA: Diagnosis not present

## 2018-01-19 DIAGNOSIS — D631 Anemia in chronic kidney disease: Secondary | ICD-10-CM | POA: Diagnosis not present

## 2018-01-19 DIAGNOSIS — K650 Generalized (acute) peritonitis: Secondary | ICD-10-CM | POA: Diagnosis not present

## 2018-01-19 DIAGNOSIS — E440 Moderate protein-calorie malnutrition: Secondary | ICD-10-CM | POA: Diagnosis not present

## 2018-01-19 DIAGNOSIS — K769 Liver disease, unspecified: Secondary | ICD-10-CM | POA: Diagnosis not present

## 2018-01-19 DIAGNOSIS — N186 End stage renal disease: Secondary | ICD-10-CM | POA: Diagnosis not present

## 2018-01-20 DIAGNOSIS — K769 Liver disease, unspecified: Secondary | ICD-10-CM | POA: Diagnosis not present

## 2018-01-20 DIAGNOSIS — N186 End stage renal disease: Secondary | ICD-10-CM | POA: Diagnosis not present

## 2018-01-20 DIAGNOSIS — D631 Anemia in chronic kidney disease: Secondary | ICD-10-CM | POA: Diagnosis not present

## 2018-01-20 DIAGNOSIS — E440 Moderate protein-calorie malnutrition: Secondary | ICD-10-CM | POA: Diagnosis not present

## 2018-01-20 DIAGNOSIS — K650 Generalized (acute) peritonitis: Secondary | ICD-10-CM | POA: Diagnosis not present

## 2018-01-20 DIAGNOSIS — N2581 Secondary hyperparathyroidism of renal origin: Secondary | ICD-10-CM | POA: Diagnosis not present

## 2018-01-21 DIAGNOSIS — N186 End stage renal disease: Secondary | ICD-10-CM | POA: Diagnosis not present

## 2018-01-21 DIAGNOSIS — D631 Anemia in chronic kidney disease: Secondary | ICD-10-CM | POA: Diagnosis not present

## 2018-01-21 DIAGNOSIS — K769 Liver disease, unspecified: Secondary | ICD-10-CM | POA: Diagnosis not present

## 2018-01-21 DIAGNOSIS — K650 Generalized (acute) peritonitis: Secondary | ICD-10-CM | POA: Diagnosis not present

## 2018-01-21 DIAGNOSIS — E440 Moderate protein-calorie malnutrition: Secondary | ICD-10-CM | POA: Diagnosis not present

## 2018-01-21 DIAGNOSIS — N2581 Secondary hyperparathyroidism of renal origin: Secondary | ICD-10-CM | POA: Diagnosis not present

## 2018-01-22 DIAGNOSIS — K650 Generalized (acute) peritonitis: Secondary | ICD-10-CM | POA: Diagnosis not present

## 2018-01-22 DIAGNOSIS — D631 Anemia in chronic kidney disease: Secondary | ICD-10-CM | POA: Diagnosis not present

## 2018-01-22 DIAGNOSIS — K769 Liver disease, unspecified: Secondary | ICD-10-CM | POA: Diagnosis not present

## 2018-01-22 DIAGNOSIS — N2581 Secondary hyperparathyroidism of renal origin: Secondary | ICD-10-CM | POA: Diagnosis not present

## 2018-01-22 DIAGNOSIS — E440 Moderate protein-calorie malnutrition: Secondary | ICD-10-CM | POA: Diagnosis not present

## 2018-01-22 DIAGNOSIS — N186 End stage renal disease: Secondary | ICD-10-CM | POA: Diagnosis not present

## 2018-01-23 ENCOUNTER — Ambulatory Visit: Payer: Self-pay | Admitting: General Surgery

## 2018-01-23 DIAGNOSIS — K4090 Unilateral inguinal hernia, without obstruction or gangrene, not specified as recurrent: Secondary | ICD-10-CM | POA: Diagnosis not present

## 2018-01-23 DIAGNOSIS — K769 Liver disease, unspecified: Secondary | ICD-10-CM | POA: Diagnosis not present

## 2018-01-23 DIAGNOSIS — K650 Generalized (acute) peritonitis: Secondary | ICD-10-CM | POA: Diagnosis not present

## 2018-01-23 DIAGNOSIS — D631 Anemia in chronic kidney disease: Secondary | ICD-10-CM | POA: Diagnosis not present

## 2018-01-23 DIAGNOSIS — N2581 Secondary hyperparathyroidism of renal origin: Secondary | ICD-10-CM | POA: Diagnosis not present

## 2018-01-23 DIAGNOSIS — N186 End stage renal disease: Secondary | ICD-10-CM | POA: Diagnosis not present

## 2018-01-23 DIAGNOSIS — E440 Moderate protein-calorie malnutrition: Secondary | ICD-10-CM | POA: Diagnosis not present

## 2018-01-23 NOTE — H&P (View-Only) (Signed)
istory of Present Illness Ralene Ok MD; 01/23/2018 2:09 PM) The patient is a 62 year old male who presents with an inguinal hernia. Referred by: Derald Macleod, NP Chief Complaint: Left inguinal hernia  Patient is a 62 year old male with a history of end-stage renal disease on peritoneal dialysis. Patient states she's had a left inguinal hernia for the last 3-6 months. He states that is bothersome when he which wears tighter clothing. He states that he is able to reduce it when he lays down. The patient's had no signs or symptoms of incarceration or strangulation. Patient does perineal dialysis daily. He has a left lower quadrant perineal dialysis catheter in place. He's had no previous abdominal surgeries.    Past Surgical History Alean Rinne, [place]; 01/23/2018 1:52 PM) Dialysis Shunt / Fistula   Allergies Alean Rinne, [place]; 01/23/2018 1:51 PM) No Known Drug Allergies [01/23/2018]: (Marked as Inactive) Penicillins  Allergies Reconciled   Medication History Alean Rinne, RMA; 01/23/2018 1:52 PM) Fluticasone Propionate (50MCG/ACT Suspension, Nasal) Active. Sevelamer Carbonate (800MG  Tablet, Oral) Active. Pantoprazole Sodium (40MG  Tablet DR, Oral) Active. Metoprolol Tartrate (100MG  Tablet, Oral) Active. Meclizine HCl (12.5MG  Tablet, Oral) Active. LevETIRAcetam (250MG  Tablet, Oral) Active. HydrALAZINE HCl (100MG  Tablet, Oral) Active. CloNIDine (0.3MG /24HR Patch Weekly, Transdermal) Active. Ciprofloxacin HCl (500MG  Tablet, Oral) Active. AmLODIPine Besylate (10MG  Tablet, Oral) Active.  Social History Alean Rinne, [place]; 01/23/2018 1:52 PM) Alcohol use  Occasional alcohol use. No caffeine use  No drug use  Tobacco use  Former smoker.  Family History Alean Rinne, [place]; 01/23/2018 1:52 PM) Arthritis  Mother, Sister. Breast Cancer  Mother. Colon Polyps  Sister. Heart Disease  Mother. Hypertension  Mother, Sister.  Other Problems Alean Rinne,  [place]; 01/23/2018 1:52 PM) Back Pain  Chronic Renal Failure Syndrome  Diabetes Mellitus  Enlarged Prostate  High blood pressure  Prostate Cancer     Review of Systems Ralene Ok MD; 01/23/2018 2:05 PM) General Present- Appetite Loss, Fatigue, Night Sweats and Weight Loss. Not Present- Chills, Fever and Weight Gain. Skin Present- Dryness. Not Present- Change in Wart/Mole, Hives, Jaundice, New Lesions, Non-Healing Wounds, Rash and Ulcer. HEENT Present- Visual Disturbances and Wears glasses/contact lenses. Not Present- Earache, Hearing Loss, Hoarseness, Nose Bleed, Oral Ulcers, Ringing in the Ears, Seasonal Allergies, Sinus Pain, Sore Throat and Yellow Eyes. Respiratory Present- Chronic Cough. Not Present- Bloody sputum, Difficulty Breathing, Snoring and Wheezing. Cardiovascular Not Present- Chest Pain, Difficulty Breathing Lying Down, Leg Cramps, Palpitations, Rapid Heart Rate, Shortness of Breath and Swelling of Extremities. Gastrointestinal Present- Bloating, Change in Bowel Habits, Constipation, Excessive gas, Gets full quickly at meals and Vomiting. Not Present- Abdominal Pain, Bloody Stool, Chronic diarrhea, Difficulty Swallowing, Hemorrhoids, Indigestion, Nausea and Rectal Pain. Male Genitourinary Not Present- Blood in Urine, Change in Urinary Stream, Frequency, Impotence, Nocturia, Painful Urination, Urgency and Urine Leakage. Musculoskeletal Not Present- Back Pain, Joint Pain, Joint Stiffness, Muscle Pain, Muscle Weakness and Swelling of Extremities. Neurological Not Present- Decreased Memory, Fainting, Headaches, Numbness, Seizures, Tingling, Tremor, Trouble walking and Weakness. Psychiatric Present- Change in Sleep Pattern. Not Present- Anxiety, Bipolar, Depression, Fearful and Frequent crying. Endocrine Present- Cold Intolerance, Hair Changes and Hot flashes. Not Present- Excessive Hunger, Heat Intolerance and New Diabetes. Hematology Present- Blood Thinners. Not Present- Easy  Bruising, Excessive bleeding, Gland problems, HIV and Persistent Infections. All other systems negative  Vitals Alean Rinne RMA; 01/23/2018 1:51 PM) 01/23/2018 1:50 PM Weight: 162.2 lb Height: 67in Body Surface Area: 1.85 m Body Mass Index: 25.4 kg/m  Temp.: 97.21F  Pulse: 71 (Regular)  BP: 110/78 (Sitting, Left Arm, Standard)       Physical Exam Ralene Ok MD; 01/23/2018 2:09 PM) The physical exam findings are as follows: Note:Constitutional: No acute distress, conversant, appears stated age  Eyes: Anicteric sclerae, moist conjunctiva, no lid lag  Neck: No thyromegaly, trachea midline, no cervical lymphadenopathy  Lungs: Clear to auscultation biilaterally, normal respiratory effot  Cardiovascular: regular rate & rhythm, no murmurs, no peripheal edema, pedal pulses 2+  GI: Soft, no masses or hepatosplenomegaly, non-tender to palpation  MSK: Normal gait, no clubbing cyanosis, edema  Skin: No rashes, palpation reveals normal skin turgor  Psychiatric: Appropriate judgment and insight, oriented to person, place, and time  Abdomen Inspection  Inspection of the abdomen reveals: Note: Left lower quadrant peritoneal dialysis catheter. Hernias - Inguinal hernia - Left - Reducible.    Assessment & Plan Ralene Ok MD; 01/23/2018 2:10 PM) LEFT INGUINAL HERNIA (K40.90) Impression: 62 year old male with a left inguinal hernia.  1. The patient will like to proceed to the operating room for laparoscopic left inguinal hernia repair with mesh.  2. I discussed with the patient the signs and symptoms of incarceration and strangulation and the need to proceed to the ER should they occur.  3. I discussed with the patient the risks and benefits of the procedure to include but not limited to: Infection, bleeding, damage to surrounding structures, possible need for further surgery, possible nerve pain, and possible recurrence. The patient was understanding and  wishes to proceed.

## 2018-01-23 NOTE — H&P (Signed)
istory of Present Illness Ralene Ok MD; 01/23/2018 2:09 PM) The patient is a 62 year old male who presents with an inguinal hernia. Referred by: Derald Macleod, NP Chief Complaint: Left inguinal hernia  Patient is a 62 year old male with a history of end-stage renal disease on peritoneal dialysis. Patient states she's had a left inguinal hernia for the last 3-6 months. He states that is bothersome when he which wears tighter clothing. He states that he is able to reduce it when he lays down. The patient's had no signs or symptoms of incarceration or strangulation. Patient does perineal dialysis daily. He has a left lower quadrant perineal dialysis catheter in place. He's had no previous abdominal surgeries.    Past Surgical History Alean Rinne, [place]; 01/23/2018 1:52 PM) Dialysis Shunt / Fistula   Allergies Alean Rinne, [place]; 01/23/2018 1:51 PM) No Known Drug Allergies [01/23/2018]: (Marked as Inactive) Penicillins  Allergies Reconciled   Medication History Alean Rinne, RMA; 01/23/2018 1:52 PM) Fluticasone Propionate (50MCG/ACT Suspension, Nasal) Active. Sevelamer Carbonate (800MG  Tablet, Oral) Active. Pantoprazole Sodium (40MG  Tablet DR, Oral) Active. Metoprolol Tartrate (100MG  Tablet, Oral) Active. Meclizine HCl (12.5MG  Tablet, Oral) Active. LevETIRAcetam (250MG  Tablet, Oral) Active. HydrALAZINE HCl (100MG  Tablet, Oral) Active. CloNIDine (0.3MG /24HR Patch Weekly, Transdermal) Active. Ciprofloxacin HCl (500MG  Tablet, Oral) Active. AmLODIPine Besylate (10MG  Tablet, Oral) Active.  Social History Alean Rinne, [place]; 01/23/2018 1:52 PM) Alcohol use  Occasional alcohol use. No caffeine use  No drug use  Tobacco use  Former smoker.  Family History Alean Rinne, [place]; 01/23/2018 1:52 PM) Arthritis  Mother, Sister. Breast Cancer  Mother. Colon Polyps  Sister. Heart Disease  Mother. Hypertension  Mother, Sister.  Other Problems Alean Rinne,  [place]; 01/23/2018 1:52 PM) Back Pain  Chronic Renal Failure Syndrome  Diabetes Mellitus  Enlarged Prostate  High blood pressure  Prostate Cancer     Review of Systems Ralene Ok MD; 01/23/2018 2:05 PM) General Present- Appetite Loss, Fatigue, Night Sweats and Weight Loss. Not Present- Chills, Fever and Weight Gain. Skin Present- Dryness. Not Present- Change in Wart/Mole, Hives, Jaundice, New Lesions, Non-Healing Wounds, Rash and Ulcer. HEENT Present- Visual Disturbances and Wears glasses/contact lenses. Not Present- Earache, Hearing Loss, Hoarseness, Nose Bleed, Oral Ulcers, Ringing in the Ears, Seasonal Allergies, Sinus Pain, Sore Throat and Yellow Eyes. Respiratory Present- Chronic Cough. Not Present- Bloody sputum, Difficulty Breathing, Snoring and Wheezing. Cardiovascular Not Present- Chest Pain, Difficulty Breathing Lying Down, Leg Cramps, Palpitations, Rapid Heart Rate, Shortness of Breath and Swelling of Extremities. Gastrointestinal Present- Bloating, Change in Bowel Habits, Constipation, Excessive gas, Gets full quickly at meals and Vomiting. Not Present- Abdominal Pain, Bloody Stool, Chronic diarrhea, Difficulty Swallowing, Hemorrhoids, Indigestion, Nausea and Rectal Pain. Male Genitourinary Not Present- Blood in Urine, Change in Urinary Stream, Frequency, Impotence, Nocturia, Painful Urination, Urgency and Urine Leakage. Musculoskeletal Not Present- Back Pain, Joint Pain, Joint Stiffness, Muscle Pain, Muscle Weakness and Swelling of Extremities. Neurological Not Present- Decreased Memory, Fainting, Headaches, Numbness, Seizures, Tingling, Tremor, Trouble walking and Weakness. Psychiatric Present- Change in Sleep Pattern. Not Present- Anxiety, Bipolar, Depression, Fearful and Frequent crying. Endocrine Present- Cold Intolerance, Hair Changes and Hot flashes. Not Present- Excessive Hunger, Heat Intolerance and New Diabetes. Hematology Present- Blood Thinners. Not Present- Easy  Bruising, Excessive bleeding, Gland problems, HIV and Persistent Infections. All other systems negative  Vitals Alean Rinne RMA; 01/23/2018 1:51 PM) 01/23/2018 1:50 PM Weight: 162.2 lb Height: 67in Body Surface Area: 1.85 m Body Mass Index: 25.4 kg/m  Temp.: 97.104F  Pulse: 71 (Regular)  BP: 110/78 (Sitting, Left Arm, Standard)       Physical Exam Ralene Ok MD; 01/23/2018 2:09 PM) The physical exam findings are as follows: Note:Constitutional: No acute distress, conversant, appears stated age  Eyes: Anicteric sclerae, moist conjunctiva, no lid lag  Neck: No thyromegaly, trachea midline, no cervical lymphadenopathy  Lungs: Clear to auscultation biilaterally, normal respiratory effot  Cardiovascular: regular rate & rhythm, no murmurs, no peripheal edema, pedal pulses 2+  GI: Soft, no masses or hepatosplenomegaly, non-tender to palpation  MSK: Normal gait, no clubbing cyanosis, edema  Skin: No rashes, palpation reveals normal skin turgor  Psychiatric: Appropriate judgment and insight, oriented to person, place, and time  Abdomen Inspection  Inspection of the abdomen reveals: Note: Left lower quadrant peritoneal dialysis catheter. Hernias - Inguinal hernia - Left - Reducible.    Assessment & Plan Ralene Ok MD; 01/23/2018 2:10 PM) LEFT INGUINAL HERNIA (K40.90) Impression: 62 year old male with a left inguinal hernia.  1. The patient will like to proceed to the operating room for laparoscopic left inguinal hernia repair with mesh.  2. I discussed with the patient the signs and symptoms of incarceration and strangulation and the need to proceed to the ER should they occur.  3. I discussed with the patient the risks and benefits of the procedure to include but not limited to: Infection, bleeding, damage to surrounding structures, possible need for further surgery, possible nerve pain, and possible recurrence. The patient was understanding and  wishes to proceed.

## 2018-01-24 DIAGNOSIS — E440 Moderate protein-calorie malnutrition: Secondary | ICD-10-CM | POA: Diagnosis not present

## 2018-01-24 DIAGNOSIS — D631 Anemia in chronic kidney disease: Secondary | ICD-10-CM | POA: Diagnosis not present

## 2018-01-24 DIAGNOSIS — K769 Liver disease, unspecified: Secondary | ICD-10-CM | POA: Diagnosis not present

## 2018-01-24 DIAGNOSIS — N2581 Secondary hyperparathyroidism of renal origin: Secondary | ICD-10-CM | POA: Diagnosis not present

## 2018-01-24 DIAGNOSIS — N186 End stage renal disease: Secondary | ICD-10-CM | POA: Diagnosis not present

## 2018-01-24 DIAGNOSIS — K650 Generalized (acute) peritonitis: Secondary | ICD-10-CM | POA: Diagnosis not present

## 2018-01-25 DIAGNOSIS — Z79899 Other long term (current) drug therapy: Secondary | ICD-10-CM | POA: Diagnosis not present

## 2018-01-25 DIAGNOSIS — D509 Iron deficiency anemia, unspecified: Secondary | ICD-10-CM | POA: Diagnosis not present

## 2018-01-25 DIAGNOSIS — D631 Anemia in chronic kidney disease: Secondary | ICD-10-CM | POA: Diagnosis not present

## 2018-01-25 DIAGNOSIS — Z5189 Encounter for other specified aftercare: Secondary | ICD-10-CM | POA: Diagnosis not present

## 2018-01-25 DIAGNOSIS — K769 Liver disease, unspecified: Secondary | ICD-10-CM | POA: Diagnosis not present

## 2018-01-25 DIAGNOSIS — N186 End stage renal disease: Secondary | ICD-10-CM | POA: Diagnosis not present

## 2018-01-25 DIAGNOSIS — Z23 Encounter for immunization: Secondary | ICD-10-CM | POA: Diagnosis not present

## 2018-01-25 DIAGNOSIS — K650 Generalized (acute) peritonitis: Secondary | ICD-10-CM | POA: Diagnosis not present

## 2018-01-25 DIAGNOSIS — I129 Hypertensive chronic kidney disease with stage 1 through stage 4 chronic kidney disease, or unspecified chronic kidney disease: Secondary | ICD-10-CM | POA: Diagnosis not present

## 2018-01-25 DIAGNOSIS — Z992 Dependence on renal dialysis: Secondary | ICD-10-CM | POA: Diagnosis not present

## 2018-01-25 DIAGNOSIS — E440 Moderate protein-calorie malnutrition: Secondary | ICD-10-CM | POA: Diagnosis not present

## 2018-01-25 DIAGNOSIS — E876 Hypokalemia: Secondary | ICD-10-CM | POA: Diagnosis not present

## 2018-01-25 DIAGNOSIS — N2581 Secondary hyperparathyroidism of renal origin: Secondary | ICD-10-CM | POA: Diagnosis not present

## 2018-01-26 DIAGNOSIS — K650 Generalized (acute) peritonitis: Secondary | ICD-10-CM | POA: Diagnosis not present

## 2018-01-26 DIAGNOSIS — Z79899 Other long term (current) drug therapy: Secondary | ICD-10-CM | POA: Diagnosis not present

## 2018-01-26 DIAGNOSIS — Z5189 Encounter for other specified aftercare: Secondary | ICD-10-CM | POA: Diagnosis not present

## 2018-01-26 DIAGNOSIS — D631 Anemia in chronic kidney disease: Secondary | ICD-10-CM | POA: Diagnosis not present

## 2018-01-26 DIAGNOSIS — E876 Hypokalemia: Secondary | ICD-10-CM | POA: Diagnosis not present

## 2018-01-26 DIAGNOSIS — N186 End stage renal disease: Secondary | ICD-10-CM | POA: Diagnosis not present

## 2018-01-27 DIAGNOSIS — Z5189 Encounter for other specified aftercare: Secondary | ICD-10-CM | POA: Diagnosis not present

## 2018-01-27 DIAGNOSIS — E876 Hypokalemia: Secondary | ICD-10-CM | POA: Diagnosis not present

## 2018-01-27 DIAGNOSIS — D631 Anemia in chronic kidney disease: Secondary | ICD-10-CM | POA: Diagnosis not present

## 2018-01-27 DIAGNOSIS — Z79899 Other long term (current) drug therapy: Secondary | ICD-10-CM | POA: Diagnosis not present

## 2018-01-27 DIAGNOSIS — N186 End stage renal disease: Secondary | ICD-10-CM | POA: Diagnosis not present

## 2018-01-27 DIAGNOSIS — K650 Generalized (acute) peritonitis: Secondary | ICD-10-CM | POA: Diagnosis not present

## 2018-01-28 DIAGNOSIS — Z79899 Other long term (current) drug therapy: Secondary | ICD-10-CM | POA: Diagnosis not present

## 2018-01-28 DIAGNOSIS — D631 Anemia in chronic kidney disease: Secondary | ICD-10-CM | POA: Diagnosis not present

## 2018-01-28 DIAGNOSIS — E876 Hypokalemia: Secondary | ICD-10-CM | POA: Diagnosis not present

## 2018-01-28 DIAGNOSIS — N186 End stage renal disease: Secondary | ICD-10-CM | POA: Diagnosis not present

## 2018-01-28 DIAGNOSIS — K650 Generalized (acute) peritonitis: Secondary | ICD-10-CM | POA: Diagnosis not present

## 2018-01-28 DIAGNOSIS — Z5189 Encounter for other specified aftercare: Secondary | ICD-10-CM | POA: Diagnosis not present

## 2018-01-29 ENCOUNTER — Ambulatory Visit: Payer: Medicare Other | Admitting: Family Medicine

## 2018-01-29 DIAGNOSIS — N186 End stage renal disease: Secondary | ICD-10-CM | POA: Diagnosis not present

## 2018-01-29 DIAGNOSIS — E876 Hypokalemia: Secondary | ICD-10-CM | POA: Diagnosis not present

## 2018-01-29 DIAGNOSIS — Z5189 Encounter for other specified aftercare: Secondary | ICD-10-CM | POA: Diagnosis not present

## 2018-01-29 DIAGNOSIS — Z79899 Other long term (current) drug therapy: Secondary | ICD-10-CM | POA: Diagnosis not present

## 2018-01-29 DIAGNOSIS — K650 Generalized (acute) peritonitis: Secondary | ICD-10-CM | POA: Diagnosis not present

## 2018-01-29 DIAGNOSIS — D631 Anemia in chronic kidney disease: Secondary | ICD-10-CM | POA: Diagnosis not present

## 2018-01-29 DIAGNOSIS — R82998 Other abnormal findings in urine: Secondary | ICD-10-CM | POA: Diagnosis not present

## 2018-01-30 ENCOUNTER — Ambulatory Visit: Payer: Medicare Other | Admitting: Family Medicine

## 2018-01-30 DIAGNOSIS — E876 Hypokalemia: Secondary | ICD-10-CM | POA: Diagnosis not present

## 2018-01-30 DIAGNOSIS — D631 Anemia in chronic kidney disease: Secondary | ICD-10-CM | POA: Diagnosis not present

## 2018-01-30 DIAGNOSIS — N186 End stage renal disease: Secondary | ICD-10-CM | POA: Diagnosis not present

## 2018-01-30 DIAGNOSIS — Z5189 Encounter for other specified aftercare: Secondary | ICD-10-CM | POA: Diagnosis not present

## 2018-01-30 DIAGNOSIS — K650 Generalized (acute) peritonitis: Secondary | ICD-10-CM | POA: Diagnosis not present

## 2018-01-30 DIAGNOSIS — Z79899 Other long term (current) drug therapy: Secondary | ICD-10-CM | POA: Diagnosis not present

## 2018-01-31 DIAGNOSIS — E876 Hypokalemia: Secondary | ICD-10-CM | POA: Diagnosis not present

## 2018-01-31 DIAGNOSIS — K650 Generalized (acute) peritonitis: Secondary | ICD-10-CM | POA: Diagnosis not present

## 2018-01-31 DIAGNOSIS — N186 End stage renal disease: Secondary | ICD-10-CM | POA: Diagnosis not present

## 2018-01-31 DIAGNOSIS — Z5189 Encounter for other specified aftercare: Secondary | ICD-10-CM | POA: Diagnosis not present

## 2018-01-31 DIAGNOSIS — D631 Anemia in chronic kidney disease: Secondary | ICD-10-CM | POA: Diagnosis not present

## 2018-01-31 DIAGNOSIS — Z79899 Other long term (current) drug therapy: Secondary | ICD-10-CM | POA: Diagnosis not present

## 2018-02-01 ENCOUNTER — Other Ambulatory Visit: Payer: Self-pay | Admitting: Family Medicine

## 2018-02-01 DIAGNOSIS — K650 Generalized (acute) peritonitis: Secondary | ICD-10-CM | POA: Diagnosis not present

## 2018-02-01 DIAGNOSIS — N186 End stage renal disease: Secondary | ICD-10-CM | POA: Diagnosis not present

## 2018-02-01 DIAGNOSIS — E876 Hypokalemia: Secondary | ICD-10-CM | POA: Diagnosis not present

## 2018-02-01 DIAGNOSIS — D631 Anemia in chronic kidney disease: Secondary | ICD-10-CM | POA: Diagnosis not present

## 2018-02-01 DIAGNOSIS — Z79899 Other long term (current) drug therapy: Secondary | ICD-10-CM | POA: Diagnosis not present

## 2018-02-01 DIAGNOSIS — Z5189 Encounter for other specified aftercare: Secondary | ICD-10-CM | POA: Diagnosis not present

## 2018-02-01 NOTE — Telephone Encounter (Signed)
Is this ok to fill? 

## 2018-02-01 NOTE — Telephone Encounter (Signed)
ok 

## 2018-02-02 DIAGNOSIS — D631 Anemia in chronic kidney disease: Secondary | ICD-10-CM | POA: Diagnosis not present

## 2018-02-02 DIAGNOSIS — Z79899 Other long term (current) drug therapy: Secondary | ICD-10-CM | POA: Diagnosis not present

## 2018-02-02 DIAGNOSIS — E876 Hypokalemia: Secondary | ICD-10-CM | POA: Diagnosis not present

## 2018-02-02 DIAGNOSIS — K650 Generalized (acute) peritonitis: Secondary | ICD-10-CM | POA: Diagnosis not present

## 2018-02-02 DIAGNOSIS — N186 End stage renal disease: Secondary | ICD-10-CM | POA: Diagnosis not present

## 2018-02-02 DIAGNOSIS — Z5189 Encounter for other specified aftercare: Secondary | ICD-10-CM | POA: Diagnosis not present

## 2018-02-03 DIAGNOSIS — Z5189 Encounter for other specified aftercare: Secondary | ICD-10-CM | POA: Diagnosis not present

## 2018-02-03 DIAGNOSIS — D631 Anemia in chronic kidney disease: Secondary | ICD-10-CM | POA: Diagnosis not present

## 2018-02-03 DIAGNOSIS — Z79899 Other long term (current) drug therapy: Secondary | ICD-10-CM | POA: Diagnosis not present

## 2018-02-03 DIAGNOSIS — N186 End stage renal disease: Secondary | ICD-10-CM | POA: Diagnosis not present

## 2018-02-03 DIAGNOSIS — K650 Generalized (acute) peritonitis: Secondary | ICD-10-CM | POA: Diagnosis not present

## 2018-02-03 DIAGNOSIS — E876 Hypokalemia: Secondary | ICD-10-CM | POA: Diagnosis not present

## 2018-02-04 ENCOUNTER — Ambulatory Visit: Payer: Medicare Other | Admitting: Family Medicine

## 2018-02-04 DIAGNOSIS — N186 End stage renal disease: Secondary | ICD-10-CM | POA: Diagnosis not present

## 2018-02-04 DIAGNOSIS — D631 Anemia in chronic kidney disease: Secondary | ICD-10-CM | POA: Diagnosis not present

## 2018-02-04 DIAGNOSIS — Z79899 Other long term (current) drug therapy: Secondary | ICD-10-CM | POA: Diagnosis not present

## 2018-02-04 DIAGNOSIS — Z5189 Encounter for other specified aftercare: Secondary | ICD-10-CM | POA: Diagnosis not present

## 2018-02-04 DIAGNOSIS — E876 Hypokalemia: Secondary | ICD-10-CM | POA: Diagnosis not present

## 2018-02-04 DIAGNOSIS — K650 Generalized (acute) peritonitis: Secondary | ICD-10-CM | POA: Diagnosis not present

## 2018-02-05 ENCOUNTER — Ambulatory Visit (INDEPENDENT_AMBULATORY_CARE_PROVIDER_SITE_OTHER): Payer: Medicare Other | Admitting: Neurology

## 2018-02-05 ENCOUNTER — Encounter: Payer: Self-pay | Admitting: Neurology

## 2018-02-05 VITALS — BP 125/66 | HR 73 | Wt 167.2 lb

## 2018-02-05 DIAGNOSIS — E876 Hypokalemia: Secondary | ICD-10-CM | POA: Diagnosis not present

## 2018-02-05 DIAGNOSIS — N186 End stage renal disease: Secondary | ICD-10-CM | POA: Diagnosis not present

## 2018-02-05 DIAGNOSIS — Z79899 Other long term (current) drug therapy: Secondary | ICD-10-CM | POA: Diagnosis not present

## 2018-02-05 DIAGNOSIS — K650 Generalized (acute) peritonitis: Secondary | ICD-10-CM | POA: Diagnosis not present

## 2018-02-05 DIAGNOSIS — R0989 Other specified symptoms and signs involving the circulatory and respiratory systems: Secondary | ICD-10-CM

## 2018-02-05 DIAGNOSIS — D631 Anemia in chronic kidney disease: Secondary | ICD-10-CM | POA: Diagnosis not present

## 2018-02-05 DIAGNOSIS — Z5189 Encounter for other specified aftercare: Secondary | ICD-10-CM | POA: Diagnosis not present

## 2018-02-05 NOTE — Patient Instructions (Signed)
I had a long discussion the patient with regard to his seizure like episode in the hospital. I recommend he taper the Keppra to 250 mg daily for one week and discontinue it. Check EEG. He also has left carotid bruit hence we'll check carotid ultrasound study. Return for follow-up in 6 months with nurse practitioner Megan or call earlier if necessary.

## 2018-02-05 NOTE — Progress Notes (Signed)
[name] [address] [place]. [place] [address] [phone]       OFFICE FOLLOW UP VISIT NOTE  Jared. Jared [name] Date of Birth:  [birth year] Medical Record Number:  [id number]   Referring MD:  [name]  Reason for Referral:  Seizure HPI: Initial Consult : Jared [name] is a 54 year African-American male who had a episode of witnessed seizure in the hospital when he was admitted recently.Jared [name] is an 62 y.o. male who presents to the ED via EMS after sudden onset of unresponsiveness at home.   His first symptom was severe headache, unknown if lateralized. Symptoms progressed to include right sided tingling, RUE jerking, leaning to the right and aphasia. He then, per family, leaned forward in his chair without complete loss of postural tone and started "jerking all over". At the time of EMS arrival, the jerking had stopped. On arrival to the ED, he began seizing. Seizure was generalized, with eyes deviated to the right and right > left jerking movements. Seizure was aborted with 2 mg IV Ativan.  CBG was 700 on arrival. Severely hypertensive and tachycardic as well.  Brief Neurological exam performed after Ativan administration and prior to intubation (premedicated with Etomidate 20mg  IV andRocuronium 80 mg IV, followed by propofol gtt at 10 mcg/kg/min)  Bu neurohospitalist Dr [name] revealed decreased tone in all 4 extremities with slightly more movement on the right relative to the left with noxious stimuli. Pupils were pinpoint and unreactive. Neck was supple. Sonorous breathing. Unarousable with sternal rub. Eyes were closed but remained partially open after eyelids manually raised. No doll's eye reflex was present. Reflexes were hypoactive x 4 with mute plantars.  Patient did not abuse alcohol or benzodiazepines per family. EEG was abnormal but showed only mild generalized nonspecific slowing without definite epileptiform activity. MRI scan the brain  did not show any acute infarct or other pathology. Patient was started on Keppra and is seizure was felt to be symptomatic from significantly elevated blood sugar of 700. Patient was initially started on Versed drip which was tapered. Patient was advised to taper his Keppra over a week which she has done. He has had no further recurrent seizures. 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 Patient was asked not to drive till he saw me but still has been doing some limited driving without any events. He has no prior history of seizures ,significant head injury with loss of consciousness. He denies drinking significant amounts of alcohol or doing any street drugs. There is no family history of epilepsy. Update 12/04/2017 Jared Givens, NP ) He returns today after hospitalization.  The patient was recently hospitalized for metabolic encephalopathy.  He presented to the emergency room with hypertension and blood sugar greater than 1000.  The patient is also end-stage renal disease.  The patient had a questionable seizure event in the hospital.  He was evaluated by the hospital neurologist who recommended restarting Keppra 250 mg twice a day with the plan of eventually tapering off this medication in the future.  It was felt that the patient's seizure event was caused by encephalopathy r/t to hypertension and hyperglycemia.  The patient reports no additional seizure activity.  He has actually only been taking Keppra 250 mg daily.  He returns today for an evaluation.  Update 02/05/2018 : He returns for follow-up after last visit with Gerilyn Pilgrim nurse practitioner 2 months ago. He continues to do well without any  recurrent seizure episodes. He has reduced the dose of Keppra to 250 twice daily. He has a left groin hernia and plans to undergo surgery on 02/19/18. Patient has no new complaints today. ROS:   14 system review of systems is positive for ringing  in the ears, cough, restless leg, weakness and all other systems negative PMH:  Past Medical History:  Diagnosis Date  . Anemia   . Chronic kidney disease    on dialysis M-W-F  . Diabetes mellitus without complication ([place])    onset as adult  . Hypertension   . Seizures ([place])   . Stroke ([place])   . Thyroid disease    hyperparathyroidism    Social History:  Social History   Socioeconomic History  . Marital status: Divorced    Spouse name: Not on file  . Number of children: Not on file  . Years of education: Not on file  . Highest education level: Not on file  Social Needs  . Financial resource strain: Not on file  . Food insecurity - worry: Not on file  . Food insecurity - inability: Not on file  . Transportation needs - medical: Not on file  . Transportation needs - non-medical: Not on file  Occupational History  . Not on file  Tobacco Use  . Smoking status: Former Smoker    Last attempt to quit: 05/11/1984    Years since quitting: 33.7  . Smokeless tobacco: Never Used  Substance and Sexual Activity  . Alcohol use: Yes    Alcohol/week: 0.6 oz    Types: 1 Glasses of wine per week    Comment: occasional  . Drug use: No  . Sexual activity: Not on file  Other Topics Concern  . Not on file  Social History Narrative  . Not on file    Medications:   Current Outpatient Medications on File Prior to Visit  Medication Sig Dispense Refill  . amLODipine (NORVASC) 10 MG tablet Take 1 tablet (10 mg total) by mouth daily. 30 tablet 0  . atorvastatin (LIPITOR) 20 MG tablet Take 1 tablet (20 mg total) by mouth daily. 90 tablet 3  . calcitRIOL (ROCALTROL) 0.25 MCG capsule Take 0.25 mcg by mouth daily.    . cloNIDine (CATAPRES - DOSED IN MG/24 HR) 0.3 mg/24hr patch Place 1 patch (0.3 mg total) onto the skin once a week. 4 patch 12  . docusate sodium (COLACE) 100 MG capsule Take 1 capsule (100 mg total) by mouth every 12 (twelve) hours. (Patient taking differently: Take 100 mg by  mouth daily as needed for mild constipation. ) 60 capsule 0  . fluticasone (FLONASE) 50 MCG/ACT nasal spray Place 1 spray into both nostrils daily. (Patient taking differently: Place 1 spray into both nostrils daily as needed for allergies. ) 16 g 2  . hydrALAZINE (APRESOLINE) 100 MG tablet Take 1 tablet (100 mg total) by mouth 3 (three) times daily. 90 tablet 0  . insulin NPH Human (NOVOLIN N) 100 UNIT/ML injection Inject 20 units every morning (Patient taking differently: Inject 30 Units into the skin daily before breakfast. ) 10 mL 5  . isosorbide mononitrate (IMDUR) 30 MG 24 hr tablet TAKE 1 TABLET BY MOUTH ONCE DAILY 90 tablet 1  . levETIRAcetam (KEPPRA) 250 MG tablet Take 1 tablet (250 mg total) by mouth 2 (two) times daily. 60 tablet 5  . loratadine (CLARITIN) 10 MG tablet Take 1 tablet (10 mg total) by mouth daily. 30 tablet 0  . meclizine (ANTIVERT)  12.5 MG tablet Take 1 tablet (12.5 mg total) by mouth 3 (three) times daily as needed for dizziness. 30 tablet 0  . metoprolol tartrate (LOPRESSOR) 100 MG tablet Take 1 tablet (100 mg total) by mouth 2 (two) times daily. 60 tablet 1  . oxyCODONE-acetaminophen (PERCOCET) 5-325 MG tablet Take 1-2 tablets by mouth every 4 (four) hours as needed. 20 tablet 0  . pantoprazole (PROTONIX) 40 MG tablet TAKE 1 TABLET BY MOUTH ONCE DAILY AT 6AM 90 tablet 1  . potassium chloride SA (K-DUR,KLOR-CON) 20 MEQ tablet Take 20 mEq by mouth daily.    . sevelamer carbonate (RENVELA) 800 MG tablet Take 3 tablets (2,400 mg total) by mouth 3 (three) times daily with meals. 240 tablet 1   No current facility-administered medications on file prior to visit.     Allergies:   Allergies  Allergen Reactions  . Penicillins Other (See Comments)    Unknown reaction Has patient had a PCN reaction causing immediate rash, facial/tongue/throat swelling, SOB or lightheadedness with hypotension: Unk Has patient had a PCN reaction causing severe rash involving mucus membranes or  skin necrosis: Unk Has patient had a PCN reaction that required hospitalization: Unk Has patient had a PCN reaction occurring within the last 10 years: Unk If all of the above answers are "NO", then may proceed with Cephalosporin use.     Physical Exam General: Frail middle-aged African-American male, seated, in no evident distress Head: head normocephalic and atraumatic.   Neck: supple with no carotid or supraclavicular bruits Cardiovascular: regular rate and rhythm, no murmurs Musculoskeletal: no deformity Skin:  no rash/petichiae Vascular:  Normal pulses all extremities  Neurologic Exam Mental Status: Awake and fully alert. Oriented to place and time. Recent and remote memory intact. Attention span, concentration and fund of knowledge appropriate. Mood and affect appropriate.   Cranial Nerves: Fundoscopic exam reveals sharp disc margins. Pupils equal, briskly reactive to light. Extraocular movements full without nystagmus. Visual fields full to confrontation. Hearing intact. Facial sensation intact. Face, tongue, palate moves normally and symmetrically.  Motor: Normal bulk and tone. Normal strength in all tested extremity muscles. Sensory.: intact to touch , pinprick , position and vibratory sensation.  Coordination: Rapid alternating movements normal in all extremities. Finger-to-nose and heel-to-shin performed accurately bilaterally. Gait and Station: Arises from chair without difficulty. Stance is normal. Gait demonstrates normal stride length and balance . Able to heel, toe and tandem walk without difficulty.  Reflexes: 1+ and symmetric. Toes downgoing.      ASSESSMENT: 62 year old patient with the solitary seizure provoked by hyperglycemia with normal brain imaging studies and nonspecific EEG findings.    PLAN: I had a long discussion the patient  regarding his solitary provoked seizure in the setting of significant hyperglycemia. In the presence of normal  Brain MRI,EEG and  in presence of a reversible acute medical condition I agree that he does not need long-term anticonvulsants at the present time. I recommend he taper the Keppra to 250 mg daily for one week and discontinue it. Check EEG. He also has left carotid bruit hence we'll check carotid ultrasound study. Return for follow-up in 6 months with nurse practitioner Megan or call earlier if necessary.If the patient has any seizure-like events he was advised to let us know. Greater than 50% time during this 25 minute  visit was spent on counseling and coordination of care about seizures, provoking triggers and answering questions  Antony Contras, MD  [facility] [place],  [place] [address]  Phone [phone] Fax [phone] Note: This document was prepared with digital dictation and possible smart phrase technology. Any transcriptional errors that result from this process are unintentional.

## 2018-02-06 DIAGNOSIS — Z5189 Encounter for other specified aftercare: Secondary | ICD-10-CM | POA: Diagnosis not present

## 2018-02-06 DIAGNOSIS — N186 End stage renal disease: Secondary | ICD-10-CM | POA: Diagnosis not present

## 2018-02-06 DIAGNOSIS — E876 Hypokalemia: Secondary | ICD-10-CM | POA: Diagnosis not present

## 2018-02-06 DIAGNOSIS — K650 Generalized (acute) peritonitis: Secondary | ICD-10-CM | POA: Diagnosis not present

## 2018-02-06 DIAGNOSIS — Z79899 Other long term (current) drug therapy: Secondary | ICD-10-CM | POA: Diagnosis not present

## 2018-02-06 DIAGNOSIS — C61 Malignant neoplasm of prostate: Secondary | ICD-10-CM | POA: Diagnosis not present

## 2018-02-06 DIAGNOSIS — D631 Anemia in chronic kidney disease: Secondary | ICD-10-CM | POA: Diagnosis not present

## 2018-02-07 DIAGNOSIS — K650 Generalized (acute) peritonitis: Secondary | ICD-10-CM | POA: Diagnosis not present

## 2018-02-07 DIAGNOSIS — E876 Hypokalemia: Secondary | ICD-10-CM | POA: Diagnosis not present

## 2018-02-07 DIAGNOSIS — N186 End stage renal disease: Secondary | ICD-10-CM | POA: Diagnosis not present

## 2018-02-07 DIAGNOSIS — D631 Anemia in chronic kidney disease: Secondary | ICD-10-CM | POA: Diagnosis not present

## 2018-02-07 DIAGNOSIS — Z5189 Encounter for other specified aftercare: Secondary | ICD-10-CM | POA: Diagnosis not present

## 2018-02-07 DIAGNOSIS — Z79899 Other long term (current) drug therapy: Secondary | ICD-10-CM | POA: Diagnosis not present

## 2018-02-08 DIAGNOSIS — K650 Generalized (acute) peritonitis: Secondary | ICD-10-CM | POA: Diagnosis not present

## 2018-02-08 DIAGNOSIS — Z5189 Encounter for other specified aftercare: Secondary | ICD-10-CM | POA: Diagnosis not present

## 2018-02-08 DIAGNOSIS — D631 Anemia in chronic kidney disease: Secondary | ICD-10-CM | POA: Diagnosis not present

## 2018-02-08 DIAGNOSIS — E876 Hypokalemia: Secondary | ICD-10-CM | POA: Diagnosis not present

## 2018-02-08 DIAGNOSIS — N186 End stage renal disease: Secondary | ICD-10-CM | POA: Diagnosis not present

## 2018-02-08 DIAGNOSIS — Z79899 Other long term (current) drug therapy: Secondary | ICD-10-CM | POA: Diagnosis not present

## 2018-02-09 DIAGNOSIS — K650 Generalized (acute) peritonitis: Secondary | ICD-10-CM | POA: Diagnosis not present

## 2018-02-09 DIAGNOSIS — N186 End stage renal disease: Secondary | ICD-10-CM | POA: Diagnosis not present

## 2018-02-09 DIAGNOSIS — Z5189 Encounter for other specified aftercare: Secondary | ICD-10-CM | POA: Diagnosis not present

## 2018-02-09 DIAGNOSIS — Z79899 Other long term (current) drug therapy: Secondary | ICD-10-CM | POA: Diagnosis not present

## 2018-02-09 DIAGNOSIS — E876 Hypokalemia: Secondary | ICD-10-CM | POA: Diagnosis not present

## 2018-02-09 DIAGNOSIS — D631 Anemia in chronic kidney disease: Secondary | ICD-10-CM | POA: Diagnosis not present

## 2018-02-10 DIAGNOSIS — E876 Hypokalemia: Secondary | ICD-10-CM | POA: Diagnosis not present

## 2018-02-10 DIAGNOSIS — N186 End stage renal disease: Secondary | ICD-10-CM | POA: Diagnosis not present

## 2018-02-10 DIAGNOSIS — D631 Anemia in chronic kidney disease: Secondary | ICD-10-CM | POA: Diagnosis not present

## 2018-02-10 DIAGNOSIS — Z5189 Encounter for other specified aftercare: Secondary | ICD-10-CM | POA: Diagnosis not present

## 2018-02-10 DIAGNOSIS — K650 Generalized (acute) peritonitis: Secondary | ICD-10-CM | POA: Diagnosis not present

## 2018-02-10 DIAGNOSIS — Z79899 Other long term (current) drug therapy: Secondary | ICD-10-CM | POA: Diagnosis not present

## 2018-02-11 DIAGNOSIS — E876 Hypokalemia: Secondary | ICD-10-CM | POA: Diagnosis not present

## 2018-02-11 DIAGNOSIS — D631 Anemia in chronic kidney disease: Secondary | ICD-10-CM | POA: Diagnosis not present

## 2018-02-11 DIAGNOSIS — Z79899 Other long term (current) drug therapy: Secondary | ICD-10-CM | POA: Diagnosis not present

## 2018-02-11 DIAGNOSIS — K650 Generalized (acute) peritonitis: Secondary | ICD-10-CM | POA: Diagnosis not present

## 2018-02-11 DIAGNOSIS — Z5189 Encounter for other specified aftercare: Secondary | ICD-10-CM | POA: Diagnosis not present

## 2018-02-11 DIAGNOSIS — N186 End stage renal disease: Secondary | ICD-10-CM | POA: Diagnosis not present

## 2018-02-12 ENCOUNTER — Ambulatory Visit (HOSPITAL_COMMUNITY)
Admission: RE | Admit: 2018-02-12 | Discharge: 2018-02-12 | Disposition: A | Discharge status: Home / Self Care | Payer: Medicare Other | Source: Ambulatory Visit | Attending: Neurology | Admitting: Neurology

## 2018-02-12 ENCOUNTER — Encounter (HOSPITAL_COMMUNITY): Payer: Self-pay

## 2018-02-12 DIAGNOSIS — K650 Generalized (acute) peritonitis: Secondary | ICD-10-CM | POA: Diagnosis not present

## 2018-02-12 DIAGNOSIS — Z79899 Other long term (current) drug therapy: Secondary | ICD-10-CM | POA: Diagnosis not present

## 2018-02-12 DIAGNOSIS — R0989 Other specified symptoms and signs involving the circulatory and respiratory systems: Secondary | ICD-10-CM | POA: Insufficient documentation

## 2018-02-12 DIAGNOSIS — E876 Hypokalemia: Secondary | ICD-10-CM | POA: Diagnosis not present

## 2018-02-12 DIAGNOSIS — Z5189 Encounter for other specified aftercare: Secondary | ICD-10-CM | POA: Diagnosis not present

## 2018-02-12 DIAGNOSIS — D631 Anemia in chronic kidney disease: Secondary | ICD-10-CM | POA: Diagnosis not present

## 2018-02-12 DIAGNOSIS — N186 End stage renal disease: Secondary | ICD-10-CM | POA: Diagnosis not present

## 2018-02-12 DIAGNOSIS — C61 Malignant neoplasm of prostate: Secondary | ICD-10-CM | POA: Diagnosis not present

## 2018-02-12 NOTE — Pre-Procedure Instructions (Signed)
Jared [name]  02/12/2018      [place], [place] [address] [place] [place] [address] Phone: [phone] Fax: [phone]    Your procedure is scheduled on Tuesday March 26.  Report to [facility] Admitting at 7:00 A.M.  Call this number if you have problems the morning of surgery:  [phone]   Remember:  Do not eat food or drink liquids after midnight.  Take these medicines the morning of surgery with A SIP OF WATER:   Metoprolol (lopressor) Amlodipine (norvasc) Pantoprazole (protonix) Hydralazine (apresoline) Isosorbide (imdur) Levetiracetam (Keppra) Loratadine (Claritin) Oxycodone-acetaminophen (percocet) if needed flonase if needed  TAKE Half dose of NPH (Humulin N) insulin the morning of surgery (15 units)     How to Manage Your Diabetes Before and After Surgery  Why is it important to control my blood sugar before and after surgery? . Improving blood sugar levels before and after surgery helps healing and can limit problems. . A way of improving blood sugar control is eating a healthy diet by: o  Eating less sugar and carbohydrates o  Increasing activity/exercise o  Talking with your doctor about reaching your blood sugar goals . High blood sugars (greater than 180 mg/dL) can raise your risk of infections and slow your recovery, so you will need to focus on controlling your diabetes during the weeks before surgery. . Make sure that the doctor who takes care of your diabetes knows about your planned surgery including the date and location.  How do I manage my blood sugar before surgery? . Check your blood sugar at least 4 times a day, starting 2 days before surgery, to make sure that the level is not too high or low. o Check your blood sugar the morning of your surgery when you wake up and every 2 hours until you get to the Short Stay unit. . If your blood sugar is less than 70 mg/dL,  you will need to treat for low blood sugar: o Do not take insulin. o Treat a low blood sugar (less than 70 mg/dL) with  cup of clear juice (cranberry or apple), 4 glucose tablets, OR glucose gel. Recheck blood sugar in 15 minutes after treatment (to make sure it is greater than 70 mg/dL). If your blood sugar is not greater than 70 mg/dL on recheck, call [phone] o  for further instructions. . Report your blood sugar to the short stay nurse when you get to Short Stay.  . If you are admitted to the hospital after surgery: o Your blood sugar will be checked by the staff and you will probably be given insulin after surgery (instead of oral diabetes medicines) to make sure you have good blood sugar levels. o The goal for blood sugar control after surgery is 80-180 mg/dL.              Do not wear jewelry, make-up or nail polish.  Do not wear lotions, powders, or perfumes, or deodorant.  Do not shave 48 hours prior to surgery.  Men may shave face and neck.  Do not bring valuables to the hospital.  [facility] is not responsible for any belongings or valuables.  Contacts, dentures or bridgework may not be worn into surgery.  Leave your suitcase in the car.  After surgery it may be brought to your room.  For patients admitted to the hospital, discharge time will be determined by your treatment team.  Patients discharged  the day of surgery will not be allowed to drive home.   Special instructions:    [facility]- Preparing For Surgery  Before surgery, you can play an important role. Because skin is not sterile, your skin needs to be as free of germs as possible. You can reduce the number of germs on your skin by washing with CHG (chlorahexidine gluconate) Soap before surgery.  CHG is an antiseptic cleaner which kills germs and bonds with the skin to continue killing germs even after washing.  Please do not use if you have an allergy to CHG or antibacterial soaps. If your skin  becomes reddened/irritated stop using the CHG.  Do not shave (including legs and underarms) for at least 48 hours prior to first CHG shower. It is OK to shave your face.  Please follow these instructions carefully.   1. Shower the NIGHT BEFORE SURGERY and the MORNING OF SURGERY with CHG.   2. If you chose to wash your hair, wash your hair first as usual with your normal shampoo.  3. After you shampoo, rinse your hair and body thoroughly to remove the shampoo.  4. Use CHG as you would any other liquid soap. You can apply CHG directly to the skin and wash gently with a scrungie or a clean washcloth.   5. Apply the CHG Soap to your body ONLY FROM THE NECK DOWN.  Do not use on open wounds or open sores. Avoid contact with your eyes, ears, mouth and genitals (private parts). Wash Face and genitals (private parts)  with your normal soap.  6. Wash thoroughly, paying special attention to the area where your surgery will be performed.  7. Thoroughly rinse your body with warm water from the neck down.  8. DO NOT shower/wash with your normal soap after using and rinsing off the CHG Soap.  9. Pat yourself dry with a CLEAN TOWEL.  10. Wear CLEAN PAJAMAS to bed the night before surgery, wear comfortable clothes the morning of surgery  11. Place CLEAN SHEETS on your bed the night of your first shower and DO NOT SLEEP WITH PETS.    Day of Surgery: Do not apply any deodorants/lotions. Please wear clean clothes to the hospital/surgery center.      Please read over the following fact sheets that you were given. Coughing and Deep Breathing and Surgical Site Infection Prevention

## 2018-02-12 NOTE — Progress Notes (Addendum)
Carotid duplex prelim: no significant ICA stenosis.  Lesleigh Hughson Eunice, RDMS, RVT   

## 2018-02-13 ENCOUNTER — Encounter (HOSPITAL_COMMUNITY): Payer: Self-pay

## 2018-02-13 ENCOUNTER — Encounter (HOSPITAL_COMMUNITY)
Admission: RE | Admit: 2018-02-13 | Discharge: 2018-02-13 | Disposition: A | Discharge status: Home / Self Care | Payer: Medicare Other | Source: Ambulatory Visit | Attending: General Surgery | Admitting: General Surgery

## 2018-02-13 ENCOUNTER — Other Ambulatory Visit: Payer: Self-pay

## 2018-02-13 DIAGNOSIS — N186 End stage renal disease: Secondary | ICD-10-CM | POA: Diagnosis not present

## 2018-02-13 DIAGNOSIS — K4090 Unilateral inguinal hernia, without obstruction or gangrene, not specified as recurrent: Secondary | ICD-10-CM | POA: Insufficient documentation

## 2018-02-13 DIAGNOSIS — Z992 Dependence on renal dialysis: Secondary | ICD-10-CM | POA: Diagnosis not present

## 2018-02-13 DIAGNOSIS — Z01812 Encounter for preprocedural laboratory examination: Secondary | ICD-10-CM | POA: Diagnosis not present

## 2018-02-13 DIAGNOSIS — Z79899 Other long term (current) drug therapy: Secondary | ICD-10-CM | POA: Diagnosis not present

## 2018-02-13 DIAGNOSIS — Z5189 Encounter for other specified aftercare: Secondary | ICD-10-CM | POA: Diagnosis not present

## 2018-02-13 DIAGNOSIS — D631 Anemia in chronic kidney disease: Secondary | ICD-10-CM | POA: Diagnosis not present

## 2018-02-13 DIAGNOSIS — E876 Hypokalemia: Secondary | ICD-10-CM | POA: Diagnosis not present

## 2018-02-13 DIAGNOSIS — K650 Generalized (acute) peritonitis: Secondary | ICD-10-CM | POA: Diagnosis not present

## 2018-02-13 HISTORY — DX: Malignant (primary) neoplasm, unspecified: C80.1

## 2018-02-13 HISTORY — DX: Gastro-esophageal reflux disease without esophagitis: K21.9

## 2018-02-13 LAB — BASIC METABOLIC PANEL
ANION GAP: 12 (ref 5–15)
BUN: 36 mg/dL — ABNORMAL HIGH (ref 6–20)
CALCIUM: 8.8 mg/dL — AB (ref 8.9–10.3)
CO2: 24 mmol/L (ref 22–32)
Chloride: 99 mmol/L — ABNORMAL LOW (ref 101–111)
Creatinine, Ser: 8.67 mg/dL — ABNORMAL HIGH (ref 0.61–1.24)
GFR, EST AFRICAN AMERICAN: 7 mL/min — AB (ref >60)
GFR, EST NON AFRICAN AMERICAN: 6 mL/min — AB (ref >60)
GLUCOSE: 180 mg/dL — AB (ref 65–99)
POTASSIUM: 3.6 mmol/L (ref 3.5–5.1)
Sodium: 135 mmol/L (ref 135–145)

## 2018-02-13 LAB — CBC
HCT: 34.8 % — ABNORMAL LOW (ref 39.0–52.0)
Hemoglobin: 10.9 g/dL — ABNORMAL LOW (ref 13.0–17.0)
MCH: 29.5 pg (ref 26.0–34.0)
MCHC: 31.3 g/dL (ref 30.0–36.0)
MCV: 94.3 fL (ref 78.0–100.0)
PLATELETS: 153 10*3/uL (ref 150–400)
RBC: 3.69 MIL/uL — AB (ref 4.22–5.81)
RDW: 16.9 % — AB (ref 11.5–15.5)
WBC: 2.9 10*3/uL — AB (ref 4.0–10.5)

## 2018-02-13 LAB — HEMOGLOBIN A1C
Hgb A1c MFr Bld: 6.3 % — ABNORMAL HIGH (ref 4.8–5.6)
MEAN PLASMA GLUCOSE: 134.11 mg/dL

## 2018-02-13 LAB — GLUCOSE, CAPILLARY: GLUCOSE-CAPILLARY: 167 mg/dL — AB (ref 65–99)

## 2018-02-13 NOTE — Progress Notes (Signed)
PCP - Harland Dingwall Denies cardiologist or cardiac workup Nephrologist- Dr. Lorrene Reid  Chest x-ray - 11/24/17 EKG - 11/24/17 ECHO - 03/14/17  Fasting Blood Sugar - pt reports CBG usually between 100-200. Pt hospitalized in December for AMS/possible seizures d/t elevated CBG at 1100 per patient. Pt reports no seizures since then.   Pt does peritoneal dialysis at home, but has fistula to left arm.   Anesthesia review: recent hospitalization for elevated CBG and seizures?, review EKG  Patient denies shortness of breath, fever, cough and chest pain at PAT appointment   Patient verbalized understanding of instructions that were given to them at the PAT appointment. Patient was also instructed that they will need to review over the PAT instructions again at home before surgery.

## 2018-02-14 ENCOUNTER — Telehealth: Payer: Self-pay

## 2018-02-14 DIAGNOSIS — D631 Anemia in chronic kidney disease: Secondary | ICD-10-CM | POA: Diagnosis not present

## 2018-02-14 DIAGNOSIS — N186 End stage renal disease: Secondary | ICD-10-CM | POA: Diagnosis not present

## 2018-02-14 DIAGNOSIS — Z79899 Other long term (current) drug therapy: Secondary | ICD-10-CM | POA: Diagnosis not present

## 2018-02-14 DIAGNOSIS — Z5189 Encounter for other specified aftercare: Secondary | ICD-10-CM | POA: Diagnosis not present

## 2018-02-14 DIAGNOSIS — K650 Generalized (acute) peritonitis: Secondary | ICD-10-CM | POA: Diagnosis not present

## 2018-02-14 DIAGNOSIS — E876 Hypokalemia: Secondary | ICD-10-CM | POA: Diagnosis not present

## 2018-02-14 NOTE — Telephone Encounter (Signed)
Notes recorded by Marval Regal, RN on 02/14/2018 at 9:42 AM EDT Left vm for patient to call back about vas US carotid result

## 2018-02-14 NOTE — Telephone Encounter (Signed)
-----   Message from Garvin Fila, MD sent at 02/13/2018  7:44 PM EDT ----- Jared [name] inform patient that carotid doppler study was normal

## 2018-02-15 DIAGNOSIS — K650 Generalized (acute) peritonitis: Secondary | ICD-10-CM | POA: Diagnosis not present

## 2018-02-15 DIAGNOSIS — Z79899 Other long term (current) drug therapy: Secondary | ICD-10-CM | POA: Diagnosis not present

## 2018-02-15 DIAGNOSIS — E876 Hypokalemia: Secondary | ICD-10-CM | POA: Diagnosis not present

## 2018-02-15 DIAGNOSIS — Z5189 Encounter for other specified aftercare: Secondary | ICD-10-CM | POA: Diagnosis not present

## 2018-02-15 DIAGNOSIS — D631 Anemia in chronic kidney disease: Secondary | ICD-10-CM | POA: Diagnosis not present

## 2018-02-15 DIAGNOSIS — N186 End stage renal disease: Secondary | ICD-10-CM | POA: Diagnosis not present

## 2018-02-16 DIAGNOSIS — Z79899 Other long term (current) drug therapy: Secondary | ICD-10-CM | POA: Diagnosis not present

## 2018-02-16 DIAGNOSIS — K650 Generalized (acute) peritonitis: Secondary | ICD-10-CM | POA: Diagnosis not present

## 2018-02-16 DIAGNOSIS — N186 End stage renal disease: Secondary | ICD-10-CM | POA: Diagnosis not present

## 2018-02-16 DIAGNOSIS — Z5189 Encounter for other specified aftercare: Secondary | ICD-10-CM | POA: Diagnosis not present

## 2018-02-16 DIAGNOSIS — D631 Anemia in chronic kidney disease: Secondary | ICD-10-CM | POA: Diagnosis not present

## 2018-02-16 DIAGNOSIS — E876 Hypokalemia: Secondary | ICD-10-CM | POA: Diagnosis not present

## 2018-02-17 DIAGNOSIS — K650 Generalized (acute) peritonitis: Secondary | ICD-10-CM | POA: Diagnosis not present

## 2018-02-17 DIAGNOSIS — E876 Hypokalemia: Secondary | ICD-10-CM | POA: Diagnosis not present

## 2018-02-17 DIAGNOSIS — Z5189 Encounter for other specified aftercare: Secondary | ICD-10-CM | POA: Diagnosis not present

## 2018-02-17 DIAGNOSIS — Z79899 Other long term (current) drug therapy: Secondary | ICD-10-CM | POA: Diagnosis not present

## 2018-02-17 DIAGNOSIS — N186 End stage renal disease: Secondary | ICD-10-CM | POA: Diagnosis not present

## 2018-02-17 DIAGNOSIS — D631 Anemia in chronic kidney disease: Secondary | ICD-10-CM | POA: Diagnosis not present

## 2018-02-18 DIAGNOSIS — K650 Generalized (acute) peritonitis: Secondary | ICD-10-CM | POA: Diagnosis not present

## 2018-02-18 DIAGNOSIS — Z79899 Other long term (current) drug therapy: Secondary | ICD-10-CM | POA: Diagnosis not present

## 2018-02-18 DIAGNOSIS — N186 End stage renal disease: Secondary | ICD-10-CM | POA: Diagnosis not present

## 2018-02-18 DIAGNOSIS — E876 Hypokalemia: Secondary | ICD-10-CM | POA: Diagnosis not present

## 2018-02-18 DIAGNOSIS — Z5189 Encounter for other specified aftercare: Secondary | ICD-10-CM | POA: Diagnosis not present

## 2018-02-18 DIAGNOSIS — D631 Anemia in chronic kidney disease: Secondary | ICD-10-CM | POA: Diagnosis not present

## 2018-02-18 MED ORDER — VANCOMYCIN HCL 10 G IV SOLR
1500.0000 mg | Freq: Once | INTRAVENOUS | Status: AC
  Administered 2018-02-19: 1500 mg via INTRAVENOUS
  Filled 2018-02-18: qty 1500

## 2018-02-18 NOTE — Anesthesia Preprocedure Evaluation (Addendum)
Anesthesia Evaluation  Patient identified by MRN, date of birth, ID band Patient awake    Reviewed: Allergy & Precautions, H&P , NPO status , Patient's Chart, lab work & pertinent test results, reviewed documented beta blocker date and time   Airway Mallampati: II       Dental  (+) Edentulous Upper, Edentulous Lower   Pulmonary neg pulmonary ROS, former smoker,    Pulmonary exam normal        Cardiovascular Exercise Tolerance: Good hypertension, Pt. on medications and Pt. on home beta blockers negative cardio ROS Normal cardiovascular exam     Neuro/Psych CVA negative neurological ROS  negative psych ROS   GI/Hepatic negative GI ROS, Neg liver ROS, GERD  ,  Endo/Other  negative endocrine ROSdiabetes, Insulin Dependent  Renal/GU ESRF and DialysisRenal diseasenegative Renal ROS     Musculoskeletal   Abdominal   Peds  Hematology negative hematology ROS (+) anemia ,   Anesthesia Other Findings   Reproductive/Obstetrics negative OB ROS                              Lab Results  Component Value Date   CREATININE 8.67 (H) 02/13/2018   BUN 36 (H) 02/13/2018   NA 135 02/13/2018   K 3.6 02/13/2018   CL 99 (L) 02/13/2018   CO2 24 02/13/2018    Lab Results  Component Value Date   WBC 2.9 (L) 02/13/2018   HGB 10.9 (L) 02/13/2018   HCT 34.8 (L) 02/13/2018   MCV 94.3 02/13/2018   PLT 153 02/13/2018    Anesthesia Physical Anesthesia Plan  ASA: III  Anesthesia Plan: General   Post-op Pain Management:    Induction: Intravenous  PONV Risk Score and Plan: 3 and Ondansetron, Midazolam and Treatment may vary due to age or medical condition  Airway Management Planned: Oral ETT  Additional Equipment:   Intra-op Plan:   Post-operative Plan: Extubation in OR  Informed Consent: I have reviewed the patients History and Physical, chart, labs and discussed the procedure including the risks,  benefits and alternatives for the proposed anesthesia with the patient or authorized representative who has indicated his/her understanding and acceptance.   Dental advisory given  Plan Discussed with: CRNA  Anesthesia Plan Comments:         Anesthesia Quick Evaluation

## 2018-02-18 NOTE — Telephone Encounter (Signed)
Rn call McSwain,Queen patients sister on dpr. Rn stated her brothers vas US carotid was normal. Patient's sister verbalized understanding. Rn requested she give her the brother the results. ------

## 2018-02-19 ENCOUNTER — Ambulatory Visit (HOSPITAL_COMMUNITY): Payer: Medicare Other | Admitting: Emergency Medicine

## 2018-02-19 ENCOUNTER — Ambulatory Visit (HOSPITAL_COMMUNITY)
Admission: RE | Admit: 2018-02-19 | Discharge: 2018-02-19 | Disposition: A | Discharge status: Home / Self Care | Payer: Medicare Other | Source: Ambulatory Visit | Attending: General Surgery | Admitting: General Surgery

## 2018-02-19 ENCOUNTER — Ambulatory Visit (HOSPITAL_COMMUNITY): Payer: Medicare Other | Admitting: Certified Registered"

## 2018-02-19 ENCOUNTER — Encounter (HOSPITAL_COMMUNITY)
Admission: RE | Disposition: A | Discharge status: Home / Self Care | Payer: Self-pay | Source: Ambulatory Visit | Attending: General Surgery

## 2018-02-19 DIAGNOSIS — Z79899 Other long term (current) drug therapy: Secondary | ICD-10-CM | POA: Insufficient documentation

## 2018-02-19 DIAGNOSIS — Z5189 Encounter for other specified aftercare: Secondary | ICD-10-CM | POA: Diagnosis not present

## 2018-02-19 DIAGNOSIS — Z880 Allergy status to penicillin: Secondary | ICD-10-CM | POA: Insufficient documentation

## 2018-02-19 DIAGNOSIS — Z87891 Personal history of nicotine dependence: Secondary | ICD-10-CM | POA: Diagnosis not present

## 2018-02-19 DIAGNOSIS — Z8719 Personal history of other diseases of the digestive system: Secondary | ICD-10-CM

## 2018-02-19 DIAGNOSIS — Z8249 Family history of ischemic heart disease and other diseases of the circulatory system: Secondary | ICD-10-CM | POA: Diagnosis not present

## 2018-02-19 DIAGNOSIS — E1122 Type 2 diabetes mellitus with diabetic chronic kidney disease: Secondary | ICD-10-CM | POA: Diagnosis not present

## 2018-02-19 DIAGNOSIS — K650 Generalized (acute) peritonitis: Secondary | ICD-10-CM | POA: Diagnosis not present

## 2018-02-19 DIAGNOSIS — Z794 Long term (current) use of insulin: Secondary | ICD-10-CM | POA: Insufficient documentation

## 2018-02-19 DIAGNOSIS — Z803 Family history of malignant neoplasm of breast: Secondary | ICD-10-CM | POA: Diagnosis not present

## 2018-02-19 DIAGNOSIS — E876 Hypokalemia: Secondary | ICD-10-CM | POA: Diagnosis not present

## 2018-02-19 DIAGNOSIS — N186 End stage renal disease: Secondary | ICD-10-CM | POA: Diagnosis not present

## 2018-02-19 DIAGNOSIS — K4090 Unilateral inguinal hernia, without obstruction or gangrene, not specified as recurrent: Secondary | ICD-10-CM | POA: Insufficient documentation

## 2018-02-19 DIAGNOSIS — K219 Gastro-esophageal reflux disease without esophagitis: Secondary | ICD-10-CM | POA: Diagnosis not present

## 2018-02-19 DIAGNOSIS — D631 Anemia in chronic kidney disease: Secondary | ICD-10-CM | POA: Diagnosis not present

## 2018-02-19 DIAGNOSIS — C61 Malignant neoplasm of prostate: Secondary | ICD-10-CM | POA: Diagnosis not present

## 2018-02-19 DIAGNOSIS — Z9889 Other specified postprocedural states: Secondary | ICD-10-CM

## 2018-02-19 DIAGNOSIS — Z8546 Personal history of malignant neoplasm of prostate: Secondary | ICD-10-CM | POA: Insufficient documentation

## 2018-02-19 DIAGNOSIS — Z992 Dependence on renal dialysis: Secondary | ICD-10-CM | POA: Diagnosis not present

## 2018-02-19 DIAGNOSIS — I120 Hypertensive chronic kidney disease with stage 5 chronic kidney disease or end stage renal disease: Secondary | ICD-10-CM | POA: Diagnosis not present

## 2018-02-19 HISTORY — PX: INGUINAL HERNIA REPAIR: SHX194

## 2018-02-19 HISTORY — PX: INSERTION OF MESH: SHX5868

## 2018-02-19 LAB — POCT I-STAT 4, (NA,K, GLUC, HGB,HCT)
GLUCOSE: 169 mg/dL — AB (ref 65–99)
HCT: 31 % — ABNORMAL LOW (ref 39.0–52.0)
HEMOGLOBIN: 10.5 g/dL — AB (ref 13.0–17.0)
POTASSIUM: 4 mmol/L (ref 3.5–5.1)
SODIUM: 137 mmol/L (ref 135–145)

## 2018-02-19 LAB — GLUCOSE, CAPILLARY
Glucose-Capillary: 198 mg/dL — ABNORMAL HIGH (ref 65–99)
Glucose-Capillary: 131 mg/dL — ABNORMAL HIGH (ref 65–99)
Glucose-Capillary: 164 mg/dL — ABNORMAL HIGH (ref 65–99)

## 2018-02-19 SURGERY — REPAIR, HERNIA, INGUINAL, LAPAROSCOPIC
Anesthesia: General | Site: Inguinal | Laterality: Left

## 2018-02-19 MED ORDER — ROCURONIUM BROMIDE 100 MG/10ML IV SOLN
INTRAVENOUS | Status: DC | PRN
  Administered 2018-02-19: 10 mg via INTRAVENOUS
  Administered 2018-02-19: 30 mg via INTRAVENOUS
  Administered 2018-02-19: 5 mg via INTRAVENOUS

## 2018-02-19 MED ORDER — CHLORHEXIDINE GLUCONATE CLOTH 2 % EX PADS: 6.0000 | MEDICATED_PAD | Freq: Once | CUTANEOUS | Status: DC

## 2018-02-19 MED ORDER — NEOSTIGMINE METHYLSULFATE 10 MG/10ML IV SOLN
INTRAVENOUS | Status: DC | PRN
  Administered 2018-02-19: 4 mg via INTRAVENOUS

## 2018-02-19 MED ORDER — PROPOFOL 10 MG/ML IV BOLUS
INTRAVENOUS | Status: DC | PRN
  Administered 2018-02-19: 140 mg via INTRAVENOUS

## 2018-02-19 MED ORDER — EPHEDRINE SULFATE 50 MG/ML IJ SOLN
INTRAMUSCULAR | Status: DC | PRN
  Administered 2018-02-19: 5 mg via INTRAVENOUS

## 2018-02-19 MED ORDER — HYDROMORPHONE HCL 1 MG/ML IJ SOLN
INTRAMUSCULAR | Status: AC
  Filled 2018-02-19: qty 1

## 2018-02-19 MED ORDER — STERILE WATER FOR IRRIGATION IR SOLN
Status: DC | PRN
  Administered 2018-02-19: 1000 mL via INTRAVESICAL

## 2018-02-19 MED ORDER — ONDANSETRON HCL 4 MG/2ML IJ SOLN
INTRAMUSCULAR | Status: DC | PRN
  Administered 2018-02-19: 4 mg via INTRAVENOUS

## 2018-02-19 MED ORDER — FENTANYL CITRATE (PF) 100 MCG/2ML IJ SOLN
INTRAMUSCULAR | Status: DC | PRN
  Administered 2018-02-19 (×4): 50 ug via INTRAVENOUS

## 2018-02-19 MED ORDER — GLYCOPYRROLATE 0.2 MG/ML IJ SOLN
INTRAMUSCULAR | Status: DC | PRN
  Administered 2018-02-19: 0.6 mg via INTRAVENOUS

## 2018-02-19 MED ORDER — BUPIVACAINE HCL (PF) 0.25 % IJ SOLN
INTRAMUSCULAR | Status: AC
  Filled 2018-02-19: qty 30

## 2018-02-19 MED ORDER — DEXAMETHASONE SODIUM PHOSPHATE 10 MG/ML IJ SOLN
INTRAMUSCULAR | Status: DC | PRN
  Administered 2018-02-19: 5 mg via INTRAVENOUS

## 2018-02-19 MED ORDER — TRAMADOL HCL 50 MG PO TABS
ORAL_TABLET | ORAL | Status: AC
  Filled 2018-02-19: qty 1

## 2018-02-19 MED ORDER — BUPIVACAINE HCL 0.25 % IJ SOLN
INTRAMUSCULAR | Status: DC | PRN
  Administered 2018-02-19: 4 mL

## 2018-02-19 MED ORDER — 0.9 % SODIUM CHLORIDE (POUR BTL) OPTIME
TOPICAL | Status: DC | PRN
  Administered 2018-02-19: 1000 mL

## 2018-02-19 MED ORDER — TRAMADOL HCL 50 MG PO TABS: 50.0000 mg | ORAL_TABLET | Freq: Four times a day (QID) | ORAL | 0 refills | Status: DC | PRN

## 2018-02-19 MED ORDER — FENTANYL CITRATE (PF) 250 MCG/5ML IJ SOLN
INTRAMUSCULAR | Status: AC
  Filled 2018-02-19: qty 5

## 2018-02-19 MED ORDER — TRAMADOL HCL 50 MG PO TABS
50.0000 mg | ORAL_TABLET | Freq: Once | ORAL | Status: AC
Start: 2018-02-19 — End: 2018-02-19
  Administered 2018-02-19: 50 mg via ORAL

## 2018-02-19 MED ORDER — HYDROMORPHONE HCL 1 MG/ML IJ SOLN
0.2500 mg | INTRAMUSCULAR | Status: DC | PRN
  Administered 2018-02-19 (×2): 0.5 mg via INTRAVENOUS

## 2018-02-19 MED ORDER — PHENYLEPHRINE HCL 10 MG/ML IJ SOLN
INTRAMUSCULAR | Status: DC | PRN
  Administered 2018-02-19: 80 ug via INTRAVENOUS

## 2018-02-19 MED ORDER — ACETAMINOPHEN 500 MG PO TABS
1000.0000 mg | ORAL_TABLET | ORAL | Status: AC
  Administered 2018-02-19: 1000 mg via ORAL
  Filled 2018-02-19: qty 2

## 2018-02-19 MED ORDER — LIDOCAINE HCL (CARDIAC) 20 MG/ML IV SOLN
INTRAVENOUS | Status: DC | PRN
  Administered 2018-02-19: 100 mg via INTRAVENOUS

## 2018-02-19 MED ORDER — SODIUM CHLORIDE 0.9 % IV SOLN
INTRAVENOUS | Status: DC
  Administered 2018-02-19: 09:00:00 via INTRAVENOUS
  Administered 2018-02-19: 10 mL/h via INTRAVENOUS

## 2018-02-19 SURGICAL SUPPLY — 37 items
ADH SKN CLS APL DERMABOND .7 (GAUZE/BANDAGES/DRESSINGS) ×1
CHLORAPREP W/TINT 26ML (MISCELLANEOUS) ×3 IMPLANT
COVER SURGICAL LIGHT HANDLE (MISCELLANEOUS) ×3 IMPLANT
DERMABOND ADVANCED (GAUZE/BANDAGES/DRESSINGS) ×2
DERMABOND ADVANCED .7 DNX12 (GAUZE/BANDAGES/DRESSINGS) ×1 IMPLANT
ELECT REM PT RETURN 9FT ADLT (ELECTROSURGICAL) ×3
ELECTRODE REM PT RTRN 9FT ADLT (ELECTROSURGICAL) ×1 IMPLANT
GLOVE BIO SURGEON STRL SZ7.5 (GLOVE) ×3 IMPLANT
GLOVE BIOGEL PI IND STRL 6 (GLOVE) ×1 IMPLANT
GLOVE BIOGEL PI IND STRL 6.5 (GLOVE) ×2 IMPLANT
GLOVE BIOGEL PI IND STRL 7.5 (GLOVE) ×1 IMPLANT
GLOVE BIOGEL PI INDICATOR 6 (GLOVE) ×2
GLOVE BIOGEL PI INDICATOR 6.5 (GLOVE) ×4
GLOVE BIOGEL PI INDICATOR 7.5 (GLOVE) ×2
GLOVE SURG SS PI 6.0 STRL IVOR (GLOVE) ×3 IMPLANT
GLOVE SURG SS PI 7.0 STRL IVOR (GLOVE) ×3 IMPLANT
GOWN STRL REUS W/ TWL LRG LVL3 (GOWN DISPOSABLE) ×2 IMPLANT
GOWN STRL REUS W/ TWL XL LVL3 (GOWN DISPOSABLE) ×2 IMPLANT
GOWN STRL REUS W/TWL LRG LVL3 (GOWN DISPOSABLE) ×6
GOWN STRL REUS W/TWL XL LVL3 (GOWN DISPOSABLE) ×6
KIT BASIN OR (CUSTOM PROCEDURE TRAY) ×3 IMPLANT
KIT TURNOVER KIT B (KITS) ×3 IMPLANT
MESH 3DMAX 4X6 LT LRG (Mesh General) ×3 IMPLANT
NEEDLE INSUFFLATION 14GA 120MM (NEEDLE) ×3 IMPLANT
NS IRRIG 1000ML POUR BTL (IV SOLUTION) ×3 IMPLANT
PAD ARMBOARD 7.5X6 YLW CONV (MISCELLANEOUS) ×6 IMPLANT
RELOAD STAPLE HERNIA 4.0 BLUE (INSTRUMENTS) ×3 IMPLANT
SCISSORS LAP 5X35 DISP (ENDOMECHANICALS) ×3 IMPLANT
SET TROCAR LAP APPLE-HUNT 5MM (ENDOMECHANICALS) ×3 IMPLANT
STAPLER HERNIA 12 8.5 360D (INSTRUMENTS) ×3 IMPLANT
SUT MNCRL AB 4-0 PS2 18 (SUTURE) ×3 IMPLANT
SYRINGE TOOMEY DISP (SYRINGE) ×3 IMPLANT
TRAY FOLEY CATH SILVER 14FR (SET/KITS/TRAYS/PACK) ×3 IMPLANT
TRAY LAPAROSCOPIC MC (CUSTOM PROCEDURE TRAY) ×3 IMPLANT
TROCAR XCEL 12X100 BLDLESS (ENDOMECHANICALS) ×3 IMPLANT
TUBING INSUFFLATION (TUBING) ×3 IMPLANT
WATER STERILE IRR 1000ML POUR (IV SOLUTION) ×3 IMPLANT

## 2018-02-19 NOTE — Discharge Instructions (Signed)
[facility] _______[facility] ° °HERNIA REPAIR: POST OP INSTRUCTIONS ° °Always review your discharge instruction sheet given to you by the facility where your surgery was performed. °IF YOU HAVE DISABILITY OR FAMILY LEAVE FORMS, YOU MUST BRING THEM TO THE OFFICE FOR PROCESSING.   °DO NOT GIVE THEM TO YOUR DOCTOR. ° °1. A  prescription for pain medication may be given to you upon discharge.  Take your pain medication as prescribed, if needed.  If narcotic pain medicine is not needed, then you may take acetaminophen (Tylenol) or ibuprofen (Advil) as needed. °2. Take your usually prescribed medications unless otherwise directed. °If you need a refill on your pain medication, please contact your pharmacy.  They will contact our office to request authorization. Prescriptions will not be filled after 5 pm or on week-ends. °3. You should follow a light diet the first 24 hours after arrival home, such as soup and crackers, etc.  Be sure to include lots of fluids daily.  Resume your normal diet the day after surgery. °4.Most patients will experience some swelling and bruising around the umbilicus or in the groin and scrotum.  Ice packs and reclining will help.  Swelling and bruising can take several days to resolve.  °6. It is common to experience some constipation if taking pain medication after surgery.  Increasing fluid intake and taking a stool softener (such as Colace) will usually help or prevent this problem from occurring.  A mild laxative (Milk of Magnesia or Miralax) should be taken according to package directions if there are no bowel movements after 48 hours. °7. Unless discharge instructions indicate otherwise, you may remove your bandages 24-48 hours after surgery, and you may shower at that time.  You may have steri-strips (small skin tapes) in place directly over the incision.  These strips should be left on the skin for 7-10 days.  If your surgeon used skin glue on the incision, you may shower in  24 hours.  The glue will flake off over the next 2-3 weeks.  Any sutures or staples will be removed at the office during your follow-up visit. °8. ACTIVITIES:  You may resume regular (light) daily activities beginning the next day--such as daily self-care, walking, climbing stairs--gradually increasing activities as tolerated.  You may have sexual intercourse when it is comfortable.  Refrain from any heavy lifting or straining until approved by your doctor. ° °a.You may drive when you are no longer taking prescription pain medication, you can comfortably wear a seatbelt, and you can safely maneuver your car and apply brakes. °b.RETURN TO WORK:   °_____________________________________________ ° °9.You should see your doctor in the office for a follow-up appointment approximately 2-3 weeks after your surgery.  Make sure that you call for this appointment within a day or two after you arrive home to insure a convenient appointment time. °10.OTHER INSTRUCTIONS: _________________________ °   _____________________________________ ° °WHEN TO CALL YOUR DOCTOR: °1. Fever over 101.0 °2. Inability to urinate °3. Nausea and/or vomiting °4. Extreme swelling or bruising °5. Continued bleeding from incision. °6. Increased pain, redness, or drainage from the incision ° °The clinic staff is available to answer your questions during regular business hours.  Please don’t hesitate to call and ask to speak to one of the nurses for clinical concerns.  If you have a medical emergency, go to the nearest emergency room or call 911.  A surgeon from [facility] is always on call at the hospital ° ° °[address], [address], [place], [place]  [address] ? ° [address] [address], [place], [place]   [address] °[phone] ? [phone] ? FAX [phone] °Web site: [email or web address] ° °

## 2018-02-19 NOTE — Op Note (Signed)
02/19/2018  9:51 AM  PATIENT:  [name]  62 y.o. male  PRE-OPERATIVE DIAGNOSIS:  left inguinal hernia  POST-OPERATIVE DIAGNOSIS:  Left Large indirect inguinal hernia  PROCEDURE:  Procedure(s): LAPAROSCOPIC LEFT  INGUINAL HERNIA (Left) INSERTION OF MESH (Left)  SURGEON:  Surgeon(s) and Role:    Ralene Ok, MD - Primary  ANESTHESIA:   local and general  EBL:  minimal   BLOOD ADMINISTERED:none  DRAINS: none   LOCAL MEDICATIONS USED:  BUPIVICAINE   SPECIMEN:  No Specimen  DISPOSITION OF SPECIMEN:  N/A  COUNTS:  YES  TOURNIQUET:  * No tourniquets in log *  DICTATION: .Dragon Dictation    Counts: reported as correct x 2  Findings:  The patient had a large left indirect hernia  Indications for procedure:  The patient is a 63 year old male with a left hernia for several months. Patient complained of symptomatology to his left inguinal area. The patient was taken back for elective inguinal hernia repair.  Details of the procedure: The patient was taken back to the operating room. The patient was placed in supine position with bilateral SCDs in place.  The patient was prepped and draped in the usual sterile fashion.  After appropriate anitbiotics were confirmed, a time-out was confirmed and all facts were verified.  0.25% Marcaine was used to infiltrate the umbilical area. A 11-blade was used to cut down the skin and blunt dissection was used to get the anterior fashion.  The anterior fascia was incised approximately 1 cm and the muscles were retracted laterally. Blunt dissection was then used to create a space in the preperitoneal area. At this time a 10 mm camera was then introduced into the space and advanced the pubic tubercle and a 12 mm trocar was placed over this and insufflation was started.  At this time and space was created from medial to laterally the preperitoneal space.  Cooper's ligament was initially cleaned off.  The hernia sac was identified in the  indirect space. Dissection of the large hernia sac was undertaken the vas deferens was identified and protected in all parts of the case.  There was a small tear into the hernia sac. A Veress needle to the right of the umbilicus to help evacuate the intraperitoneal air.    Once the hernia sac was taken down to approximately the umbilicus a Bard 3D Max mesh, size: Large, was  introduced into the preperitoneal space.  The mesh was brought over to cover the direct and indirect hernia spaces.  This was anchored into place and secured to Cooper's ligament with 4.57mm staples from a Coviden hernia stapler. It was anchored to the anterior abdominal wall with 4.8 mm staples. The hernia sac was seen lying posterior to the mesh. There was no staples placed laterally. The insufflation was evacuated and the peritoneum was seen posterior to the mesh. The trochars were removed. The anterior fascia was reapproximated using #1 Vicryl on a UR- 6.  Intra-abdominal air was evacuated and the Veress needle removed. The skin was reapproximated using 4-0 Monocryl subcuticular fashion the patient was awakened from general anesthesia and taken to recovery in stable condition.   PLAN OF CARE: Discharge to home after PACU  PATIENT DISPOSITION:  PACU - hemodynamically stable.   Delay start of Pharmacological VTE agent (>24hrs) due to surgical blood loss or risk of bleeding: not applicable

## 2018-02-19 NOTE — Interval H&P Note (Signed)
History and Physical Interval Note:  02/19/2018 8:42 AM  Jared [name]  has presented today for surgery, with the diagnosis of left inguinal hernia  The various methods of treatment have been discussed with the patient and family. After consideration of risks, benefits and other options for treatment, the patient has consented to  Procedure(s): LAPAROSCOPIC LEFT  INGUINAL HERNIA (Left) INSERTION OF MESH (Left) as a surgical intervention .  The patient's history has been reviewed, patient examined, no change in status, stable for surgery.  I have reviewed the patient's chart and labs.  Questions were answered to the patient's satisfaction.     Rosario Jacks., Anne Hahn

## 2018-02-19 NOTE — Anesthesia Postprocedure Evaluation (Signed)
Anesthesia Post Note  Patient: Jared [name]  Procedure(s) Performed: LAPAROSCOPIC LEFT  INGUINAL HERNIA (Left Inguinal) INSERTION OF MESH (Left Inguinal)     Patient location during evaluation: PACU Anesthesia Type: General Level of consciousness: awake and alert Pain management: pain level controlled Vital Signs Assessment: post-procedure vital signs reviewed and stable Respiratory status: spontaneous breathing, nonlabored ventilation, respiratory function stable and patient connected to nasal cannula oxygen Cardiovascular status: blood pressure returned to baseline and stable Postop Assessment: no apparent nausea or vomiting Anesthetic complications: no    Last Vitals:  Vitals:   02/19/18 1033 02/19/18 1048  BP: (!) 184/88 (!) 180/77  Pulse: 63 60  Resp: 20 20  Temp:  (!) 36.1 C  SpO2: 100% 97%    Last Pain:  Vitals:   02/19/18 1048  TempSrc:   PainSc: 3                  Barnet Glasgow

## 2018-02-19 NOTE — Transfer of Care (Signed)
Immediate Anesthesia Transfer of Care Note  Patient: Jared [name]  Procedure(s) Performed: LAPAROSCOPIC LEFT  INGUINAL HERNIA (Left Inguinal) INSERTION OF MESH (Left Inguinal)  Patient Location: PACU  Anesthesia Type:General  Level of Consciousness: awake, alert  and oriented  Airway & Oxygen Therapy: Patient Spontanous Breathing and Patient connected to nasal cannula oxygen  Post-op Assessment: Report given to RN, Post -op Vital signs reviewed and stable and Patient moving all extremities X 4  Post vital signs: Reviewed and stable  Last Vitals:  Vitals Value Taken Time  BP    Temp    Pulse 72 02/19/2018 10:06 AM  Resp 14 02/19/2018 10:06 AM  SpO2 100 % 02/19/2018 10:06 AM  Vitals shown include unvalidated device data.  Last Pain:  Vitals:   02/19/18 0805  TempSrc:   PainSc: 0-No pain      Patients Stated Pain Goal: 3 (59/56/38 7564)  Complications: No apparent anesthesia complications

## 2018-02-19 NOTE — Anesthesia Procedure Notes (Signed)
Procedure Name: Intubation Date/Time: 02/19/2018 8:57 AM Performed by: Gaylene Brooks, CRNA Pre-anesthesia Checklist: Patient identified, Emergency Drugs available, Suction available and Patient being monitored Patient Re-evaluated:Patient Re-evaluated prior to induction Oxygen Delivery Method: Circle System Utilized Preoxygenation: Pre-oxygenation with 100% oxygen Induction Type: IV induction Ventilation: Mask ventilation without difficulty and Oral airway inserted - appropriate to patient size Laryngoscope Size: Sabra Heck and 2 Grade View: Grade I Tube type: Oral Tube size: 7.5 mm Number of attempts: 1 Airway Equipment and Method: Stylet and Oral airway Placement Confirmation: ETT inserted through vocal cords under direct vision,  positive ETCO2 and breath sounds checked- equal and bilateral Secured at: 22 cm Tube secured with: Tape Dental Injury: Teeth and Oropharynx as per pre-operative assessment

## 2018-02-19 NOTE — Progress Notes (Signed)
SBP 180's to 190's. Dr Valma Cava here & aware. No new orders.

## 2018-02-20 ENCOUNTER — Encounter (HOSPITAL_COMMUNITY): Payer: Self-pay | Admitting: General Surgery

## 2018-02-20 DIAGNOSIS — K650 Generalized (acute) peritonitis: Secondary | ICD-10-CM | POA: Diagnosis not present

## 2018-02-20 DIAGNOSIS — N186 End stage renal disease: Secondary | ICD-10-CM | POA: Diagnosis not present

## 2018-02-20 DIAGNOSIS — D631 Anemia in chronic kidney disease: Secondary | ICD-10-CM | POA: Diagnosis not present

## 2018-02-20 DIAGNOSIS — Z5189 Encounter for other specified aftercare: Secondary | ICD-10-CM | POA: Diagnosis not present

## 2018-02-20 DIAGNOSIS — Z79899 Other long term (current) drug therapy: Secondary | ICD-10-CM | POA: Diagnosis not present

## 2018-02-20 DIAGNOSIS — E876 Hypokalemia: Secondary | ICD-10-CM | POA: Diagnosis not present

## 2018-02-21 DIAGNOSIS — D631 Anemia in chronic kidney disease: Secondary | ICD-10-CM | POA: Diagnosis not present

## 2018-02-21 DIAGNOSIS — Z5189 Encounter for other specified aftercare: Secondary | ICD-10-CM | POA: Diagnosis not present

## 2018-02-21 DIAGNOSIS — K650 Generalized (acute) peritonitis: Secondary | ICD-10-CM | POA: Diagnosis not present

## 2018-02-21 DIAGNOSIS — N186 End stage renal disease: Secondary | ICD-10-CM | POA: Diagnosis not present

## 2018-02-21 DIAGNOSIS — Z79899 Other long term (current) drug therapy: Secondary | ICD-10-CM | POA: Diagnosis not present

## 2018-02-21 DIAGNOSIS — E876 Hypokalemia: Secondary | ICD-10-CM | POA: Diagnosis not present

## 2018-02-22 DIAGNOSIS — D631 Anemia in chronic kidney disease: Secondary | ICD-10-CM | POA: Diagnosis not present

## 2018-02-22 DIAGNOSIS — K650 Generalized (acute) peritonitis: Secondary | ICD-10-CM | POA: Diagnosis not present

## 2018-02-22 DIAGNOSIS — N186 End stage renal disease: Secondary | ICD-10-CM | POA: Diagnosis not present

## 2018-02-22 DIAGNOSIS — Z79899 Other long term (current) drug therapy: Secondary | ICD-10-CM | POA: Diagnosis not present

## 2018-02-22 DIAGNOSIS — E876 Hypokalemia: Secondary | ICD-10-CM | POA: Diagnosis not present

## 2018-02-22 DIAGNOSIS — Z5189 Encounter for other specified aftercare: Secondary | ICD-10-CM | POA: Diagnosis not present

## 2018-02-23 DIAGNOSIS — N186 End stage renal disease: Secondary | ICD-10-CM | POA: Diagnosis not present

## 2018-02-23 DIAGNOSIS — K650 Generalized (acute) peritonitis: Secondary | ICD-10-CM | POA: Diagnosis not present

## 2018-02-23 DIAGNOSIS — Z79899 Other long term (current) drug therapy: Secondary | ICD-10-CM | POA: Diagnosis not present

## 2018-02-23 DIAGNOSIS — D631 Anemia in chronic kidney disease: Secondary | ICD-10-CM | POA: Diagnosis not present

## 2018-02-23 DIAGNOSIS — Z5189 Encounter for other specified aftercare: Secondary | ICD-10-CM | POA: Diagnosis not present

## 2018-02-23 DIAGNOSIS — E876 Hypokalemia: Secondary | ICD-10-CM | POA: Diagnosis not present

## 2018-02-24 DIAGNOSIS — Z79899 Other long term (current) drug therapy: Secondary | ICD-10-CM | POA: Diagnosis not present

## 2018-02-24 DIAGNOSIS — K650 Generalized (acute) peritonitis: Secondary | ICD-10-CM | POA: Diagnosis not present

## 2018-02-24 DIAGNOSIS — D631 Anemia in chronic kidney disease: Secondary | ICD-10-CM | POA: Diagnosis not present

## 2018-02-24 DIAGNOSIS — Z5189 Encounter for other specified aftercare: Secondary | ICD-10-CM | POA: Diagnosis not present

## 2018-02-24 DIAGNOSIS — E876 Hypokalemia: Secondary | ICD-10-CM | POA: Diagnosis not present

## 2018-02-24 DIAGNOSIS — N186 End stage renal disease: Secondary | ICD-10-CM | POA: Diagnosis not present

## 2018-02-25 DIAGNOSIS — N186 End stage renal disease: Secondary | ICD-10-CM | POA: Diagnosis not present

## 2018-02-25 DIAGNOSIS — N281 Cyst of kidney, acquired: Secondary | ICD-10-CM | POA: Diagnosis not present

## 2018-02-25 DIAGNOSIS — K769 Liver disease, unspecified: Secondary | ICD-10-CM | POA: Diagnosis not present

## 2018-02-25 DIAGNOSIS — Z992 Dependence on renal dialysis: Secondary | ICD-10-CM | POA: Diagnosis not present

## 2018-02-25 DIAGNOSIS — D509 Iron deficiency anemia, unspecified: Secondary | ICD-10-CM | POA: Diagnosis not present

## 2018-02-25 DIAGNOSIS — Z4932 Encounter for adequacy testing for peritoneal dialysis: Secondary | ICD-10-CM | POA: Diagnosis not present

## 2018-02-25 DIAGNOSIS — N2581 Secondary hyperparathyroidism of renal origin: Secondary | ICD-10-CM | POA: Diagnosis not present

## 2018-02-25 DIAGNOSIS — C61 Malignant neoplasm of prostate: Secondary | ICD-10-CM | POA: Diagnosis not present

## 2018-02-25 DIAGNOSIS — N2589 Other disorders resulting from impaired renal tubular function: Secondary | ICD-10-CM | POA: Diagnosis not present

## 2018-02-25 DIAGNOSIS — D631 Anemia in chronic kidney disease: Secondary | ICD-10-CM | POA: Diagnosis not present

## 2018-02-25 DIAGNOSIS — C775 Secondary and unspecified malignant neoplasm of intrapelvic lymph nodes: Secondary | ICD-10-CM | POA: Diagnosis not present

## 2018-02-25 DIAGNOSIS — I129 Hypertensive chronic kidney disease with stage 1 through stage 4 chronic kidney disease, or unspecified chronic kidney disease: Secondary | ICD-10-CM | POA: Diagnosis not present

## 2018-02-26 DIAGNOSIS — D509 Iron deficiency anemia, unspecified: Secondary | ICD-10-CM | POA: Diagnosis not present

## 2018-02-26 DIAGNOSIS — Z4932 Encounter for adequacy testing for peritoneal dialysis: Secondary | ICD-10-CM | POA: Diagnosis not present

## 2018-02-26 DIAGNOSIS — N2581 Secondary hyperparathyroidism of renal origin: Secondary | ICD-10-CM | POA: Diagnosis not present

## 2018-02-26 DIAGNOSIS — N186 End stage renal disease: Secondary | ICD-10-CM | POA: Diagnosis not present

## 2018-02-26 DIAGNOSIS — K769 Liver disease, unspecified: Secondary | ICD-10-CM | POA: Diagnosis not present

## 2018-02-26 DIAGNOSIS — D631 Anemia in chronic kidney disease: Secondary | ICD-10-CM | POA: Diagnosis not present

## 2018-02-27 DIAGNOSIS — N186 End stage renal disease: Secondary | ICD-10-CM | POA: Diagnosis not present

## 2018-02-27 DIAGNOSIS — Z4932 Encounter for adequacy testing for peritoneal dialysis: Secondary | ICD-10-CM | POA: Diagnosis not present

## 2018-02-27 DIAGNOSIS — D509 Iron deficiency anemia, unspecified: Secondary | ICD-10-CM | POA: Diagnosis not present

## 2018-02-27 DIAGNOSIS — D631 Anemia in chronic kidney disease: Secondary | ICD-10-CM | POA: Diagnosis not present

## 2018-02-27 DIAGNOSIS — K769 Liver disease, unspecified: Secondary | ICD-10-CM | POA: Diagnosis not present

## 2018-02-27 DIAGNOSIS — N2581 Secondary hyperparathyroidism of renal origin: Secondary | ICD-10-CM | POA: Diagnosis not present

## 2018-02-28 DIAGNOSIS — K769 Liver disease, unspecified: Secondary | ICD-10-CM | POA: Diagnosis not present

## 2018-02-28 DIAGNOSIS — D509 Iron deficiency anemia, unspecified: Secondary | ICD-10-CM | POA: Diagnosis not present

## 2018-02-28 DIAGNOSIS — N2581 Secondary hyperparathyroidism of renal origin: Secondary | ICD-10-CM | POA: Diagnosis not present

## 2018-02-28 DIAGNOSIS — D631 Anemia in chronic kidney disease: Secondary | ICD-10-CM | POA: Diagnosis not present

## 2018-02-28 DIAGNOSIS — R82998 Other abnormal findings in urine: Secondary | ICD-10-CM | POA: Diagnosis not present

## 2018-02-28 DIAGNOSIS — Z4932 Encounter for adequacy testing for peritoneal dialysis: Secondary | ICD-10-CM | POA: Diagnosis not present

## 2018-02-28 DIAGNOSIS — E7849 Other hyperlipidemia: Secondary | ICD-10-CM | POA: Diagnosis not present

## 2018-02-28 DIAGNOSIS — N186 End stage renal disease: Secondary | ICD-10-CM | POA: Diagnosis not present

## 2018-03-01 DIAGNOSIS — K769 Liver disease, unspecified: Secondary | ICD-10-CM | POA: Diagnosis not present

## 2018-03-01 DIAGNOSIS — N186 End stage renal disease: Secondary | ICD-10-CM | POA: Diagnosis not present

## 2018-03-01 DIAGNOSIS — D509 Iron deficiency anemia, unspecified: Secondary | ICD-10-CM | POA: Diagnosis not present

## 2018-03-01 DIAGNOSIS — N2581 Secondary hyperparathyroidism of renal origin: Secondary | ICD-10-CM | POA: Diagnosis not present

## 2018-03-01 DIAGNOSIS — D631 Anemia in chronic kidney disease: Secondary | ICD-10-CM | POA: Diagnosis not present

## 2018-03-01 DIAGNOSIS — Z4932 Encounter for adequacy testing for peritoneal dialysis: Secondary | ICD-10-CM | POA: Diagnosis not present

## 2018-03-02 DIAGNOSIS — Z4932 Encounter for adequacy testing for peritoneal dialysis: Secondary | ICD-10-CM | POA: Diagnosis not present

## 2018-03-02 DIAGNOSIS — N186 End stage renal disease: Secondary | ICD-10-CM | POA: Diagnosis not present

## 2018-03-02 DIAGNOSIS — D631 Anemia in chronic kidney disease: Secondary | ICD-10-CM | POA: Diagnosis not present

## 2018-03-02 DIAGNOSIS — K769 Liver disease, unspecified: Secondary | ICD-10-CM | POA: Diagnosis not present

## 2018-03-02 DIAGNOSIS — D509 Iron deficiency anemia, unspecified: Secondary | ICD-10-CM | POA: Diagnosis not present

## 2018-03-02 DIAGNOSIS — N2581 Secondary hyperparathyroidism of renal origin: Secondary | ICD-10-CM | POA: Diagnosis not present

## 2018-03-03 DIAGNOSIS — N186 End stage renal disease: Secondary | ICD-10-CM | POA: Diagnosis not present

## 2018-03-03 DIAGNOSIS — Z4932 Encounter for adequacy testing for peritoneal dialysis: Secondary | ICD-10-CM | POA: Diagnosis not present

## 2018-03-03 DIAGNOSIS — N2581 Secondary hyperparathyroidism of renal origin: Secondary | ICD-10-CM | POA: Diagnosis not present

## 2018-03-03 DIAGNOSIS — K769 Liver disease, unspecified: Secondary | ICD-10-CM | POA: Diagnosis not present

## 2018-03-03 DIAGNOSIS — D509 Iron deficiency anemia, unspecified: Secondary | ICD-10-CM | POA: Diagnosis not present

## 2018-03-03 DIAGNOSIS — D631 Anemia in chronic kidney disease: Secondary | ICD-10-CM | POA: Diagnosis not present

## 2018-03-04 ENCOUNTER — Telehealth: Payer: Self-pay | Admitting: Neurology

## 2018-03-04 ENCOUNTER — Other Ambulatory Visit: Payer: Medicare Other

## 2018-03-04 DIAGNOSIS — K769 Liver disease, unspecified: Secondary | ICD-10-CM | POA: Diagnosis not present

## 2018-03-04 DIAGNOSIS — N2581 Secondary hyperparathyroidism of renal origin: Secondary | ICD-10-CM | POA: Diagnosis not present

## 2018-03-04 DIAGNOSIS — Z4932 Encounter for adequacy testing for peritoneal dialysis: Secondary | ICD-10-CM | POA: Diagnosis not present

## 2018-03-04 DIAGNOSIS — D509 Iron deficiency anemia, unspecified: Secondary | ICD-10-CM | POA: Diagnosis not present

## 2018-03-04 DIAGNOSIS — N186 End stage renal disease: Secondary | ICD-10-CM | POA: Diagnosis not present

## 2018-03-04 DIAGNOSIS — D631 Anemia in chronic kidney disease: Secondary | ICD-10-CM | POA: Diagnosis not present

## 2018-03-04 NOTE — Telephone Encounter (Signed)
Dr.Sethi this is FYI for you. Thanks.

## 2018-03-04 NOTE — Telephone Encounter (Signed)
Pt c/a appt today, he does not have the finances for EEG. Pt states he's had an EEG recently at the hospital. Pt is concerned his insurance will not pay for this test at the clinic and cannot afford it. Pt c/a his appt today at 1:00. FYI

## 2018-03-05 ENCOUNTER — Encounter: Payer: Self-pay | Admitting: Neurology

## 2018-03-05 DIAGNOSIS — D631 Anemia in chronic kidney disease: Secondary | ICD-10-CM | POA: Diagnosis not present

## 2018-03-05 DIAGNOSIS — K769 Liver disease, unspecified: Secondary | ICD-10-CM | POA: Diagnosis not present

## 2018-03-05 DIAGNOSIS — D509 Iron deficiency anemia, unspecified: Secondary | ICD-10-CM | POA: Diagnosis not present

## 2018-03-05 DIAGNOSIS — Z4932 Encounter for adequacy testing for peritoneal dialysis: Secondary | ICD-10-CM | POA: Diagnosis not present

## 2018-03-05 DIAGNOSIS — N2581 Secondary hyperparathyroidism of renal origin: Secondary | ICD-10-CM | POA: Diagnosis not present

## 2018-03-05 DIAGNOSIS — N186 End stage renal disease: Secondary | ICD-10-CM | POA: Diagnosis not present

## 2018-03-05 NOTE — Telephone Encounter (Signed)
Ok thanks for letting me know

## 2018-03-06 DIAGNOSIS — D631 Anemia in chronic kidney disease: Secondary | ICD-10-CM | POA: Diagnosis not present

## 2018-03-06 DIAGNOSIS — D509 Iron deficiency anemia, unspecified: Secondary | ICD-10-CM | POA: Diagnosis not present

## 2018-03-06 DIAGNOSIS — K769 Liver disease, unspecified: Secondary | ICD-10-CM | POA: Diagnosis not present

## 2018-03-06 DIAGNOSIS — N186 End stage renal disease: Secondary | ICD-10-CM | POA: Diagnosis not present

## 2018-03-06 DIAGNOSIS — Z4932 Encounter for adequacy testing for peritoneal dialysis: Secondary | ICD-10-CM | POA: Diagnosis not present

## 2018-03-06 DIAGNOSIS — N2581 Secondary hyperparathyroidism of renal origin: Secondary | ICD-10-CM | POA: Diagnosis not present

## 2018-03-07 DIAGNOSIS — K769 Liver disease, unspecified: Secondary | ICD-10-CM | POA: Diagnosis not present

## 2018-03-07 DIAGNOSIS — D509 Iron deficiency anemia, unspecified: Secondary | ICD-10-CM | POA: Diagnosis not present

## 2018-03-07 DIAGNOSIS — Z4932 Encounter for adequacy testing for peritoneal dialysis: Secondary | ICD-10-CM | POA: Diagnosis not present

## 2018-03-07 DIAGNOSIS — N2581 Secondary hyperparathyroidism of renal origin: Secondary | ICD-10-CM | POA: Diagnosis not present

## 2018-03-07 DIAGNOSIS — N186 End stage renal disease: Secondary | ICD-10-CM | POA: Diagnosis not present

## 2018-03-07 DIAGNOSIS — D631 Anemia in chronic kidney disease: Secondary | ICD-10-CM | POA: Diagnosis not present

## 2018-03-08 DIAGNOSIS — Z4932 Encounter for adequacy testing for peritoneal dialysis: Secondary | ICD-10-CM | POA: Diagnosis not present

## 2018-03-08 DIAGNOSIS — D631 Anemia in chronic kidney disease: Secondary | ICD-10-CM | POA: Diagnosis not present

## 2018-03-08 DIAGNOSIS — N2581 Secondary hyperparathyroidism of renal origin: Secondary | ICD-10-CM | POA: Diagnosis not present

## 2018-03-08 DIAGNOSIS — N186 End stage renal disease: Secondary | ICD-10-CM | POA: Diagnosis not present

## 2018-03-08 DIAGNOSIS — K769 Liver disease, unspecified: Secondary | ICD-10-CM | POA: Diagnosis not present

## 2018-03-08 DIAGNOSIS — D509 Iron deficiency anemia, unspecified: Secondary | ICD-10-CM | POA: Diagnosis not present

## 2018-03-09 DIAGNOSIS — N2581 Secondary hyperparathyroidism of renal origin: Secondary | ICD-10-CM | POA: Diagnosis not present

## 2018-03-09 DIAGNOSIS — N186 End stage renal disease: Secondary | ICD-10-CM | POA: Diagnosis not present

## 2018-03-09 DIAGNOSIS — D631 Anemia in chronic kidney disease: Secondary | ICD-10-CM | POA: Diagnosis not present

## 2018-03-09 DIAGNOSIS — D509 Iron deficiency anemia, unspecified: Secondary | ICD-10-CM | POA: Diagnosis not present

## 2018-03-09 DIAGNOSIS — Z4932 Encounter for adequacy testing for peritoneal dialysis: Secondary | ICD-10-CM | POA: Diagnosis not present

## 2018-03-09 DIAGNOSIS — K769 Liver disease, unspecified: Secondary | ICD-10-CM | POA: Diagnosis not present

## 2018-03-10 DIAGNOSIS — N186 End stage renal disease: Secondary | ICD-10-CM | POA: Diagnosis not present

## 2018-03-10 DIAGNOSIS — K769 Liver disease, unspecified: Secondary | ICD-10-CM | POA: Diagnosis not present

## 2018-03-10 DIAGNOSIS — D631 Anemia in chronic kidney disease: Secondary | ICD-10-CM | POA: Diagnosis not present

## 2018-03-10 DIAGNOSIS — Z4932 Encounter for adequacy testing for peritoneal dialysis: Secondary | ICD-10-CM | POA: Diagnosis not present

## 2018-03-10 DIAGNOSIS — N2581 Secondary hyperparathyroidism of renal origin: Secondary | ICD-10-CM | POA: Diagnosis not present

## 2018-03-10 DIAGNOSIS — D509 Iron deficiency anemia, unspecified: Secondary | ICD-10-CM | POA: Diagnosis not present

## 2018-03-11 DIAGNOSIS — N186 End stage renal disease: Secondary | ICD-10-CM | POA: Diagnosis not present

## 2018-03-11 DIAGNOSIS — D631 Anemia in chronic kidney disease: Secondary | ICD-10-CM | POA: Diagnosis not present

## 2018-03-11 DIAGNOSIS — N2581 Secondary hyperparathyroidism of renal origin: Secondary | ICD-10-CM | POA: Diagnosis not present

## 2018-03-11 DIAGNOSIS — K769 Liver disease, unspecified: Secondary | ICD-10-CM | POA: Diagnosis not present

## 2018-03-11 DIAGNOSIS — D509 Iron deficiency anemia, unspecified: Secondary | ICD-10-CM | POA: Diagnosis not present

## 2018-03-11 DIAGNOSIS — Z4932 Encounter for adequacy testing for peritoneal dialysis: Secondary | ICD-10-CM | POA: Diagnosis not present

## 2018-03-12 DIAGNOSIS — K769 Liver disease, unspecified: Secondary | ICD-10-CM | POA: Diagnosis not present

## 2018-03-12 DIAGNOSIS — Z4932 Encounter for adequacy testing for peritoneal dialysis: Secondary | ICD-10-CM | POA: Diagnosis not present

## 2018-03-12 DIAGNOSIS — N186 End stage renal disease: Secondary | ICD-10-CM | POA: Diagnosis not present

## 2018-03-12 DIAGNOSIS — D509 Iron deficiency anemia, unspecified: Secondary | ICD-10-CM | POA: Diagnosis not present

## 2018-03-12 DIAGNOSIS — N2581 Secondary hyperparathyroidism of renal origin: Secondary | ICD-10-CM | POA: Diagnosis not present

## 2018-03-12 DIAGNOSIS — D631 Anemia in chronic kidney disease: Secondary | ICD-10-CM | POA: Diagnosis not present

## 2018-03-13 DIAGNOSIS — K769 Liver disease, unspecified: Secondary | ICD-10-CM | POA: Diagnosis not present

## 2018-03-13 DIAGNOSIS — N2581 Secondary hyperparathyroidism of renal origin: Secondary | ICD-10-CM | POA: Diagnosis not present

## 2018-03-13 DIAGNOSIS — N186 End stage renal disease: Secondary | ICD-10-CM | POA: Diagnosis not present

## 2018-03-13 DIAGNOSIS — Z4932 Encounter for adequacy testing for peritoneal dialysis: Secondary | ICD-10-CM | POA: Diagnosis not present

## 2018-03-13 DIAGNOSIS — D509 Iron deficiency anemia, unspecified: Secondary | ICD-10-CM | POA: Diagnosis not present

## 2018-03-13 DIAGNOSIS — D631 Anemia in chronic kidney disease: Secondary | ICD-10-CM | POA: Diagnosis not present

## 2018-03-14 DIAGNOSIS — N2581 Secondary hyperparathyroidism of renal origin: Secondary | ICD-10-CM | POA: Diagnosis not present

## 2018-03-14 DIAGNOSIS — K769 Liver disease, unspecified: Secondary | ICD-10-CM | POA: Diagnosis not present

## 2018-03-14 DIAGNOSIS — D631 Anemia in chronic kidney disease: Secondary | ICD-10-CM | POA: Diagnosis not present

## 2018-03-14 DIAGNOSIS — N186 End stage renal disease: Secondary | ICD-10-CM | POA: Diagnosis not present

## 2018-03-14 DIAGNOSIS — D509 Iron deficiency anemia, unspecified: Secondary | ICD-10-CM | POA: Diagnosis not present

## 2018-03-14 DIAGNOSIS — Z4932 Encounter for adequacy testing for peritoneal dialysis: Secondary | ICD-10-CM | POA: Diagnosis not present

## 2018-03-15 DIAGNOSIS — N2581 Secondary hyperparathyroidism of renal origin: Secondary | ICD-10-CM | POA: Diagnosis not present

## 2018-03-15 DIAGNOSIS — Z4932 Encounter for adequacy testing for peritoneal dialysis: Secondary | ICD-10-CM | POA: Diagnosis not present

## 2018-03-15 DIAGNOSIS — D509 Iron deficiency anemia, unspecified: Secondary | ICD-10-CM | POA: Diagnosis not present

## 2018-03-15 DIAGNOSIS — K769 Liver disease, unspecified: Secondary | ICD-10-CM | POA: Diagnosis not present

## 2018-03-15 DIAGNOSIS — N186 End stage renal disease: Secondary | ICD-10-CM | POA: Diagnosis not present

## 2018-03-15 DIAGNOSIS — D631 Anemia in chronic kidney disease: Secondary | ICD-10-CM | POA: Diagnosis not present

## 2018-03-16 DIAGNOSIS — D509 Iron deficiency anemia, unspecified: Secondary | ICD-10-CM | POA: Diagnosis not present

## 2018-03-16 DIAGNOSIS — D631 Anemia in chronic kidney disease: Secondary | ICD-10-CM | POA: Diagnosis not present

## 2018-03-16 DIAGNOSIS — N2581 Secondary hyperparathyroidism of renal origin: Secondary | ICD-10-CM | POA: Diagnosis not present

## 2018-03-16 DIAGNOSIS — N186 End stage renal disease: Secondary | ICD-10-CM | POA: Diagnosis not present

## 2018-03-16 DIAGNOSIS — Z4932 Encounter for adequacy testing for peritoneal dialysis: Secondary | ICD-10-CM | POA: Diagnosis not present

## 2018-03-16 DIAGNOSIS — K769 Liver disease, unspecified: Secondary | ICD-10-CM | POA: Diagnosis not present

## 2018-03-17 DIAGNOSIS — N2581 Secondary hyperparathyroidism of renal origin: Secondary | ICD-10-CM | POA: Diagnosis not present

## 2018-03-17 DIAGNOSIS — D509 Iron deficiency anemia, unspecified: Secondary | ICD-10-CM | POA: Diagnosis not present

## 2018-03-17 DIAGNOSIS — Z4932 Encounter for adequacy testing for peritoneal dialysis: Secondary | ICD-10-CM | POA: Diagnosis not present

## 2018-03-17 DIAGNOSIS — K769 Liver disease, unspecified: Secondary | ICD-10-CM | POA: Diagnosis not present

## 2018-03-17 DIAGNOSIS — D631 Anemia in chronic kidney disease: Secondary | ICD-10-CM | POA: Diagnosis not present

## 2018-03-17 DIAGNOSIS — N186 End stage renal disease: Secondary | ICD-10-CM | POA: Diagnosis not present

## 2018-03-18 DIAGNOSIS — D509 Iron deficiency anemia, unspecified: Secondary | ICD-10-CM | POA: Diagnosis not present

## 2018-03-18 DIAGNOSIS — N2581 Secondary hyperparathyroidism of renal origin: Secondary | ICD-10-CM | POA: Diagnosis not present

## 2018-03-18 DIAGNOSIS — D631 Anemia in chronic kidney disease: Secondary | ICD-10-CM | POA: Diagnosis not present

## 2018-03-18 DIAGNOSIS — K769 Liver disease, unspecified: Secondary | ICD-10-CM | POA: Diagnosis not present

## 2018-03-18 DIAGNOSIS — Z4932 Encounter for adequacy testing for peritoneal dialysis: Secondary | ICD-10-CM | POA: Diagnosis not present

## 2018-03-18 DIAGNOSIS — N186 End stage renal disease: Secondary | ICD-10-CM | POA: Diagnosis not present

## 2018-03-19 DIAGNOSIS — N186 End stage renal disease: Secondary | ICD-10-CM | POA: Diagnosis not present

## 2018-03-19 DIAGNOSIS — D631 Anemia in chronic kidney disease: Secondary | ICD-10-CM | POA: Diagnosis not present

## 2018-03-19 DIAGNOSIS — Z4932 Encounter for adequacy testing for peritoneal dialysis: Secondary | ICD-10-CM | POA: Diagnosis not present

## 2018-03-19 DIAGNOSIS — D509 Iron deficiency anemia, unspecified: Secondary | ICD-10-CM | POA: Diagnosis not present

## 2018-03-19 DIAGNOSIS — N2581 Secondary hyperparathyroidism of renal origin: Secondary | ICD-10-CM | POA: Diagnosis not present

## 2018-03-19 DIAGNOSIS — K769 Liver disease, unspecified: Secondary | ICD-10-CM | POA: Diagnosis not present

## 2018-03-20 DIAGNOSIS — N2581 Secondary hyperparathyroidism of renal origin: Secondary | ICD-10-CM | POA: Diagnosis not present

## 2018-03-20 DIAGNOSIS — Z4932 Encounter for adequacy testing for peritoneal dialysis: Secondary | ICD-10-CM | POA: Diagnosis not present

## 2018-03-20 DIAGNOSIS — D631 Anemia in chronic kidney disease: Secondary | ICD-10-CM | POA: Diagnosis not present

## 2018-03-20 DIAGNOSIS — K769 Liver disease, unspecified: Secondary | ICD-10-CM | POA: Diagnosis not present

## 2018-03-20 DIAGNOSIS — D509 Iron deficiency anemia, unspecified: Secondary | ICD-10-CM | POA: Diagnosis not present

## 2018-03-20 DIAGNOSIS — N186 End stage renal disease: Secondary | ICD-10-CM | POA: Diagnosis not present

## 2018-03-21 DIAGNOSIS — N186 End stage renal disease: Secondary | ICD-10-CM | POA: Diagnosis not present

## 2018-03-21 DIAGNOSIS — D509 Iron deficiency anemia, unspecified: Secondary | ICD-10-CM | POA: Diagnosis not present

## 2018-03-21 DIAGNOSIS — D631 Anemia in chronic kidney disease: Secondary | ICD-10-CM | POA: Diagnosis not present

## 2018-03-21 DIAGNOSIS — Z4932 Encounter for adequacy testing for peritoneal dialysis: Secondary | ICD-10-CM | POA: Diagnosis not present

## 2018-03-21 DIAGNOSIS — K769 Liver disease, unspecified: Secondary | ICD-10-CM | POA: Diagnosis not present

## 2018-03-21 DIAGNOSIS — N2581 Secondary hyperparathyroidism of renal origin: Secondary | ICD-10-CM | POA: Diagnosis not present

## 2018-03-22 DIAGNOSIS — D631 Anemia in chronic kidney disease: Secondary | ICD-10-CM | POA: Diagnosis not present

## 2018-03-22 DIAGNOSIS — Z4932 Encounter for adequacy testing for peritoneal dialysis: Secondary | ICD-10-CM | POA: Diagnosis not present

## 2018-03-22 DIAGNOSIS — N186 End stage renal disease: Secondary | ICD-10-CM | POA: Diagnosis not present

## 2018-03-22 DIAGNOSIS — N2581 Secondary hyperparathyroidism of renal origin: Secondary | ICD-10-CM | POA: Diagnosis not present

## 2018-03-22 DIAGNOSIS — K769 Liver disease, unspecified: Secondary | ICD-10-CM | POA: Diagnosis not present

## 2018-03-22 DIAGNOSIS — D509 Iron deficiency anemia, unspecified: Secondary | ICD-10-CM | POA: Diagnosis not present

## 2018-03-23 DIAGNOSIS — N2581 Secondary hyperparathyroidism of renal origin: Secondary | ICD-10-CM | POA: Diagnosis not present

## 2018-03-23 DIAGNOSIS — D631 Anemia in chronic kidney disease: Secondary | ICD-10-CM | POA: Diagnosis not present

## 2018-03-23 DIAGNOSIS — N186 End stage renal disease: Secondary | ICD-10-CM | POA: Diagnosis not present

## 2018-03-23 DIAGNOSIS — D509 Iron deficiency anemia, unspecified: Secondary | ICD-10-CM | POA: Diagnosis not present

## 2018-03-23 DIAGNOSIS — Z4932 Encounter for adequacy testing for peritoneal dialysis: Secondary | ICD-10-CM | POA: Diagnosis not present

## 2018-03-23 DIAGNOSIS — K769 Liver disease, unspecified: Secondary | ICD-10-CM | POA: Diagnosis not present

## 2018-03-24 DIAGNOSIS — N186 End stage renal disease: Secondary | ICD-10-CM | POA: Diagnosis not present

## 2018-03-24 DIAGNOSIS — Z4932 Encounter for adequacy testing for peritoneal dialysis: Secondary | ICD-10-CM | POA: Diagnosis not present

## 2018-03-24 DIAGNOSIS — N2581 Secondary hyperparathyroidism of renal origin: Secondary | ICD-10-CM | POA: Diagnosis not present

## 2018-03-24 DIAGNOSIS — K769 Liver disease, unspecified: Secondary | ICD-10-CM | POA: Diagnosis not present

## 2018-03-24 DIAGNOSIS — D509 Iron deficiency anemia, unspecified: Secondary | ICD-10-CM | POA: Diagnosis not present

## 2018-03-24 DIAGNOSIS — D631 Anemia in chronic kidney disease: Secondary | ICD-10-CM | POA: Diagnosis not present

## 2018-03-25 DIAGNOSIS — Z4932 Encounter for adequacy testing for peritoneal dialysis: Secondary | ICD-10-CM | POA: Diagnosis not present

## 2018-03-25 DIAGNOSIS — D631 Anemia in chronic kidney disease: Secondary | ICD-10-CM | POA: Diagnosis not present

## 2018-03-25 DIAGNOSIS — K769 Liver disease, unspecified: Secondary | ICD-10-CM | POA: Diagnosis not present

## 2018-03-25 DIAGNOSIS — D509 Iron deficiency anemia, unspecified: Secondary | ICD-10-CM | POA: Diagnosis not present

## 2018-03-25 DIAGNOSIS — N2581 Secondary hyperparathyroidism of renal origin: Secondary | ICD-10-CM | POA: Diagnosis not present

## 2018-03-25 DIAGNOSIS — N186 End stage renal disease: Secondary | ICD-10-CM | POA: Diagnosis not present

## 2018-03-26 DIAGNOSIS — N186 End stage renal disease: Secondary | ICD-10-CM | POA: Diagnosis not present

## 2018-03-26 DIAGNOSIS — Z4932 Encounter for adequacy testing for peritoneal dialysis: Secondary | ICD-10-CM | POA: Diagnosis not present

## 2018-03-26 DIAGNOSIS — D509 Iron deficiency anemia, unspecified: Secondary | ICD-10-CM | POA: Diagnosis not present

## 2018-03-26 DIAGNOSIS — D631 Anemia in chronic kidney disease: Secondary | ICD-10-CM | POA: Diagnosis not present

## 2018-03-26 DIAGNOSIS — N2581 Secondary hyperparathyroidism of renal origin: Secondary | ICD-10-CM | POA: Diagnosis not present

## 2018-03-26 DIAGNOSIS — K769 Liver disease, unspecified: Secondary | ICD-10-CM | POA: Diagnosis not present

## 2018-03-27 DIAGNOSIS — K769 Liver disease, unspecified: Secondary | ICD-10-CM | POA: Diagnosis not present

## 2018-03-27 DIAGNOSIS — Z992 Dependence on renal dialysis: Secondary | ICD-10-CM | POA: Diagnosis not present

## 2018-03-27 DIAGNOSIS — E440 Moderate protein-calorie malnutrition: Secondary | ICD-10-CM | POA: Diagnosis not present

## 2018-03-27 DIAGNOSIS — Z5189 Encounter for other specified aftercare: Secondary | ICD-10-CM | POA: Diagnosis not present

## 2018-03-27 DIAGNOSIS — I129 Hypertensive chronic kidney disease with stage 1 through stage 4 chronic kidney disease, or unspecified chronic kidney disease: Secondary | ICD-10-CM | POA: Diagnosis not present

## 2018-03-27 DIAGNOSIS — N2581 Secondary hyperparathyroidism of renal origin: Secondary | ICD-10-CM | POA: Diagnosis not present

## 2018-03-27 DIAGNOSIS — D631 Anemia in chronic kidney disease: Secondary | ICD-10-CM | POA: Diagnosis not present

## 2018-03-27 DIAGNOSIS — N186 End stage renal disease: Secondary | ICD-10-CM | POA: Diagnosis not present

## 2018-03-27 DIAGNOSIS — Z79899 Other long term (current) drug therapy: Secondary | ICD-10-CM | POA: Diagnosis not present

## 2018-03-27 DIAGNOSIS — D509 Iron deficiency anemia, unspecified: Secondary | ICD-10-CM | POA: Diagnosis not present

## 2018-03-28 DIAGNOSIS — K769 Liver disease, unspecified: Secondary | ICD-10-CM | POA: Diagnosis not present

## 2018-03-28 DIAGNOSIS — D631 Anemia in chronic kidney disease: Secondary | ICD-10-CM | POA: Diagnosis not present

## 2018-03-28 DIAGNOSIS — N2581 Secondary hyperparathyroidism of renal origin: Secondary | ICD-10-CM | POA: Diagnosis not present

## 2018-03-28 DIAGNOSIS — Z5189 Encounter for other specified aftercare: Secondary | ICD-10-CM | POA: Diagnosis not present

## 2018-03-28 DIAGNOSIS — Z79899 Other long term (current) drug therapy: Secondary | ICD-10-CM | POA: Diagnosis not present

## 2018-03-28 DIAGNOSIS — N186 End stage renal disease: Secondary | ICD-10-CM | POA: Diagnosis not present

## 2018-03-29 DIAGNOSIS — N2581 Secondary hyperparathyroidism of renal origin: Secondary | ICD-10-CM | POA: Diagnosis not present

## 2018-03-29 DIAGNOSIS — K769 Liver disease, unspecified: Secondary | ICD-10-CM | POA: Diagnosis not present

## 2018-03-29 DIAGNOSIS — D631 Anemia in chronic kidney disease: Secondary | ICD-10-CM | POA: Diagnosis not present

## 2018-03-29 DIAGNOSIS — Z5189 Encounter for other specified aftercare: Secondary | ICD-10-CM | POA: Diagnosis not present

## 2018-03-29 DIAGNOSIS — N186 End stage renal disease: Secondary | ICD-10-CM | POA: Diagnosis not present

## 2018-03-29 DIAGNOSIS — Z79899 Other long term (current) drug therapy: Secondary | ICD-10-CM | POA: Diagnosis not present

## 2018-03-30 DIAGNOSIS — Z79899 Other long term (current) drug therapy: Secondary | ICD-10-CM | POA: Diagnosis not present

## 2018-03-30 DIAGNOSIS — Z5189 Encounter for other specified aftercare: Secondary | ICD-10-CM | POA: Diagnosis not present

## 2018-03-30 DIAGNOSIS — N186 End stage renal disease: Secondary | ICD-10-CM | POA: Diagnosis not present

## 2018-03-30 DIAGNOSIS — N2581 Secondary hyperparathyroidism of renal origin: Secondary | ICD-10-CM | POA: Diagnosis not present

## 2018-03-30 DIAGNOSIS — D631 Anemia in chronic kidney disease: Secondary | ICD-10-CM | POA: Diagnosis not present

## 2018-03-30 DIAGNOSIS — K769 Liver disease, unspecified: Secondary | ICD-10-CM | POA: Diagnosis not present

## 2018-03-31 DIAGNOSIS — N186 End stage renal disease: Secondary | ICD-10-CM | POA: Diagnosis not present

## 2018-03-31 DIAGNOSIS — Z79899 Other long term (current) drug therapy: Secondary | ICD-10-CM | POA: Diagnosis not present

## 2018-03-31 DIAGNOSIS — K769 Liver disease, unspecified: Secondary | ICD-10-CM | POA: Diagnosis not present

## 2018-03-31 DIAGNOSIS — Z5189 Encounter for other specified aftercare: Secondary | ICD-10-CM | POA: Diagnosis not present

## 2018-03-31 DIAGNOSIS — D631 Anemia in chronic kidney disease: Secondary | ICD-10-CM | POA: Diagnosis not present

## 2018-03-31 DIAGNOSIS — N2581 Secondary hyperparathyroidism of renal origin: Secondary | ICD-10-CM | POA: Diagnosis not present

## 2018-04-01 DIAGNOSIS — D631 Anemia in chronic kidney disease: Secondary | ICD-10-CM | POA: Diagnosis not present

## 2018-04-01 DIAGNOSIS — Z5189 Encounter for other specified aftercare: Secondary | ICD-10-CM | POA: Diagnosis not present

## 2018-04-01 DIAGNOSIS — N2581 Secondary hyperparathyroidism of renal origin: Secondary | ICD-10-CM | POA: Diagnosis not present

## 2018-04-01 DIAGNOSIS — K769 Liver disease, unspecified: Secondary | ICD-10-CM | POA: Diagnosis not present

## 2018-04-01 DIAGNOSIS — Z79899 Other long term (current) drug therapy: Secondary | ICD-10-CM | POA: Diagnosis not present

## 2018-04-01 DIAGNOSIS — N186 End stage renal disease: Secondary | ICD-10-CM | POA: Diagnosis not present

## 2018-04-01 DIAGNOSIS — R82998 Other abnormal findings in urine: Secondary | ICD-10-CM | POA: Diagnosis not present

## 2018-04-02 DIAGNOSIS — K769 Liver disease, unspecified: Secondary | ICD-10-CM | POA: Diagnosis not present

## 2018-04-02 DIAGNOSIS — N2581 Secondary hyperparathyroidism of renal origin: Secondary | ICD-10-CM | POA: Diagnosis not present

## 2018-04-02 DIAGNOSIS — D631 Anemia in chronic kidney disease: Secondary | ICD-10-CM | POA: Diagnosis not present

## 2018-04-02 DIAGNOSIS — Z79899 Other long term (current) drug therapy: Secondary | ICD-10-CM | POA: Diagnosis not present

## 2018-04-02 DIAGNOSIS — N186 End stage renal disease: Secondary | ICD-10-CM | POA: Diagnosis not present

## 2018-04-02 DIAGNOSIS — Z5189 Encounter for other specified aftercare: Secondary | ICD-10-CM | POA: Diagnosis not present

## 2018-04-03 DIAGNOSIS — Z5189 Encounter for other specified aftercare: Secondary | ICD-10-CM | POA: Diagnosis not present

## 2018-04-03 DIAGNOSIS — D631 Anemia in chronic kidney disease: Secondary | ICD-10-CM | POA: Diagnosis not present

## 2018-04-03 DIAGNOSIS — K769 Liver disease, unspecified: Secondary | ICD-10-CM | POA: Diagnosis not present

## 2018-04-03 DIAGNOSIS — Z79899 Other long term (current) drug therapy: Secondary | ICD-10-CM | POA: Diagnosis not present

## 2018-04-03 DIAGNOSIS — N2581 Secondary hyperparathyroidism of renal origin: Secondary | ICD-10-CM | POA: Diagnosis not present

## 2018-04-03 DIAGNOSIS — N186 End stage renal disease: Secondary | ICD-10-CM | POA: Diagnosis not present

## 2018-04-04 DIAGNOSIS — N2581 Secondary hyperparathyroidism of renal origin: Secondary | ICD-10-CM | POA: Diagnosis not present

## 2018-04-04 DIAGNOSIS — N186 End stage renal disease: Secondary | ICD-10-CM | POA: Diagnosis not present

## 2018-04-04 DIAGNOSIS — Z79899 Other long term (current) drug therapy: Secondary | ICD-10-CM | POA: Diagnosis not present

## 2018-04-04 DIAGNOSIS — D631 Anemia in chronic kidney disease: Secondary | ICD-10-CM | POA: Diagnosis not present

## 2018-04-04 DIAGNOSIS — K769 Liver disease, unspecified: Secondary | ICD-10-CM | POA: Diagnosis not present

## 2018-04-04 DIAGNOSIS — Z5189 Encounter for other specified aftercare: Secondary | ICD-10-CM | POA: Diagnosis not present

## 2018-04-05 DIAGNOSIS — K769 Liver disease, unspecified: Secondary | ICD-10-CM | POA: Diagnosis not present

## 2018-04-05 DIAGNOSIS — N2581 Secondary hyperparathyroidism of renal origin: Secondary | ICD-10-CM | POA: Diagnosis not present

## 2018-04-05 DIAGNOSIS — D631 Anemia in chronic kidney disease: Secondary | ICD-10-CM | POA: Diagnosis not present

## 2018-04-05 DIAGNOSIS — N186 End stage renal disease: Secondary | ICD-10-CM | POA: Diagnosis not present

## 2018-04-05 DIAGNOSIS — Z79899 Other long term (current) drug therapy: Secondary | ICD-10-CM | POA: Diagnosis not present

## 2018-04-05 DIAGNOSIS — Z5189 Encounter for other specified aftercare: Secondary | ICD-10-CM | POA: Diagnosis not present

## 2018-04-06 DIAGNOSIS — Z5189 Encounter for other specified aftercare: Secondary | ICD-10-CM | POA: Diagnosis not present

## 2018-04-06 DIAGNOSIS — K769 Liver disease, unspecified: Secondary | ICD-10-CM | POA: Diagnosis not present

## 2018-04-06 DIAGNOSIS — Z79899 Other long term (current) drug therapy: Secondary | ICD-10-CM | POA: Diagnosis not present

## 2018-04-06 DIAGNOSIS — D631 Anemia in chronic kidney disease: Secondary | ICD-10-CM | POA: Diagnosis not present

## 2018-04-06 DIAGNOSIS — N186 End stage renal disease: Secondary | ICD-10-CM | POA: Diagnosis not present

## 2018-04-06 DIAGNOSIS — N2581 Secondary hyperparathyroidism of renal origin: Secondary | ICD-10-CM | POA: Diagnosis not present

## 2018-04-07 DIAGNOSIS — N2581 Secondary hyperparathyroidism of renal origin: Secondary | ICD-10-CM | POA: Diagnosis not present

## 2018-04-07 DIAGNOSIS — N186 End stage renal disease: Secondary | ICD-10-CM | POA: Diagnosis not present

## 2018-04-07 DIAGNOSIS — D631 Anemia in chronic kidney disease: Secondary | ICD-10-CM | POA: Diagnosis not present

## 2018-04-07 DIAGNOSIS — K769 Liver disease, unspecified: Secondary | ICD-10-CM | POA: Diagnosis not present

## 2018-04-07 DIAGNOSIS — Z79899 Other long term (current) drug therapy: Secondary | ICD-10-CM | POA: Diagnosis not present

## 2018-04-07 DIAGNOSIS — Z5189 Encounter for other specified aftercare: Secondary | ICD-10-CM | POA: Diagnosis not present

## 2018-04-08 DIAGNOSIS — N2581 Secondary hyperparathyroidism of renal origin: Secondary | ICD-10-CM | POA: Diagnosis not present

## 2018-04-08 DIAGNOSIS — D631 Anemia in chronic kidney disease: Secondary | ICD-10-CM | POA: Diagnosis not present

## 2018-04-08 DIAGNOSIS — K769 Liver disease, unspecified: Secondary | ICD-10-CM | POA: Diagnosis not present

## 2018-04-08 DIAGNOSIS — Z5189 Encounter for other specified aftercare: Secondary | ICD-10-CM | POA: Diagnosis not present

## 2018-04-08 DIAGNOSIS — Z79899 Other long term (current) drug therapy: Secondary | ICD-10-CM | POA: Diagnosis not present

## 2018-04-08 DIAGNOSIS — N186 End stage renal disease: Secondary | ICD-10-CM | POA: Diagnosis not present

## 2018-04-09 DIAGNOSIS — N186 End stage renal disease: Secondary | ICD-10-CM | POA: Diagnosis not present

## 2018-04-09 DIAGNOSIS — Z5189 Encounter for other specified aftercare: Secondary | ICD-10-CM | POA: Diagnosis not present

## 2018-04-09 DIAGNOSIS — N2581 Secondary hyperparathyroidism of renal origin: Secondary | ICD-10-CM | POA: Diagnosis not present

## 2018-04-09 DIAGNOSIS — Z79899 Other long term (current) drug therapy: Secondary | ICD-10-CM | POA: Diagnosis not present

## 2018-04-09 DIAGNOSIS — K769 Liver disease, unspecified: Secondary | ICD-10-CM | POA: Diagnosis not present

## 2018-04-09 DIAGNOSIS — D631 Anemia in chronic kidney disease: Secondary | ICD-10-CM | POA: Diagnosis not present

## 2018-04-10 DIAGNOSIS — D631 Anemia in chronic kidney disease: Secondary | ICD-10-CM | POA: Diagnosis not present

## 2018-04-10 DIAGNOSIS — K769 Liver disease, unspecified: Secondary | ICD-10-CM | POA: Diagnosis not present

## 2018-04-10 DIAGNOSIS — T85611A Breakdown (mechanical) of intraperitoneal dialysis catheter, initial encounter: Secondary | ICD-10-CM | POA: Diagnosis not present

## 2018-04-10 DIAGNOSIS — Z992 Dependence on renal dialysis: Secondary | ICD-10-CM | POA: Diagnosis not present

## 2018-04-10 DIAGNOSIS — N186 End stage renal disease: Secondary | ICD-10-CM | POA: Diagnosis not present

## 2018-04-10 DIAGNOSIS — Z79899 Other long term (current) drug therapy: Secondary | ICD-10-CM | POA: Diagnosis not present

## 2018-04-10 DIAGNOSIS — Z5189 Encounter for other specified aftercare: Secondary | ICD-10-CM | POA: Diagnosis not present

## 2018-04-10 DIAGNOSIS — N2581 Secondary hyperparathyroidism of renal origin: Secondary | ICD-10-CM | POA: Diagnosis not present

## 2018-04-11 DIAGNOSIS — N186 End stage renal disease: Secondary | ICD-10-CM | POA: Diagnosis not present

## 2018-04-11 DIAGNOSIS — Z5189 Encounter for other specified aftercare: Secondary | ICD-10-CM | POA: Diagnosis not present

## 2018-04-11 DIAGNOSIS — Z79899 Other long term (current) drug therapy: Secondary | ICD-10-CM | POA: Diagnosis not present

## 2018-04-11 DIAGNOSIS — K769 Liver disease, unspecified: Secondary | ICD-10-CM | POA: Diagnosis not present

## 2018-04-11 DIAGNOSIS — N2581 Secondary hyperparathyroidism of renal origin: Secondary | ICD-10-CM | POA: Diagnosis not present

## 2018-04-11 DIAGNOSIS — D631 Anemia in chronic kidney disease: Secondary | ICD-10-CM | POA: Diagnosis not present

## 2018-04-12 DIAGNOSIS — Z79899 Other long term (current) drug therapy: Secondary | ICD-10-CM | POA: Diagnosis not present

## 2018-04-12 DIAGNOSIS — Z5189 Encounter for other specified aftercare: Secondary | ICD-10-CM | POA: Diagnosis not present

## 2018-04-12 DIAGNOSIS — N2581 Secondary hyperparathyroidism of renal origin: Secondary | ICD-10-CM | POA: Diagnosis not present

## 2018-04-12 DIAGNOSIS — N186 End stage renal disease: Secondary | ICD-10-CM | POA: Diagnosis not present

## 2018-04-12 DIAGNOSIS — K769 Liver disease, unspecified: Secondary | ICD-10-CM | POA: Diagnosis not present

## 2018-04-12 DIAGNOSIS — D631 Anemia in chronic kidney disease: Secondary | ICD-10-CM | POA: Diagnosis not present

## 2018-04-13 DIAGNOSIS — N186 End stage renal disease: Secondary | ICD-10-CM | POA: Diagnosis not present

## 2018-04-13 DIAGNOSIS — Z5189 Encounter for other specified aftercare: Secondary | ICD-10-CM | POA: Diagnosis not present

## 2018-04-13 DIAGNOSIS — Z79899 Other long term (current) drug therapy: Secondary | ICD-10-CM | POA: Diagnosis not present

## 2018-04-13 DIAGNOSIS — K769 Liver disease, unspecified: Secondary | ICD-10-CM | POA: Diagnosis not present

## 2018-04-13 DIAGNOSIS — N2581 Secondary hyperparathyroidism of renal origin: Secondary | ICD-10-CM | POA: Diagnosis not present

## 2018-04-13 DIAGNOSIS — D631 Anemia in chronic kidney disease: Secondary | ICD-10-CM | POA: Diagnosis not present

## 2018-04-14 DIAGNOSIS — N2581 Secondary hyperparathyroidism of renal origin: Secondary | ICD-10-CM | POA: Diagnosis not present

## 2018-04-14 DIAGNOSIS — K769 Liver disease, unspecified: Secondary | ICD-10-CM | POA: Diagnosis not present

## 2018-04-14 DIAGNOSIS — D631 Anemia in chronic kidney disease: Secondary | ICD-10-CM | POA: Diagnosis not present

## 2018-04-14 DIAGNOSIS — Z79899 Other long term (current) drug therapy: Secondary | ICD-10-CM | POA: Diagnosis not present

## 2018-04-14 DIAGNOSIS — Z5189 Encounter for other specified aftercare: Secondary | ICD-10-CM | POA: Diagnosis not present

## 2018-04-14 DIAGNOSIS — N186 End stage renal disease: Secondary | ICD-10-CM | POA: Diagnosis not present

## 2018-04-15 DIAGNOSIS — Z5189 Encounter for other specified aftercare: Secondary | ICD-10-CM | POA: Diagnosis not present

## 2018-04-15 DIAGNOSIS — N186 End stage renal disease: Secondary | ICD-10-CM | POA: Diagnosis not present

## 2018-04-15 DIAGNOSIS — N2581 Secondary hyperparathyroidism of renal origin: Secondary | ICD-10-CM | POA: Diagnosis not present

## 2018-04-15 DIAGNOSIS — D631 Anemia in chronic kidney disease: Secondary | ICD-10-CM | POA: Diagnosis not present

## 2018-04-15 DIAGNOSIS — Z79899 Other long term (current) drug therapy: Secondary | ICD-10-CM | POA: Diagnosis not present

## 2018-04-15 DIAGNOSIS — K769 Liver disease, unspecified: Secondary | ICD-10-CM | POA: Diagnosis not present

## 2018-04-16 DIAGNOSIS — K769 Liver disease, unspecified: Secondary | ICD-10-CM | POA: Diagnosis not present

## 2018-04-16 DIAGNOSIS — Z5189 Encounter for other specified aftercare: Secondary | ICD-10-CM | POA: Diagnosis not present

## 2018-04-16 DIAGNOSIS — N2581 Secondary hyperparathyroidism of renal origin: Secondary | ICD-10-CM | POA: Diagnosis not present

## 2018-04-16 DIAGNOSIS — Z79899 Other long term (current) drug therapy: Secondary | ICD-10-CM | POA: Diagnosis not present

## 2018-04-16 DIAGNOSIS — N186 End stage renal disease: Secondary | ICD-10-CM | POA: Diagnosis not present

## 2018-04-16 DIAGNOSIS — D631 Anemia in chronic kidney disease: Secondary | ICD-10-CM | POA: Diagnosis not present

## 2018-04-17 DIAGNOSIS — D631 Anemia in chronic kidney disease: Secondary | ICD-10-CM | POA: Diagnosis not present

## 2018-04-17 DIAGNOSIS — Z5189 Encounter for other specified aftercare: Secondary | ICD-10-CM | POA: Diagnosis not present

## 2018-04-17 DIAGNOSIS — N2581 Secondary hyperparathyroidism of renal origin: Secondary | ICD-10-CM | POA: Diagnosis not present

## 2018-04-17 DIAGNOSIS — Z79899 Other long term (current) drug therapy: Secondary | ICD-10-CM | POA: Diagnosis not present

## 2018-04-17 DIAGNOSIS — K769 Liver disease, unspecified: Secondary | ICD-10-CM | POA: Diagnosis not present

## 2018-04-17 DIAGNOSIS — N186 End stage renal disease: Secondary | ICD-10-CM | POA: Diagnosis not present

## 2018-04-18 DIAGNOSIS — Z79899 Other long term (current) drug therapy: Secondary | ICD-10-CM | POA: Diagnosis not present

## 2018-04-18 DIAGNOSIS — D631 Anemia in chronic kidney disease: Secondary | ICD-10-CM | POA: Diagnosis not present

## 2018-04-18 DIAGNOSIS — Z5189 Encounter for other specified aftercare: Secondary | ICD-10-CM | POA: Diagnosis not present

## 2018-04-18 DIAGNOSIS — K769 Liver disease, unspecified: Secondary | ICD-10-CM | POA: Diagnosis not present

## 2018-04-18 DIAGNOSIS — N186 End stage renal disease: Secondary | ICD-10-CM | POA: Diagnosis not present

## 2018-04-18 DIAGNOSIS — N2581 Secondary hyperparathyroidism of renal origin: Secondary | ICD-10-CM | POA: Diagnosis not present

## 2018-04-19 DIAGNOSIS — N186 End stage renal disease: Secondary | ICD-10-CM | POA: Diagnosis not present

## 2018-04-19 DIAGNOSIS — Z79899 Other long term (current) drug therapy: Secondary | ICD-10-CM | POA: Diagnosis not present

## 2018-04-19 DIAGNOSIS — D631 Anemia in chronic kidney disease: Secondary | ICD-10-CM | POA: Diagnosis not present

## 2018-04-19 DIAGNOSIS — K769 Liver disease, unspecified: Secondary | ICD-10-CM | POA: Diagnosis not present

## 2018-04-19 DIAGNOSIS — N2581 Secondary hyperparathyroidism of renal origin: Secondary | ICD-10-CM | POA: Diagnosis not present

## 2018-04-19 DIAGNOSIS — Z5189 Encounter for other specified aftercare: Secondary | ICD-10-CM | POA: Diagnosis not present

## 2018-04-20 DIAGNOSIS — Z5189 Encounter for other specified aftercare: Secondary | ICD-10-CM | POA: Diagnosis not present

## 2018-04-20 DIAGNOSIS — N186 End stage renal disease: Secondary | ICD-10-CM | POA: Diagnosis not present

## 2018-04-20 DIAGNOSIS — N2581 Secondary hyperparathyroidism of renal origin: Secondary | ICD-10-CM | POA: Diagnosis not present

## 2018-04-20 DIAGNOSIS — Z79899 Other long term (current) drug therapy: Secondary | ICD-10-CM | POA: Diagnosis not present

## 2018-04-20 DIAGNOSIS — D631 Anemia in chronic kidney disease: Secondary | ICD-10-CM | POA: Diagnosis not present

## 2018-04-20 DIAGNOSIS — K769 Liver disease, unspecified: Secondary | ICD-10-CM | POA: Diagnosis not present

## 2018-04-21 DIAGNOSIS — Z79899 Other long term (current) drug therapy: Secondary | ICD-10-CM | POA: Diagnosis not present

## 2018-04-21 DIAGNOSIS — N2581 Secondary hyperparathyroidism of renal origin: Secondary | ICD-10-CM | POA: Diagnosis not present

## 2018-04-21 DIAGNOSIS — D631 Anemia in chronic kidney disease: Secondary | ICD-10-CM | POA: Diagnosis not present

## 2018-04-21 DIAGNOSIS — K769 Liver disease, unspecified: Secondary | ICD-10-CM | POA: Diagnosis not present

## 2018-04-21 DIAGNOSIS — Z5189 Encounter for other specified aftercare: Secondary | ICD-10-CM | POA: Diagnosis not present

## 2018-04-21 DIAGNOSIS — N186 End stage renal disease: Secondary | ICD-10-CM | POA: Diagnosis not present

## 2018-04-22 DIAGNOSIS — Z79899 Other long term (current) drug therapy: Secondary | ICD-10-CM | POA: Diagnosis not present

## 2018-04-22 DIAGNOSIS — Z5189 Encounter for other specified aftercare: Secondary | ICD-10-CM | POA: Diagnosis not present

## 2018-04-22 DIAGNOSIS — N2581 Secondary hyperparathyroidism of renal origin: Secondary | ICD-10-CM | POA: Diagnosis not present

## 2018-04-22 DIAGNOSIS — N186 End stage renal disease: Secondary | ICD-10-CM | POA: Diagnosis not present

## 2018-04-22 DIAGNOSIS — K769 Liver disease, unspecified: Secondary | ICD-10-CM | POA: Diagnosis not present

## 2018-04-22 DIAGNOSIS — D631 Anemia in chronic kidney disease: Secondary | ICD-10-CM | POA: Diagnosis not present

## 2018-04-23 DIAGNOSIS — N2581 Secondary hyperparathyroidism of renal origin: Secondary | ICD-10-CM | POA: Diagnosis not present

## 2018-04-23 DIAGNOSIS — Z5189 Encounter for other specified aftercare: Secondary | ICD-10-CM | POA: Diagnosis not present

## 2018-04-23 DIAGNOSIS — Z79899 Other long term (current) drug therapy: Secondary | ICD-10-CM | POA: Diagnosis not present

## 2018-04-23 DIAGNOSIS — D631 Anemia in chronic kidney disease: Secondary | ICD-10-CM | POA: Diagnosis not present

## 2018-04-23 DIAGNOSIS — N186 End stage renal disease: Secondary | ICD-10-CM | POA: Diagnosis not present

## 2018-04-23 DIAGNOSIS — K769 Liver disease, unspecified: Secondary | ICD-10-CM | POA: Diagnosis not present

## 2018-04-24 DIAGNOSIS — K769 Liver disease, unspecified: Secondary | ICD-10-CM | POA: Diagnosis not present

## 2018-04-24 DIAGNOSIS — Z79899 Other long term (current) drug therapy: Secondary | ICD-10-CM | POA: Diagnosis not present

## 2018-04-24 DIAGNOSIS — N186 End stage renal disease: Secondary | ICD-10-CM | POA: Diagnosis not present

## 2018-04-24 DIAGNOSIS — Z5189 Encounter for other specified aftercare: Secondary | ICD-10-CM | POA: Diagnosis not present

## 2018-04-24 DIAGNOSIS — D631 Anemia in chronic kidney disease: Secondary | ICD-10-CM | POA: Diagnosis not present

## 2018-04-24 DIAGNOSIS — N2581 Secondary hyperparathyroidism of renal origin: Secondary | ICD-10-CM | POA: Diagnosis not present

## 2018-04-25 DIAGNOSIS — Z79899 Other long term (current) drug therapy: Secondary | ICD-10-CM | POA: Diagnosis not present

## 2018-04-25 DIAGNOSIS — Z5189 Encounter for other specified aftercare: Secondary | ICD-10-CM | POA: Diagnosis not present

## 2018-04-25 DIAGNOSIS — D631 Anemia in chronic kidney disease: Secondary | ICD-10-CM | POA: Diagnosis not present

## 2018-04-25 DIAGNOSIS — N186 End stage renal disease: Secondary | ICD-10-CM | POA: Diagnosis not present

## 2018-04-25 DIAGNOSIS — K769 Liver disease, unspecified: Secondary | ICD-10-CM | POA: Diagnosis not present

## 2018-04-25 DIAGNOSIS — N2581 Secondary hyperparathyroidism of renal origin: Secondary | ICD-10-CM | POA: Diagnosis not present

## 2018-04-26 DIAGNOSIS — K769 Liver disease, unspecified: Secondary | ICD-10-CM | POA: Diagnosis not present

## 2018-04-26 DIAGNOSIS — N186 End stage renal disease: Secondary | ICD-10-CM | POA: Diagnosis not present

## 2018-04-26 DIAGNOSIS — D631 Anemia in chronic kidney disease: Secondary | ICD-10-CM | POA: Diagnosis not present

## 2018-04-26 DIAGNOSIS — Z79899 Other long term (current) drug therapy: Secondary | ICD-10-CM | POA: Diagnosis not present

## 2018-04-26 DIAGNOSIS — N2581 Secondary hyperparathyroidism of renal origin: Secondary | ICD-10-CM | POA: Diagnosis not present

## 2018-04-26 DIAGNOSIS — Z5189 Encounter for other specified aftercare: Secondary | ICD-10-CM | POA: Diagnosis not present

## 2018-04-27 DIAGNOSIS — N2581 Secondary hyperparathyroidism of renal origin: Secondary | ICD-10-CM | POA: Diagnosis not present

## 2018-04-27 DIAGNOSIS — N186 End stage renal disease: Secondary | ICD-10-CM | POA: Diagnosis not present

## 2018-04-28 DIAGNOSIS — N2581 Secondary hyperparathyroidism of renal origin: Secondary | ICD-10-CM | POA: Diagnosis not present

## 2018-04-28 DIAGNOSIS — N186 End stage renal disease: Secondary | ICD-10-CM | POA: Diagnosis not present

## 2018-04-29 ENCOUNTER — Encounter (HOSPITAL_COMMUNITY): Payer: Self-pay | Admitting: Emergency Medicine

## 2018-04-29 ENCOUNTER — Inpatient Hospital Stay (HOSPITAL_COMMUNITY): Payer: Medicare Other

## 2018-04-29 ENCOUNTER — Emergency Department (HOSPITAL_COMMUNITY): Payer: Medicare Other

## 2018-04-29 ENCOUNTER — Inpatient Hospital Stay (HOSPITAL_COMMUNITY)
Admission: EM | Admit: 2018-04-29 | Discharge: 2018-05-05 | DRG: 871 | Disposition: A | Discharge status: Home Health | Payer: Medicare Other | Attending: Internal Medicine | Admitting: Internal Medicine

## 2018-04-29 ENCOUNTER — Other Ambulatory Visit: Payer: Self-pay

## 2018-04-29 DIAGNOSIS — Z781 Physical restraint status: Secondary | ICD-10-CM

## 2018-04-29 DIAGNOSIS — E1101 Type 2 diabetes mellitus with hyperosmolarity with coma: Secondary | ICD-10-CM | POA: Diagnosis present

## 2018-04-29 DIAGNOSIS — N189 Chronic kidney disease, unspecified: Secondary | ICD-10-CM

## 2018-04-29 DIAGNOSIS — E7251 Non-ketotic hyperglycinemia: Secondary | ICD-10-CM | POA: Diagnosis not present

## 2018-04-29 DIAGNOSIS — E874 Mixed disorder of acid-base balance: Secondary | ICD-10-CM | POA: Diagnosis present

## 2018-04-29 DIAGNOSIS — J9690 Respiratory failure, unspecified, unspecified whether with hypoxia or hypercapnia: Secondary | ICD-10-CM | POA: Diagnosis present

## 2018-04-29 DIAGNOSIS — Z7951 Long term (current) use of inhaled steroids: Secondary | ICD-10-CM

## 2018-04-29 DIAGNOSIS — R4701 Aphasia: Secondary | ICD-10-CM | POA: Diagnosis present

## 2018-04-29 DIAGNOSIS — R569 Unspecified convulsions: Secondary | ICD-10-CM | POA: Diagnosis not present

## 2018-04-29 DIAGNOSIS — N186 End stage renal disease: Secondary | ICD-10-CM | POA: Diagnosis present

## 2018-04-29 DIAGNOSIS — K219 Gastro-esophageal reflux disease without esophagitis: Secondary | ICD-10-CM | POA: Diagnosis present

## 2018-04-29 DIAGNOSIS — N2581 Secondary hyperparathyroidism of renal origin: Secondary | ICD-10-CM | POA: Diagnosis present

## 2018-04-29 DIAGNOSIS — I120 Hypertensive chronic kidney disease with stage 5 chronic kidney disease or end stage renal disease: Secondary | ICD-10-CM | POA: Diagnosis present

## 2018-04-29 DIAGNOSIS — J9601 Acute respiratory failure with hypoxia: Secondary | ICD-10-CM | POA: Diagnosis not present

## 2018-04-29 DIAGNOSIS — D696 Thrombocytopenia, unspecified: Secondary | ICD-10-CM | POA: Diagnosis present

## 2018-04-29 DIAGNOSIS — Z880 Allergy status to penicillin: Secondary | ICD-10-CM

## 2018-04-29 DIAGNOSIS — E1142 Type 2 diabetes mellitus with diabetic polyneuropathy: Secondary | ICD-10-CM | POA: Diagnosis present

## 2018-04-29 DIAGNOSIS — E1165 Type 2 diabetes mellitus with hyperglycemia: Secondary | ICD-10-CM | POA: Diagnosis present

## 2018-04-29 DIAGNOSIS — Z87891 Personal history of nicotine dependence: Secondary | ICD-10-CM | POA: Diagnosis not present

## 2018-04-29 DIAGNOSIS — R410 Disorientation, unspecified: Secondary | ICD-10-CM | POA: Diagnosis not present

## 2018-04-29 DIAGNOSIS — R739 Hyperglycemia, unspecified: Secondary | ICD-10-CM | POA: Diagnosis not present

## 2018-04-29 DIAGNOSIS — G9340 Encephalopathy, unspecified: Secondary | ICD-10-CM

## 2018-04-29 DIAGNOSIS — D631 Anemia in chronic kidney disease: Secondary | ICD-10-CM | POA: Diagnosis not present

## 2018-04-29 DIAGNOSIS — R402441 Other coma, without documented Glasgow coma scale score, or with partial score reported, in the field [EMT or ambulance]: Secondary | ICD-10-CM | POA: Diagnosis not present

## 2018-04-29 DIAGNOSIS — R29810 Facial weakness: Secondary | ICD-10-CM | POA: Diagnosis not present

## 2018-04-29 DIAGNOSIS — Z452 Encounter for adjustment and management of vascular access device: Secondary | ICD-10-CM | POA: Diagnosis not present

## 2018-04-29 DIAGNOSIS — R4182 Altered mental status, unspecified: Secondary | ICD-10-CM

## 2018-04-29 DIAGNOSIS — J9811 Atelectasis: Secondary | ICD-10-CM | POA: Diagnosis not present

## 2018-04-29 DIAGNOSIS — G8384 Todd's paralysis (postepileptic): Secondary | ICD-10-CM | POA: Diagnosis present

## 2018-04-29 DIAGNOSIS — E1122 Type 2 diabetes mellitus with diabetic chronic kidney disease: Secondary | ICD-10-CM | POA: Diagnosis not present

## 2018-04-29 DIAGNOSIS — Z8673 Personal history of transient ischemic attack (TIA), and cerebral infarction without residual deficits: Secondary | ICD-10-CM | POA: Diagnosis not present

## 2018-04-29 DIAGNOSIS — Z801 Family history of malignant neoplasm of trachea, bronchus and lung: Secondary | ICD-10-CM

## 2018-04-29 DIAGNOSIS — G92 Toxic encephalopathy: Secondary | ICD-10-CM | POA: Diagnosis present

## 2018-04-29 DIAGNOSIS — E870 Hyperosmolality and hypernatremia: Secondary | ICD-10-CM

## 2018-04-29 DIAGNOSIS — G40909 Epilepsy, unspecified, not intractable, without status epilepticus: Secondary | ICD-10-CM | POA: Diagnosis not present

## 2018-04-29 DIAGNOSIS — J9600 Acute respiratory failure, unspecified whether with hypoxia or hypercapnia: Secondary | ICD-10-CM

## 2018-04-29 DIAGNOSIS — I161 Hypertensive emergency: Secondary | ICD-10-CM | POA: Diagnosis not present

## 2018-04-29 DIAGNOSIS — A419 Sepsis, unspecified organism: Secondary | ICD-10-CM | POA: Diagnosis not present

## 2018-04-29 DIAGNOSIS — I169 Hypertensive crisis, unspecified: Secondary | ICD-10-CM | POA: Diagnosis not present

## 2018-04-29 DIAGNOSIS — R1310 Dysphagia, unspecified: Secondary | ICD-10-CM | POA: Diagnosis not present

## 2018-04-29 DIAGNOSIS — J690 Pneumonitis due to inhalation of food and vomit: Secondary | ICD-10-CM | POA: Diagnosis not present

## 2018-04-29 DIAGNOSIS — Z794 Long term (current) use of insulin: Secondary | ICD-10-CM | POA: Diagnosis not present

## 2018-04-29 DIAGNOSIS — C61 Malignant neoplasm of prostate: Secondary | ICD-10-CM | POA: Diagnosis present

## 2018-04-29 DIAGNOSIS — G40901 Epilepsy, unspecified, not intractable, with status epilepticus: Secondary | ICD-10-CM | POA: Diagnosis not present

## 2018-04-29 DIAGNOSIS — R0902 Hypoxemia: Secondary | ICD-10-CM | POA: Diagnosis not present

## 2018-04-29 DIAGNOSIS — R0689 Other abnormalities of breathing: Secondary | ICD-10-CM | POA: Diagnosis present

## 2018-04-29 DIAGNOSIS — I160 Hypertensive urgency: Secondary | ICD-10-CM | POA: Diagnosis not present

## 2018-04-29 DIAGNOSIS — Z4682 Encounter for fitting and adjustment of non-vascular catheter: Secondary | ICD-10-CM | POA: Diagnosis not present

## 2018-04-29 DIAGNOSIS — R456 Violent behavior: Secondary | ICD-10-CM | POA: Diagnosis not present

## 2018-04-29 DIAGNOSIS — Z9119 Patient's noncompliance with other medical treatment and regimen: Secondary | ICD-10-CM

## 2018-04-29 DIAGNOSIS — R6521 Severe sepsis with septic shock: Secondary | ICD-10-CM | POA: Diagnosis present

## 2018-04-29 DIAGNOSIS — E871 Hypo-osmolality and hyponatremia: Secondary | ICD-10-CM | POA: Diagnosis present

## 2018-04-29 DIAGNOSIS — Z992 Dependence on renal dialysis: Secondary | ICD-10-CM

## 2018-04-29 DIAGNOSIS — G9341 Metabolic encephalopathy: Secondary | ICD-10-CM

## 2018-04-29 DIAGNOSIS — R29818 Other symptoms and signs involving the nervous system: Secondary | ICD-10-CM | POA: Diagnosis not present

## 2018-04-29 LAB — GLUCOSE, CAPILLARY
GLUCOSE-CAPILLARY: 547 mg/dL — AB (ref 65–99)
GLUCOSE-CAPILLARY: 151 mg/dL — AB (ref 65–99)
GLUCOSE-CAPILLARY: 76 mg/dL (ref 65–99)
GLUCOSE-CAPILLARY: 67 mg/dL (ref 65–99)
GLUCOSE-CAPILLARY: 78 mg/dL (ref 65–99)
GLUCOSE-CAPILLARY: 153 mg/dL — AB (ref 65–99)
GLUCOSE-CAPILLARY: 186 mg/dL — AB (ref 65–99)
Glucose-Capillary: >600 mg/dL (ref 65–99)
Glucose-Capillary: 459 mg/dL — ABNORMAL HIGH (ref 65–99)
Glucose-Capillary: 416 mg/dL — ABNORMAL HIGH (ref 65–99)
Glucose-Capillary: 296 mg/dL — ABNORMAL HIGH (ref 65–99)
Glucose-Capillary: 256 mg/dL — ABNORMAL HIGH (ref 65–99)
Glucose-Capillary: 191 mg/dL — ABNORMAL HIGH (ref 65–99)
Glucose-Capillary: 89 mg/dL (ref 65–99)
Glucose-Capillary: 87 mg/dL (ref 65–99)

## 2018-04-29 LAB — I-STAT TROPONIN, ED: TROPONIN I, POC: 0.01 ng/mL (ref 0.00–0.08)

## 2018-04-29 LAB — BASIC METABOLIC PANEL
Anion gap: 19 — ABNORMAL HIGH (ref 5–15)
Anion gap: 19 — ABNORMAL HIGH (ref 5–15)
Anion gap: 15 (ref 5–15)
BUN: 59 mg/dL — AB (ref 6–20)
BUN: 60 mg/dL — ABNORMAL HIGH (ref 6–20)
BUN: 63 mg/dL — AB (ref 6–20)
CHLORIDE: 88 mmol/L — AB (ref 101–111)
CHLORIDE: 94 mmol/L — AB (ref 101–111)
CHLORIDE: 96 mmol/L — AB (ref 101–111)
CO2: 21 mmol/L — ABNORMAL LOW (ref 22–32)
CO2: 20 mmol/L — ABNORMAL LOW (ref 22–32)
CO2: 22 mmol/L (ref 22–32)
Calcium: 8.9 mg/dL (ref 8.9–10.3)
Calcium: 9 mg/dL (ref 8.9–10.3)
Calcium: 8.9 mg/dL (ref 8.9–10.3)
Creatinine, Ser: 10.14 mg/dL — ABNORMAL HIGH (ref 0.61–1.24)
Creatinine, Ser: 10.06 mg/dL — ABNORMAL HIGH (ref 0.61–1.24)
Creatinine, Ser: 10.23 mg/dL — ABNORMAL HIGH (ref 0.61–1.24)
GFR calc Af Amer: 5 mL/min — ABNORMAL LOW (ref >60)
GFR calc Af Amer: 6 mL/min — ABNORMAL LOW (ref >60)
GFR calc Af Amer: 5 mL/min — ABNORMAL LOW (ref >60)
GFR calc non Af Amer: 5 mL/min — ABNORMAL LOW (ref >60)
GFR calc non Af Amer: 5 mL/min — ABNORMAL LOW (ref >60)
GFR calc non Af Amer: 5 mL/min — ABNORMAL LOW (ref >60)
GLUCOSE: 709 mg/dL — AB (ref 65–99)
GLUCOSE: 273 mg/dL — AB (ref 65–99)
GLUCOSE: 69 mg/dL (ref 65–99)
POTASSIUM: 4.2 mmol/L (ref 3.5–5.1)
POTASSIUM: 3.7 mmol/L (ref 3.5–5.1)
POTASSIUM: 4.7 mmol/L (ref 3.5–5.1)
SODIUM: 133 mmol/L — AB (ref 135–145)
Sodium: 128 mmol/L — ABNORMAL LOW (ref 135–145)
Sodium: 133 mmol/L — ABNORMAL LOW (ref 135–145)

## 2018-04-29 LAB — LACTIC ACID, PLASMA
Lactic Acid, Venous: 5.2 mmol/L (ref 0.5–1.9)
Lactic Acid, Venous: 5.8 mmol/L (ref 0.5–1.9)
Lactic Acid, Venous: 3.3 mmol/L (ref 0.5–1.9)

## 2018-04-29 LAB — COMPREHENSIVE METABOLIC PANEL
ALT: 19 U/L (ref 17–63)
AST: 28 U/L (ref 15–41)
Albumin: 3.2 g/dL — ABNORMAL LOW (ref 3.5–5.0)
Alkaline Phosphatase: 60 U/L (ref 38–126)
Anion gap: 18 — ABNORMAL HIGH (ref 5–15)
BILIRUBIN TOTAL: 0.9 mg/dL (ref 0.3–1.2)
BUN: 56 mg/dL — AB (ref 6–20)
CO2: 22 mmol/L (ref 22–32)
CREATININE: 10.06 mg/dL — AB (ref 0.61–1.24)
Calcium: 8.9 mg/dL (ref 8.9–10.3)
Chloride: 85 mmol/L — ABNORMAL LOW (ref 101–111)
GFR calc Af Amer: 6 mL/min — ABNORMAL LOW (ref >60)
GFR, EST NON AFRICAN AMERICAN: 5 mL/min — AB (ref >60)
Glucose, Bld: 912 mg/dL (ref 65–99)
Potassium: 4.6 mmol/L (ref 3.5–5.1)
Sodium: 125 mmol/L — ABNORMAL LOW (ref 135–145)
TOTAL PROTEIN: 6.4 g/dL — AB (ref 6.5–8.1)

## 2018-04-29 LAB — RAPID URINE DRUG SCREEN, HOSP PERFORMED
AMPHETAMINES: NOT DETECTED
BARBITURATES: NOT DETECTED
Benzodiazepines: NOT DETECTED
Cocaine: NOT DETECTED
Opiates: NOT DETECTED
TETRAHYDROCANNABINOL: NOT DETECTED

## 2018-04-29 LAB — URINALYSIS, ROUTINE W REFLEX MICROSCOPIC
Bacteria, UA: NONE SEEN
Bilirubin Urine: NEGATIVE
Ketones, ur: 5 mg/dL — AB
LEUKOCYTES UA: NEGATIVE
Nitrite: NEGATIVE
PH: 9 — AB (ref 5.0–8.0)
PROTEIN: 100 mg/dL — AB
SPECIFIC GRAVITY, URINE: 1.01 (ref 1.005–1.030)

## 2018-04-29 LAB — I-STAT ARTERIAL BLOOD GAS, ED
Acid-base deficit: 3 mmol/L — ABNORMAL HIGH (ref 0.0–2.0)
BICARBONATE: 22.7 mmol/L (ref 20.0–28.0)
Bicarbonate: 26.1 mmol/L (ref 20.0–28.0)
O2 SAT: 100 %
O2 SAT: 99 %
PCO2 ART: 39.5 mmHg (ref 32.0–48.0)
PH ART: 7.344 — AB (ref 7.350–7.450)
Patient temperature: 97.1
Patient temperature: 36.6
TCO2: 28 mmol/L (ref 22–32)
TCO2: 24 mmol/L (ref 22–32)
pCO2 arterial: 47.5 mmHg (ref 32.0–48.0)
pH, Arterial: 7.365 (ref 7.350–7.450)
pO2, Arterial: 307 mmHg — ABNORMAL HIGH (ref 83.0–108.0)
pO2, Arterial: 134 mmHg — ABNORMAL HIGH (ref 83.0–108.0)

## 2018-04-29 LAB — MAGNESIUM: Magnesium: 1.6 mg/dL — ABNORMAL LOW (ref 1.7–2.4)

## 2018-04-29 LAB — APTT: aPTT: 26 seconds (ref 24–36)

## 2018-04-29 LAB — DIFFERENTIAL
ABS IMMATURE GRANULOCYTES: 0 10*3/uL (ref 0.0–0.1)
Basophils Absolute: 0 10*3/uL (ref 0.0–0.1)
Basophils Relative: 1 %
EOS ABS: 0.3 10*3/uL (ref 0.0–0.7)
Eosinophils Relative: 6 %
Immature Granulocytes: 0 %
LYMPHS ABS: 1.5 10*3/uL (ref 0.7–4.0)
Lymphocytes Relative: 34 %
MONO ABS: 0.5 10*3/uL (ref 0.1–1.0)
MONOS PCT: 11 %
NEUTROS ABS: 2.2 10*3/uL (ref 1.7–7.7)
Neutrophils Relative %: 48 %

## 2018-04-29 LAB — CBC
HCT: 36.5 % — ABNORMAL LOW (ref 39.0–52.0)
Hemoglobin: 11.7 g/dL — ABNORMAL LOW (ref 13.0–17.0)
MCH: 26.7 pg (ref 26.0–34.0)
MCHC: 32.1 g/dL (ref 30.0–36.0)
MCV: 83.3 fL (ref 78.0–100.0)
PLATELETS: 160 10*3/uL (ref 150–400)
RBC: 4.38 MIL/uL (ref 4.22–5.81)
RDW: 14.1 % (ref 11.5–15.5)
WBC: 4.5 10*3/uL (ref 4.0–10.5)

## 2018-04-29 LAB — CBG MONITORING, ED
Glucose-Capillary: >600 mg/dL (ref 65–99)
Glucose-Capillary: >600 mg/dL (ref 65–99)
Glucose-Capillary: >600 mg/dL (ref 65–99)
Glucose-Capillary: >600 mg/dL (ref 65–99)

## 2018-04-29 LAB — TROPONIN I
Troponin I: 0.03 ng/mL (ref <0.03)
Troponin I: 0.04 ng/mL (ref <0.03)

## 2018-04-29 LAB — TRIGLYCERIDES: TRIGLYCERIDES: 115 mg/dL (ref <150)

## 2018-04-29 LAB — PROCALCITONIN: PROCALCITONIN: 1.12 ng/mL

## 2018-04-29 LAB — PROTIME-INR
INR: 0.98
PROTHROMBIN TIME: 12.9 s (ref 11.4–15.2)

## 2018-04-29 LAB — MRSA PCR SCREENING: MRSA by PCR: NEGATIVE

## 2018-04-29 LAB — ETHANOL: Alcohol, Ethyl (B): <10 mg/dL (ref <10)

## 2018-04-29 LAB — BETA-HYDROXYBUTYRIC ACID: BETA-HYDROXYBUTYRIC ACID: 2.11 mmol/L — AB (ref 0.05–0.27)

## 2018-04-29 MED ORDER — MAGNESIUM SULFATE 2 GM/50ML IV SOLN
2.0000 g | Freq: Once | INTRAVENOUS | Status: DC
  Filled 2018-04-29: qty 50

## 2018-04-29 MED ORDER — PROPOFOL 1000 MG/100ML IV EMUL
INTRAVENOUS | Status: AC
  Filled 2018-04-29: qty 100

## 2018-04-29 MED ORDER — SODIUM CHLORIDE 0.9 % IV SOLN
20.0000 mg/kg | Freq: Once | INTRAVENOUS | Status: AC
  Administered 2018-04-29: 1500 mg via INTRAVENOUS
  Filled 2018-04-29: qty 30

## 2018-04-29 MED ORDER — MIDAZOLAM HCL 2 MG/2ML IJ SOLN
INTRAMUSCULAR | Status: AC
  Filled 2018-04-29: qty 2

## 2018-04-29 MED ORDER — PROPOFOL 1000 MG/100ML IV EMUL
0.0000 ug/kg/min | INTRAVENOUS | Status: DC
  Administered 2018-04-29 (×4): 50 ug/kg/min via INTRAVENOUS
  Administered 2018-04-29: 20 ug/kg/min via INTRAVENOUS
  Administered 2018-04-30: 25 ug/kg/min via INTRAVENOUS
  Administered 2018-04-30: 50 ug/kg/min via INTRAVENOUS
  Administered 2018-05-01: 35 ug/kg/min via INTRAVENOUS
  Filled 2018-04-29 (×9): qty 100

## 2018-04-29 MED ORDER — SODIUM CHLORIDE 0.9 % IV SOLN
INTRAVENOUS | Status: DC
  Administered 2018-04-29: 5.4 [IU]/h via INTRAVENOUS
  Administered 2018-04-29: 21.4 [IU]/h via INTRAVENOUS
  Administered 2018-04-30: 14.4 [IU]/h via INTRAVENOUS
  Filled 2018-04-29 (×4): qty 1

## 2018-04-29 MED ORDER — HEPARIN 1000 UNIT/ML FOR PERITONEAL DIALYSIS: 500.0000 [IU] | INTRAMUSCULAR | Status: DC | PRN

## 2018-04-29 MED ORDER — PANTOPRAZOLE SODIUM 40 MG PO PACK
40.0000 mg | PACK | Freq: Every day | ORAL | Status: DC
  Administered 2018-04-29 – 2018-05-02 (×4): 40 mg
  Filled 2018-04-29 (×4): qty 20

## 2018-04-29 MED ORDER — SODIUM CHLORIDE 0.9 % IV SOLN
Freq: Once | INTRAVENOUS | Status: AC
  Administered 2018-04-29: 04:00:00 via INTRAVENOUS

## 2018-04-29 MED ORDER — CHLORHEXIDINE GLUCONATE 0.12% ORAL RINSE (MEDLINE KIT)
15.0000 mL | Freq: Two times a day (BID) | OROMUCOSAL | Status: DC
  Administered 2018-04-29 – 2018-05-02 (×7): 15 mL via OROMUCOSAL

## 2018-04-29 MED ORDER — LORAZEPAM 2 MG/ML IJ SOLN
INTRAMUSCULAR | Status: AC
  Administered 2018-04-29: 2 mg
  Filled 2018-04-29: qty 1

## 2018-04-29 MED ORDER — ORAL CARE MOUTH RINSE
15.0000 mL | OROMUCOSAL | Status: DC
  Administered 2018-04-29 – 2018-05-02 (×30): 15 mL via OROMUCOSAL

## 2018-04-29 MED ORDER — FENTANYL CITRATE (PF) 100 MCG/2ML IJ SOLN
100.0000 ug | INTRAMUSCULAR | Status: DC | PRN
  Administered 2018-04-29: 100 ug via INTRAVENOUS
  Filled 2018-04-29: qty 2

## 2018-04-29 MED ORDER — DELFLEX-LC/4.25% DEXTROSE 483 MOSM/L IP SOLN
INTRAPERITONEAL | Status: DC
  Administered 2018-04-29: 5000 mL via INTRAPERITONEAL

## 2018-04-29 MED ORDER — ETOMIDATE 2 MG/ML IV SOLN
20.0000 mg | Freq: Once | INTRAVENOUS | Status: AC
  Administered 2018-04-29: 20 mg via INTRAVENOUS

## 2018-04-29 MED ORDER — DEXTROSE 5 % IV SOLN
0.5000 g | Freq: Three times a day (TID) | INTRAVENOUS | Status: DC
  Filled 2018-04-29: qty 0.5

## 2018-04-29 MED ORDER — HEPARIN SODIUM (PORCINE) 5000 UNIT/ML IJ SOLN
5000.0000 [IU] | Freq: Three times a day (TID) | INTRAMUSCULAR | Status: DC
  Administered 2018-04-29 – 2018-05-05 (×17): 5000 [IU] via SUBCUTANEOUS
  Filled 2018-04-29 (×16): qty 1

## 2018-04-29 MED ORDER — GENTAMICIN SULFATE 0.1 % EX CREA
1.0000 "application " | TOPICAL_CREAM | Freq: Every day | CUTANEOUS | Status: DC
  Administered 2018-04-29 – 2018-04-30 (×2): 1 via TOPICAL
  Filled 2018-04-29: qty 15

## 2018-04-29 MED ORDER — ROCURONIUM BROMIDE 50 MG/5ML IV SOLN
40.0000 mg | Freq: Once | INTRAVENOUS | Status: AC
  Administered 2018-04-29: 40 mg via INTRAVENOUS
  Filled 2018-04-29: qty 4

## 2018-04-29 MED ORDER — LEVETIRACETAM IN NACL 1000 MG/100ML IV SOLN
1000.0000 mg | Freq: Once | INTRAVENOUS | Status: AC
  Administered 2018-04-29: 1000 mg via INTRAVENOUS
  Filled 2018-04-29: qty 100

## 2018-04-29 MED ORDER — HEPARIN 1000 UNIT/ML FOR PERITONEAL DIALYSIS: INTRAPERITONEAL | Status: DC | PRN

## 2018-04-29 MED ORDER — CEFEPIME HCL 1 G IJ SOLR
1.0000 g | INTRAMUSCULAR | Status: DC
  Administered 2018-04-29 – 2018-05-03 (×5): 1 g via INTRAVENOUS
  Filled 2018-04-29 (×6): qty 1

## 2018-04-29 MED ORDER — NICARDIPINE HCL IN NACL 20-0.86 MG/200ML-% IV SOLN
0.0000 mg/h | INTRAVENOUS | Status: DC
  Administered 2018-04-29 – 2018-04-30 (×6): 5 mg/h via INTRAVENOUS
  Filled 2018-04-29 (×5): qty 200

## 2018-04-29 MED ORDER — HYDRALAZINE HCL 20 MG/ML IJ SOLN
5.0000 mg | Freq: Four times a day (QID) | INTRAMUSCULAR | Status: DC | PRN
  Administered 2018-04-30: 10 mg via INTRAVENOUS
  Filled 2018-04-29 (×2): qty 1

## 2018-04-29 MED ORDER — FENTANYL CITRATE (PF) 100 MCG/2ML IJ SOLN
100.0000 ug | INTRAMUSCULAR | Status: DC | PRN
  Administered 2018-04-30: 100 ug via INTRAVENOUS
  Filled 2018-04-29: qty 2

## 2018-04-29 MED ORDER — MIDAZOLAM HCL 2 MG/2ML IJ SOLN
2.0000 mg | Freq: Once | INTRAMUSCULAR | Status: AC
  Administered 2018-04-29: 2 mg via INTRAVENOUS

## 2018-04-29 MED ORDER — LABETALOL HCL 5 MG/ML IV SOLN
INTRAVENOUS | Status: AC
  Administered 2018-04-29: 20 mg
  Filled 2018-04-29: qty 4

## 2018-04-29 MED ORDER — VANCOMYCIN HCL IN DEXTROSE 1-5 GM/200ML-% IV SOLN: 1000.0000 mg | Freq: Once | INTRAVENOUS | Status: DC

## 2018-04-29 MED ORDER — SODIUM CHLORIDE 0.9 % IV SOLN
250.0000 mL | INTRAVENOUS | Status: DC | PRN
Start: 2018-04-29 — End: 2018-05-04
  Administered 2018-05-01 – 2018-05-04 (×2): 250 mL via INTRAVENOUS
  Filled 2018-04-29: qty 250

## 2018-04-29 MED ORDER — LEVETIRACETAM 100 MG/ML PO SOLN
250.0000 mg | Freq: Two times a day (BID) | ORAL | Status: DC
  Administered 2018-04-29 – 2018-05-02 (×6): 250 mg
  Filled 2018-04-29 (×7): qty 2.5

## 2018-04-29 MED ORDER — SODIUM CHLORIDE 0.9 % IV SOLN
1500.0000 mg | Freq: Once | INTRAVENOUS | Status: AC
  Administered 2018-04-29: 1500 mg via INTRAVENOUS
  Filled 2018-04-29: qty 1500

## 2018-04-29 MED ORDER — AZTREONAM 2 G IJ SOLR
2.0000 g | Freq: Once | INTRAMUSCULAR | Status: AC
  Administered 2018-04-29: 2 g via INTRAVENOUS
  Filled 2018-04-29: qty 2

## 2018-04-29 MED ORDER — DEXTROSE-NACL 5-0.45 % IV SOLN
INTRAVENOUS | Status: DC
Start: 2018-04-29 — End: 2018-04-30
  Administered 2018-04-29: 18:00:00 via INTRAVENOUS

## 2018-04-29 NOTE — Progress Notes (Signed)
@   2027 Alerted MD Lin the amount of Dianeal fluid available was not enough to accommodate original order. Dr. Augustin Coupe advised  order adjustment so able to start treatment. Floor RN made aware of the customer service number which is listed on machine and provided number to the dialysis unit.

## 2018-04-29 NOTE — ED Notes (Signed)
Swallow screen not completed in ED because pt not alert and will be intubated.

## 2018-04-29 NOTE — Progress Notes (Signed)
CRITICAL VALUE ALERT  Critical Value:  Lactic Acid 5.2  Date & Time Notied:  04/29/18 at 0945  Provider Notified: Dr. Lamonte Sakai  Orders Received/Actions taken: none at this time

## 2018-04-29 NOTE — Progress Notes (Signed)
[facility] Progress Note Patient Name: Meshulem Onorato DOB: 21-Jul-1956 MRN: [id number]   Date of Service  04/29/2018  HPI/Events of Note  Last Lactic Acid @ 11:37 AM = 5.8.  eICU Interventions  Will order: 1. Trend Lactic Acid level to assess resolution.      Intervention Category Major Interventions: Acid-Base disturbance - evaluation and management  Sommer,Steven Eugene 04/29/2018, 7:58 PM

## 2018-04-29 NOTE — ED Notes (Signed)
Attempted report 

## 2018-04-29 NOTE — Progress Notes (Signed)
CRITICAL VALUE ALERT  Critical Value:  Lactic 5.8  Date & Time Notied:  04/29/18 at 12:40  Provider Notified: Dr. Lamonte Sakai advised  Orders Received/Actions taken: No new order at this time

## 2018-04-29 NOTE — Progress Notes (Signed)
Pharmacy Antibiotic Note  Jared [name] is a 62 y.o. male admitted on 04/29/2018 with sepsis.  Pharmacy was consulted for vancomycin and aztreonam dosing. Now switching to cefepime since patient has tolerated it in the past.   Plan: Stop aztreonam and start cefepime 1 gm IV Q 24 hours  Received vancomycin 1.5 gm IV once. Will redose based on levels.  Check random vanc level in 3-5 days for redose F/u cultures and clinical course   Height: 6\' 1"  (185.4 cm) Weight: 165 lb 5.5 oz (75 kg) IBW/kg (Calculated) : 79.9  Temp (24hrs), Avg:97.7 F (36.5 C), Min:95.9 F (35.5 C), Max:98.4 F (36.9 C)  Recent Labs  Lab 04/29/18 0339 04/29/18 0837  WBC 4.5  --   CREATININE 10.06* 10.14*  LATICACIDVEN  --  5.2*    Estimated Creatinine Clearance: 7.8 mL/min (A) (by C-G formula based on SCr of 10.14 mg/dL (H)).    Allergies  Allergen Reactions  . Penicillins Other (See Comments)    UNSPECIFIED REACTION  Has patient had a PCN reaction causing immediate rash, facial/tongue/throat swelling, SOB or lightheadedness with hypotension: Unk Has patient had a PCN reaction causing severe rash involving mucus membranes or skin necrosis: Unk Has patient had a PCN reaction that required hospitalization: Unk Has patient had a PCN reaction occurring within the last 10 years: Unk If all of the above answers are "NO", then may proceed with Cephalosporin use.      Thank you for allowing pharmacy to be a part of this patient's care.  Albertina Parr, PharmD., BCPS Clinical Pharmacist Clinical phone for 04/29/18 until 3:30pm: [phone] If after 3:30pm, please call main pharmacy at: [phone]

## 2018-04-29 NOTE — Consult Note (Addendum)
Neurology Consultation  Reason for Consult: Brought in as a code stroke for seizures Referring Physician: Dr. Roxanne Mins  CC: Seizures, concern for stroke  History is obtained from: EMS, family  HPI: Jared [name] is a 62 y.o. male past medical history of end-stage renal disease on dialysis, diabetes, hypertension, thyroid disease, seen in the past with provoked seizures in the setting of high blood pressure and hyperglycemia, last seen by [facility] neurology in March 2019 and follow-up for the right-sided weakness, headache and right-sided jerking movement followed by a aphasia that was found on exam at that time, brought in today as an acute code stroke when EMS was called for altered mental status and witnessed seizure activity. Patient was in his usual state of health all day, the family went sightseeing with him, and last normal was 10 PM on 04/28/2018.  At the time of EMS call, he was noted to be altered and for EMS he exhibited seizure-like activity with leftward gaze deviation. Prior notes report right-sided jerking and rightward gaze deviation. Today, on the exam, he had leftward gaze, moving left side spontaneously, not moving the right side. Likely right-sided seizures with ensuing right-sided Todd's paralysis versus ongoing seizure activity.  Last admission was prolonged with prolonged postictal aphasia and right-sided weakness.  Required intubation at that time as well.  Likely will require intubation at this time as well.  LKW: 10 PM on 04/28/2018 tpa given?: no, not a stroke likely seizure Premorbid modified Rankin scale (mRS): 1  ROS: Unable to obtain due to altered mental status.   Past Medical History:  Diagnosis Date  . Anemia   . Cancer [facility])    early prostate cancer per patient  . Chronic kidney disease    on dialysis M-W-F  . Diabetes mellitus without complication ([place])    onset as adult  . GERD (gastroesophageal reflux disease)   . Hypertension   . Seizures ([place])     pt states he thinks this is bc of his blood sugar  . Stroke [facility])    patient denies  . Thyroid disease    hyperparathyroidism    Family History  Problem Relation Age of Onset  . Cancer - Lung Mother   . Cancer - Other Father   . Diabetes Neg Hx    Social History:   reports that he quit smoking about 33 years ago. He has never used smokeless tobacco. He reports that he drinks about 0.6 oz of alcohol per week. He reports that he does not use drugs.  Medications  Current Facility-Administered Medications:  .  fosPHENYtoin (CEREBYX) 1,500 mg PE in sodium chloride 0.9 % 50 mL IVPB, 20 mg PE/kg, Intravenous, Once, Amie Portland, MD, Last Rate: 480 mL/hr at 04/29/18 0439, 1,500 mg PE at 04/29/18 0439  Current Outpatient Medications:  .  amLODipine (NORVASC) 10 MG tablet, Take 1 tablet (10 mg total) by mouth daily., Disp: 30 tablet, Rfl: 0 .  atorvastatin (LIPITOR) 20 MG tablet, Take 1 tablet (20 mg total) by mouth daily., Disp: 90 tablet, Rfl: 3 .  calcitRIOL (ROCALTROL) 0.25 MCG capsule, Take 0.25 mcg by mouth daily., Disp: , Rfl:  .  cloNIDine (CATAPRES - DOSED IN MG/24 HR) 0.3 mg/24hr patch, Place 1 patch (0.3 mg total) onto the skin once a week., Disp: 4 patch, Rfl: 12 .  docusate sodium (COLACE) 100 MG capsule, Take 1 capsule (100 mg total) by mouth every 12 (twelve) hours. (Patient taking differently: Take 100 mg by mouth daily as needed  for mild constipation. ), Disp: 60 capsule, Rfl: 0 .  fluticasone (FLONASE) 50 MCG/ACT nasal spray, Place 1 spray into both nostrils daily. (Patient taking differently: Place 1 spray into both nostrils daily as needed for allergies. ), Disp: 16 g, Rfl: 2 .  hydrALAZINE (APRESOLINE) 100 MG tablet, Take 1 tablet (100 mg total) by mouth 3 (three) times daily., Disp: 90 tablet, Rfl: 0 .  insulin NPH Human (NOVOLIN N) 100 UNIT/ML injection, Inject 20 units every morning (Patient taking differently: Inject 30 Units into the skin daily before breakfast. ), Disp:  10 mL, Rfl: 5 .  isosorbide mononitrate (IMDUR) 30 MG 24 hr tablet, TAKE 1 TABLET BY MOUTH ONCE DAILY, Disp: 90 tablet, Rfl: 1 .  levETIRAcetam (KEPPRA) 250 MG tablet, Take 1 tablet (250 mg total) by mouth 2 (two) times daily., Disp: 60 tablet, Rfl: 5 .  metoprolol tartrate (LOPRESSOR) 100 MG tablet, Take 1 tablet (100 mg total) by mouth 2 (two) times daily., Disp: 60 tablet, Rfl: 1 .  pantoprazole (PROTONIX) 40 MG tablet, TAKE 1 TABLET BY MOUTH ONCE DAILY AT 6AM, Disp: 90 tablet, Rfl: 1 .  potassium chloride SA (K-DUR,KLOR-CON) 20 MEQ tablet, Take 20 mEq by mouth daily., Disp: , Rfl:  .  sevelamer carbonate (RENVELA) 800 MG tablet, Take 3 tablets (2,400 mg total) by mouth 3 (three) times daily with meals., Disp: 240 tablet, Rfl: 1 .  traMADol (ULTRAM) 50 MG tablet, Take 1 tablet (50 mg total) by mouth every 6 (six) hours as needed., Disp: 20 tablet, Rfl: 0  Exam: Current vital signs: BP (!) 189/87   Pulse 89   Temp (!) 96.9 F (36.1 C) (Rectal)   Ht 6\' 1"  (1.854 m)   Wt 75 kg (165 lb 5.5 oz)   SpO2 96%   BMI 21.81 kg/m  Vital signs in last 24 hours: Temp:  [96.9 F (36.1 C)] 96.9 F (36.1 C) (06/03 0414) Pulse Rate:  [87-96] 89 (06/03 0430) BP: (189-193)/(87-93) 189/87 (06/03 0430) SpO2:  [95 %-100 %] 96 % (06/03 0430) Weight:  [75 kg (165 lb 5.5 oz)] 75 kg (165 lb 5.5 oz) (06/03 0408) General: Patient with a leftward gaze, seems in some discomfort. HEENT: Normocephalic atraumatic CVS: Tachycardic, extremely hypertensive Respiratory: Maintaining his airway, maintaining his sats, scattered Rales all over Abdomen: Nondistended nontender, abdominal catheter in place ?  Peritoneal dialysis catheter Extremities: Warm perfused with no edema Neurological exam Patient is very drowsy, opens eyes to noxious stimulus, does not follow any commands. He has a forced gaze to the left, that eventually had started becoming midline but did not become completely midline.  Cannot be overcome by  passive movements. Pupils are sluggish bilaterally but round and reactive to light He has corneals and has a very poor gag. He spontaneously moving the left upper and lower extremity. He initially did not move the right upper lower extremity noxious stimulus but over time started withdrawing some. Still not spontaneously moving the right upper and lower extremity. Gait and coordination cannot be tested  Labs I have reviewed labs in epic and the results pertinent to this consultation are: CBC    Component Value Date/Time   WBC 4.5 04/29/2018 0339   RBC 4.38 04/29/2018 0339   HGB 11.7 (L) 04/29/2018 0339   HCT 36.5 (L) 04/29/2018 0339   PLT 160 04/29/2018 0339   MCV 83.3 04/29/2018 0339   MCH 26.7 04/29/2018 0339   MCHC 32.1 04/29/2018 0339   RDW 14.1 04/29/2018 0339  LYMPHSABS 1.5 04/29/2018 0339   MONOABS 0.5 04/29/2018 0339   EOSABS 0.3 04/29/2018 0339   BASOSABS 0.0 04/29/2018 0339  CMP     Component Value Date/Time   NA 125 (L) 04/29/2018 0339   K 4.6 04/29/2018 0339   CL 85 (L) 04/29/2018 0339   CO2 22 04/29/2018 0339   GLUCOSE 912 (HH) 04/29/2018 0339   BUN 56 (H) 04/29/2018 0339   CREATININE 10.06 (H) 04/29/2018 0339   CREATININE 10.58 (H) 01/04/2017 1638   CALCIUM 8.9 04/29/2018 0339   PROT 6.4 (L) 04/29/2018 0339   ALBUMIN 3.2 (L) 04/29/2018 0339   AST 28 04/29/2018 0339   ALT 19 04/29/2018 0339   ALKPHOS 60 04/29/2018 0339   BILITOT 0.9 04/29/2018 0339   GFRNONAA 5 (L) 04/29/2018 0339   GFRAA 6 (L) 04/29/2018 0339  Sodium 125, glucose 912, BUN 56, creatinine 10.06  Imaging I have reviewed the images obtained: CT-scan of the brain-no acute changes  Assessment:  62 year old man past history of end-stage renal disease on dialysis, diabetes, hypertension, thyroid disease with provoked seizures in the past, currently on Keppra 250 twice daily renal dose, presents for evaluation of altered mental status in the setting of high blood pressure and  hyperglycemia Blood sugars in the 237S, systolic blood pressures in the 200s on arrival. He had a leftward gaze, not moving the right side-I suspect this is postictal Todd's paralysis. Spontaneously moving the left side. Likely provoked seizure Rule out status epilepticus  Impression -Likely provoked seizure- by hyperglycemia/hypertension/hyponatremia -Rule out status epilepticus - Hyperglycemia likely DKA -Hyponatremia (question pseudohyponatremia) - Hypertensive emergency  Recommendations: For seizure/status:  Received a total of 4 of Versed in the ambulance. Given a total of 4 of Ativan IV in the ER-still gaze to the left. Given Keppra 1 g IV x1 Ordered fosphenytoin 20 mg/kg to 20 equivalents x1 load Continue with Keppra at renal dose Stat EEG-technician called.  We will review the EEG Will need an MRI of the brain when stable Likely will require intubation as he is unable to protect his airway. Plan discussed in detail with Dr. Roxanne Mins, ED physician.  For hypertensive urgency: Gradually reduce blood pressure with no more than 25% reduction daily with eventual goal of normalizing blood pressure MRI would also be helpful to evaluate for any signs of posterior reversible encephalopathy syndrome  For hyperglycemia Management per primary team  Neurology will continue to follow with you  -- Amie Portland, MD [facility] Pager: [phone] If 7pm to 7am, please call on call as listed on AMION.   CRITICAL CARE ATTESTATION This patient is critically ill and at significant risk of neurological worsening, death and care requires constant monitoring of vital signs, hemodynamics,respiratory and cardiac monitoring. I spent 45  minutes of neurocritical care time performing neurological assessment, discussion with family, other specialists and medical decision making of high complexityin the care of  this patient.  ADDENDUM Stat EEG negative for seizure and status. Likely  post ictal now with left gaze and RSW. Possibly intubation can be avoided. Still drowsy and not following commands. Needs frequent neurochecks and meds as above. Neurology will follow  -- Amie Portland, MD [facility] Pager: [phone] If 7pm to 7am, please call on call as listed on AMION.

## 2018-04-29 NOTE — H&P (Signed)
PULMONARY / CRITICAL CARE MEDICINE   Name: Jared [name] MRN: [id number] DOB: 01-16-1956    ADMISSION DATE:  04/29/2018 CONSULTATION DATE:  04/29/2018  REFERRING MD:  Dr. Roxanne Mins   CHIEF COMPLAINT:  Hyperglycemia/Seziures   HISTORY OF PRESENT ILLNESS:   62 year old male with PMH of Anemia, ESRD on PD, DM, GERD, HTN, CVA, Hypothyroidism, Seizures   Presents to ED on 6/3 with leftward gaze deviation, right-side jerking, aphasia, and AMS. Wife states he was in usual state of health. Out all day sightseeing, skipped PD. Early this AM found to be restless and grunting. Per EMS patient noted to be 2 seizures on route to ED, given total 5 mg versed. Code Stroke Called. CT Head Negative. Glucose 912. WBC 4.5. Neurology consulted. Suspected seizure due to hyperglycemia. PCCM consulted.   Upon evaluation in ED patient not protecting airway, intubated by PCCM.    PAST MEDICAL HISTORY :  He  has a past medical history of Anemia, Cancer ([place]), Chronic kidney disease, Diabetes mellitus without complication ([place]), GERD (gastroesophageal reflux disease), Hypertension, Seizures ([place]), Stroke ([place]), and Thyroid disease.  PAST SURGICAL HISTORY: He  has a past surgical history that includes Insertion of dialysis catheter (04/2014); AV fistula placement (Left, 06/04/2014); ir generic historical (Left, 11/30/2016); ir generic historical (11/30/2016); Inguinal hernia repair (Left, 02/19/2018); and Insertion of mesh (Left, 02/19/2018).  Allergies  Allergen Reactions  . Penicillins Other (See Comments)    UNSPECIFIED REACTION  Has patient had a PCN reaction causing immediate rash, facial/tongue/throat swelling, SOB or lightheadedness with hypotension: Unk Has patient had a PCN reaction causing severe rash involving mucus membranes or skin necrosis: Unk Has patient had a PCN reaction that required hospitalization: Unk Has patient had a PCN reaction occurring within the last 10 years: Unk If all of the above answers are  "NO", then may proceed with Cephalosporin use.     No current facility-administered medications on file prior to encounter.    Current Outpatient Medications on File Prior to Encounter  Medication Sig  . amLODipine (NORVASC) 10 MG tablet Take 1 tablet (10 mg total) by mouth daily.  Marland Kitchen atorvastatin (LIPITOR) 20 MG tablet Take 1 tablet (20 mg total) by mouth daily.  . calcitRIOL (ROCALTROL) 0.25 MCG capsule Take 0.25 mcg by mouth daily.  . cloNIDine (CATAPRES - DOSED IN MG/24 HR) 0.3 mg/24hr patch Place 1 patch (0.3 mg total) onto the skin once a week.  . docusate sodium (COLACE) 100 MG capsule Take 1 capsule (100 mg total) by mouth every 12 (twelve) hours. (Patient taking differently: Take 100 mg by mouth daily as needed for mild constipation. )  . fluticasone (FLONASE) 50 MCG/ACT nasal spray Place 1 spray into both nostrils daily. (Patient taking differently: Place 1 spray into both nostrils daily as needed for allergies. )  . hydrALAZINE (APRESOLINE) 100 MG tablet Take 1 tablet (100 mg total) by mouth 3 (three) times daily.  . insulin NPH Human (NOVOLIN N) 100 UNIT/ML injection Inject 20 units every morning (Patient taking differently: Inject 30 Units into the skin daily before breakfast. )  . isosorbide mononitrate (IMDUR) 30 MG 24 hr tablet TAKE 1 TABLET BY MOUTH ONCE DAILY  . levETIRAcetam (KEPPRA) 250 MG tablet Take 1 tablet (250 mg total) by mouth 2 (two) times daily.  . metoprolol tartrate (LOPRESSOR) 100 MG tablet Take 1 tablet (100 mg total) by mouth 2 (two) times daily.  . pantoprazole (PROTONIX) 40 MG tablet TAKE 1 TABLET BY MOUTH ONCE DAILY AT  6AM  . potassium chloride SA (K-DUR,KLOR-CON) 20 MEQ tablet Take 20 mEq by mouth daily.  . sevelamer carbonate (RENVELA) 800 MG tablet Take 3 tablets (2,400 mg total) by mouth 3 (three) times daily with meals.  . traMADol (ULTRAM) 50 MG tablet Take 1 tablet (50 mg total) by mouth every 6 (six) hours as needed.    FAMILY HISTORY:  His  indicated that his mother is deceased. He indicated that his father is deceased. He indicated that his sister is alive. He indicated that the status of his neg hx is unknown.   SOCIAL HISTORY: He  reports that he quit smoking about 33 years ago. He has never used smokeless tobacco. He reports that he drinks about 0.6 oz of alcohol per week. He reports that he does not use drugs.  REVIEW OF SYSTEMS:   Unable to review as patient is encephalopathic   SUBJECTIVE:   VITAL SIGNS: BP (!) 190/78 (BP Location: Right Arm)   Pulse 93   Temp (!) 96.9 F (36.1 C) (Rectal)   Resp (!) 24   Ht 6\' 1"  (1.854 m)   Wt 75 kg (165 lb 5.5 oz)   SpO2 100%   BMI 21.81 kg/m   HEMODYNAMICS:    VENTILATOR SETTINGS:    INTAKE / OUTPUT: No intake/output data recorded.  PHYSICAL EXAMINATION: General:  Adult male, thrashing in bed  Neuro:  Does not open eyes, does no follow commands, pupils intact, -gag  HEENT:  Dry MM  Cardiovascular:  Tachy, no MRG Lungs:  Clear breath sounds, non-labored  Abdomen:  PD cath, distended, active bowel sounds  Musculoskeletal:  -edema  Skin:  Warm, dry, intact, AVF left arm   LABS:  BMET Recent Labs  Lab 04/29/18 0339  NA 125*  K 4.6  CL 85*  CO2 22  BUN 56*  CREATININE 10.06*  GLUCOSE 912*    Electrolytes Recent Labs  Lab 04/29/18 0339 04/29/18 0412  CALCIUM 8.9  --   MG  --  1.6*    CBC Recent Labs  Lab 04/29/18 0339  WBC 4.5  HGB 11.7*  HCT 36.5*  PLT 160    Coag's Recent Labs  Lab 04/29/18 0339  APTT 26  INR 0.98    Sepsis Markers No results for input(s): LATICACIDVEN, PROCALCITON, O2SATVEN in the last 168 hours.  ABG Recent Labs  Lab 04/29/18 0603  PHART 7.344*  PCO2ART 47.5  PO2ART 307.0*    Liver Enzymes Recent Labs  Lab 04/29/18 0339  AST 28  ALT 19  ALKPHOS 60  BILITOT 0.9  ALBUMIN 3.2*    Cardiac Enzymes No results for input(s): TROPONINI, PROBNP in the last 168 hours.  Glucose Recent Labs  Lab  04/29/18 0337 04/29/18 0354 04/29/18 0539  GLUCAP >600* >600* >600*    Imaging Ct Head Code Stroke Wo Contrast  Result Date: 04/29/2018 CLINICAL DATA:  Code stroke. LEFT facial droop, seizure. Follow-up code stroke. Last seen normal at 2100 hours. EXAM: CT HEAD WITHOUT CONTRAST TECHNIQUE: Contiguous axial images were obtained from the base of the skull through the vertex without intravenous contrast. COMPARISON:  MRI of the head November 27, 2017 FINDINGS: Moderately motion degraded examination. BRAIN: No intraparenchymal hemorrhage, mass effect nor midline shift. Mild parenchymal brain volume loss for age. No hydrocephalus. No acute large vascular territory infarcts. No abnormal extra-axial fluid collections. Basal cisterns are patent. VASCULAR: Mild-to-moderate calcific atherosclerosis of the carotid siphons. SKULL: No skull fracture. Focal LEFT maxillary fibrous dysplasia. No significant scalp  soft tissue swelling. LEFT suboccipital scalp lipoma. Scalp calcifications. SINUSES/ORBITS: The mastoid air-cells and included paranasal sinuses are well-aerated.The included ocular globes and orbital contents are non-suspicious. OTHER: Patient is edentulous. ASPECTS [facility] Stroke Program Early CT Score) - Ganglionic level infarction (caudate, lentiform nuclei, internal capsule, insula, M1-M3 cortex): 7 - Supraganglionic infarction (M4-M6 cortex): 3 Total score (0-10 with 10 being normal): 10 IMPRESSION: 1. No acute intracranial process on this motion degraded examination. 2. Mild parenchymal brain volume loss for age. 3. ASPECTS is 10. 4. Critical Value/emergent results text paged to [place], Neurology via [facility] system on 04/29/2018 at 3:56 am, including interpreting physician's phone number. Electronically Signed   By: Elon Alas M.D.   On: 04/29/2018 03:58     STUDIES:  CT Head 6/03 > 1. No acute intracranial process on this motion degraded examination. 2. Mild parenchymal brain volume loss  for age. CXR 6/03 >   CULTURES: Blood 6/3 >> Tracheal Asp 6/3 >> U/A 6/3 >>  ANTIBIOTICS: None   SIGNIFICANT EVENTS: 6/3 > Presents to ED   LINES/TUBES: ETT 6/3 >>  DISCUSSION: 62 year old male presents post-ictal and hyperglycemic requiring intubation for airway protection    ASSESSMENT / PLAN:  Respiratory Insufficieny in setting of encephalopathy  P:   Vent Support > TV 8cc/kg  Trend ABG/CXR Pulmonary Hygiene  VAP Bundle    HTN  P:  Cardiac Monitoring  Maintain Systolic <497  Trend Troponin   ESRD on PD  Hypomagnesemia   P:   Consul Nephrology  Trend BMP Replace electrolytes as indicated   GERD P:   NPO PPI   Anemia of Chronic Disease  Thrombocytopenia - on previous admission 153  P:  Trend CBC  Transfuse for Hemoglobin <7  Heparin SQ VTE SCDs   Aspiration Event  P:   Trend WBC and Fever Curve Trend PCT and LA  PAN culture  Monitor off antibiotics   Hyperglycemia in setting of HHS    P:   Trend BMP q4h  Trend Glucose  Insulin Gtt   Encephalopathy in setting of post-ictal state  Seizure in setting of hyperglycemia  H/O Seizures   P:   RASS goal: 0/-1 Neurology Following  Wean Propofol to achieve RASS PRN fentanyl  Given 1gm Keppra in ED, Continue Keppra 250 mg BID    FAMILY  - Updates: No family at bedside   - Inter-disciplinary family meet or Palliative Care meeting due by: 05/06/2018    Hayden Pedro, AGACNP-BC [facility]  Pgr: [phone]  PCCM Pgr: [phone]

## 2018-04-29 NOTE — Procedures (Signed)
ELECTROENCEPHALOGRAM REPORT  Date of Study: 04/29/2018  Patient's Name: Jared [name] MRN: [id number] Date of Birth: [birth year]  Referring Provider: Dr. Amie Portland  Clinical History: This is a 62 year old man with altered mental status, leftward gaze deviation.  Medications: Keppra, Ativan given prior to EEG  Technical Summary: A multichannel digital EEG recording measured by the international 10-20 system with electrodes applied with paste and impedances below 5000 ohms performed as portable with EKG monitoring in an unresponsive patient.  Hyperventilation and photic stimulation were not performed.  The digital EEG was referentially recorded, reformatted, and digitally filtered in a variety of bipolar and referential montages for optimal display.   Description: The patient is unresponsive during the recording. There is no clear posterior dominant rhythm seen. There is diffuse background suppression with low voltage delta slowing with occasional low voltage beta activity seen. Normal sleep architecture is not seen. Hyperventilation and photic stimulation were not performed. There were no epileptiform discharges or electrographic seizures seen.    EKG lead was unremarkable.  Impression: This EEG is abnormal due to moderate diffuse suppression and background slowing.  Clinical Correlation of the above findings indicates diffuse cerebral dysfunction that is non-specific in etiology and can be seen with hypoxic/ischemic injury, toxic/metabolic encephalopathies, neurodegenerative disorders, or medication effect from Ativan given prior to EEG. There were no electrographic seizures in this study.  The absence of epileptiform discharges does not rule out a clinical diagnosis of epilepsy.  Clinical correlation is advised.   Ellouise Newer, M.D.

## 2018-04-29 NOTE — Progress Notes (Addendum)
Initial Nutrition Assessment  DOCUMENTATION CODES:   Not applicable  INTERVENTION:   If unable to extubate patient within next 24 hours, recommend start TF:  Vital High Protein at 55 ml/h (1320 ml per day)  Provides 1320 kcal (1914 kcal total with Propofol), 116 gm protein, 1104 ml free water daily  NUTRITION DIAGNOSIS:   Inadequate oral intake related to inability to eat as evidenced by NPO status.  GOAL:   Patient will meet greater than or equal to 90% of their needs  MONITOR:   Vent status, Labs, I & O's  REASON FOR ASSESSMENT:   Ventilator    ASSESSMENT:   62 yo male with PMH of ESRD on PD, HTN, anemia, thyroid DZ, DM, stroke, GERD, and seizures who was admitted on 6/3 with hyperglycemia and seizures, requiring intubation for airway protection.  Discussed patient with RN today. Per review of progress notes, patient is not metabolically stable enough to begin TF today.  Male family member in room reports that patient has been eating poorly for a while. He has a decreased appetite most of the time. Weight fluctuates with fluids and changes in intake.  May need intermittent HD per progress notes. Nephrology team following. Patient is currently intubated on ventilator support MV: 13.8 L/min Temp (24hrs), Avg:97.9 F (36.6 C), Min:95.9 F (35.5 C), Max:98.8 F (37.1 C)  Propofol: 22.5 ml/hr providing 594 kcal from lipid.  Labs reviewed. Lactic acid 5.8 (H), sodium 128 (L), glucose 709 (H), potassium 4.2 (WNL) CBG's: [phone] Medications reviewed and include mag sulfate, insulin, and propofol.    NUTRITION - FOCUSED PHYSICAL EXAM:    Most Recent Value  Orbital Region  No depletion  Upper Arm Region  No depletion  Thoracic and Lumbar Region  Unable to assess  Buccal Region  Unable to assess  Temple Region  No depletion  Clavicle Bone Region  No depletion  Clavicle and Acromion Bone Region  No depletion  Scapular Bone Region  Unable to assess   Dorsal Hand  Unable to assess  Patellar Region  No depletion  Anterior Thigh Region  No depletion  Posterior Calf Region  Mild depletion  Edema (RD Assessment)  Mild  Hair  Reviewed  Eyes  Unable to assess  Mouth  Unable to assess  Skin  Reviewed  Nails  Unable to assess       Diet Order:   Diet Order           Diet NPO time specified  Diet effective now          EDUCATION NEEDS:   No education needs have been identified at this time  Skin:  Skin Assessment: Reviewed RN Assessment  Last BM:  PTA  Height:   Ht Readings from Last 1 Encounters:  04/29/18 6\' 1"  (1.854 m)    Weight:   Wt Readings from Last 1 Encounters:  04/29/18 165 lb 5.5 oz (75 kg)    Ideal Body Weight:  83.6 kg  BMI:  Body mass index is 21.81 kg/m.  Estimated Nutritional Needs:   Kcal:  1950  Protein:  105-120 gm  Fluid:  1.2 L    Molli Barrows, RD, LDN, CNSC Pager [phone] After Hours Pager [phone]

## 2018-04-29 NOTE — Progress Notes (Signed)
RT note- Patient transported to MRI and now back in ICU.

## 2018-04-29 NOTE — ED Notes (Signed)
EEG tech at bedside.  Per Dr. Rory Percy wait until EEG hooked up prior to intubation.

## 2018-04-29 NOTE — Procedures (Signed)
Endotracheal Intubation Procedure Note Indication for endotracheal intubation: airway compromise Sedation: etomidate and midazolam Paralytic: rocuronium Equipment: Macintosh 3 laryngoscope blade and 8.52mm cuffed endotracheal tube Cricoid Pressure: yes Number of attempts: 1 ETT location confirmed by by auscultation, by CXR and ETCO2 monitor.  Patient was not protecting his airway. Snoring respirations. No gag. Vomit in posterior oropharynx. Decision made to emergently intubate. Patient tolerated procedure well.

## 2018-04-29 NOTE — Progress Notes (Deleted)
MRI brain images personally reviewed. Taken together, the signal changes together with the history and severe hyperglycemia are most consistent with status epilepticus secondary to reduced seizure threshold from severe hyperglycemia. The MRI changes appear most consistent with focal cortical edema due to ongoing seizure activity. No blood seen on MRI to suggest HSV and the signal changes are well-circumscribed, with two non-contiguous regions of signal abnormality, which also would be atypical for HSV. Lack of involvement of the contralateral hemisphere significantly decreases the likelihood of limbic encephalitis, which usually presents symmetrically.   Additional recommendations: 1. Consider LP for HSV PCR, cell count, protein, glucose and IgG index, but likely to be low yield. 2. Consider empiric IV acyclovir. May wish to discuss with ID first 3. Repeat MRI brain in 3-4 days 4. Continue LTM EEG 5. Continue Keppra, Versed and propofol. The latter two medications should be continued for 24 hours followed by discontinuation of propofol Tuesday AM and re-evaluation of EEG at the bedside. If EEG shows no electrographic seizures off propofol, then Versed should be weaned next starting at about noon on Tuesday.   Electronically signed: Dr. Kerney Elbe    .

## 2018-04-29 NOTE — Progress Notes (Signed)
Stat EEG prelim read  No status  -- Amie Portland, MD [facility] Pager: [phone] If 7pm to 7am, please call on call as listed on AMION.

## 2018-04-29 NOTE — Progress Notes (Signed)
Jared [name] in MRI notified patient on greater than 4 gtts and still unstable at this time.  Will attempt to take patient for MRI later today or tonight as patient's condition improves or we are able to minimize gtts (MRI can only accommodate max of 4).

## 2018-04-29 NOTE — ED Notes (Addendum)
Dr. Rory Percy at bedside talking with family.  RT and EDP at bedside preparing to intubate pt.

## 2018-04-29 NOTE — Progress Notes (Signed)
PCCM Interval Note  Patient critically ill with organ system injuries, possible infection, all consistent with severe sepsis. Elevated lactate suggests occult septic shock. No indication right now to give the standard IVF resuscitation since he has ESRD, is hypertensive (on nicardipine). IVF boluses recommended currently by the sepsis protocol are contraindicated.   Baltazar Apo, MD, PhD 04/29/2018, 2:17 PM [facility] [phone] or if no answer [phone]

## 2018-04-29 NOTE — ED Provider Notes (Signed)
[facility] EMERGENCY DEPARTMENT Provider Note   CSN: [id number] Arrival date & time: 04/29/18  7858     History   Chief Complaint No chief complaint on file.   HPI Jared [name] is a 62 y.o. male.  The history is provided by the EMS personnel. The history is limited by the condition of the patient (Altered mental status).  He has a history of hypertension, chronic kidney disease on hemodialysis, diabetes, questionable history of seizures.  He was brought in by ambulance as a code stroke.  Called to the house because of altered mentation.  EMS reports 2 seizures in route, treated with midazolam 2.5 mg x 2.  EMS noted left gaze preference and decreased use of the right arm and leg.  CBG read high.  Last known normal was approximately 10 PM.  Past Medical History:  Diagnosis Date  . Anemia   . Cancer [facility])    early prostate cancer per patient  . Chronic kidney disease    on dialysis M-W-F  . Diabetes mellitus without complication ([place])    onset as adult  . GERD (gastroesophageal reflux disease)   . Hypertension   . Seizures ([place])    pt states he thinks this is bc of his blood sugar  . Stroke [facility])    patient denies  . Thyroid disease    hyperparathyroidism    Patient Active Problem List   Diagnosis Date Noted  . Acute metabolic encephalopathy 85/12/7739  . Metabolic encephalopathy 28/78/6767  . Encephalopathy   . Type 2 diabetes mellitus with peripheral neuropathy (HCC)   . History of prostate cancer   . Acute blood loss anemia   . Witnessed seizure-like activity ([place])   . Weakness   . Prostate cancer ([place]) 03/13/2017  . Hyperglycemia 03/13/2017  . Accelerated hypertension 03/13/2017  . Elevated LDL cholesterol level 01/24/2017  . ESRD on dialysis ([place])   . Anemia of chronic disease   . Abdominal pain 12/08/2016  . Enlarged prostate 12/08/2016  . 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 Hypertension   . CKD (chronic kidney disease) stage V requiring  chronic dialysis ([place])   . Seizure ([place]) 11/24/2016  . Status epilepticus [facility])     Past Surgical History:  Procedure Laterality Date  . AV FISTULA PLACEMENT Left 06/04/2014   Procedure: ARTERIOVENOUS (AV) FISTULA CREATION-  BRACHIOCEPHALIC WITH LIGATION OF COMPETEING BRANCH;  Surgeon: Mal Misty, MD;  Location: [place];  Service: Vascular;  Laterality: Left;  . INGUINAL HERNIA REPAIR Left 02/19/2018   Procedure: LAPAROSCOPIC LEFT  INGUINAL HERNIA;  Surgeon: Ralene Ok, MD;  Location: [place];  Service: General;  Laterality: Left;  . INSERTION OF DIALYSIS CATHETER  04/2014  . INSERTION OF MESH Left 02/19/2018   Procedure: INSERTION OF MESH;  Surgeon: Ralene Ok, MD;  Location: [place];  Service: General;  Laterality: Left;  . IR GENERIC HISTORICAL Left 11/30/2016   IR THROMBECTOMY AV FISTULA W/THROMBOLYSIS/PTA/STENT INC/SHUNT/IMG LT 11/30/2016 Markus Daft, MD [facility]-INTERV RAD  . IR GENERIC HISTORICAL  11/30/2016   IR US GUIDE VASC ACCESS LEFT 11/30/2016 Markus Daft, MD [facility]-INTER[facility] RAD        Home Medications    Prior to Admission medications   Medication Sig Start Date End Date Taking? Authorizing Provider  amLODipine (NORVASC) 10 MG tablet Take 1 tablet (10 mg total) by mouth daily. 12/02/17   Raiford Noble Latif, DO  atorvastatin (LIPITOR) 20 MG tablet Take 1 tablet (20 mg total) by mouth daily. 03/27/17  Angiulli, Lavon Paganini, PA-C  calcitRIOL (ROCALTROL) 0.25 MCG capsule Take 0.25 mcg by mouth daily.    [provider]  cloNIDine (CATAPRES - DOSED IN MG/24 HR) 0.3 mg/24hr patch Place 1 patch (0.3 mg total) onto the skin once a week. 12/01/17   Raiford Noble Latif, DO  docusate sodium (COLACE) 100 MG capsule Take 1 capsule (100 mg total) by mouth every 12 (twelve) hours. Patient taking differently: Take 100 mg by mouth daily as needed for mild constipation.  01/11/18   Charlesetta Shanks, MD  fluticasone (FLONASE) 50 MCG/ACT nasal spray Place 1 spray into both nostrils daily. Patient taking  differently: Place 1 spray into both nostrils daily as needed for allergies.  12/02/17   Raiford Noble Latif, DO  hydrALAZINE (APRESOLINE) 100 MG tablet Take 1 tablet (100 mg total) by mouth 3 (three) times daily. 03/27/17   Angiulli, Lavon Paganini, PA-C  insulin NPH Human (NOVOLIN N) 100 UNIT/ML injection Inject 20 units every morning Patient taking differently: Inject 30 Units into the skin daily before breakfast.  04/03/17   Renato Shin, MD  isosorbide mononitrate (IMDUR) 30 MG 24 hr tablet TAKE 1 TABLET BY MOUTH ONCE DAILY 02/01/18   Henson, Vickie L, NP-C  levETIRAcetam (KEPPRA) 250 MG tablet Take 1 tablet (250 mg total) by mouth 2 (two) times daily. 12/04/17   Ward Givens, NP  metoprolol tartrate (LOPRESSOR) 100 MG tablet Take 1 tablet (100 mg total) by mouth 2 (two) times daily. 05/21/17   Henson, Vickie L, NP-C  pantoprazole (PROTONIX) 40 MG tablet TAKE 1 TABLET BY MOUTH ONCE DAILY AT 6AM 02/01/18   Henson, Vickie L, NP-C  potassium chloride SA (K-DUR,KLOR-CON) 20 MEQ tablet Take 20 mEq by mouth daily.    [provider]  sevelamer carbonate (RENVELA) 800 MG tablet Take 3 tablets (2,400 mg total) by mouth 3 (three) times daily with meals. 03/27/17   Angiulli, Lavon Paganini, PA-C  traMADol (ULTRAM) 50 MG tablet Take 1 tablet (50 mg total) by mouth every 6 (six) hours as needed. 02/19/18 02/19/19  Ralene Ok, MD    Family History Family History  Problem Relation Age of Onset  . Cancer - Lung Mother   . Cancer - Other Father   . Diabetes Neg Hx     Social History Social History   Tobacco Use  . Smoking status: Former Smoker    Last attempt to quit: 05/11/1984    Years since quitting: 33.9  . Smokeless tobacco: Never Used  Substance Use Topics  . Alcohol use: Yes    Alcohol/week: 0.6 oz    Types: 1 Glasses of wine per week    Comment: occasional  . Drug use: No     Allergies   Penicillins   Review of Systems Review of Systems  Unable to perform ROS: Mental status change      Physical Exam Updated Vital Signs BP (!) 193/93   Pulse 96   SpO2 100%   Physical Exam  Nursing note and vitals reviewed.  62 year old male, resting comfortably and in no acute distress. Vital signs are significant for hypertension. Oxygen saturation is 100%, which is normal. Head is normocephalic and atraumatic. PERRLA.  He has a left gaze preference and will not look past the midline to the right.  Oropharynx is clear. Neck is nontender and supple without adenopathy or JVD. Back is nontender and there is no CVA tenderness. Lungs are clear without rales, wheezes, or rhonchi. Chest is nontender. Heart has  regular rate and rhythm without murmur. Abdomen is soft, flat, nontender without masses or hepatosplenomegaly and peristalsis is normoactive. Extremities have no cyanosis or edema, full range of motion is present.  AV fistula present in the left antecubital space with thrill present. Skin is warm and dry without rash. Neurologic: Awake but nonverbal and will not follow commands.  He moves his left arm and leg well but does not seem to be moving the right side.  There is no Babinski response.  ED Treatments / Results  Labs (all labs ordered are listed, but only abnormal results are displayed) Labs Reviewed  CBC - Abnormal; Notable for the following components:      Result Value   Hemoglobin 11.7 (*)    HCT 36.5 (*)    All other components within normal limits  COMPREHENSIVE METABOLIC PANEL - Abnormal; Notable for the following components:   Sodium 125 (*)    Chloride 85 (*)    Glucose, Bld 912 (*)    BUN 56 (*)    Creatinine, Ser 10.06 (*)    Total Protein 6.4 (*)    Albumin 3.2 (*)    GFR calc non Af Amer 5 (*)    GFR calc Af Amer 6 (*)    Anion gap 18 (*)    All other components within normal limits  MAGNESIUM - Abnormal; Notable for the following components:   Magnesium 1.6 (*)    All other components within normal limits  CBG MONITORING, ED - Abnormal; Notable  for the following components:   Glucose-Capillary >600 (*)    All other components within normal limits  CBG MONITORING, ED - Abnormal; Notable for the following components:   Glucose-Capillary >600 (*)    All other components within normal limits  CBG MONITORING, ED - Abnormal; Notable for the following components:   Glucose-Capillary >600 (*)    All other components within normal limits  I-STAT ARTERIAL BLOOD GAS, ED - Abnormal; Notable for the following components:   pH, Arterial 7.344 (*)    pO2, Arterial 307.0 (*)    All other components within normal limits  CULTURE, RESPIRATORY (NON-EXPECTORATED)  CULTURE, BLOOD (ROUTINE X 2)  CULTURE, BLOOD (ROUTINE X 2)  ETHANOL  PROTIME-INR  APTT  DIFFERENTIAL  BLOOD GAS, ARTERIAL  BASIC METABOLIC PANEL  BASIC METABOLIC PANEL  BASIC METABOLIC PANEL  BASIC METABOLIC PANEL  TROPONIN I  TROPONIN I  TROPONIN I  LACTIC ACID, PLASMA  LACTIC ACID, PLASMA  PROCALCITONIN  BETA-HYDROXYBUTYRIC ACID  HIV ANTIBODY (ROUTINE TESTING)  TRIGLYCERIDES  URINALYSIS, ROUTINE W REFLEX MICROSCOPIC  RAPID URINE DRUG SCREEN, HOSP PERFORMED  I-STAT TROPONIN, ED    EKG EKG Interpretation  Date/Time:  Monday April 29 2018 03:58:27 EDT Ventricular Rate:  100 PR Interval:    QRS Duration: 101 QT Interval:  370 QTC Calculation: 478 R Axis:   -84 Text Interpretation:  Sinus tachycardia Incomplete RBBB and LAFB Borderline prolonged QT interval When compared with ECG of 11/24/2017, No significant change was found Confirmed by Delora Fuel ([id number]) on 04/29/2018 4:46:43 AM   Radiology Dg Chest Port 1 View  Result Date: 04/29/2018 CLINICAL DATA:  Endotracheal tube and orogastric tube placement. EXAM: PORTABLE CHEST 1 VIEW COMPARISON:  Chest radiograph performed 11/24/2017 FINDINGS: The patient's endotracheal tube is seen ending 3 cm above the carina. An enteric tube is noted overlying the body of the stomach. The lungs are well-aerated. Right perihilar  airspace opacification may reflect pneumonia. There is no evidence of pleural  effusion or pneumothorax. The cardiomediastinal silhouette is within normal limits. No acute osseous abnormalities are seen. IMPRESSION: 1. Endotracheal tube seen ending 3 cm above the carina. 2. Enteric tube noted overlying the body of the stomach. 3. Right perihilar airspace opacification may reflect pneumonia. Followup PA and lateral chest X-ray is recommended in 3-4 weeks following trial of antibiotic therapy to ensure resolution and exclude underlying malignancy. Electronically Signed   By: Garald Balding M.D.   On: 04/29/2018 06:29   Ct Head Code Stroke Wo Contrast  Result Date: 04/29/2018 CLINICAL DATA:  Code stroke. LEFT facial droop, seizure. Follow-up code stroke. Last seen normal at 2100 hours. EXAM: CT HEAD WITHOUT CONTRAST TECHNIQUE: Contiguous axial images were obtained from the base of the skull through the vertex without intravenous contrast. COMPARISON:  MRI of the head November 27, 2017 FINDINGS: Moderately motion degraded examination. BRAIN: No intraparenchymal hemorrhage, mass effect nor midline shift. Mild parenchymal brain volume loss for age. No hydrocephalus. No acute large vascular territory infarcts. No abnormal extra-axial fluid collections. Basal cisterns are patent. VASCULAR: Mild-to-moderate calcific atherosclerosis of the carotid siphons. SKULL: No skull fracture. Focal LEFT maxillary fibrous dysplasia. No significant scalp soft tissue swelling. LEFT suboccipital scalp lipoma. Scalp calcifications. SINUSES/ORBITS: The mastoid air-cells and included paranasal sinuses are well-aerated.The included ocular globes and orbital contents are non-suspicious. OTHER: Patient is edentulous. ASPECTS [facility] Stroke Program Early CT Score) - Ganglionic level infarction (caudate, lentiform nuclei, internal capsule, insula, M1-M3 cortex): 7 - Supraganglionic infarction (M4-M6 cortex): 3 Total score (0-10 with 10 being  normal): 10 IMPRESSION: 1. No acute intracranial process on this motion degraded examination. 2. Mild parenchymal brain volume loss for age. 3. ASPECTS is 10. 4. Critical Value/emergent results text paged to [place], Neurology via [place] system on 04/29/2018 at 3:56 am, including interpreting physician's phone number. Electronically Signed   By: Elon Alas M.D.   On: 04/29/2018 03:58    Procedures Procedures  CRITICAL CARE Performed by: Delora Fuel Total critical care time: 90 minutes Critical care time was exclusive of separately billable procedures and treating other patients. Critical care was necessary to treat or prevent imminent or life-threatening deterioration. Critical care was time spent personally by me on the following activities: development of treatment plan with patient and/or surrogate as well as nursing, discussions with consultants, evaluation of patient's response to treatment, examination of patient, obtaining history from patient or surrogate, ordering and performing treatments and interventions, ordering and review of laboratory studies, ordering and review of radiographic studies, pulse oximetry and re-evaluation of patient's condition.  Medications Ordered in ED Medications  dextrose 5 %-0.45 % sodium chloride infusion (has no administration in time range)  insulin regular (NOVOLIN R,HUMULIN R) 100 Units in sodium chloride 0.9 % 100 mL (1 Units/mL) infusion (5.4 Units/hr Intravenous New Bag/Given 04/29/18 0545)  magnesium sulfate IVPB 2 g 50 mL (has no administration in time range)  0.9 %  sodium chloride infusion (has no administration in time range)  heparin injection 5,000 Units (has no administration in time range)  pantoprazole sodium (PROTONIX) 40 mg/20 mL oral suspension 40 mg (has no administration in time range)  fentaNYL (SUBLIMAZE) injection 100 mcg (has no administration in time range)  fentaNYL (SUBLIMAZE) injection 100 mcg (has no administration in  time range)  propofol (DIPRIVAN) 1000 MG/100ML infusion (20 mcg/kg/min  75 kg Intravenous New Bag/Given 04/29/18 0620)  levETIRAcetam (KEPPRA) 100 MG/ML solution 250 mg (has no administration in time range)  hydrALAZINE (APRESOLINE) injection 5-10 mg (has  no administration in time range)  LORazepam (ATIVAN) 2 MG/ML injection (2 mg  Given 04/29/18 0349)  levETIRAcetam (KEPPRA) IVPB 1000 mg/100 mL premix (0 mg Intravenous Stopped 04/29/18 0410)  labetalol (NORMODYNE,TRANDATE) 5 MG/ML injection (20 mg  Given 04/29/18 0401)  LORazepam (ATIVAN) 2 MG/ML injection (2 mg  Given 04/29/18 0423)  fosPHENYtoin (CEREBYX) 1,500 mg PE in sodium chloride 0.9 % 50 mL IVPB (0 mg PE/kg  75 kg Intravenous Stopped 04/29/18 0504)  0.9 %  sodium chloride infusion ( Intravenous Stopped 04/29/18 0450)  etomidate (AMIDATE) injection 20 mg (20 mg Intravenous Given 04/29/18 0609)  rocuronium (ZEMURON) injection 40 mg (40 mg Intravenous Given 04/29/18 0609)  midazolam (VERSED) injection 2 mg (2 mg Intravenous Given 04/29/18 0608)     Initial Impression / Assessment and Plan / ED Course  I have reviewed the triage vital signs and the nursing notes.  Pertinent labs & imaging results that were available during my care of the patient were reviewed by me and considered in my medical decision making (see chart for details).  Aphasic with left gaze preference and right sided weakness.  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  He is given loading dose of levetiracetam.  Old records are reviewed, and he had been admitted December 29 for encephalopathy which was related to hyperglycemia.  CBG today is high.  There is some question about whether he had a seizure.  He is started on stroke protocol.  Head CT is unremarkable.  Dr. Rory Percy of neurology service has seen the patient in conjunction with me.  Dr. Rory Percy has obtained bedside EEG which shows no evidence of ongoing seizure activity.  Patient continues to have left gaze  preference and is moving the right side significantly less than the left side.  Labs show glucose of 912.  Calculated osmolality is 320.  Hyperosmolar state is certainly contributing to his mental status change.  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  Hyperglycemia may have been the trigger for his seizures.  He continues to be hypertensive in the ED, but continues to protect his airway.  Case is discussed with Dr. Oletta Darter of critical care service who agrees to admit the patient.  Final Clinical Impressions(s) / ED Diagnoses   Final diagnoses:  Seizure ([place])  Hyperglycemia  Hyperosmolality  Hypomagnesemia  End-stage renal disease on peritoneal dialysis ([place])  Anemia associated with chronic renal failure  Metabolic encephalopathy    ED Discharge Orders    None       Delora Fuel, MD 11/65/79 [phone]

## 2018-04-29 NOTE — Consult Note (Addendum)
[facility] Renal Consultation Note  Indication for Consultation:  Management of ESRD/hemodialysis; anemia, hypertension/volume and secondary hyperparathyroidism  HPI: Jared [name] is a 62 y.o. male with ESRD  (currrently on CCPD Dr Lorrene Reid follows/ has L BC AVF  05/2014 inserted ), DM type 2, CVA , Seizures , GERD, Hyperthyroidism  admitted yesterday after found at home  With reported AMS = "restless/grunting" / EMS  At  Home noted Seizures x2 , in route to ER .In ER Intubated , bp 213/112, Glucose 912, CT Hd Neg /   CXR= R perihilar Airspace ? PNA / .   Per wife in room he missed hi PD yesterday  And not sure of other times missed. Was in his usual health until this event. Currently intubated and sedated . HO past admit January this year  Metabolic encephalopathy / HTN urgency with possible nonadherent  with PD. Not following sugars closely.     Past Medical History:  Diagnosis Date  . Anemia   . Cancer [facility])    early prostate cancer per patient  . Chronic kidney disease    on dialysis M-W-F  . Diabetes mellitus without complication ([place])    onset as adult  . GERD (gastroesophageal reflux disease)   . Hypertension   . Seizures ([place])    pt states he thinks this is bc of his blood sugar  . Stroke [facility])    patient denies  . Thyroid disease    hyperparathyroidism    Past Surgical History:  Procedure Laterality Date  . AV FISTULA PLACEMENT Left 06/04/2014   Procedure: ARTERIOVENOUS (AV) FISTULA CREATION-  BRACHIOCEPHALIC WITH LIGATION OF COMPETEING BRANCH;  Surgeon: Mal Misty, MD;  Location: [place];  Service: Vascular;  Laterality: Left;  . INGUINAL HERNIA REPAIR Left 02/19/2018   Procedure: LAPAROSCOPIC LEFT  INGUINAL HERNIA;  Surgeon: Ralene Ok, MD;  Location: [place];  Service: General;  Laterality: Left;  . INSERTION OF DIALYSIS CATHETER  04/2014  . INSERTION OF MESH Left 02/19/2018   Procedure: INSERTION OF MESH;  Surgeon: Ralene Ok, MD;  Location: [place];  Service: General;  Laterality: Left;  . IR GENERIC HISTORICAL Left 11/30/2016   IR THROMBECTOMY AV FISTULA W/THROMBOLYSIS/PTA/STENT INC/SHUNT/IMG LT 11/30/2016 Markus Daft, MD [facility]-INTERV RAD  . IR GENERIC HISTORICAL  11/30/2016   IR US GUIDE VASC ACCESS LEFT 11/30/2016 Markus Daft, MD [facility]INTER[facility] RAD      Family History  Problem Relation Age of Onset  . Cancer - Lung Mother   . Cancer - Other Father   . Diabetes Neg Hx       reports that he quit smoking about 33 years ago. He has never used smokeless tobacco. He reports that he drinks about 0.6 oz of alcohol per week. He reports that he does not use drugs.   Allergies  Allergen Reactions  . Penicillins Other (See Comments)    Tolerates cephalosporins  UNSPECIFIED REACTION  Has patient had a PCN reaction causing immediate rash, facial/tongue/throat swelling, SOB or lightheadedness with hypotension: Unk Has patient had a PCN reaction causing severe rash involving mucus membranes or skin necrosis: Unk Has patient had a PCN reaction that required hospitalization: Unk Has patient had a PCN reaction occurring within the last 10 years: Unk If all of the above answers are "NO", then may proceed with Cephalosporin use.     Prior to Admission medications   Medication Sig Start Date End Date Taking? Authorizing Provider  amLODipine (NORVASC) 10 MG tablet Take  1 tablet (10 mg total) by mouth daily. 12/02/17   Raiford Noble Latif, DO  atorvastatin (LIPITOR) 20 MG tablet Take 1 tablet (20 mg total) by mouth daily. 03/27/17   Angiulli, Lavon Paganini, PA-C  calcitRIOL (ROCALTROL) 0.25 MCG capsule Take 0.25 mcg by mouth daily.    [provider]  cloNIDine (CATAPRES - DOSED IN MG/24 HR) 0.3 mg/24hr patch Place 1 patch (0.3 mg total) onto the skin once a week. 12/01/17   Raiford Noble Latif, DO  docusate sodium (COLACE) 100 MG capsule Take 1 capsule (100 mg total) by mouth every 12 (twelve) hours. Patient taking differently: Take 100 mg by mouth daily as  needed for mild constipation.  01/11/18   Charlesetta Shanks, MD  fluticasone (FLONASE) 50 MCG/ACT nasal spray Place 1 spray into both nostrils daily. Patient taking differently: Place 1 spray into both nostrils daily as needed for allergies.  12/02/17   Raiford Noble Latif, DO  hydrALAZINE (APRESOLINE) 100 MG tablet Take 1 tablet (100 mg total) by mouth 3 (three) times daily. 03/27/17   Angiulli, Lavon Paganini, PA-C  insulin NPH Human (NOVOLIN N) 100 UNIT/ML injection Inject 20 units every morning Patient taking differently: Inject 30 Units into the skin daily before breakfast.  04/03/17   Renato Shin, MD  isosorbide mononitrate (IMDUR) 30 MG 24 hr tablet TAKE 1 TABLET BY MOUTH ONCE DAILY 02/01/18   Henson, Vickie L, NP-C  levETIRAcetam (KEPPRA) 250 MG tablet Take 1 tablet (250 mg total) by mouth 2 (two) times daily. 12/04/17   Ward Givens, NP  metoprolol tartrate (LOPRESSOR) 100 MG tablet Take 1 tablet (100 mg total) by mouth 2 (two) times daily. 05/21/17   Henson, Vickie L, NP-C  pantoprazole (PROTONIX) 40 MG tablet TAKE 1 TABLET BY MOUTH ONCE DAILY AT 6AM 02/01/18   Henson, Vickie L, NP-C  potassium chloride SA (K-DUR,KLOR-CON) 20 MEQ tablet Take 20 mEq by mouth daily.    [provider]  sevelamer carbonate (RENVELA) 800 MG tablet Take 3 tablets (2,400 mg total) by mouth 3 (three) times daily with meals. 03/27/17   Angiulli, Lavon Paganini, PA-C  traMADol (ULTRAM) 50 MG tablet Take 1 tablet (50 mg total) by mouth every 6 (six) hours as needed. 02/19/18 02/19/19  Ralene Ok, MD     Anti-infectives (From admission, onward)   Start     Dose/Rate Route Frequency Ordered Stop   04/29/18 1800  ceFEPIme (MAXIPIME) 1 g in sodium chloride 0.9 % 100 mL IVPB     1 g 200 mL/hr over 30 Minutes Intravenous Every 24 hours 04/29/18 1106     04/29/18 1400  aztreonam (AZACTAM) 0.5 g in dextrose 5 % 50 mL IVPB  Status:  Discontinued     0.5 g 100 mL/hr over 30 Minutes Intravenous Every 8 hours 04/29/18 0706 04/29/18  1103   04/29/18 0700  aztreonam (AZACTAM) 2 g in sodium chloride 0.9 % 100 mL IVPB     2 g 200 mL/hr over 30 Minutes Intravenous  Once 04/29/18 0647 04/29/18 1023   04/29/18 0700  vancomycin (VANCOCIN) IVPB 1000 mg/200 mL premix  Status:  Discontinued     1,000 mg 200 mL/hr over 60 Minutes Intravenous  Once 04/29/18 0647 04/29/18 0659   04/29/18 0700  vancomycin (VANCOCIN) 1,500 mg in sodium chloride 0.9 % 500 mL IVPB     1,500 mg 250 mL/hr over 120 Minutes Intravenous  Once 04/29/18 0659 04/29/18 1024      Results for orders placed or performed  during the hospital encounter of 04/29/18 (from the past 48 hour(s))  CBG monitoring, ED     Status: Abnormal   Collection Time: 04/29/18  3:37 AM  Result Value Ref Range   Glucose-Capillary >600 (HH) 65 - 99 mg/dL  Ethanol     Status: None   Collection Time: 04/29/18  3:39 AM  Result Value Ref Range   Alcohol, Ethyl (B) <10 <10 mg/dL    Comment: (NOTE) Lowest detectable limit for serum alcohol is 10 mg/dL. For medical purposes only. Performed at [facility], [place] [address], [place], [place] [address]   Protime-INR     Status: None   Collection Time: 04/29/18  3:39 AM  Result Value Ref Range   Prothrombin Time 12.9 11.4 - 15.2 seconds   INR 0.98     Comment: Performed at [facility] [address], [place], [place] [address]  APTT     Status: None   Collection Time: 04/29/18  3:39 AM  Result Value Ref Range   aPTT 26 24 - 36 seconds    Comment: Performed at [facility] [address] [place], [place] [address]  CBC     Status: Abnormal   Collection Time: 04/29/18  3:39 AM  Result Value Ref Range   WBC 4.5 4.0 - 10.5 K/uL   RBC 4.38 4.22 - 5.81 MIL/uL   Hemoglobin 11.7 (L) 13.0 - 17.0 g/dL   HCT 36.5 (L) 39.0 - 52.0 %   MCV 83.3 78.0 - 100.0 fL   MCH 26.7 26.0 - 34.0 pg   MCHC 32.1 30.0 - 36.0 g/dL   RDW 14.1 11.5 - 15.5 %   Platelets 160 150 - 400 K/uL    Comment: Performed at [facility],  [place] [address], [place], [place] [address]  Differential     Status: None   Collection Time: 04/29/18  3:39 AM  Result Value Ref Range   Neutrophils Relative % 48 %   Neutro Abs 2.2 1.7 - 7.7 K/uL   Lymphocytes Relative 34 %   Lymphs Abs 1.5 0.7 - 4.0 K/uL   Monocytes Relative 11 %   Monocytes Absolute 0.5 0.1 - 1.0 K/uL   Eosinophils Relative 6 %   Eosinophils Absolute 0.3 0.0 - 0.7 K/uL   Basophils Relative 1 %   Basophils Absolute 0.0 0.0 - 0.1 K/uL   Immature Granulocytes 0 %   Abs Immature Granulocytes 0.0 0.0 - 0.1 K/uL    Comment: Performed at [facility], [place] [address], [place], [place] [address]  Comprehensive metabolic panel     Status: Abnormal   Collection Time: 04/29/18  3:39 AM  Result Value Ref Range   Sodium 125 (L) 135 - 145 mmol/L   Potassium 4.6 3.5 - 5.1 mmol/L   Chloride 85 (L) 101 - 111 mmol/L   CO2 22 22 - 32 mmol/L   Glucose, Bld 912 (HH) 65 - 99 mg/dL    Comment: CRITICAL RESULT CALLED TO, READ BACK BY AND VERIFIED WITH: Donata Clay [phone] WILDERK    BUN 56 (H) 6 - 20 mg/dL   Creatinine, Ser 10.06 (H) 0.61 - 1.24 mg/dL   Calcium 8.9 8.9 - 10.3 mg/dL   Total Protein 6.4 (L) 6.5 - 8.1 g/dL   Albumin 3.2 (L) 3.5 - 5.0 g/dL   AST 28 15 - 41 U/L   ALT 19 17 - 63 U/L   Alkaline Phosphatase 60 38 -  126 U/L   Total Bilirubin 0.9 0.3 - 1.2 mg/dL   GFR calc non Af Amer 5 (L) >60 mL/min   GFR calc Af Amer 6 (L) >60 mL/min    Comment: (NOTE) The eGFR has been calculated using the CKD EPI equation. This calculation has not been validated in all clinical situations. eGFR's persistently <60 mL/min signify possible Chronic Kidney Disease.    Anion gap 18 (H) 5 - 15    Comment: Performed at [facility], [place] [address] [place], [place] [address]  I-stat troponin, ED     Status: None   Collection Time: 04/29/18  3:40 AM  Result Value Ref Range   Troponin i, poc 0.01 0.00 - 0.08 ng/mL   Comment 3            Comment: Due to the release kinetics  of cTnI, a negative result within the first hours of the onset of symptoms does not rule out myocardial infarction with certainty. If myocardial infarction is still suspected, repeat the test at appropriate intervals.   CBG monitoring, ED     Status: Abnormal   Collection Time: 04/29/18  3:54 AM  Result Value Ref Range   Glucose-Capillary >600 (HH) 65 - 99 mg/dL  Magnesium     Status: Abnormal   Collection Time: 04/29/18  4:12 AM  Result Value Ref Range   Magnesium 1.6 (L) 1.7 - 2.4 mg/dL    Comment: Performed at [facility] [address] [place], [place] [address]  CBG monitoring, ED     Status: Abnormal   Collection Time: 04/29/18  5:39 AM  Result Value Ref Range   Glucose-Capillary >600 (HH) 65 - 99 mg/dL  I-Stat arterial blood gas, ED     Status: Abnormal   Collection Time: 04/29/18  6:03 AM  Result Value Ref Range   pH, Arterial 7.344 (L) 7.350 - 7.450   pCO2 arterial 47.5 32.0 - 48.0 mmHg   pO2, Arterial 307.0 (H) 83.0 - 108.0 mmHg   Bicarbonate 26.1 20.0 - 28.0 mmol/L   TCO2 28 22 - 32 mmol/L   O2 Saturation 100.0 %   Patient temperature 97.1 F    Collection site RADIAL, ALLEN'S TEST ACCEPTABLE    Drawn by RT    Sample type ARTERIAL   Urinalysis, Routine w reflex microscopic     Status: Abnormal   Collection Time: 04/29/18  6:42 AM  Result Value Ref Range   Color, Urine STRAW (A) YELLOW   APPearance CLEAR CLEAR   Specific Gravity, Urine 1.010 1.005 - 1.030   pH 9.0 (H) 5.0 - 8.0   Glucose, UA >=500 (A) NEGATIVE mg/dL   Hgb urine dipstick MODERATE (A) NEGATIVE   Bilirubin Urine NEGATIVE NEGATIVE   Ketones, ur 5 (A) NEGATIVE mg/dL   Protein, ur 100 (A) NEGATIVE mg/dL   Nitrite NEGATIVE NEGATIVE   Leukocytes, UA NEGATIVE NEGATIVE   RBC / HPF 21-50 0 - 5 RBC/hpf   WBC, UA 0-5 0 - 5 WBC/hpf   Bacteria, UA NONE SEEN NONE SEEN    Comment: Performed at [facility], [address], [place], [place] [address]  Urine rapid drug screen (hosp performed)      Status: None   Collection Time: 04/29/18  6:42 AM  Result Value Ref Range   Opiates NONE DETECTED NONE DETECTED   Cocaine NONE DETECTED NONE DETECTED   Benzodiazepines NONE DETECTED NONE DETECTED   Amphetamines NONE DETECTED NONE DETECTED  Tetrahydrocannabinol NONE DETECTED NONE DETECTED   Barbiturates NONE DETECTED NONE DETECTED    Comment: (NOTE) DRUG SCREEN FOR MEDICAL PURPOSES ONLY.  IF CONFIRMATION IS NEEDED FOR ANY PURPOSE, NOTIFY LAB WITHIN 5 DAYS. LOWEST DETECTABLE LIMITS FOR URINE DRUG SCREEN Drug Class                     Cutoff (ng/mL) Amphetamine and metabolites    1000 Barbiturate and metabolites    200 Benzodiazepine                 778 Tricyclics and metabolites     300 Opiates and metabolites        300 Cocaine and metabolites        300 THC                            50 Performed at [facility], [place] [address] [place], [place] [address]   CBG monitoring, ED     Status: Abnormal   Collection Time: 04/29/18  6:43 AM  Result Value Ref Range   Glucose-Capillary >600 (HH) 65 - 99 mg/dL  Beta-hydroxybutyric acid     Status: Abnormal   Collection Time: 04/29/18  6:49 AM  Result Value Ref Range   Beta-Hydroxybutyric Acid 2.11 (H) 0.05 - 0.27 mmol/L    Comment: Performed at [facility] [address], [place], [place] [address]  I-Stat arterial blood gas, ED     Status: Abnormal   Collection Time: 04/29/18  7:31 AM  Result Value Ref Range   pH, Arterial 7.365 7.350 - 7.450   pCO2 arterial 39.5 32.0 - 48.0 mmHg   pO2, Arterial 134.0 (H) 83.0 - 108.0 mmHg   Bicarbonate 22.7 20.0 - 28.0 mmol/L   TCO2 24 22 - 32 mmol/L   O2 Saturation 99.0 %   Acid-base deficit 3.0 (H) 0.0 - 2.0 mmol/L   Patient temperature 36.6 C    Collection site RADIAL, ALLEN'S TEST ACCEPTABLE    Drawn by RT    Sample type ARTERIAL   CBG monitoring, ED     Status: Abnormal   Collection Time: 04/29/18  7:50 AM  Result Value Ref Range   Glucose-Capillary >600 (HH) 65 - 99  mg/dL  Glucose, capillary     Status: Abnormal   Collection Time: 04/29/18  8:23 AM  Result Value Ref Range   Glucose-Capillary >600 (HH) 65 - 99 mg/dL   Comment 1 Capillary Specimen    Comment 2 Notify RN   Basic metabolic panel     Status: Abnormal   Collection Time: 04/29/18  8:37 AM  Result Value Ref Range   Sodium 128 (L) 135 - 145 mmol/L   Potassium 4.2 3.5 - 5.1 mmol/L   Chloride 88 (L) 101 - 111 mmol/L   CO2 21 (L) 22 - 32 mmol/L   Glucose, Bld 709 (HH) 65 - 99 mg/dL    Comment: CRITICAL RESULT CALLED TO, READ BACK BY AND VERIFIED WITH: Jacklynn Barnacle RN [phone] 04/29/2018 BY A BENNETT    BUN 59 (H) 6 - 20 mg/dL   Creatinine, Ser 10.14 (H) 0.61 - 1.24 mg/dL   Calcium 8.9 8.9 - 10.3 mg/dL   GFR calc non Af Amer 5 (L) >60 mL/min   GFR calc Af Amer 5 (L) >60 mL/min    Comment: (NOTE) The eGFR has been calculated using the CKD EPI equation. This calculation has  not been validated in all clinical situations. eGFR's persistently <60 mL/min signify possible Chronic Kidney Disease.    Anion gap 19 (H) 5 - 15    Comment: Performed at [facility], [place] [address] [place], [place] [address]  Troponin I (q 6hr x 3)     Status: None   Collection Time: 04/29/18  8:37 AM  Result Value Ref Range   Troponin I <0.03 <0.03 ng/mL    Comment: Performed at [facility] [address], [place], [place] [address]  Lactic acid, plasma     Status: Abnormal   Collection Time: 04/29/18  8:37 AM  Result Value Ref Range   Lactic Acid, Venous 5.2 (HH) 0.5 - 1.9 mmol/L    Comment: CRITICAL RESULT CALLED TO, READ BACK BY AND VERIFIED WITH: D WHITE RN [phone] 04/29/2018 BY A BENNETT Performed at [facility], [place] [address], [place], [place] [address] CORRECTED ON 06/03 AT 0954: PREVIOUSLY REPORTED AS 5.2 CRITICAL RESULT CALLED TO, READ BACK BY AND VERIFIED WITH: Maud Deed RN [id number] 04/29/2018 BY A BENNETT   Procalcitonin - Baseline     Status: None   Collection Time: 04/29/18  8:37 AM   Result Value Ref Range   Procalcitonin 1.12 ng/mL    Comment:        Interpretation: PCT > 0.5 ng/mL and <= 2 ng/mL: Systemic infection (sepsis) is possible, but other conditions are known to elevate PCT as well. (NOTE)       Sepsis PCT Algorithm           Lower Respiratory Tract                                      Infection PCT Algorithm    ----------------------------     ----------------------------         PCT < 0.25 ng/mL                PCT < 0.10 ng/mL         Strongly encourage             Strongly discourage   discontinuation of antibiotics    initiation of antibiotics    ----------------------------     -----------------------------       PCT 0.25 - 0.50 ng/mL            PCT 0.10 - 0.25 ng/mL               OR       >80% decrease in PCT            Discourage initiation of                                            antibiotics      Encourage discontinuation           of antibiotics    ----------------------------     -----------------------------         PCT >= 0.50 ng/mL              PCT 0.26 - 0.50 ng/mL                AND       <80% decrease in PCT  Encourage initiation of                                             antibiotics       Encourage continuation           of antibiotics    ----------------------------     -----------------------------        PCT >= 0.50 ng/mL                  PCT > 0.50 ng/mL               AND         increase in PCT                  Strongly encourage                                      initiation of antibiotics    Strongly encourage escalation           of antibiotics                                     -----------------------------                                           PCT <= 0.25 ng/mL                                                 OR                                        > 80% decrease in PCT                                     Discontinue / Do not initiate                                              antibiotics Performed at [facility], [address] [place], [place] [address]   Triglycerides     Status: None   Collection Time: 04/29/18  8:37 AM  Result Value Ref Range   Triglycerides 115 <150 mg/dL    Comment: Performed at [facility] [address] [place], [place] [address]  MRSA PCR Screening     Status: None   Collection Time: 04/29/18  8:54 AM  Result Value Ref Range   MRSA by PCR NEGATIVE NEGATIVE    Comment:        The GeneXpert MRSA Assay (FDA approved for NASAL specimens only), is one component of a comprehensive MRSA colonization surveillance program. It is not intended to diagnose  MRSA infection nor to guide or monitor treatment for MRSA infections. Performed at [facility], [place] [address], [place], [place] [address]   Glucose, capillary     Status: Abnormal   Collection Time: 04/29/18  9:52 AM  Result Value Ref Range   Glucose-Capillary 547 (HH) 65 - 99 mg/dL   Comment 1 Capillary Specimen   Glucose, capillary     Status: Abnormal   Collection Time: 04/29/18 10:47 AM  Result Value Ref Range   Glucose-Capillary 459 (H) 65 - 99 mg/dL   Comment 1 Capillary Specimen   Lactic acid, plasma     Status: Abnormal   Collection Time: 04/29/18 11:37 AM  Result Value Ref Range   Lactic Acid, Venous 5.8 (HH) 0.5 - 1.9 mmol/L    Comment: CRITICAL RESULT CALLED TO, READ BACK BY AND VERIFIED WITH: M.DYKES,RN 1239 04/29/18 CLARK,S Performed at [facility], [place] [address] [place], [place] [address]     ROS: Intubated not able to obtain see hpi  History   Physical Exam: Vitals:   04/29/18 1145 04/29/18 1200  BP:  (!) 155/75  Pulse:  93  Resp:  (!) 22  Temp:  98.6 F (37 C)  SpO2: 100% 100%     General: Intubated  Sedated AAM NAD  HEENT: [place] , Endotracheal tube in place  Neck:  No jvd PCL Heart: RRR 2/6sem ,no rub, or gallop Lungs: Intubated with some coarse BS rhonchi bilat Abdomen: bs pos.,Soft NT, ND PD cath RUQ Extremities:  1+ pedal edema  Skin: warm cry, no overt rash  Neuro: Intubated and  Does not follow commands  Dialysis Access: LUA AVF pos bruit /thrill / PD cath in Abd  With clean dressing ,removed and no dc or erythema at PD exist site   Dialysis Orders: CCPD (Dr Lorrene Reid follows )     Assessment/Plan 1. ESRD -  WAS on CCPD at home? adherence / has  AVF /Will Discuss with Dr Koya Hunger   Doing Hemodialysis/PD  / K 4.2  Vol xs, leading to poor bp control, BS ^^ also.  These metabolic/hemodynamic changes contrib to his current status. 2.  Hypertension Urgency /volume  -  / MEDS  /As above Dialysis  Today. Not taking meds reg according to Dr. Lorrene Reid 3. Respiratory Insufficiency in setting of Encephalopathy intubated ?Asp 4.  DM  uncontrolled Hyperglycemia 5.  Anemia  - hgb 11.7 no esa needs  6. Metabolic bone disease -  Binders when pos 7. Seizures- meds    Ernest Haber, PA-C [place] [phone] 04/29/2018, 12:44 PM  I have seen and examined this patient and agree with the plan of care seen, examined, discussed with Dr. Lorrene Reid, family, PA. Nursing staff .  Jeneen Rinks [place] 04/29/2018, 1:41 PM

## 2018-04-29 NOTE — Progress Notes (Signed)
Patient transported from the ED to room 2M08 without any apparent complications.

## 2018-04-29 NOTE — Progress Notes (Signed)
PULMONARY / CRITICAL CARE MEDICINE   Name: Jared [name] MRN: [id number] DOB: Apr 19, 1956    ADMISSION DATE:  04/29/2018 CONSULTATION DATE:  04/29/2018  REFERRING MD:  Dr. Roxanne Mins   CHIEF COMPLAINT:  Hyperglycemia/Seziures   HISTORY OF PRESENT ILLNESS:   62 year old male with PMH of Anemia, ESRD on PD, DM, GERD, HTN, CVA, Hypothyroidism, Seizures   Presents to ED on 6/3 with leftward gaze deviation, right-side jerking, aphasia, and AMS. Wife states he was in usual state of health. Out all day sightseeing, skipped PD. Early this AM found to be restless and grunting. Per EMS patient noted to be 2 seizures on route to ED, given total 5 mg versed. Code Stroke Called. CT Head Negative. Glucose 912. WBC 4.5. Neurology consulted. Suspected seizure due to hyperglycemia. PCCM consulted.   Upon evaluation in ED patient not protecting airway, intubated by PCCM.     SUBJECTIVE:  EEG without any evidence of continued seizures MRI has not been done yet, he is on too many drips currently  VITAL SIGNS: BP 140/79   Pulse 94   Temp 98.1 F (36.7 C)   Resp 18   Ht 6\' 1"  (1.854 m)   Wt 75 kg (165 lb 5.5 oz)   SpO2 100%   BMI 21.81 kg/m   HEMODYNAMICS:    VENTILATOR SETTINGS: Vent Mode: PRVC FiO2 (%):  [40 %-50 %] 40 % Set Rate:  [15 bmp] 15 bmp Vt Set:  [640 mL] 640 mL PEEP:  [5 cmH20] 5 cmH20 Plateau Pressure:  [17 cmH20] 17 cmH20  INTAKE / OUTPUT: I/O last 3 completed shifts: In: 1180 [I.V.:1000; IV Piggyback:180] Out: -   PHYSICAL EXAMINATION: General: Ill-appearing gentleman, intubated, sedated Neuro: Grimace with stimulation, did not follow commands, does have a positive gag and cough HEENT: Oropharynx dry, endotracheal tube in place Cardiovascular: Tachycardic, regular, no murmur Lungs: Coarse bilateral breath sounds, no wheezing, no crackles Abdomen: Slightly distended, unable to assess tenderness, hypoactive bowel sounds, peritoneal catheter in place Musculoskeletal: No  significant edema Skin: Left upper extremity AV fistula with good thrill, no rash  LABS:  BMET Recent Labs  Lab 04/29/18 0339 04/29/18 0837  NA 125* 128*  K 4.6 4.2  CL 85* 88*  CO2 22 21*  BUN 56* 59*  CREATININE 10.06* 10.14*  GLUCOSE 912* 709*    Electrolytes Recent Labs  Lab 04/29/18 0339 04/29/18 0412 04/29/18 0837  CALCIUM 8.9  --  8.9  MG  --  1.6*  --     CBC Recent Labs  Lab 04/29/18 0339  WBC 4.5  HGB 11.7*  HCT 36.5*  PLT 160    Coag's Recent Labs  Lab 04/29/18 0339  APTT 26  INR 0.98    Sepsis Markers Recent Labs  Lab 04/29/18 0837  LATICACIDVEN 5.2*  PROCALCITON 1.12    ABG Recent Labs  Lab 04/29/18 0603 04/29/18 0731  PHART 7.344* 7.365  PCO2ART 47.5 39.5  PO2ART 307.0* 134.0*    Liver Enzymes Recent Labs  Lab 04/29/18 0339  AST 28  ALT 19  ALKPHOS 60  BILITOT 0.9  ALBUMIN 3.2*    Cardiac Enzymes Recent Labs  Lab 04/29/18 0837  TROPONINI <0.03    Glucose Recent Labs  Lab 04/29/18 0539 04/29/18 0643 04/29/18 0750 04/29/18 0823 04/29/18 0952 04/29/18 1047  GLUCAP >600* >600* >600* >600* 547* 459*    Imaging Dg Chest Port 1 View  Result Date: 04/29/2018 CLINICAL DATA:  Endotracheal tube and orogastric tube placement. EXAM: PORTABLE  CHEST 1 VIEW COMPARISON:  Chest radiograph performed 11/24/2017 FINDINGS: The patient's endotracheal tube is seen ending 3 cm above the carina. An enteric tube is noted overlying the body of the stomach. The lungs are well-aerated. Right perihilar airspace opacification may reflect pneumonia. There is no evidence of pleural effusion or pneumothorax. The cardiomediastinal silhouette is within normal limits. No acute osseous abnormalities are seen. IMPRESSION: 1. Endotracheal tube seen ending 3 cm above the carina. 2. Enteric tube noted overlying the body of the stomach. 3. Right perihilar airspace opacification may reflect pneumonia. Followup PA and lateral chest X-ray is recommended  in 3-4 weeks following trial of antibiotic therapy to ensure resolution and exclude underlying malignancy. Electronically Signed   By: Garald Balding M.D.   On: 04/29/2018 06:29   Ct Head Code Stroke Wo Contrast  Result Date: 04/29/2018 CLINICAL DATA:  Code stroke. LEFT facial droop, seizure. Follow-up code stroke. Last seen normal at 2100 hours. EXAM: CT HEAD WITHOUT CONTRAST TECHNIQUE: Contiguous axial images were obtained from the base of the skull through the vertex without intravenous contrast. COMPARISON:  MRI of the head November 27, 2017 FINDINGS: Moderately motion degraded examination. BRAIN: No intraparenchymal hemorrhage, mass effect nor midline shift. Mild parenchymal brain volume loss for age. No hydrocephalus. No acute large vascular territory infarcts. No abnormal extra-axial fluid collections. Basal cisterns are patent. VASCULAR: Mild-to-moderate calcific atherosclerosis of the carotid siphons. SKULL: No skull fracture. Focal LEFT maxillary fibrous dysplasia. No significant scalp soft tissue swelling. LEFT suboccipital scalp lipoma. Scalp calcifications. SINUSES/ORBITS: The mastoid air-cells and included paranasal sinuses are well-aerated.The included ocular globes and orbital contents are non-suspicious. OTHER: Patient is edentulous. ASPECTS [facility] Stroke Program Early CT Score) - Ganglionic level infarction (caudate, lentiform nuclei, internal capsule, insula, M1-M3 cortex): 7 - Supraganglionic infarction (M4-M6 cortex): 3 Total score (0-10 with 10 being normal): 10 IMPRESSION: 1. No acute intracranial process on this motion degraded examination. 2. Mild parenchymal brain volume loss for age. 3. ASPECTS is 10. 4. Critical Value/emergent results text paged to [place], Neurology via [place] system on 04/29/2018 at 3:56 am, including interpreting physician's phone number. Electronically Signed   By: Elon Alas M.D.   On: 04/29/2018 03:58     STUDIES:  CT Head 6/03 > 1. No acute  intracranial process on this motion degraded examination. 2. Mild parenchymal brain volume loss for age. CXR 6/03 >   CULTURES: Blood 6/3 >> Tracheal Asp 6/3 >> U/A 6/3 >> Peritoneal fluid 6/3 >>   ANTIBIOTICS: None   SIGNIFICANT EVENTS: 6/3 > Presents to ED   LINES/TUBES: ETT 6/3 >>  DISCUSSION: 61 year old male presents post-ictal and hyperglycemic requiring intubation for airway protection    ASSESSMENT / PLAN:  Respiratory Insufficieny in setting of encephalopathy  Possible aspiration pneumonia P:   Continue current ventilator support, 8 cc/kg Follow chest x-ray VAP prevention bundle Assess for wake up and spontaneous breathing once stabilizing hemodynamically and from metabolic standpoint. Antibiotics as below   HTN  P:  Currently requiring nicardipine, question whether he came off of his clonidine at home Goal systolic < 283 Follow troponin  ESRD on PD  Hypomagnesemia   Acute anion gap metabolic acidosis, lactic acidosis P:   Reviewed case with nephrology 6/3 Follow BMP May require at least intermittent hemodialysis, has left upper extremity access Replace electrolytes as indicated Follow for lactate clearance   GERD P:   Likely start tube feeding once metabolic status improved Continue PPI   Anemia of Chronic  Disease  Thrombocytopenia - on previous admission 153  P:  Follow CBC, for any evidence of active bleeding Heparin for DVT prophylaxis Transfuse for Hemoglobin <7   Aspiration Event, possible aspiration pneumonia Severe sepsis, occult septic shock (elevated lactate); at risk for peritonitis given his indwelling catheter, peritoneal dialysis P:   Started on vancomycin, aztreonam on 6/3.  He has tolerated cephalosporins in the past and we will transition the aztreonam to cefepime Blood cultures pending We will obtain respiratory culture, peritoneal fluid culture.  Severe hyperglycemia, HONC P:   Continue insulin drip until acidosis  corrected Frequent labs, BMP   Encephalopathy in setting of post-ictal state  Seizure in setting of hyperglycemia  H/O Seizures   P:   RASS goal: -2 Appreciate neurology assistance.  EEG without any continued seizure activity on propofol Wean propofol when allowable based on neurology recommendations Continue Keppra as ordered MRI brain is not yet been performed; Dr [name] referred to an MRI, question whether this was an old study because I do not see any imaging available.  FAMILY  - Updates: Dr. Lamonte Sakai discussed the case with the patient's sister at bedside 6/3  - Inter-disciplinary family meet or Palliative Care meeting due by: 05/06/2018  Independent CC time 60 minutes  Baltazar Apo, MD, PhD 04/29/2018, 11:15 AM [facility] [phone] or if no answer [phone]

## 2018-04-29 NOTE — Progress Notes (Signed)
Stat bedside EEG completed, results pending; reviewed by Rory Percy

## 2018-04-29 NOTE — ED Triage Notes (Addendum)
Pt to ED via [facility] as a Code Stroke.  Pt was in bed with wife and was restless and grunting.  Wife couldn't get patient to respond to her.  LKW 9pm.  Pt does peritoneal dialysis every night but didn't do it tonight.CBG read high.  Pt had seizure x 2 enroute to ED lasting approx 1 minute.  Total of Versed 5mg  given pta.  Pt with L gaze.  R sided weakness.  Pt incontinent of urine on arrival.

## 2018-04-29 NOTE — Progress Notes (Signed)
Pharmacy Antibiotic Note  Jared [name] is a 62 y.o. male admitted on 04/29/2018 with sepsis.  Pharmacy has been consulted for vancomycin and aztreonam dosing. Initial doses ordered in the ED.  Plan: Continue aztreonam 500 mg IV q8 hours Change initial vanc dose to 1500 mg x 1. Check random vanc level in 3-5 days for redose F/u cultures and clinical course   Height: 6\' 1"  (185.4 cm) Weight: 165 lb 5.5 oz (75 kg) IBW/kg (Calculated) : 79.9  Temp (24hrs), Avg:96.4 F (35.8 C), Min:95.9 F (35.5 C), Max:96.9 F (36.1 C)  Recent Labs  Lab 04/29/18 0339  WBC 4.5  CREATININE 10.06*    Estimated Creatinine Clearance: 7.9 mL/min (A) (by C-G formula based on SCr of 10.06 mg/dL (H)).    Allergies  Allergen Reactions  . Penicillins Other (See Comments)    UNSPECIFIED REACTION  Has patient had a PCN reaction causing immediate rash, facial/tongue/throat swelling, SOB or lightheadedness with hypotension: Unk Has patient had a PCN reaction causing severe rash involving mucus membranes or skin necrosis: Unk Has patient had a PCN reaction that required hospitalization: Unk Has patient had a PCN reaction occurring within the last 10 years: Unk If all of the above answers are "NO", then may proceed with Cephalosporin use.      Thank you for allowing pharmacy to be a part of this patient's care.  Excell Seltzer Poteet 04/29/2018 6:54 AM

## 2018-04-30 ENCOUNTER — Inpatient Hospital Stay (HOSPITAL_COMMUNITY): Payer: Medicare Other

## 2018-04-30 DIAGNOSIS — N186 End stage renal disease: Secondary | ICD-10-CM | POA: Diagnosis not present

## 2018-04-30 DIAGNOSIS — R0689 Other abnormalities of breathing: Secondary | ICD-10-CM

## 2018-04-30 DIAGNOSIS — I169 Hypertensive crisis, unspecified: Secondary | ICD-10-CM

## 2018-04-30 DIAGNOSIS — N2581 Secondary hyperparathyroidism of renal origin: Secondary | ICD-10-CM | POA: Diagnosis not present

## 2018-04-30 LAB — GLUCOSE, CAPILLARY
GLUCOSE-CAPILLARY: 158 mg/dL — AB (ref 65–99)
GLUCOSE-CAPILLARY: 159 mg/dL — AB (ref 65–99)
GLUCOSE-CAPILLARY: 202 mg/dL — AB (ref 65–99)
GLUCOSE-CAPILLARY: 203 mg/dL — AB (ref 65–99)
GLUCOSE-CAPILLARY: 207 mg/dL — AB (ref 65–99)
GLUCOSE-CAPILLARY: 212 mg/dL — AB (ref 65–99)
GLUCOSE-CAPILLARY: 222 mg/dL — AB (ref 65–99)
GLUCOSE-CAPILLARY: 394 mg/dL — AB (ref 65–99)
GLUCOSE-CAPILLARY: 88 mg/dL (ref 65–99)
GLUCOSE-CAPILLARY: 91 mg/dL (ref 65–99)
Glucose-Capillary: 152 mg/dL — ABNORMAL HIGH (ref 65–99)
Glucose-Capillary: 218 mg/dL — ABNORMAL HIGH (ref 65–99)
Glucose-Capillary: 239 mg/dL — ABNORMAL HIGH (ref 65–99)
Glucose-Capillary: 49 mg/dL — ABNORMAL LOW (ref 65–99)
Glucose-Capillary: 64 mg/dL — ABNORMAL LOW (ref 65–99)
Glucose-Capillary: 77 mg/dL (ref 65–99)
Glucose-Capillary: 92 mg/dL (ref 65–99)

## 2018-04-30 LAB — BASIC METABOLIC PANEL
Anion gap: 16 — ABNORMAL HIGH (ref 5–15)
Anion gap: 17 — ABNORMAL HIGH (ref 5–15)
BUN: 60 mg/dL — ABNORMAL HIGH (ref 6–20)
BUN: 57 mg/dL — ABNORMAL HIGH (ref 6–20)
CHLORIDE: 96 mmol/L — AB (ref 101–111)
CHLORIDE: 96 mmol/L — AB (ref 101–111)
CO2: 21 mmol/L — ABNORMAL LOW (ref 22–32)
CO2: 21 mmol/L — ABNORMAL LOW (ref 22–32)
Calcium: 8.7 mg/dL — ABNORMAL LOW (ref 8.9–10.3)
Calcium: 8.7 mg/dL — ABNORMAL LOW (ref 8.9–10.3)
Creatinine, Ser: 9.93 mg/dL — ABNORMAL HIGH (ref 0.61–1.24)
Creatinine, Ser: 9.61 mg/dL — ABNORMAL HIGH (ref 0.61–1.24)
GFR calc non Af Amer: 5 mL/min — ABNORMAL LOW (ref >60)
GFR calc non Af Amer: 5 mL/min — ABNORMAL LOW (ref >60)
GFR, EST AFRICAN AMERICAN: 6 mL/min — AB (ref >60)
GFR, EST AFRICAN AMERICAN: 6 mL/min — AB (ref >60)
Glucose, Bld: 227 mg/dL — ABNORMAL HIGH (ref 65–99)
Glucose, Bld: 223 mg/dL — ABNORMAL HIGH (ref 65–99)
POTASSIUM: 3.8 mmol/L (ref 3.5–5.1)
POTASSIUM: 3.9 mmol/L (ref 3.5–5.1)
SODIUM: 133 mmol/L — AB (ref 135–145)
SODIUM: 134 mmol/L — AB (ref 135–145)

## 2018-04-30 LAB — IRON AND TIBC
Iron: 94 ug/dL (ref 45–182)
Saturation Ratios: 61 % — ABNORMAL HIGH (ref 17.9–39.5)
TIBC: 154 ug/dL — AB (ref 250–450)
UIBC: 60 ug/dL

## 2018-04-30 LAB — CBC
HEMATOCRIT: 30.2 % — AB (ref 39.0–52.0)
HEMOGLOBIN: 10.3 g/dL — AB (ref 13.0–17.0)
MCH: 26.7 pg (ref 26.0–34.0)
MCHC: 34.1 g/dL (ref 30.0–36.0)
MCV: 78.2 fL (ref 78.0–100.0)
Platelets: 149 10*3/uL — ABNORMAL LOW (ref 150–400)
RBC: 3.86 MIL/uL — AB (ref 4.22–5.81)
RDW: 14.5 % (ref 11.5–15.5)
WBC: 7.5 10*3/uL (ref 4.0–10.5)

## 2018-04-30 LAB — HIV ANTIBODY (ROUTINE TESTING W REFLEX): HIV SCREEN 4TH GENERATION: NONREACTIVE

## 2018-04-30 LAB — PHOSPHORUS: Phosphorus: 3 mg/dL (ref 2.5–4.6)

## 2018-04-30 LAB — MAGNESIUM: Magnesium: 1.4 mg/dL — ABNORMAL LOW (ref 1.7–2.4)

## 2018-04-30 LAB — PROCALCITONIN: PROCALCITONIN: 2.83 ng/mL

## 2018-04-30 MED ORDER — DARBEPOETIN ALFA 100 MCG/0.5ML IJ SOSY
100.0000 ug | PREFILLED_SYRINGE | INTRAMUSCULAR | Status: DC
  Administered 2018-05-01: 100 ug via INTRAVENOUS
  Filled 2018-04-30: qty 0.5

## 2018-04-30 MED ORDER — METOPROLOL TARTRATE 25 MG/10 ML ORAL SUSPENSION
100.0000 mg | Freq: Two times a day (BID) | ORAL | Status: DC
  Administered 2018-04-30 – 2018-05-02 (×5): 100 mg
  Filled 2018-04-30 (×6): qty 40

## 2018-04-30 MED ORDER — IPRATROPIUM-ALBUTEROL 0.5-2.5 (3) MG/3ML IN SOLN
3.0000 mL | Freq: Four times a day (QID) | RESPIRATORY_TRACT | Status: DC
  Administered 2018-04-30 – 2018-05-02 (×9): 3 mL via RESPIRATORY_TRACT
  Filled 2018-04-30 (×10): qty 3

## 2018-04-30 MED ORDER — INSULIN ASPART 100 UNIT/ML ~~LOC~~ SOLN
0.0000 [IU] | SUBCUTANEOUS | Status: DC
  Administered 2018-04-30: 15 [IU] via SUBCUTANEOUS
  Administered 2018-04-30: 5 [IU] via SUBCUTANEOUS
  Administered 2018-05-01: 1 [IU] via SUBCUTANEOUS
  Administered 2018-05-01: 8 [IU] via SUBCUTANEOUS
  Administered 2018-05-01 (×2): 3 [IU] via SUBCUTANEOUS
  Administered 2018-05-02: 11 [IU] via SUBCUTANEOUS
  Administered 2018-05-02: 3 [IU] via SUBCUTANEOUS
  Administered 2018-05-02: 11 [IU] via SUBCUTANEOUS
  Administered 2018-05-02: 5 [IU] via SUBCUTANEOUS
  Administered 2018-05-02: 11 [IU] via SUBCUTANEOUS
  Administered 2018-05-02 (×2): 3 [IU] via SUBCUTANEOUS
  Administered 2018-05-03 (×2): 5 [IU] via SUBCUTANEOUS
  Administered 2018-05-03: 3 [IU] via SUBCUTANEOUS
  Administered 2018-05-03 – 2018-05-04 (×2): 2 [IU] via SUBCUTANEOUS
  Administered 2018-05-04: 3 [IU] via SUBCUTANEOUS

## 2018-04-30 MED ORDER — CLONIDINE ORAL SUSPENSION 10 MCG/ML
0.3000 mg | Freq: Two times a day (BID) | ORAL | Status: DC
  Filled 2018-04-30 (×2): qty 30

## 2018-04-30 MED ORDER — DEXTROSE 50 % IV SOLN
INTRAVENOUS | Status: AC
  Administered 2018-04-30: 30 mL
  Filled 2018-04-30: qty 50

## 2018-04-30 MED ORDER — CLONIDINE HCL 0.3 MG PO TABS
0.3000 mg | ORAL_TABLET | Freq: Two times a day (BID) | ORAL | Status: DC
  Administered 2018-04-30 (×2): 0.3 mg
  Filled 2018-04-30 (×3): qty 1

## 2018-04-30 MED ORDER — DEXTROSE 50 % IV SOLN
INTRAVENOUS | Status: AC
  Administered 2018-04-30: 20 mL
  Filled 2018-04-30: qty 50

## 2018-04-30 MED ORDER — AMLODIPINE 1 MG/ML ORAL SUSPENSION
10.0000 mg | Freq: Every day | ORAL | Status: DC
  Administered 2018-04-30: 10 mg
  Filled 2018-04-30 (×3): qty 10

## 2018-04-30 NOTE — Progress Notes (Signed)
Inpatient Diabetes Program Recommendations  AACE/ADA: New Consensus Statement on Inpatient Glycemic Control (2015)  Target Ranges:  Prepandial:   less than 140 mg/dL      Peak postprandial:   less than 180 mg/dL (1-2 hours)      Critically ill patients:  140 - 180 mg/dL   Lab Results  Component Value Date   GLUCAP 159 (H) 04/30/2018   HGBA1C 6.3 (H) 02/13/2018    Review of Glycemic Control Results for Jared [name], Jared [name] (MRN [id number]) as of 04/30/2018 08:38  Ref. Range 04/30/2018 04:43 04/30/2018 05:36 04/30/2018 06:37 04/30/2018 07:22  Glucose-Capillary Latest Ref Range: 65 - 99 mg/dL 222 (H) 203 (H) 158 (H) 159 (H)   Diabetes history: Type 2 DM Outpatient Diabetes medications: NPH 20 units QAM Current orders for Inpatient glycemic control: IV insulin  Inpatient Diabetes Program Recommendations:    When ready to transition following cleared acidosis, per MD, consider placing orders for the ICU order set phase 2 and transition to phase 3 with Levemir administration 2 hours prior to discontinuation of the drip.   Patient of Dr Cordelia Pen, last seen in 03/2017. A1C was down to 6.3 on 02/13/2018 which is down from 8.5 when he saw Loanne Drilling. Will plan to follow when appropriate.   Thanks, Bronson Curb, MSN, RNC-OB Diabetes Coordinator [phone] (8a-5p)

## 2018-04-30 NOTE — Progress Notes (Signed)
PULMONARY / CRITICAL CARE MEDICINE   Name: Jared [name] MRN: [id number] DOB: 1956-02-04    ADMISSION DATE:  04/29/2018 CONSULTATION DATE:  04/29/2018  REFERRING MD:  Dr. Roxanne Mins   CHIEF COMPLAINT:  Hyperglycemia/Seziures   HISTORY OF PRESENT ILLNESS:   62 year old male with PMH of Anemia, ESRD on PD, DM, GERD, HTN, CVA, Hypothyroidism, Seizures   Presents to ED on 6/3 with leftward gaze deviation, right-side jerking, aphasia, and AMS. Wife states he was in usual state of health. Out all day sightseeing, skipped PD. Early this AM found to be restless and grunting. Per EMS patient noted to be 2 seizures on route to ED, given total 5 mg versed. Code Stroke Called. CT Head Negative. Glucose 912. WBC 4.5. Neurology consulted. Suspected seizure due to hyperglycemia. PCCM consulted.   Upon evaluation in ED patient not protecting airway, intubated by PCCM.     SUBJECTIVE:  Final EEG report without any evidence of residual seizures MRI performed 6/3 as below, no evidence of acute stroke or explanation for seizure activity. Nicardipine has been weaned to off Insulin drip has been weaned significantly as well, still running. D5 0.45NS held Peritoneal dialysate was removed this morning  VITAL SIGNS: BP (!) 135/91   Pulse 93   Temp 99.5 F (37.5 C)   Resp 15   Ht 6\' 1"  (1.854 m)   Wt 78.2 kg (172 lb 6.4 oz)   SpO2 100%   BMI 22.75 kg/m   HEMODYNAMICS:    VENTILATOR SETTINGS: Vent Mode: PRVC FiO2 (%):  [40 %] 40 % Set Rate:  [15 bmp] 15 bmp Vt Set:  [640 mL] 640 mL PEEP:  [5 cmH20] 5 cmH20 Plateau Pressure:  [17 cmH20-21 cmH20] 17 cmH20  INTAKE / OUTPUT: I/O last 3 completed shifts: In: 3467.9 [I.V.:3287.9; IV Piggyback:180] Out: 15 [Urine:15]  PHYSICAL EXAMINATION: General: Ill-appearing, sedated on propofol Neuro: Some grimace and turn head to stimulation, does not follow commands, positive cough HEENT: Endotracheal tube in place, oropharynx dry Cardiovascular: Regular, no  murmur, tachycardia is improved Lungs: Coarse bilateral breath sounds Abdomen: Mild distention, hypoactive bowel sounds, peritoneal catheter in place Musculoskeletal: Trace pretibial edema Skin: Left upper extremity AV fistula, good thrill  LABS:  BMET Recent Labs  Lab 04/29/18 1900 04/30/18 0047 04/30/18 0451  NA 133* 133* 134*  K 4.7 3.9 3.8  CL 96* 96* 96*  CO2 22 21* 21*  BUN 63* 60* 57*  CREATININE 10.23* 9.93* 9.61*  GLUCOSE 69 227* 223*    Electrolytes Recent Labs  Lab 04/29/18 0412  04/29/18 1900 04/30/18 0047 04/30/18 0451  CALCIUM  --    < > 8.9 8.7* 8.7*  MG 1.6*  --   --   --  1.4*  PHOS  --   --   --   --  3.0   < > = values in this interval not displayed.    CBC Recent Labs  Lab 04/29/18 0339 04/30/18 0451  WBC 4.5 7.5  HGB 11.7* 10.3*  HCT 36.5* 30.2*  PLT 160 149*    Coag's Recent Labs  Lab 04/29/18 0339  APTT 26  INR 0.98    Sepsis Markers Recent Labs  Lab 04/29/18 0837 04/29/18 1137 04/29/18 2154  LATICACIDVEN 5.2* 5.8* 3.3*  PROCALCITON 1.12  --   --     ABG Recent Labs  Lab 04/29/18 0603 04/29/18 0731  PHART 7.344* 7.365  PCO2ART 47.5 39.5  PO2ART 307.0* 134.0*    Liver Enzymes Recent Labs  Lab 04/29/18 0339  AST 28  ALT 19  ALKPHOS 60  BILITOT 0.9  ALBUMIN 3.2*    Cardiac Enzymes Recent Labs  Lab 04/29/18 0837 04/29/18 1358 04/29/18 1900  TROPONINI <0.03 0.03* 0.04*    Glucose Recent Labs  Lab 04/30/18 0229 04/30/18 0443 04/30/18 0536 04/30/18 0637 04/30/18 0722 04/30/18 0905  GLUCAP 218* 222* 203* 158* 159* 92    Imaging Mr Brain Wo Contrast  Result Date: 04/29/2018 CLINICAL DATA:  New onset seizure EXAM: MRI HEAD WITHOUT CONTRAST TECHNIQUE: Multiplanar, multiecho pulse sequences of the brain and surrounding structures were obtained without intravenous contrast. COMPARISON:  Head CT 04/29/2018 FINDINGS: BRAIN: The midline structures are normal. There is no acute infarct or acute  hemorrhage. There is no mass lesion or other mass effect. There is no hydrocephalus, dural abnormality or extra-axial collection. The white matter signal is normal for the patient's age. Generalized atrophy without lobar predilection. No chronic microhemorrhage or superficial siderosis. VASCULAR: Major intracranial arterial and venous sinus flow voids are preserved. SKULL AND UPPER CERVICAL SPINE: The visualized skull base, calvarium, upper cervical spine and extracranial soft tissues are normal. SINUSES/ORBITS: No fluid levels or advanced mucosal thickening. No mastoid or middle ear effusion. The orbits are normal. IMPRESSION: Generalized volume loss without lobar predilection. No acute abnormality or focal finding to suggest seizure etiology. Electronically Signed   By: Ulyses Jarred M.D.   On: 04/29/2018 17:26   Dg Chest Port 1 View  Result Date: 04/30/2018 CLINICAL DATA:  Acute respiratory failure. EXAM: PORTABLE CHEST 1 VIEW COMPARISON:  04/29/2018. FINDINGS: Low lung volumes. LEFT ventricular hypertrophy. Perihilar opacities appear similar without significant improvement. ET tube 2 cm above carina, should be pulled back approximately 2 cm. Nasogastric tube in the stomach. IMPRESSION: Stable perihilar densities.  ETT too low, 2 cm above. Electronically Signed   By: Staci Righter M.D.   On: 04/30/2018 07:24     STUDIES:  CT Head 6/03 > 1. No acute intracranial process on this motion degraded examination. 2. Mild parenchymal brain volume loss for age. CXR 6/03 >   CULTURES: Blood 6/3 >> Tracheal Asp 6/3 >> U/A 6/3 >> Peritoneal fluid 6/3 >>   ANTIBIOTICS: Aztreonam 6/3  >> 6/3 cefepime 6/3 >>  Vanco 6/3 >>   SIGNIFICANT EVENTS: 6/3 > Presents to ED   LINES/TUBES: ETT 6/3 >>  DISCUSSION: 62 year old male presents post-ictal and hyperglycemic requiring intubation for airway protection    ASSESSMENT / PLAN:  Respiratory Insufficieny in setting of encephalopathy  Possible  aspiration pneumonia P:   Little change perihilar densities, no secretions, low to moderate suspicion for pneumonia.  Antibiotics as below Continue current been letter support, transition to pressure support when we lighten his sedation.  Suspect that he will be an extubation candidate on 6/4 his principal reason for intubation was airway protection, encephalopathy. VAP prevention orders   HTN  P:  Nicardipine has been weaned to off Add back home metoprolol, clonidine, amlodipine now. Add back the remainder of his antihypertensive regimen in the coming days as blood pressure dictates Goal systolic now < 323  ESRD on PD.  Per Dr. Deterding's notes may possibly be transitioned over to intermittent HD Hypomagnesemia   Acute anion gap metabolic acidosis, lactic acidosis; improved P:   Appreciate nephrology assistance Peritoneal dialysate removed 6/4 a.m., may be transitioning over to intermittent HD Continue to follow BMP Want to minimize IVF if possible, but he will need at least low dose D5 1/2 NS  until AG closes and we can get him off insulin gtt  GERD P:   Tube feeding in the next 24 hours if he is not an extubation candidate today Continue PPI as ordered   Anemia of Chronic Disease  Thrombocytopenia - on previous admission 153, stable P:  Follow CBC, no current evidence of any bleeding Heparin for DVT prophylaxis Transfusion goal Hgb > 7  Aspiration Event, possible aspiration pneumonia Severe sepsis, occult septic shock (elevated lactate); at risk for peritonitis given his indwelling catheter, peritoneal dialysis P:   Continue vancomycin, cefepime pending culture data   Severe hyperglycemia, HONC P:   Acidosis improved, CBG improved. Continue insulin drip for now, anion gap remains elevated.  Some of this likely reflects his renal failure as well.  If he does get intermittent HD then suspect we will be able to come off the insulin drip quickly. Following frequent  BMP   Encephalopathy in setting of post-ictal state  Seizure in setting of hyperglycemia  H/O Seizures   P:   RASS goal: 0 to -1 No clear evidence for continued seizures, no evidence for a reciprocating cause on MRI brain.  Suspect that this is all toxic metabolic in the setting of hyperglycemia, hypertension. Wean propofol, goal to off to allow spontaneous breathing. Continue Keppra as ordered  FAMILY  - Updates: Dr. Lamonte Sakai discussed the case with the patient's sister at bedside 6/3  - Inter-disciplinary family meet or Palliative Care meeting due by: 05/06/2018  Independent CC time 27 minutes  Baltazar Apo, MD, PhD 04/30/2018, 9:49 AM [facility] [phone] or if no answer [phone]

## 2018-04-30 NOTE — Progress Notes (Signed)
Dr. Oletta Darter notified about pt's [id number],[phone],  0451 BMP and 2154 Lactic acid.

## 2018-04-30 NOTE — Progress Notes (Signed)
[facility] Progress Note Patient Name: Maveryk Renstrom DOB: August 14, 1956 MRN: [id number]   Date of Service  04/30/2018  HPI/Events of Note  Mg++ = 1.4 and Creatinine = 9.61.  eICU Interventions  Defer Mg++ replacement to Nephrology Service.      Intervention Category Major Interventions: Electrolyte abnormality - evaluation and management  Deuce Paternoster Eugene 04/30/2018, 6:05 AM

## 2018-04-30 NOTE — Progress Notes (Signed)
[facility] Progress Note Patient Name: Samantha Olivera DOB: 09/24/1956 MRN: [id number]   Date of Service  04/30/2018  HPI/Events of Note  Agitation - Request to renew bilateral soft wrist restraint orders.   eICU Interventions  Will renew bilateral soft wrist restraint orders.      Intervention Category Minor Interventions: Agitation / anxiety - evaluation and management  Sommer,Steven Eugene 04/30/2018, 5:52 AM

## 2018-04-30 NOTE — Procedures (Signed)
Central Venous Catheter Insertion Procedure Note Jared [name] [id number] 1956-01-19  Procedure: Insertion of Central Venous Catheter Indications: Drug and/or fluid administration, Frequent blood sampling and no peripheral access  Pre-sedated with fentanyl 100 mcg IM.  Procedure Details Consent: Risks of procedure as well as the alternatives and risks of each were explained to the (patient/caregiver).  Consent for procedure obtained. Time Out: Verified patient identification, verified procedure, site/side was marked, verified correct patient position, special equipment/implants available, medications/allergies/relevent history reviewed, required imaging and test results available.  Performed  Maximum sterile technique was used including antiseptics, cap, gloves, gown, hand hygiene, mask and sheet. Skin prep: Chlorhexidine; local anesthetic administered 4 ml.  A antimicrobial bonded/coated triple lumen catheter was placed in the right internal jugular vein using the Seldinger technique to 18 cm.  Line sutured.  Biopatch placed and sterile dressing applied.   Evaluation Blood flow good Complications: No apparent complications Patient did tolerate procedure well. Chest X-ray ordered to verify placement.  CXR: pending.  Procedure performed with ultrasound guidance for real time vessel cannulation.     Kennieth Rad, AGACNP-BC [facility] Pgr: [phone] or if no answer [phone] 04/30/2018, 5:47 PM

## 2018-04-30 NOTE — Progress Notes (Signed)
Subjective: Interval History: hemodynamics stable, not responsive , on vent.  .  Objective: Vital signs in last 24 hours: Temp:  [97.7 F (36.5 C)-99.1 F (37.3 C)] 98.6 F (37 C) (06/04 0615) Pulse Rate:  [79-105] 86 (06/04 0615) Resp:  [15-23] 15 (06/04 0615) BP: (105-226)/(48-114) 105/70 (06/04 0600) SpO2:  [98 %-100 %] 100 % (06/04 0615) FiO2 (%):  [40 %-50 %] 40 % (06/04 0403) Weight:  [81.7 kg (180 lb 1.9 oz)] 81.7 kg (180 lb 1.9 oz) (06/03 2000) Weight change: 6.7 kg (14 lb 12.3 oz)  Intake/Output from previous day: 06/03 0701 - 06/04 0700 In: 2180.5 [I.V.:2180.5] Out: 15 [Urine:15] Intake/Output this shift: No intake/output data recorded.  General appearance: on vent, not responsive,  Resp: rales bibasilar and rhonchi bibasilar Cardio: regularly irregular rhythm and systolic murmur: systolic ejection 2/6, decrescendo at 2nd left intercostal space GI: pos bs, pos FW.   Extremities: AVF LUA, 1 + edema.  Lab Results: Recent Labs    04/29/18 0339 04/30/18 0451  WBC 4.5 7.5  HGB 11.7* 10.3*  HCT 36.5* 30.2*  PLT 160 149*   BMET:  Recent Labs    04/30/18 0047 04/30/18 0451  NA 133* 134*  K 3.9 3.8  CL 96* 96*  CO2 21* 21*  GLUCOSE 227* 223*  BUN 60* 57*  CREATININE 9.93* 9.61*  CALCIUM 8.7* 8.7*   No results for input(s): PTH in the last 72 hours. Iron Studies: No results for input(s): IRON, TIBC, TRANSFERRIN, FERRITIN in the last 72 hours.  Studies/Results: Mr Brain Wo Contrast  Result Date: 04/29/2018 CLINICAL DATA:  New onset seizure EXAM: MRI HEAD WITHOUT CONTRAST TECHNIQUE: Multiplanar, multiecho pulse sequences of the brain and surrounding structures were obtained without intravenous contrast. COMPARISON:  Head CT 04/29/2018 FINDINGS: BRAIN: The midline structures are normal. There is no acute infarct or acute hemorrhage. There is no mass lesion or other mass effect. There is no hydrocephalus, dural abnormality or extra-axial collection. The white  matter signal is normal for the patient's age. Generalized atrophy without lobar predilection. No chronic microhemorrhage or superficial siderosis. VASCULAR: Major intracranial arterial and venous sinus flow voids are preserved. SKULL AND UPPER CERVICAL SPINE: The visualized skull base, calvarium, upper cervical spine and extracranial soft tissues are normal. SINUSES/ORBITS: No fluid levels or advanced mucosal thickening. No mastoid or middle ear effusion. The orbits are normal. IMPRESSION: Generalized volume loss without lobar predilection. No acute abnormality or focal finding to suggest seizure etiology. Electronically Signed   By: Ulyses Jarred M.D.   On: 04/29/2018 17:26   Dg Chest Port 1 View  Result Date: 04/29/2018 CLINICAL DATA:  Endotracheal tube and orogastric tube placement. EXAM: PORTABLE CHEST 1 VIEW COMPARISON:  Chest radiograph performed 11/24/2017 FINDINGS: The patient's endotracheal tube is seen ending 3 cm above the carina. An enteric tube is noted overlying the body of the stomach. The lungs are well-aerated. Right perihilar airspace opacification may reflect pneumonia. There is no evidence of pleural effusion or pneumothorax. The cardiomediastinal silhouette is within normal limits. No acute osseous abnormalities are seen. IMPRESSION: 1. Endotracheal tube seen ending 3 cm above the carina. 2. Enteric tube noted overlying the body of the stomach. 3. Right perihilar airspace opacification may reflect pneumonia. Followup PA and lateral chest X-ray is recommended in 3-4 weeks following trial of antibiotic therapy to ensure resolution and exclude underlying malignancy. Electronically Signed   By: Garald Balding M.D.   On: 04/29/2018 06:29   Ct Head Code Stroke Wo Contrast  Result Date: 04/29/2018 CLINICAL DATA:  Code stroke. LEFT facial droop, seizure. Follow-up code stroke. Last seen normal at 2100 hours. EXAM: CT HEAD WITHOUT CONTRAST TECHNIQUE: Contiguous axial images were obtained from the  base of the skull through the vertex without intravenous contrast. COMPARISON:  MRI of the head November 27, 2017 FINDINGS: Moderately motion degraded examination. BRAIN: No intraparenchymal hemorrhage, mass effect nor midline shift. Mild parenchymal brain volume loss for age. No hydrocephalus. No acute large vascular territory infarcts. No abnormal extra-axial fluid collections. Basal cisterns are patent. VASCULAR: Mild-to-moderate calcific atherosclerosis of the carotid siphons. SKULL: No skull fracture. Focal LEFT maxillary fibrous dysplasia. No significant scalp soft tissue swelling. LEFT suboccipital scalp lipoma. Scalp calcifications. SINUSES/ORBITS: The mastoid air-cells and included paranasal sinuses are well-aerated.The included ocular globes and orbital contents are non-suspicious. OTHER: Patient is edentulous. ASPECTS [facility] Stroke Program Early CT Score) - Ganglionic level infarction (caudate, lentiform nuclei, internal capsule, insula, M1-M3 cortex): 7 - Supraganglionic infarction (M4-M6 cortex): 3 Total score (0-10 with 10 being normal): 10 IMPRESSION: 1. No acute intracranial process on this motion degraded examination. 2. Mild parenchymal brain volume loss for age. 3. ASPECTS is 10. 4. Critical Value/emergent results text paged to [place], Neurology via [facility] system on 04/29/2018 at 3:56 am, including interpreting physician's phone number. Electronically Signed   By: Elon Alas M.D.   On: 04/29/2018 03:58    I have reviewed the patient's current medications.  Assessment/Plan: 1 ESRD PD going well, lowering solute,vol. Will change to IHD per plan due to outpatient issues. 2 VDRF per CCM 3 ^Lactate most likely sz 4 DM controlled 5 HTN better with lower vol 6 NONADHERENCE recurrent issue 7 SZ per Neuro 8 Prostate Ca P stop PD, start esa, check Fe, PTH, IHD, control    LOS: 1 day   Jeneen Rinks Lynnleigh Soden 04/30/2018,7:04 AM

## 2018-04-30 NOTE — Progress Notes (Signed)
Subjective: Currently undergoing peritoneal dialysis. Sedated on propofol at rate of 50.   Objective: Current vital signs: BP 129/78   Pulse 85   Temp 98.6 F (37 C)   Resp 15   Ht '6\' 1"'$  (1.854 m)   Wt 81.7 kg (180 lb 1.9 oz)   SpO2 100%   BMI 23.76 kg/m  Vital signs in last 24 hours: Temp:  [97.7 F (36.5 C)-99.1 F (37.3 C)] 98.6 F (37 C) (06/04 0630) Pulse Rate:  [79-105] 85 (06/04 0630) Resp:  [15-23] 15 (06/04 0630) BP: (105-226)/(48-114) 129/78 (06/04 0630) SpO2:  [98 %-100 %] 100 % (06/04 0630) FiO2 (%):  [40 %-50 %] 40 % (06/04 0403) Weight:  [81.7 kg (180 lb 1.9 oz)] 81.7 kg (180 lb 1.9 oz) (06/03 2000)  Intake/Output from previous day: 06/03 0701 - 06/04 0700 In: 2180.5 [I.V.:2180.5] Out: 15 [Urine:15] Intake/Output this shift: No intake/output data recorded. Nutritional status:  Diet Order           Diet NPO time specified  Diet effective now          Neurologic Exam: Ment: Sedated on propofol. Does not respond to verbal stimuli.  CN: Weak corneal reflexes bilaterally. Sluggishly reactive symmetric pupils. No doll's eye reflex.  Motor/Sensory: Minimal withdrawal to noxious bilateral upper extremities. Flexes ankles in response to plantar stimulation.    Lab Results: Results for orders placed or performed during the hospital encounter of 04/29/18 (from the past 48 hour(s))  CBG monitoring, ED     Status: Abnormal   Collection Time: 04/29/18  3:37 AM  Result Value Ref Range   Glucose-Capillary >600 (HH) 65 - 99 mg/dL  Ethanol     Status: None   Collection Time: 04/29/18  3:39 AM  Result Value Ref Range   Alcohol, Ethyl (B) <10 <10 mg/dL    Comment: (NOTE) Lowest detectable limit for serum alcohol is 10 mg/dL. For medical purposes only. Performed at [facility], [place] [address] [place], [place] [address]   Protime-INR     Status: None   Collection Time: 04/29/18  3:39 AM  Result Value Ref Range   Prothrombin Time 12.9 11.4 - 15.2  seconds   INR 0.98     Comment: Performed at [facility] [address], [place], [place] [address]  APTT     Status: None   Collection Time: 04/29/18  3:39 AM  Result Value Ref Range   aPTT 26 24 - 36 seconds    Comment: Performed at [facility] [address], [place], [place] [address]  CBC     Status: Abnormal   Collection Time: 04/29/18  3:39 AM  Result Value Ref Range   WBC 4.5 4.0 - 10.5 K/uL   RBC 4.38 4.22 - 5.81 MIL/uL   Hemoglobin 11.7 (L) 13.0 - 17.0 g/dL   HCT 36.5 (L) 39.0 - 52.0 %   MCV 83.3 78.0 - 100.0 fL   MCH 26.7 26.0 - 34.0 pg   MCHC 32.1 30.0 - 36.0 g/dL   RDW 14.1 11.5 - 15.5 %   Platelets 160 150 - 400 K/uL    Comment: Performed at [facility], [place] [address], [place], [place] [address]  Differential     Status: None   Collection Time: 04/29/18  3:39 AM  Result Value Ref Range   Neutrophils Relative % 48 %   Neutro Abs 2.2 1.7 - 7.7 K/uL   Lymphocytes Relative 34 %  Lymphs Abs 1.5 0.7 - 4.0 K/uL   Monocytes Relative 11 %   Monocytes Absolute 0.5 0.1 - 1.0 K/uL   Eosinophils Relative 6 %   Eosinophils Absolute 0.3 0.0 - 0.7 K/uL   Basophils Relative 1 %   Basophils Absolute 0.0 0.0 - 0.1 K/uL   Immature Granulocytes 0 %   Abs Immature Granulocytes 0.0 0.0 - 0.1 K/uL    Comment: Performed at [facility], [place] [address], [place], [place] [address]  Comprehensive metabolic panel     Status: Abnormal   Collection Time: 04/29/18  3:39 AM  Result Value Ref Range   Sodium 125 (L) 135 - 145 mmol/L   Potassium 4.6 3.5 - 5.1 mmol/L   Chloride 85 (L) 101 - 111 mmol/L   CO2 22 22 - 32 mmol/L   Glucose, Bld 912 (HH) 65 - 99 mg/dL    Comment: CRITICAL RESULT CALLED TO, READ BACK BY AND VERIFIED WITH: Donata Clay [id number] WILDERK    BUN 56 (H) 6 - 20 mg/dL   Creatinine, Ser 10.06 (H) 0.61 - 1.24 mg/dL   Calcium 8.9 8.9 - 10.3 mg/dL   Total Protein 6.4 (L) 6.5 - 8.1 g/dL   Albumin 3.2 (L) 3.5 - 5.0 g/dL   AST 28 15 - 41  U/L   ALT 19 17 - 63 U/L   Alkaline Phosphatase 60 38 - 126 U/L   Total Bilirubin 0.9 0.3 - 1.2 mg/dL   GFR calc non Af Amer 5 (L) >60 mL/min   GFR calc Af Amer 6 (L) >60 mL/min    Comment: (NOTE) The eGFR has been calculated using the CKD EPI equation. This calculation has not been validated in all clinical situations. eGFR's persistently <60 mL/min signify possible Chronic Kidney Disease.    Anion gap 18 (H) 5 - 15    Comment: Performed at [facility], [place] [address] [place], [place] [address]  I-stat troponin, ED     Status: None   Collection Time: 04/29/18  3:40 AM  Result Value Ref Range   Troponin i, poc 0.01 0.00 - 0.08 ng/mL   Comment 3            Comment: Due to the release kinetics of cTnI, a negative result within the first hours of the onset of symptoms does not rule out myocardial infarction with certainty. If myocardial infarction is still suspected, repeat the test at appropriate intervals.   CBG monitoring, ED     Status: Abnormal   Collection Time: 04/29/18  3:54 AM  Result Value Ref Range   Glucose-Capillary >600 (HH) 65 - 99 mg/dL  Magnesium     Status: Abnormal   Collection Time: 04/29/18  4:12 AM  Result Value Ref Range   Magnesium 1.6 (L) 1.7 - 2.4 mg/dL    Comment: Performed at [facility] [address] [place],  [address]  CBG monitoring, ED     Status: Abnormal   Collection Time: 04/29/18  5:39 AM  Result Value Ref Range   Glucose-Capillary >600 (HH) 65 - 99 mg/dL  I-Stat arterial blood gas, ED     Status: Abnormal   Collection Time: 04/29/18  6:03 AM  Result Value Ref Range   pH, Arterial 7.344 (L) 7.350 - 7.450   pCO2 arterial 47.5 32.0 - 48.0 mmHg   pO2, Arterial 307.0 (H) 83.0 - 108.0 mmHg   Bicarbonate 26.1 20.0 - 28.0 mmol/L   TCO2 28  22 - 32 mmol/L   O2 Saturation 100.0 %   Patient temperature 97.1 F    Collection site RADIAL, ALLEN'S TEST ACCEPTABLE    Drawn by RT    Sample type ARTERIAL   Urinalysis,  Routine w reflex microscopic     Status: Abnormal   Collection Time: 04/29/18  6:42 AM  Result Value Ref Range   Color, Urine STRAW (A) YELLOW   APPearance CLEAR CLEAR   Specific Gravity, Urine 1.010 1.005 - 1.030   pH 9.0 (H) 5.0 - 8.0   Glucose, UA >=500 (A) NEGATIVE mg/dL   Hgb urine dipstick MODERATE (A) NEGATIVE   Bilirubin Urine NEGATIVE NEGATIVE   Ketones, ur 5 (A) NEGATIVE mg/dL   Protein, ur 100 (A) NEGATIVE mg/dL   Nitrite NEGATIVE NEGATIVE   Leukocytes, UA NEGATIVE NEGATIVE   RBC / HPF 21-50 0 - 5 RBC/hpf   WBC, UA 0-5 0 - 5 WBC/hpf   Bacteria, UA NONE SEEN NONE SEEN    Comment: Performed at [facility], [address], [place], [place] [address]  Urine rapid drug screen (hosp performed)     Status: None   Collection Time: 04/29/18  6:42 AM  Result Value Ref Range   Opiates NONE DETECTED NONE DETECTED   Cocaine NONE DETECTED NONE DETECTED   Benzodiazepines NONE DETECTED NONE DETECTED   Amphetamines NONE DETECTED NONE DETECTED   Tetrahydrocannabinol NONE DETECTED NONE DETECTED   Barbiturates NONE DETECTED NONE DETECTED    Comment: (NOTE) DRUG SCREEN FOR MEDICAL PURPOSES ONLY.  IF CONFIRMATION IS NEEDED FOR ANY PURPOSE, NOTIFY LAB WITHIN 5 DAYS. LOWEST DETECTABLE LIMITS FOR URINE DRUG SCREEN Drug Class                     Cutoff (ng/mL) Amphetamine and metabolites    1000 Barbiturate and metabolites    200 Benzodiazepine                 175 Tricyclics and metabolites     300 Opiates and metabolites        300 Cocaine and metabolites        300 THC                            50 Performed at [facility], [place] [address] [place], [place] [address]   CBG monitoring, ED     Status: Abnormal   Collection Time: 04/29/18  6:43 AM  Result Value Ref Range   Glucose-Capillary >600 (HH) 65 - 99 mg/dL  Beta-hydroxybutyric acid     Status: Abnormal   Collection Time: 04/29/18  6:49 AM  Result Value Ref Range   Beta-Hydroxybutyric Acid 2.11 (H) 0.05 - 0.27  mmol/L    Comment: Performed at [facility] [address], [place],  [address]  I-Stat arterial blood gas, ED     Status: Abnormal   Collection Time: 04/29/18  7:31 AM  Result Value Ref Range   pH, Arterial 7.365 7.350 - 7.450   pCO2 arterial 39.5 32.0 - 48.0 mmHg   pO2, Arterial 134.0 (H) 83.0 - 108.0 mmHg   Bicarbonate 22.7 20.0 - 28.0 mmol/L   TCO2 24 22 - 32 mmol/L   O2 Saturation 99.0 %   Acid-base deficit 3.0 (H) 0.0 - 2.0 mmol/L   Patient temperature 36.6 C    Collection site RADIAL, ALLEN'S TEST ACCEPTABLE    Drawn by  RT    Sample type ARTERIAL   CBG monitoring, ED     Status: Abnormal   Collection Time: 04/29/18  7:50 AM  Result Value Ref Range   Glucose-Capillary >600 (HH) 65 - 99 mg/dL  Glucose, capillary     Status: Abnormal   Collection Time: 04/29/18  8:23 AM  Result Value Ref Range   Glucose-Capillary >600 (HH) 65 - 99 mg/dL   Comment 1 Capillary Specimen    Comment 2 Notify RN   Basic metabolic panel     Status: Abnormal   Collection Time: 04/29/18  8:37 AM  Result Value Ref Range   Sodium 128 (L) 135 - 145 mmol/L   Potassium 4.2 3.5 - 5.1 mmol/L   Chloride 88 (L) 101 - 111 mmol/L   CO2 21 (L) 22 - 32 mmol/L   Glucose, Bld 709 (HH) 65 - 99 mg/dL    Comment: CRITICAL RESULT CALLED TO, READ BACK BY AND VERIFIED WITH: Jacklynn Barnacle RN 1012 04/29/2018 BY A BENNETT    BUN 59 (H) 6 - 20 mg/dL   Creatinine, Ser 10.14 (H) 0.61 - 1.24 mg/dL   Calcium 8.9 8.9 - 10.3 mg/dL   GFR calc non Af Amer 5 (L) >60 mL/min   GFR calc Af Amer 5 (L) >60 mL/min    Comment: (NOTE) The eGFR has been calculated using the CKD EPI equation. This calculation has not been validated in all clinical situations. eGFR's persistently <60 mL/min signify possible Chronic Kidney Disease.    Anion gap 19 (H) 5 - 15    Comment: Performed at [facility], [place] [address], [place], [place] [address]  Troponin I (q 6hr x 3)     Status: None   Collection Time: 04/29/18  8:37 AM   Result Value Ref Range   Troponin I <0.03 <0.03 ng/mL    Comment: Performed at [facility] [address] [place], [place] [address]  Lactic acid, plasma     Status: Abnormal   Collection Time: 04/29/18  8:37 AM  Result Value Ref Range   Lactic Acid, Venous 5.2 (HH) 0.5 - 1.9 mmol/L    Comment: CRITICAL RESULT CALLED TO, READ BACK BY AND VERIFIED WITH: D WHITE RN [phone] 04/29/2018 BY A BENNETT Performed at [facility], [place] [address], [place], [place] [address] CORRECTED ON 06/03 AT 0954: PREVIOUSLY REPORTED AS 5.2 CRITICAL RESULT CALLED TO, READ BACK BY AND VERIFIED WITH: Maud Deed RN [id number] 04/29/2018 BY A BENNETT   Procalcitonin - Baseline     Status: None   Collection Time: 04/29/18  8:37 AM  Result Value Ref Range   Procalcitonin 1.12 ng/mL    Comment:        Interpretation: PCT > 0.5 ng/mL and <= 2 ng/mL: Systemic infection (sepsis) is possible, but other conditions are known to elevate PCT as well. (NOTE)       Sepsis PCT Algorithm           Lower Respiratory Tract                                      Infection PCT Algorithm    ----------------------------     ----------------------------         PCT < 0.25 ng/mL                PCT < 0.10 ng/mL  Strongly encourage             Strongly discourage   discontinuation of antibiotics    initiation of antibiotics    ----------------------------     -----------------------------       PCT 0.25 - 0.50 ng/mL            PCT 0.10 - 0.25 ng/mL               OR       >80% decrease in PCT            Discourage initiation of                                            antibiotics      Encourage discontinuation           of antibiotics    ----------------------------     -----------------------------         PCT >= 0.50 ng/mL              PCT 0.26 - 0.50 ng/mL                AND       <80% decrease in PCT             Encourage initiation of                                             antibiotics       Encourage  continuation           of antibiotics    ----------------------------     -----------------------------        PCT >= 0.50 ng/mL                  PCT > 0.50 ng/mL               AND         increase in PCT                  Strongly encourage                                      initiation of antibiotics    Strongly encourage escalation           of antibiotics                                     -----------------------------                                           PCT <= 0.25 ng/mL                                                 OR                                        >  80% decrease in PCT                                     Discontinue / Do not initiate                                             antibiotics Performed at [facility], [place] [address], [place], [place] [address]   HIV antibody (Routine Testing)     Status: None   Collection Time: 04/29/18  8:37 AM  Result Value Ref Range   HIV Screen 4th Generation wRfx Non Reactive Non Reactive    Comment: (NOTE) Performed At: [facility] [address] [place], [place] [id number] Rush Farmer MD [id number] Performed at [facility], [place] [address], [place], [place] [address]   Triglycerides     Status: None   Collection Time: 04/29/18  8:37 AM  Result Value Ref Range   Triglycerides 115 <150 mg/dL    Comment: Performed at [facility] [address], [place],  [address]  MRSA PCR Screening     Status: None   Collection Time: 04/29/18  8:54 AM  Result Value Ref Range   MRSA by PCR NEGATIVE NEGATIVE    Comment:        The GeneXpert MRSA Assay (FDA approved for NASAL specimens only), is one component of a comprehensive MRSA colonization surveillance program. It is not intended to diagnose MRSA infection nor to guide or monitor treatment for MRSA infections. Performed at [facility], [place] [address] [place], [place] [address]   Glucose, capillary     Status: Abnormal    Collection Time: 04/29/18  9:52 AM  Result Value Ref Range   Glucose-Capillary 547 (HH) 65 - 99 mg/dL   Comment 1 Capillary Specimen   Glucose, capillary     Status: Abnormal   Collection Time: 04/29/18 10:47 AM  Result Value Ref Range   Glucose-Capillary 459 (H) 65 - 99 mg/dL   Comment 1 Capillary Specimen   Lactic acid, plasma     Status: Abnormal   Collection Time: 04/29/18 11:37 AM  Result Value Ref Range   Lactic Acid, Venous 5.8 (HH) 0.5 - 1.9 mmol/L    Comment: CRITICAL RESULT CALLED TO, READ BACK BY AND VERIFIED WITH: M.DYKES,RN 1239 04/29/18 CLARK,S Performed at [facility], [place] [address], [place], [place] [address]   Glucose, capillary     Status: Abnormal   Collection Time: 04/29/18 11:51 AM  Result Value Ref Range   Glucose-Capillary 416 (H) 65 - 99 mg/dL  Glucose, capillary     Status: Abnormal   Collection Time: 04/29/18 12:57 PM  Result Value Ref Range   Glucose-Capillary 296 (H) 65 - 99 mg/dL  Basic metabolic panel     Status: Abnormal   Collection Time: 04/29/18  1:58 PM  Result Value Ref Range   Sodium 133 (L) 135 - 145 mmol/L   Potassium 3.7 3.5 - 5.1 mmol/L   Chloride 94 (L) 101 - 111 mmol/L   CO2 20 (L) 22 - 32 mmol/L   Glucose, Bld 273 (H) 65 - 99 mg/dL   BUN 60 (H) 6 - 20 mg/dL   Creatinine, Ser 10.06 (H) 0.61 - 1.24 mg/dL   Calcium  9.0 8.9 - 10.3 mg/dL   GFR calc non Af Amer 5 (L) >60 mL/min   GFR calc Af Amer 6 (L) >60 mL/min    Comment: (NOTE) The eGFR has been calculated using the CKD EPI equation. This calculation has not been validated in all clinical situations. eGFR's persistently <60 mL/min signify possible Chronic Kidney Disease.    Anion gap 19 (H) 5 - 15    Comment: Performed at [facility], [place] [address], [place], [place] [address]  Troponin I     Status: Abnormal   Collection Time: 04/29/18  1:58 PM  Result Value Ref Range   Troponin I 0.03 (HH) <0.03 ng/mL    Comment: CRITICAL RESULT CALLED TO, READ BACK BY AND  VERIFIED WITH: A MINOR,RN [id number] 04/29/2018 WBOND Performed at [facility], [place] [address], [place], [place] [address]   Glucose, capillary     Status: Abnormal   Collection Time: 04/29/18  2:02 PM  Result Value Ref Range   Glucose-Capillary 256 (H) 65 - 99 mg/dL  Glucose, capillary     Status: Abnormal   Collection Time: 04/29/18  2:58 PM  Result Value Ref Range   Glucose-Capillary 191 (H) 65 - 99 mg/dL   Comment 1 Capillary Specimen   Glucose, capillary     Status: Abnormal   Collection Time: 04/29/18  4:00 PM  Result Value Ref Range   Glucose-Capillary 151 (H) 65 - 99 mg/dL  Glucose, capillary     Status: None   Collection Time: 04/29/18  5:55 PM  Result Value Ref Range   Glucose-Capillary 76 65 - 99 mg/dL  Basic metabolic panel     Status: Abnormal   Collection Time: 04/29/18  7:00 PM  Result Value Ref Range   Sodium 133 (L) 135 - 145 mmol/L   Potassium 4.7 3.5 - 5.1 mmol/L   Chloride 96 (L) 101 - 111 mmol/L   CO2 22 22 - 32 mmol/L   Glucose, Bld 69 65 - 99 mg/dL   BUN 63 (H) 6 - 20 mg/dL   Creatinine, Ser 10.23 (H) 0.61 - 1.24 mg/dL   Calcium 8.9 8.9 - 10.3 mg/dL   GFR calc non Af Amer 5 (L) >60 mL/min   GFR calc Af Amer 5 (L) >60 mL/min    Comment: (NOTE) The eGFR has been calculated using the CKD EPI equation. This calculation has not been validated in all clinical situations. eGFR's persistently <60 mL/min signify possible Chronic Kidney Disease.    Anion gap 15 5 - 15    Comment: Performed at [facility] [address], [place], [place] [address]  Troponin I (q 6hr x 3)     Status: Abnormal   Collection Time: 04/29/18  7:00 PM  Result Value Ref Range   Troponin I 0.04 (HH) <0.03 ng/mL    Comment: CRITICAL VALUE NOTED.  VALUE IS CONSISTENT WITH PREVIOUSLY REPORTED AND CALLED VALUE. Performed at [facility], [place] [address] [place], [place] [address]   Glucose, capillary     Status: None   Collection Time: 04/29/18  7:19 PM  Result Value  Ref Range   Glucose-Capillary 78 65 - 99 mg/dL   Comment 1 Capillary Specimen   Glucose, capillary     Status: None   Collection Time: 04/29/18  7:27 PM  Result Value Ref Range   Glucose-Capillary 67 65 - 99 mg/dL   Comment 1 Capillary Specimen    Comment 2 Notify  RN   Glucose, capillary     Status: None   Collection Time: 04/29/18  8:20 PM  Result Value Ref Range   Glucose-Capillary 89 65 - 99 mg/dL   Comment 1 Notify RN   Glucose, capillary     Status: None   Collection Time: 04/29/18  9:20 PM  Result Value Ref Range   Glucose-Capillary 87 65 - 99 mg/dL   Comment 1 Notify RN   Lactic acid, plasma     Status: Abnormal   Collection Time: 04/29/18  9:54 PM  Result Value Ref Range   Lactic Acid, Venous 3.3 (HH) 0.5 - 1.9 mmol/L    Comment: CRITICAL RESULT CALLED TO, READ BACK BY AND VERIFIED WITH: FLYNT,F RN 04/29/2018 2245 JORDANS Performed at [facility], [place] [address] [place], [place] [address]   Glucose, capillary     Status: Abnormal   Collection Time: 04/29/18 10:34 PM  Result Value Ref Range   Glucose-Capillary 153 (H) 65 - 99 mg/dL   Comment 1 Notify RN   Glucose, capillary     Status: Abnormal   Collection Time: 04/29/18 11:28 PM  Result Value Ref Range   Glucose-Capillary 186 (H) 65 - 99 mg/dL   Comment 1 Notify RN   Glucose, capillary     Status: Abnormal   Collection Time: 04/30/18 12:33 AM  Result Value Ref Range   Glucose-Capillary 202 (H) 65 - 99 mg/dL   Comment 1 Notify RN   Basic metabolic panel     Status: Abnormal   Collection Time: 04/30/18 12:47 AM  Result Value Ref Range   Sodium 133 (L) 135 - 145 mmol/L   Potassium 3.9 3.5 - 5.1 mmol/L    Comment: DELTA CHECK NOTED   Chloride 96 (L) 101 - 111 mmol/L   CO2 21 (L) 22 - 32 mmol/L   Glucose, Bld 227 (H) 65 - 99 mg/dL   BUN 60 (H) 6 - 20 mg/dL   Creatinine, Ser 9.93 (H) 0.61 - 1.24 mg/dL   Calcium 8.7 (L) 8.9 - 10.3 mg/dL   GFR calc non Af Amer 5 (L) >60 mL/min   GFR calc Af Amer 6 (L)  >60 mL/min    Comment: (NOTE) The eGFR has been calculated using the CKD EPI equation. This calculation has not been validated in all clinical situations. eGFR's persistently <60 mL/min signify possible Chronic Kidney Disease.    Anion gap 16 (H) 5 - 15    Comment: Performed at [facility], [place] [address], [place], [place] [address]  Glucose, capillary     Status: Abnormal   Collection Time: 04/30/18  1:42 AM  Result Value Ref Range   Glucose-Capillary 212 (H) 65 - 99 mg/dL   Comment 1 Notify RN   Glucose, capillary     Status: Abnormal   Collection Time: 04/30/18  2:29 AM  Result Value Ref Range   Glucose-Capillary 218 (H) 65 - 99 mg/dL   Comment 1 Capillary Specimen    Comment 2 Notify RN   Glucose, capillary     Status: Abnormal   Collection Time: 04/30/18  4:43 AM  Result Value Ref Range   Glucose-Capillary 222 (H) 65 - 99 mg/dL   Comment 1 Notify RN   Basic metabolic panel     Status: Abnormal   Collection Time: 04/30/18  4:51 AM  Result Value Ref Range   Sodium 134 (L) 135 - 145 mmol/L   Potassium 3.8 3.5 - 5.1 mmol/L  Chloride 96 (L) 101 - 111 mmol/L   CO2 21 (L) 22 - 32 mmol/L   Glucose, Bld 223 (H) 65 - 99 mg/dL   BUN 57 (H) 6 - 20 mg/dL   Creatinine, Ser 9.61 (H) 0.61 - 1.24 mg/dL   Calcium 8.7 (L) 8.9 - 10.3 mg/dL   GFR calc non Af Amer 5 (L) >60 mL/min   GFR calc Af Amer 6 (L) >60 mL/min    Comment: (NOTE) The eGFR has been calculated using the CKD EPI equation. This calculation has not been validated in all clinical situations. eGFR's persistently <60 mL/min signify possible Chronic Kidney Disease.    Anion gap 17 (H) 5 - 15    Comment: Performed at [facility], [place] [address], [place], [place] [address]  CBC     Status: Abnormal   Collection Time: 04/30/18  4:51 AM  Result Value Ref Range   WBC 7.5 4.0 - 10.5 K/uL   RBC 3.86 (L) 4.22 - 5.81 MIL/uL   Hemoglobin 10.3 (L) 13.0 - 17.0 g/dL   HCT 30.2 (L) 39.0 - 52.0 %   MCV 78.2 78.0 -  100.0 fL   MCH 26.7 26.0 - 34.0 pg   MCHC 34.1 30.0 - 36.0 g/dL   RDW 14.5 11.5 - 15.5 %   Platelets 149 (L) 150 - 400 K/uL    Comment: Performed at [facility], [place] [address] [place], [place] [address]  Magnesium     Status: Abnormal   Collection Time: 04/30/18  4:51 AM  Result Value Ref Range   Magnesium 1.4 (L) 1.7 - 2.4 mg/dL    Comment: Performed at [facility] [address], [place], [place] [address]  Phosphorus     Status: None   Collection Time: 04/30/18  4:51 AM  Result Value Ref Range   Phosphorus 3.0 2.5 - 4.6 mg/dL    Comment: Performed at [facility] [address], [place], [place] [address]  Glucose, capillary     Status: Abnormal   Collection Time: 04/30/18  5:36 AM  Result Value Ref Range   Glucose-Capillary 203 (H) 65 - 99 mg/dL   Comment 1 Notify RN   Glucose, capillary     Status: Abnormal   Collection Time: 04/30/18  6:37 AM  Result Value Ref Range   Glucose-Capillary 158 (H) 65 - 99 mg/dL   Comment 1 Notify RN     Recent Results (from the past 240 hour(s))  MRSA PCR Screening     Status: None   Collection Time: 04/29/18  8:54 AM  Result Value Ref Range Status   MRSA by PCR NEGATIVE NEGATIVE Final    Comment:        The GeneXpert MRSA Assay (FDA approved for NASAL specimens only), is one component of a comprehensive MRSA colonization surveillance program. It is not intended to diagnose MRSA infection nor to guide or monitor treatment for MRSA infections. Performed at [facility], [place] [address] [place], [place] [address]     Lipid Panel Recent Labs    04/29/18 0837  TRIG 115    Studies/Results: Mr Brain Wo Contrast  Result Date: 04/29/2018 CLINICAL DATA:  New onset seizure EXAM: MRI HEAD WITHOUT CONTRAST TECHNIQUE: Multiplanar, multiecho pulse sequences of the brain and surrounding structures were obtained without intravenous contrast. COMPARISON:  Head CT 04/29/2018 FINDINGS: BRAIN: The midline structures  are normal. There is no acute infarct or acute hemorrhage. There  is no mass lesion or other mass effect. There is no hydrocephalus, dural abnormality or extra-axial collection. The white matter signal is normal for the patient's age. Generalized atrophy without lobar predilection. No chronic microhemorrhage or superficial siderosis. VASCULAR: Major intracranial arterial and venous sinus flow voids are preserved. SKULL AND UPPER CERVICAL SPINE: The visualized skull base, calvarium, upper cervical spine and extracranial soft tissues are normal. SINUSES/ORBITS: No fluid levels or advanced mucosal thickening. No mastoid or middle ear effusion. The orbits are normal. IMPRESSION: Generalized volume loss without lobar predilection. No acute abnormality or focal finding to suggest seizure etiology. Electronically Signed   By: Ulyses Jarred M.D.   On: 04/29/2018 17:26   Dg Chest Port 1 View  Result Date: 04/30/2018 CLINICAL DATA:  Acute respiratory failure. EXAM: PORTABLE CHEST 1 VIEW COMPARISON:  04/29/2018. FINDINGS: Low lung volumes. LEFT ventricular hypertrophy. Perihilar opacities appear similar without significant improvement. ET tube 2 cm above carina, should be pulled back approximately 2 cm. Nasogastric tube in the stomach. IMPRESSION: Stable perihilar densities.  ETT too low, 2 cm above. Electronically Signed   By: Staci Righter M.D.   On: 04/30/2018 07:24   Dg Chest Port 1 View  Result Date: 04/29/2018 CLINICAL DATA:  Endotracheal tube and orogastric tube placement. EXAM: PORTABLE CHEST 1 VIEW COMPARISON:  Chest radiograph performed 11/24/2017 FINDINGS: The patient's endotracheal tube is seen ending 3 cm above the carina. An enteric tube is noted overlying the body of the stomach. The lungs are well-aerated. Right perihilar airspace opacification may reflect pneumonia. There is no evidence of pleural effusion or pneumothorax. The cardiomediastinal silhouette is within normal limits. No acute osseous  abnormalities are seen. IMPRESSION: 1. Endotracheal tube seen ending 3 cm above the carina. 2. Enteric tube noted overlying the body of the stomach. 3. Right perihilar airspace opacification may reflect pneumonia. Followup PA and lateral chest X-ray is recommended in 3-4 weeks following trial of antibiotic therapy to ensure resolution and exclude underlying malignancy. Electronically Signed   By: Garald Balding M.D.   On: 04/29/2018 06:29   Ct Head Code Stroke Wo Contrast  Result Date: 04/29/2018 CLINICAL DATA:  Code stroke. LEFT facial droop, seizure. Follow-up code stroke. Last seen normal at 2100 hours. EXAM: CT HEAD WITHOUT CONTRAST TECHNIQUE: Contiguous axial images were obtained from the base of the skull through the vertex without intravenous contrast. COMPARISON:  MRI of the head November 27, 2017 FINDINGS: Moderately motion degraded examination. BRAIN: No intraparenchymal hemorrhage, mass effect nor midline shift. Mild parenchymal brain volume loss for age. No hydrocephalus. No acute large vascular territory infarcts. No abnormal extra-axial fluid collections. Basal cisterns are patent. VASCULAR: Mild-to-moderate calcific atherosclerosis of the carotid siphons. SKULL: No skull fracture. Focal LEFT maxillary fibrous dysplasia. No significant scalp soft tissue swelling. LEFT suboccipital scalp lipoma. Scalp calcifications. SINUSES/ORBITS: The mastoid air-cells and included paranasal sinuses are well-aerated.The included ocular globes and orbital contents are non-suspicious. OTHER: Patient is edentulous. ASPECTS [facility] Stroke Program Early CT Score) - Ganglionic level infarction (caudate, lentiform nuclei, internal capsule, insula, M1-M3 cortex): 7 - Supraganglionic infarction (M4-M6 cortex): 3 Total score (0-10 with 10 being normal): 10 IMPRESSION: 1. No acute intracranial process on this motion degraded examination. 2. Mild parenchymal brain volume loss for age. 3. ASPECTS is 10. 4. Critical  Value/emergent results text paged to [place], Neurology via [facility] system on 04/29/2018 at 3:56 am, including interpreting physician's phone number. Electronically Signed   By: Elon Alas M.D.   On: 04/29/2018 03:58  Medications:  Scheduled: . chlorhexidine gluconate (MEDLINE KIT)  15 mL Mouth Rinse BID  . [START ON 05/01/2018] darbepoetin (ARANESP) injection - DIALYSIS  100 mcg Intravenous Q Wed-HD  . gentamicin cream  1 application Topical Daily  . heparin  5,000 Units Subcutaneous Q8H  . levETIRAcetam  250 mg Per Tube BID  . mouth rinse  15 mL Mouth Rinse 10 times per day  . pantoprazole sodium  40 mg Per Tube Daily   Continuous: . sodium chloride    . ceFEPime (MAXIPIME) IV Stopped (04/29/18 1823)  . dialysis solution 4.25% low-MG/low-CA    . insulin (NOVOLIN-R) infusion 9.9 Units/hr (04/30/18 7867)  . magnesium sulfate 1 - 4 g bolus IVPB    . niCARDipine Stopped (04/29/18 2330)  . propofol (DIPRIVAN) infusion 50 mcg/kg/min (04/30/18 0355)   EEG 6/3: This EEG is abnormal due to moderate diffuse suppression and background slowing. Clinical Correlation of the above findings indicates diffuse cerebral dysfunction that is non-specific in etiology and can be seen with hypoxic/ischemic injury, toxic/metabolic encephalopathies, neurodegenerative disorders, or medication effect from Ativan given prior to EEG. There were no electrographic seizures in this study.  The absence of epileptiform discharges does not rule out a clinical diagnosis of epilepsy.  Clinical correlation is advised.  Assessment: 62 year old male with ESRD, DM and HTN, prior history of provoked seizures, presents with seizure activity in the setting of HTN and severe hyperglycemia with blood sugars in 900s. Had postictal Todd's paralysis on initial neurological evaluation.  1. Blood sugars normalizing 2. Still on sedation with propofol at rate of 50, which compromises neurological exam 3. No evidence for seizure  activity on examination. EEG yesterday showed no electrographic seizures; diffuse suppression and background slowing were noted.   Recommendations: 1. Wean off propofol 2. Continue Keppra renal dosing at 250 mg BID 3. Call neurology for repeat exam once off sedation  35 minutes of time spent in the neurological evaluation and management of this critically ill patient.    LOS: 1 day   '@Electronically'$  signed: Dr. Kerney Elbe 04/30/2018  7:32 AM

## 2018-04-30 NOTE — Progress Notes (Deleted)
Multiple phone calls and messages left since 0130 to inform family of pt's condition. Family returned called this morning 0650 and nurse informed son of pt's passing.

## 2018-05-01 ENCOUNTER — Inpatient Hospital Stay (HOSPITAL_COMMUNITY): Payer: Medicare Other

## 2018-05-01 DIAGNOSIS — R739 Hyperglycemia, unspecified: Secondary | ICD-10-CM

## 2018-05-01 DIAGNOSIS — G9341 Metabolic encephalopathy: Secondary | ICD-10-CM

## 2018-05-01 DIAGNOSIS — J9600 Acute respiratory failure, unspecified whether with hypoxia or hypercapnia: Secondary | ICD-10-CM

## 2018-05-01 DIAGNOSIS — N2581 Secondary hyperparathyroidism of renal origin: Secondary | ICD-10-CM | POA: Diagnosis not present

## 2018-05-01 DIAGNOSIS — N186 End stage renal disease: Secondary | ICD-10-CM | POA: Diagnosis not present

## 2018-05-01 DIAGNOSIS — Z992 Dependence on renal dialysis: Secondary | ICD-10-CM

## 2018-05-01 LAB — HEPATITIS B SURFACE ANTIBODY,QUALITATIVE: Hep B S Ab: REACTIVE

## 2018-05-01 LAB — MAGNESIUM
MAGNESIUM: 2.2 mg/dL (ref 1.7–2.4)
Magnesium: 1.5 mg/dL — ABNORMAL LOW (ref 1.7–2.4)

## 2018-05-01 LAB — PHOSPHORUS
Phosphorus: 5.5 mg/dL — ABNORMAL HIGH (ref 2.5–4.6)
Phosphorus: 3.6 mg/dL (ref 2.5–4.6)

## 2018-05-01 LAB — HEPATITIS B CORE ANTIBODY, IGM: HEP B C IGM: NEGATIVE

## 2018-05-01 LAB — RENAL FUNCTION PANEL
ANION GAP: 12 (ref 5–15)
Albumin: 2 g/dL — ABNORMAL LOW (ref 3.5–5.0)
BUN: 62 mg/dL — AB (ref 6–20)
CALCIUM: 8.1 mg/dL — AB (ref 8.9–10.3)
CO2: 24 mmol/L (ref 22–32)
Chloride: 97 mmol/L — ABNORMAL LOW (ref 101–111)
Creatinine, Ser: 10.69 mg/dL — ABNORMAL HIGH (ref 0.61–1.24)
GFR calc Af Amer: 5 mL/min — ABNORMAL LOW (ref >60)
GFR, EST NON AFRICAN AMERICAN: 4 mL/min — AB (ref >60)
Glucose, Bld: 162 mg/dL — ABNORMAL HIGH (ref 65–99)
POTASSIUM: 4.1 mmol/L (ref 3.5–5.1)
Phosphorus: 4.3 mg/dL (ref 2.5–4.6)
Sodium: 133 mmol/L — ABNORMAL LOW (ref 135–145)

## 2018-05-01 LAB — HEPATITIS C ANTIBODY (REFLEX): HCV Ab: <0.1 s/co ratio (ref 0.0–0.9)

## 2018-05-01 LAB — PARATHYROID HORMONE, INTACT (NO CA): PTH: 192 pg/mL — AB (ref 15–65)

## 2018-05-01 LAB — GLUCOSE, CAPILLARY
GLUCOSE-CAPILLARY: 168 mg/dL — AB (ref 65–99)
GLUCOSE-CAPILLARY: 84 mg/dL (ref 65–99)
Glucose-Capillary: 155 mg/dL — ABNORMAL HIGH (ref 65–99)
Glucose-Capillary: 134 mg/dL — ABNORMAL HIGH (ref 65–99)
Glucose-Capillary: 285 mg/dL — ABNORMAL HIGH (ref 65–99)

## 2018-05-01 LAB — HEPATITIS B SURFACE ANTIGEN: HEP B S AG: NEGATIVE

## 2018-05-01 LAB — VANCOMYCIN, RANDOM: Vancomycin Rm: 17

## 2018-05-01 LAB — CBC
HEMATOCRIT: 28.5 % — AB (ref 39.0–52.0)
Hemoglobin: 9.6 g/dL — ABNORMAL LOW (ref 13.0–17.0)
MCH: 26.4 pg (ref 26.0–34.0)
MCHC: 33.7 g/dL (ref 30.0–36.0)
MCV: 78.3 fL (ref 78.0–100.0)
Platelets: 159 10*3/uL (ref 150–400)
RBC: 3.64 MIL/uL — ABNORMAL LOW (ref 4.22–5.81)
RDW: 14.6 % (ref 11.5–15.5)
WBC: 8.3 10*3/uL (ref 4.0–10.5)

## 2018-05-01 LAB — HCV COMMENT:

## 2018-05-01 LAB — PROCALCITONIN: Procalcitonin: 3.17 ng/mL

## 2018-05-01 MED ORDER — CHLORHEXIDINE GLUCONATE CLOTH 2 % EX PADS: 6.0000 | MEDICATED_PAD | Freq: Every day | CUTANEOUS | Status: DC

## 2018-05-01 MED ORDER — AMLODIPINE BESYLATE 10 MG PO TABS
10.0000 mg | ORAL_TABLET | Freq: Every day | ORAL | Status: DC
  Administered 2018-05-01: 10 mg
  Filled 2018-05-01: qty 1

## 2018-05-01 MED ORDER — VITAL AF 1.2 CAL PO LIQD
1000.0000 mL | ORAL | Status: DC
Start: 2018-05-01 — End: 2018-05-02
  Administered 2018-05-01 – 2018-05-02 (×2): 1000 mL

## 2018-05-01 MED ORDER — PENTAFLUOROPROP-TETRAFLUOROETH EX AERO: 1.0000 "application " | INHALATION_SPRAY | CUTANEOUS | Status: DC | PRN

## 2018-05-01 MED ORDER — LIDOCAINE HCL (PF) 1 % IJ SOLN: 5.0000 mL | INTRAMUSCULAR | Status: DC | PRN

## 2018-05-01 MED ORDER — DARBEPOETIN ALFA 100 MCG/0.5ML IJ SOSY
PREFILLED_SYRINGE | INTRAMUSCULAR | Status: AC
  Administered 2018-05-01: 100 ug via INTRAVENOUS
  Filled 2018-05-01: qty 0.5

## 2018-05-01 MED ORDER — HYDRALAZINE HCL 20 MG/ML IJ SOLN
5.0000 mg | Freq: Four times a day (QID) | INTRAMUSCULAR | Status: DC | PRN
  Administered 2018-05-01 (×2): 10 mg via INTRAVENOUS
  Administered 2018-05-03: 20 mg via INTRAVENOUS
  Filled 2018-05-01 (×2): qty 1

## 2018-05-01 MED ORDER — LIDOCAINE-PRILOCAINE 2.5-2.5 % EX CREA
1.0000 "application " | TOPICAL_CREAM | CUTANEOUS | Status: DC | PRN
  Filled 2018-05-01: qty 5

## 2018-05-01 MED ORDER — CHLORHEXIDINE GLUCONATE CLOTH 2 % EX PADS
6.0000 | MEDICATED_PAD | Freq: Every day | CUTANEOUS | Status: DC
  Administered 2018-05-01 – 2018-05-04 (×4): 6 via TOPICAL

## 2018-05-01 MED ORDER — PRO-STAT SUGAR FREE PO LIQD
30.0000 mL | Freq: Two times a day (BID) | ORAL | Status: DC
  Administered 2018-05-01: 30 mL
  Filled 2018-05-01: qty 30

## 2018-05-01 MED ORDER — MAGNESIUM SULFATE 2 GM/50ML IV SOLN
2.0000 g | Freq: Once | INTRAVENOUS | Status: AC
  Administered 2018-05-01: 2 g via INTRAVENOUS

## 2018-05-01 MED ORDER — HEPARIN SODIUM (PORCINE) 1000 UNIT/ML DIALYSIS
100.0000 [IU]/kg | INTRAMUSCULAR | Status: DC | PRN
  Filled 2018-05-01: qty 8

## 2018-05-01 MED ORDER — MIDAZOLAM HCL 2 MG/2ML IJ SOLN: 2.0000 mg | INTRAMUSCULAR | Status: DC | PRN

## 2018-05-01 MED ORDER — VITAL HIGH PROTEIN PO LIQD: 1000.0000 mL | ORAL | Status: DC

## 2018-05-01 MED ORDER — ALTEPLASE 2 MG IJ SOLR
2.0000 mg | Freq: Once | INTRAMUSCULAR | Status: DC | PRN
  Filled 2018-05-01: qty 2

## 2018-05-01 MED ORDER — SODIUM CHLORIDE 0.9 % IV SOLN: 100.0000 mL | INTRAVENOUS | Status: DC | PRN

## 2018-05-01 MED ORDER — HEPARIN SODIUM (PORCINE) 1000 UNIT/ML DIALYSIS: 1000.0000 [IU] | INTRAMUSCULAR | Status: DC | PRN

## 2018-05-01 MED ORDER — VANCOMYCIN HCL IN DEXTROSE 750-5 MG/150ML-% IV SOLN
750.0000 mg | INTRAVENOUS | Status: DC
  Filled 2018-05-01: qty 150

## 2018-05-01 MED ORDER — NICARDIPINE HCL IN NACL 20-0.86 MG/200ML-% IV SOLN
3.0000 mg/h | INTRAVENOUS | Status: DC
  Administered 2018-05-01: 5 mg/h via INTRAVENOUS
  Administered 2018-05-01: 7.5 mg/h via INTRAVENOUS
  Administered 2018-05-01: 5 mg/h via INTRAVENOUS
  Administered 2018-05-01 – 2018-05-02 (×3): 7.5 mg/h via INTRAVENOUS
  Administered 2018-05-02: 10 mg/h via INTRAVENOUS
  Administered 2018-05-02 (×2): 5 mg/h via INTRAVENOUS
  Administered 2018-05-02: 10 mg/h via INTRAVENOUS
  Administered 2018-05-02: 7.5 mg/h via INTRAVENOUS
  Administered 2018-05-02: 10 mg/h via INTRAVENOUS
  Administered 2018-05-02: 7.5 mg/h via INTRAVENOUS
  Administered 2018-05-02 – 2018-05-03 (×8): 10 mg/h via INTRAVENOUS
  Filled 2018-05-01 (×13): qty 200
  Filled 2018-05-01: qty 400
  Filled 2018-05-01 (×5): qty 200

## 2018-05-01 MED ORDER — CLONIDINE HCL 0.1 MG PO TABS
0.1000 mg | ORAL_TABLET | Freq: Two times a day (BID) | ORAL | Status: DC
  Administered 2018-05-01 – 2018-05-02 (×3): 0.1 mg
  Filled 2018-05-01 (×4): qty 1

## 2018-05-01 NOTE — Plan of Care (Signed)
  Problem: Clinical Measurements: Goal: Ability to maintain clinical measurements within normal limits will improve Outcome: Progressing Note:  Cardene gtt infusing Goal: Will remain free from infection Outcome: Progressing Goal: Diagnostic test results will improve Outcome: Progressing Goal: Respiratory complications will improve Outcome: Progressing Note:  ETT in place on ventilator at this time, weaned 6/5   Problem: Nutrition: Goal: Adequate nutrition will be maintained Outcome: Progressing Note:  Tube feeds infusing   Problem: Education: Goal: Knowledge of General Education information will improve Outcome: Not Progressing Note:  Patient not able to participate in education, no family at bedside at this time.   Problem: Health Behavior/Discharge Planning: Goal: Ability to manage health-related needs will improve Outcome: Not Progressing Note:  Minimally responsive, not following commands   Problem: Elimination: Goal: Will not experience complications related to urinary retention Outcome: Not Progressing Note:  Foley in place

## 2018-05-01 NOTE — Progress Notes (Signed)
Inpatient Diabetes Program Recommendations  AACE/ADA: New Consensus Statement on Inpatient Glycemic Control (2015)  Target Ranges:  Prepandial:   less than 140 mg/dL      Peak postprandial:   less than 180 mg/dL (1-2 hours)      Critically ill patients:  140 - 180 mg/dL   Lab Results  Component Value Date   GLUCAP 84 05/01/2018   HGBA1C 6.3 (H) 02/13/2018    Review of Glycemic Control Results for AKEEM, HEPPLER (MRN [id number]) as of 05/01/2018 10:14  Ref. Range 04/30/2018 20:56 04/30/2018 23:43 05/01/2018 03:31 05/01/2018 07:23  Glucose-Capillary Latest Ref Range: 65 - 99 mg/dL 394 (H) 239 (H) 168 (H) 84   Diabetes history: Type 2 DM Outpatient Diabetes medications: NPH 20 units QAM Current orders for Inpatient glycemic control: Novolog 0-15 units TID  Inpatient Diabetes Program Recommendations:    Given renal status, would anticipate higher risk of hypoglycemia with current inpatient regimen. Consider decreasing correction to Novolog 0-9 units Q4H and adding Levemir 10 units QD.  Thanks, Bronson Curb, MSN, RNC-OB Diabetes Coordinator [phone] (8a-5p)

## 2018-05-01 NOTE — Progress Notes (Signed)
Subjective: Interval History: on vent, PD cath capped off. .  Objective: Vital signs in last 24 hours: Temp:  [98.8 F (37.1 C)-99.7 F (37.6 C)] 99.3 F (37.4 C) (06/05 0600) Pulse Rate:  [76-110] 86 (06/05 0600) Resp:  [15-22] 15 (06/05 0600) BP: (113-233)/(69-151) 120/78 (06/05 0600) SpO2:  [99 %-100 %] 100 % (06/05 0600) FiO2 (%):  [40 %] 40 % (06/05 0355) Weight:  [74.3 kg (163 lb 12.8 oz)-78.2 kg (172 lb 6.4 oz)] 74.3 kg (163 lb 12.8 oz) (06/05 0344) Weight change: -3.5 kg (-7 lb 11.5 oz)  Intake/Output from previous day: 06/04 0701 - 06/05 0700 In: 13221.9 [I.V.:336.9; NG/GT:40; IV Piggyback:100] Out: 200 [Urine:200] Intake/Output this shift: Total I/O In: 368.6 [I.V.:268.6; IV Piggyback:100] Out: 100 [Urine:100]  General appearance: sedated, on vent, not responsive. Resp: clear to auscultation bilaterally Cardio: S1, S2 normal and systolic murmur: systolic ejection 2/6, decrescendo at 2nd left intercostal space GI: pos bs, soft, PD cath L abdm.  Extremities: AVF LUA  Lab Results: Recent Labs    04/30/18 0451 05/01/18 0546  WBC 7.5 8.3  HGB 10.3* 9.6*  HCT 30.2* 28.5*  PLT 149* 159   BMET:  Recent Labs    04/30/18 0451 05/01/18 0544  NA 134* 133*  K 3.8 4.1  CL 96* 97*  CO2 21* 24  GLUCOSE 223* 162*  BUN 57* 62*  CREATININE 9.61* 10.69*  CALCIUM 8.7* 8.1*   No results for input(s): PTH in the last 72 hours. Iron Studies:  Recent Labs    04/30/18 1046  IRON 94  TIBC 154*    Studies/Results: Mr Brain Wo Contrast  Result Date: 04/29/2018 CLINICAL DATA:  New onset seizure EXAM: MRI HEAD WITHOUT CONTRAST TECHNIQUE: Multiplanar, multiecho pulse sequences of the brain and surrounding structures were obtained without intravenous contrast. COMPARISON:  Head CT 04/29/2018 FINDINGS: BRAIN: The midline structures are normal. There is no acute infarct or acute hemorrhage. There is no mass lesion or other mass effect. There is no hydrocephalus, dural  abnormality or extra-axial collection. The white matter signal is normal for the patient's age. Generalized atrophy without lobar predilection. No chronic microhemorrhage or superficial siderosis. VASCULAR: Major intracranial arterial and venous sinus flow voids are preserved. SKULL AND UPPER CERVICAL SPINE: The visualized skull base, calvarium, upper cervical spine and extracranial soft tissues are normal. SINUSES/ORBITS: No fluid levels or advanced mucosal thickening. No mastoid or middle ear effusion. The orbits are normal. IMPRESSION: Generalized volume loss without lobar predilection. No acute abnormality or focal finding to suggest seizure etiology. Electronically Signed   By: Ulyses Jarred M.D.   On: 04/29/2018 17:26   Dg Chest Port 1 View  Result Date: 04/30/2018 CLINICAL DATA:  62 year old male with central line placement. Subsequent encounter. EXAM: PORTABLE CHEST 1 VIEW COMPARISON:  04/30/2018 5 a.m. FINDINGS: Endotracheal tube tip 4.3 cm above the carina. Right internal jugular catheter placed with tip cavoatrial junction level. Nasogastric tube curled within the stomach tip not imaged on present exam. Bandlike atelectasis left infrahilar region. No infiltrate, congestive heart failure or pneumothorax. Heart size within normal limits. IMPRESSION: Right central line tip caval atrial junction level. No pneumothorax. Bandlike atelectasis left infrahilar region. Interval improved aeration left lung base. Electronically Signed   By: Genia Del M.D.   On: 04/30/2018 20:09   Dg Chest Port 1 View  Result Date: 04/30/2018 CLINICAL DATA:  Acute respiratory failure. EXAM: PORTABLE CHEST 1 VIEW COMPARISON:  04/29/2018. FINDINGS: Low lung volumes. LEFT ventricular hypertrophy. Perihilar opacities  appear similar without significant improvement. ET tube 2 cm above carina, should be pulled back approximately 2 cm. Nasogastric tube in the stomach. IMPRESSION: Stable perihilar densities.  ETT too low, 2 cm above.  Electronically Signed   By: Staci Righter M.D.   On: 04/30/2018 07:24    I have reviewed the patient's current medications.  Assessment/Plan: 1 ESRD will do HD today. Lower vol and solute 2 Anemia stable 3 VDRF per CCM 4 HTN lower vol , lower meds 5 SZ per Neuro on Keppra 6  DM controlled 7 HPTH P HD, esa, lower bp meds,     LOS: 2 days   Jared [name] Jared [name] 05/01/2018,6:57 AM

## 2018-05-01 NOTE — Progress Notes (Signed)
PULMONARY / CRITICAL CARE MEDICINE   Name: Jared [name] MRN: [id number] DOB: 1956-06-02    ADMISSION DATE:  04/29/2018 CONSULTATION DATE:  04/29/2018  REFERRING MD:  Dr. Roxanne Mins   CHIEF COMPLAINT:  Hyperglycemia/Seziures   HISTORY OF PRESENT ILLNESS:   62 year old male with PMH of Anemia, ESRD on PD, DM, GERD, HTN, CVA, Hypothyroidism, Seizures. Presents to ED on 6/3 with leftward gaze deviation, right-side jerking, aphasia, and AMS. Wife states he was in usual state of health. Out all day sightseeing, skipped PD. Early this AM found to be restless and grunting. Per EMS patient noted to be 2 seizures on route to ED, given total 5 mg versed. Code Stroke Called. CT Head Negative. Glucose 912. WBC 4.5. Neurology consulted. Suspected seizure due to hyperglycemia. Upon evaluation in ED patient not protecting airway.  SUBJECTIVE:  Final EEG report without any evidence of residual seizures MRI performed 6/3 as below, no evidence of acute stroke or explanation for seizure activity. Nicardipine has been weaned to off Insulin drip has been weaned significantly as well, still running. D5 0.45NS held Peritoneal dialysate was removed this morning  VITAL SIGNS: BP (!) 176/93   Pulse 83   Temp 99.1 F (37.3 C)   Resp 15   Ht 6\' 1"  (1.854 m)   Wt 74.3 kg (163 lb 12.8 oz)   SpO2 100%   BMI 21.61 kg/m   HEMODYNAMICS:    VENTILATOR SETTINGS: Vent Mode: PRVC FiO2 (%):  [40 %] 40 % Set Rate:  [15 bmp] 15 bmp Vt Set:  [640 mL] 640 mL PEEP:  [5 cmH20] 5 cmH20 Plateau Pressure:  [17 cmH20-20 cmH20] 19 cmH20  INTAKE / OUTPUT: I/O last 3 completed shifts: In: 14417.3 [I.V.:1532.3; ERDEY:81448; NG/GT:40; IV Piggyback:100] Out: 10 [Urine:230]  PHYSICAL EXAMINATION: General: Ill-appearing, sedated on propofol Neuro: Some grimace and turn head to stimulation, does not follow commands, positive cough HEENT: Endotracheal tube in place, oropharynx dry Cardiovascular: Regular, no murmur, tachycardia  is improved Lungs: Coarse bilateral breath sounds Abdomen: Mild distention, hypoactive bowel sounds, peritoneal catheter in place Musculoskeletal: Trace pretibial edema Skin: Left upper extremity AV fistula, good thrill  LABS:  BMET Recent Labs  Lab 04/30/18 0047 04/30/18 0451 05/01/18 0544  NA 133* 134* 133*  K 3.9 3.8 4.1  CL 96* 96* 97*  CO2 21* 21* 24  BUN 60* 57* 62*  CREATININE 9.93* 9.61* 10.69*  GLUCOSE 227* 223* 162*    Electrolytes Recent Labs  Lab 04/29/18 0412  04/30/18 0047 04/30/18 0451 05/01/18 0544  CALCIUM  --    < > 8.7* 8.7* 8.1*  MG 1.6*  --   --  1.4*  --   PHOS  --   --   --  3.0 4.3   < > = values in this interval not displayed.    CBC Recent Labs  Lab 04/29/18 0339 04/30/18 0451 05/01/18 0546  WBC 4.5 7.5 8.3  HGB 11.7* 10.3* 9.6*  HCT 36.5* 30.2* 28.5*  PLT 160 149* 159    Coag's Recent Labs  Lab 04/29/18 0339  APTT 26  INR 0.98    Sepsis Markers Recent Labs  Lab 04/29/18 0837 04/29/18 1137 04/29/18 2154 04/30/18 1046 05/01/18 0544  LATICACIDVEN 5.2* 5.8* 3.3*  --   --   PROCALCITON 1.12  --   --  2.83 3.17    ABG Recent Labs  Lab 04/29/18 0603 04/29/18 0731  PHART 7.344* 7.365  PCO2ART 47.5 39.5  PO2ART 307.0* 134.0*  Liver Enzymes Recent Labs  Lab 04/29/18 0339 05/01/18 0544  AST 28  --   ALT 19  --   ALKPHOS 60  --   BILITOT 0.9  --   ALBUMIN 3.2* 2.0*    Cardiac Enzymes Recent Labs  Lab 04/29/18 0837 04/29/18 1358 04/29/18 1900  TROPONINI <0.03 0.03* 0.04*    Glucose Recent Labs  Lab 04/30/18 1213 04/30/18 1537 04/30/18 2056 04/30/18 2343 05/01/18 0331 05/01/18 0723  GLUCAP 91 152* 394* 239* 168* 84    Imaging Dg Chest Port 1 View  Result Date: 05/01/2018 CLINICAL DATA:  Hypoxia EXAM: PORTABLE CHEST 1 VIEW COMPARISON:  April 30, 2018 common April 29, 2018, and March 14, 2017 FINDINGS: Endotracheal tube tip is 3.0 cm above the carina. Central catheter tip is at the cavoatrial  junction. Nasogastric tube tip and side port are in the stomach. No pneumothorax. There is persistent atelectatic change in the left lower lung zone. Lungs elsewhere are clear. Heart size and pulmonary vascularity are normal. No adenopathy. No bone lesions. IMPRESSION: Persistent left lower lobe atelectatic change. A small focus of superimposed pneumonia cannot be entirely excluded in this area. Lungs elsewhere are clear. Heart size normal. Tube and catheter positions as described without pneumothorax. Electronically Signed   By: Lowella Grip III M.D.   On: 05/01/2018 08:19   Dg Chest Port 1 View  Result Date: 04/30/2018 CLINICAL DATA:  62 year old male with central line placement. Subsequent encounter. EXAM: PORTABLE CHEST 1 VIEW COMPARISON:  04/30/2018 5 a.m. FINDINGS: Endotracheal tube tip 4.3 cm above the carina. Right internal jugular catheter placed with tip cavoatrial junction level. Nasogastric tube curled within the stomach tip not imaged on present exam. Bandlike atelectasis left infrahilar region. No infiltrate, congestive heart failure or pneumothorax. Heart size within normal limits. IMPRESSION: Right central line tip caval atrial junction level. No pneumothorax. Bandlike atelectasis left infrahilar region. Interval improved aeration left lung base. Electronically Signed   By: Genia Del M.D.   On: 04/30/2018 20:09     STUDIES:  CT Head 6/03 > 1. No acute intracranial process on this motion degraded examination. Mild parenchymal brain volume loss for age. 6/3 EEG > This EEG is abnormal due to moderate diffuse suppression and background slowing. There were no epileptiform discharges or electrographic seizures seen  CULTURES: Blood 6/3 >> Tracheal Asp 6/3 >> Peritoneal fluid 6/3 >>   ANTIBIOTICS: Aztreonam 6/3  >> 6/3 cefepime 6/3 >>  Vanco 6/3 >> 6/5  SIGNIFICANT EVENTS: 6/3 > Presents to ED   LINES/TUBES: ETT 6/3 >>  DISCUSSION: 62 year old male presents post-ictal  and hyperglycemic requiring intubation for airway protection.  There was some concern for aspiration event and he was treated with broad antibiotics. EEG did not describe fever. Course complicated by ongoing encephalopathy and failure to wean. Refractory hypertension.   ASSESSMENT / PLAN:  Respiratory Insufficieny in setting of encephalopathy  Possible aspiration pneumonia P:   Vent support Failed wean this AM. Hopefully volume removal with HD will help here.   Antibiotics as below   HTN  P:  Continue home metoprolol, clonidine, amlodipine now. Add back the remainder of his antihypertensive regimen in the coming days as blood pressure dictates. Will see how he responds to HD. PRN hydralazine. Goal systolic now < [id number]  ESRD on PD.  Hypomagnesemia   Acute anion gap metabolic acidosis, lactic acidosis; improved P:   Appreciate nephrology assistance Plan to start HD today Continue to follow BMP KVO  GERD  P:   Tube feeding in the next 24 hours if he is not an extubation candidate today Continue PPI as ordered   Anemia of Chronic Disease  Thrombocytopenia - on previous admission 153, stable P:  Follow CBC, no current evidence of any bleeding Heparin for DVT prophylaxis Transfusion goal Hgb > 7  Aspiration Event, possible aspiration pneumonia Severe sepsis, occult septic shock (elevated lactate); at risk for peritonitis given his indwelling catheter, peritoneal dialysis P:   DC vanco Continue cefepime.   Severe hyperglycemia, HONC P:   Insulin gtt off SSI Following frequent BMP  Encephalopathy in setting of post-ictal state  No clear evidence for continued seizures, no evidence for a reciprocating cause on MRI brain.  Suspect that this is all toxic metabolic in the setting of hyperglycemia, hypertension. Seizure in setting of hyperglycemiaH/O Seizures   P:   RASS goal: 0 to -1 DC propofol PRN sedation only.  Continue Keppra as ordered  FAMILY  - Updates: Sister  Vaughan Basta updated bedside.   - Inter-disciplinary family meet or Palliative Care meeting due by: 05/06/2018  Georgann Housekeeper, AGACNP-BC [place] [facility] Pager [phone] or [phone]  05/01/2018 9:39 AM

## 2018-05-01 NOTE — Procedures (Signed)
I was present at this session.  I have reviewed the session itself and made appropriate changes.  HD via LUA avf. Some prob holding arm still, now lower flows.  Hemodynamics tol well  [place] Area 6/5/20192:40 PM

## 2018-05-01 NOTE — Progress Notes (Signed)
Nutrition Follow-up / Consult  DOCUMENTATION CODES:   Not applicable  INTERVENTION:    Vital AF 1.2 at 65 ml/h (1560 ml per day)   Provides 1872 kcal, 117 gm protein, 1265 ml free water daily  NUTRITION DIAGNOSIS:   Inadequate oral intake related to inability to eat as evidenced by NPO status.  Ongoing  GOAL:   Patient will meet greater than or equal to 90% of their needs  Will be met with TF  MONITOR:   Vent status, Labs, I & O's  REASON FOR ASSESSMENT:   Consult Enteral/tube feeding initiation and management  ASSESSMENT:   62 yo male with PMH of ESRD on PD, HTN, anemia, thyroid DZ, DM, stroke, GERD, and seizures who was admitted on 6/3 with hyperglycemia and seizures, requiring intubation for airway protection.  Discussed patient with RN today. Received MD Consult for TF initiation and management. OGT in place. Patient was receiving peritoneal dialysis, plans for HD today.  Patient remains intubated on ventilator support MV: 7.5 L/min Temp (24hrs), Avg:99.2 F (37.3 C), Min:98.8 F (37.1 C), Max:99.7 F (37.6 C)  Propofol: discontinued Labs reviewed. Sodium 133 (L), potassium & phosphorus WNL CBG's: 84-155 Medications reviewed and include Novolog, magnesium sulfate.   Diet Order:   Diet Order           Diet NPO time specified  Diet effective now          EDUCATION NEEDS:   No education needs have been identified at this time  Skin:  Skin Assessment: Reviewed RN Assessment  Last BM:  PTA  Height:   Ht Readings from Last 1 Encounters:  04/29/18 _0  (1.854 m)    Weight:   Wt Readings from Last 1 Encounters:  05/01/18 163 lb 12.8 oz (74.3 kg)    Ideal Body Weight:  83.6 kg  BMI:  Body mass index is 21.61 kg/m.  Estimated Nutritional Needs:   Kcal:  0721  Protein:  105-120 gm  Fluid:  1.5 L    Molli Barrows, RD, LDN, [place] Pager [phone] After Hours Pager [phone]

## 2018-05-02 ENCOUNTER — Other Ambulatory Visit: Payer: Self-pay

## 2018-05-02 DIAGNOSIS — N186 End stage renal disease: Secondary | ICD-10-CM | POA: Diagnosis not present

## 2018-05-02 DIAGNOSIS — N2581 Secondary hyperparathyroidism of renal origin: Secondary | ICD-10-CM | POA: Diagnosis not present

## 2018-05-02 LAB — GLUCOSE, CAPILLARY
GLUCOSE-CAPILLARY: 197 mg/dL — AB (ref 65–99)
GLUCOSE-CAPILLARY: 214 mg/dL — AB (ref 65–99)
GLUCOSE-CAPILLARY: 315 mg/dL — AB (ref 65–99)
GLUCOSE-CAPILLARY: 333 mg/dL — AB (ref 65–99)
Glucose-Capillary: 308 mg/dL — ABNORMAL HIGH (ref 65–99)
Glucose-Capillary: 181 mg/dL — ABNORMAL HIGH (ref 65–99)
Glucose-Capillary: 164 mg/dL — ABNORMAL HIGH (ref 65–99)

## 2018-05-02 LAB — RENAL FUNCTION PANEL
ANION GAP: 11 (ref 5–15)
Albumin: 2.2 g/dL — ABNORMAL LOW (ref 3.5–5.0)
BUN: 33 mg/dL — ABNORMAL HIGH (ref 6–20)
CHLORIDE: 98 mmol/L — AB (ref 101–111)
CO2: 26 mmol/L (ref 22–32)
Calcium: 8.1 mg/dL — ABNORMAL LOW (ref 8.9–10.3)
Creatinine, Ser: 5.91 mg/dL — ABNORMAL HIGH (ref 0.61–1.24)
GFR, EST AFRICAN AMERICAN: 10 mL/min — AB (ref >60)
GFR, EST NON AFRICAN AMERICAN: 9 mL/min — AB (ref >60)
Glucose, Bld: 359 mg/dL — ABNORMAL HIGH (ref 65–99)
POTASSIUM: 3.4 mmol/L — AB (ref 3.5–5.1)
Phosphorus: 1.7 mg/dL — ABNORMAL LOW (ref 2.5–4.6)
Sodium: 135 mmol/L (ref 135–145)

## 2018-05-02 LAB — CBC
HEMATOCRIT: 30 % — AB (ref 39.0–52.0)
HEMOGLOBIN: 9.8 g/dL — AB (ref 13.0–17.0)
MCH: 26.7 pg (ref 26.0–34.0)
MCHC: 32.7 g/dL (ref 30.0–36.0)
MCV: 81.7 fL (ref 78.0–100.0)
PLATELETS: 177 10*3/uL (ref 150–400)
RBC: 3.67 MIL/uL — AB (ref 4.22–5.81)
RDW: 15 % (ref 11.5–15.5)
WBC: 8.2 10*3/uL (ref 4.0–10.5)

## 2018-05-02 LAB — MAGNESIUM
MAGNESIUM: 2.1 mg/dL (ref 1.7–2.4)
Magnesium: 2.2 mg/dL (ref 1.7–2.4)

## 2018-05-02 LAB — PHOSPHORUS
PHOSPHORUS: 3.9 mg/dL (ref 2.5–4.6)
Phosphorus: 1.7 mg/dL — ABNORMAL LOW (ref 2.5–4.6)

## 2018-05-02 LAB — TRIGLYCERIDES: TRIGLYCERIDES: 79 mg/dL (ref <150)

## 2018-05-02 MED ORDER — CHLORHEXIDINE GLUCONATE CLOTH 2 % EX PADS: 6.0000 | MEDICATED_PAD | Freq: Every day | CUTANEOUS | Status: DC

## 2018-05-02 MED ORDER — SODIUM GLYCEROPHOSPHATE 1 MMOLE/ML IV SOLN
20.0000 mmol | Freq: Once | INTRAVENOUS | Status: AC
  Administered 2018-05-02: 20 mmol via INTRAVENOUS
  Filled 2018-05-02: qty 20

## 2018-05-02 MED ORDER — ORAL CARE MOUTH RINSE
15.0000 mL | Freq: Two times a day (BID) | OROMUCOSAL | Status: DC
  Administered 2018-05-02 – 2018-05-05 (×5): 15 mL via OROMUCOSAL

## 2018-05-02 MED ORDER — IPRATROPIUM-ALBUTEROL 0.5-2.5 (3) MG/3ML IN SOLN
3.0000 mL | Freq: Three times a day (TID) | RESPIRATORY_TRACT | Status: DC
  Administered 2018-05-03 – 2018-05-04 (×4): 3 mL via RESPIRATORY_TRACT
  Filled 2018-05-02 (×4): qty 3

## 2018-05-02 MED ORDER — POTASSIUM CHLORIDE 20 MEQ/15ML (10%) PO SOLN
20.0000 meq | Freq: Once | ORAL | Status: AC
Start: 2018-05-02 — End: 2018-05-02
  Administered 2018-05-02: 20 meq via ORAL
  Filled 2018-05-02: qty 15

## 2018-05-02 MED ORDER — METOPROLOL TARTRATE 100 MG PO TABS
100.0000 mg | ORAL_TABLET | Freq: Two times a day (BID) | ORAL | Status: DC
  Administered 2018-05-02 – 2018-05-05 (×6): 100 mg via ORAL
  Filled 2018-05-02 (×7): qty 1

## 2018-05-02 MED ORDER — CLONIDINE HCL 0.1 MG PO TABS
0.1000 mg | ORAL_TABLET | Freq: Two times a day (BID) | ORAL | Status: DC
  Administered 2018-05-02 – 2018-05-03 (×2): 0.1 mg via ORAL
  Filled 2018-05-02 (×2): qty 1

## 2018-05-02 MED ORDER — RENA-VITE PO TABS
1.0000 | ORAL_TABLET | Freq: Every day | ORAL | Status: DC
  Administered 2018-05-02 – 2018-05-04 (×3): 1 via ORAL
  Filled 2018-05-02 (×5): qty 1

## 2018-05-02 MED ORDER — AMLODIPINE BESYLATE 10 MG PO TABS
10.0000 mg | ORAL_TABLET | Freq: Every day | ORAL | Status: DC
  Administered 2018-05-02 – 2018-05-04 (×3): 10 mg via ORAL
  Filled 2018-05-02 (×2): qty 1

## 2018-05-02 MED ORDER — AMLODIPINE BESYLATE 10 MG PO TABS
10.0000 mg | ORAL_TABLET | Freq: Every day | ORAL | Status: DC
  Filled 2018-05-02: qty 1

## 2018-05-02 MED ORDER — HYDRALAZINE HCL 50 MG PO TABS
100.0000 mg | ORAL_TABLET | Freq: Three times a day (TID) | ORAL | Status: DC
  Administered 2018-05-02 – 2018-05-05 (×10): 100 mg via ORAL
  Filled 2018-05-02 (×13): qty 2
  Filled 2018-05-02: qty 1

## 2018-05-02 MED ORDER — PANTOPRAZOLE SODIUM 40 MG PO TBEC
40.0000 mg | DELAYED_RELEASE_TABLET | Freq: Every day | ORAL | Status: DC
  Administered 2018-05-03 – 2018-05-05 (×3): 40 mg via ORAL
  Filled 2018-05-02 (×3): qty 1

## 2018-05-02 MED ORDER — LEVETIRACETAM 250 MG PO TABS
250.0000 mg | ORAL_TABLET | Freq: Two times a day (BID) | ORAL | Status: DC
  Administered 2018-05-02 – 2018-05-05 (×6): 250 mg via ORAL
  Filled 2018-05-02 (×8): qty 1

## 2018-05-02 MED ORDER — ISOSORBIDE MONONITRATE ER 30 MG PO TB24
30.0000 mg | ORAL_TABLET | Freq: Every day | ORAL | Status: DC
  Administered 2018-05-03 – 2018-05-05 (×3): 30 mg via ORAL
  Filled 2018-05-02 (×3): qty 1

## 2018-05-02 MED ORDER — DARBEPOETIN ALFA 150 MCG/0.3ML IJ SOSY: 150.0000 ug | PREFILLED_SYRINGE | INTRAMUSCULAR | Status: DC

## 2018-05-02 NOTE — Progress Notes (Signed)
Subjective: Interval History: sedation held, more awake Objective: Vital signs in last 24 hours: Temp:  [97.3 F (36.3 C)-100 F (37.8 C)] 99.7 F (37.6 C) (06/06 0700) Pulse Rate:  [77-112] 100 (06/06 0700) Resp:  [12-24] 17 (06/06 0700) BP: (122-219)/(53-110) 165/82 (06/06 0700) SpO2:  [98 %-100 %] 99 % (06/06 0700) FiO2 (%):  [30 %-40 %] 30 % (06/06 0400) Weight:  [73.2 kg (161 lb 6 oz)-76 kg (167 lb 8.8 oz)] 74.4 kg (164 lb 0.4 oz) (06/06 0445) Weight change: -2.2 kg (-4 lb 13.6 oz)  Intake/Output from previous day: 06/05 0701 - 06/06 0700 In: 3074.4 [I.V.:1450.5; NG/GT:1463.9; IV Piggyback:150] Out: 3240 [Urine:140] Intake/Output this shift: No intake/output data recorded.  General appearance: cooperative, no distress and slowed mentation Resp: clear to auscultation bilaterally Cardio: S1, S2 normal and systolic murmur: systolic ejection 2/6, decrescendo at 2nd left intercostal space GI: soft, non-tender; bowel sounds normal; no masses,  no organomegaly Extremities: AVF LUA  On vent but coop , lethargic  Lab Results: Recent Labs    05/01/18 0546 05/02/18 0455  WBC 8.3 8.2  HGB 9.6* 9.8*  HCT 28.5* 30.0*  PLT 159 177   BMET:  Recent Labs    05/01/18 0544 05/02/18 0455  NA 133* 135  K 4.1 3.4*  CL 97* 98*  CO2 24 26  GLUCOSE 162* 359*  BUN 62* 33*  CREATININE 10.69* 5.91*  CALCIUM 8.1* 8.1*   Recent Labs    04/30/18 1046  PTH 192*   Iron Studies:  Recent Labs    04/30/18 1046  IRON 94  TIBC 154*    Studies/Results: Dg Chest Port 1 View  Result Date: 05/01/2018 CLINICAL DATA:  Hypoxia EXAM: PORTABLE CHEST 1 VIEW COMPARISON:  April 30, 2018 common April 29, 2018, and March 14, 2017 FINDINGS: Endotracheal tube tip is 3.0 cm above the carina. Central catheter tip is at the cavoatrial junction. Nasogastric tube tip and side port are in the stomach. No pneumothorax. There is persistent atelectatic change in the left lower lung zone. Lungs elsewhere are  clear. Heart size and pulmonary vascularity are normal. No adenopathy. No bone lesions. IMPRESSION: Persistent left lower lobe atelectatic change. A small focus of superimposed pneumonia cannot be entirely excluded in this area. Lungs elsewhere are clear. Heart size normal. Tube and catheter positions as described without pneumothorax. Electronically Signed   By: Lowella Grip III M.D.   On: 05/01/2018 08:19   Dg Chest Port 1 View  Result Date: 04/30/2018 CLINICAL DATA:  62 year old male with central line placement. Subsequent encounter. EXAM: PORTABLE CHEST 1 VIEW COMPARISON:  04/30/2018 5 a.m. FINDINGS: Endotracheal tube tip 4.3 cm above the carina. Right internal jugular catheter placed with tip cavoatrial junction level. Nasogastric tube curled within the stomach tip not imaged on present exam. Bandlike atelectasis left infrahilar region. No infiltrate, congestive heart failure or pneumothorax. Heart size within normal limits. IMPRESSION: Right central line tip caval atrial junction level. No pneumothorax. Bandlike atelectasis left infrahilar region. Interval improved aeration left lung base. Electronically Signed   By: Genia Del M.D.   On: 04/30/2018 20:09    I have reviewed the patient's current medications.  Assessment/Plan: 1 ESRD HD yest, still vol xs. Lower at HD 2 Anemia add esa, Fe ok 3 HTN lower vol and meds 4 VDRF per CCM 5 DM controlled 6 NONADHERENCE, not on PD any more P HD, esa, ?D/C AB, wean,      LOS: 3 days   Jared [name]  05/02/2018,7:47 AM

## 2018-05-02 NOTE — Progress Notes (Addendum)
PULMONARY / CRITICAL CARE MEDICINE   Name: Jared [name] MRN: [id number] DOB: September 03, 1956    ADMISSION DATE:  04/29/2018 CONSULTATION DATE:  04/29/2018  REFERRING MD:  Dr. Roxanne Mins   CHIEF COMPLAINT:  Hyperglycemia/Seziures   HISTORY OF PRESENT ILLNESS:   62 year old male with PMH of Anemia, ESRD on PD, DM, GERD, HTN, CVA, Hypothyroidism, Seizures. Presents to ED on 6/3 with leftward gaze deviation, right-side jerking, aphasia, and AMS. Wife states he was in usual state of health. Out all day sightseeing, skipped PD. Early this AM found to be restless and grunting. Per EMS patient noted to be 2 seizures on route to ED, given total 5 mg versed. Code Stroke Called. CT Head Negative. Glucose 912. WBC 4.5. Neurology consulted. Suspected seizure due to hyperglycemia. Upon evaluation in ED patient not protecting airway.  SUBJECTIVE: Back on nicardipine. Tolerated HD well yesterday. Weaning well.   VITAL SIGNS: BP (!) 157/73   Pulse (!) 101   Temp 99.5 F (37.5 C)   Resp (!) 26   Ht 6\' 1"  (1.854 m)   Wt 74.4 kg (164 lb 0.4 oz)   SpO2 99%   BMI 21.64 kg/m   HEMODYNAMICS: CVP:  [10 mmHg] 10 mmHg  VENTILATOR SETTINGS: Vent Mode: CPAP;PSV FiO2 (%):  [30 %-40 %] 30 % Set Rate:  [15 bmp] 15 bmp Vt Set:  [640 mL] 640 mL PEEP:  [5 cmH20] 5 cmH20 Pressure Support:  [5 cmH20] 5 cmH20 Plateau Pressure:  [13 cmH20-17 cmH20] 17 cmH20  INTAKE / OUTPUT: I/O last 3 completed shifts: In: 3643.7 [I.V.:1854.8; Other:10; NG/GT:1528.9; IV Piggyback:250] Out: 3419 [Urine:270; Other:3100]  PHYSICAL EXAMINATION: General: frail elderly appearing male on vent.  Neuro: Follows  HEENT: Endotracheal tube in place, oropharynx dry Cardiovascular: Regular, no murmur, tachycardia is improved Lungs: Coarse bilateral breath sounds Abdomen: Mild distention, hypoactive bowel sounds, peritoneal catheter in place Musculoskeletal: Trace pretibial edema Skin: Left upper extremity AV fistula, good  thrill  LABS:  BMET Recent Labs  Lab 04/30/18 0451 05/01/18 0544 05/02/18 0455  NA 134* 133* 135  K 3.8 4.1 3.4*  CL 96* 97* 98*  CO2 21* 24 26  BUN 57* 62* 33*  CREATININE 9.61* 10.69* 5.91*  GLUCOSE 223* 162* 359*    Electrolytes Recent Labs  Lab 04/30/18 0451 05/01/18 0544 05/01/18 1158 05/01/18 2159 05/02/18 0455  CALCIUM 8.7* 8.1*  --   --  8.1*  MG 1.4*  --  1.5* 2.2 2.2  PHOS 3.0 4.3 5.5* 3.6 1.7*  1.7*    CBC Recent Labs  Lab 04/30/18 0451 05/01/18 0546 05/02/18 0455  WBC 7.5 8.3 8.2  HGB 10.3* 9.6* 9.8*  HCT 30.2* 28.5* 30.0*  PLT 149* 159 177    Coag's Recent Labs  Lab 04/29/18 0339  APTT 26  INR 0.98    Sepsis Markers Recent Labs  Lab 04/29/18 0837 04/29/18 1137 04/29/18 2154 04/30/18 1046 05/01/18 0544  LATICACIDVEN 5.2* 5.8* 3.3*  --   --   PROCALCITON 1.12  --   --  2.83 3.17    ABG Recent Labs  Lab 04/29/18 0603 04/29/18 0731  PHART 7.344* 7.365  PCO2ART 47.5 39.5  PO2ART 307.0* 134.0*    Liver Enzymes Recent Labs  Lab 04/29/18 0339 05/01/18 0544 05/02/18 0455  AST 28  --   --   ALT 19  --   --   ALKPHOS 60  --   --   BILITOT 0.9  --   --   ALBUMIN  3.2* 2.0* 2.2*    Cardiac Enzymes Recent Labs  Lab 04/29/18 0837 04/29/18 1358 04/29/18 1900  TROPONINI <0.03 0.03* 0.04*    Glucose Recent Labs  Lab 05/01/18 1133 05/01/18 1508 05/01/18 2010 05/01/18 2356 05/02/18 0407 05/02/18 0737  GLUCAP 155* 134* 285* 333* 315* 308*    Imaging No results found.   STUDIES:  CT Head 6/03 > 1. No acute intracranial process on this motion degraded examination. Mild parenchymal brain volume loss for age. 6/3 EEG > This EEG is abnormal due to moderate diffuse suppression and background slowing. There were no epileptiform discharges or electrographic seizures seen  CULTURES: Blood 6/3 >> Tracheal Asp 6/3 >> Peritoneal fluid 6/3 >>   ANTIBIOTICS: Aztreonam 6/3  >> 6/3 cefepime 6/3 >>  Vanco 6/3 >>  6/5  SIGNIFICANT EVENTS: 6/3 > Presents to ED   LINES/TUBES: ETT 6/3 >>  DISCUSSION: 62 year old male presents post-ictal and hyperglycemic requiring intubation for airway protection.  There was some concern for aspiration event and he was treated with broad antibiotics. EEG did not describe fever. Course complicated by ongoing encephalopathy and failure to wean. Refractory hypertension. Started HD 6/6 and respiratory/mental status improved. Weaning well 6/6 and extubated. Refractory HTN remained an issue.  ASSESSMENT / PLAN:  Respiratory Insufficieny in setting of encephalopathy  Possible aspiration pneumonia P:   Weaning well, will extubate. Antibiotics as below   HTN  P:  Continue home metoprolol, clonidine, amlodipine Will add back scheduled hydralazine and imdur today.  Need to get off nicardipine. HD again today should help.  Goal systolic now < [id number]  ESRD on PD.  Hypomagnesemia   Acute anion gap metabolic acidosis, lactic acidosis; improved P:   Appreciate nephrology assistance HD again today, tx #2 Continue to follow BMP KVO  GERD P:   SLP eval Continue PPI as ordered   Anemia of Chronic Disease  Thrombocytopenia - on previous admission 153, stable P:  Follow CBC, no current evidence of any bleeding Heparin for DVT prophylaxis Transfusion goal Hgb > 7  Aspiration Event, possible aspiration pneumonia Severe sepsis, occult septic shock (elevated lactate); at risk for peritonitis given his indwelling catheter, peritoneal dialysis P:   Continue cefepime.  Trend WBC and fever curve  Severe hyperglycemia, HONC P:   SSI  Encephalopathy in setting of post-ictal state: improved  No clear evidence for continued seizures, no evidence for a reciprocating cause on MRI brain.  Suspect that this is all toxic metabolic in the setting of hyperglycemia, hypertension. Seizure in setting of hyperglycemiaH/O Seizures   P:   Monitor Continue Keppra as  ordered  FAMILY  - Updates: No family here currently, they will be here alter today and will update then.   - Inter-disciplinary family meet or Palliative Care meeting due by: 05/06/2018  Georgann Housekeeper, AGACNP-BC [facility] Pager [phone] or [phone]  05/02/2018 9:26 AM

## 2018-05-02 NOTE — Procedures (Addendum)
Extubation Procedure Note  Patient Details:   Name: Jared [name] DOB: May 24, 1956 MRN: [id number]   Airway Documentation:    Vent end date: 05/02/18 Vent end time: 0939   Evaluation  O2 sats: stable throughout Complications: No apparent complications Patient did tolerate procedure well. Bilateral Breath Sounds: Clear, Diminished   Yes pt able to vocalize.   Pt extubated at this time and able to breathe around deflated cuff. Pt placed on 4L [place] and SPo2 100%. Pt has strong, adequate cough. No stridor noted.  Attempted IS but unable to perform due to mental status.    [name] [facility] 05/02/2018, 9:45 AM

## 2018-05-02 NOTE — Progress Notes (Addendum)
NEURO HOSPITALIST PROGRESS NOTE   Subjective: Per RN they will try to extubate later today.  Exam: Vitals:   05/02/18 0730 05/02/18 0800  BP: (!) 143/71 (!) 144/68  Pulse: 98 92  Resp: 12 15  Temp: 99.7 F (37.6 C) 99.5 F (37.5 C)  SpO2: 99% 99%    Physical Exam   HEENT-  Normocephalic, no lesions, without obvious abnormality.  Normal external eye and conjunctiva.   Cardiovascular-  pulses palpable throughout   Lungs-, no excessive working breathing.  Saturations within normal limits. Breathing above the vent. Extremities- Warm, dry and intact, edematous Musculoskeletal-edema noted Skin-warm and dry, no hyperpigmentation, vitiligo, or suspicious lesions  Neuro:  Mental Status: Alert,  Intubated, not sedated. Able to respond to simple commands and name. Patient able to give me a thumbs up on both sides. He does exhibit a delayed response on the left side compared to the right. Breathing above the vent. Able to track examiner around the room. Able to move all extremities on command, but takes him longer to move the left side than it does to move the right side. Cranial Nerves: Patient able to track examiner around the room, responds when you call his name. Ptosis not present,  pupils equal, round, reactive to light and accommodation. Per RN cough and gag intact. Motor: Patient able to move all extremities spontaneously, and on command. Patient can not yet break gravity with upper extremities, but did raise bilaterally lower extremities. Patient able to squeeze hands on command. Again, left grip is slower than right.  Tone and bulk:normal tone throughout; no atrophy noted Sensory: Patient able to localize and withdraw lower extremities, but only grimaces to noxious stimuli in bilateral upper extremities.  Deep Tendon Reflexes: 2+ and symmetric throughout Plantars: Right: downgoing   Left: downgoing Cerebellar: Unable to assess Gait: unable to assess     Medications:  Scheduled: . amLODipine  10 mg Per Tube QHS  . chlorhexidine gluconate (MEDLINE KIT)  15 mL Mouth Rinse BID  . Chlorhexidine Gluconate Cloth  6 each Topical Q0600  . cloNIDine  0.1 mg Per Tube BID  . [START ON 05/03/2018] darbepoetin (ARANESP) injection - DIALYSIS  150 mcg Intravenous Q Fri-HD  . heparin  5,000 Units Subcutaneous Q8H  . insulin aspart  0-15 Units Subcutaneous Q4H  . ipratropium-albuterol  3 mL Nebulization Q6H  . levETIRAcetam  250 mg Per Tube BID  . mouth rinse  15 mL Mouth Rinse 10 times per day  . metoprolol tartrate  100 mg Per Tube BID  . multivitamin  1 tablet Oral QHS  . pantoprazole sodium  40 mg Per Tube Daily   Continuous: . sodium chloride 10 mL/hr at 05/02/18 0600  . sodium chloride    . sodium chloride    . ceFEPime (MAXIPIME) IV Stopped (05/01/18 1849)  . dialysis solution 4.25% low-MG/low-CA    . feeding supplement (VITAL AF 1.2 CAL) 65 mL/hr at 05/02/18 0600  . magnesium sulfate 1 - 4 g bolus IVPB    . niCARDipine 7.5 mg/hr (05/02/18 0804)   KDX:IPJASN chloride, sodium chloride, sodium chloride, alteplase, fentaNYL (SUBLIMAZE) injection, fentaNYL (SUBLIMAZE) injection, dianeal solution for CAPD/CCPD with heparin, heparin, heparin, hydrALAZINE, lidocaine (PF), lidocaine-prilocaine, midazolam, midazolam, pentafluoroprop-tetrafluoroeth  Pertinent Labs/Diagnostics:   Dg Chest Port 1 View  Result Date: 05/01/2018 CLINICAL DATA:  Hypoxia EXAM: PORTABLE CHEST 1 VIEW COMPARISON:  April 30, 2018 common April 29, 2018, and March 14, 2017 FINDINGS: Endotracheal tube tip is 3.0 cm above the carina. Central catheter tip is at the cavoatrial junction. Nasogastric tube tip and side port are in the stomach. No pneumothorax. There is persistent atelectatic change in the left lower lung zone. Lungs elsewhere are clear. Heart size and pulmonary vascularity are normal. No adenopathy. No bone lesions. IMPRESSION: Persistent left lower lobe atelectatic change. A  small focus of superimposed pneumonia cannot be entirely excluded in this area. Lungs elsewhere are clear. Heart size normal. Tube and catheter positions as described without pneumothorax. Electronically Signed   By: Lowella Grip III M.D.   On: 05/01/2018 08:19   Dg Chest Port 1 View  Result Date: 04/30/2018 CLINICAL DATA:  62 year old male with central line placement. Subsequent encounter. EXAM: PORTABLE CHEST 1 VIEW COMPARISON:  04/30/2018 5 a.m. FINDINGS: Endotracheal tube tip 4.3 cm above the carina. Right internal jugular catheter placed with tip cavoatrial junction level. Nasogastric tube curled within the stomach tip not imaged on present exam. Bandlike atelectasis left infrahilar region. No infiltrate, congestive heart failure or pneumothorax. Heart size within normal limits. IMPRESSION: Right central line tip caval atrial junction level. No pneumothorax. Bandlike atelectasis left infrahilar region. Interval improved aeration left lung base. Electronically Signed   By: Genia Del M.D.   On: 04/30/2018 20:09    Impression: 62 year old male with ESRD, DM and HTN, prior history of provoked seizures, presents with seizure activity in the setting of HTN and severe hyperglycemia with blood sugars in 900s. Had postictal Todd's paralysis on initial neurological evaluation.  1. Blood sugars normalizing. Recent blood glucose from 1144 today was 197.  2. No longer sedated 3. No evidence for seizure activity on examination. 4. Significantly improved. Expect that he will be able to tolerate extubation and participate in PT/OT.   Recommendations:  1. Continue Keppra renal dosing at 250 mg BID 2. Neurology will sign off. Please call if there are additional questions.    Laurey Morale, MSN, NP-C [facility] [phone]  Plan discussed with Neurology team.  Electronically signed: Dr. Kerney Elbe 05/02/2018, 8:33 AM

## 2018-05-02 NOTE — Progress Notes (Signed)
Pharmacy Antibiotic Note  Jared [name] is a 62 y.o. male admitted on 04/29/2018 with sepsis.  Day #4 cefepime, all cultures remain ngtd. Patient was transitioned to HD yesterday, received first HD treatment at BFR 350 ml/hr.  Plan: -Cefepime 1g/24h -Monitor cultures, LOT -Monitor HD schedule and tolerance    Height: 6\' 1"  (185.4 cm) Weight: 164 lb 0.4 oz (74.4 kg) IBW/kg (Calculated) : 79.9  Temp (24hrs), Avg:99 F (37.2 C), Min:97.3 F (36.3 C), Max:100 F (37.8 C)  Recent Labs  Lab 04/29/18 0339 04/29/18 0837 04/29/18 1137  04/29/18 1900 04/29/18 2154 04/30/18 0047 04/30/18 0451 05/01/18 0544 05/01/18 0545 05/01/18 0546 05/02/18 0455  WBC 4.5  --   --   --   --   --   --  7.5  --   --  8.3 8.2  CREATININE 10.06* 10.14*  --    < > 10.23*  --  9.93* 9.61* 10.69*  --   --  5.91*  LATICACIDVEN  --  5.2* 5.8*  --   --  3.3*  --   --   --   --   --   --   VANCORANDOM  --   --   --   --   --   --   --   --   --  17  --   --    < > = values in this interval not displayed.    Estimated Creatinine Clearance: 13.3 mL/min (A) (by C-G formula based on SCr of 5.91 mg/dL (H)).     Harvel Quale 05/02/2018 10:18 AM

## 2018-05-03 DIAGNOSIS — N2581 Secondary hyperparathyroidism of renal origin: Secondary | ICD-10-CM | POA: Diagnosis not present

## 2018-05-03 DIAGNOSIS — N186 End stage renal disease: Secondary | ICD-10-CM | POA: Diagnosis not present

## 2018-05-03 LAB — RENAL FUNCTION PANEL
ALBUMIN: 2.2 g/dL — AB (ref 3.5–5.0)
Anion gap: 12 (ref 5–15)
BUN: 41 mg/dL — ABNORMAL HIGH (ref 6–20)
CALCIUM: 8.4 mg/dL — AB (ref 8.9–10.3)
CO2: 25 mmol/L (ref 22–32)
CREATININE: 6.96 mg/dL — AB (ref 0.61–1.24)
Chloride: 102 mmol/L (ref 101–111)
GFR calc Af Amer: 9 mL/min — ABNORMAL LOW (ref >60)
GFR, EST NON AFRICAN AMERICAN: 7 mL/min — AB (ref >60)
Glucose, Bld: 186 mg/dL — ABNORMAL HIGH (ref 65–99)
PHOSPHORUS: 3.8 mg/dL (ref 2.5–4.6)
Potassium: 3.5 mmol/L (ref 3.5–5.1)
SODIUM: 139 mmol/L (ref 135–145)

## 2018-05-03 LAB — CBC
HCT: 28.6 % — ABNORMAL LOW (ref 39.0–52.0)
Hemoglobin: 9.4 g/dL — ABNORMAL LOW (ref 13.0–17.0)
MCH: 26.7 pg (ref 26.0–34.0)
MCHC: 32.9 g/dL (ref 30.0–36.0)
MCV: 81.3 fL (ref 78.0–100.0)
PLATELETS: 202 10*3/uL (ref 150–400)
RBC: 3.52 MIL/uL — ABNORMAL LOW (ref 4.22–5.81)
RDW: 15.5 % (ref 11.5–15.5)
WBC: 9.4 10*3/uL (ref 4.0–10.5)

## 2018-05-03 LAB — GLUCOSE, CAPILLARY
GLUCOSE-CAPILLARY: 209 mg/dL — AB (ref 65–99)
GLUCOSE-CAPILLARY: 136 mg/dL — AB (ref 65–99)
Glucose-Capillary: 102 mg/dL — ABNORMAL HIGH (ref 65–99)
Glucose-Capillary: 106 mg/dL — ABNORMAL HIGH (ref 65–99)
Glucose-Capillary: 173 mg/dL — ABNORMAL HIGH (ref 65–99)
Glucose-Capillary: 247 mg/dL — ABNORMAL HIGH (ref 65–99)

## 2018-05-03 MED ORDER — CLONIDINE HCL 0.2 MG PO TABS
0.3000 mg | ORAL_TABLET | Freq: Three times a day (TID) | ORAL | Status: DC
  Administered 2018-05-03 – 2018-05-05 (×7): 0.3 mg via ORAL
  Filled 2018-05-03 (×8): qty 1

## 2018-05-03 MED ORDER — SODIUM CHLORIDE 0.9 % IV SOLN: 100.0000 mL | INTRAVENOUS | Status: DC | PRN

## 2018-05-03 MED ORDER — NEPRO/CARBSTEADY PO LIQD
237.0000 mL | Freq: Two times a day (BID) | ORAL | Status: DC
  Administered 2018-05-04: 237 mL via ORAL
  Filled 2018-05-03 (×5): qty 237

## 2018-05-03 MED ORDER — HEPARIN SODIUM (PORCINE) 1000 UNIT/ML DIALYSIS
1000.0000 [IU] | INTRAMUSCULAR | Status: DC | PRN
  Filled 2018-05-03 (×2): qty 1

## 2018-05-03 MED ORDER — ATORVASTATIN CALCIUM 20 MG PO TABS
20.0000 mg | ORAL_TABLET | Freq: Every day | ORAL | Status: DC
  Administered 2018-05-03 – 2018-05-04 (×2): 20 mg via ORAL
  Filled 2018-05-03 (×3): qty 1

## 2018-05-03 MED ORDER — PENTAFLUOROPROP-TETRAFLUOROETH EX AERO: 1.0000 "application " | INHALATION_SPRAY | CUTANEOUS | Status: DC | PRN

## 2018-05-03 MED ORDER — LIDOCAINE HCL (PF) 1 % IJ SOLN
5.0000 mL | INTRAMUSCULAR | Status: DC | PRN
  Filled 2018-05-03 (×2): qty 5

## 2018-05-03 MED ORDER — LIDOCAINE-PRILOCAINE 2.5-2.5 % EX CREA: 1.0000 "application " | TOPICAL_CREAM | CUTANEOUS | Status: DC | PRN

## 2018-05-03 MED ORDER — HEPARIN SODIUM (PORCINE) 1000 UNIT/ML DIALYSIS
100.0000 [IU]/kg | INTRAMUSCULAR | Status: DC | PRN
  Filled 2018-05-03: qty 8

## 2018-05-03 MED ORDER — ALTEPLASE 2 MG IJ SOLR
2.0000 mg | Freq: Once | INTRAMUSCULAR | Status: DC | PRN
  Filled 2018-05-03: qty 2

## 2018-05-03 MED ORDER — CALCITRIOL 0.25 MCG PO CAPS
0.2500 ug | ORAL_CAPSULE | Freq: Every day | ORAL | Status: DC
  Administered 2018-05-03 – 2018-05-05 (×3): 0.25 ug via ORAL
  Filled 2018-05-03 (×3): qty 1

## 2018-05-03 MED ORDER — SEVELAMER CARBONATE 800 MG PO TABS
2400.0000 mg | ORAL_TABLET | Freq: Three times a day (TID) | ORAL | Status: DC
  Administered 2018-05-03 – 2018-05-05 (×6): 2400 mg via ORAL
  Filled 2018-05-03 (×6): qty 3

## 2018-05-03 MED ORDER — CINACALCET HCL 30 MG PO TABS
30.0000 mg | ORAL_TABLET | Freq: Every day | ORAL | Status: DC
  Administered 2018-05-03 – 2018-05-05 (×3): 30 mg via ORAL
  Filled 2018-05-03 (×3): qty 1

## 2018-05-03 NOTE — Care Management Note (Addendum)
Case Management Note  Patient Details  Name: Jared [name] MRN: [id number] Date of Birth: [birth year]  Subjective/Objective:    Pt admitted with hyperglycemic seizures                  Action/Plan:  PTA independent from home with sister, pt stated he did not assistive devices with mobility.  Pt was on PD and just during this admit pt may have to transition to HD - if this is the case pt will need clipping.  On last admit pt was active with [facility] - pt informed CM that he is no longer receiving services.  CM will continue to follow for discharge needs   Expected Discharge Date:                  Expected Discharge Plan:     In-House Referral:  Clinical Social Work  Discharge planning Services  CM Consult  Post Acute Care Choice:    Choice offered to:     DME Arranged:    DME Agency:     HH Arranged:    [place] Agency:     Status of Service:     If discussed at [employer], dates discussed:    Additional Comments:  Maryclare Labrador, RN 05/03/2018, 3:43 PM

## 2018-05-03 NOTE — Progress Notes (Signed)
Nutrition Follow-up  DOCUMENTATION CODES:   Not applicable  INTERVENTION:    Nepro Shake po BID, each supplement provides 425 kcal and 19 grams protein  NUTRITION DIAGNOSIS:   Inadequate oral intake related to dysphagia as evidenced by meal completion < 50%.  Ongoing  GOAL:   Patient will meet greater than or equal to 90% of their needs  Unmet  MONITOR:   PO intake, Supplement acceptance, Labs  ASSESSMENT:   62 yo male with PMH of ESRD on PD, HTN, anemia, thyroid DZ, DM, stroke, GERD, and seizures who was admitted on 6/3 with hyperglycemia and seizures, requiring intubation for airway protection.  Discussed patient with RN today. Extubated 6/6. He is receiving HD in his room today. Consumed 50% of clear liquid breakfast this morning. SLP following, diet advanced to dysphagia 2 with thin liquids today. Suspect intake will be suboptimal, given acute illness, lethargy, and recent intubation. Will add PO supplement to maximize oral intake. Labs reviewed. Potassium & phosphorus are WNL. CBG's: [phone] Medications reviewed and include Lopressor, Rena-vit, Novolog.   Diet Order:   Diet Order           DIET DYS 2 Room service appropriate? Yes; Fluid consistency: Thin  Diet effective now          EDUCATION NEEDS:   No education needs have been identified at this time  Skin:  Skin Assessment: Reviewed RN Assessment  Last BM:  6/6  Height:   Ht Readings from Last 1 Encounters:  04/29/18 6\' 1"  (1.854 m)    Weight:   Wt Readings from Last 1 Encounters:  05/03/18 168 lb 3.4 oz (76.3 kg)    Ideal Body Weight:  83.6 kg  BMI:  Body mass index is 22.19 kg/m.  Estimated Nutritional Needs:   Kcal:  2200-2500  Protein:  90-105 gm  Fluid:  1.2 L   Molli Barrows, RD, LDN, CNSC Pager [phone] After Hours Pager [phone]

## 2018-05-03 NOTE — Evaluation (Signed)
Clinical/Bedside Swallow Evaluation Patient Details  Name: Jared [name] MRN: [id number] Date of Birth: 1956/04/08  Today's Date: 05/03/2018 Time: SLP Start Time (ACUTE ONLY): 0626 SLP Stop Time (ACUTE ONLY): 1012 SLP Time Calculation (min) (ACUTE ONLY): 14 min  Past Medical History:  Past Medical History:  Diagnosis Date  . Anemia   . Cancer [facility])    early prostate cancer per patient  . Chronic kidney disease    on dialysis M-W-F  . Diabetes mellitus without complication ([place])    onset as adult  . GERD (gastroesophageal reflux disease)   . Hypertension   . Seizures ([place])    pt states he thinks this is bc of his blood sugar  . Stroke [facility])    patient denies  . Thyroid disease    hyperparathyroidism   Past Surgical History:  Past Surgical History:  Procedure Laterality Date  . AV FISTULA PLACEMENT Left 06/04/2014   Procedure: ARTERIOVENOUS (AV) FISTULA CREATION-  BRACHIOCEPHALIC WITH LIGATION OF COMPETEING BRANCH;  Surgeon: Mal Misty, MD;  Location: [place];  Service: Vascular;  Laterality: Left;  . INGUINAL HERNIA REPAIR Left 02/19/2018   Procedure: LAPAROSCOPIC LEFT  INGUINAL HERNIA;  Surgeon: Ralene Ok, MD;  Location: [place];  Service: General;  Laterality: Left;  . INSERTION OF DIALYSIS CATHETER  04/2014  . INSERTION OF MESH Left 02/19/2018   Procedure: INSERTION OF MESH;  Surgeon: Ralene Ok, MD;  Location: [place];  Service: General;  Laterality: Left;  . IR GENERIC HISTORICAL Left 11/30/2016   IR THROMBECTOMY AV FISTULA W/THROMBOLYSIS/PTA/STENT INC/SHUNT/IMG LT 11/30/2016 Markus Daft, MD [facility]-INTERV RAD  . IR GENERIC HISTORICAL  11/30/2016   IR US GUIDE VASC ACCESS LEFT 11/30/2016 Markus Daft, MD [facility]INTER[facility] RAD   HPI:  62 year old male with PMH of Anemia, ESRD on PD, DM, GERD, HTN, CVA, Hypothyroidism, Seizures. Presents to ED on 6/3 with leftward gaze deviation, right-side jerking, aphasia, and AMS. ETT 6/3-6/6. Pt was previously seen by SLP for swallowing in April 2018 with  recommendations initially for full liquid diet due to mentation, but with advancement to Dys 3 diet and thin liquids while on CIR.   Assessment / Plan / Recommendation Clinical Impression  Pt has no overt signs of aspiration and his vocal quality is clear, although his mentation is slowed as is his oral preparation with soft solids. Mild-mod residue noted, requiring Min cues from SLP for use of liquid wash to clear. Pt reports always wearing his dentures at home for eating but they are not available today. Recommend starting Dys 2 diet for ease of mastication in setting of altered mentation. Would continue thin liquids. SLP will f/u for tolerance and potential to advance. SLP Visit Diagnosis: Dysphagia, unspecified (R13.10)    Aspiration Risk  Mild aspiration risk    Diet Recommendation Dysphagia 2 (Fine chop);Thin liquid   Liquid Administration via: Cup;Straw Medication Administration: Whole meds with puree Supervision: Patient able to self feed;Intermittent supervision to cue for compensatory strategies Compensations: Minimize environmental distractions;Slow rate;Small sips/bites Postural Changes: Seated upright at 90 degrees;Remain upright for at least 30 minutes after po intake    Other  Recommendations Oral Care Recommendations: Oral care BID   Follow up Recommendations 24 hour supervision/assistance      Frequency and Duration min 2x/week  2 weeks       Prognosis Prognosis for Safe Diet Advancement: Good Barriers to Reach Goals: Cognitive deficits      Swallow Study   General HPI: 62 year old male  with PMH of Anemia, ESRD on PD, DM, GERD, HTN, CVA, Hypothyroidism, Seizures. Presents to ED on 6/3 with leftward gaze deviation, right-side jerking, aphasia, and AMS. ETT 6/3-6/6. Pt was previously seen by SLP for swallowing in April 2018 with recommendations initially for full liquid diet due to mentation, but with advancement to Dys 3 diet and thin liquids while on CIR. Type of  Study: Bedside Swallow Evaluation Previous Swallow Assessment: see HPI Diet Prior to this Study: Thin liquids Temperature Spikes Noted: No Respiratory Status: Room air History of Recent Intubation: Yes Length of Intubations (days): 3 days Date extubated: 05/02/18 Behavior/Cognition: Alert;Cooperative;Other (Comment)(slow to respond) Oral Care Completed by SLP: No Oral Cavity - Dentition: Edentulous;Dentures, not available Vision: Functional for self-feeding Self-Feeding Abilities: Able to feed self Patient Positioning: Upright in bed Baseline Vocal Quality: Normal    Oral/Motor/Sensory Function     Ice Chips Ice chips: Not tested   Thin Liquid Thin Liquid: Within functional limits Presentation: Cup;Self Fed;Straw    Nectar Thick Nectar Thick Liquid: Not tested   Honey Thick Honey Thick Liquid: Not tested   Puree Puree: Within functional limits Presentation: Self Fed;Spoon   Solid   GO   Solid: Impaired Presentation: Self Fed Oral Phase Functional Implications: Oral holding;Impaired mastication        [name] 05/03/2018,10:24 AM  [name], M.A. CCC-SLP [phone]

## 2018-05-03 NOTE — Progress Notes (Signed)
[facility] Progress Note  Subjective: in bed no /co's today  Vitals:   05/03/18 0900 05/03/18 1000 05/03/18 1100 05/03/18 1200  BP: (!) 159/59 (!) 154/53 (!) 154/54 (!) 153/61  Pulse: 99 95 (!) 103 95  Resp: (!) 25 19 (!) 21 (!) 23  Temp: 99 F (37.2 C) 98.2 F (36.8 C) 97.7 F (36.5 C) 97.7 F (36.5 C)  TempSrc:      SpO2: 100% 91% 95% 96%  Weight:      Height:        Inpatient medications: . amLODipine  10 mg Oral QHS  . Chlorhexidine Gluconate Cloth  6 each Topical Q0600  . cloNIDine  0.1 mg Oral BID  . darbepoetin (ARANESP) injection - DIALYSIS  150 mcg Intravenous Q Fri-HD  . heparin  5,000 Units Subcutaneous Q8H  . hydrALAZINE  100 mg Oral TID  . insulin aspart  0-15 Units Subcutaneous Q4H  . ipratropium-albuterol  3 mL Nebulization TID  . isosorbide mononitrate  30 mg Oral Daily  . levETIRAcetam  250 mg Oral BID  . mouth rinse  15 mL Mouth Rinse BID  . metoprolol tartrate  100 mg Oral BID  . multivitamin  1 tablet Oral QHS  . pantoprazole  40 mg Oral Daily   . sodium chloride 10 mL/hr at 05/03/18 1200  . ceFEPime (MAXIPIME) IV Stopped (05/02/18 1829)  . niCARDipine 10 mg/hr (05/03/18 1200)   sodium chloride, fentaNYL (SUBLIMAZE) injection, fentaNYL (SUBLIMAZE) injection, hydrALAZINE, midazolam, midazolam  Exam: Alert no distress  no jvd  chest cta bilat  cor reg 2/6 sem no rg  abd soft obese PD cath ruq  ext 1+ edema  neuro alert and responds approp  lua avf+bruit/ PD in abd    cxr's - all clear since admit  Dialysis: [facility] (f/b Dr Lorrene Reid) - getting HD here using L arm AVF      Impression: 1 hypertension urgency - better on norvasc/ hydralazine/ clon/ metop, needs sig vol off 2 acute resp failure - in setting of HTN urgency and seizures 3 altered mental status - due to severe hyperglycemia and HTN urgency, much better 4 seizures - on keppra, neuro signed off 5 esrd on hd here [facility], last hd wed, holding pd for now  6 volume - no  excess on exam, wt's are stable from admit, cxr's all clear since admit   Plan - hd today in ICU max UF    Kelly Splinter MD [place] pager [phone]   05/03/2018, 12:37 PM   Recent Labs  Lab 05/01/18 0544  05/02/18 0455 05/02/18 1700 05/03/18 0416  NA 133*  --  135  --  139  K 4.1  --  3.4*  --  3.5  CL 97*  --  98*  --  102  CO2 24  --  26  --  25  GLUCOSE 162*  --  359*  --  186*  BUN 62*  --  33*  --  41*  CREATININE 10.69*  --  5.91*  --  6.96*  CALCIUM 8.1*  --  8.1*  --  8.4*  PHOS 4.3   < > 1.7*  1.7* 3.9 3.8   < > = values in this interval not displayed.   Recent Labs  Lab 04/29/18 0339 05/01/18 0544 05/02/18 0455 05/03/18 0416  AST 28  --   --   --   ALT 19  --   --   --  ALKPHOS 60  --   --   --   BILITOT 0.9  --   --   --   PROT 6.4*  --   --   --   ALBUMIN 3.2* 2.0* 2.2* 2.2*   Recent Labs  Lab 04/29/18 0339  05/01/18 0546 05/02/18 0455 05/03/18 0416  WBC 4.5   < > 8.3 8.2 9.4  NEUTROABS 2.2  --   --   --   --   HGB 11.7*   < > 9.6* 9.8* 9.4*  HCT 36.5*   < > 28.5* 30.0* 28.6*  MCV 83.3   < > 78.3 81.7 81.3  PLT 160   < > 159 177 202   < > = values in this interval not displayed.   Iron/TIBC/Ferritin/ %Sat    Component Value Date/Time   IRON 94 04/30/2018 1046   TIBC 154 (L) 04/30/2018 1046   IRONPCTSAT 61 (H) 04/30/2018 1046

## 2018-05-03 NOTE — Progress Notes (Signed)
PULMONARY / CRITICAL CARE MEDICINE   Name: Jared [name] MRN: [id number] DOB: Dec 26, 1955    ADMISSION DATE:  04/29/2018 CONSULTATION DATE:  04/29/2018  REFERRING MD:  Dr. Roxanne Mins   CHIEF COMPLAINT:  Hyperglycemia/Seziures   HISTORY OF PRESENT ILLNESS:   62 year old male with PMH of Anemia, ESRD on PD, DM, GERD, HTN, CVA, Hypothyroidism, Seizures. Presents to ED on 6/3 with leftward gaze deviation, right-side jerking, aphasia, and AMS. Sister states he was in usual state of health. Out all day sightseeing, skipped PD. Early AM 6/3 found to be restless and grunting. Per EMS patient noted to be 2 seizures on route to ED, given total 5 mg versed. Code Stroke Called. CT Head Negative. Glucose 912. WBC 4.5. Neurology consulted. Suspected seizure due to hyperglycemia. Upon evaluation in ED patient not protecting airway.  Intubated.  Admitted to ICU for hypertension, hyperglycemia and AMS evaluation.  Required cardene gtt for HTN.  Extubated 6/6 without difficulty.  Per Renal, now transitioning to HD from PD.    SUBJECTIVE:  Pt denies complaints.  States he doesn't have much of an appetite.  Tolerating HD without complaint.  BP controlled on cardene gtt.    VITAL SIGNS: BP (!) 139/58   Pulse 82   Temp (!) 97.2 F (36.2 C)   Resp (!) 31   Ht 6\' 1"  (1.854 m)   Wt 168 lb 3.4 oz (76.3 kg)   SpO2 95%   BMI 22.19 kg/m   HEMODYNAMICS:    VENTILATOR SETTINGS: FiO2 (%):  [2 %] 2 %  INTAKE / OUTPUT: I/O last 3 completed shifts: In: 4892.6 [P.O.:380; I.V.:3035.8; Other:40; NG/GT:1066.8; IV Piggyback:370] Out: 109 [MPNTI:144]  PHYSICAL EXAMINATION: General: chronically ill appearing male in NAD, lying flat in bed / on HD HEENT: MM pink/moist, edentulous Neuro: Awake, alert, oriented to self, place.  Unable to state the year correctly.  Generalized weakness / MAE CV: s1s2 rrr, SEM 2/6 PULM: even/non-labored, lungs bilaterally clear  GI: soft, non-tender, bsx4 active  Extremities: warm/dry, no  edema.  LUE AVF > HD in progress. Skin: no rashes or lesions  LABS:  BMET Recent Labs  Lab 05/01/18 0544 05/02/18 0455 05/03/18 0416  NA 133* 135 139  K 4.1 3.4* 3.5  CL 97* 98* 102  CO2 24 26 25   BUN 62* 33* 41*  CREATININE 10.69* 5.91* 6.96*  GLUCOSE 162* 359* 186*    Electrolytes Recent Labs  Lab 05/01/18 0544  05/01/18 2159 05/02/18 0455 05/02/18 1700 05/03/18 0416  CALCIUM 8.1*  --   --  8.1*  --  8.4*  MG  --    < > 2.2 2.2 2.1  --   PHOS 4.3   < > 3.6 1.7*  1.7* 3.9 3.8   < > = values in this interval not displayed.    CBC Recent Labs  Lab 05/01/18 0546 05/02/18 0455 05/03/18 0416  WBC 8.3 8.2 9.4  HGB 9.6* 9.8* 9.4*  HCT 28.5* 30.0* 28.6*  PLT 159 177 202    Coag's Recent Labs  Lab 04/29/18 0339  APTT 26  INR 0.98    Sepsis Markers Recent Labs  Lab 04/29/18 0837 04/29/18 1137 04/29/18 2154 04/30/18 1046 05/01/18 0544  LATICACIDVEN 5.2* 5.8* 3.3*  --   --   PROCALCITON 1.12  --   --  2.83 3.17    ABG Recent Labs  Lab 04/29/18 0603 04/29/18 0731  PHART 7.344* 7.365  PCO2ART 47.5 39.5  PO2ART 307.0* 134.0*  Liver Enzymes Recent Labs  Lab 04/29/18 0339 05/01/18 0544 05/02/18 0455 05/03/18 0416  AST 28  --   --   --   ALT 19  --   --   --   ALKPHOS 60  --   --   --   BILITOT 0.9  --   --   --   ALBUMIN 3.2* 2.0* 2.2* 2.2*    Cardiac Enzymes Recent Labs  Lab 04/29/18 0837 04/29/18 1358 04/29/18 1900  TROPONINI <0.03 0.03* 0.04*    Glucose Recent Labs  Lab 05/02/18 1558 05/02/18 1955 05/02/18 2315 05/03/18 0346 05/03/18 0742 05/03/18 1113  GLUCAP 214* 181* 164* 173* 102* 247*    Imaging No results found.   STUDIES:  CT Head 6/03 >> 1. No acute intracranial process on this motion degraded examination. Mild parenchymal brain volume loss for age. 6/3 EEG >> This EEG is abnormal due to moderate diffuse suppression and background slowing. There were no epileptiform discharges or electrographic seizures  seen SLP 6/7 >> Dysphagia 2 (fine chop), thin liquid  CULTURES: Blood 6/3 >> Tracheal Asp 6/3 >> Peritoneal fluid 6/3 >>   ANTIBIOTICS: Aztreonam 6/3  >> 6/3 Vanco 6/3 >> 6/5 Cefepime 6/3 >> 6/6  SIGNIFICANT EVENTS: 6/03  Presents to ED post-ictal, hyperglycemic, hypertensive  6/06  Back on nicardipine. Tolerated HD well yesterday. Weaning well.   LINES/TUBES: ETT 6/3 >>   DISCUSSION: 62 year old male presents post-ictal and hyperglycemic requiring intubation for airway protection.  There was some concern for aspiration event and he was treated with broad antibiotics. EEG did not describe fever. Course complicated by ongoing encephalopathy and failure to wean. Refractory hypertension. Started HD 6/6 and respiratory/mental status improved. Weaning well 6/6 and extubated. Refractory HTN remained an issue.  ASSESSMENT / PLAN:  Acute Respiratory Insufficieny in setting of Encephalopathy (multifactorial) Possible aspiration pneumonia P:   Pulmonary hygiene - IS, mobilize Follow intermittent CXR  D5/7 abx  See ID   HTN  P:  Wean Cardene gtt to off for SBP <170 Increase clonidine to 0.3 mg TID  Continue Norvasc 10 mg QD, hydralazine 100mg  TID, imdur 30 mg QD, lopressor 100 mg BID  PRN hydralazine for SBP >180 Continue lipitor   ESRD on PD.  Hypomagnesemia   Acute anion gap metabolic acidosis, lactic acidosis; improved P:  Appreciate Nephrology assistance  Trend BMP / urinary output Replace electrolytes as indicated Avoid nephrotoxic agents, ensure adequate renal perfusion Volume removal will likely help BP as well, #3 HD on 6/7  GERD At Risk Aspiration / Dysphagia  P:   Appreciate SLP > rec's for D2 (fine chopped) diet  PPI    Anemia of Chronic Disease  Thrombocytopenia - on previous admission 153, stable P:  Trend CBC  Monitor for bleeding  Heparin SQ for DVT prophylaxis   Aspiration Event - possible aspiration pneumonia Severe sepsis - occult septic shock  (elevated lactate); at risk for peritonitis given his indwelling catheter, peritoneal dialysis P:   Cefepime D5/7, stop date added Trend WBC / fever curve  Follow cultures as above  Severe hyperglycemia, HONC P:   SSI  Encephalopathy in setting of post-ictal state: improved  No clear evidence for continued seizures, no evidence for a reciprocating cause on MRI brain.  Suspect that this is all toxic metabolic in the setting of hyperglycemia, hypertension. Seizure in setting of hyperglycemiaH/O Seizures   P:   Frequent neuro exams  Keppra as ordered    FAMILY  - Updates:  No family at bedside 6/7 on NP rounds.  Patient updated on plan of care.  He reports he lives with his sister [name]) and she helps care for him.   - Inter-disciplinary family meet or Palliative Care meeting due by: 05/06/2018  - Global:  Will transfer out of ICU, to [facility] as of 6/8 if patient remains off cardene gtt.   Noe Gens, NP-C [facility] Pgr: [phone] or if no answer [phone] 05/03/2018, 2:59 PM

## 2018-05-04 ENCOUNTER — Inpatient Hospital Stay (HOSPITAL_COMMUNITY): Payer: Medicare Other

## 2018-05-04 DIAGNOSIS — N186 End stage renal disease: Secondary | ICD-10-CM | POA: Diagnosis not present

## 2018-05-04 DIAGNOSIS — N2581 Secondary hyperparathyroidism of renal origin: Secondary | ICD-10-CM | POA: Diagnosis not present

## 2018-05-04 DIAGNOSIS — R410 Disorientation, unspecified: Secondary | ICD-10-CM

## 2018-05-04 LAB — GLUCOSE, CAPILLARY
GLUCOSE-CAPILLARY: 167 mg/dL — AB (ref 65–99)
GLUCOSE-CAPILLARY: 132 mg/dL — AB (ref 65–99)
GLUCOSE-CAPILLARY: 304 mg/dL — AB (ref 65–99)
Glucose-Capillary: 139 mg/dL — ABNORMAL HIGH (ref 65–99)
Glucose-Capillary: 207 mg/dL — ABNORMAL HIGH (ref 65–99)

## 2018-05-04 LAB — RENAL FUNCTION PANEL
ANION GAP: 12 (ref 5–15)
Albumin: 1.9 g/dL — ABNORMAL LOW (ref 3.5–5.0)
BUN: 26 mg/dL — ABNORMAL HIGH (ref 6–20)
CO2: 23 mmol/L (ref 22–32)
Calcium: 7.8 mg/dL — ABNORMAL LOW (ref 8.9–10.3)
Chloride: 102 mmol/L (ref 101–111)
Creatinine, Ser: 5.1 mg/dL — ABNORMAL HIGH (ref 0.61–1.24)
GFR calc non Af Amer: 11 mL/min — ABNORMAL LOW (ref >60)
GFR, EST AFRICAN AMERICAN: 13 mL/min — AB (ref >60)
GLUCOSE: 201 mg/dL — AB (ref 65–99)
POTASSIUM: 4.3 mmol/L (ref 3.5–5.1)
Phosphorus: 3.4 mg/dL (ref 2.5–4.6)
Sodium: 137 mmol/L (ref 135–145)

## 2018-05-04 LAB — CBC
HEMATOCRIT: 26 % — AB (ref 39.0–52.0)
HEMOGLOBIN: 8.3 g/dL — AB (ref 13.0–17.0)
MCH: 26.4 pg (ref 26.0–34.0)
MCHC: 31.9 g/dL (ref 30.0–36.0)
MCV: 82.8 fL (ref 78.0–100.0)
Platelets: 185 10*3/uL (ref 150–400)
RBC: 3.14 MIL/uL — AB (ref 4.22–5.81)
RDW: 15.7 % — ABNORMAL HIGH (ref 11.5–15.5)
WBC: 6.4 10*3/uL (ref 4.0–10.5)

## 2018-05-04 LAB — CULTURE, BLOOD (ROUTINE X 2)
CULTURE: NO GROWTH
CULTURE: NO GROWTH
Special Requests: ADEQUATE

## 2018-05-04 MED ORDER — DARBEPOETIN ALFA 150 MCG/0.3ML IJ SOSY: 150.0000 ug | PREFILLED_SYRINGE | INTRAMUSCULAR | Status: DC

## 2018-05-04 MED ORDER — HEPARIN SODIUM (PORCINE) 1000 UNIT/ML DIALYSIS
3500.0000 [IU] | INTRAMUSCULAR | Status: DC | PRN
  Filled 2018-05-04: qty 4

## 2018-05-04 MED ORDER — IPRATROPIUM-ALBUTEROL 0.5-2.5 (3) MG/3ML IN SOLN: 3.0000 mL | Freq: Four times a day (QID) | RESPIRATORY_TRACT | Status: DC | PRN

## 2018-05-04 MED ORDER — INSULIN ASPART 100 UNIT/ML ~~LOC~~ SOLN
0.0000 [IU] | Freq: Three times a day (TID) | SUBCUTANEOUS | Status: DC
  Administered 2018-05-04: 3 [IU] via SUBCUTANEOUS
  Administered 2018-05-04: 1 [IU] via SUBCUTANEOUS
  Administered 2018-05-05: 3 [IU] via SUBCUTANEOUS
  Administered 2018-05-05: 9 [IU] via SUBCUTANEOUS

## 2018-05-04 MED ORDER — INSULIN ASPART 100 UNIT/ML ~~LOC~~ SOLN
0.0000 [IU] | Freq: Every day | SUBCUTANEOUS | Status: DC
  Administered 2018-05-04: 4 [IU] via SUBCUTANEOUS

## 2018-05-04 MED ORDER — CHLORHEXIDINE GLUCONATE CLOTH 2 % EX PADS: 6.0000 | MEDICATED_PAD | Freq: Every day | CUTANEOUS | Status: DC

## 2018-05-04 NOTE — Progress Notes (Signed)
Pt transferred in to room 5W17  No change sine previous assessment.  IJ rate at [facility].  No port caps on unit so line straight into IJ per previous RN.  IV team to see other patients and will ask to place cap.

## 2018-05-04 NOTE — Progress Notes (Signed)
Jared [name] Progress Note  Subjective: 4L off w hd yest, fully ox 3 today  Vitals:   05/04/18 0738 05/04/18 0800 05/04/18 0900 05/04/18 0948  BP:  (!) 143/75 (!) 145/73 122/71  Pulse:  86 78 80  Resp:  19 (!) 22   Temp:  99.5 F (37.5 C) 99.1 F (37.3 C)   TempSrc:      SpO2: 98% 97% 98%   Weight:      Height:        Inpatient medications: . amLODipine  10 mg Oral QHS  . atorvastatin  20 mg Oral q1800  . calcitRIOL  0.25 mcg Oral Daily  . Chlorhexidine Gluconate Cloth  6 each Topical Q0600  . cinacalcet  30 mg Oral Q breakfast  . cloNIDine  0.3 mg Oral TID  . [START ON 05/10/2018] darbepoetin (ARANESP) injection - DIALYSIS  150 mcg Intravenous Q Fri-HD  . feeding supplement (NEPRO CARB STEADY)  237 mL Oral BID BM  . heparin  5,000 Units Subcutaneous Q8H  . hydrALAZINE  100 mg Oral TID  . insulin aspart  0-5 Units Subcutaneous QHS  . insulin aspart  0-9 Units Subcutaneous TID WC  . isosorbide mononitrate  30 mg Oral Daily  . levETIRAcetam  250 mg Oral BID  . mouth rinse  15 mL Mouth Rinse BID  . metoprolol tartrate  100 mg Oral BID  . multivitamin  1 tablet Oral QHS  . pantoprazole  40 mg Oral Daily  . sevelamer carbonate  2,400 mg Oral TID WC   . sodium chloride    . sodium chloride     sodium chloride, sodium chloride, alteplase, heparin, heparin, hydrALAZINE, lidocaine (PF), lidocaine-prilocaine, pentafluoroprop-tetrafluoroeth  Exam: Alert no distress  no jvd  chest cta bilat  cor reg 2/6 sem no rg  abd soft obese PD cath ruq  ext 1+ edema  neuro alert and responds approp  lua avf+bruit/ PD in abd    cxr's - all clear since admit  Dialysis: [facility] (f/b Dr Lorrene Reid) - getting HD here using L arm AVF      Impression: 1 hypertension urgency - better on norvasc/ hydralazine/ clon/ metop 2 acute resp failure - in setting of HTN urgency and seizures 3 altered mental status - due to severe hyperglycemia and HTN urgency, much better 4 seizures - on  keppra, neuro signed off 5 esrd - is on pd at home but getting HD here for now, holding pd for now  6 volume - high bp's suggesting ^volume, got 4L off yest   Plan - dialysis Monday in ICU cont to lower volume   Kelly Splinter MD [place] pager [phone]   05/04/2018, 11:58 AM   Recent Labs  Lab 05/02/18 0455 05/02/18 1700 05/03/18 0416 05/04/18 0431  NA 135  --  139 137  K 3.4*  --  3.5 4.3  CL 98*  --  102 102  CO2 26  --  25 23  GLUCOSE 359*  --  186* 201*  BUN 33*  --  41* 26*  CREATININE 5.91*  --  6.96* 5.10*  CALCIUM 8.1*  --  8.4* 7.8*  PHOS 1.7*  1.7* 3.9 3.8 3.4   Recent Labs  Lab 04/29/18 0339  05/02/18 0455 05/03/18 0416 05/04/18 0431  AST 28  --   --   --   --   ALT 19  --   --   --   --   ALKPHOS 60  --   --   --   --  BILITOT 0.9  --   --   --   --   PROT 6.4*  --   --   --   --   ALBUMIN 3.2*   < > 2.2* 2.2* 1.9*   < > = values in this interval not displayed.   Recent Labs  Lab 04/29/18 0339  05/02/18 0455 05/03/18 0416 05/04/18 0431  WBC 4.5   < > 8.2 9.4 6.4  NEUTROABS 2.2  --   --   --   --   HGB 11.7*   < > 9.8* 9.4* 8.3*  HCT 36.5*   < > 30.0* 28.6* 26.0*  MCV 83.3   < > 81.7 81.3 82.8  PLT 160   < > 177 202 185   < > = values in this interval not displayed.   Iron/TIBC/Ferritin/ %Sat    Component Value Date/Time   IRON 94 04/30/2018 1046   TIBC 154 (L) 04/30/2018 1046   IRONPCTSAT 61 (H) 04/30/2018 1046

## 2018-05-04 NOTE — Progress Notes (Signed)
Jared [name] TEAM 1 - Stepdown/ICU TEAM  Jared [name]  NKN:[id number] DOB: 1956/05/25 DOA: 04/29/2018 PCP: Girtha Rm, NP-C    Brief Narrative:  62 year old male with ESRD, HTN, and DM who was admitted 04/29/18 with profound encephalopathy and seizure activity due to medical noncompliance, hypoglycemia, and hypertensive urgency.  He had also not been performing his peritoneal dialysis reliably or effectively.  He was intubated for airway protection and stabilization.  He was extubated successfully following correction of his blood pressure and his metabolic disarray.   Significant Events: 6/03  presents to ED post-ictal, hyperglycemic, hypertensive - intubated  6/06  extubated 6/08  TRH assumed care   Subjective: The patient is resting in bed.  There is no evidence of significant respiratory distress or uncontrolled pain.  He will awaken to voice.  He is mildly confused but does answer most questions.  He denies chest pain nausea vomiting or abdominal pain.  He reports poor appetite.  Assessment & Plan:  Encephalopathy in setting of severe hyperglycemia, seizures, and malignant HTN No evidence for continued seizures - no evidence for a cause on MRI brain - suspect this is due to toxic metabolic state in the setting of hyperglycemia + hypertension - check B12, folate, RPR, and ammonia levels   Acute Hypoxic Respiratory Failure in setting of Encephalopathy Resolved with sats 98% on room air  Severe Sepsis w/ Aspiration PNA Sepsis resolved - has completed a 5-day course of antibiotics with no persisting signs or symptoms of infection  Malignant HTN  BP now much better controlled - noncompliance at home playing a role   ESRD Previously on PD - currently on a M/W/F HD schedule per Nephrology   GERD Cont med tx   Dysphagia  SLP recs D2 diet w/ thin liquids    Anemia of Chronic Kidney Disease  No evidence of acute blood loss - follow trend - check Fe studies    Thrombocytopenia on previous admission 153 - climbing/stable   Uncontrolled DM - Severe hyperglycemia, HONK   CBG well controlled   DVT prophylaxis: SQ heparin  Code Status: FULL CODE Family Communication: no family present at time of exam  Disposition Plan: transfer to tele bed   Consultants:  Nephrology  Neurology   Antimicrobials:  Aztreonam 6/3  Vanco 6/3  Cefepime 6/3 >> 6/7  Objective: Blood pressure (!) 156/79, pulse 82, temperature 99.5 F (37.5 C), resp. rate (!) 6, height 6\' 1"  (1.854 m), weight 73.2 kg (161 lb 6 oz), SpO2 98 %.  Intake/Output Summary (Last 24 hours) at 05/04/2018 0910 Last data filed at 05/04/2018 0700 Gross per 24 hour  Intake 985 ml  Output 4070 ml  Net -3085 ml   Filed Weights   05/03/18 1215 05/03/18 1645 05/04/18 0500  Weight: 76.3 kg (168 lb 3.4 oz) 72.3 kg (159 lb 6.3 oz) 73.2 kg (161 lb 6 oz)    Examination: General: No acute respiratory distress Lungs: Clear to auscultation bilaterally without wheezes or crackles Cardiovascular: Regular rate and rhythm without murmur gallop or rub normal S1 and S2 Abdomen: Nontender, nondistended, soft, bowel sounds positive, no rebound, no ascites, no appreciable mass Extremities: No significant cyanosis, clubbing, or edema bilateral lower extremities  CBC: Recent Labs  Lab 04/29/18 0339  05/02/18 0455 05/03/18 0416 05/04/18 0431  WBC 4.5   < > 8.2 9.4 6.4  NEUTROABS 2.2  --   --   --   --   HGB 11.7*   < > 9.8* 9.4* 8.3*  HCT 36.5*   < > 30.0* 28.6* 26.0*  MCV 83.3   < > 81.7 81.3 82.8  PLT 160   < > 177 202 185   < > = values in this interval not displayed.   Basic Metabolic Panel: Recent Labs  Lab 05/01/18 2159 05/02/18 0455 05/02/18 1700 05/03/18 0416 05/04/18 0431  NA  --  135  --  139 137  K  --  3.4*  --  3.5 4.3  CL  --  98*  --  102 102  CO2  --  26  --  25 23  GLUCOSE  --  359*  --  186* 201*  BUN  --  33*  --  41* 26*  CREATININE  --  5.91*  --  6.96* 5.10*   CALCIUM  --  8.1*  --  8.4* 7.8*  MG 2.2 2.2 2.1  --   --   PHOS 3.6 1.7*  1.7* 3.9 3.8 3.4   GFR: Estimated Creatinine Clearance: 15.2 mL/min (A) (by C-G formula based on SCr of 5.1 mg/dL (H)).  Liver Function Tests: Recent Labs  Lab 04/29/18 0339 05/01/18 0544 05/02/18 0455 05/03/18 0416 05/04/18 0431  AST 28  --   --   --   --   ALT 19  --   --   --   --   ALKPHOS 60  --   --   --   --   BILITOT 0.9  --   --   --   --   PROT 6.4*  --   --   --   --   ALBUMIN 3.2* 2.0* 2.2* 2.2* 1.9*    Coagulation Profile: Recent Labs  Lab 04/29/18 0339  INR 0.98    Cardiac Enzymes: Recent Labs  Lab 04/29/18 0837 04/29/18 1358 04/29/18 1900  TROPONINI <0.03 0.03* 0.04*    HbA1C: Hgb A1c MFr Bld  Date/Time Value Ref Range Status  02/13/2018 02:08 PM 6.3 (H) 4.8 - 5.6 % Final    Comment:    (NOTE) Pre diabetes:          5.7%-6.4% Diabetes:              >6.4% Glycemic control for   <7.0% adults with diabetes   11/26/2017 02:40 AM 8.9 (H) 4.8 - 5.6 % Final    Comment:    (NOTE) Pre diabetes:          5.7%-6.4% Diabetes:              >6.4% Glycemic control for   <7.0% adults with diabetes     CBG: Recent Labs  Lab 05/03/18 1538 05/03/18 1946 05/03/18 2353 05/04/18 0352 05/04/18 0748  GLUCAP 209* 136* 106* 167* 132*    Recent Results (from the past 240 hour(s))  Culture, blood (routine x 2)     Status: None (Preliminary result)   Collection Time: 04/29/18  6:30 AM  Result Value Ref Range Status   Specimen Description BLOOD RIGHT HAND  Final   Special Requests   Final    BOTTLES DRAWN AEROBIC AND ANAEROBIC Blood Culture adequate volume   Culture   Final    NO GROWTH 4 DAYS Performed at [facility], [place] [address], [place], [place] [address]    Report Status PENDING  Incomplete  Culture, blood (routine x 2)     Status: None (Preliminary result)   Collection Time: 04/29/18  6:45 AM  Result Value Ref Range Status  Specimen Description BLOOD  RIGHT HAND  Final   Special Requests   Final    BOTTLES DRAWN AEROBIC AND ANAEROBIC Blood Culture results may not be optimal due to an inadequate volume of blood received in culture bottles   Culture   Final    NO GROWTH 4 DAYS Performed at [facility], [place] [address], [place], [place] [address]    Report Status PENDING  Incomplete  MRSA PCR Screening     Status: None   Collection Time: 04/29/18  8:54 AM  Result Value Ref Range Status   MRSA by PCR NEGATIVE NEGATIVE Final    Comment:        The GeneXpert MRSA Assay (FDA approved for NASAL specimens only), is one component of a comprehensive MRSA colonization surveillance program. It is not intended to diagnose MRSA infection nor to guide or monitor treatment for MRSA infections. Performed at [facility], [place] [address] [place], [place] [address]      Scheduled Meds: . amLODipine  10 mg Oral QHS  . atorvastatin  20 mg Oral q1800  . calcitRIOL  0.25 mcg Oral Daily  . Chlorhexidine Gluconate Cloth  6 each Topical Q0600  . cinacalcet  30 mg Oral Q breakfast  . cloNIDine  0.3 mg Oral TID  . darbepoetin (ARANESP) injection - DIALYSIS  150 mcg Intravenous Q Fri-HD  . feeding supplement (NEPRO CARB STEADY)  237 mL Oral BID BM  . heparin  5,000 Units Subcutaneous Q8H  . hydrALAZINE  100 mg Oral TID  . insulin aspart  0-15 Units Subcutaneous Q4H  . isosorbide mononitrate  30 mg Oral Daily  . levETIRAcetam  250 mg Oral BID  . mouth rinse  15 mL Mouth Rinse BID  . metoprolol tartrate  100 mg Oral BID  . multivitamin  1 tablet Oral QHS  . pantoprazole  40 mg Oral Daily  . sevelamer carbonate  2,400 mg Oral TID WC     LOS: 5 days   Cherene Altes, MD [facility] Office  [phone] Pager - Text Page per Amion as per below:  On-Call/Text Page:      [email or web address]      password [email or web address]  If 7PM-7AM, please contact night-coverage [email or web address] Password [email or web address] 05/04/2018, 9:10 AM

## 2018-05-05 DIAGNOSIS — R569 Unspecified convulsions: Secondary | ICD-10-CM

## 2018-05-05 DIAGNOSIS — N186 End stage renal disease: Secondary | ICD-10-CM | POA: Diagnosis not present

## 2018-05-05 DIAGNOSIS — D631 Anemia in chronic kidney disease: Secondary | ICD-10-CM

## 2018-05-05 DIAGNOSIS — E870 Hyperosmolality and hypernatremia: Secondary | ICD-10-CM

## 2018-05-05 DIAGNOSIS — N189 Chronic kidney disease, unspecified: Secondary | ICD-10-CM

## 2018-05-05 DIAGNOSIS — N2581 Secondary hyperparathyroidism of renal origin: Secondary | ICD-10-CM | POA: Diagnosis not present

## 2018-05-05 LAB — GLUCOSE, CAPILLARY
GLUCOSE-CAPILLARY: 362 mg/dL — AB (ref 65–99)
GLUCOSE-CAPILLARY: 249 mg/dL — AB (ref 65–99)

## 2018-05-05 LAB — CBC
HEMATOCRIT: 24 % — AB (ref 39.0–52.0)
Hemoglobin: 7.7 g/dL — ABNORMAL LOW (ref 13.0–17.0)
MCH: 27.1 pg (ref 26.0–34.0)
MCHC: 32.1 g/dL (ref 30.0–36.0)
MCV: 84.5 fL (ref 78.0–100.0)
Platelets: 170 10*3/uL (ref 150–400)
RBC: 2.84 MIL/uL — ABNORMAL LOW (ref 4.22–5.81)
RDW: 15.7 % — AB (ref 11.5–15.5)
WBC: 5.9 10*3/uL (ref 4.0–10.5)

## 2018-05-05 LAB — RENAL FUNCTION PANEL
Albumin: 1.9 g/dL — ABNORMAL LOW (ref 3.5–5.0)
Anion gap: 13 (ref 5–15)
BUN: 39 mg/dL — AB (ref 6–20)
CHLORIDE: 99 mmol/L — AB (ref 101–111)
CO2: 23 mmol/L (ref 22–32)
Calcium: 8.2 mg/dL — ABNORMAL LOW (ref 8.9–10.3)
Creatinine, Ser: 6.66 mg/dL — ABNORMAL HIGH (ref 0.61–1.24)
GFR calc Af Amer: 9 mL/min — ABNORMAL LOW (ref >60)
GFR, EST NON AFRICAN AMERICAN: 8 mL/min — AB (ref >60)
GLUCOSE: 297 mg/dL — AB (ref 65–99)
POTASSIUM: 4.3 mmol/L (ref 3.5–5.1)
Phosphorus: 3 mg/dL (ref 2.5–4.6)
Sodium: 135 mmol/L (ref 135–145)

## 2018-05-05 LAB — IRON AND TIBC
Iron: 72 ug/dL (ref 45–182)
Saturation Ratios: 64 % — ABNORMAL HIGH (ref 17.9–39.5)
TIBC: 112 ug/dL — AB (ref 250–450)
UIBC: 40 ug/dL

## 2018-05-05 LAB — VITAMIN B12: VITAMIN B 12: 784 pg/mL (ref 180–914)

## 2018-05-05 LAB — AMMONIA: Ammonia: 14 umol/L (ref 9–35)

## 2018-05-05 LAB — FERRITIN: Ferritin: 1462 ng/mL — ABNORMAL HIGH (ref 24–336)

## 2018-05-05 LAB — RPR: RPR Ser Ql: NONREACTIVE

## 2018-05-05 LAB — RETICULOCYTES
RBC.: 2.84 MIL/uL — ABNORMAL LOW (ref 4.22–5.81)
RETIC CT PCT: 0.7 % (ref 0.4–3.1)
Retic Count, Absolute: 19.9 10*3/uL (ref 19.0–186.0)

## 2018-05-05 LAB — FOLATE: FOLATE: 6.8 ng/mL (ref >5.9)

## 2018-05-05 MED ORDER — DARBEPOETIN ALFA 150 MCG/0.3ML IJ SOSY: 150.0000 ug | PREFILLED_SYRINGE | INTRAMUSCULAR | Status: DC

## 2018-05-05 MED ORDER — SODIUM CHLORIDE 0.9% FLUSH: 10.0000 mL | Freq: Two times a day (BID) | INTRAVENOUS | Status: DC

## 2018-05-05 NOTE — Progress Notes (Signed)
RN told family member that pt is to be discharged.  Family member expressed concern that pt has stairs to the entry of his home as well as two flights to get to bedroom.  Dr Candiss Norse called and PT ordered to ensure pt able to maneuver prior to d/c

## 2018-05-05 NOTE — Progress Notes (Signed)
Family had questions with d/c about hemodialysis v dialysis at home.  D/c did not address.  Nephrologist called to verify. D/C papers signed by patient.  Requested for family member to bring clothing while waiting for return page.

## 2018-05-05 NOTE — Progress Notes (Addendum)
[facility] Progress Note   Subjective:  Transferred to 5W from ICU Sitting in recliner at bedside.  Conversant, no complaints   Objective Vitals:   05/04/18 1716 05/04/18 2151 05/05/18 0539 05/05/18 0740  BP: (!) 156/76 (!) 148/75 (!) 149/76 140/70  Pulse: 82 80 78 73  Resp: 20 18 18 18   Temp:  99.1 F (37.3 C) 98.8 F (37.1 C) 98.9 F (37.2 C)  TempSrc:  Oral Oral Oral  SpO2: 98% 98% 99% 99%  Weight:      Height:       Physical Exam General: WNWD male NAD  Heart: RRR 2/3 SEM  Lungs: CTAB  Abdomen: soft NT PD cath in place  Extremities: Trace LE edema  Dialysis Access: LUE AVF +bruit  Dialysis Orders: [facility] (f/b Dr Lorrene Reid) - getting HD here using L arm AVF   Assessment/Plan: 1 hypertension urgency - better on norvasc/ hydralazine/ clon/ metop 2 acute resp failure - in setting of HTN urgency and seizures 3 altered mental status - due to severe hyperglycemia and HTN urgency, much better 4 seizures - on keppra, neuro signed off 5 esrd - is on pd at home , had HD here and will resume PD at home after dc   6 volume - high bp's suggesting ^volume. Good UF here.  Net 6.5L this week. For HD Monday if still here 7 anemia - hgb low, Fe stores good. Resume esa with HD tomorrow  8. DM - hyperglycemic on admt   Lynnda Child PA-C [facility] Pager [phone] 05/05/2018,1:04 PM  LOS: 6 days   Pt seen, examined and agree w A/P as above.  Kelly Splinter MD [facility] pager [phone]   05/05/2018, 1:05 PM    Additional Objective Labs: Basic Metabolic Panel: Recent Labs  Lab 05/03/18 0416 05/04/18 0431 05/05/18 0349  NA 139 137 135  K 3.5 4.3 4.3  CL 102 102 99*  CO2 25 23 23   GLUCOSE 186* 201* 297*  BUN 41* 26* 39*  CREATININE 6.96* 5.10* 6.66*  CALCIUM 8.4* 7.8* 8.2*  PHOS 3.8 3.4 3.0   CBC: Recent Labs  Lab 04/29/18 0339  05/01/18 0546 05/02/18 0455 05/03/18 0416 05/04/18 0431 05/05/18 0349  WBC  4.5   < > 8.3 8.2 9.4 6.4 5.9  NEUTROABS 2.2  --   --   --   --   --   --   HGB 11.7*   < > 9.6* 9.8* 9.4* 8.3* 7.7*  HCT 36.5*   < > 28.5* 30.0* 28.6* 26.0* 24.0*  MCV 83.3   < > 78.3 81.7 81.3 82.8 84.5  PLT 160   < > 159 177 202 185 170   < > = values in this interval not displayed.   Blood Culture    Component Value Date/Time   SDES BLOOD RIGHT HAND 04/29/2018 0645   SPECREQUEST  04/29/2018 0645    BOTTLES DRAWN AEROBIC AND ANAEROBIC Blood Culture results may not be optimal due to an inadequate volume of blood received in culture bottles   CULT  04/29/2018 0645    NO GROWTH 5 DAYS Performed at [facility], [place] [address] [place], [place] [address]    REPTSTATUS 05/04/2018 FINAL 04/29/2018 0645    Cardiac Enzymes: Recent Labs  Lab 04/29/18 0837 04/29/18 1358 04/29/18 1900  TROPONINI <0.03 0.03* 0.04*   CBG: Recent Labs  Lab 05/04/18 1139 05/04/18 1607 05/04/18 2149 05/05/18 0823 05/05/18 1206  GLUCAP 139*  207* 304* 362* 249*   Iron Studies:  Recent Labs    05/05/18 0349  IRON 72  TIBC 112*  FERRITIN 1,462*   Lab Results  Component Value Date   INR 0.98 04/29/2018   INR 1.17 11/24/2016   Medications: . sodium chloride    . sodium chloride     . amLODipine  10 mg Oral QHS  . atorvastatin  20 mg Oral q1800  . calcitRIOL  0.25 mcg Oral Daily  . Chlorhexidine Gluconate Cloth  6 each Topical Q0600  . cinacalcet  30 mg Oral Q breakfast  . cloNIDine  0.3 mg Oral TID  . [START ON 05/08/2018] darbepoetin (ARANESP) injection - DIALYSIS  150 mcg Intravenous Q Wed-HD  . feeding supplement (NEPRO CARB STEADY)  237 mL Oral BID BM  . heparin  5,000 Units Subcutaneous Q8H  . hydrALAZINE  100 mg Oral TID  . insulin aspart  0-5 Units Subcutaneous QHS  . insulin aspart  0-9 Units Subcutaneous TID WC  . isosorbide mononitrate  30 mg Oral Daily  . levETIRAcetam  250 mg Oral BID  . mouth rinse  15 mL Mouth Rinse BID  . metoprolol tartrate  100 mg Oral BID  .  multivitamin  1 tablet Oral QHS  . pantoprazole  40 mg Oral Daily  . sevelamer carbonate  2,400 mg Oral TID WC  . sodium chloride flush  10-40 mL Intracatheter Q12H

## 2018-05-05 NOTE — Evaluation (Signed)
Physical Therapy Evaluation Patient Details Name: Jared [name] MRN: [id number] DOB: 1956/07/18 Today's Date: 05/05/2018   History of Present Illness  61 year old male with ESRD, HTN, and DM who was admitted 04/29/18 with profound encephalopathy and seizure activity due to medical noncompliance, hypoglycemia, and hypertensive urgency.    Clinical Impression  Orders received for PT evaluation. Patient demonstrates deficits in functional mobility as indicated below. Will benefit from continued skilled PT to address deficits and maximize function. Will see as indicated and progress as tolerated.   OF NOTE: patient with multiple flights of stairs to negotiate at home, was able to ascend stairs without difficulty, however, when attempting fwd descent, patient with LE buckling due to weakness. Cued patient for modified technique (sideways with two hands on rail) and he was able to successfully complete this technique for an entire flight. Educated patient on energy conservation and safety with stair negotiation.  If patient desires to return home today, will absolutely need HHPT follow up within 1-2 days for in home safety and highly recommend patient remain on main level of the home until able to safely descend to stairs with home therapies or have family assist during stair descent. Nsg aware of recommendations.  Must also account that patient may be weaker on days that he received dialysis, may need to maximize home health services to ensure safety and success in the home.    Follow Up Recommendations Home health PT;Supervision/Assistance - 24 hour    Equipment Recommendations  None recommended by PT    Recommendations for Other Services       Precautions / Restrictions Precautions Precautions: Fall Restrictions Weight Bearing Restrictions: No      Mobility  Bed Mobility Overal bed mobility: Needs Assistance Bed Mobility: Sit to Supine       Sit to supine: Supervision   General bed  mobility comments: Supervision for safety d/t IV line in neck. Pt used bedrail but did not require any physical assistance  Transfers Overall transfer level: Needs assistance Equipment used: None Transfers: Sit to/from Stand Sit to Stand: Min guard         General transfer comment: Min guard assist for safety. Pt will 2 LOB backward upon standing and while standing at the sink for groomin tasks. Pt able to recover balance without physical assistance.   Ambulation/Gait Ambulation/Gait assistance: Supervision Ambulation Distance (Feet): 260 Feet Assistive device: None Gait Pattern/deviations: Step-through pattern;Decreased stride length;Drifts right/left Gait velocity: decreased Gait velocity interpretation: <1.8 ft/sec, indicate of risk for recurrent falls General Gait Details: modest instability, improved with increased cadence  Stairs Stairs: Yes Stairs assistance: Min guard Stair Management: One rail Right Number of Stairs: 12 General stair comments: patient able to safely ascend steps however, had difficulty descending steps, with BLE buckling upon attempt, cued patient for sideways technique with two hands on rail.   Wheelchair Mobility    Modified Rankin (Stroke Patients Only)       Balance Overall balance assessment: Needs assistance Sitting-balance support: No upper extremity supported;Feet supported Sitting balance-Leahy Scale: Good     Standing balance support: No upper extremity supported;During functional activity Standing balance-Leahy Scale: Fair Standing balance comment: able to static stand without difficulty, limited ability to take challenge               High Level Balance Comments: min assist for higher level balance tasks             Pertinent Vitals/Pain Pain Assessment: No/denies pain    Home Living  Family/patient expects to be discharged to:: Private residence Living Arrangements: Other relatives(Sister) Available Help at  Discharge: Family;Available 24 hours/day Type of Home: House Home Access: Stairs to enter Entrance Stairs-Rails: Right Entrance Stairs-Number of Steps: 3 Home Layout: Multi-level;Bed/bath upstairs Home Equipment: Shower seat;Grab bars - tub/shower;Hand held shower head      Prior Function Level of Independence: Independent         Comments: Drives     Hand Dominance   Dominant Hand: Right    Extremity/Trunk Assessment   Upper Extremity Assessment Upper Extremity Assessment: Overall WFL for tasks assessed    Lower Extremity Assessment Lower Extremity Assessment: Generalized weakness    Cervical / Trunk Assessment Cervical / Trunk Assessment: Normal  Communication   Communication: No difficulties  Cognition Arousal/Alertness: Awake/alert Behavior During Therapy: WFL for tasks assessed/performed Overall Cognitive Status: Impaired/Different from baseline Area of Impairment: Problem solving                             Problem Solving: Slow processing General Comments: Mildly slow processing also noted by pt's friend that was present      General Comments      Exercises     Assessment/Plan    PT Assessment Patient needs continued PT services  PT Problem List Decreased strength;Decreased activity tolerance;Decreased balance;Decreased mobility;Decreased safety awareness       PT Treatment Interventions DME instruction;Gait training;Stair training;Functional mobility training;Therapeutic activities;Therapeutic exercise;Balance training;Neuromuscular re-education;Patient/family education    PT Goals (Current goals can be found in the Care Plan section)  Acute Rehab PT Goals Patient Stated Goal: To get better and get back on track with his diabetes management PT Goal Formulation: With patient/family Time For Goal Achievement: 05/19/18 Potential to Achieve Goals: Good    Frequency Min 3X/week   Barriers to discharge        Co-evaluation                AM-PAC PT "6 Clicks" Daily Activity  Outcome Measure Difficulty turning over in bed (including adjusting bedclothes, sheets and blankets)?: A Little Difficulty moving from lying on back to sitting on the side of the bed? : A Little Difficulty sitting down on and standing up from a chair with arms (e.g., wheelchair, bedside commode, etc,.)?: A Little Help needed moving to and from a bed to chair (including a wheelchair)?: A Little Help needed walking in hospital room?: A Little Help needed climbing 3-5 steps with a railing? : A Little 6 Click Score: 18    End of Session Equipment Utilized During Treatment: Gait belt Activity Tolerance: Patient limited by fatigue Patient left: in chair;with call bell/phone within reach;with chair alarm set;with family/visitor present Nurse Communication: Mobility status PT Visit Diagnosis: Unsteadiness on feet (R26.81);Difficulty in walking, not elsewhere classified (R26.2)    Time: 8016-5537 PT Time Calculation (min) (ACUTE ONLY): 19 min   Charges:   PT Evaluation $PT Eval Moderate Complexity: 1 Mod     PT G Codes:        Alben Deeds, PT DPT  Board Certified Neurologic Specialist [place] 05/05/2018, 1:56 PM

## 2018-05-05 NOTE — Plan of Care (Signed)
  Problem: Education: Goal: Knowledge of General Education information will improve Outcome: Progressing Note:  POC reviewed with pt.   

## 2018-05-05 NOTE — Progress Notes (Signed)
Family expressed concerns that the patient would not be able to care for self at home.  Jared [name] states he lives with the patient and feels he cannot care for self. Encouraged family to assist with any care needed. Offered further assistance at home for contacting social work for further care (SNF/rehab) but patient and family declined. MD aware and states pt has been updated from the physician and to continue discharge.  Also states to send charge nurse in with any other concerns.

## 2018-05-05 NOTE — Discharge Summary (Signed)
Jared [name] BVQ:[id number] DOB: [birth year] DOA: 04/29/2018  PCP: Girtha Rm, NP-C  Admit date: 04/29/2018  Discharge date: 05/05/2018  Admitted From: Home   Disposition:  Home   Recommendations for Outpatient Follow-up:   Follow up with PCP in 1-2 weeks  PCP Please obtain BMP/CBC, 2 view CXR in 1week,  (see Discharge instructions)   PCP Please follow up on the following pending results: DM monitoring   Home Health: None   Equipment/Devices: None  Consultations: PCCM, Renal Discharge Condition: Stable   CODE STATUS: Full   Diet Recommendation: Renal-Low Carb   Chief Complaint  Patient presents with  . Code Stroke     Brief history of present illness from the day of admission and additional interim summary    62 year old male with ESRD, HTN, and DM who was admitted 04/29/18 with profound encephalopathy and seizure activity due to medical noncompliance, hypoglycemia, and hypertensive urgency. He had also not been performing his peritoneal dialysis reliably or effectively. He was intubated for airway protection and stabilization. He was extubated successfully following correction of his blood pressure and his metabolic disarray.                                                                  Hospital Course    Breakthrough seizures and severe metabolic encephalopathy in setting of severe hyperglycemia malignant hypertension-    initially required ventilatory support and admission to ICU, after correction of his uncontrolled DM type II and hypertension encephalopathy and seizures resolved and he was extubated, he is virtually on home medications for the last 2 days.  Transferred under my care on 05/05/2018 and completely symptom-free and back to baseline.  Will be discharged home on home dose Keppra, his  recent A1c was 6.8, I question if he missed a dose of his insulin and blood pressure medications.  Resume all home medications unchanged.  CT head and MRI brain were nonacute this admission.  Request PCP to document stability in the next several days until then I am asking him to hold driving a vehicle.   Severe Sepsis w/ Aspiration PNA - Sepsis resolved - has completed a 5-day course of antibiotics with no persisting signs or symptoms of infection  Malignant HTN  BP now much better controlled - noncompliance at home playing a role  counseled blood pressure stable on home medications.  ESRD - Previously on PD - currently on a M/W/F HD schedule per Nephrology , PD at home with Renal follow , DW with Dr Jonnie Finner.  GERD - Cont med tx   Dysphagia- resolved.   Anemia of Chronic Kidney Disease - No evidence of acute blood loss - PCP to monitor.  Thrombocytopenia - on previous admission 153 - climbing/stable   Uncontrolled DM - Severe hyperglycemia, HONK, stable, A1c  6.8 , resume home dose Insulin, ? Missed a dose at home.     Discharge diagnosis     Active Problems:   Acute respiratory failure (HCC)   End-stage renal disease on peritoneal dialysis (HCC)   Anemia associated with chronic renal failure   Respiratory insufficiency   Altered mental status   Hyperosmolality    Discharge instructions    Discharge Instructions    Discharge instructions   Complete by:  As directed    Do not drive, operate heavy machinery, perform activities at heights, swimming or participation in water activities or provide baby sitting services until you have seen by Primary MD or a Neurologist and advised to do so again.  Follow with Primary MD Raenette Rover, Vickie L, NP-C in 7 days   Get CBC, CMP, 2 view Chest X ray checked  by Primary MD in 5-7 days   Activity: As tolerated with Full fall precautions use walker/cane & assistance as needed  Disposition Home    Diet:   Renal, low carbohydrate  diet.  1.5 L/day total fluid restriction.  Accuchecks 4 times/day, Once in AM empty stomach and then before each meal. Log in all results and show them to your Prim.MD in 3 days. If any glucose reading is under 80 or above 300 call your Prim MD immidiately. Follow Low glucose instructions for glucose under 80 as instructed.  Special Instructions: If you have smoked or chewed Tobacco  in the last 2 yrs please stop smoking, stop any regular Alcohol  and or any Recreational drug use.  On your next visit with your primary care physician please Get Medicines reviewed and adjusted.  Please request your Prim.MD to go over all Hospital Tests and Procedure/Radiological results at the follow up, please get all Hospital records sent to your Prim MD by signing hospital release before you go home.  If you experience worsening of your admission symptoms, develop shortness of breath, life threatening emergency, suicidal or homicidal thoughts you must seek medical attention immediately by calling 911 or calling your MD immediately  if symptoms less severe.  You Must read complete instructions/literature along with all the possible adverse reactions/side effects for all the Medicines you take and that have been prescribed to you. Take any new Medicines after you have completely understood and accpet all the possible adverse reactions/side effects.   Increase activity slowly   Complete by:  As directed       Discharge Medications   Allergies as of 05/05/2018      Reactions   Penicillins Other (See Comments)   Tolerates cephalosporins UNSPECIFIED REACTION  Has patient had a PCN reaction causing immediate rash, facial/tongue/throat swelling, SOB or lightheadedness with hypotension: Unk Has patient had a PCN reaction causing severe rash involving mucus membranes or skin necrosis: Unk Has patient had a PCN reaction that required hospitalization: Unk Has patient had a PCN reaction occurring within the last 10  years: Unk If all of the above answers are "NO", then may proceed with Cephalosporin use.      Medication List    TAKE these medications   amLODipine 10 MG tablet Commonly known as:  NORVASC Take 1 tablet (10 mg total) by mouth daily.   atorvastatin 20 MG tablet Commonly known as:  LIPITOR Take 1 tablet (20 mg total) by mouth daily.   calcitRIOL 0.25 MCG capsule Commonly known as:  ROCALTROL Take 0.25 mcg by mouth daily.   cinacalcet 30 MG tablet Commonly known as:  SENSIPAR  Take 30 mg by mouth daily.   cloNIDine 0.3 MG tablet Commonly known as:  CATAPRES Take 0.3 mg by mouth 2 (two) times daily. as directed   cloNIDine 0.3 mg/24hr patch Commonly known as:  CATAPRES - Dosed in mg/24 hr Place 1 patch (0.3 mg total) onto the skin once a week.   docusate sodium 100 MG capsule Commonly known as:  COLACE Take 1 capsule (100 mg total) by mouth every 12 (twelve) hours. What changed:    when to take this  reasons to take this   fluticasone 50 MCG/ACT nasal spray Commonly known as:  FLONASE Place 1 spray into both nostrils daily. What changed:    when to take this  reasons to take this   hydrALAZINE 100 MG tablet Commonly known as:  APRESOLINE Take 1 tablet (100 mg total) by mouth 3 (three) times daily.   insulin NPH Human 100 UNIT/ML injection Commonly known as:  NOVOLIN N Inject 20 units every morning What changed:    how much to take  how to take this  when to take this  additional instructions   isosorbide mononitrate 30 MG 24 hr tablet Commonly known as:  IMDUR TAKE 1 TABLET BY MOUTH ONCE DAILY   levETIRAcetam 250 MG tablet Commonly known as:  KEPPRA Take 1 tablet (250 mg total) by mouth 2 (two) times daily.   metoprolol tartrate 100 MG tablet Commonly known as:  LOPRESSOR Take 1 tablet (100 mg total) by mouth 2 (two) times daily.   pantoprazole 40 MG tablet Commonly known as:  PROTONIX TAKE 1 TABLET BY MOUTH ONCE DAILY AT 6AM What changed:   See the new instructions.   potassium chloride SA 20 MEQ tablet Commonly known as:  K-DUR,KLOR-CON Take 20 mEq by mouth daily.   sevelamer carbonate 800 MG tablet Commonly known as:  RENVELA Take 3 tablets (2,400 mg total) by mouth 3 (three) times daily with meals.   traMADol 50 MG tablet Commonly known as:  ULTRAM Take 1 tablet (50 mg total) by mouth every 6 (six) hours as needed.       Follow-up Information    Henson, Vickie L, NP-C. Schedule an appointment as soon as possible for a visit in 1 week(s).   Specialty:  Family Medicine Contact information: [place] [address] [phone]           Major procedures and Radiology Reports - PLEASE review detailed and final reports thoroughly  -       Mr Brain Wo Contrast  Result Date: 04/29/2018 CLINICAL DATA:  New onset seizure EXAM: MRI HEAD WITHOUT CONTRAST TECHNIQUE: Multiplanar, multiecho pulse sequences of the brain and surrounding structures were obtained without intravenous contrast. COMPARISON:  Head CT 04/29/2018 FINDINGS: BRAIN: The midline structures are normal. There is no acute infarct or acute hemorrhage. There is no mass lesion or other mass effect. There is no hydrocephalus, dural abnormality or extra-axial collection. The white matter signal is normal for the patient's age. Generalized atrophy without lobar predilection. No chronic microhemorrhage or superficial siderosis. VASCULAR: Major intracranial arterial and venous sinus flow voids are preserved. SKULL AND UPPER CERVICAL SPINE: The visualized skull base, calvarium, upper cervical spine and extracranial soft tissues are normal. SINUSES/ORBITS: No fluid levels or advanced mucosal thickening. No mastoid or middle ear effusion. The orbits are normal. IMPRESSION: Generalized volume loss without lobar predilection. No acute abnormality or focal finding to suggest seizure etiology. Electronically Signed   By: Cletus Gash.D.  On: 04/29/2018  17:26   Dg Chest Port 1 View  Result Date: 05/04/2018 CLINICAL DATA:  Acute respiratory failure with hypoxia EXAM: PORTABLE CHEST 1 VIEW COMPARISON:  Three days ago FINDINGS: Tracheal and esophageal extubation with clear lungs. Central line with tip at the upper cavoatrial junction. Artifact from EKG leads. Normal heart size. IMPRESSION: No evidence of active disease. Electronically Signed   By: Monte Fantasia M.D.   On: 05/04/2018 08:20   Dg Chest Port 1 View  Result Date: 05/01/2018 CLINICAL DATA:  Hypoxia EXAM: PORTABLE CHEST 1 VIEW COMPARISON:  April 30, 2018 common April 29, 2018, and March 14, 2017 FINDINGS: Endotracheal tube tip is 3.0 cm above the carina. Central catheter tip is at the cavoatrial junction. Nasogastric tube tip and side port are in the stomach. No pneumothorax. There is persistent atelectatic change in the left lower lung zone. Lungs elsewhere are clear. Heart size and pulmonary vascularity are normal. No adenopathy. No bone lesions. IMPRESSION: Persistent left lower lobe atelectatic change. A small focus of superimposed pneumonia cannot be entirely excluded in this area. Lungs elsewhere are clear. Heart size normal. Tube and catheter positions as described without pneumothorax. Electronically Signed   By: Lowella Grip III M.D.   On: 05/01/2018 08:19   Dg Chest Port 1 View  Result Date: 04/30/2018 CLINICAL DATA:  62 year old male with central line placement. Subsequent encounter. EXAM: PORTABLE CHEST 1 VIEW COMPARISON:  04/30/2018 5 a.m. FINDINGS: Endotracheal tube tip 4.3 cm above the carina. Right internal jugular catheter placed with tip cavoatrial junction level. Nasogastric tube curled within the stomach tip not imaged on present exam. Bandlike atelectasis left infrahilar region. No infiltrate, congestive heart failure or pneumothorax. Heart size within normal limits. IMPRESSION: Right central line tip caval atrial junction level. No pneumothorax. Bandlike atelectasis left  infrahilar region. Interval improved aeration left lung base. Electronically Signed   By: Genia Del M.D.   On: 04/30/2018 20:09   Dg Chest Port 1 View  Result Date: 04/30/2018 CLINICAL DATA:  Acute respiratory failure. EXAM: PORTABLE CHEST 1 VIEW COMPARISON:  04/29/2018. FINDINGS: Low lung volumes. LEFT ventricular hypertrophy. Perihilar opacities appear similar without significant improvement. ET tube 2 cm above carina, should be pulled back approximately 2 cm. Nasogastric tube in the stomach. IMPRESSION: Stable perihilar densities.  ETT too low, 2 cm above. Electronically Signed   By: Staci Righter M.D.   On: 04/30/2018 07:24   Dg Chest Port 1 View  Result Date: 04/29/2018 CLINICAL DATA:  Endotracheal tube and orogastric tube placement. EXAM: PORTABLE CHEST 1 VIEW COMPARISON:  Chest radiograph performed 11/24/2017 FINDINGS: The patient's endotracheal tube is seen ending 3 cm above the carina. An enteric tube is noted overlying the body of the stomach. The lungs are well-aerated. Right perihilar airspace opacification may reflect pneumonia. There is no evidence of pleural effusion or pneumothorax. The cardiomediastinal silhouette is within normal limits. No acute osseous abnormalities are seen. IMPRESSION: 1. Endotracheal tube seen ending 3 cm above the carina. 2. Enteric tube noted overlying the body of the stomach. 3. Right perihilar airspace opacification may reflect pneumonia. Followup PA and lateral chest X-ray is recommended in 3-4 weeks following trial of antibiotic therapy to ensure resolution and exclude underlying malignancy. Electronically Signed   By: Garald Balding M.D.   On: 04/29/2018 06:29   Ct Head Code Stroke Wo Contrast  Result Date: 04/29/2018 CLINICAL DATA:  Code stroke. LEFT facial droop, seizure. Follow-up code stroke. Last seen normal at 2100 hours.  EXAM: CT HEAD WITHOUT CONTRAST TECHNIQUE: Contiguous axial images were obtained from the base of the skull through the vertex  without intravenous contrast. COMPARISON:  MRI of the head November 27, 2017 FINDINGS: Moderately motion degraded examination. BRAIN: No intraparenchymal hemorrhage, mass effect nor midline shift. Mild parenchymal brain volume loss for age. No hydrocephalus. No acute large vascular territory infarcts. No abnormal extra-axial fluid collections. Basal cisterns are patent. VASCULAR: Mild-to-moderate calcific atherosclerosis of the carotid siphons. SKULL: No skull fracture. Focal LEFT maxillary fibrous dysplasia. No significant scalp soft tissue swelling. LEFT suboccipital scalp lipoma. Scalp calcifications. SINUSES/ORBITS: The mastoid air-cells and included paranasal sinuses are well-aerated.The included ocular globes and orbital contents are non-suspicious. OTHER: Patient is edentulous. ASPECTS [facility] Stroke Program Early CT Score) - Ganglionic level infarction (caudate, lentiform nuclei, internal capsule, insula, M1-M3 cortex): 7 - Supraganglionic infarction (M4-M6 cortex): 3 Total score (0-10 with 10 being normal): 10 IMPRESSION: 1. No acute intracranial process on this motion degraded examination. 2. Mild parenchymal brain volume loss for age. 3. ASPECTS is 10. 4. Critical Value/emergent results text paged to [place], Neurology via [facility] system on 04/29/2018 at 3:56 am, including interpreting physician's phone number. Electronically Signed   By: Elon Alas M.D.   On: 04/29/2018 03:58    Micro Results     Recent Results (from the past 240 hour(s))  Culture, blood (routine x 2)     Status: None   Collection Time: 04/29/18  6:30 AM  Result Value Ref Range Status   Specimen Description BLOOD RIGHT HAND  Final   Special Requests   Final    BOTTLES DRAWN AEROBIC AND ANAEROBIC Blood Culture adequate volume   Culture   Final    NO GROWTH 5 DAYS Performed at [facility], [address] [place], [place] [address]    Report Status 05/04/2018 FINAL  Final  Culture, blood (routine x 2)      Status: None   Collection Time: 04/29/18  6:45 AM  Result Value Ref Range Status   Specimen Description BLOOD RIGHT HAND  Final   Special Requests   Final    BOTTLES DRAWN AEROBIC AND ANAEROBIC Blood Culture results may not be optimal due to an inadequate volume of blood received in culture bottles   Culture   Final    NO GROWTH 5 DAYS Performed at [facility], [place] [address], [place], [place] [address]    Report Status 05/04/2018 FINAL  Final  MRSA PCR Screening     Status: None   Collection Time: 04/29/18  8:54 AM  Result Value Ref Range Status   MRSA by PCR NEGATIVE NEGATIVE Final    Comment:        The GeneXpert MRSA Assay (FDA approved for NASAL specimens only), is one component of a comprehensive MRSA colonization surveillance program. It is not intended to diagnose MRSA infection nor to guide or monitor treatment for MRSA infections. Performed at [facility], [place] [address] [place],  [address]     Today   Subjective    Milen Lengacher today has no headache,no chest abdominal pain,no new weakness tingling or numbness, feels much better wants to go home today.     Objective   Blood pressure 140/70, pulse 73, temperature 98.9 F (37.2 C), temperature source Oral, resp. rate 18, height 6\' 1"  (1.854 m), weight 73.2 kg (161 lb 6 oz), SpO2 99 %.   Intake/Output Summary (Last 24 hours) at 05/05/2018 1131 Last  data filed at 05/04/2018 1600 Gross per 24 hour  Intake 300 ml  Output 40 ml  Net 260 ml    Exam Awake Alert, Oriented x 3, No new F.N deficits, Normal affect [place].AT,PERRAL Supple Neck,No JVD, No cervical lymphadenopathy appriciated.  Symmetrical Chest wall movement, Good air movement bilaterally, CTAB RRR,No Gallops,Rubs or new Murmurs, No Parasternal Heave +ve B.Sounds, Abd Soft, Non tender, No organomegaly appriciated, No rebound -guarding or rigidity. No Cyanosis, Clubbing or edema, No new Rash or bruise   Data Review   CBC w Diff:    Lab Results  Component Value Date   WBC 5.9 05/05/2018   HGB 7.7 (L) 05/05/2018   HCT 24.0 (L) 05/05/2018   PLT 170 05/05/2018   LYMPHOPCT 34 04/29/2018   MONOPCT 11 04/29/2018   EOSPCT 6 04/29/2018   BASOPCT 1 04/29/2018    CMP:  Lab Results  Component Value Date   NA 135 05/05/2018   K 4.3 05/05/2018   CL 99 (L) 05/05/2018   CO2 23 05/05/2018   BUN 39 (H) 05/05/2018   CREATININE 6.66 (H) 05/05/2018   CREATININE 10.58 (H) 01/04/2017   PROT 6.4 (L) 04/29/2018   ALBUMIN 1.9 (L) 05/05/2018   BILITOT 0.9 04/29/2018   ALKPHOS 60 04/29/2018   AST 28 04/29/2018   ALT 19 04/29/2018  .   Total Time in preparing paper work, data evaluation and todays exam - 81 minutes  Lala Lund M.D on 05/05/2018 at 11:31 AM  [facility]   Office  [phone]

## 2018-05-05 NOTE — Progress Notes (Deleted)
[facility] Progress Note   Subjective:  Transferred to 5W from ICU Sitting in recliner at bedside.  Conversant, no complaints   Objective Vitals:   05/04/18 1716 05/04/18 2151 05/05/18 0539 05/05/18 0740  BP: (!) 156/76 (!) 148/75 (!) 149/76 140/70  Pulse: 82 80 78 73  Resp: 20 18 18 18   Temp:  99.1 F (37.3 C) 98.8 F (37.1 C) 98.9 F (37.2 C)  TempSrc:  Oral Oral Oral  SpO2: 98% 98% 99% 99%  Weight:      Height:       Physical Exam General: WNWD male NAD  Heart: RRR 2/3 SEM  Lungs: CTAB  Abdomen: soft NT PD cath in place  Extremities: Trace LE edema  Dialysis Access: LUE AVF +bruit  Dialysis Orders: [facility]CPD (f/b Dr Lorrene Reid) - getting HD here using L arm AVF   Assessment/Plan: 1 hypertension urgency - better on norvasc/ hydralazine/ clon/ metop 2 acute resp failure - in setting of HTN urgency and seizures 3 altered mental status - due to severe hyperglycemia and HTN urgency, much better 4 seizures - on keppra, neuro signed off 5 esrd - is on pd at home but getting HD here for now, holding pd for now.    6 volume - high bp's suggesting ^volume. Good UF here.  Net 6.5L this week. For HD Monday. 7 anemia - hgb low, Fe stores good. Resume esa with HD tomorrow  8. DM - hyperglycemic on admt   Jared [name] [facility] Pager [phone] 05/05/2018,10:42 AM  LOS: 6 days   Additional Objective Labs: Basic Metabolic Panel: Recent Labs  Lab 05/03/18 0416 05/04/18 0431 05/05/18 0349  NA 139 137 135  K 3.5 4.3 4.3  CL 102 102 99*  CO2 25 23 23   GLUCOSE 186* 201* 297*  BUN 41* 26* 39*  CREATININE 6.96* 5.10* 6.66*  CALCIUM 8.4* 7.8* 8.2*  PHOS 3.8 3.4 3.0   CBC: Recent Labs  Lab 04/29/18 0339  05/01/18 0546 05/02/18 0455 05/03/18 0416 05/04/18 0431 05/05/18 0349  WBC 4.5   < > 8.3 8.2 9.4 6.4 5.9  NEUTROABS 2.2  --   --   --   --   --   --   HGB 11.7*   < > 9.6* 9.8* 9.4* 8.3* 7.7*  HCT 36.5*   < > 28.5* 30.0*  28.6* 26.0* 24.0*  MCV 83.3   < > 78.3 81.7 81.3 82.8 84.5  PLT 160   < > 159 177 202 185 170   < > = values in this interval not displayed.   Blood Culture    Component Value Date/Time   SDES BLOOD RIGHT HAND 04/29/2018 0645   SPECREQUEST  04/29/2018 0645    BOTTLES DRAWN AEROBIC AND ANAEROBIC Blood Culture results may not be optimal due to an inadequate volume of blood received in culture bottles   CULT  04/29/2018 0645    NO GROWTH 5 DAYS Performed at [facility], [place] [address], [place], [place] [address]    REPTSTATUS 05/04/2018 FINAL 04/29/2018 0645    Cardiac Enzymes: Recent Labs  Lab 04/29/18 0837 04/29/18 1358 04/29/18 1900  TROPONINI <0.03 0.03* 0.04*   CBG: Recent Labs  Lab 05/04/18 0748 05/04/18 1139 05/04/18 1607 05/04/18 2149 05/05/18 0823  GLUCAP 132* 139* 207* 304* 362*   Iron Studies:  Recent Labs    05/05/18 0349  IRON 72  TIBC 112*  FERRITIN 1,462*   Lab Results  Component Value Date   INR 0.98 04/29/2018   INR 1.17 11/24/2016   Medications: . sodium chloride    . sodium chloride     . amLODipine  10 mg Oral QHS  . atorvastatin  20 mg Oral q1800  . calcitRIOL  0.25 mcg Oral Daily  . Chlorhexidine Gluconate Cloth  6 each Topical Q0600  . cinacalcet  30 mg Oral Q breakfast  . cloNIDine  0.3 mg Oral TID  . [START ON 05/10/2018] darbepoetin (ARANESP) injection - DIALYSIS  150 mcg Intravenous Q Fri-HD  . feeding supplement (NEPRO CARB STEADY)  237 mL Oral BID BM  . heparin  5,000 Units Subcutaneous Q8H  . hydrALAZINE  100 mg Oral TID  . insulin aspart  0-5 Units Subcutaneous QHS  . insulin aspart  0-9 Units Subcutaneous TID WC  . isosorbide mononitrate  30 mg Oral Daily  . levETIRAcetam  250 mg Oral BID  . mouth rinse  15 mL Mouth Rinse BID  . metoprolol tartrate  100 mg Oral BID  . multivitamin  1 tablet Oral QHS  . pantoprazole  40 mg Oral Daily  . sevelamer carbonate  2,400 mg Oral TID WC  . sodium chloride flush  10-40 mL  Intracatheter Q12H

## 2018-05-05 NOTE — Evaluation (Signed)
Occupational Therapy Evaluation Patient Details Name: Jared [name] MRN: [id number] DOB: 18-Jul-1956 Today's Date: 05/05/2018    History of Present Illness 62 year old male with ESRD, HTN, and DM who was admitted 04/29/18 with profound encephalopathy and seizure activity due to medical noncompliance, hypoglycemia, and hypertensive urgency.     Clinical Impression   PTA, pt was independent with all ADL, IADL and mobility. Pt currently presents with mild deconditioning, unsteadiness, and slow cognitive processing. Pt completed ADL tasks with increased time and had 2 LOB posteriorly during session, but was able to recover without physical assistance. OT discussed diabetes self-management with pt including medication management, integration of dietary recommendations for diabetic diet, potential change to hemodialysis and creating a new routine for this. Recommend continued OT services for functional mobility training during ADL/IADL tasks as well as medication management to enhance adherence and work to prevent further complications. OT to continue following acutely.    Follow Up Recommendations  No OT follow up;Supervision - Intermittent    Equipment Recommendations  None recommended by OT    Recommendations for Other Services       Precautions / Restrictions Precautions Precautions: Fall Restrictions Weight Bearing Restrictions: No      Mobility Bed Mobility Overal bed mobility: Needs Assistance Bed Mobility: Sit to Supine       Sit to supine: Supervision   General bed mobility comments: Supervision for safety d/t IV line in neck. Pt used bedrail but did not require any physical assistance  Transfers Overall transfer level: Needs assistance Equipment used: None Transfers: Sit to/from Stand Sit to Stand: Min guard         General transfer comment: Min guard assist for safety. Pt will 2 LOB backward upon standing and while standing at the sink for groomin tasks. Pt able to  recover balance without physical assistance.     Balance Overall balance assessment: Needs assistance Sitting-balance support: No upper extremity supported;Feet supported Sitting balance-Leahy Scale: Good     Standing balance support: No upper extremity supported;During functional activity Standing balance-Leahy Scale: Fair Standing balance comment: LOB x2 posteriorly                           ADL either performed or assessed with clinical judgement   ADL Overall ADL's : Needs assistance/impaired Eating/Feeding: Independent   Grooming: Wash/dry hands;Wash/dry face;Oral care;Min guard;Standing Grooming Details (indicate cue type and reason): mild LOB while standing at sink. No physical assist required Upper Body Bathing: Supervision/ safety;Sitting   Lower Body Bathing: Min guard;Sit to/from stand   Upper Body Dressing : Supervision/safety;Sitting   Lower Body Dressing: Min guard;Sit to/from stand   Toilet Transfer: Supervision/safety;Regular Toilet;Grab bars(grab bar to simulate sink counter)   Toileting- Clothing Manipulation and Hygiene: Supervision/safety;Sit to/from stand   Tub/ Shower Transfer: Tub transfer;Min guard;Ambulation   Functional mobility during ADLs: Min guard General ADL Comments: Pt's friend, [name], present for OT eval and agreed he is unsteady on his feet compared to his baseline. OT discussed diabetes self-management with pt including medication management, integration of dietary recommendations for diabetic diet, potential change to hemodialysis and creating a new routine for this. Pt has been managing diabetes for 28 years and feels he has done a good job. OT provided praise and encouraged him to continue doing so and seeking out the help of a dietician (potentially at dialysis center) to further improve his self-management of this chronic condition. Also discussed routine around insulin injections and checking blood  sugar levels - encouraged pt to  talk to MD, pharmacist, and OT for further medication management to enhance adherence.     Vision Baseline Vision/History: Wears glasses Wears Glasses: Reading only Patient Visual Report: Other (comment)(Flickering/shadows) Vision Assessment?: Vision impaired- to be further tested in functional context Additional Comments: Recommend pt follow up with ophthalmologist     Perception     Praxis      Pertinent Vitals/Pain Pain Assessment: No/denies pain     Hand Dominance Right   Extremity/Trunk Assessment Upper Extremity Assessment Upper Extremity Assessment: Overall WFL for tasks assessed   Lower Extremity Assessment Lower Extremity Assessment: Overall WFL for tasks assessed   Cervical / Trunk Assessment Cervical / Trunk Assessment: Normal   Communication Communication Communication: No difficulties   Cognition Arousal/Alertness: Awake/alert Behavior During Therapy: WFL for tasks assessed/performed Overall Cognitive Status: Impaired/Different from baseline Area of Impairment: Problem solving                             Problem Solving: Slow processing General Comments: Mildly slow processing also noted by pt's friend that was present   General Comments       Exercises     Shoulder Instructions      Home Living Family/patient expects to be discharged to:: Private residence Living Arrangements: Other relatives(Sister) Available Help at Discharge: Family;Available 24 hours/day Type of Home: House Home Access: Stairs to enter [employer] of Steps: 3 Entrance Stairs-Rails: Right Home Layout: Multi-level;Bed/bath upstairs Alternate Level Stairs-Number of Steps: 10 Alternate Level Stairs-Rails: Right;Left Bathroom Shower/Tub: Teacher, early years/pre: Standard     Home Equipment: Shower seat;Grab bars - tub/shower;Hand held shower head          Prior Functioning/Environment Level of Independence: Independent         Comments: Drives        OT Problem List: Decreased strength;Decreased activity tolerance;Impaired balance (sitting and/or standing)      OT Treatment/Interventions:      OT Goals(Current goals can be found in the care plan section) Acute Rehab OT Goals Patient Stated Goal: To get better and get back on track with his diabetes management OT Goal Formulation: With patient Time For Goal Achievement: 05/19/18 Potential to Achieve Goals: Good ADL Goals Pt Will Perform Lower Body Dressing: with modified independence;sit to/from stand Pt Will Transfer to Toilet: with modified independence;regular height toilet;ambulating Pt Will Perform Toileting - Clothing Manipulation and hygiene: with modified independence;sit to/from stand Pt Will Perform Tub/Shower Transfer: Tub transfer;with modified independence;ambulating;shower seat Additional ADL Goal #1: Pt will verbalize medication management routine including oral medications, insulin injections, and checking blood sugar with no verbal cues.  OT Frequency:     Barriers to D/C:            Co-evaluation              AM-PAC PT "6 Clicks" Daily Activity     Outcome Measure Help from another person eating meals?: None Help from another person taking care of personal grooming?: None Help from another person toileting, which includes using toliet, bedpan, or urinal?: None Help from another person bathing (including washing, rinsing, drying)?: A Little Help from another person to put on and taking off regular upper body clothing?: None Help from another person to put on and taking off regular lower body clothing?: None 6 Click Score: 23   End of Session Equipment Utilized During Treatment: Gait belt Nurse  Communication: Mobility status  Activity Tolerance: Patient tolerated treatment well Patient left: in bed;with nursing/sitter in room(to remove IV)  OT Visit Diagnosis: Unsteadiness on feet (R26.81);Other abnormalities of gait and  mobility (R26.89);Other (comment)(Vision changes)                Time: 8550-1586 OT Time Calculation (min): 57 min Charges:  OT General Charges $OT Visit: 1 Visit OT Evaluation $OT Eval Moderate Complexity: 1 Mod OT Treatments $Self Care/Home Management : 23-37 mins G-Codes:     Redmond Baseman, MS, OTR/L 05/05/2018, 12:32 PM

## 2018-05-05 NOTE — Discharge Instructions (Signed)
Do not drive, operate heavy machinery, perform activities at heights, swimming or participation in water activities or provide baby sitting services until you have seen by Primary MD or a Neurologist and advised to do so again.  Follow with Primary MD Raenette Rover, Vickie L, NP-C in 7 days   Get CBC, CMP, 2 view Chest X ray checked  by Primary MD in 5-7 days   Activity: As tolerated with Full fall precautions use walker/cane & assistance as needed  Disposition Home    Diet:   Renal, low carbohydrate diet.  1.5 L/day total fluid restriction.  Accuchecks 4 times/day, Once in AM empty stomach and then before each meal. Log in all results and show them to your Prim.MD in 3 days. If any glucose reading is under 80 or above 300 call your Prim MD immidiately. Follow Low glucose instructions for glucose under 80 as instructed.  Special Instructions: If you have smoked or chewed Tobacco  in the last 2 yrs please stop smoking, stop any regular Alcohol  and or any Recreational drug use.  On your next visit with your primary care physician please Get Medicines reviewed and adjusted.  Please request your Prim.MD to go over all Hospital Tests and Procedure/Radiological results at the follow up, please get all Hospital records sent to your Prim MD by signing hospital release before you go home.  If you experience worsening of your admission symptoms, develop shortness of breath, life threatening emergency, suicidal or homicidal thoughts you must seek medical attention immediately by calling 911 or calling your MD immediately  if symptoms less severe.  You Must read complete instructions/literature along with all the possible adverse reactions/side effects for all the Medicines you take and that have been prescribed to you. Take any new Medicines after you have completely understood and accpet all the possible adverse reactions/side effects.

## 2018-05-06 DIAGNOSIS — D649 Anemia, unspecified: Secondary | ICD-10-CM | POA: Diagnosis not present

## 2018-05-06 DIAGNOSIS — N2581 Secondary hyperparathyroidism of renal origin: Secondary | ICD-10-CM | POA: Diagnosis not present

## 2018-05-06 DIAGNOSIS — N186 End stage renal disease: Secondary | ICD-10-CM | POA: Diagnosis not present

## 2018-05-07 ENCOUNTER — Telehealth: Payer: Self-pay | Admitting: Family Medicine

## 2018-05-07 NOTE — Telephone Encounter (Signed)
Called pt for hospital follow up transition of care.  Pt states he's doing better, reconciled his current meds and he has all his meds and was not given any new meds at hospital and no changes were made he states.  Went over what to do is has any other issues and appt made for follow up.

## 2018-05-07 NOTE — Telephone Encounter (Signed)
Error

## 2018-05-08 DIAGNOSIS — T85611A Breakdown (mechanical) of intraperitoneal dialysis catheter, initial encounter: Secondary | ICD-10-CM | POA: Diagnosis not present

## 2018-05-08 DIAGNOSIS — E1022 Type 1 diabetes mellitus with diabetic chronic kidney disease: Secondary | ICD-10-CM | POA: Diagnosis not present

## 2018-05-08 DIAGNOSIS — T85611D Breakdown (mechanical) of intraperitoneal dialysis catheter, subsequent encounter: Secondary | ICD-10-CM | POA: Diagnosis not present

## 2018-05-08 DIAGNOSIS — N185 Chronic kidney disease, stage 5: Secondary | ICD-10-CM | POA: Diagnosis not present

## 2018-05-09 DIAGNOSIS — N2581 Secondary hyperparathyroidism of renal origin: Secondary | ICD-10-CM | POA: Diagnosis not present

## 2018-05-09 DIAGNOSIS — D649 Anemia, unspecified: Secondary | ICD-10-CM | POA: Diagnosis not present

## 2018-05-09 DIAGNOSIS — N186 End stage renal disease: Secondary | ICD-10-CM | POA: Diagnosis not present

## 2018-05-10 ENCOUNTER — Inpatient Hospital Stay: Payer: Medicare Other | Admitting: Family Medicine

## 2018-05-10 ENCOUNTER — Ambulatory Visit (INDEPENDENT_AMBULATORY_CARE_PROVIDER_SITE_OTHER): Payer: Medicare Other | Admitting: Family Medicine

## 2018-05-10 ENCOUNTER — Encounter: Payer: Self-pay | Admitting: Family Medicine

## 2018-05-10 VITALS — BP 130/80 | HR 67 | Temp 97.7°F | Resp 16 | Wt 161.0 lb

## 2018-05-10 DIAGNOSIS — E1142 Type 2 diabetes mellitus with diabetic polyneuropathy: Secondary | ICD-10-CM

## 2018-05-10 DIAGNOSIS — N186 End stage renal disease: Secondary | ICD-10-CM

## 2018-05-10 DIAGNOSIS — I10 Essential (primary) hypertension: Secondary | ICD-10-CM | POA: Diagnosis not present

## 2018-05-10 DIAGNOSIS — Z09 Encounter for follow-up examination after completed treatment for conditions other than malignant neoplasm: Secondary | ICD-10-CM

## 2018-05-10 DIAGNOSIS — Z4902 Encounter for fitting and adjustment of peritoneal dialysis catheter: Secondary | ICD-10-CM | POA: Diagnosis not present

## 2018-05-10 DIAGNOSIS — Z992 Dependence on renal dialysis: Secondary | ICD-10-CM

## 2018-05-10 DIAGNOSIS — Z01818 Encounter for other preprocedural examination: Secondary | ICD-10-CM | POA: Diagnosis not present

## 2018-05-10 NOTE — Patient Instructions (Signed)
Continue checking your blood sugar at least 4 times per day. If you are seeing readings higher than 300 on more than on occasional then you need to be seen right away.  If you are seeing readings lower than 70 then you should not take your next dose of insulin and make sure you eat.   Go to [facility] and get your chest XR. The order is in the computer.   Please make sure you get a CBC and CMP blood test tomorrow and have them send me the results.   Avoid driving for now.   Call [facility] and ask to be seen by a different provider. [phone]  Follow up with me in 4 weeks.

## 2018-05-10 NOTE — Progress Notes (Signed)
Subjective:    Patient ID: Jared [name], male    DOB: [birth year], 62 y.o.   MRN: [id number]  HPI Chief Complaint  Patient presents with  . hospital follow-up    hospital follow-up for siezure. needs follow-up on cbc, cmp  and 2 view chest xray   He is a 62 year old male with a history of ESRD now on HD, HTN, brittle diabetes, here for a hospital discharge follow up. His sister is with him.   He went to the ED on 04/29/2018 with altered mental status and code stroke evaluation. He was found to be hyperglycemic and having seizure activity. He was having hypertensive urgency as well.  Today he reports feeling back to his baseline. States he feels "fine".   He reports checking his blood sugar "9 times per day". Does not have his readings with him.  States FBS this morning 101, 2 hours after lunch BS 139.  Last Hgb A1c 6.8% per hospital discharge note.   States he has had no blood sugars <90 and his highest has been 270.  He has been under the care of Dr. Loanne Drilling for diabetes but has not been back to see him in several months. States he would like to switch to a different endocrinologist. He is interested in getting a glucose monitoring system such as a freestyle libre. He has heard about this.   Denies fever, chills, dizziness, headache, vision changes, chest pain, palpitations, shortness of breath, abdominal pain, N/V/D, urinary symptoms, LE edema.  No increased thirst.  Denies numbness, tingling or focal weakness.    No more seizure activity. He has not driven but states he plans to start back driving soon.  Dialysis schedule is T, TH, Saturday  States he is having labs drawn tomorrow and is not going to have blood work done here today.   Past Medical History:  Diagnosis Date  . Anemia   . Cancer [facility])    early prostate cancer per patient  . Chronic kidney disease    on dialysis M-W-F  . Diabetes mellitus without complication ([place])    onset as adult  . GERD  (gastroesophageal reflux disease)   . Hypertension   . Seizures ([place])    pt states he thinks this is bc of his blood sugar  . Stroke [facility])    patient denies  . Thyroid disease    hyperparathyroidism   Past Surgical History:  Procedure Laterality Date  . AV FISTULA PLACEMENT Left 06/04/2014   Procedure: ARTERIOVENOUS (AV) FISTULA CREATION-  BRACHIOCEPHALIC WITH LIGATION OF COMPETEING BRANCH;  Surgeon: Mal Misty, MD;  Location: [place];  Service: Vascular;  Laterality: Left;  . INGUINAL HERNIA REPAIR Left 02/19/2018   Procedure: LAPAROSCOPIC LEFT  INGUINAL HERNIA;  Surgeon: Ralene Ok, MD;  Location: [place];  Service: General;  Laterality: Left;  . INSERTION OF DIALYSIS CATHETER  04/2014  . INSERTION OF MESH Left 02/19/2018   Procedure: INSERTION OF MESH;  Surgeon: Ralene Ok, MD;  Location: [place];  Service: General;  Laterality: Left;  . IR GENERIC HISTORICAL Left 11/30/2016   IR THROMBECTOMY AV FISTULA W/THROMBOLYSIS/PTA/STENT INC/SHUNT/IMG LT 11/30/2016 Markus Daft, MD [facility]-INTERV RAD  . IR GENERIC HISTORICAL  11/30/2016   IR US GUIDE VASC ACCESS LEFT 11/30/2016 Markus Daft, MD [facility]   Current Outpatient Medications on File Prior to Visit  Medication Sig Dispense Refill  . amLODipine (NORVASC) 10 MG tablet Take 1 tablet (10 mg total) by mouth daily. 30 tablet  0  . atorvastatin (LIPITOR) 20 MG tablet Take 1 tablet (20 mg total) by mouth daily. 90 tablet 3  . cinacalcet (SENSIPAR) 30 MG tablet Take 30 mg by mouth daily.  3  . cloNIDine (CATAPRES) 0.3 MG tablet Take 0.3 mg by mouth 2 (two) times daily. as directed  5  . fluticasone (FLONASE) 50 MCG/ACT nasal spray Place 1 spray into both nostrils daily. (Patient taking differently: Place 1 spray into both nostrils daily as needed for allergies. ) 16 g 2  . hydrALAZINE (APRESOLINE) 100 MG tablet Take 1 tablet (100 mg total) by mouth 3 (three) times daily. 90 tablet 0  . insulin NPH Human (NOVOLIN N) 100 UNIT/ML injection Inject 20  units every morning (Patient taking differently: Inject 30 Units into the skin daily before breakfast. ) 10 mL 5  . isosorbide mononitrate (IMDUR) 30 MG 24 hr tablet TAKE 1 TABLET BY MOUTH ONCE DAILY 90 tablet 1  . levETIRAcetam (KEPPRA) 250 MG tablet Take 1 tablet (250 mg total) by mouth 2 (two) times daily. 60 tablet 5  . metoprolol tartrate (LOPRESSOR) 100 MG tablet Take 1 tablet (100 mg total) by mouth 2 (two) times daily. 60 tablet 1  . pantoprazole (PROTONIX) 40 MG tablet TAKE 1 TABLET BY MOUTH ONCE DAILY AT 6AM (Patient taking differently: TAKE 1 TABLET BY MOUTH twice DAILy) 90 tablet 1  . sevelamer carbonate (RENVELA) 800 MG tablet Take 3 tablets (2,400 mg total) by mouth 3 (three) times daily with meals. 240 tablet 1  . docusate sodium (COLACE) 100 MG capsule Take 1 capsule (100 mg total) by mouth every 12 (twelve) hours. (Patient not taking: Reported on 05/10/2018) 60 capsule 0   No current facility-administered medications on file prior to visit.    Social History   Socioeconomic History  . Marital status: Divorced    Spouse name: Not on file  . Number of children: Not on file  . Years of education: Not on file  . Highest education level: Not on file  Occupational History  . Not on file  Social Needs  . Financial resource strain: Not on file  . Food insecurity:    Worry: Not on file    Inability: Not on file  . Transportation needs:    Medical: Not on file    Non-medical: Not on file  Tobacco Use  . Smoking status: Former Smoker    Last attempt to quit: 05/11/1984    Years since quitting: 34.0  . Smokeless tobacco: Never Used  Substance and Sexual Activity  . Alcohol use: Yes    Alcohol/week: 0.6 oz    Types: 1 Glasses of wine per week    Comment: occasional  . Drug use: No  . Sexual activity: Not on file  Lifestyle  . Physical activity:    Days per week: Not on file    Minutes per session: Not on file  . Stress: Not on file  Relationships  . Social connections:     Talks on phone: Not on file    Gets together: Not on file    Attends religious service: Not on file    Active member of club or organization: Not on file    Attends meetings of clubs or organizations: Not on file    Relationship status: Not on file  . Intimate partner violence:    Fear of current or ex partner: Not on file    Emotionally abused: Not on file    Physically abused:  Not on file    Forced sexual activity: Not on file  Other Topics Concern  . Not on file  Social History Narrative  . Not on file        Review of Systems Pertinent positives and negatives in the history of present illness.     Objective:   Physical Exam  Constitutional: He is oriented to person, place, and time. Vital signs are normal. He appears well-nourished. He does not have a sickly appearance. No distress.  Ambulating with a cane  HENT:  Mouth/Throat: Uvula is midline, oropharynx is clear and moist and mucous membranes are normal.  Eyes: Pupils are equal, round, and reactive to light. Conjunctivae, EOM and lids are normal.  Neck: Trachea normal and full passive range of motion without pain. Neck supple. No JVD present.  Cardiovascular: Normal rate, regular rhythm and normal pulses. Exam reveals no gallop and no friction rub.  No murmur heard. No LE edema   Pulmonary/Chest: Effort normal and breath sounds normal.  Musculoskeletal: Normal range of motion.  Neurological: He is alert and oriented to person, place, and time. No cranial nerve deficit or sensory deficit.  Skin: Skin is warm and dry. No pallor.  Psychiatric: He has a normal mood and affect. His speech is normal.   BP 130/80   Pulse 67   Temp 97.7 F (36.5 C) (Oral)   Resp 16   Wt 161 lb (73 kg)   SpO2 98%   BMI 21.24 kg/m       Assessment & Plan:  Hospital discharge follow-up - Plan: DG Chest 2 View  Type 2 diabetes mellitus with peripheral neuropathy (HCC)  Essential hypertension  End-stage renal disease on  peritoneal dialysis [facility])  Reviewed medication list with patient and his sister who is staying with him currently. Also reviewed hospital discharge summary along with MRI, CT, XR and lab results.  Encouraged him to keep a close eye on his blood sugars and he will call [facility] and schedule an appointment.  He appears to be back to baseline mental status per sister.  Encouraged him to not drive. He is adamant that he will start driving soon.  BP is in goal range.  Refuses labs today. States he will get labs checked tomorrow at dialysis and have them send me the results.  Chest XR ordered to follow up.  Spent at least 45 minutes with patient face to face and at more than 50% was in counseling and coordination of care.

## 2018-05-11 DIAGNOSIS — N2581 Secondary hyperparathyroidism of renal origin: Secondary | ICD-10-CM | POA: Diagnosis not present

## 2018-05-11 DIAGNOSIS — D649 Anemia, unspecified: Secondary | ICD-10-CM | POA: Diagnosis not present

## 2018-05-11 DIAGNOSIS — N186 End stage renal disease: Secondary | ICD-10-CM | POA: Diagnosis not present

## 2018-05-13 ENCOUNTER — Other Ambulatory Visit: Payer: Self-pay

## 2018-05-13 DIAGNOSIS — R9431 Abnormal electrocardiogram [ECG] [EKG]: Secondary | ICD-10-CM | POA: Diagnosis not present

## 2018-05-13 DIAGNOSIS — N186 End stage renal disease: Secondary | ICD-10-CM | POA: Diagnosis not present

## 2018-05-13 DIAGNOSIS — D649 Anemia, unspecified: Secondary | ICD-10-CM | POA: Diagnosis not present

## 2018-05-13 DIAGNOSIS — E785 Hyperlipidemia, unspecified: Principal | ICD-10-CM

## 2018-05-13 DIAGNOSIS — E1169 Type 2 diabetes mellitus with other specified complication: Secondary | ICD-10-CM

## 2018-05-13 DIAGNOSIS — N2581 Secondary hyperparathyroidism of renal origin: Secondary | ICD-10-CM | POA: Diagnosis not present

## 2018-05-13 MED ORDER — ATORVASTATIN CALCIUM 20 MG PO TABS: 20.0000 mg | ORAL_TABLET | Freq: Every day | ORAL | 0 refills | Status: DC

## 2018-05-13 NOTE — Telephone Encounter (Signed)
Please refill for him. 3 months.

## 2018-05-13 NOTE — Telephone Encounter (Signed)
Pt sister called stating pt cannot fins 2 bottle of atorvastatin. She stated she was told to call the office if they could not find the bottle so that a new prescription could be written. Please advise patient.

## 2018-05-14 DIAGNOSIS — N186 End stage renal disease: Secondary | ICD-10-CM | POA: Diagnosis not present

## 2018-05-14 DIAGNOSIS — Z992 Dependence on renal dialysis: Secondary | ICD-10-CM | POA: Diagnosis not present

## 2018-05-14 DIAGNOSIS — Z8546 Personal history of malignant neoplasm of prostate: Secondary | ICD-10-CM | POA: Diagnosis not present

## 2018-05-14 DIAGNOSIS — R569 Unspecified convulsions: Secondary | ICD-10-CM | POA: Diagnosis not present

## 2018-05-14 DIAGNOSIS — Z4902 Encounter for fitting and adjustment of peritoneal dialysis catheter: Secondary | ICD-10-CM | POA: Diagnosis not present

## 2018-05-14 DIAGNOSIS — E1165 Type 2 diabetes mellitus with hyperglycemia: Secondary | ICD-10-CM | POA: Diagnosis not present

## 2018-05-14 DIAGNOSIS — Z794 Long term (current) use of insulin: Secondary | ICD-10-CM | POA: Diagnosis not present

## 2018-05-14 DIAGNOSIS — I129 Hypertensive chronic kidney disease with stage 1 through stage 4 chronic kidney disease, or unspecified chronic kidney disease: Secondary | ICD-10-CM | POA: Diagnosis not present

## 2018-05-14 DIAGNOSIS — E1122 Type 2 diabetes mellitus with diabetic chronic kidney disease: Secondary | ICD-10-CM | POA: Diagnosis not present

## 2018-05-14 DIAGNOSIS — T85611A Breakdown (mechanical) of intraperitoneal dialysis catheter, initial encounter: Secondary | ICD-10-CM | POA: Diagnosis not present

## 2018-05-15 ENCOUNTER — Inpatient Hospital Stay: Payer: Medicare Other | Admitting: Family Medicine

## 2018-05-16 DIAGNOSIS — D649 Anemia, unspecified: Secondary | ICD-10-CM | POA: Diagnosis not present

## 2018-05-16 DIAGNOSIS — N2581 Secondary hyperparathyroidism of renal origin: Secondary | ICD-10-CM | POA: Diagnosis not present

## 2018-05-16 DIAGNOSIS — N186 End stage renal disease: Secondary | ICD-10-CM | POA: Diagnosis not present

## 2018-05-18 DIAGNOSIS — N186 End stage renal disease: Secondary | ICD-10-CM | POA: Diagnosis not present

## 2018-05-18 DIAGNOSIS — N2581 Secondary hyperparathyroidism of renal origin: Secondary | ICD-10-CM | POA: Diagnosis not present

## 2018-05-18 DIAGNOSIS — D649 Anemia, unspecified: Secondary | ICD-10-CM | POA: Diagnosis not present

## 2018-05-21 DIAGNOSIS — N186 End stage renal disease: Secondary | ICD-10-CM | POA: Diagnosis not present

## 2018-05-21 DIAGNOSIS — N2581 Secondary hyperparathyroidism of renal origin: Secondary | ICD-10-CM | POA: Diagnosis not present

## 2018-05-21 DIAGNOSIS — D649 Anemia, unspecified: Secondary | ICD-10-CM | POA: Diagnosis not present

## 2018-05-23 DIAGNOSIS — D649 Anemia, unspecified: Secondary | ICD-10-CM | POA: Diagnosis not present

## 2018-05-23 DIAGNOSIS — N186 End stage renal disease: Secondary | ICD-10-CM | POA: Diagnosis not present

## 2018-05-23 DIAGNOSIS — N2581 Secondary hyperparathyroidism of renal origin: Secondary | ICD-10-CM | POA: Diagnosis not present

## 2018-05-25 DIAGNOSIS — D649 Anemia, unspecified: Secondary | ICD-10-CM | POA: Diagnosis not present

## 2018-05-25 DIAGNOSIS — N186 End stage renal disease: Secondary | ICD-10-CM | POA: Diagnosis not present

## 2018-05-25 DIAGNOSIS — N2581 Secondary hyperparathyroidism of renal origin: Secondary | ICD-10-CM | POA: Diagnosis not present

## 2018-05-27 DIAGNOSIS — Z992 Dependence on renal dialysis: Secondary | ICD-10-CM | POA: Diagnosis not present

## 2018-05-27 DIAGNOSIS — N186 End stage renal disease: Secondary | ICD-10-CM | POA: Diagnosis not present

## 2018-05-27 DIAGNOSIS — I129 Hypertensive chronic kidney disease with stage 1 through stage 4 chronic kidney disease, or unspecified chronic kidney disease: Secondary | ICD-10-CM | POA: Diagnosis not present

## 2018-05-28 DIAGNOSIS — N2581 Secondary hyperparathyroidism of renal origin: Secondary | ICD-10-CM | POA: Diagnosis not present

## 2018-05-28 DIAGNOSIS — E1122 Type 2 diabetes mellitus with diabetic chronic kidney disease: Secondary | ICD-10-CM | POA: Diagnosis not present

## 2018-05-28 DIAGNOSIS — N186 End stage renal disease: Secondary | ICD-10-CM | POA: Diagnosis not present

## 2018-05-28 DIAGNOSIS — D649 Anemia, unspecified: Secondary | ICD-10-CM | POA: Diagnosis not present

## 2018-05-30 DIAGNOSIS — N2581 Secondary hyperparathyroidism of renal origin: Secondary | ICD-10-CM | POA: Diagnosis not present

## 2018-05-30 DIAGNOSIS — N186 End stage renal disease: Secondary | ICD-10-CM | POA: Diagnosis not present

## 2018-05-30 DIAGNOSIS — E1122 Type 2 diabetes mellitus with diabetic chronic kidney disease: Secondary | ICD-10-CM | POA: Diagnosis not present

## 2018-05-30 DIAGNOSIS — D649 Anemia, unspecified: Secondary | ICD-10-CM | POA: Diagnosis not present

## 2018-06-01 DIAGNOSIS — N186 End stage renal disease: Secondary | ICD-10-CM | POA: Diagnosis not present

## 2018-06-01 DIAGNOSIS — D649 Anemia, unspecified: Secondary | ICD-10-CM | POA: Diagnosis not present

## 2018-06-01 DIAGNOSIS — N2581 Secondary hyperparathyroidism of renal origin: Secondary | ICD-10-CM | POA: Diagnosis not present

## 2018-06-01 DIAGNOSIS — E1122 Type 2 diabetes mellitus with diabetic chronic kidney disease: Secondary | ICD-10-CM | POA: Diagnosis not present

## 2018-06-04 DIAGNOSIS — N186 End stage renal disease: Secondary | ICD-10-CM | POA: Diagnosis not present

## 2018-06-04 DIAGNOSIS — N2581 Secondary hyperparathyroidism of renal origin: Secondary | ICD-10-CM | POA: Diagnosis not present

## 2018-06-04 DIAGNOSIS — E1122 Type 2 diabetes mellitus with diabetic chronic kidney disease: Secondary | ICD-10-CM | POA: Diagnosis not present

## 2018-06-04 DIAGNOSIS — D649 Anemia, unspecified: Secondary | ICD-10-CM | POA: Diagnosis not present

## 2018-06-05 DIAGNOSIS — N186 End stage renal disease: Secondary | ICD-10-CM | POA: Diagnosis not present

## 2018-06-05 DIAGNOSIS — N2581 Secondary hyperparathyroidism of renal origin: Secondary | ICD-10-CM | POA: Diagnosis not present

## 2018-06-05 DIAGNOSIS — D649 Anemia, unspecified: Secondary | ICD-10-CM | POA: Diagnosis not present

## 2018-06-05 DIAGNOSIS — E1122 Type 2 diabetes mellitus with diabetic chronic kidney disease: Secondary | ICD-10-CM | POA: Diagnosis not present

## 2018-06-06 ENCOUNTER — Telehealth: Payer: Self-pay

## 2018-06-06 DIAGNOSIS — N2581 Secondary hyperparathyroidism of renal origin: Secondary | ICD-10-CM | POA: Diagnosis not present

## 2018-06-06 DIAGNOSIS — E1122 Type 2 diabetes mellitus with diabetic chronic kidney disease: Secondary | ICD-10-CM | POA: Diagnosis not present

## 2018-06-06 DIAGNOSIS — D649 Anemia, unspecified: Secondary | ICD-10-CM | POA: Diagnosis not present

## 2018-06-06 DIAGNOSIS — N186 End stage renal disease: Secondary | ICD-10-CM | POA: Diagnosis not present

## 2018-06-06 NOTE — Telephone Encounter (Signed)
CALLED PT TO MAKE AN APPT FOR AWV . NO ANSWER LVM FOR PT TO CALL BACK [facility]

## 2018-06-08 DIAGNOSIS — N186 End stage renal disease: Secondary | ICD-10-CM | POA: Diagnosis not present

## 2018-06-08 DIAGNOSIS — E1122 Type 2 diabetes mellitus with diabetic chronic kidney disease: Secondary | ICD-10-CM | POA: Diagnosis not present

## 2018-06-08 DIAGNOSIS — N2581 Secondary hyperparathyroidism of renal origin: Secondary | ICD-10-CM | POA: Diagnosis not present

## 2018-06-08 DIAGNOSIS — D649 Anemia, unspecified: Secondary | ICD-10-CM | POA: Diagnosis not present

## 2018-06-11 DIAGNOSIS — E1122 Type 2 diabetes mellitus with diabetic chronic kidney disease: Secondary | ICD-10-CM | POA: Diagnosis not present

## 2018-06-11 DIAGNOSIS — N186 End stage renal disease: Secondary | ICD-10-CM | POA: Diagnosis not present

## 2018-06-11 DIAGNOSIS — N2581 Secondary hyperparathyroidism of renal origin: Secondary | ICD-10-CM | POA: Diagnosis not present

## 2018-06-11 DIAGNOSIS — D649 Anemia, unspecified: Secondary | ICD-10-CM | POA: Diagnosis not present

## 2018-06-13 DIAGNOSIS — N2581 Secondary hyperparathyroidism of renal origin: Secondary | ICD-10-CM | POA: Diagnosis not present

## 2018-06-13 DIAGNOSIS — N186 End stage renal disease: Secondary | ICD-10-CM | POA: Diagnosis not present

## 2018-06-13 DIAGNOSIS — E1122 Type 2 diabetes mellitus with diabetic chronic kidney disease: Secondary | ICD-10-CM | POA: Diagnosis not present

## 2018-06-13 DIAGNOSIS — D649 Anemia, unspecified: Secondary | ICD-10-CM | POA: Diagnosis not present

## 2018-06-15 DIAGNOSIS — D649 Anemia, unspecified: Secondary | ICD-10-CM | POA: Diagnosis not present

## 2018-06-15 DIAGNOSIS — N2581 Secondary hyperparathyroidism of renal origin: Secondary | ICD-10-CM | POA: Diagnosis not present

## 2018-06-15 DIAGNOSIS — E1122 Type 2 diabetes mellitus with diabetic chronic kidney disease: Secondary | ICD-10-CM | POA: Diagnosis not present

## 2018-06-15 DIAGNOSIS — N186 End stage renal disease: Secondary | ICD-10-CM | POA: Diagnosis not present

## 2018-06-18 DIAGNOSIS — N186 End stage renal disease: Secondary | ICD-10-CM | POA: Diagnosis not present

## 2018-06-18 DIAGNOSIS — N2581 Secondary hyperparathyroidism of renal origin: Secondary | ICD-10-CM | POA: Diagnosis not present

## 2018-06-18 DIAGNOSIS — D649 Anemia, unspecified: Secondary | ICD-10-CM | POA: Diagnosis not present

## 2018-06-18 DIAGNOSIS — E1122 Type 2 diabetes mellitus with diabetic chronic kidney disease: Secondary | ICD-10-CM | POA: Diagnosis not present

## 2018-06-20 DIAGNOSIS — N2581 Secondary hyperparathyroidism of renal origin: Secondary | ICD-10-CM | POA: Diagnosis not present

## 2018-06-20 DIAGNOSIS — D649 Anemia, unspecified: Secondary | ICD-10-CM | POA: Diagnosis not present

## 2018-06-20 DIAGNOSIS — N186 End stage renal disease: Secondary | ICD-10-CM | POA: Diagnosis not present

## 2018-06-20 DIAGNOSIS — E1122 Type 2 diabetes mellitus with diabetic chronic kidney disease: Secondary | ICD-10-CM | POA: Diagnosis not present

## 2018-06-22 DIAGNOSIS — E1122 Type 2 diabetes mellitus with diabetic chronic kidney disease: Secondary | ICD-10-CM | POA: Diagnosis not present

## 2018-06-22 DIAGNOSIS — D649 Anemia, unspecified: Secondary | ICD-10-CM | POA: Diagnosis not present

## 2018-06-22 DIAGNOSIS — N2581 Secondary hyperparathyroidism of renal origin: Secondary | ICD-10-CM | POA: Diagnosis not present

## 2018-06-22 DIAGNOSIS — N186 End stage renal disease: Secondary | ICD-10-CM | POA: Diagnosis not present

## 2018-06-25 DIAGNOSIS — E1122 Type 2 diabetes mellitus with diabetic chronic kidney disease: Secondary | ICD-10-CM | POA: Diagnosis not present

## 2018-06-25 DIAGNOSIS — N2581 Secondary hyperparathyroidism of renal origin: Secondary | ICD-10-CM | POA: Diagnosis not present

## 2018-06-25 DIAGNOSIS — N186 End stage renal disease: Secondary | ICD-10-CM | POA: Diagnosis not present

## 2018-06-25 DIAGNOSIS — D649 Anemia, unspecified: Secondary | ICD-10-CM | POA: Diagnosis not present

## 2018-06-27 DIAGNOSIS — E1122 Type 2 diabetes mellitus with diabetic chronic kidney disease: Secondary | ICD-10-CM | POA: Diagnosis not present

## 2018-06-27 DIAGNOSIS — D509 Iron deficiency anemia, unspecified: Secondary | ICD-10-CM | POA: Diagnosis not present

## 2018-06-27 DIAGNOSIS — Z23 Encounter for immunization: Secondary | ICD-10-CM | POA: Diagnosis not present

## 2018-06-27 DIAGNOSIS — I129 Hypertensive chronic kidney disease with stage 1 through stage 4 chronic kidney disease, or unspecified chronic kidney disease: Secondary | ICD-10-CM | POA: Diagnosis not present

## 2018-06-27 DIAGNOSIS — Z992 Dependence on renal dialysis: Secondary | ICD-10-CM | POA: Diagnosis not present

## 2018-06-27 DIAGNOSIS — N2581 Secondary hyperparathyroidism of renal origin: Secondary | ICD-10-CM | POA: Diagnosis not present

## 2018-06-27 DIAGNOSIS — N186 End stage renal disease: Secondary | ICD-10-CM | POA: Diagnosis not present

## 2018-06-27 DIAGNOSIS — D649 Anemia, unspecified: Secondary | ICD-10-CM | POA: Diagnosis not present

## 2018-06-29 DIAGNOSIS — D509 Iron deficiency anemia, unspecified: Secondary | ICD-10-CM | POA: Diagnosis not present

## 2018-06-29 DIAGNOSIS — N2581 Secondary hyperparathyroidism of renal origin: Secondary | ICD-10-CM | POA: Diagnosis not present

## 2018-06-29 DIAGNOSIS — D649 Anemia, unspecified: Secondary | ICD-10-CM | POA: Diagnosis not present

## 2018-06-29 DIAGNOSIS — Z23 Encounter for immunization: Secondary | ICD-10-CM | POA: Diagnosis not present

## 2018-06-29 DIAGNOSIS — N186 End stage renal disease: Secondary | ICD-10-CM | POA: Diagnosis not present

## 2018-06-29 DIAGNOSIS — E1122 Type 2 diabetes mellitus with diabetic chronic kidney disease: Secondary | ICD-10-CM | POA: Diagnosis not present

## 2018-07-02 DIAGNOSIS — D649 Anemia, unspecified: Secondary | ICD-10-CM | POA: Diagnosis not present

## 2018-07-02 DIAGNOSIS — E1122 Type 2 diabetes mellitus with diabetic chronic kidney disease: Secondary | ICD-10-CM | POA: Diagnosis not present

## 2018-07-02 DIAGNOSIS — Z23 Encounter for immunization: Secondary | ICD-10-CM | POA: Diagnosis not present

## 2018-07-02 DIAGNOSIS — N186 End stage renal disease: Secondary | ICD-10-CM | POA: Diagnosis not present

## 2018-07-02 DIAGNOSIS — N2581 Secondary hyperparathyroidism of renal origin: Secondary | ICD-10-CM | POA: Diagnosis not present

## 2018-07-02 DIAGNOSIS — D509 Iron deficiency anemia, unspecified: Secondary | ICD-10-CM | POA: Diagnosis not present

## 2018-07-04 DIAGNOSIS — N2581 Secondary hyperparathyroidism of renal origin: Secondary | ICD-10-CM | POA: Diagnosis not present

## 2018-07-04 DIAGNOSIS — N186 End stage renal disease: Secondary | ICD-10-CM | POA: Diagnosis not present

## 2018-07-04 DIAGNOSIS — E1122 Type 2 diabetes mellitus with diabetic chronic kidney disease: Secondary | ICD-10-CM | POA: Diagnosis not present

## 2018-07-04 DIAGNOSIS — D509 Iron deficiency anemia, unspecified: Secondary | ICD-10-CM | POA: Diagnosis not present

## 2018-07-04 DIAGNOSIS — Z23 Encounter for immunization: Secondary | ICD-10-CM | POA: Diagnosis not present

## 2018-07-04 DIAGNOSIS — D649 Anemia, unspecified: Secondary | ICD-10-CM | POA: Diagnosis not present

## 2018-07-05 DIAGNOSIS — E1122 Type 2 diabetes mellitus with diabetic chronic kidney disease: Secondary | ICD-10-CM | POA: Diagnosis not present

## 2018-07-05 DIAGNOSIS — D509 Iron deficiency anemia, unspecified: Secondary | ICD-10-CM | POA: Diagnosis not present

## 2018-07-05 DIAGNOSIS — N186 End stage renal disease: Secondary | ICD-10-CM | POA: Diagnosis not present

## 2018-07-05 DIAGNOSIS — D649 Anemia, unspecified: Secondary | ICD-10-CM | POA: Diagnosis not present

## 2018-07-05 DIAGNOSIS — N2581 Secondary hyperparathyroidism of renal origin: Secondary | ICD-10-CM | POA: Diagnosis not present

## 2018-07-05 DIAGNOSIS — Z23 Encounter for immunization: Secondary | ICD-10-CM | POA: Diagnosis not present

## 2018-07-08 DIAGNOSIS — E1122 Type 2 diabetes mellitus with diabetic chronic kidney disease: Secondary | ICD-10-CM | POA: Diagnosis not present

## 2018-07-08 DIAGNOSIS — D649 Anemia, unspecified: Secondary | ICD-10-CM | POA: Diagnosis not present

## 2018-07-08 DIAGNOSIS — D509 Iron deficiency anemia, unspecified: Secondary | ICD-10-CM | POA: Diagnosis not present

## 2018-07-08 DIAGNOSIS — N2581 Secondary hyperparathyroidism of renal origin: Secondary | ICD-10-CM | POA: Diagnosis not present

## 2018-07-08 DIAGNOSIS — Z23 Encounter for immunization: Secondary | ICD-10-CM | POA: Diagnosis not present

## 2018-07-08 DIAGNOSIS — N186 End stage renal disease: Secondary | ICD-10-CM | POA: Diagnosis not present

## 2018-07-09 DIAGNOSIS — D509 Iron deficiency anemia, unspecified: Secondary | ICD-10-CM | POA: Diagnosis not present

## 2018-07-09 DIAGNOSIS — E1122 Type 2 diabetes mellitus with diabetic chronic kidney disease: Secondary | ICD-10-CM | POA: Diagnosis not present

## 2018-07-09 DIAGNOSIS — D649 Anemia, unspecified: Secondary | ICD-10-CM | POA: Diagnosis not present

## 2018-07-09 DIAGNOSIS — N2581 Secondary hyperparathyroidism of renal origin: Secondary | ICD-10-CM | POA: Diagnosis not present

## 2018-07-09 DIAGNOSIS — Z23 Encounter for immunization: Secondary | ICD-10-CM | POA: Diagnosis not present

## 2018-07-09 DIAGNOSIS — N186 End stage renal disease: Secondary | ICD-10-CM | POA: Diagnosis not present

## 2018-07-11 DIAGNOSIS — N2581 Secondary hyperparathyroidism of renal origin: Secondary | ICD-10-CM | POA: Diagnosis not present

## 2018-07-11 DIAGNOSIS — D649 Anemia, unspecified: Secondary | ICD-10-CM | POA: Diagnosis not present

## 2018-07-11 DIAGNOSIS — D509 Iron deficiency anemia, unspecified: Secondary | ICD-10-CM | POA: Diagnosis not present

## 2018-07-11 DIAGNOSIS — Z23 Encounter for immunization: Secondary | ICD-10-CM | POA: Diagnosis not present

## 2018-07-11 DIAGNOSIS — N186 End stage renal disease: Secondary | ICD-10-CM | POA: Diagnosis not present

## 2018-07-11 DIAGNOSIS — E1122 Type 2 diabetes mellitus with diabetic chronic kidney disease: Secondary | ICD-10-CM | POA: Diagnosis not present

## 2018-07-13 DIAGNOSIS — D509 Iron deficiency anemia, unspecified: Secondary | ICD-10-CM | POA: Diagnosis not present

## 2018-07-13 DIAGNOSIS — D649 Anemia, unspecified: Secondary | ICD-10-CM | POA: Diagnosis not present

## 2018-07-13 DIAGNOSIS — E1122 Type 2 diabetes mellitus with diabetic chronic kidney disease: Secondary | ICD-10-CM | POA: Diagnosis not present

## 2018-07-13 DIAGNOSIS — N2581 Secondary hyperparathyroidism of renal origin: Secondary | ICD-10-CM | POA: Diagnosis not present

## 2018-07-13 DIAGNOSIS — Z23 Encounter for immunization: Secondary | ICD-10-CM | POA: Diagnosis not present

## 2018-07-13 DIAGNOSIS — N186 End stage renal disease: Secondary | ICD-10-CM | POA: Diagnosis not present

## 2018-07-16 DIAGNOSIS — N2581 Secondary hyperparathyroidism of renal origin: Secondary | ICD-10-CM | POA: Diagnosis not present

## 2018-07-16 DIAGNOSIS — N186 End stage renal disease: Secondary | ICD-10-CM | POA: Diagnosis not present

## 2018-07-16 DIAGNOSIS — D509 Iron deficiency anemia, unspecified: Secondary | ICD-10-CM | POA: Diagnosis not present

## 2018-07-16 DIAGNOSIS — D649 Anemia, unspecified: Secondary | ICD-10-CM | POA: Diagnosis not present

## 2018-07-16 DIAGNOSIS — E1122 Type 2 diabetes mellitus with diabetic chronic kidney disease: Secondary | ICD-10-CM | POA: Diagnosis not present

## 2018-07-16 DIAGNOSIS — Z23 Encounter for immunization: Secondary | ICD-10-CM | POA: Diagnosis not present

## 2018-07-18 DIAGNOSIS — N2581 Secondary hyperparathyroidism of renal origin: Secondary | ICD-10-CM | POA: Diagnosis not present

## 2018-07-18 DIAGNOSIS — Z23 Encounter for immunization: Secondary | ICD-10-CM | POA: Diagnosis not present

## 2018-07-18 DIAGNOSIS — N186 End stage renal disease: Secondary | ICD-10-CM | POA: Diagnosis not present

## 2018-07-18 DIAGNOSIS — D649 Anemia, unspecified: Secondary | ICD-10-CM | POA: Diagnosis not present

## 2018-07-18 DIAGNOSIS — D509 Iron deficiency anemia, unspecified: Secondary | ICD-10-CM | POA: Diagnosis not present

## 2018-07-18 DIAGNOSIS — E1122 Type 2 diabetes mellitus with diabetic chronic kidney disease: Secondary | ICD-10-CM | POA: Diagnosis not present

## 2018-07-20 DIAGNOSIS — N2581 Secondary hyperparathyroidism of renal origin: Secondary | ICD-10-CM | POA: Diagnosis not present

## 2018-07-20 DIAGNOSIS — D509 Iron deficiency anemia, unspecified: Secondary | ICD-10-CM | POA: Diagnosis not present

## 2018-07-20 DIAGNOSIS — N186 End stage renal disease: Secondary | ICD-10-CM | POA: Diagnosis not present

## 2018-07-20 DIAGNOSIS — E1122 Type 2 diabetes mellitus with diabetic chronic kidney disease: Secondary | ICD-10-CM | POA: Diagnosis not present

## 2018-07-20 DIAGNOSIS — Z23 Encounter for immunization: Secondary | ICD-10-CM | POA: Diagnosis not present

## 2018-07-20 DIAGNOSIS — D649 Anemia, unspecified: Secondary | ICD-10-CM | POA: Diagnosis not present

## 2018-07-23 DIAGNOSIS — N2581 Secondary hyperparathyroidism of renal origin: Secondary | ICD-10-CM | POA: Diagnosis not present

## 2018-07-23 DIAGNOSIS — D649 Anemia, unspecified: Secondary | ICD-10-CM | POA: Diagnosis not present

## 2018-07-23 DIAGNOSIS — Z23 Encounter for immunization: Secondary | ICD-10-CM | POA: Diagnosis not present

## 2018-07-23 DIAGNOSIS — E1122 Type 2 diabetes mellitus with diabetic chronic kidney disease: Secondary | ICD-10-CM | POA: Diagnosis not present

## 2018-07-23 DIAGNOSIS — D509 Iron deficiency anemia, unspecified: Secondary | ICD-10-CM | POA: Diagnosis not present

## 2018-07-23 DIAGNOSIS — N186 End stage renal disease: Secondary | ICD-10-CM | POA: Diagnosis not present

## 2018-07-25 ENCOUNTER — Other Ambulatory Visit: Payer: Self-pay | Admitting: Nephrology

## 2018-07-25 DIAGNOSIS — D649 Anemia, unspecified: Secondary | ICD-10-CM | POA: Diagnosis not present

## 2018-07-25 DIAGNOSIS — N186 End stage renal disease: Secondary | ICD-10-CM | POA: Diagnosis not present

## 2018-07-25 DIAGNOSIS — Z23 Encounter for immunization: Secondary | ICD-10-CM | POA: Diagnosis not present

## 2018-07-25 DIAGNOSIS — D509 Iron deficiency anemia, unspecified: Secondary | ICD-10-CM | POA: Diagnosis not present

## 2018-07-25 DIAGNOSIS — R188 Other ascites: Secondary | ICD-10-CM

## 2018-07-25 DIAGNOSIS — N2581 Secondary hyperparathyroidism of renal origin: Secondary | ICD-10-CM | POA: Diagnosis not present

## 2018-07-25 DIAGNOSIS — E1122 Type 2 diabetes mellitus with diabetic chronic kidney disease: Secondary | ICD-10-CM | POA: Diagnosis not present

## 2018-07-27 DIAGNOSIS — D649 Anemia, unspecified: Secondary | ICD-10-CM | POA: Diagnosis not present

## 2018-07-27 DIAGNOSIS — N2581 Secondary hyperparathyroidism of renal origin: Secondary | ICD-10-CM | POA: Diagnosis not present

## 2018-07-27 DIAGNOSIS — N186 End stage renal disease: Secondary | ICD-10-CM | POA: Diagnosis not present

## 2018-07-27 DIAGNOSIS — D509 Iron deficiency anemia, unspecified: Secondary | ICD-10-CM | POA: Diagnosis not present

## 2018-07-27 DIAGNOSIS — E1122 Type 2 diabetes mellitus with diabetic chronic kidney disease: Secondary | ICD-10-CM | POA: Diagnosis not present

## 2018-07-27 DIAGNOSIS — Z23 Encounter for immunization: Secondary | ICD-10-CM | POA: Diagnosis not present

## 2018-07-28 DIAGNOSIS — N186 End stage renal disease: Secondary | ICD-10-CM | POA: Diagnosis not present

## 2018-07-28 DIAGNOSIS — I129 Hypertensive chronic kidney disease with stage 1 through stage 4 chronic kidney disease, or unspecified chronic kidney disease: Secondary | ICD-10-CM | POA: Diagnosis not present

## 2018-07-28 DIAGNOSIS — Z992 Dependence on renal dialysis: Secondary | ICD-10-CM | POA: Diagnosis not present

## 2018-07-30 DIAGNOSIS — D509 Iron deficiency anemia, unspecified: Secondary | ICD-10-CM | POA: Diagnosis not present

## 2018-07-30 DIAGNOSIS — E1122 Type 2 diabetes mellitus with diabetic chronic kidney disease: Secondary | ICD-10-CM | POA: Diagnosis not present

## 2018-07-30 DIAGNOSIS — D649 Anemia, unspecified: Secondary | ICD-10-CM | POA: Diagnosis not present

## 2018-07-30 DIAGNOSIS — N2581 Secondary hyperparathyroidism of renal origin: Secondary | ICD-10-CM | POA: Diagnosis not present

## 2018-07-30 DIAGNOSIS — N186 End stage renal disease: Secondary | ICD-10-CM | POA: Diagnosis not present

## 2018-07-31 ENCOUNTER — Ambulatory Visit
Admission: RE | Admit: 2018-07-31 | Discharge: 2018-07-31 | Disposition: A | Discharge status: Home / Self Care | Payer: Medicare Other | Source: Ambulatory Visit | Attending: Nephrology | Admitting: Nephrology

## 2018-07-31 DIAGNOSIS — R188 Other ascites: Secondary | ICD-10-CM

## 2018-08-01 ENCOUNTER — Encounter: Payer: Self-pay | Admitting: Family Medicine

## 2018-08-01 DIAGNOSIS — D509 Iron deficiency anemia, unspecified: Secondary | ICD-10-CM | POA: Diagnosis not present

## 2018-08-01 DIAGNOSIS — E1122 Type 2 diabetes mellitus with diabetic chronic kidney disease: Secondary | ICD-10-CM | POA: Diagnosis not present

## 2018-08-01 DIAGNOSIS — D649 Anemia, unspecified: Secondary | ICD-10-CM | POA: Diagnosis not present

## 2018-08-01 DIAGNOSIS — N2581 Secondary hyperparathyroidism of renal origin: Secondary | ICD-10-CM | POA: Diagnosis not present

## 2018-08-01 DIAGNOSIS — N186 End stage renal disease: Secondary | ICD-10-CM | POA: Diagnosis not present

## 2018-08-03 DIAGNOSIS — D509 Iron deficiency anemia, unspecified: Secondary | ICD-10-CM | POA: Diagnosis not present

## 2018-08-03 DIAGNOSIS — D649 Anemia, unspecified: Secondary | ICD-10-CM | POA: Diagnosis not present

## 2018-08-03 DIAGNOSIS — N186 End stage renal disease: Secondary | ICD-10-CM | POA: Diagnosis not present

## 2018-08-03 DIAGNOSIS — N2581 Secondary hyperparathyroidism of renal origin: Secondary | ICD-10-CM | POA: Diagnosis not present

## 2018-08-03 DIAGNOSIS — E1122 Type 2 diabetes mellitus with diabetic chronic kidney disease: Secondary | ICD-10-CM | POA: Diagnosis not present

## 2018-08-06 DIAGNOSIS — D649 Anemia, unspecified: Secondary | ICD-10-CM | POA: Diagnosis not present

## 2018-08-06 DIAGNOSIS — E1122 Type 2 diabetes mellitus with diabetic chronic kidney disease: Secondary | ICD-10-CM | POA: Diagnosis not present

## 2018-08-06 DIAGNOSIS — N186 End stage renal disease: Secondary | ICD-10-CM | POA: Diagnosis not present

## 2018-08-06 DIAGNOSIS — N2581 Secondary hyperparathyroidism of renal origin: Secondary | ICD-10-CM | POA: Diagnosis not present

## 2018-08-06 DIAGNOSIS — D509 Iron deficiency anemia, unspecified: Secondary | ICD-10-CM | POA: Diagnosis not present

## 2018-08-07 ENCOUNTER — Telehealth: Payer: Self-pay | Admitting: Family Medicine

## 2018-08-07 MED ORDER — ISOSORBIDE MONONITRATE ER 30 MG PO TB24: 30.0000 mg | ORAL_TABLET | Freq: Every day | ORAL | 1 refills | Status: DC

## 2018-08-07 NOTE — Telephone Encounter (Signed)
done

## 2018-08-07 NOTE — Telephone Encounter (Signed)
Pt's sister came in for an appt and while here informed me that pt needs a refill. Please send Imdur 90 day refill to [facility] on [address]. Pt's sister can be reached at [phone].

## 2018-08-08 ENCOUNTER — Ambulatory Visit: Payer: Medicare Other | Admitting: Adult Health

## 2018-08-08 ENCOUNTER — Ambulatory Visit: Payer: Medicare Other | Admitting: Neurology

## 2018-08-08 DIAGNOSIS — N186 End stage renal disease: Secondary | ICD-10-CM | POA: Diagnosis not present

## 2018-08-08 DIAGNOSIS — D649 Anemia, unspecified: Secondary | ICD-10-CM | POA: Diagnosis not present

## 2018-08-08 DIAGNOSIS — E1122 Type 2 diabetes mellitus with diabetic chronic kidney disease: Secondary | ICD-10-CM | POA: Diagnosis not present

## 2018-08-08 DIAGNOSIS — N2581 Secondary hyperparathyroidism of renal origin: Secondary | ICD-10-CM | POA: Diagnosis not present

## 2018-08-08 DIAGNOSIS — D509 Iron deficiency anemia, unspecified: Secondary | ICD-10-CM | POA: Diagnosis not present

## 2018-08-10 DIAGNOSIS — D509 Iron deficiency anemia, unspecified: Secondary | ICD-10-CM | POA: Diagnosis not present

## 2018-08-10 DIAGNOSIS — E1122 Type 2 diabetes mellitus with diabetic chronic kidney disease: Secondary | ICD-10-CM | POA: Diagnosis not present

## 2018-08-10 DIAGNOSIS — N2581 Secondary hyperparathyroidism of renal origin: Secondary | ICD-10-CM | POA: Diagnosis not present

## 2018-08-10 DIAGNOSIS — D649 Anemia, unspecified: Secondary | ICD-10-CM | POA: Diagnosis not present

## 2018-08-10 DIAGNOSIS — N186 End stage renal disease: Secondary | ICD-10-CM | POA: Diagnosis not present

## 2018-08-12 ENCOUNTER — Other Ambulatory Visit (HOSPITAL_COMMUNITY): Payer: Self-pay | Admitting: Nephrology

## 2018-08-12 DIAGNOSIS — R188 Other ascites: Secondary | ICD-10-CM

## 2018-08-13 DIAGNOSIS — N186 End stage renal disease: Secondary | ICD-10-CM | POA: Diagnosis not present

## 2018-08-13 DIAGNOSIS — N2581 Secondary hyperparathyroidism of renal origin: Secondary | ICD-10-CM | POA: Diagnosis not present

## 2018-08-13 DIAGNOSIS — E1122 Type 2 diabetes mellitus with diabetic chronic kidney disease: Secondary | ICD-10-CM | POA: Diagnosis not present

## 2018-08-13 DIAGNOSIS — D509 Iron deficiency anemia, unspecified: Secondary | ICD-10-CM | POA: Diagnosis not present

## 2018-08-13 DIAGNOSIS — D649 Anemia, unspecified: Secondary | ICD-10-CM | POA: Diagnosis not present

## 2018-08-15 DIAGNOSIS — N186 End stage renal disease: Secondary | ICD-10-CM | POA: Diagnosis not present

## 2018-08-15 DIAGNOSIS — D649 Anemia, unspecified: Secondary | ICD-10-CM | POA: Diagnosis not present

## 2018-08-15 DIAGNOSIS — N2581 Secondary hyperparathyroidism of renal origin: Secondary | ICD-10-CM | POA: Diagnosis not present

## 2018-08-15 DIAGNOSIS — D509 Iron deficiency anemia, unspecified: Secondary | ICD-10-CM | POA: Diagnosis not present

## 2018-08-15 DIAGNOSIS — E1122 Type 2 diabetes mellitus with diabetic chronic kidney disease: Secondary | ICD-10-CM | POA: Diagnosis not present

## 2018-08-17 DIAGNOSIS — D509 Iron deficiency anemia, unspecified: Secondary | ICD-10-CM | POA: Diagnosis not present

## 2018-08-17 DIAGNOSIS — N2581 Secondary hyperparathyroidism of renal origin: Secondary | ICD-10-CM | POA: Diagnosis not present

## 2018-08-17 DIAGNOSIS — N186 End stage renal disease: Secondary | ICD-10-CM | POA: Diagnosis not present

## 2018-08-17 DIAGNOSIS — E1122 Type 2 diabetes mellitus with diabetic chronic kidney disease: Secondary | ICD-10-CM | POA: Diagnosis not present

## 2018-08-17 DIAGNOSIS — D649 Anemia, unspecified: Secondary | ICD-10-CM | POA: Diagnosis not present

## 2018-08-20 DIAGNOSIS — D509 Iron deficiency anemia, unspecified: Secondary | ICD-10-CM | POA: Diagnosis not present

## 2018-08-20 DIAGNOSIS — D649 Anemia, unspecified: Secondary | ICD-10-CM | POA: Diagnosis not present

## 2018-08-20 DIAGNOSIS — N2581 Secondary hyperparathyroidism of renal origin: Secondary | ICD-10-CM | POA: Diagnosis not present

## 2018-08-20 DIAGNOSIS — N186 End stage renal disease: Secondary | ICD-10-CM | POA: Diagnosis not present

## 2018-08-20 DIAGNOSIS — E1122 Type 2 diabetes mellitus with diabetic chronic kidney disease: Secondary | ICD-10-CM | POA: Diagnosis not present

## 2018-08-21 ENCOUNTER — Encounter (HOSPITAL_COMMUNITY): Payer: Self-pay | Admitting: Radiology

## 2018-08-21 ENCOUNTER — Ambulatory Visit (HOSPITAL_COMMUNITY)
Admission: RE | Admit: 2018-08-21 | Discharge: 2018-08-21 | Disposition: A | Discharge status: Home / Self Care | Payer: Medicare Other | Source: Ambulatory Visit | Attending: Nephrology | Admitting: Nephrology

## 2018-08-21 DIAGNOSIS — R188 Other ascites: Secondary | ICD-10-CM | POA: Insufficient documentation

## 2018-08-21 HISTORY — PX: IR PARACENTESIS: IMG2679

## 2018-08-21 MED ORDER — LIDOCAINE HCL 1 % IJ SOLN
INTRAMUSCULAR | Status: AC
  Filled 2018-08-21: qty 20

## 2018-08-21 MED ORDER — LIDOCAINE HCL (PF) 1 % IJ SOLN
INTRAMUSCULAR | Status: DC | PRN
  Administered 2018-08-21: 10 mL

## 2018-08-21 NOTE — Procedures (Signed)
PROCEDURE SUMMARY:  Successful US guided paracentesis from RLQ.  Yielded 8 L of clear yellow fluid.  No immediate complications.  Pt tolerated well.   Specimen was not sent for labs.  Ascencion Dike PA-C 08/21/2018 9:48 AM

## 2018-08-22 DIAGNOSIS — E1122 Type 2 diabetes mellitus with diabetic chronic kidney disease: Secondary | ICD-10-CM | POA: Diagnosis not present

## 2018-08-22 DIAGNOSIS — N2581 Secondary hyperparathyroidism of renal origin: Secondary | ICD-10-CM | POA: Diagnosis not present

## 2018-08-22 DIAGNOSIS — N186 End stage renal disease: Secondary | ICD-10-CM | POA: Diagnosis not present

## 2018-08-22 DIAGNOSIS — D509 Iron deficiency anemia, unspecified: Secondary | ICD-10-CM | POA: Diagnosis not present

## 2018-08-22 DIAGNOSIS — D649 Anemia, unspecified: Secondary | ICD-10-CM | POA: Diagnosis not present

## 2018-08-24 DIAGNOSIS — D509 Iron deficiency anemia, unspecified: Secondary | ICD-10-CM | POA: Diagnosis not present

## 2018-08-24 DIAGNOSIS — N2581 Secondary hyperparathyroidism of renal origin: Secondary | ICD-10-CM | POA: Diagnosis not present

## 2018-08-24 DIAGNOSIS — N186 End stage renal disease: Secondary | ICD-10-CM | POA: Diagnosis not present

## 2018-08-24 DIAGNOSIS — E1122 Type 2 diabetes mellitus with diabetic chronic kidney disease: Secondary | ICD-10-CM | POA: Diagnosis not present

## 2018-08-24 DIAGNOSIS — D649 Anemia, unspecified: Secondary | ICD-10-CM | POA: Diagnosis not present

## 2018-08-27 DIAGNOSIS — Z992 Dependence on renal dialysis: Secondary | ICD-10-CM | POA: Diagnosis not present

## 2018-08-27 DIAGNOSIS — E1122 Type 2 diabetes mellitus with diabetic chronic kidney disease: Secondary | ICD-10-CM | POA: Diagnosis not present

## 2018-08-27 DIAGNOSIS — D509 Iron deficiency anemia, unspecified: Secondary | ICD-10-CM | POA: Diagnosis not present

## 2018-08-27 DIAGNOSIS — N186 End stage renal disease: Secondary | ICD-10-CM | POA: Diagnosis not present

## 2018-08-27 DIAGNOSIS — I129 Hypertensive chronic kidney disease with stage 1 through stage 4 chronic kidney disease, or unspecified chronic kidney disease: Secondary | ICD-10-CM | POA: Diagnosis not present

## 2018-08-27 DIAGNOSIS — N2581 Secondary hyperparathyroidism of renal origin: Secondary | ICD-10-CM | POA: Diagnosis not present

## 2018-08-27 DIAGNOSIS — D649 Anemia, unspecified: Secondary | ICD-10-CM | POA: Diagnosis not present

## 2018-08-29 DIAGNOSIS — N2581 Secondary hyperparathyroidism of renal origin: Secondary | ICD-10-CM | POA: Diagnosis not present

## 2018-08-29 DIAGNOSIS — E1122 Type 2 diabetes mellitus with diabetic chronic kidney disease: Secondary | ICD-10-CM | POA: Diagnosis not present

## 2018-08-29 DIAGNOSIS — D649 Anemia, unspecified: Secondary | ICD-10-CM | POA: Diagnosis not present

## 2018-08-29 DIAGNOSIS — N186 End stage renal disease: Secondary | ICD-10-CM | POA: Diagnosis not present

## 2018-08-29 DIAGNOSIS — D509 Iron deficiency anemia, unspecified: Secondary | ICD-10-CM | POA: Diagnosis not present

## 2018-08-31 DIAGNOSIS — E1122 Type 2 diabetes mellitus with diabetic chronic kidney disease: Secondary | ICD-10-CM | POA: Diagnosis not present

## 2018-08-31 DIAGNOSIS — N2581 Secondary hyperparathyroidism of renal origin: Secondary | ICD-10-CM | POA: Diagnosis not present

## 2018-08-31 DIAGNOSIS — D509 Iron deficiency anemia, unspecified: Secondary | ICD-10-CM | POA: Diagnosis not present

## 2018-08-31 DIAGNOSIS — D649 Anemia, unspecified: Secondary | ICD-10-CM | POA: Diagnosis not present

## 2018-08-31 DIAGNOSIS — N186 End stage renal disease: Secondary | ICD-10-CM | POA: Diagnosis not present

## 2018-09-03 DIAGNOSIS — D509 Iron deficiency anemia, unspecified: Secondary | ICD-10-CM | POA: Diagnosis not present

## 2018-09-03 DIAGNOSIS — E1122 Type 2 diabetes mellitus with diabetic chronic kidney disease: Secondary | ICD-10-CM | POA: Diagnosis not present

## 2018-09-03 DIAGNOSIS — N186 End stage renal disease: Secondary | ICD-10-CM | POA: Diagnosis not present

## 2018-09-03 DIAGNOSIS — D649 Anemia, unspecified: Secondary | ICD-10-CM | POA: Diagnosis not present

## 2018-09-03 DIAGNOSIS — N2581 Secondary hyperparathyroidism of renal origin: Secondary | ICD-10-CM | POA: Diagnosis not present

## 2018-09-04 DIAGNOSIS — C61 Malignant neoplasm of prostate: Secondary | ICD-10-CM | POA: Diagnosis not present

## 2018-09-05 DIAGNOSIS — N186 End stage renal disease: Secondary | ICD-10-CM | POA: Diagnosis not present

## 2018-09-05 DIAGNOSIS — D649 Anemia, unspecified: Secondary | ICD-10-CM | POA: Diagnosis not present

## 2018-09-05 DIAGNOSIS — D509 Iron deficiency anemia, unspecified: Secondary | ICD-10-CM | POA: Diagnosis not present

## 2018-09-05 DIAGNOSIS — N2581 Secondary hyperparathyroidism of renal origin: Secondary | ICD-10-CM | POA: Diagnosis not present

## 2018-09-05 DIAGNOSIS — E1122 Type 2 diabetes mellitus with diabetic chronic kidney disease: Secondary | ICD-10-CM | POA: Diagnosis not present

## 2018-09-06 ENCOUNTER — Other Ambulatory Visit: Payer: Self-pay | Admitting: Family Medicine

## 2018-09-06 DIAGNOSIS — E785 Hyperlipidemia, unspecified: Principal | ICD-10-CM

## 2018-09-06 DIAGNOSIS — E1169 Type 2 diabetes mellitus with other specified complication: Secondary | ICD-10-CM

## 2018-09-07 DIAGNOSIS — N2581 Secondary hyperparathyroidism of renal origin: Secondary | ICD-10-CM | POA: Diagnosis not present

## 2018-09-07 DIAGNOSIS — N186 End stage renal disease: Secondary | ICD-10-CM | POA: Diagnosis not present

## 2018-09-07 DIAGNOSIS — D509 Iron deficiency anemia, unspecified: Secondary | ICD-10-CM | POA: Diagnosis not present

## 2018-09-07 DIAGNOSIS — D649 Anemia, unspecified: Secondary | ICD-10-CM | POA: Diagnosis not present

## 2018-09-07 DIAGNOSIS — E1122 Type 2 diabetes mellitus with diabetic chronic kidney disease: Secondary | ICD-10-CM | POA: Diagnosis not present

## 2018-09-09 DIAGNOSIS — N186 End stage renal disease: Secondary | ICD-10-CM | POA: Diagnosis not present

## 2018-09-09 DIAGNOSIS — N281 Cyst of kidney, acquired: Secondary | ICD-10-CM | POA: Diagnosis not present

## 2018-09-09 DIAGNOSIS — C61 Malignant neoplasm of prostate: Secondary | ICD-10-CM | POA: Diagnosis not present

## 2018-09-09 DIAGNOSIS — C775 Secondary and unspecified malignant neoplasm of intrapelvic lymph nodes: Secondary | ICD-10-CM | POA: Diagnosis not present

## 2018-09-10 DIAGNOSIS — D509 Iron deficiency anemia, unspecified: Secondary | ICD-10-CM | POA: Diagnosis not present

## 2018-09-10 DIAGNOSIS — D649 Anemia, unspecified: Secondary | ICD-10-CM | POA: Diagnosis not present

## 2018-09-10 DIAGNOSIS — E1122 Type 2 diabetes mellitus with diabetic chronic kidney disease: Secondary | ICD-10-CM | POA: Diagnosis not present

## 2018-09-10 DIAGNOSIS — N186 End stage renal disease: Secondary | ICD-10-CM | POA: Diagnosis not present

## 2018-09-10 DIAGNOSIS — N2581 Secondary hyperparathyroidism of renal origin: Secondary | ICD-10-CM | POA: Diagnosis not present

## 2018-09-12 DIAGNOSIS — D649 Anemia, unspecified: Secondary | ICD-10-CM | POA: Diagnosis not present

## 2018-09-12 DIAGNOSIS — N2581 Secondary hyperparathyroidism of renal origin: Secondary | ICD-10-CM | POA: Diagnosis not present

## 2018-09-12 DIAGNOSIS — E1122 Type 2 diabetes mellitus with diabetic chronic kidney disease: Secondary | ICD-10-CM | POA: Diagnosis not present

## 2018-09-12 DIAGNOSIS — N186 End stage renal disease: Secondary | ICD-10-CM | POA: Diagnosis not present

## 2018-09-12 DIAGNOSIS — D509 Iron deficiency anemia, unspecified: Secondary | ICD-10-CM | POA: Diagnosis not present

## 2018-09-14 DIAGNOSIS — N186 End stage renal disease: Secondary | ICD-10-CM | POA: Diagnosis not present

## 2018-09-14 DIAGNOSIS — D509 Iron deficiency anemia, unspecified: Secondary | ICD-10-CM | POA: Diagnosis not present

## 2018-09-14 DIAGNOSIS — E1122 Type 2 diabetes mellitus with diabetic chronic kidney disease: Secondary | ICD-10-CM | POA: Diagnosis not present

## 2018-09-14 DIAGNOSIS — D649 Anemia, unspecified: Secondary | ICD-10-CM | POA: Diagnosis not present

## 2018-09-14 DIAGNOSIS — N2581 Secondary hyperparathyroidism of renal origin: Secondary | ICD-10-CM | POA: Diagnosis not present

## 2018-09-16 DIAGNOSIS — I871 Compression of vein: Secondary | ICD-10-CM | POA: Diagnosis not present

## 2018-09-16 DIAGNOSIS — Z992 Dependence on renal dialysis: Secondary | ICD-10-CM | POA: Diagnosis not present

## 2018-09-16 DIAGNOSIS — N186 End stage renal disease: Secondary | ICD-10-CM | POA: Diagnosis not present

## 2018-09-16 DIAGNOSIS — T82858A Stenosis of vascular prosthetic devices, implants and grafts, initial encounter: Secondary | ICD-10-CM | POA: Diagnosis not present

## 2018-09-17 DIAGNOSIS — D649 Anemia, unspecified: Secondary | ICD-10-CM | POA: Diagnosis not present

## 2018-09-17 DIAGNOSIS — E1122 Type 2 diabetes mellitus with diabetic chronic kidney disease: Secondary | ICD-10-CM | POA: Diagnosis not present

## 2018-09-17 DIAGNOSIS — D509 Iron deficiency anemia, unspecified: Secondary | ICD-10-CM | POA: Diagnosis not present

## 2018-09-17 DIAGNOSIS — N186 End stage renal disease: Secondary | ICD-10-CM | POA: Diagnosis not present

## 2018-09-17 DIAGNOSIS — N2581 Secondary hyperparathyroidism of renal origin: Secondary | ICD-10-CM | POA: Diagnosis not present

## 2018-09-19 DIAGNOSIS — N186 End stage renal disease: Secondary | ICD-10-CM | POA: Diagnosis not present

## 2018-09-19 DIAGNOSIS — N2581 Secondary hyperparathyroidism of renal origin: Secondary | ICD-10-CM | POA: Diagnosis not present

## 2018-09-19 DIAGNOSIS — D649 Anemia, unspecified: Secondary | ICD-10-CM | POA: Diagnosis not present

## 2018-09-19 DIAGNOSIS — D509 Iron deficiency anemia, unspecified: Secondary | ICD-10-CM | POA: Diagnosis not present

## 2018-09-19 DIAGNOSIS — E1122 Type 2 diabetes mellitus with diabetic chronic kidney disease: Secondary | ICD-10-CM | POA: Diagnosis not present

## 2018-09-21 DIAGNOSIS — N186 End stage renal disease: Secondary | ICD-10-CM | POA: Diagnosis not present

## 2018-09-21 DIAGNOSIS — N2581 Secondary hyperparathyroidism of renal origin: Secondary | ICD-10-CM | POA: Diagnosis not present

## 2018-09-21 DIAGNOSIS — D649 Anemia, unspecified: Secondary | ICD-10-CM | POA: Diagnosis not present

## 2018-09-21 DIAGNOSIS — D509 Iron deficiency anemia, unspecified: Secondary | ICD-10-CM | POA: Diagnosis not present

## 2018-09-21 DIAGNOSIS — E1122 Type 2 diabetes mellitus with diabetic chronic kidney disease: Secondary | ICD-10-CM | POA: Diagnosis not present

## 2018-09-24 DIAGNOSIS — N186 End stage renal disease: Secondary | ICD-10-CM | POA: Diagnosis not present

## 2018-09-24 DIAGNOSIS — D509 Iron deficiency anemia, unspecified: Secondary | ICD-10-CM | POA: Diagnosis not present

## 2018-09-24 DIAGNOSIS — D649 Anemia, unspecified: Secondary | ICD-10-CM | POA: Diagnosis not present

## 2018-09-24 DIAGNOSIS — E1122 Type 2 diabetes mellitus with diabetic chronic kidney disease: Secondary | ICD-10-CM | POA: Diagnosis not present

## 2018-09-24 DIAGNOSIS — N2581 Secondary hyperparathyroidism of renal origin: Secondary | ICD-10-CM | POA: Diagnosis not present

## 2018-09-26 DIAGNOSIS — N2581 Secondary hyperparathyroidism of renal origin: Secondary | ICD-10-CM | POA: Diagnosis not present

## 2018-09-26 DIAGNOSIS — E1122 Type 2 diabetes mellitus with diabetic chronic kidney disease: Secondary | ICD-10-CM | POA: Diagnosis not present

## 2018-09-26 DIAGNOSIS — D649 Anemia, unspecified: Secondary | ICD-10-CM | POA: Diagnosis not present

## 2018-09-26 DIAGNOSIS — N186 End stage renal disease: Secondary | ICD-10-CM | POA: Diagnosis not present

## 2018-09-26 DIAGNOSIS — D509 Iron deficiency anemia, unspecified: Secondary | ICD-10-CM | POA: Diagnosis not present

## 2018-09-27 DIAGNOSIS — Z992 Dependence on renal dialysis: Secondary | ICD-10-CM | POA: Diagnosis not present

## 2018-09-27 DIAGNOSIS — I129 Hypertensive chronic kidney disease with stage 1 through stage 4 chronic kidney disease, or unspecified chronic kidney disease: Secondary | ICD-10-CM | POA: Diagnosis not present

## 2018-09-27 DIAGNOSIS — N186 End stage renal disease: Secondary | ICD-10-CM | POA: Diagnosis not present

## 2018-09-28 DIAGNOSIS — D649 Anemia, unspecified: Secondary | ICD-10-CM | POA: Diagnosis not present

## 2018-09-28 DIAGNOSIS — E1122 Type 2 diabetes mellitus with diabetic chronic kidney disease: Secondary | ICD-10-CM | POA: Diagnosis not present

## 2018-09-28 DIAGNOSIS — N186 End stage renal disease: Secondary | ICD-10-CM | POA: Diagnosis not present

## 2018-09-28 DIAGNOSIS — N2581 Secondary hyperparathyroidism of renal origin: Secondary | ICD-10-CM | POA: Diagnosis not present

## 2018-09-28 DIAGNOSIS — D509 Iron deficiency anemia, unspecified: Secondary | ICD-10-CM | POA: Diagnosis not present

## 2018-10-01 DIAGNOSIS — N2581 Secondary hyperparathyroidism of renal origin: Secondary | ICD-10-CM | POA: Diagnosis not present

## 2018-10-01 DIAGNOSIS — N186 End stage renal disease: Secondary | ICD-10-CM | POA: Diagnosis not present

## 2018-10-01 DIAGNOSIS — E1122 Type 2 diabetes mellitus with diabetic chronic kidney disease: Secondary | ICD-10-CM | POA: Diagnosis not present

## 2018-10-01 DIAGNOSIS — D509 Iron deficiency anemia, unspecified: Secondary | ICD-10-CM | POA: Diagnosis not present

## 2018-10-01 DIAGNOSIS — D649 Anemia, unspecified: Secondary | ICD-10-CM | POA: Diagnosis not present

## 2018-10-03 ENCOUNTER — Other Ambulatory Visit: Payer: Self-pay | Admitting: Adult Health

## 2018-10-03 DIAGNOSIS — D509 Iron deficiency anemia, unspecified: Secondary | ICD-10-CM | POA: Diagnosis not present

## 2018-10-03 DIAGNOSIS — N2581 Secondary hyperparathyroidism of renal origin: Secondary | ICD-10-CM | POA: Diagnosis not present

## 2018-10-03 DIAGNOSIS — D649 Anemia, unspecified: Secondary | ICD-10-CM | POA: Diagnosis not present

## 2018-10-03 DIAGNOSIS — N186 End stage renal disease: Secondary | ICD-10-CM | POA: Diagnosis not present

## 2018-10-03 DIAGNOSIS — E1122 Type 2 diabetes mellitus with diabetic chronic kidney disease: Secondary | ICD-10-CM | POA: Diagnosis not present

## 2018-10-05 DIAGNOSIS — D509 Iron deficiency anemia, unspecified: Secondary | ICD-10-CM | POA: Diagnosis not present

## 2018-10-05 DIAGNOSIS — E1122 Type 2 diabetes mellitus with diabetic chronic kidney disease: Secondary | ICD-10-CM | POA: Diagnosis not present

## 2018-10-05 DIAGNOSIS — N186 End stage renal disease: Secondary | ICD-10-CM | POA: Diagnosis not present

## 2018-10-05 DIAGNOSIS — D649 Anemia, unspecified: Secondary | ICD-10-CM | POA: Diagnosis not present

## 2018-10-05 DIAGNOSIS — N2581 Secondary hyperparathyroidism of renal origin: Secondary | ICD-10-CM | POA: Diagnosis not present

## 2018-10-08 DIAGNOSIS — N2581 Secondary hyperparathyroidism of renal origin: Secondary | ICD-10-CM | POA: Diagnosis not present

## 2018-10-08 DIAGNOSIS — N186 End stage renal disease: Secondary | ICD-10-CM | POA: Diagnosis not present

## 2018-10-08 DIAGNOSIS — E1122 Type 2 diabetes mellitus with diabetic chronic kidney disease: Secondary | ICD-10-CM | POA: Diagnosis not present

## 2018-10-08 DIAGNOSIS — D649 Anemia, unspecified: Secondary | ICD-10-CM | POA: Diagnosis not present

## 2018-10-08 DIAGNOSIS — D509 Iron deficiency anemia, unspecified: Secondary | ICD-10-CM | POA: Diagnosis not present

## 2018-10-10 ENCOUNTER — Other Ambulatory Visit: Payer: Self-pay

## 2018-10-10 ENCOUNTER — Telehealth: Payer: Self-pay | Admitting: Adult Health

## 2018-10-10 DIAGNOSIS — D509 Iron deficiency anemia, unspecified: Secondary | ICD-10-CM | POA: Diagnosis not present

## 2018-10-10 DIAGNOSIS — N2581 Secondary hyperparathyroidism of renal origin: Secondary | ICD-10-CM | POA: Diagnosis not present

## 2018-10-10 DIAGNOSIS — N186 End stage renal disease: Secondary | ICD-10-CM | POA: Diagnosis not present

## 2018-10-10 DIAGNOSIS — E1122 Type 2 diabetes mellitus with diabetic chronic kidney disease: Secondary | ICD-10-CM | POA: Diagnosis not present

## 2018-10-10 DIAGNOSIS — D649 Anemia, unspecified: Secondary | ICD-10-CM | POA: Diagnosis not present

## 2018-10-10 MED ORDER — LEVETIRACETAM 250 MG PO TABS: 250.0000 mg | ORAL_TABLET | Freq: Two times a day (BID) | ORAL | 0 refills | Status: DC

## 2018-10-10 NOTE — Telephone Encounter (Signed)
Rn call [facility] and spoke with [employer]. Rn stated pt needing a refill for keppra but was last seen in 01/2018.  Pt was told the hospital started it in June 2019.RN stated Jinny Blossom NP gave pt 5 refills in January 2019, and DR. SEthi taper him off keppra in March 2019 at last appt. Jessica the pharmacy tech gave the following information below about pt refills:She stated pt skip some months on picking up the medication.  January 2019: Keppra was refill February 2019 : PT did not refill keppra March 2019: Keppra was refill April 2019: PT did not refill keppra it was discontinue by Dr.Sethi May 2019: PT did not refill Keppra June 2019: Pt did not refill keppra, Per pt phone note, The ED put him back on keppra  July 2019:Keppra was refill August 2019: Keppra was refill September 2019: Keppra was refill Last refill in Sep 30,2019

## 2018-10-10 NOTE — Telephone Encounter (Signed)
Pt called stating he is needing refills for levETIRAcetam (KEPPRA) 250 MG tablet he is aware that Dr/ [name] took him off the medication in March but the ED put him back on it in June. Requesting a call to discuss further will be away at til after 5 but can be reached tomorrow. Pt also r/s appt with NP Janett Billow for 11/27

## 2018-10-10 NOTE — Telephone Encounter (Signed)
Rn spoke with Jinny Blossom NP. She recommend giving patient at 30 day rx until he sees [place] NP. PT needs to keep with Janett Billow on 10/23/2018. Refill sent.

## 2018-10-12 DIAGNOSIS — E1122 Type 2 diabetes mellitus with diabetic chronic kidney disease: Secondary | ICD-10-CM | POA: Diagnosis not present

## 2018-10-12 DIAGNOSIS — D649 Anemia, unspecified: Secondary | ICD-10-CM | POA: Diagnosis not present

## 2018-10-12 DIAGNOSIS — N186 End stage renal disease: Secondary | ICD-10-CM | POA: Diagnosis not present

## 2018-10-12 DIAGNOSIS — N2581 Secondary hyperparathyroidism of renal origin: Secondary | ICD-10-CM | POA: Diagnosis not present

## 2018-10-12 DIAGNOSIS — D509 Iron deficiency anemia, unspecified: Secondary | ICD-10-CM | POA: Diagnosis not present

## 2018-10-15 DIAGNOSIS — N186 End stage renal disease: Secondary | ICD-10-CM | POA: Diagnosis not present

## 2018-10-15 DIAGNOSIS — D649 Anemia, unspecified: Secondary | ICD-10-CM | POA: Diagnosis not present

## 2018-10-15 DIAGNOSIS — D509 Iron deficiency anemia, unspecified: Secondary | ICD-10-CM | POA: Diagnosis not present

## 2018-10-15 DIAGNOSIS — N2581 Secondary hyperparathyroidism of renal origin: Secondary | ICD-10-CM | POA: Diagnosis not present

## 2018-10-15 DIAGNOSIS — E1122 Type 2 diabetes mellitus with diabetic chronic kidney disease: Secondary | ICD-10-CM | POA: Diagnosis not present

## 2018-10-17 DIAGNOSIS — N2581 Secondary hyperparathyroidism of renal origin: Secondary | ICD-10-CM | POA: Diagnosis not present

## 2018-10-17 DIAGNOSIS — D509 Iron deficiency anemia, unspecified: Secondary | ICD-10-CM | POA: Diagnosis not present

## 2018-10-17 DIAGNOSIS — N186 End stage renal disease: Secondary | ICD-10-CM | POA: Diagnosis not present

## 2018-10-17 DIAGNOSIS — E1122 Type 2 diabetes mellitus with diabetic chronic kidney disease: Secondary | ICD-10-CM | POA: Diagnosis not present

## 2018-10-17 DIAGNOSIS — D649 Anemia, unspecified: Secondary | ICD-10-CM | POA: Diagnosis not present

## 2018-10-19 DIAGNOSIS — D509 Iron deficiency anemia, unspecified: Secondary | ICD-10-CM | POA: Diagnosis not present

## 2018-10-19 DIAGNOSIS — E1122 Type 2 diabetes mellitus with diabetic chronic kidney disease: Secondary | ICD-10-CM | POA: Diagnosis not present

## 2018-10-19 DIAGNOSIS — N2581 Secondary hyperparathyroidism of renal origin: Secondary | ICD-10-CM | POA: Diagnosis not present

## 2018-10-19 DIAGNOSIS — D649 Anemia, unspecified: Secondary | ICD-10-CM | POA: Diagnosis not present

## 2018-10-19 DIAGNOSIS — N186 End stage renal disease: Secondary | ICD-10-CM | POA: Diagnosis not present

## 2018-10-21 DIAGNOSIS — K219 Gastro-esophageal reflux disease without esophagitis: Secondary | ICD-10-CM | POA: Diagnosis not present

## 2018-10-21 DIAGNOSIS — I10 Essential (primary) hypertension: Secondary | ICD-10-CM | POA: Diagnosis not present

## 2018-10-23 ENCOUNTER — Ambulatory Visit: Payer: Medicare Other | Admitting: Adult Health

## 2018-10-23 ENCOUNTER — Encounter (HOSPITAL_COMMUNITY): Payer: Self-pay | Admitting: Emergency Medicine

## 2018-10-23 ENCOUNTER — Emergency Department (HOSPITAL_COMMUNITY)
Admission: EM | Admit: 2018-10-23 | Discharge: 2018-10-23 | Disposition: A | Discharge status: Home / Self Care | Payer: Medicare Other | Attending: Emergency Medicine | Admitting: Emergency Medicine

## 2018-10-23 ENCOUNTER — Emergency Department (HOSPITAL_COMMUNITY): Payer: Medicare Other

## 2018-10-23 DIAGNOSIS — Z992 Dependence on renal dialysis: Secondary | ICD-10-CM | POA: Diagnosis not present

## 2018-10-23 DIAGNOSIS — Z79899 Other long term (current) drug therapy: Secondary | ICD-10-CM | POA: Insufficient documentation

## 2018-10-23 DIAGNOSIS — R110 Nausea: Secondary | ICD-10-CM | POA: Insufficient documentation

## 2018-10-23 DIAGNOSIS — N186 End stage renal disease: Secondary | ICD-10-CM | POA: Diagnosis not present

## 2018-10-23 DIAGNOSIS — D649 Anemia, unspecified: Secondary | ICD-10-CM | POA: Diagnosis not present

## 2018-10-23 DIAGNOSIS — N2581 Secondary hyperparathyroidism of renal origin: Secondary | ICD-10-CM | POA: Diagnosis not present

## 2018-10-23 DIAGNOSIS — Z794 Long term (current) use of insulin: Secondary | ICD-10-CM | POA: Diagnosis not present

## 2018-10-23 DIAGNOSIS — E1122 Type 2 diabetes mellitus with diabetic chronic kidney disease: Secondary | ICD-10-CM | POA: Diagnosis not present

## 2018-10-23 DIAGNOSIS — Z87891 Personal history of nicotine dependence: Secondary | ICD-10-CM | POA: Insufficient documentation

## 2018-10-23 DIAGNOSIS — R05 Cough: Secondary | ICD-10-CM | POA: Diagnosis not present

## 2018-10-23 DIAGNOSIS — I120 Hypertensive chronic kidney disease with stage 5 chronic kidney disease or end stage renal disease: Secondary | ICD-10-CM | POA: Insufficient documentation

## 2018-10-23 DIAGNOSIS — D509 Iron deficiency anemia, unspecified: Secondary | ICD-10-CM | POA: Diagnosis not present

## 2018-10-23 LAB — CBC WITH DIFFERENTIAL/PLATELET
Abs Immature Granulocytes: 0.02 10*3/uL (ref 0.00–0.07)
Basophils Absolute: 0 10*3/uL (ref 0.0–0.1)
Basophils Relative: 1 %
Eosinophils Absolute: 0 10*3/uL (ref 0.0–0.5)
Eosinophils Relative: 0 %
HCT: 37.8 % — ABNORMAL LOW (ref 39.0–52.0)
Hemoglobin: 10.7 g/dL — ABNORMAL LOW (ref 13.0–17.0)
Immature Granulocytes: 0 %
Lymphocytes Relative: 19 %
Lymphs Abs: 0.9 10*3/uL (ref 0.7–4.0)
MCH: 22.9 pg — ABNORMAL LOW (ref 26.0–34.0)
MCHC: 28.3 g/dL — ABNORMAL LOW (ref 30.0–36.0)
MCV: 80.9 fL (ref 80.0–100.0)
Monocytes Absolute: 0.4 10*3/uL (ref 0.1–1.0)
Monocytes Relative: 9 %
Neutro Abs: 3.3 10*3/uL (ref 1.7–7.7)
Neutrophils Relative %: 71 %
Platelets: 222 10*3/uL (ref 150–400)
RBC: 4.67 MIL/uL (ref 4.22–5.81)
RDW: 20.4 % — ABNORMAL HIGH (ref 11.5–15.5)
WBC: 4.7 10*3/uL (ref 4.0–10.5)
nRBC: 0 % (ref 0.0–0.2)

## 2018-10-23 LAB — BASIC METABOLIC PANEL
Anion gap: 19 — ABNORMAL HIGH (ref 5–15)
BUN: 30 mg/dL — ABNORMAL HIGH (ref 8–23)
CO2: 23 mmol/L (ref 22–32)
Calcium: 9.2 mg/dL (ref 8.9–10.3)
Chloride: 96 mmol/L — ABNORMAL LOW (ref 98–111)
Creatinine, Ser: 5.55 mg/dL — ABNORMAL HIGH (ref 0.61–1.24)
GFR calc Af Amer: 12 mL/min — ABNORMAL LOW (ref >60)
GFR calc non Af Amer: 10 mL/min — ABNORMAL LOW (ref >60)
Glucose, Bld: 335 mg/dL — ABNORMAL HIGH (ref 70–99)
Potassium: 3.4 mmol/L — ABNORMAL LOW (ref 3.5–5.1)
Sodium: 138 mmol/L (ref 135–145)

## 2018-10-23 MED ORDER — PROMETHAZINE HCL 25 MG PO TABS
12.5000 mg | ORAL_TABLET | Freq: Once | ORAL | Status: AC
  Administered 2018-10-23: 12.5 mg via ORAL
  Filled 2018-10-23: qty 1

## 2018-10-23 MED ORDER — CHLORPROMAZINE HCL 25 MG PO TABS
25.0000 mg | ORAL_TABLET | Freq: Once | ORAL | Status: AC
  Administered 2018-10-23: 25 mg via ORAL
  Filled 2018-10-23: qty 1

## 2018-10-23 MED ORDER — ONDANSETRON 4 MG PO TBDP: 4.0000 mg | ORAL_TABLET | Freq: Three times a day (TID) | ORAL | 0 refills | Status: AC | PRN

## 2018-10-23 NOTE — ED Notes (Signed)
Discharge instructions and prescription discussed with Pt. Pt verbalized understanding. Pt stable and ambulatory.    

## 2018-10-23 NOTE — ED Triage Notes (Signed)
Pt here after dialysis, pt reports loss of appetite x 3 days, chills since yesterday and facial swelling under jaw bone today, pt reports throwing up with anything on his stomach .

## 2018-10-26 DIAGNOSIS — E1122 Type 2 diabetes mellitus with diabetic chronic kidney disease: Secondary | ICD-10-CM | POA: Diagnosis not present

## 2018-10-26 DIAGNOSIS — N2581 Secondary hyperparathyroidism of renal origin: Secondary | ICD-10-CM | POA: Diagnosis not present

## 2018-10-26 DIAGNOSIS — D649 Anemia, unspecified: Secondary | ICD-10-CM | POA: Diagnosis not present

## 2018-10-26 DIAGNOSIS — D509 Iron deficiency anemia, unspecified: Secondary | ICD-10-CM | POA: Diagnosis not present

## 2018-10-26 DIAGNOSIS — N186 End stage renal disease: Secondary | ICD-10-CM | POA: Diagnosis not present

## 2018-10-27 DIAGNOSIS — N186 End stage renal disease: Secondary | ICD-10-CM | POA: Diagnosis not present

## 2018-10-27 DIAGNOSIS — I129 Hypertensive chronic kidney disease with stage 1 through stage 4 chronic kidney disease, or unspecified chronic kidney disease: Secondary | ICD-10-CM | POA: Diagnosis not present

## 2018-10-27 DIAGNOSIS — Z992 Dependence on renal dialysis: Secondary | ICD-10-CM | POA: Diagnosis not present

## 2018-10-29 DIAGNOSIS — D509 Iron deficiency anemia, unspecified: Secondary | ICD-10-CM | POA: Diagnosis not present

## 2018-10-29 DIAGNOSIS — E1122 Type 2 diabetes mellitus with diabetic chronic kidney disease: Secondary | ICD-10-CM | POA: Diagnosis not present

## 2018-10-29 DIAGNOSIS — N186 End stage renal disease: Secondary | ICD-10-CM | POA: Diagnosis not present

## 2018-10-29 DIAGNOSIS — N2581 Secondary hyperparathyroidism of renal origin: Secondary | ICD-10-CM | POA: Diagnosis not present

## 2018-10-29 DIAGNOSIS — D649 Anemia, unspecified: Secondary | ICD-10-CM | POA: Diagnosis not present

## 2018-10-30 ENCOUNTER — Ambulatory Visit (INDEPENDENT_AMBULATORY_CARE_PROVIDER_SITE_OTHER): Payer: Medicare Other | Admitting: Family Medicine

## 2018-10-30 ENCOUNTER — Encounter: Payer: Self-pay | Admitting: Family Medicine

## 2018-10-30 VITALS — BP 132/74 | HR 72 | Resp 18 | Ht 67.5 in | Wt 157.6 lb

## 2018-10-30 DIAGNOSIS — Z992 Dependence on renal dialysis: Secondary | ICD-10-CM | POA: Diagnosis not present

## 2018-10-30 DIAGNOSIS — D631 Anemia in chronic kidney disease: Secondary | ICD-10-CM

## 2018-10-30 DIAGNOSIS — I10 Essential (primary) hypertension: Secondary | ICD-10-CM | POA: Diagnosis not present

## 2018-10-30 DIAGNOSIS — N189 Chronic kidney disease, unspecified: Secondary | ICD-10-CM

## 2018-10-30 DIAGNOSIS — R5383 Other fatigue: Secondary | ICD-10-CM | POA: Diagnosis not present

## 2018-10-30 DIAGNOSIS — N186 End stage renal disease: Secondary | ICD-10-CM

## 2018-10-30 DIAGNOSIS — E1142 Type 2 diabetes mellitus with diabetic polyneuropathy: Secondary | ICD-10-CM

## 2018-10-30 DIAGNOSIS — R739 Hyperglycemia, unspecified: Secondary | ICD-10-CM

## 2018-10-30 LAB — POCT GLYCOSYLATED HEMOGLOBIN (HGB A1C): HEMOGLOBIN A1C: 7.2 % — AB (ref 4.0–5.6)

## 2018-10-30 LAB — GLUCOSE, POCT (MANUAL RESULT ENTRY): POC Glucose: 380 mg/dl — AB (ref 70–99)

## 2018-10-30 NOTE — Progress Notes (Signed)
   Subjective:    Patient ID: Jared [name], male    DOB: [birth year], 62 y.o.   MRN: [id number]  HPI Chief Complaint  Patient presents with  . Hospitalization Follow-up    was seen at ED 11/27 fro nausea and facial swelling. Here for follow up and reports he is feeling great today and has no concerns.    He is here to follow up on ED visit for nausea and facial swelling. Reports both issues have completely resolved. Notes from the ED are not available.  He reports feeling good. Dialysis is scheduled for tomorrow and he states they will check labs then. He does not want to have blood work today.   States he is no longer seeing his endocrinologist. Insulin dependent diabetes with end stage renal disease and dialysis patient.  States he checked his blood sugar "10 times per day".  States he uses a sliding scale at home to determine his insulin dose.  No low readings per patient. He has had some hyperglycemia.  Reports good appetite, no increased thirst or urination. Does feel more tired than usual.   Denies fever, chills, dizziness, chest pain, palpitations, shortness of breath, abdominal pain, N/V/D, urinary symptoms, LE edema.   Reviewed allergies, medications, past medical, surgical, family, and social history.   Review of Systems Pertinent positives and negatives in the history of present illness.     Objective:   Physical Exam BP 132/74   Pulse 72   Resp 18   Ht 5' 7.5" (1.715 m)   Wt 157 lb 9.6 oz (71.5 kg)   BMI 24.32 kg/m  Alert and in no distress.  Neck is supple without adenopathy or thyromegaly. Cardiac exam shows a regular sinus rhythm without murmurs or gallops. Lungs are clear to auscultation. Extremities with trace pitting edema on right lower leg.       Assessment & Plan:  Type 2 diabetes mellitus with peripheral neuropathy (HCC) - Plan: HgB A1c, Glucose (CBG)  CKD (chronic kidney disease) stage V requiring chronic dialysis (HCC)  Anemia associated with  chronic renal failure  Essential hypertension  Fatigue, unspecified type  Hyperglycemia  BP is in goal range. Continue current medications.  He refuses blood work today but will allow me to check a finger stick glucose and Hgb A1c.  States he has not been to his endocrinologist and does not plan to go back. He is in agreement to see a new one. Will need to refer him to establish for complicated insulin diabetes with history of coma.  PCOT glucose 380. Appears asymptomatic.  Hgb A1c- 7.2% No longer having N/V or facial swelling.  Unable to see the ED physicians note and not sure why.  He will go to dialysis tomorrow and advised him that he should have his potassium repeated.  Follow up in 4 weeks.

## 2018-10-30 NOTE — Patient Instructions (Addendum)
Your blood sugar is 380. I am referring you back to an endocrinologist and need for you to schedule with them when they contact you.   Your hemoglobin A1c is 7.2% today.  Continue checking your blood sugar at least 3 times per day if not more often.  Take in your blood sugar readings to your endocrinologist.   Make sure you get your potassium rechecked tomorrow at your dialysis appointment.   Follow up with me in 4 weeks.

## 2018-10-30 NOTE — ED Provider Notes (Signed)
[place] EMERGENCY DEPARTMENT Provider Note   CSN: [id number] Arrival date & time: 10/23/18  1726     History   Chief Complaint Chief Complaint  Patient presents with  . Emesis  . Facial Swelling    HPI Jared [name] is a 62 y.o. male.  HPI  62 year old male presenting after dialysis.  Reports about a 3-day history of anorexia.  States it is has no appetite and has no energy.  Some subjective fevers and chills.  Also feels he is having some facial swelling along the right side of his face.  Feeling very nauseated.  No acute respiratory complaints.  Received a full dialysis session today.  Past Medical History:  Diagnosis Date  . Anemia   . Cancer [facility])    early prostate cancer per patient  . Chronic kidney disease    on dialysis M-W-F  . Diabetes mellitus without complication ([place])    onset as adult  . GERD (gastroesophageal reflux disease)   . Hypertension   . Seizures ([place])    pt states he thinks this is bc of his blood sugar  . Stroke [facility])    patient denies  . Thyroid disease    hyperparathyroidism    Patient Active Problem List   Diagnosis Date Noted  . Hyperosmolality   . Respiratory insufficiency 04/29/2018  . Altered mental status   . Acute metabolic encephalopathy 34/19/6222  . Metabolic encephalopathy 97/98/9211  . Encephalopathy   . Type 2 diabetes mellitus with peripheral neuropathy (HCC)   . History of prostate cancer   . Acute blood loss anemia   . Witnessed seizure-like activity ([place])   . Weakness   . Prostate cancer ([place]) 03/13/2017  . Hyperglycemia 03/13/2017  . Accelerated hypertension 03/13/2017  . Elevated LDL cholesterol level 01/24/2017  . End-stage renal disease on peritoneal dialysis ([place])   . Anemia associated with chronic renal failure   . Abdominal pain 12/08/2016  . Enlarged prostate 12/08/2016  . 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 Hypertension   . CKD (chronic kidney disease) stage V requiring chronic  dialysis ([place])   . Acute respiratory failure ([place])   . Seizure ([place]) 11/24/2016  . Status epilepticus [facility])     Past Surgical History:  Procedure Laterality Date  . AV FISTULA PLACEMENT Left 06/04/2014   Procedure: ARTERIOVENOUS (AV) FISTULA CREATION-  BRACHIOCEPHALIC WITH LIGATION OF COMPETEING BRANCH;  Surgeon: Mal Misty, MD;  Location: [place];  Service: Vascular;  Laterality: Left;  . INGUINAL HERNIA REPAIR Left 02/19/2018   Procedure: LAPAROSCOPIC LEFT  INGUINAL HERNIA;  Surgeon: Ralene Ok, MD;  Location: [place];  Service: General;  Laterality: Left;  . INSERTION OF DIALYSIS CATHETER  04/2014  . INSERTION OF MESH Left 02/19/2018   Procedure: INSERTION OF MESH;  Surgeon: Ralene Ok, MD;  Location: [place];  Service: General;  Laterality: Left;  . IR GENERIC HISTORICAL Left 11/30/2016   IR THROMBECTOMY AV FISTULA W/THROMBOLYSIS/PTA/STENT INC/SHUNT/IMG LT 11/30/2016 Markus Daft, MD [facility]-INTERV RAD  . IR GENERIC HISTORICAL  11/30/2016   IR US GUIDE VASC ACCESS LEFT 11/30/2016 Markus Daft, MD [facility]INTER[facility]  . IR PARACENTESIS  08/21/2018        Home Medications    Prior to Admission medications   Medication Sig Start Date End Date Taking? Authorizing Provider  amLODipine (NORVASC) 10 MG tablet Take 1 tablet (10 mg total) by mouth daily. 12/02/17   Sheikh, Georgina Quint Latif, DO  atorvastatin (LIPITOR) 20 MG tablet TAKE 1 TABLET  BY MOUTH ONCE DAILY 09/06/18   Henson, Vickie L, NP-C  b complex-vitamin c-folic acid (NEPHRO-VITE) 0.8 MG TABS tablet TAKE 1 TABLET BY MOUTH EVERY DAY (ON DIALYSIS DAYS, TAKE AFTER DIALYSIS TREATMENT) 09/25/18   [provider]  cinacalcet (SENSIPAR) 30 MG tablet Take 30 mg by mouth daily. 04/25/18   [provider]  cloNIDine (CATAPRES) 0.3 MG tablet Take 0.3 mg by mouth 2 (two) times daily. as directed 04/24/18   [provider]  hydrALAZINE (APRESOLINE) 100 MG tablet Take 1 tablet (100 mg total) by mouth 3 (three) times daily. 03/27/17   Angiulli,  Lavon Paganini, PA-C  insulin NPH Human (NOVOLIN N) 100 UNIT/ML injection Inject 20 units every morning Patient taking differently: Inject 30 Units into the skin daily before breakfast.  04/03/17   Renato Shin, MD  insulin regular (NOVOLIN R,HUMULIN R) 100 units/mL injection Inject into the skin.    [provider]  isosorbide mononitrate (IMDUR) 30 MG 24 hr tablet Take 1 tablet (30 mg total) by mouth daily. 08/07/18   Henson, Vickie L, NP-C  levETIRAcetam (KEPPRA) 250 MG tablet Take 1 tablet (250 mg total) by mouth 2 (two) times daily. 10/10/18   Ward Givens, NP  metoprolol tartrate (LOPRESSOR) 100 MG tablet Take 1 tablet (100 mg total) by mouth 2 (two) times daily. 05/21/17   Henson, Vickie L, NP-C  ondansetron (ZOFRAN ODT) 4 MG disintegrating tablet Take 1 tablet (4 mg total) by mouth every 8 (eight) hours as needed for nausea or vomiting. Patient not taking: Reported on 10/30/2018 10/23/18   Virgel Manifold, MD  pantoprazole (PROTONIX) 40 MG tablet TAKE 1 TABLET BY MOUTH ONCE DAILY AT 6AM Patient taking differently: TAKE 1 TABLET BY MOUTH twice DAILy 02/01/18   Henson, Vickie L, NP-C  promethazine-dextromethorphan (PROMETHAZINE-DM) 6.25-15 MG/5ML syrup TAKE 5 MLS BY MOUTH 4 TIMES DAILY AS NEEDED 10/21/18   [provider]  sevelamer carbonate (RENVELA) 800 MG tablet Take 3 tablets (2,400 mg total) by mouth 3 (three) times daily with meals. 03/27/17   Angiulli, Lavon Paganini, PA-C    Family History Family History  Problem Relation Age of Onset  . Cancer - Lung Mother   . Cancer - Other Father   . Diabetes Neg Hx     Social History Social History   Tobacco Use  . Smoking status: Former Smoker    Last attempt to quit: 05/11/1984    Years since quitting: 34.4  . Smokeless tobacco: Never Used  Substance Use Topics  . Alcohol use: Yes    Alcohol/week: 1.0 standard drinks    Types: 1 Glasses of wine per week    Comment: occasional  . Drug use: No     Allergies    Penicillins   Review of Systems Review of Systems All systems reviewed and negative, other than as noted in HPI.   Physical Exam Updated Vital Signs BP (!) 179/96   Pulse 100   Temp 98.1 F (36.7 C) (Oral)   Resp (!) 26   Ht 5\' 7"  (1.702 m)   Wt 68 kg   SpO2 93%   BMI 23.49 kg/m   Physical Exam  Constitutional: He appears well-developed and well-nourished. No distress.  HENT:  Head: Normocephalic and atraumatic.  I do not appreciate any facial or neck swelling.  Neck is supple.  No adenopathy.  Dentition looks fine.  Posterior pharynx is clear.  Normal sounding voice.  Eyes: Conjunctivae are normal. Right eye exhibits no discharge. Left eye  exhibits no discharge.  Neck: Neck supple.  Cardiovascular: Normal rate, regular rhythm and normal heart sounds. Exam reveals no gallop and no friction rub.  No murmur heard. Pulmonary/Chest: Effort normal and breath sounds normal. No respiratory distress.  Abdominal: Soft. He exhibits no distension. There is no tenderness.  Musculoskeletal: He exhibits no edema or tenderness.  Neurological: He is alert.  Skin: Skin is warm and dry.  Psychiatric: He has a normal mood and affect. His behavior is normal. Thought content normal.  Nursing note and vitals reviewed.    ED Treatments / Results  Labs (all labs ordered are listed, but only abnormal results are displayed) Labs Reviewed  CBC WITH DIFFERENTIAL/PLATELET - Abnormal; Notable for the following components:      Result Value   Hemoglobin 10.7 (*)    HCT 37.8 (*)    MCH 22.9 (*)    MCHC 28.3 (*)    RDW 20.4 (*)    All other components within normal limits  BASIC METABOLIC PANEL - Abnormal; Notable for the following components:   Potassium 3.4 (*)    Chloride 96 (*)    Glucose, Bld 335 (*)    BUN 30 (*)    Creatinine, Ser 5.55 (*)    GFR calc non Af Amer 10 (*)    GFR calc Af Amer 12 (*)    Anion gap 19 (*)    All other components within normal limits     EKG None  Radiology No results found.  Procedures Procedures (including critical care time)  Medications Ordered in ED Medications  chlorproMAZINE (THORAZINE) tablet 25 mg (25 mg Oral Given 10/23/18 1850)  promethazine (PHENERGAN) tablet 12.5 mg (12.5 mg Oral Given 10/23/18 1850)     Initial Impression / Assessment and Plan / ED Course  I have reviewed the triage vital signs and the nursing notes.  Pertinent labs & imaging results that were available during my care of the patient were reviewed by me and considered in my medical decision making (see chart for details).     62 year old male with nausea.  Benign abdominal exam.  Several vague complaints.  At times fairly unremarkable in light of his known end-stage renal disease.  I do not appreciate any facial/neck swelling on my exam.  His HEENT exam is reassuring.  Advised to continue to monitor for these symptoms.  I doubt emergent process.  Return precautions were discussed.  Final Clinical Impressions(s) / ED Diagnoses   Final diagnoses:  Nausea    ED Discharge Orders         Ordered    ondansetron (ZOFRAN ODT) 4 MG disintegrating tablet  Every 8 hours PRN     10/23/18 2224           Virgel Manifold, MD 10/30/18 1955

## 2018-10-31 DIAGNOSIS — N2581 Secondary hyperparathyroidism of renal origin: Secondary | ICD-10-CM | POA: Diagnosis not present

## 2018-10-31 DIAGNOSIS — D649 Anemia, unspecified: Secondary | ICD-10-CM | POA: Diagnosis not present

## 2018-10-31 DIAGNOSIS — N186 End stage renal disease: Secondary | ICD-10-CM | POA: Diagnosis not present

## 2018-10-31 DIAGNOSIS — D509 Iron deficiency anemia, unspecified: Secondary | ICD-10-CM | POA: Diagnosis not present

## 2018-10-31 DIAGNOSIS — E1122 Type 2 diabetes mellitus with diabetic chronic kidney disease: Secondary | ICD-10-CM | POA: Diagnosis not present

## 2018-11-02 DIAGNOSIS — E1122 Type 2 diabetes mellitus with diabetic chronic kidney disease: Secondary | ICD-10-CM | POA: Diagnosis not present

## 2018-11-02 DIAGNOSIS — N186 End stage renal disease: Secondary | ICD-10-CM | POA: Diagnosis not present

## 2018-11-02 DIAGNOSIS — D649 Anemia, unspecified: Secondary | ICD-10-CM | POA: Diagnosis not present

## 2018-11-02 DIAGNOSIS — D509 Iron deficiency anemia, unspecified: Secondary | ICD-10-CM | POA: Diagnosis not present

## 2018-11-02 DIAGNOSIS — N2581 Secondary hyperparathyroidism of renal origin: Secondary | ICD-10-CM | POA: Diagnosis not present

## 2018-11-05 DIAGNOSIS — N2581 Secondary hyperparathyroidism of renal origin: Secondary | ICD-10-CM | POA: Diagnosis not present

## 2018-11-05 DIAGNOSIS — N186 End stage renal disease: Secondary | ICD-10-CM | POA: Diagnosis not present

## 2018-11-05 DIAGNOSIS — E1122 Type 2 diabetes mellitus with diabetic chronic kidney disease: Secondary | ICD-10-CM | POA: Diagnosis not present

## 2018-11-05 DIAGNOSIS — D649 Anemia, unspecified: Secondary | ICD-10-CM | POA: Diagnosis not present

## 2018-11-05 DIAGNOSIS — D509 Iron deficiency anemia, unspecified: Secondary | ICD-10-CM | POA: Diagnosis not present

## 2018-11-07 DIAGNOSIS — E1122 Type 2 diabetes mellitus with diabetic chronic kidney disease: Secondary | ICD-10-CM | POA: Diagnosis not present

## 2018-11-07 DIAGNOSIS — D509 Iron deficiency anemia, unspecified: Secondary | ICD-10-CM | POA: Diagnosis not present

## 2018-11-07 DIAGNOSIS — N186 End stage renal disease: Secondary | ICD-10-CM | POA: Diagnosis not present

## 2018-11-07 DIAGNOSIS — D649 Anemia, unspecified: Secondary | ICD-10-CM | POA: Diagnosis not present

## 2018-11-07 DIAGNOSIS — N2581 Secondary hyperparathyroidism of renal origin: Secondary | ICD-10-CM | POA: Diagnosis not present

## 2018-11-09 DIAGNOSIS — N186 End stage renal disease: Secondary | ICD-10-CM | POA: Diagnosis not present

## 2018-11-09 DIAGNOSIS — D649 Anemia, unspecified: Secondary | ICD-10-CM | POA: Diagnosis not present

## 2018-11-09 DIAGNOSIS — E1122 Type 2 diabetes mellitus with diabetic chronic kidney disease: Secondary | ICD-10-CM | POA: Diagnosis not present

## 2018-11-09 DIAGNOSIS — N2581 Secondary hyperparathyroidism of renal origin: Secondary | ICD-10-CM | POA: Diagnosis not present

## 2018-11-09 DIAGNOSIS — D509 Iron deficiency anemia, unspecified: Secondary | ICD-10-CM | POA: Diagnosis not present

## 2018-11-12 ENCOUNTER — Other Ambulatory Visit: Payer: Self-pay | Admitting: Adult Health

## 2018-11-12 DIAGNOSIS — N2581 Secondary hyperparathyroidism of renal origin: Secondary | ICD-10-CM | POA: Diagnosis not present

## 2018-11-12 DIAGNOSIS — D509 Iron deficiency anemia, unspecified: Secondary | ICD-10-CM | POA: Diagnosis not present

## 2018-11-12 DIAGNOSIS — N186 End stage renal disease: Secondary | ICD-10-CM | POA: Diagnosis not present

## 2018-11-12 DIAGNOSIS — D649 Anemia, unspecified: Secondary | ICD-10-CM | POA: Diagnosis not present

## 2018-11-12 DIAGNOSIS — E1122 Type 2 diabetes mellitus with diabetic chronic kidney disease: Secondary | ICD-10-CM | POA: Diagnosis not present

## 2018-11-14 DIAGNOSIS — E1122 Type 2 diabetes mellitus with diabetic chronic kidney disease: Secondary | ICD-10-CM | POA: Diagnosis not present

## 2018-11-14 DIAGNOSIS — D649 Anemia, unspecified: Secondary | ICD-10-CM | POA: Diagnosis not present

## 2018-11-14 DIAGNOSIS — N186 End stage renal disease: Secondary | ICD-10-CM | POA: Diagnosis not present

## 2018-11-14 DIAGNOSIS — N2581 Secondary hyperparathyroidism of renal origin: Secondary | ICD-10-CM | POA: Diagnosis not present

## 2018-11-14 DIAGNOSIS — D509 Iron deficiency anemia, unspecified: Secondary | ICD-10-CM | POA: Diagnosis not present

## 2018-11-16 DIAGNOSIS — N186 End stage renal disease: Secondary | ICD-10-CM | POA: Diagnosis not present

## 2018-11-16 DIAGNOSIS — D649 Anemia, unspecified: Secondary | ICD-10-CM | POA: Diagnosis not present

## 2018-11-16 DIAGNOSIS — N2581 Secondary hyperparathyroidism of renal origin: Secondary | ICD-10-CM | POA: Diagnosis not present

## 2018-11-16 DIAGNOSIS — D509 Iron deficiency anemia, unspecified: Secondary | ICD-10-CM | POA: Diagnosis not present

## 2018-11-16 DIAGNOSIS — E1122 Type 2 diabetes mellitus with diabetic chronic kidney disease: Secondary | ICD-10-CM | POA: Diagnosis not present

## 2018-11-18 DIAGNOSIS — E1122 Type 2 diabetes mellitus with diabetic chronic kidney disease: Secondary | ICD-10-CM | POA: Diagnosis not present

## 2018-11-18 DIAGNOSIS — D649 Anemia, unspecified: Secondary | ICD-10-CM | POA: Diagnosis not present

## 2018-11-18 DIAGNOSIS — D509 Iron deficiency anemia, unspecified: Secondary | ICD-10-CM | POA: Diagnosis not present

## 2018-11-18 DIAGNOSIS — N2581 Secondary hyperparathyroidism of renal origin: Secondary | ICD-10-CM | POA: Diagnosis not present

## 2018-11-18 DIAGNOSIS — N186 End stage renal disease: Secondary | ICD-10-CM | POA: Diagnosis not present

## 2018-11-21 DIAGNOSIS — E1122 Type 2 diabetes mellitus with diabetic chronic kidney disease: Secondary | ICD-10-CM | POA: Diagnosis not present

## 2018-11-21 DIAGNOSIS — N186 End stage renal disease: Secondary | ICD-10-CM | POA: Diagnosis not present

## 2018-11-21 DIAGNOSIS — D509 Iron deficiency anemia, unspecified: Secondary | ICD-10-CM | POA: Diagnosis not present

## 2018-11-21 DIAGNOSIS — N2581 Secondary hyperparathyroidism of renal origin: Secondary | ICD-10-CM | POA: Diagnosis not present

## 2018-11-21 DIAGNOSIS — D649 Anemia, unspecified: Secondary | ICD-10-CM | POA: Diagnosis not present

## 2018-11-23 DIAGNOSIS — N2581 Secondary hyperparathyroidism of renal origin: Secondary | ICD-10-CM | POA: Diagnosis not present

## 2018-11-23 DIAGNOSIS — E1122 Type 2 diabetes mellitus with diabetic chronic kidney disease: Secondary | ICD-10-CM | POA: Diagnosis not present

## 2018-11-23 DIAGNOSIS — N186 End stage renal disease: Secondary | ICD-10-CM | POA: Diagnosis not present

## 2018-11-23 DIAGNOSIS — D649 Anemia, unspecified: Secondary | ICD-10-CM | POA: Diagnosis not present

## 2018-11-23 DIAGNOSIS — D509 Iron deficiency anemia, unspecified: Secondary | ICD-10-CM | POA: Diagnosis not present

## 2018-11-25 DIAGNOSIS — D509 Iron deficiency anemia, unspecified: Secondary | ICD-10-CM | POA: Diagnosis not present

## 2018-11-25 DIAGNOSIS — D649 Anemia, unspecified: Secondary | ICD-10-CM | POA: Diagnosis not present

## 2018-11-25 DIAGNOSIS — E1122 Type 2 diabetes mellitus with diabetic chronic kidney disease: Secondary | ICD-10-CM | POA: Diagnosis not present

## 2018-11-25 DIAGNOSIS — N186 End stage renal disease: Secondary | ICD-10-CM | POA: Diagnosis not present

## 2018-11-25 DIAGNOSIS — N2581 Secondary hyperparathyroidism of renal origin: Secondary | ICD-10-CM | POA: Diagnosis not present

## 2018-11-27 DIAGNOSIS — N186 End stage renal disease: Secondary | ICD-10-CM | POA: Diagnosis not present

## 2018-11-27 DIAGNOSIS — I129 Hypertensive chronic kidney disease with stage 1 through stage 4 chronic kidney disease, or unspecified chronic kidney disease: Secondary | ICD-10-CM | POA: Diagnosis not present

## 2018-11-27 DIAGNOSIS — Z992 Dependence on renal dialysis: Secondary | ICD-10-CM | POA: Diagnosis not present

## 2018-11-28 DIAGNOSIS — N186 End stage renal disease: Secondary | ICD-10-CM | POA: Diagnosis not present

## 2018-11-28 DIAGNOSIS — E1122 Type 2 diabetes mellitus with diabetic chronic kidney disease: Secondary | ICD-10-CM | POA: Diagnosis not present

## 2018-11-28 DIAGNOSIS — D649 Anemia, unspecified: Secondary | ICD-10-CM | POA: Diagnosis not present

## 2018-11-28 DIAGNOSIS — N2581 Secondary hyperparathyroidism of renal origin: Secondary | ICD-10-CM | POA: Diagnosis not present

## 2018-11-28 DIAGNOSIS — D509 Iron deficiency anemia, unspecified: Secondary | ICD-10-CM | POA: Diagnosis not present

## 2018-11-30 DIAGNOSIS — D509 Iron deficiency anemia, unspecified: Secondary | ICD-10-CM | POA: Diagnosis not present

## 2018-11-30 DIAGNOSIS — D649 Anemia, unspecified: Secondary | ICD-10-CM | POA: Diagnosis not present

## 2018-11-30 DIAGNOSIS — N186 End stage renal disease: Secondary | ICD-10-CM | POA: Diagnosis not present

## 2018-11-30 DIAGNOSIS — N2581 Secondary hyperparathyroidism of renal origin: Secondary | ICD-10-CM | POA: Diagnosis not present

## 2018-11-30 DIAGNOSIS — E1122 Type 2 diabetes mellitus with diabetic chronic kidney disease: Secondary | ICD-10-CM | POA: Diagnosis not present

## 2018-12-02 DIAGNOSIS — N185 Chronic kidney disease, stage 5: Secondary | ICD-10-CM | POA: Diagnosis not present

## 2018-12-02 DIAGNOSIS — E118 Type 2 diabetes mellitus with unspecified complications: Secondary | ICD-10-CM | POA: Diagnosis not present

## 2018-12-02 DIAGNOSIS — Z6824 Body mass index (BMI) 24.0-24.9, adult: Secondary | ICD-10-CM | POA: Diagnosis not present

## 2018-12-02 DIAGNOSIS — E1165 Type 2 diabetes mellitus with hyperglycemia: Secondary | ICD-10-CM | POA: Diagnosis not present

## 2018-12-03 DIAGNOSIS — N186 End stage renal disease: Secondary | ICD-10-CM | POA: Diagnosis not present

## 2018-12-03 DIAGNOSIS — E1122 Type 2 diabetes mellitus with diabetic chronic kidney disease: Secondary | ICD-10-CM | POA: Diagnosis not present

## 2018-12-03 DIAGNOSIS — D649 Anemia, unspecified: Secondary | ICD-10-CM | POA: Diagnosis not present

## 2018-12-03 DIAGNOSIS — D509 Iron deficiency anemia, unspecified: Secondary | ICD-10-CM | POA: Diagnosis not present

## 2018-12-03 DIAGNOSIS — N2581 Secondary hyperparathyroidism of renal origin: Secondary | ICD-10-CM | POA: Diagnosis not present

## 2018-12-05 DIAGNOSIS — E1122 Type 2 diabetes mellitus with diabetic chronic kidney disease: Secondary | ICD-10-CM | POA: Diagnosis not present

## 2018-12-05 DIAGNOSIS — D509 Iron deficiency anemia, unspecified: Secondary | ICD-10-CM | POA: Diagnosis not present

## 2018-12-05 DIAGNOSIS — N186 End stage renal disease: Secondary | ICD-10-CM | POA: Diagnosis not present

## 2018-12-05 DIAGNOSIS — D649 Anemia, unspecified: Secondary | ICD-10-CM | POA: Diagnosis not present

## 2018-12-05 DIAGNOSIS — N2581 Secondary hyperparathyroidism of renal origin: Secondary | ICD-10-CM | POA: Diagnosis not present

## 2018-12-05 NOTE — Progress Notes (Signed)
[name] [address] [place]. [place] [address] [phone]       OFFICE FOLLOW UP VISIT NOTE  Jared. Jared [name] Date of Birth:  December 18, 1955 Medical Record Number:  [id number]   Referring MD:  [name]  Reason for Referral:  Seizure  Chief Complaint  Patient presents with  . Follow-up    10 month follow up. Alone. Rm 9. No new concerns at this time.      HPI: Initial Consult : Jared [name] is a 63 year African-American male who had a episode of witnessed seizure in the hospital when he was admitted recently.Rogen Porte is an 63 y.o. male who presents to the ED via EMS after sudden onset of unresponsiveness at home.   His first symptom was severe headache, unknown if lateralized. Symptoms progressed to include right sided tingling, RUE jerking, leaning to the right and aphasia. He then, per family, leaned forward in his chair without complete loss of postural tone and started "jerking all over". At the time of EMS arrival, the jerking had stopped. On arrival to the ED, he began seizing. Seizure was generalized, with eyes deviated to the right and right > left jerking movements. Seizure was aborted with 2 mg IV Ativan.  CBG was 700 on arrival. Severely hypertensive and tachycardic as well.  Brief Neurological exam performed after Ativan administration and prior to intubation (premedicated with Etomidate 20mg  IV andRocuronium 80 mg IV, followed by propofol gtt at 10 mcg/kg/min)  Bu neurohospitalist Dr [name] revealed decreased tone in all 4 extremities with slightly more movement on the right relative to the left with noxious stimuli. Pupils were pinpoint and unreactive. Neck was supple. Sonorous breathing. Unarousable with sternal rub. Eyes were closed but remained partially open after eyelids manually raised. No doll's eye reflex was present. Reflexes were hypoactive x 4 with mute plantars.  Patient did not abuse alcohol or benzodiazepines per family.  EEG was abnormal but showed only mild generalized nonspecific slowing without definite epileptiform activity. MRI scan the brain did not show any acute infarct or other pathology. Patient was started on Keppra and is seizure was felt to be symptomatic from significantly elevated blood sugar of 700. Patient was initially started on Versed drip which was tapered. Patient was advised to taper his Keppra over a week which she has done. He has had no further recurrent seizures. 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 Patient was asked not to drive till he saw me but still has been doing some limited driving without any events. He has no prior history of seizures ,significant head injury with loss of consciousness. He denies drinking significant amounts of alcohol or doing any street drugs. There is no family history of epilepsy. Update 12/04/2017 Jared Givens, NP ) He returns today after hospitalization.  The patient was recently hospitalized for metabolic encephalopathy.  He presented to the emergency room with hypertension and blood sugar greater than 1000.  The patient is also end-stage renal disease.  The patient had a questionable seizure event in the hospital.  He was evaluated by the hospital neurologist who recommended restarting Keppra 250 mg twice a day with the plan of eventually tapering off this medication in the future.  It was felt that the patient's seizure event was caused by encephalopathy r/t to hypertension and hyperglycemia.  The patient reports no additional seizure activity.  He has actually only been taking Keppra 250 mg daily.  He  returns today for an evaluation.  Update 02/05/2018 Dr [name]: He returns for follow-up after last visit with Jared [name] nurse practitioner 2 months ago. He continues to do well without any recurrent seizure episodes. He has reduced the dose of Keppra to 250 twice daily. He has a left groin hernia  and plans to undergo surgery on 02/19/18. Patient has no new complaints today.  Interval history 12/05/2018: Patient is being seen today for 81-month follow-up.  It was recommended to start tapering Keppra as normal brain MRI imaging, EEG and evidence of reversible causes.  On 04/29/2018, patient was admitted for breakthrough seizures and severe metabolic encephalopathy in the setting of severe hyperglycemia, severe sepsis with aspiration pneumonia, and malignant hypertension.  EEG abnormal due to moderate diffuse suppression and background slowing which can be seen with hypoxic/ischemic injury, toxic/metabolic encephalopathy, neurodegenerative disorders or medication effect.  No evidence of electrographic seizures and absence of epileptic discharges.  Keppra restarted at that time for seizure prevention.  He states since this time he has been doing well without any recurrent seizure activity.  He continues on Keppra 250 mg twice daily without reported side effects.  He continues to monitor his glucose levels approximately 8-10 times per day as he is fearful of having another episode of hyperglycemia.  He states his glucose levels have been averaging around 200 which he is happy with.  He continues to follow with his PCP for DM management.  No further concerns at this time.   ROS:   14 system review of systems is positive eye itching, eye redness, blurred vision, ear discharge and all other systems negative   PMH:  Past Medical History:  Diagnosis Date  . Anemia   . Cancer [facility])    early prostate cancer per patient  . Chronic kidney disease    on dialysis M-W-F  . Diabetes mellitus without complication ([place])    onset as adult  . GERD (gastroesophageal reflux disease)   . Hypertension   . Seizures ([place])    pt states he thinks this is bc of his blood sugar  . Stroke [facility])    patient denies  . Thyroid disease    hyperparathyroidism    Social History:  Social History   Socioeconomic History    . Marital status: Divorced    Spouse name: Not on file  . Number of children: Not on file  . Years of education: Not on file  . Highest education level: Not on file  Occupational History  . Not on file  Social Needs  . Financial resource strain: Not on file  . Food insecurity:    Worry: Not on file    Inability: Not on file  . Transportation needs:    Medical: Not on file    Non-medical: Not on file  Tobacco Use  . Smoking status: Former Smoker    Last attempt to quit: 05/11/1984    Years since quitting: 34.5  . Smokeless tobacco: Never Used  Substance and Sexual Activity  . Alcohol use: Yes    Alcohol/week: 1.0 standard drinks    Types: 1 Glasses of wine per week    Comment: occasional  . Drug use: No  . Sexual activity: Not on file  Lifestyle  . Physical activity:    Days per week: Not on file    Minutes per session: Not on file  . Stress: Not on file  Relationships  . Social connections:    Talks on phone: Not on  file    Gets together: Not on file    Attends religious service: Not on file    Active member of club or organization: Not on file    Attends meetings of clubs or organizations: Not on file    Relationship status: Not on file  . Intimate partner violence:    Fear of current or ex partner: Not on file    Emotionally abused: Not on file    Physically abused: Not on file    Forced sexual activity: Not on file  Other Topics Concern  . Not on file  Social History Narrative  . Not on file    Medications:   Current Outpatient Medications on File Prior to Visit  Medication Sig Dispense Refill  . amLODipine (NORVASC) 10 MG tablet Take 1 tablet (10 mg total) by mouth daily. 30 tablet 0  . atorvastatin (LIPITOR) 20 MG tablet TAKE 1 TABLET BY MOUTH ONCE DAILY 90 tablet 0  . b complex-vitamin c-folic acid (NEPHRO-VITE) 0.8 MG TABS tablet TAKE 1 TABLET BY MOUTH EVERY DAY (ON DIALYSIS DAYS, TAKE AFTER DIALYSIS TREATMENT)  4  . cinacalcet (SENSIPAR) 30 MG tablet  Take 30 mg by mouth daily.  3  . cloNIDine (CATAPRES) 0.3 MG tablet Take 0.3 mg by mouth 2 (two) times daily. as directed  5  . hydrALAZINE (APRESOLINE) 100 MG tablet Take 1 tablet (100 mg total) by mouth 3 (three) times daily. 90 tablet 0  . insulin NPH Human (NOVOLIN N) 100 UNIT/ML injection Inject 20 units every morning (Patient taking differently: Inject 30 Units into the skin daily before breakfast. ) 10 mL 5  . insulin regular (NOVOLIN R,HUMULIN R) 100 units/mL injection Inject into the skin.    Marland Kitchen isosorbide mononitrate (IMDUR) 30 MG 24 hr tablet Take 1 tablet (30 mg total) by mouth daily. 90 tablet 1  . levETIRAcetam (KEPPRA) 250 MG tablet TAKE 1 TABLET BY MOUTH TWICE DAILY 60 tablet 0  . metoprolol tartrate (LOPRESSOR) 100 MG tablet Take 1 tablet (100 mg total) by mouth 2 (two) times daily. 60 tablet 1  . ondansetron (ZOFRAN ODT) 4 MG disintegrating tablet Take 1 tablet (4 mg total) by mouth every 8 (eight) hours as needed for nausea or vomiting. (Patient not taking: Reported on 10/30/2018) 20 tablet 0  . pantoprazole (PROTONIX) 40 MG tablet TAKE 1 TABLET BY MOUTH ONCE DAILY AT 6AM (Patient taking differently: TAKE 1 TABLET BY MOUTH twice DAILy) 90 tablet 1  . promethazine-dextromethorphan (PROMETHAZINE-DM) 6.25-15 MG/5ML syrup TAKE 5 MLS BY MOUTH 4 TIMES DAILY AS NEEDED  0  . sevelamer carbonate (RENVELA) 800 MG tablet Take 3 tablets (2,400 mg total) by mouth 3 (three) times daily with meals. 240 tablet 1   No current facility-administered medications on file prior to visit.     Allergies:   Allergies  Allergen Reactions  . Penicillins Other (See Comments)    Tolerates cephalosporins  UNSPECIFIED REACTION  Has patient had a PCN reaction causing immediate rash, facial/tongue/throat swelling, SOB or lightheadedness with hypotension: Unk Has patient had a PCN reaction causing severe rash involving mucus membranes or skin necrosis: Unk Has patient had a PCN reaction that required  hospitalization: Unk Has patient had a PCN reaction occurring within the last 10 years: Unk If all of the above answers are "NO", then may proceed with Cephalosporin use.     Physical Exam General: Frail pleasant middle-aged African-American male, seated, in no evident distress Head: head normocephalic and atraumatic.  Neck: supple with no carotid or supraclavicular bruits Cardiovascular: regular rate and rhythm, no murmurs Musculoskeletal: no deformity Skin:  no rash/petichiae Vascular:  Normal pulses all extremities  Neurologic Exam Mental Status: Awake and fully alert. Oriented to place and time. Recent and remote memory intact. Attention span, concentration and fund of knowledge appropriate. Mood and affect appropriate.   Cranial Nerves: Fundoscopic exam reveals sharp disc margins. Pupils equal, briskly reactive to light. Extraocular movements full without nystagmus. Visual fields full to confrontation. Hearing intact. Facial sensation intact. Face, tongue, palate moves normally and symmetrically.  Motor: Normal bulk and tone. Normal strength in all tested extremity muscles. Sensory.: intact to touch , pinprick , position and vibratory sensation.  Coordination: Rapid alternating movements normal in all extremities. Finger-to-nose and heel-to-shin performed accurately bilaterally. Gait and Station: Arises from chair without difficulty. Stance is normal. Gait demonstrates normal stride length and balance . Able to heel, toe and tandem walk without difficulty.  Reflexes: 1+ and symmetric. Toes downgoing.      ASSESSMENT: Jared [name] is a 63 year old male with history of seizure provoked by hyperglycemia in 10/2016 and unfortunately had a recurrence of additional seizure episode provoked by hyperglycemia in 04/2018.  He is being seen today for follow-up visit without any additional seizure activity and satisfactory glucose levels.    PLAN: Continue Keppra 250 mg twice daily at this  time for seizure prevention.  It was discussed to have repeat EEG with possible discontinuation of Keppra but patient would like to hold off at this time and continue on Keppra in general he feels as though everything is stable and he continues to have satisfactory glucose levels.  Advised to continue to follow with PCP for DM management.  He will follow-up in 6 months time but advised to call earlier with any additional seizure activity or if he would like to repeat EEG with possible Keppra discontinuation during the interval time.   Greater than 50% time during this 25 minute  visit was spent on counseling and coordination of care about seizures, provoking triggers and answering questions   Venancio Poisson, AGNP-BC  [facility] [address] [place],  [address]  Phone [phone] Fax [phone] Note: This document was prepared with digital dictation and possible smart phrase technology. Any transcriptional errors that result from this process are unintentional.

## 2018-12-06 ENCOUNTER — Ambulatory Visit (INDEPENDENT_AMBULATORY_CARE_PROVIDER_SITE_OTHER): Payer: Medicare Other | Admitting: Adult Health

## 2018-12-06 ENCOUNTER — Encounter: Payer: Self-pay | Admitting: Adult Health

## 2018-12-06 VITALS — BP 166/84 | HR 72 | Ht 67.5 in | Wt 153.8 lb

## 2018-12-06 DIAGNOSIS — R569 Unspecified convulsions: Secondary | ICD-10-CM

## 2018-12-06 DIAGNOSIS — E1142 Type 2 diabetes mellitus with diabetic polyneuropathy: Secondary | ICD-10-CM | POA: Diagnosis not present

## 2018-12-06 MED ORDER — LEVETIRACETAM 250 MG PO TABS: 250.0000 mg | ORAL_TABLET | Freq: Two times a day (BID) | ORAL | 2 refills | Status: DC

## 2018-12-06 NOTE — Patient Instructions (Signed)
Your Plan:  Continue Keppra 250mg  twice a day at this time  We will continue keppra at this time until everything is more stable for you. When you are ready attempt to come off from the medication, we will repeat EEG to ensure this looks normal and then we can consider stopping medication.  Continue to monitor blood sugars - good job with keeping them in control!   Follow up in 6 months or call earlier if needed     Thank you for coming to see [place] at [facility]. I hope we have been able to provide you high quality care today.  You may receive a patient satisfaction survey over the next few weeks. We would appreciate your feedback and comments so that we may continue to improve ourselves and the health of our patients.

## 2018-12-07 DIAGNOSIS — E1122 Type 2 diabetes mellitus with diabetic chronic kidney disease: Secondary | ICD-10-CM | POA: Diagnosis not present

## 2018-12-07 DIAGNOSIS — N186 End stage renal disease: Secondary | ICD-10-CM | POA: Diagnosis not present

## 2018-12-07 DIAGNOSIS — D509 Iron deficiency anemia, unspecified: Secondary | ICD-10-CM | POA: Diagnosis not present

## 2018-12-07 DIAGNOSIS — D649 Anemia, unspecified: Secondary | ICD-10-CM | POA: Diagnosis not present

## 2018-12-07 DIAGNOSIS — N2581 Secondary hyperparathyroidism of renal origin: Secondary | ICD-10-CM | POA: Diagnosis not present

## 2018-12-09 DIAGNOSIS — I871 Compression of vein: Secondary | ICD-10-CM | POA: Diagnosis not present

## 2018-12-09 DIAGNOSIS — T82858A Stenosis of vascular prosthetic devices, implants and grafts, initial encounter: Secondary | ICD-10-CM | POA: Diagnosis not present

## 2018-12-09 DIAGNOSIS — N186 End stage renal disease: Secondary | ICD-10-CM | POA: Diagnosis not present

## 2018-12-09 DIAGNOSIS — Z992 Dependence on renal dialysis: Secondary | ICD-10-CM | POA: Diagnosis not present

## 2018-12-10 DIAGNOSIS — N2581 Secondary hyperparathyroidism of renal origin: Secondary | ICD-10-CM | POA: Diagnosis not present

## 2018-12-10 DIAGNOSIS — D649 Anemia, unspecified: Secondary | ICD-10-CM | POA: Diagnosis not present

## 2018-12-10 DIAGNOSIS — D509 Iron deficiency anemia, unspecified: Secondary | ICD-10-CM | POA: Diagnosis not present

## 2018-12-10 DIAGNOSIS — N186 End stage renal disease: Secondary | ICD-10-CM | POA: Diagnosis not present

## 2018-12-10 DIAGNOSIS — E1122 Type 2 diabetes mellitus with diabetic chronic kidney disease: Secondary | ICD-10-CM | POA: Diagnosis not present

## 2018-12-12 DIAGNOSIS — N186 End stage renal disease: Secondary | ICD-10-CM | POA: Diagnosis not present

## 2018-12-12 DIAGNOSIS — D649 Anemia, unspecified: Secondary | ICD-10-CM | POA: Diagnosis not present

## 2018-12-12 DIAGNOSIS — D509 Iron deficiency anemia, unspecified: Secondary | ICD-10-CM | POA: Diagnosis not present

## 2018-12-12 DIAGNOSIS — E1122 Type 2 diabetes mellitus with diabetic chronic kidney disease: Secondary | ICD-10-CM | POA: Diagnosis not present

## 2018-12-12 DIAGNOSIS — N2581 Secondary hyperparathyroidism of renal origin: Secondary | ICD-10-CM | POA: Diagnosis not present

## 2018-12-13 ENCOUNTER — Encounter: Payer: Self-pay | Admitting: Family Medicine

## 2018-12-13 ENCOUNTER — Other Ambulatory Visit: Payer: Self-pay | Admitting: Family Medicine

## 2018-12-13 ENCOUNTER — Ambulatory Visit (INDEPENDENT_AMBULATORY_CARE_PROVIDER_SITE_OTHER): Payer: Medicare Other | Admitting: Family Medicine

## 2018-12-13 ENCOUNTER — Ambulatory Visit
Admission: RE | Admit: 2018-12-13 | Discharge: 2018-12-13 | Disposition: A | Discharge status: Home / Self Care | Payer: Medicare Other | Source: Ambulatory Visit | Attending: Family Medicine | Admitting: Family Medicine

## 2018-12-13 VITALS — BP 124/74 | HR 75 | Temp 97.5°F | Resp 16 | Wt 149.2 lb

## 2018-12-13 DIAGNOSIS — W19XXXA Unspecified fall, initial encounter: Secondary | ICD-10-CM | POA: Diagnosis not present

## 2018-12-13 DIAGNOSIS — S6992XA Unspecified injury of left wrist, hand and finger(s), initial encounter: Secondary | ICD-10-CM

## 2018-12-13 DIAGNOSIS — R0789 Other chest pain: Secondary | ICD-10-CM

## 2018-12-13 DIAGNOSIS — R0781 Pleurodynia: Secondary | ICD-10-CM | POA: Diagnosis not present

## 2018-12-13 DIAGNOSIS — M898X9 Other specified disorders of bone, unspecified site: Secondary | ICD-10-CM

## 2018-12-13 DIAGNOSIS — R936 Abnormal findings on diagnostic imaging of limbs: Secondary | ICD-10-CM

## 2018-12-13 DIAGNOSIS — M8580 Other specified disorders of bone density and structure, unspecified site: Secondary | ICD-10-CM

## 2018-12-13 DIAGNOSIS — S299XXA Unspecified injury of thorax, initial encounter: Secondary | ICD-10-CM | POA: Diagnosis not present

## 2018-12-13 DIAGNOSIS — M79642 Pain in left hand: Secondary | ICD-10-CM | POA: Diagnosis not present

## 2018-12-13 NOTE — Progress Notes (Signed)
Subjective:    Patient ID: Knowledge Escandon, male    DOB: 24-Feb-1956, 63 y.o.   MRN: [id number]  HPI Chief Complaint  Patient presents with  . fell at [employer]    fell at [employer] on 12/11/18 and hurt finger and knee and left side   Here with complaints of left side pain, left thumb pain and right knee pain post fall.  States he tripped over a rug in [place] 2 days ago. Denies hitting his head and no LOC. Complains of left thumb pain and swelling as well as right knee pain. Knee pain has improved. He is now able to walk without significant pain. No locking, popping or giving away per patient.  He also complains of left side pain that is worse with deep inspiration, movement or palpation.   Denies fever, chills, dizziness, headache, neck pain, back pain, abdominal pain, N/V/D.    Reviewed allergies, medications, past medical, surgical, family, and social history.     Review of Systems Pertinent positives and negatives in the history of present illness.     Objective:   Physical Exam Constitutional:      General: He is not in acute distress. HENT:     Head: Atraumatic.     Mouth/Throat:     Mouth: Mucous membranes are moist.  Neck:     Musculoskeletal: Normal range of motion and neck supple. No muscular tenderness.  Cardiovascular:     Rate and Rhythm: Normal rate and regular rhythm.  Pulmonary:     Effort: Pulmonary effort is normal.     Breath sounds: Normal breath sounds.  Chest:       Comments: Left anterior ribs TTP. No crepitus Abdominal:     General: Abdomen is flat. Bowel sounds are normal.     Palpations: Abdomen is soft. There is no splenomegaly.     Tenderness: There is abdominal tenderness in the left upper quadrant. There is no guarding or rebound.  Musculoskeletal:     Left wrist: Normal.     Right knee: He exhibits normal range of motion, no swelling and normal patellar mobility. No tenderness found.     Left hand: He exhibits decreased range of motion,  tenderness and swelling. Normal sensation noted. Decreased strength noted. He exhibits thumb/finger opposition.     Comments: Left thumb with swelling, decreased ROM and strength. TTP to thumb and CMC joint. Right knee with abrasion, no laxity. Negative McMurrays.   Skin:    General: Skin is warm and dry.     Capillary Refill: Capillary refill takes less than 2 seconds.  Neurological:     Mental Status: He is alert and oriented to person, place, and time.     Sensory: Sensation is intact.     Motor: Motor function is intact.     Coordination: Coordination is intact.     Gait: Gait is intact.    BP 124/74   Pulse 75   Temp (!) 97.5 F (36.4 C) (Oral)   Resp 16   Wt 149 lb 3.2 oz (67.7 kg)   SpO2 98%   BMI 23.02 kg/m       Assessment & Plan:  Fall, initial encounter - Plan: DG Hand Complete Left, DG Chest 2 View, DG Ribs Unilateral Left  Injury of left thumb, initial encounter - Plan: DG Hand Complete Left  Left-sided chest wall pain - Plan: DG Chest 2 View, DG Ribs Unilateral Left  Rib tenderness - Plan: DG Ribs Unilateral Left  Is a 63 year old male who is here today due to a mechanical fall 2 days ago.  He is not in any acute distress.  I will send him for x-rays of his left hand, chest and left ribs.  He is quite tender over his rib cage.  Advised him to take Tylenol, use a pillow for support and he may also use ice or heat.  Follow-up pending x-ray results.  Strict precautions that if he worsens over the weekend that he should be seen at an urgent care or ED.  He verbalized understanding.

## 2018-12-13 NOTE — Progress Notes (Signed)
I agree with the above plan 

## 2018-12-13 NOTE — Patient Instructions (Addendum)
Go to [facility] for X rays of your chest, ribs on the left side and left hand.   We will call you with the results.   You may take Tylenol and use ice or heat for pain. Let me know if you are needing something other than this or if your symptoms are worsening. It may help to use a pillow on your left side with coughing, laughing or movements.   If you get worse tonight or over the weekend, go to the emergency department for further evaluation.

## 2018-12-14 DIAGNOSIS — D509 Iron deficiency anemia, unspecified: Secondary | ICD-10-CM | POA: Diagnosis not present

## 2018-12-14 DIAGNOSIS — E1122 Type 2 diabetes mellitus with diabetic chronic kidney disease: Secondary | ICD-10-CM | POA: Diagnosis not present

## 2018-12-14 DIAGNOSIS — N2581 Secondary hyperparathyroidism of renal origin: Secondary | ICD-10-CM | POA: Diagnosis not present

## 2018-12-14 DIAGNOSIS — N186 End stage renal disease: Secondary | ICD-10-CM | POA: Diagnosis not present

## 2018-12-14 DIAGNOSIS — D649 Anemia, unspecified: Secondary | ICD-10-CM | POA: Diagnosis not present

## 2018-12-17 DIAGNOSIS — D649 Anemia, unspecified: Secondary | ICD-10-CM | POA: Diagnosis not present

## 2018-12-17 DIAGNOSIS — E1122 Type 2 diabetes mellitus with diabetic chronic kidney disease: Secondary | ICD-10-CM | POA: Diagnosis not present

## 2018-12-17 DIAGNOSIS — D509 Iron deficiency anemia, unspecified: Secondary | ICD-10-CM | POA: Diagnosis not present

## 2018-12-17 DIAGNOSIS — N2581 Secondary hyperparathyroidism of renal origin: Secondary | ICD-10-CM | POA: Diagnosis not present

## 2018-12-17 DIAGNOSIS — N186 End stage renal disease: Secondary | ICD-10-CM | POA: Diagnosis not present

## 2018-12-18 ENCOUNTER — Ambulatory Visit (INDEPENDENT_AMBULATORY_CARE_PROVIDER_SITE_OTHER): Payer: Medicare Other | Admitting: Family Medicine

## 2018-12-18 ENCOUNTER — Encounter: Payer: Self-pay | Admitting: Family Medicine

## 2018-12-18 ENCOUNTER — Telehealth: Payer: Self-pay | Admitting: Family Medicine

## 2018-12-18 VITALS — BP 130/82 | HR 70 | Temp 97.5°F | Wt 151.4 lb

## 2018-12-18 DIAGNOSIS — Z9189 Other specified personal risk factors, not elsewhere classified: Secondary | ICD-10-CM | POA: Diagnosis not present

## 2018-12-18 DIAGNOSIS — R1012 Left upper quadrant pain: Secondary | ICD-10-CM | POA: Diagnosis not present

## 2018-12-18 DIAGNOSIS — S8991XD Unspecified injury of right lower leg, subsequent encounter: Secondary | ICD-10-CM

## 2018-12-18 DIAGNOSIS — Z87891 Personal history of nicotine dependence: Secondary | ICD-10-CM

## 2018-12-18 DIAGNOSIS — C61 Malignant neoplasm of prostate: Secondary | ICD-10-CM | POA: Diagnosis not present

## 2018-12-18 DIAGNOSIS — Z992 Dependence on renal dialysis: Secondary | ICD-10-CM | POA: Diagnosis not present

## 2018-12-18 DIAGNOSIS — R10814 Left lower quadrant abdominal tenderness: Secondary | ICD-10-CM | POA: Diagnosis not present

## 2018-12-18 DIAGNOSIS — R10812 Left upper quadrant abdominal tenderness: Secondary | ICD-10-CM | POA: Diagnosis not present

## 2018-12-18 DIAGNOSIS — N186 End stage renal disease: Secondary | ICD-10-CM

## 2018-12-18 DIAGNOSIS — W19XXXD Unspecified fall, subsequent encounter: Secondary | ICD-10-CM | POA: Diagnosis not present

## 2018-12-18 NOTE — Progress Notes (Signed)
Subjective:    Patient ID: Jared [name], male    DOB: 12/22/1955, 63 y.o.   MRN: [id number]  HPI Chief Complaint  Patient presents with  . follow-up    follow-up on pain- doing better with the pain. discuss possible bone density   States he is here today to follow-up on recent fall and injuries.  He was seen 5 days ago after he reported tripping and falling fall at a local [place] on 12/11/2018.   Continues to complain of left thumb pain and swelling with limited ROM.  States this is interfering with his ability to perform his daily tasks.   He also reports mild right lateral knee pain with certain movements only.  No locking, popping or giving away.   Reports left sided abdominal pain has actually gotten worse. Having pain with palpation and movement. No fever, chills, dizziness, headache, neck pain, back pain, chest pain, palpitations, N/V/D, urinary symptoms.   X-ray results from 12/13/2018 showed no acute abnormalities of his left hand, chest and left ribs.   EXAM: LEFT HAND - COMPLETE 3+ VIEW  FINDINGS: Diffuse osseous demineralization. Joint spaces preserved. No acute fracture, dislocation, or bone destruction.  Scattered atherosclerotic calcifications.  IMPRESSION: Diffuse osseous demineralization without definite acute bony abnormalities.  Electronically Signed   By: Lavonia Dana M.D.   On: 12/13/2018 13:12  Multiple risk factors for osteoporosis including  Prostate cancer. Getting treatment every 6 months by urologist.  ESRD on dialysis.  Former smoker.   Diabetes managed by endocrinologist.   Seeing nephrologist next week. Recent labs per patient. These are not on file.  T, Thu, Sat dialysis schedule   Reviewed allergies, medications, past medical, surgical, family, and social history.  Review of Systems Pertinent positives and negatives in the history of present illness.     Objective:   Physical Exam Constitutional:      General: He is not  in acute distress.    Appearance: Normal appearance. He is not ill-appearing.  HENT:     Head: Normocephalic and atraumatic.     Mouth/Throat:     Lips: Pink.     Mouth: Mucous membranes are moist.  Eyes:     General: Lids are normal.     Extraocular Movements: Extraocular movements intact.     Conjunctiva/sclera: Conjunctivae normal.  Neck:     Musculoskeletal: Normal range of motion and neck supple.  Cardiovascular:     Rate and Rhythm: Normal rate and regular rhythm.     Pulses: Normal pulses.  Pulmonary:     Effort: Pulmonary effort is normal.     Breath sounds: Normal breath sounds.  Chest:     Chest wall: No tenderness.  Abdominal:     General: Abdomen is flat. Bowel sounds are normal.     Palpations: Abdomen is soft.     Tenderness: There is abdominal tenderness in the left upper quadrant and left lower quadrant. There is no guarding or rebound.  Musculoskeletal:     Right knee: Normal.     Right lower leg: No edema.     Left lower leg: No edema.  Skin:    General: Skin is warm and dry.     Capillary Refill: Capillary refill takes less than 2 seconds.     Coloration: Skin is not pale.  Neurological:     Mental Status: He is alert. Mental status is at baseline.     Cranial Nerves: Cranial nerves are intact.     Sensory:  Sensation is intact.     Gait: Gait is intact.  Psychiatric:        Attention and Perception: Attention normal.        Mood and Affect: Mood normal.        Speech: Speech normal.    BP 130/82   Pulse 70   Temp (!) 97.5 F (36.4 C) (Oral)   Wt 151 lb 6.4 oz (68.7 kg)   BMI 23.36 kg/m        Assessment & Plan:  Left upper quadrant pain - Plan: US Abdomen Complete  Fall, subsequent encounter - Plan: Ambulatory referral to Orthopedic Surgery, US Abdomen Complete  Injury of right knee, subsequent encounter - Plan: Ambulatory referral to Orthopedic Surgery  Left upper quadrant abdominal tenderness without rebound tenderness - Plan: US  Abdomen Complete  Left lower quadrant abdominal tenderness without rebound tenderness - Plan: US Abdomen Complete  CKD (chronic kidney disease) stage V requiring chronic dialysis (HCC)  Prostate cancer (HCC)  At risk for bone density loss  Former smoker  DEXA was not ordered due to possible $275 cost to patient. He declines this and would like to ask orthopedist before proceeding.  Abdominal pain and tenderness- no red flag symptoms. [place] ordered.  Left thumb and right knee pain-referral to ortho. Continue conservative treatment.  Declines labs today. States he gets blood work done at all of his dialysis appointments and does not want to be "stuck here".  Continue following up with multiple specialists.

## 2018-12-18 NOTE — Patient Instructions (Signed)
You should receive a call from the orthopedist to schedule an appointment for your right knee and left thumb/hand.

## 2018-12-18 NOTE — Telephone Encounter (Signed)
Records received from [facility]. Sending back for review.

## 2018-12-19 DIAGNOSIS — N186 End stage renal disease: Secondary | ICD-10-CM | POA: Diagnosis not present

## 2018-12-19 DIAGNOSIS — N2581 Secondary hyperparathyroidism of renal origin: Secondary | ICD-10-CM | POA: Diagnosis not present

## 2018-12-19 DIAGNOSIS — E1122 Type 2 diabetes mellitus with diabetic chronic kidney disease: Secondary | ICD-10-CM | POA: Diagnosis not present

## 2018-12-19 DIAGNOSIS — D509 Iron deficiency anemia, unspecified: Secondary | ICD-10-CM | POA: Diagnosis not present

## 2018-12-19 DIAGNOSIS — D649 Anemia, unspecified: Secondary | ICD-10-CM | POA: Diagnosis not present

## 2018-12-21 DIAGNOSIS — N2581 Secondary hyperparathyroidism of renal origin: Secondary | ICD-10-CM | POA: Diagnosis not present

## 2018-12-21 DIAGNOSIS — N186 End stage renal disease: Secondary | ICD-10-CM | POA: Diagnosis not present

## 2018-12-21 DIAGNOSIS — D649 Anemia, unspecified: Secondary | ICD-10-CM | POA: Diagnosis not present

## 2018-12-21 DIAGNOSIS — E1122 Type 2 diabetes mellitus with diabetic chronic kidney disease: Secondary | ICD-10-CM | POA: Diagnosis not present

## 2018-12-21 DIAGNOSIS — D509 Iron deficiency anemia, unspecified: Secondary | ICD-10-CM | POA: Diagnosis not present

## 2018-12-23 ENCOUNTER — Ambulatory Visit
Admission: RE | Admit: 2018-12-23 | Discharge: 2018-12-23 | Disposition: A | Discharge status: Home / Self Care | Payer: Medicare Other | Source: Ambulatory Visit | Attending: Family Medicine | Admitting: Family Medicine

## 2018-12-23 DIAGNOSIS — N281 Cyst of kidney, acquired: Secondary | ICD-10-CM | POA: Diagnosis not present

## 2018-12-23 DIAGNOSIS — R10812 Left upper quadrant abdominal tenderness: Secondary | ICD-10-CM

## 2018-12-23 DIAGNOSIS — W19XXXD Unspecified fall, subsequent encounter: Secondary | ICD-10-CM

## 2018-12-23 DIAGNOSIS — R1012 Left upper quadrant pain: Secondary | ICD-10-CM

## 2018-12-23 DIAGNOSIS — R10814 Left lower quadrant abdominal tenderness: Secondary | ICD-10-CM

## 2018-12-24 ENCOUNTER — Telehealth: Payer: Self-pay | Admitting: Internal Medicine

## 2018-12-24 DIAGNOSIS — N186 End stage renal disease: Secondary | ICD-10-CM | POA: Diagnosis not present

## 2018-12-24 DIAGNOSIS — D649 Anemia, unspecified: Secondary | ICD-10-CM | POA: Diagnosis not present

## 2018-12-24 DIAGNOSIS — E1122 Type 2 diabetes mellitus with diabetic chronic kidney disease: Secondary | ICD-10-CM | POA: Diagnosis not present

## 2018-12-24 DIAGNOSIS — D509 Iron deficiency anemia, unspecified: Secondary | ICD-10-CM | POA: Diagnosis not present

## 2018-12-24 DIAGNOSIS — N2581 Secondary hyperparathyroidism of renal origin: Secondary | ICD-10-CM | POA: Diagnosis not present

## 2018-12-24 NOTE — Telephone Encounter (Signed)
Called pt to advise him that U/S showed a lot of fluid in the abdomen and this needed to be sent to Dr. Lorrene Reid to find out what they want to do about this.  I have faxed over STAT message to Dr. Lorrene Reid at [facility] and pt was advised that they will await a call from [facility].

## 2018-12-25 ENCOUNTER — Encounter: Payer: Self-pay | Admitting: Family Medicine

## 2018-12-25 NOTE — Progress Notes (Signed)
I received a phone call from Dr. Lorrene Reid concerning the ascites.  He has had this in the past and it usually indicates fluid overload.  Her recommendation was to get another ultrasound in a month and reassess based on that.

## 2018-12-26 DIAGNOSIS — N186 End stage renal disease: Secondary | ICD-10-CM | POA: Diagnosis not present

## 2018-12-26 DIAGNOSIS — D649 Anemia, unspecified: Secondary | ICD-10-CM | POA: Diagnosis not present

## 2018-12-26 DIAGNOSIS — D509 Iron deficiency anemia, unspecified: Secondary | ICD-10-CM | POA: Diagnosis not present

## 2018-12-26 DIAGNOSIS — E1122 Type 2 diabetes mellitus with diabetic chronic kidney disease: Secondary | ICD-10-CM | POA: Diagnosis not present

## 2018-12-26 DIAGNOSIS — N2581 Secondary hyperparathyroidism of renal origin: Secondary | ICD-10-CM | POA: Diagnosis not present

## 2018-12-28 DIAGNOSIS — I129 Hypertensive chronic kidney disease with stage 1 through stage 4 chronic kidney disease, or unspecified chronic kidney disease: Secondary | ICD-10-CM | POA: Diagnosis not present

## 2018-12-28 DIAGNOSIS — D649 Anemia, unspecified: Secondary | ICD-10-CM | POA: Diagnosis not present

## 2018-12-28 DIAGNOSIS — E1122 Type 2 diabetes mellitus with diabetic chronic kidney disease: Secondary | ICD-10-CM | POA: Diagnosis not present

## 2018-12-28 DIAGNOSIS — N186 End stage renal disease: Secondary | ICD-10-CM | POA: Diagnosis not present

## 2018-12-28 DIAGNOSIS — Z992 Dependence on renal dialysis: Secondary | ICD-10-CM | POA: Diagnosis not present

## 2018-12-28 DIAGNOSIS — N2581 Secondary hyperparathyroidism of renal origin: Secondary | ICD-10-CM | POA: Diagnosis not present

## 2018-12-28 DIAGNOSIS — D509 Iron deficiency anemia, unspecified: Secondary | ICD-10-CM | POA: Diagnosis not present

## 2018-12-31 DIAGNOSIS — N2581 Secondary hyperparathyroidism of renal origin: Secondary | ICD-10-CM | POA: Diagnosis not present

## 2018-12-31 DIAGNOSIS — N186 End stage renal disease: Secondary | ICD-10-CM | POA: Diagnosis not present

## 2018-12-31 DIAGNOSIS — D649 Anemia, unspecified: Secondary | ICD-10-CM | POA: Diagnosis not present

## 2018-12-31 DIAGNOSIS — D509 Iron deficiency anemia, unspecified: Secondary | ICD-10-CM | POA: Diagnosis not present

## 2018-12-31 DIAGNOSIS — E1122 Type 2 diabetes mellitus with diabetic chronic kidney disease: Secondary | ICD-10-CM | POA: Diagnosis not present

## 2019-01-01 ENCOUNTER — Ambulatory Visit (INDEPENDENT_AMBULATORY_CARE_PROVIDER_SITE_OTHER): Payer: Self-pay | Admitting: Family Medicine

## 2019-01-02 DIAGNOSIS — N2581 Secondary hyperparathyroidism of renal origin: Secondary | ICD-10-CM | POA: Diagnosis not present

## 2019-01-02 DIAGNOSIS — E1122 Type 2 diabetes mellitus with diabetic chronic kidney disease: Secondary | ICD-10-CM | POA: Diagnosis not present

## 2019-01-02 DIAGNOSIS — D649 Anemia, unspecified: Secondary | ICD-10-CM | POA: Diagnosis not present

## 2019-01-02 DIAGNOSIS — N186 End stage renal disease: Secondary | ICD-10-CM | POA: Diagnosis not present

## 2019-01-02 DIAGNOSIS — D509 Iron deficiency anemia, unspecified: Secondary | ICD-10-CM | POA: Diagnosis not present

## 2019-01-04 DIAGNOSIS — D649 Anemia, unspecified: Secondary | ICD-10-CM | POA: Diagnosis not present

## 2019-01-04 DIAGNOSIS — N186 End stage renal disease: Secondary | ICD-10-CM | POA: Diagnosis not present

## 2019-01-04 DIAGNOSIS — D509 Iron deficiency anemia, unspecified: Secondary | ICD-10-CM | POA: Diagnosis not present

## 2019-01-04 DIAGNOSIS — N2581 Secondary hyperparathyroidism of renal origin: Secondary | ICD-10-CM | POA: Diagnosis not present

## 2019-01-04 DIAGNOSIS — E1122 Type 2 diabetes mellitus with diabetic chronic kidney disease: Secondary | ICD-10-CM | POA: Diagnosis not present

## 2019-01-06 ENCOUNTER — Encounter: Payer: Self-pay | Admitting: Internal Medicine

## 2019-01-07 DIAGNOSIS — D509 Iron deficiency anemia, unspecified: Secondary | ICD-10-CM | POA: Diagnosis not present

## 2019-01-07 DIAGNOSIS — E1122 Type 2 diabetes mellitus with diabetic chronic kidney disease: Secondary | ICD-10-CM | POA: Diagnosis not present

## 2019-01-07 DIAGNOSIS — D649 Anemia, unspecified: Secondary | ICD-10-CM | POA: Diagnosis not present

## 2019-01-07 DIAGNOSIS — N2581 Secondary hyperparathyroidism of renal origin: Secondary | ICD-10-CM | POA: Diagnosis not present

## 2019-01-07 DIAGNOSIS — N186 End stage renal disease: Secondary | ICD-10-CM | POA: Diagnosis not present

## 2019-01-09 DIAGNOSIS — N2581 Secondary hyperparathyroidism of renal origin: Secondary | ICD-10-CM | POA: Diagnosis not present

## 2019-01-09 DIAGNOSIS — D649 Anemia, unspecified: Secondary | ICD-10-CM | POA: Diagnosis not present

## 2019-01-09 DIAGNOSIS — E1122 Type 2 diabetes mellitus with diabetic chronic kidney disease: Secondary | ICD-10-CM | POA: Diagnosis not present

## 2019-01-09 DIAGNOSIS — N186 End stage renal disease: Secondary | ICD-10-CM | POA: Diagnosis not present

## 2019-01-09 DIAGNOSIS — D509 Iron deficiency anemia, unspecified: Secondary | ICD-10-CM | POA: Diagnosis not present

## 2019-01-11 DIAGNOSIS — E1122 Type 2 diabetes mellitus with diabetic chronic kidney disease: Secondary | ICD-10-CM | POA: Diagnosis not present

## 2019-01-11 DIAGNOSIS — N2581 Secondary hyperparathyroidism of renal origin: Secondary | ICD-10-CM | POA: Diagnosis not present

## 2019-01-11 DIAGNOSIS — D649 Anemia, unspecified: Secondary | ICD-10-CM | POA: Diagnosis not present

## 2019-01-11 DIAGNOSIS — N186 End stage renal disease: Secondary | ICD-10-CM | POA: Diagnosis not present

## 2019-01-11 DIAGNOSIS — D509 Iron deficiency anemia, unspecified: Secondary | ICD-10-CM | POA: Diagnosis not present

## 2019-01-14 DIAGNOSIS — D509 Iron deficiency anemia, unspecified: Secondary | ICD-10-CM | POA: Diagnosis not present

## 2019-01-14 DIAGNOSIS — N186 End stage renal disease: Secondary | ICD-10-CM | POA: Diagnosis not present

## 2019-01-14 DIAGNOSIS — N2581 Secondary hyperparathyroidism of renal origin: Secondary | ICD-10-CM | POA: Diagnosis not present

## 2019-01-14 DIAGNOSIS — E1122 Type 2 diabetes mellitus with diabetic chronic kidney disease: Secondary | ICD-10-CM | POA: Diagnosis not present

## 2019-01-14 DIAGNOSIS — D649 Anemia, unspecified: Secondary | ICD-10-CM | POA: Diagnosis not present

## 2019-01-15 DIAGNOSIS — E8770 Fluid overload, unspecified: Secondary | ICD-10-CM | POA: Diagnosis not present

## 2019-01-15 DIAGNOSIS — N186 End stage renal disease: Secondary | ICD-10-CM | POA: Diagnosis not present

## 2019-01-15 DIAGNOSIS — N2581 Secondary hyperparathyroidism of renal origin: Secondary | ICD-10-CM | POA: Diagnosis not present

## 2019-01-17 DIAGNOSIS — N186 End stage renal disease: Secondary | ICD-10-CM | POA: Diagnosis not present

## 2019-01-17 DIAGNOSIS — C775 Secondary and unspecified malignant neoplasm of intrapelvic lymph nodes: Secondary | ICD-10-CM | POA: Diagnosis not present

## 2019-01-17 DIAGNOSIS — C61 Malignant neoplasm of prostate: Secondary | ICD-10-CM | POA: Diagnosis not present

## 2019-01-17 DIAGNOSIS — R310 Gross hematuria: Secondary | ICD-10-CM | POA: Diagnosis not present

## 2019-01-18 DIAGNOSIS — D649 Anemia, unspecified: Secondary | ICD-10-CM | POA: Diagnosis not present

## 2019-01-18 DIAGNOSIS — D509 Iron deficiency anemia, unspecified: Secondary | ICD-10-CM | POA: Diagnosis not present

## 2019-01-18 DIAGNOSIS — E1122 Type 2 diabetes mellitus with diabetic chronic kidney disease: Secondary | ICD-10-CM | POA: Diagnosis not present

## 2019-01-18 DIAGNOSIS — N186 End stage renal disease: Secondary | ICD-10-CM | POA: Diagnosis not present

## 2019-01-18 DIAGNOSIS — N2581 Secondary hyperparathyroidism of renal origin: Secondary | ICD-10-CM | POA: Diagnosis not present

## 2019-01-21 DIAGNOSIS — E1122 Type 2 diabetes mellitus with diabetic chronic kidney disease: Secondary | ICD-10-CM | POA: Diagnosis not present

## 2019-01-21 DIAGNOSIS — D649 Anemia, unspecified: Secondary | ICD-10-CM | POA: Diagnosis not present

## 2019-01-21 DIAGNOSIS — N186 End stage renal disease: Secondary | ICD-10-CM | POA: Diagnosis not present

## 2019-01-21 DIAGNOSIS — N2581 Secondary hyperparathyroidism of renal origin: Secondary | ICD-10-CM | POA: Diagnosis not present

## 2019-01-21 DIAGNOSIS — D509 Iron deficiency anemia, unspecified: Secondary | ICD-10-CM | POA: Diagnosis not present

## 2019-01-22 ENCOUNTER — Other Ambulatory Visit: Payer: Self-pay | Admitting: Nephrology

## 2019-01-22 ENCOUNTER — Encounter: Payer: Self-pay | Admitting: Family Medicine

## 2019-01-22 ENCOUNTER — Ambulatory Visit (INDEPENDENT_AMBULATORY_CARE_PROVIDER_SITE_OTHER): Payer: Medicare Other | Admitting: Family Medicine

## 2019-01-22 VITALS — BP 164/80 | HR 76 | Temp 97.2°F | Ht 67.0 in | Wt 147.0 lb

## 2019-01-22 DIAGNOSIS — R011 Cardiac murmur, unspecified: Secondary | ICD-10-CM

## 2019-01-22 DIAGNOSIS — Z992 Dependence on renal dialysis: Secondary | ICD-10-CM

## 2019-01-22 DIAGNOSIS — I10 Essential (primary) hypertension: Secondary | ICD-10-CM | POA: Diagnosis not present

## 2019-01-22 DIAGNOSIS — H6123 Impacted cerumen, bilateral: Secondary | ICD-10-CM

## 2019-01-22 DIAGNOSIS — R109 Unspecified abdominal pain: Secondary | ICD-10-CM | POA: Diagnosis not present

## 2019-01-22 DIAGNOSIS — N186 End stage renal disease: Secondary | ICD-10-CM | POA: Diagnosis not present

## 2019-01-22 DIAGNOSIS — R140 Abdominal distension (gaseous): Secondary | ICD-10-CM | POA: Diagnosis not present

## 2019-01-22 LAB — POCT URINALYSIS DIP (PROADVANTAGE DEVICE)
BILIRUBIN UA: NEGATIVE
BILIRUBIN UA: NEGATIVE mg/dL
LEUKOCYTES UA: NEGATIVE
Nitrite, UA: NEGATIVE
PH UA: 8.5 — AB (ref 5.0–8.0)
Protein Ur, POC: 100 mg/dL — AB
Specific Gravity, Urine: 1.015
Urobilinogen, Ur: NEGATIVE

## 2019-01-22 NOTE — Patient Instructions (Signed)
  I think that your pain today is a combination of muscular pain (for which you can try heating pad and continue tylenol as needed) and also possibly gas pain.  For your complaints of gas-- Consider trying to avoid lactose (using lactaid products, or avoiding milk/cheese, using soy or almondmilk instead). Try cutting back on gas-producing foods; cooked vegetables may not be as bad as raw ones.  You can consider trying to take Bean-o prior to a meal, to prevent gas, and/or use simethicone (Gas-X) as needed for gas pain. Check with your kidney doctors to make sure these over-the-counter medications are safe for you to take.  Seek care again if you develop worsening pain, fever, increased vomiting, blood or mucus in the stool, or other changes that concern you.    Abdominal Pain, Adult  Many things can cause belly (abdominal) pain. Most times, belly pain is not dangerous. Many cases of belly pain can be watched and treated at home. Sometimes belly pain is serious, though. Your doctor will try to find the cause of your belly pain. Follow these instructions at home:  Take over-the-counter and prescription medicines only as told by your doctor. Do not take medicines that help you poop (laxatives) unless told to by your doctor.  Drink enough fluid to keep your pee (urine) clear or pale yellow.  Watch your belly pain for any changes.  Keep all follow-up visits as told by your doctor. This is important. Contact a doctor if:  Your belly pain changes or gets worse.  You are not hungry, or you lose weight without trying.  You are having trouble pooping (constipated) or have watery poop (diarrhea) for more than 2-3 days.  You have pain when you pee or poop.  Your belly pain wakes you up at night.  Your pain gets worse with meals, after eating, or with certain foods.  You are throwing up and cannot keep anything down.  You have a fever. Get help right away if:  Your pain does not go away  as soon as your doctor says it should.  You cannot stop throwing up.  Your pain is only in areas of your belly, such as the right side or the left lower part of the belly.  You have bloody or black poop, or poop that looks like tar.  You have very bad pain, cramping, or bloating in your belly.  You have signs of not having enough fluid or water in your body (dehydration), such as: ? Dark pee, very little pee, or no pee. ? Cracked lips. ? Dry mouth. ? Sunken eyes. ? Sleepiness. ? Weakness. This information is not intended to replace advice given to you by your health care provider. Make sure you discuss any questions you have with your health care provider. Document Released: 05/01/2008 Document Revised: 06/02/2016 Document Reviewed: 04/26/2016 Elsevier Interactive Patient Education  2019 [employer].

## 2019-01-22 NOTE — Progress Notes (Signed)
Chief Complaint  Patient presents with  . Abdominal Pain    left sided abdominal pain x 3 days. Pain is mostly constant and dull. Laying down is the worst and then sitting. Seems like moving around is the best. Has tried tylenol and that seems to help but only temporarily.,   63 yo patient of [facility] with PMH for CKD on hemodialysis, HTN, diabetes and seizures who presents with 3d h/o left sided abdominal pain.  He reports this pain is different from the L sided pain he was recently seen for, which was related to a fall at [facility], where he injured his left ribs.    Current pain is different, starts at the groin, radiates up the left side, slightly at the back, and then to the LUQ.  Pain started 3 days ago, thinks he woke up with it, got worse as the day went on.  Tylenol helped, but didn't fix it. He hasn't tried heating pad.  He thought it was gas, and was just waiting to see if it would ease off. He reports today that pain seems worse in the morning, eases as he is up and about.  He thinks gas may be contributing, and has noticed the pain ease off after passing gas.   He complains of a lot of gas over the last year. +dairy. +raw vegetables (beans, spinach)  Ongoing issues with decrease appetite and acid reflux.  He vomited once since the pain started, but this isn't uncommon for him. Reports decreased appetite, weight loss. No fever or chills.   H/o issues with constipation started 7 months ago, better in the last 2 months. He is now having bowel movements daily, sometimes twice.  Denies straining, stools are soft.  Denies any blood in the stool or mucus.  He saw one drop of blood in the urine 4 days ago, none since then. He is on dialysis.  He reported that his current pain was 6/10. However, during exam, he denied any pain. Feels like this is different than the rib pain from the fall  Sugar this morning was 200; hasn't yet taken his NPH (usually takes it earlier in the morning).  Reports  that his sugar was low last night, had to eat something late (sugar noted in his urine today).  PMH, PSH, SH reviewed  Outpatient Encounter Medications as of 01/22/2019  Medication Sig Note  . amLODipine (NORVASC) 10 MG tablet Take 1 tablet (10 mg total) by mouth daily.   Marland Kitchen atorvastatin (LIPITOR) 20 MG tablet TAKE 1 TABLET BY MOUTH ONCE DAILY   . b complex-vitamin c-folic acid (NEPHRO-VITE) 0.8 MG TABS tablet TAKE 1 TABLET BY MOUTH EVERY DAY (ON DIALYSIS DAYS, TAKE AFTER DIALYSIS TREATMENT)   . cinacalcet (SENSIPAR) 30 MG tablet Take 30 mg by mouth daily. 10/30/2018: [facility], Th [facility]  . cloNIDine (CATAPRES) 0.3 MG tablet Take 0.3 mg by mouth 2 (two) times daily. as directed   . hydrALAZINE (APRESOLINE) 100 MG tablet Take 1 tablet (100 mg total) by mouth 3 (three) times daily.   . insulin NPH Human (NOVOLIN N) 100 UNIT/ML injection Inject 20 units every morning (Patient taking differently: Inject 30 Units into the skin daily before breakfast. )   . levETIRAcetam (KEPPRA) 250 MG tablet Take 1 tablet (250 mg total) by mouth 2 (two) times daily.   . metoprolol tartrate (LOPRESSOR) 100 MG tablet Take 1 tablet (100 mg total) by mouth 2 (two) times daily.   Marland Kitchen  pantoprazole (PROTONIX) 40 MG tablet TAKE 1 TABLET BY MOUTH ONCE DAILY AT 6AM (Patient taking differently: TAKE 1 TABLET BY MOUTH twice DAILy)   . sevelamer carbonate (RENVELA) 800 MG tablet Take 3 tablets (2,400 mg total) by mouth 3 (three) times daily with meals.   . [DISCONTINUED] LANTUS SOLOSTAR 100 UNIT/ML Solostar Pen INJECT 15 UNITS SUBCUTANEOUSLY ONCE DAILY   . ondansetron (ZOFRAN ODT) 4 MG disintegrating tablet Take 1 tablet (4 mg total) by mouth every 8 (eight) hours as needed for nausea or vomiting. (Patient not taking: Reported on 01/22/2019)   . promethazine-dextromethorphan (PROMETHAZINE-DM) 6.25-15 MG/5ML syrup TAKE 5 MLS BY MOUTH 4 TIMES DAILY AS NEEDED   . [DISCONTINUED] HUMALOG KWIKPEN 100 UNIT/ML KwikPen INJECT  5 UNITS SUBCUTANEOUSLY THREE TIMES DAILY BEFORE EACH MEAL   . [DISCONTINUED] insulin regular (NOVOLIN R,HUMULIN R) 100 units/mL injection Inject into the skin.   . [DISCONTINUED] isosorbide mononitrate (IMDUR) 30 MG 24 hr tablet Take 1 tablet (30 mg total) by mouth daily.    No facility-administered encounter medications on file as of 01/22/2019.    ROS: no fever, chills, headache, chest pain, shortness of breath. He reports occ dizziness when he gets up quickly.  He states this happened in the past and got better after ears cleaned.  Wants ears checked. Denies hearing loss or pain. Decreased appetite, nausea, some vomiting related to reflux, chronic.   PHYSICAL EXAM:  BP (!) 164/80   Pulse 76   Temp (!) 97.2 F (36.2 C) (Tympanic)   Ht 5\' 7"  (1.702 m)   Wt 147 lb (66.7 kg)   BMI 23.02 kg/m  Wt Readings from Last 3 Encounters:  01/22/19 147 lb (66.7 kg)  12/18/18 151 lb 6.4 oz (68.7 kg)  12/13/18 149 lb 3.2 oz (67.7 kg)   Well-appearing, pleasant male.  He has some mild discomfort with position changes only (on exam table). HEENT: conjunctiva and sclera are clear. Ears--nonobstructive cerumen noted bilaterally.  Neck: no lymphadenopathy or mass Back: no spinal tenderness or CVA tenderness.  No muscular tenderness or spasm. Heart: regular rate and rhythm. 3/6 holosysolic murmur loudest at LUSB Lungs: clear bilaterally Abdomen: soft. Minimal epigastric discomfort.  No organomegaly or mass. Normal bowel sounds.  No rebound, guarding.  Other than slight epigastric discomfort, he was completely nontender along the left abdomen--where he previously described being able to press and feel pain.  He also could not reproduce any pain, and denies any current discomfort. Extremities: no edema Psych: normal mood, affect, hygiene and grooming Neuro: alert and oriented, cranial nerves intact, normal gait  Urine dip: 500 glucose, 100 protein, trace blood. Negative lueks and  nitrite  ASSESSMENT/PLAN:  Abdominal pain, unspecified abdominal location - suspect MSK component (try heat, cont tylenol) and gas. diet reviewed. nontender on exam today. f/u if fever, worsening pain, other sx - Plan: POCT Urinalysis DIP (Proadvantage Device)  Gassiness - may contribute to his pain, as it eases off after passing gas. Reviewed diet in detail; to check with nephrologist before taking the OTC meds mentioned.   CKD (chronic kidney disease) stage V requiring chronic dialysis [facility])  Essential hypertension  Heart murmur - reviewed other providers notes, not mentioned, was loud today. reviewed echo. ?if louder due to worsening anemia--will get labs from nephro    Murmur not noted by others, very apparent to me today. Last echo 02/2017: - Left ventricle: The cavity size was normal. Systolic function was   normal. The estimated ejection fraction was in the range  of 60%   to 65%. Wall motion was normal; there were no regional wall   motion abnormalities. There was an increased relative   contribution of atrial contraction to ventricular filling.   Doppler parameters are consistent with abnormal left ventricular   relaxation (grade 1 diastolic dysfunction). - Aortic valve: Trileaflet; normal thickness, mildly calcified   leaflets. - Mitral valve: Mildly calcified annulus. - Atrial septum: There was increased thickness of the septum,   consistent with lipomatous hypertrophy. - Pulmonary arteries: Systolic pressure could not be accurately   estimated.  ?if could be related to anemia. We don't have any records from [place]. Lab Results  Component Value Date   WBC 4.7 10/23/2018   HGB 10.7 (L) 10/23/2018   HCT 37.8 (L) 10/23/2018   MCV 80.9 10/23/2018   PLT 222 10/23/2018   Will get labs send from nephrologist. Consider repeat echo.     I think that your pain today is a combination of muscular pain (for which you can try heating pad and continue tylenol as needed) and also  possibly gas pain.  For your complaints of gas-- Consider trying to avoid lactose (using lactaid products, or avoiding milk/cheese, using soy or almondmilk instead). Try cutting back on gas-producing foods; cooked vegetables may not be as bad as raw ones.  You can consider trying to take Bean-o prior to a meal, to prevent gas, and/or use simethicone (Gas-X) as needed for gas pain. Check with your kidney doctors to make sure these over-the-counter medications are safe for you to take.  Seek care again if you develop worsening pain, fever, increased vomiting, blood or mucus in the stool, or other changes that concern you.

## 2019-01-23 ENCOUNTER — Other Ambulatory Visit: Payer: Self-pay | Admitting: Chiropractic Medicine

## 2019-01-23 ENCOUNTER — Other Ambulatory Visit (HOSPITAL_COMMUNITY): Payer: Self-pay | Admitting: Chiropractic Medicine

## 2019-01-23 ENCOUNTER — Telehealth: Payer: Self-pay | Admitting: Internal Medicine

## 2019-01-23 DIAGNOSIS — R188 Other ascites: Secondary | ICD-10-CM

## 2019-01-23 DIAGNOSIS — D509 Iron deficiency anemia, unspecified: Secondary | ICD-10-CM | POA: Diagnosis not present

## 2019-01-23 DIAGNOSIS — N2581 Secondary hyperparathyroidism of renal origin: Secondary | ICD-10-CM | POA: Diagnosis not present

## 2019-01-23 DIAGNOSIS — D649 Anemia, unspecified: Secondary | ICD-10-CM | POA: Diagnosis not present

## 2019-01-23 DIAGNOSIS — N186 End stage renal disease: Secondary | ICD-10-CM | POA: Diagnosis not present

## 2019-01-23 DIAGNOSIS — E1122 Type 2 diabetes mellitus with diabetic chronic kidney disease: Secondary | ICD-10-CM | POA: Diagnosis not present

## 2019-01-23 NOTE — Telephone Encounter (Signed)
Pt has been seeing Dr. Madelin Rear at [facility]

## 2019-01-23 NOTE — Progress Notes (Signed)
Let's have him come in next week to recheck heart sounds. Please find out who is managing his diabetes. He should be seeing endocrinology.

## 2019-01-23 NOTE — Telephone Encounter (Signed)
Spoke to [place] pt's daughter and she will call back to let us know who pt sees for Endocrinologist for diabetes

## 2019-01-24 ENCOUNTER — Encounter: Payer: Self-pay | Admitting: Family Medicine

## 2019-01-25 DIAGNOSIS — N2581 Secondary hyperparathyroidism of renal origin: Secondary | ICD-10-CM | POA: Diagnosis not present

## 2019-01-25 DIAGNOSIS — N186 End stage renal disease: Secondary | ICD-10-CM | POA: Diagnosis not present

## 2019-01-25 DIAGNOSIS — E1122 Type 2 diabetes mellitus with diabetic chronic kidney disease: Secondary | ICD-10-CM | POA: Diagnosis not present

## 2019-01-25 DIAGNOSIS — D649 Anemia, unspecified: Secondary | ICD-10-CM | POA: Diagnosis not present

## 2019-01-25 DIAGNOSIS — D509 Iron deficiency anemia, unspecified: Secondary | ICD-10-CM | POA: Diagnosis not present

## 2019-01-26 DIAGNOSIS — Z992 Dependence on renal dialysis: Secondary | ICD-10-CM | POA: Diagnosis not present

## 2019-01-26 DIAGNOSIS — I129 Hypertensive chronic kidney disease with stage 1 through stage 4 chronic kidney disease, or unspecified chronic kidney disease: Secondary | ICD-10-CM | POA: Diagnosis not present

## 2019-01-26 DIAGNOSIS — N186 End stage renal disease: Secondary | ICD-10-CM | POA: Diagnosis not present

## 2019-01-27 DIAGNOSIS — E1165 Type 2 diabetes mellitus with hyperglycemia: Secondary | ICD-10-CM | POA: Diagnosis not present

## 2019-01-27 DIAGNOSIS — E118 Type 2 diabetes mellitus with unspecified complications: Secondary | ICD-10-CM | POA: Diagnosis not present

## 2019-01-27 DIAGNOSIS — N185 Chronic kidney disease, stage 5: Secondary | ICD-10-CM | POA: Diagnosis not present

## 2019-01-27 DIAGNOSIS — Z6824 Body mass index (BMI) 24.0-24.9, adult: Secondary | ICD-10-CM | POA: Diagnosis not present

## 2019-01-28 ENCOUNTER — Encounter: Payer: Self-pay | Admitting: Internal Medicine

## 2019-01-28 DIAGNOSIS — N2581 Secondary hyperparathyroidism of renal origin: Secondary | ICD-10-CM | POA: Diagnosis not present

## 2019-01-28 DIAGNOSIS — E1122 Type 2 diabetes mellitus with diabetic chronic kidney disease: Secondary | ICD-10-CM | POA: Diagnosis not present

## 2019-01-28 DIAGNOSIS — N186 End stage renal disease: Secondary | ICD-10-CM | POA: Diagnosis not present

## 2019-01-28 DIAGNOSIS — D649 Anemia, unspecified: Secondary | ICD-10-CM | POA: Diagnosis not present

## 2019-01-29 ENCOUNTER — Ambulatory Visit: Payer: Medicare Other | Admitting: Family Medicine

## 2019-01-30 ENCOUNTER — Other Ambulatory Visit: Payer: Self-pay | Admitting: Family Medicine

## 2019-01-30 DIAGNOSIS — N2581 Secondary hyperparathyroidism of renal origin: Secondary | ICD-10-CM | POA: Diagnosis not present

## 2019-01-30 DIAGNOSIS — D649 Anemia, unspecified: Secondary | ICD-10-CM | POA: Diagnosis not present

## 2019-01-30 DIAGNOSIS — E1122 Type 2 diabetes mellitus with diabetic chronic kidney disease: Secondary | ICD-10-CM | POA: Diagnosis not present

## 2019-01-30 DIAGNOSIS — N186 End stage renal disease: Secondary | ICD-10-CM | POA: Diagnosis not present

## 2019-01-30 NOTE — Telephone Encounter (Signed)
I do not see pt is currently on this med

## 2019-01-31 ENCOUNTER — Ambulatory Visit (HOSPITAL_COMMUNITY): Admission: RE | Admit: 2019-01-31 | Payer: Medicare Other | Source: Ambulatory Visit

## 2019-01-31 ENCOUNTER — Encounter (HOSPITAL_COMMUNITY): Payer: Self-pay

## 2019-02-01 DIAGNOSIS — E1122 Type 2 diabetes mellitus with diabetic chronic kidney disease: Secondary | ICD-10-CM | POA: Diagnosis not present

## 2019-02-01 DIAGNOSIS — N2581 Secondary hyperparathyroidism of renal origin: Secondary | ICD-10-CM | POA: Diagnosis not present

## 2019-02-01 DIAGNOSIS — N186 End stage renal disease: Secondary | ICD-10-CM | POA: Diagnosis not present

## 2019-02-01 DIAGNOSIS — D649 Anemia, unspecified: Secondary | ICD-10-CM | POA: Diagnosis not present

## 2019-02-03 ENCOUNTER — Ambulatory Visit: Payer: Medicare Other | Admitting: Family Medicine

## 2019-02-04 DIAGNOSIS — N186 End stage renal disease: Secondary | ICD-10-CM | POA: Diagnosis not present

## 2019-02-04 DIAGNOSIS — D649 Anemia, unspecified: Secondary | ICD-10-CM | POA: Diagnosis not present

## 2019-02-04 DIAGNOSIS — N2581 Secondary hyperparathyroidism of renal origin: Secondary | ICD-10-CM | POA: Diagnosis not present

## 2019-02-04 DIAGNOSIS — E1122 Type 2 diabetes mellitus with diabetic chronic kidney disease: Secondary | ICD-10-CM | POA: Diagnosis not present

## 2019-02-05 ENCOUNTER — Other Ambulatory Visit: Payer: Self-pay | Admitting: Family Medicine

## 2019-02-05 NOTE — Telephone Encounter (Signed)
Ok to refill for 30 days and then he needs a follow up

## 2019-02-05 NOTE — Telephone Encounter (Signed)
Pt showed up later to his appt the other day and said he would call back to reschedule. He did not want to schedule right now.

## 2019-02-05 NOTE — Telephone Encounter (Signed)
Is this okay to refill? 

## 2019-02-06 DIAGNOSIS — N186 End stage renal disease: Secondary | ICD-10-CM | POA: Diagnosis not present

## 2019-02-06 DIAGNOSIS — E1122 Type 2 diabetes mellitus with diabetic chronic kidney disease: Secondary | ICD-10-CM | POA: Diagnosis not present

## 2019-02-06 DIAGNOSIS — D649 Anemia, unspecified: Secondary | ICD-10-CM | POA: Diagnosis not present

## 2019-02-06 DIAGNOSIS — N2581 Secondary hyperparathyroidism of renal origin: Secondary | ICD-10-CM | POA: Diagnosis not present

## 2019-02-07 DIAGNOSIS — D649 Anemia, unspecified: Secondary | ICD-10-CM | POA: Diagnosis not present

## 2019-02-07 DIAGNOSIS — N2581 Secondary hyperparathyroidism of renal origin: Secondary | ICD-10-CM | POA: Diagnosis not present

## 2019-02-07 DIAGNOSIS — N186 End stage renal disease: Secondary | ICD-10-CM | POA: Diagnosis not present

## 2019-02-07 DIAGNOSIS — E1122 Type 2 diabetes mellitus with diabetic chronic kidney disease: Secondary | ICD-10-CM | POA: Diagnosis not present

## 2019-02-10 DIAGNOSIS — E118 Type 2 diabetes mellitus with unspecified complications: Secondary | ICD-10-CM | POA: Diagnosis not present

## 2019-02-10 DIAGNOSIS — E1165 Type 2 diabetes mellitus with hyperglycemia: Secondary | ICD-10-CM | POA: Diagnosis not present

## 2019-02-10 DIAGNOSIS — Z6824 Body mass index (BMI) 24.0-24.9, adult: Secondary | ICD-10-CM | POA: Diagnosis not present

## 2019-02-10 DIAGNOSIS — N185 Chronic kidney disease, stage 5: Secondary | ICD-10-CM | POA: Diagnosis not present

## 2019-02-11 DIAGNOSIS — D649 Anemia, unspecified: Secondary | ICD-10-CM | POA: Diagnosis not present

## 2019-02-11 DIAGNOSIS — N186 End stage renal disease: Secondary | ICD-10-CM | POA: Diagnosis not present

## 2019-02-11 DIAGNOSIS — E1122 Type 2 diabetes mellitus with diabetic chronic kidney disease: Secondary | ICD-10-CM | POA: Diagnosis not present

## 2019-02-11 DIAGNOSIS — N2581 Secondary hyperparathyroidism of renal origin: Secondary | ICD-10-CM | POA: Diagnosis not present

## 2019-02-13 DIAGNOSIS — E1122 Type 2 diabetes mellitus with diabetic chronic kidney disease: Secondary | ICD-10-CM | POA: Diagnosis not present

## 2019-02-13 DIAGNOSIS — D649 Anemia, unspecified: Secondary | ICD-10-CM | POA: Diagnosis not present

## 2019-02-13 DIAGNOSIS — N2581 Secondary hyperparathyroidism of renal origin: Secondary | ICD-10-CM | POA: Diagnosis not present

## 2019-02-13 DIAGNOSIS — N186 End stage renal disease: Secondary | ICD-10-CM | POA: Diagnosis not present

## 2019-02-15 DIAGNOSIS — N2581 Secondary hyperparathyroidism of renal origin: Secondary | ICD-10-CM | POA: Diagnosis not present

## 2019-02-15 DIAGNOSIS — N186 End stage renal disease: Secondary | ICD-10-CM | POA: Diagnosis not present

## 2019-02-15 DIAGNOSIS — E1122 Type 2 diabetes mellitus with diabetic chronic kidney disease: Secondary | ICD-10-CM | POA: Diagnosis not present

## 2019-02-15 DIAGNOSIS — D649 Anemia, unspecified: Secondary | ICD-10-CM | POA: Diagnosis not present

## 2019-02-18 DIAGNOSIS — D649 Anemia, unspecified: Secondary | ICD-10-CM | POA: Diagnosis not present

## 2019-02-18 DIAGNOSIS — N2581 Secondary hyperparathyroidism of renal origin: Secondary | ICD-10-CM | POA: Diagnosis not present

## 2019-02-18 DIAGNOSIS — E1122 Type 2 diabetes mellitus with diabetic chronic kidney disease: Secondary | ICD-10-CM | POA: Diagnosis not present

## 2019-02-18 DIAGNOSIS — N186 End stage renal disease: Secondary | ICD-10-CM | POA: Diagnosis not present

## 2019-02-20 DIAGNOSIS — E1122 Type 2 diabetes mellitus with diabetic chronic kidney disease: Secondary | ICD-10-CM | POA: Diagnosis not present

## 2019-02-20 DIAGNOSIS — N2581 Secondary hyperparathyroidism of renal origin: Secondary | ICD-10-CM | POA: Diagnosis not present

## 2019-02-20 DIAGNOSIS — D649 Anemia, unspecified: Secondary | ICD-10-CM | POA: Diagnosis not present

## 2019-02-20 DIAGNOSIS — N186 End stage renal disease: Secondary | ICD-10-CM | POA: Diagnosis not present

## 2019-02-22 DIAGNOSIS — N186 End stage renal disease: Secondary | ICD-10-CM | POA: Diagnosis not present

## 2019-02-22 DIAGNOSIS — D649 Anemia, unspecified: Secondary | ICD-10-CM | POA: Diagnosis not present

## 2019-02-22 DIAGNOSIS — N2581 Secondary hyperparathyroidism of renal origin: Secondary | ICD-10-CM | POA: Diagnosis not present

## 2019-02-22 DIAGNOSIS — E1122 Type 2 diabetes mellitus with diabetic chronic kidney disease: Secondary | ICD-10-CM | POA: Diagnosis not present

## 2019-02-25 DIAGNOSIS — D649 Anemia, unspecified: Secondary | ICD-10-CM | POA: Diagnosis not present

## 2019-02-25 DIAGNOSIS — N2581 Secondary hyperparathyroidism of renal origin: Secondary | ICD-10-CM | POA: Diagnosis not present

## 2019-02-25 DIAGNOSIS — E1122 Type 2 diabetes mellitus with diabetic chronic kidney disease: Secondary | ICD-10-CM | POA: Diagnosis not present

## 2019-02-25 DIAGNOSIS — N186 End stage renal disease: Secondary | ICD-10-CM | POA: Diagnosis not present

## 2019-02-26 DIAGNOSIS — Z992 Dependence on renal dialysis: Secondary | ICD-10-CM | POA: Diagnosis not present

## 2019-02-26 DIAGNOSIS — I129 Hypertensive chronic kidney disease with stage 1 through stage 4 chronic kidney disease, or unspecified chronic kidney disease: Secondary | ICD-10-CM | POA: Diagnosis not present

## 2019-02-26 DIAGNOSIS — N186 End stage renal disease: Secondary | ICD-10-CM | POA: Diagnosis not present

## 2019-02-27 DIAGNOSIS — E1122 Type 2 diabetes mellitus with diabetic chronic kidney disease: Secondary | ICD-10-CM | POA: Diagnosis not present

## 2019-02-27 DIAGNOSIS — N2581 Secondary hyperparathyroidism of renal origin: Secondary | ICD-10-CM | POA: Diagnosis not present

## 2019-02-27 DIAGNOSIS — N186 End stage renal disease: Secondary | ICD-10-CM | POA: Diagnosis not present

## 2019-02-27 DIAGNOSIS — D649 Anemia, unspecified: Secondary | ICD-10-CM | POA: Diagnosis not present

## 2019-03-01 DIAGNOSIS — D649 Anemia, unspecified: Secondary | ICD-10-CM | POA: Diagnosis not present

## 2019-03-01 DIAGNOSIS — E1122 Type 2 diabetes mellitus with diabetic chronic kidney disease: Secondary | ICD-10-CM | POA: Diagnosis not present

## 2019-03-01 DIAGNOSIS — N2581 Secondary hyperparathyroidism of renal origin: Secondary | ICD-10-CM | POA: Diagnosis not present

## 2019-03-01 DIAGNOSIS — N186 End stage renal disease: Secondary | ICD-10-CM | POA: Diagnosis not present

## 2019-03-02 ENCOUNTER — Other Ambulatory Visit: Payer: Self-pay | Admitting: Family Medicine

## 2019-03-03 NOTE — Telephone Encounter (Signed)
Patient states that BP is running 187/86 or there about.  Please advise.  Sorry about other message.

## 2019-03-03 NOTE — Telephone Encounter (Signed)
Please see my note about this medication and recommendations. Thanks

## 2019-03-03 NOTE — Telephone Encounter (Signed)
Is this ok to refill?  

## 2019-03-03 NOTE — Telephone Encounter (Signed)
I recommend he discuss his uncontrolled BP with his nephrologist. Ok to refill but this needs to be addressed. Please speak with patient and let him know that his BP is too high. He should have dialysis tomorrow. Thanks.

## 2019-03-03 NOTE — Telephone Encounter (Signed)
Please call and find out his BP readings at home. Ok to refill. He needs an in office appointment to follow up on questionable heart murmur heard by Dr. Tomi Bamberger at previous visit. This can be in 3-4 weeks when the virus has hopefully calmed down.

## 2019-03-04 DIAGNOSIS — N186 End stage renal disease: Secondary | ICD-10-CM | POA: Diagnosis not present

## 2019-03-04 DIAGNOSIS — N2581 Secondary hyperparathyroidism of renal origin: Secondary | ICD-10-CM | POA: Diagnosis not present

## 2019-03-04 DIAGNOSIS — D649 Anemia, unspecified: Secondary | ICD-10-CM | POA: Diagnosis not present

## 2019-03-04 DIAGNOSIS — E1122 Type 2 diabetes mellitus with diabetic chronic kidney disease: Secondary | ICD-10-CM | POA: Diagnosis not present

## 2019-03-04 NOTE — Telephone Encounter (Signed)
Pt will call me back

## 2019-03-05 DIAGNOSIS — E1165 Type 2 diabetes mellitus with hyperglycemia: Secondary | ICD-10-CM | POA: Diagnosis not present

## 2019-03-05 DIAGNOSIS — Z6822 Body mass index (BMI) 22.0-22.9, adult: Secondary | ICD-10-CM | POA: Diagnosis not present

## 2019-03-05 DIAGNOSIS — E118 Type 2 diabetes mellitus with unspecified complications: Secondary | ICD-10-CM | POA: Diagnosis not present

## 2019-03-05 DIAGNOSIS — N185 Chronic kidney disease, stage 5: Secondary | ICD-10-CM | POA: Diagnosis not present

## 2019-03-05 NOTE — Telephone Encounter (Signed)
Pt was headed to the doctor today and will discuss this with him. I will refill med for now

## 2019-03-06 DIAGNOSIS — D649 Anemia, unspecified: Secondary | ICD-10-CM | POA: Diagnosis not present

## 2019-03-06 DIAGNOSIS — E1122 Type 2 diabetes mellitus with diabetic chronic kidney disease: Secondary | ICD-10-CM | POA: Diagnosis not present

## 2019-03-06 DIAGNOSIS — N186 End stage renal disease: Secondary | ICD-10-CM | POA: Diagnosis not present

## 2019-03-06 DIAGNOSIS — N2581 Secondary hyperparathyroidism of renal origin: Secondary | ICD-10-CM | POA: Diagnosis not present

## 2019-03-08 DIAGNOSIS — D649 Anemia, unspecified: Secondary | ICD-10-CM | POA: Diagnosis not present

## 2019-03-08 DIAGNOSIS — E1122 Type 2 diabetes mellitus with diabetic chronic kidney disease: Secondary | ICD-10-CM | POA: Diagnosis not present

## 2019-03-08 DIAGNOSIS — N186 End stage renal disease: Secondary | ICD-10-CM | POA: Diagnosis not present

## 2019-03-08 DIAGNOSIS — N2581 Secondary hyperparathyroidism of renal origin: Secondary | ICD-10-CM | POA: Diagnosis not present

## 2019-03-11 DIAGNOSIS — D649 Anemia, unspecified: Secondary | ICD-10-CM | POA: Diagnosis not present

## 2019-03-11 DIAGNOSIS — N186 End stage renal disease: Secondary | ICD-10-CM | POA: Diagnosis not present

## 2019-03-11 DIAGNOSIS — N2581 Secondary hyperparathyroidism of renal origin: Secondary | ICD-10-CM | POA: Diagnosis not present

## 2019-03-11 DIAGNOSIS — E1122 Type 2 diabetes mellitus with diabetic chronic kidney disease: Secondary | ICD-10-CM | POA: Diagnosis not present

## 2019-03-12 DIAGNOSIS — C61 Malignant neoplasm of prostate: Secondary | ICD-10-CM | POA: Diagnosis not present

## 2019-03-13 DIAGNOSIS — E1122 Type 2 diabetes mellitus with diabetic chronic kidney disease: Secondary | ICD-10-CM | POA: Diagnosis not present

## 2019-03-13 DIAGNOSIS — D649 Anemia, unspecified: Secondary | ICD-10-CM | POA: Diagnosis not present

## 2019-03-13 DIAGNOSIS — N186 End stage renal disease: Secondary | ICD-10-CM | POA: Diagnosis not present

## 2019-03-13 DIAGNOSIS — N2581 Secondary hyperparathyroidism of renal origin: Secondary | ICD-10-CM | POA: Diagnosis not present

## 2019-03-15 DIAGNOSIS — E1122 Type 2 diabetes mellitus with diabetic chronic kidney disease: Secondary | ICD-10-CM | POA: Diagnosis not present

## 2019-03-15 DIAGNOSIS — D649 Anemia, unspecified: Secondary | ICD-10-CM | POA: Diagnosis not present

## 2019-03-15 DIAGNOSIS — N186 End stage renal disease: Secondary | ICD-10-CM | POA: Diagnosis not present

## 2019-03-15 DIAGNOSIS — N2581 Secondary hyperparathyroidism of renal origin: Secondary | ICD-10-CM | POA: Diagnosis not present

## 2019-03-17 DIAGNOSIS — N186 End stage renal disease: Secondary | ICD-10-CM | POA: Diagnosis not present

## 2019-03-17 DIAGNOSIS — C61 Malignant neoplasm of prostate: Secondary | ICD-10-CM | POA: Diagnosis not present

## 2019-03-17 DIAGNOSIS — R310 Gross hematuria: Secondary | ICD-10-CM | POA: Diagnosis not present

## 2019-03-17 DIAGNOSIS — N281 Cyst of kidney, acquired: Secondary | ICD-10-CM | POA: Diagnosis not present

## 2019-03-17 DIAGNOSIS — C775 Secondary and unspecified malignant neoplasm of intrapelvic lymph nodes: Secondary | ICD-10-CM | POA: Diagnosis not present

## 2019-03-18 DIAGNOSIS — D649 Anemia, unspecified: Secondary | ICD-10-CM | POA: Diagnosis not present

## 2019-03-18 DIAGNOSIS — N2581 Secondary hyperparathyroidism of renal origin: Secondary | ICD-10-CM | POA: Diagnosis not present

## 2019-03-18 DIAGNOSIS — N186 End stage renal disease: Secondary | ICD-10-CM | POA: Diagnosis not present

## 2019-03-18 DIAGNOSIS — E1122 Type 2 diabetes mellitus with diabetic chronic kidney disease: Secondary | ICD-10-CM | POA: Diagnosis not present

## 2019-03-19 ENCOUNTER — Other Ambulatory Visit: Payer: Self-pay | Admitting: *Deleted

## 2019-03-19 NOTE — Patient Outreach (Signed)
[name]) Care Management  03/19/2019  Jared [name] [birth year] [id number]    SCREENING REFERRAL  Initial outreach:  03/19/2019   Subjective: Initial unsuccessful telephone call to patient's preferred number in order to perform a screening.   Plan: Will reschedule another outreach call within 4 days. Will also send outreach letter accordingly.  Raina Mina, RN Care Management Coordinator [place] Office [phone]

## 2019-03-20 DIAGNOSIS — E1122 Type 2 diabetes mellitus with diabetic chronic kidney disease: Secondary | ICD-10-CM | POA: Diagnosis not present

## 2019-03-20 DIAGNOSIS — N2581 Secondary hyperparathyroidism of renal origin: Secondary | ICD-10-CM | POA: Diagnosis not present

## 2019-03-20 DIAGNOSIS — N186 End stage renal disease: Secondary | ICD-10-CM | POA: Diagnosis not present

## 2019-03-20 DIAGNOSIS — D649 Anemia, unspecified: Secondary | ICD-10-CM | POA: Diagnosis not present

## 2019-03-22 DIAGNOSIS — N2581 Secondary hyperparathyroidism of renal origin: Secondary | ICD-10-CM | POA: Diagnosis not present

## 2019-03-22 DIAGNOSIS — E1122 Type 2 diabetes mellitus with diabetic chronic kidney disease: Secondary | ICD-10-CM | POA: Diagnosis not present

## 2019-03-22 DIAGNOSIS — D649 Anemia, unspecified: Secondary | ICD-10-CM | POA: Diagnosis not present

## 2019-03-22 DIAGNOSIS — N186 End stage renal disease: Secondary | ICD-10-CM | POA: Diagnosis not present

## 2019-03-25 ENCOUNTER — Other Ambulatory Visit: Payer: Self-pay | Admitting: *Deleted

## 2019-03-25 DIAGNOSIS — N2581 Secondary hyperparathyroidism of renal origin: Secondary | ICD-10-CM | POA: Diagnosis not present

## 2019-03-25 DIAGNOSIS — E1122 Type 2 diabetes mellitus with diabetic chronic kidney disease: Secondary | ICD-10-CM | POA: Diagnosis not present

## 2019-03-25 DIAGNOSIS — N186 End stage renal disease: Secondary | ICD-10-CM | POA: Diagnosis not present

## 2019-03-25 DIAGNOSIS — D649 Anemia, unspecified: Secondary | ICD-10-CM | POA: Diagnosis not present

## 2019-03-25 NOTE — Patient Outreach (Signed)
[place] [facility]) Care Management  03/25/2019  Raquel Sayres 1956/10/08 [id number]    RN 2nd attempt to reach pt however remains unsuccessful. Will rescheduled another outreach call over the next week for [facility] services. RN able to leave a HIPAA approved voice message requesting a call back. Will further engage at that time on needed services completed a screen and initial assessment if appropriate.   Raina Mina, RN Care Management Coordinator [place] Office [phone]

## 2019-03-27 DIAGNOSIS — N2581 Secondary hyperparathyroidism of renal origin: Secondary | ICD-10-CM | POA: Diagnosis not present

## 2019-03-27 DIAGNOSIS — N186 End stage renal disease: Secondary | ICD-10-CM | POA: Diagnosis not present

## 2019-03-27 DIAGNOSIS — E1122 Type 2 diabetes mellitus with diabetic chronic kidney disease: Secondary | ICD-10-CM | POA: Diagnosis not present

## 2019-03-27 DIAGNOSIS — D649 Anemia, unspecified: Secondary | ICD-10-CM | POA: Diagnosis not present

## 2019-03-28 DIAGNOSIS — Z992 Dependence on renal dialysis: Secondary | ICD-10-CM | POA: Diagnosis not present

## 2019-03-28 DIAGNOSIS — N186 End stage renal disease: Secondary | ICD-10-CM | POA: Diagnosis not present

## 2019-03-28 DIAGNOSIS — I129 Hypertensive chronic kidney disease with stage 1 through stage 4 chronic kidney disease, or unspecified chronic kidney disease: Secondary | ICD-10-CM | POA: Diagnosis not present

## 2019-03-29 DIAGNOSIS — N186 End stage renal disease: Secondary | ICD-10-CM | POA: Diagnosis not present

## 2019-03-29 DIAGNOSIS — N2581 Secondary hyperparathyroidism of renal origin: Secondary | ICD-10-CM | POA: Diagnosis not present

## 2019-03-29 DIAGNOSIS — E1122 Type 2 diabetes mellitus with diabetic chronic kidney disease: Secondary | ICD-10-CM | POA: Diagnosis not present

## 2019-03-29 DIAGNOSIS — D649 Anemia, unspecified: Secondary | ICD-10-CM | POA: Diagnosis not present

## 2019-03-31 ENCOUNTER — Other Ambulatory Visit: Payer: Self-pay | Admitting: *Deleted

## 2019-03-31 ENCOUNTER — Encounter: Payer: Self-pay | Admitting: *Deleted

## 2019-03-31 NOTE — Patient Outreach (Signed)
[facility]) Care Management  03/31/2019  Jared [name] 01/06/1956 [id number]   Nex-Gen (Diabetes) Initial Outreach: 03/31/2019  Telephone Assessment  RN spoke with and introduced the [facility] program. RN verified identifiers and inquired if this is a good time to discuss his ongoing management of care. Pt was screen and RN offered to enroll into the [facility] program to assist pt with managing his diabetes. Pt receptive but not aware of his last read. Epic indicated 7.2 back in Dec. Pt reports weekly dialysis and states they continue to perform A1C but he is not aware of the most recent ready.  Pt states he has a new endocrinologist pending an appointment but aware he needs to reduce his number to around 6. Reports his CBG reads from 150-200 with his most recent this morning at 147. Pt provides a history of having diabetes for over 30 years and continue to manage as best has he can.  Plan of care discuss related to knowledge deficit of diabetes with discussion on dietary habits and lowering his overall A1c and overall diabetes management of care focus on low blood sugars (hypoglycemia). Will send printed material on today's discussion and encouraged adherence to the discuss plan of care. Will generate a care plan with goals and interventions (pt receptive). Will also notify pt's primary provider on pt's enrollment and disposition with [place] services. Pt grateful and remains receptive to the scheduled follow up next month for diabetes management of care,.  [facility] CM Care Plan Problem One     Most Recent Value  Care Plan Problem One  Knowledge Deficit to diabetes control related to elevated A1c-7.2%  Role Documenting the Problem One  Care Management [place] for Problem One  Active  THN Long Term Goal   Pt will report reduction in A1c by 1-2 pts in the next 90 days.  THN Long Term Goal Start Date  03/31/19  Interventions for Problem One Long Term Goal  Educate pt on A1c, normal  range of A1c and personal A1c goal. Will send printed educational material for self management of care.   THN CM Short Term Goal #1   Pt will identify 2 foods items high in protein and low in carbs in the next 30 day.  THN CM Short Term Goal #1 Start Date  03/31/19  Interventions for Short Term Goal #1  Educate pt about food types: simple carbs, proteins and fats.  THN CM Short Term Goal #2   Pt will verbalize 2 ways of managing low blood sugar in the next 30 days.  THN CM Short Term Goal #2 Start Date  03/31/19  Interventions for Short Term Goal #2  Educate pt on low blood sugar. Will provide material on low blood sugar. Will discuss various symptoms and how to recognize symptoms and what to do.       Raina Mina, RN Care Management Coordinator [place] Office [phone]

## 2019-04-01 DIAGNOSIS — E1122 Type 2 diabetes mellitus with diabetic chronic kidney disease: Secondary | ICD-10-CM | POA: Diagnosis not present

## 2019-04-01 DIAGNOSIS — D649 Anemia, unspecified: Secondary | ICD-10-CM | POA: Diagnosis not present

## 2019-04-01 DIAGNOSIS — N186 End stage renal disease: Secondary | ICD-10-CM | POA: Diagnosis not present

## 2019-04-01 DIAGNOSIS — N2581 Secondary hyperparathyroidism of renal origin: Secondary | ICD-10-CM | POA: Diagnosis not present

## 2019-04-02 ENCOUNTER — Other Ambulatory Visit: Payer: Self-pay | Admitting: Family Medicine

## 2019-04-02 NOTE — Telephone Encounter (Signed)
Please have him contact his nephrologist and we need to also send a message that his kidney specialist needs to take over treatment for his HTN since he is having dialysis.

## 2019-04-02 NOTE — Telephone Encounter (Signed)
Is this ok to refill?  

## 2019-04-03 DIAGNOSIS — N2581 Secondary hyperparathyroidism of renal origin: Secondary | ICD-10-CM | POA: Diagnosis not present

## 2019-04-03 DIAGNOSIS — N186 End stage renal disease: Secondary | ICD-10-CM | POA: Diagnosis not present

## 2019-04-03 DIAGNOSIS — E1122 Type 2 diabetes mellitus with diabetic chronic kidney disease: Secondary | ICD-10-CM | POA: Diagnosis not present

## 2019-04-03 DIAGNOSIS — D649 Anemia, unspecified: Secondary | ICD-10-CM | POA: Diagnosis not present

## 2019-04-03 NOTE — Telephone Encounter (Signed)
Called and notified pt to call nephrologist to see if they could take over his HTN med since he is on dialysis.

## 2019-04-03 NOTE — Telephone Encounter (Signed)
Please ask patient if he know who prescribed the Imdur (isosorbide) and how long he has been on this. He must have seen cardiology at some point since he is on this. Let him know this medication is for chest pain mainly. We need to have him follow up with cardiology to determine if he should continue on this medication.

## 2019-04-03 NOTE — Telephone Encounter (Signed)
pt''s sister called back and asked for a referral to be put in. Please put in referral and dx. To [facility] on [address]

## 2019-04-03 NOTE — Telephone Encounter (Signed)
Spoke to pt's sister [name] and pt was going to talk to Dr. Lorrene Reid about this. She thinks he was put on this in hospital and never saw cardiology before. I asked if she wanted a referral to cardiology for him and she said he would just call us back after he got done with Dialysis.

## 2019-04-04 ENCOUNTER — Other Ambulatory Visit: Payer: Self-pay | Admitting: Family Medicine

## 2019-04-04 NOTE — Telephone Encounter (Signed)
This is coming back for a refill again today. Pt's sister requested we go ahead and send pt to cardiology but I do not see referral or dx in there for this. Not sure if this needs to refilled or not

## 2019-04-04 NOTE — Telephone Encounter (Signed)
Per yesterday's refill request. I noted that Sister thought he was put on this in hospital. I will send in for 30 days

## 2019-04-04 NOTE — Telephone Encounter (Signed)
Pt's sister was notified. And I have sent a note to Dr. Lorrene Reid asking for her determination if he needed to stay on imdur or wean off this med

## 2019-04-04 NOTE — Telephone Encounter (Signed)
Ok to refill for 30 days and I will put in referral to cardiology. I am trying to find who started him on this and when. It would be helpful if the sister knows the answer to this question.

## 2019-04-04 NOTE — Telephone Encounter (Signed)
After an in-depth review to determine when he was put on Imdur, this was initiated in 2018 during a hospital stay in efforts to help control his HTN. I would appreciate his nephrologist determining if this medication is still needed for blood pressure control or if he wants to wean him off of this. Please let his sister know and send a message to his nephrologist.

## 2019-04-05 DIAGNOSIS — N186 End stage renal disease: Secondary | ICD-10-CM | POA: Diagnosis not present

## 2019-04-05 DIAGNOSIS — N2581 Secondary hyperparathyroidism of renal origin: Secondary | ICD-10-CM | POA: Diagnosis not present

## 2019-04-05 DIAGNOSIS — D649 Anemia, unspecified: Secondary | ICD-10-CM | POA: Diagnosis not present

## 2019-04-05 DIAGNOSIS — E1122 Type 2 diabetes mellitus with diabetic chronic kidney disease: Secondary | ICD-10-CM | POA: Diagnosis not present

## 2019-04-08 DIAGNOSIS — D649 Anemia, unspecified: Secondary | ICD-10-CM | POA: Diagnosis not present

## 2019-04-08 DIAGNOSIS — N186 End stage renal disease: Secondary | ICD-10-CM | POA: Diagnosis not present

## 2019-04-08 DIAGNOSIS — N2581 Secondary hyperparathyroidism of renal origin: Secondary | ICD-10-CM | POA: Diagnosis not present

## 2019-04-08 DIAGNOSIS — E1122 Type 2 diabetes mellitus with diabetic chronic kidney disease: Secondary | ICD-10-CM | POA: Diagnosis not present

## 2019-04-10 DIAGNOSIS — E1122 Type 2 diabetes mellitus with diabetic chronic kidney disease: Secondary | ICD-10-CM | POA: Diagnosis not present

## 2019-04-10 DIAGNOSIS — N186 End stage renal disease: Secondary | ICD-10-CM | POA: Diagnosis not present

## 2019-04-10 DIAGNOSIS — D649 Anemia, unspecified: Secondary | ICD-10-CM | POA: Diagnosis not present

## 2019-04-10 DIAGNOSIS — N2581 Secondary hyperparathyroidism of renal origin: Secondary | ICD-10-CM | POA: Diagnosis not present

## 2019-04-12 DIAGNOSIS — N2581 Secondary hyperparathyroidism of renal origin: Secondary | ICD-10-CM | POA: Diagnosis not present

## 2019-04-12 DIAGNOSIS — D649 Anemia, unspecified: Secondary | ICD-10-CM | POA: Diagnosis not present

## 2019-04-12 DIAGNOSIS — E1122 Type 2 diabetes mellitus with diabetic chronic kidney disease: Secondary | ICD-10-CM | POA: Diagnosis not present

## 2019-04-12 DIAGNOSIS — N186 End stage renal disease: Secondary | ICD-10-CM | POA: Diagnosis not present

## 2019-04-15 DIAGNOSIS — D649 Anemia, unspecified: Secondary | ICD-10-CM | POA: Diagnosis not present

## 2019-04-15 DIAGNOSIS — N2581 Secondary hyperparathyroidism of renal origin: Secondary | ICD-10-CM | POA: Diagnosis not present

## 2019-04-15 DIAGNOSIS — E1122 Type 2 diabetes mellitus with diabetic chronic kidney disease: Secondary | ICD-10-CM | POA: Diagnosis not present

## 2019-04-15 DIAGNOSIS — N186 End stage renal disease: Secondary | ICD-10-CM | POA: Diagnosis not present

## 2019-04-16 ENCOUNTER — Telehealth: Payer: Self-pay | Admitting: Family Medicine

## 2019-04-16 NOTE — Telephone Encounter (Signed)
Pt informed, needs diabetes check and follow up and he will call back and schedule

## 2019-04-17 DIAGNOSIS — D649 Anemia, unspecified: Secondary | ICD-10-CM | POA: Diagnosis not present

## 2019-04-17 DIAGNOSIS — E1122 Type 2 diabetes mellitus with diabetic chronic kidney disease: Secondary | ICD-10-CM | POA: Diagnosis not present

## 2019-04-17 DIAGNOSIS — N186 End stage renal disease: Secondary | ICD-10-CM | POA: Diagnosis not present

## 2019-04-17 DIAGNOSIS — N2581 Secondary hyperparathyroidism of renal origin: Secondary | ICD-10-CM | POA: Diagnosis not present

## 2019-04-19 DIAGNOSIS — E1122 Type 2 diabetes mellitus with diabetic chronic kidney disease: Secondary | ICD-10-CM | POA: Diagnosis not present

## 2019-04-19 DIAGNOSIS — N186 End stage renal disease: Secondary | ICD-10-CM | POA: Diagnosis not present

## 2019-04-19 DIAGNOSIS — D649 Anemia, unspecified: Secondary | ICD-10-CM | POA: Diagnosis not present

## 2019-04-19 DIAGNOSIS — N2581 Secondary hyperparathyroidism of renal origin: Secondary | ICD-10-CM | POA: Diagnosis not present

## 2019-04-22 DIAGNOSIS — E1122 Type 2 diabetes mellitus with diabetic chronic kidney disease: Secondary | ICD-10-CM | POA: Diagnosis not present

## 2019-04-22 DIAGNOSIS — N2581 Secondary hyperparathyroidism of renal origin: Secondary | ICD-10-CM | POA: Diagnosis not present

## 2019-04-22 DIAGNOSIS — D649 Anemia, unspecified: Secondary | ICD-10-CM | POA: Diagnosis not present

## 2019-04-22 DIAGNOSIS — N186 End stage renal disease: Secondary | ICD-10-CM | POA: Diagnosis not present

## 2019-04-24 DIAGNOSIS — N2581 Secondary hyperparathyroidism of renal origin: Secondary | ICD-10-CM | POA: Diagnosis not present

## 2019-04-24 DIAGNOSIS — D649 Anemia, unspecified: Secondary | ICD-10-CM | POA: Diagnosis not present

## 2019-04-24 DIAGNOSIS — E1122 Type 2 diabetes mellitus with diabetic chronic kidney disease: Secondary | ICD-10-CM | POA: Diagnosis not present

## 2019-04-24 DIAGNOSIS — N186 End stage renal disease: Secondary | ICD-10-CM | POA: Diagnosis not present

## 2019-04-26 DIAGNOSIS — N186 End stage renal disease: Secondary | ICD-10-CM | POA: Diagnosis not present

## 2019-04-26 DIAGNOSIS — E1122 Type 2 diabetes mellitus with diabetic chronic kidney disease: Secondary | ICD-10-CM | POA: Diagnosis not present

## 2019-04-26 DIAGNOSIS — D649 Anemia, unspecified: Secondary | ICD-10-CM | POA: Diagnosis not present

## 2019-04-26 DIAGNOSIS — N2581 Secondary hyperparathyroidism of renal origin: Secondary | ICD-10-CM | POA: Diagnosis not present

## 2019-04-28 DIAGNOSIS — Z992 Dependence on renal dialysis: Secondary | ICD-10-CM | POA: Diagnosis not present

## 2019-04-28 DIAGNOSIS — N186 End stage renal disease: Secondary | ICD-10-CM | POA: Diagnosis not present

## 2019-04-28 DIAGNOSIS — I129 Hypertensive chronic kidney disease with stage 1 through stage 4 chronic kidney disease, or unspecified chronic kidney disease: Secondary | ICD-10-CM | POA: Diagnosis not present

## 2019-04-29 DIAGNOSIS — E1122 Type 2 diabetes mellitus with diabetic chronic kidney disease: Secondary | ICD-10-CM | POA: Diagnosis not present

## 2019-04-29 DIAGNOSIS — D649 Anemia, unspecified: Secondary | ICD-10-CM | POA: Diagnosis not present

## 2019-04-29 DIAGNOSIS — N186 End stage renal disease: Secondary | ICD-10-CM | POA: Diagnosis not present

## 2019-04-29 DIAGNOSIS — N2581 Secondary hyperparathyroidism of renal origin: Secondary | ICD-10-CM | POA: Diagnosis not present

## 2019-04-30 DIAGNOSIS — N185 Chronic kidney disease, stage 5: Secondary | ICD-10-CM | POA: Diagnosis not present

## 2019-04-30 DIAGNOSIS — Z7189 Other specified counseling: Secondary | ICD-10-CM | POA: Diagnosis not present

## 2019-04-30 DIAGNOSIS — E118 Type 2 diabetes mellitus with unspecified complications: Secondary | ICD-10-CM | POA: Diagnosis not present

## 2019-04-30 DIAGNOSIS — Z6822 Body mass index (BMI) 22.0-22.9, adult: Secondary | ICD-10-CM | POA: Diagnosis not present

## 2019-04-30 DIAGNOSIS — E1165 Type 2 diabetes mellitus with hyperglycemia: Secondary | ICD-10-CM | POA: Diagnosis not present

## 2019-05-01 ENCOUNTER — Other Ambulatory Visit: Payer: Self-pay | Admitting: *Deleted

## 2019-05-01 DIAGNOSIS — E1122 Type 2 diabetes mellitus with diabetic chronic kidney disease: Secondary | ICD-10-CM | POA: Diagnosis not present

## 2019-05-01 DIAGNOSIS — N186 End stage renal disease: Secondary | ICD-10-CM | POA: Diagnosis not present

## 2019-05-01 DIAGNOSIS — N2581 Secondary hyperparathyroidism of renal origin: Secondary | ICD-10-CM | POA: Diagnosis not present

## 2019-05-01 DIAGNOSIS — D649 Anemia, unspecified: Secondary | ICD-10-CM | POA: Diagnosis not present

## 2019-05-01 NOTE — Patient Outreach (Signed)
[place] [facility]) Care Management  05/01/2019  Jared [name] 05-May-1956 [id number]   RN outreach attempt today was unsuccessful and unable to leave a voice message. Will reschedule another follow up call over the next week for ongoing case management services.  Raina Mina, RN Care Management Coordinator [place] Office [phone]

## 2019-05-03 DIAGNOSIS — N2581 Secondary hyperparathyroidism of renal origin: Secondary | ICD-10-CM | POA: Diagnosis not present

## 2019-05-03 DIAGNOSIS — E1122 Type 2 diabetes mellitus with diabetic chronic kidney disease: Secondary | ICD-10-CM | POA: Diagnosis not present

## 2019-05-03 DIAGNOSIS — N186 End stage renal disease: Secondary | ICD-10-CM | POA: Diagnosis not present

## 2019-05-03 DIAGNOSIS — D649 Anemia, unspecified: Secondary | ICD-10-CM | POA: Diagnosis not present

## 2019-05-06 DIAGNOSIS — E1122 Type 2 diabetes mellitus with diabetic chronic kidney disease: Secondary | ICD-10-CM | POA: Diagnosis not present

## 2019-05-06 DIAGNOSIS — D649 Anemia, unspecified: Secondary | ICD-10-CM | POA: Diagnosis not present

## 2019-05-06 DIAGNOSIS — N186 End stage renal disease: Secondary | ICD-10-CM | POA: Diagnosis not present

## 2019-05-06 DIAGNOSIS — N2581 Secondary hyperparathyroidism of renal origin: Secondary | ICD-10-CM | POA: Diagnosis not present

## 2019-05-07 ENCOUNTER — Other Ambulatory Visit: Payer: Self-pay | Admitting: *Deleted

## 2019-05-07 NOTE — Patient Outreach (Signed)
[facility] [facility]) Care Management  05/07/2019  Markes Shatswell 1956/01/12 [id number]    RN attempted outreach however unsuccessful. RN able to leave a HIPAA approved voice message requesting a call back.   Will rescheduled another outreach call within the next week.  Raina Mina, RN Care Management Coordinator [place] Office [phone]

## 2019-05-08 DIAGNOSIS — N2581 Secondary hyperparathyroidism of renal origin: Secondary | ICD-10-CM | POA: Diagnosis not present

## 2019-05-08 DIAGNOSIS — E1122 Type 2 diabetes mellitus with diabetic chronic kidney disease: Secondary | ICD-10-CM | POA: Diagnosis not present

## 2019-05-08 DIAGNOSIS — N186 End stage renal disease: Secondary | ICD-10-CM | POA: Diagnosis not present

## 2019-05-08 DIAGNOSIS — D649 Anemia, unspecified: Secondary | ICD-10-CM | POA: Diagnosis not present

## 2019-05-10 DIAGNOSIS — N2581 Secondary hyperparathyroidism of renal origin: Secondary | ICD-10-CM | POA: Diagnosis not present

## 2019-05-10 DIAGNOSIS — D649 Anemia, unspecified: Secondary | ICD-10-CM | POA: Diagnosis not present

## 2019-05-10 DIAGNOSIS — N186 End stage renal disease: Secondary | ICD-10-CM | POA: Diagnosis not present

## 2019-05-10 DIAGNOSIS — E1122 Type 2 diabetes mellitus with diabetic chronic kidney disease: Secondary | ICD-10-CM | POA: Diagnosis not present

## 2019-05-12 ENCOUNTER — Other Ambulatory Visit: Payer: Self-pay | Admitting: *Deleted

## 2019-05-12 NOTE — Patient Outreach (Signed)
[place] [facility]) Care Management  05/12/2019  Jared [name] 03-Aug-1956 [id number]    RN attempted outreach call to pt however able to speak with the pt's sister who lives in the same house. Sister indicated the contact numbers noted in system are for her and her sibling. States she would go and see if pt is available and have the pt call RN case manager back. Case Manager current awaiting on a call back. Will continue to inquire on pt's ongoing management of care related to his diabetes.   Will await a call back and inquired further at that time.  Raina Mina, RN Care Management Coordinator [place] Office [phone]

## 2019-05-13 ENCOUNTER — Encounter: Payer: Self-pay | Admitting: *Deleted

## 2019-05-13 DIAGNOSIS — N2581 Secondary hyperparathyroidism of renal origin: Secondary | ICD-10-CM | POA: Diagnosis not present

## 2019-05-13 DIAGNOSIS — E1122 Type 2 diabetes mellitus with diabetic chronic kidney disease: Secondary | ICD-10-CM | POA: Diagnosis not present

## 2019-05-13 DIAGNOSIS — N186 End stage renal disease: Secondary | ICD-10-CM | POA: Diagnosis not present

## 2019-05-13 DIAGNOSIS — D649 Anemia, unspecified: Secondary | ICD-10-CM | POA: Diagnosis not present

## 2019-05-15 DIAGNOSIS — E1122 Type 2 diabetes mellitus with diabetic chronic kidney disease: Secondary | ICD-10-CM | POA: Diagnosis not present

## 2019-05-15 DIAGNOSIS — D649 Anemia, unspecified: Secondary | ICD-10-CM | POA: Diagnosis not present

## 2019-05-15 DIAGNOSIS — N2581 Secondary hyperparathyroidism of renal origin: Secondary | ICD-10-CM | POA: Diagnosis not present

## 2019-05-15 DIAGNOSIS — N186 End stage renal disease: Secondary | ICD-10-CM | POA: Diagnosis not present

## 2019-05-17 DIAGNOSIS — D649 Anemia, unspecified: Secondary | ICD-10-CM | POA: Diagnosis not present

## 2019-05-17 DIAGNOSIS — N2581 Secondary hyperparathyroidism of renal origin: Secondary | ICD-10-CM | POA: Diagnosis not present

## 2019-05-17 DIAGNOSIS — N186 End stage renal disease: Secondary | ICD-10-CM | POA: Diagnosis not present

## 2019-05-17 DIAGNOSIS — E1122 Type 2 diabetes mellitus with diabetic chronic kidney disease: Secondary | ICD-10-CM | POA: Diagnosis not present

## 2019-05-20 ENCOUNTER — Other Ambulatory Visit: Payer: Self-pay | Admitting: *Deleted

## 2019-05-20 DIAGNOSIS — N2581 Secondary hyperparathyroidism of renal origin: Secondary | ICD-10-CM | POA: Diagnosis not present

## 2019-05-20 DIAGNOSIS — E1122 Type 2 diabetes mellitus with diabetic chronic kidney disease: Secondary | ICD-10-CM | POA: Diagnosis not present

## 2019-05-20 DIAGNOSIS — D649 Anemia, unspecified: Secondary | ICD-10-CM | POA: Diagnosis not present

## 2019-05-20 DIAGNOSIS — N186 End stage renal disease: Secondary | ICD-10-CM | POA: Diagnosis not present

## 2019-05-20 NOTE — Patient Outreach (Signed)
[place] [facility]) Care Management  05/20/2019  Morris Longenecker 09-11-1956 [id number]    RN has attempted several outreach calls however all remain unsuccessful with any call backs. RN has also sent pt an outreach letter with no response. Per police will close this case and update the primary provider of pt's disposition with [facility] services.   Raina Mina, RN Care Management Coordinator [place] Office [phone]

## 2019-05-24 DIAGNOSIS — N186 End stage renal disease: Secondary | ICD-10-CM | POA: Diagnosis not present

## 2019-05-24 DIAGNOSIS — E1122 Type 2 diabetes mellitus with diabetic chronic kidney disease: Secondary | ICD-10-CM | POA: Diagnosis not present

## 2019-05-24 DIAGNOSIS — D649 Anemia, unspecified: Secondary | ICD-10-CM | POA: Diagnosis not present

## 2019-05-24 DIAGNOSIS — N2581 Secondary hyperparathyroidism of renal origin: Secondary | ICD-10-CM | POA: Diagnosis not present

## 2019-05-26 ENCOUNTER — Telehealth: Payer: Self-pay | Admitting: Family Medicine

## 2019-05-26 DIAGNOSIS — C61 Malignant neoplasm of prostate: Secondary | ICD-10-CM | POA: Diagnosis not present

## 2019-05-26 NOTE — Telephone Encounter (Signed)
Dismissal letter in guarantor snapshot  °

## 2019-05-27 ENCOUNTER — Telehealth: Payer: Self-pay | Admitting: Adult Health

## 2019-05-27 DIAGNOSIS — D649 Anemia, unspecified: Secondary | ICD-10-CM | POA: Diagnosis not present

## 2019-05-27 DIAGNOSIS — N186 End stage renal disease: Secondary | ICD-10-CM | POA: Diagnosis not present

## 2019-05-27 DIAGNOSIS — E1122 Type 2 diabetes mellitus with diabetic chronic kidney disease: Secondary | ICD-10-CM | POA: Diagnosis not present

## 2019-05-27 DIAGNOSIS — N2581 Secondary hyperparathyroidism of renal origin: Secondary | ICD-10-CM | POA: Diagnosis not present

## 2019-05-27 NOTE — Telephone Encounter (Signed)
I called patient regarding rescheduling his 7/10 appointment due to our office being closed on Fridays. Requested patient call back to reschedule.

## 2019-05-28 DIAGNOSIS — Z992 Dependence on renal dialysis: Secondary | ICD-10-CM | POA: Diagnosis not present

## 2019-05-28 DIAGNOSIS — N186 End stage renal disease: Secondary | ICD-10-CM | POA: Diagnosis not present

## 2019-05-28 DIAGNOSIS — I129 Hypertensive chronic kidney disease with stage 1 through stage 4 chronic kidney disease, or unspecified chronic kidney disease: Secondary | ICD-10-CM | POA: Diagnosis not present

## 2019-05-29 DIAGNOSIS — N2581 Secondary hyperparathyroidism of renal origin: Secondary | ICD-10-CM | POA: Diagnosis not present

## 2019-05-29 DIAGNOSIS — N186 End stage renal disease: Secondary | ICD-10-CM | POA: Diagnosis not present

## 2019-05-29 DIAGNOSIS — D649 Anemia, unspecified: Secondary | ICD-10-CM | POA: Diagnosis not present

## 2019-05-29 DIAGNOSIS — E1122 Type 2 diabetes mellitus with diabetic chronic kidney disease: Secondary | ICD-10-CM | POA: Diagnosis not present

## 2019-05-31 DIAGNOSIS — E1122 Type 2 diabetes mellitus with diabetic chronic kidney disease: Secondary | ICD-10-CM | POA: Diagnosis not present

## 2019-05-31 DIAGNOSIS — D649 Anemia, unspecified: Secondary | ICD-10-CM | POA: Diagnosis not present

## 2019-05-31 DIAGNOSIS — N2581 Secondary hyperparathyroidism of renal origin: Secondary | ICD-10-CM | POA: Diagnosis not present

## 2019-05-31 DIAGNOSIS — N186 End stage renal disease: Secondary | ICD-10-CM | POA: Diagnosis not present

## 2019-06-03 DIAGNOSIS — N2581 Secondary hyperparathyroidism of renal origin: Secondary | ICD-10-CM | POA: Diagnosis not present

## 2019-06-03 DIAGNOSIS — N186 End stage renal disease: Secondary | ICD-10-CM | POA: Diagnosis not present

## 2019-06-03 DIAGNOSIS — E1122 Type 2 diabetes mellitus with diabetic chronic kidney disease: Secondary | ICD-10-CM | POA: Diagnosis not present

## 2019-06-03 DIAGNOSIS — D649 Anemia, unspecified: Secondary | ICD-10-CM | POA: Diagnosis not present

## 2019-06-05 DIAGNOSIS — E1122 Type 2 diabetes mellitus with diabetic chronic kidney disease: Secondary | ICD-10-CM | POA: Diagnosis not present

## 2019-06-05 DIAGNOSIS — R310 Gross hematuria: Secondary | ICD-10-CM | POA: Diagnosis not present

## 2019-06-05 DIAGNOSIS — N2581 Secondary hyperparathyroidism of renal origin: Secondary | ICD-10-CM | POA: Diagnosis not present

## 2019-06-05 DIAGNOSIS — C61 Malignant neoplasm of prostate: Secondary | ICD-10-CM | POA: Diagnosis not present

## 2019-06-05 DIAGNOSIS — N186 End stage renal disease: Secondary | ICD-10-CM | POA: Diagnosis not present

## 2019-06-05 DIAGNOSIS — D649 Anemia, unspecified: Secondary | ICD-10-CM | POA: Diagnosis not present

## 2019-06-05 DIAGNOSIS — C775 Secondary and unspecified malignant neoplasm of intrapelvic lymph nodes: Secondary | ICD-10-CM | POA: Diagnosis not present

## 2019-06-06 ENCOUNTER — Ambulatory Visit: Payer: Medicare Other | Admitting: Adult Health

## 2019-06-07 DIAGNOSIS — D649 Anemia, unspecified: Secondary | ICD-10-CM | POA: Diagnosis not present

## 2019-06-07 DIAGNOSIS — N2581 Secondary hyperparathyroidism of renal origin: Secondary | ICD-10-CM | POA: Diagnosis not present

## 2019-06-07 DIAGNOSIS — N186 End stage renal disease: Secondary | ICD-10-CM | POA: Diagnosis not present

## 2019-06-07 DIAGNOSIS — E1122 Type 2 diabetes mellitus with diabetic chronic kidney disease: Secondary | ICD-10-CM | POA: Diagnosis not present

## 2019-06-10 ENCOUNTER — Other Ambulatory Visit (HOSPITAL_COMMUNITY): Payer: Self-pay | Admitting: Urology

## 2019-06-10 DIAGNOSIS — C61 Malignant neoplasm of prostate: Secondary | ICD-10-CM

## 2019-06-10 DIAGNOSIS — N186 End stage renal disease: Secondary | ICD-10-CM | POA: Diagnosis not present

## 2019-06-10 DIAGNOSIS — N2581 Secondary hyperparathyroidism of renal origin: Secondary | ICD-10-CM | POA: Diagnosis not present

## 2019-06-10 DIAGNOSIS — E1122 Type 2 diabetes mellitus with diabetic chronic kidney disease: Secondary | ICD-10-CM | POA: Diagnosis not present

## 2019-06-10 DIAGNOSIS — D649 Anemia, unspecified: Secondary | ICD-10-CM | POA: Diagnosis not present

## 2019-06-12 DIAGNOSIS — N2581 Secondary hyperparathyroidism of renal origin: Secondary | ICD-10-CM | POA: Diagnosis not present

## 2019-06-12 DIAGNOSIS — E1122 Type 2 diabetes mellitus with diabetic chronic kidney disease: Secondary | ICD-10-CM | POA: Diagnosis not present

## 2019-06-12 DIAGNOSIS — N186 End stage renal disease: Secondary | ICD-10-CM | POA: Diagnosis not present

## 2019-06-12 DIAGNOSIS — D649 Anemia, unspecified: Secondary | ICD-10-CM | POA: Diagnosis not present

## 2019-06-13 IMAGING — DX DG CHEST 1V PORT
1 series · 1 of 1 positions shown · non-contrast
Comparison: 04/30/2018 [DATE] a.m..

CLINICAL DATA: 62-year-old male with central line placement.
Subsequent encounter.

EXAM:
PORTABLE CHEST 1 VIEW

[chest]
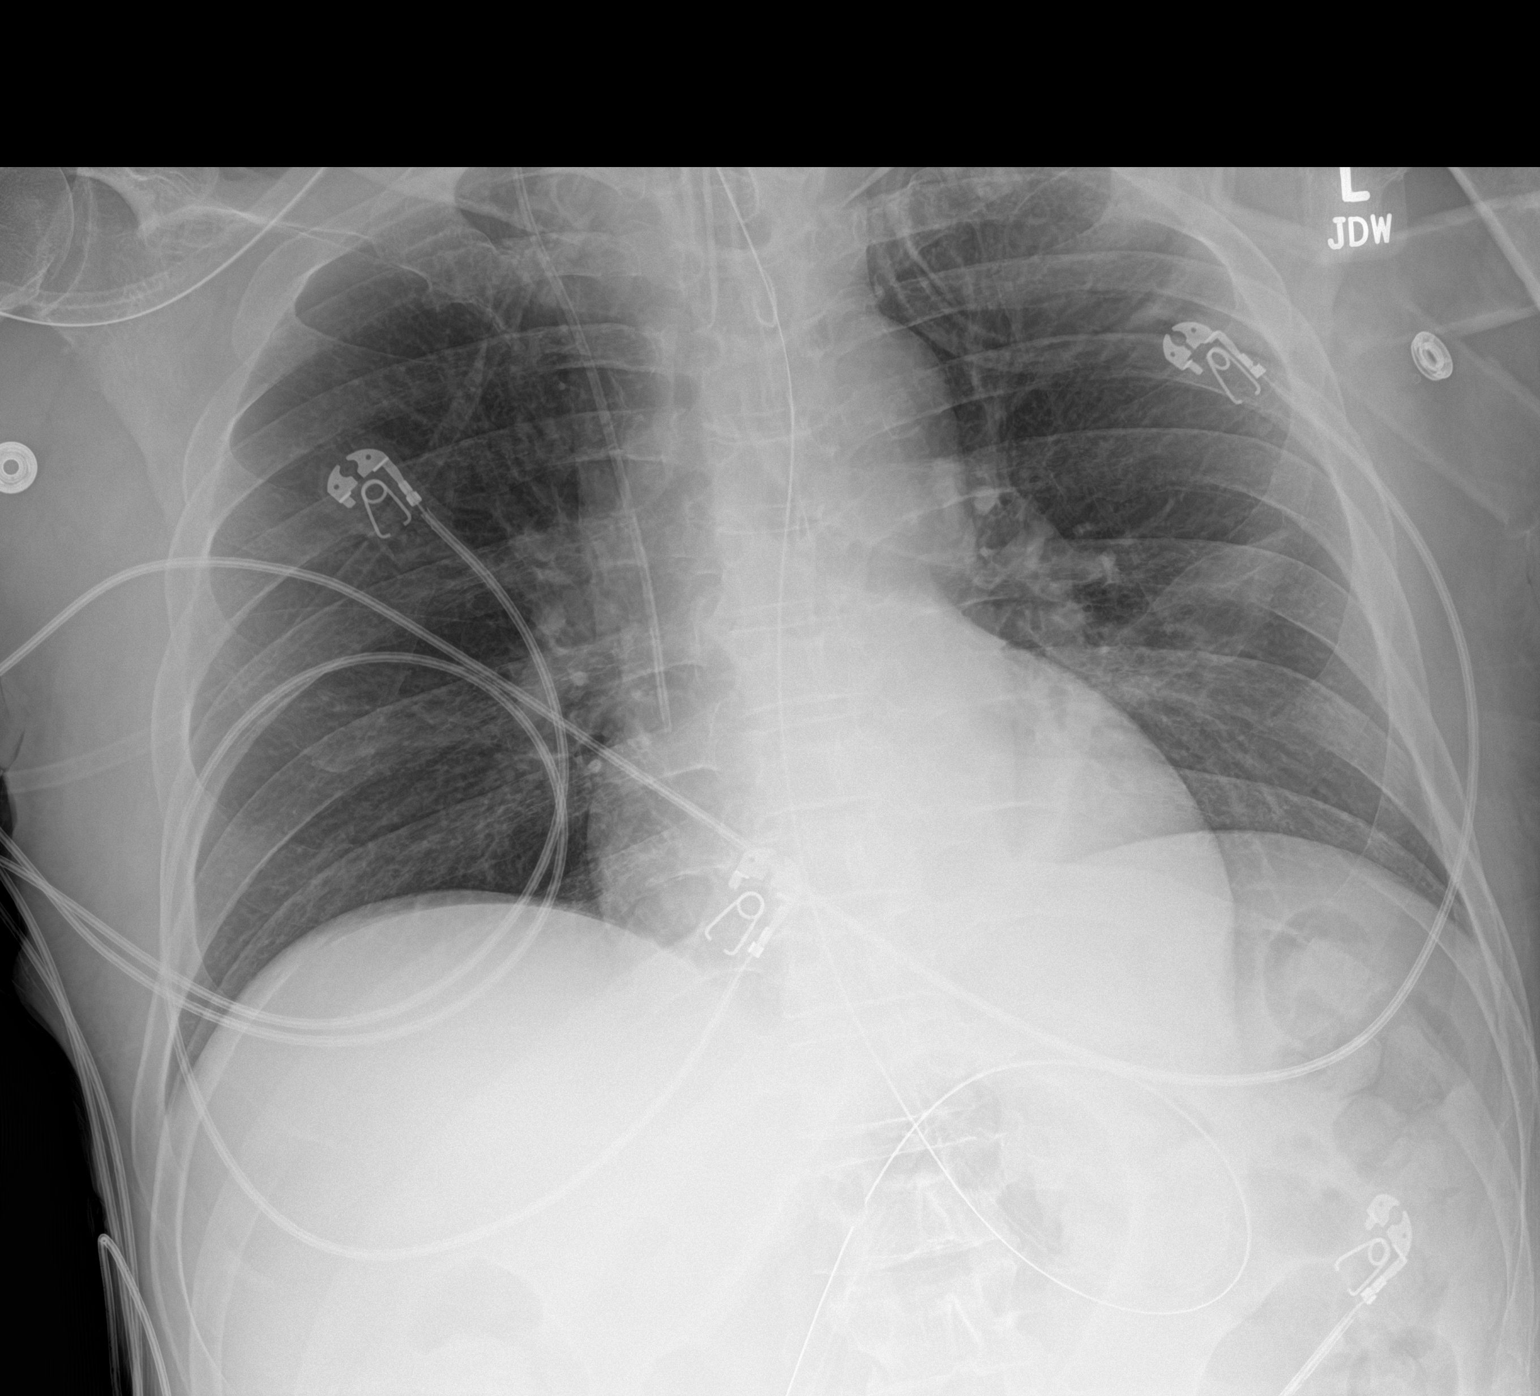

[1 of 1 positions shown; findings below may reference images not displayed]

FINDINGS: Endotracheal tube tip 4.3 cm above the carina.

Right internal jugular catheter placed with tip cavoatrial junction
level.

Nasogastric tube curled within the stomach tip not imaged on present
exam.

Bandlike atelectasis left infrahilar region.

No infiltrate, congestive heart failure or pneumothorax.

Heart size within normal limits.
IMPRESSION: Right central line tip caval atrial junction level. No pneumothorax.

Bandlike atelectasis left infrahilar region.

Interval improved aeration left lung base.

## 2019-06-13 IMAGING — DX DG CHEST 1V PORT
1 series · 1 of 1 positions shown · non-contrast
Comparison: 04/29/2018.

CLINICAL DATA: Acute respiratory failure.

EXAM:
PORTABLE CHEST 1 VIEW

[chest ap]
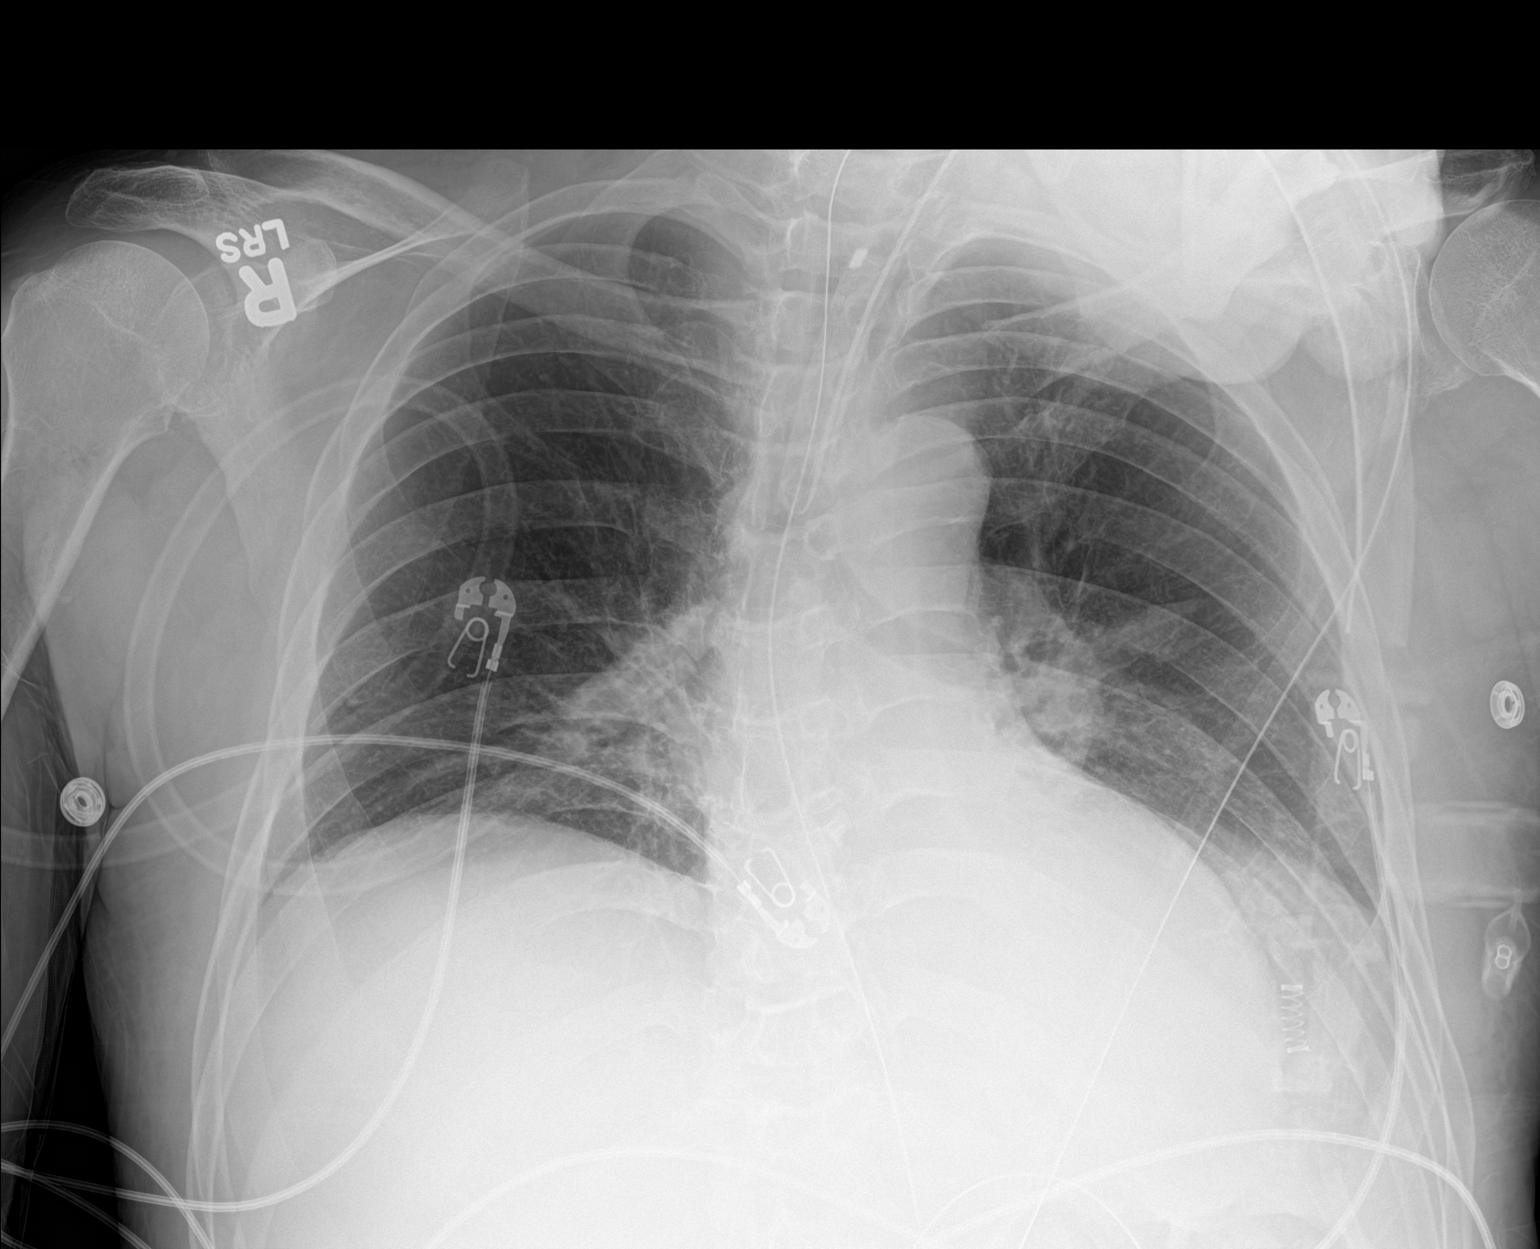

[1 of 1 positions shown; findings below may reference images not displayed]

FINDINGS: Low lung volumes. LEFT ventricular hypertrophy. Perihilar opacities
appear similar without significant improvement. ET tube 2 cm above
carina, should be pulled back approximately 2 cm. Nasogastric tube
in the stomach.
IMPRESSION: Stable perihilar densities.  ETT too low, 2 cm above.

## 2019-06-14 DIAGNOSIS — D649 Anemia, unspecified: Secondary | ICD-10-CM | POA: Diagnosis not present

## 2019-06-14 DIAGNOSIS — E1122 Type 2 diabetes mellitus with diabetic chronic kidney disease: Secondary | ICD-10-CM | POA: Diagnosis not present

## 2019-06-14 DIAGNOSIS — N2581 Secondary hyperparathyroidism of renal origin: Secondary | ICD-10-CM | POA: Diagnosis not present

## 2019-06-14 DIAGNOSIS — N186 End stage renal disease: Secondary | ICD-10-CM | POA: Diagnosis not present

## 2019-06-14 IMAGING — DX DG CHEST 1V PORT
1 series · 1 of 1 positions shown · non-contrast
Comparison: April 30, 2018 common April 29, 2018, and March 14, 2017

CLINICAL DATA: Hypoxia

EXAM:
PORTABLE CHEST 1 VIEW

[chest]
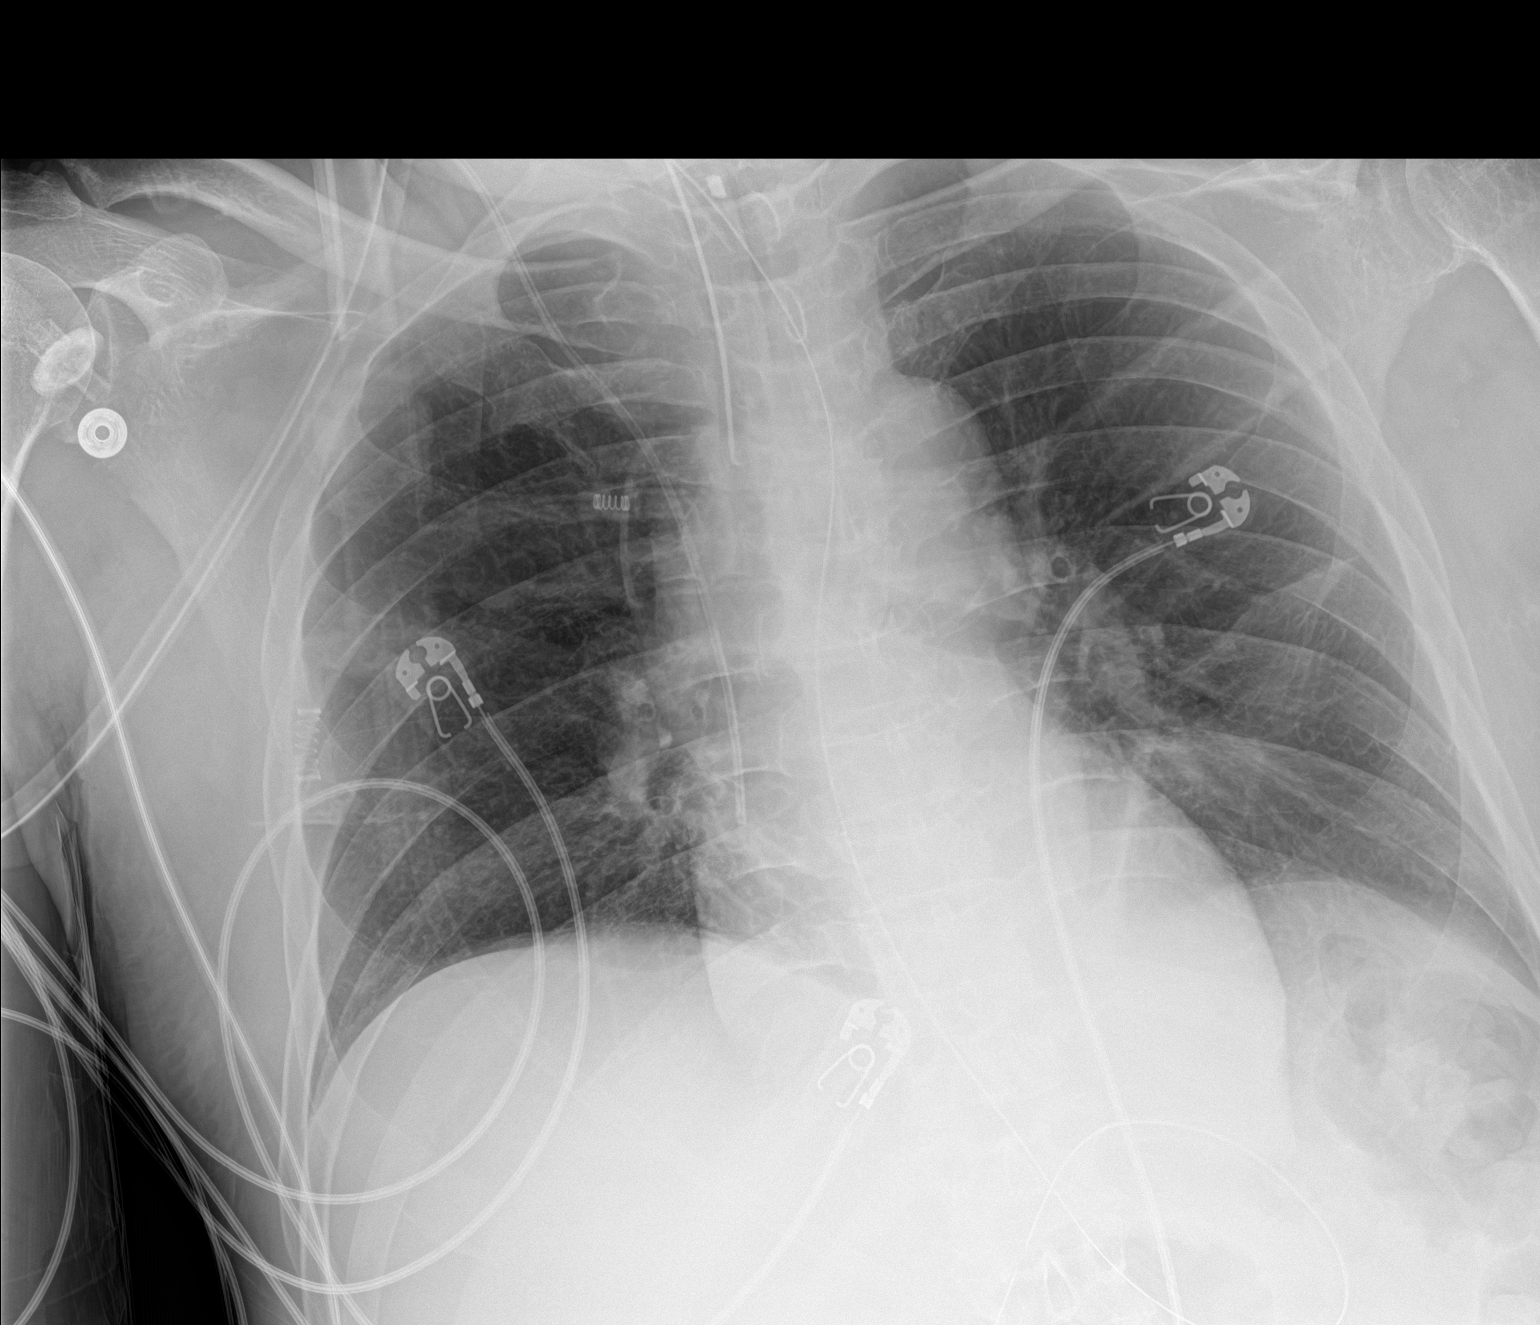

[1 of 1 positions shown; findings below may reference images not displayed]

FINDINGS: Endotracheal tube tip is 3.0 cm above the carina. Central catheter
tip is at the cavoatrial junction. Nasogastric tube tip and side
port are in the stomach. No pneumothorax. There is persistent
atelectatic change in the left lower lung zone. Lungs elsewhere are
clear. Heart size and pulmonary vascularity are normal. No
adenopathy. No bone lesions.
IMPRESSION: Persistent left lower lobe atelectatic change. A small focus of
superimposed pneumonia cannot be entirely excluded in this area.
Lungs elsewhere are clear. Heart size normal. Tube and catheter
positions as described without pneumothorax.

## 2019-06-16 DIAGNOSIS — E1165 Type 2 diabetes mellitus with hyperglycemia: Secondary | ICD-10-CM | POA: Diagnosis not present

## 2019-06-16 DIAGNOSIS — Z7189 Other specified counseling: Secondary | ICD-10-CM | POA: Diagnosis not present

## 2019-06-16 DIAGNOSIS — Z6822 Body mass index (BMI) 22.0-22.9, adult: Secondary | ICD-10-CM | POA: Diagnosis not present

## 2019-06-16 DIAGNOSIS — N185 Chronic kidney disease, stage 5: Secondary | ICD-10-CM | POA: Diagnosis not present

## 2019-06-16 DIAGNOSIS — E118 Type 2 diabetes mellitus with unspecified complications: Secondary | ICD-10-CM | POA: Diagnosis not present

## 2019-06-17 DIAGNOSIS — D649 Anemia, unspecified: Secondary | ICD-10-CM | POA: Diagnosis not present

## 2019-06-17 DIAGNOSIS — N186 End stage renal disease: Secondary | ICD-10-CM | POA: Diagnosis not present

## 2019-06-17 DIAGNOSIS — E1122 Type 2 diabetes mellitus with diabetic chronic kidney disease: Secondary | ICD-10-CM | POA: Diagnosis not present

## 2019-06-17 DIAGNOSIS — N2581 Secondary hyperparathyroidism of renal origin: Secondary | ICD-10-CM | POA: Diagnosis not present

## 2019-06-18 ENCOUNTER — Encounter (HOSPITAL_COMMUNITY)
Admission: RE | Admit: 2019-06-18 | Discharge: 2019-06-18 | Disposition: A | Discharge status: Home / Self Care | Payer: Medicare Other | Source: Ambulatory Visit | Attending: Urology | Admitting: Urology

## 2019-06-18 ENCOUNTER — Other Ambulatory Visit: Payer: Self-pay

## 2019-06-18 DIAGNOSIS — C61 Malignant neoplasm of prostate: Secondary | ICD-10-CM | POA: Insufficient documentation

## 2019-06-18 MED ORDER — TECHNETIUM TC 99M MEDRONATE IV KIT
20.0000 | PACK | Freq: Once | INTRAVENOUS | Status: AC | PRN
  Administered 2019-06-18: 22 via INTRAVENOUS

## 2019-06-19 DIAGNOSIS — D649 Anemia, unspecified: Secondary | ICD-10-CM | POA: Diagnosis not present

## 2019-06-19 DIAGNOSIS — E1122 Type 2 diabetes mellitus with diabetic chronic kidney disease: Secondary | ICD-10-CM | POA: Diagnosis not present

## 2019-06-19 DIAGNOSIS — N2581 Secondary hyperparathyroidism of renal origin: Secondary | ICD-10-CM | POA: Diagnosis not present

## 2019-06-19 DIAGNOSIS — N186 End stage renal disease: Secondary | ICD-10-CM | POA: Diagnosis not present

## 2019-06-21 DIAGNOSIS — E1122 Type 2 diabetes mellitus with diabetic chronic kidney disease: Secondary | ICD-10-CM | POA: Diagnosis not present

## 2019-06-21 DIAGNOSIS — N2581 Secondary hyperparathyroidism of renal origin: Secondary | ICD-10-CM | POA: Diagnosis not present

## 2019-06-21 DIAGNOSIS — N186 End stage renal disease: Secondary | ICD-10-CM | POA: Diagnosis not present

## 2019-06-21 DIAGNOSIS — D649 Anemia, unspecified: Secondary | ICD-10-CM | POA: Diagnosis not present

## 2019-06-24 DIAGNOSIS — D649 Anemia, unspecified: Secondary | ICD-10-CM | POA: Diagnosis not present

## 2019-06-24 DIAGNOSIS — N2581 Secondary hyperparathyroidism of renal origin: Secondary | ICD-10-CM | POA: Diagnosis not present

## 2019-06-24 DIAGNOSIS — E1122 Type 2 diabetes mellitus with diabetic chronic kidney disease: Secondary | ICD-10-CM | POA: Diagnosis not present

## 2019-06-24 DIAGNOSIS — N186 End stage renal disease: Secondary | ICD-10-CM | POA: Diagnosis not present

## 2019-06-26 DIAGNOSIS — E1122 Type 2 diabetes mellitus with diabetic chronic kidney disease: Secondary | ICD-10-CM | POA: Diagnosis not present

## 2019-06-26 DIAGNOSIS — N186 End stage renal disease: Secondary | ICD-10-CM | POA: Diagnosis not present

## 2019-06-26 DIAGNOSIS — D649 Anemia, unspecified: Secondary | ICD-10-CM | POA: Diagnosis not present

## 2019-06-26 DIAGNOSIS — N2581 Secondary hyperparathyroidism of renal origin: Secondary | ICD-10-CM | POA: Diagnosis not present

## 2019-06-28 DIAGNOSIS — N2581 Secondary hyperparathyroidism of renal origin: Secondary | ICD-10-CM | POA: Diagnosis not present

## 2019-06-28 DIAGNOSIS — I129 Hypertensive chronic kidney disease with stage 1 through stage 4 chronic kidney disease, or unspecified chronic kidney disease: Secondary | ICD-10-CM | POA: Diagnosis not present

## 2019-06-28 DIAGNOSIS — D631 Anemia in chronic kidney disease: Secondary | ICD-10-CM | POA: Diagnosis not present

## 2019-06-28 DIAGNOSIS — N186 End stage renal disease: Secondary | ICD-10-CM | POA: Diagnosis not present

## 2019-06-28 DIAGNOSIS — Z992 Dependence on renal dialysis: Secondary | ICD-10-CM | POA: Diagnosis not present

## 2019-06-28 DIAGNOSIS — E1122 Type 2 diabetes mellitus with diabetic chronic kidney disease: Secondary | ICD-10-CM | POA: Diagnosis not present

## 2019-07-01 DIAGNOSIS — Z992 Dependence on renal dialysis: Secondary | ICD-10-CM | POA: Diagnosis not present

## 2019-07-01 DIAGNOSIS — N186 End stage renal disease: Secondary | ICD-10-CM | POA: Diagnosis not present

## 2019-07-01 DIAGNOSIS — E1122 Type 2 diabetes mellitus with diabetic chronic kidney disease: Secondary | ICD-10-CM | POA: Diagnosis not present

## 2019-07-01 DIAGNOSIS — D631 Anemia in chronic kidney disease: Secondary | ICD-10-CM | POA: Diagnosis not present

## 2019-07-01 DIAGNOSIS — N2581 Secondary hyperparathyroidism of renal origin: Secondary | ICD-10-CM | POA: Diagnosis not present

## 2019-07-03 DIAGNOSIS — C775 Secondary and unspecified malignant neoplasm of intrapelvic lymph nodes: Secondary | ICD-10-CM | POA: Diagnosis not present

## 2019-07-03 DIAGNOSIS — Z992 Dependence on renal dialysis: Secondary | ICD-10-CM | POA: Diagnosis not present

## 2019-07-03 DIAGNOSIS — N186 End stage renal disease: Secondary | ICD-10-CM | POA: Diagnosis not present

## 2019-07-03 DIAGNOSIS — D631 Anemia in chronic kidney disease: Secondary | ICD-10-CM | POA: Diagnosis not present

## 2019-07-03 DIAGNOSIS — E1122 Type 2 diabetes mellitus with diabetic chronic kidney disease: Secondary | ICD-10-CM | POA: Diagnosis not present

## 2019-07-03 DIAGNOSIS — C61 Malignant neoplasm of prostate: Secondary | ICD-10-CM | POA: Diagnosis not present

## 2019-07-03 DIAGNOSIS — N2581 Secondary hyperparathyroidism of renal origin: Secondary | ICD-10-CM | POA: Diagnosis not present

## 2019-07-03 DIAGNOSIS — R310 Gross hematuria: Secondary | ICD-10-CM | POA: Diagnosis not present

## 2019-07-05 DIAGNOSIS — Z992 Dependence on renal dialysis: Secondary | ICD-10-CM | POA: Diagnosis not present

## 2019-07-05 DIAGNOSIS — E1122 Type 2 diabetes mellitus with diabetic chronic kidney disease: Secondary | ICD-10-CM | POA: Diagnosis not present

## 2019-07-05 DIAGNOSIS — D631 Anemia in chronic kidney disease: Secondary | ICD-10-CM | POA: Diagnosis not present

## 2019-07-05 DIAGNOSIS — N2581 Secondary hyperparathyroidism of renal origin: Secondary | ICD-10-CM | POA: Diagnosis not present

## 2019-07-05 DIAGNOSIS — N186 End stage renal disease: Secondary | ICD-10-CM | POA: Diagnosis not present

## 2019-07-08 DIAGNOSIS — N2581 Secondary hyperparathyroidism of renal origin: Secondary | ICD-10-CM | POA: Diagnosis not present

## 2019-07-08 DIAGNOSIS — E1122 Type 2 diabetes mellitus with diabetic chronic kidney disease: Secondary | ICD-10-CM | POA: Diagnosis not present

## 2019-07-08 DIAGNOSIS — N186 End stage renal disease: Secondary | ICD-10-CM | POA: Diagnosis not present

## 2019-07-08 DIAGNOSIS — Z992 Dependence on renal dialysis: Secondary | ICD-10-CM | POA: Diagnosis not present

## 2019-07-08 DIAGNOSIS — D631 Anemia in chronic kidney disease: Secondary | ICD-10-CM | POA: Diagnosis not present

## 2019-07-10 DIAGNOSIS — N186 End stage renal disease: Secondary | ICD-10-CM | POA: Diagnosis not present

## 2019-07-10 DIAGNOSIS — Z992 Dependence on renal dialysis: Secondary | ICD-10-CM | POA: Diagnosis not present

## 2019-07-10 DIAGNOSIS — D631 Anemia in chronic kidney disease: Secondary | ICD-10-CM | POA: Diagnosis not present

## 2019-07-10 DIAGNOSIS — E1122 Type 2 diabetes mellitus with diabetic chronic kidney disease: Secondary | ICD-10-CM | POA: Diagnosis not present

## 2019-07-10 DIAGNOSIS — N2581 Secondary hyperparathyroidism of renal origin: Secondary | ICD-10-CM | POA: Diagnosis not present

## 2019-07-12 DIAGNOSIS — E1122 Type 2 diabetes mellitus with diabetic chronic kidney disease: Secondary | ICD-10-CM | POA: Diagnosis not present

## 2019-07-12 DIAGNOSIS — Z992 Dependence on renal dialysis: Secondary | ICD-10-CM | POA: Diagnosis not present

## 2019-07-12 DIAGNOSIS — D631 Anemia in chronic kidney disease: Secondary | ICD-10-CM | POA: Diagnosis not present

## 2019-07-12 DIAGNOSIS — N2581 Secondary hyperparathyroidism of renal origin: Secondary | ICD-10-CM | POA: Diagnosis not present

## 2019-07-12 DIAGNOSIS — N186 End stage renal disease: Secondary | ICD-10-CM | POA: Diagnosis not present

## 2019-07-15 DIAGNOSIS — N186 End stage renal disease: Secondary | ICD-10-CM | POA: Diagnosis not present

## 2019-07-15 DIAGNOSIS — Z992 Dependence on renal dialysis: Secondary | ICD-10-CM | POA: Diagnosis not present

## 2019-07-15 DIAGNOSIS — E1122 Type 2 diabetes mellitus with diabetic chronic kidney disease: Secondary | ICD-10-CM | POA: Diagnosis not present

## 2019-07-15 DIAGNOSIS — N2581 Secondary hyperparathyroidism of renal origin: Secondary | ICD-10-CM | POA: Diagnosis not present

## 2019-07-15 DIAGNOSIS — D631 Anemia in chronic kidney disease: Secondary | ICD-10-CM | POA: Diagnosis not present

## 2019-07-17 DIAGNOSIS — D631 Anemia in chronic kidney disease: Secondary | ICD-10-CM | POA: Diagnosis not present

## 2019-07-17 DIAGNOSIS — Z992 Dependence on renal dialysis: Secondary | ICD-10-CM | POA: Diagnosis not present

## 2019-07-17 DIAGNOSIS — N186 End stage renal disease: Secondary | ICD-10-CM | POA: Diagnosis not present

## 2019-07-17 DIAGNOSIS — N2581 Secondary hyperparathyroidism of renal origin: Secondary | ICD-10-CM | POA: Diagnosis not present

## 2019-07-17 DIAGNOSIS — E1122 Type 2 diabetes mellitus with diabetic chronic kidney disease: Secondary | ICD-10-CM | POA: Diagnosis not present

## 2019-07-19 DIAGNOSIS — Z992 Dependence on renal dialysis: Secondary | ICD-10-CM | POA: Diagnosis not present

## 2019-07-19 DIAGNOSIS — N2581 Secondary hyperparathyroidism of renal origin: Secondary | ICD-10-CM | POA: Diagnosis not present

## 2019-07-19 DIAGNOSIS — D631 Anemia in chronic kidney disease: Secondary | ICD-10-CM | POA: Diagnosis not present

## 2019-07-19 DIAGNOSIS — N186 End stage renal disease: Secondary | ICD-10-CM | POA: Diagnosis not present

## 2019-07-19 DIAGNOSIS — E1122 Type 2 diabetes mellitus with diabetic chronic kidney disease: Secondary | ICD-10-CM | POA: Diagnosis not present

## 2019-07-22 DIAGNOSIS — D631 Anemia in chronic kidney disease: Secondary | ICD-10-CM | POA: Diagnosis not present

## 2019-07-22 DIAGNOSIS — N186 End stage renal disease: Secondary | ICD-10-CM | POA: Diagnosis not present

## 2019-07-22 DIAGNOSIS — E1122 Type 2 diabetes mellitus with diabetic chronic kidney disease: Secondary | ICD-10-CM | POA: Diagnosis not present

## 2019-07-22 DIAGNOSIS — Z992 Dependence on renal dialysis: Secondary | ICD-10-CM | POA: Diagnosis not present

## 2019-07-22 DIAGNOSIS — N2581 Secondary hyperparathyroidism of renal origin: Secondary | ICD-10-CM | POA: Diagnosis not present

## 2019-07-24 DIAGNOSIS — Z992 Dependence on renal dialysis: Secondary | ICD-10-CM | POA: Diagnosis not present

## 2019-07-24 DIAGNOSIS — N186 End stage renal disease: Secondary | ICD-10-CM | POA: Diagnosis not present

## 2019-07-24 DIAGNOSIS — D631 Anemia in chronic kidney disease: Secondary | ICD-10-CM | POA: Diagnosis not present

## 2019-07-24 DIAGNOSIS — N2581 Secondary hyperparathyroidism of renal origin: Secondary | ICD-10-CM | POA: Diagnosis not present

## 2019-07-24 DIAGNOSIS — E1122 Type 2 diabetes mellitus with diabetic chronic kidney disease: Secondary | ICD-10-CM | POA: Diagnosis not present

## 2019-07-26 DIAGNOSIS — E1122 Type 2 diabetes mellitus with diabetic chronic kidney disease: Secondary | ICD-10-CM | POA: Diagnosis not present

## 2019-07-26 DIAGNOSIS — N2581 Secondary hyperparathyroidism of renal origin: Secondary | ICD-10-CM | POA: Diagnosis not present

## 2019-07-26 DIAGNOSIS — D631 Anemia in chronic kidney disease: Secondary | ICD-10-CM | POA: Diagnosis not present

## 2019-07-26 DIAGNOSIS — N186 End stage renal disease: Secondary | ICD-10-CM | POA: Diagnosis not present

## 2019-07-26 DIAGNOSIS — Z992 Dependence on renal dialysis: Secondary | ICD-10-CM | POA: Diagnosis not present

## 2019-07-29 DIAGNOSIS — I129 Hypertensive chronic kidney disease with stage 1 through stage 4 chronic kidney disease, or unspecified chronic kidney disease: Secondary | ICD-10-CM | POA: Diagnosis not present

## 2019-07-29 DIAGNOSIS — E1122 Type 2 diabetes mellitus with diabetic chronic kidney disease: Secondary | ICD-10-CM | POA: Diagnosis not present

## 2019-07-29 DIAGNOSIS — N186 End stage renal disease: Secondary | ICD-10-CM | POA: Diagnosis not present

## 2019-07-29 DIAGNOSIS — Z992 Dependence on renal dialysis: Secondary | ICD-10-CM | POA: Diagnosis not present

## 2019-07-29 DIAGNOSIS — Z23 Encounter for immunization: Secondary | ICD-10-CM | POA: Diagnosis not present

## 2019-07-29 DIAGNOSIS — N2581 Secondary hyperparathyroidism of renal origin: Secondary | ICD-10-CM | POA: Diagnosis not present

## 2019-07-29 DIAGNOSIS — D631 Anemia in chronic kidney disease: Secondary | ICD-10-CM | POA: Diagnosis not present

## 2019-07-31 DIAGNOSIS — N186 End stage renal disease: Secondary | ICD-10-CM | POA: Diagnosis not present

## 2019-07-31 DIAGNOSIS — N2581 Secondary hyperparathyroidism of renal origin: Secondary | ICD-10-CM | POA: Diagnosis not present

## 2019-07-31 DIAGNOSIS — E1122 Type 2 diabetes mellitus with diabetic chronic kidney disease: Secondary | ICD-10-CM | POA: Diagnosis not present

## 2019-07-31 DIAGNOSIS — D631 Anemia in chronic kidney disease: Secondary | ICD-10-CM | POA: Diagnosis not present

## 2019-07-31 DIAGNOSIS — Z23 Encounter for immunization: Secondary | ICD-10-CM | POA: Diagnosis not present

## 2019-07-31 DIAGNOSIS — Z992 Dependence on renal dialysis: Secondary | ICD-10-CM | POA: Diagnosis not present

## 2019-08-02 DIAGNOSIS — Z23 Encounter for immunization: Secondary | ICD-10-CM | POA: Diagnosis not present

## 2019-08-02 DIAGNOSIS — N2581 Secondary hyperparathyroidism of renal origin: Secondary | ICD-10-CM | POA: Diagnosis not present

## 2019-08-02 DIAGNOSIS — Z992 Dependence on renal dialysis: Secondary | ICD-10-CM | POA: Diagnosis not present

## 2019-08-02 DIAGNOSIS — E1122 Type 2 diabetes mellitus with diabetic chronic kidney disease: Secondary | ICD-10-CM | POA: Diagnosis not present

## 2019-08-02 DIAGNOSIS — N186 End stage renal disease: Secondary | ICD-10-CM | POA: Diagnosis not present

## 2019-08-02 DIAGNOSIS — D631 Anemia in chronic kidney disease: Secondary | ICD-10-CM | POA: Diagnosis not present

## 2019-08-05 DIAGNOSIS — D631 Anemia in chronic kidney disease: Secondary | ICD-10-CM | POA: Diagnosis not present

## 2019-08-05 DIAGNOSIS — N2581 Secondary hyperparathyroidism of renal origin: Secondary | ICD-10-CM | POA: Diagnosis not present

## 2019-08-05 DIAGNOSIS — E1122 Type 2 diabetes mellitus with diabetic chronic kidney disease: Secondary | ICD-10-CM | POA: Diagnosis not present

## 2019-08-05 DIAGNOSIS — Z992 Dependence on renal dialysis: Secondary | ICD-10-CM | POA: Diagnosis not present

## 2019-08-05 DIAGNOSIS — Z23 Encounter for immunization: Secondary | ICD-10-CM | POA: Diagnosis not present

## 2019-08-05 DIAGNOSIS — N186 End stage renal disease: Secondary | ICD-10-CM | POA: Diagnosis not present

## 2019-08-07 DIAGNOSIS — N2581 Secondary hyperparathyroidism of renal origin: Secondary | ICD-10-CM | POA: Diagnosis not present

## 2019-08-07 DIAGNOSIS — E1122 Type 2 diabetes mellitus with diabetic chronic kidney disease: Secondary | ICD-10-CM | POA: Diagnosis not present

## 2019-08-07 DIAGNOSIS — D631 Anemia in chronic kidney disease: Secondary | ICD-10-CM | POA: Diagnosis not present

## 2019-08-07 DIAGNOSIS — N186 End stage renal disease: Secondary | ICD-10-CM | POA: Diagnosis not present

## 2019-08-07 DIAGNOSIS — Z992 Dependence on renal dialysis: Secondary | ICD-10-CM | POA: Diagnosis not present

## 2019-08-07 DIAGNOSIS — Z23 Encounter for immunization: Secondary | ICD-10-CM | POA: Diagnosis not present

## 2019-08-09 DIAGNOSIS — E1122 Type 2 diabetes mellitus with diabetic chronic kidney disease: Secondary | ICD-10-CM | POA: Diagnosis not present

## 2019-08-09 DIAGNOSIS — N2581 Secondary hyperparathyroidism of renal origin: Secondary | ICD-10-CM | POA: Diagnosis not present

## 2019-08-09 DIAGNOSIS — D631 Anemia in chronic kidney disease: Secondary | ICD-10-CM | POA: Diagnosis not present

## 2019-08-09 DIAGNOSIS — Z23 Encounter for immunization: Secondary | ICD-10-CM | POA: Diagnosis not present

## 2019-08-09 DIAGNOSIS — N186 End stage renal disease: Secondary | ICD-10-CM | POA: Diagnosis not present

## 2019-08-09 DIAGNOSIS — Z992 Dependence on renal dialysis: Secondary | ICD-10-CM | POA: Diagnosis not present

## 2019-08-12 DIAGNOSIS — Z992 Dependence on renal dialysis: Secondary | ICD-10-CM | POA: Diagnosis not present

## 2019-08-12 DIAGNOSIS — N186 End stage renal disease: Secondary | ICD-10-CM | POA: Diagnosis not present

## 2019-08-12 DIAGNOSIS — N2581 Secondary hyperparathyroidism of renal origin: Secondary | ICD-10-CM | POA: Diagnosis not present

## 2019-08-12 DIAGNOSIS — D631 Anemia in chronic kidney disease: Secondary | ICD-10-CM | POA: Diagnosis not present

## 2019-08-12 DIAGNOSIS — Z23 Encounter for immunization: Secondary | ICD-10-CM | POA: Diagnosis not present

## 2019-08-12 DIAGNOSIS — E1122 Type 2 diabetes mellitus with diabetic chronic kidney disease: Secondary | ICD-10-CM | POA: Diagnosis not present

## 2019-08-14 DIAGNOSIS — N186 End stage renal disease: Secondary | ICD-10-CM | POA: Diagnosis not present

## 2019-08-14 DIAGNOSIS — Z992 Dependence on renal dialysis: Secondary | ICD-10-CM | POA: Diagnosis not present

## 2019-08-14 DIAGNOSIS — N2581 Secondary hyperparathyroidism of renal origin: Secondary | ICD-10-CM | POA: Diagnosis not present

## 2019-08-14 DIAGNOSIS — E1122 Type 2 diabetes mellitus with diabetic chronic kidney disease: Secondary | ICD-10-CM | POA: Diagnosis not present

## 2019-08-14 DIAGNOSIS — Z23 Encounter for immunization: Secondary | ICD-10-CM | POA: Diagnosis not present

## 2019-08-14 DIAGNOSIS — D631 Anemia in chronic kidney disease: Secondary | ICD-10-CM | POA: Diagnosis not present

## 2019-08-16 DIAGNOSIS — N186 End stage renal disease: Secondary | ICD-10-CM | POA: Diagnosis not present

## 2019-08-16 DIAGNOSIS — E1122 Type 2 diabetes mellitus with diabetic chronic kidney disease: Secondary | ICD-10-CM | POA: Diagnosis not present

## 2019-08-16 DIAGNOSIS — D631 Anemia in chronic kidney disease: Secondary | ICD-10-CM | POA: Diagnosis not present

## 2019-08-16 DIAGNOSIS — N2581 Secondary hyperparathyroidism of renal origin: Secondary | ICD-10-CM | POA: Diagnosis not present

## 2019-08-16 DIAGNOSIS — Z23 Encounter for immunization: Secondary | ICD-10-CM | POA: Diagnosis not present

## 2019-08-16 DIAGNOSIS — Z992 Dependence on renal dialysis: Secondary | ICD-10-CM | POA: Diagnosis not present

## 2019-08-19 DIAGNOSIS — E1122 Type 2 diabetes mellitus with diabetic chronic kidney disease: Secondary | ICD-10-CM | POA: Diagnosis not present

## 2019-08-19 DIAGNOSIS — Z23 Encounter for immunization: Secondary | ICD-10-CM | POA: Diagnosis not present

## 2019-08-19 DIAGNOSIS — D631 Anemia in chronic kidney disease: Secondary | ICD-10-CM | POA: Diagnosis not present

## 2019-08-19 DIAGNOSIS — N186 End stage renal disease: Secondary | ICD-10-CM | POA: Diagnosis not present

## 2019-08-19 DIAGNOSIS — N2581 Secondary hyperparathyroidism of renal origin: Secondary | ICD-10-CM | POA: Diagnosis not present

## 2019-08-19 DIAGNOSIS — Z992 Dependence on renal dialysis: Secondary | ICD-10-CM | POA: Diagnosis not present

## 2019-08-21 DIAGNOSIS — D631 Anemia in chronic kidney disease: Secondary | ICD-10-CM | POA: Diagnosis not present

## 2019-08-21 DIAGNOSIS — Z992 Dependence on renal dialysis: Secondary | ICD-10-CM | POA: Diagnosis not present

## 2019-08-21 DIAGNOSIS — E1122 Type 2 diabetes mellitus with diabetic chronic kidney disease: Secondary | ICD-10-CM | POA: Diagnosis not present

## 2019-08-21 DIAGNOSIS — N186 End stage renal disease: Secondary | ICD-10-CM | POA: Diagnosis not present

## 2019-08-21 DIAGNOSIS — N2581 Secondary hyperparathyroidism of renal origin: Secondary | ICD-10-CM | POA: Diagnosis not present

## 2019-08-21 DIAGNOSIS — Z23 Encounter for immunization: Secondary | ICD-10-CM | POA: Diagnosis not present

## 2019-08-23 DIAGNOSIS — Z23 Encounter for immunization: Secondary | ICD-10-CM | POA: Diagnosis not present

## 2019-08-23 DIAGNOSIS — N2581 Secondary hyperparathyroidism of renal origin: Secondary | ICD-10-CM | POA: Diagnosis not present

## 2019-08-23 DIAGNOSIS — D631 Anemia in chronic kidney disease: Secondary | ICD-10-CM | POA: Diagnosis not present

## 2019-08-23 DIAGNOSIS — N186 End stage renal disease: Secondary | ICD-10-CM | POA: Diagnosis not present

## 2019-08-23 DIAGNOSIS — E1122 Type 2 diabetes mellitus with diabetic chronic kidney disease: Secondary | ICD-10-CM | POA: Diagnosis not present

## 2019-08-23 DIAGNOSIS — Z992 Dependence on renal dialysis: Secondary | ICD-10-CM | POA: Diagnosis not present

## 2019-08-26 DIAGNOSIS — N2581 Secondary hyperparathyroidism of renal origin: Secondary | ICD-10-CM | POA: Diagnosis not present

## 2019-08-26 DIAGNOSIS — Z23 Encounter for immunization: Secondary | ICD-10-CM | POA: Diagnosis not present

## 2019-08-26 DIAGNOSIS — E1122 Type 2 diabetes mellitus with diabetic chronic kidney disease: Secondary | ICD-10-CM | POA: Diagnosis not present

## 2019-08-26 DIAGNOSIS — D631 Anemia in chronic kidney disease: Secondary | ICD-10-CM | POA: Diagnosis not present

## 2019-08-26 DIAGNOSIS — N186 End stage renal disease: Secondary | ICD-10-CM | POA: Diagnosis not present

## 2019-08-26 DIAGNOSIS — Z992 Dependence on renal dialysis: Secondary | ICD-10-CM | POA: Diagnosis not present

## 2019-08-26 NOTE — Progress Notes (Signed)
[name] [address] [place]. [place] [address] [phone]       OFFICE FOLLOW UP VISIT NOTE  [name] Date of Birth:  04-05-1956 Medical Record Number:  [id number]   Referring MD:  [name]  Reason for Referral:  Seizure  Chief Complaint  Patient presents with   Follow-up    6 mon f/u. Alone. Rm 9. Patient would like to discuss whether or not he should continue to take his Keppra.      HPI: Initial Consult : Mr Jared [name] is a 65 year African-American male who had a episode of witnessed seizure in the hospital when he was admitted recently.Jared [name] is an 63 y.o. male who presents to the ED via EMS after sudden onset of unresponsiveness at home.   His first symptom was severe headache, unknown if lateralized. Symptoms progressed to include right sided tingling, RUE jerking, leaning to the right and aphasia. He then, per family, leaned forward in his chair without complete loss of postural tone and started "jerking all over". At the time of EMS arrival, the jerking had stopped. On arrival to the ED, he began seizing. Seizure was generalized, with eyes deviated to the right and right > left jerking movements. Seizure was aborted with 2 mg IV Ativan.  CBG was 700 on arrival. Severely hypertensive and tachycardic as well.  Brief Neurological exam performed after Ativan administration and prior to intubation (premedicated with Etomidate 20mg  IV andRocuronium 80 mg IV, followed by propofol gtt at 10 mcg/kg/min)  Bu neurohospitalist Dr [name] revealed decreased tone in all 4 extremities with slightly more movement on the right relative to the left with noxious stimuli. Pupils were pinpoint and unreactive. Neck was supple. Sonorous breathing. Unarousable with sternal rub. Eyes were closed but remained partially open after eyelids manually raised. No doll's eye reflex was present. Reflexes were hypoactive x 4 with mute plantars.  Patient did not  abuse alcohol or benzodiazepines per family. EEG was abnormal but showed only mild generalized nonspecific slowing without definite epileptiform activity. MRI scan the brain did not show any acute infarct or other pathology. Patient was started on Keppra and is seizure was felt to be symptomatic from significantly elevated blood sugar of 700. Patient was initially started on Versed drip which was tapered. Patient was advised to taper his Keppra over a week which she has done. He has had no further recurrent seizures. 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 Patient was asked not to drive till he saw me but still has been doing some limited driving without any events. He has no prior history of seizures ,significant head injury with loss of consciousness. He denies drinking significant amounts of alcohol or doing any street drugs. There is no family history of epilepsy. Update 12/04/2017 Ward Givens, NP ) He returns today after hospitalization.  The patient was recently hospitalized for metabolic encephalopathy.  He presented to the emergency room with hypertension and blood sugar greater than 1000.  The patient is also end-stage renal disease.  The patient had a questionable seizure event in the hospital.  He was evaluated by the hospital neurologist who recommended restarting Keppra 250 mg twice a day with the plan of eventually tapering off this medication in the future.  It was felt that the patient's seizure event was caused by encephalopathy r/t to hypertension and hyperglycemia.  The patient reports no additional seizure activity.  He has actually only  been taking Keppra 250 mg daily.  He returns today for an evaluation.  Update 02/05/2018 Dr Jared [name]: He returns for follow-up after last visit with Jared [name] nurse practitioner 2 months ago. He continues to do well without any recurrent seizure episodes. He has reduced the dose of Keppra to  250 twice daily. He has a left groin hernia and plans to undergo surgery on 02/19/18. Patient has no new complaints today.  Update 12/05/2018: Patient is being seen today for 39-month follow-up.  It was recommended to start tapering Keppra as normal brain MRI imaging, EEG and evidence of reversible causes.  On 04/29/2018, patient was admitted for breakthrough seizures and severe metabolic encephalopathy in the setting of severe hyperglycemia, severe sepsis with aspiration pneumonia, and malignant hypertension.  EEG abnormal due to moderate diffuse suppression and background slowing which can be seen with hypoxic/ischemic injury, toxic/metabolic encephalopathy, neurodegenerative disorders or medication effect.  No evidence of electrographic seizures and absence of epileptic discharges.  Keppra restarted at that time for seizure prevention.  He states since this time he has been doing well without any recurrent seizure activity.  He continues on Keppra 250 mg twice daily without reported side effects.  He continues to monitor his glucose levels approximately 8-10 times per day as he is fearful of having another episode of hyperglycemia.  He states his glucose levels have been averaging around 200 which he is happy with.  He continues to follow with his PCP for DM management.  No further concerns at this time.  Update 08/26/2019: [name] is being seen today for seizure follow-up.  He has continued on Keppra 250 mg twice daily without any recurrent seizure activity and tolerating well.  He is questioning need of continuation of Keppra as seizures are likely due to severe metabolic encephalopathy (see above note for additional information).  Glucose levels have been stable and typically range 150-200 and continues to follow closely with DM provider for ongoing management.  No further concerns at this time.    ROS:   14 system review of systems is positive for seizures and all other systems negative   PMH:   Past Medical History:  Diagnosis Date   Anemia    Cancer ([place])    early prostate cancer per patient   Chronic kidney disease    on dialysis M-W-F   Diabetes mellitus without complication ([place])    onset as adult   GERD (gastroesophageal reflux disease)    Hypertension    Seizures ([place])    pt states he thinks this is bc of his blood sugar   Stroke ([place])    patient denies   Thyroid disease    hyperparathyroidism    Social History:  Social History   Socioeconomic History   Marital status: Divorced    Spouse name: Not on file   Number of children: Not on file   Years of education: Not on file   Highest education level: Not on file  Occupational History   Not on file  Social Needs   Financial resource strain: Not on file   Food insecurity    Worry: Not on file    Inability: Not on file   Transportation needs    Medical: Not on file    Non-medical: Not on file  Tobacco Use   Smoking status: Former Smoker    Quit date: 05/11/1984    Years since quitting: 35.3   Smokeless tobacco: Never Used  Substance and Sexual Activity  Alcohol use: Yes    Alcohol/week: 1.0 standard drinks    Types: 1 Glasses of wine per week    Comment: occasional   Drug use: No   Sexual activity: Not on file  Lifestyle   Physical activity    Days per week: Not on file    Minutes per session: Not on file   Stress: Not on file  Relationships   Social connections    Talks on phone: Not on file    Gets together: Not on file    Attends religious service: Not on file    Active member of club or organization: Not on file    Attends meetings of clubs or organizations: Not on file    Relationship status: Not on file   Intimate partner violence    Fear of current or ex partner: Not on file    Emotionally abused: Not on file    Physically abused: Not on file    Forced sexual activity: Not on file  Other Topics Concern   Not on file  Social History Narrative   Not  on file    Medications:   Current Outpatient Medications on File Prior to Visit  Medication Sig Dispense Refill   amLODipine (NORVASC) 10 MG tablet Take 1 tablet (10 mg total) by mouth daily. 30 tablet 0   atorvastatin (LIPITOR) 20 MG tablet TAKE 1 TABLET BY MOUTH ONCE DAILY 90 tablet 0   b complex-vitamin c-folic acid (NEPHRO-VITE) 0.8 MG TABS tablet TAKE 1 TABLET BY MOUTH EVERY DAY (ON DIALYSIS DAYS, TAKE AFTER DIALYSIS TREATMENT)  4   cinacalcet (SENSIPAR) 30 MG tablet Take 30 mg by mouth daily.  3   cloNIDine (CATAPRES) 0.3 MG tablet Take 0.3 mg by mouth 2 (two) times daily. as directed  5   hydrALAZINE (APRESOLINE) 100 MG tablet Take 1 tablet (100 mg total) by mouth 3 (three) times daily. 90 tablet 0   insulin NPH Human (NOVOLIN N) 100 UNIT/ML injection Inject 20 units every morning (Patient taking differently: Inject 30 Units into the skin daily before breakfast. ) 10 mL 5   isosorbide mononitrate (IMDUR) 30 MG 24 hr tablet Take 1 tablet by mouth once daily 30 tablet 0   levETIRAcetam (KEPPRA) 250 MG tablet Take 1 tablet (250 mg total) by mouth 2 (two) times daily. 180 tablet 2   metoprolol tartrate (LOPRESSOR) 100 MG tablet Take 1 tablet (100 mg total) by mouth 2 (two) times daily. 60 tablet 1   ondansetron (ZOFRAN ODT) 4 MG disintegrating tablet Take 1 tablet (4 mg total) by mouth every 8 (eight) hours as needed for nausea or vomiting. 20 tablet 0   pantoprazole (PROTONIX) 40 MG tablet TAKE 1 TABLET BY MOUTH ONCE DAILY AT 6AM (Patient taking differently: TAKE 1 TABLET BY MOUTH twice DAILy) 90 tablet 1   promethazine-dextromethorphan (PROMETHAZINE-DM) 6.25-15 MG/5ML syrup TAKE 5 MLS BY MOUTH 4 TIMES DAILY AS NEEDED  0   sevelamer carbonate (RENVELA) 800 MG tablet Take 3 tablets (2,400 mg total) by mouth 3 (three) times daily with meals. 240 tablet 1   No current facility-administered medications on file prior to visit.     Allergies:   Allergies  Allergen Reactions    Penicillins Other (See Comments)    Tolerates cephalosporins  UNSPECIFIED REACTION  Has patient had a PCN reaction causing immediate rash, facial/tongue/throat swelling, SOB or lightheadedness with hypotension: Unk Has patient had a PCN reaction causing severe rash involving mucus membranes or skin necrosis:  Unk Has patient had a PCN reaction that required hospitalization: Unk Has patient had a PCN reaction occurring within the last 10 years: Unk If all of the above answers are "NO", then may proceed with Cephalosporin use.     Physical Exam  Today's Vitals   08/27/19 0831  BP: 122/64  Pulse: (!) 58  Temp: (!) 97.3 F (36.3 C)  TempSrc: Oral  Weight: 152 lb 12.8 oz (69.3 kg)  Height: 5\' 7"  (1.702 m)   Body mass index is 23.93 kg/m.   General: Frail pleasant middle-aged African-American male, seated, in no evident distress Head: head normocephalic and atraumatic.   Neck: supple with no carotid or supraclavicular bruits Cardiovascular: regular rate and rhythm, no murmurs Musculoskeletal: no deformity Skin:  no rash/petichiae Vascular:  Normal pulses all extremities  Neurologic Exam Mental Status: Awake and fully alert. Oriented to place and time. Recent and remote memory intact. Attention span, concentration and fund of knowledge appropriate. Mood and affect appropriate.   Cranial Nerves: Pupils equal, briskly reactive to light. Extraocular movements full without nystagmus. Visual fields full to confrontation. Hearing intact. Facial sensation intact. Face, tongue, palate moves normally and symmetrically.  Motor: Normal bulk and tone. Normal strength in all tested extremity muscles. Sensory.: intact to touch , pinprick , position and vibratory sensation.  Coordination: Rapid alternating movements normal in all extremities. Finger-to-nose and heel-to-shin performed accurately bilaterally. Gait and Station: Arises from chair without difficulty. Stance is normal. Gait demonstrates  normal stride length and balance . Able to heel, toe and tandem walk without difficulty.  Reflexes: 1+ and symmetric. Toes downgoing.      ASSESSMENT: Jared [name] is a 63 year old male with history of seizure provoked by hyperglycemia in 10/2016 and unfortunately had a recurrence of additional seizure episode provoked by hyperglycemia in 04/2018.  He is being seen today for follow-up visit without any additional seizure activity and satisfactory glucose levels.  He is questioning discontinuing Keppra as glucose levels have been stable.    PLAN: Continue Keppra 250 mg twice daily at this time for seizure prevention Repeat EEG as prior EEG abnormal during the severe metabolic encephalopathy.  If normal EEG, will initiate tapering to discontinue Keppra.  Discussion with patient regarding possible recurrent seizure activity with discontinuing or tapering Keppra.  Patient verbalized understanding would like to proceed.  Advised to continue to follow with PCP for DM management.    Follow-up in 4 months or call earlier if needed   Greater than 50% time during this 25 minute  visit was spent on counseling and coordination of care about seizures, provoking triggers, potential recurrent seizures with discontinuing Keppra and indication for repeat EEG and answering all questions to patient satisfaction  Frann Rider, AGNP-BC  [facility] [address] [place], [place] [address]  Phone [phone] Fax [phone] Note: This document was prepared with digital dictation and possible smart phrase technology. Any transcriptional errors that result from this process are unintentional.

## 2019-08-27 ENCOUNTER — Ambulatory Visit (INDEPENDENT_AMBULATORY_CARE_PROVIDER_SITE_OTHER): Payer: Medicare Other | Admitting: Adult Health

## 2019-08-27 ENCOUNTER — Other Ambulatory Visit: Payer: Self-pay

## 2019-08-27 ENCOUNTER — Encounter: Payer: Self-pay | Admitting: Adult Health

## 2019-08-27 VITALS — BP 122/64 | HR 58 | Temp 97.3°F | Ht 67.0 in | Wt 152.8 lb

## 2019-08-27 DIAGNOSIS — E1142 Type 2 diabetes mellitus with diabetic polyneuropathy: Secondary | ICD-10-CM

## 2019-08-27 DIAGNOSIS — R569 Unspecified convulsions: Secondary | ICD-10-CM

## 2019-08-27 NOTE — Patient Instructions (Addendum)
Your Plan:  Continue current dose of Keppra 250mg  twice daily  We will repeat EEG and if normal, we can consider coming off from keppra    Follow up in 4 months or call earlier if needed     Thank you for coming to see [place] at [facility]. I hope we have been able to provide you high quality care today.  You may receive a patient satisfaction survey over the next few weeks. We would appreciate your feedback and comments so that we may continue to improve ourselves and the health of our patients.

## 2019-08-27 NOTE — Progress Notes (Signed)
I agree with the above plan 

## 2019-08-28 DIAGNOSIS — Z992 Dependence on renal dialysis: Secondary | ICD-10-CM | POA: Diagnosis not present

## 2019-08-28 DIAGNOSIS — E1122 Type 2 diabetes mellitus with diabetic chronic kidney disease: Secondary | ICD-10-CM | POA: Diagnosis not present

## 2019-08-28 DIAGNOSIS — N186 End stage renal disease: Secondary | ICD-10-CM | POA: Diagnosis not present

## 2019-08-28 DIAGNOSIS — N2581 Secondary hyperparathyroidism of renal origin: Secondary | ICD-10-CM | POA: Diagnosis not present

## 2019-08-28 DIAGNOSIS — D631 Anemia in chronic kidney disease: Secondary | ICD-10-CM | POA: Diagnosis not present

## 2019-08-28 DIAGNOSIS — I129 Hypertensive chronic kidney disease with stage 1 through stage 4 chronic kidney disease, or unspecified chronic kidney disease: Secondary | ICD-10-CM | POA: Diagnosis not present

## 2019-08-30 DIAGNOSIS — D631 Anemia in chronic kidney disease: Secondary | ICD-10-CM | POA: Diagnosis not present

## 2019-08-30 DIAGNOSIS — Z992 Dependence on renal dialysis: Secondary | ICD-10-CM | POA: Diagnosis not present

## 2019-08-30 DIAGNOSIS — E1122 Type 2 diabetes mellitus with diabetic chronic kidney disease: Secondary | ICD-10-CM | POA: Diagnosis not present

## 2019-08-30 DIAGNOSIS — N2581 Secondary hyperparathyroidism of renal origin: Secondary | ICD-10-CM | POA: Diagnosis not present

## 2019-08-30 DIAGNOSIS — N186 End stage renal disease: Secondary | ICD-10-CM | POA: Diagnosis not present

## 2019-09-02 ENCOUNTER — Other Ambulatory Visit: Payer: Self-pay | Admitting: Adult Health

## 2019-09-02 DIAGNOSIS — N2581 Secondary hyperparathyroidism of renal origin: Secondary | ICD-10-CM | POA: Diagnosis not present

## 2019-09-02 DIAGNOSIS — Z992 Dependence on renal dialysis: Secondary | ICD-10-CM | POA: Diagnosis not present

## 2019-09-02 DIAGNOSIS — D631 Anemia in chronic kidney disease: Secondary | ICD-10-CM | POA: Diagnosis not present

## 2019-09-02 DIAGNOSIS — E1122 Type 2 diabetes mellitus with diabetic chronic kidney disease: Secondary | ICD-10-CM | POA: Diagnosis not present

## 2019-09-02 DIAGNOSIS — N186 End stage renal disease: Secondary | ICD-10-CM | POA: Diagnosis not present

## 2019-09-04 DIAGNOSIS — N186 End stage renal disease: Secondary | ICD-10-CM | POA: Diagnosis not present

## 2019-09-04 DIAGNOSIS — Z992 Dependence on renal dialysis: Secondary | ICD-10-CM | POA: Diagnosis not present

## 2019-09-04 DIAGNOSIS — E1122 Type 2 diabetes mellitus with diabetic chronic kidney disease: Secondary | ICD-10-CM | POA: Diagnosis not present

## 2019-09-04 DIAGNOSIS — D631 Anemia in chronic kidney disease: Secondary | ICD-10-CM | POA: Diagnosis not present

## 2019-09-04 DIAGNOSIS — N2581 Secondary hyperparathyroidism of renal origin: Secondary | ICD-10-CM | POA: Diagnosis not present

## 2019-09-05 DIAGNOSIS — N186 End stage renal disease: Secondary | ICD-10-CM | POA: Diagnosis not present

## 2019-09-05 DIAGNOSIS — Z992 Dependence on renal dialysis: Secondary | ICD-10-CM | POA: Diagnosis not present

## 2019-09-05 DIAGNOSIS — I871 Compression of vein: Secondary | ICD-10-CM | POA: Diagnosis not present

## 2019-09-05 DIAGNOSIS — T82858A Stenosis of vascular prosthetic devices, implants and grafts, initial encounter: Secondary | ICD-10-CM | POA: Diagnosis not present

## 2019-09-06 DIAGNOSIS — N186 End stage renal disease: Secondary | ICD-10-CM | POA: Diagnosis not present

## 2019-09-06 DIAGNOSIS — E1122 Type 2 diabetes mellitus with diabetic chronic kidney disease: Secondary | ICD-10-CM | POA: Diagnosis not present

## 2019-09-06 DIAGNOSIS — N2581 Secondary hyperparathyroidism of renal origin: Secondary | ICD-10-CM | POA: Diagnosis not present

## 2019-09-06 DIAGNOSIS — D631 Anemia in chronic kidney disease: Secondary | ICD-10-CM | POA: Diagnosis not present

## 2019-09-06 DIAGNOSIS — Z992 Dependence on renal dialysis: Secondary | ICD-10-CM | POA: Diagnosis not present

## 2019-09-09 DIAGNOSIS — E1122 Type 2 diabetes mellitus with diabetic chronic kidney disease: Secondary | ICD-10-CM | POA: Diagnosis not present

## 2019-09-09 DIAGNOSIS — N2581 Secondary hyperparathyroidism of renal origin: Secondary | ICD-10-CM | POA: Diagnosis not present

## 2019-09-09 DIAGNOSIS — D631 Anemia in chronic kidney disease: Secondary | ICD-10-CM | POA: Diagnosis not present

## 2019-09-09 DIAGNOSIS — Z992 Dependence on renal dialysis: Secondary | ICD-10-CM | POA: Diagnosis not present

## 2019-09-09 DIAGNOSIS — N186 End stage renal disease: Secondary | ICD-10-CM | POA: Diagnosis not present

## 2019-09-11 DIAGNOSIS — D631 Anemia in chronic kidney disease: Secondary | ICD-10-CM | POA: Diagnosis not present

## 2019-09-11 DIAGNOSIS — Z992 Dependence on renal dialysis: Secondary | ICD-10-CM | POA: Diagnosis not present

## 2019-09-11 DIAGNOSIS — E1122 Type 2 diabetes mellitus with diabetic chronic kidney disease: Secondary | ICD-10-CM | POA: Diagnosis not present

## 2019-09-11 DIAGNOSIS — N2581 Secondary hyperparathyroidism of renal origin: Secondary | ICD-10-CM | POA: Diagnosis not present

## 2019-09-11 DIAGNOSIS — N186 End stage renal disease: Secondary | ICD-10-CM | POA: Diagnosis not present

## 2019-09-13 DIAGNOSIS — Z992 Dependence on renal dialysis: Secondary | ICD-10-CM | POA: Diagnosis not present

## 2019-09-13 DIAGNOSIS — N2581 Secondary hyperparathyroidism of renal origin: Secondary | ICD-10-CM | POA: Diagnosis not present

## 2019-09-13 DIAGNOSIS — N186 End stage renal disease: Secondary | ICD-10-CM | POA: Diagnosis not present

## 2019-09-13 DIAGNOSIS — E1122 Type 2 diabetes mellitus with diabetic chronic kidney disease: Secondary | ICD-10-CM | POA: Diagnosis not present

## 2019-09-13 DIAGNOSIS — D631 Anemia in chronic kidney disease: Secondary | ICD-10-CM | POA: Diagnosis not present

## 2019-09-15 ENCOUNTER — Other Ambulatory Visit: Payer: Medicare Other

## 2019-09-15 ENCOUNTER — Encounter: Payer: Self-pay | Admitting: Adult Health

## 2019-09-16 DIAGNOSIS — Z992 Dependence on renal dialysis: Secondary | ICD-10-CM | POA: Diagnosis not present

## 2019-09-16 DIAGNOSIS — D631 Anemia in chronic kidney disease: Secondary | ICD-10-CM | POA: Diagnosis not present

## 2019-09-16 DIAGNOSIS — E1122 Type 2 diabetes mellitus with diabetic chronic kidney disease: Secondary | ICD-10-CM | POA: Diagnosis not present

## 2019-09-16 DIAGNOSIS — N186 End stage renal disease: Secondary | ICD-10-CM | POA: Diagnosis not present

## 2019-09-16 DIAGNOSIS — N2581 Secondary hyperparathyroidism of renal origin: Secondary | ICD-10-CM | POA: Diagnosis not present

## 2019-09-18 DIAGNOSIS — N186 End stage renal disease: Secondary | ICD-10-CM | POA: Diagnosis not present

## 2019-09-18 DIAGNOSIS — N2581 Secondary hyperparathyroidism of renal origin: Secondary | ICD-10-CM | POA: Diagnosis not present

## 2019-09-18 DIAGNOSIS — E1122 Type 2 diabetes mellitus with diabetic chronic kidney disease: Secondary | ICD-10-CM | POA: Diagnosis not present

## 2019-09-18 DIAGNOSIS — Z992 Dependence on renal dialysis: Secondary | ICD-10-CM | POA: Diagnosis not present

## 2019-09-18 DIAGNOSIS — D631 Anemia in chronic kidney disease: Secondary | ICD-10-CM | POA: Diagnosis not present

## 2019-09-20 DIAGNOSIS — N2581 Secondary hyperparathyroidism of renal origin: Secondary | ICD-10-CM | POA: Diagnosis not present

## 2019-09-20 DIAGNOSIS — Z992 Dependence on renal dialysis: Secondary | ICD-10-CM | POA: Diagnosis not present

## 2019-09-20 DIAGNOSIS — N186 End stage renal disease: Secondary | ICD-10-CM | POA: Diagnosis not present

## 2019-09-20 DIAGNOSIS — E1122 Type 2 diabetes mellitus with diabetic chronic kidney disease: Secondary | ICD-10-CM | POA: Diagnosis not present

## 2019-09-20 DIAGNOSIS — D631 Anemia in chronic kidney disease: Secondary | ICD-10-CM | POA: Diagnosis not present

## 2019-09-23 DIAGNOSIS — N2581 Secondary hyperparathyroidism of renal origin: Secondary | ICD-10-CM | POA: Diagnosis not present

## 2019-09-23 DIAGNOSIS — N186 End stage renal disease: Secondary | ICD-10-CM | POA: Diagnosis not present

## 2019-09-23 DIAGNOSIS — Z992 Dependence on renal dialysis: Secondary | ICD-10-CM | POA: Diagnosis not present

## 2019-09-23 DIAGNOSIS — D631 Anemia in chronic kidney disease: Secondary | ICD-10-CM | POA: Diagnosis not present

## 2019-09-23 DIAGNOSIS — E1122 Type 2 diabetes mellitus with diabetic chronic kidney disease: Secondary | ICD-10-CM | POA: Diagnosis not present

## 2019-09-25 DIAGNOSIS — D631 Anemia in chronic kidney disease: Secondary | ICD-10-CM | POA: Diagnosis not present

## 2019-09-25 DIAGNOSIS — N2581 Secondary hyperparathyroidism of renal origin: Secondary | ICD-10-CM | POA: Diagnosis not present

## 2019-09-25 DIAGNOSIS — E1122 Type 2 diabetes mellitus with diabetic chronic kidney disease: Secondary | ICD-10-CM | POA: Diagnosis not present

## 2019-09-25 DIAGNOSIS — N186 End stage renal disease: Secondary | ICD-10-CM | POA: Diagnosis not present

## 2019-09-25 DIAGNOSIS — Z992 Dependence on renal dialysis: Secondary | ICD-10-CM | POA: Diagnosis not present

## 2019-09-26 DIAGNOSIS — C61 Malignant neoplasm of prostate: Secondary | ICD-10-CM | POA: Diagnosis not present

## 2019-09-27 ENCOUNTER — Emergency Department (HOSPITAL_COMMUNITY)
Admission: EM | Admit: 2019-09-27 | Discharge: 2019-09-28 | Disposition: A | Discharge status: Home / Self Care | Payer: Medicare Other | Attending: Emergency Medicine | Admitting: Emergency Medicine

## 2019-09-27 ENCOUNTER — Encounter (HOSPITAL_COMMUNITY): Payer: Self-pay

## 2019-09-27 DIAGNOSIS — E1122 Type 2 diabetes mellitus with diabetic chronic kidney disease: Secondary | ICD-10-CM | POA: Diagnosis not present

## 2019-09-27 DIAGNOSIS — Z8546 Personal history of malignant neoplasm of prostate: Secondary | ICD-10-CM | POA: Diagnosis not present

## 2019-09-27 DIAGNOSIS — N2581 Secondary hyperparathyroidism of renal origin: Secondary | ICD-10-CM | POA: Diagnosis not present

## 2019-09-27 DIAGNOSIS — D649 Anemia, unspecified: Secondary | ICD-10-CM | POA: Diagnosis not present

## 2019-09-27 DIAGNOSIS — Z87891 Personal history of nicotine dependence: Secondary | ICD-10-CM | POA: Insufficient documentation

## 2019-09-27 DIAGNOSIS — N186 End stage renal disease: Secondary | ICD-10-CM

## 2019-09-27 DIAGNOSIS — R55 Syncope and collapse: Secondary | ICD-10-CM | POA: Diagnosis not present

## 2019-09-27 DIAGNOSIS — N189 Chronic kidney disease, unspecified: Secondary | ICD-10-CM

## 2019-09-27 DIAGNOSIS — Z79899 Other long term (current) drug therapy: Secondary | ICD-10-CM | POA: Diagnosis not present

## 2019-09-27 DIAGNOSIS — Z992 Dependence on renal dialysis: Secondary | ICD-10-CM | POA: Insufficient documentation

## 2019-09-27 DIAGNOSIS — I120 Hypertensive chronic kidney disease with stage 5 chronic kidney disease or end stage renal disease: Secondary | ICD-10-CM | POA: Diagnosis not present

## 2019-09-27 DIAGNOSIS — R001 Bradycardia, unspecified: Secondary | ICD-10-CM | POA: Diagnosis not present

## 2019-09-27 DIAGNOSIS — R000 Tachycardia, unspecified: Secondary | ICD-10-CM | POA: Diagnosis not present

## 2019-09-27 DIAGNOSIS — N179 Acute kidney failure, unspecified: Secondary | ICD-10-CM | POA: Diagnosis not present

## 2019-09-27 DIAGNOSIS — Z794 Long term (current) use of insulin: Secondary | ICD-10-CM | POA: Diagnosis not present

## 2019-09-27 DIAGNOSIS — I959 Hypotension, unspecified: Secondary | ICD-10-CM | POA: Insufficient documentation

## 2019-09-27 DIAGNOSIS — D631 Anemia in chronic kidney disease: Secondary | ICD-10-CM

## 2019-09-27 DIAGNOSIS — E1165 Type 2 diabetes mellitus with hyperglycemia: Secondary | ICD-10-CM | POA: Diagnosis not present

## 2019-09-27 LAB — BASIC METABOLIC PANEL
Anion gap: 14 (ref 5–15)
BUN: 24 mg/dL — ABNORMAL HIGH (ref 8–23)
CO2: 27 mmol/L (ref 22–32)
Calcium: 8.9 mg/dL (ref 8.9–10.3)
Chloride: 96 mmol/L — ABNORMAL LOW (ref 98–111)
Creatinine, Ser: 4.83 mg/dL — ABNORMAL HIGH (ref 0.61–1.24)
GFR calc Af Amer: 14 mL/min — ABNORMAL LOW (ref >60)
GFR calc non Af Amer: 12 mL/min — ABNORMAL LOW (ref >60)
Glucose, Bld: 296 mg/dL — ABNORMAL HIGH (ref 70–99)
Potassium: 4.2 mmol/L (ref 3.5–5.1)
Sodium: 137 mmol/L (ref 135–145)

## 2019-09-27 LAB — CBC
HCT: 37.1 % — ABNORMAL LOW (ref 39.0–52.0)
Hemoglobin: 11.8 g/dL — ABNORMAL LOW (ref 13.0–17.0)
MCH: 30.3 pg (ref 26.0–34.0)
MCHC: 31.8 g/dL (ref 30.0–36.0)
MCV: 95.4 fL (ref 80.0–100.0)
Platelets: 176 10*3/uL (ref 150–400)
RBC: 3.89 MIL/uL — ABNORMAL LOW (ref 4.22–5.81)
RDW: 17.9 % — ABNORMAL HIGH (ref 11.5–15.5)
WBC: 4 10*3/uL (ref 4.0–10.5)
nRBC: 0 % (ref 0.0–0.2)

## 2019-09-27 LAB — CBG MONITORING, ED: Glucose-Capillary: 300 mg/dL — ABNORMAL HIGH (ref 70–99)

## 2019-09-27 MED ORDER — SODIUM CHLORIDE 0.9% FLUSH
3.0000 mL | Freq: Once | INTRAVENOUS | Status: AC
  Administered 2019-09-27: 3 mL via INTRAVENOUS

## 2019-09-27 NOTE — ED Provider Notes (Addendum)
[facility] EMERGENCY DEPARTMENT Provider Note   CSN: [id number] Arrival date & time: 09/27/19  2241    History   Chief Complaint Chief Complaint  Patient presents with  . Loss of Consciousness    HPI Jared [name] is a 63 y.o. male.   The history is provided by the patient.  Loss of Consciousness He has history of diabetes, hypertension, end-stage renal disease on hemodialysis and comes in because of an episode of feeling weak and lightheaded.  He has been having similar episodes over the last 1-2 months.  Associated with weakness and lightheadedness was nausea and diaphoresis.  He denies chest pain and denies vomiting.  His blood pressure gets low during these episodes, as low as 70/40.  He has been cutting back on his blood pressure medications but continues to be have episodes.  He was dialyzed today and does not know what his end dialysis weight was compared with his dry weight.  He is not 100% sure which medications have been cut back because his sister is the one who actually administers the medications, but he thinks that he has stopped taking hydralazine, metoprolol, and isosorbide.  Today, EMS did notice blood pressure 70 systolic.  Past Medical History:  Diagnosis Date  . Anemia   . Cancer [facility]    early prostate cancer per patient  . Chronic kidney disease    on dialysis M-W-F  . Diabetes mellitus without complication ([place])    onset as adult  . GERD (gastroesophageal reflux disease)   . Hypertension   . Seizures ([place])    pt states he thinks this is bc of his blood sugar  . Stroke [facility])    patient denies  . Thyroid disease    hyperparathyroidism    Patient Active Problem List   Diagnosis Date Noted  . At risk for bone density loss 12/18/2018  . Hyperosmolality   . Respiratory insufficiency 04/29/2018  . Altered mental status   . Acute metabolic encephalopathy 38/75/6433  . Metabolic encephalopathy 29/51/8841  . Encephalopathy   . Type  2 diabetes mellitus with peripheral neuropathy (HCC)   . History of prostate cancer   . Acute blood loss anemia   . Witnessed seizure-like activity ([place])   . Weakness   . Prostate cancer ([place]) 03/13/2017  . Hyperglycemia 03/13/2017  . Accelerated hypertension 03/13/2017  . Elevated LDL cholesterol level 01/24/2017  . End-stage renal disease on peritoneal dialysis ([place])   . Anemia associated with chronic renal failure   . Abdominal pain 12/08/2016  . Enlarged prostate 12/08/2016  . 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 Hypertension   . CKD (chronic kidney disease) stage V requiring chronic dialysis ([employer])   . Acute respiratory failure ([place])   . Seizure ([place]) 11/24/2016  . Status epilepticus [facility])     Past Surgical History:  Procedure Laterality Date  . AV FISTULA PLACEMENT Left 06/04/2014   Procedure: ARTERIOVENOUS (AV) FISTULA CREATION-  BRACHIOCEPHALIC WITH LIGATION OF COMPETEING BRANCH;  Surgeon: Mal Misty, MD;  Location: [facility];  Service: Vascular;  Laterality: Left;  . INGUINAL HERNIA REPAIR Left 02/19/2018   Procedure: LAPAROSCOPIC LEFT  INGUINAL HERNIA;  Surgeon: Ralene Ok, MD;  Location: [facility];  Service: General;  Laterality: Left;  . INSERTION OF DIALYSIS CATHETER  04/2014  . INSERTION OF MESH Left 02/19/2018   Procedure: INSERTION OF MESH;  Surgeon: Ralene Ok, MD;  Location: [facility];  Service: General;  Laterality: Left;  . IR GENERIC HISTORICAL  Left 11/30/2016   IR THROMBECTOMY AV FISTULA W/THROMBOLYSIS/PTA/STENT INC/SHUNT/IMG LT 11/30/2016 Markus Daft, MD [facility]-INTERV RAD  . IR GENERIC HISTORICAL  11/30/2016   IR US GUIDE VASC ACCESS LEFT 11/30/2016 Markus Daft, MD [facility]INTER[facility] RAD  . IR PARACENTESIS  08/21/2018        Home Medications    Prior to Admission medications   Medication Sig Start Date End Date Taking? Authorizing Provider  amLODipine (NORVASC) 10 MG tablet Take 1 tablet (10 mg total) by mouth daily. 12/02/17   Sheikh, Georgina Quint Latif, DO  atorvastatin (LIPITOR) 20  MG tablet TAKE 1 TABLET BY MOUTH ONCE DAILY 09/06/18   Henson, Vickie L, NP-C  b complex-vitamin c-folic acid (NEPHRO-VITE) 0.8 MG TABS tablet TAKE 1 TABLET BY MOUTH EVERY DAY (ON DIALYSIS DAYS, TAKE AFTER DIALYSIS TREATMENT) 09/25/18   [provider]  cinacalcet (SENSIPAR) 30 MG tablet Take 30 mg by mouth daily. 04/25/18   [provider]  cloNIDine (CATAPRES) 0.3 MG tablet Take 0.3 mg by mouth 2 (two) times daily. as directed 04/24/18   [provider]  hydrALAZINE (APRESOLINE) 100 MG tablet Take 1 tablet (100 mg total) by mouth 3 (three) times daily. 03/27/17   Angiulli, Lavon Paganini, PA-C  insulin NPH Human (NOVOLIN N) 100 UNIT/ML injection Inject 20 units every morning Patient taking differently: Inject 30 Units into the skin daily before breakfast.  04/03/17   Renato Shin, MD  isosorbide mononitrate (IMDUR) 30 MG 24 hr tablet Take 1 tablet by mouth once daily 04/04/19   Raenette Rover, Vickie L, NP-C  levETIRAcetam (KEPPRA) 250 MG tablet Take 1 tablet by mouth twice daily 09/03/19   Frann Rider, NP  metoprolol tartrate (LOPRESSOR) 100 MG tablet Take 1 tablet (100 mg total) by mouth 2 (two) times daily. 05/21/17   Henson, Vickie L, NP-C  ondansetron (ZOFRAN ODT) 4 MG disintegrating tablet Take 1 tablet (4 mg total) by mouth every 8 (eight) hours as needed for nausea or vomiting. 10/23/18   Virgel Manifold, MD  pantoprazole (PROTONIX) 40 MG tablet TAKE 1 TABLET BY MOUTH ONCE DAILY AT 6AM Patient taking differently: TAKE 1 TABLET BY MOUTH twice DAILy 02/01/18   Henson, Vickie L, NP-C  promethazine-dextromethorphan (PROMETHAZINE-DM) 6.25-15 MG/5ML syrup TAKE 5 MLS BY MOUTH 4 TIMES DAILY AS NEEDED 10/21/18   [provider]  sevelamer carbonate (RENVELA) 800 MG tablet Take 3 tablets (2,400 mg total) by mouth 3 (three) times daily with meals. 03/27/17   Angiulli, Lavon Paganini, PA-C    Family History Family History  Problem Relation Age of Onset  . Cancer - Lung Mother   . Cancer -  Other Father   . Diabetes Neg Hx     Social History Social History   Tobacco Use  . Smoking status: Former Smoker    Quit date: 05/11/1984    Years since quitting: 35.4  . Smokeless tobacco: Never Used  Substance Use Topics  . Alcohol use: Yes    Alcohol/week: 1.0 standard drinks    Types: 1 Glasses of wine per week    Comment: occasional  . Drug use: No     Allergies   Penicillins   Review of Systems Review of Systems  Cardiovascular: Positive for syncope.  All other systems reviewed and are negative.    Physical Exam Updated Vital Signs BP 127/69   Pulse 79   Temp 97.6 F (36.4 C)   Resp 20   SpO2 100%   Physical Exam Vitals signs and nursing note reviewed.  63 year old male, resting comfortably and in no acute distress. Vital signs are normal. Oxygen saturation is 100%, which is normal. Head is normocephalic and atraumatic. PERRLA, EOMI. Oropharynx is clear. Neck is nontender and supple without adenopathy or JVD. Back is nontender and there is no CVA tenderness. Lungs are clear without rales, wheezes, or rhonchi. Chest is nontender. Heart has regular rate and rhythm without murmur. Abdomen is soft, flat, nontender without masses or hepatosplenomegaly and peristalsis is normoactive. Extremities have no cyanosis or edema, full range of motion is present.  AV fistula present left upper arm with thrill present. Skin is warm and dry without rash. Neurologic: Mental status is normal, cranial nerves are intact, there are no motor or sensory deficits.  ED Treatments / Results  Labs (all labs ordered are listed, but only abnormal results are displayed) Labs Reviewed  BASIC METABOLIC PANEL - Abnormal; Notable for the following components:      Result Value   Chloride 96 (*)    Glucose, Bld 296 (*)    BUN 24 (*)    Creatinine, Ser 4.83 (*)    GFR calc non Af Amer 12 (*)    GFR calc Af Amer 14 (*)    All other components within normal limits  CBC -  Abnormal; Notable for the following components:   RBC 3.89 (*)    Hemoglobin 11.8 (*)    HCT 37.1 (*)    RDW 17.9 (*)    All other components within normal limits  CBG MONITORING, ED - Abnormal; Notable for the following components:   Glucose-Capillary 300 (*)    All other components within normal limits  TROPONIN I (HIGH SENSITIVITY) - Abnormal; Notable for the following components:   Troponin I (High Sensitivity) 22 (*)    All other components within normal limits  TROPONIN I (HIGH SENSITIVITY) - Abnormal; Notable for the following components:   Troponin I (High Sensitivity) 29 (*)    All other components within normal limits  URINALYSIS, ROUTINE W REFLEX MICROSCOPIC    EKG EKG Interpretation  Date/Time:  Saturday September 27 2019 22:51:53 EDT Ventricular Rate:  73 PR Interval:    QRS Duration: 99 QT Interval:  433 QTC Calculation: 478 R Axis:   -44 Text Interpretation: Sinus rhythm Left axis deviation Borderline prolonged QT interval When compared with ECG of 04/29/2018, No significant change was found Confirmed by Delora Fuel ([id number]) on 09/28/2019 12:14:29 AM   Procedures Procedures  Medications Ordered in ED Medications  sodium chloride flush (NS) 0.9 % injection 3 mL (3 mLs Intravenous Given 09/27/19 2252)     Initial Impression / Assessment and Plan / ED Course  I have reviewed the triage vital signs and the nursing notes.  Pertinent labs & imaging results that were available during my care of the patient were reviewed by me and considered in my medical decision making (see chart for details).  Episodes of low blood pressure.  I have reviewed his medications and discussed with patient importance of his knowing what medications he is taking and keeping good records.  If he is having problems with low blood pressure, I would expect does he would be better served by discontinuing or reducing clonidine and maintaining metoprolol and isosorbide.  Will check screening labs  today, and I have encouraged him to actually coordinate with his nephrologist out of [place] and adjust his medications.  Labs show mild anemia of end-stage renal disease, but no worrisome findings.  Troponin is minimally  elevated, not unexpected in end-stage renal disease.  ECG is unchanged.  Orthostatic vital signs showed no significant change.  He is discharged with instructions to follow-up with his nephrologist to make appropriate adjustments in his medications.  Return precautions discussed.  Final Clinical Impressions(s) / ED Diagnoses   Final diagnoses:  Hypotension, unspecified hypotension type  End-stage renal disease on hemodialysis ([place])  Anemia associated with chronic renal failure    ED Discharge Orders    None       Delora Fuel, MD 57/02/20 [id number]    Delora Fuel, MD 16/75/61 [phone]

## 2019-09-27 NOTE — ED Triage Notes (Signed)
Pt comes via [facility] from home, near syncopal episode, felt weak like his CBG was low, CBG was 200, upon EMS arrival BP was 70/20, pt was cool and diaphoretic, positive orthostatic vitals with EMS. Pt is a dialysis pt, last received today, took off 4 L of fluid.

## 2019-09-28 DIAGNOSIS — N186 End stage renal disease: Secondary | ICD-10-CM | POA: Diagnosis not present

## 2019-09-28 DIAGNOSIS — Z992 Dependence on renal dialysis: Secondary | ICD-10-CM | POA: Diagnosis not present

## 2019-09-28 DIAGNOSIS — I129 Hypertensive chronic kidney disease with stage 1 through stage 4 chronic kidney disease, or unspecified chronic kidney disease: Secondary | ICD-10-CM | POA: Diagnosis not present

## 2019-09-28 DIAGNOSIS — I959 Hypotension, unspecified: Secondary | ICD-10-CM | POA: Diagnosis not present

## 2019-09-28 LAB — TROPONIN I (HIGH SENSITIVITY)
Troponin I (High Sensitivity): 22 ng/L — ABNORMAL HIGH (ref <18)
Troponin I (High Sensitivity): 29 ng/L — ABNORMAL HIGH (ref <18)

## 2019-09-28 NOTE — Discharge Instructions (Addendum)
Please discuss with your nephrologist how to adjust your medications to prevent these episodes of low blood pressure.

## 2019-09-30 DIAGNOSIS — N186 End stage renal disease: Secondary | ICD-10-CM | POA: Diagnosis not present

## 2019-09-30 DIAGNOSIS — D631 Anemia in chronic kidney disease: Secondary | ICD-10-CM | POA: Diagnosis not present

## 2019-09-30 DIAGNOSIS — Z992 Dependence on renal dialysis: Secondary | ICD-10-CM | POA: Diagnosis not present

## 2019-09-30 DIAGNOSIS — E1122 Type 2 diabetes mellitus with diabetic chronic kidney disease: Secondary | ICD-10-CM | POA: Diagnosis not present

## 2019-09-30 DIAGNOSIS — N2581 Secondary hyperparathyroidism of renal origin: Secondary | ICD-10-CM | POA: Diagnosis not present

## 2019-10-02 DIAGNOSIS — D631 Anemia in chronic kidney disease: Secondary | ICD-10-CM | POA: Diagnosis not present

## 2019-10-02 DIAGNOSIS — N186 End stage renal disease: Secondary | ICD-10-CM | POA: Diagnosis not present

## 2019-10-02 DIAGNOSIS — Z992 Dependence on renal dialysis: Secondary | ICD-10-CM | POA: Diagnosis not present

## 2019-10-02 DIAGNOSIS — N2581 Secondary hyperparathyroidism of renal origin: Secondary | ICD-10-CM | POA: Diagnosis not present

## 2019-10-02 DIAGNOSIS — E1122 Type 2 diabetes mellitus with diabetic chronic kidney disease: Secondary | ICD-10-CM | POA: Diagnosis not present

## 2019-10-04 DIAGNOSIS — D631 Anemia in chronic kidney disease: Secondary | ICD-10-CM | POA: Diagnosis not present

## 2019-10-04 DIAGNOSIS — N186 End stage renal disease: Secondary | ICD-10-CM | POA: Diagnosis not present

## 2019-10-04 DIAGNOSIS — N2581 Secondary hyperparathyroidism of renal origin: Secondary | ICD-10-CM | POA: Diagnosis not present

## 2019-10-04 DIAGNOSIS — E1122 Type 2 diabetes mellitus with diabetic chronic kidney disease: Secondary | ICD-10-CM | POA: Diagnosis not present

## 2019-10-04 DIAGNOSIS — Z992 Dependence on renal dialysis: Secondary | ICD-10-CM | POA: Diagnosis not present

## 2019-10-07 DIAGNOSIS — Z992 Dependence on renal dialysis: Secondary | ICD-10-CM | POA: Diagnosis not present

## 2019-10-07 DIAGNOSIS — N2581 Secondary hyperparathyroidism of renal origin: Secondary | ICD-10-CM | POA: Diagnosis not present

## 2019-10-07 DIAGNOSIS — N186 End stage renal disease: Secondary | ICD-10-CM | POA: Diagnosis not present

## 2019-10-07 DIAGNOSIS — D631 Anemia in chronic kidney disease: Secondary | ICD-10-CM | POA: Diagnosis not present

## 2019-10-07 DIAGNOSIS — E1122 Type 2 diabetes mellitus with diabetic chronic kidney disease: Secondary | ICD-10-CM | POA: Diagnosis not present

## 2019-10-09 DIAGNOSIS — E1122 Type 2 diabetes mellitus with diabetic chronic kidney disease: Secondary | ICD-10-CM | POA: Diagnosis not present

## 2019-10-09 DIAGNOSIS — Z992 Dependence on renal dialysis: Secondary | ICD-10-CM | POA: Diagnosis not present

## 2019-10-09 DIAGNOSIS — N186 End stage renal disease: Secondary | ICD-10-CM | POA: Diagnosis not present

## 2019-10-09 DIAGNOSIS — N2581 Secondary hyperparathyroidism of renal origin: Secondary | ICD-10-CM | POA: Diagnosis not present

## 2019-10-09 DIAGNOSIS — D631 Anemia in chronic kidney disease: Secondary | ICD-10-CM | POA: Diagnosis not present

## 2019-10-11 DIAGNOSIS — N2581 Secondary hyperparathyroidism of renal origin: Secondary | ICD-10-CM | POA: Diagnosis not present

## 2019-10-11 DIAGNOSIS — E1122 Type 2 diabetes mellitus with diabetic chronic kidney disease: Secondary | ICD-10-CM | POA: Diagnosis not present

## 2019-10-11 DIAGNOSIS — N186 End stage renal disease: Secondary | ICD-10-CM | POA: Diagnosis not present

## 2019-10-11 DIAGNOSIS — Z992 Dependence on renal dialysis: Secondary | ICD-10-CM | POA: Diagnosis not present

## 2019-10-11 DIAGNOSIS — D631 Anemia in chronic kidney disease: Secondary | ICD-10-CM | POA: Diagnosis not present

## 2019-10-14 DIAGNOSIS — E1122 Type 2 diabetes mellitus with diabetic chronic kidney disease: Secondary | ICD-10-CM | POA: Diagnosis not present

## 2019-10-14 DIAGNOSIS — N186 End stage renal disease: Secondary | ICD-10-CM | POA: Diagnosis not present

## 2019-10-14 DIAGNOSIS — Z992 Dependence on renal dialysis: Secondary | ICD-10-CM | POA: Diagnosis not present

## 2019-10-14 DIAGNOSIS — N2581 Secondary hyperparathyroidism of renal origin: Secondary | ICD-10-CM | POA: Diagnosis not present

## 2019-10-14 DIAGNOSIS — D631 Anemia in chronic kidney disease: Secondary | ICD-10-CM | POA: Diagnosis not present

## 2019-10-16 DIAGNOSIS — E1122 Type 2 diabetes mellitus with diabetic chronic kidney disease: Secondary | ICD-10-CM | POA: Diagnosis not present

## 2019-10-16 DIAGNOSIS — Z992 Dependence on renal dialysis: Secondary | ICD-10-CM | POA: Diagnosis not present

## 2019-10-16 DIAGNOSIS — R310 Gross hematuria: Secondary | ICD-10-CM | POA: Diagnosis not present

## 2019-10-16 DIAGNOSIS — N2581 Secondary hyperparathyroidism of renal origin: Secondary | ICD-10-CM | POA: Diagnosis not present

## 2019-10-16 DIAGNOSIS — D631 Anemia in chronic kidney disease: Secondary | ICD-10-CM | POA: Diagnosis not present

## 2019-10-16 DIAGNOSIS — C61 Malignant neoplasm of prostate: Secondary | ICD-10-CM | POA: Diagnosis not present

## 2019-10-16 DIAGNOSIS — N281 Cyst of kidney, acquired: Secondary | ICD-10-CM | POA: Diagnosis not present

## 2019-10-16 DIAGNOSIS — N186 End stage renal disease: Secondary | ICD-10-CM | POA: Diagnosis not present

## 2019-10-16 DIAGNOSIS — C775 Secondary and unspecified malignant neoplasm of intrapelvic lymph nodes: Secondary | ICD-10-CM | POA: Diagnosis not present

## 2019-10-18 DIAGNOSIS — N186 End stage renal disease: Secondary | ICD-10-CM | POA: Diagnosis not present

## 2019-10-18 DIAGNOSIS — N2581 Secondary hyperparathyroidism of renal origin: Secondary | ICD-10-CM | POA: Diagnosis not present

## 2019-10-18 DIAGNOSIS — Z992 Dependence on renal dialysis: Secondary | ICD-10-CM | POA: Diagnosis not present

## 2019-10-18 DIAGNOSIS — E1122 Type 2 diabetes mellitus with diabetic chronic kidney disease: Secondary | ICD-10-CM | POA: Diagnosis not present

## 2019-10-18 DIAGNOSIS — D631 Anemia in chronic kidney disease: Secondary | ICD-10-CM | POA: Diagnosis not present

## 2019-10-20 DIAGNOSIS — N2581 Secondary hyperparathyroidism of renal origin: Secondary | ICD-10-CM | POA: Diagnosis not present

## 2019-10-20 DIAGNOSIS — D631 Anemia in chronic kidney disease: Secondary | ICD-10-CM | POA: Diagnosis not present

## 2019-10-20 DIAGNOSIS — E1122 Type 2 diabetes mellitus with diabetic chronic kidney disease: Secondary | ICD-10-CM | POA: Diagnosis not present

## 2019-10-20 DIAGNOSIS — N186 End stage renal disease: Secondary | ICD-10-CM | POA: Diagnosis not present

## 2019-10-20 DIAGNOSIS — Z992 Dependence on renal dialysis: Secondary | ICD-10-CM | POA: Diagnosis not present

## 2019-10-22 DIAGNOSIS — Z992 Dependence on renal dialysis: Secondary | ICD-10-CM | POA: Diagnosis not present

## 2019-10-22 DIAGNOSIS — N2581 Secondary hyperparathyroidism of renal origin: Secondary | ICD-10-CM | POA: Diagnosis not present

## 2019-10-22 DIAGNOSIS — N186 End stage renal disease: Secondary | ICD-10-CM | POA: Diagnosis not present

## 2019-10-22 DIAGNOSIS — E1122 Type 2 diabetes mellitus with diabetic chronic kidney disease: Secondary | ICD-10-CM | POA: Diagnosis not present

## 2019-10-22 DIAGNOSIS — D631 Anemia in chronic kidney disease: Secondary | ICD-10-CM | POA: Diagnosis not present

## 2019-10-25 DIAGNOSIS — N186 End stage renal disease: Secondary | ICD-10-CM | POA: Diagnosis not present

## 2019-10-25 DIAGNOSIS — E1122 Type 2 diabetes mellitus with diabetic chronic kidney disease: Secondary | ICD-10-CM | POA: Diagnosis not present

## 2019-10-25 DIAGNOSIS — Z992 Dependence on renal dialysis: Secondary | ICD-10-CM | POA: Diagnosis not present

## 2019-10-25 DIAGNOSIS — D631 Anemia in chronic kidney disease: Secondary | ICD-10-CM | POA: Diagnosis not present

## 2019-10-25 DIAGNOSIS — N2581 Secondary hyperparathyroidism of renal origin: Secondary | ICD-10-CM | POA: Diagnosis not present

## 2019-10-29 ENCOUNTER — Other Ambulatory Visit: Payer: Self-pay | Admitting: Family Medicine

## 2019-11-11 ENCOUNTER — Other Ambulatory Visit: Payer: Self-pay | Admitting: Family Medicine

## 2019-11-28 DIAGNOSIS — Z992 Dependence on renal dialysis: Secondary | ICD-10-CM | POA: Diagnosis not present

## 2019-11-28 DIAGNOSIS — I129 Hypertensive chronic kidney disease with stage 1 through stage 4 chronic kidney disease, or unspecified chronic kidney disease: Secondary | ICD-10-CM | POA: Diagnosis not present

## 2019-11-28 DIAGNOSIS — N186 End stage renal disease: Secondary | ICD-10-CM | POA: Diagnosis not present

## 2019-11-30 DIAGNOSIS — N2581 Secondary hyperparathyroidism of renal origin: Secondary | ICD-10-CM | POA: Diagnosis not present

## 2019-11-30 DIAGNOSIS — E1122 Type 2 diabetes mellitus with diabetic chronic kidney disease: Secondary | ICD-10-CM | POA: Diagnosis not present

## 2019-11-30 DIAGNOSIS — Z992 Dependence on renal dialysis: Secondary | ICD-10-CM | POA: Diagnosis not present

## 2019-11-30 DIAGNOSIS — N186 End stage renal disease: Secondary | ICD-10-CM | POA: Diagnosis not present

## 2019-12-02 DIAGNOSIS — E1122 Type 2 diabetes mellitus with diabetic chronic kidney disease: Secondary | ICD-10-CM | POA: Diagnosis not present

## 2019-12-02 DIAGNOSIS — N186 End stage renal disease: Secondary | ICD-10-CM | POA: Diagnosis not present

## 2019-12-02 DIAGNOSIS — N2581 Secondary hyperparathyroidism of renal origin: Secondary | ICD-10-CM | POA: Diagnosis not present

## 2019-12-02 DIAGNOSIS — Z992 Dependence on renal dialysis: Secondary | ICD-10-CM | POA: Diagnosis not present

## 2019-12-04 DIAGNOSIS — N2581 Secondary hyperparathyroidism of renal origin: Secondary | ICD-10-CM | POA: Diagnosis not present

## 2019-12-04 DIAGNOSIS — N186 End stage renal disease: Secondary | ICD-10-CM | POA: Diagnosis not present

## 2019-12-04 DIAGNOSIS — E1122 Type 2 diabetes mellitus with diabetic chronic kidney disease: Secondary | ICD-10-CM | POA: Diagnosis not present

## 2019-12-04 DIAGNOSIS — Z992 Dependence on renal dialysis: Secondary | ICD-10-CM | POA: Diagnosis not present

## 2019-12-06 DIAGNOSIS — N186 End stage renal disease: Secondary | ICD-10-CM | POA: Diagnosis not present

## 2019-12-06 DIAGNOSIS — E1122 Type 2 diabetes mellitus with diabetic chronic kidney disease: Secondary | ICD-10-CM | POA: Diagnosis not present

## 2019-12-06 DIAGNOSIS — Z992 Dependence on renal dialysis: Secondary | ICD-10-CM | POA: Diagnosis not present

## 2019-12-06 DIAGNOSIS — N2581 Secondary hyperparathyroidism of renal origin: Secondary | ICD-10-CM | POA: Diagnosis not present

## 2019-12-09 DIAGNOSIS — E1122 Type 2 diabetes mellitus with diabetic chronic kidney disease: Secondary | ICD-10-CM | POA: Diagnosis not present

## 2019-12-09 DIAGNOSIS — Z992 Dependence on renal dialysis: Secondary | ICD-10-CM | POA: Diagnosis not present

## 2019-12-09 DIAGNOSIS — N2581 Secondary hyperparathyroidism of renal origin: Secondary | ICD-10-CM | POA: Diagnosis not present

## 2019-12-09 DIAGNOSIS — N186 End stage renal disease: Secondary | ICD-10-CM | POA: Diagnosis not present

## 2019-12-11 DIAGNOSIS — N186 End stage renal disease: Secondary | ICD-10-CM | POA: Diagnosis not present

## 2019-12-11 DIAGNOSIS — E1122 Type 2 diabetes mellitus with diabetic chronic kidney disease: Secondary | ICD-10-CM | POA: Diagnosis not present

## 2019-12-11 DIAGNOSIS — Z992 Dependence on renal dialysis: Secondary | ICD-10-CM | POA: Diagnosis not present

## 2019-12-11 DIAGNOSIS — N2581 Secondary hyperparathyroidism of renal origin: Secondary | ICD-10-CM | POA: Diagnosis not present

## 2019-12-13 DIAGNOSIS — N2581 Secondary hyperparathyroidism of renal origin: Secondary | ICD-10-CM | POA: Diagnosis not present

## 2019-12-13 DIAGNOSIS — Z992 Dependence on renal dialysis: Secondary | ICD-10-CM | POA: Diagnosis not present

## 2019-12-13 DIAGNOSIS — N186 End stage renal disease: Secondary | ICD-10-CM | POA: Diagnosis not present

## 2019-12-13 DIAGNOSIS — E1122 Type 2 diabetes mellitus with diabetic chronic kidney disease: Secondary | ICD-10-CM | POA: Diagnosis not present

## 2019-12-16 DIAGNOSIS — N186 End stage renal disease: Secondary | ICD-10-CM | POA: Diagnosis not present

## 2019-12-16 DIAGNOSIS — N2581 Secondary hyperparathyroidism of renal origin: Secondary | ICD-10-CM | POA: Diagnosis not present

## 2019-12-16 DIAGNOSIS — E1122 Type 2 diabetes mellitus with diabetic chronic kidney disease: Secondary | ICD-10-CM | POA: Diagnosis not present

## 2019-12-16 DIAGNOSIS — Z992 Dependence on renal dialysis: Secondary | ICD-10-CM | POA: Diagnosis not present

## 2019-12-18 DIAGNOSIS — E1122 Type 2 diabetes mellitus with diabetic chronic kidney disease: Secondary | ICD-10-CM | POA: Diagnosis not present

## 2019-12-18 DIAGNOSIS — Z992 Dependence on renal dialysis: Secondary | ICD-10-CM | POA: Diagnosis not present

## 2019-12-18 DIAGNOSIS — N2581 Secondary hyperparathyroidism of renal origin: Secondary | ICD-10-CM | POA: Diagnosis not present

## 2019-12-18 DIAGNOSIS — N186 End stage renal disease: Secondary | ICD-10-CM | POA: Diagnosis not present

## 2019-12-20 DIAGNOSIS — E1122 Type 2 diabetes mellitus with diabetic chronic kidney disease: Secondary | ICD-10-CM | POA: Diagnosis not present

## 2019-12-20 DIAGNOSIS — N186 End stage renal disease: Secondary | ICD-10-CM | POA: Diagnosis not present

## 2019-12-20 DIAGNOSIS — N2581 Secondary hyperparathyroidism of renal origin: Secondary | ICD-10-CM | POA: Diagnosis not present

## 2019-12-20 DIAGNOSIS — Z992 Dependence on renal dialysis: Secondary | ICD-10-CM | POA: Diagnosis not present

## 2019-12-23 DIAGNOSIS — Z992 Dependence on renal dialysis: Secondary | ICD-10-CM | POA: Diagnosis not present

## 2019-12-23 DIAGNOSIS — E1122 Type 2 diabetes mellitus with diabetic chronic kidney disease: Secondary | ICD-10-CM | POA: Diagnosis not present

## 2019-12-23 DIAGNOSIS — N186 End stage renal disease: Secondary | ICD-10-CM | POA: Diagnosis not present

## 2019-12-23 DIAGNOSIS — N2581 Secondary hyperparathyroidism of renal origin: Secondary | ICD-10-CM | POA: Diagnosis not present

## 2019-12-25 DIAGNOSIS — N186 End stage renal disease: Secondary | ICD-10-CM | POA: Diagnosis not present

## 2019-12-25 DIAGNOSIS — Z992 Dependence on renal dialysis: Secondary | ICD-10-CM | POA: Diagnosis not present

## 2019-12-25 DIAGNOSIS — E1122 Type 2 diabetes mellitus with diabetic chronic kidney disease: Secondary | ICD-10-CM | POA: Diagnosis not present

## 2019-12-25 DIAGNOSIS — N2581 Secondary hyperparathyroidism of renal origin: Secondary | ICD-10-CM | POA: Diagnosis not present

## 2019-12-27 DIAGNOSIS — Z992 Dependence on renal dialysis: Secondary | ICD-10-CM | POA: Diagnosis not present

## 2019-12-27 DIAGNOSIS — E1122 Type 2 diabetes mellitus with diabetic chronic kidney disease: Secondary | ICD-10-CM | POA: Diagnosis not present

## 2019-12-27 DIAGNOSIS — N186 End stage renal disease: Secondary | ICD-10-CM | POA: Diagnosis not present

## 2019-12-27 DIAGNOSIS — N2581 Secondary hyperparathyroidism of renal origin: Secondary | ICD-10-CM | POA: Diagnosis not present

## 2019-12-29 DIAGNOSIS — N186 End stage renal disease: Secondary | ICD-10-CM | POA: Diagnosis not present

## 2019-12-29 DIAGNOSIS — I129 Hypertensive chronic kidney disease with stage 1 through stage 4 chronic kidney disease, or unspecified chronic kidney disease: Secondary | ICD-10-CM | POA: Diagnosis not present

## 2019-12-29 DIAGNOSIS — Z992 Dependence on renal dialysis: Secondary | ICD-10-CM | POA: Diagnosis not present

## 2019-12-30 DIAGNOSIS — E1122 Type 2 diabetes mellitus with diabetic chronic kidney disease: Secondary | ICD-10-CM | POA: Diagnosis not present

## 2019-12-30 DIAGNOSIS — N2581 Secondary hyperparathyroidism of renal origin: Secondary | ICD-10-CM | POA: Diagnosis not present

## 2019-12-30 DIAGNOSIS — D631 Anemia in chronic kidney disease: Secondary | ICD-10-CM | POA: Diagnosis not present

## 2019-12-30 DIAGNOSIS — E875 Hyperkalemia: Secondary | ICD-10-CM | POA: Diagnosis not present

## 2019-12-30 DIAGNOSIS — N186 End stage renal disease: Secondary | ICD-10-CM | POA: Diagnosis not present

## 2019-12-30 DIAGNOSIS — Z992 Dependence on renal dialysis: Secondary | ICD-10-CM | POA: Diagnosis not present

## 2019-12-31 ENCOUNTER — Ambulatory Visit: Payer: Medicare Other | Admitting: Adult Health

## 2019-12-31 NOTE — Progress Notes (Deleted)
[name] [address] [place]. [place] [address] [phone]       OFFICE FOLLOW UP VISIT NOTE  Jared. Jared [name]. Date of Birth:  [birth year] Medical Record Number:  [id number]   Referring MD:  [name]  Reason for Referral:  Seizure  No chief complaint on file.    HPI: Initial Consult : Jared [name] is a 73 year African-American male who had a episode of witnessed seizure in the hospital when he was admitted recently.Jared [name] is an 64 y.o. male who presents to the ED via EMS after sudden onset of unresponsiveness at home.   His first symptom was severe headache, unknown if lateralized. Symptoms progressed to include right sided tingling, RUE jerking, leaning to the right and aphasia. He then, per family, leaned forward in his chair without complete loss of postural tone and started "jerking all over". At the time of EMS arrival, the jerking had stopped. On arrival to the ED, he began seizing. Seizure was generalized, with eyes deviated to the right and right > left jerking movements. Seizure was aborted with 2 mg IV Ativan.  CBG was 700 on arrival. Severely hypertensive and tachycardic as well.  Brief Neurological exam performed after Ativan administration and prior to intubation (premedicated with Etomidate 20mg  IV andRocuronium 80 mg IV, followed by propofol gtt at 10 mcg/kg/min)  Bu neurohospitalist Dr [name] revealed decreased tone in all 4 extremities with slightly more movement on the right relative to the left with noxious stimuli. Pupils were pinpoint and unreactive. Neck was supple. Sonorous breathing. Unarousable with sternal rub. Eyes were closed but remained partially open after eyelids manually raised. No doll's eye reflex was present. Reflexes were hypoactive x 4 with mute plantars.  Patient did not abuse alcohol or benzodiazepines per family. EEG was abnormal but showed only mild generalized nonspecific slowing without definite  epileptiform activity. MRI scan the brain did not show any acute infarct or other pathology. Patient was started on Keppra and is seizure was felt to be symptomatic from significantly elevated blood sugar of 700. Patient was initially started on Versed drip which was tapered. Patient was advised to taper his Keppra over a week which she has done. He has had no further recurrent seizures. 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 Patient was asked not to drive till he saw me but still has been doing some limited driving without any events. He has no prior history of seizures ,significant head injury with loss of consciousness. He denies drinking significant amounts of alcohol or doing any street drugs. There is no family history of epilepsy. Update 12/04/2017 Jared Givens, NP ) He returns today after hospitalization.  The patient was recently hospitalized for metabolic encephalopathy.  He presented to the emergency room with hypertension and blood sugar greater than 1000.  The patient is also end-stage renal disease.  The patient had a questionable seizure event in the hospital.  He was evaluated by the hospital neurologist who recommended restarting Keppra 250 mg twice a day with the plan of eventually tapering off this medication in the future.  It was felt that the patient's seizure event was caused by encephalopathy r/t to hypertension and hyperglycemia.  The patient reports no additional seizure activity.  He has actually only been taking Keppra 250 mg daily.  He returns today for an evaluation.  Update 02/05/2018 Dr [name]: He returns for follow-up after last visit with Jared [name]  nurse practitioner 2 months ago. He continues to do well without any recurrent seizure episodes. He has reduced the dose of Keppra to 250 twice daily. He has a left groin hernia and plans to undergo surgery on 02/19/18. Patient has no new complaints today.  Update  12/05/2018: Patient is being seen today for 30-month follow-up.  It was recommended to start tapering Keppra as normal brain MRI imaging, EEG and evidence of reversible causes.  On 04/29/2018, patient was admitted for breakthrough seizures and severe metabolic encephalopathy in the setting of severe hyperglycemia, severe sepsis with aspiration pneumonia, and malignant hypertension.  EEG abnormal due to moderate diffuse suppression and background slowing which can be seen with hypoxic/ischemic injury, toxic/metabolic encephalopathy, neurodegenerative disorders or medication effect.  No evidence of electrographic seizures and absence of epileptic discharges.  Keppra restarted at that time for seizure prevention.  He states since this time he has been doing well without any recurrent seizure activity.  He continues on Keppra 250 mg twice daily without reported side effects.  He continues to monitor his glucose levels approximately 8-10 times per day as he is fearful of having another episode of hyperglycemia.  He states his glucose levels have been averaging around 200 which he is happy with.  He continues to follow with his PCP for DM management.  No further concerns at this time.  Update 08/26/2019: Jared [name] is being seen today for seizure follow-up.  He has continued on Keppra 250 mg twice daily without any recurrent seizure activity and tolerating well.  He is questioning need of continuation of Keppra as seizures are likely due to severe metabolic encephalopathy (see above note for additional information).  Glucose levels have been stable and typically range 150-200 and continues to follow closely with DM provider for ongoing management.  No further concerns at this time.  Update 12/31/2019: Jared [name] is a 64 year old male who is being seen today for seizure follow-up.  He has continued on Keppra 250 mg twice daily and denies any recent seizure activity or side effects.  At prior visit, discussion regarding  reducing Keppra dosage further per patient request and advised to obtain EEG prior.  It does not appear as though EEG has been completed at this time.  Continues to follow with nephrology with continuing to receive HD for ESRD.  Blood pressure ***.  No further concerns at this time.      ROS:   14 system review of systems is positive for seizures and all other systems negative   PMH:  Past Medical History:  Diagnosis Date  . Anemia   . Cancer [facility])    early prostate cancer per patient  . Chronic kidney disease    on dialysis M-W-F  . Diabetes mellitus without complication ([place])    onset as adult  . GERD (gastroesophageal reflux disease)   . Hypertension   . Seizures ([place])    pt states he thinks this is bc of his blood sugar  . Stroke [facility])    patient denies  . Thyroid disease    hyperparathyroidism    Social History:  Social History   Socioeconomic History  . Marital status: Divorced    Spouse name: Not on file  . Number of children: Not on file  . Years of education: Not on file  . Highest education level: Not on file  Occupational History  . Not on file  Tobacco Use  . Smoking status: Former Smoker    Quit date: 05/11/1984  Years since quitting: 35.6  . Smokeless tobacco: Never Used  Substance and Sexual Activity  . Alcohol use: Yes    Alcohol/week: 1.0 standard drinks    Types: 1 Glasses of wine per week    Comment: occasional  . Drug use: No  . Sexual activity: Not on file  Other Topics Concern  . Not on file  Social History Narrative  . Not on file   Social Determinants of Health   Financial Resource Strain:   . Difficulty of Paying Living Expenses: Not on file  Food Insecurity:   . Worried About Charity fundraiser in the Last Year: Not on file  . Ran Out of Food in the Last Year: Not on file  Transportation Needs:   . Lack of Transportation (Medical): Not on file  . Lack of Transportation (Non-Medical): Not on file  Physical Activity:   .  Days of Exercise per Week: Not on file  . Minutes of Exercise per Session: Not on file  Stress:   . Feeling of Stress : Not on file  Social Connections:   . Frequency of Communication with Friends and Family: Not on file  . Frequency of Social Gatherings with Friends and Family: Not on file  . Attends Religious Services: Not on file  . Active Member of Clubs or Organizations: Not on file  . Attends Archivist Meetings: Not on file  . Marital Status: Not on file  Intimate Partner Violence:   . Fear of Current or Ex-Partner: Not on file  . Emotionally Abused: Not on file  . Physically Abused: Not on file  . Sexually Abused: Not on file    Medications:   Current Outpatient Medications on File Prior to Visit  Medication Sig Dispense Refill  . amLODipine (NORVASC) 10 MG tablet Take 1 tablet (10 mg total) by mouth daily. 30 tablet 0  . atorvastatin (LIPITOR) 20 MG tablet TAKE 1 TABLET BY MOUTH ONCE DAILY 90 tablet 0  . b complex-vitamin c-folic acid (NEPHRO-VITE) 0.8 MG TABS tablet TAKE 1 TABLET BY MOUTH EVERY DAY (ON DIALYSIS DAYS, TAKE AFTER DIALYSIS TREATMENT)  4  . cinacalcet (SENSIPAR) 30 MG tablet Take 30 mg by mouth daily.  3  . cloNIDine (CATAPRES) 0.3 MG tablet Take 0.3 mg by mouth 2 (two) times daily. as directed  5  . hydrALAZINE (APRESOLINE) 100 MG tablet Take 1 tablet (100 mg total) by mouth 3 (three) times daily. 90 tablet 0  . insulin NPH Human (NOVOLIN N) 100 UNIT/ML injection Inject 20 units every morning (Patient taking differently: Inject 30 Units into the skin daily before breakfast. ) 10 mL 5  . isosorbide mononitrate (IMDUR) 30 MG 24 hr tablet Take 1 tablet by mouth once daily 30 tablet 0  . levETIRAcetam (KEPPRA) 250 MG tablet Take 1 tablet by mouth twice daily 180 tablet 1  . metoprolol tartrate (LOPRESSOR) 100 MG tablet Take 1 tablet (100 mg total) by mouth 2 (two) times daily. 60 tablet 1  . ondansetron (ZOFRAN ODT) 4 MG disintegrating tablet Take 1 tablet  (4 mg total) by mouth every 8 (eight) hours as needed for nausea or vomiting. 20 tablet 0  . pantoprazole (PROTONIX) 40 MG tablet TAKE 1 TABLET BY MOUTH ONCE DAILY AT 6AM (Patient taking differently: TAKE 1 TABLET BY MOUTH twice DAILy) 90 tablet 1  . promethazine-dextromethorphan (PROMETHAZINE-DM) 6.25-15 MG/5ML syrup TAKE 5 MLS BY MOUTH 4 TIMES DAILY AS NEEDED  0  . sevelamer carbonate (RENVELA)  800 MG tablet Take 3 tablets (2,400 mg total) by mouth 3 (three) times daily with meals. 240 tablet 1   No current facility-administered medications on file prior to visit.    Allergies:   Allergies  Allergen Reactions  . Penicillins Other (See Comments)    Tolerates cephalosporins  UNSPECIFIED REACTION  Has patient had a PCN reaction causing immediate rash, facial/tongue/throat swelling, SOB or lightheadedness with hypotension: Unk Has patient had a PCN reaction causing severe rash involving mucus membranes or skin necrosis: Unk Has patient had a PCN reaction that required hospitalization: Unk Has patient had a PCN reaction occurring within the last 10 years: Unk If all of the above answers are "NO", then may proceed with Cephalosporin use.     Physical Exam  There were no vitals filed for this visit. There is no height or weight on file to calculate BMI.   General: Frail pleasant middle-aged African-American male, seated, in no evident distress Head: head normocephalic and atraumatic.   Neck: supple with no carotid or supraclavicular bruits Cardiovascular: regular rate and rhythm, no murmurs Musculoskeletal: no deformity Skin:  no rash/petichiae Vascular:  Normal pulses all extremities  Neurologic Exam Mental Status: Awake and fully alert. Oriented to place and time. Recent and remote memory intact. Attention span, concentration and fund of knowledge appropriate. Mood and affect appropriate.   Cranial Nerves: Pupils equal, briskly reactive to light. Extraocular movements full without  nystagmus. Visual fields full to confrontation. Hearing intact. Facial sensation intact. Face, tongue, palate moves normally and symmetrically.  Motor: Normal bulk and tone. Normal strength in all tested extremity muscles. Sensory.: intact to touch , pinprick , position and vibratory sensation.  Coordination: Rapid alternating movements normal in all extremities. Finger-to-nose and heel-to-shin performed accurately bilaterally. Gait and Station: Arises from chair without difficulty. Stance is normal. Gait demonstrates normal stride length and balance . Able to heel, toe and tandem walk without difficulty.  Reflexes: 1+ and symmetric. Toes downgoing.      ASSESSMENT: Jared [name] is a 64 year old male with history of seizure provoked by hyperglycemia in 10/2016 and unfortunately had a recurrence of additional seizure episode provoked by hyperglycemia in 04/2018.  He is being seen today for follow-up visit without any additional seizure activity and satisfactory glucose levels.  He is questioning discontinuing Keppra as glucose levels have been stable.    PLAN: Continue Keppra 250 mg twice daily at this time for seizure prevention Repeat EEG as prior EEG abnormal during the severe metabolic encephalopathy.  If normal EEG, will initiate tapering to discontinue Keppra.  Discussion with patient regarding possible recurrent seizure activity with discontinuing or tapering Keppra.  Patient verbalized understanding would like to proceed.  Advised to continue to follow with PCP for DM management.    Follow-up in 4 months or call earlier if needed   Greater than 50% time during this 25 minute  visit was spent on counseling and coordination of care about seizures, provoking triggers, potential recurrent seizures with discontinuing Keppra and indication for repeat EEG and answering all questions to patient satisfaction    Frann Rider, AGNP-BC  [facility] [address]  [place], [place] [address]  Phone [phone] Fax [phone] Note: This document was prepared with digital dictation and possible smart phrase technology. Any transcriptional errors that result from this process are unintentional.

## 2020-01-01 DIAGNOSIS — E875 Hyperkalemia: Secondary | ICD-10-CM | POA: Diagnosis not present

## 2020-01-01 DIAGNOSIS — N186 End stage renal disease: Secondary | ICD-10-CM | POA: Diagnosis not present

## 2020-01-01 DIAGNOSIS — D631 Anemia in chronic kidney disease: Secondary | ICD-10-CM | POA: Diagnosis not present

## 2020-01-01 DIAGNOSIS — N2581 Secondary hyperparathyroidism of renal origin: Secondary | ICD-10-CM | POA: Diagnosis not present

## 2020-01-01 DIAGNOSIS — Z992 Dependence on renal dialysis: Secondary | ICD-10-CM | POA: Diagnosis not present

## 2020-01-01 DIAGNOSIS — E1122 Type 2 diabetes mellitus with diabetic chronic kidney disease: Secondary | ICD-10-CM | POA: Diagnosis not present

## 2020-01-03 DIAGNOSIS — N2581 Secondary hyperparathyroidism of renal origin: Secondary | ICD-10-CM | POA: Diagnosis not present

## 2020-01-03 DIAGNOSIS — D631 Anemia in chronic kidney disease: Secondary | ICD-10-CM | POA: Diagnosis not present

## 2020-01-03 DIAGNOSIS — E1122 Type 2 diabetes mellitus with diabetic chronic kidney disease: Secondary | ICD-10-CM | POA: Diagnosis not present

## 2020-01-03 DIAGNOSIS — Z992 Dependence on renal dialysis: Secondary | ICD-10-CM | POA: Diagnosis not present

## 2020-01-03 DIAGNOSIS — N186 End stage renal disease: Secondary | ICD-10-CM | POA: Diagnosis not present

## 2020-01-03 DIAGNOSIS — E875 Hyperkalemia: Secondary | ICD-10-CM | POA: Diagnosis not present

## 2020-01-05 ENCOUNTER — Encounter: Payer: Self-pay | Admitting: Adult Health

## 2020-01-06 DIAGNOSIS — N2581 Secondary hyperparathyroidism of renal origin: Secondary | ICD-10-CM | POA: Diagnosis not present

## 2020-01-06 DIAGNOSIS — Z992 Dependence on renal dialysis: Secondary | ICD-10-CM | POA: Diagnosis not present

## 2020-01-06 DIAGNOSIS — N186 End stage renal disease: Secondary | ICD-10-CM | POA: Diagnosis not present

## 2020-01-06 DIAGNOSIS — D631 Anemia in chronic kidney disease: Secondary | ICD-10-CM | POA: Diagnosis not present

## 2020-01-06 DIAGNOSIS — E1122 Type 2 diabetes mellitus with diabetic chronic kidney disease: Secondary | ICD-10-CM | POA: Diagnosis not present

## 2020-01-06 DIAGNOSIS — E875 Hyperkalemia: Secondary | ICD-10-CM | POA: Diagnosis not present

## 2020-01-08 DIAGNOSIS — D631 Anemia in chronic kidney disease: Secondary | ICD-10-CM | POA: Diagnosis not present

## 2020-01-08 DIAGNOSIS — N186 End stage renal disease: Secondary | ICD-10-CM | POA: Diagnosis not present

## 2020-01-08 DIAGNOSIS — E1122 Type 2 diabetes mellitus with diabetic chronic kidney disease: Secondary | ICD-10-CM | POA: Diagnosis not present

## 2020-01-08 DIAGNOSIS — N2581 Secondary hyperparathyroidism of renal origin: Secondary | ICD-10-CM | POA: Diagnosis not present

## 2020-01-08 DIAGNOSIS — Z992 Dependence on renal dialysis: Secondary | ICD-10-CM | POA: Diagnosis not present

## 2020-01-08 DIAGNOSIS — E875 Hyperkalemia: Secondary | ICD-10-CM | POA: Diagnosis not present

## 2020-01-10 DIAGNOSIS — N186 End stage renal disease: Secondary | ICD-10-CM | POA: Diagnosis not present

## 2020-01-10 DIAGNOSIS — N2581 Secondary hyperparathyroidism of renal origin: Secondary | ICD-10-CM | POA: Diagnosis not present

## 2020-01-10 DIAGNOSIS — Z992 Dependence on renal dialysis: Secondary | ICD-10-CM | POA: Diagnosis not present

## 2020-01-10 DIAGNOSIS — E875 Hyperkalemia: Secondary | ICD-10-CM | POA: Diagnosis not present

## 2020-01-10 DIAGNOSIS — E1122 Type 2 diabetes mellitus with diabetic chronic kidney disease: Secondary | ICD-10-CM | POA: Diagnosis not present

## 2020-01-10 DIAGNOSIS — D631 Anemia in chronic kidney disease: Secondary | ICD-10-CM | POA: Diagnosis not present

## 2020-01-13 DIAGNOSIS — D631 Anemia in chronic kidney disease: Secondary | ICD-10-CM | POA: Diagnosis not present

## 2020-01-13 DIAGNOSIS — N186 End stage renal disease: Secondary | ICD-10-CM | POA: Diagnosis not present

## 2020-01-13 DIAGNOSIS — Z992 Dependence on renal dialysis: Secondary | ICD-10-CM | POA: Diagnosis not present

## 2020-01-13 DIAGNOSIS — E1122 Type 2 diabetes mellitus with diabetic chronic kidney disease: Secondary | ICD-10-CM | POA: Diagnosis not present

## 2020-01-13 DIAGNOSIS — E875 Hyperkalemia: Secondary | ICD-10-CM | POA: Diagnosis not present

## 2020-01-13 DIAGNOSIS — N2581 Secondary hyperparathyroidism of renal origin: Secondary | ICD-10-CM | POA: Diagnosis not present

## 2020-01-15 DIAGNOSIS — E875 Hyperkalemia: Secondary | ICD-10-CM | POA: Diagnosis not present

## 2020-01-15 DIAGNOSIS — E1122 Type 2 diabetes mellitus with diabetic chronic kidney disease: Secondary | ICD-10-CM | POA: Diagnosis not present

## 2020-01-15 DIAGNOSIS — Z992 Dependence on renal dialysis: Secondary | ICD-10-CM | POA: Diagnosis not present

## 2020-01-15 DIAGNOSIS — N186 End stage renal disease: Secondary | ICD-10-CM | POA: Diagnosis not present

## 2020-01-15 DIAGNOSIS — D631 Anemia in chronic kidney disease: Secondary | ICD-10-CM | POA: Diagnosis not present

## 2020-01-15 DIAGNOSIS — N2581 Secondary hyperparathyroidism of renal origin: Secondary | ICD-10-CM | POA: Diagnosis not present

## 2020-01-17 DIAGNOSIS — D631 Anemia in chronic kidney disease: Secondary | ICD-10-CM | POA: Diagnosis not present

## 2020-01-17 DIAGNOSIS — Z992 Dependence on renal dialysis: Secondary | ICD-10-CM | POA: Diagnosis not present

## 2020-01-17 DIAGNOSIS — N2581 Secondary hyperparathyroidism of renal origin: Secondary | ICD-10-CM | POA: Diagnosis not present

## 2020-01-17 DIAGNOSIS — E1122 Type 2 diabetes mellitus with diabetic chronic kidney disease: Secondary | ICD-10-CM | POA: Diagnosis not present

## 2020-01-17 DIAGNOSIS — E875 Hyperkalemia: Secondary | ICD-10-CM | POA: Diagnosis not present

## 2020-01-17 DIAGNOSIS — N186 End stage renal disease: Secondary | ICD-10-CM | POA: Diagnosis not present

## 2020-01-20 DIAGNOSIS — E875 Hyperkalemia: Secondary | ICD-10-CM | POA: Diagnosis not present

## 2020-01-20 DIAGNOSIS — D631 Anemia in chronic kidney disease: Secondary | ICD-10-CM | POA: Diagnosis not present

## 2020-01-20 DIAGNOSIS — E1122 Type 2 diabetes mellitus with diabetic chronic kidney disease: Secondary | ICD-10-CM | POA: Diagnosis not present

## 2020-01-20 DIAGNOSIS — N186 End stage renal disease: Secondary | ICD-10-CM | POA: Diagnosis not present

## 2020-01-20 DIAGNOSIS — N2581 Secondary hyperparathyroidism of renal origin: Secondary | ICD-10-CM | POA: Diagnosis not present

## 2020-01-20 DIAGNOSIS — Z992 Dependence on renal dialysis: Secondary | ICD-10-CM | POA: Diagnosis not present

## 2020-01-22 DIAGNOSIS — C775 Secondary and unspecified malignant neoplasm of intrapelvic lymph nodes: Secondary | ICD-10-CM | POA: Diagnosis not present

## 2020-01-22 DIAGNOSIS — D631 Anemia in chronic kidney disease: Secondary | ICD-10-CM | POA: Diagnosis not present

## 2020-01-22 DIAGNOSIS — Z992 Dependence on renal dialysis: Secondary | ICD-10-CM | POA: Diagnosis not present

## 2020-01-22 DIAGNOSIS — E875 Hyperkalemia: Secondary | ICD-10-CM | POA: Diagnosis not present

## 2020-01-22 DIAGNOSIS — C61 Malignant neoplasm of prostate: Secondary | ICD-10-CM | POA: Diagnosis not present

## 2020-01-22 DIAGNOSIS — E1122 Type 2 diabetes mellitus with diabetic chronic kidney disease: Secondary | ICD-10-CM | POA: Diagnosis not present

## 2020-01-22 DIAGNOSIS — N2581 Secondary hyperparathyroidism of renal origin: Secondary | ICD-10-CM | POA: Diagnosis not present

## 2020-01-22 DIAGNOSIS — R232 Flushing: Secondary | ICD-10-CM | POA: Diagnosis not present

## 2020-01-22 DIAGNOSIS — N186 End stage renal disease: Secondary | ICD-10-CM | POA: Diagnosis not present

## 2020-01-24 DIAGNOSIS — N2581 Secondary hyperparathyroidism of renal origin: Secondary | ICD-10-CM | POA: Diagnosis not present

## 2020-01-24 DIAGNOSIS — E1122 Type 2 diabetes mellitus with diabetic chronic kidney disease: Secondary | ICD-10-CM | POA: Diagnosis not present

## 2020-01-24 DIAGNOSIS — N186 End stage renal disease: Secondary | ICD-10-CM | POA: Diagnosis not present

## 2020-01-24 DIAGNOSIS — Z992 Dependence on renal dialysis: Secondary | ICD-10-CM | POA: Diagnosis not present

## 2020-01-24 DIAGNOSIS — D631 Anemia in chronic kidney disease: Secondary | ICD-10-CM | POA: Diagnosis not present

## 2020-01-24 DIAGNOSIS — E875 Hyperkalemia: Secondary | ICD-10-CM | POA: Diagnosis not present

## 2020-01-26 DIAGNOSIS — N186 End stage renal disease: Secondary | ICD-10-CM | POA: Diagnosis not present

## 2020-01-26 DIAGNOSIS — Z992 Dependence on renal dialysis: Secondary | ICD-10-CM | POA: Diagnosis not present

## 2020-01-26 DIAGNOSIS — I129 Hypertensive chronic kidney disease with stage 1 through stage 4 chronic kidney disease, or unspecified chronic kidney disease: Secondary | ICD-10-CM | POA: Diagnosis not present

## 2020-01-27 DIAGNOSIS — N2581 Secondary hyperparathyroidism of renal origin: Secondary | ICD-10-CM | POA: Diagnosis not present

## 2020-01-27 DIAGNOSIS — N186 End stage renal disease: Secondary | ICD-10-CM | POA: Diagnosis not present

## 2020-01-27 DIAGNOSIS — Z992 Dependence on renal dialysis: Secondary | ICD-10-CM | POA: Diagnosis not present

## 2020-01-27 DIAGNOSIS — D509 Iron deficiency anemia, unspecified: Secondary | ICD-10-CM | POA: Diagnosis not present

## 2020-01-27 DIAGNOSIS — E875 Hyperkalemia: Secondary | ICD-10-CM | POA: Diagnosis not present

## 2020-01-27 DIAGNOSIS — E1122 Type 2 diabetes mellitus with diabetic chronic kidney disease: Secondary | ICD-10-CM | POA: Diagnosis not present

## 2020-01-27 DIAGNOSIS — D631 Anemia in chronic kidney disease: Secondary | ICD-10-CM | POA: Diagnosis not present

## 2020-01-29 DIAGNOSIS — Z992 Dependence on renal dialysis: Secondary | ICD-10-CM | POA: Diagnosis not present

## 2020-01-29 DIAGNOSIS — E875 Hyperkalemia: Secondary | ICD-10-CM | POA: Diagnosis not present

## 2020-01-29 DIAGNOSIS — D509 Iron deficiency anemia, unspecified: Secondary | ICD-10-CM | POA: Diagnosis not present

## 2020-01-29 DIAGNOSIS — N2581 Secondary hyperparathyroidism of renal origin: Secondary | ICD-10-CM | POA: Diagnosis not present

## 2020-01-29 DIAGNOSIS — E1122 Type 2 diabetes mellitus with diabetic chronic kidney disease: Secondary | ICD-10-CM | POA: Diagnosis not present

## 2020-01-29 DIAGNOSIS — N186 End stage renal disease: Secondary | ICD-10-CM | POA: Diagnosis not present

## 2020-01-31 DIAGNOSIS — E1122 Type 2 diabetes mellitus with diabetic chronic kidney disease: Secondary | ICD-10-CM | POA: Diagnosis not present

## 2020-01-31 DIAGNOSIS — D509 Iron deficiency anemia, unspecified: Secondary | ICD-10-CM | POA: Diagnosis not present

## 2020-01-31 DIAGNOSIS — N2581 Secondary hyperparathyroidism of renal origin: Secondary | ICD-10-CM | POA: Diagnosis not present

## 2020-01-31 DIAGNOSIS — N186 End stage renal disease: Secondary | ICD-10-CM | POA: Diagnosis not present

## 2020-01-31 DIAGNOSIS — E875 Hyperkalemia: Secondary | ICD-10-CM | POA: Diagnosis not present

## 2020-01-31 DIAGNOSIS — Z992 Dependence on renal dialysis: Secondary | ICD-10-CM | POA: Diagnosis not present

## 2020-02-03 DIAGNOSIS — N186 End stage renal disease: Secondary | ICD-10-CM | POA: Diagnosis not present

## 2020-02-03 DIAGNOSIS — Z992 Dependence on renal dialysis: Secondary | ICD-10-CM | POA: Diagnosis not present

## 2020-02-03 DIAGNOSIS — E1122 Type 2 diabetes mellitus with diabetic chronic kidney disease: Secondary | ICD-10-CM | POA: Diagnosis not present

## 2020-02-03 DIAGNOSIS — D509 Iron deficiency anemia, unspecified: Secondary | ICD-10-CM | POA: Diagnosis not present

## 2020-02-03 DIAGNOSIS — N2581 Secondary hyperparathyroidism of renal origin: Secondary | ICD-10-CM | POA: Diagnosis not present

## 2020-02-03 DIAGNOSIS — E875 Hyperkalemia: Secondary | ICD-10-CM | POA: Diagnosis not present

## 2020-02-05 DIAGNOSIS — Z992 Dependence on renal dialysis: Secondary | ICD-10-CM | POA: Diagnosis not present

## 2020-02-05 DIAGNOSIS — E1122 Type 2 diabetes mellitus with diabetic chronic kidney disease: Secondary | ICD-10-CM | POA: Diagnosis not present

## 2020-02-05 DIAGNOSIS — N186 End stage renal disease: Secondary | ICD-10-CM | POA: Diagnosis not present

## 2020-02-05 DIAGNOSIS — D509 Iron deficiency anemia, unspecified: Secondary | ICD-10-CM | POA: Diagnosis not present

## 2020-02-05 DIAGNOSIS — N2581 Secondary hyperparathyroidism of renal origin: Secondary | ICD-10-CM | POA: Diagnosis not present

## 2020-02-05 DIAGNOSIS — E875 Hyperkalemia: Secondary | ICD-10-CM | POA: Diagnosis not present

## 2020-02-07 DIAGNOSIS — N2581 Secondary hyperparathyroidism of renal origin: Secondary | ICD-10-CM | POA: Diagnosis not present

## 2020-02-07 DIAGNOSIS — N186 End stage renal disease: Secondary | ICD-10-CM | POA: Diagnosis not present

## 2020-02-07 DIAGNOSIS — D509 Iron deficiency anemia, unspecified: Secondary | ICD-10-CM | POA: Diagnosis not present

## 2020-02-07 DIAGNOSIS — Z992 Dependence on renal dialysis: Secondary | ICD-10-CM | POA: Diagnosis not present

## 2020-02-07 DIAGNOSIS — E1122 Type 2 diabetes mellitus with diabetic chronic kidney disease: Secondary | ICD-10-CM | POA: Diagnosis not present

## 2020-02-07 DIAGNOSIS — E875 Hyperkalemia: Secondary | ICD-10-CM | POA: Diagnosis not present

## 2020-02-09 ENCOUNTER — Encounter: Payer: Self-pay | Admitting: Nurse Practitioner

## 2020-02-10 DIAGNOSIS — E875 Hyperkalemia: Secondary | ICD-10-CM | POA: Diagnosis not present

## 2020-02-10 DIAGNOSIS — E1122 Type 2 diabetes mellitus with diabetic chronic kidney disease: Secondary | ICD-10-CM | POA: Diagnosis not present

## 2020-02-10 DIAGNOSIS — N2581 Secondary hyperparathyroidism of renal origin: Secondary | ICD-10-CM | POA: Diagnosis not present

## 2020-02-10 DIAGNOSIS — N186 End stage renal disease: Secondary | ICD-10-CM | POA: Diagnosis not present

## 2020-02-10 DIAGNOSIS — D509 Iron deficiency anemia, unspecified: Secondary | ICD-10-CM | POA: Diagnosis not present

## 2020-02-10 DIAGNOSIS — Z992 Dependence on renal dialysis: Secondary | ICD-10-CM | POA: Diagnosis not present

## 2020-02-12 DIAGNOSIS — N2581 Secondary hyperparathyroidism of renal origin: Secondary | ICD-10-CM | POA: Diagnosis not present

## 2020-02-12 DIAGNOSIS — E1122 Type 2 diabetes mellitus with diabetic chronic kidney disease: Secondary | ICD-10-CM | POA: Diagnosis not present

## 2020-02-12 DIAGNOSIS — N186 End stage renal disease: Secondary | ICD-10-CM | POA: Diagnosis not present

## 2020-02-12 DIAGNOSIS — D509 Iron deficiency anemia, unspecified: Secondary | ICD-10-CM | POA: Diagnosis not present

## 2020-02-12 DIAGNOSIS — E875 Hyperkalemia: Secondary | ICD-10-CM | POA: Diagnosis not present

## 2020-02-12 DIAGNOSIS — Z992 Dependence on renal dialysis: Secondary | ICD-10-CM | POA: Diagnosis not present

## 2020-02-14 DIAGNOSIS — N2581 Secondary hyperparathyroidism of renal origin: Secondary | ICD-10-CM | POA: Diagnosis not present

## 2020-02-14 DIAGNOSIS — Z992 Dependence on renal dialysis: Secondary | ICD-10-CM | POA: Diagnosis not present

## 2020-02-14 DIAGNOSIS — E875 Hyperkalemia: Secondary | ICD-10-CM | POA: Diagnosis not present

## 2020-02-14 DIAGNOSIS — N186 End stage renal disease: Secondary | ICD-10-CM | POA: Diagnosis not present

## 2020-02-14 DIAGNOSIS — E1122 Type 2 diabetes mellitus with diabetic chronic kidney disease: Secondary | ICD-10-CM | POA: Diagnosis not present

## 2020-02-14 DIAGNOSIS — D509 Iron deficiency anemia, unspecified: Secondary | ICD-10-CM | POA: Diagnosis not present

## 2020-02-17 DIAGNOSIS — D509 Iron deficiency anemia, unspecified: Secondary | ICD-10-CM | POA: Diagnosis not present

## 2020-02-17 DIAGNOSIS — N186 End stage renal disease: Secondary | ICD-10-CM | POA: Diagnosis not present

## 2020-02-17 DIAGNOSIS — N2581 Secondary hyperparathyroidism of renal origin: Secondary | ICD-10-CM | POA: Diagnosis not present

## 2020-02-17 DIAGNOSIS — Z992 Dependence on renal dialysis: Secondary | ICD-10-CM | POA: Diagnosis not present

## 2020-02-17 DIAGNOSIS — E1122 Type 2 diabetes mellitus with diabetic chronic kidney disease: Secondary | ICD-10-CM | POA: Diagnosis not present

## 2020-02-17 DIAGNOSIS — E875 Hyperkalemia: Secondary | ICD-10-CM | POA: Diagnosis not present

## 2020-02-18 ENCOUNTER — Ambulatory Visit: Payer: Medicare Other | Admitting: Nurse Practitioner

## 2020-02-19 DIAGNOSIS — N2581 Secondary hyperparathyroidism of renal origin: Secondary | ICD-10-CM | POA: Diagnosis not present

## 2020-02-19 DIAGNOSIS — Z992 Dependence on renal dialysis: Secondary | ICD-10-CM | POA: Diagnosis not present

## 2020-02-19 DIAGNOSIS — D509 Iron deficiency anemia, unspecified: Secondary | ICD-10-CM | POA: Diagnosis not present

## 2020-02-19 DIAGNOSIS — E1122 Type 2 diabetes mellitus with diabetic chronic kidney disease: Secondary | ICD-10-CM | POA: Diagnosis not present

## 2020-02-19 DIAGNOSIS — E875 Hyperkalemia: Secondary | ICD-10-CM | POA: Diagnosis not present

## 2020-02-19 DIAGNOSIS — N186 End stage renal disease: Secondary | ICD-10-CM | POA: Diagnosis not present

## 2020-02-21 DIAGNOSIS — N2581 Secondary hyperparathyroidism of renal origin: Secondary | ICD-10-CM | POA: Diagnosis not present

## 2020-02-21 DIAGNOSIS — D509 Iron deficiency anemia, unspecified: Secondary | ICD-10-CM | POA: Diagnosis not present

## 2020-02-21 DIAGNOSIS — E875 Hyperkalemia: Secondary | ICD-10-CM | POA: Diagnosis not present

## 2020-02-21 DIAGNOSIS — N186 End stage renal disease: Secondary | ICD-10-CM | POA: Diagnosis not present

## 2020-02-21 DIAGNOSIS — Z992 Dependence on renal dialysis: Secondary | ICD-10-CM | POA: Diagnosis not present

## 2020-02-21 DIAGNOSIS — E1122 Type 2 diabetes mellitus with diabetic chronic kidney disease: Secondary | ICD-10-CM | POA: Diagnosis not present

## 2020-02-24 DIAGNOSIS — N2581 Secondary hyperparathyroidism of renal origin: Secondary | ICD-10-CM | POA: Diagnosis not present

## 2020-02-24 DIAGNOSIS — E875 Hyperkalemia: Secondary | ICD-10-CM | POA: Diagnosis not present

## 2020-02-24 DIAGNOSIS — Z992 Dependence on renal dialysis: Secondary | ICD-10-CM | POA: Diagnosis not present

## 2020-02-24 DIAGNOSIS — E1122 Type 2 diabetes mellitus with diabetic chronic kidney disease: Secondary | ICD-10-CM | POA: Diagnosis not present

## 2020-02-24 DIAGNOSIS — N186 End stage renal disease: Secondary | ICD-10-CM | POA: Diagnosis not present

## 2020-02-24 DIAGNOSIS — D509 Iron deficiency anemia, unspecified: Secondary | ICD-10-CM | POA: Diagnosis not present

## 2020-02-26 DIAGNOSIS — N2581 Secondary hyperparathyroidism of renal origin: Secondary | ICD-10-CM | POA: Diagnosis not present

## 2020-02-26 DIAGNOSIS — E875 Hyperkalemia: Secondary | ICD-10-CM | POA: Diagnosis not present

## 2020-02-26 DIAGNOSIS — D509 Iron deficiency anemia, unspecified: Secondary | ICD-10-CM | POA: Diagnosis not present

## 2020-02-26 DIAGNOSIS — D631 Anemia in chronic kidney disease: Secondary | ICD-10-CM | POA: Diagnosis not present

## 2020-02-26 DIAGNOSIS — E1122 Type 2 diabetes mellitus with diabetic chronic kidney disease: Secondary | ICD-10-CM | POA: Diagnosis not present

## 2020-02-26 DIAGNOSIS — I129 Hypertensive chronic kidney disease with stage 1 through stage 4 chronic kidney disease, or unspecified chronic kidney disease: Secondary | ICD-10-CM | POA: Diagnosis not present

## 2020-02-26 DIAGNOSIS — N186 End stage renal disease: Secondary | ICD-10-CM | POA: Diagnosis not present

## 2020-02-26 DIAGNOSIS — Z992 Dependence on renal dialysis: Secondary | ICD-10-CM | POA: Diagnosis not present

## 2020-02-28 DIAGNOSIS — E1122 Type 2 diabetes mellitus with diabetic chronic kidney disease: Secondary | ICD-10-CM | POA: Diagnosis not present

## 2020-02-28 DIAGNOSIS — E875 Hyperkalemia: Secondary | ICD-10-CM | POA: Diagnosis not present

## 2020-02-28 DIAGNOSIS — Z992 Dependence on renal dialysis: Secondary | ICD-10-CM | POA: Diagnosis not present

## 2020-02-28 DIAGNOSIS — N2581 Secondary hyperparathyroidism of renal origin: Secondary | ICD-10-CM | POA: Diagnosis not present

## 2020-02-28 DIAGNOSIS — N186 End stage renal disease: Secondary | ICD-10-CM | POA: Diagnosis not present

## 2020-02-28 DIAGNOSIS — D509 Iron deficiency anemia, unspecified: Secondary | ICD-10-CM | POA: Diagnosis not present

## 2020-03-02 DIAGNOSIS — E875 Hyperkalemia: Secondary | ICD-10-CM | POA: Diagnosis not present

## 2020-03-02 DIAGNOSIS — N186 End stage renal disease: Secondary | ICD-10-CM | POA: Diagnosis not present

## 2020-03-02 DIAGNOSIS — Z992 Dependence on renal dialysis: Secondary | ICD-10-CM | POA: Diagnosis not present

## 2020-03-02 DIAGNOSIS — D509 Iron deficiency anemia, unspecified: Secondary | ICD-10-CM | POA: Diagnosis not present

## 2020-03-02 DIAGNOSIS — N2581 Secondary hyperparathyroidism of renal origin: Secondary | ICD-10-CM | POA: Diagnosis not present

## 2020-03-02 DIAGNOSIS — E1122 Type 2 diabetes mellitus with diabetic chronic kidney disease: Secondary | ICD-10-CM | POA: Diagnosis not present

## 2020-03-04 DIAGNOSIS — D509 Iron deficiency anemia, unspecified: Secondary | ICD-10-CM | POA: Diagnosis not present

## 2020-03-04 DIAGNOSIS — N186 End stage renal disease: Secondary | ICD-10-CM | POA: Diagnosis not present

## 2020-03-04 DIAGNOSIS — E875 Hyperkalemia: Secondary | ICD-10-CM | POA: Diagnosis not present

## 2020-03-04 DIAGNOSIS — N2581 Secondary hyperparathyroidism of renal origin: Secondary | ICD-10-CM | POA: Diagnosis not present

## 2020-03-04 DIAGNOSIS — E1122 Type 2 diabetes mellitus with diabetic chronic kidney disease: Secondary | ICD-10-CM | POA: Diagnosis not present

## 2020-03-04 DIAGNOSIS — Z992 Dependence on renal dialysis: Secondary | ICD-10-CM | POA: Diagnosis not present

## 2020-03-06 DIAGNOSIS — E875 Hyperkalemia: Secondary | ICD-10-CM | POA: Diagnosis not present

## 2020-03-06 DIAGNOSIS — N186 End stage renal disease: Secondary | ICD-10-CM | POA: Diagnosis not present

## 2020-03-06 DIAGNOSIS — Z992 Dependence on renal dialysis: Secondary | ICD-10-CM | POA: Diagnosis not present

## 2020-03-06 DIAGNOSIS — N2581 Secondary hyperparathyroidism of renal origin: Secondary | ICD-10-CM | POA: Diagnosis not present

## 2020-03-06 DIAGNOSIS — D509 Iron deficiency anemia, unspecified: Secondary | ICD-10-CM | POA: Diagnosis not present

## 2020-03-06 DIAGNOSIS — E1122 Type 2 diabetes mellitus with diabetic chronic kidney disease: Secondary | ICD-10-CM | POA: Diagnosis not present

## 2020-03-09 DIAGNOSIS — Z992 Dependence on renal dialysis: Secondary | ICD-10-CM | POA: Diagnosis not present

## 2020-03-09 DIAGNOSIS — D509 Iron deficiency anemia, unspecified: Secondary | ICD-10-CM | POA: Diagnosis not present

## 2020-03-09 DIAGNOSIS — N186 End stage renal disease: Secondary | ICD-10-CM | POA: Diagnosis not present

## 2020-03-09 DIAGNOSIS — E875 Hyperkalemia: Secondary | ICD-10-CM | POA: Diagnosis not present

## 2020-03-09 DIAGNOSIS — E1122 Type 2 diabetes mellitus with diabetic chronic kidney disease: Secondary | ICD-10-CM | POA: Diagnosis not present

## 2020-03-09 DIAGNOSIS — N2581 Secondary hyperparathyroidism of renal origin: Secondary | ICD-10-CM | POA: Diagnosis not present

## 2020-03-11 DIAGNOSIS — N186 End stage renal disease: Secondary | ICD-10-CM | POA: Diagnosis not present

## 2020-03-11 DIAGNOSIS — D509 Iron deficiency anemia, unspecified: Secondary | ICD-10-CM | POA: Diagnosis not present

## 2020-03-11 DIAGNOSIS — N2581 Secondary hyperparathyroidism of renal origin: Secondary | ICD-10-CM | POA: Diagnosis not present

## 2020-03-11 DIAGNOSIS — E1122 Type 2 diabetes mellitus with diabetic chronic kidney disease: Secondary | ICD-10-CM | POA: Diagnosis not present

## 2020-03-11 DIAGNOSIS — Z992 Dependence on renal dialysis: Secondary | ICD-10-CM | POA: Diagnosis not present

## 2020-03-11 DIAGNOSIS — E875 Hyperkalemia: Secondary | ICD-10-CM | POA: Diagnosis not present

## 2020-03-13 DIAGNOSIS — Z992 Dependence on renal dialysis: Secondary | ICD-10-CM | POA: Diagnosis not present

## 2020-03-13 DIAGNOSIS — E875 Hyperkalemia: Secondary | ICD-10-CM | POA: Diagnosis not present

## 2020-03-13 DIAGNOSIS — N2581 Secondary hyperparathyroidism of renal origin: Secondary | ICD-10-CM | POA: Diagnosis not present

## 2020-03-13 DIAGNOSIS — D509 Iron deficiency anemia, unspecified: Secondary | ICD-10-CM | POA: Diagnosis not present

## 2020-03-13 DIAGNOSIS — E1122 Type 2 diabetes mellitus with diabetic chronic kidney disease: Secondary | ICD-10-CM | POA: Diagnosis not present

## 2020-03-13 DIAGNOSIS — N186 End stage renal disease: Secondary | ICD-10-CM | POA: Diagnosis not present

## 2020-03-16 DIAGNOSIS — E1122 Type 2 diabetes mellitus with diabetic chronic kidney disease: Secondary | ICD-10-CM | POA: Diagnosis not present

## 2020-03-16 DIAGNOSIS — N2581 Secondary hyperparathyroidism of renal origin: Secondary | ICD-10-CM | POA: Diagnosis not present

## 2020-03-16 DIAGNOSIS — D509 Iron deficiency anemia, unspecified: Secondary | ICD-10-CM | POA: Diagnosis not present

## 2020-03-16 DIAGNOSIS — E875 Hyperkalemia: Secondary | ICD-10-CM | POA: Diagnosis not present

## 2020-03-16 DIAGNOSIS — N186 End stage renal disease: Secondary | ICD-10-CM | POA: Diagnosis not present

## 2020-03-16 DIAGNOSIS — Z992 Dependence on renal dialysis: Secondary | ICD-10-CM | POA: Diagnosis not present

## 2020-03-18 DIAGNOSIS — N186 End stage renal disease: Secondary | ICD-10-CM | POA: Diagnosis not present

## 2020-03-18 DIAGNOSIS — D509 Iron deficiency anemia, unspecified: Secondary | ICD-10-CM | POA: Diagnosis not present

## 2020-03-18 DIAGNOSIS — E875 Hyperkalemia: Secondary | ICD-10-CM | POA: Diagnosis not present

## 2020-03-18 DIAGNOSIS — Z992 Dependence on renal dialysis: Secondary | ICD-10-CM | POA: Diagnosis not present

## 2020-03-18 DIAGNOSIS — N2581 Secondary hyperparathyroidism of renal origin: Secondary | ICD-10-CM | POA: Diagnosis not present

## 2020-03-18 DIAGNOSIS — E1122 Type 2 diabetes mellitus with diabetic chronic kidney disease: Secondary | ICD-10-CM | POA: Diagnosis not present

## 2020-03-20 DIAGNOSIS — N186 End stage renal disease: Secondary | ICD-10-CM | POA: Diagnosis not present

## 2020-03-20 DIAGNOSIS — D509 Iron deficiency anemia, unspecified: Secondary | ICD-10-CM | POA: Diagnosis not present

## 2020-03-20 DIAGNOSIS — E1122 Type 2 diabetes mellitus with diabetic chronic kidney disease: Secondary | ICD-10-CM | POA: Diagnosis not present

## 2020-03-20 DIAGNOSIS — N2581 Secondary hyperparathyroidism of renal origin: Secondary | ICD-10-CM | POA: Diagnosis not present

## 2020-03-20 DIAGNOSIS — E875 Hyperkalemia: Secondary | ICD-10-CM | POA: Diagnosis not present

## 2020-03-20 DIAGNOSIS — Z992 Dependence on renal dialysis: Secondary | ICD-10-CM | POA: Diagnosis not present

## 2020-03-23 DIAGNOSIS — N186 End stage renal disease: Secondary | ICD-10-CM | POA: Diagnosis not present

## 2020-03-23 DIAGNOSIS — E875 Hyperkalemia: Secondary | ICD-10-CM | POA: Diagnosis not present

## 2020-03-23 DIAGNOSIS — N2581 Secondary hyperparathyroidism of renal origin: Secondary | ICD-10-CM | POA: Diagnosis not present

## 2020-03-23 DIAGNOSIS — Z992 Dependence on renal dialysis: Secondary | ICD-10-CM | POA: Diagnosis not present

## 2020-03-23 DIAGNOSIS — E1122 Type 2 diabetes mellitus with diabetic chronic kidney disease: Secondary | ICD-10-CM | POA: Diagnosis not present

## 2020-03-23 DIAGNOSIS — D509 Iron deficiency anemia, unspecified: Secondary | ICD-10-CM | POA: Diagnosis not present

## 2020-03-25 DIAGNOSIS — N186 End stage renal disease: Secondary | ICD-10-CM | POA: Diagnosis not present

## 2020-03-25 DIAGNOSIS — E875 Hyperkalemia: Secondary | ICD-10-CM | POA: Diagnosis not present

## 2020-03-25 DIAGNOSIS — Z992 Dependence on renal dialysis: Secondary | ICD-10-CM | POA: Diagnosis not present

## 2020-03-25 DIAGNOSIS — N2581 Secondary hyperparathyroidism of renal origin: Secondary | ICD-10-CM | POA: Diagnosis not present

## 2020-03-25 DIAGNOSIS — E1122 Type 2 diabetes mellitus with diabetic chronic kidney disease: Secondary | ICD-10-CM | POA: Diagnosis not present

## 2020-03-25 DIAGNOSIS — D509 Iron deficiency anemia, unspecified: Secondary | ICD-10-CM | POA: Diagnosis not present

## 2020-03-27 DIAGNOSIS — N186 End stage renal disease: Secondary | ICD-10-CM | POA: Diagnosis not present

## 2020-03-27 DIAGNOSIS — I129 Hypertensive chronic kidney disease with stage 1 through stage 4 chronic kidney disease, or unspecified chronic kidney disease: Secondary | ICD-10-CM | POA: Diagnosis not present

## 2020-03-27 DIAGNOSIS — E1122 Type 2 diabetes mellitus with diabetic chronic kidney disease: Secondary | ICD-10-CM | POA: Diagnosis not present

## 2020-03-27 DIAGNOSIS — N2581 Secondary hyperparathyroidism of renal origin: Secondary | ICD-10-CM | POA: Diagnosis not present

## 2020-03-27 DIAGNOSIS — D509 Iron deficiency anemia, unspecified: Secondary | ICD-10-CM | POA: Diagnosis not present

## 2020-03-27 DIAGNOSIS — Z992 Dependence on renal dialysis: Secondary | ICD-10-CM | POA: Diagnosis not present

## 2020-03-27 DIAGNOSIS — D631 Anemia in chronic kidney disease: Secondary | ICD-10-CM | POA: Diagnosis not present

## 2020-03-27 DIAGNOSIS — E875 Hyperkalemia: Secondary | ICD-10-CM | POA: Diagnosis not present

## 2020-03-30 DIAGNOSIS — E875 Hyperkalemia: Secondary | ICD-10-CM | POA: Diagnosis not present

## 2020-03-30 DIAGNOSIS — N2581 Secondary hyperparathyroidism of renal origin: Secondary | ICD-10-CM | POA: Diagnosis not present

## 2020-03-30 DIAGNOSIS — Z992 Dependence on renal dialysis: Secondary | ICD-10-CM | POA: Diagnosis not present

## 2020-03-30 DIAGNOSIS — E1122 Type 2 diabetes mellitus with diabetic chronic kidney disease: Secondary | ICD-10-CM | POA: Diagnosis not present

## 2020-03-30 DIAGNOSIS — N186 End stage renal disease: Secondary | ICD-10-CM | POA: Diagnosis not present

## 2020-03-30 DIAGNOSIS — D509 Iron deficiency anemia, unspecified: Secondary | ICD-10-CM | POA: Diagnosis not present

## 2020-04-01 DIAGNOSIS — N2581 Secondary hyperparathyroidism of renal origin: Secondary | ICD-10-CM | POA: Diagnosis not present

## 2020-04-01 DIAGNOSIS — Z992 Dependence on renal dialysis: Secondary | ICD-10-CM | POA: Diagnosis not present

## 2020-04-01 DIAGNOSIS — N186 End stage renal disease: Secondary | ICD-10-CM | POA: Diagnosis not present

## 2020-04-01 DIAGNOSIS — E875 Hyperkalemia: Secondary | ICD-10-CM | POA: Diagnosis not present

## 2020-04-01 DIAGNOSIS — E1122 Type 2 diabetes mellitus with diabetic chronic kidney disease: Secondary | ICD-10-CM | POA: Diagnosis not present

## 2020-04-01 DIAGNOSIS — D509 Iron deficiency anemia, unspecified: Secondary | ICD-10-CM | POA: Diagnosis not present

## 2020-04-03 DIAGNOSIS — D509 Iron deficiency anemia, unspecified: Secondary | ICD-10-CM | POA: Diagnosis not present

## 2020-04-03 DIAGNOSIS — Z992 Dependence on renal dialysis: Secondary | ICD-10-CM | POA: Diagnosis not present

## 2020-04-03 DIAGNOSIS — E1122 Type 2 diabetes mellitus with diabetic chronic kidney disease: Secondary | ICD-10-CM | POA: Diagnosis not present

## 2020-04-03 DIAGNOSIS — N2581 Secondary hyperparathyroidism of renal origin: Secondary | ICD-10-CM | POA: Diagnosis not present

## 2020-04-03 DIAGNOSIS — N186 End stage renal disease: Secondary | ICD-10-CM | POA: Diagnosis not present

## 2020-04-03 DIAGNOSIS — E875 Hyperkalemia: Secondary | ICD-10-CM | POA: Diagnosis not present

## 2020-04-06 DIAGNOSIS — Z992 Dependence on renal dialysis: Secondary | ICD-10-CM | POA: Diagnosis not present

## 2020-04-06 DIAGNOSIS — N186 End stage renal disease: Secondary | ICD-10-CM | POA: Diagnosis not present

## 2020-04-06 DIAGNOSIS — N2581 Secondary hyperparathyroidism of renal origin: Secondary | ICD-10-CM | POA: Diagnosis not present

## 2020-04-06 DIAGNOSIS — E875 Hyperkalemia: Secondary | ICD-10-CM | POA: Diagnosis not present

## 2020-04-06 DIAGNOSIS — E1122 Type 2 diabetes mellitus with diabetic chronic kidney disease: Secondary | ICD-10-CM | POA: Diagnosis not present

## 2020-04-06 DIAGNOSIS — D509 Iron deficiency anemia, unspecified: Secondary | ICD-10-CM | POA: Diagnosis not present

## 2020-04-08 DIAGNOSIS — N2581 Secondary hyperparathyroidism of renal origin: Secondary | ICD-10-CM | POA: Diagnosis not present

## 2020-04-08 DIAGNOSIS — D509 Iron deficiency anemia, unspecified: Secondary | ICD-10-CM | POA: Diagnosis not present

## 2020-04-08 DIAGNOSIS — N186 End stage renal disease: Secondary | ICD-10-CM | POA: Diagnosis not present

## 2020-04-08 DIAGNOSIS — Z992 Dependence on renal dialysis: Secondary | ICD-10-CM | POA: Diagnosis not present

## 2020-04-08 DIAGNOSIS — E1122 Type 2 diabetes mellitus with diabetic chronic kidney disease: Secondary | ICD-10-CM | POA: Diagnosis not present

## 2020-04-08 DIAGNOSIS — E875 Hyperkalemia: Secondary | ICD-10-CM | POA: Diagnosis not present

## 2020-04-10 DIAGNOSIS — E1122 Type 2 diabetes mellitus with diabetic chronic kidney disease: Secondary | ICD-10-CM | POA: Diagnosis not present

## 2020-04-10 DIAGNOSIS — Z992 Dependence on renal dialysis: Secondary | ICD-10-CM | POA: Diagnosis not present

## 2020-04-10 DIAGNOSIS — N2581 Secondary hyperparathyroidism of renal origin: Secondary | ICD-10-CM | POA: Diagnosis not present

## 2020-04-10 DIAGNOSIS — D509 Iron deficiency anemia, unspecified: Secondary | ICD-10-CM | POA: Diagnosis not present

## 2020-04-10 DIAGNOSIS — E875 Hyperkalemia: Secondary | ICD-10-CM | POA: Diagnosis not present

## 2020-04-10 DIAGNOSIS — N186 End stage renal disease: Secondary | ICD-10-CM | POA: Diagnosis not present

## 2020-04-13 DIAGNOSIS — E1122 Type 2 diabetes mellitus with diabetic chronic kidney disease: Secondary | ICD-10-CM | POA: Diagnosis not present

## 2020-04-13 DIAGNOSIS — D509 Iron deficiency anemia, unspecified: Secondary | ICD-10-CM | POA: Diagnosis not present

## 2020-04-13 DIAGNOSIS — E875 Hyperkalemia: Secondary | ICD-10-CM | POA: Diagnosis not present

## 2020-04-13 DIAGNOSIS — N2581 Secondary hyperparathyroidism of renal origin: Secondary | ICD-10-CM | POA: Diagnosis not present

## 2020-04-13 DIAGNOSIS — N186 End stage renal disease: Secondary | ICD-10-CM | POA: Diagnosis not present

## 2020-04-13 DIAGNOSIS — Z992 Dependence on renal dialysis: Secondary | ICD-10-CM | POA: Diagnosis not present

## 2020-04-15 DIAGNOSIS — E875 Hyperkalemia: Secondary | ICD-10-CM | POA: Diagnosis not present

## 2020-04-15 DIAGNOSIS — Z992 Dependence on renal dialysis: Secondary | ICD-10-CM | POA: Diagnosis not present

## 2020-04-15 DIAGNOSIS — N186 End stage renal disease: Secondary | ICD-10-CM | POA: Diagnosis not present

## 2020-04-15 DIAGNOSIS — E1122 Type 2 diabetes mellitus with diabetic chronic kidney disease: Secondary | ICD-10-CM | POA: Diagnosis not present

## 2020-04-15 DIAGNOSIS — N2581 Secondary hyperparathyroidism of renal origin: Secondary | ICD-10-CM | POA: Diagnosis not present

## 2020-04-15 DIAGNOSIS — D509 Iron deficiency anemia, unspecified: Secondary | ICD-10-CM | POA: Diagnosis not present

## 2020-04-17 DIAGNOSIS — N2581 Secondary hyperparathyroidism of renal origin: Secondary | ICD-10-CM | POA: Diagnosis not present

## 2020-04-17 DIAGNOSIS — Z992 Dependence on renal dialysis: Secondary | ICD-10-CM | POA: Diagnosis not present

## 2020-04-17 DIAGNOSIS — E1122 Type 2 diabetes mellitus with diabetic chronic kidney disease: Secondary | ICD-10-CM | POA: Diagnosis not present

## 2020-04-17 DIAGNOSIS — N186 End stage renal disease: Secondary | ICD-10-CM | POA: Diagnosis not present

## 2020-04-17 DIAGNOSIS — D509 Iron deficiency anemia, unspecified: Secondary | ICD-10-CM | POA: Diagnosis not present

## 2020-04-17 DIAGNOSIS — E875 Hyperkalemia: Secondary | ICD-10-CM | POA: Diagnosis not present

## 2020-04-20 DIAGNOSIS — E875 Hyperkalemia: Secondary | ICD-10-CM | POA: Diagnosis not present

## 2020-04-20 DIAGNOSIS — N2581 Secondary hyperparathyroidism of renal origin: Secondary | ICD-10-CM | POA: Diagnosis not present

## 2020-04-20 DIAGNOSIS — E1122 Type 2 diabetes mellitus with diabetic chronic kidney disease: Secondary | ICD-10-CM | POA: Diagnosis not present

## 2020-04-20 DIAGNOSIS — N186 End stage renal disease: Secondary | ICD-10-CM | POA: Diagnosis not present

## 2020-04-20 DIAGNOSIS — D509 Iron deficiency anemia, unspecified: Secondary | ICD-10-CM | POA: Diagnosis not present

## 2020-04-20 DIAGNOSIS — Z992 Dependence on renal dialysis: Secondary | ICD-10-CM | POA: Diagnosis not present

## 2020-04-22 DIAGNOSIS — E875 Hyperkalemia: Secondary | ICD-10-CM | POA: Diagnosis not present

## 2020-04-22 DIAGNOSIS — N186 End stage renal disease: Secondary | ICD-10-CM | POA: Diagnosis not present

## 2020-04-22 DIAGNOSIS — D509 Iron deficiency anemia, unspecified: Secondary | ICD-10-CM | POA: Diagnosis not present

## 2020-04-22 DIAGNOSIS — Z992 Dependence on renal dialysis: Secondary | ICD-10-CM | POA: Diagnosis not present

## 2020-04-22 DIAGNOSIS — N2581 Secondary hyperparathyroidism of renal origin: Secondary | ICD-10-CM | POA: Diagnosis not present

## 2020-04-22 DIAGNOSIS — E1122 Type 2 diabetes mellitus with diabetic chronic kidney disease: Secondary | ICD-10-CM | POA: Diagnosis not present

## 2020-04-24 DIAGNOSIS — D509 Iron deficiency anemia, unspecified: Secondary | ICD-10-CM | POA: Diagnosis not present

## 2020-04-24 DIAGNOSIS — Z992 Dependence on renal dialysis: Secondary | ICD-10-CM | POA: Diagnosis not present

## 2020-04-24 DIAGNOSIS — N186 End stage renal disease: Secondary | ICD-10-CM | POA: Diagnosis not present

## 2020-04-24 DIAGNOSIS — E1122 Type 2 diabetes mellitus with diabetic chronic kidney disease: Secondary | ICD-10-CM | POA: Diagnosis not present

## 2020-04-24 DIAGNOSIS — E875 Hyperkalemia: Secondary | ICD-10-CM | POA: Diagnosis not present

## 2020-04-24 DIAGNOSIS — N2581 Secondary hyperparathyroidism of renal origin: Secondary | ICD-10-CM | POA: Diagnosis not present

## 2020-04-27 ENCOUNTER — Inpatient Hospital Stay (HOSPITAL_COMMUNITY): Payer: Medicare Other

## 2020-04-27 ENCOUNTER — Encounter (HOSPITAL_COMMUNITY): Payer: Self-pay

## 2020-04-27 ENCOUNTER — Inpatient Hospital Stay (HOSPITAL_COMMUNITY)
Admission: EM | Admit: 2020-04-27 | Discharge: 2020-05-27 | DRG: 207 | Disposition: E | Payer: Medicare Other | Attending: Pulmonary Disease | Admitting: Pulmonary Disease

## 2020-04-27 ENCOUNTER — Emergency Department (HOSPITAL_COMMUNITY): Payer: Medicare Other

## 2020-04-27 DIAGNOSIS — D509 Iron deficiency anemia, unspecified: Secondary | ICD-10-CM | POA: Diagnosis present

## 2020-04-27 DIAGNOSIS — E7220 Disorder of urea cycle metabolism, unspecified: Secondary | ICD-10-CM | POA: Diagnosis present

## 2020-04-27 DIAGNOSIS — R569 Unspecified convulsions: Secondary | ICD-10-CM | POA: Diagnosis not present

## 2020-04-27 DIAGNOSIS — G9340 Encephalopathy, unspecified: Secondary | ICD-10-CM | POA: Diagnosis not present

## 2020-04-27 DIAGNOSIS — Z20822 Contact with and (suspected) exposure to covid-19: Secondary | ICD-10-CM | POA: Diagnosis present

## 2020-04-27 DIAGNOSIS — Z880 Allergy status to penicillin: Secondary | ICD-10-CM

## 2020-04-27 DIAGNOSIS — Z9911 Dependence on respirator [ventilator] status: Secondary | ICD-10-CM

## 2020-04-27 DIAGNOSIS — D61818 Other pancytopenia: Secondary | ICD-10-CM | POA: Diagnosis present

## 2020-04-27 DIAGNOSIS — Z794 Long term (current) use of insulin: Secondary | ICD-10-CM

## 2020-04-27 DIAGNOSIS — N186 End stage renal disease: Secondary | ICD-10-CM

## 2020-04-27 DIAGNOSIS — G40801 Other epilepsy, not intractable, with status epilepticus: Secondary | ICD-10-CM | POA: Diagnosis present

## 2020-04-27 DIAGNOSIS — J9811 Atelectasis: Secondary | ICD-10-CM | POA: Diagnosis not present

## 2020-04-27 DIAGNOSIS — Z9111 Patient's noncompliance with dietary regimen: Secondary | ICD-10-CM

## 2020-04-27 DIAGNOSIS — K219 Gastro-esophageal reflux disease without esophagitis: Secondary | ICD-10-CM | POA: Diagnosis present

## 2020-04-27 DIAGNOSIS — N189 Chronic kidney disease, unspecified: Secondary | ICD-10-CM | POA: Diagnosis present

## 2020-04-27 DIAGNOSIS — E875 Hyperkalemia: Secondary | ICD-10-CM

## 2020-04-27 DIAGNOSIS — Z978 Presence of other specified devices: Secondary | ICD-10-CM | POA: Diagnosis not present

## 2020-04-27 DIAGNOSIS — G40901 Epilepsy, unspecified, not intractable, with status epilepticus: Secondary | ICD-10-CM | POA: Diagnosis not present

## 2020-04-27 DIAGNOSIS — E8770 Fluid overload, unspecified: Secondary | ICD-10-CM | POA: Diagnosis not present

## 2020-04-27 DIAGNOSIS — J9690 Respiratory failure, unspecified, unspecified whether with hypoxia or hypercapnia: Secondary | ICD-10-CM

## 2020-04-27 DIAGNOSIS — K567 Ileus, unspecified: Secondary | ICD-10-CM

## 2020-04-27 DIAGNOSIS — R579 Shock, unspecified: Secondary | ICD-10-CM

## 2020-04-27 DIAGNOSIS — Z8546 Personal history of malignant neoplasm of prostate: Secondary | ICD-10-CM

## 2020-04-27 DIAGNOSIS — E8779 Other fluid overload: Secondary | ICD-10-CM

## 2020-04-27 DIAGNOSIS — E1122 Type 2 diabetes mellitus with diabetic chronic kidney disease: Secondary | ICD-10-CM | POA: Diagnosis present

## 2020-04-27 DIAGNOSIS — J690 Pneumonitis due to inhalation of food and vomit: Secondary | ICD-10-CM | POA: Diagnosis not present

## 2020-04-27 DIAGNOSIS — N179 Acute kidney failure, unspecified: Secondary | ICD-10-CM | POA: Diagnosis present

## 2020-04-27 DIAGNOSIS — D631 Anemia in chronic kidney disease: Secondary | ICD-10-CM | POA: Diagnosis present

## 2020-04-27 DIAGNOSIS — N1831 Chronic kidney disease, stage 3a: Secondary | ICD-10-CM | POA: Diagnosis not present

## 2020-04-27 DIAGNOSIS — E1165 Type 2 diabetes mellitus with hyperglycemia: Secondary | ICD-10-CM | POA: Diagnosis not present

## 2020-04-27 DIAGNOSIS — R17 Unspecified jaundice: Secondary | ICD-10-CM | POA: Diagnosis present

## 2020-04-27 DIAGNOSIS — J810 Acute pulmonary edema: Secondary | ICD-10-CM | POA: Diagnosis not present

## 2020-04-27 DIAGNOSIS — J9601 Acute respiratory failure with hypoxia: Secondary | ICD-10-CM | POA: Diagnosis present

## 2020-04-27 DIAGNOSIS — G9341 Metabolic encephalopathy: Secondary | ICD-10-CM | POA: Diagnosis present

## 2020-04-27 DIAGNOSIS — R4020 Unspecified coma: Secondary | ICD-10-CM | POA: Diagnosis not present

## 2020-04-27 DIAGNOSIS — K5900 Constipation, unspecified: Secondary | ICD-10-CM | POA: Diagnosis present

## 2020-04-27 DIAGNOSIS — E872 Acidosis: Secondary | ICD-10-CM | POA: Diagnosis present

## 2020-04-27 DIAGNOSIS — Z452 Encounter for adjustment and management of vascular access device: Secondary | ICD-10-CM | POA: Diagnosis not present

## 2020-04-27 DIAGNOSIS — E11649 Type 2 diabetes mellitus with hypoglycemia without coma: Secondary | ICD-10-CM | POA: Diagnosis not present

## 2020-04-27 DIAGNOSIS — I120 Hypertensive chronic kidney disease with stage 5 chronic kidney disease or end stage renal disease: Secondary | ICD-10-CM | POA: Diagnosis present

## 2020-04-27 DIAGNOSIS — I161 Hypertensive emergency: Secondary | ICD-10-CM | POA: Diagnosis present

## 2020-04-27 DIAGNOSIS — R400 Somnolence: Secondary | ICD-10-CM

## 2020-04-27 DIAGNOSIS — Z4682 Encounter for fitting and adjustment of non-vascular catheter: Secondary | ICD-10-CM | POA: Diagnosis not present

## 2020-04-27 DIAGNOSIS — Z992 Dependence on renal dialysis: Secondary | ICD-10-CM | POA: Diagnosis not present

## 2020-04-27 DIAGNOSIS — I952 Hypotension due to drugs: Secondary | ICD-10-CM | POA: Diagnosis not present

## 2020-04-27 DIAGNOSIS — G9389 Other specified disorders of brain: Secondary | ICD-10-CM | POA: Diagnosis not present

## 2020-04-27 DIAGNOSIS — E8809 Other disorders of plasma-protein metabolism, not elsewhere classified: Secondary | ICD-10-CM | POA: Diagnosis present

## 2020-04-27 DIAGNOSIS — R52 Pain, unspecified: Secondary | ICD-10-CM | POA: Diagnosis not present

## 2020-04-27 DIAGNOSIS — R0902 Hypoxemia: Secondary | ICD-10-CM

## 2020-04-27 DIAGNOSIS — Z66 Do not resuscitate: Secondary | ICD-10-CM | POA: Diagnosis not present

## 2020-04-27 DIAGNOSIS — N2581 Secondary hyperparathyroidism of renal origin: Secondary | ICD-10-CM | POA: Diagnosis not present

## 2020-04-27 DIAGNOSIS — N170 Acute kidney failure with tubular necrosis: Secondary | ICD-10-CM | POA: Diagnosis not present

## 2020-04-27 DIAGNOSIS — J189 Pneumonia, unspecified organism: Secondary | ICD-10-CM | POA: Diagnosis not present

## 2020-04-27 DIAGNOSIS — Z79899 Other long term (current) drug therapy: Secondary | ICD-10-CM

## 2020-04-27 DIAGNOSIS — E1129 Type 2 diabetes mellitus with other diabetic kidney complication: Secondary | ICD-10-CM | POA: Diagnosis not present

## 2020-04-27 DIAGNOSIS — Z515 Encounter for palliative care: Secondary | ICD-10-CM | POA: Diagnosis not present

## 2020-04-27 DIAGNOSIS — Z8673 Personal history of transient ischemic attack (TIA), and cerebral infarction without residual deficits: Secondary | ICD-10-CM

## 2020-04-27 DIAGNOSIS — J9600 Acute respiratory failure, unspecified whether with hypoxia or hypercapnia: Secondary | ICD-10-CM | POA: Diagnosis not present

## 2020-04-27 DIAGNOSIS — R578 Other shock: Secondary | ICD-10-CM | POA: Diagnosis not present

## 2020-04-27 DIAGNOSIS — R404 Transient alteration of awareness: Secondary | ICD-10-CM | POA: Diagnosis not present

## 2020-04-27 DIAGNOSIS — R4182 Altered mental status, unspecified: Secondary | ICD-10-CM | POA: Diagnosis not present

## 2020-04-27 DIAGNOSIS — Z87891 Personal history of nicotine dependence: Secondary | ICD-10-CM

## 2020-04-27 DIAGNOSIS — Z801 Family history of malignant neoplasm of trachea, bronchus and lung: Secondary | ICD-10-CM

## 2020-04-27 DIAGNOSIS — R918 Other nonspecific abnormal finding of lung field: Secondary | ICD-10-CM | POA: Diagnosis not present

## 2020-04-27 LAB — CBC WITH DIFFERENTIAL/PLATELET
Abs Immature Granulocytes: 0.02 10*3/uL (ref 0.00–0.07)
Basophils Absolute: 0 10*3/uL (ref 0.0–0.1)
Basophils Relative: 1 %
Eosinophils Absolute: 0 10*3/uL (ref 0.0–0.5)
Eosinophils Relative: 1 %
HCT: 45.1 % (ref 39.0–52.0)
Hemoglobin: 14.3 g/dL (ref 13.0–17.0)
Immature Granulocytes: 0 %
Lymphocytes Relative: 13 %
Lymphs Abs: 0.7 10*3/uL (ref 0.7–4.0)
MCH: 27.3 pg (ref 26.0–34.0)
MCHC: 31.7 g/dL (ref 30.0–36.0)
MCV: 86.2 fL (ref 80.0–100.0)
Monocytes Absolute: 0.2 10*3/uL (ref 0.1–1.0)
Monocytes Relative: 4 %
Neutro Abs: 4.3 10*3/uL (ref 1.7–7.7)
Neutrophils Relative %: 81 %
Platelets: 142 10*3/uL — ABNORMAL LOW (ref 150–400)
RBC: 5.23 MIL/uL (ref 4.22–5.81)
RDW: 17.2 % — ABNORMAL HIGH (ref 11.5–15.5)
WBC: 5.3 10*3/uL (ref 4.0–10.5)
nRBC: 0 % (ref 0.0–0.2)

## 2020-04-27 LAB — COMPREHENSIVE METABOLIC PANEL
ALT: 20 U/L (ref 0–44)
AST: 30 U/L (ref 15–41)
Albumin: 3.9 g/dL (ref 3.5–5.0)
Alkaline Phosphatase: 94 U/L (ref 38–126)
Anion gap: 26 — ABNORMAL HIGH (ref 5–15)
BUN: 125 mg/dL — ABNORMAL HIGH (ref 8–23)
CO2: 18 mmol/L — ABNORMAL LOW (ref 22–32)
Calcium: 10.3 mg/dL (ref 8.9–10.3)
Chloride: 86 mmol/L — ABNORMAL LOW (ref 98–111)
Creatinine, Ser: 12.6 mg/dL — ABNORMAL HIGH (ref 0.61–1.24)
GFR calc Af Amer: 4 mL/min — ABNORMAL LOW (ref >60)
GFR calc non Af Amer: 4 mL/min — ABNORMAL LOW (ref >60)
Glucose, Bld: 565 mg/dL (ref 70–99)
Potassium: >7.5 mmol/L (ref 3.5–5.1)
Sodium: 130 mmol/L — ABNORMAL LOW (ref 135–145)
Total Bilirubin: 1.3 mg/dL — ABNORMAL HIGH (ref 0.3–1.2)
Total Protein: 7.3 g/dL (ref 6.5–8.1)

## 2020-04-27 LAB — SARS CORONAVIRUS 2 BY RT PCR (HOSPITAL ORDER, PERFORMED IN ~~LOC~~ HOSPITAL LAB): SARS Coronavirus 2: NEGATIVE

## 2020-04-27 MED ORDER — HEPARIN SODIUM (PORCINE) 5000 UNIT/ML IJ SOLN
5000.0000 [IU] | Freq: Three times a day (TID) | INTRAMUSCULAR | Status: DC
  Administered 2020-04-27 – 2020-05-12 (×44): 5000 [IU] via SUBCUTANEOUS
  Filled 2020-04-27 (×42): qty 1

## 2020-04-27 MED ORDER — CALCIUM CHLORIDE 10 % IV SOLN
INTRAVENOUS | Status: AC | PRN
  Administered 2020-04-27 (×2): 1 g via INTRAVENOUS

## 2020-04-27 MED ORDER — CHLORHEXIDINE GLUCONATE 0.12% ORAL RINSE (MEDLINE KIT)
15.0000 mL | Freq: Two times a day (BID) | OROMUCOSAL | Status: DC
  Administered 2020-04-28 (×3): 15 mL via OROMUCOSAL

## 2020-04-27 MED ORDER — DEXTROSE 50 % IV SOLN
1.0000 | Freq: Once | INTRAVENOUS | Status: AC
  Administered 2020-04-27: 50 mL via INTRAVENOUS
  Filled 2020-04-27: qty 50

## 2020-04-27 MED ORDER — DOCUSATE SODIUM 100 MG PO CAPS: 100.0000 mg | ORAL_CAPSULE | Freq: Two times a day (BID) | ORAL | Status: DC | PRN

## 2020-04-27 MED ORDER — ALBUTEROL SULFATE (2.5 MG/3ML) 0.083% IN NEBU
10.0000 mg | INHALATION_SOLUTION | Freq: Once | RESPIRATORY_TRACT | Status: AC
  Administered 2020-04-27: 10 mg via RESPIRATORY_TRACT
  Filled 2020-04-27: qty 12

## 2020-04-27 MED ORDER — CALCIUM GLUCONATE 10 % IV SOLN: 1.0000 g | Freq: Once | INTRAVENOUS | Status: DC

## 2020-04-27 MED ORDER — FENTANYL CITRATE (PF) 100 MCG/2ML IJ SOLN
50.0000 ug | INTRAMUSCULAR | Status: DC | PRN
  Filled 2020-04-27: qty 2

## 2020-04-27 MED ORDER — CHLORHEXIDINE GLUCONATE CLOTH 2 % EX PADS
6.0000 | MEDICATED_PAD | Freq: Every day | CUTANEOUS | Status: DC
  Administered 2020-04-28 – 2020-05-01 (×4): 6 via TOPICAL

## 2020-04-27 MED ORDER — HYDRALAZINE HCL 20 MG/ML IJ SOLN
10.0000 mg | INTRAMUSCULAR | Status: DC | PRN
  Administered 2020-04-27: 20 mg via INTRAVENOUS
  Administered 2020-04-27: 10 mg via INTRAVENOUS
  Administered 2020-04-28: 20 mg via INTRAVENOUS
  Administered 2020-05-01: 10 mg via INTRAVENOUS
  Administered 2020-05-02 – 2020-05-10 (×4): 20 mg via INTRAVENOUS
  Administered 2020-05-10: 40 mg via INTRAVENOUS
  Administered 2020-05-10: 20 mg via INTRAVENOUS
  Filled 2020-04-27: qty 1
  Filled 2020-04-27: qty 2
  Filled 2020-04-27 (×5): qty 1
  Filled 2020-04-27: qty 2
  Filled 2020-04-27 (×2): qty 1

## 2020-04-27 MED ORDER — PANTOPRAZOLE SODIUM 40 MG IV SOLR
40.0000 mg | Freq: Every day | INTRAVENOUS | Status: DC
  Administered 2020-04-27 – 2020-04-29 (×3): 40 mg via INTRAVENOUS
  Filled 2020-04-27 (×3): qty 40

## 2020-04-27 MED ORDER — SODIUM CHLORIDE 0.9 % IV SOLN
250.0000 mg | INTRAVENOUS | Status: DC
  Administered 2020-04-27: 250 mg via INTRAVENOUS
  Filled 2020-04-27 (×2): qty 2.5

## 2020-04-27 MED ORDER — SODIUM ZIRCONIUM CYCLOSILICATE 5 G PO PACK: 10.0000 g | PACK | Freq: Once | ORAL | Status: DC

## 2020-04-27 MED ORDER — FENTANYL CITRATE (PF) 100 MCG/2ML IJ SOLN: 50.0000 ug | INTRAMUSCULAR | Status: DC | PRN

## 2020-04-27 MED ORDER — INSULIN ASPART 100 UNIT/ML IV SOLN
5.0000 [IU] | Freq: Once | INTRAVENOUS | Status: AC
  Administered 2020-04-27: 5 [IU] via INTRAVENOUS

## 2020-04-27 MED ORDER — SODIUM BICARBONATE 8.4 % IV SOLN: 50.0000 meq | Freq: Once | INTRAVENOUS | Status: DC

## 2020-04-27 MED ORDER — INSULIN REGULAR(HUMAN) IN NACL 100-0.9 UT/100ML-% IV SOLN
INTRAVENOUS | Status: DC
  Filled 2020-04-27: qty 100

## 2020-04-27 MED ORDER — ORAL CARE MOUTH RINSE
15.0000 mL | OROMUCOSAL | Status: DC
  Administered 2020-04-28 – 2020-05-12 (×145): 15 mL via OROMUCOSAL

## 2020-04-27 MED ORDER — SODIUM BICARBONATE 8.4 % IV SOLN
INTRAVENOUS | Status: AC | PRN
  Administered 2020-04-27: 50 meq via INTRAVENOUS

## 2020-04-27 MED ORDER — INSULIN ASPART 100 UNIT/ML IV SOLN
10.0000 [IU] | Freq: Once | INTRAVENOUS | Status: AC
  Administered 2020-04-27: 10 [IU] via INTRAVENOUS

## 2020-04-27 MED ORDER — DOCUSATE SODIUM 50 MG/5ML PO LIQD
100.0000 mg | Freq: Two times a day (BID) | ORAL | Status: DC
  Administered 2020-04-28: 100 mg via ORAL
  Filled 2020-04-27 (×2): qty 10

## 2020-04-27 MED ORDER — ETOMIDATE 2 MG/ML IV SOLN
INTRAVENOUS | Status: AC | PRN
  Administered 2020-04-27: 20 mg via INTRAVENOUS

## 2020-04-27 MED ORDER — ROCURONIUM BROMIDE 50 MG/5ML IV SOLN
INTRAVENOUS | Status: AC | PRN
  Administered 2020-04-27: 80 mg via INTRAVENOUS

## 2020-04-27 MED ORDER — DEXTROSE 50 % IV SOLN
0.0000 mL | INTRAVENOUS | Status: DC | PRN
  Administered 2020-05-06: 25 mL via INTRAVENOUS
  Administered 2020-05-11: 50 mL via INTRAVENOUS
  Filled 2020-04-27 (×4): qty 50

## 2020-04-27 MED ORDER — PROPOFOL 1000 MG/100ML IV EMUL: 5.0000 ug/kg/min | INTRAVENOUS | Status: DC

## 2020-04-27 MED ORDER — PROPOFOL 1000 MG/100ML IV EMUL
60.0000 ug/kg/min | INTRAVENOUS | Status: DC
  Administered 2020-04-28: 20 ug/kg/min via INTRAVENOUS
  Administered 2020-04-29 (×2): 30 ug/kg/min via INTRAVENOUS
  Administered 2020-04-29 (×3): 50 ug/kg/min via INTRAVENOUS
  Administered 2020-04-30 – 2020-05-01 (×9): 60 ug/kg/min via INTRAVENOUS
  Administered 2020-05-01: 45.977 ug/kg/min via INTRAVENOUS
  Filled 2020-04-27 (×19): qty 100

## 2020-04-27 MED ORDER — PROPOFOL 1000 MG/100ML IV EMUL
INTRAVENOUS | Status: AC
  Administered 2020-04-27: 15 ug/kg/min
  Filled 2020-04-27: qty 100

## 2020-04-27 MED ORDER — POLYETHYLENE GLYCOL 3350 17 G PO PACK
17.0000 g | PACK | Freq: Every day | ORAL | Status: DC
  Administered 2020-04-28: 17 g via ORAL
  Filled 2020-04-27: qty 1

## 2020-04-27 MED ORDER — POLYETHYLENE GLYCOL 3350 17 G PO PACK: 17.0000 g | PACK | Freq: Every day | ORAL | Status: DC | PRN

## 2020-04-27 NOTE — H&P (Signed)
NAME:  Jared [name], Jared [name] MRN:  [id number], DOB:  08-23-1956, LOS: 0 ADMISSION DATE:  05/05/2020, CONSULTATION DATE: 6/1 REFERRING MD: Dr. Tyrone Nine EDP, CHIEF COMPLAINT: Unresponsiveness  Brief History   64 year old male with ESRD on HD unresponsive from home.  Intubated in ED.  Has missed 2 dialysis sessions.  Hyperkalemic and volume overloaded in the ED.  Admitted to ICU for emergent hemodialysis.  History of present illness   63 year old male with past medical history as below, which is significant for ESRD on HD TTS, diabetes, hypertension, hypoglycemic seizure, and stroke.  His last dialysis treatment was on Saturday 5/29, but was abbreviated.  After his dialysis session he went to [facility] with his sisters to celebrate his birthday.  His family worries he may have overdone it and not adhere to his renal diet or fluid restrictions.  Since returning home to [facility] he has complained of nausea, vomiting, and diarrhea.  The symptoms prevented him from going to dialysis on 6/1 and it was rescheduled for 6/2. In the late evening hours of 6/1 he was found at home to be unresponsive.  EMS was called.  Upon their arrival and learning his medical history he was treated empirically for hyperkalemia and was transported to the emergency department.  He remained unresponsive and was intubated for airway protection.  Laboratory evaluation significant for potassium greater than 7.5, bicarb 18, glucose 565, creatinine 12.6, and BUN 125.  He was also profoundly hypertensive with systolic blood pressures up to the 220s.  Nephrology evaluated in the emergency department and is planning for emergent dialysis.  PCCM asked to admit to ICU.  Past Medical History   has a past medical history of Anemia, Cancer ([place]), Chronic kidney disease, Diabetes mellitus without complication ([place]), GERD (gastroesophageal reflux disease), Hypertension, Seizures ([place]), Stroke ([place]), and Thyroid disease.  Significant Hospital Events    6/1 admit for emergent dialysis  Consults:  Nephrology  Procedures:  ETT 6/1>  Significant Diagnostic Tests:  CT head 6/1 >  Micro Data:    Antimicrobials:     Interim history/subjective:    Objective   Blood pressure (!) 232/114, pulse 95, temperature (!) 97.1 F (36.2 C), temperature source Temporal, resp. rate (!) 26, height 5\' 8"  (1.727 m), SpO2 96 %.    Vent Mode: PRVC FiO2 (%):  [100 %] 100 % Set Rate:  [18 bmp] 18 bmp Vt Set:  [550 mL] 550 mL PEEP:  [5 cmH20] 5 cmH20 Plateau Pressure:  [22 cmH20] 22 cmH20  No intake or output data in the 24 hours ending 05/01/2020 2137 There were no vitals filed for this visit.  Examination: General: Elderly appearing adult male on vent HENT: Normocephalic, atraumatic, PERRL Lungs: Crackles throughout Cardiovascular: Tachycardic, regular, no MRG Abdomen: Soft, nondistended Extremities: No acute deformity.  Left upper extremity fistula with positive thrill Neuro: [facility] Problem list     Assessment & Plan:   ESRD on HD: Missed dialysis today. Hyperkalemia -Admit to ICU for emergent hemodialysis -Temporizing measures for hyperkalemia -Nephrology following  Hypertensive crisis - As needed hydralazine - Lower systolic blood pressure to goal 170-1 90 until able to get HD - Propofol as an adjuvant antihypertensive  Acute hypoxemic respiratory failure: Chest x-ray consistent with pulmonary edema.  Requiring high PEEP and FiO2 -Full vent support -HD for volume removal -Follow ABG and chest x-ray -Vent bundle -SBT as tolerated  Diabetes mellitus: With hyperglycemia to 565 on admission -Start insulin infusion -Send beta hydroxybutyric acid, as  unable to check UA - If positive will convert to DKA protocol  Acute metabolic encephalopathy: Secondary to acidosis, uremia, and likely hypoxemia as well -Propofol for RASS goal -1 to -2 -As needed fentanyl for analgesia -Daily wake up assessment -HD to  correct metabolic abnormalities.  Seizures: hyperglycemic in the past - Continue home heparin  Secondary hyperparathyroidism -Check TSH - no home medication on med list  Best practice:  Diet: NPO Pain/Anxiety/Delirium protocol (if indicated): Proprol VAP protocol (if indicated): Per protocol DVT prophylaxis: SQH GI prophylaxis: PPI Glucose control: Insulin infusion Mobility: BR Code Status: FULL Family Communication: Brother in Sports coach bedside and has been updated Disposition: ICU  Labs   CBC: Recent Labs  Lab 05/18/2020 2024  WBC 5.3  NEUTROABS 4.3  HGB 14.3  HCT 45.1  MCV 86.2  PLT 142*    Basic Metabolic Panel: No results for input(s): NA, K, CL, CO2, GLUCOSE, BUN, CREATININE, CALCIUM, MG, PHOS in the last 168 hours. GFR: CrCl cannot be calculated (Patient's most recent lab result is older than the maximum 21 days allowed.). Recent Labs  Lab 05/05/2020 2024  WBC 5.3    Liver Function Tests: No results for input(s): AST, ALT, ALKPHOS, BILITOT, PROT, ALBUMIN in the last 168 hours. No results for input(s): LIPASE, AMYLASE in the last 168 hours. No results for input(s): AMMONIA in the last 168 hours.  ABG    Component Value Date/Time   PHART 7.365 04/29/2018 0731   PCO2ART 39.5 04/29/2018 0731   PO2ART 134.0 (H) 04/29/2018 0731   HCO3 22.7 04/29/2018 0731   TCO2 24 04/29/2018 0731   ACIDBASEDEF 3.0 (H) 04/29/2018 0731   O2SAT 99.0 04/29/2018 0731     Coagulation Profile: No results for input(s): INR, PROTIME in the last 168 hours.  Cardiac Enzymes: No results for input(s): CKTOTAL, CKMB, CKMBINDEX, TROPONINI in the last 168 hours.  HbA1C: Hemoglobin A1C  Date/Time Value Ref Range Status  10/30/2018 04:39 PM 7.2 (A) 4.0 - 5.6 % Final   Hgb A1c MFr Bld  Date/Time Value Ref Range Status  02/13/2018 02:08 PM 6.3 (H) 4.8 - 5.6 % Final    Comment:    (NOTE) Pre diabetes:          5.7%-6.4% Diabetes:              >6.4% Glycemic control for    <7.0% adults with diabetes   11/26/2017 02:40 AM 8.9 (H) 4.8 - 5.6 % Final    Comment:    (NOTE) Pre diabetes:          5.7%-6.4% Diabetes:              >6.4% Glycemic control for   <7.0% adults with diabetes     CBG: No results for input(s): GLUCAP in the last 168 hours.  Review of Systems:   Patient is encephalopathic and/or intubated. Therefore history has been obtained from chart review.    Past Medical History  He,  has a past medical history of Anemia, Cancer ([place]), Chronic kidney disease, Diabetes mellitus without complication ([place]), GERD (gastroesophageal reflux disease), Hypertension, Seizures ([place]), Stroke ([place]), and Thyroid disease.   Surgical History    Past Surgical History:  Procedure Laterality Date  . AV FISTULA PLACEMENT Left 06/04/2014   Procedure: ARTERIOVENOUS (AV) FISTULA CREATION-  BRACHIOCEPHALIC WITH LIGATION OF COMPETEING BRANCH;  Surgeon: Mal Misty, MD;  Location: [place];  Service: Vascular;  Laterality: Left;  . INGUINAL HERNIA REPAIR Left 02/19/2018   Procedure: LAPAROSCOPIC  LEFT  INGUINAL HERNIA;  Surgeon: Ralene Ok, MD;  Location: [place];  Service: General;  Laterality: Left;  . INSERTION OF DIALYSIS CATHETER  04/2014  . INSERTION OF MESH Left 02/19/2018   Procedure: INSERTION OF MESH;  Surgeon: Ralene Ok, MD;  Location: [place];  Service: General;  Laterality: Left;  . IR GENERIC HISTORICAL Left 11/30/2016   IR THROMBECTOMY AV FISTULA W/THROMBOLYSIS/PTA/STENT INC/SHUNT/IMG LT 11/30/2016 Markus Daft, MD [facility]-INTERV RAD  . IR GENERIC HISTORICAL  11/30/2016   IR US GUIDE VASC ACCESS LEFT 11/30/2016 Markus Daft, MD [facility]INTER[facility]  . IR PARACENTESIS  08/21/2018     Social History   reports that he quit smoking about 35 years ago. He has never used smokeless tobacco. He reports current alcohol use of about 1.0 standard drinks of alcohol per week. He reports that he does not use drugs.   Family History   His family history includes Cancer - Lung in his  mother; Cancer - Other in his father. There is no history of Diabetes.   Allergies Allergies  Allergen Reactions  . Penicillins Other (See Comments)    Tolerates cephalosporins  UNSPECIFIED REACTION  Has patient had a PCN reaction causing immediate rash, facial/tongue/throat swelling, SOB or lightheadedness with hypotension: Unk Has patient had a PCN reaction causing severe rash involving mucus membranes or skin necrosis: Unk Has patient had a PCN reaction that required hospitalization: Unk Has patient had a PCN reaction occurring within the last 10 years: Unk If all of the above answers are "NO", then may proceed with Cephalosporin use.      Home Medications  Prior to Admission medications   Medication Sig Start Date End Date Taking? Authorizing Provider  amLODipine (NORVASC) 10 MG tablet Take 1 tablet (10 mg total) by mouth daily. 12/02/17   Sheikh, Georgina Quint Latif, DO  atorvastatin (LIPITOR) 20 MG tablet TAKE 1 TABLET BY MOUTH ONCE DAILY 09/06/18   Henson, Vickie L, NP-C  b complex-vitamin c-folic acid (NEPHRO-VITE) 0.8 MG TABS tablet TAKE 1 TABLET BY MOUTH EVERY DAY (ON DIALYSIS DAYS, TAKE AFTER DIALYSIS TREATMENT) 09/25/18   [provider]  cinacalcet (SENSIPAR) 30 MG tablet Take 30 mg by mouth daily. 04/25/18   [provider]  cloNIDine (CATAPRES) 0.3 MG tablet Take 0.3 mg by mouth 2 (two) times daily. as directed 04/24/18   [provider]  hydrALAZINE (APRESOLINE) 100 MG tablet Take 1 tablet (100 mg total) by mouth 3 (three) times daily. 03/27/17   Angiulli, Lavon Paganini, PA-C  insulin NPH Human (NOVOLIN N) 100 UNIT/ML injection Inject 20 units every morning Patient taking differently: Inject 30 Units into the skin daily before breakfast.  04/03/17   Renato Shin, MD  isosorbide mononitrate (IMDUR) 30 MG 24 hr tablet Take 1 tablet by mouth once daily 04/04/19   Raenette Rover, Vickie L, NP-C  levETIRAcetam (KEPPRA) 250 MG tablet Take 1 tablet by mouth twice daily 09/03/19    Frann Rider, NP  metoprolol tartrate (LOPRESSOR) 100 MG tablet Take 1 tablet (100 mg total) by mouth 2 (two) times daily. 05/21/17   Henson, Vickie L, NP-C  ondansetron (ZOFRAN ODT) 4 MG disintegrating tablet Take 1 tablet (4 mg total) by mouth every 8 (eight) hours as needed for nausea or vomiting. 10/23/18   Virgel Manifold, MD  pantoprazole (PROTONIX) 40 MG tablet TAKE 1 TABLET BY MOUTH ONCE DAILY AT 6AM Patient taking differently: TAKE 1 TABLET BY MOUTH twice DAILy 02/01/18   Henson, Vickie L, NP-C  promethazine-dextromethorphan (PROMETHAZINE-DM)  6.25-15 MG/5ML syrup TAKE 5 MLS BY MOUTH 4 TIMES DAILY AS NEEDED 10/21/18   [provider]  sevelamer carbonate (RENVELA) 800 MG tablet Take 3 tablets (2,400 mg total) by mouth 3 (three) times daily with meals. 03/27/17   Cathlyn Parsons, PA-C     Critical care time: 45 mins     Georgann Housekeeper, AGACNP-BC [place] for personal pager PCCM on call pager [phone]  05/07/2020 10:23 PM

## 2020-04-27 NOTE — Progress Notes (Signed)
Patient transported from Trauma A to 7D44 without complications.

## 2020-04-27 NOTE — Consult Note (Addendum)
[name]  Reason for Consultation: hyperkalemia, ESRD, missed dialysis Requesting Provider: Dr. Tyrone Nine  HPI: Jared [name]. is an 64 y.o. male is a 38M with ESRD on HD since 2015 (presumed HTN/DM), DM, prostate ca, GERD, h/o CVA, h/o seizure who is seen for severe hyperkalemia.   Pt missed HD today, c/o N/V, unresponsive, EMS called, became unreponsive; treated for presumed hyperK and WCT with IV calcium, bicarb,lidocaine.  GCS 4 on arrival [facility], intubated. Initial ABG (results not in epic currently) with reported K > 8; CMP still pending.  Afebrile, tachycardic, hypertensive.   Has issues with high IDWG and hyperkalemia outpt.  On lokelma.  Tx time 4 hrs, looks like lately 3:30 is all he runs and on Sat ran 2:37, post wt 73.7kg, EDW 72.5kg.  Being admitted to ICU.  PMH: Past Medical History:  Diagnosis Date   Anemia    Cancer ([place])    early prostate cancer per patient   Chronic kidney disease    on dialysis M-W-F   Diabetes mellitus without complication ([place])    onset as adult   GERD (gastroesophageal reflux disease)    Hypertension    Seizures ([place])    pt states he thinks this is bc of his blood sugar   Stroke [facility])    patient denies   Thyroid disease    hyperparathyroidism   PSH: Past Surgical History:  Procedure Laterality Date   AV FISTULA PLACEMENT Left 06/04/2014   Procedure: ARTERIOVENOUS (AV) FISTULA CREATION-  BRACHIOCEPHALIC WITH LIGATION OF [place];  Surgeon: Mal Misty, MD;  Location: [place];  Service: Vascular;  Laterality: Left;   INGUINAL HERNIA REPAIR Left 02/19/2018   Procedure: LAPAROSCOPIC LEFT  INGUINAL HERNIA;  Surgeon: Ralene Ok, MD;  Location: [place];  Service: General;  Laterality: Left;   INSERTION OF DIALYSIS CATHETER  04/2014   INSERTION OF MESH Left 02/19/2018   Procedure: INSERTION OF MESH;  Surgeon: Ralene Ok, MD;  Location: [place];  Service: General;  Laterality:  Left;   IR GENERIC HISTORICAL Left 11/30/2016   IR THROMBECTOMY AV FISTULA W/THROMBOLYSIS/PTA/STENT INC/SHUNT/IMG LT 11/30/2016 Markus Daft, MD [facility]INTER[facility]   IR GENERIC HISTORICAL  11/30/2016   IR US GUIDE VASC ACCESS LEFT 11/30/2016 Markus Daft, MD [facility]   IR PARACENTESIS  08/21/2018    Past Medical History:  Diagnosis Date   Anemia    Cancer ([place])    early prostate cancer per patient   Chronic kidney disease    on dialysis M-W-F   Diabetes mellitus without complication ([place])    onset as adult   GERD (gastroesophageal reflux disease)    Hypertension    Seizures ([place])    pt states he thinks this is bc of his blood sugar   Stroke [facility])    patient denies   Thyroid disease    hyperparathyroidism    Medications:  I have reviewed the patient's current medications.  (Not in a hospital admission)   ALLERGIES:   Allergies  Allergen Reactions   Penicillins Other (See Comments)    Tolerates cephalosporins  UNSPECIFIED REACTION  Has patient had a PCN reaction causing immediate rash, facial/tongue/throat swelling, SOB or lightheadedness with hypotension: Unk Has patient had a PCN reaction causing severe rash involving mucus membranes or skin necrosis: Unk Has patient had a PCN reaction that required hospitalization: Unk Has patient had a PCN reaction occurring within the last 10 years: Unk If all of the above answers  are "NO", then may proceed with Cephalosporin use.     FAM HX: Family History  Problem Relation Age of Onset   Cancer - Lung Mother    Cancer - Other Father    Diabetes Neg Hx     Social History:   reports that he quit smoking about 35 years ago. He has never used smokeless tobacco. He reports current alcohol use of about 1.0 standard drinks of alcohol per week. He reports that he does not use drugs.  ROS: unable to obtain from intubated patient  Blood pressure (!) 169/153, pulse (!) 139, temperature (!) 97.1 F (36.2 C), temperature source  Temporal, SpO2 94 %. PHYSICAL EXAM: Gen: intubated, sedated  Eyes: anicteric ENT: ETT in place CV:  Tachycardic, regular, no rub Abd: soft Lungs: coarse anteriorly, O2 sat low 90s on 80% FiO2 GU: no foley Extr: trace tibial edema, LUE AVF +t/b, 2 moderate aneurysms with normal overlying skin Neuro: sedated on propofol Skin: warm and dry, no rashes   No results found for this or any previous visit (from the past 48 hour(s)).  No results found.   HD orders: NW TTS 4hrs 400/800 2K/2Ca EDW 72.5 hectorol 7 qtx Heparin 2300 qtx  Assessment/Plan **Hyperkalemia:  Acute HD on arrival to ICU, d/w PCCM and dialysis RN to facilitate.  1K dialysate x 1hr then 2K.  Hold heparin, saline flush PRN.  Temporizing measures until HD.   **Acute hypoxic resp failure: On Mech vent now. CXR appears with overload.  UF 3L tonight as tolerated.    **HTN: BP initially 140s, now 200s; defer acute therapy to PCCM, should improve with UF.   **AMS:   Will need further eval after stabilized  **ESRD on HD: emergent HD tonight then will resume schedule at some point  **Secondary hyperPTH: cont outpt hectorol dose   Justin Mend 05/05/2020, 8:49 PM   ADDENDUM: labs returned - K > 7.5, bicarb 18, BUN 125, Na 130, Gluc 565.  Per above, acute HD tonight for hyperkalemia.

## 2020-04-27 NOTE — Progress Notes (Signed)
Lab iSTAT results not crossing over into Epic at this time. ABG done and results are as follows at patient temperature:  pH: 7.315 PCO2: 42.3 PO2: 77 BE,B: -5 HCO3: 21.7 TCO2: 23 sO2: 95% Na:128 K: 7.1 ICa: 1.46 Hct: 43% Hb* via Hct: 14.6

## 2020-04-27 NOTE — ED Triage Notes (Signed)
Pt bib [facility] from home unresponsive. Pt is tues, thurs, sat dialysis pt, last dialysis treatment thurs per EMS. Pt c/o n/v today and then became unresponsive at around approx 1745 per EMS. Pt has hx CVA. Pt 99% on NRB on arrival to ED. Pt received 50mg  lidocaine, 1 g calcium, 50mg  sodium bicarb w/ EMS. EMS BP 200/90, HR 85, RR 26.

## 2020-04-27 NOTE — ED Provider Notes (Signed)
[facility] EMERGENCY DEPARTMENT Provider Note   CSN: [id number] Arrival date & time: 05/04/2020  1957     History Chief Complaint  Patient presents with  . Loss of Consciousness    Jared [name]. is a 64 y.o. male.  Patient is a 64 year old male presents the ED with EMS after being found at home unresponsive after missing dialysis for approximately 1 week.  Patient received calcium with EMS for cardiac findings concerning for hyperkalemia.  On arrival patient has a GCS of 4, acutely ill requiring emergency intubation.  The history is provided by the EMS personnel and medical records.  Illness Location:  Renal/cardiac Quality:  Missed dialysis, hyperkalemia Severity:  Severe Onset quality:  Gradual Timing:  Unable to specify Progression:  Worsening Chronicity:  Recurrent Context:  Patient is on hemodialysis and reportedly missed dialysis for at least a week. Relieved by:  Supplemental oxygen, calcium Worsened by:  Nothing Ineffective treatments:  None tried Associated symptoms: loss of consciousness and shortness of breath        Past Medical History:  Diagnosis Date  . Anemia   . Cancer [facility])    early prostate cancer per patient  . Chronic kidney disease    on dialysis M-W-F  . Diabetes mellitus without complication ([place])    onset as adult  . GERD (gastroesophageal reflux disease)   . Hypertension   . Seizures ([place])    pt states he thinks this is bc of his blood sugar  . Stroke [facility])    patient denies  . Thyroid disease    hyperparathyroidism    Patient Active Problem List   Diagnosis Date Noted  . Endotracheally intubated   . Acute pulmonary edema (HCC)   . Hypervolemia   . Acute on chronic renal failure ([place]) 05/15/2020  . At risk for bone density loss 12/18/2018  . Hyperosmolality   . Respiratory insufficiency 04/29/2018  . Altered mental status   . Acute metabolic encephalopathy 20/35/5974  . Metabolic encephalopathy  16/38/4536  . Encephalopathy   . Type 2 diabetes mellitus with peripheral neuropathy (HCC)   . History of prostate cancer   . Acute blood loss anemia   . Witnessed seizure-like activity ([place])   . Weakness   . Prostate cancer ([place]) 03/13/2017  . Hyperglycemia 03/13/2017  . Accelerated hypertension 03/13/2017  . Elevated LDL cholesterol level 01/24/2017  . End-stage renal disease on peritoneal dialysis ([place])   . Anemia associated with chronic renal failure   . Abdominal pain 12/08/2016  . Enlarged prostate 12/08/2016  . 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 Hypertension   . ESRD (end stage renal disease) on dialysis ([place])   . Acute respiratory failure ([place])   . Seizure ([place]) 11/24/2016  . Status epilepticus [facility])     Past Surgical History:  Procedure Laterality Date  . AV FISTULA PLACEMENT Left 06/04/2014   Procedure: ARTERIOVENOUS (AV) FISTULA CREATION-  BRACHIOCEPHALIC WITH LIGATION OF COMPETEING BRANCH;  Surgeon: Mal Misty, MD;  Location: [facility];  Service: Vascular;  Laterality: Left;  . INGUINAL HERNIA REPAIR Left 02/19/2018   Procedure: LAPAROSCOPIC LEFT  INGUINAL HERNIA;  Surgeon: Ralene Ok, MD;  Location: [facility];  Service: General;  Laterality: Left;  . INSERTION OF DIALYSIS CATHETER  04/2014  . INSERTION OF MESH Left 02/19/2018   Procedure: INSERTION OF MESH;  Surgeon: Ralene Ok, MD;  Location: [place];  Service: General;  Laterality: Left;  . IR GENERIC HISTORICAL Left 11/30/2016  IR THROMBECTOMY AV FISTULA W/THROMBOLYSIS/PTA/STENT INC/SHUNT/IMG LT 11/30/2016 Markus Daft, MD [facility]-INTERV RAD  . IR GENERIC HISTORICAL  11/30/2016   IR US GUIDE VASC ACCESS LEFT 11/30/2016 Markus Daft, MD [facility]-INTERV RAD  . IR PARACENTESIS  08/21/2018       Family History  Problem Relation Age of Onset  . Cancer - Lung Mother   . Cancer - Other Father   . Diabetes Neg Hx     Social History   Tobacco Use  . Smoking status: Former Smoker    Quit date: 05/11/1984    Years since quitting:  35.9  . Smokeless tobacco: Never Used  Substance Use Topics  . Alcohol use: Yes    Alcohol/week: 1.0 standard drinks    Types: 1 Glasses of wine per week    Comment: occasional  . Drug use: No    Home Medications Prior to Admission medications   Medication Sig Start Date End Date Taking? Authorizing Provider  amLODipine (NORVASC) 10 MG tablet Take 1 tablet (10 mg total) by mouth daily. 12/02/17   Sheikh, Georgina Quint Latif, DO  atorvastatin (LIPITOR) 20 MG tablet TAKE 1 TABLET BY MOUTH ONCE DAILY 09/06/18   Henson, Vickie L, NP-C  b complex-vitamin c-folic acid (NEPHRO-VITE) 0.8 MG TABS tablet TAKE 1 TABLET BY MOUTH EVERY DAY (ON DIALYSIS DAYS, TAKE AFTER DIALYSIS TREATMENT) 09/25/18   [provider]  cinacalcet (SENSIPAR) 30 MG tablet Take 30 mg by mouth daily. 04/25/18   [provider]  cloNIDine (CATAPRES) 0.3 MG tablet Take 0.3 mg by mouth 2 (two) times daily. as directed 04/24/18   [provider]  hydrALAZINE (APRESOLINE) 100 MG tablet Take 1 tablet (100 mg total) by mouth 3 (three) times daily. 03/27/17   Angiulli, Lavon Paganini, PA-C  insulin NPH Human (NOVOLIN N) 100 UNIT/ML injection Inject 20 units every morning Patient taking differently: Inject 30 Units into the skin daily before breakfast.  04/03/17   Renato Shin, MD  isosorbide mononitrate (IMDUR) 30 MG 24 hr tablet Take 1 tablet by mouth once daily 04/04/19   Raenette Rover, Vickie L, NP-C  levETIRAcetam (KEPPRA) 250 MG tablet Take 1 tablet by mouth twice daily 09/03/19   Frann Rider, NP  metoprolol tartrate (LOPRESSOR) 100 MG tablet Take 1 tablet (100 mg total) by mouth 2 (two) times daily. 05/21/17   Henson, Vickie L, NP-C  ondansetron (ZOFRAN ODT) 4 MG disintegrating tablet Take 1 tablet (4 mg total) by mouth every 8 (eight) hours as needed for nausea or vomiting. 10/23/18   Virgel Manifold, MD  pantoprazole (PROTONIX) 40 MG tablet TAKE 1 TABLET BY MOUTH ONCE DAILY AT 6AM Patient taking differently: TAKE 1 TABLET BY  MOUTH twice DAILy 02/01/18   Henson, Vickie L, NP-C  promethazine-dextromethorphan (PROMETHAZINE-DM) 6.25-15 MG/5ML syrup TAKE 5 MLS BY MOUTH 4 TIMES DAILY AS NEEDED 10/21/18   [provider]  sevelamer carbonate (RENVELA) 800 MG tablet Take 3 tablets (2,400 mg total) by mouth 3 (three) times daily with meals. 03/27/17   Angiulli, Lavon Paganini, PA-C    Allergies    Penicillins  Review of Systems   Review of Systems  Unable to perform ROS: Acuity of condition  Respiratory: Positive for shortness of breath.   Neurological: Positive for loss of consciousness.    Physical Exam Updated Vital Signs BP (!) 197/97   Pulse (!) 105   Temp 99.7 F (37.6 C) (Axillary)   Resp (!) 22   Ht _0  (1.727 m)   Wt 76.6 kg  SpO2 98%   BMI 25.68 kg/m   Physical Exam Vitals and nursing note reviewed.  Constitutional:      General: He is in acute distress.     Appearance: He is well-developed. He is toxic-appearing.  HENT:     Head: Normocephalic and atraumatic.     Right Ear: External ear normal.     Left Ear: External ear normal.     Nose: Nose normal.     Mouth/Throat:     Mouth: Mucous membranes are moist.  Eyes:     Conjunctiva/sclera: Conjunctivae normal.  Cardiovascular:     Rate and Rhythm: Tachycardia present.     Heart sounds: No murmur.  Pulmonary:     Effort: Respiratory distress present.     Breath sounds: Normal breath sounds.  Abdominal:     Palpations: Abdomen is soft.     Tenderness: There is no abdominal tenderness.  Musculoskeletal:     Cervical back: Neck supple.  Skin:    General: Skin is warm and dry.  Neurological:     Mental Status: He is unresponsive.     ED Results / Procedures / Treatments   Labs (all labs ordered are listed, but only abnormal results are displayed) Labs Reviewed  COMPREHENSIVE METABOLIC PANEL - Abnormal; Notable for the following components:      Result Value   Sodium 130 (*)    Potassium >7.5 (*)    Chloride 86 (*)    CO2  18 (*)    Glucose, Bld 565 (*)    BUN 125 (*)    Creatinine, Ser 12.60 (*)    Total Bilirubin 1.3 (*)    GFR calc non Af Amer 4 (*)    GFR calc Af Amer 4 (*)    Anion gap 26 (*)    All other components within normal limits  CBC WITH DIFFERENTIAL/PLATELET - Abnormal; Notable for the following components:   RDW 17.2 (*)    Platelets 142 (*)    All other components within normal limits  CBC - Abnormal; Notable for the following components:   RDW 16.8 (*)    All other components within normal limits  BASIC METABOLIC PANEL - Abnormal; Notable for the following components:   Sodium 134 (*)    Potassium 6.6 (*)    Chloride 92 (*)    CO2 21 (*)    Glucose, Bld 304 (*)    BUN 121 (*)    Creatinine, Ser 13.02 (*)    Calcium 10.8 (*)    GFR calc non Af Amer 4 (*)    GFR calc Af Amer 4 (*)    Anion gap 21 (*)    All other components within normal limits  HEMOGLOBIN A1C - Abnormal; Notable for the following components:   Hgb A1c MFr Bld 9.6 (*)    All other components within normal limits  OSMOLALITY - Abnormal; Notable for the following components:   Osmolality 353 (*)    All other components within normal limits  SARS CORONAVIRUS 2 BY RT PCR (HOSPITAL ORDER, [facility])  MRSA PCR SCREENING  TSH  TRIGLYCERIDES  BLOOD GAS, ARTERIAL  HIV ANTIBODY (ROUTINE TESTING W REFLEX)  BETA-HYDROXYBUTYRIC ACID  BASIC METABOLIC PANEL  BASIC METABOLIC PANEL  BLOOD GAS, ARTERIAL  BASIC METABOLIC PANEL  I-STAT CHEM 8, ED  I-STAT ARTERIAL BLOOD GAS, ED    EKG EKG Interpretation  Date/Time:  Tuesday April 27 2020 20:24:49 EDT Ventricular Rate:  129  PR Interval:    QRS Duration: 208 QT Interval:  416 QTC Calculation: 610 R Axis:   -62 Text Interpretation: Sinus tachycardia Consider right atrial enlargement Nonspecific IVCD with LAD LVH with secondary repolarization abnormality Baseline wander in lead(s) II III aVR aVL aVF with QRS widening Confirmed by Deno Etienne  [phone]) on 05/24/2020 9:17:07 PM   Radiology DG Chest Portable 1 View  Result Date: 05/11/2020 CLINICAL DATA:  Check endotracheal tube placement, unresponsive with missed dialysis session EXAM: PORTABLE CHEST 1 VIEW COMPARISON:  12/13/2018 FINDINGS: Cardiac shadow is within normal limits. Endotracheal tube and gastric catheter are seen in satisfactory position. Diffuse increased perihilar density is noted likely related to vascular congestion and pulmonary edema. Mild interstitial facial edema is noted as well. No focal confluent infiltrate is seen. No bony abnormality is noted. IMPRESSION: Changes consistent with vascular congestion and pulmonary edema. Tubes and lines as described. Electronically Signed   By: Inez Catalina M.D.   On: 05/26/2020 21:12    Procedures Procedure Name: Intubation Date/Time: 05/13/2020 8:29 PM Performed by: Delma Post, MD Pre-anesthesia Checklist: Patient identified, Emergency Drugs available, Suction available, Timeout performed and Patient being monitored Oxygen Delivery Method: Non-rebreather mask Preoxygenation: Pre-oxygenation with 100% oxygen Induction Type: Rapid sequence Ventilation: Mask ventilation without difficulty Laryngoscope Size: Glidescope, 4 and Mac Grade View: Grade I Tube size: 8.0 mm Number of attempts: 1 Airway Equipment and Method: Video-laryngoscopy Placement Confirmation: ETT inserted through vocal cords under direct vision,  Positive ETCO2,  CO2 detector and Breath sounds checked- equal and bilateral Secured at: 25 cm Tube secured with: ETT holder Dental Injury: Teeth and Oropharynx as per pre-operative assessment       (including critical care time)  Medications Ordered in ED Medications  sodium bicarbonate injection 50 mEq (50 mEq Intravenous Not Given 05/05/2020 2042)  calcium gluconate inj 10% (1 g) URGENT USE ONLY! (1 g Intravenous Not Given 05/17/2020 2040)  hydrALAZINE (APRESOLINE) injection 10-40 mg (20 mg Intravenous Given  05/05/2020 2336)  docusate sodium (COLACE) capsule 100 mg (has no administration in time range)  polyethylene glycol (MIRALAX / GLYCOLAX) packet 17 g (has no administration in time range)  heparin injection 5,000 Units (5,000 Units Subcutaneous Given 05/25/2020 2204)  pantoprazole (PROTONIX) injection 40 mg (40 mg Intravenous Given 05/06/2020 2204)  Chlorhexidine Gluconate Cloth 2 % PADS 6 each (has no administration in time range)  sodium zirconium cyclosilicate (LOKELMA) packet 10 g (has no administration in time range)  levETIRAcetam (KEPPRA) 250 mg in sodium chloride 0.9 % 100 mL IVPB (250 mg Intravenous New Bag/Given 05/15/2020 2257)  insulin regular, human (MYXREDLIN) 100 units/ 100 mL infusion (has no administration in time range)  dextrose 50 % solution 0-50 mL (has no administration in time range)  docusate (COLACE) 50 MG/5ML liquid 100 mg (has no administration in time range)  polyethylene glycol (MIRALAX / GLYCOLAX) packet 17 g (has no administration in time range)  propofol (DIPRIVAN) 1000 MG/100ML infusion (30 mcg/kg/min  Rate/Dose Change 05/03/2020 2118)  etomidate (AMIDATE) injection (20 mg Intravenous Given 05/26/2020 2008)  rocuronium (ZEMURON) injection (80 mg Intravenous Given 05/13/2020 2009)  insulin aspart (novoLOG) injection 5 Units (5 Units Intravenous Given 05/18/2020 2036)    And  dextrose 50 % solution 50 mL (50 mLs Intravenous Given 05/14/2020 2040)  albuterol (PROVENTIL) (2.5 MG/3ML) 0.083% nebulizer solution 10 mg (10 mg Nebulization Given 05/19/2020 2045)  sodium bicarbonate injection (50 mEq Intravenous Given 05/14/2020 2031)  calcium chloride injection (1 g Intravenous Given 05/19/2020 2052)  insulin aspart (novoLOG) injection 10 Units (10 Units Intravenous Given 05/06/2020 2121)    ED Course  I have reviewed the triage vital signs and the nursing notes.  Pertinent labs & imaging results that were available during my care of the patient were reviewed by me and considered in my medical decision making (see  chart for details).    MDM Rules/Calculators/A&P                     Patient is a 64 year old male presenting to the ED unresponsive, in acute distress requiring intubation followed by temporizing shifting agents for severe hyperkalemia after missed hemodialysis requiring nephrology consult, critical care admission to the ICU.  Patient's vital signs on arrival are critical with severe tachycardia, hypertension.  Patient's physical exam concerning for unresponsiveness requiring emergent intubation per procedure above.  Patient continued having cardiac abnormalities likely from his hyperkalemia.  Multiple agents as listed above are used for temporizing and shifting.  Nephrology consulted and will evaluate the patient.  Critical care consulted, admit to their service for further treatment and evaluation.  Final Clinical Impression(s) / ED Diagnoses Final diagnoses:  ESRD on dialysis ([place])  Other hypervolemia  Hyperkalemia  Somnolence    Rx / DC Orders ED Discharge Orders    None       Delma Post, MD 04/28/20 0305    Deno Etienne, DO 04/28/20 1529

## 2020-04-27 NOTE — ED Notes (Addendum)
CBG >600 °

## 2020-04-27 NOTE — ED Notes (Signed)
CBG 499

## 2020-04-27 NOTE — Sedation Documentation (Signed)
ET tube placed, good color change, 23 at the lip

## 2020-04-27 NOTE — Sedation Documentation (Signed)
OG tube placed  

## 2020-04-28 ENCOUNTER — Inpatient Hospital Stay (HOSPITAL_COMMUNITY): Payer: Medicare Other

## 2020-04-28 DIAGNOSIS — J810 Acute pulmonary edema: Secondary | ICD-10-CM

## 2020-04-28 DIAGNOSIS — E8770 Fluid overload, unspecified: Secondary | ICD-10-CM

## 2020-04-28 DIAGNOSIS — Z978 Presence of other specified devices: Secondary | ICD-10-CM

## 2020-04-28 LAB — BASIC METABOLIC PANEL
Anion gap: 21 — ABNORMAL HIGH (ref 5–15)
Anion gap: 16 — ABNORMAL HIGH (ref 5–15)
Anion gap: 23 — ABNORMAL HIGH (ref 5–15)
Anion gap: 17 — ABNORMAL HIGH (ref 5–15)
BUN: 121 mg/dL — ABNORMAL HIGH (ref 8–23)
BUN: 40 mg/dL — ABNORMAL HIGH (ref 8–23)
BUN: 58 mg/dL — ABNORMAL HIGH (ref 8–23)
BUN: 68 mg/dL — ABNORMAL HIGH (ref 8–23)
CO2: 21 mmol/L — ABNORMAL LOW (ref 22–32)
CO2: 24 mmol/L (ref 22–32)
CO2: 17 mmol/L — ABNORMAL LOW (ref 22–32)
CO2: 24 mmol/L (ref 22–32)
Calcium: 10.8 mg/dL — ABNORMAL HIGH (ref 8.9–10.3)
Calcium: 9.7 mg/dL (ref 8.9–10.3)
Calcium: 9 mg/dL (ref 8.9–10.3)
Calcium: 8.9 mg/dL (ref 8.9–10.3)
Chloride: 92 mmol/L — ABNORMAL LOW (ref 98–111)
Chloride: 96 mmol/L — ABNORMAL LOW (ref 98–111)
Chloride: 92 mmol/L — ABNORMAL LOW (ref 98–111)
Chloride: 94 mmol/L — ABNORMAL LOW (ref 98–111)
Creatinine, Ser: 13.02 mg/dL — ABNORMAL HIGH (ref 0.61–1.24)
Creatinine, Ser: 5.74 mg/dL — ABNORMAL HIGH (ref 0.61–1.24)
Creatinine, Ser: 8.1 mg/dL — ABNORMAL HIGH (ref 0.61–1.24)
Creatinine, Ser: 8.97 mg/dL — ABNORMAL HIGH (ref 0.61–1.24)
GFR calc Af Amer: 4 mL/min — ABNORMAL LOW (ref >60)
GFR calc Af Amer: 11 mL/min — ABNORMAL LOW (ref >60)
GFR calc Af Amer: 7 mL/min — ABNORMAL LOW (ref >60)
GFR calc Af Amer: 6 mL/min — ABNORMAL LOW (ref >60)
GFR calc non Af Amer: 4 mL/min — ABNORMAL LOW (ref >60)
GFR calc non Af Amer: 9 mL/min — ABNORMAL LOW (ref >60)
GFR calc non Af Amer: 6 mL/min — ABNORMAL LOW (ref >60)
GFR calc non Af Amer: 6 mL/min — ABNORMAL LOW (ref >60)
Glucose, Bld: 304 mg/dL — ABNORMAL HIGH (ref 70–99)
Glucose, Bld: 136 mg/dL — ABNORMAL HIGH (ref 70–99)
Glucose, Bld: 291 mg/dL — ABNORMAL HIGH (ref 70–99)
Glucose, Bld: 280 mg/dL — ABNORMAL HIGH (ref 70–99)
Potassium: 6.6 mmol/L (ref 3.5–5.1)
Potassium: 4 mmol/L (ref 3.5–5.1)
Potassium: 6.8 mmol/L (ref 3.5–5.1)
Potassium: 5.4 mmol/L — ABNORMAL HIGH (ref 3.5–5.1)
Sodium: 134 mmol/L — ABNORMAL LOW (ref 135–145)
Sodium: 136 mmol/L (ref 135–145)
Sodium: 132 mmol/L — ABNORMAL LOW (ref 135–145)
Sodium: 135 mmol/L (ref 135–145)

## 2020-04-28 LAB — GLUCOSE, CAPILLARY
Glucose-Capillary: >600 mg/dL (ref 70–99)
Glucose-Capillary: 499 mg/dL — ABNORMAL HIGH (ref 70–99)
Glucose-Capillary: 393 mg/dL — ABNORMAL HIGH (ref 70–99)
Glucose-Capillary: 150 mg/dL — ABNORMAL HIGH (ref 70–99)
Glucose-Capillary: 228 mg/dL — ABNORMAL HIGH (ref 70–99)
Glucose-Capillary: 293 mg/dL — ABNORMAL HIGH (ref 70–99)
Glucose-Capillary: 241 mg/dL — ABNORMAL HIGH (ref 70–99)
Glucose-Capillary: 265 mg/dL — ABNORMAL HIGH (ref 70–99)
Glucose-Capillary: 286 mg/dL — ABNORMAL HIGH (ref 70–99)
Glucose-Capillary: 240 mg/dL — ABNORMAL HIGH (ref 70–99)

## 2020-04-28 LAB — RENAL FUNCTION PANEL
Albumin: 3 g/dL — ABNORMAL LOW (ref 3.5–5.0)
Anion gap: 20 — ABNORMAL HIGH (ref 5–15)
BUN: 79 mg/dL — ABNORMAL HIGH (ref 8–23)
CO2: 23 mmol/L (ref 22–32)
Calcium: 8.8 mg/dL — ABNORMAL LOW (ref 8.9–10.3)
Chloride: 93 mmol/L — ABNORMAL LOW (ref 98–111)
Creatinine, Ser: 9.39 mg/dL — ABNORMAL HIGH (ref 0.61–1.24)
GFR calc Af Amer: 6 mL/min — ABNORMAL LOW
GFR calc non Af Amer: 5 mL/min — ABNORMAL LOW
Glucose, Bld: 289 mg/dL — ABNORMAL HIGH (ref 70–99)
Phosphorus: 5.1 mg/dL — ABNORMAL HIGH (ref 2.5–4.6)
Potassium: 5.9 mmol/L — ABNORMAL HIGH (ref 3.5–5.1)
Sodium: 136 mmol/L (ref 135–145)

## 2020-04-28 LAB — POCT I-STAT 7, (LYTES, BLD GAS, ICA,H+H)
Acid-Base Excess: 6 mmol/L — ABNORMAL HIGH (ref 0.0–2.0)
Acid-base deficit: 5 mmol/L — ABNORMAL HIGH (ref 0.0–2.0)
Bicarbonate: 21.7 mmol/L (ref 20.0–28.0)
Bicarbonate: 29.8 mmol/L — ABNORMAL HIGH (ref 20.0–28.0)
Calcium, Ion: 1.46 mmol/L — ABNORMAL HIGH (ref 1.15–1.40)
Calcium, Ion: 1.2 mmol/L (ref 1.15–1.40)
HCT: 43 % (ref 39.0–52.0)
HCT: 41 % (ref 39.0–52.0)
Hemoglobin: 14.6 g/dL (ref 13.0–17.0)
Hemoglobin: 13.9 g/dL (ref 13.0–17.0)
O2 Saturation: 95 %
O2 Saturation: 100 %
Patient temperature: 97
Patient temperature: 97.8
Potassium: 7.1 mmol/L (ref 3.5–5.1)
Potassium: 4.3 mmol/L (ref 3.5–5.1)
Sodium: 128 mmol/L — ABNORMAL LOW (ref 135–145)
Sodium: 136 mmol/L (ref 135–145)
TCO2: 23 mmol/L (ref 22–32)
TCO2: 31 mmol/L (ref 22–32)
pCO2 arterial: 42.3 mmHg (ref 32.0–48.0)
pCO2 arterial: 39 mmHg (ref 32.0–48.0)
pH, Arterial: 7.315 — ABNORMAL LOW (ref 7.350–7.450)
pH, Arterial: 7.489 — ABNORMAL HIGH (ref 7.350–7.450)
pO2, Arterial: 77 mmHg — ABNORMAL LOW (ref 83.0–108.0)
pO2, Arterial: 223 mmHg — ABNORMAL HIGH (ref 83.0–108.0)

## 2020-04-28 LAB — CBC
HCT: 41.2 % (ref 39.0–52.0)
HCT: 40.8 % (ref 39.0–52.0)
Hemoglobin: 13.5 g/dL (ref 13.0–17.0)
Hemoglobin: 13.3 g/dL (ref 13.0–17.0)
MCH: 27.6 pg (ref 26.0–34.0)
MCH: 27.6 pg (ref 26.0–34.0)
MCHC: 32.8 g/dL (ref 30.0–36.0)
MCHC: 32.6 g/dL (ref 30.0–36.0)
MCV: 84.3 fL (ref 80.0–100.0)
MCV: 84.6 fL (ref 80.0–100.0)
Platelets: 166 10*3/uL (ref 150–400)
Platelets: 176 10*3/uL (ref 150–400)
RBC: 4.89 MIL/uL (ref 4.22–5.81)
RBC: 4.82 MIL/uL (ref 4.22–5.81)
RDW: 16.8 % — ABNORMAL HIGH (ref 11.5–15.5)
RDW: 16.9 % — ABNORMAL HIGH (ref 11.5–15.5)
WBC: 4.7 10*3/uL (ref 4.0–10.5)
WBC: 3.9 10*3/uL — ABNORMAL LOW (ref 4.0–10.5)
nRBC: 0 % (ref 0.0–0.2)
nRBC: 0 % (ref 0.0–0.2)

## 2020-04-28 LAB — BASIC METABOLIC PANEL WITH GFR
Anion gap: 18 — ABNORMAL HIGH (ref 5–15)
BUN: 61 mg/dL — ABNORMAL HIGH (ref 8–23)
CO2: 23 mmol/L (ref 22–32)
Calcium: 9 mg/dL (ref 8.9–10.3)
Chloride: 93 mmol/L — ABNORMAL LOW (ref 98–111)
Creatinine, Ser: 8.51 mg/dL — ABNORMAL HIGH (ref 0.61–1.24)
GFR calc Af Amer: 7 mL/min — ABNORMAL LOW
GFR calc non Af Amer: 6 mL/min — ABNORMAL LOW
Glucose, Bld: 330 mg/dL — ABNORMAL HIGH (ref 70–99)
Potassium: 6.1 mmol/L — ABNORMAL HIGH (ref 3.5–5.1)
Sodium: 134 mmol/L — ABNORMAL LOW (ref 135–145)

## 2020-04-28 LAB — BETA-HYDROXYBUTYRIC ACID: Beta-Hydroxybutyric Acid: 0.31 mmol/L — ABNORMAL HIGH (ref 0.05–0.27)

## 2020-04-28 LAB — TROPONIN I (HIGH SENSITIVITY)
Troponin I (High Sensitivity): 261 ng/L (ref <18)
Troponin I (High Sensitivity): 288 ng/L (ref <18)

## 2020-04-28 LAB — HEMOGLOBIN A1C
Hgb A1c MFr Bld: 9.6 % — ABNORMAL HIGH (ref 4.8–5.6)
Mean Plasma Glucose: 228.82 mg/dL

## 2020-04-28 LAB — HIV ANTIBODY (ROUTINE TESTING W REFLEX): HIV Screen 4th Generation wRfx: NONREACTIVE

## 2020-04-28 LAB — MRSA PCR SCREENING: MRSA by PCR: NEGATIVE

## 2020-04-28 LAB — MAGNESIUM: Magnesium: 2 mg/dL (ref 1.7–2.4)

## 2020-04-28 LAB — OSMOLALITY: Osmolality: 353 mOsm/kg (ref 275–295)

## 2020-04-28 LAB — TRIGLYCERIDES
Triglycerides: 58 mg/dL (ref <150)
Triglycerides: 85 mg/dL (ref <150)

## 2020-04-28 LAB — TSH: TSH: 2.43 u[IU]/mL (ref 0.350–4.500)

## 2020-04-28 MED ORDER — VITAL 1.5 CAL PO LIQD
1000.0000 mL | ORAL | Status: DC
  Administered 2020-04-28 – 2020-05-09 (×10): 1000 mL
  Filled 2020-04-28 (×19): qty 1000

## 2020-04-28 MED ORDER — INSULIN ASPART 100 UNIT/ML ~~LOC~~ SOLN: 0.0000 [IU] | SUBCUTANEOUS | Status: DC

## 2020-04-28 MED ORDER — VANCOMYCIN HCL IN DEXTROSE 750-5 MG/150ML-% IV SOLN
750.0000 mg | INTRAVENOUS | Status: DC
  Administered 2020-04-29: 750 mg via INTRAVENOUS
  Filled 2020-04-28 (×2): qty 150

## 2020-04-28 MED ORDER — LEVETIRACETAM IN NACL 1000 MG/100ML IV SOLN
1000.0000 mg | INTRAVENOUS | Status: DC
  Administered 2020-04-28 – 2020-05-11 (×14): 1000 mg via INTRAVENOUS
  Filled 2020-04-28 (×15): qty 100

## 2020-04-28 MED ORDER — CLONIDINE HCL 0.3 MG PO TABS
0.3000 mg | ORAL_TABLET | Freq: Two times a day (BID) | ORAL | Status: DC
  Administered 2020-04-28 (×2): 0.3 mg
  Filled 2020-04-28 (×2): qty 1

## 2020-04-28 MED ORDER — LORAZEPAM 2 MG/ML IJ SOLN
2.0000 mg | INTRAMUSCULAR | Status: AC | PRN
  Administered 2020-04-29 (×2): 2 mg via INTRAVENOUS
  Filled 2020-04-28 (×2): qty 1

## 2020-04-28 MED ORDER — CHLORHEXIDINE GLUCONATE CLOTH 2 % EX PADS
6.0000 | MEDICATED_PAD | Freq: Every day | CUTANEOUS | Status: DC
  Administered 2020-04-28: 6 via TOPICAL

## 2020-04-28 MED ORDER — METOPROLOL TARTRATE 100 MG PO TABS
100.0000 mg | ORAL_TABLET | Freq: Two times a day (BID) | ORAL | Status: DC
  Administered 2020-04-28 (×2): 100 mg
  Filled 2020-04-28 (×4): qty 1

## 2020-04-28 MED ORDER — SODIUM CHLORIDE 0.9 % IV SOLN
1.0000 g | INTRAVENOUS | Status: DC
  Filled 2020-04-28: qty 1

## 2020-04-28 MED ORDER — SODIUM POLYSTYRENE SULFONATE 15 GM/60ML PO SUSP
30.0000 g | Freq: Once | ORAL | Status: AC
  Administered 2020-04-28: 30 g via ORAL
  Filled 2020-04-28: qty 120

## 2020-04-28 MED ORDER — PRO-STAT SUGAR FREE PO LIQD
30.0000 mL | Freq: Three times a day (TID) | ORAL | Status: DC
  Administered 2020-04-28 – 2020-05-12 (×42): 30 mL
  Filled 2020-04-28 (×43): qty 30

## 2020-04-28 MED ORDER — ACETAMINOPHEN 160 MG/5ML PO SOLN
650.0000 mg | Freq: Four times a day (QID) | ORAL | Status: DC | PRN
  Administered 2020-04-28: 650 mg
  Filled 2020-04-28: qty 20.3

## 2020-04-28 MED ORDER — ORAL CARE MOUTH RINSE: 15.0000 mL | OROMUCOSAL | Status: DC

## 2020-04-28 MED ORDER — LEVETIRACETAM IN NACL 500 MG/100ML IV SOLN: 500.0000 mg | Freq: Two times a day (BID) | INTRAVENOUS | Status: DC

## 2020-04-28 MED ORDER — INSULIN DETEMIR 100 UNIT/ML ~~LOC~~ SOLN
10.0000 [IU] | Freq: Two times a day (BID) | SUBCUTANEOUS | Status: DC
  Administered 2020-04-28 (×2): 10 [IU] via SUBCUTANEOUS
  Filled 2020-04-28 (×4): qty 0.1

## 2020-04-28 MED ORDER — LORAZEPAM 2 MG/ML IJ SOLN
INTRAMUSCULAR | Status: AC
  Administered 2020-04-28: 2 mg via INTRAVENOUS
  Filled 2020-04-28: qty 1

## 2020-04-28 MED ORDER — AMLODIPINE BESYLATE 10 MG PO TABS
10.0000 mg | ORAL_TABLET | Freq: Every day | ORAL | Status: DC
  Administered 2020-04-28: 10 mg
  Filled 2020-04-28: qty 1

## 2020-04-28 MED ORDER — SODIUM POLYSTYRENE SULFONATE 15 GM/60ML PO SUSP
15.0000 g | Freq: Once | ORAL | Status: DC
  Filled 2020-04-28: qty 60

## 2020-04-28 MED ORDER — INSULIN DETEMIR 100 UNIT/ML ~~LOC~~ SOLN
15.0000 [IU] | Freq: Every day | SUBCUTANEOUS | Status: DC
  Filled 2020-04-28: qty 0.15

## 2020-04-28 MED ORDER — LEVETIRACETAM IN NACL 1000 MG/100ML IV SOLN
1000.0000 mg | Freq: Once | INTRAVENOUS | Status: AC
  Administered 2020-04-28: 1000 mg via INTRAVENOUS
  Filled 2020-04-28: qty 100

## 2020-04-28 MED ORDER — SODIUM CHLORIDE 0.9 % IV SOLN
2.0000 g | Freq: Once | INTRAVENOUS | Status: AC
  Administered 2020-04-29: 2 g via INTRAVENOUS
  Filled 2020-04-28: qty 2

## 2020-04-28 MED ORDER — HYDRALAZINE HCL 50 MG PO TABS
100.0000 mg | ORAL_TABLET | Freq: Three times a day (TID) | ORAL | Status: DC
  Administered 2020-04-28 (×2): 100 mg
  Filled 2020-04-28 (×2): qty 2

## 2020-04-28 MED ORDER — VANCOMYCIN HCL 1500 MG/300ML IV SOLN
1500.0000 mg | Freq: Once | INTRAVENOUS | Status: AC
  Administered 2020-04-29: 1500 mg via INTRAVENOUS
  Filled 2020-04-28: qty 300

## 2020-04-28 MED ORDER — CHLORHEXIDINE GLUCONATE 0.12% ORAL RINSE (MEDLINE KIT)
15.0000 mL | Freq: Two times a day (BID) | OROMUCOSAL | Status: DC
  Administered 2020-04-28 – 2020-05-12 (×28): 15 mL via OROMUCOSAL

## 2020-04-28 MED ORDER — INSULIN ASPART 100 UNIT/ML ~~LOC~~ SOLN
0.0000 [IU] | SUBCUTANEOUS | Status: DC
  Administered 2020-04-28: 5 [IU] via SUBCUTANEOUS
  Administered 2020-04-28: 8 [IU] via SUBCUTANEOUS
  Administered 2020-04-29: 5 [IU] via SUBCUTANEOUS
  Administered 2020-04-29 (×3): 3 [IU] via SUBCUTANEOUS
  Administered 2020-04-29: 5 [IU] via SUBCUTANEOUS
  Administered 2020-04-30: 2 [IU] via SUBCUTANEOUS
  Administered 2020-04-30: 5 [IU] via SUBCUTANEOUS
  Administered 2020-04-30 (×2): 2 [IU] via SUBCUTANEOUS
  Administered 2020-05-01: 3 [IU] via SUBCUTANEOUS
  Administered 2020-05-01 (×2): 2 [IU] via SUBCUTANEOUS
  Administered 2020-05-01 (×2): 3 [IU] via SUBCUTANEOUS
  Administered 2020-05-01: 5 [IU] via SUBCUTANEOUS
  Administered 2020-05-01: 8 [IU] via SUBCUTANEOUS
  Administered 2020-05-02: 2 [IU] via SUBCUTANEOUS
  Administered 2020-05-02: 5 [IU] via SUBCUTANEOUS
  Administered 2020-05-02 (×2): 8 [IU] via SUBCUTANEOUS
  Administered 2020-05-02: 2 [IU] via SUBCUTANEOUS
  Administered 2020-05-03: 3 [IU] via SUBCUTANEOUS
  Administered 2020-05-03 (×2): 5 [IU] via SUBCUTANEOUS
  Administered 2020-05-03: 3 [IU] via SUBCUTANEOUS
  Administered 2020-05-03: 5 [IU] via SUBCUTANEOUS
  Administered 2020-05-03 – 2020-05-05 (×6): 3 [IU] via SUBCUTANEOUS
  Administered 2020-05-05: 5 [IU] via SUBCUTANEOUS
  Administered 2020-05-05 (×2): 3 [IU] via SUBCUTANEOUS
  Administered 2020-05-05: 11 [IU] via SUBCUTANEOUS
  Administered 2020-05-06: 2 [IU] via SUBCUTANEOUS

## 2020-04-28 MED ORDER — INSULIN ASPART 100 UNIT/ML ~~LOC~~ SOLN
0.0000 [IU] | SUBCUTANEOUS | Status: DC
  Administered 2020-04-28: 5 [IU] via SUBCUTANEOUS

## 2020-04-28 NOTE — Progress Notes (Signed)
[facility] Progress Note  Subjective: pt seen in iCU, on vent and sedated  Vitals:   04/28/20 0754 04/28/20 0800 04/28/20 1015 04/28/20 1023  BP:  (!) 170/88 (!) 169/85   Pulse:  92    Resp: (!) 21 (!) 23    Temp:      TempSrc:      SpO2: 98% 99%  98%  Weight:      Height:        Exam:   On vent and sedated  no jvd  Chest cta bilat  Cor reg no RG  Abd soft ntnd no ascites   Ext no LE edema   Alert, NF, ox3   LUA aVF+bruit    OP HD: TTS NW  4h  400/800  2/2 bath 72.5kg  Hep 2300   - hect 7 ug   Assessment/ Plan: 1. Hyperkalemia: sp acute HD early am today, K down 4.0, B/Cr stabilized.  2. Acute hypoxic resp failure: On Mech vent now. CXR appears with overload. Only got 0.5 L off w/ HD overnight due to BP drop unfortunately. Check trop. HD tomorrow.   3. HTN/ volume: BP's high then low, no normal. Up 4kg post HD this am.  4. AMS: further eval per primary team. Uremia possibility as B/Cr 121/ 13 on admit 5. ESRD - on HD TTS. Had HD last night, labs much better 6. Secondary hyperPTH: cont outpt hectorol dose      Rob Suhail Peloquin 04/28/2020, 10:36 AM   Recent Labs  Lab 04/28/20 0130 04/28/20 0500 04/28/20 0700  K 6.6*  --  4.0  BUN 121*  --  40*  CREATININE 13.02*  --  5.74*  CALCIUM 10.8*  --  9.7  HGB 13.5 13.3  --    Inpatient medications: . amLODipine  10 mg Per Tube Daily  . calcium gluconate  1 g Intravenous Once  . chlorhexidine gluconate (MEDLINE KIT)  15 mL Mouth Rinse BID  . Chlorhexidine Gluconate Cloth  6 each Topical Q0600  . cloNIDine  0.3 mg Per Tube BID  . docusate  100 mg Oral BID  . heparin  5,000 Units Subcutaneous Q8H  . insulin aspart  0-9 Units Subcutaneous Q4H  . insulin detemir  10 Units Subcutaneous BID  . mouth rinse  15 mL Mouth Rinse 10 times per day  . pantoprazole (PROTONIX) IV  40 mg Intravenous QHS  . polyethylene glycol  17 g Oral Daily  . sodium bicarbonate  50 mEq Intravenous Once  . sodium zirconium  cyclosilicate  10 g Oral Once   . levETIRAcetam Stopped (05/09/2020 2314)  . propofol (DIPRIVAN) infusion 40 mcg/kg/min (04/28/20 0800)   dextrose, docusate sodium, fentaNYL (SUBLIMAZE) injection, fentaNYL (SUBLIMAZE) injection, hydrALAZINE, polyethylene glycol

## 2020-04-28 NOTE — Progress Notes (Signed)
[facility] Progress Note Patient Name: Jared [name]. DOB: [birth year] MRN: [id number]   Date of Service  04/28/2020  HPI/Events of Note  Fever to 101.8 F - Patient not on antibiotics. AST and ALT both normal.   eICU Interventions  Will order: 1. Blood cultures X 2 now.  2. Tracheal aspirate culture now.  3. UA with reflex microscopic now.  4. Vancomycin and Cefepime per pharmacy consult. 5. Tylenol liquid 650 mg per tube Q 6 hours PRN Temp > 101.0 F.     Intervention Category Major Interventions: Infection - evaluation and management  Nels Munn [place] 04/28/2020, 11:02 PM

## 2020-04-28 NOTE — Progress Notes (Addendum)
NAME:  Jared [name], [place] MRN:  [id number], DOB:  [birth year], LOS: 1 ADMISSION DATE:  04/30/2020, CONSULTATION DATE: 6/1 REFERRING MD: Dr. Tyrone Nine EDP, CHIEF COMPLAINT: Unresponsiveness  Brief History   64 year old male with ESRD on HD unresponsive from home.  Intubated in ED.  Has missed 2 dialysis sessions.  Hyperkalemic and volume overloaded in the ED.  Admitted to ICU for emergent hemodialysis.  History of present illness   64 year old male with past medical history as below, which is significant for ESRD on HD TTS, diabetes, hypertension, hypoglycemic seizure, and stroke.  His last dialysis treatment was on Saturday 5/29, but was abbreviated.  After his dialysis session he went to [facility] with his sisters to celebrate his birthday.  His family worries he may have overdone it and not adhere to his renal diet or fluid restrictions.  Since returning home to [facility] he has complained of nausea, vomiting, and diarrhea.  The symptoms prevented him from going to dialysis on 6/1 and it was rescheduled for 6/2. In the late evening hours of 6/1 he was found at home to be unresponsive.  EMS was called.  Upon their arrival and learning his medical history he was treated empirically for hyperkalemia and was transported to the emergency department.  He remained unresponsive and was intubated for airway protection.  Laboratory evaluation significant for potassium greater than 7.5, bicarb 18, glucose 565, creatinine 12.6, and BUN 125.  He was also profoundly hypertensive with systolic blood pressures up to the 220s.  Nephrology evaluated in the emergency department and is planning for emergent dialysis.  PCCM asked to admit to ICU.  Past Medical History   has a past medical history of Anemia, Cancer ([place]), Chronic kidney disease, Diabetes mellitus without complication ([place]), GERD (gastroesophageal reflux disease), Hypertension, Seizures ([place]), Stroke ([place]), and Thyroid disease.  Significant Hospital Events     6/1 admit for emergent dialysis  Consults:  Nephrology  Procedures:  ETT 6/1>  Significant Diagnostic Tests:  CT head 6/1 >  Micro Data:  6/1 SARS Cov2 > neg  6/1 MRSA> neg  Antimicrobials:     Interim history/subjective:  Received emergent HD overnight for K > 7.5 FiO2 weaned to 40% following HD, PEEP at 8  Hyperglycemia has improved although insulin gtt not initiated   Objective   Blood pressure (!) 142/84, pulse 86, temperature 98.1 F (36.7 C), temperature source Oral, resp. rate (!) 21, height 5\' 8"  (1.727 m), weight 76.6 kg, SpO2 100 %.    Vent Mode: PRVC FiO2 (%):  [40 %-100 %] 40 % Set Rate:  [18 bmp] 18 bmp Vt Set:  [550 mL] 550 mL PEEP:  [5 cmH20] 5 cmH20 Plateau Pressure:  [20 cmH20-22 cmH20] 20 cmH20   Intake/Output Summary (Last 24 hours) at 04/28/2020 0728 Last data filed at 04/28/2020 0440 Gross per 24 hour  Intake 106.39 ml  Output 800 ml  Net -693.61 ml   Filed Weights   05/03/2020 2110 05/09/2020 2316 04/28/20 0125  Weight: 72.5 kg 76.6 kg 76.6 kg    Examination: General: Chronically ill appearing M, appears older than stated age, intubated sedated NAD  HENT: NCAT ETT OGT secure pink mmm Anicteric sclera  Lungs: Symmetrical chest expansion. Scattered crackles bilaterally. No accessory muscle use  Cardiovascular: Tachycardic rate regular rhythm, s1s2 no rgm. 2+ peripheral pulses  Abdomen: soft round ndnt. Normoactive  Extremities: LUE fistula with + thrill. No obvious joint deformity. No cyanosis or clubbing  Neuro: Sedated, grimaces and localizes to  pain PERRL 39mm sluggish   Resolved Hospital Problem list     Assessment & Plan:   Acute metabolic encephalopathy  Secondary to acidosis, uremia, and likely hypoxemia as well P -RASS goal 0 to -1 -Prop + PRN fent  -metabolic correction as able, improved uremia, hypoxia, metabolic abnormalities after HD  Hx seizure - Cont home keppra     Acute hypoxemic respiratory failure:  -Pulmonary  edema, non-cardiogenic,  in setting of missed HD. Interval improvement on 6/2 CXR following overnight HD P -Cont MV support, wean PEEP/FiO2 as able  -goal WUA/SBT qAM when tolerating decreased vent settings -VAP  -volume removal via HD, per nephro   ESRD on HD -missed HD sessions outpt  Hyperkalemia, initially > 7.5 P -Nephrology following -HD per nephro -trend electrolytes, renal indices   Hypertensive crisis, SBP 220 in ICU  -home meds: clonidine 0.3mg  BID, norvasc 10mg  qD, hydral 100mg  TID, imdur 30mg  qD, lopressor 10mg  BID  P  - ICU monitoring, goal SBP 160 - hydral PRN   - adding back home clonidine and norvasc, plan to gradually add back other home meds listed above  - CT H still pending   Diabetes mellitus, poorly  controlled:  -A1c 9.6 -With hyperglycemia to 565 on admission. Improved without insulin gtt now glucose 136 -Beta hydroxybutyric acid marginally elevated at 0.31, however AG now 16 without insulin gtt  P  - appreciate DM coordinator recs: levemir 10 units BID + sensitive SSI     Secondary hyperparathyroidism -TSH 2.43 on admit P  - no home medication on med list  Inadequate PO intake -intubated sedated, low-dose prop P -EN per RDN   Best practice:  Diet: NPO Pain/Anxiety/Delirium protocol (if indicated): Propofol, PRN fent  VAP protocol (if indicated): Per protocol DVT prophylaxis: SQH GI prophylaxis: PPI Glucose control: SSI  Mobility: BR Code Status: FULL Family Communication: Pending 6/2  Disposition: ICU  Labs   CBC: Recent Labs  Lab 05/14/2020 2024 04/28/20 0130 04/28/20 0500  WBC 5.3 4.7 3.9*  NEUTROABS 4.3  --   --   HGB 14.3 13.5 13.3  HCT 45.1 41.2 40.8  MCV 86.2 84.3 84.6  PLT 142* 166 161    Basic Metabolic Panel: Recent Labs  Lab 05/07/2020 2024 04/28/20 0130  NA 130* 134*  K >7.5* 6.6*  CL 86* 92*  CO2 18* 21*  GLUCOSE 565* 304*  BUN 125* 121*  CREATININE 12.60* 13.02*  CALCIUM 10.3 10.8*   GFR: Estimated  Creatinine Clearance: 5.4 mL/min (A) (by C-G formula based on SCr of 13.02 mg/dL (H)). Recent Labs  Lab 04/30/2020 2024 04/28/20 0130 04/28/20 0500  WBC 5.3 4.7 3.9*    Liver Function Tests: Recent Labs  Lab 04/29/2020 2024  AST 30  ALT 20  ALKPHOS 94  BILITOT 1.3*  PROT 7.3  ALBUMIN 3.9   No results for input(s): LIPASE, AMYLASE in the last 168 hours. No results for input(s): AMMONIA in the last 168 hours.  ABG    Component Value Date/Time   PHART 7.365 04/29/2018 0731   PCO2ART 39.5 04/29/2018 0731   PO2ART 134.0 (H) 04/29/2018 0731   HCO3 22.7 04/29/2018 0731   TCO2 24 04/29/2018 0731   ACIDBASEDEF 3.0 (H) 04/29/2018 0731   O2SAT 99.0 04/29/2018 0731     Coagulation Profile: No results for input(s): INR, PROTIME in the last 168 hours.  Cardiac Enzymes: No results for input(s): CKTOTAL, CKMB, CKMBINDEX, TROPONINI in the last 168 hours.  HbA1C: Hemoglobin A1C  Date/Time  Value Ref Range Status  10/30/2018 04:39 PM 7.2 (A) 4.0 - 5.6 % Final   Hgb A1c MFr Bld  Date/Time Value Ref Range Status  04/28/2020 01:30 AM 9.6 (H) 4.8 - 5.6 % Final    Comment:    (NOTE) Pre diabetes:          5.7%-6.4% Diabetes:              >6.4% Glycemic control for   <7.0% adults with diabetes   02/13/2018 02:08 PM 6.3 (H) 4.8 - 5.6 % Final    Comment:    (NOTE) Pre diabetes:          5.7%-6.4% Diabetes:              >6.4% Glycemic control for   <7.0% adults with diabetes     CBG: No results for input(s): GLUCAP in the last 168 hours.  CRITICAL CARE Performed by: Cristal Generous   Total critical care time: 45 minutes  Critical care time was exclusive of separately billable procedures and treating other patients. Critical care was necessary to treat or prevent imminent or life-threatening deterioration.  Critical care was time spent personally by me on the following activities: development of treatment plan with patient and/or surrogate as well as nursing, discussions  with consultants, evaluation of patient's response to treatment, examination of patient, obtaining history from patient or surrogate, ordering and performing treatments and interventions, ordering and review of laboratory studies, ordering and review of radiographic studies, pulse oximetry and re-evaluation of patient's condition.  Eliseo Gum MSN, AGACNP-BC [place] [phone] If no answer, [phone] 04/28/2020, 7:29 AM

## 2020-04-28 NOTE — Progress Notes (Signed)
PCCM family communication note  Attempted to reach family at numbers listed in chart, directed to [place] MSN, AGACNP-BC [place] [phone] If no answer, [phone] 04/28/2020, 2:01 PM

## 2020-04-28 NOTE — Progress Notes (Cosign Needed Addendum)
NOT FOR USE IN MEDICAL RECORD. EDUCATIONAL USE ONLY.  NAME:  Jared [name], Jared [name] MRN:  [id number], DOB:  09/05/1956, LOS: 1 ADMISSION DATE:  05/10/2020, CONSULTATION DATE:  05/07/2020 REFERRING MD:  Dr, Tyrone Nine, CHIEF COMPLAINT:  Unresponsiveness   Brief History   64 year old male with PMH significant for ESRD on HD presented to ED for unresponsiveness from home. He has missed 2 dialysis sessions. He was intubated upon arrival. Initial work-up notable for hyperkalemia and volume overload. He was transferred to the ICU for emergent HD.  History of present illness   Jared [name] is a 64 year old male with a PMH significant for ESRD on HD Tuesday, Thursday, and Saturday, DM, HTN, seizures, and stroke. He last received HD on 5/29 and afterward celebrated his birthday with his family. His family reported concerns that he did not adhere to his renal diet or fluid restrictions and had since been complaining of nausea, vomiting, and diarrhea. Due to these symptoms, he missed his HD session on 6/1 and was found unresponsive later than evening. Upon arrival, EMS treated empirically for hyperkalemia with IV calcium, HCO3, and lidocaine and he was transported to the ED. He was unresponsive upon arrival to the ED and was intubated for airway protection. Admission workup notable for hyperkalemia, hyperglycemia, and hypertension with SBP's in the 220's. Nephrology was consulted and he was admitted to the ICU for emergent HD.   Past Medical History  ESRD - HD Tuesday, Thursday, Saturday GERD CVA Prostate cancer DM II HTN Seizures Hyperparathyroidism  Significant Hospital Events   6/1 Admitted to ICU for emergent HD  Consults:  6/1 Nephrology  Procedures:  6/1 ETT >>   Significant Diagnostic Tests:  6/1 CT Head >>  6/2 CXR >> Interval improvement in vascular congestion and pulmonary edema. ETT in satisfactory position, approximately 3cm from carina. Gastric tube terminates below diaphragm. No acute  bony abnormalities seen.   Micro Data:  6/1 MRSA PCR >> Negative 6/1 SARS CoV2 >> Negative   Antimicrobials:  N/A  Interim history/subjective:  6/2 Received emergent HD this morning for hyperkalemia with potassium level improvement to 4.0 and improvement in HTN.   Objective   Blood pressure (!) 142/84, pulse 86, temperature 98.1 F (36.7 C), temperature source Oral, resp. rate (!) 21, height 5\' 8"  (1.727 m), weight 76.6 kg, SpO2 100 %.    Vent Mode: PRVC FiO2 (%):  [40 %-100 %] 40 % Set Rate:  [18 bmp] 18 bmp Vt Set:  [550 mL] 550 mL PEEP:  [5 cmH20] 5 cmH20 Plateau Pressure:  [20 cmH20-22 cmH20] 20 cmH20   Intake/Output Summary (Last 24 hours) at 04/28/2020 0727 Last data filed at 04/28/2020 0440 Gross per 24 hour  Intake 106.39 ml  Output 800 ml  Net -693.61 ml   Filed Weights   05/25/2020 2110 05/04/2020 2316 04/28/20 0125  Weight: 72.5 kg 76.6 kg 76.6 kg    Examination: General: Elderly black male laying in bed in no acute distress and on MV HENT: Non-icteric sclera. No corneal reflex. MMM. Lungs: Bilateral lungs CTAB. Equal chest rise and fall on MV. No crepitus. No accessory muscle use. Cardiovascular: Normal S1/S2. No murmurs, rubs, or gallops. No JVD. Bilateral radial and DP pulses intact Abdomen: Not distended. Soft. Bowel sounds present Extremities: No obvious joint deformity. Normal muscle bulk and tone. No peripheral edema.  Neuro: Sedated on propofol. Not following commands. PERRL. Does not withdraw to pain. GCS 3T GU: Condom catheter in place.   [facility]  Problem list   Nausea and vomiting  Assessment & Plan:  Acute hypoxic respiratory failure  Likely secondary to pulmonary edema from volume overload due to missing HD.  -Lung-protective ventilation strategy -TV ~58mL/kg, PEEP 8. Keep SpO2 > 92% -SAT/SBT. Wean sedation and assess for readiness to extubate  Hyperkalemia secondary to ESRD Initial potassium >7.5 now improved to 4.0 following HD.    -Received emergent HD this morning -Check BMP this afternoon -Continuous cardiac monitoring -Renal diet when extubated and able to take PO  Hypertensive Emergency Improving after HD. Likely secondary to volume overload due to missed HD in the setting of ESRD.  -Hydralazine PRN for SBP > 170. -Resume home amlodipine and clonidine  Acute metabolic encephalopathy Likely secondary to uremia and acidosis in the setting of missed HD. CT Head ordered but not obtained yet.  -Switch from propofol to dexmedetomidine -Continue to wean sedation to obtain RASS 0 to -1. Would hold off on CT Head until after weaning sedation.  -IV Fentanyl PRN for pain -Avoid additional sedating medications  ESRD on HD - TTS -Emergent HD this morning -Resume normal HD schedule per Nephrology recs -Avoid nephrotoxins -Heparin for DVT/PE prophylaxis -Check Vitamin D level  DM Initial presenting glucose was 565. A1c 9.6. BG improved after HD without requiring insulin drip. On insulin therapy at home. -SSI -Start long-acting glargine -BG q4h -Consult diabetes coordinator  History of seizures No witnessed seizure-like activity reported. Levetiracetam at home. -Continue IV Keppra   Hypercalcemia secondary to secondary hyperparathyroidism -Check phos and PTH -Calcimimetic?   Best practice:  Diet: NPO2 Pain/Anxiety/Delirium protocol (if indicated): Propofol VAP protocol (if indicated): Yes DVT prophylaxis: Heparin SQ GI prophylaxis: Protonix  Glucose control: SSI Mobility: Bedrest Code Status: Full Family Communication: Pending Disposition: ICU  Labs   CBC: Recent Labs  Lab 05/24/2020 2024 04/28/20 0130 04/28/20 0500  WBC 5.3 4.7 3.9*  NEUTROABS 4.3  --   --   HGB 14.3 13.5 13.3  HCT 45.1 41.2 40.8  MCV 86.2 84.3 84.6  PLT 142* 166 315    Basic Metabolic Panel: Recent Labs  Lab 05/26/2020 2024 04/28/20 0130  NA 130* 134*  K >7.5* 6.6*  CL 86* 92*  CO2 18* 21*  GLUCOSE 565* 304*   BUN 125* 121*  CREATININE 12.60* 13.02*  CALCIUM 10.3 10.8*   GFR: Estimated Creatinine Clearance: 5.4 mL/min (A) (by C-G formula based on SCr of 13.02 mg/dL (H)). Recent Labs  Lab 05/24/2020 2024 04/28/20 0130 04/28/20 0500  WBC 5.3 4.7 3.9*    Liver Function Tests: Recent Labs  Lab 05/07/2020 2024  AST 30  ALT 20  ALKPHOS 94  BILITOT 1.3*  PROT 7.3  ALBUMIN 3.9   No results for input(s): LIPASE, AMYLASE in the last 168 hours. No results for input(s): AMMONIA in the last 168 hours.  ABG    Component Value Date/Time   PHART 7.365 04/29/2018 0731   PCO2ART 39.5 04/29/2018 0731   PO2ART 134.0 (H) 04/29/2018 0731   HCO3 22.7 04/29/2018 0731   TCO2 24 04/29/2018 0731   ACIDBASEDEF 3.0 (H) 04/29/2018 0731   O2SAT 99.0 04/29/2018 0731     Coagulation Profile: No results for input(s): INR, PROTIME in the last 168 hours.  Cardiac Enzymes: No results for input(s): CKTOTAL, CKMB, CKMBINDEX, TROPONINI in the last 168 hours.  HbA1C: Hemoglobin A1C  Date/Time Value Ref Range Status  10/30/2018 04:39 PM 7.2 (A) 4.0 - 5.6 % Final   Hgb A1c MFr Bld  Date/Time Value  Ref Range Status  04/28/2020 01:30 AM 9.6 (H) 4.8 - 5.6 % Final    Comment:    (NOTE) Pre diabetes:          5.7%-6.4% Diabetes:              >6.4% Glycemic control for   <7.0% adults with diabetes   02/13/2018 02:08 PM 6.3 (H) 4.8 - 5.6 % Final    Comment:    (NOTE) Pre diabetes:          5.7%-6.4% Diabetes:              >6.4% Glycemic control for   <7.0% adults with diabetes     CBG: No results for input(s): GLUCAP in the last 168 hours.  Review of Systems:   Unable to obtain secondary to intubation and sedation  Past Medical History  He,  has a past medical history of Anemia, Cancer ([place]), Chronic kidney disease, Diabetes mellitus without complication ([place]), GERD (gastroesophageal reflux disease), Hypertension, Seizures ([place]), Stroke ([place]), and Thyroid disease.   Surgical History    Past  Surgical History:  Procedure Laterality Date  . AV FISTULA PLACEMENT Left 06/04/2014   Procedure: ARTERIOVENOUS (AV) FISTULA CREATION-  BRACHIOCEPHALIC WITH LIGATION OF COMPETEING BRANCH;  Surgeon: Mal Misty, MD;  Location: [facility];  Service: Vascular;  Laterality: Left;  . INGUINAL HERNIA REPAIR Left 02/19/2018   Procedure: LAPAROSCOPIC LEFT  INGUINAL HERNIA;  Surgeon: Ralene Ok, MD;  Location: [facility];  Service: General;  Laterality: Left;  . INSERTION OF DIALYSIS CATHETER  04/2014  . INSERTION OF MESH Left 02/19/2018   Procedure: INSERTION OF MESH;  Surgeon: Ralene Ok, MD;  Location: [facility];  Service: General;  Laterality: Left;  . IR GENERIC HISTORICAL Left 11/30/2016   IR THROMBECTOMY AV FISTULA W/THROMBOLYSIS/PTA/STENT INC/SHUNT/IMG LT 11/30/2016 Markus Daft, MD [facility]-INTERV [facility]  . IR GENERIC HISTORICAL  11/30/2016   IR US GUIDE VASC ACCESS LEFT 11/30/2016 Markus Daft, MD [facility]INTER[facility]  . IR PARACENTESIS  08/21/2018     Social History   reports that he quit smoking about 35 years ago. He has never used smokeless tobacco. He reports current alcohol use of about 1.0 standard drinks of alcohol per week. He reports that he does not use drugs.   Family History   His family history includes Cancer - Lung in his mother; Cancer - Other in his father. There is no history of Diabetes.   Allergies Allergies  Allergen Reactions  . Penicillins Other (See Comments)    Tolerates cephalosporins  UNSPECIFIED REACTION  Has patient had a PCN reaction causing immediate rash, facial/tongue/throat swelling, SOB or lightheadedness with hypotension: Unk Has patient had a PCN reaction causing severe rash involving mucus membranes or skin necrosis: Unk Has patient had a PCN reaction that required hospitalization: Unk Has patient had a PCN reaction occurring within the last 10 years: Unk If all of the above answers are "NO", then may proceed with Cephalosporin use.      Home Medications  Prior to Admission  medications   Medication Sig Start Date End Date Taking? Authorizing Provider  amLODipine (NORVASC) 10 MG tablet Take 1 tablet (10 mg total) by mouth daily. 12/02/17   Sheikh, Georgina Quint Latif, DO  atorvastatin (LIPITOR) 20 MG tablet TAKE 1 TABLET BY MOUTH ONCE DAILY 09/06/18   Henson, Vickie L, NP-C  b complex-vitamin c-folic acid (NEPHRO-VITE) 0.8 MG TABS tablet TAKE 1 TABLET BY MOUTH EVERY DAY (ON DIALYSIS DAYS, TAKE AFTER DIALYSIS  TREATMENT) 09/25/18   [provider]  cinacalcet (SENSIPAR) 30 MG tablet Take 30 mg by mouth daily. 04/25/18   [provider]  cloNIDine (CATAPRES) 0.3 MG tablet Take 0.3 mg by mouth 2 (two) times daily. as directed 04/24/18   [provider]  hydrALAZINE (APRESOLINE) 100 MG tablet Take 1 tablet (100 mg total) by mouth 3 (three) times daily. 03/27/17   Angiulli, Lavon Paganini, PA-C  insulin NPH Human (NOVOLIN N) 100 UNIT/ML injection Inject 20 units every morning Patient taking differently: Inject 30 Units into the skin daily before breakfast.  04/03/17   Renato Shin, MD  isosorbide mononitrate (IMDUR) 30 MG 24 hr tablet Take 1 tablet by mouth once daily 04/04/19   Raenette Rover, Vickie L, NP-C  levETIRAcetam (KEPPRA) 250 MG tablet Take 1 tablet by mouth twice daily 09/03/19   Frann Rider, NP  metoprolol tartrate (LOPRESSOR) 100 MG tablet Take 1 tablet (100 mg total) by mouth 2 (two) times daily. 05/21/17   Henson, Vickie L, NP-C  ondansetron (ZOFRAN ODT) 4 MG disintegrating tablet Take 1 tablet (4 mg total) by mouth every 8 (eight) hours as needed for nausea or vomiting. 10/23/18   Virgel Manifold, MD  pantoprazole (PROTONIX) 40 MG tablet TAKE 1 TABLET BY MOUTH ONCE DAILY AT 6AM Patient taking differently: TAKE 1 TABLET BY MOUTH twice DAILy 02/01/18   Henson, Vickie L, NP-C  promethazine-dextromethorphan (PROMETHAZINE-DM) 6.25-15 MG/5ML syrup TAKE 5 MLS BY MOUTH 4 TIMES DAILY AS NEEDED 10/21/18   [provider]  sevelamer carbonate (RENVELA) 800 MG tablet  Take 3 tablets (2,400 mg total) by mouth 3 (three) times daily with meals. 03/27/17   Angiulli, Lavon Paganini, PA-C     Critical care time: 70 minutes    Electronically signed by: Mamie Nick. Emogene Morgan Student, 04/28/20 [phone]

## 2020-04-28 NOTE — Progress Notes (Signed)
Pt transported to CT via ventilator with no complication noted.

## 2020-04-28 NOTE — Progress Notes (Addendum)
Inpatient Diabetes Program Recommendations  AACE/ADA: New Consensus Statement on Inpatient Glycemic Control (2015)  Target Ranges:  Prepandial:   less than 140 mg/dL      Peak postprandial:   less than 180 mg/dL (1-2 hours)      Critically ill patients:  140 - 180 mg/dL   Lab Results  Component Value Date   GLUCAP 300 (H) 09/27/2019   HGBA1C 9.6 (H) 04/28/2020    Diabetes history:  DM2  Outpatient Diabetes medications:  NPH 30 units daily with breakfast  Current orders for Inpatient glycemic control:  Novolog 0-15 units q4h  Inpatient Diabetes Program Recommendations:     Please consider ICU glycemic control order set Given renal status consider, Novolog sensitive 1-2-3 correction scale Levemir 10 units bid  Thank you, Reche Dixon, RN, BSN Diabetes Coordinator Inpatient Diabetes Program [phone] (team pager from 8a-5p)

## 2020-04-28 NOTE — Progress Notes (Signed)
EEG complete - results pending 

## 2020-04-28 NOTE — Progress Notes (Signed)
PCCM Interval Note  Called to bedside for concern of seizure activity. Of note pt with hx seizure and takes Keppra at home.  Pt admitted with AMS-- ESRD, HTN crisis. Received HD overnight and BP improved today. CT H 6/2 without acute abnormality. Pt remains intubated and on low-dose propofol at time of exam, and has remained encephalopathic since admission.  Upon arrival, pt with lip smacking movement. Reportedly, horizontal nystagmus preceded this however this ceased prior to my arrival. All movement self limited during my exam without administration of PRN BZD. However, at 1736 pt with LUE rhythmic jerking movement. 2mg  ativan administered, limiting this movement within 1 minute. Propofol increased to 20. POC glucose checked and is 220.  HR 101 RR 20 SpO2 100% BP 100/76    Witnessed seizure -hx seizures P -STAT EEG -MRI to r/p PRES given presenting HTN crisis -Keppra load, followed by 500mg  BID. Will need to be cognizant and adjust future doses of Keppra after to follow HD -PRN BZD for seizure, continue propofol for sedation -Engage neurology if MRI with abnormality, abnormal EEG -STAT BMP, mag, ionized cal. If worsening uremia, may need to call nephro for HD   Additional critical care time 15 minutes    Eliseo Gum MSN, AGACNP-BC [place] [phone] If no answer, [phone] 04/28/2020, 5:46 PM

## 2020-04-28 NOTE — Progress Notes (Signed)
Pharmacy Antibiotic Note  Jared [name]. is a 64 y.o. male admitted on 04/28/2020 with pulmonary edema.  Pharmacy has been consulted for Vancomycin/Cefeipme dosing for r/o sepsis, fever up to 101.8. WBC WNL. ESRD on HD TTS.   Plan: Vancomycin 1500 mg IV x 1, then 750 mg IV qHD TTS Cefepime 1g IV q24h Trend WBC, temp, renal function  F/U infectious work-up Drug levels as indicated   Height: 5\' 8"  (172.7 cm) Weight: 76.6 kg (168 lb 14 oz) IBW/kg (Calculated) : 68.4  Temp (24hrs), Avg:99.4 F (37.4 C), Min:97.6 F (36.4 C), Max:101.8 F (38.8 C)  Recent Labs  Lab 05/04/2020 2024 05/08/2020 2024 04/28/20 0130 04/28/20 0130 04/28/20 0500 04/28/20 0700 04/28/20 1027 04/28/20 1243 04/28/20 1835 04/28/20 2006  WBC 5.3  --  4.7  --  3.9*  --   --   --   --   --   CREATININE 12.60*   < > 13.02*   < >  --  5.74* 8.10* 8.51* 8.97* 9.39*   < > = values in this interval not displayed.    Estimated Creatinine Clearance: 7.5 mL/min (A) (by C-G formula based on SCr of 9.39 mg/dL (H)).    Allergies  Allergen Reactions  . Penicillins Other (See Comments)    Tolerates cephalosporins  UNSPECIFIED REACTION  Has patient had a PCN reaction causing immediate rash, facial/tongue/throat swelling, SOB or lightheadedness with hypotension: Unk Has patient had a PCN reaction causing severe rash involving mucus membranes or skin necrosis: Unk Has patient had a PCN reaction that required hospitalization: Unk Has patient had a PCN reaction occurring within the last 10 years: Unk If all of the above answers are "NO", then may proceed with Cephalosporin use.     Narda Bonds, PharmD, BCPS Clinical Pharmacist Phone: [phone]

## 2020-04-28 NOTE — Progress Notes (Signed)
In pt room to clean from bowel movement with NT. Pt lip smacking and horizontal nystagmus witnessed, as well as left arm jerking movements. Eliseo Gum NP notified and to bedside for assessment. Orders received, 2mg  ativan given and propofol increased, seizure stopped at this time. Will continue to monitor for seizure activity and proceed with orders.

## 2020-04-28 NOTE — Progress Notes (Signed)
Patient transported from MRI to 8S04 without complications.

## 2020-04-28 NOTE — Progress Notes (Signed)
Dr. Vaughan Browner notified of Troponin 261, pt will have follow up troponin later. Will continue to monitor

## 2020-04-28 NOTE — ED Provider Notes (Signed)
Gastrostomy tube replacement  Date/Time: 04/28/2020 3:28 PM Performed by: Deno Etienne, DO Authorized by: Deno Etienne, DO  Consent: The procedure was performed in an emergent situation. Verbal consent not obtained. Risks and benefits: risks, benefits and alternatives were discussed Patient identity confirmed: arm band Time out: Immediately prior to procedure a "time out" was called to verify the correct patient, procedure, equipment, support staff and site/side marked as required. Preparation: Patient was prepped and draped in the usual sterile fashion. Local anesthesia used: no  Anesthesia: Local anesthesia used: no  Sedation: Patient sedated: no  Patient tolerance: patient tolerated the procedure well with no immediate complications Comments: OG placed without difficulty    EJ placement: 18 gauge IV placed in L EJ. Skin prepped with alcohol pads, L EJ identified with Valsalva. Cannulated with good return of dark, non-pulsatile blood. Tachyderm placed after easily flushed with NS.    Deno Etienne, DO 04/28/20 1528

## 2020-04-28 NOTE — Progress Notes (Signed)
Patient transported to MRI 

## 2020-04-28 NOTE — Progress Notes (Addendum)
Initial Nutrition Assessment  DOCUMENTATION CODES:   Not applicable  INTERVENTION:   Initiate tube feeding via OG tube: Vital 1.5 at 50 ml/h (1200 ml per day) Pro-stat 30 ml TID  Provides 2100 kcal, 126 gm protein, 917 ml free water daily.  NUTRITION DIAGNOSIS:   Inadequate oral intake related to inability to eat as evidenced by NPO status.  GOAL:   Patient will meet greater than or equal to 90% of their needs  MONITOR:   Vent status, TF tolerance, Labs  REASON FOR ASSESSMENT:   Ventilator, Consult Enteral/tube feeding initiation and management  ASSESSMENT:   64 yo male admitted with hyperkalemia, volume overload, acute metabolic encephalopathy after missing two dialysis sessions. Required intubation and emergent HD on admission. PMH includes HTN, thyroid disease, DM, stroke, ESRD on HD, hypoglycemic seizure, prostate cancer, GERD.   Received MD Consult for TF initiation and management. OG tube in place.  Patient is currently intubated on ventilator support MV: 13.7 L/min Temp (24hrs), Avg:98.5 F (36.9 C), Min:97.1 F (36.2 C), Max:101.6 F (38.7 C)  Propofol: 4.2 ml/hr providing 111 kcals from lipid.  Labs reviewed. Sodium 134 (L), K 6.1 (H), BUN 61 (H), creat 8.51 (H) Glucose: 338-250-539  Medications reviewed and include colace, novolog, levemir, miralax, lokelma, propofol.   Most recent weight PTA is 69.3 kg 08/27/19. Current weight is elevated due to volume overload.   Only able to remove 0.5 L with HD overnight due to decreased blood pressure. K down to 4 this AM s/p HD, back up to 6.1 at most recent lab check today.  Plans to repeat HD tomorrow.  NUTRITION - FOCUSED PHYSICAL EXAM:    Most Recent Value  Orbital Region  No depletion  Upper Arm Region  No depletion  Thoracic and Lumbar Region  No depletion  Buccal Region  No depletion  Temple Region  No depletion  Clavicle Bone Region  No depletion  Clavicle and Acromion Bone Region  No depletion   Scapular Bone Region  No depletion  Dorsal Hand  No depletion  Patellar Region  No depletion  Anterior Thigh Region  No depletion  Posterior Calf Region  No depletion  Edema (RD Assessment)  Severe  Hair  Reviewed  Eyes  Unable to assess  Mouth  Unable to assess  Skin  Reviewed  Nails  Reviewed       Diet Order:   Diet Order            Diet NPO time specified  Diet effective now              EDUCATION NEEDS:   Not appropriate for education at this time  Skin:  Skin Assessment: Reviewed RN Assessment  Last BM:  no BM documented  Height:   Ht Readings from Last 1 Encounters:  05/01/2020 5\' 8"  (1.727 m)    Weight:   Wt Readings from Last 1 Encounters:  04/28/20 76.6 kg    Ideal Body Weight:  70 kg  BMI:  Body mass index is 25.68 kg/m.  Estimated Nutritional Needs:   Kcal:  2076  Protein:  110-140 gm  Fluid:  UOP + 1 L    Lucas Mallow, RD, LDN, CNSC Please refer to Amion for contact information.

## 2020-04-29 ENCOUNTER — Inpatient Hospital Stay (HOSPITAL_COMMUNITY): Payer: Medicare Other

## 2020-04-29 DIAGNOSIS — R569 Unspecified convulsions: Secondary | ICD-10-CM

## 2020-04-29 DIAGNOSIS — I952 Hypotension due to drugs: Secondary | ICD-10-CM

## 2020-04-29 LAB — CBC
HCT: 34.1 % — ABNORMAL LOW (ref 39.0–52.0)
Hemoglobin: 10.6 g/dL — ABNORMAL LOW (ref 13.0–17.0)
MCH: 27.5 pg (ref 26.0–34.0)
MCHC: 31.1 g/dL (ref 30.0–36.0)
MCV: 88.6 fL (ref 80.0–100.0)
Platelets: 103 10*3/uL — ABNORMAL LOW (ref 150–400)
RBC: 3.85 MIL/uL — ABNORMAL LOW (ref 4.22–5.81)
RDW: 17.3 % — ABNORMAL HIGH (ref 11.5–15.5)
WBC: 7.4 10*3/uL (ref 4.0–10.5)
nRBC: 0 % (ref 0.0–0.2)

## 2020-04-29 LAB — HEPATIC FUNCTION PANEL
ALT: 31 U/L (ref 0–44)
AST: 49 U/L — ABNORMAL HIGH (ref 15–41)
Albumin: 3.3 g/dL — ABNORMAL LOW (ref 3.5–5.0)
Alkaline Phosphatase: 69 U/L (ref 38–126)
Bilirubin, Direct: 0.1 mg/dL (ref 0.0–0.2)
Indirect Bilirubin: 1.9 mg/dL — ABNORMAL HIGH (ref 0.3–0.9)
Total Bilirubin: 2 mg/dL — ABNORMAL HIGH (ref 0.3–1.2)
Total Protein: 7.9 g/dL (ref 6.5–8.1)

## 2020-04-29 LAB — RENAL FUNCTION PANEL
Albumin: 2.9 g/dL — ABNORMAL LOW (ref 3.5–5.0)
Albumin: 2.7 g/dL — ABNORMAL LOW (ref 3.5–5.0)
Anion gap: 20 — ABNORMAL HIGH (ref 5–15)
Anion gap: 18 — ABNORMAL HIGH (ref 5–15)
BUN: 86 mg/dL — ABNORMAL HIGH (ref 8–23)
BUN: 35 mg/dL — ABNORMAL HIGH (ref 8–23)
CO2: 22 mmol/L (ref 22–32)
CO2: 26 mmol/L (ref 22–32)
Calcium: 8.7 mg/dL — ABNORMAL LOW (ref 8.9–10.3)
Calcium: 9 mg/dL (ref 8.9–10.3)
Chloride: 94 mmol/L — ABNORMAL LOW (ref 98–111)
Chloride: 95 mmol/L — ABNORMAL LOW (ref 98–111)
Creatinine, Ser: 9.45 mg/dL — ABNORMAL HIGH (ref 0.61–1.24)
Creatinine, Ser: 5.24 mg/dL — ABNORMAL HIGH (ref 0.61–1.24)
GFR calc Af Amer: 6 mL/min — ABNORMAL LOW (ref >60)
GFR calc Af Amer: 12 mL/min — ABNORMAL LOW (ref >60)
GFR calc non Af Amer: 5 mL/min — ABNORMAL LOW (ref >60)
GFR calc non Af Amer: 11 mL/min — ABNORMAL LOW (ref >60)
Glucose, Bld: 226 mg/dL — ABNORMAL HIGH (ref 70–99)
Glucose, Bld: 80 mg/dL (ref 70–99)
Phosphorus: 6.8 mg/dL — ABNORMAL HIGH (ref 2.5–4.6)
Phosphorus: 4.6 mg/dL (ref 2.5–4.6)
Potassium: 4.3 mmol/L (ref 3.5–5.1)
Potassium: 3.4 mmol/L — ABNORMAL LOW (ref 3.5–5.1)
Sodium: 136 mmol/L (ref 135–145)
Sodium: 139 mmol/L (ref 135–145)

## 2020-04-29 LAB — GLUCOSE, CAPILLARY
Glucose-Capillary: 238 mg/dL — ABNORMAL HIGH (ref 70–99)
Glucose-Capillary: 199 mg/dL — ABNORMAL HIGH (ref 70–99)
Glucose-Capillary: 172 mg/dL — ABNORMAL HIGH (ref 70–99)
Glucose-Capillary: 160 mg/dL — ABNORMAL HIGH (ref 70–99)
Glucose-Capillary: 57 mg/dL — ABNORMAL LOW (ref 70–99)
Glucose-Capillary: 83 mg/dL (ref 70–99)
Glucose-Capillary: 78 mg/dL (ref 70–99)

## 2020-04-29 LAB — TRIGLYCERIDES: Triglycerides: 73 mg/dL (ref <150)

## 2020-04-29 LAB — C DIFFICILE QUICK SCREEN W PCR REFLEX
C Diff antigen: NEGATIVE
C Diff interpretation: NOT DETECTED
C Diff toxin: NEGATIVE

## 2020-04-29 LAB — TSH: TSH: 1.514 u[IU]/mL (ref 0.350–4.500)

## 2020-04-29 LAB — AMMONIA: Ammonia: 41 umol/L — ABNORMAL HIGH (ref 9–35)

## 2020-04-29 MED ORDER — INSULIN DETEMIR 100 UNIT/ML ~~LOC~~ SOLN
12.0000 [IU] | Freq: Two times a day (BID) | SUBCUTANEOUS | Status: DC
  Administered 2020-04-29 – 2020-04-30 (×3): 12 [IU] via SUBCUTANEOUS
  Filled 2020-04-29 (×6): qty 0.12

## 2020-04-29 MED ORDER — VALPROIC ACID 250 MG/5ML PO SOLN
400.0000 mg | Freq: Three times a day (TID) | ORAL | Status: DC
  Administered 2020-04-29 – 2020-05-12 (×38): 400 mg
  Filled 2020-04-29 (×39): qty 10

## 2020-04-29 MED ORDER — VALPROATE SODIUM 500 MG/5ML IV SOLN
1500.0000 mg | Freq: Once | INTRAVENOUS | Status: AC
  Administered 2020-04-29: 1500 mg via INTRAVENOUS
  Filled 2020-04-29 (×2): qty 15

## 2020-04-29 MED ORDER — SODIUM CHLORIDE 0.9 % IV SOLN
250.0000 mL | INTRAVENOUS | Status: DC
  Administered 2020-04-29 – 2020-05-07 (×4): 250 mL via INTRAVENOUS

## 2020-04-29 MED ORDER — INSULIN ASPART 100 UNIT/ML ~~LOC~~ SOLN
2.0000 [IU] | SUBCUTANEOUS | Status: DC
  Administered 2020-04-29 – 2020-04-30 (×4): 2 [IU] via SUBCUTANEOUS

## 2020-04-29 MED ORDER — DOCUSATE SODIUM 50 MG/5ML PO LIQD
100.0000 mg | Freq: Two times a day (BID) | ORAL | Status: DC
  Administered 2020-04-30 – 2020-05-12 (×19): 100 mg
  Filled 2020-04-29 (×21): qty 10

## 2020-04-29 MED ORDER — DEXTROSE 50 % IV SOLN
INTRAVENOUS | Status: AC
  Administered 2020-04-29: 25 mL via INTRAVENOUS
  Filled 2020-04-29: qty 50

## 2020-04-29 MED ORDER — ALBUMIN HUMAN 5 % IV SOLN
25.0000 g | Freq: Once | INTRAVENOUS | Status: AC
  Administered 2020-04-29: 25 g via INTRAVENOUS
  Filled 2020-04-29: qty 500

## 2020-04-29 MED ORDER — DEXTROSE 50 % IV SOLN: 12.5000 g | INTRAVENOUS | Status: AC

## 2020-04-29 MED ORDER — LACTATED RINGERS IV BOLUS
1000.0000 mL | Freq: Once | INTRAVENOUS | Status: AC
  Administered 2020-04-29: 1000 mL via INTRAVENOUS

## 2020-04-29 MED ORDER — POLYETHYLENE GLYCOL 3350 17 G PO PACK: 17.0000 g | PACK | Freq: Every day | ORAL | Status: DC | PRN

## 2020-04-29 MED ORDER — PHENYLEPHRINE HCL-NACL 10-0.9 MG/250ML-% IV SOLN
25.0000 ug/min | INTRAVENOUS | Status: DC
  Administered 2020-04-29: 50 ug/min via INTRAVENOUS
  Administered 2020-04-29: 35 ug/min via INTRAVENOUS
  Administered 2020-04-30 (×4): 25 ug/min via INTRAVENOUS
  Administered 2020-05-01: 10 ug/min via INTRAVENOUS
  Filled 2020-04-29 (×7): qty 250

## 2020-04-29 MED ORDER — LEVETIRACETAM IN NACL 500 MG/100ML IV SOLN
500.0000 mg | INTRAVENOUS | Status: DC
  Administered 2020-04-29 – 2020-05-06 (×4): 500 mg via INTRAVENOUS
  Filled 2020-04-29 (×4): qty 100

## 2020-04-29 MED ORDER — MIDAZOLAM 50MG/50ML (1MG/ML) PREMIX INFUSION
0.1000 mg/h | INTRAVENOUS | Status: DC
  Administered 2020-04-29: 10 mg/h via INTRAVENOUS
  Administered 2020-04-29 (×2): 20 mg/h via INTRAVENOUS
  Administered 2020-04-29: 10 mg/h via INTRAVENOUS
  Administered 2020-04-30: 30 mg/h via INTRAVENOUS
  Filled 2020-04-29 (×5): qty 50

## 2020-04-29 NOTE — Care Plan (Signed)
LTM EEG reviewed till 1400.  He continues to have seizures.  Notified nurse to increase propofol to 80mcg/hr as well as start Versed drip at 10 mL/hr.  Marlyss Cissell Barbra Sarks

## 2020-04-29 NOTE — Progress Notes (Signed)
STAT LTM set up; [facility] monitoring, no initial skin breakdown was seen. Event button tested. Nurse educated on event button. Dr Hortense Ramal notified.

## 2020-04-29 NOTE — Progress Notes (Cosign Needed Addendum)
NOT FOR USE IN MEDICAL RECORD. EDUCATIONAL USE ONLY.  NAME:  Jared [name], Jared [name] MRN:  [id number], DOB:  [birth year], LOS: 2 ADMISSION DATE:  04/28/2020, CONSULTATION DATE:  04/29/2020 REFERRING MD:  Dr, Tyrone Nine, CHIEF COMPLAINT:  Unresponsiveness   Brief History   64 year old male with PMH significant for ESRD on HD presented to ED for unresponsiveness from home. He has missed 2 dialysis sessions. He was intubated upon arrival. Initial work-up notable for hyperkalemia and volume overload. He was transferred to the ICU for emergent HD.  History of present illness   Othal Kubitz is a 64 year old male with a PMH significant for ESRD on HD Tuesday, Thursday, and Saturday, DM, HTN, seizures, and stroke. He last received HD on 5/29 and afterward celebrated his birthday with his family. His family reported concerns that he did not adhere to his renal diet or fluid restrictions and had since been complaining of nausea, vomiting, and diarrhea. Due to these symptoms, he missed his HD session on 6/1 and was found unresponsive later than evening. Upon arrival, EMS treated empirically for hyperkalemia with IV calcium, HCO3, and lidocaine and he was transported to the ED. He was unresponsive upon arrival to the ED and was intubated for airway protection. Admission workup notable for hyperkalemia, hyperglycemia, and hypertension with SBP's in the 220's. Nephrology was consulted and he was admitted to the ICU for emergent HD.   Past Medical History  ESRD - HD Tuesday, Thursday, Saturday GERD CVA Prostate cancer DM II HTN Seizures Hyperparathyroidism  Significant Hospital Events   6/1 Admitted to ICU for emergent HD 6/2 Emergent HD 6/3 Febrile overnight; started on broad spectrum antibiotics. HD today. Seizures reported on EEG  Consults:  6/1 Nephrology  Procedures:  6/1 ETT >>   Significant Diagnostic Tests:  6/2 CT Head >> Chronic microvascular changes. No intracranial abnormalities 6/2 MRI Head  >> Age-related cerebral atrophy otherwise no acute abnormalities. No findings to suggest PRES. 6/3 CXR >> Lower lung volumes but improved pulmonary opacities. Endotracheal tube remains below clavicular heads. Gastric tube terminates below diaphragm.  Micro Data:  6/1 MRSA PCR >> Negative 6/1 SARS CoV2 >> Negative 6/3 Blood cultures >> NGTD 6/3 Respiratory culture >> NGTD  Antimicrobials:  6/3 Vancomycin >> 6/3 Cefepime >>  Interim history/subjective:  Concern for seizure-like activity yesterday afternoon with reports of lip smacking and horizontal nystagmus which was self-limiting. He had another episode concerning for seizures with LUE jerking which terminated with the administration of 2mg  ativan.  He has a history of seizures and takes Levetiracetam at home. Underwent CT and MRI of the head which showed no intracranial abnormalities. EEG was also completed which showed 7 seizures per epileptologist.   Overnight, he became febrile, was cultured, and started on broad spectrum antibiotics.   Objective   Blood pressure 117/75, pulse 82, temperature 99 F (37.2 C), temperature source Axillary, resp. rate (!) 29, height 5\' 8"  (1.727 m), weight 75.7 kg, SpO2 100 %.    Vent Mode: PRVC FiO2 (%):  [40 %] 40 % Set Rate:  [18 bmp] 18 bmp Vt Set:  [550 mL] 550 mL PEEP:  [5 cmH20-8 cmH20] 5 cmH20 Plateau Pressure:  [20 cmH20-24 cmH20] 20 cmH20   Intake/Output Summary (Last 24 hours) at 04/29/2020 0821 Last data filed at 04/29/2020 0600 Gross per 24 hour  Intake 1372.46 ml  Output --  Net 1372.46 ml   Filed Weights   05/08/2020 2316 04/28/20 0125 04/29/20 0500  Weight: 76.6 kg 76.6  kg 75.7 kg    Examination: General: Elderly black male laying in bed in no acute distress and on MV HENT: No corneal reflex. Mild bilateral scleral edema. No scleral icterus. 7.5 ETT and OGT in place.  Lungs: Equal chest rise and fall on MV without accessory muscle use. Bilateral breath sounds  CTAB.  Cardiovascular: S1/S2 present. No JVD. 2+ radial and DP pulses. No peripheral edema.  Abdomen: Soft. Non-distended. Not obese. Bowel sounds present. Extremities: Normal muscle bulk and tone with no evidence of joint deformity. Neuro: Sedated on propofol drip. Does not follow commands or open eyes. Minimal upper extremity movement with deep painful stimulation. PERRL. No corneal reflex. Cough intact.   Resolved Hospital Problem list   Nausea and vomiting  Assessment & Plan:  Acute metabolic encephalopathy EEG evidence of seizures EEG performed on 6/2 showed multiple seizures. History of seizures and on keppra at home. CT Head and MRI without intracranial abnormalities. Hypoxia and acidosis have been corrected.   Plan: -Continuous EEG  -Neurology consult -Keppra dose increased yesterday -Maintain on propofol for sedation -Wean sedation for RASS goal -1.  -IV Fentanyl PRN for pain -Avoid additional sedating medications  Acute hypoxic respiratory failure  Improving gas exchange. Likely secondary to pulmonary edema from volume overload due to missing HD. Febrile overnight with some opacifications on CXR which can be concerning for pneumonia.   Plan: -Lung-protective ventilation strategy -TV ~33mL/kg, PEEP 8. Keep SpO2 > 92% -Will hold off on SAT until seizures can be further evaluated with cEEG -Respiratory culture drawn for possible infectious component in setting of fevers.  Hypertensive Emergency Improving after HD. Likely secondary to volume overload due to missed HD in the setting of ESRD. Takes amlodipine, clonidine, metoprolol, and Imdur at home.  Plan: -Goal SBP < 170. Hydralazine PRN -Resumed home anti-hypertensives  ESRD on HD - TTS Hyperkalemia secondary to ESRD Potassium has remained persistently high despite receiving HD and kayexalate yesterday.  Plan: -HD again this morning. HD schedule per Nephrology recs.  -Avoid nephrotoxins -Heparin for DVT/PE  prophylaxis -Follow BMP this afternoon -Continuous cardiac monitoring -Continue to trend potassium and renal indices  Thrombocytopenia Likely secondary to HD  Plan: -Continue to monitor for evidence of bleeding -Trend platelet count daily  Possible Sepsis Febrile overnight without leukocytosis. No clear indications for infection. He is having very liquid stool from [place].  Plan: -Blood and respiratory cultures -C-diff stool sample sent -Broad spectrum antibiotics 5-7 days. -Await culture data. Trend fever curve   DM, poorly controlled A1c 9.6 on insulin therapy at home. Persistently high CBG despite starting SSI + basal yesterday.  Plan: -Increased SSI and long-acting insulin today -BG q4h -Diabetes coordinator    Best practice:  Diet: TF per nutrition Pain/Anxiety/Delirium protocol (if indicated): Propofol and fentanyl PRN VAP protocol (if indicated): Yes DVT prophylaxis: Heparin SQ GI prophylaxis: Protonix  Glucose control: SSI and basal Mobility: Bedrest Code Status: Full Family Communication: Pending Disposition: ICU  Labs   CBC: Recent Labs  Lab 05/26/2020 2024 05/10/2020 2024 05/22/2020 2124 04/28/20 0130 04/28/20 0500 04/28/20 0507 04/29/20 0153  WBC 5.3  --   --  4.7 3.9*  --  7.4  NEUTROABS 4.3  --   --   --   --   --   --   HGB 14.3   < > 14.6 13.5 13.3 13.9 10.6*  HCT 45.1   < > 43.0 41.2 40.8 41.0 34.1*  MCV 86.2  --   --  84.3 84.6  --  88.6  PLT 142*  --   --  166 176  --  103*   < > = values in this interval not displayed.    Basic Metabolic Panel: Recent Labs  Lab 04/28/20 0700 04/28/20 1027 04/28/20 1243 04/28/20 1835 04/28/20 2006  NA 136 132* 134* 135 136  K 4.0 6.8* 6.1* 5.4* 5.9*  CL 96* 92* 93* 94* 93*  CO2 24 17* 23 24 23   GLUCOSE 136* 291* 330* 280* 289*  BUN 40* 58* 61* 68* 79*  CREATININE 5.74* 8.10* 8.51* 8.97* 9.39*  CALCIUM 9.7 9.0 9.0 8.9 8.8*  MG  --   --   --  2.0  --   PHOS  --   --   --   --  5.1*    GFR: Estimated Creatinine Clearance: 7.5 mL/min (A) (by C-G formula based on SCr of 9.39 mg/dL (H)). Recent Labs  Lab 05/02/2020 2024 04/28/20 0130 04/28/20 0500 04/29/20 0153  WBC 5.3 4.7 3.9* 7.4    Liver Function Tests: Recent Labs  Lab 05/03/2020 2024 04/28/20 2006  AST 30  --   ALT 20  --   ALKPHOS 94  --   BILITOT 1.3*  --   PROT 7.3  --   ALBUMIN 3.9 3.0*   No results for input(s): LIPASE, AMYLASE in the last 168 hours. No results for input(s): AMMONIA in the last 168 hours.  ABG    Component Value Date/Time   PHART 7.489 (H) 04/28/2020 0507   PCO2ART 39.0 04/28/2020 0507   PO2ART 223 (H) 04/28/2020 0507   HCO3 29.8 (H) 04/28/2020 0507   TCO2 31 04/28/2020 0507   ACIDBASEDEF 5.0 (H) 05/23/2020 2124   O2SAT 100.0 04/28/2020 0507     Coagulation Profile: No results for input(s): INR, PROTIME in the last 168 hours.  Cardiac Enzymes: No results for input(s): CKTOTAL, CKMB, CKMBINDEX, TROPONINI in the last 168 hours.  HbA1C: Hemoglobin A1C  Date/Time Value Ref Range Status  10/30/2018 04:39 PM 7.2 (A) 4.0 - 5.6 % Final   Hgb A1c MFr Bld  Date/Time Value Ref Range Status  04/28/2020 01:30 AM 9.6 (H) 4.8 - 5.6 % Final    Comment:    (NOTE) Pre diabetes:          5.7%-6.4% Diabetes:              >6.4% Glycemic control for   <7.0% adults with diabetes   02/13/2018 02:08 PM 6.3 (H) 4.8 - 5.6 % Final    Comment:    (NOTE) Pre diabetes:          5.7%-6.4% Diabetes:              >6.4% Glycemic control for   <7.0% adults with diabetes     CBG: Recent Labs  Lab 04/28/20 1740 04/28/20 1910 04/28/20 2322 04/29/20 0325 04/29/20 0720  GLUCAP 265* 286* 240* 238* 199*    Review of Systems:   Unable to obtain secondary to intubation and sedation  Past Medical History  He,  has a past medical history of Anemia, Cancer ([place]), Chronic kidney disease, Diabetes mellitus without complication ([place]), GERD (gastroesophageal reflux disease), Hypertension,  Seizures ([place]), Stroke ([place]), and Thyroid disease.   Surgical History    Past Surgical History:  Procedure Laterality Date  . AV FISTULA PLACEMENT Left 06/04/2014   Procedure: ARTERIOVENOUS (AV) FISTULA CREATION-  BRACHIOCEPHALIC WITH LIGATION OF COMPETEING BRANCH;  Surgeon: Mal Misty, MD;  Location: [facility];  Service: Vascular;  Laterality: Left;  . INGUINAL HERNIA REPAIR Left 02/19/2018   Procedure: LAPAROSCOPIC LEFT  INGUINAL HERNIA;  Surgeon: Ralene Ok, MD;  Location: [facility];  Service: General;  Laterality: Left;  . INSERTION OF DIALYSIS CATHETER  04/2014  . INSERTION OF MESH Left 02/19/2018   Procedure: INSERTION OF MESH;  Surgeon: Ralene Ok, MD;  Location: [facility];  Service: General;  Laterality: Left;  . IR GENERIC HISTORICAL Left 11/30/2016   IR THROMBECTOMY AV FISTULA W/THROMBOLYSIS/PTA/STENT INC/SHUNT/IMG LT 11/30/2016 Markus Daft, MD [facility]  . IR GENERIC HISTORICAL  11/30/2016   IR US GUIDE VASC ACCESS LEFT 11/30/2016 Markus Daft, MD [facility]  . IR PARACENTESIS  08/21/2018     Social History   reports that he quit smoking about 35 years ago. He has never used smokeless tobacco. He reports current alcohol use of about 1.0 standard drinks of alcohol per week. He reports that he does not use drugs.   Family History   His family history includes Cancer - Lung in his mother; Cancer - Other in his father. There is no history of Diabetes.   Allergies Allergies  Allergen Reactions  . Penicillins Other (See Comments)    Tolerates cephalosporins  UNSPECIFIED REACTION  Has patient had a PCN reaction causing immediate rash, facial/tongue/throat swelling, SOB or lightheadedness with hypotension: Unk Has patient had a PCN reaction causing severe rash involving mucus membranes or skin necrosis: Unk Has patient had a PCN reaction that required hospitalization: Unk Has patient had a PCN reaction occurring within the last 10 years: Unk If all of the above answers are "NO", then  may proceed with Cephalosporin use.      Home Medications  Prior to Admission medications   Medication Sig Start Date End Date Taking? Authorizing Provider  amLODipine (NORVASC) 10 MG tablet Take 1 tablet (10 mg total) by mouth daily. 12/02/17   Sheikh, Georgina Quint Latif, DO  atorvastatin (LIPITOR) 20 MG tablet TAKE 1 TABLET BY MOUTH ONCE DAILY 09/06/18   Henson, Vickie L, NP-C  b complex-vitamin c-folic acid (NEPHRO-VITE) 0.8 MG TABS tablet TAKE 1 TABLET BY MOUTH EVERY DAY (ON DIALYSIS DAYS, TAKE AFTER DIALYSIS TREATMENT) 09/25/18   [provider]  cinacalcet (SENSIPAR) 30 MG tablet Take 30 mg by mouth daily. 04/25/18   [provider]  cloNIDine (CATAPRES) 0.3 MG tablet Take 0.3 mg by mouth 2 (two) times daily. as directed 04/24/18   [provider]  hydrALAZINE (APRESOLINE) 100 MG tablet Take 1 tablet (100 mg total) by mouth 3 (three) times daily. 03/27/17   Angiulli, Lavon Paganini, PA-C  insulin NPH Human (NOVOLIN N) 100 UNIT/ML injection Inject 20 units every morning Patient taking differently: Inject 30 Units into the skin daily before breakfast.  04/03/17   Renato Shin, MD  isosorbide mononitrate (IMDUR) 30 MG 24 hr tablet Take 1 tablet by mouth once daily 04/04/19   Raenette Rover, Vickie L, NP-C  levETIRAcetam (KEPPRA) 250 MG tablet Take 1 tablet by mouth twice daily 09/03/19   Frann Rider, NP  metoprolol tartrate (LOPRESSOR) 100 MG tablet Take 1 tablet (100 mg total) by mouth 2 (two) times daily. 05/21/17   Henson, Vickie L, NP-C  ondansetron (ZOFRAN ODT) 4 MG disintegrating tablet Take 1 tablet (4 mg total) by mouth every 8 (eight) hours as needed for nausea or vomiting. 10/23/18   Virgel Manifold, MD  pantoprazole (PROTONIX) 40 MG tablet TAKE 1 TABLET BY MOUTH ONCE DAILY AT 6AM Patient taking differently: TAKE 1 TABLET BY MOUTH  twice DAILy 02/01/18   Henson, Vickie L, NP-C  promethazine-dextromethorphan (PROMETHAZINE-DM) 6.25-15 MG/5ML syrup TAKE 5 MLS BY MOUTH 4 TIMES DAILY AS NEEDED  10/21/18   [provider]  sevelamer carbonate (RENVELA) 800 MG tablet Take 3 tablets (2,400 mg total) by mouth 3 (three) times daily with meals. 03/27/17   Angiulli, Lavon Paganini, PA-C     Critical care time: 65 minutes    Electronically signed by: Mamie Nick. Emogene Morgan Student, 04/29/20 [phone]

## 2020-04-29 NOTE — Progress Notes (Signed)
  RT Note:  Sputum Culture sent to lab.

## 2020-04-29 NOTE — Progress Notes (Signed)
[facility] Progress Note  Subjective: pt seen in iCU, on vent and sedated  Vitals:   04/29/20 1103 04/29/20 1115 04/29/20 1130 04/29/20 1145  BP:  97/68 130/86 133/76  Pulse:  84 83 86  Resp:      Temp:      TempSrc:      SpO2: 99%     Weight:      Height:        Exam:   On vent and sedated  no jvd  Chest cta bilat  Cor reg no RG  Abd soft ntnd no ascites   Ext no LE edema   Alert, NF, ox3   LUA aVF+bruit    OP HD: TTS NW  4h  400/800  2/2 bath 72.5kg  Hep 2300   - hect 7 ug   CXR 6/3 today - improved pulm infiltrates/ opacities  Assessment/ Plan: 1. Fever/ pulm infiltrates - CXR showing improved infiltrates today, poss PNA improving, on IV vanc/ cefepime 2. AMS: admit B/Cr high 121/ 13.02, which could implicate uremia/ met enceph as contributor 3. Acute hypoxic resp failure: on vent per CCM 4. ESRD - on HD TTS. Had HD early Tuesday am and due for HD today. Get solute down further 5. HTN/ volume: no vol excess on exam, BP's labile, up 4kg by wts] 6. Secondary hyperPTH: cont outpt hectorol dose   Rob Kingsley Farace 04/29/2020, 11:46 AM   Recent Labs  Lab 04/28/20 0507 04/28/20 0700 04/28/20 2006 04/29/20 0153 04/29/20 0825  K 4.3   < > 5.9*  --  4.3  BUN  --    < > 79*  --  86*  CREATININE  --    < > 9.39*  --  9.45*  CALCIUM  --    < > 8.8*  --  8.7*  PHOS  --   --  5.1*  --  6.8*  HGB 13.9  --   --  10.6*  --    < > = values in this interval not displayed.   Inpatient medications: . amLODipine  10 mg Per Tube Daily  . calcium gluconate  1 g Intravenous Once  . chlorhexidine gluconate (MEDLINE KIT)  15 mL Mouth Rinse BID  . Chlorhexidine Gluconate Cloth  6 each Topical Q0600  . cloNIDine  0.3 mg Per Tube BID  . docusate  100 mg Oral BID  . feeding supplement (PRO-STAT SUGAR FREE 64)  30 mL Per Tube TID  . heparin  5,000 Units Subcutaneous Q8H  . hydrALAZINE  100 mg Per Tube Q8H  . insulin aspart  0-15 Units Subcutaneous Q4H  . insulin  aspart  2 Units Subcutaneous Q4H  . insulin detemir  12 Units Subcutaneous BID  . mouth rinse  15 mL Mouth Rinse 10 times per day  . metoprolol tartrate  100 mg Per Tube BID  . pantoprazole (PROTONIX) IV  40 mg Intravenous QHS  . sodium bicarbonate  50 mEq Intravenous Once   . ceFEPime (MAXIPIME) IV    . feeding supplement (VITAL 1.5 CAL) 1,000 mL (04/28/20 1600)  . levETIRAcetam Stopped (04/28/20 2316)  . propofol (DIPRIVAN) infusion 30 mcg/kg/min (04/29/20 1000)  . vancomycin     acetaminophen (TYLENOL) oral liquid 160 mg/5 mL, dextrose, docusate sodium, fentaNYL (SUBLIMAZE) injection, fentaNYL (SUBLIMAZE) injection, hydrALAZINE, LORazepam, polyethylene glycol

## 2020-04-29 NOTE — Procedures (Signed)
Central Venous Catheter Insertion Procedure Note Jared [name] [id number] [birth year]  Procedure: Insertion of Central Venous Catheter Indications: Drug and/or fluid administration  Procedure Details Consent: Unable to obtain consent because of emergent need. Prior attempts made to reach decision maker in family. Procedure discussed with PCCM physician, decision made to proceed due to level of necessity. . Time Out: Verified patient identification, verified procedure, site/side was marked, verified correct patient position, special equipment/implants available, medications/allergies/relevent history reviewed, required imaging and test results available.  Performed  Maximum sterile technique was used including antiseptics, cap, gloves, gown, hand hygiene, mask and sheet. Skin prep: Chlorhexidine; local anesthetic administered A antimicrobial bonded/coated triple lumen catheter was placed in the right femoral vein due to emergent situation, poor candidacy of IJ vessels, placed using the Seldinger technique.  Evaluation Blood flow good Complications: No apparent complications Patient did tolerate procedure well. Chest X-ray ordered to verify placement.  CXR: not indicated for femoral CVC placement.   Eliseo Gum MSN, AGACNP-BC [place] [phone] If no answer, [phone] 04/29/2020, 5:39 PM

## 2020-04-29 NOTE — Progress Notes (Signed)
NAME:  Jared [name], Jared [name] MRN:  [id number], DOB:  June 12, 1956, LOS: 2 ADMISSION DATE:  05/05/2020, CONSULTATION DATE: 6/1 REFERRING MD: Dr. Tyrone Nine EDP, CHIEF COMPLAINT: Unresponsiveness  Brief History   64 year old male with ESRD on HD unresponsive from home.  Intubated in ED.  Has missed 2 dialysis sessions.  Hyperkalemic and volume overloaded in the ED.  Admitted to ICU for emergent hemodialysis.  History of present illness   64 year old male with past medical history as below, which is significant for ESRD on HD TTS, diabetes, hypertension, hypoglycemic seizure, and stroke.  His last dialysis treatment was on Saturday 5/29, but was abbreviated.  After his dialysis session he went to [facility] with his sisters to celebrate his birthday.  His family worries he may have overdone it and not adhere to his renal diet or fluid restrictions.  Since returning home to [facility] he has complained of nausea, vomiting, and diarrhea.  The symptoms prevented him from going to dialysis on 6/1 and it was rescheduled for 6/2. In the late evening hours of 6/1 he was found at home to be unresponsive.  EMS was called.  Upon their arrival and learning his medical history he was treated empirically for hyperkalemia and was transported to the emergency department.  He remained unresponsive and was intubated for airway protection.  Laboratory evaluation significant for potassium greater than 7.5, bicarb 18, glucose 565, creatinine 12.6, and BUN 125.  He was also profoundly hypertensive with systolic blood pressures up to the 220s.  Nephrology evaluated in the emergency department and is planning for emergent dialysis.  PCCM asked to admit to ICU.  Past Medical History   has a past medical history of Anemia, Cancer ([place]), Chronic kidney disease, Diabetes mellitus without complication ([place]), GERD (gastroesophageal reflux disease), Hypertension, Seizures ([place]), Stroke ([place]), and Thyroid disease.  Significant Hospital Events     6/1 admit for emergent dialysis  Consults:  Nephrology  Procedures:  ETT 6/1>  Significant Diagnostic Tests:  CT head 6/1 > chronic microvascular ischemic changes, volume loss. No acute intracranial abnormalities CXR 6/2> improved pulmonary edema  MRI Brain 6/2> no acute intracranial abnormality, no findings suggestive of PRES.  CXR 6/3> lower lung volumes. Interval improvement in ASD. Bowel loops visible.  Micro Data:  6/1 SARS Cov2 > neg  6/1 MRSA> neg 6/3 tracheal aspirate>> 6/3 BCx>> 6/3 CDiff>>  Antimicrobials:   6/3 vanc> 6/3 cefepime>  Interim history/subjective:  Seizure late in day shift 6/2, received 2mg  ativan with cessation  Febrile overnight, received APAP, BCx and Tracheal aspirate sent. Pt  Started on vanc and cefepime.  Receiving HD this morning (6/3) Loose stools, present on admission, have increased.  Objective   Blood pressure 99/67, pulse 80, temperature 99 F (37.2 C), temperature source Axillary, resp. rate (!) 29, height 5\' 8"  (1.727 m), weight 75.7 kg, SpO2 100 %.    Vent Mode: PRVC FiO2 (%):  [40 %] 40 % Set Rate:  [18 bmp] 18 bmp Vt Set:  [550 mL-580 mL] 550 mL PEEP:  [5 cmH20-8 cmH20] 5 cmH20 Plateau Pressure:  [20 cmH20-24 cmH20] 20 cmH20   Intake/Output Summary (Last 24 hours) at 04/29/2020 0741 Last data filed at 04/29/2020 0600 Gross per 24 hour  Intake 1389.26 ml  Output --  Net 1389.26 ml   Filed Weights   04/30/2020 2316 04/28/20 0125 04/29/20 0500  Weight: 76.6 kg 76.6 kg 75.7 kg    Examination: General: Chronically ill appearing older adult M, intubated and sedated, receiving  iHD, NAD  HENT: NCAT ETT OGT secure. Pink mm. Trachea midline  Lungs: Symmetrical chest expansion. Scattered crackles. No accessory muscle use  Cardiovascular: RRR s1s2 no rgm. Cap refill < 3 seconds  Abdomen: soft, round, hyperactive  Extremities: LUE fistula with thrill  Neuro: Sedated, PERRL 17mm. Does not follow commands   Resolved Hospital Problem  list     Assessment & Plan:   Acute metabolic encephalopathy  In setting of uremia, initial hypoxia, acidosis. Also possible that initial encephalopathy related to seizure hx, did have witnessed seizure 6/2.  P -RASS goal 0 to -1  - Propofol and PRN fent  -Correction of metabolic abnormalities as able  Seizure -hx seizure + witnessed seizure 6/2 requiring 1x 2mg  ativan -received keppra bolus after, daily Keppra increased from 250mg  qD to 1g qD  -MRI without evidence of PRES  P - discussed with pharmacy -- 1g Keppra per day, after HD  - PRN ativan ordered - f/u EEG read   Acute hypoxic respiratory failure requiring intubation -non-cardiogenic pulmonary edema improving  P -Cont MV support, with WUA/SBT qD -VAP bundle -PAD with prop, fent - AM CXR   Fever -febrile 6/2, improved with APAP. No leukocytosis. Started empirically on vanc, cefepime and BCx, Tracheal aspirate sent -fever may be related to preceding seizure vs infection  P -PRN APAP  -Follow up cx data - trend WBC, fever curve  -Checking CDiff, with diarrhea present prior to admission   ESRD on HD  Hyperkalemia -received kayexalate on non-HD day P -HD per nephrology  -BID renal function panel   Hypertensive crisis, hx of difficult-to-control HTN -home meds: clonidine 0.3mg  BID, norvasc 10mg  qD, hydral 100mg  TID, imdur 30mg  qD, lopressor 10mg  BID  -No evidence of PRES on MRI  P  - SBP goal 140 - Cont home meds  - PRN hydral   DM, poorly controlled  -A1c 9.6 -With hyperglycemia to 565 on admission. Improved without insulin gtt now glucose 136 -Beta hydroxybutyric acid marginally elevated at 0.31, however AG now 16 without insulin gtt  P  -DM coordinator following  - will increase coverage from levemir 10 units BID + SSI to Levemir 12 units BID + SSI     Secondary hyperparathyroidism -TSH 2.43 on admit P  - no home medication on med list  Thrombocytopenia P -continue to trend CBC -no indication  to stop SQ Heparin at this time, plt >100   Hx prostate cancer -followed by [facility]  P -do not have nubeqa on formulary  Inadequate PO intake -intubated sedated, low-dose prop, hx DM and ESRD  P -EN per RDN   Loose Stools -present on admission, have increased in frequency x 24 hours P -BMS -check Cdiff   Best practice:  Diet: EN per RDN Pain/Anxiety/Delirium protocol (if indicated): Propofol, PRN fent  VAP protocol (if indicated): yes DVT prophylaxis: SQH GI prophylaxis: PPI Glucose control: SSI  + levemir  Mobility: BR Code Status: FULL  Family Communication: Pending 6/3 Disposition: ICU  Labs   CBC: Recent Labs  Lab 05/17/2020 2024 05/11/2020 2024 05/22/2020 2124 04/28/20 0130 04/28/20 0500 04/28/20 0507 04/29/20 0153  WBC 5.3  --   --  4.7 3.9*  --  7.4  NEUTROABS 4.3  --   --   --   --   --   --   HGB 14.3   < > 14.6 13.5 13.3 13.9 10.6*  HCT 45.1   < > 43.0 41.2 40.8 41.0 34.1*  MCV 86.2  --   --  84.3 84.6  --  88.6  PLT 142*  --   --  166 176  --  103*   < > = values in this interval not displayed.    Basic Metabolic Panel: Recent Labs  Lab 04/28/20 0700 04/28/20 1027 04/28/20 1243 04/28/20 1835 04/28/20 2006  NA 136 132* 134* 135 136  K 4.0 6.8* 6.1* 5.4* 5.9*  CL 96* 92* 93* 94* 93*  CO2 24 17* 23 24 23   GLUCOSE 136* 291* 330* 280* 289*  BUN 40* 58* 61* 68* 79*  CREATININE 5.74* 8.10* 8.51* 8.97* 9.39*  CALCIUM 9.7 9.0 9.0 8.9 8.8*  MG  --   --   --  2.0  --   PHOS  --   --   --   --  5.1*   GFR: Estimated Creatinine Clearance: 7.5 mL/min (A) (by C-G formula based on SCr of 9.39 mg/dL (H)). Recent Labs  Lab 05/23/2020 2024 04/28/20 0130 04/28/20 0500 04/29/20 0153  WBC 5.3 4.7 3.9* 7.4    Liver Function Tests: Recent Labs  Lab 05/04/2020 2024 04/28/20 2006  AST 30  --   ALT 20  --   ALKPHOS 94  --   BILITOT 1.3*  --   PROT 7.3  --   ALBUMIN 3.9 3.0*   No results for input(s): LIPASE, AMYLASE in the last 168 hours. No results  for input(s): AMMONIA in the last 168 hours.  ABG    Component Value Date/Time   PHART 7.489 (H) 04/28/2020 0507   PCO2ART 39.0 04/28/2020 0507   PO2ART 223 (H) 04/28/2020 0507   HCO3 29.8 (H) 04/28/2020 0507   TCO2 31 04/28/2020 0507   ACIDBASEDEF 5.0 (H) 05/02/2020 2124   O2SAT 100.0 04/28/2020 0507     Coagulation Profile: No results for input(s): INR, PROTIME in the last 168 hours.  Cardiac Enzymes: No results for input(s): CKTOTAL, CKMB, CKMBINDEX, TROPONINI in the last 168 hours.  HbA1C: Hemoglobin A1C  Date/Time Value Ref Range Status  10/30/2018 04:39 PM 7.2 (A) 4.0 - 5.6 % Final   Hgb A1c MFr Bld  Date/Time Value Ref Range Status  04/28/2020 01:30 AM 9.6 (H) 4.8 - 5.6 % Final    Comment:    (NOTE) Pre diabetes:          5.7%-6.4% Diabetes:              >6.4% Glycemic control for   <7.0% adults with diabetes   02/13/2018 02:08 PM 6.3 (H) 4.8 - 5.6 % Final    Comment:    (NOTE) Pre diabetes:          5.7%-6.4% Diabetes:              >6.4% Glycemic control for   <7.0% adults with diabetes     CBG: Recent Labs  Lab 04/28/20 1740 04/28/20 1910 04/28/20 2322 04/29/20 0325 04/29/20 0720  GLUCAP 265* 286* 240* 238* 199*    CRITICAL CARE Performed by: Cristal Generous   Total critical care time: 40 minutes  Critical care time was exclusive of separately billable procedures and treating other patients. Critical care was necessary to treat or prevent imminent or life-threatening deterioration.  Critical care was time spent personally by me on the following activities: development of treatment plan with patient and/or surrogate as well as nursing, discussions with consultants, evaluation of patient's response to treatment, examination of patient, obtaining history from patient or surrogate, ordering and performing treatments and interventions, ordering and review  of laboratory studies, ordering and review of radiographic studies, pulse oximetry and  re-evaluation of patient's condition.   Eliseo Gum MSN, AGACNP-BC [place] [phone] If no answer, [phone] 04/29/2020, 7:41 AM

## 2020-04-29 NOTE — Procedures (Signed)
Patient Name: Jared [name].  MRN: [id number]  Epilepsy Attending: Lora Havens  Referring Physician/Provider: Eliseo Gum, NP Date: 04/28/2020 Duration: 24.21 minutes  Patient history: 64 year old male with history of seizures on Keppra was noted to have further seizure-like activity.  EEG evaluate for seizures.  Level of alertness: comatose  AEDs during EEG study: Keppra, Ativan, propofol  Technical aspects: This EEG study was done with scalp electrodes positioned according to the 10-20 International system of electrode placement. Electrical activity was acquired at a sampling rate of 500Hz  and reviewed with a high frequency filter of 70Hz  and a low frequency filter of 1Hz . EEG data were recorded continuously and digitally stored.   Description: Seven seizures without clinical signs arising from left hemisphere, lasting about 20 seconds on an average were noted during the study, last seizure at 1913.  During the seizure EEG showed sharply contoured polymorphic 6 to 8 Hz theta-alpha activity which gradually evolved into 2 to 3 Hz delta activity and involve the right hemisphere.  EEG also showed polyspikes which at times appeared generalized and at other times appeared bilaterally independent lasting 2 to 3 seconds alternating with generalized background suppression lasting 2-4 seconds. Hyperventilation and photic stimulation were not performed.     ABNORMALITY -Seizure without clinical signs, left hemisphere -Polyspikes, generalized and bilateral independent -Background suppression, generalized  IMPRESSION: This study showed seven seizures without clinical signs arising from left hemisphere, lasting 20 seconds on an average, last seizure at [place] on 04/28/2020.  EEG also showed generalized and bilateral independent left and right hemisphere epileptogenicity.  Additionally there is evidence of severe diffuse encephalopathy, nonspecific etiology. Consider long-term EEG if concern for  ictal-interictal activity persists.  [name]

## 2020-04-29 NOTE — Consult Note (Signed)
NEURO HOSPITALIST CONSULT NOTE   Requestig physician: Dr. Vaughan Browner  Reason for Consult: Seizures   History obtained from:  Chart     HPI:                                                                                                                                         Celeste Candelas. is a 64 y.o. male with past medical history of thyroid disease, stroke, seizures originally on 250 mg twice daily of Keppra, hypertension, diabetes and chronic kidney disease. Patient's last dialysis was on Saturday 5/29, but was abbreviated. When he returned to [facility], the patient complained of nausea, vomiting and diarrhea. Due to the symptoms it prevented him from going to dialysis on 6/1 and he was rescheduled for 6/2. However, he never made it to dialysis as, still on 6/1, he was found at home to be unresponsive. EMS was called he was brought to the emergency department. He remained unresponsive and thus was intubated for airway protection. At that time his potassium was 7.5, bicarb 18, glucose 565, creatinine 12.6, BUN 939 and systolic blood pressure was severely elevated in the 220s. He was then transferred to the ICU.  Patient remained unresponsive and there was question of possible seizure. Spot EEG was ordered stat by PCCM unfortunately the results, for unknown reason, were not passed on to epileptologist post procedure, thus EEG was not read until the next morning.   The spot EEG did show sevenseizures arising from the left hemisphere, without associated clinical signs; the electrographic seizures lasted 20 seconds each on average,last seizure was at [place] on 04/28/2020. EEG also showed generalized and bilateral independent left and right hemisphere epileptogenicity.   At home, the patient has been on low-dose Keppra at 250 mg twice daily. On 04/29/2020, due to question of seizure, he was administered a supplemental 1 g load of Keppra followed by maintenance dose of Keppra 1000  mg twice daily. Currently, the patient is on propofol and receiving dialysis.  Today's labs: Phosphorus 6.8 Calcium 8.7 Sodium 136  Past Medical History:  Diagnosis Date  . Anemia   . Cancer [facility])    early prostate cancer per patient  . Chronic kidney disease    on dialysis M-W-F  . Diabetes mellitus without complication ([place])    onset as adult  . GERD (gastroesophageal reflux disease)   . Hypertension   . Seizures ([place])    pt states he thinks this is bc of his blood sugar  . Stroke [facility])    patient denies  . Thyroid disease    hyperparathyroidism    Past Surgical History:  Procedure Laterality Date  . AV FISTULA PLACEMENT Left 06/04/2014   Procedure: ARTERIOVENOUS (AV) FISTULA CREATION-  BRACHIOCEPHALIC WITH LIGATION OF COMPETEING BRANCH;  Surgeon: Mal Misty, MD;  Location: [place];  Service: Vascular;  Laterality: Left;  . INGUINAL HERNIA REPAIR Left 02/19/2018   Procedure: LAPAROSCOPIC LEFT  INGUINAL HERNIA;  Surgeon: Ralene Ok, MD;  Location: [place];  Service: General;  Laterality: Left;  . INSERTION OF DIALYSIS CATHETER  04/2014  . INSERTION OF MESH Left 02/19/2018   Procedure: INSERTION OF MESH;  Surgeon: Ralene Ok, MD;  Location: [place];  Service: General;  Laterality: Left;  . IR GENERIC HISTORICAL Left 11/30/2016   IR THROMBECTOMY AV FISTULA W/THROMBOLYSIS/PTA/STENT INC/SHUNT/IMG LT 11/30/2016 Markus Daft, MD [facility]-INTERV RAD  . IR GENERIC HISTORICAL  11/30/2016   IR US GUIDE VASC ACCESS LEFT 11/30/2016 Markus Daft, MD [facility]-INTERV RAD  . IR PARACENTESIS  08/21/2018    Family History  Problem Relation Age of Onset  . Cancer - Lung Mother   . Cancer - Other Father   . Diabetes Neg Hx               Social History:  reports that he quit smoking about 35 years ago. He has never used smokeless tobacco. He reports current alcohol use of about 1.0 standard drinks of alcohol per week. He reports that he does not use drugs.  Allergies  Allergen Reactions  . Penicillins  Other (See Comments)    Tolerates cephalosporins  UNSPECIFIED REACTION  Has patient had a PCN reaction causing immediate rash, facial/tongue/throat swelling, SOB or lightheadedness with hypotension: Unk Has patient had a PCN reaction causing severe rash involving mucus membranes or skin necrosis: Unk Has patient had a PCN reaction that required hospitalization: Unk Has patient had a PCN reaction occurring within the last 10 years: Unk If all of the above answers are "NO", then may proceed with Cephalosporin use.     MEDICATIONS:                                                                                                                     Prior to Admission:  Medications Prior to Admission  Medication Sig Dispense Refill Last Dose  . amLODipine (NORVASC) 10 MG tablet Take 1 tablet (10 mg total) by mouth daily. (Patient taking differently: Take 10 mg by mouth at bedtime. ) 30 tablet 0 04/26/2020  . atorvastatin (LIPITOR) 20 MG tablet TAKE 1 TABLET BY MOUTH ONCE DAILY (Patient taking differently: Take 20 mg by mouth every evening. ) 90 tablet 0 04/26/2020  . cinacalcet (SENSIPAR) 90 MG tablet Take 90 mg by mouth every evening.   04/26/2020  . hydrALAZINE (APRESOLINE) 25 MG tablet Take 25 mg by mouth 3 (three) times daily.   04/26/2020  . insulin NPH Human (NOVOLIN N) 100 UNIT/ML injection Inject 20 units every morning (Patient taking differently: Inject 30 Units into the skin daily before breakfast. ) 10 mL 5 04/26/2020  . isosorbide mononitrate (IMDUR) 30 MG 24 hr tablet Take 1 tablet by mouth once daily (Patient taking differently: Take 30 mg by mouth daily. ) 30 tablet 0 04/26/2020  .  labetalol (NORMODYNE) 200 MG tablet Take 200 mg by mouth 2 (two) times daily.   04/26/2020  . lidocaine-prilocaine (EMLA) cream Apply 1 application topically See admin instructions. Apply 1-2 hours before dialysis and cover with occlusive dressing (saran wrap).   04/24/2020  . LOKELMA 10 g PACK packet Take 1 packet  by mouth daily.   04/26/2020  . NUBEQA 300 MG tablet Take 300-600 mg by mouth See admin instructions. 673m in the morning and 3079min the evening   04/26/2020  . pantoprazole (PROTONIX) 40 MG tablet TAKE 1 TABLET BY MOUTH ONCE DAILY AT 6AM (Patient taking differently: Take 40 mg by mouth 2 (two) times daily. ) 90 tablet 1 04/26/2020  . sevelamer carbonate (RENVELA) 800 MG tablet Take 3 tablets (2,400 mg total) by mouth 3 (three) times daily with meals. 240 tablet 1 04/26/2020  . venlafaxine (EFFEXOR) 50 MG tablet Take 50 mg by mouth at bedtime.   04/26/2020  . Vitamin D, Ergocalciferol, (DRISDOL) 1.25 MG (50000 UNIT) CAPS capsule Take 50,000 Units by mouth once a week. Sundays   04/25/2020  . levETIRAcetam (KEPPRA) 250 MG tablet Take 1 tablet by mouth twice daily (Patient not taking: No sig reported) 180 tablet 1 Not Taking at Unknown time  . metoprolol tartrate (LOPRESSOR) 100 MG tablet Take 1 tablet (100 mg total) by mouth 2 (two) times daily. (Patient not taking: Reported on 04/28/2020) 60 tablet 1 Not Taking at Unknown time  . ondansetron (ZOFRAN ODT) 4 MG disintegrating tablet Take 1 tablet (4 mg total) by mouth every 8 (eight) hours as needed for nausea or vomiting. (Patient not taking: Reported on 04/28/2020) 20 tablet 0 Not Taking at Unknown time   Scheduled: . amLODipine  10 mg Per Tube Daily  . calcium gluconate  1 g Intravenous Once  . chlorhexidine gluconate (MEDLINE KIT)  15 mL Mouth Rinse BID  . Chlorhexidine Gluconate Cloth  6 each Topical Q0600  . cloNIDine  0.3 mg Per Tube BID  . docusate  100 mg Oral BID  . feeding supplement (PRO-STAT SUGAR FREE 64)  30 mL Per Tube TID  . heparin  5,000 Units Subcutaneous Q8H  . hydrALAZINE  100 mg Per Tube Q8H  . insulin aspart  0-15 Units Subcutaneous Q4H  . insulin aspart  2 Units Subcutaneous Q4H  . insulin detemir  12 Units Subcutaneous BID  . mouth rinse  15 mL Mouth Rinse 10 times per day  . metoprolol tartrate  100 mg Per Tube BID  .  pantoprazole (PROTONIX) IV  40 mg Intravenous QHS  . sodium bicarbonate  50 mEq Intravenous Once  . valproic acid  400 mg Per Tube TID   Continuous: . ceFEPime (MAXIPIME) IV    . feeding supplement (VITAL 1.5 CAL) 1,000 mL (04/28/20 1600)  . levETIRAcetam Stopped (04/28/20 2316)  . propofol (DIPRIVAN) infusion 30 mcg/kg/min (04/29/20 1000)  . valproate sodium    . vancomycin 750 mg (04/29/20 1213)     ROS:  Unable to obtain due to AMS.    Blood pressure 93/69, pulse 85, temperature 98.1 F (36.7 C), temperature source Axillary, resp. rate (!) 27, height _0  (1.727 m), weight 77 kg, SpO2 99 %.   General Examination:                                                                                                      Constitutional: Appears well-developed and well-nourished.  Eyes: No scleral injection HENT: No OP obstrucion Head: Normocephalic.  Cardiovascular: Normal rate and regular rhythm.  Respiratory: Effort normal, non-labored breathing GI: Soft. No distension. There is no tenderness.  Skin: WDI  Neuro: Mental Status: It should be noted patient is on propofol Currently patient is intubated, breathing over the vent, only shows response to noxious stimuli in his lower extremities.  However per nurse and nurse practitioner earlier during sternal rub he did lift his right arm and localized Cranial Nerves: II: No blink to threat. PERRL III,IV, VI: Cannot elicit a doll's response.   V: No corneals.   VII: Face appears symmetric.  VIII: No response to voice Motor: No response to pain in bilateral upper extremities.  Flaccid bilateral upper extremities.  With noxious stimuli at the toes and plantar surfaces of feet, the patient does withdraw; however, this appears most consistent with a triple flexion reflex. Sensory: In lower extremities he  does respond to noxious stimuli Deep Tendon Reflexes: 2+ and symmetric in the biceps and 1+ patellae.  Plantars: Toes are downgoing bilaterally.  Cerebellar/Gait: Unable to examine  Labs I have reviewed labs in epic and the results pertinent to this consultation are:   Lab Results: Basic Metabolic Panel: Recent Labs  Lab 04/28/20 1027 04/28/20 1027 04/28/20 1243 04/28/20 1243 04/28/20 1835 04/28/20 2006 04/29/20 0825  NA 132*  --  134*  --  135 136 136  K 6.8*  --  6.1*  --  5.4* 5.9* 4.3  CL 92*  --  93*  --  94* 93* 94*  CO2 17*  --  23  --  _1 GLUCOSE 291*  --  330*  --  280* 289* 226*  BUN 58*  --  61*  --  68* 79* 86*  CREATININE 8.10*  --  8.51*  --  8.97* 9.39* 9.45*  CALCIUM 9.0   < > 9.0   < > 8.9 8.8* 8.7*  MG  --   --   --   --  2.0  --   --   PHOS  --   --   --   --   --  5.1* 6.8*   < > = values in this interval not displayed.    CBC: Recent Labs  Lab 05/19/2020 2024 05/04/2020 2024 04/30/2020 2124 04/28/20 0130 04/28/20 0500 04/28/20 0507 04/29/20 0153  WBC 5.3  --   --  4.7 3.9*  --  7.4  NEUTROABS 4.3  --   --   --   --   --   --   HGB 14.3   < >  14.6 13.5 13.3 13.9 10.6*  HCT 45.1   < > 43.0 41.2 40.8 41.0 34.1*  MCV 86.2  --   --  84.3 84.6  --  88.6  PLT 142*  --   --  166 176  --  103*   < > = values in this interval not displayed.    Cardiac Enzymes: No results for input(s): CKTOTAL, CKMB, CKMBINDEX, TROPONINI in the last 168 hours.  Lipid Panel: Recent Labs  Lab 04/28/20 0130 04/28/20 0500 04/29/20 0153  TRIG 58 85 73    Imaging: CT Head Wo Contrast  Result Date: 04/28/2020 CLINICAL DATA:  Altered mental status EXAM: CT HEAD WITHOUT CONTRAST TECHNIQUE: Contiguous axial images were obtained from the base of the skull through the vertex without intravenous contrast. COMPARISON:  04/29/2018 FINDINGS: Brain: No evidence of acute infarction, hemorrhage, hydrocephalus, extra-axial collection or mass lesion/mass effect. Scattered  low-density changes within the periventricular and subcortical white matter compatible with chronic microvascular ischemic change. Mild-moderate diffuse cerebral volume loss. Vascular: Atherosclerotic calcifications involving the large vessels of the skull base. No unexpected hyperdense vessel. Skull: Normal. Negative for fracture or focal lesion. Sinuses/Orbits: No acute finding. Other: Partially visualized endotracheal and orogastric tubes. IMPRESSION: 1. No acute intracranial findings. 2. Chronic microvascular ischemic change and cerebral volume loss. Electronically Signed   By: Davina Poke D.O.   On: 04/28/2020 11:57   MR BRAIN WO CONTRAST  Result Date: 04/28/2020 CLINICAL DATA:  Initial evaluation for acute altered mental status, hypertensive crisis, seizure. EXAM: MRI HEAD WITHOUT CONTRAST TECHNIQUE: Multiplanar, multiecho pulse sequences of the brain and surrounding structures were obtained without intravenous contrast. COMPARISON:  Prior head CT from earlier the same day. FINDINGS: Brain: Mildly advanced age-related cerebral atrophy. No significant cerebral white matter disease for age. No abnormal foci of restricted diffusion to suggest acute or subacute ischemia. Gray-white matter differentiation maintained. No encephalomalacia to suggest chronic cortical infarction. No foci of susceptibility artifact to suggest acute or chronic intracranial hemorrhage. No mass lesion, midline shift or mass effect. Ventricles normal size without hydrocephalus. No extra-axial fluid collection. No intrinsic temporal lobe abnormality. No findings to suggest PRES. Pituitary gland suprasellar region within normal limits. Midline structures intact. Vascular: Major intracranial vascular flow voids are maintained. Skull and upper cervical spine: Craniocervical junction within normal limits. Bone marrow signal intensity normal. No scalp soft tissue abnormality. Sinuses/Orbits: Globes and orbital soft tissues within normal  limits. Mild scattered mucosal thickening noted throughout the paranasal sinuses. Fluid seen layering within the nasopharynx. Small left greater than right mastoid effusions. Patient appears to be intubated. Other: None. IMPRESSION: 1. No acute intracranial abnormality.  No findings to suggest PRES. 2. Mildly advanced age-related cerebral atrophy. Electronically Signed   By: Jeannine Boga M.D.   On: 04/28/2020 23:57   DG CHEST PORT 1 VIEW  Result Date: 04/29/2020 CLINICAL DATA:  Acute respiratory failure EXAM: PORTABLE CHEST 1 VIEW COMPARISON:  There yesterday FINDINGS: Lower volumes but diminished pulmonary opacity, now most prominent behind the heart. Prominent heart size in the setting of lower lung volumes. The endotracheal tube tip is just below the clavicular heads and the orogastric tube reaches the stomach. No visible effusion or pneumothorax. IMPRESSION: 1. Lower lung volumes but improved pulmonary opacity. 2. Unremarkable hardware positioning. Electronically Signed   By: Monte Fantasia M.D.   On: 04/29/2020 08:10   DG Chest Port 1 View  Result Date: 04/28/2020 CLINICAL DATA:  Hypoxia EXAM: PORTABLE CHEST 1 VIEW COMPARISON:  Yesterday FINDINGS:  Endotracheal tube tip is just below the clavicular heads. The enteric tube reaches the stomach. Bilateral pneumonia. Lung inflation is mildly improved. Normal heart size and mediastinal contours. No visible effusion or pneumothorax IMPRESSION: Stable hardware positioning and bilateral pneumonia. Lung volumes are improved from yesterday. Electronically Signed   By: Monte Fantasia M.D.   On: 04/28/2020 08:51   DG Chest Portable 1 View  Result Date: 05/06/2020 CLINICAL DATA:  Check endotracheal tube placement, unresponsive with missed dialysis session EXAM: PORTABLE CHEST 1 VIEW COMPARISON:  12/13/2018 FINDINGS: Cardiac shadow is within normal limits. Endotracheal tube and gastric catheter are seen in satisfactory position. Diffuse increased perihilar  density is noted likely related to vascular congestion and pulmonary edema. Mild interstitial facial edema is noted as well. No focal confluent infiltrate is seen. No bony abnormality is noted. IMPRESSION: Changes consistent with vascular congestion and pulmonary edema. Tubes and lines as described. Electronically Signed   By: Inez Catalina M.D.   On: 05/15/2020 21:12   EEG adult  Result Date: 04/29/2020 Lora Havens, MD     04/29/2020  9:20 AM Patient Name: Jared [name]. MRN: [id number] Epilepsy Attending: Lora Havens Referring Physician/Provider: Eliseo Gum, NP Date: 04/28/2020 Duration: 24.21 minutes Patient history: 64 year old male with history of seizures on Keppra was noted to have further seizure-like activity.  EEG evaluate for seizures. Level of alertness: comatose AEDs during EEG study: Keppra, Ativan, propofol Technical aspects: This EEG study was done with scalp electrodes positioned according to the 10-20 International system of electrode placement. Electrical activity was acquired at a sampling rate of _0  and reviewed with a high frequency filter of _1  and a low frequency filter of _2 . EEG data were recorded continuously and digitally stored. Description: Seven seizures without clinical signs arising from left hemisphere, lasting about 20 seconds on an average were noted during the study, last seizure at 1913.  During the seizure EEG showed sharply contoured polymorphic 6 to 8 Hz theta-alpha activity which gradually evolved into 2 to 3 Hz delta activity and involve the right hemisphere.  EEG also showed polyspikes which at times appeared generalized and at other times appeared bilaterally independent lasting 2 to 3 seconds alternating with generalized background suppression lasting 2-4 seconds. Hyperventilation and photic stimulation were not performed.   ABNORMALITY -Seizure without clinical signs, left hemisphere -Polyspikes, generalized and bilateral independent -Background  suppression, generalized IMPRESSION: This study showed seven seizures without clinical signs arising from left hemisphere, lasting 20 seconds on an average, last seizure at [place] on 04/28/2020.  EEG also showed generalized and bilateral independent left and right hemisphere epileptogenicity.  Additionally there is evidence of severe diffuse encephalopathy, nonspecific etiology. Consider long-term EEG if concern for ictal-interictal activity persists. Lora Havens    Assessment: 64 year old male with ESRD on HD who was admitted to the ICU on 6/1 after presenting with hyperkalemia, acute hypoxic respiratory failure and acute pulmonary edema from volume overload in the setting of missed HD x 2. He required intubation and emergent HD. Also with hypertensive emergency secondary to the volume overload. He was uremic and acidotic, with an acute metabolic encephalopathy. EEG was obtained to rule out subclinical seizures, revealing 7 seizures arising from the left hemisphere, lasting 20 seconds each on average, without accompanying clinical signs.  1. Also noted on initial spot EEG: Generalized and bilateral independent left and right hemisphere epileptogenicity, as well as evidence of severe diffuse encephalopathy, nonspecific to etiology. 2. Exam findings are most consistent  with propofol sedation +/- an intrinsic encephalopathic process. No clinical seizure activity is seen.   Recommendations: 1. LTM EEG.  2. Due to CKD on HD, continue maximum dose in this setting of Keppra at 1000 mg IV qAM. Also putting in orders for supplemental dose of 500 mg IV in the afternoons following dialysis on dialysis days only.    3. Inpatient seizure precautions.  4. Correction of metabolic abnormalities and volume overload per CCM.  5. MRI brain: No acute intracranial abnormality. No findings to suggest PRES. Mildly advanced age-related cerebral atrophy.   Addendum: -- LTM EEG at 1239 reveals near continuous bilateral  independent seizures from left and right hemisphere without clinical signs, as well as generalized spikes and polyspikes. -- Valproic acid 1500 mg IV has been loaded.  -- Start scheduled valproic acid 400 mg IV TID -- Continue Keppra as outlined in the Recommendations above. -- Propofol being titrated up towards goal of cessation of electrographic seizure activity.   Addendum: -- LTM EEG reviewed again by Dr. Hortense Ramal, through 1400. Electrographic seizures are again seen. Dr. Hortense Ramal notified nurse to increase propofol to 50 mcg/hr as well as to  start Versed drip at 10 mL/hr.  45 minutes spent in the neurological evaluation and management of this critically ill patient.    Electronically signed: Dr. Kerney Elbe 04/29/2020, 9:58 AM

## 2020-04-29 NOTE — Care Plan (Signed)
Near continuous bilateral independent seizures from left and right hemisphere without clinical signs as well as generalized spikes and polyspikes.  Dr. [name] notified.  Jared [name] Barbra Sarks

## 2020-04-29 NOTE — Progress Notes (Addendum)
Update -- decision made to proceed with emergent central line placement. See subsequent procedure note.   PCCM Brief Prog Note  Patient continues to exhibit ongoing seizure activity. Propofol gtt increased and Midaz gtt initiated per neurology rec. Following these changes, pt has exhibited drop in BP -- likely in response to these medications.   Hypotension P -dc antihypertensives  -1L LR over 2 hrs - albumin - starting peripheral neo concomitantly while we volume resuscitate  -Have attempted to reach family for CVC consent but no answer. Will continue to attempt -- do not feel we should proceed with emergent CVC placement at this time, but may become so if requiring escalation in pressors    Eliseo Gum MSN, AGACNP-BC [place] [phone] If no answer, [phone] 04/29/2020, 4:22 PM

## 2020-04-29 NOTE — Progress Notes (Signed)
Inpatient Diabetes Program Recommendations  AACE/ADA: New Consensus Statement on Inpatient Glycemic Control (2015)  Target Ranges:  Prepandial:   less than 140 mg/dL      Peak postprandial:   less than 180 mg/dL (1-2 hours)      Critically ill patients:  140 - 180 mg/dL   Lab Results  Component Value Date   GLUCAP 199 (H) 04/29/2020   HGBA1C 9.6 (H) 04/28/2020    Review of Glycemic Control Results for Jared [name], Jared [name] (MRN [id number]) as of 04/29/2020 09:31  Ref. Range 04/28/2020 17:40 04/28/2020 19:10 04/28/2020 23:22 04/29/2020 03:25 04/29/2020 07:20  Glucose-Capillary Latest Ref Range: 70 - 99 mg/dL 265 (H) 286 (H) 240 (H) 238 (H) 199 (H)    Diabetes history:  DM2  Outpatient Diabetes medications:  NPH 30 units daily with breakfast  Current orders for Inpatient glycemic control:  Novolog 0-15 units q4h Levemit 12 units bid Vital 1.5 @ 76ml/hr  Inpatient Diabetes Program Recommendations:     Novolog 2-3 units tube feed coverage q4h.  Stop if feeds ar held or discontinued or cbg < 80 mg/dl  Thank you, Reche Dixon, RN, BSN Diabetes Coordinator Inpatient Diabetes Program [phone] (team pager from 8a-5p)

## 2020-04-29 NOTE — Progress Notes (Signed)
LTM EEG reviewed with Dr. Hortense Ramal. In burst suppression, but still not an optimal pattern.   BP 106/65 with MAP of 78.   Increasing Versed to 20 mg/hr.   Electronically signed: Dr. Kerney Elbe

## 2020-04-29 NOTE — Consult Note (Deleted)
Neurology Consultation  Reason for Consult: Seizure Referring Physician: Mannam  CC: Seizure  History is obtained from: Chart  HPI: Jared [name]. is a 64 y.o. male with past medical history of thyroid disease, stroke, seizures originally on 250 mg twice daily of Keppra, hypertension, diabetes and chronic kidney disease. Patient's last dialysis was on Saturday 5/29, but was abbreviated. When returned to [facility] patient complained of nausea vomiting and diarrhea. Due to the symptoms it prevented him to go to dialysis on 6/1 to which he was rescheduled for 6/2. However he never made it to dialysis as on 6/1 he was found at home to be unresponsive. EMS was called and patient was treated empirically for hyperkalemia and transferred to the emergency department. He remained unresponsive and thus was intubated for airway protection. At that time his potassium was 7.5, bicarb 18, glucose 565, creatinine 12.6, BUN 125 and blood pressure was systolically high in the 496P. He was then transferred to the ICU.  Patient remained unresponsive and there was question of possible seizure. Spot EEG was ordered stat by PCCM unfortunately the results, for unknown reason, were not passed on to epileptologist post procedure, thus EEG was not read until the next morning.   The spot EEG did show seven seizures without clinical signs arising from left hemisphere, lasting 20 seconds on an average, last seizure at [place] on 04/28/2020.  EEG also showed generalized and bilateral independent left and right hemisphere epileptogenicity.    On 04/29/2020 due to question of seizure and patient being on low-dose Keppra at 250 mg twice daily, patient was loaded with a 1 g load of Keppra followed by maintenance dose of Keppra 1000 mg twice daily.  Currently patient is on propofol receiving dialysis.  Today's lab: Phosphorus 6.8 Calcium 8.7 Sodium 136    Past Medical History:  Diagnosis Date  . Anemia   . Cancer [facility])     early prostate cancer per patient  . Chronic kidney disease    on dialysis M-W-F  . Diabetes mellitus without complication ([place])    onset as adult  . GERD (gastroesophageal reflux disease)   . Hypertension   . Seizures ([place])    pt states he thinks this is bc of his blood sugar  . Stroke [facility])    patient denies  . Thyroid disease    hyperparathyroidism    Family History  Problem Relation Age of Onset  . Cancer - Lung Mother   . Cancer - Other Father   . Diabetes Neg Hx    Social History:   reports that he quit smoking about 35 years ago. He has never used smokeless tobacco. He reports current alcohol use of about 1.0 standard drinks of alcohol per week. He reports that he does not use drugs.  Medications  Current Facility-Administered Medications:  .  acetaminophen (TYLENOL) 160 MG/5ML solution 650 mg, 650 mg, Per Tube, Q6H PRN, Anders Simmonds, MD, 650 mg at 04/28/20 2308 .  amLODipine (NORVASC) tablet 10 mg, 10 mg, Per Tube, Daily, Bowser, Grace E, NP, 10 mg at 04/28/20 1015 .  calcium gluconate inj 10% (1 g) URGENT USE ONLY!, 1 g, Intravenous, Once, Delma Post, MD .  ceFEPIme (MAXIPIME) 1 g in sodium chloride 0.9 % 100 mL IVPB, 1 g, Intravenous, Q24H, Erenest Blank, RPH .  chlorhexidine gluconate (MEDLINE KIT) (PERIDEX) 0.12 % solution 15 mL, 15 mL, Mouth Rinse, BID, Mannam, Praveen, MD, 15 mL at 04/29/20 0745 .  Chlorhexidine Gluconate  Cloth 2 % PADS 6 each, 6 each, Topical, Q0600, Justin Mend, MD, 6 each at 04/29/20 0530 .  cloNIDine (CATAPRES) tablet 0.3 mg, 0.3 mg, Per Tube, BID, Bowser, Laurel Dimmer, NP, 0.3 mg at 04/28/20 2243 .  dextrose 50 % solution 0-50 mL, 0-50 mL, Intravenous, PRN, Corey Harold, NP .  docusate (COLACE) 50 MG/5ML liquid 100 mg, 100 mg, Oral, BID, Corey Harold, NP, 100 mg at 04/28/20 1015 .  docusate sodium (COLACE) capsule 100 mg, 100 mg, Oral, BID PRN, Corey Harold, NP .  feeding supplement (PRO-STAT SUGAR FREE 64) liquid 30 mL, 30  mL, Per Tube, TID, Mannam, Praveen, MD, 30 mL at 04/29/20 0925 .  feeding supplement (VITAL 1.5 CAL) liquid 1,000 mL, 1,000 mL, Per Tube, Continuous, Mannam, Praveen, MD, Last Rate: 50 mL/hr at 04/28/20 1600, 1,000 mL at 04/28/20 1600 .  fentaNYL (SUBLIMAZE) injection 50 mcg, 50 mcg, Intravenous, Q15 min PRN, Corey Harold, NP .  fentaNYL (SUBLIMAZE) injection 50-200 mcg, 50-200 mcg, Intravenous, Q30 min PRN, Corey Harold, NP .  heparin injection 5,000 Units, 5,000 Units, Subcutaneous, Q8H, Corey Harold, NP, 5,000 Units at 04/29/20 0762 .  hydrALAZINE (APRESOLINE) injection 10-40 mg, 10-40 mg, Intravenous, Q4H PRN, Bowser, Grace E, NP, 20 mg at 04/28/20 1047 .  hydrALAZINE (APRESOLINE) tablet 100 mg, 100 mg, Per Tube, Q8H, Bowser, Grace E, NP, 100 mg at 04/28/20 2244 .  insulin aspart (novoLOG) injection 0-15 Units, 0-15 Units, Subcutaneous, Q4H, Bowser, Laurel Dimmer, NP, 3 Units at 04/29/20 0746 .  insulin detemir (LEVEMIR) injection 12 Units, 12 Units, Subcutaneous, BID, Bowser, Laurel Dimmer, NP, 12 Units at 04/29/20 0925 .  levETIRAcetam (KEPPRA) IVPB 1000 mg/100 mL premix, 1,000 mg, Intravenous, Q24H, Bowser, Laurel Dimmer, NP, Stopped at 04/28/20 2316 .  LORazepam (ATIVAN) injection 2 mg, 2 mg, Intravenous, Q5 min PRN, Bowser, Laurel Dimmer, NP, 2 mg at 04/29/20 0856 .  MEDLINE mouth rinse, 15 mL, Mouth Rinse, 10 times per day, Scatliffe, Kristen D, MD, 15 mL at 04/29/20 0925 .  metoprolol tartrate (LOPRESSOR) tablet 100 mg, 100 mg, Per Tube, BID, Bowser, Laurel Dimmer, NP, 100 mg at 04/28/20 2243 .  pantoprazole (PROTONIX) injection 40 mg, 40 mg, Intravenous, QHS, Corey Harold, NP, 40 mg at 04/28/20 2243 .  polyethylene glycol (MIRALAX / GLYCOLAX) packet 17 g, 17 g, Oral, Daily PRN, Corey Harold, NP .  propofol (DIPRIVAN) 1000 MG/100ML infusion, 0-50 mcg/kg/min, Intravenous, Continuous, Corey Harold, NP, Last Rate: 13.05 mL/hr at 04/29/20 1000, 30 mcg/kg/min at 04/29/20 1000 .  sodium bicarbonate  injection 50 mEq, 50 mEq, Intravenous, Once, Delma Post, MD .  vancomycin (VANCOCIN) IVPB 750 mg/150 ml premix, 750 mg, Intravenous, Q T,Th,Sa-HD, Ledford, James L, RPH  ROS:  Unable to obtain due to altered mental status.   Exam: Current vital signs: BP 106/69 (BP Location: Right Arm)   Pulse 84   Temp 98.1 F (36.7 C) (Axillary)   Resp 20   Ht '5\' 8"'$  (1.727 m)   Wt 77 kg   SpO2 99%   BMI 25.81 kg/m  Vital signs in last 24 hours: Temp:  [97.8 F (36.6 C)-101.8 F (38.8 C)] 98.1 F (36.7 C) (06/03 0730) Pulse Rate:  [72-100] 84 (06/03 1015) Resp:  [16-31] 20 (06/03 1000) BP: (76-184)/(55-97) 106/69 (06/03 1015) SpO2:  [93 %-100 %] 99 % (06/03 1000) FiO2 (%):  [40 %] 40 % (06/03 0700) Weight:  [75.7 kg-77 kg] 77 kg (06/03 0730)  Constitutional: Appears well-developed and well-nourished.  Eyes: No scleral injection HENT: No OP obstrucion Head: Normocephalic.  Cardiovascular: Normal rate and regular rhythm.  Respiratory: Effort normal, non-labored breathing GI: Soft.  No distension. There is no tenderness.  Skin: WDI  Neuro: Mental Status:-It should be noted patient is on propofol Currently patient is intubated, breathing over the vent, only shows response to noxious stimuli in his lower extremities.  However per nurse and nurse practitioner earlier during sternal rub he did lift his right arm and localized Cranial Nerves: II: No blink to threat III,IV, VI: I cannot elicit his doll's response.  No corneals.  Pupils equal, round and reactive to light V: Facial sensation is symmetric to temperature VII: Facial appears symmetric.  VIII: No response to voice Motor: No response to pain in bilateral upper extremities.  Flaccid with no increased tone bilateral upper extremities.  With noxious stimuli at the toe and plantar surface patient does withdrawal however this appears more to be a triple reflex. Sensory: Lower extremity he does respond to noxious stimuli Deep  Tendon Reflexes: 2+ and symmetric in the biceps and 1+ patellae.  Plantars: Toes are downgoing bilaterally.  Cerebellar: Unable to examine  Labs I have reviewed labs in epic and the results pertinent to this consultation are:  CBC    Component Value Date/Time   WBC 7.4 04/29/2020 0153   RBC 3.85 (L) 04/29/2020 0153   HGB 10.6 (L) 04/29/2020 0153   HCT 34.1 (L) 04/29/2020 0153   PLT 103 (L) 04/29/2020 0153   MCV 88.6 04/29/2020 0153   MCH 27.5 04/29/2020 0153   MCHC 31.1 04/29/2020 0153   RDW 17.3 (H) 04/29/2020 0153   LYMPHSABS 0.7 04/29/2020 2024   MONOABS 0.2 05/04/2020 2024   EOSABS 0.0 05/15/2020 2024   BASOSABS 0.0 05/09/2020 2024    CMP     Component Value Date/Time   NA 136 04/29/2020 0825   K 4.3 04/29/2020 0825   CL 94 (L) 04/29/2020 0825   CO2 22 04/29/2020 0825   GLUCOSE 226 (H) 04/29/2020 0825   BUN 86 (H) 04/29/2020 0825   CREATININE 9.45 (H) 04/29/2020 0825   CREATININE 10.58 (H) 01/04/2017 1638   CALCIUM 8.7 (L) 04/29/2020 0825   PROT 7.3 05/08/2020 2024   ALBUMIN 2.9 (L) 04/29/2020 0825   AST 30 05/18/2020 2024   ALT 20 05/08/2020 2024   ALKPHOS 94 04/30/2020 2024   BILITOT 1.3 (H) 05/26/2020 2024   GFRNONAA 5 (L) 04/29/2020 0825   GFRAA 6 (L) 04/29/2020 0825    Lipid Panel     Component Value Date/Time   CHOL 150 03/14/2017 0237   TRIG 73 04/29/2020 0153   HDL 64 03/14/2017 0237   CHOLHDL 2.3 03/14/2017 0237   VLDL 7 03/14/2017 0237   LDLCALC 79 03/14/2017 0237   EEG- IMPRESSION: This study showed seven seizures without clinical signs arising from left hemisphere, lasting 20 seconds on an average, last seizure at [place] on 04/28/2020.  EEG also showed generalized and bilateral independent left and right hemisphere epileptogenicity.  Additionally there is evidence of severe diffuse encephalopathy, nonspecific etiology. Consider long-term EEG if concern for ictal-interictal activity persists.  Imaging I have reviewed the images  obtained:  CT-scan of the brain-no acute intracranial findings.  MRI examination of the brain-no acute intracranial abnormality.  No findings to suggest press.  Etta Quill PA-C [facility] [phone]  M-F  (9:00 am- 5:00 PM)  04/29/2020, 10:28 AM     Assessment:  This is a 64 year old male presenting to the hospital with hypertensive emergency, altered mental status requiring intubation for airway protection.  While in the ICU patient did have a witnessed seizure.  As noted above spot EEG was obtained which showed a 7 20-second seizures.  Patient was placed on Keppra 1000 mg twice daily.  At this point question if patient is suffering from nonclinical status epilepticus given the multiple seizures on the spot EEG and multiple breakthrough seizures in the setting of electrolyte abnormalities in the past.  Impression: -Multiple seizures on spot EEG -Question nonclinical status epilepticus  Recommendations: 1. Continue Keppra 100 mg twice daily 2.  LTM 3.  We'll make adjustments of AEDs depending on findings of LTM

## 2020-04-30 ENCOUNTER — Inpatient Hospital Stay (HOSPITAL_COMMUNITY): Payer: Medicare Other

## 2020-04-30 LAB — CALCIUM, IONIZED: Calcium, Ionized, Serum: 4.6 mg/dL (ref 4.5–5.6)

## 2020-04-30 LAB — CBC
HCT: 30.3 % — ABNORMAL LOW (ref 39.0–52.0)
Hemoglobin: 9.4 g/dL — ABNORMAL LOW (ref 13.0–17.0)
MCH: 27.8 pg (ref 26.0–34.0)
MCHC: 31 g/dL (ref 30.0–36.0)
MCV: 89.6 fL (ref 80.0–100.0)
Platelets: 93 10*3/uL — ABNORMAL LOW (ref 150–400)
RBC: 3.38 MIL/uL — ABNORMAL LOW (ref 4.22–5.81)
RDW: 17.3 % — ABNORMAL HIGH (ref 11.5–15.5)
WBC: 4.9 10*3/uL (ref 4.0–10.5)
nRBC: 0 % (ref 0.0–0.2)

## 2020-04-30 LAB — GLUCOSE, CAPILLARY
Glucose-Capillary: 125 mg/dL — ABNORMAL HIGH (ref 70–99)
Glucose-Capillary: 127 mg/dL — ABNORMAL HIGH (ref 70–99)
Glucose-Capillary: 117 mg/dL — ABNORMAL HIGH (ref 70–99)
Glucose-Capillary: 121 mg/dL — ABNORMAL HIGH (ref 70–99)
Glucose-Capillary: 202 mg/dL — ABNORMAL HIGH (ref 70–99)

## 2020-04-30 LAB — RENAL FUNCTION PANEL
Albumin: 2.7 g/dL — ABNORMAL LOW (ref 3.5–5.0)
Albumin: 2.5 g/dL — ABNORMAL LOW (ref 3.5–5.0)
Anion gap: 16 — ABNORMAL HIGH (ref 5–15)
Anion gap: 16 — ABNORMAL HIGH (ref 5–15)
BUN: 49 mg/dL — ABNORMAL HIGH (ref 8–23)
BUN: 57 mg/dL — ABNORMAL HIGH (ref 8–23)
CO2: 27 mmol/L (ref 22–32)
CO2: 25 mmol/L (ref 22–32)
Calcium: 8.8 mg/dL — ABNORMAL LOW (ref 8.9–10.3)
Calcium: 8.7 mg/dL — ABNORMAL LOW (ref 8.9–10.3)
Chloride: 95 mmol/L — ABNORMAL LOW (ref 98–111)
Chloride: 95 mmol/L — ABNORMAL LOW (ref 98–111)
Creatinine, Ser: 5.75 mg/dL — ABNORMAL HIGH (ref 0.61–1.24)
Creatinine, Ser: 6.13 mg/dL — ABNORMAL HIGH (ref 0.61–1.24)
GFR calc Af Amer: 11 mL/min — ABNORMAL LOW (ref >60)
GFR calc Af Amer: 10 mL/min — ABNORMAL LOW (ref >60)
GFR calc non Af Amer: 9 mL/min — ABNORMAL LOW (ref >60)
GFR calc non Af Amer: 9 mL/min — ABNORMAL LOW (ref >60)
Glucose, Bld: 126 mg/dL — ABNORMAL HIGH (ref 70–99)
Glucose, Bld: 185 mg/dL — ABNORMAL HIGH (ref 70–99)
Phosphorus: 5 mg/dL — ABNORMAL HIGH (ref 2.5–4.6)
Phosphorus: 5.3 mg/dL — ABNORMAL HIGH (ref 2.5–4.6)
Potassium: 3.8 mmol/L (ref 3.5–5.1)
Potassium: 4.1 mmol/L (ref 3.5–5.1)
Sodium: 138 mmol/L (ref 135–145)
Sodium: 136 mmol/L (ref 135–145)

## 2020-04-30 LAB — TRIGLYCERIDES: Triglycerides: 109 mg/dL (ref <150)

## 2020-04-30 MED ORDER — SODIUM CHLORIDE 0.9 % IV SOLN
1.0000 g | INTRAVENOUS | Status: AC
  Administered 2020-04-30 – 2020-05-03 (×4): 1 g via INTRAVENOUS
  Filled 2020-04-30 (×3): qty 1
  Filled 2020-04-30: qty 10

## 2020-04-30 MED ORDER — SODIUM CHLORIDE 0.9 % IV SOLN
100.0000 mg | Freq: Two times a day (BID) | INTRAVENOUS | Status: DC
  Administered 2020-04-30 – 2020-05-11 (×22): 100 mg via INTRAVENOUS
  Filled 2020-04-30 (×24): qty 10

## 2020-04-30 MED ORDER — PANTOPRAZOLE SODIUM 40 MG PO PACK
40.0000 mg | PACK | Freq: Every day | ORAL | Status: DC
  Administered 2020-04-30 – 2020-05-11 (×12): 40 mg
  Filled 2020-04-30 (×13): qty 20

## 2020-04-30 MED ORDER — CHLORHEXIDINE GLUCONATE CLOTH 2 % EX PADS: 6.0000 | MEDICATED_PAD | Freq: Every day | CUTANEOUS | Status: DC

## 2020-04-30 MED ORDER — LACTULOSE 10 GM/15ML PO SOLN: 10.0000 g | Freq: Three times a day (TID) | ORAL | Status: DC

## 2020-04-30 MED ORDER — METRONIDAZOLE IN NACL 5-0.79 MG/ML-% IV SOLN
500.0000 mg | Freq: Three times a day (TID) | INTRAVENOUS | Status: DC
  Administered 2020-04-30 – 2020-05-01 (×3): 500 mg via INTRAVENOUS
  Filled 2020-04-30 (×3): qty 100

## 2020-04-30 MED ORDER — MIDAZOLAM 50MG/50ML (1MG/ML) PREMIX INFUSION
40.0000 mg/h | Freq: Once | INTRAVENOUS | Status: DC
  Filled 2020-04-30 (×3): qty 50

## 2020-04-30 MED ORDER — SODIUM CHLORIDE 0.9 % IV SOLN: 400.0000 mg | INTRAVENOUS | Status: DC

## 2020-04-30 MED ORDER — SODIUM CHLORIDE 0.9 % IV SOLN
0.1000 mg/h | INTRAVENOUS | Status: DC
  Administered 2020-04-30: 30 mg/h via INTRAVENOUS
  Administered 2020-04-30: 50 mg/h via INTRAVENOUS
  Administered 2020-04-30 (×2): 40 mg/h via INTRAVENOUS
  Administered 2020-05-01 – 2020-05-02 (×3): 50 mg/h via INTRAVENOUS
  Filled 2020-04-30: qty 100
  Filled 2020-04-30 (×2): qty 50
  Filled 2020-04-30 (×2): qty 100
  Filled 2020-04-30: qty 50
  Filled 2020-04-30: qty 100
  Filled 2020-04-30: qty 50
  Filled 2020-04-30: qty 100
  Filled 2020-04-30 (×2): qty 50
  Filled 2020-04-30: qty 100
  Filled 2020-04-30: qty 50

## 2020-04-30 MED ORDER — SODIUM CHLORIDE 0.9 % IV SOLN
300.0000 mg | INTRAVENOUS | Status: AC
  Administered 2020-04-30: 300 mg via INTRAVENOUS
  Filled 2020-04-30: qty 30

## 2020-04-30 NOTE — Progress Notes (Addendum)
NAME:  Jared [name], Jared [name] MRN:  [id number], DOB:  1956/05/22, LOS: 3 ADMISSION DATE:  05/13/2020, CONSULTATION DATE: 6/1 REFERRING MD: Dr. Tyrone Nine EDP, CHIEF COMPLAINT: Unresponsiveness  Brief History   64 year old male with ESRD on HD unresponsive from home.  Intubated in ED.  Has missed 2 dialysis sessions.  Hyperkalemic and volume overloaded in the ED.  Admitted to ICU for emergent hemodialysis.  Past Medical History   has a past medical history of Anemia, Cancer ([place]), Chronic kidney disease, Diabetes mellitus without complication ([place]), GERD (gastroesophageal reflux disease), Hypertension, Seizures ([place]), Stroke ([place]), and Thyroid disease.  Significant Hospital Events   6/1 admit for emergent dialysis. Intubated 6/2 Seizures 6/4 remains in status epilepticus, shock due to heavy sedation.   Consults:  Nephrology Neurology  Procedures:  ETT 6/1> R Fem CVC 6/3>  Significant Diagnostic Tests:  CT head 6/1 > chronic microvascular ischemic changes, volume loss. No acute intracranial abnormalities CXR 6/2> improved pulmonary edema  MRI Brain 6/2> no acute intracranial abnormality, no findings suggestive of PRES.  CXR 6/3> lower lung volumes. 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 Bowel loops visible.   LTM> continues to demonstrate seizure activity, averaging 6-8/hr from L and R hemisphere   Micro Data:  6/1 SARS Cov2 > neg  6/1 MRSA> neg 6/3 tracheal aspirate> few GPC.  6/3 BCx> no growth < 12 hrs  6/3 CDiff>>neg  Antimicrobials:   6/3 vanc 6/3 cefepime (dc due to ongoing seizures)  6/4 rocephin and flagyl >> (PCN allergy jwith unknown reaction, cannot use unasyn. tx for possible PNA/aspiration PNA)   Interim history/subjective:  Escalating Versed and propofol doses per neurology  Objective   Blood pressure 120/68, pulse 81, temperature 98.5 F (36.9 C), temperature source Axillary, resp. rate 18, height 5\' 8"  (1.727 m), weight 78.2 kg, SpO2 100 %.      Vent Mode: PRVC FiO2 (%):  [40 %] 40 % Set Rate:  [14 bmp-18 bmp] 14 bmp Vt Set:  [550 mL] 550 mL PEEP:  [8 cmH20] 8 cmH20 Plateau Pressure:  [16 cmH20-23 cmH20] 20 cmH20   Intake/Output Summary (Last 24 hours) at 04/30/2020 0952 Last data filed at 04/30/2020 0800 Gross per 24 hour  Intake 4227.53 ml  Output 3000 ml  Net 1227.53 ml   Filed Weights   04/29/20 0730 04/29/20 1146 04/30/20 0500  Weight: 77 kg 74.4 kg 78.2 kg    Examination: General: Critically ill appearing older adult M, intubated, sedated, NAD  HENT: NCAT ETT OGT secure. EEG in place  Lungs: Symmetrical chest expansion. Clear upper lung sounds bilaterally. Scattered rhonchi R base  Cardiovascular: RRR s1s2 no rgm cap refill < 3 seconds  Abdomen: soft round hyperactive. BMS  Extremities: LUE with + thrill. R fem CVC dressing c/d/i. No obvious joint deformity  Neuro: Sedated, pinpoint pupils. Does not respond to painful stimuli  Resolved Hospital Problem list     Assessment & Plan:   Acute metabolic encephalopathy  In setting of uremia, initial hypoxia, acidosis. Also possible that initial encephalopathy related to seizure hx, did have witnessed seizure 6/2.  Ammonia mildly elevated, do not suspect greatly contributing  P -sedation for seizures as below  -continue to correct metabolic abnormalities as able   Seizure -MRI without evidence of PRES   -LTM without BS P - Neurology following - AEDs per neuro: Vimpat, Keppra, VPA  - Propofol, Versed gtt   Acute hypoxic respiratory failure requiring intubation -non-cardiogenic pulmonary edema improving. - R  sided opacity, possible PNA which could reflect aspiration from initial presentation -started on vanc/cefepime 6/3  P -Cont MV support. Deeply sedated for seizures, not candidate for weaning -changing abx to rocephin and flagyl for aspiration pna coverage (PCN allergy) for 5 days -VAP bundle -AM CXR -cont to trend WBC, fever curve   Fever, improved   -febrile 6/2, improved with APAP. No leukocytosis. Started empirically on vanc, cefepime and BCx, Tracheal aspirate sent -fever may be related to preceding seizure vs infection  P -PRN APAP   ESRD on HD  Hyperkalemia -received kayexalate on non-HD day P -HD per nephrology  -BID renal function panel   Hypotension -likely due to increasing propofol and versed gtt doses P -continue neo for MAP goal > 65   Hypertensive crisis, hx of difficult-to-control HTN -home meds: clonidine 0.3mg  BID, norvasc 10mg  qD, hydral 100mg  TID, imdur 30mg  qD, lopressor 10mg  BID  -No evidence of PRES on MRI  P  - off of antihypertensives at present due to hypotensive effects of sedating medications   DM, poorly controlled  -A1c 9.6 -With hyperglycemia to 565 on admission. Improved without insulin gtt now glucose 136 -Beta hydroxybutyric acid marginally elevated at 0.31, however AG now 16 without insulin gtt  P  -DM coordinator following  - Levemir 12 units BID + SSI     Secondary hyperparathyroidism -TSH 2.43 on admit P  - no home medication on med list  Thrombocytopenia Timeline does not align with HIT P -continue to trend CBC, no indication to stop SQH at this time   Hx prostate cancer -followed by [facility]  P  -do not have nubeqa on formulary  Inadequate PO intake -intubated sedated, low-dose prop, hx DM and ESRD  P -EN per RDN   Loose Stools -present on admission, have increased in frequency x 24 hours -CDiff neg P -BMS  Best practice:  Diet: EN per RDN Pain/Anxiety/Delirium protocol (if indicated): Prop, versed, prn fent  VAP protocol (if indicated): yes DVT prophylaxis: SQH GI prophylaxis: PPI Glucose control: SSI  + levemir  Mobility: BR Code Status: FULL  Family Communication: pending 6/4 Disposition: ICU  Labs   CBC: Recent Labs  Lab 05/25/2020 2024 05/01/2020 2124 04/28/20 0130 04/28/20 0500 04/28/20 0507 04/29/20 0153 04/30/20 0434  WBC 5.3  --  4.7 3.9*   --  7.4 4.9  NEUTROABS 4.3  --   --   --   --   --   --   HGB 14.3   < > 13.5 13.3 13.9 10.6* 9.4*  HCT 45.1   < > 41.2 40.8 41.0 34.1* 30.3*  MCV 86.2  --  84.3 84.6  --  88.6 89.6  PLT 142*  --  166 176  --  103* 93*   < > = values in this interval not displayed.    Basic Metabolic Panel: Recent Labs  Lab 04/28/20 1835 04/28/20 2006 04/29/20 0825 04/29/20 1819 04/30/20 0800  NA 135 136 136 139 138  K 5.4* 5.9* 4.3 3.4* 3.8  CL 94* 93* 94* 95* 95*  CO2 24 23 22 26 27   GLUCOSE 280* 289* 226* 80 126*  BUN 68* 79* 86* 35* 49*  CREATININE 8.97* 9.39* 9.45* 5.24* 5.75*  CALCIUM 8.9 8.8* 8.7* 9.0 8.8*  MG 2.0  --   --   --   --   PHOS  --  5.1* 6.8* 4.6 5.0*   GFR: Estimated Creatinine Clearance: 12.2 mL/min (A) (by C-G formula based on SCr  of 5.75 mg/dL (H)). Recent Labs  Lab 04/28/20 0130 04/28/20 0500 04/29/20 0153 04/30/20 0434  WBC 4.7 3.9* 7.4 4.9    Liver Function Tests: Recent Labs  Lab 05/15/2020 2024 05/06/2020 2024 04/28/20 2006 04/29/20 0825 04/29/20 1303 04/29/20 1819 04/30/20 0800  AST 30  --   --   --  49*  --   --   ALT 20  --   --   --  31  --   --   ALKPHOS 94  --   --   --  69  --   --   BILITOT 1.3*  --   --   --  2.0*  --   --   PROT 7.3  --   --   --  7.9  --   --   ALBUMIN 3.9   < > 3.0* 2.9* 3.3* 2.7* 2.7*   < > = values in this interval not displayed.   No results for input(s): LIPASE, AMYLASE in the last 168 hours. Recent Labs  Lab 04/29/20 1303  AMMONIA 41*    ABG    Component Value Date/Time   PHART 7.489 (H) 04/28/2020 0507   PCO2ART 39.0 04/28/2020 0507   PO2ART 223 (H) 04/28/2020 0507   HCO3 29.8 (H) 04/28/2020 0507   TCO2 31 04/28/2020 0507   ACIDBASEDEF 5.0 (H) 05/25/2020 2124   O2SAT 100.0 04/28/2020 0507     Coagulation Profile: No results for input(s): INR, PROTIME in the last 168 hours.  Cardiac Enzymes: No results for input(s): CKTOTAL, CKMB, CKMBINDEX, TROPONINI in the last 168 hours.  HbA1C: Hemoglobin  A1C  Date/Time Value Ref Range Status  10/30/2018 04:39 PM 7.2 (A) 4.0 - 5.6 % Final   Hgb A1c MFr Bld  Date/Time Value Ref Range Status  04/28/2020 01:30 AM 9.6 (H) 4.8 - 5.6 % Final    Comment:    (NOTE) Pre diabetes:          5.7%-6.4% Diabetes:              >6.4% Glycemic control for   <7.0% adults with diabetes   02/13/2018 02:08 PM 6.3 (H) 4.8 - 5.6 % Final    Comment:    (NOTE) Pre diabetes:          5.7%-6.4% Diabetes:              >6.4% Glycemic control for   <7.0% adults with diabetes     CBG: Recent Labs  Lab 04/29/20 1911 04/29/20 2008 04/29/20 2310 04/30/20 0314 04/30/20 0726  GLUCAP 57* 83 78 125* 127*    CRITICAL CARE Performed by: Cristal Generous  Total critical care time: 47 minutes  Critical care time was exclusive of separately billable procedures and treating other patients. Critical care was necessary to treat or prevent imminent or life-threatening deterioration.  Critical care was time spent personally by me on the following activities: development of treatment plan with patient and/or surrogate as well as nursing, discussions with consultants, evaluation of patient's response to treatment, examination of patient, obtaining history from patient or surrogate, ordering and performing treatments and interventions, ordering and review of laboratory studies, ordering and review of radiographic studies, pulse oximetry and re-evaluation of patient's condition.  Eliseo Gum MSN, AGACNP-BC [place] [phone] If no answer, [phone] 04/30/2020, 9:52 AM

## 2020-04-30 NOTE — Progress Notes (Signed)
Patient was hypothermic at 93.8 rectally. Bair hugger applied. [place] MD notified. Will continue to monitor closely.

## 2020-04-30 NOTE — Plan of Care (Signed)
Talked to Dr Genevive Bi elink for pressor support. PCCM will follow.  Increase versed to 30/hr - RN made aware.  -- Amie Portland, MD [facility] Pager: [phone] If 7pm to 7am, please call on call as listed on AMION.

## 2020-04-30 NOTE — Progress Notes (Signed)
[facility] Progress Note  Subjective: pt seen in iCU, 3 L off w/ HD yesterday. BP are in normal range. EEG yest positive and pt started on propofol and then Versed drips.    Vitals:   04/30/20 0700 04/30/20 0800 04/30/20 0805 04/30/20 0900  BP: 110/65 117/68 117/68 120/68  Pulse: 88 85 84 81  Resp: _0 Temp: 98.5 F (36.9 C)     TempSrc: Axillary     SpO2: 99% 99% 99% 100%  Weight:      Height:        Exam:   On vent and sedated  no jvd  Chest cta bilat  Cor reg no RG  Abd soft ntnd no ascites   Ext no LE edema   Alert, NF, ox3   LUA aVF+bruit    OP HD: TTS NW  4h  400/800  2/2 bath 72.5kg  Hep 2300   - hect 7 ug   CXR 6/3 today - improved pulm infiltrates/ opacities  Assessment/ Plan: 1. Seizures - non-convulsive seizures per EEG. Started on propofol and Versed. Per neuro/ pmd. MRI negative, no evidence of PRES.  2. Fever/ pulm infiltrates - possible PNA improving. Getting IV abx 3. AMS: admit creat 13.12, poss some element of uremia, creat down 5- 6 range now w/ HD.  4. Acute hypoxic resp failure: CXR clearing, on vent per CCM 5. ESRD - on HD TTS. HD tomorrow.  6. HTN/ volume: no vol excess on exam, BP's labile, still vol up a bit 7. Secondary hyperPTH: cont outpt hectorol dose   Rob Courvoisier Hamblen 04/30/2020, 10:41 AM   Recent Labs  Lab 04/29/20 0153 04/29/20 0825 04/29/20 1819 04/30/20 0434 04/30/20 0800  K  --    < > 3.4*  --  3.8  BUN  --    < > 35*  --  49*  CREATININE  --    < > 5.24*  --  5.75*  CALCIUM  --    < > 9.0  --  8.8*  PHOS  --    < > 4.6  --  5.0*  HGB 10.6*  --   --  9.4*  --    < > = values in this interval not displayed.   Inpatient medications: . calcium gluconate  1 g Intravenous Once  . chlorhexidine gluconate (MEDLINE KIT)  15 mL Mouth Rinse BID  . Chlorhexidine Gluconate Cloth  6 each Topical Q0600  . docusate  100 mg Per Tube BID  . feeding supplement (PRO-STAT SUGAR FREE 64)  30 mL Per Tube TID  . heparin   5,000 Units Subcutaneous Q8H  . insulin aspart  0-15 Units Subcutaneous Q4H  . insulin detemir  12 Units Subcutaneous BID  . mouth rinse  15 mL Mouth Rinse 10 times per day  . pantoprazole sodium  40 mg Per Tube QHS  . sodium bicarbonate  50 mEq Intravenous Once  . valproic acid  400 mg Per Tube TID   . sodium chloride Stopped (04/29/20 2339)  . cefTRIAXone (ROCEPHIN)  IV    . feeding supplement (VITAL 1.5 CAL) 1,000 mL (04/28/20 1600)  . lacosamide (VIMPAT) IV    . lacosamide (VIMPAT) IV    . levETIRAcetam Stopped (04/29/20 2228)  . levETIRAcetam Stopped (04/29/20 1848)  . metronidazole    . midazolam 40 mg/hr (04/30/20 0800)  . phenylephrine (NEO-SYNEPHRINE) Adult infusion 25 mcg/min (04/30/20 1011)  . propofol (DIPRIVAN) infusion 60 mcg/kg/min (04/30/20 0824)  acetaminophen (TYLENOL) oral liquid 160 mg/5 mL, dextrose, docusate sodium, fentaNYL (SUBLIMAZE) injection, fentaNYL (SUBLIMAZE) injection, hydrALAZINE, polyethylene glycol

## 2020-04-30 NOTE — Progress Notes (Addendum)
Hypoglycemic Event  CBG: 57  Treatment: D50 25 mL (12.5 gm)  Symptoms: None  Follow-up CBG: Time: 2008 CBG Result: 83  Possible Reasons for Event: Unknown   Antonieta Pert, RN

## 2020-04-30 NOTE — Progress Notes (Signed)
Late Entry  LTM maint complete - no skin breakdown under: CZ, P8,O1,F7,FP2 no skin breakdown

## 2020-04-30 NOTE — Progress Notes (Signed)
EEG still not complete BS pattern  Recs: Increase versed to 40mg /hr Increase propofol to 88mc/kg/min  - -- Amie Portland, MD [facility] Pager: [phone] If 7pm to 7am, please call on call as listed on AMION.

## 2020-04-30 NOTE — Procedures (Addendum)
Patient Name: Jared [name].  MRN: [id number]  Epilepsy Attending: Lora Havens  Referring Physician/Provider: Eliseo Gum, NP Duration: 04/29/2020 1113 to 04/30/2020 1113  Patient history: 64 year old male with history of seizures on Keppra was noted to have further seizure-like activity.  EEG evaluate for seizures.  Level of alertness: comatose  AEDs during EEG study: Keppra, VPA, versed, propofol  Technical aspects: This EEG study was done with scalp electrodes positioned according to the 10-20 International system of electrode placement. Electrical activity was acquired at a sampling rate of 500Hz  and reviewed with a high frequency filter of 70Hz  and a low frequency filter of 1Hz . EEG data were recorded continuously and digitally stored.   Description:  At the onset of the study, EEG showed seizures without clinical signs, avg 6-8/hour, arising from left and right hemisphere, lasting about 20 seconds on an average.  After around 1400 on 04/29/2020, EEG showed discontinuous pattern with 3-6 seconds of bursts of generalized as well as bilateral independent polyspikes alternating with 3-5 seconds of background suppression.  Hyperventilation and photic stimulation were not performed.     ABNORMALITY -Seizure without clinical signs, left and right hemisphere -Polyspikes, generalized and bilateral independent -Background suppression, generalized  IMPRESSION: This study  initially showed seizures without clinical signs, average 6-8/ hour arising from left and right hemisphere, lasting 20 seconds on an average.  After around 1400 on 12/31/2019, EEG showed evidence of generalized and bilateral independent left and right hemisphere epileptogenicity.  Additionally there is evidence of severe diffuse encephalopathy, nonspecific etiology.    Daylen Lipsky Barbra Sarks

## 2020-04-30 NOTE — Plan of Care (Signed)
LTM reviewed. BS pattern with still suboptimal pattern. Waiting to speak with PCCM before increasing versed since pressures were soft. Left message with Ambulance person.  -- Amie Portland, MD [facility] Pager: [phone] If 7pm to 7am, please call on call as listed on AMION.

## 2020-04-30 NOTE — Progress Notes (Signed)
[facility] Progress Note Patient Name: Jared [name]. DOB: [birth year] MRN: [id number]   Date of Service  04/30/2020  HPI/Events of Note  Checked on patient as got endorsement that patient may need more pressors as going up on benzodiazepines for seizures.  eICU Interventions  Patient seen doing ok on low dose neosynephrine. Spoke with bedside nurse who will inform eLink if patient is need of additional pressor.     Intervention Category Major Interventions: Hypotension - evaluation and management  Judd Lien 04/30/2020, 1:53 AM

## 2020-04-30 NOTE — Progress Notes (Signed)
Subjective: Overnight sedation was titrated up due to persistent epileptiform discharges on EEG.  Patient's daughter at bedside states his Keppra was stopped about 2 weeks ago by his neurologist as he had not had any seizures.    ROS: Unable to obtain due to poor mental status  Examination  Vital signs in last 24 hours: Temp:  [93.8 F (34.3 C)-98.5 F (36.9 C)] 98.5 F (36.9 C) (06/04 1100) Pulse Rate:  [61-98] 78 (06/04 1200) Resp:  [17-21] 18 (06/04 1200) BP: (70-140)/(49-77) 124/69 (06/04 1200) SpO2:  [97 %-100 %] 100 % (06/04 1200) FiO2 (%):  [40 %] 40 % (06/04 1102) Weight:  [78.2 kg] 78.2 kg (06/04 0500)  General: lying in bed, not in apparent distress CVS: pulse-normal rate and rhythm RS: breathing comfortably, intubated Extremities: normal, warm  Neuro: MS: Comatose, does not open eyes to noxious stimuli, does not follow commands CN: Pupils small and difficult to appreciate any reactivity, corneal reflex absent, gag reflex absent  Motor: Flaccid, does not withdraw to noxious stimuli in bilateral upper extremities, 2/5 in bilateral lower extremities   Basic Metabolic Panel: Recent Labs  Lab 04/28/20 1835 04/28/20 1835 04/28/20 2006 04/28/20 2006 04/29/20 0825 04/29/20 1819 04/30/20 0800  NA 135  --  136  --  136 139 138  K 5.4*  --  5.9*  --  4.3 3.4* 3.8  CL 94*  --  93*  --  94* 95* 95*  CO2 24  --  23  --  22 26 27   GLUCOSE 280*  --  289*  --  226* 80 126*  BUN 68*  --  79*  --  86* 35* 49*  CREATININE 8.97*  --  9.39*  --  9.45* 5.24* 5.75*  CALCIUM 8.9   < > 8.8*   < > 8.7* 9.0 8.8*  MG 2.0  --   --   --   --   --   --   PHOS  --   --  5.1*  --  6.8* 4.6 5.0*   < > = values in this interval not displayed.    CBC: Recent Labs  Lab 05/09/2020 2024 04/28/2020 2124 04/28/20 0130 04/28/20 0500 04/28/20 0507 04/29/20 0153 04/30/20 0434  WBC 5.3  --  4.7 3.9*  --  7.4 4.9  NEUTROABS 4.3  --   --   --   --   --   --   HGB 14.3   < > 13.5 13.3 13.9 10.6*  9.4*  HCT 45.1   < > 41.2 40.8 41.0 34.1* 30.3*  MCV 86.2  --  84.3 84.6  --  88.6 89.6  PLT 142*  --  166 176  --  103* 93*   < > = values in this interval not displayed.     Coagulation Studies: No results for input(s): LABPROT, INR in the last 72 hours.  Imaging MRI brain without contrast 04/28/2020: No acute abnormality.   ASSESSMENT AND PLAN: 64 year old male with end-stage renal disease on hemodialysis who was admitted on 6/1 after presenting with hyperkalemia, acute hypoxic failure and pulmonary edema from volume overload in the setting of missed hemodialysis x2.  EEG was obtained as part of encephalopathy work-up and showed 7 seizures without clinical signs arising from left hemisphere.  Patient was started on long-term EEG and had multiple seizures (average 6 to 8/hr) arising from left and right hemisphere.  Keppra dose was increased and Depakote was added.  However seizures persisted and  therefore patient was started on Versed and eventually propofol was added.     Nonconvulsive seizures, left and right hemisphere Acute encephalopathy, secondary to seizures and medications End-stage renal disease on hemodialysis Acute respiratory failure due to pulmonary edema Hyperphosphatemia Hypoalbuminemia Hyperglycemia Microcytic anemia Thrombocytopenia Transaminitis Hyperbilirubinemia Hyper ammonemia -EEG is improved overnight with no definite seizures however continues to show generalized independent left and right epileptiform discharges. -Etiology for seizures: Patient's Keppra was stopped, possible toxicity from missing hemodialysis   Recommendations -As patient continues to have epileptiform discharges, will load with Vimpat 300 mg and start 100 mg twice daily -Continue valproic acid 400 mg every 8 hours.  Of note, patient is thrombocytopenic and already mildly hyperammonemic therefore avoid increasing dose any further -Continue Keppra 1000 mg twice daily with an additional 500  mg dose after hemodialysis, renally adjusted therefore cannot increase dose any further -Continue Versed at 40 mg/h and propofol at 60 mcg/kg/min -If patient has any further seizures, start Onfi 10 mg once daily -If no further seizures overnight, will consider weaning of sedation tomorrow. -Discussed with patient's sister at bedside that patient has had prolonged seizures and I am concerned about neurologic injury.  However, will know more once patient sedation is turned off.  Patient sister expressed understanding. -Management of rest of comorbidities per primary team.   CRITICAL CARE Performed by: Lora Havens   Total critical care time: 35 minutes  Critical care time was exclusive of separately billable procedures and treating other patients.  Critical care was necessary to treat or prevent imminent or life-threatening deterioration.  Critical care was time spent personally by me on the following activities: development of treatment plan with patient and/or surrogate as well as nursing, discussions with consultants, evaluation of patient's response to treatment, examination of patient, obtaining history from patient or surrogate, ordering and performing treatments and interventions, ordering and review of laboratory studies, ordering and review of radiographic studies, pulse oximetry and re-evaluation of patient's condition.   Zeb Comfort Epilepsy [facility] For questions after 5pm please refer to AMION to reach the Neurologist on call

## 2020-04-30 NOTE — Progress Notes (Cosign Needed Addendum)
NOT FOR USE IN MEDICAL RECORD. EDUCATIONAL USE ONLY.  NAME:  Jared [name], Jared [name] MRN:  [id number], DOB:  07/25/1956, LOS: 3 ADMISSION DATE:  05/19/2020, CONSULTATION DATE:  05/14/2020 REFERRING MD:  Dr. Tyrone Nine CHIEF COMPLAINT:  Unresponsiveness   Brief History   64 year old male with PMH significant for ESRD on HD presented to ED for unresponsiveness from home. He has missed 2 dialysis sessions. He was intubated upon arrival. Initial work-up notable for hyperkalemia and volume overload. He was transferred to the ICU for emergent HD. His encephalopathic state persisted and he was found to have sub-clinical seizures on 04/29/20.  History of present illness   Jared [name] is a 64 year old male with a PMH significant for ESRD on HD Tuesday, Thursday, and Saturday, DM, HTN, seizures, and stroke. He last received HD on 5/29 and afterward celebrated his birthday with his family. His family reported concerns that he did not adhere to his renal diet or fluid restrictions and had since been complaining of nausea, vomiting, and diarrhea. Due to these symptoms, he missed his HD session on 6/1 and was found unresponsive later than evening. Upon arrival, EMS treated empirically for hyperkalemia with IV calcium, HCO3, and lidocaine and he was transported to the ED. He was unresponsive upon arrival to the ED and was intubated for airway protection. Admission workup notable for hyperkalemia, hyperglycemia, and hypertension with SBP's in the 220's. Nephrology was consulted and he was admitted to the ICU for emergent HD. His encephalopathic state persisted and he was found to be in status epilepticus on 04/29/20.   Past Medical History  ESRD - HD Tuesday, Thursday, Saturday GERD CVA Prostate cancer DM II HTN Seizures Hyperparathyroidism  Significant Hospital Events   6/1 Admitted to ICU for emergent HD 6/2 Emergent HD 6/3 Febrile overnight; started on broad spectrum antibiotics. HD today. Seizures reported on  EEG 6/4 LTM EEG overnight without complete burst suppression pattern. Sedatives increased by neurology.  Consults:  6/1 Nephrology 6/3 Neurology  Procedures:  6/1 ETT >> 6/3 Right femoral CVC >>  Significant Diagnostic Tests:  6/2 CT Head >> Chronic microvascular changes. No intracranial abnormalities 6/2 MRI Head >> Age-related cerebral atrophy otherwise no acute abnormalities. No findings to suggest PRES. 6/3 CXR >> Lower lung volumes but improved pulmonary opacities. Endotracheal tube remains below clavicular heads. Gastric tube terminates below diaphragm.  Micro Data:  6/1 MRSA PCR >> Negative 6/1 SARS CoV2 >> Negative 6/3 Blood cultures >> NGTD 6/3 Respiratory culture >> NGTD  Antimicrobials:  6/3 Vancomycin >> 6/3 Cefepime >>  Interim history/subjective:  He has a history of seizures and takes Levetiracetam at home. Underwent CT and MRI of the head which showed no intracranial abnormalities. Spot EEG showed generalized and bilateral independent left and right hemisphere epileptogenicity.  LTM EEG overnight with incomplete burst suppression and sedation was increased per neurology. He was started on phenylephrine for blood pressure support yesterday.   Objective   Blood pressure 111/63, pulse 88, temperature 98.5 F (36.9 C), temperature source Axillary, resp. rate 18, height 5\' 8"  (1.727 m), weight 78.2 kg, SpO2 99 %.    Vent Mode: PRVC FiO2 (%):  [40 %] 40 % Set Rate:  [14 bmp-18 bmp] 14 bmp Vt Set:  [550 mL] 550 mL PEEP:  [8 cmH20] 8 cmH20 Plateau Pressure:  [16 cmH20-23 cmH20] 20 cmH20   Intake/Output Summary (Last 24 hours) at 04/30/2020 0728 Last data filed at 04/30/2020 0700 Gross per 24 hour  Intake 4049.98 ml  Output 3000 ml  Net 1049.98 ml   Filed Weights   04/29/20 0730 04/29/20 1146 04/30/20 0500  Weight: 77 kg 74.4 kg 78.2 kg    Examination: General: Elderly male laying in bed in no obvious distress and on MV.  HENT: Non-icteric sclera. Some  scleral edema bilaterally. No corneal reflexes. ETT and OGT in place. MMM Lungs: Lung sounds clear to auscultation throughout bilaterally. No accessory muscle use. Synchronous with ventilator. Cardiovascular: Normal S1/S2. No murmurs or rubs. No peripheral edema Abdomen: Non-distended, bowel sounds present. Soft.  Extremities: No evidence of joint deformity. Normal muscle tone and bulk. Calves soft and compressible without erythema or edema.  Neuro: Heavily sedated on propofol and midazolam drips. LTM EEG currently running. Pupils 83mm and equal, non-reactive to light. Does not follow commands or withdraw to painful stimuli.  Resolved Hospital Problem list   Nausea and vomiting  Assessment & Plan:  Acute metabolic encephalopathy, likely secondary to sub-clinical status epilepticus EEG performed on 6/2 showed multiple seizures. History of seizures and on keppra at home. CT Head and MRI without acute intracranial abnormalities. Metabolic abnormalities have been corrected (acidosis, hypoxia, uremia)  Plan: -Neurology consulted for assistance in management. -Continuous EEG  -Propofol and midazolam drips for burst suppression -Continue Keppra 1g q24h with additional keppra 500mg  on HD days. -Continue valproic acid 400mg  TID -Added Vimpat per neuro -Minimize stimulation   Acute hypoxic respiratory failure  Improving gas exchange. Likely secondary to pulmonary edema from volume overload due to missing HD. Afebrile.  Plan: -Lung-protective ventilation strategy -TV ~29mL/kg, PEEP 8. Keep SpO2 > 92% -Keep intubated  Hypotension Likely a combination of HD fluid removal and sedation-induced. Currently on propofol 33mcg and midazolam 40mg  drips. Received albumin and 1L LR yesterday and was started on phenylephrine  Plan: -Maintain MAP > 65 -Continue phenylephrine drip  -Appears euvolemic  Hypertensive Emergency Improving after HD. Likely secondary to volume overload due to missed HD in the  setting of ESRD. Takes amlodipine, clonidine, metoprolol, and Imdur at home.  Plan: -Goal SBP 110-140. -Discontinued anti-hypertensives in setting of sedation-induced hypotension for burst suppression   ESRD on HD - TTS Hyperkalemia secondary to ESRD Improvement in renal indices and in potassium, K now 3.4 today following HD yesterday.  Plan: -Continue HD schedule per nephrology recs -Avoid nephrotoxins -Heparin for DVT/PE prophylaxis -Continue to trend renal indices and K - obtain BMP now -Continuous cardiac monitoring   Thrombocytopenia Likely secondary to HD.   Plan: -Continue to monitor for evidence of bleeding -Trend platelet count daily  Fever Improving. Without leukocytosis. Received APAP overnight.  Plan: -Blood and respiratory cultures NGTD -C-diff stool sample negative -Stable CXR. Continue vanc/cefe    DM, poorly controlled A1c 9.6 on insulin therapy at home. Persistently high CBG despite starting SSI + basal yesterday. Hypoglycemic episode overnight.   Plan: -Would decrease basal to 10 units daily  -Remove correction insulin -Continue SSI -BG q4h   Best practice:  Diet: TF per nutrition Pain/Anxiety/Delirium protocol (if indicated): Propofol, midazolam, and fentanyl PRN VAP protocol (if indicated): Yes DVT prophylaxis: Heparin SQ GI prophylaxis: Protonix  Glucose control: SSI and basal Mobility: Bedrest Code Status: Full Family Communication: Pending Disposition: ICU  Labs   CBC: Recent Labs  Lab 05/24/2020 2024 05/11/2020 2124 04/28/20 0130 04/28/20 0500 04/28/20 0507 04/29/20 0153 04/30/20 0434  WBC 5.3  --  4.7 3.9*  --  7.4 4.9  NEUTROABS 4.3  --   --   --   --   --   --  HGB 14.3   < > 13.5 13.3 13.9 10.6* 9.4*  HCT 45.1   < > 41.2 40.8 41.0 34.1* 30.3*  MCV 86.2  --  84.3 84.6  --  88.6 89.6  PLT 142*  --  166 176  --  103* 93*   < > = values in this interval not displayed.    Basic Metabolic Panel: Recent Labs  Lab  04/28/20 1243 04/28/20 1835 04/28/20 2006 04/29/20 0825 04/29/20 1819  NA 134* 135 136 136 139  K 6.1* 5.4* 5.9* 4.3 3.4*  CL 93* 94* 93* 94* 95*  CO2 23 24 23 22 26   GLUCOSE 330* 280* 289* 226* 80  BUN 61* 68* 79* 86* 35*  CREATININE 8.51* 8.97* 9.39* 9.45* 5.24*  CALCIUM 9.0 8.9 8.8* 8.7* 9.0  MG  --  2.0  --   --   --   PHOS  --   --  5.1* 6.8* 4.6   GFR: Estimated Creatinine Clearance: 13.4 mL/min (A) (by C-G formula based on SCr of 5.24 mg/dL (H)). Recent Labs  Lab 04/28/20 0130 04/28/20 0500 04/29/20 0153 04/30/20 0434  WBC 4.7 3.9* 7.4 4.9    Liver Function Tests: Recent Labs  Lab 05/15/2020 2024 04/28/20 2006 04/29/20 0825 04/29/20 1303 04/29/20 1819  AST 30  --   --  49*  --   ALT 20  --   --  31  --   ALKPHOS 94  --   --  69  --   BILITOT 1.3*  --   --  2.0*  --   PROT 7.3  --   --  7.9  --   ALBUMIN 3.9 3.0* 2.9* 3.3* 2.7*   No results for input(s): LIPASE, AMYLASE in the last 168 hours. Recent Labs  Lab 04/29/20 1303  AMMONIA 41*    ABG    Component Value Date/Time   PHART 7.489 (H) 04/28/2020 0507   PCO2ART 39.0 04/28/2020 0507   PO2ART 223 (H) 04/28/2020 0507   HCO3 29.8 (H) 04/28/2020 0507   TCO2 31 04/28/2020 0507   ACIDBASEDEF 5.0 (H) 05/20/2020 2124   O2SAT 100.0 04/28/2020 0507     Coagulation Profile: No results for input(s): INR, PROTIME in the last 168 hours.  Cardiac Enzymes: No results for input(s): CKTOTAL, CKMB, CKMBINDEX, TROPONINI in the last 168 hours.  HbA1C: Hemoglobin A1C  Date/Time Value Ref Range Status  10/30/2018 04:39 PM 7.2 (A) 4.0 - 5.6 % Final   Hgb A1c MFr Bld  Date/Time Value Ref Range Status  04/28/2020 01:30 AM 9.6 (H) 4.8 - 5.6 % Final    Comment:    (NOTE) Pre diabetes:          5.7%-6.4% Diabetes:              >6.4% Glycemic control for   <7.0% adults with diabetes   02/13/2018 02:08 PM 6.3 (H) 4.8 - 5.6 % Final    Comment:    (NOTE) Pre diabetes:          5.7%-6.4% Diabetes:               >6.4% Glycemic control for   <7.0% adults with diabetes     CBG: Recent Labs  Lab 04/29/20 1911 04/29/20 2008 04/29/20 2310 04/30/20 0314 04/30/20 0726  GLUCAP 57* 83 78 125* 127*    Review of Systems:   Unable to obtain secondary to intubation and sedation  Past Medical History  He,  has a past  medical history of Anemia, Cancer ([place]), Chronic kidney disease, Diabetes mellitus without complication ([place]), GERD (gastroesophageal reflux disease), Hypertension, Seizures ([place]), Stroke ([place]), and Thyroid disease.   Surgical History    Past Surgical History:  Procedure Laterality Date  . AV FISTULA PLACEMENT Left 06/04/2014   Procedure: ARTERIOVENOUS (AV) FISTULA CREATION-  BRACHIOCEPHALIC WITH LIGATION OF COMPETEING BRANCH;  Surgeon: Mal Misty, MD;  Location: [facility];  Service: Vascular;  Laterality: Left;  . INGUINAL HERNIA REPAIR Left 02/19/2018   Procedure: LAPAROSCOPIC LEFT  INGUINAL HERNIA;  Surgeon: Ralene Ok, MD;  Location: [facility];  Service: General;  Laterality: Left;  . INSERTION OF DIALYSIS CATHETER  04/2014  . INSERTION OF MESH Left 02/19/2018   Procedure: INSERTION OF MESH;  Surgeon: Ralene Ok, MD;  Location: [facility];  Service: General;  Laterality: Left;  . IR GENERIC HISTORICAL Left 11/30/2016   IR THROMBECTOMY AV FISTULA W/THROMBOLYSIS/PTA/STENT INC/SHUNT/IMG LT 11/30/2016 Markus Daft, MD [facility]  . IR GENERIC HISTORICAL  11/30/2016   IR US GUIDE VASC ACCESS LEFT 11/30/2016 Markus Daft, MD [facility]  . IR PARACENTESIS  08/21/2018     Social History   reports that he quit smoking about 35 years ago. He has never used smokeless tobacco. He reports current alcohol use of about 1.0 standard drinks of alcohol per week. He reports that he does not use drugs.   Family History   His family history includes Cancer - Lung in his mother; Cancer - Other in his father. There is no history of Diabetes.   Allergies Allergies  Allergen Reactions  . Penicillins Other  (See Comments)    Tolerates cephalosporins  UNSPECIFIED REACTION  Has patient had a PCN reaction causing immediate rash, facial/tongue/throat swelling, SOB or lightheadedness with hypotension: Unk Has patient had a PCN reaction causing severe rash involving mucus membranes or skin necrosis: Unk Has patient had a PCN reaction that required hospitalization: Unk Has patient had a PCN reaction occurring within the last 10 years: Unk If all of the above answers are "NO", then may proceed with Cephalosporin use.      Home Medications  Prior to Admission medications   Medication Sig Start Date End Date Taking? Authorizing Provider  amLODipine (NORVASC) 10 MG tablet Take 1 tablet (10 mg total) by mouth daily. 12/02/17   Sheikh, Georgina Quint Latif, DO  atorvastatin (LIPITOR) 20 MG tablet TAKE 1 TABLET BY MOUTH ONCE DAILY 09/06/18   Henson, Vickie L, NP-C  b complex-vitamin c-folic acid (NEPHRO-VITE) 0.8 MG TABS tablet TAKE 1 TABLET BY MOUTH EVERY DAY (ON DIALYSIS DAYS, TAKE AFTER DIALYSIS TREATMENT) 09/25/18   [provider]  cinacalcet (SENSIPAR) 30 MG tablet Take 30 mg by mouth daily. 04/25/18   [provider]  cloNIDine (CATAPRES) 0.3 MG tablet Take 0.3 mg by mouth 2 (two) times daily. as directed 04/24/18   [provider]  hydrALAZINE (APRESOLINE) 100 MG tablet Take 1 tablet (100 mg total) by mouth 3 (three) times daily. 03/27/17   Angiulli, Lavon Paganini, PA-C  insulin NPH Human (NOVOLIN N) 100 UNIT/ML injection Inject 20 units every morning Patient taking differently: Inject 30 Units into the skin daily before breakfast.  04/03/17   Renato Shin, MD  isosorbide mononitrate (IMDUR) 30 MG 24 hr tablet Take 1 tablet by mouth once daily 04/04/19   Raenette Rover, Vickie L, NP-C  levETIRAcetam (KEPPRA) 250 MG tablet Take 1 tablet by mouth twice daily 09/03/19   Frann Rider, NP  metoprolol tartrate (LOPRESSOR) 100 MG  tablet Take 1 tablet (100 mg total) by mouth 2 (two) times daily. 05/21/17    Henson, Vickie L, NP-C  ondansetron (ZOFRAN ODT) 4 MG disintegrating tablet Take 1 tablet (4 mg total) by mouth every 8 (eight) hours as needed for nausea or vomiting. 10/23/18   Virgel Manifold, MD  pantoprazole (PROTONIX) 40 MG tablet TAKE 1 TABLET BY MOUTH ONCE DAILY AT 6AM Patient taking differently: TAKE 1 TABLET BY MOUTH twice DAILy 02/01/18   Henson, Vickie L, NP-C  promethazine-dextromethorphan (PROMETHAZINE-DM) 6.25-15 MG/5ML syrup TAKE 5 MLS BY MOUTH 4 TIMES DAILY AS NEEDED 10/21/18   [provider]  sevelamer carbonate (RENVELA) 800 MG tablet Take 3 tablets (2,400 mg total) by mouth 3 (three) times daily with meals. 03/27/17   Angiulli, Lavon Paganini, PA-C     Critical care time: 50 minutes    Electronically signed by: Mamie Nick. Emogene Morgan Student, 04/30/20 [phone]

## 2020-05-01 ENCOUNTER — Inpatient Hospital Stay (HOSPITAL_COMMUNITY): Payer: Medicare Other

## 2020-05-01 LAB — CBC
HCT: 30.1 % — ABNORMAL LOW (ref 39.0–52.0)
Hemoglobin: 9.2 g/dL — ABNORMAL LOW (ref 13.0–17.0)
MCH: 27.5 pg (ref 26.0–34.0)
MCHC: 30.6 g/dL (ref 30.0–36.0)
MCV: 89.9 fL (ref 80.0–100.0)
Platelets: 90 10*3/uL — ABNORMAL LOW (ref 150–400)
RBC: 3.35 MIL/uL — ABNORMAL LOW (ref 4.22–5.81)
RDW: 17.5 % — ABNORMAL HIGH (ref 11.5–15.5)
WBC: 4.2 10*3/uL (ref 4.0–10.5)
nRBC: 0.5 % — ABNORMAL HIGH (ref 0.0–0.2)

## 2020-05-01 LAB — GLUCOSE, CAPILLARY
Glucose-Capillary: 139 mg/dL — ABNORMAL HIGH (ref 70–99)
Glucose-Capillary: 149 mg/dL — ABNORMAL HIGH (ref 70–99)
Glucose-Capillary: 155 mg/dL — ABNORMAL HIGH (ref 70–99)
Glucose-Capillary: 184 mg/dL — ABNORMAL HIGH (ref 70–99)
Glucose-Capillary: 186 mg/dL — ABNORMAL HIGH (ref 70–99)
Glucose-Capillary: 211 mg/dL — ABNORMAL HIGH (ref 70–99)
Glucose-Capillary: 262 mg/dL — ABNORMAL HIGH (ref 70–99)

## 2020-05-01 LAB — RENAL FUNCTION PANEL
Albumin: 2.5 g/dL — ABNORMAL LOW (ref 3.5–5.0)
Albumin: 2.4 g/dL — ABNORMAL LOW (ref 3.5–5.0)
Anion gap: 17 — ABNORMAL HIGH (ref 5–15)
Anion gap: 12 (ref 5–15)
BUN: 71 mg/dL — ABNORMAL HIGH (ref 8–23)
BUN: 32 mg/dL — ABNORMAL HIGH (ref 8–23)
CO2: 24 mmol/L (ref 22–32)
CO2: 27 mmol/L (ref 22–32)
Calcium: 8.8 mg/dL — ABNORMAL LOW (ref 8.9–10.3)
Calcium: 8.9 mg/dL (ref 8.9–10.3)
Chloride: 95 mmol/L — ABNORMAL LOW (ref 98–111)
Chloride: 97 mmol/L — ABNORMAL LOW (ref 98–111)
Creatinine, Ser: 6.29 mg/dL — ABNORMAL HIGH (ref 0.61–1.24)
Creatinine, Ser: 3.42 mg/dL — ABNORMAL HIGH (ref 0.61–1.24)
GFR calc Af Amer: 10 mL/min — ABNORMAL LOW (ref >60)
GFR calc Af Amer: 20 mL/min — ABNORMAL LOW (ref >60)
GFR calc non Af Amer: 8 mL/min — ABNORMAL LOW (ref >60)
GFR calc non Af Amer: 18 mL/min — ABNORMAL LOW (ref >60)
Glucose, Bld: 191 mg/dL — ABNORMAL HIGH (ref 70–99)
Glucose, Bld: 177 mg/dL — ABNORMAL HIGH (ref 70–99)
Phosphorus: 5.3 mg/dL — ABNORMAL HIGH (ref 2.5–4.6)
Phosphorus: 4.2 mg/dL (ref 2.5–4.6)
Potassium: 4.8 mmol/L (ref 3.5–5.1)
Potassium: 4.1 mmol/L (ref 3.5–5.1)
Sodium: 136 mmol/L (ref 135–145)
Sodium: 136 mmol/L (ref 135–145)

## 2020-05-01 LAB — COMPREHENSIVE METABOLIC PANEL
ALT: 160 U/L — ABNORMAL HIGH (ref 0–44)
AST: 240 U/L — ABNORMAL HIGH (ref 15–41)
Albumin: 2.5 g/dL — ABNORMAL LOW (ref 3.5–5.0)
Alkaline Phosphatase: 124 U/L (ref 38–126)
Anion gap: 17 — ABNORMAL HIGH (ref 5–15)
BUN: 68 mg/dL — ABNORMAL HIGH (ref 8–23)
CO2: 25 mmol/L (ref 22–32)
Calcium: 8.8 mg/dL — ABNORMAL LOW (ref 8.9–10.3)
Chloride: 93 mmol/L — ABNORMAL LOW (ref 98–111)
Creatinine, Ser: 6.23 mg/dL — ABNORMAL HIGH (ref 0.61–1.24)
GFR calc Af Amer: 10 mL/min — ABNORMAL LOW (ref >60)
GFR calc non Af Amer: 9 mL/min — ABNORMAL LOW (ref >60)
Glucose, Bld: 253 mg/dL — ABNORMAL HIGH (ref 70–99)
Potassium: 4.5 mmol/L (ref 3.5–5.1)
Sodium: 135 mmol/L (ref 135–145)
Total Bilirubin: 0.6 mg/dL (ref 0.3–1.2)
Total Protein: 6.3 g/dL — ABNORMAL LOW (ref 6.5–8.1)

## 2020-05-01 LAB — CULTURE, RESPIRATORY W GRAM STAIN
Culture: NORMAL
Special Requests: NORMAL

## 2020-05-01 LAB — TRIGLYCERIDES: Triglycerides: 171 mg/dL — ABNORMAL HIGH (ref <150)

## 2020-05-01 LAB — AMMONIA: Ammonia: 25 umol/L (ref 9–35)

## 2020-05-01 LAB — MAGNESIUM: Magnesium: 2 mg/dL (ref 1.7–2.4)

## 2020-05-01 MED ORDER — HYDRALAZINE HCL 20 MG/ML IJ SOLN
10.0000 mg | Freq: Once | INTRAMUSCULAR | Status: AC
  Administered 2020-05-01: 10 mg via INTRAVENOUS

## 2020-05-01 MED ORDER — CLOBAZAM 2.5 MG/ML PO SUSP
5.0000 mg | Freq: Every day | ORAL | Status: DC
  Administered 2020-05-02 – 2020-05-07 (×6): 5 mg
  Filled 2020-05-01 (×8): qty 4

## 2020-05-01 MED ORDER — CLOBAZAM 2.5 MG/ML PO SUSP
5.0000 mg | Freq: Two times a day (BID) | ORAL | Status: DC
  Administered 2020-05-01: 5 mg
  Filled 2020-05-01: qty 4

## 2020-05-01 MED ORDER — HEPARIN SODIUM (PORCINE) 1000 UNIT/ML IJ SOLN
INTRAMUSCULAR | Status: AC
  Administered 2020-05-01: 2300 [IU]
  Filled 2020-05-01: qty 3

## 2020-05-01 MED ORDER — INSULIN DETEMIR 100 UNIT/ML ~~LOC~~ SOLN
20.0000 [IU] | Freq: Two times a day (BID) | SUBCUTANEOUS | Status: DC
  Administered 2020-05-01 – 2020-05-02 (×4): 20 [IU] via SUBCUTANEOUS
  Filled 2020-05-01 (×6): qty 0.2

## 2020-05-01 MED ORDER — PHENYLEPHRINE CONCENTRATED 100MG/250ML (0.4 MG/ML) INFUSION SIMPLE
0.0000 ug/min | INTRAVENOUS | Status: DC
  Filled 2020-05-01: qty 250

## 2020-05-01 MED ORDER — CHLORHEXIDINE GLUCONATE CLOTH 2 % EX PADS
6.0000 | MEDICATED_PAD | Freq: Every day | CUTANEOUS | Status: DC
  Administered 2020-05-02: 6 via TOPICAL

## 2020-05-01 NOTE — Plan of Care (Signed)
I was asked by Dr. [name] to review EEG with Dr. Hortense Ramal after propofol was weaned down. Currently not on propofol-only on Versed drip. EEG reviewed with Dr. Tillman Abide stable.  No need to restart propofol. Continue with Versed drip Rest of the plan per Dr. [name] Neurology will follow with you.  -- Amie Portland, MD [facility] Pager: [phone] If 7pm to 7am, please call on call as listed on AMION.

## 2020-05-01 NOTE — Progress Notes (Signed)
NAME:  Jared [name], Jared [name] MRN:  [id number], DOB:  Jun 01, 1956, LOS: 4 ADMISSION DATE:  05/18/2020, CONSULTATION DATE: 6/1 REFERRING MD: Dr. Tyrone Nine EDP, CHIEF COMPLAINT: Unresponsiveness  Brief History   64 year old male with ESRD on HD unresponsive from home.  Intubated in ED.  Has missed 2 dialysis sessions.  Hyperkalemic and volume overloaded in the ED.  Admitted to ICU for emergent hemodialysis.  Past Medical History   has a past medical history of Anemia, Cancer ([place]), Chronic kidney disease, Diabetes mellitus without complication ([place]), GERD (gastroesophageal reflux disease), Hypertension, Seizures ([place]), Stroke ([place]), and Thyroid disease.  Significant Hospital Events   6/1 admit for emergent dialysis. Intubated 6/2 Seizures 6/4 remains in status epilepticus, shock due to heavy sedation.   Consults:  Nephrology Neurology  Procedures:  ETT 6/1> R Fem CVC 6/3>  Significant Diagnostic Tests:  CT head 6/1 > chronic microvascular ischemic changes, volume loss. No acute intracranial abnormalities CXR 6/2> improved pulmonary edema  MRI Brain 6/2> no acute intracranial abnormality, no findings suggestive of PRES.  CXR 6/3> lower lung volumes. 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 Bowel loops visible.   LTM> continues to demonstrate seizure activity, averaging 6-8/hr from L and R hemisphere   Micro Data:  6/1 SARS Cov2 > neg  6/1 MRSA> neg 6/3 tracheal aspirate> few GPC.  6/3 BCx> no growth < 12 hrs  6/3 CDiff>>neg  Antimicrobials:   6/3 vanc 6/3 cefepime (dc due to ongoing seizures)  6/4 rocephin and flagyl >> (PCN allergy jwith unknown reaction, cannot use unasyn. tx for possible PNA/aspiration PNA)   Interim history/subjective:  No events remains on extremely high doses of sedation to attempt to suppress seizures.  Objective   Blood pressure 116/62, pulse 90, temperature 98.8 F (37.1 C), temperature source Oral, resp. rate 18, height 5\' 8"  (1.727 m),  weight 81.1 kg, SpO2 99 %.    Vent Mode: PRVC FiO2 (%):  [40 %] 40 % Set Rate:  [18 bmp] 18 bmp Vt Set:  [550 mL] 550 mL PEEP:  [8 cmH20] 8 cmH20 Plateau Pressure:  [19 cmH20-20 cmH20] 20 cmH20   Intake/Output Summary (Last 24 hours) at 05/01/2020 5956 Last data filed at 05/01/2020 0600 Gross per 24 hour  Intake 3538.31 ml  Output --  Net 3538.31 ml   Filed Weights   04/29/20 1146 04/30/20 0500 05/01/20 0415  Weight: 74.4 kg 78.2 kg 81.1 kg    Examination: GEN: Sedated ill-appearing man on vent HEENT: Endotracheal tube in place with scant mucoid secretions CV: Heart sounds are regular, extremities are warm PULM: There are rhonchi bilaterally, he is passive on ventilator GI: Abdomen is soft, there is positive bowel sounds, there is loose stool in rectal bag EXT: He has mild diffuse anasarca, he has a left upper extremity fistula NEURO: He is profoundly sedated, his pupils are pinpoint, there is no response to pain PSYCH: Cannot assess SKIN: No rashes  Chemistries noted, K okay, to get HD today, CBC shows stable pancytopenia, blood sugars are elevated  Central vascular congestion and bilateral airspace disease on chest x-ray  Resolved Hospital Problem list     Assessment & Plan:   Status epilepticus-MRI without evidence of PRES.  Has hx of seizures in past.   VEEG monitoring ongoing. - AEDs and sedation titration per neurology, appreciate help  Acute hypoxic respiratory failure- due to inability to handle secretions, aspiration, volume overload - fluid removal by HD - cultures unremarkable to date, fevers resolved -  VAP prevention bundle  Aspiration pneumonitis vs. Pneumonia- with fevers, leukocytosis, MRSA swab neg, tracheal aspirate neg - Short course of ceftriaxone reasonable  ESRD on HD  - Continue iHD, negative as possible from my standpoint  Hypotension related to sedation - pressors PRN to maintain MAP 65  DM, poorly controlled -A1c 9.6 - Increase levemir,  monitor SSI requirements  Pancytopenia- normocytic, stable, question if related to cancer vs. meds  Hx prostate cancer-followed by [facility] , do not have nubeqa on formulary  Loose Stools-present on admission, C diff neg -BMS  Best practice:  Diet: TF Pain/Anxiety/Delirium protocol (if indicated): per neurology VAP protocol (if indicated): yes DVT prophylaxis: SQH GI prophylaxis: PPI Glucose control: SSI  + levemir  Mobility: BR Code Status: FULL  Family Communication: will reach out if they come to bedside Disposition: ICU    The patient is critically ill with multiple organ systems failure and requires high complexity decision making for assessment and support, frequent evaluation and titration of therapies, application of advanced monitoring technologies and extensive interpretation of multiple databases. Critical Care Time devoted to patient care services described in this note independent of APP/resident time (if applicable)  is 45 minutes.   Erskine Emery MD [facility] 05/01/2020 7:42 AM Personal pager: [phone] If unanswered, please page CCM On-call: [phone]

## 2020-05-01 NOTE — Procedures (Addendum)
Patient Name:Jared [name]. LJQ:[id number] Epilepsy Attending:Jovonna Nickell Barbra Sarks Referring Physician/Provider:Grace Bowser, NP Duration:04/30/2020 1113 to 05/01/2020 1113  Patient history:64 year old male with history of seizures on Keppra was noted to have further seizure-like activity. EEG evaluate for seizures.  Level of alertness:comatose  AEDs during EEG study:Keppra, VPA, vimpat, versed, propofol  Technical aspects: This EEG study was done with scalp electrodes positioned according to the 10-20 International system of electrode placement. Electrical activity was acquired at a sampling rate of 500Hz  and reviewed with a high frequency filter of 70Hz  and a low frequency filter of 1Hz . EEG data were recorded continuously and digitally stored.   Description: EEG showed discontinuous pattern with 3-10 seconds of generalized sharply contoured polymorphic 3-6hz  theta-delta slowing alternating with 3-5 seconds of background suppression. Left and right hemispheric independent spikes were also noted .Hyperventilation and photic stimulation were not performed.   ABNORMALITY - Spikes, left and right hemisphere - Intermittent slow, generalized - Background suppression, generalized  IMPRESSION: This studyshowed evidence of independent left and right hemisphere epileptigenicity as well as suggestive of severe diffuse encephalopathy, nonspecific etiology. No definite seizures were seen during this study.   EEG appears to be improving compared to previous day.   Grainne Knights Barbra Sarks

## 2020-05-01 NOTE — Progress Notes (Signed)
LTM maint complete - no skin breakdown under: fp1 f3 f7 a1

## 2020-05-01 NOTE — Plan of Care (Signed)

## 2020-05-01 NOTE — Progress Notes (Signed)
[facility] Progress Note  Subjective: pt seen in iCU, 3 L off w/ HD this am, BP's relatively stable/ normal to soft on HD. Pt on vent and heavily sedated for refractory seizures  Vitals:   05/01/20 1500 05/01/20 1503 05/01/20 1550 05/01/20 1600  BP: 138/69   (!) 160/82  Pulse: 84 82 84 86  Resp: '18  18 18  '$ Temp:   98.3 F (36.8 C)   TempSrc:   Axillary   SpO2: 99% 100% 100% 100%  Weight:      Height:        Exam:   On vent and sedated  no jvd  Chest cta bilat  Cor reg no RG  Abd soft ntnd no ascites   Ext +2 UE> LE edema   Sedated , not responsive    LUA aVF+bruit    OP HD: TTS [facility]  4h  400/800  2/2 bath 72.5kg  Hep 2300   - hect 7 ug   CXR 6/3 today - improved pulm infiltrates/ opacities  Assessment/ Plan: 1. Seizures - non-convulsive seizures per EEG. Started on propofol and Versed. Per neuro/ pmd. MRI negative, no evidence of PRES.  2. Fever/ pulm infiltrates - possible PNA improving. Getting IV abx 3. AMS: admit creat 13.12, poss some element of uremia on admit. Creat down now w/ HD. 4. Acute resp failure/ vol overload: CXR worse, suspect vol overload from all the IV meds and drips. Any IV meds that can be dc'd or switched to po would be appreciated.  5. ESRD - on HD TTS. HD today max UF, plan HD again tomorrow, try to get volume under control 6. Secondary hyperPTH: cont outpt hectorol dose   Rob Haywood Meinders 05/01/2020, 4:08 PM   Recent Labs  Lab 04/30/20 0434 04/30/20 0800 04/30/20 1758 04/30/20 1758 05/01/20 0308 05/01/20 0837  K  --    < > 4.1   < > 4.5 4.8  BUN  --    < > 57*   < > 68* 71*  CREATININE  --    < > 6.13*   < > 6.23* 6.29*  CALCIUM  --    < > 8.7*   < > 8.8* 8.8*  PHOS  --    < > 5.3*  --   --  5.3*  HGB 9.4*  --   --   --  9.2*  --    < > = values in this interval not displayed.   Inpatient medications: . chlorhexidine gluconate (MEDLINE KIT)  15 mL Mouth Rinse BID  . Chlorhexidine Gluconate Cloth  6 each Topical Q0600   . Chlorhexidine Gluconate Cloth  6 each Topical Q0600  . [START ON 05/02/2020] cloBAZam  5 mg Per Tube Daily  . docusate  100 mg Per Tube BID  . feeding supplement (PRO-STAT SUGAR FREE 64)  30 mL Per Tube TID  . heparin  5,000 Units Subcutaneous Q8H  . insulin aspart  0-15 Units Subcutaneous Q4H  . insulin detemir  20 Units Subcutaneous BID  . mouth rinse  15 mL Mouth Rinse 10 times per day  . pantoprazole sodium  40 mg Per Tube QHS  . valproic acid  400 mg Per Tube TID   . sodium chloride Stopped (04/30/20 1223)  . cefTRIAXone (ROCEPHIN)  IV Stopped (05/01/20 1132)  . feeding supplement (VITAL 1.5 CAL) 1,000 mL (05/01/20 1350)  . lacosamide (VIMPAT) IV Stopped (05/01/20 1038)  . levETIRAcetam Stopped (04/30/20 2257)  . levETIRAcetam  Stopped (05/01/20 1211)  . midazolam 50 mg/hr (05/01/20 1500)  . phenylephrine (NEO-SYNEPHRINE) Adult infusion     acetaminophen (TYLENOL) oral liquid 160 mg/5 mL, dextrose, docusate sodium, fentaNYL (SUBLIMAZE) injection, fentaNYL (SUBLIMAZE) injection, hydrALAZINE, polyethylene glycol

## 2020-05-01 NOTE — Progress Notes (Signed)
Subjective: Sedated on 60 of propofol and 50 of Versed  Objective: Current vital signs: BP 126/73 (BP Location: Right Arm)   Pulse 89   Temp 98.8 F (37.1 C) (Oral)   Resp 18   Ht '5\' 8"'$  (1.727 m)   Wt 81.1 kg   SpO2 100%   BMI 27.19 kg/m  Vital signs in last 24 hours: Temp:  [96.3 F (35.7 C)-98.8 F (37.1 C)] 98.8 F (37.1 C) (06/05 0745) Pulse Rate:  [67-99] 89 (06/05 1115) Resp:  [18-22] 18 (06/05 1000) BP: (107-137)/(60-75) 126/73 (06/05 1115) SpO2:  [98 %-100 %] 100 % (06/05 1107) FiO2 (%):  [40 %] 40 % (06/05 1107) Weight:  [81.1 kg] 81.1 kg (06/05 0415)  Intake/Output from previous day: 06/04 0701 - 06/05 0700 In: 3663.3 [I.V.:2298.4; NG/GT:800; IV Piggyback:564.9] Out: -  Intake/Output this shift: Total I/O In: 470.5 [I.V.:227.5; NG/GT:243] Out: -  Nutritional status:  Diet Order            Diet NPO time specified  Diet effective now             General: Lying in bed, on high-dose IV sedation  Lungs: Intubated Extremities: No cyanosis  Neuro: Ment: Note, exam performed while patient sedated on propofol and Versed. No evidence for cortical function in this context.  CN: Pupils are pinpoint. Eyes conjugate. No doll's eye reflex. Corneal reflexes absent, gag and cough reflexes absent. Face flaccidly symmetric.   (sedated on propofol and Versed) Motor/Sensory: Flaccid tone x 4. No movement to any external stimuli. (sedated on propofol and Versed) Reflexes: Hypoactive throughout   Lab Results: Results for orders placed or performed during the hospital encounter of 04/30/2020 (from the past 48 hour(s))  Hepatic function panel     Status: Abnormal   Collection Time: 04/29/20  1:03 PM  Result Value Ref Range   Total Protein 7.9 6.5 - 8.1 g/dL   Albumin 3.3 (L) 3.5 - 5.0 g/dL   AST 49 (H) 15 - 41 U/L   ALT 31 0 - 44 U/L   Alkaline Phosphatase 69 38 - 126 U/L   Total Bilirubin 2.0 (H) 0.3 - 1.2 mg/dL   Bilirubin, Direct 0.1 0.0 - 0.2 mg/dL   Indirect  Bilirubin 1.9 (H) 0.3 - 0.9 mg/dL    Comment: Performed at [facility], [address], [place], [place] [address]  TSH     Status: None   Collection Time: 04/29/20  1:03 PM  Result Value Ref Range   TSH 1.514 0.350 - 4.500 uIU/mL    Comment: Performed by a 3rd Generation assay with a functional sensitivity of <=0.01 uIU/mL. Performed at [facility], [place] [address] [place], [place] [address]   Ammonia     Status: Abnormal   Collection Time: 04/29/20  1:03 PM  Result Value Ref Range   Ammonia 41 (H) 9 - 35 umol/L    Comment: Performed at [facility], [place] [address] [place], [place] [address]  Glucose, capillary     Status: Abnormal   Collection Time: 04/29/20  3:12 PM  Result Value Ref Range   Glucose-Capillary 160 (H) 70 - 99 mg/dL    Comment: Glucose reference range applies only to samples taken after fasting for at least 8 hours.  Renal function panel     Status: Abnormal   Collection Time: 04/29/20  6:19 PM  Result Value Ref Range   Sodium 139 135 - 145 mmol/L   Potassium  3.4 (L) 3.5 - 5.1 mmol/L    Comment: NO VISIBLE HEMOLYSIS   Chloride 95 (L) 98 - 111 mmol/L   CO2 26 22 - 32 mmol/L   Glucose, Bld 80 70 - 99 mg/dL    Comment: Glucose reference range applies only to samples taken after fasting for at least 8 hours.   BUN 35 (H) 8 - 23 mg/dL   Creatinine, Ser 5.24 (H) 0.61 - 1.24 mg/dL    Comment: DELTA CHECK NOTED   Calcium 9.0 8.9 - 10.3 mg/dL   Phosphorus 4.6 2.5 - 4.6 mg/dL   Albumin 2.7 (L) 3.5 - 5.0 g/dL   GFR calc non Af Amer 11 (L) >60 mL/min   GFR calc Af Amer 12 (L) >60 mL/min   Anion gap 18 (H) 5 - 15    Comment: Performed at [facility] [address] [place], [place] [address]  Glucose, capillary     Status: Abnormal   Collection Time: 04/29/20  7:11 PM  Result Value Ref Range   Glucose-Capillary 57 (L) 70 - 99 mg/dL    Comment: Glucose reference range applies only to samples taken after fasting for at least 8 hours.   Glucose, capillary     Status: None   Collection Time: 04/29/20  8:08 PM  Result Value Ref Range   Glucose-Capillary 83 70 - 99 mg/dL    Comment: Glucose reference range applies only to samples taken after fasting for at least 8 hours.  Glucose, capillary     Status: None   Collection Time: 04/29/20 11:10 PM  Result Value Ref Range   Glucose-Capillary 78 70 - 99 mg/dL    Comment: Glucose reference range applies only to samples taken after fasting for at least 8 hours.  Glucose, capillary     Status: Abnormal   Collection Time: 04/30/20  3:14 AM  Result Value Ref Range   Glucose-Capillary 125 (H) 70 - 99 mg/dL    Comment: Glucose reference range applies only to samples taken after fasting for at least 8 hours.  Triglycerides     Status: None   Collection Time: 04/30/20  4:34 AM  Result Value Ref Range   Triglycerides 109 <150 mg/dL    Comment: Performed at [facility] [address] [place], [place] [address]  CBC     Status: Abnormal   Collection Time: 04/30/20  4:34 AM  Result Value Ref Range   WBC 4.9 4.0 - 10.5 K/uL   RBC 3.38 (L) 4.22 - 5.81 MIL/uL   Hemoglobin 9.4 (L) 13.0 - 17.0 g/dL   HCT 30.3 (L) 39.0 - 52.0 %   MCV 89.6 80.0 - 100.0 fL   MCH 27.8 26.0 - 34.0 pg   MCHC 31.0 30.0 - 36.0 g/dL   RDW 17.3 (H) 11.5 - 15.5 %   Platelets 93 (L) 150 - 400 K/uL    Comment: CONSISTENT WITH PREVIOUS RESULT Immature Platelet Fraction may be clinically indicated, consider ordering this additional test [id number] REPEATED TO VERIFY    nRBC 0.0 0.0 - 0.2 %    Comment: Performed at [facility], [place] [address], [place], [place] [address]  Glucose, capillary     Status: Abnormal   Collection Time: 04/30/20  7:26 AM  Result Value Ref Range   Glucose-Capillary 127 (H) 70 - 99 mg/dL    Comment: Glucose reference range applies only to samples taken after fasting for at least 8 hours.  Renal function panel  Status: Abnormal   Collection Time: 04/30/20  8:00 AM   Result Value Ref Range   Sodium 138 135 - 145 mmol/L   Potassium 3.8 3.5 - 5.1 mmol/L   Chloride 95 (L) 98 - 111 mmol/L   CO2 27 22 - 32 mmol/L   Glucose, Bld 126 (H) 70 - 99 mg/dL    Comment: Glucose reference range applies only to samples taken after fasting for at least 8 hours.   BUN 49 (H) 8 - 23 mg/dL   Creatinine, Ser 5.75 (H) 0.61 - 1.24 mg/dL   Calcium 8.8 (L) 8.9 - 10.3 mg/dL   Phosphorus 5.0 (H) 2.5 - 4.6 mg/dL   Albumin 2.7 (L) 3.5 - 5.0 g/dL   GFR calc non Af Amer 9 (L) >60 mL/min   GFR calc Af Amer 11 (L) >60 mL/min   Anion gap 16 (H) 5 - 15    Comment: Performed at [facility] [address], [place], [place] [address]  Glucose, capillary     Status: Abnormal   Collection Time: 04/30/20 11:31 AM  Result Value Ref Range   Glucose-Capillary 117 (H) 70 - 99 mg/dL    Comment: Glucose reference range applies only to samples taken after fasting for at least 8 hours.  Glucose, capillary     Status: Abnormal   Collection Time: 04/30/20  3:17 PM  Result Value Ref Range   Glucose-Capillary 121 (H) 70 - 99 mg/dL    Comment: Glucose reference range applies only to samples taken after fasting for at least 8 hours.  Renal function panel     Status: Abnormal   Collection Time: 04/30/20  5:58 PM  Result Value Ref Range   Sodium 136 135 - 145 mmol/L   Potassium 4.1 3.5 - 5.1 mmol/L   Chloride 95 (L) 98 - 111 mmol/L   CO2 25 22 - 32 mmol/L   Glucose, Bld 185 (H) 70 - 99 mg/dL    Comment: Glucose reference range applies only to samples taken after fasting for at least 8 hours.   BUN 57 (H) 8 - 23 mg/dL   Creatinine, Ser 6.13 (H) 0.61 - 1.24 mg/dL   Calcium 8.7 (L) 8.9 - 10.3 mg/dL   Phosphorus 5.3 (H) 2.5 - 4.6 mg/dL   Albumin 2.5 (L) 3.5 - 5.0 g/dL   GFR calc non Af Amer 9 (L) >60 mL/min   GFR calc Af Amer 10 (L) >60 mL/min   Anion gap 16 (H) 5 - 15    Comment: Performed at [facility] [address], [place], [place] [address]  Glucose, capillary      Status: Abnormal   Collection Time: 04/30/20  8:01 PM  Result Value Ref Range   Glucose-Capillary 202 (H) 70 - 99 mg/dL    Comment: Glucose reference range applies only to samples taken after fasting for at least 8 hours.  Glucose, capillary     Status: Abnormal   Collection Time: 05/01/20 12:07 AM  Result Value Ref Range   Glucose-Capillary 262 (H) 70 - 99 mg/dL    Comment: Glucose reference range applies only to samples taken after fasting for at least 8 hours.  Triglycerides     Status: Abnormal   Collection Time: 05/01/20  3:08 AM  Result Value Ref Range   Triglycerides 171 (H) <150 mg/dL    Comment: Performed at [facility] [address] [place], [place] [address]  Comprehensive metabolic panel  Status: Abnormal   Collection Time: 05/01/20  3:08 AM  Result Value Ref Range   Sodium 135 135 - 145 mmol/L   Potassium 4.5 3.5 - 5.1 mmol/L   Chloride 93 (L) 98 - 111 mmol/L   CO2 25 22 - 32 mmol/L   Glucose, Bld 253 (H) 70 - 99 mg/dL    Comment: Glucose reference range applies only to samples taken after fasting for at least 8 hours.   BUN 68 (H) 8 - 23 mg/dL   Creatinine, Ser 6.23 (H) 0.61 - 1.24 mg/dL   Calcium 8.8 (L) 8.9 - 10.3 mg/dL   Total Protein 6.3 (L) 6.5 - 8.1 g/dL   Albumin 2.5 (L) 3.5 - 5.0 g/dL   AST 240 (H) 15 - 41 U/L   ALT 160 (H) 0 - 44 U/L   Alkaline Phosphatase 124 38 - 126 U/L   Total Bilirubin 0.6 0.3 - 1.2 mg/dL   GFR calc non Af Amer 9 (L) >60 mL/min   GFR calc Af Amer 10 (L) >60 mL/min   Anion gap 17 (H) 5 - 15    Comment: Performed at [facility], [place] [address], [place], [place] [address]  CBC     Status: Abnormal   Collection Time: 05/01/20  3:08 AM  Result Value Ref Range   WBC 4.2 4.0 - 10.5 K/uL   RBC 3.35 (L) 4.22 - 5.81 MIL/uL   Hemoglobin 9.2 (L) 13.0 - 17.0 g/dL   HCT 30.1 (L) 39.0 - 52.0 %   MCV 89.9 80.0 - 100.0 fL   MCH 27.5 26.0 - 34.0 pg   MCHC 30.6 30.0 - 36.0 g/dL   RDW 17.5 (H) 11.5 - 15.5 %   Platelets 90  (L) 150 - 400 K/uL    Comment: CONSISTENT WITH PREVIOUS RESULT Immature Platelet Fraction may be clinically indicated, consider ordering this additional test [id number] REPEATED TO VERIFY    nRBC 0.5 (H) 0.0 - 0.2 %    Comment: Performed at [facility], [place] [address] [place], [place] [address]  Magnesium     Status: None   Collection Time: 05/01/20  3:08 AM  Result Value Ref Range   Magnesium 2.0 1.7 - 2.4 mg/dL    Comment: Performed at [facility] [address], [place], [place] [address]  Ammonia     Status: None   Collection Time: 05/01/20  3:12 AM  Result Value Ref Range   Ammonia 25 9 - 35 umol/L    Comment: Performed at [facility], [place] [address], [place], [place] [address]  Glucose, capillary     Status: Abnormal   Collection Time: 05/01/20  3:20 AM  Result Value Ref Range   Glucose-Capillary 211 (H) 70 - 99 mg/dL    Comment: Glucose reference range applies only to samples taken after fasting for at least 8 hours.  Glucose, capillary     Status: Abnormal   Collection Time: 05/01/20  7:48 AM  Result Value Ref Range   Glucose-Capillary 186 (H) 70 - 99 mg/dL    Comment: Glucose reference range applies only to samples taken after fasting for at least 8 hours.  Renal function panel     Status: Abnormal   Collection Time: 05/01/20  8:37 AM  Result Value Ref Range   Sodium 136 135 - 145 mmol/L   Potassium 4.8 3.5 - 5.1 mmol/L   Chloride 95 (L) 98 - 111 mmol/L   CO2  24 22 - 32 mmol/L   Glucose, Bld 191 (H) 70 - 99 mg/dL    Comment: Glucose reference range applies only to samples taken after fasting for at least 8 hours.   BUN 71 (H) 8 - 23 mg/dL   Creatinine, Ser 6.29 (H) 0.61 - 1.24 mg/dL   Calcium 8.8 (L) 8.9 - 10.3 mg/dL   Phosphorus 5.3 (H) 2.5 - 4.6 mg/dL   Albumin 2.5 (L) 3.5 - 5.0 g/dL   GFR calc non Af Amer 8 (L) >60 mL/min   GFR calc Af Amer 10 (L) >60 mL/min   Anion gap 17 (H) 5 - 15    Comment: Performed at [facility] [address] [place], [place] [address]    Recent Results (from the past 240 hour(s))  SARS Coronavirus 2 by RT PCR (hospital order, performed in [facility] hospital lab) Nasopharyngeal Nasopharyngeal Swab     Status: None   Collection Time: 05/23/2020  8:24 PM   Specimen: Nasopharyngeal Swab  Result Value Ref Range Status   SARS Coronavirus 2 NEGATIVE NEGATIVE Final    Comment: (NOTE) SARS-CoV-2 target nucleic acids are NOT DETECTED. The SARS-CoV-2 RNA is generally detectable in upper and lower respiratory specimens during the acute phase of infection. The lowest concentration of SARS-CoV-2 viral copies this assay can detect is 250 copies / mL. A negative result does not preclude SARS-CoV-2 infection and should not be used as the sole basis for treatment or other patient management decisions.  A negative result may occur with improper specimen collection / handling, submission of specimen other than nasopharyngeal swab, presence of viral mutation(s) within the areas targeted by this assay, and inadequate number of viral copies (<250 copies / mL). A negative result must be combined with clinical observations, patient history, and epidemiological information. Fact Sheet for Patients:   StrictlyIdeas.no Fact Sheet for Healthcare Providers: BankingDealers.co.za This test is not yet approved or cleared  by the [place] FDA and has been authorized for detection and/or diagnosis of SARS-CoV-2 by FDA under an Emergency Use Authorization (EUA).  This EUA will remain in effect (meaning this test can be used) for the duration of the COVID-19 declaration under Section 564(b)(1) of the Act, 21 U.S.C. section 360bbb-3(b)(1), unless the authorization is terminated or revoked sooner. Performed at [facility], [place] [address], [place], [place] [address]   MRSA PCR Screening     Status: None   Collection Time: 05/23/2020 11:16 PM   Specimen: Nasal  Mucosa; Nasopharyngeal  Result Value Ref Range Status   MRSA by PCR NEGATIVE NEGATIVE Final    Comment:        The GeneXpert MRSA Assay (FDA approved for NASAL specimens only), is one component of a comprehensive MRSA colonization surveillance program. It is not intended to diagnose MRSA infection nor to guide or monitor treatment for MRSA infections. Performed at [facility], [place] [address], [place], [place] [address]   Culture, blood (routine x 2)     Status: None (Preliminary result)   Collection Time: 04/29/20  1:43 AM   Specimen: BLOOD  Result Value Ref Range Status   Specimen Description BLOOD RIGHT HAND  Final   Special Requests   Final    BOTTLES DRAWN AEROBIC AND ANAEROBIC Blood Culture adequate volume   Culture   Final    NO GROWTH 2 DAYS Performed at [facility], [place] [address] [place], [place] [address]    Report  Status PENDING  Incomplete  Culture, blood (routine x 2)     Status: None (Preliminary result)   Collection Time: 04/29/20  1:53 AM   Specimen: BLOOD  Result Value Ref Range Status   Specimen Description BLOOD RIGHT HAND  Final   Special Requests   Final    BOTTLES DRAWN AEROBIC ONLY Blood Culture results may not be optimal due to an inadequate volume of blood received in culture bottles   Culture   Final    NO GROWTH 2 DAYS Performed at [facility], [place] [address], [place], [place] [address]    Report Status PENDING  Incomplete  Culture, respiratory (non-expectorated)     Status: None   Collection Time: 04/29/20  2:08 AM   Specimen: Tracheal Aspirate; Respiratory  Result Value Ref Range Status   Specimen Description TRACHEAL ASPIRATE  Final   Special Requests Normal  Final   Gram Stain   Final    RARE WBC PRESENT, PREDOMINANTLY PMN FEW GRAM POSITIVE COCCI    Culture   Final    Consistent with normal respiratory flora. Performed at [facility], [place] [address], [place], [place] [address]    Report Status 05/01/2020  FINAL  Final  C Difficile Quick Screen w PCR reflex     Status: None   Collection Time: 04/29/20  9:00 AM   Specimen: STOOL  Result Value Ref Range Status   C Diff antigen NEGATIVE NEGATIVE Final   C Diff toxin NEGATIVE NEGATIVE Final   C Diff interpretation No C. difficile detected.  Final    Comment: Performed at [facility], [place] [address] [place],  [address]    Lipid Panel Recent Labs    05/01/20 0308  TRIG 171*    Studies/Results: DG CHEST PORT 1 VIEW  Result Date: 05/01/2020 CLINICAL DATA:  Intubation EXAM: PORTABLE CHEST 1 VIEW COMPARISON:  Yesterday FINDINGS: Endotracheal tube tip is just below the clavicular heads. The enteric tube reaches the stomach at least. Streaky infiltrates about both hila and lower lobes. No edema, effusion, or pneumothorax. Normal heart size and mediastinal contours for technique. Artifact from EKG leads IMPRESSION: Stable hardware positioning and bilateral infiltrates. Electronically Signed   By: Monte Fantasia M.D.   On: 05/01/2020 08:13   DG CHEST PORT 1 VIEW  Result Date: 04/30/2020 CLINICAL DATA:  Intubation EXAM: PORTABLE CHEST 1 VIEW COMPARISON:  Yesterday FINDINGS: Endotracheal tube tip is just below the clavicular heads. The enteric tube reaches the stomach. Bilateral airspace disease. Stable heart size and mediastinal contours with distortion by rotation. IMPRESSION: 1. Unremarkable hardware positioning. 2. Bilateral airspace disease without convincing change Electronically Signed   By: Monte Fantasia M.D.   On: 04/30/2020 07:14   Overnight EEG with video  Result Date: 04/30/2020 Lora Havens, MD     04/30/2020  6:29 PM Patient Name: Penne Lash. MRN: [id number] Epilepsy Attending: Lora Havens Referring Physician/Provider: Eliseo Gum, NP Duration: 04/29/2020 1113 to 04/30/2020 1113  Patient history: 64 year old male with history of seizures on Keppra was noted to have further seizure-like activity.  EEG evaluate for  seizures.  Level of alertness: comatose  AEDs during EEG study: Keppra, VPA, versed, propofol  Technical aspects: This EEG study was done with scalp electrodes positioned according to the 10-20 International system of electrode placement. Electrical activity was acquired at a sampling rate of '500Hz'$  and reviewed with a high frequency filter of '70Hz'$  and a low frequency filter  of '1Hz'$ . EEG data were recorded continuously and digitally stored.  Description:  At the onset of the study, EEG showed seizures without clinical signs, avg 6-8/hour, arising from left and right hemisphere, lasting about 20 seconds on an average.  After around 1400 on 04/29/2020, EEG showed discontinuous pattern with 3-6 seconds of bursts of generalized as well as bilateral independent polyspikes alternating with 3-5 seconds of background suppression.  Hyperventilation and photic stimulation were not performed.    ABNORMALITY -Seizure without clinical signs, left and right hemisphere -Polyspikes, generalized and bilateral independent -Background suppression, generalized  IMPRESSION: This study  initially showed seizures without clinical signs, average 6-8/ hour arising from left and right hemisphere, lasting 20 seconds on an average.  After around 1400 on 12/31/2019, EEG showed evidence of generalized and bilateral independent left and right hemisphere epileptogenicity.  Additionally there is evidence of severe diffuse encephalopathy, nonspecific etiology.  Priyanka Barbra Sarks    Medications:  Scheduled: . chlorhexidine gluconate (MEDLINE KIT)  15 mL Mouth Rinse BID  . Chlorhexidine Gluconate Cloth  6 each Topical Q0600  . Chlorhexidine Gluconate Cloth  6 each Topical Q0600  . docusate  100 mg Per Tube BID  . feeding supplement (PRO-STAT SUGAR FREE 64)  30 mL Per Tube TID  . heparin  5,000 Units Subcutaneous Q8H  . insulin aspart  0-15 Units Subcutaneous Q4H  . insulin detemir  20 Units Subcutaneous BID  . mouth rinse  15 mL Mouth  Rinse 10 times per day  . pantoprazole sodium  40 mg Per Tube QHS  . valproic acid  400 mg Per Tube TID   Continuous: . sodium chloride Stopped (04/30/20 1223)  . cefTRIAXone (ROCEPHIN)  IV 1 g (05/01/20 1102)  . feeding supplement (VITAL 1.5 CAL) 50 mL/hr at 05/01/20 0400  . lacosamide (VIMPAT) IV 100 mg (05/01/20 1008)  . levETIRAcetam Stopped (04/30/20 2257)  . levETIRAcetam 500 mg (05/01/20 1155)  . midazolam 50 mg/hr (05/01/20 1040)  . phenylephrine (NEO-SYNEPHRINE) Adult infusion    . propofol (DIPRIVAN) infusion 60 mcg/kg/min (05/01/20 1203)   LTM EEG report summary for this AM:  FINDINGS: - Spikes, left and right hemisphere - Intermittent slow, generalized - Background suppression, generalized IMPRESSION: This studyshowed evidence of independent left and right hemisphere epileptigenicity as well as suggestive of severe diffuse encephalopathy, nonspecific to etiology. No definite seizures were seen during this study.   Assessment: 64 year old male with end-stage renal disease on hemodialysis who was admitted on 6/1 after presenting with hyperkalemia, acute hypoxic respiratory failure and pulmonary edema from volume overload in the setting of missed hemodialysis x 2.  EEG was obtained as part of encephalopathy work-up and showed 7 seizures, without clinical signs, arising from the left hemisphere.  Patient was started on long-term EEG and had multiple seizures (average 6 to 8/hr) arising from the left and right hemisphere.  Keppra dose was increased and Depakote was added. However, seizures persisted and therefore the patient was started on Versed. Subsequently, propofol and Versed were added.   --Nonconvulsive seizures, left and right hemisphere --Acute encephalopathy, secondary to seizures and medications --End-stage renal disease on hemodialysis --Acute respiratory failure due to pulmonary edema --Multiple metabolic and lab abnormalities including hyperphosphatemia,  hypoalbuminemia, hyperglycemia, microcytic anemia, thrombocytopenia, transaminitis, hyperbilirubinemia  --LTM EEG continues to show generalized independent left and right epileptiform discharges. No definite seizures were seen. --Etiology for seizures: Patient's Keppra was stopped, possible toxicity from missing hemodialysis  --Resolved hyperammonemia  Recommendations:  -Continue Vimpat 100 mg twice  daily -Continue valproic acid 400 mg every 8 hours.  Of note, patient is thrombocytopenic, therefore avoid increasing dose any further -Continue Keppra 1000 mg QD with an additional 500 mg dose after hemodialysis sessions. Due to renal impairment, cannot increase dose any further. -Starting Onfi at 5 mg QD -Continue Versed at 50 mg/h. -Tapering off propofol over 6 hours -Dr. Hortense Ramal discussed with patient's sister at bedside on Friday that patient has had prolonged seizures and there is concern about neurologic injury.  However, will know more once patient's sedation is turned off.  Patient;s sister expressed understanding. -Management of rest of comorbidities per primary team.  35 minutes spent in the neurological evaluation and management of this critically ill patient.   LOS: 4 days   '@Electronically'$  signed: Dr. Kerney Elbe 05/01/2020  12:25 PM

## 2020-05-02 LAB — RENAL FUNCTION PANEL
Albumin: 2.3 g/dL — ABNORMAL LOW (ref 3.5–5.0)
Albumin: 2.2 g/dL — ABNORMAL LOW (ref 3.5–5.0)
Anion gap: 17 — ABNORMAL HIGH (ref 5–15)
Anion gap: 15 (ref 5–15)
BUN: 52 mg/dL — ABNORMAL HIGH (ref 8–23)
BUN: 36 mg/dL — ABNORMAL HIGH (ref 8–23)
CO2: 24 mmol/L (ref 22–32)
CO2: 25 mmol/L (ref 22–32)
Calcium: 9.2 mg/dL (ref 8.9–10.3)
Calcium: 9.1 mg/dL (ref 8.9–10.3)
Chloride: 96 mmol/L — ABNORMAL LOW (ref 98–111)
Chloride: 96 mmol/L — ABNORMAL LOW (ref 98–111)
Creatinine, Ser: 4.32 mg/dL — ABNORMAL HIGH (ref 0.61–1.24)
Creatinine, Ser: 3.05 mg/dL — ABNORMAL HIGH (ref 0.61–1.24)
GFR calc Af Amer: 15 mL/min — ABNORMAL LOW (ref >60)
GFR calc Af Amer: 24 mL/min — ABNORMAL LOW (ref >60)
GFR calc non Af Amer: 13 mL/min — ABNORMAL LOW (ref >60)
GFR calc non Af Amer: 20 mL/min — ABNORMAL LOW (ref >60)
Glucose, Bld: 119 mg/dL — ABNORMAL HIGH (ref 70–99)
Glucose, Bld: 262 mg/dL — ABNORMAL HIGH (ref 70–99)
Phosphorus: 4.9 mg/dL — ABNORMAL HIGH (ref 2.5–4.6)
Phosphorus: 3.7 mg/dL (ref 2.5–4.6)
Potassium: 4.7 mmol/L (ref 3.5–5.1)
Potassium: 4.5 mmol/L (ref 3.5–5.1)
Sodium: 137 mmol/L (ref 135–145)
Sodium: 136 mmol/L (ref 135–145)

## 2020-05-02 LAB — GLUCOSE, CAPILLARY
Glucose-Capillary: 113 mg/dL — ABNORMAL HIGH (ref 70–99)
Glucose-Capillary: 121 mg/dL — ABNORMAL HIGH (ref 70–99)
Glucose-Capillary: 130 mg/dL — ABNORMAL HIGH (ref 70–99)
Glucose-Capillary: 218 mg/dL — ABNORMAL HIGH (ref 70–99)
Glucose-Capillary: 275 mg/dL — ABNORMAL HIGH (ref 70–99)
Glucose-Capillary: 295 mg/dL — ABNORMAL HIGH (ref 70–99)

## 2020-05-02 LAB — CBC
HCT: 33.1 % — ABNORMAL LOW (ref 39.0–52.0)
Hemoglobin: 10.2 g/dL — ABNORMAL LOW (ref 13.0–17.0)
MCH: 27.9 pg (ref 26.0–34.0)
MCHC: 30.8 g/dL (ref 30.0–36.0)
MCV: 90.4 fL (ref 80.0–100.0)
Platelets: 115 10*3/uL — ABNORMAL LOW (ref 150–400)
RBC: 3.66 MIL/uL — ABNORMAL LOW (ref 4.22–5.81)
RDW: 17.8 % — ABNORMAL HIGH (ref 11.5–15.5)
WBC: 5.2 10*3/uL (ref 4.0–10.5)
nRBC: 0 % (ref 0.0–0.2)

## 2020-05-02 LAB — HEPATIC FUNCTION PANEL
ALT: 234 U/L — ABNORMAL HIGH (ref 0–44)
AST: 248 U/L — ABNORMAL HIGH (ref 15–41)
Albumin: 2.5 g/dL — ABNORMAL LOW (ref 3.5–5.0)
Alkaline Phosphatase: 263 U/L — ABNORMAL HIGH (ref 38–126)
Bilirubin, Direct: 0.6 mg/dL — ABNORMAL HIGH (ref 0.0–0.2)
Indirect Bilirubin: 0.3 mg/dL (ref 0.3–0.9)
Total Bilirubin: 0.9 mg/dL (ref 0.3–1.2)
Total Protein: 6.5 g/dL (ref 6.5–8.1)

## 2020-05-02 LAB — TRIGLYCERIDES: Triglycerides: 161 mg/dL — ABNORMAL HIGH (ref <150)

## 2020-05-02 LAB — MAGNESIUM: Magnesium: 2 mg/dL (ref 1.7–2.4)

## 2020-05-02 LAB — CALCIUM, IONIZED: Calcium, Ionized, Serum: 4.4 mg/dL — ABNORMAL LOW (ref 4.5–5.6)

## 2020-05-02 MED ORDER — CHLORHEXIDINE GLUCONATE 0.12 % MT SOLN
OROMUCOSAL | Status: AC
  Filled 2020-05-02: qty 15

## 2020-05-02 MED ORDER — AMLODIPINE BESYLATE 5 MG PO TABS
5.0000 mg | ORAL_TABLET | Freq: Every day | ORAL | Status: DC
  Filled 2020-05-02: qty 1

## 2020-05-02 MED ORDER — AMLODIPINE BESYLATE 5 MG PO TABS
5.0000 mg | ORAL_TABLET | Freq: Every day | ORAL | Status: DC
  Administered 2020-05-02 – 2020-05-10 (×6): 5 mg
  Filled 2020-05-02 (×8): qty 1

## 2020-05-02 MED ORDER — CARVEDILOL 12.5 MG PO TABS
12.5000 mg | ORAL_TABLET | Freq: Two times a day (BID) | ORAL | Status: DC
  Administered 2020-05-02 – 2020-05-04 (×4): 12.5 mg
  Filled 2020-05-02 (×4): qty 1

## 2020-05-02 MED ORDER — HEPARIN SODIUM (PORCINE) 1000 UNIT/ML DIALYSIS
2000.0000 [IU] | INTRAMUSCULAR | Status: AC | PRN
  Administered 2020-05-03: 2000 [IU] via INTRAVENOUS_CENTRAL

## 2020-05-02 MED ORDER — CHLORHEXIDINE GLUCONATE CLOTH 2 % EX PADS
6.0000 | MEDICATED_PAD | Freq: Every day | CUTANEOUS | Status: DC
  Administered 2020-05-02 – 2020-05-10 (×7): 6 via TOPICAL

## 2020-05-02 MED ORDER — HEPARIN SODIUM (PORCINE) 1000 UNIT/ML IJ SOLN
INTRAMUSCULAR | Status: AC
  Administered 2020-05-02: 2000 [IU] via INTRAVENOUS_CENTRAL
  Filled 2020-05-02: qty 2

## 2020-05-02 NOTE — Progress Notes (Signed)
Writer wasted 7.66ml of midazolam 500 mg in sodium chloride with Mindy E., RN.

## 2020-05-02 NOTE — Progress Notes (Signed)
Subjective: Sedated on 50 mg/hr of Versed  Objective: Current vital signs: BP (!) 174/78   Pulse (!) 103   Temp 99.2 F (37.3 C)   Resp 18   Ht '5\' 8"'$  (1.727 m)   Wt 80 kg   SpO2 100%   BMI 26.82 kg/m  Vital signs in last 24 hours: Temp:  [97.1 F (36.2 C)-99.2 F (37.3 C)] 99.2 F (37.3 C) (06/06 0400) Pulse Rate:  [82-103] 103 (06/06 0600) Resp:  [17-22] 18 (06/06 0754) BP: (111-185)/(64-89) 174/78 (06/06 0600) SpO2:  [98 %-100 %] 100 % (06/06 0754) FiO2 (%):  [40 %] 40 % (06/06 0754) Weight:  [79.4 kg-80 kg] 80 kg (06/06 0331)  Intake/Output from previous day: 06/05 0701 - 06/06 0700 In: 2700.9 [I.V.:1088; ZO/XW:9604; IV Piggyback:369.9] Out: 3000  Intake/Output this shift: No intake/output data recorded. Nutritional status:  Diet Order            Diet NPO time specified  Diet effective now              General: Lying in bed,on high-dose IV sedation with Versed Lungs: Intubated Extremities: No cyanosis  Neuro: Ment:Note, exam performed while patient sedated on Versed. No evidence for cortical function in this context.  VW:UJWJXB are pinpoint. Eyes conjugate. No doll's eye reflex. Corneal reflexes absent, gag and cough reflexes absent. Face flaccidly symmetric.  (sedated on Versed) Motor/Sensory:Flaccid tone x 4. No movement to any external stimuli. (sedated on Versed) Reflexes: Hypoactive throughout  Lab Results: Results for orders placed or performed during the hospital encounter of 05/22/2020 (from the past 48 hour(s))  Glucose, capillary     Status: Abnormal   Collection Time: 04/30/20 11:31 AM  Result Value Ref Range   Glucose-Capillary 117 (H) 70 - 99 mg/dL    Comment: Glucose reference range applies only to samples taken after fasting for at least 8 hours.  Glucose, capillary     Status: Abnormal   Collection Time: 04/30/20  3:17 PM  Result Value Ref Range   Glucose-Capillary 121 (H) 70 - 99 mg/dL    Comment: Glucose reference range applies  only to samples taken after fasting for at least 8 hours.  Renal function panel     Status: Abnormal   Collection Time: 04/30/20  5:58 PM  Result Value Ref Range   Sodium 136 135 - 145 mmol/L   Potassium 4.1 3.5 - 5.1 mmol/L   Chloride 95 (L) 98 - 111 mmol/L   CO2 25 22 - 32 mmol/L   Glucose, Bld 185 (H) 70 - 99 mg/dL    Comment: Glucose reference range applies only to samples taken after fasting for at least 8 hours.   BUN 57 (H) 8 - 23 mg/dL   Creatinine, Ser 6.13 (H) 0.61 - 1.24 mg/dL   Calcium 8.7 (L) 8.9 - 10.3 mg/dL   Phosphorus 5.3 (H) 2.5 - 4.6 mg/dL   Albumin 2.5 (L) 3.5 - 5.0 g/dL   GFR calc non Af Amer 9 (L) >60 mL/min   GFR calc Af Amer 10 (L) >60 mL/min   Anion gap 16 (H) 5 - 15    Comment: Performed at [facility] [address], [place], [place] [address]  Glucose, capillary     Status: Abnormal   Collection Time: 04/30/20  8:01 PM  Result Value Ref Range   Glucose-Capillary 202 (H) 70 - 99 mg/dL    Comment: Glucose reference range applies only to samples taken after fasting for at  least 8 hours.  Glucose, capillary     Status: Abnormal   Collection Time: 05/01/20 12:07 AM  Result Value Ref Range   Glucose-Capillary 262 (H) 70 - 99 mg/dL    Comment: Glucose reference range applies only to samples taken after fasting for at least 8 hours.  Triglycerides     Status: Abnormal   Collection Time: 05/01/20  3:08 AM  Result Value Ref Range   Triglycerides 171 (H) <150 mg/dL    Comment: Performed at [facility] [address], [place], [place] [address]  Comprehensive metabolic panel     Status: Abnormal   Collection Time: 05/01/20  3:08 AM  Result Value Ref Range   Sodium 135 135 - 145 mmol/L   Potassium 4.5 3.5 - 5.1 mmol/L   Chloride 93 (L) 98 - 111 mmol/L   CO2 25 22 - 32 mmol/L   Glucose, Bld 253 (H) 70 - 99 mg/dL    Comment: Glucose reference range applies only to samples taken after fasting for at least 8 hours.   BUN 68 (H) 8 - 23 mg/dL    Creatinine, Ser 6.23 (H) 0.61 - 1.24 mg/dL   Calcium 8.8 (L) 8.9 - 10.3 mg/dL   Total Protein 6.3 (L) 6.5 - 8.1 g/dL   Albumin 2.5 (L) 3.5 - 5.0 g/dL   AST 240 (H) 15 - 41 U/L   ALT 160 (H) 0 - 44 U/L   Alkaline Phosphatase 124 38 - 126 U/L   Total Bilirubin 0.6 0.3 - 1.2 mg/dL   GFR calc non Af Amer 9 (L) >60 mL/min   GFR calc Af Amer 10 (L) >60 mL/min   Anion gap 17 (H) 5 - 15    Comment: Performed at [facility], [place] [address] [place], [place] [address]  CBC     Status: Abnormal   Collection Time: 05/01/20  3:08 AM  Result Value Ref Range   WBC 4.2 4.0 - 10.5 K/uL   RBC 3.35 (L) 4.22 - 5.81 MIL/uL   Hemoglobin 9.2 (L) 13.0 - 17.0 g/dL   HCT 30.1 (L) 39.0 - 52.0 %   MCV 89.9 80.0 - 100.0 fL   MCH 27.5 26.0 - 34.0 pg   MCHC 30.6 30.0 - 36.0 g/dL   RDW 17.5 (H) 11.5 - 15.5 %   Platelets 90 (L) 150 - 400 K/uL    Comment: CONSISTENT WITH PREVIOUS RESULT Immature Platelet Fraction may be clinically indicated, consider ordering this additional test [id number] REPEATED TO VERIFY    nRBC 0.5 (H) 0.0 - 0.2 %    Comment: Performed at [facility], [place] [address], [place], [place] [address]  Magnesium     Status: None   Collection Time: 05/01/20  3:08 AM  Result Value Ref Range   Magnesium 2.0 1.7 - 2.4 mg/dL    Comment: Performed at [facility] [address], [place], [place] [address]  Ammonia     Status: None   Collection Time: 05/01/20  3:12 AM  Result Value Ref Range   Ammonia 25 9 - 35 umol/L    Comment: Performed at [facility], [place] [address], [place],  [address]  Glucose, capillary     Status: Abnormal   Collection Time: 05/01/20  3:20 AM  Result Value Ref Range   Glucose-Capillary 211 (H) 70 - 99 mg/dL    Comment: Glucose reference range applies only to samples taken after fasting for  at least 8 hours.  Glucose, capillary     Status: Abnormal   Collection Time: 05/01/20  7:48 AM  Result Value Ref Range   Glucose-Capillary 186 (H)  70 - 99 mg/dL    Comment: Glucose reference range applies only to samples taken after fasting for at least 8 hours.  Renal function panel     Status: Abnormal   Collection Time: 05/01/20  8:37 AM  Result Value Ref Range   Sodium 136 135 - 145 mmol/L   Potassium 4.8 3.5 - 5.1 mmol/L   Chloride 95 (L) 98 - 111 mmol/L   CO2 24 22 - 32 mmol/L   Glucose, Bld 191 (H) 70 - 99 mg/dL    Comment: Glucose reference range applies only to samples taken after fasting for at least 8 hours.   BUN 71 (H) 8 - 23 mg/dL   Creatinine, Ser 6.29 (H) 0.61 - 1.24 mg/dL   Calcium 8.8 (L) 8.9 - 10.3 mg/dL   Phosphorus 5.3 (H) 2.5 - 4.6 mg/dL   Albumin 2.5 (L) 3.5 - 5.0 g/dL   GFR calc non Af Amer 8 (L) >60 mL/min   GFR calc Af Amer 10 (L) >60 mL/min   Anion gap 17 (H) 5 - 15    Comment: Performed at [facility] [address], [place], [place] [address]  Glucose, capillary     Status: Abnormal   Collection Time: 05/01/20 12:29 PM  Result Value Ref Range   Glucose-Capillary 184 (H) 70 - 99 mg/dL    Comment: Glucose reference range applies only to samples taken after fasting for at least 8 hours.  Glucose, capillary     Status: Abnormal   Collection Time: 05/01/20  3:48 PM  Result Value Ref Range   Glucose-Capillary 149 (H) 70 - 99 mg/dL    Comment: Glucose reference range applies only to samples taken after fasting for at least 8 hours.  Renal function panel     Status: Abnormal   Collection Time: 05/01/20  5:14 PM  Result Value Ref Range   Sodium 136 135 - 145 mmol/L   Potassium 4.1 3.5 - 5.1 mmol/L   Chloride 97 (L) 98 - 111 mmol/L   CO2 27 22 - 32 mmol/L   Glucose, Bld 177 (H) 70 - 99 mg/dL    Comment: Glucose reference range applies only to samples taken after fasting for at least 8 hours.   BUN 32 (H) 8 - 23 mg/dL   Creatinine, Ser 3.42 (H) 0.61 - 1.24 mg/dL    Comment: DIALYSIS   Calcium 8.9 8.9 - 10.3 mg/dL   Phosphorus 4.2 2.5 - 4.6 mg/dL   Albumin 2.4 (L) 3.5 - 5.0 g/dL   GFR calc  non Af Amer 18 (L) >60 mL/min   GFR calc Af Amer 20 (L) >60 mL/min   Anion gap 12 5 - 15    Comment: Performed at [facility] [address] [place], [place] [address]  Glucose, capillary     Status: Abnormal   Collection Time: 05/01/20  7:50 PM  Result Value Ref Range   Glucose-Capillary 139 (H) 70 - 99 mg/dL    Comment: Glucose reference range applies only to samples taken after fasting for at least 8 hours.   Comment 1 Notify RN   Glucose, capillary     Status: Abnormal   Collection Time: 05/01/20 11:35 PM  Result Value Ref Range   Glucose-Capillary 155 (H) 70 - 99 mg/dL  Comment: Glucose reference range applies only to samples taken after fasting for at least 8 hours.   Comment 1 Notify RN   Glucose, capillary     Status: Abnormal   Collection Time: 05/02/20  4:04 AM  Result Value Ref Range   Glucose-Capillary 130 (H) 70 - 99 mg/dL    Comment: Glucose reference range applies only to samples taken after fasting for at least 8 hours.  CBC     Status: Abnormal   Collection Time: 05/02/20  4:19 AM  Result Value Ref Range   WBC 5.2 4.0 - 10.5 K/uL   RBC 3.66 (L) 4.22 - 5.81 MIL/uL   Hemoglobin 10.2 (L) 13.0 - 17.0 g/dL   HCT 33.1 (L) 39.0 - 52.0 %   MCV 90.4 80.0 - 100.0 fL   MCH 27.9 26.0 - 34.0 pg   MCHC 30.8 30.0 - 36.0 g/dL   RDW 17.8 (H) 11.5 - 15.5 %   Platelets 115 (L) 150 - 400 K/uL    Comment: SPECIMEN CHECKED FOR CLOTS CONSISTENT WITH PREVIOUS RESULT REPEATED TO VERIFY    nRBC 0.0 0.0 - 0.2 %    Comment: Performed at [facility], [place] [address] [place], [place] [address]  Hepatic function panel     Status: Abnormal   Collection Time: 05/02/20  4:19 AM  Result Value Ref Range   Total Protein 6.5 6.5 - 8.1 g/dL   Albumin 2.5 (L) 3.5 - 5.0 g/dL   AST 248 (H) 15 - 41 U/L   ALT 234 (H) 0 - 44 U/L   Alkaline Phosphatase 263 (H) 38 - 126 U/L   Total Bilirubin 0.9 0.3 - 1.2 mg/dL   Bilirubin, Direct 0.6 (H) 0.0 - 0.2 mg/dL   Indirect Bilirubin 0.3  0.3 - 0.9 mg/dL    Comment: Performed at [facility] [address], [place], [place] [address]  Magnesium     Status: None   Collection Time: 05/02/20  4:19 AM  Result Value Ref Range   Magnesium 2.0 1.7 - 2.4 mg/dL    Comment: Performed at [facility] [address], [place], [place] [address]  Triglycerides     Status: Abnormal   Collection Time: 05/02/20  4:19 AM  Result Value Ref Range   Triglycerides 161 (H) <150 mg/dL    Comment: Performed at [facility] [address] [place], [place] [address]  Glucose, capillary     Status: Abnormal   Collection Time: 05/02/20  7:31 AM  Result Value Ref Range   Glucose-Capillary 121 (H) 70 - 99 mg/dL    Comment: Glucose reference range applies only to samples taken after fasting for at least 8 hours.    Recent Results (from the past 240 hour(s))  SARS Coronavirus 2 by RT PCR (hospital order, performed in [facility] hospital lab) Nasopharyngeal Nasopharyngeal Swab     Status: None   Collection Time: 05/21/2020  8:24 PM   Specimen: Nasopharyngeal Swab  Result Value Ref Range Status   SARS Coronavirus 2 NEGATIVE NEGATIVE Final    Comment: (NOTE) SARS-CoV-2 target nucleic acids are NOT DETECTED. The SARS-CoV-2 RNA is generally detectable in upper and lower respiratory specimens during the acute phase of infection. The lowest concentration of SARS-CoV-2 viral copies this assay can detect is 250 copies / mL. A negative result does not preclude SARS-CoV-2 infection and should not be used as the sole basis for treatment or other patient management decisions.  A negative result may occur with improper specimen collection / handling, submission of specimen other than nasopharyngeal swab, presence of viral mutation(s) within the areas targeted by this assay, and inadequate number of viral copies (<250 copies / mL). A negative result must be combined with clinical observations, patient history, and epidemiological  information. Fact Sheet for Patients:   StrictlyIdeas.no Fact Sheet for Healthcare Providers: BankingDealers.co.za This test is not yet approved or cleared  by the [place] FDA and has been authorized for detection and/or diagnosis of SARS-CoV-2 by FDA under an Emergency Use Authorization (EUA).  This EUA will remain in effect (meaning this test can be used) for the duration of the COVID-19 declaration under Section 564(b)(1) of the Act, 21 U.S.C. section 360bbb-3(b)(1), unless the authorization is terminated or revoked sooner. Performed at [facility], [place] [address] [place], [place] [address]   MRSA PCR Screening     Status: None   Collection Time: 05/16/2020 11:16 PM   Specimen: Nasal Mucosa; Nasopharyngeal  Result Value Ref Range Status   MRSA by PCR NEGATIVE NEGATIVE Final    Comment:        The GeneXpert MRSA Assay (FDA approved for NASAL specimens only), is one component of a comprehensive MRSA colonization surveillance program. It is not intended to diagnose MRSA infection nor to guide or monitor treatment for MRSA infections. Performed at [facility], [place] [address], [place], [place] [address]   Culture, blood (routine x 2)     Status: None (Preliminary result)   Collection Time: 04/29/20  1:43 AM   Specimen: BLOOD  Result Value Ref Range Status   Specimen Description BLOOD RIGHT HAND  Final   Special Requests   Final    BOTTLES DRAWN AEROBIC AND ANAEROBIC Blood Culture adequate volume   Culture   Final    NO GROWTH 2 DAYS Performed at [facility], [place] [address], [place], [place] [address]    Report Status PENDING  Incomplete  Culture, blood (routine x 2)     Status: None (Preliminary result)   Collection Time: 04/29/20  1:53 AM   Specimen: BLOOD  Result Value Ref Range Status   Specimen Description BLOOD RIGHT HAND  Final   Special Requests   Final    BOTTLES DRAWN AEROBIC ONLY Blood  Culture results may not be optimal due to an inadequate volume of blood received in culture bottles   Culture   Final    NO GROWTH 2 DAYS Performed at [facility], [place] [address] [place], [place] [address]    Report Status PENDING  Incomplete  Culture, respiratory (non-expectorated)     Status: None   Collection Time: 04/29/20  2:08 AM   Specimen: Tracheal Aspirate; Respiratory  Result Value Ref Range Status   Specimen Description TRACHEAL ASPIRATE  Final   Special Requests Normal  Final   Gram Stain   Final    RARE WBC PRESENT, PREDOMINANTLY PMN FEW GRAM POSITIVE COCCI    Culture   Final    Consistent with normal respiratory flora. Performed at [facility], [place] [address], [place], [place] [address]    Report Status 05/01/2020 FINAL  Final  C Difficile Quick Screen w PCR reflex     Status: None   Collection Time: 04/29/20  9:00 AM   Specimen: STOOL  Result Value Ref Range Status   C Diff antigen NEGATIVE NEGATIVE Final   C Diff toxin NEGATIVE NEGATIVE  Final   C Diff interpretation No C. difficile detected.  Final    Comment: Performed at [facility], [place] [address], [place], [place] [address]    Lipid Panel Recent Labs    05/02/20 0419  TRIG 161*    Studies/Results: DG CHEST PORT 1 VIEW  Result Date: 05/01/2020 CLINICAL DATA:  Intubation EXAM: PORTABLE CHEST 1 VIEW COMPARISON:  Yesterday FINDINGS: Endotracheal tube tip is just below the clavicular heads. The enteric tube reaches the stomach at least. Streaky infiltrates about both hila and lower lobes. No edema, effusion, or pneumothorax. Normal heart size and mediastinal contours for technique. Artifact from EKG leads IMPRESSION: Stable hardware positioning and bilateral infiltrates. Electronically Signed   By: Monte Fantasia M.D.   On: 05/01/2020 08:13   Overnight EEG with video  Result Date: 04/30/2020 Lora Havens, MD     04/30/2020  6:29 PM Patient Name: Penne Lash. MRN: [id number]  Epilepsy Attending: Lora Havens Referring Physician/Provider: Eliseo Gum, NP Duration: 04/29/2020 1113 to 04/30/2020 1113  Patient history: 64 year old male with history of seizures on Keppra was noted to have further seizure-like activity.  EEG evaluate for seizures.  Level of alertness: comatose  AEDs during EEG study: Keppra, VPA, versed, propofol  Technical aspects: This EEG study was done with scalp electrodes positioned according to the 10-20 International system of electrode placement. Electrical activity was acquired at a sampling rate of '500Hz'$  and reviewed with a high frequency filter of '70Hz'$  and a low frequency filter of '1Hz'$ . EEG data were recorded continuously and digitally stored.  Description:  At the onset of the study, EEG showed seizures without clinical signs, avg 6-8/hour, arising from left and right hemisphere, lasting about 20 seconds on an average.  After around 1400 on 04/29/2020, EEG showed discontinuous pattern with 3-6 seconds of bursts of generalized as well as bilateral independent polyspikes alternating with 3-5 seconds of background suppression.  Hyperventilation and photic stimulation were not performed.    ABNORMALITY -Seizure without clinical signs, left and right hemisphere -Polyspikes, generalized and bilateral independent -Background suppression, generalized  IMPRESSION: This study  initially showed seizures without clinical signs, average 6-8/ hour arising from left and right hemisphere, lasting 20 seconds on an average.  After around 1400 on 12/31/2019, EEG showed evidence of generalized and bilateral independent left and right hemisphere epileptogenicity.  Additionally there is evidence of severe diffuse encephalopathy, nonspecific etiology.  Priyanka Barbra Sarks    Medications:  Prior to Admission:  Medications Prior to Admission  Medication Sig Dispense Refill Last Dose  . amLODipine (NORVASC) 10 MG tablet Take 1 tablet (10 mg total) by mouth daily. (Patient taking  differently: Take 10 mg by mouth at bedtime. ) 30 tablet 0 04/26/2020  . atorvastatin (LIPITOR) 20 MG tablet TAKE 1 TABLET BY MOUTH ONCE DAILY (Patient taking differently: Take 20 mg by mouth every evening. ) 90 tablet 0 04/26/2020  . cinacalcet (SENSIPAR) 90 MG tablet Take 90 mg by mouth every evening.   04/26/2020  . hydrALAZINE (APRESOLINE) 25 MG tablet Take 25 mg by mouth 3 (three) times daily.   04/26/2020  . insulin NPH Human (NOVOLIN N) 100 UNIT/ML injection Inject 20 units every morning (Patient taking differently: Inject 30 Units into the skin daily before breakfast. ) 10 mL 5 04/26/2020  . isosorbide mononitrate (IMDUR) 30 MG 24 hr tablet Take 1 tablet by mouth once daily (Patient taking differently: Take 30 mg by mouth daily. ) 30 tablet 0 04/26/2020  .  labetalol (NORMODYNE) 200 MG tablet Take 200 mg by mouth 2 (two) times daily.   04/26/2020  . lidocaine-prilocaine (EMLA) cream Apply 1 application topically See admin instructions. Apply 1-2 hours before dialysis and cover with occlusive dressing (saran wrap).   04/24/2020  . LOKELMA 10 g PACK packet Take 1 packet by mouth daily.   04/26/2020  . NUBEQA 300 MG tablet Take 300-600 mg by mouth See admin instructions. '600mg'$  in the morning and '300mg'$  in the evening   04/26/2020  . pantoprazole (PROTONIX) 40 MG tablet TAKE 1 TABLET BY MOUTH ONCE DAILY AT 6AM (Patient taking differently: Take 40 mg by mouth 2 (two) times daily. ) 90 tablet 1 04/26/2020  . sevelamer carbonate (RENVELA) 800 MG tablet Take 3 tablets (2,400 mg total) by mouth 3 (three) times daily with meals. 240 tablet 1 04/26/2020  . venlafaxine (EFFEXOR) 50 MG tablet Take 50 mg by mouth at bedtime.   04/26/2020  . Vitamin D, Ergocalciferol, (DRISDOL) 1.25 MG (50000 UNIT) CAPS capsule Take 50,000 Units by mouth once a week. Sundays   04/25/2020  . levETIRAcetam (KEPPRA) 250 MG tablet Take 1 tablet by mouth twice daily (Patient not taking: No sig reported) 180 tablet 1 Not Taking at Unknown time  .  metoprolol tartrate (LOPRESSOR) 100 MG tablet Take 1 tablet (100 mg total) by mouth 2 (two) times daily. (Patient not taking: Reported on 04/28/2020) 60 tablet 1 Not Taking at Unknown time  . ondansetron (ZOFRAN ODT) 4 MG disintegrating tablet Take 1 tablet (4 mg total) by mouth every 8 (eight) hours as needed for nausea or vomiting. (Patient not taking: Reported on 04/28/2020) 20 tablet 0 Not Taking at Unknown time   Scheduled: . chlorhexidine gluconate (MEDLINE KIT)  15 mL Mouth Rinse BID  . Chlorhexidine Gluconate Cloth  6 each Topical Q0600  . cloBAZam  5 mg Per Tube Daily  . docusate  100 mg Per Tube BID  . feeding supplement (PRO-STAT SUGAR FREE 64)  30 mL Per Tube TID  . heparin  5,000 Units Subcutaneous Q8H  . heparin sodium (porcine)      . insulin aspart  0-15 Units Subcutaneous Q4H  . insulin detemir  20 Units Subcutaneous BID  . mouth rinse  15 mL Mouth Rinse 10 times per day  . pantoprazole sodium  40 mg Per Tube QHS  . valproic acid  400 mg Per Tube TID   Continuous: . sodium chloride 10 mL/hr at 05/02/20 0600  . cefTRIAXone (ROCEPHIN)  IV Stopped (05/01/20 1132)  . feeding supplement (VITAL 1.5 CAL) 50 mL/hr at 05/02/20 0300  . lacosamide (VIMPAT) IV Stopped (05/01/20 2324)  . levETIRAcetam Stopped (05/01/20 2229)  . levETIRAcetam Stopped (05/01/20 1211)  . midazolam 50 mg/hr (05/02/20 0400)  . phenylephrine (NEO-SYNEPHRINE) Adult infusion      LTM EEG report for this AM (6/6):  This studyshowed evidence of independent left and right hemisphere epileptigenicity as well as suggestive ofsevere diffuse encephalopathy, nonspecific etiology.No definite seizures were seen during this study.   Assessment: 64 year old male with end-stage renal disease on hemodialysis who was admitted on 6/1 after presenting with hyperkalemia, acute hypoxic respiratory failure and pulmonary edema from volume overload in the setting of missed hemodialysis x 2. EEG was obtained as part of  encephalopathy work-up and showed 7 seizures, without clinical signs, arising from the left hemisphere. Patient was started on long-term EEG and had multiple seizures (average 6 to 8/hr) arising from the left and right hemisphere. Keppra dose  was increased and Depakote was added. However, seizures persisted and therefore the patient was started on Versed. Subsequently, propofol and Versed were added. Propofol was stopped yesterday.  --Nonconvulsive seizures, left and right hemisphere --Acute encephalopathy, secondary to seizures and medications --End-stage renal disease on hemodialysis --Acute respiratory failure due to pulmonary edema --Multiple metabolic and lab abnormalities including hyperphosphatemia, hypoalbuminemia, hyperglycemia, microcytic anemia, thrombocytopenia, transaminitis, hyperbilirubinemia  --LTM EEG continues to show severe diffuse encephalopathy and generalized independent left and right epileptiform discharges. No definite seizures were seen. --Etiology for seizures: Patient's Keppra was stopped as an outpatient; possible toxicity from missing hemodialysis --Resolved hyperammonemia  Recommendations:  -Continue Vimpat 100 mg twice daily -Continue valproic acid 400 mg every 8 hours.Of note, patient is thrombocytopenic, therefore avoid increasing dose any further -Continue Keppra'1000mg'$  QD with an additional 500 mg dose after hemodialysis sessions. Due to renal impairment, cannot increase dose any further. -Starting Onfi at 5 mg QD -Stopping Versed. Due to impaired renal function this will effectively result in a slow taper due to slow elimination. -Dr. Hortense Ramal discussed with patient's sister at bedside on Friday that patient has had prolonged seizures andthere is concern about neurologic injury. However, will know more once patient's sedation is turned off. Patient;s sister expressed understanding. -Management of rest of comorbidities per primary team.  35 minutes spent  in the neurological evaluation and management of this critically ill patient.    LOS: 5 days   '@Electronically'$  signed: Dr. Kerney Elbe 05/02/2020  8:36 AM

## 2020-05-02 NOTE — Progress Notes (Signed)
Assisted tele visit to patient with family members.  Hortense Cantrall Ann, RN  

## 2020-05-02 NOTE — Progress Notes (Signed)
NAME:  Jared [name], Jared [name] MRN:  [id number], DOB:  July 26, 1956, LOS: 5 ADMISSION DATE:  05/06/2020, CONSULTATION DATE: 6/1 REFERRING MD: Dr. Tyrone Nine EDP, CHIEF COMPLAINT: Unresponsiveness  Brief History   64 year old male with ESRD on HD unresponsive from home.  Intubated in ED.  Has missed 2 dialysis sessions.  Hyperkalemic and volume overloaded in the ED.  Admitted to ICU for emergent hemodialysis.  Past Medical History   has a past medical history of Anemia, Cancer ([place]), Chronic kidney disease, Diabetes mellitus without complication ([place]), GERD (gastroesophageal reflux disease), Hypertension, Seizures ([place]), Stroke ([place]), and Thyroid disease.  Significant Hospital Events   6/1 admit for emergent dialysis. Intubated 6/2 Seizures 6/4 remains in status epilepticus, shock due to heavy sedation.  6/5 Bps improved, starting to wean sedation  Consults:  Nephrology Neurology  Procedures:  ETT 6/1> R Fem CVC 6/3>  Significant Diagnostic Tests:  CT head 6/1 > chronic microvascular ischemic changes, volume loss. No acute intracranial abnormalities CXR 6/2> improved pulmonary edema  MRI Brain 6/2> no acute intracranial abnormality, no findings suggestive of PRES.  CXR 6/3> lower lung volumes. 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 Bowel loops visible.   LTM> continues to demonstrate seizure activity, averaging 6-8/hr from L and R hemisphere   Micro Data:  All neg  Antimicrobials:  6/4>> rocephin  Interim history/subjective:  No events, tapering off gtt and up on AEDs at neurology's direction.  Objective   Blood pressure (!) 174/78, pulse (!) 103, temperature 99.2 F (37.3 C), resp. rate 20, height 5\' 8"  (1.727 m), weight 80 kg, SpO2 98 %.    Vent Mode: PRVC FiO2 (%):  [40 %] 40 % Set Rate:  [18 bmp] 18 bmp Vt Set:  [550 mL] 550 mL PEEP:  [8 cmH20] 8 cmH20 Plateau Pressure:  [21 cmH20-22 cmH20] 21 cmH20   Intake/Output Summary (Last 24 hours) at 05/02/2020  0708 Last data filed at 05/02/2020 0600 Gross per 24 hour  Intake 2700.9 ml  Output 3000 ml  Net -299.1 ml   Filed Weights   05/01/20 0415 05/01/20 1124 05/02/20 0331  Weight: 81.1 kg 79.4 kg 80 kg    Examination: GEN: Sedated ill-appearing man on vent HEENT: Endotracheal tube in place with scant mucoid secretions CV: Heart sounds are regular, extremities are warm PULM: There are rhonchi bilaterally, he is passive on ventilator GI: Soft, +BS, less distended EXT: He has mild diffuse anasarca, he has a left upper extremity fistula NEURO: He is profoundly sedated, his pupils are pinpoint, there is no response to pain PSYCH: Cannot assess SKIN: No rashes  CBC/CMP stable, blood sugars are better  Central vascular congestion and bilateral airspace disease on chest x-ray 05/01/20  Resolved Hospital Problem list     Assessment & Plan:   Status epilepticus-MRI without evidence of PRES.  Has hx of seizures in past.   VEEG monitoring ongoing. - AEDs and sedation titration per neurology, appreciate help  Acute hypoxic respiratory failure- due to inability to handle secretions, aspiration, volume overload - fluid removal by HD - cultures unremarkable to date, fevers resolved - VAP prevention bundle  Aspiration pneumonitis vs. Pneumonia- with fevers, leukocytosis, MRSA swab neg, tracheal aspirate neg - Short course of ceftriaxone reasonable, end date placed for 6/7  ESRD on HD  - Continue iHD, negative as possible from my standpoint  Hypotension related to sedation - pressors PRN to maintain MAP 65  DM, poorly controlled -A1c 9.6 - continue levemir, monitor SSI  requirements  Pancytopenia- normocytic, stable, question if related to cancer vs. Meds  Hx prostate cancer-followed by [facility] , do not have nubeqa on formulary  Loose Stools-present on admission, C diff neg -BMS  Best practice:  Diet: TF Pain/Anxiety/Delirium protocol (if indicated): per neurology VAP protocol (if  indicated): yes DVT prophylaxis: SQH GI prophylaxis: PPI Glucose control: SSI  + levemir  Mobility: BR Code Status: FULL  Family Communication: will reach out if they come to bedside Disposition: ICU    The patient is critically ill with multiple organ systems failure and requires high complexity decision making for assessment and support, frequent evaluation and titration of therapies, application of advanced monitoring technologies and extensive interpretation of multiple databases. Critical Care Time devoted to patient care services described in this note independent of APP/resident time (if applicable)  is 35 minutes.   Erskine Emery MD [facility] 05/02/2020 7:08 AM Personal pager: [phone] If unanswered, please page CCM On-call: [phone]

## 2020-05-02 NOTE — Procedures (Addendum)
Patient Name:Jared [name]. BTC:[id number] Epilepsy Attending:Ellis Koffler Barbra Sarks Referring Physician/Provider:Grace Bowser, NP Duration:05/01/2020 1113 to 6/6/20211113  Patient history:64 year old male with history of seizures on Keppra was noted to have further seizure-like activity. EEG evaluate for seizures.  Level of alertness:comatose  AEDs during EEG study:Keppra,VPA, vimpat, versed  Technical aspects: This EEG study was done with scalp electrodes positioned according to the 10-20 International system of electrode placement. Electrical activity was acquired at a sampling rate of 500Hz  and reviewed with a high frequency filter of 70Hz  and a low frequency filter of 1Hz . EEG data were recorded continuously and digitally stored.   Description: EEG initially showed discontinuous pattern with 3-10seconds of generalized sharply contoured polymorphic 3-6hz  theta-delta slowing alternating with 3-5seconds of background suppression. Gradually, eeg became more continuous and showed continuous generalized polymorphic sharply contoured 3-6hz  theta-delta slowing admixed with left and right hemispheric independent spikes and polyspikes. Hyperventilation and photic stimulation were not performed.   ABNORMALITY - Spikes, left and right hemisphere - Continuous slow, generalized  IMPRESSION: This studyshowed evidence of independent left and right hemisphere epileptigenicity as well as suggestive of severe diffuse encephalopathy, nonspecific etiology. No definite seizures were seen during this study.   Haani Bakula Barbra Sarks

## 2020-05-02 NOTE — Progress Notes (Signed)
Wasted 400 ml of Midazolam with Jeannie Fend, RN.

## 2020-05-02 NOTE — Progress Notes (Signed)
LTM maint complete - no skin breakdown under:  fp2 f4 f8 a2

## 2020-05-02 NOTE — Progress Notes (Signed)
[facility] Progress Note  Subjective: pt on vent , off propfol, on versed I believe  Vitals:   05/02/20 1130 05/02/20 1145 05/02/20 1200 05/02/20 1252  BP: (!) 178/89 (!) 168/88 (!) 172/92 (!) 162/89  Pulse: (!) 110 (!) 110 (!) 110 (!) 106  Resp: (!) 21 (!) 21 (!) 21   Temp:      TempSrc:      SpO2: 98% 98% 98%   Weight:      Height:        Exam:   On vent and sedated  no jvd  Chest cta bilat  Cor reg no RG  Abd soft ntnd no ascites   Ext +2 UE> LE edema   Sedated , not responsive    LUA aVF+bruit    OP HD: TTS NW  4h  400/800  2/2 bath 72.5kg  Hep 2300   L AVF   - hect 7 ug  Assessment/ Plan: 1. Seizures - non-convulsive seizures per EEG. Started on propofol and Versed. Per neuro/ pmd. MRI negative, no evidence of PRES.  2. Fever/ pulm infiltrates - possible PNA improving. Getting IV abx 3. AMS: admit creat 13.12, poss some element of uremia on admit. Creat down now w/ HD. 4. Acute resp failure/ vol overload/ pulm edema - up 10 kg + due to large volumes w/ mult drips and seizure meds. Will need serial HD to get vol down.  5. ESRD - on HD TTS. HD yest w/ 3L off, plan HD again today and Monday.  6. Secondary hyperPTH: cont outpt hectorol dose   Rob Izeyah Deike 05/02/2020, 1:05 PM   Recent Labs  Lab 05/01/20 0308 05/01/20 0837 05/01/20 1714 05/02/20 0419 05/02/20 0751  K 4.5   < > 4.1  --  4.7  BUN 68*   < > 32*  --  52*  CREATININE 6.23*   < > 3.42*  --  4.32*  CALCIUM 8.8*   < > 8.9  --  9.2  PHOS  --    < > 4.2  --  4.9*  HGB 9.2*  --   --  10.2*  --    < > = values in this interval not displayed.   Inpatient medications: . amLODipine  5 mg Per Tube Daily  . carvedilol  12.5 mg Per Tube BID WC  . chlorhexidine gluconate (MEDLINE KIT)  15 mL Mouth Rinse BID  . Chlorhexidine Gluconate Cloth  6 each Topical Q0600  . cloBAZam  5 mg Per Tube Daily  . docusate  100 mg Per Tube BID  . feeding supplement (PRO-STAT SUGAR FREE 64)  30 mL Per Tube TID   . heparin  5,000 Units Subcutaneous Q8H  . insulin aspart  0-15 Units Subcutaneous Q4H  . insulin detemir  20 Units Subcutaneous BID  . mouth rinse  15 mL Mouth Rinse 10 times per day  . pantoprazole sodium  40 mg Per Tube QHS  . valproic acid  400 mg Per Tube TID   . sodium chloride 10 mL/hr at 05/02/20 1200  . cefTRIAXone (ROCEPHIN)  IV 1 g (05/02/20 1252)  . feeding supplement (VITAL 1.5 CAL) 50 mL/hr at 05/02/20 0300  . lacosamide (VIMPAT) IV Stopped (05/02/20 1120)  . levETIRAcetam Stopped (05/01/20 2229)  . levETIRAcetam Stopped (05/01/20 1211)  . midazolam Stopped (05/02/20 1150)  . phenylephrine (NEO-SYNEPHRINE) Adult infusion     acetaminophen (TYLENOL) oral liquid 160 mg/5 mL, dextrose, docusate sodium, fentaNYL (SUBLIMAZE) injection, fentaNYL (SUBLIMAZE)  injection, [START ON 05/03/2020] heparin, hydrALAZINE, polyethylene glycol

## 2020-05-03 DIAGNOSIS — R4020 Unspecified coma: Secondary | ICD-10-CM

## 2020-05-03 DIAGNOSIS — J9601 Acute respiratory failure with hypoxia: Secondary | ICD-10-CM

## 2020-05-03 LAB — RENAL FUNCTION PANEL
Albumin: 2.4 g/dL — ABNORMAL LOW (ref 3.5–5.0)
Albumin: 2.5 g/dL — ABNORMAL LOW (ref 3.5–5.0)
Anion gap: 14 (ref 5–15)
Anion gap: 15 (ref 5–15)
BUN: 55 mg/dL — ABNORMAL HIGH (ref 8–23)
BUN: 49 mg/dL — ABNORMAL HIGH (ref 8–23)
CO2: 27 mmol/L (ref 22–32)
CO2: 26 mmol/L (ref 22–32)
Calcium: 9.7 mg/dL (ref 8.9–10.3)
Calcium: 10.3 mg/dL (ref 8.9–10.3)
Chloride: 94 mmol/L — ABNORMAL LOW (ref 98–111)
Chloride: 95 mmol/L — ABNORMAL LOW (ref 98–111)
Creatinine, Ser: 3.67 mg/dL — ABNORMAL HIGH (ref 0.61–1.24)
Creatinine, Ser: 3.03 mg/dL — ABNORMAL HIGH (ref 0.61–1.24)
GFR calc Af Amer: 19 mL/min — ABNORMAL LOW (ref >60)
GFR calc Af Amer: 24 mL/min — ABNORMAL LOW (ref >60)
GFR calc non Af Amer: 16 mL/min — ABNORMAL LOW (ref >60)
GFR calc non Af Amer: 20 mL/min — ABNORMAL LOW (ref >60)
Glucose, Bld: 244 mg/dL — ABNORMAL HIGH (ref 70–99)
Glucose, Bld: 184 mg/dL — ABNORMAL HIGH (ref 70–99)
Phosphorus: 4.2 mg/dL (ref 2.5–4.6)
Phosphorus: 4.1 mg/dL (ref 2.5–4.6)
Potassium: 4.4 mmol/L (ref 3.5–5.1)
Potassium: 5 mmol/L (ref 3.5–5.1)
Sodium: 135 mmol/L (ref 135–145)
Sodium: 136 mmol/L (ref 135–145)

## 2020-05-03 LAB — GLUCOSE, CAPILLARY
Glucose-Capillary: 203 mg/dL — ABNORMAL HIGH (ref 70–99)
Glucose-Capillary: 247 mg/dL — ABNORMAL HIGH (ref 70–99)
Glucose-Capillary: 196 mg/dL — ABNORMAL HIGH (ref 70–99)
Glucose-Capillary: 169 mg/dL — ABNORMAL HIGH (ref 70–99)
Glucose-Capillary: 187 mg/dL — ABNORMAL HIGH (ref 70–99)
Glucose-Capillary: 230 mg/dL — ABNORMAL HIGH (ref 70–99)

## 2020-05-03 LAB — CBC
HCT: 32.5 % — ABNORMAL LOW (ref 39.0–52.0)
Hemoglobin: 10 g/dL — ABNORMAL LOW (ref 13.0–17.0)
MCH: 27.2 pg (ref 26.0–34.0)
MCHC: 30.8 g/dL (ref 30.0–36.0)
MCV: 88.3 fL (ref 80.0–100.0)
Platelets: 125 10*3/uL — ABNORMAL LOW (ref 150–400)
RBC: 3.68 MIL/uL — ABNORMAL LOW (ref 4.22–5.81)
RDW: 17.9 % — ABNORMAL HIGH (ref 11.5–15.5)
WBC: 3.9 10*3/uL — ABNORMAL LOW (ref 4.0–10.5)
nRBC: 0 % (ref 0.0–0.2)

## 2020-05-03 LAB — HEPATIC FUNCTION PANEL
ALT: 186 U/L — ABNORMAL HIGH (ref 0–44)
AST: 132 U/L — ABNORMAL HIGH (ref 15–41)
Albumin: 2.3 g/dL — ABNORMAL LOW (ref 3.5–5.0)
Alkaline Phosphatase: 323 U/L — ABNORMAL HIGH (ref 38–126)
Bilirubin, Direct: 0.3 mg/dL — ABNORMAL HIGH (ref 0.0–0.2)
Indirect Bilirubin: 0.3 mg/dL (ref 0.3–0.9)
Total Bilirubin: 0.6 mg/dL (ref 0.3–1.2)
Total Protein: 6.6 g/dL (ref 6.5–8.1)

## 2020-05-03 LAB — PHOSPHORUS: Phosphorus: 4.8 mg/dL — ABNORMAL HIGH (ref 2.5–4.6)

## 2020-05-03 LAB — MAGNESIUM: Magnesium: 2.4 mg/dL (ref 1.7–2.4)

## 2020-05-03 MED ORDER — INSULIN DETEMIR 100 UNIT/ML ~~LOC~~ SOLN
25.0000 [IU] | Freq: Two times a day (BID) | SUBCUTANEOUS | Status: DC
  Administered 2020-05-03 – 2020-05-04 (×4): 25 [IU] via SUBCUTANEOUS
  Filled 2020-05-03 (×6): qty 0.25

## 2020-05-03 MED ORDER — LEVETIRACETAM IN NACL 500 MG/100ML IV SOLN
500.0000 mg | Freq: Once | INTRAVENOUS | Status: AC
  Administered 2020-05-03: 500 mg via INTRAVENOUS
  Filled 2020-05-03: qty 100

## 2020-05-03 MED ORDER — INSULIN ASPART 100 UNIT/ML ~~LOC~~ SOLN
4.0000 [IU] | SUBCUTANEOUS | Status: DC
  Administered 2020-05-03 – 2020-05-04 (×6): 4 [IU] via SUBCUTANEOUS

## 2020-05-03 NOTE — Plan of Care (Signed)
Signed out by Dr. [name] to review EEG as the patient's sedation was weaned off to look for any evidence of seizures. Continued independent left and right hemispheric epileptogenicity, without any seizure activity. Formal read in the morning.

## 2020-05-03 NOTE — Progress Notes (Signed)
Subjective: No clinical seizure-like activity overnight.  Received dialysis this morning.  Has been off Versed since yesterday around 11 AM.  ROS: Unable to obtain due to poor mental status  Examination  Vital signs in last 24 hours: Temp:  [97.7 F (36.5 C)-100.2 F (37.9 C)] 97.7 F (36.5 C) (06/07 1100) Pulse Rate:  [78-106] 98 (06/07 1200) Resp:  [16-35] 33 (06/07 1200) BP: (99-188)/(60-92) 142/75 (06/07 1200) SpO2:  [96 %-100 %] 96 % (06/07 1200) FiO2 (%):  [30 %-40 %] 30 % (06/07 1200) Weight:  [73.5 kg-77.5 kg] 73.5 kg (06/07 1100)  General: lying in bed, not in apparent distress CVS: pulse-normal rate and rhythm RS: breathing comfortably, intubated, on pressor support Extremities: normal, warm  Neuro: MS: Comatose, does not open eyes to noxious stimuli  CN: Pupils myotic and difficult to appreciate reactivity, corneal reflex intact, gag reflex intact  Motor: Withdraws to noxious stimuli with 1/5 strength in left lower extremity, does not withdraw to noxious stimuli in all other extremities.  Basic Metabolic Panel: Recent Labs  Lab 04/28/20 1835 04/28/20 2006 05/01/20 0308 05/01/20 0308 05/01/20 4098 05/01/20 1191 05/01/20 1714 05/01/20 1714 05/02/20 0419 05/02/20 0751 05/02/20 1810 05/03/20 0507 05/03/20 0735  NA 135   < > 135   < > 136  --  136  --   --  137 136  --  135  K 5.4*   < > 4.5   < > 4.8  --  4.1  --   --  4.7 4.5  --  4.4  CL 94*   < > 93*   < > 95*  --  97*  --   --  96* 96*  --  94*  CO2 24   < > 25   < > 24  --  27  --   --  24 25  --  27  GLUCOSE 280*   < > 253*   < > 191*  --  177*  --   --  119* 262*  --  244*  BUN 68*   < > 68*   < > 71*  --  32*  --   --  52* 36*  --  55*  CREATININE 8.97*   < > 6.23*   < > 6.29*  --  3.42*  --   --  4.32* 3.05*  --  3.67*  CALCIUM 8.9   < > 8.8*   < > 8.8*   < > 8.9   < >  --  9.2 9.1  --  9.7  MG 2.0  --  2.0  --   --   --   --   --  2.0  --   --  2.4  --   PHOS  --    < >  --   --  5.3*   < > 4.2   --   --  4.9* 3.7 4.8* 4.2   < > = values in this interval not displayed.    CBC: Recent Labs  Lab 05/01/2020 2024 05/21/2020 2124 04/29/20 0153 04/30/20 0434 05/01/20 0308 05/02/20 0419 05/03/20 0507  WBC 5.3   < > 7.4 4.9 4.2 5.2 3.9*  NEUTROABS 4.3  --   --   --   --   --   --   HGB 14.3   < > 10.6* 9.4* 9.2* 10.2* 10.0*  HCT 45.1   < > 34.1* 30.3* 30.1* 33.1* 32.5*  MCV 86.2   < >  88.6 89.6 89.9 90.4 88.3  PLT 142*   < > 103* 93* 90* 115* 125*   < > = values in this interval not displayed.     Coagulation Studies: No results for input(s): LABPROT, INR in the last 72 hours.  Imaging No new brain imaging overnight.  ASSESSMENT AND PLAN: 64 year old male with end-stage renal disease on hemodialysis who was admitted on 6/1 after presenting with hyperkalemia, acute hypoxic failure and pulmonary edema from volume overload in the setting of missed hemodialysis x2.  EEG was obtained as part of encephalopathy work-up and showed 7 seizures without clinical signs arising from left hemisphere.  Patient was started on long-term EEG and had multiple seizures (average 6 to 8/hr) arising from left and right hemisphere.  Keppra dose was increased and Depakote was added.  However seizures persisted and therefore patient was started on Versed and eventually propofol was added.    Patient was then started on Vimpat and finally Onfi was added after which seizures stopped.  Patient was gradually weaned off of propofol followed by Versed.  Last dose of Versed on 05/02/2020 at around 7 AM.   Nonconvulsive seizures, left and right hemisphere (resolved) Epilepsy, left and right hemisphere Acute encephalopathy, secondary to seizures and medications End-stage renal disease on hemodialysis Acute respiratory failure due to pulmonary edema Hypoalbuminemia Transaminitis Leukopenia Microcytic anemia Thrombocytopenia -EEG is improved overnight with no definite seizures however continues to show generalized  independent left and right epileptiform discharges. -Etiology for seizures: Patient's Keppra was stopped, possible toxicity from missing hemodialysis   Recommendations -Continue valproic acid 400 mg every 8 hours (will check level tomorrow morning), Keppra 1000 mg twice daily with an additional 500 mg dose after hemodialysis (renally adjusted therefore cannot increase dose any further), Vimpat 100 mg twice daily and Onfi 5 mg daily -Discussed with patient sister at bedside that patient has been off sedation since yesterday.  We will continue to do serial neurologic exams for neuro prognostication.  Usually by 48 to 72 hours after sedation has been turned off, if patient does not show any improvement, then chances of neurologic recovery are lower. Patient sister expressed understanding. -Management of rest of comorbidities per primary team.   CRITICAL CARE Performed by: Lora Havens   Total critical care time: 35 minutes  Critical care time was exclusive of separately billable procedures and treating other patients.  Critical care was necessary to treat or prevent imminent or life-threatening deterioration.  Critical care was time spent personally by me on the following activities: development of treatment plan with patient and/or surrogate as well as nursing, discussions with consultants, evaluation of patient's response to treatment, examination of patient, obtaining history from patient or surrogate, ordering and performing treatments and interventions, ordering and review of laboratory studies, ordering and review of radiographic studies, pulse oximetry and re-evaluation of patient's condition.    Zeb Comfort Epilepsy [facility] For questions after 5pm please refer to AMION to reach the Neurologist on call

## 2020-05-03 NOTE — Progress Notes (Addendum)
NAME:  Moua, Rasmusson MRN:  [id number], DOB:  [birth year], LOS: 15 ADMISSION DATE:  05/24/2020, CONSULTATION DATE: 6/1 REFERRING MD: Dr. Tyrone Nine EDP, CHIEF COMPLAINT: Unresponsiveness  Brief History   64 year old male with ESRD on HD unresponsive from home.  Intubated in ED.  Has missed 2 dialysis sessions.  Hyperkalemic and volume overloaded in the ED.  Admitted to ICU for emergent hemodialysis.  Past Medical History   has a past medical history of Anemia, Cancer ([place]), Chronic kidney disease, Diabetes mellitus without complication ([place]), GERD (gastroesophageal reflux disease), Hypertension, Seizures ([place]), Stroke ([place]), and Thyroid disease.  Significant Hospital Events   6/1 admit for emergent dialysis. Intubated 6/2 Seizures 6/4 remains in status epilepticus, shock due to heavy sedation.  6/5 Bps improved, starting to wean sedation 6/6 sedation stopped  Consults:  Nephrology Neurology  Procedures:  ETT 6/1> R Fem CVC 6/3>6/7  Significant Diagnostic Tests:  CT head 6/1 > chronic microvascular ischemic changes, volume loss. No acute intracranial abnormalities CXR 6/2> improved pulmonary edema  MRI Brain 6/2> no acute intracranial abnormality, no findings suggestive of PRES.  CXR 6/3> lower lung volumes. 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 Bowel loops visible.    Micro Data:  All neg  Antimicrobials:  6/4>>6/7 rocephin  Interim history/subjective:  Off sedation. Getting HD this AM. No seizures on overnight EEG per initial read, final pending.  Objective   Blood pressure (!) 143/74, pulse 93, temperature 98.6 F (37 C), temperature source Oral, resp. rate (!) 28, height 5\' 8"  (1.727 m), weight 77.5 kg, SpO2 99 %.    Vent Mode: PSV;CPAP FiO2 (%):  [30 %-40 %] 30 % Set Rate:  [18 bmp] 18 bmp Vt Set:  [550 mL] 550 mL PEEP:  [5 cmH20-8 cmH20] 5 cmH20 Pressure Support:  [12 cmH20] 12 cmH20 Plateau Pressure:  [21 cmH20-23 cmH20] 22 cmH20    Intake/Output Summary (Last 24 hours) at 05/03/2020 0748 Last data filed at 05/03/2020 0600 Gross per 24 hour  Intake 1782.68 ml  Output 4000 ml  Net -2217.32 ml   Filed Weights   05/02/20 0900 05/02/20 1215 05/03/20 0410  Weight: 83.1 kg 79.4 kg 77.5 kg    Examination: GEN: Sedated ill-appearing man on vent HEENT: Endotracheal tube in place with scant mucoid secretions CV: Heart sounds are regular, extremities are warm PULM: There are rhonchi bilaterally, he is passive on ventilator GI: Soft, +BS, soft EXT: He has mild diffuse anasarca, he has a left upper extremity fistula NEURO: remains comatose not triggering vent, pupils are more brisk today, cannot get to withdraw to pain PSYCH: Cannot assess SKIN: No rashes  LFTs downtrending WBC a little low H/H, plts stable No new imaging  Central vascular congestion and bilateral airspace disease on chest x-ray 05/01/20  Resolved Hospital Problem list     Assessment & Plan:   Status epilepticus-MRI without evidence of PRES.  Has hx of seizures in past.   VEEG monitoring ongoing. - AEDs and sedation titration per neurology, appreciate help  Comatose state- related to sedation, going to take a while to wake up given doses of sedation he required for seizure suppression  Acute hypoxic respiratory failure- due to inability to handle secretions, aspiration, volume overload - fluid removal by HD - cultures unremarkable to date, fevers resolved - VAP prevention bundle - current sedation level precludes extbuation  Aspiration pneumonitis vs. Pneumonia- with fevers, leukocytosis, MRSA swab neg, tracheal aspirate neg - Short course of ceftriaxone reasonable, end  date placed for 6/7  ESRD on HD  - Continue iHD, negative as possible from my standpoint  Hypotension related to sedation- resolved  DM, poorly controlled -A1c 9.6, worse 6/7 - increase levemir, start TF aspart coverage, monitor SSI requirements  Pancytopenia-  normocytic, stable, question if related to cancer vs. Meds  Hx prostate cancer-followed by [facility] , do not have nubeqa on formulary  Loose Stools-present on admission, C diff neg -BMS  Best practice:  Diet: TF Pain/Anxiety/Delirium protocol (if indicated): per neurology VAP protocol (if indicated): yes DVT prophylaxis: SQH GI prophylaxis: PPI Glucose control: SSI  + levemir  Mobility: BR Code Status: FULL  Family Communication: updated at bedside 6/6 Disposition: ICU    The patient is critically ill with multiple organ systems failure and requires high complexity decision making for assessment and support, frequent evaluation and titration of therapies, application of advanced monitoring technologies and extensive interpretation of multiple databases. Critical Care Time devoted to patient care services described in this note independent of APP/resident time (if applicable)  is 36 minutes.   Erskine Emery MD [facility] 05/03/2020 7:48 AM Personal pager: [phone] If unanswered, please page CCM On-call: [phone]

## 2020-05-03 NOTE — Progress Notes (Signed)
LTM EEG discontinued - Skin breakdown at F8,F7 RN notified

## 2020-05-03 NOTE — Procedures (Addendum)
Patient Name:Jared [name]. XYI:[id number] Epilepsy Attending:Macaela Presas Barbra Sarks Referring Physician/Provider:Grace Bowser, NP Duration:05/02/2020 1113 to 6/7/20211222  Patient history:64 year old male with history of seizures on Keppra was noted to have further seizure-like activity. EEG evaluate for seizures.  Level of alertness:comatose  AEDs during EEG study:Keppra,VPA,vimpat, onfi  Technical aspects: This EEG study was done with scalp electrodes positioned according to the 10-20 International system of electrode placement. Electrical activity was acquired at a sampling rate of 500Hz  and reviewed with a high frequency filter of 70Hz  and a low frequency filter of 1Hz . EEG data were recorded continuously and digitally stored.   Description:EEG showed continuous generalized polymorphic sharply contoured 3-6hz  theta-delta slowing admixed with rare left and right hemispheric independent spikes and polyspikes. Hyperventilation and photic stimulation were not performed.   ABNORMALITY - Spikes, left and right hemisphere -Continuous slow, generalized  IMPRESSION: This studyshowed evidence of independent left and right hemisphere epileptigenicity as well as suggestive ofsevere diffuse encephalopathy, nonspecific etiology.No definite seizures were seen during this study.   EEG appears to be improving compared to previous day.   Kathy Wares Barbra Sarks

## 2020-05-03 NOTE — Progress Notes (Signed)
[facility] Progress Note  Subjective: pt on vent , off sedation, no seizures overnight; on HD this AM.   Vitals:   05/03/20 0900 05/03/20 0915 05/03/20 0930 05/03/20 0945  BP: 115/64 106/62 112/65 104/60  Pulse: 92 92 93 93  Resp: (!) 31 (!) 33 (!) 33 (!) 34  Temp:      TempSrc:      SpO2: 97% 98% 98% 97%  Weight:      Height:        Exam:   On vent and sedated  no jvd  Chest cta bilat  Cor reg no RG  Abd soft ntnd no ascites   Ext +2 UE> trace LE edema   Sedated , not responsive    LUA aVF+bruit Qb 400, no issues per RN    OP HD: TTS NW  4h  400/800  2/2 bath 72.5kg  Hep 2300   L AVF   - hect 7 ug  Assessment/ Plan: 1. Seizures - non-convulsive seizures per EEG, now off sedation, on keppra. Per neuro/ pmd. MRI negative, no evidence of PRES.  2. Fever/ pulm infiltrates - possible PNA improving. Getting ceftriaxone per short course. 3. AMS: admit creat 13.12, poss some element of uremia on admit. Creat down now w/ HD. 4. Acute resp failure/ vol overload/ pulm edema - 6/5 CXR improved without edema, cont UF with HD 5. ESRD - on HD TTS. HD Sat 3L, yest 4L and today, plan 3-4L as tolerated today.  Will need to resume outpt TTS schedule prior to discharge, but after 3 consecutive days of dialysis I will hold tomorrow and plan next on Wednesday.  6. Secondary hyperPTH: cont outpt hectorol dose   Jared Hick MD [facility] Pager [phone]   Recent Labs  Lab 05/02/20 [phone] 05/02/20 0751 05/02/20 1810 05/02/20 1810 05/03/20 0507 05/03/20 0735  K  --    < > 4.5  --   --  4.4  BUN  --    < > 36*  --   --  55*  CREATININE  --    < > 3.05*  --   --  3.67*  CALCIUM  --    < > 9.1  --   --  9.7  PHOS  --    < > 3.7   < > 4.8* 4.2  HGB 10.2*  --   --   --  10.0*  --    < > = values in this interval not displayed.   Inpatient medications: . amLODipine  5 mg Per Tube Daily  . carvedilol  12.5 mg Per Tube BID WC  . chlorhexidine gluconate  (MEDLINE KIT)  15 mL Mouth Rinse BID  . Chlorhexidine Gluconate Cloth  6 each Topical Q0600  . Chlorhexidine Gluconate Cloth  6 each Topical Q0600  . cloBAZam  5 mg Per Tube Daily  . docusate  100 mg Per Tube BID  . feeding supplement (PRO-STAT SUGAR FREE 64)  30 mL Per Tube TID  . heparin  5,000 Units Subcutaneous Q8H  . insulin aspart  0-15 Units Subcutaneous Q4H  . insulin aspart  4 Units Subcutaneous Q4H  . insulin detemir  25 Units Subcutaneous BID  . mouth rinse  15 mL Mouth Rinse 10 times per day  . pantoprazole sodium  40 mg Per Tube QHS  . valproic acid  400 mg Per Tube TID   . sodium chloride Stopped (05/02/20 1759)  . cefTRIAXone (ROCEPHIN)  IV  Stopped (05/02/20 1322)  . feeding supplement (VITAL 1.5 CAL) 50 mL/hr at 05/03/20 0600  . lacosamide (VIMPAT) IV 100 mg (05/02/20 2234)  . levETIRAcetam 1,000 mg (05/02/20 2116)  . levETIRAcetam Stopped (05/01/20 1211)  . midazolam Stopped (05/02/20 1150)  . phenylephrine (NEO-SYNEPHRINE) Adult infusion     acetaminophen (TYLENOL) oral liquid 160 mg/5 mL, dextrose, docusate sodium, fentaNYL (SUBLIMAZE) injection, fentaNYL (SUBLIMAZE) injection, hydrALAZINE, polyethylene glycol

## 2020-05-04 DIAGNOSIS — N1831 Chronic kidney disease, stage 3a: Secondary | ICD-10-CM

## 2020-05-04 DIAGNOSIS — N170 Acute kidney failure with tubular necrosis: Secondary | ICD-10-CM

## 2020-05-04 LAB — HEPATIC FUNCTION PANEL
ALT: 196 U/L — ABNORMAL HIGH (ref 0–44)
AST: 145 U/L — ABNORMAL HIGH (ref 15–41)
Albumin: 2.4 g/dL — ABNORMAL LOW (ref 3.5–5.0)
Alkaline Phosphatase: 427 U/L — ABNORMAL HIGH (ref 38–126)
Bilirubin, Direct: 0.2 mg/dL (ref 0.0–0.2)
Indirect Bilirubin: 0.2 mg/dL — ABNORMAL LOW (ref 0.3–0.9)
Total Bilirubin: 0.4 mg/dL (ref 0.3–1.2)
Total Protein: 7.4 g/dL (ref 6.5–8.1)

## 2020-05-04 LAB — CULTURE, BLOOD (ROUTINE X 2)
Culture: NO GROWTH
Culture: NO GROWTH
Special Requests: ADEQUATE

## 2020-05-04 LAB — RENAL FUNCTION PANEL
Albumin: 2.5 g/dL — ABNORMAL LOW (ref 3.5–5.0)
Albumin: 2.3 g/dL — ABNORMAL LOW (ref 3.5–5.0)
Anion gap: 16 — ABNORMAL HIGH (ref 5–15)
Anion gap: 16 — ABNORMAL HIGH (ref 5–15)
BUN: 77 mg/dL — ABNORMAL HIGH (ref 8–23)
BUN: 97 mg/dL — ABNORMAL HIGH (ref 8–23)
CO2: 26 mmol/L (ref 22–32)
CO2: 23 mmol/L (ref 22–32)
Calcium: 10.2 mg/dL (ref 8.9–10.3)
Calcium: 10.2 mg/dL (ref 8.9–10.3)
Chloride: 95 mmol/L — ABNORMAL LOW (ref 98–111)
Chloride: 98 mmol/L (ref 98–111)
Creatinine, Ser: 4.05 mg/dL — ABNORMAL HIGH (ref 0.61–1.24)
Creatinine, Ser: 4.79 mg/dL — ABNORMAL HIGH (ref 0.61–1.24)
GFR calc Af Amer: 17 mL/min — ABNORMAL LOW (ref >60)
GFR calc Af Amer: 14 mL/min — ABNORMAL LOW (ref >60)
GFR calc non Af Amer: 14 mL/min — ABNORMAL LOW (ref >60)
GFR calc non Af Amer: 12 mL/min — ABNORMAL LOW (ref >60)
Glucose, Bld: 117 mg/dL — ABNORMAL HIGH (ref 70–99)
Glucose, Bld: 182 mg/dL — ABNORMAL HIGH (ref 70–99)
Phosphorus: 4.7 mg/dL — ABNORMAL HIGH (ref 2.5–4.6)
Phosphorus: 5 mg/dL — ABNORMAL HIGH (ref 2.5–4.6)
Potassium: 5.5 mmol/L — ABNORMAL HIGH (ref 3.5–5.1)
Potassium: 5.5 mmol/L — ABNORMAL HIGH (ref 3.5–5.1)
Sodium: 137 mmol/L (ref 135–145)
Sodium: 137 mmol/L (ref 135–145)

## 2020-05-04 LAB — CBC
HCT: 35.6 % — ABNORMAL LOW (ref 39.0–52.0)
Hemoglobin: 11.2 g/dL — ABNORMAL LOW (ref 13.0–17.0)
MCH: 27.1 pg (ref 26.0–34.0)
MCHC: 31.5 g/dL (ref 30.0–36.0)
MCV: 86 fL (ref 80.0–100.0)
Platelets: 166 10*3/uL (ref 150–400)
RBC: 4.14 MIL/uL — ABNORMAL LOW (ref 4.22–5.81)
RDW: 18.1 % — ABNORMAL HIGH (ref 11.5–15.5)
WBC: 5.2 10*3/uL (ref 4.0–10.5)
nRBC: 0 % (ref 0.0–0.2)

## 2020-05-04 LAB — GLUCOSE, CAPILLARY
Glucose-Capillary: 90 mg/dL (ref 70–99)
Glucose-Capillary: 106 mg/dL — ABNORMAL HIGH (ref 70–99)
Glucose-Capillary: 170 mg/dL — ABNORMAL HIGH (ref 70–99)
Glucose-Capillary: 153 mg/dL — ABNORMAL HIGH (ref 70–99)
Glucose-Capillary: 186 mg/dL — ABNORMAL HIGH (ref 70–99)
Glucose-Capillary: 189 mg/dL — ABNORMAL HIGH (ref 70–99)

## 2020-05-04 LAB — MAGNESIUM: Magnesium: 2.3 mg/dL (ref 1.7–2.4)

## 2020-05-04 LAB — VALPROIC ACID LEVEL: Valproic Acid Lvl: 36 ug/mL — ABNORMAL LOW (ref 50.0–100.0)

## 2020-05-04 LAB — PHOSPHORUS: Phosphorus: 4.6 mg/dL (ref 2.5–4.6)

## 2020-05-04 MED ORDER — CARVEDILOL 12.5 MG PO TABS
12.5000 mg | ORAL_TABLET | Freq: Once | ORAL | Status: AC
  Administered 2020-05-04: 12.5 mg
  Filled 2020-05-04: qty 1

## 2020-05-04 MED ORDER — CARVEDILOL 25 MG PO TABS
25.0000 mg | ORAL_TABLET | Freq: Two times a day (BID) | ORAL | Status: DC
  Administered 2020-05-04 – 2020-05-10 (×13): 25 mg
  Filled 2020-05-04 (×13): qty 1

## 2020-05-04 NOTE — Progress Notes (Signed)
Subjective: No acute events overnight.  No clinical seizure-like activity.  No family at bedside today.  ROS: Unable to obtain due to poor mental status  Examination  Vital signs in last 24 hours: Temp:  [97.8 F (36.6 C)-100.1 F (37.8 C)] 99.6 F (37.6 C) (06/08 1100) Pulse Rate:  [91-105] 100 (06/08 1150) Resp:  [9-40] 40 (06/08 1150) BP: (126-178)/(67-95) 146/83 (06/08 1150) SpO2:  [95 %-100 %] 96 % (06/08 1150) FiO2 (%):  [30 %] 30 % (06/08 1150) Weight:  [74.6 kg] 74.6 kg (06/08 0334)  General: lying in bed, not in apparent distress CVS: pulse-normal rate and rhythm RS: breathing comfortably, intubated, on pressor support Extremities: normal, warm  Neuro: MS: Comatose, does not open eyes to noxious stimuli  CN: Pupils reacting to light, corneal reflex intact, gag reflex intact  Motor: Withdraws to noxious stimuli with 1/5 strength in bilateral lower extremity, does not withdraw to noxious stimuli in bilateral upper extremity  Basic Metabolic Panel: Recent Labs  Lab 04/28/20 1835 04/28/20 2006 05/01/20 0308 05/01/20 8119 05/01/20 1714 05/02/20 0419 05/02/20 0751 05/02/20 0751 05/02/20 1810 05/02/20 1810 05/03/20 0507 05/03/20 0735 05/03/20 1818 05/04/20 0253 05/04/20 0833  NA 135   < > 135   < >   < >  --  137  --  136  --   --  135 136  --  137  K 5.4*   < > 4.5   < >   < >  --  4.7  --  4.5  --   --  4.4 5.0  --  5.5*  CL 94*   < > 93*   < >   < >  --  96*  --  96*  --   --  94* 95*  --  95*  CO2 24   < > 25   < >   < >  --  24  --  25  --   --  27 26  --  26  GLUCOSE 280*   < > 253*   < >   < >  --  119*  --  262*  --   --  244* 184*  --  117*  BUN 68*   < > 68*   < >   < >  --  52*  --  36*  --   --  55* 49*  --  77*  CREATININE 8.97*   < > 6.23*   < >   < >  --  4.32*  --  3.05*  --   --  3.67* 3.03*  --  4.05*  CALCIUM 8.9   < > 8.8*   < >   < >  --  9.2   < > 9.1   < >  --  9.7 10.3  --  10.2  MG 2.0  --  2.0  --   --  2.0  --   --   --   --  2.4  --    --  2.3  --   PHOS  --    < >  --    < >   < >  --  4.9*   < > 3.7   < > 4.8* 4.2 4.1 4.6 4.7*   < > = values in this interval not displayed.    CBC: Recent Labs  Lab 05/25/2020 2024 05/24/2020 2124 04/30/20 0434 05/01/20 0308 05/02/20 0419 05/03/20 0507 05/04/20  0253  WBC 5.3   < > 4.9 4.2 5.2 3.9* 5.2  NEUTROABS 4.3  --   --   --   --   --   --   HGB 14.3   < > 9.4* 9.2* 10.2* 10.0* 11.2*  HCT 45.1   < > 30.3* 30.1* 33.1* 32.5* 35.6*  MCV 86.2   < > 89.6 89.9 90.4 88.3 86.0  PLT 142*   < > 93* 90* 115* 125* 166   < > = values in this interval not displayed.     Coagulation Studies: No results for input(s): LABPROT, INR in the last 72 hours.  Imaging No new brain imaging overnight  ASSESSMENT AND PLAN: 64 year old male with end-stage renal disease on hemodialysis who was admitted on 6/1 after presenting with hyperkalemia, acute hypoxic failure and pulmonary edema from volume overload in the setting of missed hemodialysis x2. EEG was obtained as part of encephalopathy work-up and showed 7 seizures without clinical signs arising from left hemisphere. Patient was started on long-term EEG and had multiple seizures (average 6 to 8/hr) arising from left and right hemisphere. Keppra dose was increased and Depakote was added. However seizures persisted and therefore patient was started on Versed and eventually propofol was added.   Patient was then started on Vimpat and finally Onfi was added after which seizures stopped.  Patient was gradually weaned off of propofol followed by Versed.  Last dose of Versed on 05/02/2020 at around 7 AM.  Nonconvulsive seizures, left and right hemisphere (resolved) Epilepsy, left and right hemisphere End-stage renal disease on hemodialysis -Etiology for seizures: Patient's Keppra was stopped, possible toxicity from missing hemodialysis  Recommendations -Continue valproic acid at 4 mg 3 times daily.  Level was subtherapeutic at 36.  Therefore can  increase if patient has any clinical seizures. -Continue Keppra 1000 mg twice daily with an additional 500 mg dose after hemodialysis (renally adjusted therefore cannot increase dose any further), Vimpat 100 mg twice daily and Onfi 5 mg daily - Patient continues to be comatose however does have cranial nerve reflexes and withdraws to noxious stimuli in bilateral lower extremities today.  Given his end-stage renal disease and prolonged seizures, patient may need longer period to recover.  We will continue to do serial neurologic exams for neuro prognostication.    However if by end of the week patient continues to be comatose without significant improvement, then chances of neurologic recovery will likely be marginal. -Management of rest of comorbidities per primary team.   CRITICAL CARE Performed by: Lora Havens   Total critical care time:66minutes  Critical care time was exclusive of separately billable procedures and treating other patients.  Critical care was necessary to treat or prevent imminent or life-threatening deterioration.  Critical care was time spent personally by me on the following activities: development of treatment plan with patient and/or surrogate as well as nursing, discussions with consultants, evaluation of patient's response to treatment, examination of patient, obtaining history from patient or surrogate, ordering and performing treatments and interventions, ordering and review of laboratory studies, ordering and review of radiographic studies, pulse oximetry and re-evaluation of patient's condition.    Zeb Comfort Epilepsy [facility] For questions after 5pm please refer to AMION to reach the Neurologist on call

## 2020-05-04 NOTE — Progress Notes (Signed)
NAME:  Jared [name], Jared [name] MRN:  [id number], DOB:  [birth year], LOS: 7 ADMISSION DATE:  05/15/2020, CONSULTATION DATE: 6/1 REFERRING MD: Dr. Tyrone Nine EDP, CHIEF COMPLAINT: Unresponsiveness  Brief History   64 year old male with ESRD on HD unresponsive from home.  Intubated in ED.  Has missed 2 dialysis sessions.  Hyperkalemic and volume overloaded in the ED.  Admitted to ICU for emergent hemodialysis.  Past Medical History   has a past medical history of Anemia, Cancer ([place]), Chronic kidney disease, Diabetes mellitus without complication ([place]), GERD (gastroesophageal reflux disease), Hypertension, Seizures ([place]), Stroke ([place]), and Thyroid disease.  Significant Hospital Events   6/1 admit for emergent dialysis. Intubated 6/2 Seizures 6/4 remains in status epilepticus, shock due to heavy sedation.  6/5 Bps improved, starting to wean sedation 6/6 sedation stopped  Consults:  Nephrology Neurology  Procedures:  ETT 6/1> R Fem CVC 6/3>6/7  Significant Diagnostic Tests:  CT head 6/1 > chronic microvascular ischemic changes, volume loss. No acute intracranial abnormalities CXR 6/2> improved pulmonary edema  MRI Brain 6/2> no acute intracranial abnormality, no findings suggestive of PRES.  CXR 6/3> lower lung volumes. 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 Bowel loops visible.    Micro Data:  All neg  Antimicrobials:  6/4>>6/7 rocephin  Interim history/subjective:  Remains off sedation. LTVEEG dc'd  Objective   Blood pressure (!) 148/81, pulse 98, temperature 98.9 F (37.2 C), temperature source Oral, resp. rate 20, height 5\' 8"  (1.727 m), weight 74.6 kg, SpO2 97 %.    Vent Mode: PRVC FiO2 (%):  [30 %] 30 % Set Rate:  [18 bmp] 18 bmp Vt Set:  [550 mL] 550 mL PEEP:  [5 cmH20] 5 cmH20 Pressure Support:  [12 cmH20] 12 cmH20 Plateau Pressure:  [16 cmH20-21 cmH20] 21 cmH20   Intake/Output Summary (Last 24 hours) at 05/04/2020 0718 Last data filed at 05/04/2020  0600 Gross per 24 hour  Intake 1959.78 ml  Output 4050 ml  Net -2090.22 ml   Filed Weights   05/03/20 0710 05/03/20 1100 05/04/20 0334  Weight: 77.5 kg 73.5 kg 74.6 kg    Examination: GEN: Sedated ill-appearing man on vent HEENT: Endotracheal tube in place with small thick secretions CV: Heart sounds are regular, extremities are warm PULM: There are rhonchi bilaterally, he is passive on ventilator GI: Soft, +BS, soft EXT: He has mild diffuse anasarca, he has a left upper extremity fistula NEURO: remains comatose, pupils are more brisk today, cannot get to withdraw to pain, no corneals, no oculocephalic reflex, does trigger vent and has strong cough today PSYCH: Cannot assess SKIN: No rashes  LFTs downtrending WBC a little low H/H, plts stable No new imaging  Central vascular congestion and bilateral airspace disease on chest x-ray 05/01/20  Resolved Hospital Problem list    Aspiration pneumonitis vs. Pneumonia- with fevers, leukocytosis, MRSA swab neg, tracheal aspirate neg - Short course of ceftriaxone reasonable, end date placed for 6/7 Hypotension related to sedation- resolved  Assessment & Plan:   Status epilepticus-MRI without evidence of PRES.  Has hx of seizures in past.   VEEG monitoring off. - AEDs per neurology, appreciate help  Comatose state- related to sedation, going to take a while to wake up given doses of sedation he required for seizure suppression - Watch and wait.  Acute hypoxic respiratory failure- due to inability to handle secretions, aspiration, volume overload - fluid removal by HD - cultures unremarkable to date, fevers resolved - VAP prevention bundle -  current sedation level precludes extbuation  ESRD on HD  - Continue iHD, negative as possible from my standpoint  DM, poorly controlled -A1c 9.6, worse 6/7 - increase levemir, stop TF aspart coverage, monitor SSI requirements  Pancytopenia- normocytic, stable, question if related to  cancer vs. Meds  Hx prostate cancer-followed by [facility] , do not have nubeqa on formulary  Loose Stools-present on admission, C diff neg -BMS  Best practice:  Diet: TF Pain/Anxiety/Delirium protocol (if indicated): per neurology VAP protocol (if indicated): yes DVT prophylaxis: SQH GI prophylaxis: PPI Glucose control: SSI  + levemir  Mobility: BR Code Status: FULL  Family Communication: updated at bedside 6/6 Disposition: ICU    The patient is critically ill with multiple organ systems failure and requires high complexity decision making for assessment and support, frequent evaluation and titration of therapies, application of advanced monitoring technologies and extensive interpretation of multiple databases. Critical Care Time devoted to patient care services described in this note independent of APP/resident time (if applicable)  is 32 minutes.   Erskine Emery MD [facility] 05/04/2020 7:18 AM Personal pager: [phone] If unanswered, please page CCM On-call: [phone]

## 2020-05-04 NOTE — Progress Notes (Signed)
[facility] Progress Note  Subjective: pt on vent , off sedation now since 6/6 - slowly waking up Tolerated UF 4L with HD yesterday.    Vitals:   05/04/20 0700 05/04/20 0732 05/04/20 0800 05/04/20 0900  BP: (!) 150/75 (!) 150/95 (!) 153/82 (!) 146/83  Pulse: 99 98 (!) 102 (!) 102  Resp: (!) 22 (!) 35 (!) 35 (!) 34  Temp: 99.6 F (37.6 C)     TempSrc: Axillary     SpO2: 97% 98% 96% 96%  Weight:      Height:        Exam: Vent, no sedation, does not wake to voice or shoulder shake for me  no jvd  Chest cta bilat  Cor reg no RG  Abd soft ntnd no ascites   Ext +2 UE> trace LE edema   LUA aVF+bruit    OP HD: TTS NW  4h  400/800  2/2 bath 72.5kg  Hep 2300   L AVF   - hect 7 ug  Assessment/ Plan: 1. Seizures - non-convulsive seizures per EEG, now off sedation, on keppra. Per neuro/ pmd. MRI negative, no evidence of PRES.  2. Fever/ pulm infiltrates - possible PNA improving. S/p ceftriaxone per short course. 3. AMS: multifactorial, improving per chart as sedation clears; had seizures, poss element of uremia initially in setting of missed HD. 4. Acute resp failure/ vol overload/ pulm edema - 6/5 CXR improved without edema, cont UF with HD 5. ESRD - on HD TTS, had HD here Sat, Sun, Mon for volume.  Holding today, next tomorrow with UF goal 4L again.  Will resume outpt TTS schedule sometime prior to discharge.  6. Secondary hyperPTH: cont outpt hectorol dose   Jannifer Hick MD [facility] Pager [phone]   Recent Labs  Lab 05/03/20 0507 05/03/20 0735 05/03/20 1818 05/03/20 1818 05/04/20 0253 05/04/20 0833  K  --    < > 5.0  --   --  5.5*  BUN  --    < > 49*  --   --  77*  CREATININE  --    < > 3.03*  --   --  4.05*  CALCIUM  --    < > 10.3  --   --  10.2  PHOS 4.8*   < > 4.1   < > 4.6 4.7*  HGB 10.0*  --   --   --  11.2*  --    < > = values in this interval not displayed.   Inpatient medications: . amLODipine  5 mg Per Tube Daily  .  carvedilol  12.5 mg Per Tube BID WC  . chlorhexidine gluconate (MEDLINE KIT)  15 mL Mouth Rinse BID  . Chlorhexidine Gluconate Cloth  6 each Topical Q0600  . cloBAZam  5 mg Per Tube Daily  . docusate  100 mg Per Tube BID  . feeding supplement (PRO-STAT SUGAR FREE 64)  30 mL Per Tube TID  . heparin  5,000 Units Subcutaneous Q8H  . insulin aspart  0-15 Units Subcutaneous Q4H  . insulin detemir  25 Units Subcutaneous BID  . mouth rinse  15 mL Mouth Rinse 10 times per day  . pantoprazole sodium  40 mg Per Tube QHS  . valproic acid  400 mg Per Tube TID   . sodium chloride Stopped (05/04/20 0207)  . feeding supplement (VITAL 1.5 CAL) 1,000 mL (05/04/20 0937)  . lacosamide (VIMPAT) IV Stopped (05/03/20 2220)  . levETIRAcetam Stopped (05/03/20  2138)  . levETIRAcetam Stopped (05/01/20 1211)  . midazolam Stopped (05/02/20 1150)  . phenylephrine (NEO-SYNEPHRINE) Adult infusion     acetaminophen (TYLENOL) oral liquid 160 mg/5 mL, dextrose, docusate sodium, fentaNYL (SUBLIMAZE) injection, fentaNYL (SUBLIMAZE) injection, hydrALAZINE, polyethylene glycol

## 2020-05-04 NOTE — Consult Note (Signed)
Responded to referral from day chaplain, conferred with nurse, prayed bedside for pt, as a family member had requested. Staff will call if there is further need for chaplain services.   Rev. Eloise Levels Chaplain

## 2020-05-04 NOTE — Progress Notes (Signed)
Nutrition Follow-up  DOCUMENTATION CODES:   Not applicable  INTERVENTION:   Continue tube feeds via OG tube: - Vital 1.5 @ 50 ml/hr (1200 ml/day) - Pro-stat 30 ml TID  Tube feeding regimen provides 2100 kcal, 126 grams of protein, and 917 ml of H2O.   NUTRITION DIAGNOSIS:   Inadequate oral intake related to inability to eat as evidenced by NPO status.  Ongoing  GOAL:   Patient will meet greater than or equal to 90% of their needs  Met via TF  MONITOR:   Vent status, TF tolerance, Labs  REASON FOR ASSESSMENT:   Ventilator, Consult Enteral/tube feeding initiation and management  ASSESSMENT:   64 yo male admitted with hyperkalemia, volume overload, acute metabolic encephalopathy after missing two dialysis sessions. Required intubation and emergent HD on admission. PMH includes HTN, thyroid disease, DM, stroke, ESRD on HD, hypoglycemic seizure, prostate cancer, GERD.  6/02 - developed seizures 6/06 - sedation stopped  Discussed pt with RN and during ICU rounds.  Pt with 3 consecutive days of iHD. Per Nephrology, plans to hold today and receive next treatment tomorrow. Per CCM, pt may take a while to wake up given doses of sedation he required for seizure suppression.  OG tube remains in place with TF infusing. Current weight is 2 kg above admit weight. EDW: 72.5 kg per Nephrology.  Current TF: Vital 1.5 @ 50 ml/hr, Pro-stat 30 ml TID  Patient is remains intubated on ventilator support MV: 13.4 L/min Temp (24hrs), Avg:99.1 F (37.3 C), Min:97.8 F (36.6 C), Max:100.1 F (37.8 C) BP (cuff): 146/83 MAP (cuff): 102  Medications reviewed and include: colace, SSI q 4 hours, Levemir 25 units daily, protonix  Labs reviewed: elevated LFTs, K+ and phos WNL CBG's: 90-230 x 24 hours  HD net UF 6/07: 4000 ml I/O's: +1.2 L since admit  Diet Order:   Diet Order            Diet NPO time specified  Diet effective now              EDUCATION NEEDS:   Not  appropriate for education at this time  Skin:  Skin Assessment: Reviewed RN Assessment  Last BM:  05/04/20 rectal tube  Height:   Ht Readings from Last 1 Encounters:  05/05/2020 5' 8" (1.727 m)    Weight:   Wt Readings from Last 1 Encounters:  05/04/20 74.6 kg   EDW: 72.5 kg  Ideal Body Weight:  70 kg  BMI:  Body mass index is 25.01 kg/m.  Estimated Nutritional Needs:   Kcal:  1989  Protein:  110-140 gm  Fluid:  UOP + 1 L    Kate Jablonski , MS, RD, LDN Inpatient Clinical Dietitian Pager: [phone] Weekend/After Hours: [phone]  

## 2020-05-05 ENCOUNTER — Inpatient Hospital Stay (HOSPITAL_COMMUNITY): Payer: Medicare Other

## 2020-05-05 DIAGNOSIS — R4182 Altered mental status, unspecified: Secondary | ICD-10-CM

## 2020-05-05 DIAGNOSIS — G40901 Epilepsy, unspecified, not intractable, with status epilepticus: Secondary | ICD-10-CM

## 2020-05-05 LAB — CBC
HCT: 32.8 % — ABNORMAL LOW (ref 39.0–52.0)
Hemoglobin: 10.3 g/dL — ABNORMAL LOW (ref 13.0–17.0)
MCH: 27 pg (ref 26.0–34.0)
MCHC: 31.4 g/dL (ref 30.0–36.0)
MCV: 86.1 fL (ref 80.0–100.0)
Platelets: 164 10*3/uL (ref 150–400)
RBC: 3.81 MIL/uL — ABNORMAL LOW (ref 4.22–5.81)
RDW: 18.4 % — ABNORMAL HIGH (ref 11.5–15.5)
WBC: 6.6 10*3/uL (ref 4.0–10.5)
nRBC: 0 % (ref 0.0–0.2)

## 2020-05-05 LAB — RENAL FUNCTION PANEL
Albumin: 2.6 g/dL — ABNORMAL LOW (ref 3.5–5.0)
Albumin: 2.5 g/dL — ABNORMAL LOW (ref 3.5–5.0)
Anion gap: 18 — ABNORMAL HIGH (ref 5–15)
Anion gap: 18 — ABNORMAL HIGH (ref 5–15)
BUN: 85 mg/dL — ABNORMAL HIGH (ref 8–23)
BUN: 75 mg/dL — ABNORMAL HIGH (ref 8–23)
CO2: 22 mmol/L (ref 22–32)
CO2: 22 mmol/L (ref 22–32)
Calcium: 9.8 mg/dL (ref 8.9–10.3)
Calcium: 9.8 mg/dL (ref 8.9–10.3)
Chloride: 95 mmol/L — ABNORMAL LOW (ref 98–111)
Chloride: 96 mmol/L — ABNORMAL LOW (ref 98–111)
Creatinine, Ser: 4.35 mg/dL — ABNORMAL HIGH (ref 0.61–1.24)
Creatinine, Ser: 3.97 mg/dL — ABNORMAL HIGH (ref 0.61–1.24)
GFR calc Af Amer: 15 mL/min — ABNORMAL LOW (ref >60)
GFR calc Af Amer: 17 mL/min — ABNORMAL LOW (ref >60)
GFR calc non Af Amer: 13 mL/min — ABNORMAL LOW (ref >60)
GFR calc non Af Amer: 15 mL/min — ABNORMAL LOW (ref >60)
Glucose, Bld: 176 mg/dL — ABNORMAL HIGH (ref 70–99)
Glucose, Bld: 295 mg/dL — ABNORMAL HIGH (ref 70–99)
Phosphorus: 4.3 mg/dL (ref 2.5–4.6)
Phosphorus: 5.6 mg/dL — ABNORMAL HIGH (ref 2.5–4.6)
Potassium: 4.9 mmol/L (ref 3.5–5.1)
Potassium: 5.1 mmol/L (ref 3.5–5.1)
Sodium: 135 mmol/L (ref 135–145)
Sodium: 136 mmol/L (ref 135–145)

## 2020-05-05 LAB — PHOSPHORUS: Phosphorus: 5.8 mg/dL — ABNORMAL HIGH (ref 2.5–4.6)

## 2020-05-05 LAB — GLUCOSE, CAPILLARY
Glucose-Capillary: 169 mg/dL — ABNORMAL HIGH (ref 70–99)
Glucose-Capillary: 162 mg/dL — ABNORMAL HIGH (ref 70–99)
Glucose-Capillary: 188 mg/dL — ABNORMAL HIGH (ref 70–99)
Glucose-Capillary: 223 mg/dL — ABNORMAL HIGH (ref 70–99)
Glucose-Capillary: 305 mg/dL — ABNORMAL HIGH (ref 70–99)
Glucose-Capillary: 137 mg/dL — ABNORMAL HIGH (ref 70–99)

## 2020-05-05 LAB — MAGNESIUM: Magnesium: 2.7 mg/dL — ABNORMAL HIGH (ref 1.7–2.4)

## 2020-05-05 MED ORDER — INSULIN DETEMIR 100 UNIT/ML ~~LOC~~ SOLN
28.0000 [IU] | Freq: Two times a day (BID) | SUBCUTANEOUS | Status: DC
  Administered 2020-05-05 – 2020-05-06 (×3): 28 [IU] via SUBCUTANEOUS
  Filled 2020-05-05 (×4): qty 0.28

## 2020-05-05 MED ORDER — HEPARIN SODIUM (PORCINE) 1000 UNIT/ML IJ SOLN
INTRAMUSCULAR | Status: AC
  Administered 2020-05-05: 2000 [IU]
  Filled 2020-05-05: qty 2

## 2020-05-05 NOTE — Progress Notes (Addendum)
Subjective: No acute events overnight.  ROS: unable to obtain due to poor mental status  Examination  Vital signs in last 24 hours: Temp:  [97.6 F (36.4 C)-100.1 F (37.8 C)] 97.6 F (36.4 C) (06/09 1115) Pulse Rate:  [76-98] 76 (06/09 1115) Resp:  [18-38] 38 (06/09 1115) BP: (84-159)/(53-79) 97/58 (06/09 1115) SpO2:  [95 %-99 %] 97 % (06/09 1115) FiO2 (%):  [30 %] 30 % (06/09 1133) Weight:  [72.5 kg-75 kg] 72.5 kg (06/09 1115)  General: lying in bed,not in apparent distress CVS: pulse-normal rate and rhythm RS: breathing comfortably,intubated, on pressor support Extremities: normal,warm  Neuro: SW:FUXNATFT, does not open eyes to noxious stimuli CN: Pupils reacting to light, corneal reflex intact, gag reflex intact Motor: does not withdraw to noxious stimuli in all extremities  Basic Metabolic Panel: Recent Labs  Lab 05/01/20 0308 05/01/20 0837 05/02/20 0419 05/02/20 0751 05/02/20 1810 05/03/20 0507 05/03/20 0735 05/03/20 0735 05/03/20 1818 05/03/20 1818 05/04/20 0253 05/04/20 0833 05/04/20 1810 05/05/20 0507 05/05/20 0814  NA 135   < >  --    < >   < >  --  135  --  136  --   --  137 137  --  135  K 4.5   < >  --    < >   < >  --  4.4  --  5.0  --   --  5.5* 5.5*  --  4.9  CL 93*   < >  --    < >   < >  --  94*  --  95*  --   --  95* 98  --  95*  CO2 25   < >  --    < >   < >  --  27  --  26  --   --  26 23  --  22  GLUCOSE 253*   < >  --    < >   < >  --  244*  --  184*  --   --  117* 182*  --  176*  BUN 68*   < >  --    < >   < >  --  55*  --  49*  --   --  77* 97*  --  85*  CREATININE 6.23*   < >  --    < >   < >  --  3.67*  --  3.03*  --   --  4.05* 4.79*  --  4.35*  CALCIUM 8.8*   < >  --    < >   < >  --  9.7   < > 10.3   < >  --  10.2 10.2  --  9.8  MG 2.0  --  2.0  --   --  2.4  --   --   --   --  2.3  --   --  2.7*  --   PHOS  --    < >  --    < >   < > 4.8* 4.2   < > 4.1   < > 4.6 4.7* 5.0* 5.8* 4.3   < > = values in this interval not  displayed.    CBC: Recent Labs  Lab 05/01/20 0308 05/02/20 0419 05/03/20 0507 05/04/20 0253 05/05/20 0507  WBC 4.2 5.2 3.9* 5.2 6.6  HGB 9.2* 10.2* 10.0* 11.2* 10.3*  HCT 30.1*  33.1* 32.5* 35.6* 32.8*  MCV 89.9 90.4 88.3 86.0 86.1  PLT 90* 115* 125* 166 164     Coagulation Studies: No results for input(s): LABPROT, INR in the last 72 hours.  Imaging No new brain imaging overnight  ASSESSMENT AND PLAN: 64 year old male with end-stage renal disease on hemodialysis who was admitted on 6/1 after presenting with hyperkalemia, acute hypoxic failure and pulmonary edema from volume overload in the setting of missed hemodialysis x2. EEG was obtained as part of encephalopathy work-up and showed 7 seizures without clinical signs arising from left hemisphere. Patient was started on long-term EEG and had multiple seizures (average 6 to 8/hr) arising from left and right hemisphere. Keppra dose was increased and Depakote was added. However seizures persisted and therefore patient was started on Versed and eventually propofol was added.Patient was then started on Vimpat and finally Onfi was added after which seizures stopped. Patient was gradually weaned off of propofol followed by Versed. Last dose of Versed on 05/02/2020 at around 7 AM.  Nonconvulsive seizures, left and right hemisphere(resolved) Epilepsy, left and right hemisphere End-stage renal disease on hemodialysis -Etiology for seizures: Patient's Keppra was stopped, possible toxicity from missing hemodialysis -No significant change in neuro exam at this point except patient does not withdraw to noxious stimuli in bilateral lower extremities.  Recommendations -Continue valproic acid at 4 mg 3 times daily.  Level was subtherapeutic at 36.  Therefore can increase if patient has any clinical seizures. -Continue Keppra 1000 mg twice dailywith an additional 500 mg dose after hemodialysis (renally adjusted therefore cannot  increase dose any further),Vimpat 100 mg twice daily and Onfi 5 mg daily -Patient continues to be comatose however does have cranial nerve reflexes.  Given his end-stage renal disease and prolonged seizures, patient may need longer period to recover.  We will continue to do serial neurologic exams for neuro prognostication. However if by end of the week patient continues to be comatose without significant improvement, then chances of neurologic recovery will likely be marginal. -Management of rest of comorbidities per primary team.  ADDENDUM -Patient sister was at bedside later.  Patient's son also called and I spoke with him on phone.  Informed that patient had multiple seizures on arrival which have since resolved.  However he is on multiple antiepileptics.  MRI brain did not show any acute abnormality.  However patient continues to be comatose even after being off of sedation.  Therefore, I am concerned that patient has suffered brain injury.  Patient's family and son will discuss amongst themselves further goals of care and inform us over the next few days.   CRITICAL CARE Performed by: Lora Havens   Total critical care time:47minutes  Critical care time was exclusive of separately billable procedures and treating other patients.  Critical care was necessary to treat or prevent imminent or life-threatening deterioration.  Critical care was time spent personally by me on the following activities: development of treatment plan with patient and/or surrogate as well as nursing, discussions with consultants, evaluation of patient's response to treatment, examination of patient, obtaining history from patient or surrogate, ordering and performing treatments and interventions, ordering and review of laboratory studies, ordering and review of radiographic studies, pulse oximetry and re-evaluation of patient's condition.  Zeb Comfort Epilepsy [facility] For  questions after 5pm please refer to AMION to reach the Neurologist on call

## 2020-05-05 NOTE — Progress Notes (Signed)
[place] Progress Note Patient Name: Jared [name]. DOB: 02-Jun-1956 MRN: [id number]   Date of Service  05/05/2020  HPI/Events of Note  Pt receiving enteral nutrition,  Pt with over 450 ml residual tube feed.  eICU Interventions  Stat KUB r/o ileus ordered.        Kerry Kass Ronette Hank 05/05/2020, 8:29 PM

## 2020-05-05 NOTE — Progress Notes (Signed)
NAME:  Jared [name], Jared [name] MRN:  [id number], DOB:  Apr 08, 1956, LOS: 65 ADMISSION DATE:  05/20/2020, CONSULTATION DATE: 6/1 REFERRING MD: Dr. Tyrone Nine EDP, CHIEF COMPLAINT: Unresponsiveness  Brief History   64 year old male with ESRD on HD unresponsive from home.  Intubated in ED.  Has missed 2 dialysis sessions.  Hyperkalemic and volume overloaded in the ED.  Admitted to ICU for emergent hemodialysis.  Past Medical History   has a past medical history of Anemia, Cancer ([place]), Chronic kidney disease, Diabetes mellitus without complication ([place]), GERD (gastroesophageal reflux disease), Hypertension, Seizures ([place]), Stroke ([place]), and Thyroid disease.  Significant Hospital Events   6/1 admit for emergent dialysis. Intubated 6/2 Seizures 6/4 remains in status epilepticus, shock due to heavy sedation.  6/5 Bps improved, starting to wean sedation 6/6 sedation stopped  Consults:  Nephrology Neurology  Procedures:  ETT 6/1> R Fem CVC 6/3>6/7  Significant Diagnostic Tests:  CT head 6/1 > chronic microvascular ischemic changes, volume loss. No acute intracranial abnormalities CXR 6/2> improved pulmonary edema  MRI Brain 6/2> no acute intracranial abnormality, no findings suggestive of PRES.  CXR 6/3> lower lung volumes. 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 Bowel loops visible.  EEG 6/3 > subclinical seizures, multiple Overnight EEG  6/4 > seizures without clinical signs, average 6-8/ hour arising from left and right hemisphere, lasting 20 seconds on an average.   Micro Data:  All neg  Antimicrobials:  6/4>>6/7 rocephin  Interim history/subjective:  No acute events overnight.   Objective   Blood pressure 136/79, pulse 85, temperature 98.7 F (37.1 C), temperature source Axillary, resp. rate (!) 31, height 5\' 8"  (1.727 m), weight 75 kg, SpO2 97 %.    Vent Mode: PSV;CPAP FiO2 (%):  [30 %] 30 % Set Rate:  [18 bmp] 18 bmp Vt Set:  [550 mL] 550 mL PEEP:  [5 cmH20] 5  cmH20 Pressure Support:  [15 cmH20] 15 cmH20 Plateau Pressure:  [17 cmH20-22 cmH20] 17 cmH20   Intake/Output Summary (Last 24 hours) at 05/05/2020 0816 Last data filed at 05/05/2020 0600 Gross per 24 hour  Intake 1500 ml  Output 0 ml  Net 1500 ml   Filed Weights   05/04/20 0334 05/05/20 0406 05/05/20 0730  Weight: 74.6 kg 75 kg 75 kg    Examination:  GEN: Middle aged appearing male on vent HEENT: [place]/AT, PERRL. ETT in place.  CV: RRR, no MRG PULM: Clear bilateral breath sounds GI: Soft, +BS, soft EXT: He has mild diffuse anasarca, he has a left upper extremity fistula NEURO: comatose. No command following, no response to pain. SKIN: No rashes   Resolved Hospital Problem list    Aspiration pneumonitis vs. Pneumonia- with fevers, leukocytosis, MRSA swab neg, tracheal aspirate neg - Short course of ceftriaxone reasonable, end date placed for 6/7 Hypotension related to sedation- resolved  Assessment & Plan:   Status epilepticus-MRI without evidence of PRES.  Has hx of seizures in past.   VEEG monitoring off. - AEDs per neurology (Keppra & VPA)  Comatose state- related to sedation, going to take a while to wake up given doses of sedation he required for seizure suppression - Watch and wait. Neurology feels that if no recovery by the end of the week, likelihood for recovery is low.   Acute hypoxic respiratory failure- due to inability to handle secretions, aspiration, volume overload - fluid removal by HD - VAP prevention bundle - current sedation level precludes extbuation  ESRD on HD  - Continue  iHD, negative as possible from my standpoint - Appreciate nephrology assistance.   DM, poorly controlled -A1c 9.6, worse 6/7 - Increase levemir a bit more.  - SSI  Pancytopenia- normocytic, stable, question if related to cancer vs. Meds  Hx prostate cancer-followed by [facility] , do not have nubeqa on formulary  Loose Stools-present on admission, C diff neg -BMS  Best  practice:  Diet: TF Pain/Anxiety/Delirium protocol (if indicated): per neurology VAP protocol (if indicated): yes DVT prophylaxis: SQH GI prophylaxis: PPI Glucose control: SSI  + levemir  Mobility: BR Code Status: FULL  Family Communication: Updated sister via phone 6/9 Disposition: ICU  Critical care time 39 minutes  Georgann Housekeeper, AGACNP-BC [place] for personal pager PCCM on call pager [phone]  05/05/2020 8:38 AM

## 2020-05-05 NOTE — Progress Notes (Signed)
[facility] Progress Note  Subjective: pt on vent , off sedation now since 6/6 - still not waking up On HD currently  Vitals:   05/05/20 0815 05/05/20 0830 05/05/20 0845 05/05/20 0900  BP: 118/67 110/64 109/69 106/73  Pulse: 85 87 89 92  Resp: (!) 34 (!) 34 (!) 35 (!) 34  Temp:      TempSrc:      SpO2: 95% 96% 96% 96%  Weight:      Height:        Exam: Vent, no sedation, does not wake to voice or shoulder shake for me  no jvd  Chest cta bilat  Cor reg no RG  Abd soft ntnd no ascites   Ext +2 UE> trace LE edema   LUA aVF+bruit    OP HD: TTS NW  4h  400/800  2/2 bath 72.5kg  Hep 2300   L AVF   - hect 7 ug  Assessment/ Plan: 1. Seizures - non-convulsive seizures per EEG, now off sedation, on keppra. Per neuro/ pmd. MRI negative, no evidence of PRES.  Neuro thinks slow to wake due to comorbids, will obs until end of week and if not meaningful waking by then prognosis will be poor. 2. Fever/ pulm infiltrates - possible PNA improving. S/p ceftriaxone per short course. 3. AMS: multifactorial, improving per chart as sedation clears; had seizures, poss element of uremia initially in setting of missed HD, uremia wouldn't still be contributing as he's been having good clearance here.  4. Acute resp failure/ vol overload/ pulm edema - 6/5 CXR improved without edema, cont UF with HD 5. ESRD - on HD TTS, had HD here Sat, Sun, Mon for volume. HD today. 6. Secondary hyperPTH: cont outpt hectorol dose   Jannifer Hick MD [facility] Pager [phone]   Recent Labs  Lab 05/04/20 [phone] 05/04/20 5809 05/04/20 1810 05/04/20 1810 05/05/20 0507 05/05/20 0814  K  --    < > 5.5*  --   --  4.9  BUN  --    < > 97*  --   --  85*  CREATININE  --    < > 4.79*  --   --  4.35*  CALCIUM  --    < > 10.2  --   --  9.8  PHOS 4.6   < > 5.0*   < > 5.8* 4.3  HGB 11.2*  --   --   --  10.3*  --    < > = values in this interval not displayed.   Inpatient medications: .  amLODipine  5 mg Per Tube Daily  . carvedilol  25 mg Per Tube BID WC  . chlorhexidine gluconate (MEDLINE KIT)  15 mL Mouth Rinse BID  . Chlorhexidine Gluconate Cloth  6 each Topical Q0600  . cloBAZam  5 mg Per Tube Daily  . docusate  100 mg Per Tube BID  . feeding supplement (PRO-STAT SUGAR FREE 64)  30 mL Per Tube TID  . heparin  5,000 Units Subcutaneous Q8H  . insulin aspart  0-15 Units Subcutaneous Q4H  . insulin detemir  28 Units Subcutaneous BID  . mouth rinse  15 mL Mouth Rinse 10 times per day  . pantoprazole sodium  40 mg Per Tube QHS  . valproic acid  400 mg Per Tube TID   . sodium chloride Stopped (05/04/20 0207)  . feeding supplement (VITAL 1.5 CAL) 50 mL/hr at 05/05/20 0500  . lacosamide (VIMPAT) IV Stopped (  05/05/20 0131)  . levETIRAcetam Stopped (05/04/20 2136)  . levETIRAcetam Stopped (05/04/20 1524)  . midazolam Stopped (05/02/20 1150)  . phenylephrine (NEO-SYNEPHRINE) Adult infusion     acetaminophen (TYLENOL) oral liquid 160 mg/5 mL, dextrose, docusate sodium, fentaNYL (SUBLIMAZE) injection, fentaNYL (SUBLIMAZE) injection, hydrALAZINE, polyethylene glycol

## 2020-05-06 DIAGNOSIS — G9340 Encephalopathy, unspecified: Secondary | ICD-10-CM

## 2020-05-06 LAB — CBC
HCT: 34 % — ABNORMAL LOW (ref 39.0–52.0)
Hemoglobin: 10.9 g/dL — ABNORMAL LOW (ref 13.0–17.0)
MCH: 27.5 pg (ref 26.0–34.0)
MCHC: 32.1 g/dL (ref 30.0–36.0)
MCV: 85.6 fL (ref 80.0–100.0)
Platelets: 201 10*3/uL (ref 150–400)
RBC: 3.97 MIL/uL — ABNORMAL LOW (ref 4.22–5.81)
RDW: 18.9 % — ABNORMAL HIGH (ref 11.5–15.5)
WBC: 10.5 10*3/uL (ref 4.0–10.5)
nRBC: 0 % (ref 0.0–0.2)

## 2020-05-06 LAB — RENAL FUNCTION PANEL
Albumin: 2.6 g/dL — ABNORMAL LOW (ref 3.5–5.0)
Albumin: 2.3 g/dL — ABNORMAL LOW (ref 3.5–5.0)
Anion gap: 19 — ABNORMAL HIGH (ref 5–15)
Anion gap: 19 — ABNORMAL HIGH (ref 5–15)
BUN: 100 mg/dL — ABNORMAL HIGH (ref 8–23)
BUN: 124 mg/dL — ABNORMAL HIGH (ref 8–23)
CO2: 24 mmol/L (ref 22–32)
CO2: 22 mmol/L (ref 22–32)
Calcium: 10.2 mg/dL (ref 8.9–10.3)
Calcium: 9.8 mg/dL (ref 8.9–10.3)
Chloride: 95 mmol/L — ABNORMAL LOW (ref 98–111)
Chloride: 95 mmol/L — ABNORMAL LOW (ref 98–111)
Creatinine, Ser: 4.99 mg/dL — ABNORMAL HIGH (ref 0.61–1.24)
Creatinine, Ser: 5.87 mg/dL — ABNORMAL HIGH (ref 0.61–1.24)
GFR calc Af Amer: 13 mL/min — ABNORMAL LOW (ref >60)
GFR calc Af Amer: 11 mL/min — ABNORMAL LOW (ref >60)
GFR calc non Af Amer: 11 mL/min — ABNORMAL LOW (ref >60)
GFR calc non Af Amer: 9 mL/min — ABNORMAL LOW (ref >60)
Glucose, Bld: 94 mg/dL (ref 70–99)
Glucose, Bld: 175 mg/dL — ABNORMAL HIGH (ref 70–99)
Phosphorus: 6.7 mg/dL — ABNORMAL HIGH (ref 2.5–4.6)
Phosphorus: 7.6 mg/dL — ABNORMAL HIGH (ref 2.5–4.6)
Potassium: 5.4 mmol/L — ABNORMAL HIGH (ref 3.5–5.1)
Potassium: 6.2 mmol/L — ABNORMAL HIGH (ref 3.5–5.1)
Sodium: 138 mmol/L (ref 135–145)
Sodium: 136 mmol/L (ref 135–145)

## 2020-05-06 LAB — GLUCOSE, CAPILLARY
Glucose-Capillary: 140 mg/dL — ABNORMAL HIGH (ref 70–99)
Glucose-Capillary: 66 mg/dL — ABNORMAL LOW (ref 70–99)
Glucose-Capillary: 89 mg/dL (ref 70–99)
Glucose-Capillary: 120 mg/dL — ABNORMAL HIGH (ref 70–99)
Glucose-Capillary: 104 mg/dL — ABNORMAL HIGH (ref 70–99)
Glucose-Capillary: 175 mg/dL — ABNORMAL HIGH (ref 70–99)
Glucose-Capillary: 179 mg/dL — ABNORMAL HIGH (ref 70–99)

## 2020-05-06 LAB — PHOSPHORUS: Phosphorus: 6.3 mg/dL — ABNORMAL HIGH (ref 2.5–4.6)

## 2020-05-06 LAB — MAGNESIUM: Magnesium: 2.5 mg/dL — ABNORMAL HIGH (ref 1.7–2.4)

## 2020-05-06 MED ORDER — INSULIN DETEMIR 100 UNIT/ML ~~LOC~~ SOLN
22.0000 [IU] | Freq: Two times a day (BID) | SUBCUTANEOUS | Status: DC
  Administered 2020-05-06 – 2020-05-10 (×9): 22 [IU] via SUBCUTANEOUS
  Filled 2020-05-06 (×12): qty 0.22

## 2020-05-06 MED ORDER — INSULIN ASPART 100 UNIT/ML ~~LOC~~ SOLN
0.0000 [IU] | SUBCUTANEOUS | Status: DC
  Administered 2020-05-06 – 2020-05-07 (×8): 2 [IU] via SUBCUTANEOUS
  Administered 2020-05-08: 3 [IU] via SUBCUTANEOUS
  Administered 2020-05-08: 1 [IU] via SUBCUTANEOUS
  Administered 2020-05-08: 2 [IU] via SUBCUTANEOUS
  Administered 2020-05-08 – 2020-05-09 (×3): 3 [IU] via SUBCUTANEOUS
  Administered 2020-05-09: 2 [IU] via SUBCUTANEOUS
  Administered 2020-05-09: 3 [IU] via SUBCUTANEOUS

## 2020-05-06 NOTE — Progress Notes (Signed)
Inpatient Diabetes Program Recommendations  AACE/ADA: New Consensus Statement on Inpatient Glycemic Control (2015)  Target Ranges:  Prepandial:   less than 140 mg/dL      Peak postprandial:   less than 180 mg/dL (1-2 hours)      Critically ill patients:  140 - 180 mg/dL   Lab Results  Component Value Date   GLUCAP 89 05/06/2020   HGBA1C 9.6 (H) 04/28/2020    Review of Glycemic Control Results for Jared [name], Jared [name] (MRN [id number]) as of 05/06/2020 10:52  Ref. Range 05/05/2020 08:06 05/05/2020 11:05 05/05/2020 15:15 05/05/2020 19:10 05/05/2020 23:27 05/06/2020 04:33 05/06/2020 05:10 05/06/2020 07:58  Glucose-Capillary Latest Ref Range: 70 - 99 mg/dL 162 (H) 188 (H) 223 (H) 305 (H) 137 (H) 66 (L) 140 (H) 89   Inpatient Diabetes Program Recommendations:   -Novolog change to sensitive. -Decrease Levemir to 25 units bid -Add tube feed coverage if needed  Thank you, Bethena Roys E. Dray Dente, RN, MSN, CDE  Diabetes Coordinator Inpatient Glycemic Control Team Team Pager [phone] (8am-5pm) 05/06/2020 10:55 AM

## 2020-05-06 NOTE — Progress Notes (Signed)
[facility] Progress Note  Subjective: pt on vent , off sedation now since 6/6 - still not waking up High residuals yesterday, no ileus on imaging  Vitals:   05/06/20 0740 05/06/20 0800 05/06/20 0803 05/06/20 0900  BP:  (!) 170/83  (!) 165/76  Pulse: 84 85  87  Resp: (!) 26 (!) 36  (!) 37  Temp:   98.9 F (37.2 C)   TempSrc:   Oral   SpO2: 94% 96%  98%  Weight:      Height:        Exam: Vent, no sedation, does not wake to voice or shoulder shake for me  no jvd  Chest cta bilat  Cor reg no RG  Abd soft ntnd no ascites   Ext +2 UE> trace LE edema   LUA aVF+bruit    OP HD: TTS NW  4h  400/800  2/2 bath 72.5kg  Hep 2300   L AVF   - hect 7 ug  Assessment/ Plan: 1. Seizures - non-convulsive seizures per EEG, now off sedation, on keppra. Per neuro/ pmd. MRI negative, no evidence of PRES.  Neuro thinks slow to wake due to comorbids, will obs until end of week and if not meaningful waking by then prognosis will be poor. 2. Fever/ pulm infiltrates - possible PNA improved. S/p ceftriaxone per short course. 3. AMS: multifactorial, improving per chart as sedation clears; had seizures, poss element of uremia initially in setting of missed HD, uremia wouldn't still be contributing as he's been having good clearance here.  4. Acute resp failure/ vol overload/ pulm edema - 6/5 CXR improved without edema, cont UF with HD 5. ESRD - on HD TTS, had HD here Sat, Sun, Mon for volume. HD tomorrow. 6. Secondary hyperPTH: cont outpt hectorol dose   Jannifer Hick MD [facility] Pager [phone]   Recent Labs  Lab 05/05/20 0507 05/05/20 0814 05/05/20 1740 05/05/20 1740 05/06/20 0302 05/06/20 0812  K  --    < > 5.1  --   --  5.4*  BUN  --    < > 75*  --   --  100*  CREATININE  --    < > 3.97*  --   --  4.99*  CALCIUM  --    < > 9.8  --   --  10.2  PHOS 5.8*   < > 5.6*   < > 6.3* 6.7*  HGB 10.3*  --   --   --  10.9*  --    < > = values in this interval not  displayed.   Inpatient medications: . amLODipine  5 mg Per Tube Daily  . carvedilol  25 mg Per Tube BID WC  . chlorhexidine gluconate (MEDLINE KIT)  15 mL Mouth Rinse BID  . Chlorhexidine Gluconate Cloth  6 each Topical Q0600  . cloBAZam  5 mg Per Tube Daily  . docusate  100 mg Per Tube BID  . feeding supplement (PRO-STAT SUGAR FREE 64)  30 mL Per Tube TID  . heparin  5,000 Units Subcutaneous Q8H  . insulin aspart  0-15 Units Subcutaneous Q4H  . insulin detemir  28 Units Subcutaneous BID  . mouth rinse  15 mL Mouth Rinse 10 times per day  . pantoprazole sodium  40 mg Per Tube QHS  . valproic acid  400 mg Per Tube TID   . sodium chloride Stopped (05/04/20 0207)  . feeding supplement (VITAL 1.5 CAL) 50 mL/hr at  05/06/20 0900  . lacosamide (VIMPAT) IV Stopped (05/05/20 2320)  . levETIRAcetam Stopped (05/05/20 2231)  . levETIRAcetam Stopped (05/04/20 1524)   acetaminophen (TYLENOL) oral liquid 160 mg/5 mL, dextrose, docusate sodium, fentaNYL (SUBLIMAZE) injection, fentaNYL (SUBLIMAZE) injection, hydrALAZINE, polyethylene glycol

## 2020-05-06 NOTE — Progress Notes (Signed)
NAME:  Jared [name], Jared [name] MRN:  [id number], DOB:  1956-07-22, LOS: 64 ADMISSION DATE:  05/24/2020, CONSULTATION DATE: 6/1 REFERRING MD: Dr. Tyrone Nine EDP, CHIEF COMPLAINT: Unresponsiveness  Brief History   64 year old male with ESRD on HD unresponsive from home.  Intubated in ED.  Has missed 2 dialysis sessions.  Hyperkalemic and volume overloaded in the ED.  Admitted to ICU for emergent hemodialysis.  Past Medical History   has a past medical history of Anemia, Cancer ([place]), Chronic kidney disease, Diabetes mellitus without complication ([place]), GERD (gastroesophageal reflux disease), Hypertension, Seizures ([place]), Stroke ([place]), and Thyroid disease.  Significant Hospital Events   6/1 admit for emergent dialysis. Intubated 6/2 Seizures 6/4 remains in status epilepticus, shock due to heavy sedation.  6/5 Bps improved, starting to wean sedation 6/6 sedation stopped  Consults:  Nephrology Neurology  Procedures:  ETT 6/1> R Fem CVC 6/3>6/7  Significant Diagnostic Tests:  CT head 6/1 > chronic microvascular ischemic changes, volume loss. No acute intracranial abnormalities CXR 6/2> improved pulmonary edema  MRI Brain 6/2> no acute intracranial abnormality, no findings suggestive of PRES.  CXR 6/3> lower lung volumes. 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 Bowel loops visible.  EEG 6/3 > subclinical seizures, multiple Overnight EEG  6/4 > seizures without clinical signs, average 6-8/ hour arising from left and right hemisphere, lasting 20 seconds on an average.   Micro Data:  All neg  Antimicrobials:  6/4>>6/7 rocephin  Interim history/subjective:  HIGH TF residual  Overnight. KUB with no evidence of ileus/obstruction.   Objective   Blood pressure (!) 141/76, pulse 84, temperature 98.5 F (36.9 C), temperature source Oral, resp. rate (!) 26, height 5\' 8"  (1.727 m), weight 72.7 kg, SpO2 94 %.    Vent Mode: PRVC FiO2 (%):  [30 %] 30 % Set Rate:  [18 bmp] 18 bmp Vt  Set:  [550 mL] 550 mL PEEP:  [5 cmH20] 5 cmH20 Plateau Pressure:  [16 cmH20-22 cmH20] 16 cmH20   Intake/Output Summary (Last 24 hours) at 05/06/2020 0746 Last data filed at 05/06/2020 0600 Gross per 24 hour  Intake 1210 ml  Output 3190 ml  Net -1980 ml   Filed Weights   05/05/20 0730 05/05/20 1115 05/06/20 0500  Weight: 75 kg 72.5 kg 72.7 kg    Examination:   GEN: Middle aged appearing male on vent HEENT: [place]/AT, PERRL, no JVD  CV: RRR, no MRG PULM: Clear GI: Soft, +BS, soft EXT: LUE fistula + thrill NEURO: comatose. No command following, no response to pain. SKIN: Grossly intact   Resolved Hospital Problem list    Aspiration pneumonitis vs. Pneumonia- with fevers, leukocytosis, MRSA swab neg, tracheal aspirate neg - Short course of ceftriaxone reasonable, end date placed for 6/7 Hypotension related to sedation- resolved  Assessment & Plan:   Status epilepticus-MRI without evidence of PRES.  Has hx of seizures in past.   VEEG monitoring off. - AED per neurology (Keppra & VPA)  Comatose state- related to sedation, going to take a while to wake up given doses of sedation he required for seizure suppression. - Watch and wait. Neurology feels that if no recovery by the end of the week, likelihood for recovery is low. Family is discussing options after conversation regarding goals of care with Dr. Hortense Ramal yesterday.   Acute hypoxic respiratory failure- due to inability to handle secretions, aspiration, volume overload - fluid removal by HD - VAP prevention bundle - current sedation level precludes extubation - Weaning 8/5  today with RR 30 and volumes 330s  ESRD on HD  - Continue iHD, keep him volume negative as able - Appreciate nephrology assistance.   DM, poorly controlled -A1c 9.6, worse 6/7 - Keep levemir at same dose. If glucose low again overnight will need to decrease.  - SSI  Pancytopenia- normocytic, stable, question if related to cancer vs. Meds  Hx prostate  cancer-followed by [facility], do not have nubeqa on formulary  Loose Stools- present on admission, C diff neg -BMS  Best practice:  Diet: TF Pain/Anxiety/Delirium protocol (if indicated): per neurology VAP protocol (if indicated): yes DVT prophylaxis: SQH GI prophylaxis: PPI Glucose control: SSI  + levemir  Mobility: BR Code Status: FULL  Family Communication: Neurology addressing prognosis with family. Will await this discussion.  Disposition: ICU  Critical care time 35 minutes  Georgann Housekeeper, AGACNP-BC [place] for personal pager PCCM on call pager [phone]  05/06/2020 7:46 AM

## 2020-05-06 NOTE — Progress Notes (Signed)
Subjective: No acute events overnight.  Continues to be comatose.  Son and sister at bedside today.  ROS: Unable to obtain due to poor mental status  Examination  Vital signs in last 24 hours: Temp:  [98.5 F (36.9 C)-100.1 F (37.8 C)] 99 F (37.2 C) (06/10 1111) Pulse Rate:  [79-93] 82 (06/10 1200) Resp:  [16-37] 16 (06/10 1200) BP: (117-170)/(67-83) 137/69 (06/10 1200) SpO2:  [93 %-99 %] 97 % (06/10 1200) FiO2 (%):  [30 %] 30 % (06/10 1200) Weight:  [72.7 kg] 72.7 kg (06/10 0500)  General: lying in bed,not in apparent distress CVS: pulse-normal rate and rhythm RS: breathing comfortably,intubated, on pressor support Extremities: normal,warm  Neuro: KW:IOXBDZHG, does not open eyes to noxious stimuli DJ:MEQAST reacting to light,corneal reflex intact, gag reflex intact Motor: Withdraws to noxious stimuli in bilateral lower extremities, no movement to noxious stimuli in bilateral upper extremities  Basic Metabolic Panel: Recent Labs  Lab 05/02/20 0419 05/02/20 0751 05/03/20 0507 05/03/20 0735 05/03/20 1818 05/04/20 0253 05/04/20 4196 05/04/20 2229 05/04/20 1810 05/04/20 1810 05/05/20 0507 05/05/20 0814 05/05/20 1740 05/06/20 0302 05/06/20 0812  NA  --    < >  --    < >   < >  --  137  --  137  --   --  135 136  --  138  K  --    < >  --    < >   < >  --  5.5*  --  5.5*  --   --  4.9 5.1  --  5.4*  CL  --    < >  --    < >   < >  --  95*  --  98  --   --  95* 96*  --  95*  CO2  --    < >  --    < >   < >  --  26  --  23  --   --  22 22  --  24  GLUCOSE  --    < >  --    < >   < >  --  117*  --  182*  --   --  176* 295*  --  94  BUN  --    < >  --    < >   < >  --  77*  --  97*  --   --  85* 75*  --  100*  CREATININE  --    < >  --    < >   < >  --  4.05*  --  4.79*  --   --  4.35* 3.97*  --  4.99*  CALCIUM  --    < >  --    < >   < >  --  10.2   < > 10.2   < >  --  9.8 9.8  --  10.2  MG 2.0  --  2.4  --   --  2.3  --   --   --   --  2.7*  --   --  2.5*  --    PHOS  --    < > 4.8*   < >   < > 4.6 4.7*   < > 5.0*   < > 5.8* 4.3 5.6* 6.3* 6.7*   < > = values in this interval not displayed.    CBC:  Recent Labs  Lab 05/02/20 0419 05/03/20 0507 05/04/20 0253 05/05/20 0507 05/06/20 0302  WBC 5.2 3.9* 5.2 6.6 10.5  HGB 10.2* 10.0* 11.2* 10.3* 10.9*  HCT 33.1* 32.5* 35.6* 32.8* 34.0*  MCV 90.4 88.3 86.0 86.1 85.6  PLT 115* 125* 166 164 201     Coagulation Studies: No results for input(s): LABPROT, INR in the last 72 hours.  Imaging No new brain imaging overnight  ASSESSMENT AND PLAN:64 year old male with end-stage renal disease on hemodialysis who was admitted on 6/1 after presenting with hyperkalemia, acute hypoxic failure and pulmonary edema from volume overload in the setting of missed hemodialysis x2. EEG was obtained as part of encephalopathy work-up and showed 7 seizures without clinical signs arising from left hemisphere. Patient was started on long-term EEG and had multiple seizures (average 6 to 8/hr) arising from left and right hemisphere. Keppra dose was increased and Depakote was added. However seizures persisted and therefore patient was started on Versed and eventually propofol was added.Patient was then started on Vimpat and finally Onfi was added after which seizures stopped. Patient was gradually weaned off of propofol followed by Versed. Last dose of Versed on 05/02/2020 at around 7 AM.  Nonconvulsive seizures, left and right hemisphere(resolved) Epilepsy, left and right hemisphere End-stage renal disease on hemodialysis -Etiology for seizures: Patient's Keppra was stopped, possible toxicity from missing hemodialysis  Recommendations -Continue valproic acid at 4 mg 3 times daily. Level was subtherapeutic at 36. Therefore can increase if patient has any clinical seizures. -ContinueKeppra 1000 mg twice dailywith an additional 500 mg dose after hemodialysis (renally adjusted therefore cannot increase dose any  further),Vimpat 100 mg twice daily and Onfi 5 mg daily -Patient continues to be comatose however does have cranial nerve reflexes. Given his end-stage renal disease and prolonged seizures, patient may need longer period to recover. We will continue to do serial neurologic exams for neuro prognostication.However if by end of the week patient continues to be comatose without significant improvement, then chances of neurologic recovery will likely be marginal.  Again discussed this with patient's son and sister at bedside today.  Also discussed with critical care team.  Plan is to wait till Monday 05/10/2020 to make a decision regarding further goals of care. -Management of rest of comorbidities per primary team.    CRITICAL CARE Performed by: Lora Havens   Total critical care time:75minutes  Critical care time was exclusive of separately billable procedures and treating other patients.  Critical care was necessary to treat or prevent imminent or life-threatening deterioration.  Critical care was time spent personally by me on the following activities: development of treatment plan with patient and/or surrogate as well as nursing, discussions with consultants, evaluation of patient's response to treatment, examination of patient, obtaining history from patient or surrogate, ordering and performing treatments and interventions, ordering and review of laboratory studies, ordering and review of radiographic studies, pulse oximetry and re-evaluation of patient's condition.  Zeb Comfort Epilepsy [facility] For questions after 5pm please refer to AMION to reach the Neurologist on call

## 2020-05-07 LAB — BASIC METABOLIC PANEL
Anion gap: 21 — ABNORMAL HIGH (ref 5–15)
BUN: 141 mg/dL — ABNORMAL HIGH (ref 8–23)
CO2: 23 mmol/L (ref 22–32)
Calcium: 9.7 mg/dL (ref 8.9–10.3)
Chloride: 94 mmol/L — ABNORMAL LOW (ref 98–111)
Creatinine, Ser: 6.48 mg/dL — ABNORMAL HIGH (ref 0.61–1.24)
GFR calc Af Amer: 9 mL/min — ABNORMAL LOW (ref >60)
GFR calc non Af Amer: 8 mL/min — ABNORMAL LOW (ref >60)
Glucose, Bld: 161 mg/dL — ABNORMAL HIGH (ref 70–99)
Potassium: 6.2 mmol/L — ABNORMAL HIGH (ref 3.5–5.1)
Sodium: 138 mmol/L (ref 135–145)

## 2020-05-07 LAB — GLUCOSE, CAPILLARY
Glucose-Capillary: 157 mg/dL — ABNORMAL HIGH (ref 70–99)
Glucose-Capillary: 163 mg/dL — ABNORMAL HIGH (ref 70–99)
Glucose-Capillary: 172 mg/dL — ABNORMAL HIGH (ref 70–99)
Glucose-Capillary: 198 mg/dL — ABNORMAL HIGH (ref 70–99)
Glucose-Capillary: 155 mg/dL — ABNORMAL HIGH (ref 70–99)
Glucose-Capillary: 196 mg/dL — ABNORMAL HIGH (ref 70–99)

## 2020-05-07 LAB — RENAL FUNCTION PANEL
Albumin: 2.5 g/dL — ABNORMAL LOW (ref 3.5–5.0)
Albumin: 2.4 g/dL — ABNORMAL LOW (ref 3.5–5.0)
Anion gap: 20 — ABNORMAL HIGH (ref 5–15)
Anion gap: 18 — ABNORMAL HIGH (ref 5–15)
BUN: 139 mg/dL — ABNORMAL HIGH (ref 8–23)
BUN: 76 mg/dL — ABNORMAL HIGH (ref 8–23)
CO2: 23 mmol/L (ref 22–32)
CO2: 24 mmol/L (ref 22–32)
Calcium: 9.7 mg/dL (ref 8.9–10.3)
Calcium: 9.5 mg/dL (ref 8.9–10.3)
Chloride: 95 mmol/L — ABNORMAL LOW (ref 98–111)
Chloride: 95 mmol/L — ABNORMAL LOW (ref 98–111)
Creatinine, Ser: 6.41 mg/dL — ABNORMAL HIGH (ref 0.61–1.24)
Creatinine, Ser: 4.47 mg/dL — ABNORMAL HIGH (ref 0.61–1.24)
GFR calc Af Amer: 10 mL/min — ABNORMAL LOW (ref >60)
GFR calc Af Amer: 15 mL/min — ABNORMAL LOW (ref >60)
GFR calc non Af Amer: 8 mL/min — ABNORMAL LOW (ref >60)
GFR calc non Af Amer: 13 mL/min — ABNORMAL LOW (ref >60)
Glucose, Bld: 158 mg/dL — ABNORMAL HIGH (ref 70–99)
Glucose, Bld: 180 mg/dL — ABNORMAL HIGH (ref 70–99)
Phosphorus: 7.8 mg/dL — ABNORMAL HIGH (ref 2.5–4.6)
Phosphorus: 6.8 mg/dL — ABNORMAL HIGH (ref 2.5–4.6)
Potassium: 6.2 mmol/L — ABNORMAL HIGH (ref 3.5–5.1)
Potassium: 5.5 mmol/L — ABNORMAL HIGH (ref 3.5–5.1)
Sodium: 138 mmol/L (ref 135–145)
Sodium: 137 mmol/L (ref 135–145)

## 2020-05-07 LAB — CBC
HCT: 32.8 % — ABNORMAL LOW (ref 39.0–52.0)
Hemoglobin: 10.4 g/dL — ABNORMAL LOW (ref 13.0–17.0)
MCH: 27.2 pg (ref 26.0–34.0)
MCHC: 31.7 g/dL (ref 30.0–36.0)
MCV: 85.9 fL (ref 80.0–100.0)
Platelets: 232 10*3/uL (ref 150–400)
RBC: 3.82 MIL/uL — ABNORMAL LOW (ref 4.22–5.81)
RDW: 19 % — ABNORMAL HIGH (ref 11.5–15.5)
WBC: 11.6 10*3/uL — ABNORMAL HIGH (ref 4.0–10.5)
nRBC: 0 % (ref 0.0–0.2)

## 2020-05-07 LAB — PHOSPHORUS: Phosphorus: 7.8 mg/dL — ABNORMAL HIGH (ref 2.5–4.6)

## 2020-05-07 LAB — MAGNESIUM: Magnesium: 2.8 mg/dL — ABNORMAL HIGH (ref 1.7–2.4)

## 2020-05-07 MED ORDER — SORBITOL 70 % SOLN
60.0000 mL | Freq: Every day | Status: AC
  Administered 2020-05-07 – 2020-05-08 (×2): 60 mL
  Filled 2020-05-07 (×2): qty 60

## 2020-05-07 MED ORDER — LEVETIRACETAM IN NACL 500 MG/100ML IV SOLN
500.0000 mg | INTRAVENOUS | Status: DC
  Administered 2020-05-10: 500 mg via INTRAVENOUS
  Filled 2020-05-07: qty 100

## 2020-05-07 MED ORDER — METOCLOPRAMIDE HCL 5 MG/ML IJ SOLN
10.0000 mg | Freq: Four times a day (QID) | INTRAMUSCULAR | Status: AC
  Administered 2020-05-07 – 2020-05-08 (×6): 10 mg via INTRAVENOUS
  Filled 2020-05-07 (×6): qty 2

## 2020-05-07 MED ORDER — HEPARIN SODIUM (PORCINE) 1000 UNIT/ML IJ SOLN
INTRAMUSCULAR | Status: AC
  Administered 2020-05-07: 2000 [IU]
  Filled 2020-05-07: qty 2

## 2020-05-07 NOTE — Progress Notes (Addendum)
Subjective: No acute events overnight.  Patient's son at bedside.  ROS: unable to obtain due to poor mental status  Examination  Vital signs in last 24 hours: Temp:  [98.6 F (37 C)-99.9 F (37.7 C)] 98.9 F (37.2 C) (06/11 1100) Pulse Rate:  [74-94] 85 (06/11 1116) Resp:  [17-35] 32 (06/11 1116) BP: (85-145)/(55-72) 122/64 (06/11 1116) SpO2:  [93 %-100 %] 96 % (06/11 1116) FiO2 (%):  [30 %] 30 % (06/11 1116) Weight:  [71 kg-72.5 kg] 71 kg (06/11 1030)  General: lying in bed,not in apparent distress CVS: pulse-normal rate and rhythm RS: breathing comfortably,intubated, on pressor support Extremities: normal,warm  Neuro: EH:UDJSHFWY, does not open eyes to noxious stimuli OV:ZCHYIF reacting to light,corneal reflex intact, gag reflex intact Motor: Withdraws to noxious stimuli in bilateral lower extremities, no movement to noxious stimuli in bilateral upper extremities  Basic Metabolic Panel: Recent Labs  Lab 05/03/20 0507 05/03/20 0735 05/04/20 0253 05/04/20 0277 05/04/20 1810 05/05/20 0507 05/05/20 0814 05/05/20 0814 05/05/20 1740 05/05/20 1740 05/06/20 0302 05/06/20 0812 05/06/20 1745 05/07/20 0411  NA  --    < >  --    < >   < >  --  135  --  136  --   --  138 136 138  138  K  --    < >  --    < >   < >  --  4.9  --  5.1  --   --  5.4* 6.2* 6.2*  6.2*  CL  --    < >  --    < >   < >  --  95*  --  96*  --   --  95* 95* 95*  94*  CO2  --    < >  --    < >   < >  --  22  --  22  --   --  24 22 23  23   GLUCOSE  --    < >  --    < >   < >  --  176*  --  295*  --   --  94 175* 158*  161*  BUN  --    < >  --    < >   < >  --  85*  --  75*  --   --  100* 124* 139*  141*  CREATININE  --    < >  --    < >   < >  --  4.35*  --  3.97*  --   --  4.99* 5.87* 6.41*  6.48*  CALCIUM  --    < >  --    < >   < >  --  9.8   < > 9.8   < >  --  10.2 9.8 9.7  9.7  MG 2.4  --  2.3  --   --  2.7*  --   --   --   --  2.5*  --   --  2.8*  PHOS 4.8*   < > 4.6   < >   < >  5.8* 4.3   < > 5.6*  --  6.3* 6.7* 7.6* 7.8*  7.8*   < > = values in this interval not displayed.    CBC: Recent Labs  Lab 05/03/20 0507 05/04/20 0253 05/05/20 0507 05/06/20 0302 05/07/20 0411  WBC 3.9* 5.2 6.6 10.5 11.6*  HGB 10.0* 11.2* 10.3* 10.9* 10.4*  HCT 32.5* 35.6* 32.8* 34.0* 32.8*  MCV 88.3 86.0 86.1 85.6 85.9  PLT 125* 166 164 201 232     Coagulation Studies: No results for input(s): LABPROT, INR in the last 72 hours.  Imaging No new brain imaging overnight  ASSESSMENT AND PLAN:64 year old male with end-stage renal disease on hemodialysis who was admitted on 6/1 after presenting with hyperkalemia, acute hypoxic failure and pulmonary edema from volume overload in the setting of missed hemodialysis x2. EEG was obtained as part of encephalopathy work-up and showed 7 seizures without clinical signs arising from left hemisphere. Patient was started on long-term EEG and had multiple seizures (average 6 to 8/hr) arising from left and right hemisphere. Keppra dose was increased and Depakote was added. However seizures persisted and therefore patient was started on Versed and eventually propofol was added.Patient was then started on Vimpat and finally Onfi was added after which seizures stopped. Patient was gradually weaned off of propofol followed by Versed. Last dose of Versed on 05/02/2020 at around 7 AM.  Nonconvulsive seizures, left and right hemisphere(resolved) Epilepsy, left and right hemisphere End-stage renal disease on hemodialysis -Etiology for seizures: Patient's Keppra was stopped, possible toxicity from missing hemodialysis  Recommendations -We will stop clobazam to minimize any sedation  -Continue valproic acid at 4 mg 3 times daily. Level was subtherapeutic at 36. Therefore can increase if patient has any clinical seizures. -ContinueKeppra 1000 mg twice dailywith an additional 500 mg dose after hemodialysis (renally adjusted therefore cannot  increase dose any further),Vimpat 100 mg twice daily.  -Patient continues to be comatose however does have cranial nerve reflexes.Given his end-stage renal disease and prolonged seizures, patient may need longer period to recover. We will continue to do serial neurologic exams for neuro prognostication.However if by end of the week patient continues to be comatose without significant improvement, then chances of neurologic recovery will likely be marginal.  Plan is to wait till Monday 05/10/2020 to make a decision regarding further goals of care. -Management of rest of comorbidities per primary team.    CRITICAL CARE Performed by: Lora Havens  Total critical care time:66minutes  Critical care time was exclusive of separately billable procedures and treating other patients.  Critical care was necessary to treat or prevent imminent or life-threatening deterioration.  Critical care was time spent personally by me on the following activities: development of treatment plan with patient and/or surrogate as well as nursing, discussions with consultants, evaluation of patient's response to treatment, examination of patient, obtaining history from patient or surrogate, ordering and performing treatments and interventions, ordering and review of laboratory studies, ordering and review of radiographic studies, pulse oximetry and re-evaluation of patient's condition.  Zeb Comfort Epilepsy [facility] For questions after 5pm please refer to AMION to reach the Neurologist on call

## 2020-05-07 NOTE — Progress Notes (Signed)
NAME:  Jared, [name] MRN:  [id number], DOB:  01/18/1956, LOS: 57 ADMISSION DATE:  05/04/2020, CONSULTATION DATE: 6/1 REFERRING MD: Dr. Tyrone Nine EDP, CHIEF COMPLAINT: Unresponsiveness  Brief History   64 year old male with ESRD on HD unresponsive from home.  Intubated in ED.  Has missed 2 dialysis sessions.  Hyperkalemic and volume overloaded in the ED.  Admitted to ICU for emergent hemodialysis.  Past Medical History   has a past medical history of Anemia, Cancer ([place]), Chronic kidney disease, Diabetes mellitus without complication ([place]), GERD (gastroesophageal reflux disease), Hypertension, Seizures ([place]), Stroke ([place]), and Thyroid disease.  Significant Hospital Events   6/1 admit for emergent dialysis. Intubated 6/2 Seizures 6/4 remains in status epilepticus, shock due to heavy sedation.  6/5 Bps improved, starting to wean sedation 6/6 sedation stopped  Consults:  Nephrology Neurology  Procedures:  ETT 6/1> R Fem CVC 6/3>6/7  Significant Diagnostic Tests:  CT head 6/1 > chronic microvascular ischemic changes, volume loss. No acute intracranial abnormalities CXR 6/2> improved pulmonary edema  MRI Brain 6/2> no acute intracranial abnormality, no findings suggestive of PRES.  CXR 6/3> lower lung volumes. 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 Bowel loops visible.  EEG 6/3 > subclinical seizures, multiple Overnight EEG  6/4 > seizures without clinical signs, average 6-8/ hour arising from left and right hemisphere, lasting 20 seconds on an average.   Micro Data:  All neg  Antimicrobials:  6/4>>6/7 rocephin  Interim history/subjective:  No events.  Less responsive today.  Objective   Blood pressure 138/69, pulse 88, temperature 99.4 F (37.4 C), temperature source Oral, resp. rate 19, height 5\' 8"  (1.727 m), weight 72.5 kg, SpO2 96 %.    Vent Mode: PRVC FiO2 (%):  [30 %] 30 % Set Rate:  [18 bmp] 18 bmp Vt Set:  [550 mL] 550 mL PEEP:  [5 cmH20] 5  cmH20 Pressure Support:  [15 cmH20] 15 cmH20 Plateau Pressure:  [18 cmH20-23 cmH20] 18 cmH20   Intake/Output Summary (Last 24 hours) at 05/07/2020 0539 Last data filed at 05/07/2020 0600 Gross per 24 hour  Intake 1630.05 ml  Output 20 ml  Net 1610.05 ml   Filed Weights   05/05/20 1115 05/06/20 0500 05/07/20 0254  Weight: 72.5 kg 72.7 kg 72.5 kg    Examination:   GEN: Middle aged appearing male on vent HEENT: [place]/AT, PERRL, no JVD  CV: Irregular, +SEM PULM: Occasional scattered rhonci, no accessory muscle use GI: hypoactive BS, soft EXT: LUE fistula + thrill NEURO: no response to pain, does trigger vent, no oculocephalic reflex, +corneal and pupillary reflex SKIN: no rashes  K high- getting HD CBC benign  Resolved Hospital Problem list    Aspiration pneumonitis vs. Pneumonia- with fevers, leukocytosis, MRSA swab neg, tracheal aspirate neg - Short course of ceftriaxone reasonable, end date placed for 6/7 Hypotension related to sedation- resolved  Assessment & Plan:   Status epilepticus-MRI without evidence of PRES.  Has hx of seizures in past.   VEEG monitoring off. - AED per neurology (Keppra & VPA)  Comatose state- related to sedation, going to take a while to wake up given doses of sedation he required for seizure suppression. - Watch and wait. Discussed with family, will give until Monday until we make any big decisions.  They know patient would not want to live on life support like this.  Acute hypoxic respiratory failure- due to inability to handle secretions, aspiration, volume overload - fluid removal by HD - VAP  prevention bundle - current sedation level precludes extubation - PS trials as able  ESRD on HD  - Continue iHD, keep him volume negative as able - Appreciate nephrology assistance.   DM, poorly controlled -A1c 9.6 - Levemir, SSI, better today  Hx prostate cancer-followed by [facility], do not have nubeqa on formulary  Bowel regimen- was diarrhea  now constipated, sorbitol and reglan trial  Best practice:  Diet: TF Pain/Anxiety/Delirium protocol (if indicated): none VAP protocol (if indicated): yes DVT prophylaxis: SQH GI prophylaxis: PPI Glucose control: SSI  + levemir  Mobility: BR Code Status: FULL  Family Communication: updated at length 6/10 Disposition: ICU    The patient is critically ill with multiple organ systems failure and requires high complexity decision making for assessment and support, frequent evaluation and titration of therapies, application of advanced monitoring technologies and extensive interpretation of multiple databases. Critical Care Time devoted to patient care services described in this note independent of APP/resident time (if applicable)  is 32 minutes.   Erskine Emery MD [facility] 05/07/2020 7:17 AM Personal pager: [phone] If unanswered, please page CCM On-call: [phone]

## 2020-05-07 NOTE — Progress Notes (Signed)
[facility] Progress Note  Subjective: pt on vent , off sedation now since 6/6 - still not waking up; family aware of poor prognosis - going to watch until Monday.  HD this AM - BP down with UF.   Vitals:   05/07/20 0930 05/07/20 0933 05/07/20 0945 05/07/20 1000  BP: (!) 85/55 (!) 93/58 (!) 96/59 (!) 95/58  Pulse: 81 81 80 80  Resp: (!) 34 (!) 32 (!) 32 (!) 31  Temp:      TempSrc:      SpO2: 97% 95% 98% 97%  Weight:      Height:        Exam: Vent, no sedation, does not wake to voice or shoulder shake for me  no jvd  Chest cta bilat  Cor reg no RG  Abd soft ntnd no ascites   Ext +1 UE> trace LE edema   LUA aVF+bruit Qb 400   OP HD: TTS NW  4h  400/800  2/2 bath 72.5kg  Hep 2300   L AVF   - hect 7 ug  Assessment/ Plan: 1. Seizures/AMS - non-convulsive seizures per EEG, now off sedation, on keppra. Per neuro/ pmd. MRI negative, no evidence of PRES. Not uremia.  Neuro following - poor prognosis at this point as he's remained comatose.  Observe for improvement over weekend is the plan. 2. Fever/ pulm infiltrates - possible PNA improved. S/p ceftriaxone per short course.  3. Acute resp failure/ vol overload/ pulm edema - 6/5 CXR improved without edema, cont UF with HD --> he's reached euvolemia and with BPs down will hold on UF the rest of HD.   4. ESRD - on HD TTS outpt, HD today, hold over weekend unless pressing need.  Had rec'd 4 consecutive treatments earlier in week for volume 5. Secondary hyperPTH: cont outpt hectorol dose   Jannifer Hick MD [facility] Pager [phone]   Recent Labs  Lab 05/06/20 0302 05/06/20 0812 05/06/20 1745 05/07/20 0411  K  --    < > 6.2* 6.2*  6.2*  BUN  --    < > 124* 139*  141*  CREATININE  --    < > 5.87* 6.41*  6.48*  CALCIUM  --    < > 9.8 9.7  9.7  PHOS 6.3*   < > 7.6* 7.8*  7.8*  HGB 10.9*  --   --  10.4*   < > = values in this interval not displayed.   Inpatient medications: . amLODipine  5 mg  Per Tube Daily  . carvedilol  25 mg Per Tube BID WC  . chlorhexidine gluconate (MEDLINE KIT)  15 mL Mouth Rinse BID  . Chlorhexidine Gluconate Cloth  6 each Topical Q0600  . cloBAZam  5 mg Per Tube Daily  . docusate  100 mg Per Tube BID  . feeding supplement (PRO-STAT SUGAR FREE 64)  30 mL Per Tube TID  . heparin  5,000 Units Subcutaneous Q8H  . insulin aspart  0-9 Units Subcutaneous Q4H  . insulin detemir  22 Units Subcutaneous BID  . mouth rinse  15 mL Mouth Rinse 10 times per day  . metoCLOPramide (REGLAN) injection  10 mg Intravenous Q6H  . pantoprazole sodium  40 mg Per Tube QHS  . sorbitol  60 mL Per Tube Daily  . valproic acid  400 mg Per Tube TID   . sodium chloride Stopped (05/04/20 0207)  . feeding supplement (VITAL 1.5 CAL) 50 mL/hr at 05/06/20 1800  .  lacosamide (VIMPAT) IV Stopped (05/06/20 2229)  . levETIRAcetam Stopped (05/06/20 2115)  . [START ON 05/10/2020] levETIRAcetam     acetaminophen (TYLENOL) oral liquid 160 mg/5 mL, dextrose, docusate sodium, fentaNYL (SUBLIMAZE) injection, fentaNYL (SUBLIMAZE) injection, hydrALAZINE, polyethylene glycol

## 2020-05-08 ENCOUNTER — Inpatient Hospital Stay (HOSPITAL_COMMUNITY): Payer: Medicare Other

## 2020-05-08 DIAGNOSIS — G9389 Other specified disorders of brain: Secondary | ICD-10-CM

## 2020-05-08 LAB — RENAL FUNCTION PANEL
Albumin: 2.4 g/dL — ABNORMAL LOW (ref 3.5–5.0)
Albumin: 2.2 g/dL — ABNORMAL LOW (ref 3.5–5.0)
Anion gap: 19 — ABNORMAL HIGH (ref 5–15)
Anion gap: 19 — ABNORMAL HIGH (ref 5–15)
BUN: 99 mg/dL — ABNORMAL HIGH (ref 8–23)
BUN: 119 mg/dL — ABNORMAL HIGH (ref 8–23)
CO2: 24 mmol/L (ref 22–32)
CO2: 24 mmol/L (ref 22–32)
Calcium: 9.4 mg/dL (ref 8.9–10.3)
Calcium: 9.7 mg/dL (ref 8.9–10.3)
Chloride: 94 mmol/L — ABNORMAL LOW (ref 98–111)
Chloride: 94 mmol/L — ABNORMAL LOW (ref 98–111)
Creatinine, Ser: 5.25 mg/dL — ABNORMAL HIGH (ref 0.61–1.24)
Creatinine, Ser: 6.15 mg/dL — ABNORMAL HIGH (ref 0.61–1.24)
GFR calc Af Amer: 12 mL/min — ABNORMAL LOW (ref >60)
GFR calc Af Amer: 10 mL/min — ABNORMAL LOW (ref >60)
GFR calc non Af Amer: 11 mL/min — ABNORMAL LOW (ref >60)
GFR calc non Af Amer: 9 mL/min — ABNORMAL LOW (ref >60)
Glucose, Bld: 168 mg/dL — ABNORMAL HIGH (ref 70–99)
Glucose, Bld: 264 mg/dL — ABNORMAL HIGH (ref 70–99)
Phosphorus: 8.2 mg/dL — ABNORMAL HIGH (ref 2.5–4.6)
Phosphorus: 9.5 mg/dL — ABNORMAL HIGH (ref 2.5–4.6)
Potassium: 6 mmol/L — ABNORMAL HIGH (ref 3.5–5.1)
Potassium: 6.4 mmol/L (ref 3.5–5.1)
Sodium: 137 mmol/L (ref 135–145)
Sodium: 137 mmol/L (ref 135–145)

## 2020-05-08 LAB — GLUCOSE, CAPILLARY
Glucose-Capillary: 203 mg/dL — ABNORMAL HIGH (ref 70–99)
Glucose-Capillary: 148 mg/dL — ABNORMAL HIGH (ref 70–99)
Glucose-Capillary: 182 mg/dL — ABNORMAL HIGH (ref 70–99)
Glucose-Capillary: 221 mg/dL — ABNORMAL HIGH (ref 70–99)
Glucose-Capillary: 218 mg/dL — ABNORMAL HIGH (ref 70–99)
Glucose-Capillary: 220 mg/dL — ABNORMAL HIGH (ref 70–99)
Glucose-Capillary: 178 mg/dL — ABNORMAL HIGH (ref 70–99)

## 2020-05-08 LAB — POTASSIUM: Potassium: 6.1 mmol/L — ABNORMAL HIGH (ref 3.5–5.1)

## 2020-05-08 MED ORDER — DEXTROSE 50 % IV SOLN
1.0000 | Freq: Once | INTRAVENOUS | Status: AC
  Administered 2020-05-08: 50 mL via INTRAVENOUS
  Filled 2020-05-08: qty 50

## 2020-05-08 MED ORDER — SODIUM ZIRCONIUM CYCLOSILICATE 5 G PO PACK
10.0000 g | PACK | Freq: Once | ORAL | Status: AC
  Administered 2020-05-08: 10 g
  Filled 2020-05-08: qty 2

## 2020-05-08 MED ORDER — CALCIUM GLUCONATE-NACL 1-0.675 GM/50ML-% IV SOLN
1.0000 g | Freq: Once | INTRAVENOUS | Status: AC
  Administered 2020-05-08: 1000 mg via INTRAVENOUS
  Filled 2020-05-08: qty 50

## 2020-05-08 MED ORDER — ALBUTEROL SULFATE (2.5 MG/3ML) 0.083% IN NEBU
5.0000 mg | INHALATION_SOLUTION | Freq: Once | RESPIRATORY_TRACT | Status: AC
  Administered 2020-05-08: 5 mg via RESPIRATORY_TRACT
  Filled 2020-05-08: qty 6

## 2020-05-08 MED ORDER — INSULIN ASPART 100 UNIT/ML IV SOLN
10.0000 [IU] | Freq: Once | INTRAVENOUS | Status: AC
  Administered 2020-05-08: 10 [IU] via INTRAVENOUS

## 2020-05-08 NOTE — Procedures (Signed)
Patient Name: Jared [name].  MRN: [id number]  EEG Attending: [name]  Referring Physician/Provider: [name] Date: 05/08/2020 Duration: 22 minutes  Patient history: 64 year old male being evaluated for continued encephalopathy  Level of alertness: Comatose  AEDs during EEG study:   Technical aspects: This EEG study was done with scalp electrodes positioned according to the 10-20 International system of electrode placement. Electrical activity was acquired at a sampling rate of 500Hz  and reviewed with a high frequency filter of 70Hz  and a low frequency filter of 1Hz . EEG data were recorded continuously and digitally stored.   BACKGROUND ACTIVITY: The background consist predominately of generalized irregular delta range activity with a 10 Hz alpha range activity that is diffusely distributed, but with frontocentral predominance.  There is also a brief run of rhythmic delta without clear evolution.  EPILEPTIFORM ACTIVITY: Interictal epileptiform activity: None  Ictal Activity: None  OTHER EVENTS: None   ACTIVATION PROCEDURES:  Hyperventilation and photic stimulation were not performed.  IMPRESSION: This study is consistent with a generalized nonspecific cerebral dysfunction (encephalopathy). There was no seizure or evidence of seizure predisposition recorded on this study.   [name], MD [facility] [phone]  If 7pm- 7am, please page neurology on call as listed in [place].   If 7pm- 7am, please page neurology on call as listed in [place].

## 2020-05-08 NOTE — Progress Notes (Signed)
Potassium was 6.4  Calcium gluconate, dextrose and insulin, Lokelma  5 mg of albuterol nebulized  Repeat potassium 2100

## 2020-05-08 NOTE — Progress Notes (Signed)
NAME:  Jared [name], Jared [name] MRN:  [id number], DOB:  1956-05-02, LOS: 74 ADMISSION DATE:  05/11/2020, CONSULTATION DATE: 6/1 REFERRING MD: Dr. Tyrone Nine EDP, CHIEF COMPLAINT: Unresponsiveness  Brief History   64 year old male with ESRD on HD unresponsive from home.  Intubated in ED.  Has missed 2 dialysis sessions.  Hyperkalemic and volume overloaded in the ED.  Admitted to ICU for emergent hemodialysis.  Past Medical History   has a past medical history of Anemia, Cancer ([place]), Chronic kidney disease, Diabetes mellitus without complication ([place]), GERD (gastroesophageal reflux disease), Hypertension, Seizures ([place]), Stroke ([place]), and Thyroid disease.  Significant Hospital Events   6/1 admit for emergent dialysis. Intubated 6/2 Seizures 6/4 remains in status epilepticus, shock due to heavy sedation.  6/5 Bps improved, starting to wean sedation 6/6 sedation stopped  Consults:  Nephrology Neurology  Procedures:  ETT 6/1> R Fem CVC 6/3>6/7  Significant Diagnostic Tests:  CT head 6/1 > chronic microvascular ischemic changes, volume loss. No acute intracranial abnormalities CXR 6/2> improved pulmonary edema  MRI Brain 6/2> no acute intracranial abnormality, no findings suggestive of PRES.  CXR 6/3> lower lung volumes. 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 Bowel loops visible.  EEG 6/3 > subclinical seizures, multiple Overnight EEG  6/4 > seizures without clinical signs, average 6-8/ hour arising from left and right hemisphere, lasting 20 seconds on an average.   Micro Data:  All neg  Antimicrobials:  6/4>>6/7 rocephin  Interim history/subjective:  No events.  Less responsive today.  Objective   Blood pressure 119/65, pulse 87, temperature 99.2 F (37.3 C), temperature source Oral, resp. rate 18, height 5\' 8"  (1.727 m), weight 74.2 kg, SpO2 97 %.    Vent Mode: CPAP;PSV FiO2 (%):  [30 %] 30 % Set Rate:  [18 bmp] 18 bmp Vt Set:  [550 mL] 550 mL PEEP:  [5 cmH20]  5 cmH20 Pressure Support:  [10 cmH20-14 cmH20] 14 cmH20 Plateau Pressure:  [18 cmH20] 18 cmH20   Intake/Output Summary (Last 24 hours) at 05/08/2020 1029 Last data filed at 05/08/2020 1000 Gross per 24 hour  Intake 1265.04 ml  Output 1551 ml  Net -285.96 ml   Filed Weights   05/07/20 0645 05/07/20 1030 05/08/20 0500  Weight: 72.5 kg 71 kg 74.2 kg    Examination:   GEN: Middle aged appearing male on vent HEENT: [place]/AT, PERRL, no JVD  CV: S1-S2 appreciated PULM: Clear breath sounds anteriorly GI: hypoactive BS, soft EXT: LUE fistula + thrill NEURO: no response to pain, does trigger vent, no oculocephalic reflex, +corneal and pupillary reflex   K high- getting HD Potassium 6 this morning CBC benign  Resolved Hospital Problem list    Aspiration pneumonitis vs. Pneumonia- with fevers, leukocytosis, MRSA swab neg, tracheal aspirate neg - Short course of ceftriaxone reasonable, end date placed for 6/7 Hypotension related to sedation- resolved  Assessment & Plan:   Status epilepticus-MRI without evidence of PRES.  Has hx of seizures in past.   VEEG monitoring off. - AED per neurology (Keppra & VPA)  Comatose state- related to sedation, going to take a while to wake up given doses of sedation he required for seizure suppression. - Watch and wait. Discussed with family, will give until Monday until we make any big decisions.  They know patient would not want to live on life support like this. -Will continue to support pending family decisions  Acute hypoxic respiratory failure- due to inability to handle secretions, aspiration, volume overload -  fluid removal by HD - VAP prevention bundle - current sedation level precludes extubation - PS trials as able  ESRD on HD  - Continue iHD, keep him volume negative as able - Appreciate nephrology assistance.   DM, poorly controlled -A1c 9.6 - Levemir, SSI, better today  Hx prostate cancer-followed by [facility], do not have nubeqa  on formulary  Bowel regimen- was diarrhea now constipated, sorbitol and reglan trial  Best practice:  Diet: Tube feeds Pain/Anxiety/Delirium protocol (if indicated): none VAP protocol (if indicated): yes DVT prophylaxis: SQH GI prophylaxis: PPI Glucose control: SSI  + levemir  Mobility: BR Code Status: FULL  Family Communication: updated at length 6/10 Disposition: ICU  The patient is critically ill with multiple organ systems failure and requires high complexity decision making for assessment and support, frequent evaluation and titration of therapies, application of advanced monitoring technologies and extensive interpretation of multiple databases. Critical Care Time devoted to patient care services described in this note independent of APP/resident time (if applicable)  is 30 minutes.   Sherrilyn Rist MD [facility] Personal pager: [phone] If unanswered, please page CCM On-call: [phone]

## 2020-05-08 NOTE — Progress Notes (Signed)
Subjective: No significant changes.   Exam: Vitals:   05/08/20 0709 05/08/20 0723  BP:  117/66  Pulse:  92  Resp:    Temp: 99.2 F (37.3 C)   SpO2:     Gen: In bed, NAD Resp: non-labored breathing, no acute distress Abd: soft, nt  Neuro: MS: doe snot open eyes or follow commands. VD:IXVE corneal on the right, present on the left, PERRL, VOR intact Motor: flexion vs withdrawal in the RLE, other wise no movement to pain.  Sensory:as above.   Pertinent Labs: Cr 4.47 K 5.5  Impression: 64 yo with recurrent seizures in the setting of hyperglycemia who presented with essentially status epilepticus in the setting of hyperglycemia which was refractory to treatment requiring propofol and Versed.  He was on a dose of 50 mg/h up until June 6, but remains comatose. At this point, given the doses of versed that he received, I do feel that there could be some residual effect and would favor continued monitoring at the current.   Recommendations: 1) Continue keppra 1g q24 with additional 500mg  after dialysis.  2) Continue vimpat 100mg  q12h 3) Continue VPA 400mg  TID, recheck level in AM.  4) Will continue to monitor.   [name], MD [facility] [phone]  If 7pm- 7am, please page neurology on call as listed in [place].

## 2020-05-08 NOTE — Progress Notes (Signed)
EEG complete - results pending 

## 2020-05-08 NOTE — Progress Notes (Signed)
[facility] Progress Note  Subjective: pt on vent , off sedation now since 6/6 - still not waking up; family aware of poor prognosis - going to watch until Monday then continue the discussion.    Vitals:   05/08/20 0730 05/08/20 0800 05/08/20 0900 05/08/20 1000  BP:  117/67 125/65 119/65  Pulse: 92 88 88 87  Resp: 15 (!) _0 Temp:  99.2 F (37.3 C)    TempSrc:  Oral    SpO2: 100% 97% 97% 97%  Weight:      Height:        Exam: Vent, no sedation, does not wake to voice or shoulder shake for me  no jvd  Chest cta bilat  Cor reg no RG  Abd soft ntnd no ascites   Ext +1 UE> trace LE edema   LUA aVF+bruit   OP HD: TTS NW  4h  400/800  2/2 bath 72.5kg  Hep 2300   L AVF   - hect 7 ug  Assessment/ Plan: 1. Seizures/AMS - non-convulsive seizures per EEG, now off sedation, on keppra. Per neuro/ pmd. MRI negative, no evidence of PRES. Not uremia.  Neuro following - poor prognosis at this point as he's remained comatose.  Observe for improvement over weekend is the plan. 2. Fever/ pulm infiltrates - possible PNA improved. S/p ceftriaxone per short course.  3. Acute resp failure/ vol overload/ pulm edema - 6/5 CXR improved without edema, cont UF with HD  4. ESRD - on HD TTS outpt, HD Friday, hold over weekend unless pressing need.  Had rec'd 4 consecutive treatments earlier in week for volume 5. Secondary hyperPTH: cont outpt hectorol dose   Jannifer Hick MD [facility] Pager [phone]   Recent Labs  Lab 05/06/20 0302 05/06/20 7622 05/07/20 0411 05/07/20 0411 05/07/20 1749 05/08/20 0757  K  --    < > 6.2*  6.2*   < > 5.5* 6.0*  BUN  --    < > 139*  141*   < > 76* 99*  CREATININE  --    < > 6.41*  6.48*   < > 4.47* 5.25*  CALCIUM  --    < > 9.7  9.7   < > 9.5 9.4  PHOS 6.3*   < > 7.8*  7.8*   < > 6.8* 8.2*  HGB 10.9*  --  10.4*  --   --   --    < > = values in this interval not displayed.   Inpatient medications: . amLODipine  5 mg  Per Tube Daily  . carvedilol  25 mg Per Tube BID WC  . chlorhexidine gluconate (MEDLINE KIT)  15 mL Mouth Rinse BID  . Chlorhexidine Gluconate Cloth  6 each Topical Q0600  . docusate  100 mg Per Tube BID  . feeding supplement (PRO-STAT SUGAR FREE 64)  30 mL Per Tube TID  . heparin  5,000 Units Subcutaneous Q8H  . insulin aspart  0-9 Units Subcutaneous Q4H  . insulin detemir  22 Units Subcutaneous BID  . mouth rinse  15 mL Mouth Rinse 10 times per day  . metoCLOPramide (REGLAN) injection  10 mg Intravenous Q6H  . pantoprazole sodium  40 mg Per Tube QHS  . valproic acid  400 mg Per Tube TID   . sodium chloride 10 mL/hr at 05/08/20 0600  . feeding supplement (VITAL 1.5 CAL) 50 mL/hr at 05/08/20 0600  . lacosamide (VIMPAT) IV 100 mg (05/07/20  2310)  . levETIRAcetam 1,000 mg (05/07/20 2147)  . [START ON 05/10/2020] levETIRAcetam     acetaminophen (TYLENOL) oral liquid 160 mg/5 mL, dextrose, docusate sodium, fentaNYL (SUBLIMAZE) injection, fentaNYL (SUBLIMAZE) injection, hydrALAZINE, polyethylene glycol       

## 2020-05-08 NOTE — Progress Notes (Signed)
CRITICAL VALUE ALERT  Critical Value: K 6.4 Date & Time Notied:  05/08/20 at [place]  Provider Notified: Dr. Hermina Staggers  Orders Received/Actions taken: Ca gluconate, dextrose, 10 units of insulin, and repeat administration of lokalemia

## 2020-05-09 ENCOUNTER — Inpatient Hospital Stay (HOSPITAL_COMMUNITY): Payer: Medicare Other

## 2020-05-09 LAB — RENAL FUNCTION PANEL
Albumin: 2.3 g/dL — ABNORMAL LOW (ref 3.5–5.0)
Albumin: 2.3 g/dL — ABNORMAL LOW (ref 3.5–5.0)
Anion gap: 22 — ABNORMAL HIGH (ref 5–15)
Anion gap: 23 — ABNORMAL HIGH (ref 5–15)
BUN: 147 mg/dL — ABNORMAL HIGH (ref 8–23)
BUN: 160 mg/dL — ABNORMAL HIGH (ref 8–23)
CO2: 24 mmol/L (ref 22–32)
CO2: 22 mmol/L (ref 22–32)
Calcium: 9.7 mg/dL (ref 8.9–10.3)
Calcium: 9.7 mg/dL (ref 8.9–10.3)
Chloride: 92 mmol/L — ABNORMAL LOW (ref 98–111)
Chloride: 92 mmol/L — ABNORMAL LOW (ref 98–111)
Creatinine, Ser: 7.28 mg/dL — ABNORMAL HIGH (ref 0.61–1.24)
Creatinine, Ser: 7.58 mg/dL — ABNORMAL HIGH (ref 0.61–1.24)
GFR calc Af Amer: 8 mL/min — ABNORMAL LOW (ref >60)
GFR calc Af Amer: 8 mL/min — ABNORMAL LOW (ref >60)
GFR calc non Af Amer: 7 mL/min — ABNORMAL LOW (ref >60)
GFR calc non Af Amer: 7 mL/min — ABNORMAL LOW (ref >60)
Glucose, Bld: 241 mg/dL — ABNORMAL HIGH (ref 70–99)
Glucose, Bld: 230 mg/dL — ABNORMAL HIGH (ref 70–99)
Phosphorus: 9.3 mg/dL — ABNORMAL HIGH (ref 2.5–4.6)
Phosphorus: 9.4 mg/dL — ABNORMAL HIGH (ref 2.5–4.6)
Potassium: 6.8 mmol/L (ref 3.5–5.1)
Potassium: 7.1 mmol/L (ref 3.5–5.1)
Sodium: 138 mmol/L (ref 135–145)
Sodium: 137 mmol/L (ref 135–145)

## 2020-05-09 LAB — GLUCOSE, CAPILLARY
Glucose-Capillary: 215 mg/dL — ABNORMAL HIGH (ref 70–99)
Glucose-Capillary: 223 mg/dL — ABNORMAL HIGH (ref 70–99)
Glucose-Capillary: 293 mg/dL — ABNORMAL HIGH (ref 70–99)
Glucose-Capillary: 247 mg/dL — ABNORMAL HIGH (ref 70–99)
Glucose-Capillary: 237 mg/dL — ABNORMAL HIGH (ref 70–99)
Glucose-Capillary: 230 mg/dL — ABNORMAL HIGH (ref 70–99)

## 2020-05-09 LAB — CK: Total CK: 114 U/L (ref 49–397)

## 2020-05-09 LAB — VALPROIC ACID LEVEL: Valproic Acid Lvl: <10 ug/mL — ABNORMAL LOW (ref 50.0–100.0)

## 2020-05-09 MED ORDER — INSULIN ASPART 100 UNIT/ML IV SOLN
10.0000 [IU] | Freq: Once | INTRAVENOUS | Status: AC
  Administered 2020-05-09: 10 [IU] via INTRAVENOUS

## 2020-05-09 MED ORDER — SODIUM ZIRCONIUM CYCLOSILICATE 5 G PO PACK: 10.0000 g | PACK | Freq: Once | ORAL | Status: DC

## 2020-05-09 MED ORDER — SODIUM POLYSTYRENE SULFONATE 15 GM/60ML PO SUSP
30.0000 g | Freq: Once | ORAL | Status: AC
  Administered 2020-05-09: 30 g
  Filled 2020-05-09: qty 120

## 2020-05-09 MED ORDER — DEXTROSE 50 % IV SOLN
1.0000 | Freq: Once | INTRAVENOUS | Status: AC
  Administered 2020-05-09: 50 mL via INTRAVENOUS
  Filled 2020-05-09: qty 50

## 2020-05-09 MED ORDER — SODIUM ZIRCONIUM CYCLOSILICATE 5 G PO PACK
10.0000 g | PACK | Freq: Three times a day (TID) | ORAL | Status: AC
  Administered 2020-05-09 (×3): 10 g via ORAL
  Filled 2020-05-09 (×3): qty 2

## 2020-05-09 MED ORDER — ALBUTEROL SULFATE (2.5 MG/3ML) 0.083% IN NEBU
5.0000 mg | INHALATION_SOLUTION | Freq: Once | RESPIRATORY_TRACT | Status: AC
  Administered 2020-05-09: 5 mg via RESPIRATORY_TRACT
  Filled 2020-05-09: qty 6

## 2020-05-09 MED ORDER — SODIUM ZIRCONIUM CYCLOSILICATE 5 G PO PACK: 10.0000 g | PACK | Freq: Three times a day (TID) | ORAL | Status: DC

## 2020-05-09 MED ORDER — INSULIN ASPART 100 UNIT/ML ~~LOC~~ SOLN
0.0000 [IU] | SUBCUTANEOUS | Status: DC
  Administered 2020-05-09 (×2): 5 [IU] via SUBCUTANEOUS
  Administered 2020-05-09: 8 [IU] via SUBCUTANEOUS
  Administered 2020-05-09: 5 [IU] via SUBCUTANEOUS
  Administered 2020-05-10: 3 [IU] via SUBCUTANEOUS
  Administered 2020-05-10: 5 [IU] via SUBCUTANEOUS
  Administered 2020-05-10: 3 [IU] via SUBCUTANEOUS
  Administered 2020-05-10: 5 [IU] via SUBCUTANEOUS
  Administered 2020-05-10: 2 [IU] via SUBCUTANEOUS

## 2020-05-09 MED ORDER — CALCIUM GLUCONATE-NACL 1-0.675 GM/50ML-% IV SOLN
1.0000 g | Freq: Once | INTRAVENOUS | Status: AC
  Administered 2020-05-09: 1000 mg via INTRAVENOUS
  Filled 2020-05-09: qty 50

## 2020-05-09 NOTE — Progress Notes (Signed)
Subjective: No significant changes.   Exam: Vitals:   05/09/20 0753 05/09/20 0810  BP: 118/65   Pulse:    Resp:  18  Temp:    SpO2:  99%   Gen: In bed, NAD Resp: non-labored breathing, no acute distress Abd: soft, nt  Neuro: MS: does not open eyes or follow commands. HE:KBTC corneal on the right, present on the left, PERRL, VOR intact Motor: flexion vs withdrawal in the RLE, other wise no movement to pain.  Sensory:as above.   Pertinent Labs: VPA < 10  Impression: 64 yo with recurrent seizures in the setting of hyperglycemia who presented with essentially status epilepticus in the setting of hyperglycemia which was refractory to treatment requiring propofol and Versed.  He was on a dose of 50 mg/h up until June 6, but remains comatose despite a week off of sedation.   Recommendations: 1) Continue keppra 1g q24 with additional 500mg  after dialysis.  2) Continue vimpat 100mg  q12h 3) continue depakote for now 4) Will continue to monitor.   [name], MD [facility] [phone]  If 7pm- 7am, please page neurology on call as listed in [place].

## 2020-05-09 NOTE — Progress Notes (Signed)
NAME:  Jared [name], Ciccarelli MRN:  [id number], DOB:  [birth year], LOS: 44 ADMISSION DATE:  05/18/2020, CONSULTATION DATE: 6/1 REFERRING MD: Dr. Tyrone Nine EDP, CHIEF COMPLAINT: Unresponsiveness  Brief History   64 year old male with ESRD on HD unresponsive from home.  Intubated in ED.  Has missed 2 dialysis sessions.  Hyperkalemic and volume overloaded in the ED.  Admitted to ICU for emergent hemodialysis.  Past Medical History   has a past medical history of Anemia, Cancer ([place]), Chronic kidney disease, Diabetes mellitus without complication ([place]), GERD (gastroesophageal reflux disease), Hypertension, Seizures ([place]), Stroke ([place]), and Thyroid disease.  Significant Hospital Events   6/1 admit for emergent dialysis. Intubated 6/2 Seizures 6/4 remains in status epilepticus, shock due to heavy sedation.  6/5 Bps improved, starting to wean sedation 6/6 sedation stopped  Consults:  Nephrology Neurology  Procedures:  ETT 6/1> R Fem CVC 6/3>6/7  Significant Diagnostic Tests:  CT head 6/1 > chronic microvascular ischemic changes, volume loss. No acute intracranial abnormalities CXR 6/2> improved pulmonary edema  MRI Brain 6/2> no acute intracranial abnormality, no findings suggestive of PRES.  CXR 6/3> lower lung volumes. 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 Bowel loops visible.  EEG 6/3 > subclinical seizures, multiple Overnight EEG  6/4 > seizures without clinical signs, average 6-8/ hour arising from left and right hemisphere, lasting 20 seconds on an average.   Micro Data:  All neg  Antimicrobials:  6/4>>6/7 rocephin  Interim history/subjective:  No events. Remains unresponsive Issues with hyperkalemia  Objective   Blood pressure 118/65, pulse 92, temperature 99.4 F (37.4 C), temperature source Axillary, resp. rate 18, height 5\' 8"  (1.727 m), weight 73.9 kg, SpO2 99 %.    Vent Mode: PRVC FiO2 (%):  [30 %] 30 % Set Rate:  [18 bmp] 18 bmp Vt Set:  [550 mL]  550 mL PEEP:  [5 cmH20] 5 cmH20 Plateau Pressure:  [17 cmH20-22 cmH20] 17 cmH20   Intake/Output Summary (Last 24 hours) at 05/09/2020 0908 Last data filed at 05/09/2020 0600 Gross per 24 hour  Intake 965 ml  Output --  Net 965 ml   Filed Weights   05/07/20 1030 05/08/20 0500 05/09/20 0500  Weight: 71 kg 74.2 kg 73.9 kg    Examination:   GEN: 64 appearing male, on vent, unresponsive HEENT: [place]/AT, PERRL, no JVD  CV: S1-S2 appreciated PULM: Clear breath sounds GI: hypoactive BS, soft EXT: LUE fistula + thrill NEURO: no response to pain, breathing above vent  K high- getting HD, hyperkalemia being addressed Potassium 6.1 last p.m., repeat pending this morning CBC benign  Resolved Hospital Problem list    Aspiration pneumonitis vs. Pneumonia- with fevers, leukocytosis, MRSA swab neg, tracheal aspirate neg - Short course of ceftriaxone reasonable, end date placed for 6/7 Hypotension related to sedation- resolved  Assessment & Plan:   Status epilepticus-MRI without evidence of PRES.  Has hx of seizures in past.   VEEG monitoring off. - AED per neurology (Keppra & VPA)  Comatose state- related to sedation, going to take a while to wake up given doses of sedation he required for seizure suppression. - Watch and wait. Discussed with family, will give until Monday until we make any big decisions.  They know patient would not want to live on life support like this. -Will continue to support pending family decisions -Discussed with Dr. Leonel Ramsay this morning -Ongoing discussion regarding possible more sessions of dialysis to clear sedation  Acute hypoxic respiratory failure- due  to inability to handle secretions, aspiration, volume overload - fluid removal by HD - VAP prevention bundle - Current mental status precludes extubation - PS trials as able  ESRD on HD  - Continue iHD, keep him volume negative as able - Appreciate nephrology assistance.   DM, poorly  controlled -A1c 9.6 - Levemir, SSI, better today  Hx prostate cancer-followed by [facility], do not have nubeqa on formulary  Bowel regimen- was diarrhea now constipated, sorbitol and reglan trial  Best practice:  Diet: Tube feeds Pain/Anxiety/Delirium protocol (if indicated): none VAP protocol (if indicated): yes DVT prophylaxis: SQH GI prophylaxis: PPI Glucose control: SSI  + levemir  Mobility: BR Code Status: FULL  Family Communication: Will update Disposition: ICU  The patient is critically ill with multiple organ systems failure and requires high complexity decision making for assessment and support, frequent evaluation and titration of therapies, application of advanced monitoring technologies and extensive interpretation of multiple databases. Critical Care Time devoted to patient care services described in this note independent of APP/resident time (if applicable)  is 30 minutes.   Sherrilyn Rist MD [place] [facility] Personal pager: [phone] If unanswered, please page CCM On-call: [phone]

## 2020-05-09 NOTE — Progress Notes (Addendum)
[facility] Progress Note  Subjective: pt on vent , off sedation now since 6/6 - still not waking up; family aware of poor prognosis - going to watch until Monday then continue the discussion.    Vitals:   05/09/20 0600 05/09/20 0700 05/09/20 0753 05/09/20 0810  BP: 137/71  118/65   Pulse: 92     Resp:    18  Temp:  99.4 F (37.4 C)    TempSrc:  Axillary    SpO2: 97%   99%  Weight:      Height:        Exam: Vent, no sedation, does not wake to voice or shoulder shake for me  no jvd  Chest cta bilat  Cor reg no RG  Abd soft ntnd no ascites   Ext +1 UE> trace LE edema   LUA aVF+bruit   OP HD: TTS NW  4h  400/800  2/2 bath 72.5kg  Hep 2300   L AVF   - hect 7 ug  Assessment/ Plan: 1. Seizures/AMS - non-convulsive seizures per EEG, now off sedation, on keppra. Per neuro/ pmd. MRI negative, no evidence of PRES. Not uremia.  Neuro following - poor prognosis at this point as he's remained comatose.  Observe for improvement over weekend is the plan. Concern expressed that versed and metabolites could be causing his persistent comatose state.  It appears versed and metabolites are poorly cleared with HD so I don't plan to do additional dialysis today but will also verify with pharmacy.   2. Fever/ pulm infiltrates - possible PNA improved. S/p ceftriaxone per short course.  3. Acute resp failure/ vol overload/ pulm edema - 6/5 CXR improved without edema, cont UF with HD  4. ESRD - on HD TTS outpt, HD Friday, hold over weekend unless pressing need.  Had rec'd 4 consecutive treatments earlier in week for volume. 5. Secondary hyperPTH: cont outpt hectorol dose  6. Hyperkalemia:  Unclear etiology, tube feeds?. Give lokelma 10 TID today, check CK. Plan HD tomorrow 2K but if > 6 will do 1K for 1 hr.   Jannifer Hick MD [facility] Pager [phone]   Recent Labs  Lab 05/06/20 0302 05/06/20 2706 05/07/20 0411 05/07/20 1749 05/08/20 0757 05/08/20 0757  05/08/20 1752 05/08/20 2031  K  --    < > 6.2*  6.2*   < > 6.0*   < > 6.4* 6.1*  BUN  --    < > 139*  141*   < > 99*  --  119*  --   CREATININE  --    < > 6.41*  6.48*   < > 5.25*  --  6.15*  --   CALCIUM  --    < > 9.7  9.7   < > 9.4  --  9.7  --   PHOS 6.3*   < > 7.8*  7.8*   < > 8.2*  --  9.5*  --   HGB 10.9*  --  10.4*  --   --   --   --   --    < > = values in this interval not displayed.   Inpatient medications: . amLODipine  5 mg Per Tube Daily  . carvedilol  25 mg Per Tube BID WC  . chlorhexidine gluconate (MEDLINE KIT)  15 mL Mouth Rinse BID  . Chlorhexidine Gluconate Cloth  6 each Topical Q0600  . docusate  100 mg Per Tube BID  . feeding supplement (PRO-STAT  SUGAR FREE 64)  30 mL Per Tube TID  . heparin  5,000 Units Subcutaneous Q8H  . insulin aspart  0-15 Units Subcutaneous Q4H  . insulin detemir  22 Units Subcutaneous BID  . mouth rinse  15 mL Mouth Rinse 10 times per day  . pantoprazole sodium  40 mg Per Tube QHS  . sodium zirconium cyclosilicate  10 g Oral TID  . valproic acid  400 mg Per Tube TID   . sodium chloride 10 mL/hr at 05/08/20 0600  . feeding supplement (VITAL 1.5 CAL) 50 mL/hr at 05/08/20 2300  . lacosamide (VIMPAT) IV 100 mg (05/08/20 2218)  . levETIRAcetam 1,000 mg (05/08/20 2154)  . [START ON 05/10/2020] levETIRAcetam     acetaminophen (TYLENOL) oral liquid 160 mg/5 mL, dextrose, docusate sodium, fentaNYL (SUBLIMAZE) injection, fentaNYL (SUBLIMAZE) injection, hydrALAZINE, polyethylene glycol

## 2020-05-09 NOTE — Progress Notes (Signed)
Pt with rising K despite medical management.  Will plan for low K HD tonight.

## 2020-05-09 NOTE — Progress Notes (Signed)
Potassium continues to increase  Nurse paged nephrology, waiting to hear back  Order for [facility] Order for Kayexalate Calcium gluconate Insulin and D50 Albuterol nebulization   Needs to be dialyzed

## 2020-05-10 ENCOUNTER — Inpatient Hospital Stay (HOSPITAL_COMMUNITY): Payer: Medicare Other

## 2020-05-10 ENCOUNTER — Other Ambulatory Visit: Payer: Self-pay

## 2020-05-10 DIAGNOSIS — Z452 Encounter for adjustment and management of vascular access device: Secondary | ICD-10-CM

## 2020-05-10 DIAGNOSIS — R579 Shock, unspecified: Secondary | ICD-10-CM

## 2020-05-10 DIAGNOSIS — J9600 Acute respiratory failure, unspecified whether with hypoxia or hypercapnia: Secondary | ICD-10-CM

## 2020-05-10 LAB — CBC
HCT: 28.1 % — ABNORMAL LOW (ref 39.0–52.0)
HCT: 28.4 % — ABNORMAL LOW (ref 39.0–52.0)
Hemoglobin: 8.9 g/dL — ABNORMAL LOW (ref 13.0–17.0)
Hemoglobin: 8.9 g/dL — ABNORMAL LOW (ref 13.0–17.0)
MCH: 27.4 pg (ref 26.0–34.0)
MCH: 27.3 pg (ref 26.0–34.0)
MCHC: 31.7 g/dL (ref 30.0–36.0)
MCHC: 31.3 g/dL (ref 30.0–36.0)
MCV: 86.5 fL (ref 80.0–100.0)
MCV: 87.1 fL (ref 80.0–100.0)
Platelets: 331 10*3/uL (ref 150–400)
Platelets: 374 10*3/uL (ref 150–400)
RBC: 3.25 MIL/uL — ABNORMAL LOW (ref 4.22–5.81)
RBC: 3.26 MIL/uL — ABNORMAL LOW (ref 4.22–5.81)
RDW: 19.4 % — ABNORMAL HIGH (ref 11.5–15.5)
RDW: 19.6 % — ABNORMAL HIGH (ref 11.5–15.5)
WBC: 12.9 10*3/uL — ABNORMAL HIGH (ref 4.0–10.5)
WBC: 12.2 10*3/uL — ABNORMAL HIGH (ref 4.0–10.5)
nRBC: 0 % (ref 0.0–0.2)
nRBC: 0 % (ref 0.0–0.2)

## 2020-05-10 LAB — ECHOCARDIOGRAM COMPLETE
Height: 68 in
Weight: 2705.49 oz

## 2020-05-10 LAB — RENAL FUNCTION PANEL
Albumin: 2.2 g/dL — ABNORMAL LOW (ref 3.5–5.0)
Albumin: 2.2 g/dL — ABNORMAL LOW (ref 3.5–5.0)
Albumin: 2.2 g/dL — ABNORMAL LOW (ref 3.5–5.0)
Anion gap: 24 — ABNORMAL HIGH (ref 5–15)
Anion gap: 17 — ABNORMAL HIGH (ref 5–15)
Anion gap: 21 — ABNORMAL HIGH (ref 5–15)
BUN: 174 mg/dL — ABNORMAL HIGH (ref 8–23)
BUN: 74 mg/dL — ABNORMAL HIGH (ref 8–23)
BUN: 97 mg/dL — ABNORMAL HIGH (ref 8–23)
CO2: 25 mmol/L (ref 22–32)
CO2: 26 mmol/L (ref 22–32)
CO2: 23 mmol/L (ref 22–32)
Calcium: 9.6 mg/dL (ref 8.9–10.3)
Calcium: 9 mg/dL (ref 8.9–10.3)
Calcium: 9.3 mg/dL (ref 8.9–10.3)
Chloride: 91 mmol/L — ABNORMAL LOW (ref 98–111)
Chloride: 96 mmol/L — ABNORMAL LOW (ref 98–111)
Chloride: 95 mmol/L — ABNORMAL LOW (ref 98–111)
Creatinine, Ser: 8.01 mg/dL — ABNORMAL HIGH (ref 0.61–1.24)
Creatinine, Ser: 4.27 mg/dL — ABNORMAL HIGH (ref 0.61–1.24)
Creatinine, Ser: 5.14 mg/dL — ABNORMAL HIGH (ref 0.61–1.24)
GFR calc Af Amer: 7 mL/min — ABNORMAL LOW (ref >60)
GFR calc Af Amer: 16 mL/min — ABNORMAL LOW (ref >60)
GFR calc Af Amer: 13 mL/min — ABNORMAL LOW (ref >60)
GFR calc non Af Amer: 6 mL/min — ABNORMAL LOW (ref >60)
GFR calc non Af Amer: 14 mL/min — ABNORMAL LOW (ref >60)
GFR calc non Af Amer: 11 mL/min — ABNORMAL LOW (ref >60)
Glucose, Bld: 228 mg/dL — ABNORMAL HIGH (ref 70–99)
Glucose, Bld: 110 mg/dL — ABNORMAL HIGH (ref 70–99)
Glucose, Bld: 291 mg/dL — ABNORMAL HIGH (ref 70–99)
Phosphorus: 9.5 mg/dL — ABNORMAL HIGH (ref 2.5–4.6)
Phosphorus: 5.8 mg/dL — ABNORMAL HIGH (ref 2.5–4.6)
Phosphorus: 9.1 mg/dL — ABNORMAL HIGH (ref 2.5–4.6)
Potassium: 6.4 mmol/L (ref 3.5–5.1)
Potassium: 4 mmol/L (ref 3.5–5.1)
Potassium: 5.3 mmol/L — ABNORMAL HIGH (ref 3.5–5.1)
Sodium: 140 mmol/L (ref 135–145)
Sodium: 139 mmol/L (ref 135–145)
Sodium: 139 mmol/L (ref 135–145)

## 2020-05-10 LAB — GLUCOSE, CAPILLARY
Glucose-Capillary: 213 mg/dL — ABNORMAL HIGH (ref 70–99)
Glucose-Capillary: 143 mg/dL — ABNORMAL HIGH (ref 70–99)
Glucose-Capillary: 89 mg/dL (ref 70–99)
Glucose-Capillary: 190 mg/dL — ABNORMAL HIGH (ref 70–99)
Glucose-Capillary: 244 mg/dL — ABNORMAL HIGH (ref 70–99)
Glucose-Capillary: 195 mg/dL — ABNORMAL HIGH (ref 70–99)

## 2020-05-10 LAB — POCT I-STAT 7, (LYTES, BLD GAS, ICA,H+H)
Acid-Base Excess: 8 mmol/L — ABNORMAL HIGH (ref 0.0–2.0)
Bicarbonate: 31.1 mmol/L — ABNORMAL HIGH (ref 20.0–28.0)
Calcium, Ion: 1.12 mmol/L — ABNORMAL LOW (ref 1.15–1.40)
HCT: 28 % — ABNORMAL LOW (ref 39.0–52.0)
Hemoglobin: 9.5 g/dL — ABNORMAL LOW (ref 13.0–17.0)
O2 Saturation: 100 %
Patient temperature: 98.9
Potassium: 4.1 mmol/L (ref 3.5–5.1)
Sodium: 136 mmol/L (ref 135–145)
TCO2: 32 mmol/L (ref 22–32)
pCO2 arterial: 38.1 mmHg (ref 32.0–48.0)
pH, Arterial: 7.521 — ABNORMAL HIGH (ref 7.350–7.450)
pO2, Arterial: 249 mmHg — ABNORMAL HIGH (ref 83.0–108.0)

## 2020-05-10 LAB — CORTISOL: Cortisol, Plasma: 12 ug/dL

## 2020-05-10 MED ORDER — SODIUM CHLORIDE 0.9 % IV SOLN: 250.0000 mL | INTRAVENOUS | Status: DC

## 2020-05-10 MED ORDER — EPINEPHRINE HCL 5 MG/250ML IV SOLN IN NS
0.5000 ug/min | INTRAVENOUS | Status: DC
  Administered 2020-05-10: 0.501 ug/min via INTRAVENOUS
  Filled 2020-05-10: qty 250

## 2020-05-10 MED ORDER — ATROPINE SULFATE 1 MG/10ML IJ SOSY
PREFILLED_SYRINGE | INTRAMUSCULAR | Status: AC
  Administered 2020-05-10: 1 mg
  Filled 2020-05-10: qty 10

## 2020-05-10 MED ORDER — LABETALOL HCL 5 MG/ML IV SOLN: 20.0000 mg | Freq: Once | INTRAVENOUS | Status: AC

## 2020-05-10 MED ORDER — NOREPINEPHRINE 4 MG/250ML-% IV SOLN
2.0000 ug/min | INTRAVENOUS | Status: DC
  Administered 2020-05-10: 10 ug/min via INTRAVENOUS

## 2020-05-10 MED ORDER — NEPRO/CARBSTEADY PO LIQD
1000.0000 mL | ORAL | Status: DC
  Administered 2020-05-10 – 2020-05-11 (×2): 1000 mL via ORAL
  Filled 2020-05-10 (×3): qty 1000

## 2020-05-10 MED ORDER — LABETALOL HCL 5 MG/ML IV SOLN: 10.0000 mg | INTRAVENOUS | Status: DC | PRN

## 2020-05-10 MED ORDER — AMLODIPINE BESYLATE 10 MG PO TABS: 10.0000 mg | ORAL_TABLET | Freq: Every day | ORAL | Status: DC

## 2020-05-10 MED ORDER — "THROMBI-PAD 3""X3"" EX PADS"
1.0000 | MEDICATED_PAD | Freq: Once | CUTANEOUS | Status: DC
  Filled 2020-05-10: qty 1

## 2020-05-10 MED ORDER — HYDROCORTISONE NA SUCCINATE PF 100 MG IJ SOLR
50.0000 mg | Freq: Four times a day (QID) | INTRAMUSCULAR | Status: DC
  Administered 2020-05-10: 50 mg via INTRAVENOUS
  Filled 2020-05-10: qty 2

## 2020-05-10 MED ORDER — SODIUM CHLORIDE 0.9% FLUSH
10.0000 mL | Freq: Two times a day (BID) | INTRAVENOUS | Status: DC
  Administered 2020-05-10 – 2020-05-12 (×4): 10 mL

## 2020-05-10 MED ORDER — NOREPINEPHRINE 4 MG/250ML-% IV SOLN
INTRAVENOUS | Status: AC
  Filled 2020-05-10: qty 250

## 2020-05-10 MED ORDER — HEPARIN SODIUM (PORCINE) 1000 UNIT/ML DIALYSIS: 2000.0000 [IU] | Freq: Once | INTRAMUSCULAR | Status: AC

## 2020-05-10 MED ORDER — CHLORHEXIDINE GLUCONATE CLOTH 2 % EX PADS
6.0000 | MEDICATED_PAD | Freq: Every day | CUTANEOUS | Status: DC
  Administered 2020-05-11 – 2020-05-12 (×2): 6 via TOPICAL

## 2020-05-10 MED ORDER — HEPARIN SODIUM (PORCINE) 1000 UNIT/ML IJ SOLN
INTRAMUSCULAR | Status: AC
  Administered 2020-05-10: 2000 [IU] via INTRAVENOUS_CENTRAL
  Filled 2020-05-10: qty 2

## 2020-05-10 MED ORDER — LABETALOL HCL 5 MG/ML IV SOLN
INTRAVENOUS | Status: AC
  Filled 2020-05-10: qty 4

## 2020-05-10 MED ORDER — NOREPINEPHRINE 16 MG/250ML-% IV SOLN
0.0000 ug/min | INTRAVENOUS | Status: DC
  Filled 2020-05-10: qty 250

## 2020-05-10 MED ORDER — SODIUM CHLORIDE 0.9% FLUSH: 10.0000 mL | INTRAVENOUS | Status: DC | PRN

## 2020-05-10 MED ORDER — SODIUM CHLORIDE 0.9 % IV BOLUS
1000.0000 mL | Freq: Once | INTRAVENOUS | Status: AC
  Administered 2020-05-10: 1000 mL via INTRAVENOUS

## 2020-05-10 MED ORDER — LABETALOL HCL 5 MG/ML IV SOLN
INTRAVENOUS | Status: AC
  Administered 2020-05-10: 20 mg
  Filled 2020-05-10: qty 4

## 2020-05-10 NOTE — Procedures (Signed)
Arterial Catheter Insertion Procedure Note Shahir Karen [id number] 04-23-1956  Procedure: Insertion of Arterial Catheter  Indications: Blood pressure monitoring  Procedure Details Consent: Unable to obtain consent because of emergent medical necessity. Time Out: Verified patient identification, verified procedure, site/side was marked, verified correct patient position, special equipment/implants available, medications/allergies/relevent history reviewed, required imaging and test results available.  Performed  Maximum sterile technique was used including antiseptics, cap, gloves, gown, hand hygiene, mask and sheet. Skin prep: Chlorhexidine; local anesthetic administered 20 gauge catheter was inserted into left femoral artery using the Seldinger technique. ULTRASOUND GUIDANCE USED: YES Evaluation Blood flow good; BP tracing good. Complications: No apparent complications.   Clementeen Graham 05/10/2020 Erick Colace ACNP-BC [place] Pager # [phone] OR # [phone] if no answer

## 2020-05-10 NOTE — Progress Notes (Addendum)
Subjective: No acute events overnight.  Became hypotensive and bradycardic requiring atropine and epinephrine during hemodialysis, session was cut short.  Continues to be comatose. Sisters and one sister's husband at bedside.   ROS: Unable to obtain due to poor mental status  Examination  Vital signs in last 24 hours: Temp:  [98.4 F (36.9 C)-99.2 F (37.3 C)] 98.9 F (37.2 C) (06/14 0719) Pulse Rate:  [50-107] 81 (06/14 0930) Resp:  [14-25] 14 (06/14 0930) BP: (40-177)/(25-103) 84/60 (06/14 0930) SpO2:  [66 %-100 %] 99 % (06/14 0930) Arterial Line BP: (29-111)/(15-57) 94/43 (06/14 0930) FiO2 (%):  [30 %] 30 % (06/14 0822) Weight:  [76.7 kg] 76.7 kg (06/14 0530)  General: lying in bed,not in apparent distress CVS: pulse-normal rate and rhythm RS: breathing comfortably,intubated Extremities: normal,warm  Neuro: SW:NIOEVOJJ, does not open eyes to noxious stimuli KK:XFGHWE sluggishly reacting to light,corneal reflex absent, gag reflex absent Motor: No movement to noxious stimuli in all extremities  Basic Metabolic Panel: Recent Labs  Lab 05/04/20 0253 05/04/20 0833 05/05/20 0507 05/05/20 0814 05/06/20 0302 05/06/20 9937 05/07/20 0411 05/07/20 1749 05/08/20 0757 05/08/20 0757 05/08/20 1752 05/08/20 1752 05/08/20 2031 05/09/20 0939 05/09/20 1743 05/10/20 0233 05/10/20 0907  NA  --    < >  --    < >  --    < > 138  138   < > 137   < > 137  --   --  138 137 140 136  K  --    < >  --    < >  --    < > 6.2*  6.2*   < > 6.0*   < > 6.4*   < > 6.1* 6.8* 7.1* 6.4* 4.1  CL  --    < >  --    < >  --    < > 95*  94*   < > 94*  --  94*  --   --  92* 92* 91*  --   CO2  --    < >  --    < >  --    < > 23  23   < > 24  --  24  --   --  24 22 25   --   GLUCOSE  --    < >  --    < >  --    < > 158*  161*   < > 168*  --  264*  --   --  241* 230* 228*  --   BUN  --    < >  --    < >  --    < > 139*  141*   < > 99*  --  119*  --   --  147* 160* 174*  --   CREATININE  --     < >  --    < >  --    < > 6.41*  6.48*   < > 5.25*  --  6.15*  --   --  7.28* 7.58* 8.01*  --   CALCIUM  --    < >  --    < >  --    < > 9.7  9.7   < > 9.4   < > 9.7   < >  --  9.7 9.7 9.6  --   MG 2.3  --  2.7*  --  2.5*  --  2.8*  --   --   --   --   --   --   --   --   --   --  PHOS 4.6   < > 5.8*   < > 6.3*   < > 7.8*  7.8*   < > 8.2*  --  9.5*  --   --  9.3* 9.4* 9.5*  --    < > = values in this interval not displayed.    CBC: Recent Labs  Lab 05/04/20 0253 05/04/20 0253 05/05/20 0507 05/06/20 0302 05/07/20 0411 05/10/20 0233 05/10/20 0907  WBC 5.2  --  6.6 10.5 11.6* 12.9*  --   HGB 11.2*   < > 10.3* 10.9* 10.4* 8.9* 9.5*  HCT 35.6*   < > 32.8* 34.0* 32.8* 28.1* 28.0*  MCV 86.0  --  86.1 85.6 85.9 86.5  --   PLT 166  --  164 201 232 331  --    < > = values in this interval not displayed.     Coagulation Studies: No results for input(s): LABPROT, INR in the last 72 hours.  Imaging No new brain imaging overnight  ASSESSMENT AND PLAN:64 year old male with end-stage renal disease on hemodialysis who was admitted on 6/1 after presenting with hyperkalemia, acute hypoxic failure and pulmonary edema from volume overload in the setting of missed hemodialysis x2. EEG was obtained as part of encephalopathy work-up and showed 7 seizures without clinical signs arising from left hemisphere. Patient was started on long-term EEG and had multiple seizures (average 6 to 8/hr) arising from left and right hemisphere. Keppra dose was increased and Depakote was added. However seizures persisted and therefore patient was started on Versed and eventually propofol was added.Patient was then started on Vimpat and finally Onfi was added after which seizures stopped. Patient was gradually weaned off of propofol followed by Versed. Last dose of Versed on 05/02/2020 at around 7 AM.  Nonconvulsive seizures, left and right hemisphere(resolved) Epilepsy, left and right hemisphere End-stage  renal disease on hemodialysis -Etiology for seizures: Patient's Keppra was stopped, possible toxicity from missing hemodialysis  Recommendations - Continue valproic acid at 4 mg 3 times daily, Keppra 1000 mg twice dailywith an additional 500 mg dose after hemodialysis (renally adjusted therefore cannot increase dose any further),Vimpat 100 mg twice daily.  -Patient continues to be comatose and exam appears worse than the weekend.  Although partly this could be because of patient's elevated BUN, I am concerned that patient may have suffered irreversible neurologic injury.  Informed family that at this point its unlikely that patient will have any significant neurologic recovery. Plan for family meeting with ICU team later today.  - Management of rest of comorbidities per primary team.  I have spent a total of  35 minutes with the patient reviewing hospital notes,  test results, labs and examining the patient as well as establishing an assessment and plan that was discussed personally with the patient's family at bedside.  > 50% of time was spent in direct patient care.   Zeb Comfort Epilepsy [facility] For questions after 5pm please refer to AMION to reach the Neurologist on call

## 2020-05-10 NOTE — Progress Notes (Signed)
Patient became bradycardiac 40's and SBP decreased to 40's during HD Atropine given IV and Levophed started per Jared Griffon NP.

## 2020-05-10 NOTE — Progress Notes (Signed)
Noted 6.4 potassium level, notified E-link of result and made aware that dialysis is being set up at this time at the bedside.

## 2020-05-10 NOTE — Progress Notes (Signed)
Long d/w family in person and via tele conference. All questions answered. We discussed today's events. Concern overall that he has not woken up and that the longer he does not the more likely he will not regain normal LOC. We discussed the fact that he did not tolerate iHD today. Also discussed potential treatment options depending on next couple of days.   Option 1) if he does not tolerate HD then we would be looking at failing iHD twice now. At that point there would be nothing further to offer and family is in agreement to work towards w/d of care & comfort. They understand he would not survive without this level of support.  Option 2) if he tolerates iHD then the next question is to decide how long we continue support. The family feels like they need more time for prayer and to give him more time supported to see if there is a chance he may wake up still. They do not think he would want to live long term in SNF and specifically felt that if he had still no improvement in 4 weeks that they would feel satisfied that they had done all they could do and at that point we would work towards w/d of care.  In the mean time we did discuss what that would mean specifically -trach -prob PEG -LTAC  They are all in agreement that this would be the way they would like to go short term. Again should he get to 4 weeks after this point and still no improvement then they would then want to transition to comfort.  Erick Colace ACNP-BC [place] Pager # [phone] OR # [phone] if no answer

## 2020-05-10 NOTE — Progress Notes (Addendum)
NAME:  Jared [name], Apfel MRN:  [id number], DOB:  1956/11/20, LOS: 31 ADMISSION DATE:  05/14/2020, CONSULTATION DATE: 6/1 REFERRING MD: Dr. Tyrone Nine EDP, CHIEF COMPLAINT: Unresponsiveness  Brief History   64 year old male with ESRD on HD unresponsive from home.  Intubated in ED.  Has missed 2 dialysis sessions.  Hyperkalemic and volume overloaded in the ED.  Admitted to ICU for emergent hemodialysis.  Past Medical History   has a past medical history of Anemia, Cancer ([place]), Chronic kidney disease, Diabetes mellitus without complication ([place]), GERD (gastroesophageal reflux disease), Hypertension, Seizures ([place]), Stroke ([place]), and Thyroid disease.  Significant Hospital Events   6/1 admit for emergent dialysis. Intubated 6/2 Seizures 6/4 remains in status epilepticus, shock due to heavy sedation.  6/5 Bps improved, starting to wean sedation 6/6 sedation stopped 6/14 bradycardia and refractory shock during HD. Required rapid titration of norepi; addition of Epi and stopped HD   Consults:  Nephrology Neurology  Procedures:  ETT 6/1> R Fem CVC 6/3>6/7 Left fem CVL 6/14>>> Left fem aline 6/14>>>  Significant Diagnostic Tests:  CT head 6/1 > chronic microvascular ischemic changes, volume loss. No acute intracranial abnormalities CXR 6/2> improved pulmonary edema  MRI Brain 6/2> no acute intracranial abnormality, no findings suggestive of PRES.  CXR 6/3> lower lung volumes. 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 Bowel loops visible.  EEG 6/3 > subclinical seizures, multiple Overnight EEG  6/4 > seizures without clinical signs, average 6-8/ hour arising from left and right hemisphere, lasting 20 seconds on an average.   Micro Data:  All neg  Antimicrobials:  6/4>>6/7 rocephin  Interim history/subjective:  Hyperkalemic yesterday. Developed sudden bradycardia during HD had to abort dialysis. Required rapid escalation of pressors   Objective   Blood pressure  (Abnormal) 69/45, pulse 73, temperature 98.9 F (37.2 C), temperature source Axillary, resp. rate 18, height 5\' 8"  (1.727 m), weight 76.7 kg, SpO2 100 %.    Vent Mode: PRVC FiO2 (%):  [30 %] 30 % Set Rate:  [18 bmp] 18 bmp Vt Set:  [550 mL] 550 mL PEEP:  [5 cmH20] 5 cmH20 Plateau Pressure:  [17 cmH20-20 cmH20] 19 cmH20   Intake/Output Summary (Last 24 hours) at 05/10/2020 0915 Last data filed at 05/10/2020 0800 Gross per 24 hour  Intake 1180 ml  Output 60 ml  Net 1120 ml   Filed Weights   05/09/20 0500 05/10/20 0500 05/10/20 0530  Weight: 73.9 kg 76.7 kg 76.7 kg    Examination:   General this is a chronically ill appearing 64 year old black male. He remains unresponsive. Currently BP trending up after stopping HD and rapid titration of pressors HENT NCAT sclera are not icteric. He is orally intubated  Card RRR; bradycardic as low as 40s, now HR 84 on high dose norepi  Pulm equal chest rise bilaterally  Ext cool weak pulses. Edema dependent left UE fistula functional  abd soft. Liq stool w/ flexiseal  gu anuric  Neuro comatose and unresponsive   Resolved Hospital Problem list    Aspiration pneumonitis vs. Pneumonia- with fevers, leukocytosis, MRSA swab neg, tracheal aspirate neg - Short course of ceftriaxone reasonable, end date placed for 6/7 Hypotension related to sedation- resolved  Assessment & Plan:   Comatose state s/p prolonged Status epilepticus; s/p burst suppression therapy-MRI without evidence of PRES.  Has hx of seizures in past. ->anticipate it will take several days to weeks if he is to improve neurologically.  Plan Cont AEDs (keppra and  VPA) Supportive care  Acute hypoxic respiratory failure- due to inability to handle secretions, aspiration, volume overload pcxr from 6/13 w/ right basilar atx.  Plan Full vent support VAP bundle Ve adjusted  PAD protocol   Circulatory shock. W/ symptomatic bradycardia during HD.  -initially though vagal response to  sxning but proved to not be the case. Acidosis ruled out. Pressors rapidly titrated up but did not stabilize until flow stopped on HD Plan Cont to titrate norepi for SBP > 100 Ck cortisol; start stress dose steroids  His last echo was in 2018. Will repeat. If he cannot tolerate iHD I think we need to consider goals of care   ESRD on HD w/ on-going hyperkalemia  -developed acute refractory shock during HD today  -stopped 2.5 hrs into therapy.  Plan Repeat bmp IF we are going to continue metabolic support he would need CRRT   DM, poorly controlled -A1c 9.6 Plan ssi and levemir   Hx prostate cancer-followed by [facility], do not have nubeqa on formulary Plan F/u if makes recovery   Anemia of chronic disease Plan Cont to trend cbc   Best practice:  Diet: Tube feeds Pain/Anxiety/Delirium protocol (if indicated): none VAP protocol (if indicated): yes DVT prophylaxis: SQH GI prophylaxis: PPI Glucose control: SSI  + levemir  Mobility: BR Code Status: FULL -->will discuss goals of care w/ family. He apparently had shared w/ our team that he would not want prolonged life support. If indeed he cannot tolerate iHD then prognostically his chances of survival are essentially zero. Will get echo, ck cortisol, ensure no acute blood loss since early this am. Family is on way. Will discuss options at that point.  Family Communication: Will update Disposition: ICU  cct 45 minutes  Erick Colace ACNP-BC [place] Pager # [phone] OR # [phone] if no answer

## 2020-05-10 NOTE — Procedures (Signed)
Central Venous Catheter Insertion Procedure Note Capers Hagmann [id number] [birth year]  Procedure: Insertion of Central Venous Catheter Indications: Drug and/or fluid administration  Procedure Details Consent: Unable to obtain consent because of emergent medical necessity. Time Out: Verified patient identification, verified procedure, site/side was marked, verified correct patient position, special equipment/implants available, medications/allergies/relevent history reviewed, required imaging and test results available.  Performed  Maximum sterile technique was used including antiseptics, cap, gloves, gown, hand hygiene, mask and sheet. Skin prep: Chlorhexidine; local anesthetic administered A antimicrobial bonded/coated triple lumen catheter was placed in the left femoral vein due to emergent situation using the Seldinger technique.  Evaluation Blood flow good Complications: No apparent complications Patient did tolerate procedure well. Chest X-ray ordered to verify placement.  CXR: not needed.  Clementeen Graham 05/10/2020, 9:35 AM  Erick Colace ACNP-BC [place] Pager # [phone] OR # [phone] if no answer

## 2020-05-10 NOTE — Progress Notes (Signed)
[facility] Progress Note  Subjective: had 2-3 hrs HD this am c/b severe hypotension requiring pressors, pt was taken off. Echo bedside per MD showing RV collapse  Vitals:   05/10/20 1300 05/10/20 1315 05/10/20 1330 05/10/20 1345  BP: (!) 175/83 (!) 150/73 (!) 161/78 (!) 165/80  Pulse: 84 68 79 79  Resp: _0 Temp:      TempSrc:      SpO2: 100% 100% 100% 99%  Weight:      Height:        Exam: Vent, no sedation, does not wake to voice  no jvd  Chest cta bilat  Cor reg no RG  Abd soft ntnd no ascites   Ext +1 UE> no LE edema   LUA aVF+bruit   OP HD: TTS NW  4h  400/800  2/2 bath 72.5kg  Hep 2300   L AVF   - hect 7 ug   CXR 6.13 - focus of apparent pneumonia right base. Atelectasis left base. Lungs elsewhere clear. Stable cardiac silhouette.  Assessment/ Plan: 1. Seizures/AMS - non-convulsive seizures per EEG, now off sedation, on keppra and vimpat IV and po valproic acid per tube.  Per neuro/ pmd. MRI negative, no evidence of PRES. Not uremia.  Neuro following - poor prognosis at this point as he's remained comatose.   Concern expressed that versed and metabolites could be causing his persistent comatose state. Had some HD today but shortened as below, will plan HD again tomorrow due to [facility] and to get back on TTS schedule.  2. Fever/ pulm infiltrates - possible PNA improved. S/p ceftriaxone per short course.  3. Acute resp failure/ vol overload/ pulm edema - 6/5 CXR improved without edema after extra HD last week. Got down to edw, but now is up 2-4kg by wts. Did not tolerate 1 L UF today w/ severe hypotension.  Per CCM echo showing RV collapse c/w vol depletion most likely. No UF w/ next HD.  Remains on vent 4. ESRD - on HD TTS outpt, HD Friday, had extra HD last week for vol overload. Had some this am but due to severe hypotension HD was aborted. Plan HD tomorrow as above.  5. Secondary hyperPTH: cont outpt hectorol dose 6. Hyperkalemia:  Unclear  etiology, tube feeds?Hanley Seamen lokelma 10 TID yest. K+ better after HD. Recheck in am.     Jared [name] 05/10/2020, 1:59 PM   Recent Labs  Lab 05/10/20 0233 05/10/20 0233 05/10/20 0907 05/10/20 1004 05/10/20 1047  K 6.4*   < > 4.1 4.0  --   BUN 174*  --   --  74*  --   CREATININE 8.01*  --   --  4.27*  --   CALCIUM 9.6  --   --  9.0  --   PHOS 9.5*  --   --  5.8*  --   HGB 8.9*   < > 9.5*  --  8.9*   < > = values in this interval not displayed.   Inpatient medications: . [START ON 05/11/2020] amLODipine  10 mg Per Tube Daily  . carvedilol  25 mg Per Tube BID WC  . chlorhexidine gluconate (MEDLINE KIT)  15 mL Mouth Rinse BID  . Chlorhexidine Gluconate Cloth  6 each Topical Q0600  . docusate  100 mg Per Tube BID  . feeding supplement (PRO-STAT SUGAR FREE 64)  30 mL Per Tube TID  . heparin  5,000 Units Subcutaneous Q8H  . insulin  aspart  0-15 Units Subcutaneous Q4H  . insulin detemir  22 Units Subcutaneous BID  . labetalol      . mouth rinse  15 mL Mouth Rinse 10 times per day  . pantoprazole sodium  40 mg Per Tube QHS  . valproic acid  400 mg Per Tube TID   . sodium chloride 500 mL/hr at 05/10/20 1011  . sodium chloride    . feeding supplement (NEPRO CARB STEADY) 1,000 mL (05/10/20 1314)  . lacosamide (VIMPAT) IV Stopped (05/10/20 1119)  . levETIRAcetam Stopped (05/09/20 2237)  . levETIRAcetam    . norepinephrine     acetaminophen (TYLENOL) oral liquid 160 mg/5 mL, dextrose, docusate sodium, fentaNYL (SUBLIMAZE) injection, fentaNYL (SUBLIMAZE) injection, hydrALAZINE, polyethylene glycol

## 2020-05-10 NOTE — Progress Notes (Signed)
°  Echocardiogram 2D Echocardiogram has been performed.  Jared [name] 05/10/2020, 5:06 PM

## 2020-05-10 NOTE — Progress Notes (Signed)
Nutrition Follow-up  DOCUMENTATION CODES:   Not applicable  INTERVENTION:   Tube feeding via OG tube: - Change to Nepro @ 35 ml/hr (840 ml/day) - Continue Pro-stat 30 ml TID  Tube feeding regimen provides 1812 kcal, 113 grams of protein, and 611 ml of H2O.   NUTRITION DIAGNOSIS:   Inadequate oral intake related to inability to eat as evidenced by NPO status.  Ongoing, being addressed via TF  GOAL:   Patient will meet greater than or equal to 90% of their needs  Met via TF  MONITOR:   Vent status, Labs, Weight trends, TF tolerance, I & O's  REASON FOR ASSESSMENT:   Ventilator, Consult Enteral/tube feeding initiation and management  ASSESSMENT:   64 yo male admitted with hyperkalemia, volume overload, acute metabolic encephalopathy after missing two dialysis sessions. Required intubation and emergent HD on admission. PMH includes HTN, thyroid disease, DM, stroke, ESRD on HD, hypoglycemic seizure, prostate cancer, GERD.  6/02 - developed seizures 6/06 - sedation stopped  Discussed pt with RN and during ICU rounds. Pt with bradycardia and refractory shock on HD this AM and required pressors which are now off. HD stopped.  OG tube remains in place with Vital 1.5 formula infusing. Given persistent hyperkalemia and hyperphosphatemia, will change TF formula to Nepro.  Weight up 10 lbs since admission. EDW is 72.5 kg per Nephrology.  Current TF: Vital 1.5 @ 50 ml/hr, Pro-stat 30 ml TID  Patient remains intubated on ventilator support. MV: 10.4 L/min Temp (24hrs), Avg:99 F (37.2 C), Min:98.6 F (37 C), Max:99.2 F (37.3 C) BP (a-line): 205/71 MAP (a-line): 113  Drips: Epinephrine: off now Levophed: off now NS  Medications reviewed and include: colace, solu-cortef, SSI q 4 hours, Levemir 22 units BID, protonix, Vimpat, Keppra  Labs reviewed: phosphorus 9.5, ionized calcium 1.12, potassium 4.1 (WNL), hemoglobin 8.9 CBG's: 89-247 x 24 hours  Rectal tube: 60 ml  x 24 hours HD net UF on 6/14: 1270 ml  Diet Order:   Diet Order            Diet NPO time specified  Diet effective now                 EDUCATION NEEDS:   Not appropriate for education at this time  Skin:  Skin Assessment: Reviewed RN Assessment  Last BM:  05/10/20 rectal tube  Height:   Ht Readings from Last 1 Encounters:  05/24/2020 '5\' 8"'$  (1.727 m)    Weight:   Wt Readings from Last 1 Encounters:  05/10/20 76.7 kg   EDW: 72.5 kg  Ideal Body Weight:  70 kg  BMI:  Body mass index is 25.71 kg/m.  Estimated Nutritional Needs:   Kcal:  5701  Protein:  110-140 gm  Fluid:  UOP + 1 L    Gaynell Face, MS, RD, LDN Inpatient Clinical Dietitian Pager: [phone] Weekend/After Hours: [phone]

## 2020-05-10 NOTE — Progress Notes (Signed)
[place] Progress Note Patient Name: Jared [name]. DOB: May 14, 1956 MRN: [id number]   Date of Service  05/10/2020  HPI/Events of Note  Oozing from L femoral A-line and CVL.  eICU Interventions  Plan: 1. Apply Thrombi-pad.     Intervention Category Major Interventions: Other:  Lysle Dingwall 05/10/2020, 11:59 PM

## 2020-05-10 NOTE — Progress Notes (Signed)
Spoke with CCM NP Kary Kos about high BP despite 40 IVP of hydralazine and 5mg  of norvasc given thru OGT. Stated he wanted to wait awhile before treating BP in upper 180s since the episode of extremely low BP earlier that required Epi. Will continue to monitor closely. Bartholomew Crews, RN 05/10/2020 12:36 PM

## 2020-05-11 ENCOUNTER — Inpatient Hospital Stay (HOSPITAL_COMMUNITY): Payer: Medicare Other

## 2020-05-11 LAB — POCT I-STAT 7, (LYTES, BLD GAS, ICA,H+H)
Acid-Base Excess: 10 mmol/L — ABNORMAL HIGH (ref 0.0–2.0)
Acid-Base Excess: 11 mmol/L — ABNORMAL HIGH (ref 0.0–2.0)
Acid-Base Excess: 6 mmol/L — ABNORMAL HIGH (ref 0.0–2.0)
Bicarbonate: 31.5 mmol/L — ABNORMAL HIGH (ref 20.0–28.0)
Bicarbonate: 32.7 mmol/L — ABNORMAL HIGH (ref 20.0–28.0)
Bicarbonate: 32 mmol/L — ABNORMAL HIGH (ref 20.0–28.0)
Calcium, Ion: 1.11 mmol/L — ABNORMAL LOW (ref 1.15–1.40)
Calcium, Ion: 1.1 mmol/L — ABNORMAL LOW (ref 1.15–1.40)
Calcium, Ion: 1.17 mmol/L (ref 1.15–1.40)
HCT: 32 % — ABNORMAL LOW (ref 39.0–52.0)
HCT: 29 % — ABNORMAL LOW (ref 39.0–52.0)
HCT: 27 % — ABNORMAL LOW (ref 39.0–52.0)
Hemoglobin: 10.9 g/dL — ABNORMAL LOW (ref 13.0–17.0)
Hemoglobin: 9.9 g/dL — ABNORMAL LOW (ref 13.0–17.0)
Hemoglobin: 9.2 g/dL — ABNORMAL LOW (ref 13.0–17.0)
O2 Saturation: 95 %
O2 Saturation: 100 %
O2 Saturation: 96 %
Patient temperature: 98.9
Patient temperature: 98.9
Potassium: 4.2 mmol/L (ref 3.5–5.1)
Potassium: 4.1 mmol/L (ref 3.5–5.1)
Potassium: 4.2 mmol/L (ref 3.5–5.1)
Sodium: 141 mmol/L (ref 135–145)
Sodium: 141 mmol/L (ref 135–145)
Sodium: 142 mmol/L (ref 135–145)
TCO2: 32 mmol/L (ref 22–32)
TCO2: 34 mmol/L — ABNORMAL HIGH (ref 22–32)
TCO2: 34 mmol/L — ABNORMAL HIGH (ref 22–32)
pCO2 arterial: 32.1 mmHg (ref 32.0–48.0)
pCO2 arterial: 32 mmHg (ref 32.0–48.0)
pCO2 arterial: 50.9 mmHg — ABNORMAL HIGH (ref 32.0–48.0)
pH, Arterial: 7.599 — ABNORMAL HIGH (ref 7.350–7.450)
pH, Arterial: 7.618 (ref 7.350–7.450)
pH, Arterial: 7.407 (ref 7.350–7.450)
pO2, Arterial: 61 mmHg — ABNORMAL LOW (ref 83.0–108.0)
pO2, Arterial: 207 mmHg — ABNORMAL HIGH (ref 83.0–108.0)
pO2, Arterial: 87 mmHg (ref 83.0–108.0)

## 2020-05-11 LAB — COMPREHENSIVE METABOLIC PANEL
ALT: 130 U/L — ABNORMAL HIGH (ref 0–44)
AST: 110 U/L — ABNORMAL HIGH (ref 15–41)
Albumin: 2.2 g/dL — ABNORMAL LOW (ref 3.5–5.0)
Alkaline Phosphatase: 426 U/L — ABNORMAL HIGH (ref 38–126)
Anion gap: 24 — ABNORMAL HIGH (ref 5–15)
BUN: 112 mg/dL — ABNORMAL HIGH (ref 8–23)
CO2: 26 mmol/L (ref 22–32)
Calcium: 9.7 mg/dL (ref 8.9–10.3)
Chloride: 94 mmol/L — ABNORMAL LOW (ref 98–111)
Creatinine, Ser: 5.82 mg/dL — ABNORMAL HIGH (ref 0.61–1.24)
GFR calc Af Amer: 11 mL/min — ABNORMAL LOW (ref >60)
GFR calc non Af Amer: 9 mL/min — ABNORMAL LOW (ref >60)
Glucose, Bld: 73 mg/dL (ref 70–99)
Potassium: 4.4 mmol/L (ref 3.5–5.1)
Sodium: 144 mmol/L (ref 135–145)
Total Bilirubin: 0.6 mg/dL (ref 0.3–1.2)
Total Protein: 7.3 g/dL (ref 6.5–8.1)

## 2020-05-11 LAB — FIBRINOGEN: Fibrinogen: >800 mg/dL — ABNORMAL HIGH (ref 210–475)

## 2020-05-11 LAB — GLUCOSE, CAPILLARY
Glucose-Capillary: 53 mg/dL — ABNORMAL LOW (ref 70–99)
Glucose-Capillary: 55 mg/dL — ABNORMAL LOW (ref 70–99)
Glucose-Capillary: 113 mg/dL — ABNORMAL HIGH (ref 70–99)
Glucose-Capillary: 33 mg/dL — CL (ref 70–99)
Glucose-Capillary: 95 mg/dL (ref 70–99)
Glucose-Capillary: 53 mg/dL — ABNORMAL LOW (ref 70–99)
Glucose-Capillary: 57 mg/dL — ABNORMAL LOW (ref 70–99)

## 2020-05-11 LAB — CBC
HCT: 30.3 % — ABNORMAL LOW (ref 39.0–52.0)
Hemoglobin: 9.6 g/dL — ABNORMAL LOW (ref 13.0–17.0)
MCH: 27 pg (ref 26.0–34.0)
MCHC: 31.7 g/dL (ref 30.0–36.0)
MCV: 85.1 fL (ref 80.0–100.0)
Platelets: 397 10*3/uL (ref 150–400)
RBC: 3.56 MIL/uL — ABNORMAL LOW (ref 4.22–5.81)
RDW: 19.5 % — ABNORMAL HIGH (ref 11.5–15.5)
WBC: 15.7 10*3/uL — ABNORMAL HIGH (ref 4.0–10.5)
nRBC: 0 % (ref 0.0–0.2)

## 2020-05-11 LAB — PROTIME-INR
INR: 1.2 (ref 0.8–1.2)
Prothrombin Time: 15.1 seconds (ref 11.4–15.2)

## 2020-05-11 LAB — HEMOGLOBIN AND HEMATOCRIT, BLOOD
HCT: 29.6 % — ABNORMAL LOW (ref 39.0–52.0)
Hemoglobin: 9.4 g/dL — ABNORMAL LOW (ref 13.0–17.0)

## 2020-05-11 MED ORDER — DEXTROSE 50 % IV SOLN
1.0000 | Freq: Once | INTRAVENOUS | Status: AC
  Administered 2020-05-11: 50 mL via INTRAVENOUS

## 2020-05-11 MED ORDER — NOREPINEPHRINE 4 MG/250ML-% IV SOLN
0.0000 ug/min | INTRAVENOUS | Status: DC
  Administered 2020-05-11: 40 ug/min via INTRAVENOUS
  Administered 2020-05-11: 2 ug/min via INTRAVENOUS
  Filled 2020-05-11: qty 250

## 2020-05-11 MED ORDER — SODIUM CHLORIDE 0.9 % IV BOLUS
1000.0000 mL | Freq: Once | INTRAVENOUS | Status: AC
  Administered 2020-05-11: 1000 mL via INTRAVENOUS

## 2020-05-11 MED ORDER — DEXTROSE 50 % IV SOLN
INTRAVENOUS | Status: AC
  Filled 2020-05-11: qty 50

## 2020-05-11 MED ORDER — NOREPINEPHRINE 16 MG/250ML-% IV SOLN
0.0000 ug/min | INTRAVENOUS | Status: DC
  Administered 2020-05-11: 20 ug/min via INTRAVENOUS
  Administered 2020-05-12: 19 ug/min via INTRAVENOUS
  Filled 2020-05-11: qty 250

## 2020-05-11 MED ORDER — NOREPINEPHRINE 16 MG/250ML-% IV SOLN
0.0000 ug/min | INTRAVENOUS | Status: DC
  Administered 2020-05-11: 26 ug/min via INTRAVENOUS
  Administered 2020-05-11: 15 ug/min via INTRAVENOUS
  Filled 2020-05-11 (×2): qty 250

## 2020-05-11 MED ORDER — SODIUM CHLORIDE 0.9 % IV SOLN
50.0000 mg | Freq: Two times a day (BID) | INTRAVENOUS | Status: DC
  Administered 2020-05-11 – 2020-05-12 (×2): 50 mg via INTRAVENOUS
  Filled 2020-05-11 (×3): qty 5

## 2020-05-11 MED ORDER — NOREPINEPHRINE 4 MG/250ML-% IV SOLN
INTRAVENOUS | Status: AC
  Filled 2020-05-11: qty 250

## 2020-05-11 MED ORDER — DEXTROSE 50 % IV SOLN
12.5000 g | Freq: Once | INTRAVENOUS | Status: AC
  Administered 2020-05-11: 12.5 g via INTRAVENOUS

## 2020-05-11 MED ORDER — HEPARIN SODIUM (PORCINE) 1000 UNIT/ML IJ SOLN
INTRAMUSCULAR | Status: AC
  Filled 2020-05-11: qty 3

## 2020-05-11 NOTE — Progress Notes (Signed)
Hypoglycemic Event  CBG: 55  Treatment: D50 25 mL (12.5 gm)  Symptoms: None  Follow-up CBG: Time:344 CBG Result:114  Possible Reasons for Event: Change in activity     [place]

## 2020-05-11 NOTE — Progress Notes (Signed)
[facility] Progress Note  Subjective: BP's dropped on HD again this am despite no UF.  HD was aborted.   Vitals:   05/11/20 0815 05/11/20 0830 05/11/20 0845 05/11/20 0900  BP:    (!) 88/54  Pulse: 67 69 70 73  Resp: _0 Temp:      TempSrc:      SpO2: (!) 89% 90% 91% 93%  Weight:      Height:        Exam: Vent, no sedation, does not wake to voice  no jvd  Chest cta bilat  Cor reg no RG  Abd soft ntnd no ascites   Ext +1 UE> no LE edema   LUA aVF+bruit   OP HD: TTS NW  4h  400/800  2/2 bath 72.5kg  Hep 2300   L AVF   - hect 7 ug   CXR 6.13 - focus of apparent pneumonia right base. Atelectasis left base. Lungs elsewhere clear. Stable cardiac silhouette.  Assessment/ Plan: 1. Seizures/AMS - non-convulsive seizures per EEG, now off sedation, on keppra and vimpat IV and po valproic acid per tube.  Per neuro/ pmd. MRI negative, no evidence of PRES. Not uremia.  Neuro following - poor prognosis at this point as he's remained comatose.  May be transitioning to comfort care.   2. Fever/ pulm infiltrates - possible PNA improved. S/p IV ceftriaxone  3. Acute resp failure/ vol overload/ pulm edema - 6/5 CXR improved without edema after extra HD last week. Now is up 2-3kg.  Not tolerating any attempt at UF w/ HD.  4. ESRD - on HD TTS outpt, had extra HD last week for vol overload. Had yest was stopped due to severe BP drops. Attempted HD again today however BP's dropped sharply again and HD had to be stopped again.  5. Secondary hyperPTH: cont outpt hectorol dose 6. Hyperkalemia:  resolved    Rob Arias Weinert 05/11/2020, 10:33 AM   Recent Labs  Lab 05/10/20 1004 05/10/20 1047 05/10/20 1844 05/11/20 0158 05/11/20 0227 05/11/20 0227 05/11/20 0338 05/11/20 0734  K 4.0   < > 5.3*   < > 4.4   < > 4.1 4.2  BUN 74*   < > 97*  --  112*  --   --   --   CREATININE 4.27*   < > 5.14*  --  5.82*  --   --   --   CALCIUM 9.0   < > 9.3  --  9.7  --   --   --   PHOS  5.8*  --  9.1*  --   --   --   --   --   HGB  --    < >  --    < > 9.6*   < > 9.9* 9.2*   < > = values in this interval not displayed.   Inpatient medications: . amLODipine  10 mg Per Tube Daily  . carvedilol  25 mg Per Tube BID WC  . chlorhexidine gluconate (MEDLINE KIT)  15 mL Mouth Rinse BID  . Chlorhexidine Gluconate Cloth  6 each Topical Q0600  . docusate  100 mg Per Tube BID  . feeding supplement (PRO-STAT SUGAR FREE 64)  30 mL Per Tube TID  . heparin  5,000 Units Subcutaneous Q8H  . heparin sodium (porcine)      . insulin aspart  0-15 Units Subcutaneous Q4H  . mouth rinse  15 mL Mouth Rinse 10  times per day  . pantoprazole sodium  40 mg Per Tube QHS  . sodium chloride flush  10-40 mL Intracatheter Q12H  . Thrombi-Pad  1 each Topical Once  . valproic acid  400 mg Per Tube TID   . sodium chloride 500 mL/hr at 05/10/20 1011  . sodium chloride    . feeding supplement (NEPRO CARB STEADY) 1,000 mL (05/10/20 1314)  . lacosamide (VIMPAT) IV 100 mg (05/11/20 1007)  . levETIRAcetam Stopped (05/10/20 2157)  . levETIRAcetam Stopped (05/10/20 1757)  . norepinephrine (LEVOPHED) Adult infusion 26 mcg/min (05/11/20 1010)   acetaminophen (TYLENOL) oral liquid 160 mg/5 mL, dextrose, docusate sodium, fentaNYL (SUBLIMAZE) injection, fentaNYL (SUBLIMAZE) injection, hydrALAZINE, labetalol, polyethylene glycol, sodium chloride flush

## 2020-05-11 NOTE — Progress Notes (Signed)
Spoke with MD about blood gas, we changed vent mode to [facility] for better vent compliance, post gas was drawn, results did not improve. Elink was made aware, will continue PC ventilation for now.

## 2020-05-11 NOTE — Progress Notes (Signed)
[facility] Progress Note Patient Name: Jared [name]. DOB: 07/31/1956 MRN: [id number]   Date of Service  05/11/2020  HPI/Events of Note  Hypotension - Increasing Norepinephrine requirements. Hgb = 9.9 and LVEF = 65-70%  eICU Interventions  Plan: 1. Bolus with 0.9 NaCl 1 liter IV over 1 hour now.      Intervention Category Major Interventions: Hypotension - evaluation and management  Sha Amer Eugene 05/11/2020, 5:08 AM

## 2020-05-11 NOTE — Progress Notes (Signed)
Long d/w family. He is not tolerating therapy. (see note earlier). Based on our discussion yesterday we will dc HD, not titrate gtts up any further and work towards transition to comfort w/ compassionate extubation once all family arrive.   Erick Colace ACNP-BC [place] Pager # [phone] OR # [phone] if no answer

## 2020-05-11 NOTE — Progress Notes (Signed)
Called to assess sudden hypotension, hypoxia.   Also oozing from art line.   Everything was resolved at my arrival, actually was hypertensive, levophed had been turned off.    Patient here with encephalopathy, s/p non convulsive status, persistent encephalopathy, hypotension with HD (CKD).    No visible agitation or convulsions, but he is frequently dyssynchronous with the vent, breathing at 20, set rate only 14.    Check chemistry, cbc, inr, fibrinogen, abg.   CXR  Had to restart levophed at low dose as BP started strending downward.   Trial of PC ventilation, he may tolerate that better with more  Control of flow.

## 2020-05-11 NOTE — Progress Notes (Signed)
[facility] Progress Note Patient Name: Jared [name]. DOB: 28-Jun-1956 MRN: [id number]   Date of Service  05/11/2020  HPI/Events of Note  Acute onset of Hypotension, Bradycardia and hypoxia. Sat = 85%, HR = 55 and BP =   eICU Interventions  Plan: 1. H/H STAT. 2. Norepinephrine IV infusion. 3. Will ask ground team to evaluate patient at bedside.      Intervention Category Major Interventions: Arrhythmia - evaluation and management;Hypoxemia - evaluation and management;Hypotension - evaluation and management  Lysle Dingwall 05/11/2020, 1:58 AM

## 2020-05-11 NOTE — Progress Notes (Signed)
[facility] Progress Note Patient Name: Jared [name]. DOB: 1956-06-15 MRN: [id number]   Date of Service  05/11/2020  HPI/Events of Note  ABG on 80%/PC 15/Rate 14/P 5 = 7.618/32.0/207. RT decreased the FiO2 to 60%.  eICU Interventions  Plan: 1. Decrease rate to 10. 2. Repeat ABG at 7:30 AM.     Intervention Category Major Interventions: Acid-Base disturbance - evaluation and management;Respiratory failure - evaluation and management  Genavie Boettger Cornelia Copa 05/11/2020, 4:18 AM

## 2020-05-11 NOTE — Progress Notes (Signed)
NAME:  Jared [name], Jared [name] MRN:  [id number], DOB:  June 10, 1956, LOS: 22 ADMISSION DATE:  05/11/2020, CONSULTATION DATE: 6/1 REFERRING MD: Dr. Tyrone Nine EDP, CHIEF COMPLAINT: Unresponsiveness  Brief History   64 year old male with ESRD on HD unresponsive from home.  Intubated in ED.  Has missed 2 dialysis sessions.  Hyperkalemic and volume overloaded in the ED.  Admitted to ICU for emergent hemodialysis.  Past Medical History   has a past medical history of Anemia, Cancer ([place]), Chronic kidney disease, Diabetes mellitus without complication ([place]), GERD (gastroesophageal reflux disease), Hypertension, Seizures ([place]), Stroke ([place]), and Thyroid disease.  Significant Hospital Events   6/1 admit for emergent dialysis. Intubated 6/2 Seizures 6/4 remains in status epilepticus, shock due to heavy sedation.  6/5 Bps improved, starting to wean sedation 6/6 sedation stopped 6/14 bradycardia and refractory shock during HD. Required rapid titration of norepi; addition of Epi and stopped HD -->made DNR. BP did recover. Seemingly after we stopped HD and gave volume back.  Actually became hypertensive. 6/15 hypotensive again over night, started oozing from line insertion sites, pressors re-started. Hypoglycemic got D50, resp alk. Vent changes made. Getting more fluid over night. On am rounds on HD.   Consults:  Nephrology Neurology  Procedures:  ETT 6/1> R Fem CVC 6/3>6/7 Left fem CVL 6/14>>> Left fem aline 6/14>>>  Significant Diagnostic Tests:  CT head 6/1 > chronic microvascular ischemic changes, volume loss. No acute intracranial abnormalities CXR 6/2> improved pulmonary edema  MRI Brain 6/2> no acute intracranial abnormality, no findings suggestive of PRES.  CXR 6/3> lower lung volumes. 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 Bowel loops visible.  EEG 6/3 > subclinical seizures, multiple Overnight EEG  6/4 > seizures without clinical signs, average 6-8/ hour arising from left and  right hemisphere, lasting 20 seconds on an average. Echo 6/14: Left Ventricle: Left ventricular ejection fraction, by estimation, is 65 to 70%. The left ventricle has normal function. The left ventricle has no regional wall motion abnormalities. The left ventricular internal cavity  size was normal in size. There is moderate left ventricular hypertrophy. Left ventricular diastolic parameters were normal.   Micro Data:  All neg  Antimicrobials:  6/4>>6/7 rocephin  Interim history/subjective:   Escalating pressor needs again  I requested HD stop  Objective   Blood pressure (Abnormal) (Pended) 116/50, pulse (Pended) 66, temperature (Pended) 97.7 F (36.5 C), temperature source (Pended) Axillary, resp. rate (Pended) 11, height '5\' 8"'$  (1.727 m), weight 78.9 kg, SpO2 94 %.    Vent Mode: PCV FiO2 (%):  [30 %-100 %] 50 % Set Rate:  [10 bmp-18 bmp] 10 bmp Vt Set:  [550 mL] 550 mL PEEP:  [5 cmH20] 5 cmH20 Plateau Pressure:  [14 cmH20-19 cmH20] 17 cmH20   Intake/Output Summary (Last 24 hours) at 05/11/2020 0814 Last data filed at 05/11/2020 0600 Gross per 24 hour  Intake 2607.14 ml  Output 1270 ml  Net 1337.14 ml   Filed Weights   05/10/20 0500 05/10/20 0530 05/11/20 0459  Weight: 76.7 kg 76.7 kg 78.9 kg    Examination:     General critically ill black male. Remains unresponsive on vent HENT scleral edema pupils equal and reactive. Orally intubated pulm equal bilateral chest rise currently on PC 15/peep 5 Vts 500s  Card NSR currently w/ episodes of bradycardia again. Norepi needs escalating once again  abd not tender liq stools hypoactive bowel sounds Ext cool cap refill still intact pulses weak. Oozing blood from line insertion  sites Neuro GCS 3 GU anuric   Resolved Hospital Problem list    Aspiration pneumonitis vs. Pneumonia- with fevers, leukocytosis, MRSA swab neg, tracheal aspirate neg - Short course of ceftriaxone reasonable, end date placed for 6/7 Hypotension related to  sedation- resolved  Assessment & Plan:   Comatose state s/p prolonged Status epilepticus; s/p burst suppression therapy-MRI without evidence of PRES.  Has hx of seizures in past. ->anticipate it will take several days to weeks if he is to improve neurologically.  Plan Cont AEDs (keppra and VPA) Cont supportive care RASS goal 0  Acute hypoxic respiratory failure- due to inability to handle secretions, aspiration, volume overload pcxr 6/15 right basilar vol loss ABG reviewed.  Still not candidate for weaning d/t mental status and hemodynamic instability  Plan Full vent support VAP bundle  PAD protocol   Circulatory shock. W/ symptomatic bradycardia during HD.  -marked autonomic instability w/ wide variation in BP/HR w/out clear instigating factors . He does NOT seem to be tolerating HD-->as evidenced by bradycardia and hypotension w/ rising pressor needs once again  -echo w/ intact EF.  Plan Keep euvolemic  Stop HD (he is not tolerating this) Cont Norepi Will meet w/ family   ESRD on HD w/ on-going hyperkalemia  Plan Not a candidate for CRRT Will need to visit w/ family. Seemingly not tolerating HD in general   DM, poorly controlled -A1c 9.6-->wide variation on glycemic control -->hypoglycemic over night  Plan Cont ssi Stop levemir and scheduled aspart  Hx prostate cancer-followed by [facility], do not have nubeqa on formulary Plan Likely moot point re: follow up   Anemia of chronic disease, now oozing from line insertion site -->suspect this is due to general platelet dysfxn from uremia  Plan Trend cbc May need to consider DDAVP  Best practice:  Diet: Tube feeds Pain/Anxiety/Delirium protocol (if indicated): none VAP protocol (if indicated): yes DVT prophylaxis: SQH GI prophylaxis: PPI Glucose control: SSI  + levemir  Mobility: BR Code Status: DNR ->I think he is showing [place] he is simply intolerant of HD. He has wide autonomic variation w/ both refractory shock  and then hypertension. His GCS is 3. He certainly could have a new occult neurological insult. Either way I do not think that he can survive this and will meet w/ family.    Erick Colace ACNP-BC [place] Pager # [phone] OR # [phone] if no answer

## 2020-05-12 LAB — PROLACTIN: Prolactin: 71.2 ng/mL — ABNORMAL HIGH (ref 4.0–15.2)

## 2020-05-27 NOTE — Progress Notes (Signed)
Nutrition Brief Note  Chart reviewed. Plans for transition to comfort care with compassionate extubation when family arrives.  No further nutrition interventions warranted at this time. Please re-consult as needed.     Lucas Mallow, RD, LDN, CNSC Please refer to [facility] for contact information.

## 2020-05-27 NOTE — Death Summary Note (Signed)
DEATH SUMMARY   Patient Details  Name: Jared [name]. MRN: [id number] DOB: Mar 09, 1956  Admission/Discharge Information   Admit Date:  05/20/20  Date of Death: Date of Death: 06-04-20  Time of Death: Time of Death: 03-11-05  Length of Stay: 03/04/23  Referring Physician: Patient, No Pcp Per   Reason(s) for Hospitalization  Volume overload  Diagnoses  Preliminary cause of death:  Secondary Diagnoses (including complications and co-morbidities):  Active Problems:   ESRD on dialysis ([place])   Encephalopathy acute   Respiratory failure (HCC)   Acute on chronic renal failure (HCC)   Endotracheally intubated   Acute pulmonary edema (HCC)   Hypervolemia   Hypotension due to drugs   Shock circulatory ([place])   Encounter for central line placement   Brief Hospital Course (including significant findings, care, treatment, and services provided and events leading to death)  Jared [name]. is a 64 y.o. year old male who was found unresponsive at home.  Had missed 2 dialysis sessions.  Was started on emergent hemodialysis for hyperkalemia.  He then seized and developed status epilepticus and required heavy sedation and intubation. Sedation was stopped.  Continue to have dialysis intolerance as he had previously had which has been the reason why he has been missing his dialysis.  EEG continued to show diffuse encephalopathy and made no meaningful neurological recovery.  The overall picture was discussed with the family and given his poor neurological status and inability to tolerate hemodialysis it was decided by the family to transition him to comfort care.  He was compassionately extubated.  Pertinent Labs and Studies  Significant Diagnostic Studies DG Abd 1 View  Result Date: 05/05/2020 CLINICAL DATA:  Ileus EXAM: ABDOMEN - 1 VIEW COMPARISON:  None. FINDINGS: Enteric tube tip and side port project over the stomach. Normal bowel gas pattern. IMPRESSION: Enteric tube tip and side port in the  stomach. Electronically Signed   By: Ulyses Jarred M.D.   On: 05/05/2020 21:21   CT Head Wo Contrast  Result Date: 04/28/2020 CLINICAL DATA:  Altered mental status EXAM: CT HEAD WITHOUT CONTRAST TECHNIQUE: Contiguous axial images were obtained from the base of the skull through the vertex without intravenous contrast. COMPARISON:  04/29/2018 FINDINGS: Brain: No evidence of acute infarction, hemorrhage, hydrocephalus, extra-axial collection or mass lesion/mass effect. Scattered low-density changes within the periventricular and subcortical white matter compatible with chronic microvascular ischemic change. Mild-moderate diffuse cerebral volume loss. Vascular: Atherosclerotic calcifications involving the large vessels of the skull base. No unexpected hyperdense vessel. Skull: Normal. Negative for fracture or focal lesion. Sinuses/Orbits: No acute finding. Other: Partially visualized endotracheal and orogastric tubes. IMPRESSION: 1. No acute intracranial findings. 2. Chronic microvascular ischemic change and cerebral volume loss. Electronically Signed   By: Davina Poke D.O.   On: 04/28/2020 11:57   MR BRAIN WO CONTRAST  Result Date: 04/28/2020 CLINICAL DATA:  Initial evaluation for acute altered mental status, hypertensive crisis, seizure. EXAM: MRI HEAD WITHOUT CONTRAST TECHNIQUE: Multiplanar, multiecho pulse sequences of the brain and surrounding structures were obtained without intravenous contrast. COMPARISON:  Prior head CT from earlier the same day. FINDINGS: Brain: Mildly advanced age-related cerebral atrophy. No significant cerebral white matter disease for age. No abnormal foci of restricted diffusion to suggest acute or subacute ischemia. Gray-white matter differentiation maintained. No encephalomalacia to suggest chronic cortical infarction. No foci of susceptibility artifact to suggest acute or chronic intracranial hemorrhage. No mass lesion, midline shift or mass effect. Ventricles normal size  without hydrocephalus.  No extra-axial fluid collection. No intrinsic temporal lobe abnormality. No findings to suggest PRES. Pituitary gland suprasellar region within normal limits. Midline structures intact. Vascular: Major intracranial vascular flow voids are maintained. Skull and upper cervical spine: Craniocervical junction within normal limits. Bone marrow signal intensity normal. No scalp soft tissue abnormality. Sinuses/Orbits: Globes and orbital soft tissues within normal limits. Mild scattered mucosal thickening noted throughout the paranasal sinuses. Fluid seen layering within the nasopharynx. Small left greater than right mastoid effusions. Patient appears to be intubated. Other: None. IMPRESSION: 1. No acute intracranial abnormality.  No findings to suggest PRES. 2. Mildly advanced age-related cerebral atrophy. Electronically Signed   By: Jeannine Boga M.D.   On: 04/28/2020 23:57   DG CHEST PORT 1 VIEW  Result Date: 05/11/2020 CLINICAL DATA:  Hypoxia EXAM: PORTABLE CHEST 1 VIEW COMPARISON:  Two days ago FINDINGS: Endotracheal tube with tip halfway between the clavicular heads and carina. Enteric tube which reaches the stomach. Low lung volumes with hazy opacity at the right base that could be atelectasis or consolidation. No edema, effusion, or pneumothorax. Stable heart size IMPRESSION: Stable hardware positioning and right lower lobe opacity Electronically Signed   By: Monte Fantasia M.D.   On: 05/11/2020 06:34   DG CHEST PORT 1 VIEW  Result Date: 05/09/2020 CLINICAL DATA:  Hypoxia EXAM: PORTABLE CHEST 1 VIEW COMPARISON:  May 01, 2020 FINDINGS: Endotracheal tube tip is 4.3 cm above the carina. Nasogastric tube tip and side port are in the stomach. No pneumothorax. There is a small focus of airspace opacity in the right base. There is atelectatic change in the left base. Lungs otherwise are clear. Heart size and pulmonary vascularity are normal. No adenopathy. No bone lesions.  IMPRESSION: Tube positions as described without pneumothorax. Focus of apparent pneumonia right base. Atelectasis left base. Lungs elsewhere clear. Stable cardiac silhouette. Electronically Signed   By: Lowella Grip III M.D.   On: 05/09/2020 11:36   DG CHEST PORT 1 VIEW  Result Date: 05/01/2020 CLINICAL DATA:  Intubation EXAM: PORTABLE CHEST 1 VIEW COMPARISON:  Yesterday FINDINGS: Endotracheal tube tip is just below the clavicular heads. The enteric tube reaches the stomach at least. Streaky infiltrates about both hila and lower lobes. No edema, effusion, or pneumothorax. Normal heart size and mediastinal contours for technique. Artifact from EKG leads IMPRESSION: Stable hardware positioning and bilateral infiltrates. Electronically Signed   By: Monte Fantasia M.D.   On: 05/01/2020 08:13   DG CHEST PORT 1 VIEW  Result Date: 04/30/2020 CLINICAL DATA:  Intubation EXAM: PORTABLE CHEST 1 VIEW COMPARISON:  Yesterday FINDINGS: Endotracheal tube tip is just below the clavicular heads. The enteric tube reaches the stomach. Bilateral airspace disease. Stable heart size and mediastinal contours with distortion by rotation. IMPRESSION: 1. Unremarkable hardware positioning. 2. Bilateral airspace disease without convincing change Electronically Signed   By: Monte Fantasia M.D.   On: 04/30/2020 07:14   DG CHEST PORT 1 VIEW  Result Date: 04/29/2020 CLINICAL DATA:  Acute respiratory failure EXAM: PORTABLE CHEST 1 VIEW COMPARISON:  There yesterday FINDINGS: Lower volumes but diminished pulmonary opacity, now most prominent behind the heart. Prominent heart size in the setting of lower lung volumes. The endotracheal tube tip is just below the clavicular heads and the orogastric tube reaches the stomach. No visible effusion or pneumothorax. IMPRESSION: 1. Lower lung volumes but improved pulmonary opacity. 2. Unremarkable hardware positioning. Electronically Signed   By: Monte Fantasia M.D.   On: 04/29/2020 08:10    DG  Chest Port 1 View  Result Date: 04/28/2020 CLINICAL DATA:  Hypoxia EXAM: PORTABLE CHEST 1 VIEW COMPARISON:  Yesterday FINDINGS: Endotracheal tube tip is just below the clavicular heads. The enteric tube reaches the stomach. Bilateral pneumonia. Lung inflation is mildly improved. Normal heart size and mediastinal contours. No visible effusion or pneumothorax IMPRESSION: Stable hardware positioning and bilateral pneumonia. Lung volumes are improved from yesterday. Electronically Signed   By: Monte Fantasia M.D.   On: 04/28/2020 08:51   DG Chest Portable 1 View  Result Date: 05/03/2020 CLINICAL DATA:  Check endotracheal tube placement, unresponsive with missed dialysis session EXAM: PORTABLE CHEST 1 VIEW COMPARISON:  12/13/2018 FINDINGS: Cardiac shadow is within normal limits. Endotracheal tube and gastric catheter are seen in satisfactory position. Diffuse increased perihilar density is noted likely related to vascular congestion and pulmonary edema. Mild interstitial facial edema is noted as well. No focal confluent infiltrate is seen. No bony abnormality is noted. IMPRESSION: Changes consistent with vascular congestion and pulmonary edema. Tubes and lines as described. Electronically Signed   By: Inez Catalina M.D.   On: 05/18/2020 21:12   EEG adult  Result Date: 05/08/2020 Greta Doom, MD     05/08/2020  7:48 PM Patient Name: Jaquawn Saffran. MRN: [id number] EEG Attending: [name] Referring Physician/Provider: [name] Date: 05/08/2020 Duration: 22 minutes Patient history: 64 year old male being evaluated for continued encephalopathy Level of alertness: Comatose AEDs during EEG study: Technical aspects: This EEG study was done with scalp electrodes positioned according to the 10-20 International system of electrode placement. Electrical activity was acquired at a sampling rate of 500Hz  and reviewed with a high frequency filter of 70Hz  and a low frequency filter of  1Hz . EEG data were recorded continuously and digitally stored. BACKGROUND ACTIVITY: The background consist predominately of generalized irregular delta range activity with a 10 Hz alpha range activity that is diffusely distributed, but with frontocentral predominance.  There is also a brief run of rhythmic delta without clear evolution. EPILEPTIFORM ACTIVITY: Interictal epileptiform activity: None Ictal Activity: None OTHER EVENTS: None ACTIVATION PROCEDURES: Hyperventilation and photic stimulation were not performed. IMPRESSION: This study is consistent with a generalized nonspecific cerebral dysfunction (encephalopathy). There was no seizure or evidence of seizure predisposition recorded on this study. [name], MD [facility] [phone] If 7pm- 7am, please page neurology on call as listed in [place]. If 7pm- 7am, please page neurology on call as listed in [place].   EEG adult  Result Date: 04/29/2020 Lora Havens, MD     04/29/2020  9:20 AM Patient Name: Penne Lash. MRN: [id number] Epilepsy Attending: Lora Havens Referring Physician/Provider: Eliseo Gum, NP Date: 04/28/2020 Duration: 24.21 minutes Patient history: 64 year old male with history of seizures on Keppra was noted to have further seizure-like activity.  EEG evaluate for seizures. Level of alertness: comatose AEDs during EEG study: Keppra, Ativan, propofol Technical aspects: This EEG study was done with scalp electrodes positioned according to the 10-20 International system of electrode placement. Electrical activity was acquired at a sampling rate of 500Hz  and reviewed with a high frequency filter of 70Hz  and a low frequency filter of 1Hz . EEG data were recorded continuously and digitally stored. Description: Seven seizures without clinical signs arising from left hemisphere, lasting about 20 seconds on an average were noted during the study, last seizure at 1913.  During the seizure EEG showed sharply contoured  polymorphic 6 to 8 Hz theta-alpha activity which gradually evolved into 2 to 3 Hz delta  activity and involve the right hemisphere.  EEG also showed polyspikes which at times appeared generalized and at other times appeared bilaterally independent lasting 2 to 3 seconds alternating with generalized background suppression lasting 2-4 seconds. Hyperventilation and photic stimulation were not performed.   ABNORMALITY -Seizure without clinical signs, left hemisphere -Polyspikes, generalized and bilateral independent -Background suppression, generalized IMPRESSION: This study showed seven seizures without clinical signs arising from left hemisphere, lasting 20 seconds on an average, last seizure at [place] on 04/28/2020.  EEG also showed generalized and bilateral independent left and right hemisphere epileptogenicity.  Additionally there is evidence of severe diffuse encephalopathy, nonspecific etiology. Consider long-term EEG if concern for ictal-interictal activity persists. Priyanka Barbra Sarks   Overnight EEG with video  Result Date: 04/30/2020 Lora Havens, MD     04/30/2020  6:29 PM Patient Name: Penne Lash. MRN: [id number] Epilepsy Attending: Lora Havens Referring Physician/Provider: Eliseo Gum, NP Duration: 04/29/2020 1113 to 04/30/2020 1113  Patient history: 64 year old male with history of seizures on Keppra was noted to have further seizure-like activity.  EEG evaluate for seizures.  Level of alertness: comatose  AEDs during EEG study: Keppra, VPA, versed, propofol  Technical aspects: This EEG study was done with scalp electrodes positioned according to the 10-20 International system of electrode placement. Electrical activity was acquired at a sampling rate of 500Hz  and reviewed with a high frequency filter of 70Hz  and a low frequency filter of 1Hz . EEG data were recorded continuously and digitally stored.  Description:  At the onset of the study, EEG showed seizures without clinical signs, avg  6-8/hour, arising from left and right hemisphere, lasting about 20 seconds on an average.  After around 1400 on 04/29/2020, EEG showed discontinuous pattern with 3-6 seconds of bursts of generalized as well as bilateral independent polyspikes alternating with 3-5 seconds of background suppression.  Hyperventilation and photic stimulation were not performed.    ABNORMALITY -Seizure without clinical signs, left and right hemisphere -Polyspikes, generalized and bilateral independent -Background suppression, generalized  IMPRESSION: This study  initially showed seizures without clinical signs, average 6-8/ hour arising from left and right hemisphere, lasting 20 seconds on an average.  After around 1400 on 12/31/2019, EEG showed evidence of generalized and bilateral independent left and right hemisphere epileptogenicity.  Additionally there is evidence of severe diffuse encephalopathy, nonspecific etiology.  Lora Havens   ECHOCARDIOGRAM COMPLETE  Result Date: 05/10/2020    ECHOCARDIOGRAM REPORT   Patient Name:   Ronnel Zuercher. Date of Exam: 05/10/2020 Medical Rec #:  [id number]            Height:       68.0 in Accession #:    [id number]           Weight:       169.1 lb Date of Birth:  May 24, 1956            BSA:          1.903 m Patient Age:    44 years             BP:           184/87 mmHg Patient Gender: M                    HR:           79 bpm. Exam Location:  Inpatient Procedure: 2D Echo Indications:    acute respiratory insufficiency 518.82  History:  Patient has prior history of Echocardiogram examinations, most                 recent 03/14/2017. End stage renal disease.  Sonographer:    Johny Chess Referring Phys: [id number] PETER E BABCOCK  Sonographer Comments: Echo performed with patient supine and on artificial respirator. Image acquisition challenging due to respiratory motion. IMPRESSIONS  1. Left ventricular ejection fraction, by estimation, is 65 to 70%. The left ventricle has normal  function. The left ventricle has no regional wall motion abnormalities. There is moderate left ventricular hypertrophy. Left ventricular diastolic parameters were normal.  2. Right ventricular systolic function is normal. The right ventricular size is normal. Tricuspid regurgitation signal is inadequate for assessing PA pressure.  3. The mitral valve is normal in structure. No evidence of mitral valve regurgitation.  4. The aortic valve is tricuspid. Aortic valve regurgitation is not visualized. Mild to moderate aortic valve sclerosis/calcification is present, without any evidence of aortic stenosis.  5. The inferior vena cava is normal in size with greater than 50% respiratory variability, suggesting right atrial pressure of 3 mmHg. FINDINGS  Left Ventricle: Left ventricular ejection fraction, by estimation, is 65 to 70%. The left ventricle has normal function. The left ventricle has no regional wall motion abnormalities. The left ventricular internal cavity size was normal in size. There is  moderate left ventricular hypertrophy. Left ventricular diastolic parameters were normal. Right Ventricle: The right ventricular size is normal. No increase in right ventricular wall thickness. Right ventricular systolic function is normal. Tricuspid regurgitation signal is inadequate for assessing PA pressure. Left Atrium: Left atrial size was normal in size. Right Atrium: Right atrial size was normal in size. Pericardium: Trivial pericardial effusion is present. Mitral Valve: The mitral valve is normal in structure. No evidence of mitral valve regurgitation. Tricuspid Valve: The tricuspid valve is normal in structure. Tricuspid valve regurgitation is not demonstrated. Aortic Valve: The aortic valve is tricuspid. Aortic valve regurgitation is not visualized. Mild to moderate aortic valve sclerosis/calcification is present, without any evidence of aortic stenosis. Pulmonic Valve: The pulmonic valve was not well visualized.  Pulmonic valve regurgitation is not visualized. Aorta: The aortic root and ascending aorta are structurally normal, with no evidence of dilitation. Venous: The inferior vena cava is normal in size with greater than 50% respiratory variability, suggesting right atrial pressure of 3 mmHg. IAS/Shunts: No atrial level shunt detected by color flow Doppler.  LEFT VENTRICLE PLAX 2D LVIDd:         4.80 cm  Diastology LVIDs:         3.00 cm  LV e' lateral:   14.10 cm/s LV PW:         1.20 cm  LV E/e' lateral: 5.3 LV IVS:        1.00 cm  LV e' medial:    8.70 cm/s LVOT diam:     2.10 cm  LV E/e' medial:  8.5 LVOT Area:     3.46 cm  RIGHT VENTRICLE             IVC RV S prime:     12.10 cm/s  IVC diam: 1.70 cm LEFT ATRIUM             Index       RIGHT ATRIUM           Index LA diam:        3.60 cm 1.89 cm/m  RA Area:     11.40 cm LA  Vol [facility]):   64.3 ml 33.79 ml/m RA Volume:   21.90 ml  11.51 ml/m LA Vol (A4C):   41.5 ml 21.81 ml/m LA Biplane Vol: 53.8 ml 28.27 ml/m   AORTA Ao Asc diam: 3.30 cm MITRAL VALVE MV Area (PHT): 2.73 cm     SHUNTS MV Decel Time: 278 msec     Systemic Diam: 2.10 cm MV E velocity: 74.10 cm/s MV A velocity: 110.00 cm/s MV E/A ratio:  0.67 Oswaldo Milian MD Electronically signed by Oswaldo Milian MD Signature Date/Time: 05/10/2020/10:31:18 PM    Final     Microbiology No results found for this or any previous visit (from the past 240 hour(s)).  Lab Basic Metabolic Panel: Recent Labs  Lab 05/09/20 0939 05/09/20 0939 05/09/20 1743 05/09/20 1743 05/10/20 0233 05/10/20 0907 05/10/20 1004 05/10/20 1004 05/10/20 1844 05/11/20 0225 05/11/20 0227 05/11/20 0338 05/11/20 0734  NA 138   < > 137   < > 140   < > 139   < > 139 141 144 141 142  K 6.8*   < > 7.1*   < > 6.4*   < > 4.0   < > 5.3* 4.2 4.4 4.1 4.2  CL 92*   < > 92*  --  91*  --  96*  --  95*  --  94*  --   --   CO2 24   < > 22  --  25  --  26  --  23  --  26  --   --   GLUCOSE 241*   < > 230*  --  228*  --  110*   --  291*  --  73  --   --   BUN 147*   < > 160*  --  174*  --  74*  --  97*  --  112*  --   --   CREATININE 7.28*   < > 7.58*  --  8.01*  --  4.27*  --  5.14*  --  5.82*  --   --   CALCIUM 9.7   < > 9.7  --  9.6  --  9.0  --  9.3  --  9.7  --   --   PHOS 9.3*  --  9.4*  --  9.5*  --  5.8*  --  9.1*  --   --   --   --    < > = values in this interval not displayed.   Liver Function Tests: Recent Labs  Lab 05/09/20 1743 05/10/20 0233 05/10/20 1004 05/10/20 1844 05/11/20 0227  AST  --   --   --   --  110*  ALT  --   --   --   --  130*  ALKPHOS  --   --   --   --  426*  BILITOT  --   --   --   --  0.6  PROT  --   --   --   --  7.3  ALBUMIN 2.3* 2.2* 2.2* 2.2* 2.2*   No results for input(s): LIPASE, AMYLASE in the last 168 hours. No results for input(s): AMMONIA in the last 168 hours. CBC: Recent Labs  Lab 05/10/20 0233 05/10/20 0907 05/10/20 1047 05/10/20 1047 05/11/20 0158 05/11/20 0225 05/11/20 0227 05/11/20 0338 05/11/20 0734  WBC 12.9*  --  12.2*  --   --   --  15.7*  --   --  HGB 8.9*   < > 8.9*   < > 9.4* 10.9* 9.6* 9.9* 9.2*  HCT 28.1*   < > 28.4*   < > 29.6* 32.0* 30.3* 29.0* 27.0*  MCV 86.5  --  87.1  --   --   --  85.1  --   --   PLT 331  --  374  --   --   --  397  --   --    < > = values in this interval not displayed.   Cardiac Enzymes: Recent Labs  Lab 05/09/20 0939  CKTOTAL 114   Sepsis Labs: Recent Labs  Lab 05/10/20 0233 05/10/20 1047 05/11/20 0227  WBC 12.9* 12.2* 15.7*    Procedures/Operations  Hemodialysis, continuous EEG, mechanical ventilation.   Hoa Briggs 05/14/2020, 5:32 PM

## 2020-05-27 NOTE — Progress Notes (Addendum)
Patient time of death is on 06/02/2020 at 1606.   Death was pronounced by Geroge Baseman and Charmayne Sheer.  Patient's sisters and girlfriend were at bedside when patient died. Chaplain services was also called to assist with spiritual care.  Family hasn't decided which funeral home they will be sending him to. RN gave the patient bed placement business card to sister - [name] with the department phone number once the family has decided where he will go.

## 2020-05-27 NOTE — Progress Notes (Signed)
Pt expired. Wasted Vimpat 50 mg IVPB 05/13/20 @ 1055  -pkp/JO

## 2020-05-27 NOTE — Progress Notes (Signed)
The chaplain visited with the family as the patient died. The chaplain offered prayer and words of encouragement. The chaplain is available if more support is needed.  Brion Aliment Chaplain Resident For questions concerning this note please contact me by pager [phone]

## 2020-05-27 NOTE — Progress Notes (Addendum)
NAME:  Jared [name], Jared [name] MRN:  [id number], DOB:  1956-05-01, LOS: 44 ADMISSION DATE:  05/22/2020, CONSULTATION DATE: 6/1 REFERRING MD: Dr. Tyrone Nine EDP, CHIEF COMPLAINT: Unresponsiveness  Brief History   64 year old male with ESRD on HD unresponsive from home.  Intubated in ED.  Has missed 2 dialysis sessions.  Hyperkalemic and volume overloaded in the ED.  Admitted to ICU for emergent hemodialysis.  Past Medical History   has a past medical history of Anemia, Cancer ([place]), Chronic kidney disease, Diabetes mellitus without complication ([place]), GERD (gastroesophageal reflux disease), Hypertension, Seizures ([place]), Stroke ([place]), and Thyroid disease.  Significant Hospital Events   6/1 admit for emergent dialysis. Intubated 6/2 Seizures 6/4 remains in status epilepticus, shock due to heavy sedation.  6/5 Bps improved, starting to wean sedation 6/6 sedation stopped 6/14 bradycardia and refractory shock during HD. Required rapid titration of norepi; addition of Epi and stopped HD -->made DNR. BP did recover. Seemingly after we stopped HD and gave volume back.  Actually became hypertensive. 6/15 hypotensive again over night, started oozing from line insertion sites, pressors re-started. Hypoglycemic got D50, resp alk. Vent changes made. Getting more fluid over night. On am rounds on HD.  6/16 after no longer tolerating dialysis on the 15th we discontinued this.  Decision made by family and healthcare team to transition to comfort based care with plan to extubate once all family arrives Consults:  Nephrology Neurology  Procedures:  ETT 6/1> R Fem CVC 6/3>6/7 Left fem CVL 6/14>>> Left fem aline 6/14>>>  Significant Diagnostic Tests:  CT head 6/1 > chronic microvascular ischemic changes, volume loss. No acute intracranial abnormalities CXR 6/2> improved pulmonary edema  MRI Brain 6/2> no acute intracranial abnormality, no findings suggestive of PRES.  CXR 6/3> lower lung volumes. 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 Bowel loops visible.  EEG 6/3 > subclinical seizures, multiple Overnight EEG  6/4 > seizures without clinical signs, average 6-8/ hour arising from left and right hemisphere, lasting 20 seconds on an average. Echo 6/14: Left Ventricle: Left ventricular ejection fraction, by estimation, is 65 to 70%. The left ventricle has normal function. The left ventricle has no regional wall motion abnormalities. The left ventricular internal cavity  size was normal in size. There is moderate left ventricular hypertrophy. Left ventricular diastolic parameters were normal.   Micro Data:  All neg  Antimicrobials:  6/4>>6/7 rocephin  Interim history/subjective:   Appears comfortable  Objective   Blood pressure (Abnormal) 108/58, pulse 83, temperature 98.6 F (37 C), temperature source Oral, resp. rate 14, height '5\' 8"'$  (1.727 m), weight 80 kg, SpO2 94 %.    Vent Mode: PCV FiO2 (%):  [40 %-50 %] 40 % Set Rate:  [10 bmp] 10 bmp PEEP:  [5 cmH20] 5 cmH20 Plateau Pressure:  [16 cmH20-18 cmH20] 17 cmH20   Intake/Output Summary (Last 24 hours) at 05-19-2020 0951 Last data filed at 05/19/2020 0600 Gross per 24 hour  Intake 1169.03 ml  Output no documentation  Net 1169.03 ml   Filed Weights   05/10/20 0530 05/11/20 0459 05-19-2020 0500  Weight: 76.7 kg 78.9 kg 80 kg    Examination:   General this is a 64 year old black male he is currently unresponsive on full ventilatory support HEENT normocephalic atraumatic no jugular venous distention orally intubated sclera nonicteric Pulmonary coarse bilateral breath sounds equal chest rise bilaterally remains on full ventilatory support Cardiac regular rate and rhythm at times bradycardic Abdomen soft nontender These are warm and  dry with palpable pulses Neuro GCS 3 GU anuric  Resolved Hospital Problem list    Aspiration pneumonitis vs. Pneumonia- with fevers, leukocytosis, MRSA swab neg, tracheal aspirate neg -  Short course of ceftriaxone reasonable, end date placed for 6/7 Hypotension related to sedation- resolved  Assessment & Plan:   Comatose state s/p prolonged Status epilepticus; s/p burst suppression therapy-MRI without evidence of PRES.  Acute hypoxic respiratory failure- due to inability to handle secretions, aspiration, volume overload Circulatory shock. W/ symptomatic bradycardia during HD.  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  He was intolerant of dialysis, and based on our discussions yesterday we will not try this again after 2 failed attempts.  Unfortunately this is not something Mr. Slagel will survive and based on long discussions with family we will eventually transition to extubation and with plan for comfort only   Plan  Continue current level of pressors, no escalation  Full DO NOT RESUSCITATE  Continuing nutritional support for now  We will extubate with as needed morphine once all family is present  No change in AEDs  Best practice:  Diet: Tube feeds Pain/Anxiety/Delirium protocol (if indicated): none VAP protocol (if indicated): yes DVT prophylaxis: SQH GI prophylaxis: PPI Glucose control: SSI  + levemir  Mobility: BR Code Status: DNR  Erick Colace ACNP-BC Erick Pager # [phone] OR # [phone] if no answer

## 2020-05-27 NOTE — Progress Notes (Signed)
Note that pt is now on comfort care, will follow from a distance.    Kelly Splinter, MD 05/23/20, 11:51 AM

## 2020-05-27 DEATH — deceased
